# Patient Record
Sex: Male | Born: 1980
Health system: Southern US, Community
[De-identification: ages and names within clinical notes are randomized; demographics above are authoritative.]

## PROBLEM LIST (undated history)

## (undated) DIAGNOSIS — R51 Headache: Secondary | ICD-10-CM

## (undated) DIAGNOSIS — K219 Gastro-esophageal reflux disease without esophagitis: Secondary | ICD-10-CM

## (undated) DIAGNOSIS — D649 Anemia, unspecified: Secondary | ICD-10-CM

## (undated) DIAGNOSIS — N189 Chronic kidney disease, unspecified: Secondary | ICD-10-CM

## (undated) DIAGNOSIS — E1110 Type 2 diabetes mellitus with ketoacidosis without coma: Secondary | ICD-10-CM

## (undated) DIAGNOSIS — L0390 Cellulitis, unspecified: Secondary | ICD-10-CM

## (undated) DIAGNOSIS — E109 Type 1 diabetes mellitus without complications: Secondary | ICD-10-CM

## (undated) DIAGNOSIS — K759 Inflammatory liver disease, unspecified: Secondary | ICD-10-CM

## (undated) DIAGNOSIS — F419 Anxiety disorder, unspecified: Secondary | ICD-10-CM

## (undated) DIAGNOSIS — F1910 Other psychoactive substance abuse, uncomplicated: Secondary | ICD-10-CM

## (undated) DIAGNOSIS — J189 Pneumonia, unspecified organism: Secondary | ICD-10-CM

## (undated) DIAGNOSIS — A4902 Methicillin resistant Staphylococcus aureus infection, unspecified site: Secondary | ICD-10-CM

## (undated) DIAGNOSIS — M726 Necrotizing fasciitis: Secondary | ICD-10-CM

## (undated) DIAGNOSIS — I10 Essential (primary) hypertension: Secondary | ICD-10-CM

## (undated) DIAGNOSIS — E039 Hypothyroidism, unspecified: Secondary | ICD-10-CM

## (undated) DIAGNOSIS — A419 Sepsis, unspecified organism: Secondary | ICD-10-CM

## (undated) DIAGNOSIS — R011 Cardiac murmur, unspecified: Secondary | ICD-10-CM

## (undated) DIAGNOSIS — E119 Type 2 diabetes mellitus without complications: Secondary | ICD-10-CM

## (undated) DIAGNOSIS — R569 Unspecified convulsions: Secondary | ICD-10-CM

## (undated) HISTORY — DX: Other psychoactive substance abuse, uncomplicated: F19.10

## (undated) HISTORY — DX: Anemia, unspecified: D64.9

## (undated) HISTORY — PX: IRRIGATION AND DEBRIDEMENT ABSCESS: SHX5252

---

## 2009-03-19 DIAGNOSIS — R569 Unspecified convulsions: Secondary | ICD-10-CM

## 2009-03-19 HISTORY — DX: Unspecified convulsions: R56.9

## 2011-03-20 DIAGNOSIS — F1910 Other psychoactive substance abuse, uncomplicated: Secondary | ICD-10-CM

## 2011-03-20 HISTORY — DX: Other psychoactive substance abuse, uncomplicated: F19.10

## 2012-01-04 ENCOUNTER — Emergency Department (HOSPITAL_COMMUNITY)
Admission: EM | Admit: 2012-01-04 | Discharge: 2012-01-04 | Disposition: A | Discharge status: Home / Self Care | Payer: BC Managed Care – PPO | Attending: Emergency Medicine | Admitting: Emergency Medicine

## 2012-01-04 ENCOUNTER — Encounter (HOSPITAL_COMMUNITY): Payer: Self-pay

## 2012-01-04 DIAGNOSIS — IMO0002 Reserved for concepts with insufficient information to code with codable children: Secondary | ICD-10-CM | POA: Insufficient documentation

## 2012-01-04 DIAGNOSIS — F1910 Other psychoactive substance abuse, uncomplicated: Secondary | ICD-10-CM | POA: Insufficient documentation

## 2012-01-04 DIAGNOSIS — F17200 Nicotine dependence, unspecified, uncomplicated: Secondary | ICD-10-CM | POA: Insufficient documentation

## 2012-01-04 DIAGNOSIS — Z794 Long term (current) use of insulin: Secondary | ICD-10-CM | POA: Insufficient documentation

## 2012-01-04 DIAGNOSIS — R739 Hyperglycemia, unspecified: Secondary | ICD-10-CM

## 2012-01-04 DIAGNOSIS — E119 Type 2 diabetes mellitus without complications: Secondary | ICD-10-CM | POA: Insufficient documentation

## 2012-01-04 DIAGNOSIS — L02413 Cutaneous abscess of right upper limb: Secondary | ICD-10-CM

## 2012-01-04 HISTORY — DX: Type 2 diabetes mellitus without complications: E11.9

## 2012-01-04 LAB — GLUCOSE, CAPILLARY

## 2012-01-04 MED ORDER — OXYCODONE-ACETAMINOPHEN 5-325 MG PO TABS
1.0000 | ORAL_TABLET | Freq: Once | ORAL | Status: AC
  Administered 2012-01-04: 1 via ORAL
  Filled 2012-01-04: qty 1

## 2012-01-04 MED ORDER — CLINDAMYCIN HCL 150 MG PO CAPS: 300.0000 mg | ORAL_CAPSULE | Freq: Four times a day (QID) | ORAL | Status: DC

## 2012-01-04 MED ORDER — CLINDAMYCIN HCL 150 MG PO CAPS
300.0000 mg | ORAL_CAPSULE | Freq: Once | ORAL | Status: AC
  Administered 2012-01-04: 300 mg via ORAL
  Filled 2012-01-04 (×2): qty 1

## 2012-01-04 NOTE — ED Provider Notes (Addendum)
History  This chart was scribed for Luke Norrie, MD by Lovena Le Day. This patient was seen in room TR09C/TR09C and the patient's care was started at 1207.   CSN: [id number]  Arrival date & time 01/04/12  1207   None     Chief Complaint  Patient presents with  . Abscess   The history is provided by the patient. No language interpreter was used.   Luke [name] is a 31 y.o. male who presents to the Emergency Department complaining of constant gradually worsening abscess to right forearm without any drainage associated with shooting opana into his right AC last weekend ago with swelling which began 2 days after. He denies fever or chills, chest pain, SOB. He has been taking opana for 4-5 months for back problems. PT states he got drunk at his 31 yo GM's birthday and "people were having fun".  States his wife is a Lawyer and his mother is a Marine scientist.   His PCP is Dr. Harrie Foreman at [facility]   Past Medical History  Diagnosis Date  . Diabetes mellitus without complication     History reviewed. No pertinent past surgical history.  History reviewed. No pertinent family history.  History  Substance Use Topics  . Smoking status: Current Every Day Smoker -- 1.0 packs/day  . Smokeless tobacco: Not on file  . Alcohol Use: Yes   On disability   Review of Systems  Constitutional: Negative for fever and chills.  Respiratory: Negative for shortness of breath.   Cardiovascular: Negative for chest pain.  Gastrointestinal: Negative for nausea and vomiting.  Skin:       Abscess right forearm, swelling, redness, no drainage  Neurological: Negative for weakness.  All other systems reviewed and are negative.    Allergies  Review of patient's allergies indicates no known allergies.  Home Medications   Current Outpatient Rx  Name Route Sig Dispense Refill  . ALPRAZOLAM 2 MG PO TABS Oral Take 3 mg by mouth at bedtime.    . IBUPROFEN 200 MG PO TABS Oral Take 600 mg by mouth  every 6 (six) hours as needed. For pain    . INSULIN ASPART 100 UNIT/ML [place] SOLN Subcutaneous Inject 5 Units into the skin 3 (three) times daily as needed. For high cbg    . INSULIN ASPART PROT & ASPART (70-30) 100 UNIT/ML [place] SUSP Subcutaneous Inject 40-50 Units into the skin 2 (two) times daily. 50 units in the morning and 40 units at night    . OXYMORPHONE HCL ER 20 MG PO TB12 Oral Take 20 mg by mouth every 12 (twelve) hours.      Triage vitals: BP 145/89  Pulse 100  Temp 98.9 F (37.2 C) (Oral)  Resp 18  Ht 5\' 9"  (1.753 m)  Wt 190 lb (86.183 kg)  BMI 28.06 kg/m2  SpO2 100%  Vital signs normal    Physical Exam  Nursing note and vitals reviewed. Constitutional: He is oriented to person, place, and time. He appears well-developed and well-nourished.  Non-toxic appearance. He does not appear ill. No distress.  HENT:  Head: Normocephalic and atraumatic.  Right Ear: External ear normal.  Left Ear: External ear normal.  Nose: Nose normal. No mucosal edema or rhinorrhea.  Mouth/Throat: Oropharynx is clear and moist and mucous membranes are normal. No dental abscesses or uvula swelling.  Eyes: Conjunctivae normal and EOM are normal. Pupils are equal, round, and reactive to light.  Neck: Normal range of motion and  full passive range of motion without pain. Neck supple.  Cardiovascular: Normal rate, regular rhythm, normal heart sounds and intact distal pulses.  Exam reveals no gallop and no friction rub.   No murmur heard.      NO MURMERS!  Pulmonary/Chest: Effort normal. No respiratory distress. He has no wheezes. He has no rhonchi. He has no rales. He exhibits no crepitus.  Abdominal: Normal appearance.  Musculoskeletal: Normal range of motion. He exhibits no edema and no tenderness.       Moves all extremities well.   Neurological: He is alert and oriented to person, place, and time. He has normal strength. No cranial nerve deficit.  Skin: Skin is warm, dry and intact. No rash noted.  There is erythema. No pallor.       3x4 cm swollen, red, raised, erythematous area over proximal right forearm volar aspect.  Pt has track marks that appear scarred in the same area as well as fresh needle marks in his antecubital region.    Psychiatric: He has a normal mood and affect. His speech is normal and behavior is normal. His mood appears not anxious.    ED Course  Procedures (including critical care time)  Medications  oxyCODONE-acetaminophen (PERCOCET/ROXICET) 5-325 MG per tablet 1 tablet (not administered)  clindamycin (CLEOCIN) capsule 300 mg (not administered)    DIAGNOSTIC STUDIES: Oxygen Saturation is 100% on room air, normal by my interpretation.    COORDINATION OF CARE: At 51 PM Discussed treatment plan with patient which includes CBG, I&D right forearm. Patient agrees.  Pt swears he isn't going to do this (IV drugs)  anymore.  INCISION AND DRAINAGE Performed by: Rolland Porter L Consent: Verbal consent obtained. Risks and benefits: risks, benefits and alternatives were discussed Type: abscess  Body area: right forearm  Anesthesia: local infiltration  Local anesthetic: lidocaine 2%+  1 epinephrine  Anesthetic total: 3 ml  Complexity: complex Blunt dissection to break up loculations  Drainage: purulent  Drainage amount: moderate  Packing material: 1/4 in iodoform gauze small amount to keep wound open  Patient tolerance: Patient tolerated the procedure well with no immediate complications.    Review of [place] controlled substance site verified patient has been on Opana since May prescribed by Dr. Carolyne Fiscal. He gets 60 tablets monthly of the ER 20 mg tablets   Labs Reviewed  GLUCOSE, CAPILLARY - Abnormal; Notable for the following:    Glucose-Capillary 309 (*)     All other components within normal limits   Laboratory interpretation all normal except for hyperglycemia, patient states he is normally in this range. He states all last year he  was in the 600s, he states his doctor doesn't want him any lower than the 200s.   1. Abscess of forearm, right   2. IV drug abuse   3. Hyperglycemia    New Prescriptions   CLINDAMYCIN (CLEOCIN) 150 MG CAPSULE    Take 2 capsules (300 mg total) by mouth 4 (four) times daily.    Plan discharge  Rolland Porter, MD, FACEP    MDM  I personally performed the services described in this documentation, which was scribed in my presence. The recorded information has been reviewed and considered.  Rolland Porter, MD, FACEP          Luke Norrie, MD 01/04/12 [place] Arif Amendola, MD 01/04/12 [id number]

## 2012-01-04 NOTE — ED Notes (Signed)
Pt presents with 1 week h/o abscess to R forearm.  Pt denies any drainage to area.

## 2012-04-28 ENCOUNTER — Emergency Department (HOSPITAL_COMMUNITY)
Admission: EM | Admit: 2012-04-28 | Discharge: 2012-04-28 | Disposition: A | Discharge status: Home / Self Care | Payer: Self-pay | Source: Home / Self Care | Attending: Family Medicine | Admitting: Family Medicine

## 2012-04-28 ENCOUNTER — Encounter (HOSPITAL_COMMUNITY): Payer: Self-pay

## 2012-04-28 DIAGNOSIS — E109 Type 1 diabetes mellitus without complications: Secondary | ICD-10-CM

## 2012-04-28 DIAGNOSIS — Z720 Tobacco use: Secondary | ICD-10-CM

## 2012-04-28 DIAGNOSIS — E1140 Type 2 diabetes mellitus with diabetic neuropathy, unspecified: Secondary | ICD-10-CM

## 2012-04-28 NOTE — ED Provider Notes (Signed)
History   CSN: [id number]  Arrival date & time 04/28/12  1728   First MD Initiated Contact with Patient 04/28/12 1740     Chief Complaint  Patient presents with  . Diabetes   HPI Pt says that he take 50 units of novolog 70/30 at breakfast and 40 units with supper, and novolog sliding scale reporting that he takes 5 units before meals.  Pt says that he has been going to [facility].  Pt says that he has been getting his pain and anxiety medications from that clinic.   Pt says that he does not have any testing strips for his diabetes medications.  Pt says that he takes his regular doses and estimates how much sliding scale insulin to take based on how he feels.   Pt says that his A1c is 13.4%. Pt says that he is here to get a discount card so that he can have assistance with getting his medications.      Past Medical History  Diagnosis Date  . Diabetes mellitus without complication     History reviewed. No pertinent past surgical history.  Family history Diabetes Hypertension Cancer - lung Maternal Grandparents Heart Disease  Dad died at age 27 of massive Heart Attack  History  Substance Use Topics  . Smoking status: Current Every Day Smoker -- 1.00 packs/day  . Smokeless tobacco: Not on file  . Alcohol Use: Yes    Review of Systems  Genitourinary: Positive for urgency and frequency.       Nocturia   All other systems reviewed and are negative.    Allergies  Review of patient's allergies indicates no known allergies.  Home Medications   Current Outpatient Rx  Name  Route  Sig  Dispense  Refill  . alprazolam (XANAX) 2 MG tablet   Oral   Take 3 mg by mouth at bedtime.         . clindamycin (CLEOCIN) 150 MG capsule   Oral   Take 2 capsules (300 mg total) by mouth 4 (four) times daily.   80 capsule   0   . ibuprofen (ADVIL,MOTRIN) 200 MG tablet   Oral   Take 600 mg by mouth every 6 (six) hours as needed. For pain         . insulin aspart (NOVOLOG) 100  UNIT/ML injection   Subcutaneous   Inject 5 Units into the skin 3 (three) times daily as needed. For high cbg         . insulin aspart protamine-insulin aspart (NOVOLOG 70/30) (70-30) 100 UNIT/ML injection   Subcutaneous   Inject 40-50 Units into the skin 2 (two) times daily. 50 units in the morning and 40 units at night         . oxymorphone (OPANA ER) 20 MG 12 hr tablet   Oral   Take 20 mg by mouth every 12 (twelve) hours.           BP 162/94  Pulse 107  Temp(Src) 97.7 F (36.5 C) (Oral)  Physical Exam  Nursing note and vitals reviewed. Constitutional: He is oriented to person, place, and time. He appears well-developed and well-nourished. No distress.  HENT:  Head: Normocephalic and atraumatic.  Right Ear: External ear normal.  Left Ear: External ear normal.  Nose: Mucosal edema present.  Mouth/Throat: Abnormal dentition. Dental caries present.  Eyes: Conjunctivae and EOM are normal. Pupils are equal, round, and reactive to light.  Neck: Normal range of motion. Neck supple. No JVD  present. No tracheal deviation present. No thyromegaly present.  Cardiovascular: Normal rate, regular rhythm and normal heart sounds.   No murmur heard. Pulmonary/Chest: Effort normal and breath sounds normal.  Abdominal: Soft. Bowel sounds are normal. He exhibits no distension and no mass. There is no tenderness. There is no rebound and no guarding.  Musculoskeletal: Normal range of motion. He exhibits no edema and no tenderness.  Lymphadenopathy:    He has no cervical adenopathy.  Neurological: He is alert and oriented to person, place, and time.  Skin: Skin is warm and dry. No rash noted. No erythema. No pallor.  Psychiatric: He has a normal mood and affect. His behavior is normal. Judgment and thought content normal.    ED Course  Procedures (including critical care time)  Labs Reviewed - No data to display No results found.   No diagnosis found.  MDM   IMPRESSION  Uncontrolled Diabetes Mellitus, type 1, poorly controlled  Chronic Active Nicotine Dependence  Chronic Pain Disorder  Anxiety Disorder  Elevated BP   History of recreational drug use    RECOMMENDATIONS / PLAN Check labs today.  I had a discussion with patient today.  He is at [place] of ACUTE and CHRONIC complications of poorly controlled diabetes mellitus. The patient verbalized understanding.   The patient was counseled on the dangers of tobacco use, and was advised to quit.  Reviewed strategies to maximize success, including removing cigarettes and smoking materials from environment, stress management, substitution of other forms of reinforcement and support of family/friends and written materials given. Strongly encouraged pt to get a discount card so that he can get his insulin and testing strips. Follow up in 2 weeks for BP checks Pt says that he has refills on his Novolog 70/30 insulin and did not want to get his labs done today.  The patient says that he will RTC in 2 weeks to have his labs done.  I encouraged patient to get Relion Meter and Test Strips from [employer] and to start testing his BS 4 times per day.  The patient verbalized understanding.  Obtain Old Records.  Pt says that he would prefer to go back to his previous clinic because they were providing him with his pain medications.     FOLLOW UP 2 weeks for BP check up   The patient was given clear instructions to go to ER or return to medical center if symptoms don't improve, worsen or new problems develop.  The patient verbalized understanding.  The patient was told to call to get lab results if they haven't heard anything in the next week.            Murlean Iba, MD 04/28/12 [phone]

## 2012-04-28 NOTE — ED Notes (Signed)
Patient states is a diabetic and needs insulin

## 2012-10-10 ENCOUNTER — Encounter (HOSPITAL_COMMUNITY): Payer: Self-pay | Admitting: *Deleted

## 2012-10-10 ENCOUNTER — Emergency Department (HOSPITAL_COMMUNITY)
Admission: EM | Admit: 2012-10-10 | Discharge: 2012-10-10 | Discharge status: Against Medical Advice | Payer: Medicaid Other | Attending: Emergency Medicine | Admitting: Emergency Medicine

## 2012-10-10 DIAGNOSIS — M3313 Other dermatomyositis without myopathy: Secondary | ICD-10-CM | POA: Diagnosis present

## 2012-10-10 DIAGNOSIS — R61 Generalized hyperhidrosis: Secondary | ICD-10-CM | POA: Insufficient documentation

## 2012-10-10 DIAGNOSIS — R0789 Other chest pain: Secondary | ICD-10-CM | POA: Insufficient documentation

## 2012-10-10 DIAGNOSIS — R110 Nausea: Secondary | ICD-10-CM | POA: Insufficient documentation

## 2012-10-10 DIAGNOSIS — Z79899 Other long term (current) drug therapy: Secondary | ICD-10-CM | POA: Insufficient documentation

## 2012-10-10 DIAGNOSIS — Z794 Long term (current) use of insulin: Secondary | ICD-10-CM | POA: Insufficient documentation

## 2012-10-10 DIAGNOSIS — F1410 Cocaine abuse, uncomplicated: Secondary | ICD-10-CM | POA: Diagnosis present

## 2012-10-10 DIAGNOSIS — F17200 Nicotine dependence, unspecified, uncomplicated: Secondary | ICD-10-CM | POA: Insufficient documentation

## 2012-10-10 DIAGNOSIS — B9689 Other specified bacterial agents as the cause of diseases classified elsewhere: Secondary | ICD-10-CM | POA: Diagnosis present

## 2012-10-10 DIAGNOSIS — R509 Fever, unspecified: Secondary | ICD-10-CM | POA: Insufficient documentation

## 2012-10-10 DIAGNOSIS — E119 Type 2 diabetes mellitus without complications: Secondary | ICD-10-CM | POA: Insufficient documentation

## 2012-10-10 DIAGNOSIS — IMO0002 Reserved for concepts with insufficient information to code with codable children: Principal | ICD-10-CM | POA: Diagnosis present

## 2012-10-10 DIAGNOSIS — L0291 Cutaneous abscess, unspecified: Secondary | ICD-10-CM

## 2012-10-10 DIAGNOSIS — L0390 Cellulitis, unspecified: Secondary | ICD-10-CM | POA: Diagnosis present

## 2012-10-10 DIAGNOSIS — N179 Acute kidney failure, unspecified: Secondary | ICD-10-CM | POA: Diagnosis present

## 2012-10-10 DIAGNOSIS — E871 Hypo-osmolality and hyponatremia: Secondary | ICD-10-CM | POA: Diagnosis present

## 2012-10-10 DIAGNOSIS — M3390 Dermatopolymyositis, unspecified, organ involvement unspecified: Secondary | ICD-10-CM | POA: Diagnosis present

## 2012-10-10 DIAGNOSIS — M2559 Pain in other specified joint: Secondary | ICD-10-CM | POA: Insufficient documentation

## 2012-10-10 DIAGNOSIS — R21 Rash and other nonspecific skin eruption: Secondary | ICD-10-CM | POA: Insufficient documentation

## 2012-10-10 DIAGNOSIS — I10 Essential (primary) hypertension: Secondary | ICD-10-CM | POA: Diagnosis present

## 2012-10-10 MED ORDER — SODIUM CHLORIDE 0.9 % IV BOLUS (SEPSIS): 1000.0000 mL | Freq: Once | INTRAVENOUS | Status: DC

## 2012-10-10 NOTE — ED Notes (Signed)
Pt reports having abscess to left arm for extended amount of time but it became more severe this week. Has redness and swelling noted to forearm and bend of left arm.

## 2012-10-10 NOTE — ED Provider Notes (Signed)
CSN: [id number]     Arrival date & time 10/10/12  1759 History     First MD Initiated Contact with Patient 10/10/12 2105     Chief Complaint  Patient presents with  . Abscess   (Consider location/radiation/quality/duration/timing/severity/associated sxs/prior Treatment) HPI Luke [name] 32 y.o. who is a known IV drug abuser and a poorly controlled diabetic presents emergency department for left-sided forearm pain and swelling. He describes it as "I have an abscess". It's associated with surrounding redness and warmth. It started approximately 2 weeks ago. Patient reports relapsing with IV drugs around the same time. Pain is been worsening. He denies limited range of motion of his elbow due to pain. He now notes tightness within his forearm as well. This is associated with a fever documented 4 days ago of 104F. Patient is also had chills, and diaphoresis. He reports having abscesses in the past, but denies needing hospitalization for these. He has not sought medical care up to this point and time. He denies chest pain, shortness of breath, headache, syncope.  Past Medical History  Diagnosis Date  . Diabetes mellitus without complication    History reviewed. No pertinent past surgical history. History reviewed. No pertinent family history. History  Substance Use Topics  . Smoking status: Current Every Day Smoker -- 1.00 packs/day  . Smokeless tobacco: Not on file  . Alcohol Use: Yes    Review of Systems  Constitutional: Positive for fever, chills and diaphoresis. Negative for activity change, appetite change and fatigue.  HENT: Negative for congestion, sore throat, rhinorrhea, sneezing and trouble swallowing.   Eyes: Negative for pain and redness.  Respiratory: Positive for chest tightness. Negative for cough, choking, shortness of breath, wheezing and stridor.   Cardiovascular: Positive for chest pain. Negative for leg swelling.  Gastrointestinal: Positive for nausea. Negative for  vomiting, diarrhea, constipation, blood in stool, abdominal distention and anal bleeding.  Musculoskeletal: Positive for arthralgias (Left elbow). Negative for myalgias and back pain.  Skin: Positive for rash (left proximal for).  Neurological: Negative for dizziness, speech difficulty, weakness, light-headedness, numbness and headaches.  Hematological: Negative for adenopathy.  Psychiatric/Behavioral: Negative for confusion.    Allergies  Review of patient's allergies indicates no known allergies.  Home Medications   Current Outpatient Rx  Name  Route  Sig  Dispense  Refill  . alprazolam (XANAX) 2 MG tablet   Oral   Take 3 mg by mouth at bedtime.         Marland Kitchen ibuprofen (ADVIL,MOTRIN) 200 MG tablet   Oral   Take 600 mg by mouth every 6 (six) hours as needed. For pain         . insulin aspart (NOVOLOG) 100 UNIT/ML injection   Subcutaneous   Inject 5 Units into the skin 3 (three) times daily as needed for high blood sugar (pt on a sliding scale).          . insulin aspart protamine-insulin aspart (NOVOLOG 70/30) (70-30) 100 UNIT/ML injection   Subcutaneous   Inject 40-50 Units into the skin 2 (two) times daily. 55 units in the morning and 45 units at night         . lisinopril (PRINIVIL,ZESTRIL) 10 MG tablet   Oral   Take 10 mg by mouth daily.         Marland Kitchen oxymorphone (OPANA ER) 20 MG 12 hr tablet   Oral   Take 20 mg by mouth every 12 (twelve) hours.  BP 141/73  Pulse 120  Temp(Src) 100.1 F (37.8 C) (Oral)  Resp 18  SpO2 98% Physical Exam  Nursing note and vitals reviewed. Constitutional: He is oriented to person, place, and time. He appears well-developed and well-nourished. No distress.  HENT:  Head: Normocephalic and atraumatic.  Eyes: Conjunctivae and EOM are normal. Right eye exhibits no discharge. Left eye exhibits no discharge.  Neck: Normal range of motion. Neck supple. No tracheal deviation present.  Cardiovascular: Regular rhythm and normal  heart sounds.  Tachycardia present.  Exam reveals no friction rub.   No murmur heard. Pulmonary/Chest: Effort normal and breath sounds normal. No stridor. No respiratory distress. He has no wheezes. He has no rales. He exhibits no tenderness.  Abdominal: Soft. He exhibits no distension. There is no tenderness. There is no rebound and no guarding.  Musculoskeletal:       Left elbow: He exhibits decreased range of motion. He exhibits no laceration. No tenderness found. No radial head, no medial epicondyle, no lateral epicondyle and no olecranon process tenderness noted.       Arms: There is a large indurated area that is erythematous on the proximal aspect of the anterior forearm. It is at least 15 cm. It is tender to palpation. There is surrounding erythema extending proximally and distally. There is tenderness palpation of the entire forearm. Can only minimally grip due to pain.  Neurological: He is alert and oriented to person, place, and time.  Skin: Skin is warm. He is diaphoretic. There is erythema (There is a large indurated area that is erythematous on the proximal aspect of the anterior forearm. It is at least 15 cm. It is tender to palpation. There is surrounding erythema extending proximally and distally. forearm compartment is tight. . it is war).  Psychiatric: He has a normal mood and affect.    ED Course   Procedures (including critical care time)  No diagnosis found.  MDM  Luke [name] 32 y.o. presents with appears to be an abscess underlying cellulitis, and also some concern for compartment syndrome of his left forearm. He is a known IV drug abuser and a poorly controlled diabetic. This is also associated with fever, chills, diaphoresis. I'm very concerned for abscess underlying cellulitis, but cannot rule out necrotizing fasciitis versus a septic joint versus sepsis. I told the patient at bedside bed admission would be required for IV antibiotics and patient indicated that he  needed to go home to put some orders and affairs he has 5 children and wife. He specifically said he needs to give the car back to his wife. I explained to the patient in detail the necessity of urgent IV antibiotics in the potential risks of not having IV antibiotics. These risks included worsening infection, infection in the joint leading to permanent destruction of the joint, sepsis, endocarditis, necrotizing fasciitis which could lead to require surgical debridement, permanent disability, or death. The patient understood these risks, and indicated he would return at later time.  I discussed this patient's care with my attending, Dr. Kathrynn Humble.  Kelby Aline, MD 10/11/12 [id number]  Kelby Aline, MD 10/11/12 0028  I performed a history and physical examination of  Luke [name] and discussed his management with Dr. Lucrezia Starch. I agree with the history, physical, assessment, and plan of care, with the following exceptions: None I was present for the following procedures: None  Time Spent in Critical Care of the patient: None  Time spent in discussions with the patient and family:  20 minutes  Naje Rice  Pt with suspected abscess of the forearm. Wants to go home, and return immediately.  Patient wants to leave against medical advice. Patient understands that his/her actions will lead to inadequate medical workup, and that he/she is at risk of complications of missed diagnosis, which includes morbidity and mortality.  Patient is demonstrating good capacity to make decision. Patient understands that he/she needs to return to the ER immediately if his/her symptoms get worse.    Varney Biles, MD 10/13/12 [phone]

## 2012-10-11 ENCOUNTER — Inpatient Hospital Stay (HOSPITAL_COMMUNITY): Payer: Medicaid Other | Admitting: Anesthesiology

## 2012-10-11 ENCOUNTER — Encounter (HOSPITAL_COMMUNITY): Payer: Self-pay | Admitting: Anesthesiology

## 2012-10-11 ENCOUNTER — Encounter (HOSPITAL_COMMUNITY): Payer: Self-pay | Admitting: *Deleted

## 2012-10-11 ENCOUNTER — Encounter (HOSPITAL_COMMUNITY)
Admission: EM | Discharge status: Against Medical Advice | Payer: Self-pay | Source: Home / Self Care | Attending: Internal Medicine

## 2012-10-11 ENCOUNTER — Inpatient Hospital Stay (HOSPITAL_COMMUNITY)
Admission: EM | Admit: 2012-10-11 | Discharge: 2012-10-13 | DRG: 580 | Discharge status: Against Medical Advice | Payer: Medicaid Other | Attending: Internal Medicine | Admitting: Internal Medicine

## 2012-10-11 DIAGNOSIS — L0390 Cellulitis, unspecified: Secondary | ICD-10-CM | POA: Diagnosis present

## 2012-10-11 DIAGNOSIS — M3390 Dermatopolymyositis, unspecified, organ involvement unspecified: Secondary | ICD-10-CM

## 2012-10-11 DIAGNOSIS — N179 Acute kidney failure, unspecified: Secondary | ICD-10-CM

## 2012-10-11 DIAGNOSIS — L02414 Cutaneous abscess of left upper limb: Secondary | ICD-10-CM

## 2012-10-11 DIAGNOSIS — I10 Essential (primary) hypertension: Secondary | ICD-10-CM | POA: Diagnosis present

## 2012-10-11 DIAGNOSIS — Z720 Tobacco use: Secondary | ICD-10-CM | POA: Diagnosis present

## 2012-10-11 DIAGNOSIS — L0291 Cutaneous abscess, unspecified: Secondary | ICD-10-CM | POA: Diagnosis present

## 2012-10-11 DIAGNOSIS — F1910 Other psychoactive substance abuse, uncomplicated: Secondary | ICD-10-CM | POA: Diagnosis present

## 2012-10-11 HISTORY — PX: I & D EXTREMITY: SHX5045

## 2012-10-11 LAB — GLUCOSE, CAPILLARY
Glucose-Capillary: 306 mg/dL — ABNORMAL HIGH (ref 70–99)
Glucose-Capillary: 258 mg/dL — ABNORMAL HIGH (ref 70–99)
Glucose-Capillary: 288 mg/dL — ABNORMAL HIGH (ref 70–99)

## 2012-10-11 LAB — POCT I-STAT, CHEM 8
BUN: 15 mg/dL (ref 6–23)
Chloride: 97 mEq/L (ref 96–112)
Creatinine, Ser: 1.6 mg/dL — ABNORMAL HIGH (ref 0.50–1.35)
Potassium: 3.9 mEq/L (ref 3.5–5.1)
Sodium: 131 mEq/L — ABNORMAL LOW (ref 135–145)

## 2012-10-11 LAB — CBC WITH DIFFERENTIAL/PLATELET
Basophils Relative: 0 % (ref 0–1)
Eosinophils Relative: 0 % (ref 0–5)
Hemoglobin: 13.1 g/dL (ref 13.0–17.0)
Lymphocytes Relative: 10 % — ABNORMAL LOW (ref 12–46)
MCH: 30.2 pg (ref 26.0–34.0)
Monocytes Relative: 7 % (ref 3–12)
Neutrophils Relative %: 83 % — ABNORMAL HIGH (ref 43–77)
RBC: 4.34 MIL/uL (ref 4.22–5.81)

## 2012-10-11 SURGERY — IRRIGATION AND DEBRIDEMENT EXTREMITY
Anesthesia: General | Site: Arm Lower | Laterality: Left | Wound class: Dirty or Infected

## 2012-10-11 MED ORDER — ALPRAZOLAM 1 MG PO TABS
1.0000 mg | ORAL_TABLET | Freq: Two times a day (BID) | ORAL | Status: DC | PRN
  Administered 2012-10-12: 2 mg via ORAL
  Filled 2012-10-11: qty 4

## 2012-10-11 MED ORDER — HYDROMORPHONE HCL PF 2 MG/ML IJ SOLN
2.0000 mg | INTRAMUSCULAR | Status: AC
  Administered 2012-10-11: 2 mg via INTRAVENOUS
  Filled 2012-10-11: qty 1

## 2012-10-11 MED ORDER — PROMETHAZINE HCL 25 MG/ML IJ SOLN
INTRAMUSCULAR | Status: AC
  Filled 2012-10-11: qty 1

## 2012-10-11 MED ORDER — MIDAZOLAM HCL 5 MG/5ML IJ SOLN
INTRAMUSCULAR | Status: DC | PRN
  Administered 2012-10-11: 2 mg via INTRAVENOUS

## 2012-10-11 MED ORDER — VANCOMYCIN HCL IN DEXTROSE 1-5 GM/200ML-% IV SOLN
1000.0000 mg | Freq: Three times a day (TID) | INTRAVENOUS | Status: DC
  Administered 2012-10-11 – 2012-10-13 (×6): 1000 mg via INTRAVENOUS
  Filled 2012-10-11 (×9): qty 200

## 2012-10-11 MED ORDER — PROMETHAZINE HCL 25 MG/ML IJ SOLN
6.2500 mg | INTRAMUSCULAR | Status: DC | PRN
  Administered 2012-10-11: 12.5 mg via INTRAVENOUS

## 2012-10-11 MED ORDER — SODIUM CHLORIDE 0.9 % IR SOLN
Status: DC | PRN
  Administered 2012-10-11: 11:00:00

## 2012-10-11 MED ORDER — ONDANSETRON HCL 4 MG PO TABS: 4.0000 mg | ORAL_TABLET | Freq: Four times a day (QID) | ORAL | Status: DC | PRN

## 2012-10-11 MED ORDER — SUCCINYLCHOLINE CHLORIDE 20 MG/ML IJ SOLN
INTRAMUSCULAR | Status: DC | PRN
  Administered 2012-10-11: 100 mg via INTRAVENOUS

## 2012-10-11 MED ORDER — HYDROMORPHONE HCL PF 1 MG/ML IJ SOLN
INTRAMUSCULAR | Status: AC
  Filled 2012-10-11: qty 2

## 2012-10-11 MED ORDER — ACETAMINOPHEN 500 MG PO TABS
500.0000 mg | ORAL_TABLET | Freq: Once | ORAL | Status: AC
  Administered 2012-10-11: 500 mg via ORAL
  Filled 2012-10-11: qty 1

## 2012-10-11 MED ORDER — SODIUM CHLORIDE 0.9 % IV BOLUS (SEPSIS)
1000.0000 mL | Freq: Once | INTRAVENOUS | Status: AC
  Administered 2012-10-11: 1000 mL via INTRAVENOUS

## 2012-10-11 MED ORDER — FENTANYL CITRATE 0.05 MG/ML IJ SOLN
INTRAMUSCULAR | Status: DC | PRN
  Administered 2012-10-11: 100 ug via INTRAVENOUS
  Administered 2012-10-11 (×3): 50 ug via INTRAVENOUS

## 2012-10-11 MED ORDER — INSULIN ASPART PROT & ASPART (70-30 MIX) 100 UNIT/ML ~~LOC~~ SUSP
55.0000 [IU] | Freq: Every day | SUBCUTANEOUS | Status: DC
  Administered 2012-10-12: 55 [IU] via SUBCUTANEOUS
  Filled 2012-10-11: qty 10

## 2012-10-11 MED ORDER — CHLORHEXIDINE GLUCONATE 4 % EX LIQD: 60.0000 mL | Freq: Once | CUTANEOUS | Status: DC

## 2012-10-11 MED ORDER — INSULIN ASPART PROT & ASPART (70-30 MIX) 100 UNIT/ML ~~LOC~~ SUSP
45.0000 [IU] | Freq: Every day | SUBCUTANEOUS | Status: DC
  Administered 2012-10-11 – 2012-10-12 (×2): 45 [IU] via SUBCUTANEOUS

## 2012-10-11 MED ORDER — INSULIN ASPART PROT & ASPART (70-30 MIX) 100 UNIT/ML ~~LOC~~ SUSP: 45.0000 [IU] | Freq: Two times a day (BID) | SUBCUTANEOUS | Status: DC

## 2012-10-11 MED ORDER — DEXTROSE-NACL 5-0.9 % IV SOLN
INTRAVENOUS | Status: DC
  Administered 2012-10-11: 13:00:00 via INTRAVENOUS

## 2012-10-11 MED ORDER — LACTATED RINGERS IV SOLN
INTRAVENOUS | Status: DC | PRN
  Administered 2012-10-11 (×2): via INTRAVENOUS

## 2012-10-11 MED ORDER — INSULIN ASPART 100 UNIT/ML ~~LOC~~ SOLN: 4.0000 [IU] | Freq: Three times a day (TID) | SUBCUTANEOUS | Status: DC

## 2012-10-11 MED ORDER — INSULIN ASPART 100 UNIT/ML ~~LOC~~ SOLN
0.0000 [IU] | Freq: Three times a day (TID) | SUBCUTANEOUS | Status: DC
  Administered 2012-10-11: 8 [IU] via SUBCUTANEOUS
  Administered 2012-10-12: 11 [IU] via SUBCUTANEOUS
  Administered 2012-10-12: 3 [IU] via SUBCUTANEOUS

## 2012-10-11 MED ORDER — ALPRAZOLAM 0.5 MG PO TABS
3.0000 mg | ORAL_TABLET | Freq: Every day | ORAL | Status: DC
  Administered 2012-10-11 – 2012-10-12 (×2): 3 mg via ORAL
  Filled 2012-10-11 (×2): qty 6

## 2012-10-11 MED ORDER — SODIUM CHLORIDE 0.9 % IR SOLN
Status: DC | PRN
  Administered 2012-10-11: 3000 mL

## 2012-10-11 MED ORDER — ONDANSETRON HCL 4 MG/2ML IJ SOLN: 4.0000 mg | Freq: Four times a day (QID) | INTRAMUSCULAR | Status: DC | PRN

## 2012-10-11 MED ORDER — CIPROFLOXACIN IN D5W 400 MG/200ML IV SOLN
400.0000 mg | Freq: Two times a day (BID) | INTRAVENOUS | Status: DC
  Administered 2012-10-11 – 2012-10-12 (×4): 400 mg via INTRAVENOUS
  Filled 2012-10-11 (×6): qty 200

## 2012-10-11 MED ORDER — HEPARIN SODIUM (PORCINE) 5000 UNIT/ML IJ SOLN
5000.0000 [IU] | Freq: Three times a day (TID) | INTRAMUSCULAR | Status: DC
  Administered 2012-10-11 – 2012-10-13 (×5): 5000 [IU] via SUBCUTANEOUS
  Filled 2012-10-11 (×9): qty 1

## 2012-10-11 MED ORDER — HYDROMORPHONE HCL PF 1 MG/ML IJ SOLN
1.0000 mg | INTRAMUSCULAR | Status: DC | PRN
  Administered 2012-10-11 (×2): 2 mg via INTRAVENOUS
  Administered 2012-10-11: 1 mg via INTRAVENOUS
  Administered 2012-10-11 (×2): 2 mg via INTRAVENOUS
  Administered 2012-10-11 (×2): 1 mg via INTRAVENOUS
  Administered 2012-10-12 – 2012-10-13 (×7): 2 mg via INTRAVENOUS
  Administered 2012-10-13: 1 mg via INTRAVENOUS
  Administered 2012-10-13 (×3): 2 mg via INTRAVENOUS
  Filled 2012-10-11 (×7): qty 2
  Filled 2012-10-11: qty 1
  Filled 2012-10-11 (×2): qty 2
  Filled 2012-10-11: qty 1
  Filled 2012-10-11: qty 2
  Filled 2012-10-11: qty 1
  Filled 2012-10-11: qty 2
  Filled 2012-10-11: qty 1
  Filled 2012-10-11 (×3): qty 2

## 2012-10-11 MED ORDER — STERILE DILUENT FOR HUMULIN INSULINS: 20.0000 [IU] | Freq: Two times a day (BID) | SUBCUTANEOUS | Status: DC

## 2012-10-11 MED ORDER — INSULIN ASPART 100 UNIT/ML ~~LOC~~ SOLN
0.0000 [IU] | SUBCUTANEOUS | Status: DC
Start: 2012-10-11 — End: 2012-10-11
  Administered 2012-10-11: 11 [IU] via SUBCUTANEOUS
  Administered 2012-10-11: 15 [IU] via SUBCUTANEOUS

## 2012-10-11 MED ORDER — INSULIN ASPART 100 UNIT/ML ~~LOC~~ SOLN
SUBCUTANEOUS | Status: AC
  Filled 2012-10-11: qty 1

## 2012-10-11 MED ORDER — KETOROLAC TROMETHAMINE 30 MG/ML IJ SOLN
30.0000 mg | Freq: Four times a day (QID) | INTRAMUSCULAR | Status: DC
  Administered 2012-10-11 – 2012-10-13 (×6): 30 mg via INTRAVENOUS
  Filled 2012-10-11 (×14): qty 1

## 2012-10-11 MED ORDER — PROPOFOL 10 MG/ML IV BOLUS
INTRAVENOUS | Status: DC | PRN
  Administered 2012-10-11: 200 mg via INTRAVENOUS

## 2012-10-11 MED ORDER — INSULIN NPH (HUMAN) (ISOPHANE) 100 UNIT/ML ~~LOC~~ SUSP
20.0000 [IU] | Freq: Two times a day (BID) | SUBCUTANEOUS | Status: DC
  Filled 2012-10-11: qty 10

## 2012-10-11 MED ORDER — LIDOCAINE HCL (CARDIAC) 20 MG/ML IV SOLN
INTRAVENOUS | Status: DC | PRN
  Administered 2012-10-11: 60 mg via INTRAVENOUS

## 2012-10-11 MED ORDER — PANTOPRAZOLE SODIUM 40 MG PO TBEC
40.0000 mg | DELAYED_RELEASE_TABLET | Freq: Every day | ORAL | Status: DC
  Administered 2012-10-11 – 2012-10-13 (×3): 40 mg via ORAL
  Filled 2012-10-11 (×2): qty 1

## 2012-10-11 MED ORDER — SODIUM CHLORIDE 0.9 % IV SOLN
INTRAVENOUS | Status: DC
  Administered 2012-10-11 – 2012-10-12 (×2): via INTRAVENOUS

## 2012-10-11 MED ORDER — IBUPROFEN 200 MG PO TABS
400.0000 mg | ORAL_TABLET | Freq: Once | ORAL | Status: AC
  Administered 2012-10-11: 400 mg via ORAL
  Filled 2012-10-11: qty 2

## 2012-10-11 MED ORDER — VANCOMYCIN HCL IN DEXTROSE 1-5 GM/200ML-% IV SOLN
1000.0000 mg | Freq: Once | INTRAVENOUS | Status: AC
  Administered 2012-10-11: 1000 mg via INTRAVENOUS
  Filled 2012-10-11: qty 200

## 2012-10-11 MED ORDER — HYDROMORPHONE HCL PF 1 MG/ML IJ SOLN
0.2500 mg | INTRAMUSCULAR | Status: DC | PRN
  Administered 2012-10-11 (×4): 0.5 mg via INTRAVENOUS

## 2012-10-11 MED ORDER — LISINOPRIL 10 MG PO TABS
10.0000 mg | ORAL_TABLET | Freq: Every day | ORAL | Status: DC
  Filled 2012-10-11 (×3): qty 1

## 2012-10-11 MED ORDER — OXYCODONE HCL 5 MG PO TABS: 5.0000 mg | ORAL_TABLET | Freq: Once | ORAL | Status: DC | PRN

## 2012-10-11 MED ORDER — LIDOCAINE HCL 4 % MT SOLN
OROMUCOSAL | Status: DC | PRN
  Administered 2012-10-11: 4 mL via TOPICAL

## 2012-10-11 MED ORDER — OXYCODONE HCL 5 MG/5ML PO SOLN: 5.0000 mg | Freq: Once | ORAL | Status: DC | PRN

## 2012-10-11 MED ORDER — DOCUSATE SODIUM 100 MG PO CAPS
100.0000 mg | ORAL_CAPSULE | Freq: Two times a day (BID) | ORAL | Status: DC
  Administered 2012-10-11 – 2012-10-13 (×3): 100 mg via ORAL
  Filled 2012-10-11 (×6): qty 1

## 2012-10-11 SURGICAL SUPPLY — 51 items
BANDAGE CONFORM 2  STR LF (GAUZE/BANDAGES/DRESSINGS) IMPLANT
BANDAGE ELASTIC 3 VELCRO ST LF (GAUZE/BANDAGES/DRESSINGS) ×2 IMPLANT
BANDAGE ELASTIC 4 VELCRO ST LF (GAUZE/BANDAGES/DRESSINGS) ×2 IMPLANT
BANDAGE GAUZE ELAST BULKY 4 IN (GAUZE/BANDAGES/DRESSINGS) ×2 IMPLANT
BNDG COHESIVE 1X5 TAN STRL LF (GAUZE/BANDAGES/DRESSINGS) IMPLANT
BNDG ESMARK 4X9 LF (GAUZE/BANDAGES/DRESSINGS) ×2 IMPLANT
CLOTH BEACON ORANGE TIMEOUT ST (SAFETY) ×2 IMPLANT
CORDS BIPOLAR (ELECTRODE) IMPLANT
COVER SURGICAL LIGHT HANDLE (MISCELLANEOUS) ×2 IMPLANT
CUFF TOURNIQUET SINGLE 18IN (TOURNIQUET CUFF) ×2 IMPLANT
CUFF TOURNIQUET SINGLE 24IN (TOURNIQUET CUFF) IMPLANT
DRAIN PENROSE 1/4X12 LTX STRL (WOUND CARE) IMPLANT
DRAPE SURG 17X23 STRL (DRAPES) ×2 IMPLANT
DRSG ADAPTIC 3X8 NADH LF (GAUZE/BANDAGES/DRESSINGS) ×2 IMPLANT
ELECT REM PT RETURN 9FT ADLT (ELECTROSURGICAL) ×2
ELECTRODE REM PT RTRN 9FT ADLT (ELECTROSURGICAL) ×1 IMPLANT
GAUZE XEROFORM 1X8 LF (GAUZE/BANDAGES/DRESSINGS) ×2 IMPLANT
GAUZE XEROFORM 5X9 LF (GAUZE/BANDAGES/DRESSINGS) IMPLANT
GLOVE BIOGEL PI IND STRL 8.5 (GLOVE) ×1 IMPLANT
GLOVE BIOGEL PI INDICATOR 8.5 (GLOVE) ×1
GLOVE SURG ORTHO 8.0 STRL STRW (GLOVE) ×2 IMPLANT
GOWN PREVENTION PLUS XLARGE (GOWN DISPOSABLE) ×2 IMPLANT
GOWN STRL NON-REIN LRG LVL3 (GOWN DISPOSABLE) ×6 IMPLANT
HANDPIECE INTERPULSE COAX TIP (DISPOSABLE)
KIT BASIN OR (CUSTOM PROCEDURE TRAY) ×2 IMPLANT
KIT ROOM TURNOVER OR (KITS) ×2 IMPLANT
MANIFOLD NEPTUNE II (INSTRUMENTS) ×2 IMPLANT
NEEDLE HYPO 25GX1X1/2 BEV (NEEDLE) IMPLANT
NS IRRIG 1000ML POUR BTL (IV SOLUTION) ×2 IMPLANT
PACK ORTHO EXTREMITY (CUSTOM PROCEDURE TRAY) ×2 IMPLANT
PAD ARMBOARD 7.5X6 YLW CONV (MISCELLANEOUS) ×4 IMPLANT
PAD CAST 4YDX4 CTTN HI CHSV (CAST SUPPLIES) ×1 IMPLANT
PADDING CAST COTTON 4X4 STRL (CAST SUPPLIES) ×1
SET HNDPC FAN SPRY TIP SCT (DISPOSABLE) IMPLANT
SOAP 2 % CHG 4 OZ (WOUND CARE) ×2 IMPLANT
SPONGE GAUZE 4X4 12PLY (GAUZE/BANDAGES/DRESSINGS) ×2 IMPLANT
SPONGE LAP 18X18 X RAY DECT (DISPOSABLE) ×2 IMPLANT
SPONGE LAP 4X18 X RAY DECT (DISPOSABLE) IMPLANT
STAPLER VISISTAT 35W (STAPLE) ×2 IMPLANT
SUCTION FRAZIER TIP 10 FR DISP (SUCTIONS) ×2 IMPLANT
SUT ETHILON 4 0 PS 2 18 (SUTURE) IMPLANT
SUT ETHILON 5 0 P 3 18 (SUTURE)
SUT NYLON ETHILON 5-0 P-3 1X18 (SUTURE) IMPLANT
SYR CONTROL 10ML LL (SYRINGE) IMPLANT
TOWEL OR 17X24 6PK STRL BLUE (TOWEL DISPOSABLE) ×2 IMPLANT
TOWEL OR 17X26 10 PK STRL BLUE (TOWEL DISPOSABLE) ×2 IMPLANT
TUBE ANAEROBIC SPECIMEN COL (MISCELLANEOUS) IMPLANT
TUBE CONNECTING 12X1/4 (SUCTIONS) ×2 IMPLANT
UNDERPAD 30X30 INCONTINENT (UNDERPADS AND DIAPERS) ×2 IMPLANT
WATER STERILE IRR 1000ML POUR (IV SOLUTION) IMPLANT
YANKAUER SUCT BULB TIP NO VENT (SUCTIONS) ×2 IMPLANT

## 2012-10-11 NOTE — ED Notes (Addendum)
Returns to be admitted for L anterior elbow area abscess.was here earlier today. When he was told that he would be admitted, he left to take car home to his and returned. No labs drawn previously.

## 2012-10-11 NOTE — Anesthesia Postprocedure Evaluation (Signed)
  Anesthesia Post-op Note  [name]  Procedure(s) Performed: Procedure(s): IRRIGATION AND DEBRIDEMENT ABSCESS FOREARM (Left)  Patient Location: PACU  Anesthesia Type:General  Level of Consciousness: awake  Airway and Oxygen Therapy: Patient Spontanous Breathing  Post-op Pain: mild  Post-op Assessment: Post-op Vital signs reviewed  Post-op Vital Signs: stable  Complications: No apparent anesthesia complications

## 2012-10-11 NOTE — ED Provider Notes (Addendum)
CSN: [id number]     Arrival date & time 10/10/12  2342 History     First MD Initiated Contact with Patient 10/11/12 0248     Chief Complaint  Patient presents with  . Abscess   (Consider location/radiation/quality/duration/timing/severity/associated sxs/prior Treatment) Patient is a 32 y.o. male presenting with abscess.  Abscess Associated symptoms: fever   Associated symptoms: no headaches     History provided by the patient. Onset of symptoms about 2 weeks ago. Patient admits to injecting cocaine in his extremities. Started as an area proximal forearm pain and swelling progressively has gotten worse. Now is having fevers, severe pain and swelling, some numbness and tingling to his fingertips. No drainage. He was evaluated here earlier and left AMA - returns for further treatment and evaluation. Fever 102 Past Medical History  Diagnosis Date  . Diabetes mellitus without complication    History reviewed. No pertinent past surgical history. No family history on file. History  Substance Use Topics  . Smoking status: Current Every Day Smoker -- 1.00 packs/day  . Smokeless tobacco: Not on file  . Alcohol Use: Yes    Review of Systems  Constitutional: Positive for fever and chills.  HENT: Negative for neck pain and neck stiffness.   Eyes: Negative for visual disturbance.  Respiratory: Negative for shortness of breath.   Cardiovascular: Negative for chest pain.  Gastrointestinal: Negative for abdominal pain.  Genitourinary: Negative for flank pain.  Musculoskeletal: Negative for back pain.  Skin: Positive for rash and wound.  Neurological: Negative for headaches.  All other systems reviewed and are negative.    Allergies  Review of patient's allergies indicates no known allergies.  Home Medications   Current Outpatient Rx  Name  Route  Sig  Dispense  Refill  . alprazolam (XANAX) 2 MG tablet   Oral   Take 3 mg by mouth at bedtime.         Marland Kitchen ibuprofen (ADVIL,MOTRIN)  200 MG tablet   Oral   Take 600 mg by mouth every 6 (six) hours as needed. For pain         . insulin aspart (NOVOLOG) 100 UNIT/ML injection   Subcutaneous   Inject 5 Units into the skin 3 (three) times daily as needed for high blood sugar (pt on a sliding scale).          . insulin aspart protamine-insulin aspart (NOVOLOG 70/30) (70-30) 100 UNIT/ML injection   Subcutaneous   Inject 40-50 Units into the skin 2 (two) times daily. 55 units in the morning and 45 units at night         . lisinopril (PRINIVIL,ZESTRIL) 10 MG tablet   Oral   Take 10 mg by mouth daily.         Marland Kitchen oxymorphone (OPANA ER) 20 MG 12 hr tablet   Oral   Take 20 mg by mouth every 12 (twelve) hours.          BP 111/64  Pulse 163  Temp(Src) 102.6 F (39.2 C) (Oral)  Resp 20  Ht 5\' 9"  (1.753 m)  Wt 190 lb (86.183 kg)  BMI 28.05 kg/m2  SpO2 93% Physical Exam  Constitutional: He is oriented to person, place, and time. He appears well-developed and well-nourished.  HENT:  Head: Normocephalic and atraumatic.  Eyes: EOM are normal. Pupils are equal, round, and reactive to light.  Neck: Neck supple.  Cardiovascular: Regular rhythm and intact distal pulses.   Tachycardic  Pulmonary/Chest: Effort normal and breath sounds normal.  No respiratory distress.  Abdominal: Soft. There is no tenderness.  Musculoskeletal:  Left upper extremity with focal area of fullness to the ulnar aspect of proximal forearm. No pointing or active drainage. There is significant swelling to the entire forearm and hand with mildly tense compartments. Radial pulse intact. Decreased sensorium to light touch the fingertips. Some decreased range of motion to the hand and wrist secondary to swelling and pain. There is increased warmth throughout.  Neurological: He is alert and oriented to person, place, and time.  Skin: Skin is warm and dry.    ED Course   Procedures (including critical care time)  Labs Reviewed  CBC WITH  DIFFERENTIAL - Abnormal; Notable for the following:    WBC 30.7 (*)    HCT 35.9 (*)    MCHC 36.5 (*)    Platelets 469 (*)    Neutrophils Relative % 83 (*)    Lymphocytes Relative 10 (*)    Neutro Abs 25.4 (*)    Monocytes Absolute 2.2 (*)    All other components within normal limits  POCT I-STAT, CHEM 8 - Abnormal; Notable for the following:    Sodium 131 (*)    Creatinine, Ser 1.60 (*)    Glucose, Bld 393 (*)    All other components within normal limits  CULTURE, BLOOD (ROUTINE X 2)  CULTURE, BLOOD (ROUTINE X 2)   IVfs. IV ABx. IV narcotics  3:10 AM d/w Hand, DR Apolonio Schneiders, will evaluate for possible OR this am. Plan MED admit  Dr Orvan Falconer to admit  MDM  Fever and left forearm swelling concern for deep abscess, have significant leukocytosis by review of labs, elevated creatinine, elevated blood sugar.  IV fluids and antibiotics and narcotics provided  Hand consult  Medical admission  Teressa Lower, MD 10/11/12 0344   Date: 10/11/2012  Rate: 136  Rhythm: sinus tachycardia  QRS Axis: normal  Intervals: normal  ST/T Wave abnormalities: nonspecific ST changes  Conduction Disutrbances:none  Narrative Interpretation:   Old EKG Reviewed: none available   Teressa Lower, MD 10/11/12 [phone]

## 2012-10-11 NOTE — Preoperative (Signed)
Beta Blockers   Reason not to administer Beta Blockers:Not Applicable 

## 2012-10-11 NOTE — Progress Notes (Signed)
Patient admitted early this AM by Dr. Marin Comment.  NPO for possible surgery.  Await hand surgeon who was consulted in ER.  IV abx.  Labs in AM  Lucan DO

## 2012-10-11 NOTE — Transfer of Care (Signed)
Immediate Anesthesia Transfer of Care Note  [name]  Procedure(s) Performed: Procedure(s): IRRIGATION AND DEBRIDEMENT ABSCESS FOREARM (Left)  Patient Location: PACU  Anesthesia Type:General  Level of Consciousness: awake, oriented and patient cooperative  Airway & Oxygen Therapy: Patient Spontanous Breathing and Patient connected to face mask oxygen  Post-op Assessment: Report given to PACU RN, Post -op Vital signs reviewed and stable and Patient moving all extremities  Post vital signs: Reviewed and stable  Complications: No apparent anesthesia complications

## 2012-10-11 NOTE — Brief Op Note (Signed)
10/11/2012  9:29 AM  PATIENT:  Luke [name]  32 y.o. male  PRE-OPERATIVE DIAGNOSIS:  left forearm abscess  POST-OPERATIVE DIAGNOSIS:  SAME  PROCEDURE:  Procedure(s): IRRIGATION AND DEBRIDEMENT ABSCESS FOREARM (Left) DECOMPRESSIVE FASCIOTOMY  SURGEON:  Surgeon(s) and Role:    * Linna Hoff, MD - Primary  PHYSICIAN ASSISTANT: NONE  ASSISTANTS: none   ANESTHESIA:   general  EBL:   minimal  BLOOD ADMINISTERED:0000 CC PRBC  DRAINS: none   LOCAL MEDICATIONS USED:  NONE  SPECIMEN:  No Specimen  DISPOSITION OF SPECIMEN:  N/A  COUNTS:  YES  TOURNIQUET:  Less than 15 minutes  DICTATION: [id number]  PLAN OF CARE: Admit to inpatient   PATIENT DISPOSITION:  PACU - hemodynamically stable.   Delay start of Pharmacological VTE agent (>24hrs) due to surgical blood loss or risk of bleeding: not applicable  PT'S FOREARM LOOKED BAD LARGE AMOUNT OF INFECTION PLAN FOR REPEAT I/D IN OR IN THE AM

## 2012-10-11 NOTE — Anesthesia Preprocedure Evaluation (Signed)
Anesthesia Evaluation  Patient identified by MRN, date of birth, ID band Patient awake  General Assessment Comment:IV drug use  History of Anesthesia Complications Negative for: history of anesthetic complications  Airway Mallampati: I      Dental  (+) Teeth Intact   Pulmonary neg pulmonary ROS,  breath sounds clear to auscultation        Cardiovascular hypertension, Rhythm:Regular Rate:Normal     Neuro/Psych  Neuromuscular disease    GI/Hepatic negative GI ROS, Neg liver ROS,   Endo/Other  diabetes  Renal/GU      Musculoskeletal   Abdominal   Peds  Hematology   Anesthesia Other Findings   Reproductive/Obstetrics                           Anesthesia Physical Anesthesia Plan  ASA: II and emergent  Anesthesia Plan: General   Post-op Pain Management:    Induction: Intravenous  Airway Management Planned: Oral ETT  Additional Equipment:   Intra-op Plan:   Post-operative Plan: Extubation in OR  Informed Consent: I have reviewed the patients History and Physical, chart, labs and discussed the procedure including the risks, benefits and alternatives for the proposed anesthesia with the patient or authorized representative who has indicated his/her understanding and acceptance.   Dental advisory given  Plan Discussed with: CRNA and Surgeon  Anesthesia Plan Comments:         Anesthesia Quick Evaluation

## 2012-10-11 NOTE — Anesthesia Procedure Notes (Signed)
Procedure Name: Intubation Date/Time: 10/11/2012 10:04 AM Performed by: Izora Gala Pre-anesthesia Checklist: Patient identified, Emergency Drugs available, Suction available and Patient being monitored Patient Re-evaluated:Patient Re-evaluated prior to inductionOxygen Delivery Method: Circle system utilized Preoxygenation: Pre-oxygenation with 100% oxygen Intubation Type: IV induction Ventilation: Mask ventilation without difficulty Laryngoscope Size: Miller and 3 Grade View: Grade I Tube type: Oral Tube size: 8.0 mm Airway Equipment and Method: Stylet,  Bite block and LTA kit utilized Placement Confirmation: ETT inserted through vocal cords under direct vision,  positive ETCO2 and breath sounds checked- equal and bilateral Secured at: 22 cm Tube secured with: Tape Dental Injury: Teeth and Oropharynx as per pre-operative assessment

## 2012-10-11 NOTE — Consult Note (Signed)
Reason for Consult:Left arm abscess Referring Physician: Dr. Rejeana [name] is an 32 y.o. male.  HPI: right hand dominant male who has history of ivdu, was injecting cocaine into left arm about two weeks ago started noticing pain and swelling in left arm Presented to ed with worsening pain and swelling Pt admitted for infection to left arm No prior surgery to left arm   Past Medical History  Diagnosis Date  . Diabetes mellitus without complication     History reviewed. No pertinent past surgical history.  No family history on file.  Social History:  reports that he has been smoking.  He does not have any smokeless tobacco history on file. He reports that  drinks alcohol. He reports that he does not use illicit drugs.  Allergies: No Known Allergies  Medications: I have reviewed the patient's current medications.  Results for orders placed during the hospital encounter of 10/11/12 (from the past 48 hour(s))  CBC WITH DIFFERENTIAL     Status: Abnormal   Collection Time    10/11/12 12:12 AM      Result Value Range   WBC 30.7 (*) 4.0 - 10.5 K/uL   RBC 4.34  4.22 - 5.81 MIL/uL   Hemoglobin 13.1  13.0 - 17.0 g/dL   HCT 35.9 (*) 39.0 - 52.0 %   MCV 82.7  78.0 - 100.0 fL   MCH 30.2  26.0 - 34.0 pg   MCHC 36.5 (*) 30.0 - 36.0 g/dL   RDW 13.4  11.5 - 15.5 %   Platelets 469 (*) 150 - 400 K/uL   Neutrophils Relative % 83 (*) 43 - 77 %   Lymphocytes Relative 10 (*) 12 - 46 %   Monocytes Relative 7  3 - 12 %   Eosinophils Relative 0  0 - 5 %   Basophils Relative 0  0 - 1 %   Neutro Abs 25.4 (*) 1.7 - 7.7 K/uL   Lymphs Abs 3.1  0.7 - 4.0 K/uL   Monocytes Absolute 2.2 (*) 0.1 - 1.0 K/uL   Eosinophils Absolute 0.0  0.0 - 0.7 K/uL   Basophils Absolute 0.0  0.0 - 0.1 K/uL   WBC Morphology INCREASED BANDS (>20% BANDS)    POCT I-STAT, CHEM 8     Status: Abnormal   Collection Time    10/11/12 12:57 AM      Result Value Range   Sodium 131 (*) 135 - 145 mEq/L   Potassium 3.9  3.5 -  5.1 mEq/L   Chloride 97  96 - 112 mEq/L   BUN 15  6 - 23 mg/dL   Creatinine, Ser 1.60 (*) 0.50 - 1.35 mg/dL   Glucose, Bld 393 (*) 70 - 99 mg/dL   Calcium, Ion 1.16  1.12 - 1.23 mmol/L   TCO2 22  0 - 100 mmol/L   Hemoglobin 13.9  13.0 - 17.0 g/dL   HCT 41.0  39.0 - 52.0 %  GLUCOSE, CAPILLARY     Status: Abnormal   Collection Time    10/11/12  5:11 AM      Result Value Range   Glucose-Capillary 306 (*) 70 - 99 mg/dL  GLUCOSE, CAPILLARY     Status: Abnormal   Collection Time    10/11/12  8:18 AM      Result Value Range   Glucose-Capillary 258 (*) 70 - 99 mg/dL    No results found.  Denies recent illness or hospitalization Blood pressure 125/76, pulse 130, temperature  98.6 F (37 C), temperature source Oral, resp. rate 18, height 5\' 9"  (1.753 m), weight 86.183 kg (190 lb), SpO2 99.00%.  General Appearance:  Alert, cooperative, no distress, appears stated age  Head:  Normocephalic, without obvious abnormality, atraumatic  Eyes:  Pupils equal pinpoint, conjunctiva/corneas clear,         Throat: Lips, mucosa, and tongue normal; teeth and gums normal  Neck: No visible masses     Lungs:   respirations unlabored  Chest Wall:  No tenderness or deformity  Heart:  Regular rate and rhythm,  Abdomen:   Soft, non-tender,         Extremities: LUE: large amount of swelling in left forearm fluctuant area over proximal medial forearm over course of fcu/ulnar nerve Able to wiggle fingers Forearm tense and swollen. Palpable radial pulse  Pulses: 2+ and symmetric  Skin: Skin color, texture, turgor normal, no rashes or lesions     Neurologic: Normal    Assessment/Plan: Left forearm abscess IVDU-cocaine  To or this am for incision and drainage of forearm abscess and debridement as indicated  R/B/A DISCUSSED WITH PT IN THE HOSPITAL.  PT VOICED UNDERSTANDING OF PLAN CONSENT SIGNED DAY OF SURGERY PT SEEN AND EXAMINED PRIOR TO OPERATIVE PROCEDURE/DAY OF SURGERY SITE  MARKED. QUESTIONS ANSWERED WILL REMAIN AN INPATIENT FOLLOWING SURGERY   Luke [name] 10/11/2012, 9:24 AM

## 2012-10-11 NOTE — Progress Notes (Signed)
ANTIBIOTIC CONSULT NOTE - INITIAL  Pharmacy Consult for Vancomycin Indication: abcess  No Known Allergies  Patient Measurements: Height: 5\' 9"  (175.3 cm) Weight: 190 lb (86.183 kg) IBW/kg (Calculated) : 70.7  Vital Signs: Temp: 102.6 F (39.2 C) (07/25 2354) Temp src: Oral (07/25 2354) BP: 99/63 mmHg (07/26 0400) Pulse Rate: 120 (07/26 0400)  Labs:  Recent Labs  10/11/12 0012 10/11/12 0057  WBC 30.7*  --   HGB 13.1 13.9  PLT 469*  --   CREATININE  --  1.60*   Estimated Creatinine Clearance: 72.1 ml/min (by C-G formula based on Cr of 1.6). No results found for this basename: VANCOTROUGH, VANCOPEAK, VANCORANDOM, GENTTROUGH, GENTPEAK, GENTRANDOM, TOBRATROUGH, TOBRAPEAK, TOBRARND, AMIKACINPEAK, AMIKACINTROU, AMIKACIN,  in the last 72 hours   Microbiology: No results found for this or any previous visit (from the past 720 hour(s)).  Medical History: Past Medical History  Diagnosis Date  . Diabetes mellitus without complication     Medications:  Prescriptions prior to admission  Medication Sig Dispense Refill  . alprazolam (XANAX) 2 MG tablet Take 3 mg by mouth at bedtime.      Marland Kitchen ibuprofen (ADVIL,MOTRIN) 200 MG tablet Take 600 mg by mouth every 6 (six) hours as needed. For pain      . insulin aspart (NOVOLOG) 100 UNIT/ML injection Inject 5 Units into the skin 3 (three) times daily as needed for high blood sugar (pt on a sliding scale).       . insulin aspart protamine-insulin aspart (NOVOLOG 70/30) (70-30) 100 UNIT/ML injection Inject 40-50 Units into the skin 2 (two) times daily. 55 units in the morning and 45 units at night      . lisinopril (PRINIVIL,ZESTRIL) 10 MG tablet Take 10 mg by mouth daily.      Marland Kitchen oxymorphone (OPANA ER) 20 MG 12 hr tablet Take 20 mg by mouth every 12 (twelve) hours.       Assessment: 32 yo male with left arm abcess for empiric antibiotics.  Vancomycin 1 g IV given in ED at 0330  Goal of Therapy:  Vancomycin trough level 10-15  mcg/ml  Plan:  Vancomycin 1 g IV q8h  Caryl Pina 10/11/2012,4:37 AM

## 2012-10-11 NOTE — H&P (Signed)
[facility] History and Physical  Luke [name] [id number] DOB: 1980-04-17    PCP:   NONE  Chief Complaint: swelling of left forearm for 5 days.  HPI: Luke [name] is an 32 y.o. male diabetic on Mixtard 70/30 about 50 units BID, HTN on Lisinopril, IVDA with cocaine last negative HIV 2 years ago, presents to the ER with left arm redness, swelling, pain, and losing distal finger sensation progressively for the past 5 days.  He has subjective fever, diaphoresis, but no chills.  Evaluation in the ER included a WBC of 30K Hb of 13 grams per DL, and cr of 1.6, with CBG of 393.  He was given VAN and hospitalitt was asked to admit him for celluliltis with possible abcess.  Rewiew of Systems:  Constitutional:No significant weight loss or weight gain Eyes: Negative for eye pain, redness and discharge, diplopia, visual changes, or flashes of light. ENMT: Negative for ear pain, hoarseness, nasal congestion, sinus pressure and sore throat. No headaches; tinnitus, drooling, or problem swallowing. Cardiovascular: Negative for chest pain, palpitations, diaphoresis, dyspnea and peripheral edema. ; No orthopnea, PND Respiratory: Negative for cough, hemoptysis, wheezing and stridor. No pleuritic chestpain. Gastrointestinal: Negative for nausea, vomiting, diarrhea, constipation, abdominal pain, melena, blood in stool, hematemesis, jaundice and rectal bleeding.    Genitourinary: Negative for frequency, dysuria, incontinence,flank pain and hematuria; Musculoskeletal: Negative for back pain and neck pain. Negative for swelling and trauma.;  Skin: . Negative for pruritus,abrasions, bruising and skin lesion.; ulcerations Neuro: Negative for headache, lightheadedness and neck stiffness. Negative for weakness, altered level of consciousness , altered mental status, extremity weakness, burning feet, involuntary movement, seizure and syncope.  Psych: negative for anxiety, depression, insomnia, tearfulness, panic  attacks, hallucinations, paranoia, suicidal or homicidal ideation    Past Medical History  Diagnosis Date  . Diabetes mellitus without complication     History reviewed. No pertinent past surgical history.  Medications:  HOME MEDS: Prior to Admission medications   Medication Sig Start Date End Date Taking? Authorizing Provider  alprazolam Duanne Moron) 2 MG tablet Take 3 mg by mouth at bedtime.    Historical Provider, MD  ibuprofen (ADVIL,MOTRIN) 200 MG tablet Take 600 mg by mouth every 6 (six) hours as needed. For pain    Historical Provider, MD  insulin aspart (NOVOLOG) 100 UNIT/ML injection Inject 5 Units into the skin 3 (three) times daily as needed for high blood sugar (pt on a sliding scale).     Historical Provider, MD  insulin aspart protamine-insulin aspart (NOVOLOG 70/30) (70-30) 100 UNIT/ML injection Inject 40-50 Units into the skin 2 (two) times daily. 55 units in the morning and 45 units at night    Historical Provider, MD  lisinopril (PRINIVIL,ZESTRIL) 10 MG tablet Take 10 mg by mouth daily.    Historical Provider, MD  oxymorphone (OPANA ER) 20 MG 12 hr tablet Take 20 mg by mouth every 12 (twelve) hours.    Historical Provider, MD     Allergies:  No Known Allergies  Social History:   reports that he has been smoking.  He does not have any smokeless tobacco history on file. He reports that  drinks alcohol. He reports that he does not use illicit drugs.  Family History: No family history on file.   Physical Exam: Filed Vitals:   10/10/12 2354  BP: 111/64  Pulse: 163  Temp: 102.6 F (39.2 C)  TempSrc: Oral  Resp: 20  Height: 5\' 9"  (1.753 m)  Weight: 86.183 kg (190 lb)  SpO2: 93%   Blood pressure 111/64, pulse 163, temperature 102.6 F (39.2 C), temperature source Oral, resp. rate 20, height 5\' 9"  (1.753 m), weight 86.183 kg (190 lb), SpO2 93.00%.  GEN:  Pleasant  patient lying in the stretcher in no acute distress; cooperative with exam. PSYCH:  alert and  oriented x4; does not appear anxious or depressed; affect is appropriate. HEENT: Mucous membranes pink and anicteric; PERRLA; EOM intact; no cervical lymphadenopathy nor thyromegaly or carotid bruit; no JVD; There were no stridor. Neck is very supple. Breasts:: Not examined CHEST WALL: No tenderness CHEST: Normal respiration, clear to auscultation bilaterally.  HEART: Regular rate and rhythm.  There are no murmur, rub, or gallops.   BACK: No kyphosis or scoliosis; no CVA tenderness ABDOMEN: soft and non-tender; no masses, no organomegaly, normal abdominal bowel sounds; no pannus; no intertriginous candida. There is no rebound and no distention. Rectal Exam: Not done EXTREMITIES: No bone or joint deformity; age-appropriate arthropathy of the hands and knees; there is moderate tense swelling of the proximal forearm, with erythema and swelling along with tenderness and decrease ROM at the elbow. Genitalia: not examined PULSES: 2+ and symmetric SKIN: Normal hydration no rash or ulceration CNS: Cranial nerves 2-12 grossly intact no focal lateralizing neurologic deficit.  Speech is fluent; uvula elevated with phonation, facial symmetry and tongue midline. DTR are normal bilaterally, cerebella exam is intact, barbinski is negative and strengths are equaled bilaterally.  No sensory loss.   Labs on Admission:  Basic Metabolic Panel:  Recent Labs Lab 10/11/12 0057  NA 131*  K 3.9  CL 97  GLUCOSE 393*  BUN 15  CREATININE 1.60*   Liver Function Tests: No results found for this basename: AST, ALT, ALKPHOS, BILITOT, PROT, ALBUMIN,  in the last 168 hours No results found for this basename: LIPASE, AMYLASE,  in the last 168 hours No results found for this basename: AMMONIA,  in the last 168 hours CBC:  Recent Labs Lab 10/11/12 0012 10/11/12 0057  WBC 30.7*  --   NEUTROABS 25.4*  --   HGB 13.1 13.9  HCT 35.9* 41.0  MCV 82.7  --   PLT 469*  --    Cardiac Enzymes: No results found for this  basename: CKTOTAL, CKMB, CKMBINDEX, TROPONINI,  in the last 168 hours   Radiological Exams on Admission: No results found.   Assessment/Plan Present on Admission:  . Drug abuse, IV . Tobacco abuse . Cellulitis and abscess . DM (dermatomyositis) . HTN (hypertension)  PLAN:  He clearly has cellulitis with likely abcess of the proximal left forearm.  I am also concerned about compartment syndrome as the swelling is tense and he has decreased distal sensation.  I don't think he has septic thromboplebitis.  Will give VAN and Cipro and NPO as surgery will likely take him to the OR.  For his DM, since he is NPO, will reduce his Mixtard and change to NPH at 20units BID with SSI.  He will continue his lisionpril for his HTN.  Will [facility] to [facility] service.  Thank you for allowing [place] to participate in his care.  Other plans as per orders.  Code Status: FULL Haskel Khan, MD. [facility] Pager [phone] 7pm to 7am.  10/11/2012, 3:52 AM

## 2012-10-12 ENCOUNTER — Encounter (HOSPITAL_COMMUNITY)
Admission: EM | Discharge status: Against Medical Advice | Payer: Self-pay | Source: Home / Self Care | Attending: Internal Medicine

## 2012-10-12 ENCOUNTER — Encounter (HOSPITAL_COMMUNITY): Payer: Self-pay | Admitting: Anesthesiology

## 2012-10-12 ENCOUNTER — Inpatient Hospital Stay (HOSPITAL_COMMUNITY): Payer: Medicaid Other | Admitting: Anesthesiology

## 2012-10-12 DIAGNOSIS — IMO0002 Reserved for concepts with insufficient information to code with codable children: Principal | ICD-10-CM

## 2012-10-12 DIAGNOSIS — F1910 Other psychoactive substance abuse, uncomplicated: Secondary | ICD-10-CM

## 2012-10-12 DIAGNOSIS — M3390 Dermatopolymyositis, unspecified, organ involvement unspecified: Secondary | ICD-10-CM

## 2012-10-12 DIAGNOSIS — I10 Essential (primary) hypertension: Secondary | ICD-10-CM

## 2012-10-12 DIAGNOSIS — B9689 Other specified bacterial agents as the cause of diseases classified elsewhere: Secondary | ICD-10-CM

## 2012-10-12 HISTORY — PX: I & D EXTREMITY: SHX5045

## 2012-10-12 LAB — GLUCOSE, CAPILLARY
Glucose-Capillary: 270 mg/dL — ABNORMAL HIGH (ref 70–99)
Glucose-Capillary: 322 mg/dL — ABNORMAL HIGH (ref 70–99)
Glucose-Capillary: 198 mg/dL — ABNORMAL HIGH (ref 70–99)
Glucose-Capillary: 181 mg/dL — ABNORMAL HIGH (ref 70–99)

## 2012-10-12 LAB — HEMOGLOBIN A1C
Hgb A1c MFr Bld: 10.1 % — ABNORMAL HIGH (ref <5.7)
Mean Plasma Glucose: 243 mg/dL — ABNORMAL HIGH (ref <117)

## 2012-10-12 LAB — BASIC METABOLIC PANEL
CO2: 22 mEq/L (ref 19–32)
GFR calc Af Amer: 59 mL/min — ABNORMAL LOW (ref >90)
Potassium: 3.7 mEq/L (ref 3.5–5.1)
Sodium: 130 mEq/L — ABNORMAL LOW (ref 135–145)

## 2012-10-12 LAB — CBC
MCH: 28.5 pg (ref 26.0–34.0)
Platelets: 338 10*3/uL (ref 150–400)
RBC: 3.55 MIL/uL — ABNORMAL LOW (ref 4.22–5.81)

## 2012-10-12 SURGERY — IRRIGATION AND DEBRIDEMENT EXTREMITY
Anesthesia: General | Site: Arm Lower | Laterality: Left | Wound class: Dirty or Infected

## 2012-10-12 MED ORDER — SODIUM CHLORIDE 0.9 % IR SOLN
Status: DC | PRN
  Administered 2012-10-12: 13:00:00

## 2012-10-12 MED ORDER — HYDROMORPHONE HCL PF 1 MG/ML IJ SOLN
0.2500 mg | INTRAMUSCULAR | Status: DC | PRN
  Administered 2012-10-12 (×6): 0.5 mg via INTRAVENOUS

## 2012-10-12 MED ORDER — ONDANSETRON HCL 4 MG/2ML IJ SOLN: 4.0000 mg | Freq: Once | INTRAMUSCULAR | Status: DC | PRN

## 2012-10-12 MED ORDER — AMPICILLIN-SULBACTAM SODIUM 3 (2-1) G IJ SOLR
3.0000 g | Freq: Four times a day (QID) | INTRAMUSCULAR | Status: DC
  Administered 2012-10-12 – 2012-10-13 (×4): 3 g via INTRAVENOUS
  Filled 2012-10-12 (×6): qty 3

## 2012-10-12 MED ORDER — OXYCODONE HCL 5 MG PO TABS
10.0000 mg | ORAL_TABLET | Freq: Once | ORAL | Status: AC
  Administered 2012-10-12: 10 mg via ORAL
  Filled 2012-10-12: qty 2

## 2012-10-12 MED ORDER — HYDROMORPHONE HCL PF 1 MG/ML IJ SOLN: 0.2500 mg | INTRAMUSCULAR | Status: DC | PRN

## 2012-10-12 MED ORDER — SODIUM CHLORIDE 0.9 % IR SOLN
Status: DC | PRN
  Administered 2012-10-12: 1

## 2012-10-12 MED ORDER — HYDROMORPHONE HCL PF 1 MG/ML IJ SOLN
INTRAMUSCULAR | Status: AC
  Filled 2012-10-12: qty 1

## 2012-10-12 MED ORDER — WHITE PETROLATUM GEL
Status: AC
  Filled 2012-10-12: qty 5

## 2012-10-12 MED ORDER — OXYCODONE HCL 5 MG PO TABS
ORAL_TABLET | ORAL | Status: AC
  Administered 2012-10-12: 10 mg
  Filled 2012-10-12: qty 2

## 2012-10-12 MED ORDER — PROPOFOL 10 MG/ML IV BOLUS
INTRAVENOUS | Status: DC | PRN
  Administered 2012-10-12: 200 mg via INTRAVENOUS

## 2012-10-12 MED ORDER — FENTANYL CITRATE 0.05 MG/ML IJ SOLN
INTRAMUSCULAR | Status: DC | PRN
  Administered 2012-10-12 (×3): 50 ug via INTRAVENOUS
  Administered 2012-10-12: 100 ug via INTRAVENOUS
  Administered 2012-10-12: 50 ug via INTRAVENOUS
  Administered 2012-10-12: 100 ug via INTRAVENOUS
  Administered 2012-10-12 (×2): 50 ug via INTRAVENOUS

## 2012-10-12 MED ORDER — LACTATED RINGERS IV SOLN
INTRAVENOUS | Status: DC | PRN
  Administered 2012-10-12 (×2): via INTRAVENOUS

## 2012-10-12 MED ORDER — LIDOCAINE HCL 1 % IJ SOLN
INTRAMUSCULAR | Status: DC | PRN
  Administered 2012-10-12: 80 mg via INTRADERMAL

## 2012-10-12 MED ORDER — SODIUM CHLORIDE 0.9 % IR SOLN
Status: DC | PRN
  Administered 2012-10-12: 6000 mL

## 2012-10-12 SURGICAL SUPPLY — 56 items
BAG DECANTER FOR FLEXI CONT (MISCELLANEOUS) ×2 IMPLANT
BANDAGE CONFORM 2  STR LF (GAUZE/BANDAGES/DRESSINGS) IMPLANT
BANDAGE ELASTIC 3 VELCRO ST LF (GAUZE/BANDAGES/DRESSINGS) IMPLANT
BANDAGE ELASTIC 4 VELCRO ST LF (GAUZE/BANDAGES/DRESSINGS) ×2 IMPLANT
BANDAGE GAUZE ELAST BULKY 4 IN (GAUZE/BANDAGES/DRESSINGS) ×2 IMPLANT
BNDG COHESIVE 1X5 TAN STRL LF (GAUZE/BANDAGES/DRESSINGS) IMPLANT
BNDG ESMARK 4X9 LF (GAUZE/BANDAGES/DRESSINGS) IMPLANT
CLOTH BEACON ORANGE TIMEOUT ST (SAFETY) ×2 IMPLANT
CORDS BIPOLAR (ELECTRODE) IMPLANT
COVER SURGICAL LIGHT HANDLE (MISCELLANEOUS) ×2 IMPLANT
CUFF TOURNIQUET SINGLE 18IN (TOURNIQUET CUFF) ×2 IMPLANT
CUFF TOURNIQUET SINGLE 24IN (TOURNIQUET CUFF) IMPLANT
DRAIN PENROSE 1/2X12 LTX STRL (WOUND CARE) ×2 IMPLANT
DRAIN PENROSE 1/4X12 LTX STRL (WOUND CARE) ×4 IMPLANT
DRAPE SURG 17X23 STRL (DRAPES) ×2 IMPLANT
DRAPE X-RAY CASS 24X20 (DRAPES) ×4 IMPLANT
DRSG ADAPTIC 3X8 NADH LF (GAUZE/BANDAGES/DRESSINGS) IMPLANT
ELECT REM PT RETURN 9FT ADLT (ELECTROSURGICAL) ×2
ELECTRODE REM PT RTRN 9FT ADLT (ELECTROSURGICAL) ×1 IMPLANT
GAUZE XEROFORM 1X8 LF (GAUZE/BANDAGES/DRESSINGS) IMPLANT
GAUZE XEROFORM 5X9 LF (GAUZE/BANDAGES/DRESSINGS) IMPLANT
GLOVE BIOGEL PI IND STRL 8.5 (GLOVE) ×1 IMPLANT
GLOVE BIOGEL PI INDICATOR 8.5 (GLOVE) ×1
GLOVE SURG ORTHO 8.0 STRL STRW (GLOVE) ×2 IMPLANT
GOWN PREVENTION PLUS XLARGE (GOWN DISPOSABLE) ×2 IMPLANT
GOWN STRL NON-REIN LRG LVL3 (GOWN DISPOSABLE) ×2 IMPLANT
HANDPIECE INTERPULSE COAX TIP (DISPOSABLE) ×1
IV NS IRRIG 3000ML ARTHROMATIC (IV SOLUTION) ×4 IMPLANT
KIT BASIN OR (CUSTOM PROCEDURE TRAY) ×2 IMPLANT
KIT ROOM TURNOVER OR (KITS) ×2 IMPLANT
MANIFOLD NEPTUNE II (INSTRUMENTS) ×2 IMPLANT
NEEDLE HYPO 25GX1X1/2 BEV (NEEDLE) IMPLANT
NS IRRIG 1000ML POUR BTL (IV SOLUTION) ×2 IMPLANT
PACK ORTHO EXTREMITY (CUSTOM PROCEDURE TRAY) ×2 IMPLANT
PAD ARMBOARD 7.5X6 YLW CONV (MISCELLANEOUS) ×4 IMPLANT
PAD CAST 4YDX4 CTTN HI CHSV (CAST SUPPLIES) ×1 IMPLANT
PADDING CAST COTTON 4X4 STRL (CAST SUPPLIES) ×1
SET HNDPC FAN SPRY TIP SCT (DISPOSABLE) ×1 IMPLANT
SOAP 2 % CHG 4 OZ (WOUND CARE) ×2 IMPLANT
SPONGE GAUZE 4X4 12PLY (GAUZE/BANDAGES/DRESSINGS) ×2 IMPLANT
SPONGE LAP 18X18 X RAY DECT (DISPOSABLE) ×6 IMPLANT
SPONGE LAP 4X18 X RAY DECT (DISPOSABLE) IMPLANT
SUCTION FRAZIER TIP 10 FR DISP (SUCTIONS) ×2 IMPLANT
SUT ETHILON 2 0 PSLX (SUTURE) ×6 IMPLANT
SUT ETHILON 4 0 PS 2 18 (SUTURE) IMPLANT
SUT ETHILON 5 0 P 3 18 (SUTURE)
SUT NYLON ETHILON 5-0 P-3 1X18 (SUTURE) IMPLANT
SYR BULB IRRIGATION 50ML (SYRINGE) ×2 IMPLANT
SYR CONTROL 10ML LL (SYRINGE) IMPLANT
TOWEL OR 17X24 6PK STRL BLUE (TOWEL DISPOSABLE) ×2 IMPLANT
TOWEL OR 17X26 10 PK STRL BLUE (TOWEL DISPOSABLE) ×2 IMPLANT
TUBE ANAEROBIC SPECIMEN COL (MISCELLANEOUS) IMPLANT
TUBE CONNECTING 12X1/4 (SUCTIONS) ×2 IMPLANT
UNDERPAD 30X30 INCONTINENT (UNDERPADS AND DIAPERS) ×2 IMPLANT
WATER STERILE IRR 1000ML POUR (IV SOLUTION) IMPLANT
YANKAUER SUCT BULB TIP NO VENT (SUCTIONS) ×2 IMPLANT

## 2012-10-12 NOTE — Anesthesia Preprocedure Evaluation (Addendum)
Anesthesia Evaluation  Patient identified by MRN, date of birth, ID band Patient awake    Reviewed: Allergy & Precautions, H&P , NPO status , Patient's Chart, lab work & pertinent test results  Airway Mallampati: I TM Distance: >3 FB Neck ROM: full    Dental  (+) Teeth Intact and Dental Advisory Given   Pulmonary          Cardiovascular hypertension, Rhythm:regular Rate:Normal     Neuro/Psych  Neuromuscular disease    GI/Hepatic   Endo/Other  diabetes, Type 2, Insulin Dependent  Renal/GU      Musculoskeletal   Abdominal   Peds  Hematology   Anesthesia Other Findings   Reproductive/Obstetrics                          Anesthesia Physical Anesthesia Plan  ASA: III  Anesthesia Plan: General   Post-op Pain Management:    Induction: Intravenous  Airway Management Planned: Oral ETT and LMA  Additional Equipment:   Intra-op Plan:   Post-operative Plan: Extubation in OR  Informed Consent: I have reviewed the patients History and Physical, chart, labs and discussed the procedure including the risks, benefits and alternatives for the proposed anesthesia with the patient or authorized representative who has indicated his/her understanding and acceptance.     Plan Discussed with: Anesthesiologist, CRNA and Surgeon  Anesthesia Plan Comments:         Anesthesia Quick Evaluation

## 2012-10-12 NOTE — Op Note (Signed)
Luke [name], Luke [name] NO.:  [id number]  MEDICAL RECORD NO.:  [id number]  LOCATION:  [id number]                        FACILITY:  [facility]  PHYSICIAN:  Luke Nakayama, MD  DATE OF BIRTH:  May 20, 1980  DATE OF PROCEDURE:  10/11/2012 DATE OF DISCHARGE:                              OPERATIVE REPORT   PREOPERATIVE DIAGNOSES: 1. Left forearm abscess, volar and ulnar. 2. Left forearm abscess, dorsal extensor musculature. 3. IV drug use.  POSTOPERATIVE DIAGNOSES: 1. Left forearm abscess, volar and ulnar. 2. Left forearm abscess, dorsal extensor musculature. 3. IV drug use.  ATTENDING PHYSICIAN:  Linna Hoff IV, MD, who scrubbed and present for the entire procedure.  ASSISTANT SURGEON:  None.  ANESTHESIA:  General via LMA.  SURGICAL PROCEDURE: 1. Incision and drainage, left forearm deep abscess, volar and ulnar. 2. Left forearm incision and drainage, deep abscess, extensor through     a separate incision. 3. Left forearm decompressive fasciotomy and debridement of nonviable     muscle, excisional debridement of flexor and extensor surfaces.  SURGICAL INDICATIONS:  [name] is a 32 year old diabetic, who unfortunately relapsed and was injecting drugs into his left arm.  The patient presented with worsening infection of his left arm.  The patient was seen and evaluated, and based on the severity of his infection, it was recommended that he undergo urgent incision and drainage and debridement.  Risks, benefits, and alternatives were discussed in detail with the patient and a signed informed consent was obtained.  Risks include, but not limited to bleeding, infection, damage to nearby nerves, arteries, or tendons, loss of motion of the wrist and digits, incomplete relief of symptoms, and need for further surgical intervention.  DESCRIPTION OF PROCEDURE:  The patient was properly identified in the preop holding area and marked with a permanent marker made on  left forearm to indicate the correct operative site.  The patient was then brought back to the operating room, placed supine on the anesthesia room table.  General anesthesia was administered.  The patient tolerated this well.  The patient was on preoperative antibiotics by the Medicine Service.  A well-padded tourniquet was then placed on left brachium, sealed with 1000 drape.  Left upper extremity was then prepped and draped in normal sterile fashion.  Time-out was called.  Correct side was identified, and procedure then begun.  Attention was then turned to left upper extremity where a curvilinear incision made directly from the medial epicondyle all the way extending down the mid region of the forearm.  Gross purulence was then encountered in the superficial regions of the forearm and wound culture had been taken, aerobic and anaerobic cultures.  After incision and drainage of the deep area, the forearm fasciotomy was then carried out.  Decompressive fasciotomy was then carried out of the flexor surface and the patient had extensive amount of infection along the interosseous membrane.  The superficial deep compartments of the forearm were then opened up and a large amount of purulence was then removed.  The median nerve was then carefully identified and protected throughout.  __________ through a separate incision, the patient also has the concerning evidence of  abscess over the dorsal radial aspect of the forearm.  Incision was then made. Dissection was then carried down through the skin and subcutaneous tissue where small abscess was then encountered.  The fascia was then opened up, and extensor surface was then opened up.  The superficial radial nerve was then carefully protected.  Thorough irrigation done throughout.  Pulsatile lavage was then used 3 L as well as the antibiotic solution 1 L.  __________ decompression and fasciotomy incision and drainage __________ abscess area, the  wound was then irrigated and loosely closed with skin staples.  Adaptic dressing, sterile compressive bandage was then applied.  The patient tolerated the procedure well, returned to recovery room in good condition.  POSTPROCEDURE PLAN:  The patient will return back to the OR tomorrow for repeat I and D given the extensive infection that he had and continue on the IV antibiotics.  The patient is going to require a long course of IV antibiotics and wound care given the extent of his injury and infection.     Luke Nakayama, MD     FWO/MEDQ  D:  10/11/2012  T:  10/11/2012  Job:  [id number]

## 2012-10-12 NOTE — Transfer of Care (Signed)
Immediate Anesthesia Transfer of Care Note  [name]  Procedure(s) Performed: Procedure(s): IRRIGATION AND DEBRIDEMENT FOREARM (Left)  Patient Location: PACU  Anesthesia Type:General  Level of Consciousness: awake, sedated and patient cooperative  Airway & Oxygen Therapy: Patient Spontanous Breathing  Post-op Assessment: Report given to PACU RN, Post -op Vital signs reviewed and stable and Patient moving all extremities X 4  Post vital signs: stable  Complications: No apparent anesthesia complications

## 2012-10-12 NOTE — Anesthesia Postprocedure Evaluation (Signed)
  Anesthesia Post-op Note  [name]  Procedure(s) Performed: Procedure(s): IRRIGATION AND DEBRIDEMENT FOREARM (Left)  Patient Location: PACU  Anesthesia Type:General  Level of Consciousness: awake, oriented and patient cooperative  Airway and Oxygen Therapy: Patient Spontanous Breathing  Post-op Pain: mild, moderate  Post-op Assessment: Post-op Vital signs reviewed, Patient's Cardiovascular Status Stable, Respiratory Function Stable, Patent Airway, No signs of Nausea or vomiting and Pain level controlled  Post-op Vital Signs: stable  Complications: No apparent anesthesia complications

## 2012-10-12 NOTE — Consult Note (Signed)
[facility] for [facility]    Date of Admission:  10/11/2012           Day 2 vancomycin        Day 2 ciprofloxacin       Reason for Consult: Polymicrobial left forearm abscess   Referring Physician: Dr. Gavin Pound   Principal Problem:   Cellulitis and abscess Active Problems:   Drug abuse, IV   Tobacco abuse   DM (dermatomyositis)   HTN (hypertension)   . alprazolam  3 mg Oral QHS  . ciprofloxacin  400 mg Intravenous BID  . docusate sodium  100 mg Oral BID  . heparin  5,000 Units Subcutaneous Q8H  . HYDROmorphone      . HYDROmorphone      . HYDROmorphone      . insulin aspart  0-15 Units Subcutaneous TID WC  . insulin aspart protamine- aspart  45 Units Subcutaneous Q supper  . insulin aspart protamine- aspart  55 Units Subcutaneous Q breakfast  . ketorolac  30 mg Intravenous Q6H  . lisinopril  10 mg Oral Daily  . oxyCODONE      . pantoprazole  40 mg Oral Daily  . vancomycin  1,000 mg Intravenous Q8H    Recommendations: 1. Continue vancomycin 2. Change ciprofloxacin to IV ampicillin sulbactam pending final culture results   Assessment: Gram stain of his left forearm abscess shows gram-positive cocci and gram-negative rods. That combined liver his bowel smell suggests that he may have a mixed aerobic and anaerobic soft tissue infection. I favor changing ciprofloxacin to ampicillin sulbactam to provide broader anaerobic coverage pending final culture results. I will follow with you.   HPI: Luke [name] is a 32 y.o. male history of injecting drug use who developed painful swelling of his left arm and was admitted 2 days ago. The swelling occurred in an area where he had been injecting drugs over the past few weeks. He is going to surgery x2 for debridement of soft tissue, fascia and muscle. Dr. Apolonio Schneiders noted that the wound and drainage was extremely foul-smelling and asked for my assistance with empiric antibiotic therapy.   Review of  Systems: Review of systems not obtained due to patient factors.  Past Medical History  Diagnosis Date  . Diabetes mellitus without complication     History  Substance Use Topics  . Smoking status: Current Every Day Smoker -- 1.00 packs/day  . Smokeless tobacco: Not on file  . Alcohol Use: Yes    No family history on file. No Known Allergies  OBJECTIVE: Blood pressure 141/76, pulse 130, temperature 98.9 F (37.2 C), temperature source Oral, resp. rate 16, height 5\' 9"  (1.753 m), weight 86.183 kg (190 lb), SpO2 96.00%. General: He is sedated and just out of the OR. He is not able to answer questions Skin: No generalized rash Lungs: Clear Cor: Regular S1 and S2 no murmurs Abdomen: Not obviously tender Left arm dressed  Lab Results  Component Value Date   WBC 22.2* 10/12/2012   HGB 10.1* 10/12/2012   HCT 29.6* 10/12/2012   MCV 83.4 10/12/2012   PLT 338 10/12/2012   BMET    Component Value Date/Time   NA 130* 10/12/2012 0951   K 3.7 10/12/2012 0951   CL 95* 10/12/2012 0951   CO2 22 10/12/2012 0951   GLUCOSE 216* 10/12/2012 1341   BUN 15 10/12/2012 0951   CREATININE 1.71* 10/12/2012 0951   CALCIUM 8.6  10/12/2012 0951   GFRNONAA 51* 10/12/2012 0951   GFRAA 59* 10/12/2012 0951   No results found for this basename: ALT, AST, GGT, ALKPHOS, BILITOT   Microbiology: Recent Results (from the past 240 hour(s))  CULTURE, BLOOD (ROUTINE X 2)     Status: None   Collection Time    10/11/12  3:00 AM      Result Value Range Status   Specimen Description BLOOD RIGHT WRIST   Final   Special Requests BOTTLES DRAWN AEROBIC ONLY 10CC   Final   Culture  Setup Time 10/11/2012 18:12   Final   Culture     Final   Value:        BLOOD CULTURE RECEIVED NO GROWTH TO DATE CULTURE WILL BE HELD FOR 5 DAYS BEFORE ISSUING A FINAL NEGATIVE REPORT   Report Status PENDING   Incomplete  CULTURE, BLOOD (ROUTINE X 2)     Status: None   Collection Time    10/11/12  3:15 AM      Result Value Range Status    Specimen Description BLOOD RIGHT ARM   Final   Special Requests BOTTLES DRAWN AEROBIC ONLY 6CC    Final   Culture  Setup Time 10/11/2012 18:13   Final   Culture     Final   Value:        BLOOD CULTURE RECEIVED NO GROWTH TO DATE CULTURE WILL BE HELD FOR 5 DAYS BEFORE ISSUING A FINAL NEGATIVE REPORT   Report Status PENDING   Incomplete  WOUND CULTURE     Status: None   Collection Time    10/11/12 10:32 AM      Result Value Range Status   Specimen Description WOUND LEFT ARM   Final   Special Requests SUPERFICIAL   Final   Gram Stain     Final   Value: RARE WBC PRESENT,BOTH PMN AND MONONUCLEAR     NO SQUAMOUS EPITHELIAL CELLS SEEN     FEW GRAM POSITIVE COCCI     IN PAIRS IN CLUSTERS   Culture Culture reincubated for better growth   Final   Report Status PENDING   Incomplete  AFB CULTURE WITH SMEAR     Status: None   Collection Time    10/11/12 10:32 AM      Result Value Range Status   Specimen Description WOUND LEFT ARM   Final   Special Requests SUPERFICIAL   Final   ACID FAST SMEAR NO ACID FAST BACILLI SEEN   Final   Culture     Final   Value: CULTURE WILL BE EXAMINED FOR 6 WEEKS BEFORE ISSUING A FINAL REPORT   Report Status PENDING   Incomplete  ANAEROBIC CULTURE     Status: None   Collection Time    10/11/12 10:32 AM      Result Value Range Status   Specimen Description WOUND LEFT ARM   Final   Special Requests SUPERFICIAL   Final   Gram Stain PENDING   Incomplete   Culture     Final   Value: NO ANAEROBES ISOLATED; CULTURE IN PROGRESS FOR 5 DAYS   Report Status PENDING   Incomplete  WOUND CULTURE     Status: None   Collection Time    10/11/12 10:32 AM      Result Value Range Status   Specimen Description WOUND LEFT ARM   Final   Special Requests DEEP   Final   Gram Stain     Final  Value: RARE WBC PRESENT,BOTH PMN AND MONONUCLEAR     RARE SQUAMOUS EPITHELIAL CELLS PRESENT     FEW GRAM POSITIVE COCCI     IN PAIRS IN CLUSTERS FEW GRAM NEGATIVE RODS   Culture Culture  reincubated for better growth   Final   Report Status PENDING   Incomplete  AFB CULTURE WITH SMEAR     Status: None   Collection Time    10/11/12 10:32 AM      Result Value Range Status   Specimen Description WOUND LEFT ARM   Final   Special Requests DEEP   Final   ACID FAST SMEAR NO ACID FAST BACILLI SEEN   Final   Culture     Final   Value: CULTURE WILL BE EXAMINED FOR 6 WEEKS BEFORE ISSUING A FINAL REPORT   Report Status PENDING   Incomplete  ANAEROBIC CULTURE     Status: None   Collection Time    10/11/12 10:32 AM      Result Value Range Status   Specimen Description WOUND LEFT ARM   Final   Special Requests DEEP   Final   Gram Stain PENDING   Incomplete   Culture     Final   Value: NO ANAEROBES ISOLATED; CULTURE IN PROGRESS FOR 5 DAYS   Report Status PENDING   Incomplete    Michel Bickers, MD [facility] [phone] pager   [phone] cell 10/12/2012, 3:56 PM

## 2012-10-12 NOTE — Progress Notes (Signed)
PT SEEN/EXAMINED PLAN FOR REPEAT I/D TODAY GIVEN SEVERITY OF INFECTION AND WILL CLOSE OVER DRAINS R/B/A DISCUSSED WITH PT AT BEDSIDE.  PT VOICED UNDERSTANDING OF PLAN CONSENT SIGNED DAY OF SURGERY PT SEEN AND EXAMINED PRIOR TO OPERATIVE PROCEDURE/DAY OF SURGERY SITE MARKED. QUESTIONS ANSWERED WILL REMAIN AN INPATIENT FOLLOWING SURGERY

## 2012-10-12 NOTE — Brief Op Note (Signed)
10/11/2012 - 10/12/2012  11:46 AM  PATIENT:  Luke [name]  32 y.o. male  PRE-OPERATIVE DIAGNOSIS: Left forearm abscess  POST-OPERATIVE DIAGNOSIS: Left forearm abscess  PROCEDURE:  Procedure(s): IRRIGATION AND DEBRIDEMENT FOREARM (Left)  SURGEON:  Surgeon(s) and Role:    * Linna Hoff, MD - Primary  PHYSICIAN ASSISTANT:   ASSISTANTS: none   ANESTHESIA:   general  EBL:   minimal  BLOOD ADMINISTERED:none  DRAINS: Penrose drain in the left arm   LOCAL MEDICATIONS USED:  NONE  SPECIMEN:  No Specimen  DISPOSITION OF SPECIMEN:  N/A  COUNTS:  YES  TOURNIQUET:    DICTATION: [id number]  PLAN OF CARE: Admit to inpatient   PATIENT DISPOSITION:  PACU - hemodynamically stable.   Delay start of Pharmacological VTE agent (>24hrs) due to surgical blood loss or risk of bleeding: not applicable

## 2012-10-12 NOTE — Anesthesia Procedure Notes (Signed)
Procedure Name: LMA Insertion Date/Time: 10/12/2012 12:13 PM Performed by: Marinda Elk A Pre-anesthesia Checklist: Patient identified, Timeout performed, Emergency Drugs available, Suction available and Patient being monitored Patient Re-evaluated:Patient Re-evaluated prior to inductionOxygen Delivery Method: Circle system utilized Preoxygenation: Pre-oxygenation with 100% oxygen Intubation Type: IV induction Ventilation: Mask ventilation without difficulty LMA: LMA inserted LMA Size: 5.0 Number of attempts: 1 Placement Confirmation: breath sounds checked- equal and bilateral and positive ETCO2 Tube secured with: Tape Dental Injury: Teeth and Oropharynx as per pre-operative assessment

## 2012-10-12 NOTE — Progress Notes (Signed)
[facility] PROGRESS NOTE  Luke [name] [id number] DOB: 09-23-1980 DOA: 10/11/2012 PCP: Default, Provider, MD  Assessment/Plan: 1. Cellulitis: s/p I&d in OR on 7/26 and again on 7/27; cipro/vanc- wound culture pending 2. IV drug abuse: encourage cessation 3. DM: once no longer NPO adjust medications for full coverage 4. HTN: stable 5. Pain: spoke with patient for a long time that he would not be 100% pain free; IV Toradol and dilaudid PRN 6. AKI- await AM labs 7. Leukocytosis: see #1- await AM labs 8. Hyponatremia- labs and monitor  Code Status: full Family Communication: patient Disposition Plan:    Consultants:  ortho  Procedures:  I&D   Antibiotics: vanc/cipro  HPI/Subjective: Still having pain but able to rest  Objective: Filed Vitals:   10/11/12 1226 10/11/12 1418 10/11/12 2055 10/12/12 0625  BP: 130/84 137/74 123/70 120/77  Pulse: 132 131 130 125  Temp: 99.2 F (37.3 C) 100.5 F (38.1 C) 99.4 F (37.4 C) 99 F (37.2 C)  TempSrc: Oral Oral Oral Oral  Resp: 18 18 18 18   Height:      Weight:      SpO2: 97% 99% 98% 98%    Intake/Output Summary (Last 24 hours) at 10/12/12 0943 Last data filed at 10/11/12 1339  Gross per 24 hour  Intake   1000 ml  Output    500 ml  Net    500 ml   Filed Weights   10/10/12 2354  Weight: 86.183 kg (190 lb)    Exam:   General:  sleepy  Cardiovascular: rrr  Respiratory: clear anterior  Abdomen: +BS, soft , NT  Musculoskeletal: moves all 4 ext, left arm swollen and has ace wrap   Data Reviewed: Basic Metabolic Panel:  Recent Labs Lab 10/11/12 0057  NA 131*  K 3.9  CL 97  GLUCOSE 393*  BUN 15  CREATININE 1.60*   Liver Function Tests: No results found for this basename: AST, ALT, ALKPHOS, BILITOT, PROT, ALBUMIN,  in the last 168 hours No results found for this basename: LIPASE, AMYLASE,  in the last 168 hours No results found for this basename: AMMONIA,  in the last 168  hours CBC:  Recent Labs Lab 10/11/12 0012 10/11/12 0057  WBC 30.7*  --   NEUTROABS 25.4*  --   HGB 13.1 13.9  HCT 35.9* 41.0  MCV 82.7  --   PLT 469*  --    Cardiac Enzymes: No results found for this basename: CKTOTAL, CKMB, CKMBINDEX, TROPONINI,  in the last 168 hours BNP (last 3 results) No results found for this basename: PROBNP,  in the last 8760 hours CBG:  Recent Labs Lab 10/11/12 1616 10/11/12 2052 10/12/12 0059 10/12/12 0623 10/12/12 0815  GLUCAP 288* 172* 192* 270* 322*    Recent Results (from the past 240 hour(s))  WOUND CULTURE     Status: None   Collection Time    10/11/12 10:32 AM      Result Value Range Status   Specimen Description WOUND LEFT ARM   Final   Special Requests SUPERFICIAL   Final   Gram Stain PENDING   Incomplete   Culture Culture reincubated for better growth   Final   Report Status PENDING   Incomplete  WOUND CULTURE     Status: None   Collection Time    10/11/12 10:32 AM      Result Value Range Status   Specimen Description WOUND LEFT ARM   Final   Special Requests DEEP  Final   Gram Stain PENDING   Incomplete   Culture Culture reincubated for better growth   Final   Report Status PENDING   Incomplete     Studies: No results found.  Scheduled Meds: . alprazolam  3 mg Oral QHS  . ciprofloxacin  400 mg Intravenous BID  . docusate sodium  100 mg Oral BID  . heparin  5,000 Units Subcutaneous Q8H  . insulin aspart  0-15 Units Subcutaneous TID WC  . insulin aspart protamine- aspart  45 Units Subcutaneous Q supper  . insulin aspart protamine- aspart  55 Units Subcutaneous Q breakfast  . ketorolac  30 mg Intravenous Q6H  . lisinopril  10 mg Oral Daily  . pantoprazole  40 mg Oral Daily  . vancomycin  1,000 mg Intravenous Q8H   Continuous Infusions: . sodium chloride 100 mL/hr at 10/11/12 1627    Principal Problem:   Cellulitis and abscess Active Problems:   Drug abuse, IV   Tobacco abuse   DM (dermatomyositis)   HTN  (hypertension)    Time spent: 35 min    Aggie Douse  [facility] Pager [phone]. If 7PM-7AM, please contact night-coverage at [email or web address], password [facility] 10/12/2012, 9:43 AM  LOS: 1 day

## 2012-10-12 NOTE — Preoperative (Signed)
Beta Blockers   Reason not to administer Beta Blockers:Pt. not on beta blocker

## 2012-10-13 ENCOUNTER — Encounter (HOSPITAL_COMMUNITY): Payer: Self-pay | Admitting: *Deleted

## 2012-10-13 ENCOUNTER — Inpatient Hospital Stay (HOSPITAL_COMMUNITY)
Admission: EM | Admit: 2012-10-13 | Discharge: 2012-10-23 | DRG: 580 | Disposition: A | Discharge status: Home / Self Care | Payer: Medicaid Other | Attending: Internal Medicine | Admitting: Internal Medicine

## 2012-10-13 ENCOUNTER — Inpatient Hospital Stay (HOSPITAL_COMMUNITY): Payer: Medicaid Other

## 2012-10-13 DIAGNOSIS — I10 Essential (primary) hypertension: Secondary | ICD-10-CM

## 2012-10-13 DIAGNOSIS — K219 Gastro-esophageal reflux disease without esophagitis: Secondary | ICD-10-CM | POA: Diagnosis present

## 2012-10-13 DIAGNOSIS — E1165 Type 2 diabetes mellitus with hyperglycemia: Secondary | ICD-10-CM | POA: Diagnosis present

## 2012-10-13 DIAGNOSIS — Z794 Long term (current) use of insulin: Secondary | ICD-10-CM

## 2012-10-13 DIAGNOSIS — F17200 Nicotine dependence, unspecified, uncomplicated: Secondary | ICD-10-CM | POA: Diagnosis present

## 2012-10-13 DIAGNOSIS — F1410 Cocaine abuse, uncomplicated: Secondary | ICD-10-CM | POA: Diagnosis present

## 2012-10-13 DIAGNOSIS — IMO0002 Reserved for concepts with insufficient information to code with codable children: Principal | ICD-10-CM | POA: Diagnosis present

## 2012-10-13 DIAGNOSIS — N184 Chronic kidney disease, stage 4 (severe): Secondary | ICD-10-CM | POA: Diagnosis present

## 2012-10-13 DIAGNOSIS — F1910 Other psychoactive substance abuse, uncomplicated: Secondary | ICD-10-CM | POA: Diagnosis present

## 2012-10-13 DIAGNOSIS — L0291 Cutaneous abscess, unspecified: Secondary | ICD-10-CM

## 2012-10-13 DIAGNOSIS — N179 Acute kidney failure, unspecified: Secondary | ICD-10-CM

## 2012-10-13 DIAGNOSIS — L0390 Cellulitis, unspecified: Secondary | ICD-10-CM

## 2012-10-13 DIAGNOSIS — F411 Generalized anxiety disorder: Secondary | ICD-10-CM | POA: Diagnosis present

## 2012-10-13 DIAGNOSIS — N189 Chronic kidney disease, unspecified: Secondary | ICD-10-CM

## 2012-10-13 DIAGNOSIS — E1129 Type 2 diabetes mellitus with other diabetic kidney complication: Secondary | ICD-10-CM

## 2012-10-13 DIAGNOSIS — Z79899 Other long term (current) drug therapy: Secondary | ICD-10-CM

## 2012-10-13 DIAGNOSIS — D72829 Elevated white blood cell count, unspecified: Secondary | ICD-10-CM | POA: Diagnosis present

## 2012-10-13 DIAGNOSIS — D631 Anemia in chronic kidney disease: Secondary | ICD-10-CM | POA: Diagnosis present

## 2012-10-13 DIAGNOSIS — I129 Hypertensive chronic kidney disease with stage 1 through stage 4 chronic kidney disease, or unspecified chronic kidney disease: Secondary | ICD-10-CM | POA: Diagnosis present

## 2012-10-13 LAB — BASIC METABOLIC PANEL
BUN: 18 mg/dL (ref 6–23)
Chloride: 99 mEq/L (ref 96–112)
Creatinine, Ser: 2.64 mg/dL — ABNORMAL HIGH (ref 0.50–1.35)
GFR calc Af Amer: 35 mL/min — ABNORMAL LOW (ref >90)
GFR calc non Af Amer: 30 mL/min — ABNORMAL LOW (ref >90)
Potassium: 4 mEq/L (ref 3.5–5.1)

## 2012-10-13 LAB — CBC
MCHC: 33.7 g/dL (ref 30.0–36.0)
Platelets: 354 10*3/uL (ref 150–400)
RDW: 13.6 % (ref 11.5–15.5)
WBC: 21.9 10*3/uL — ABNORMAL HIGH (ref 4.0–10.5)

## 2012-10-13 LAB — WOUND CULTURE

## 2012-10-13 LAB — GLUCOSE, RANDOM: Glucose, Bld: 481 mg/dL — ABNORMAL HIGH (ref 70–99)

## 2012-10-13 LAB — GLUCOSE, CAPILLARY: Glucose-Capillary: 415 mg/dL — ABNORMAL HIGH (ref 70–99)

## 2012-10-13 MED ORDER — MORPHINE SULFATE ER 15 MG PO TBCR
60.0000 mg | EXTENDED_RELEASE_TABLET | Freq: Two times a day (BID) | ORAL | Status: DC
  Administered 2012-10-13: 60 mg via ORAL
  Filled 2012-10-13: qty 4

## 2012-10-13 MED ORDER — INSULIN ASPART 100 UNIT/ML ~~LOC~~ SOLN: 0.0000 [IU] | Freq: Every day | SUBCUTANEOUS | Status: DC

## 2012-10-13 MED ORDER — INSULIN ASPART 100 UNIT/ML ~~LOC~~ SOLN
0.0000 [IU] | Freq: Three times a day (TID) | SUBCUTANEOUS | Status: DC
  Administered 2012-10-13: 20 [IU] via SUBCUTANEOUS

## 2012-10-13 MED ORDER — INSULIN ASPART PROT & ASPART (70-30 MIX) 100 UNIT/ML ~~LOC~~ SUSP: 50.0000 [IU] | Freq: Every day | SUBCUTANEOUS | Status: DC

## 2012-10-13 MED ORDER — INSULIN ASPART 100 UNIT/ML ~~LOC~~ SOLN: 4.0000 [IU] | Freq: Three times a day (TID) | SUBCUTANEOUS | Status: DC

## 2012-10-13 MED ORDER — INSULIN ASPART PROT & ASPART (70-30 MIX) 100 UNIT/ML ~~LOC~~ SUSP
65.0000 [IU] | Freq: Every day | SUBCUTANEOUS | Status: DC
  Administered 2012-10-13: 65 [IU] via SUBCUTANEOUS

## 2012-10-13 NOTE — Progress Notes (Signed)
CBG reported to RN during shift change of 415. MD notified and stat glucose lab verification put in. Still awaiting lab to result, will administer SSI after lab verification.

## 2012-10-13 NOTE — Progress Notes (Signed)
MD made aware that patient is wanting to leave AMA and after completion of forms he was discharged home with all his belongings. Patient refused to call spouse and make her aware of his decision, and refused to allow me to help with transportation home.

## 2012-10-13 NOTE — Op Note (Signed)
Luke [name], Luke [name] NO.:  [id number]  MEDICAL RECORD NO.:  [id number]  LOCATION:  [id number]                        FACILITY:  [facility]  PHYSICIAN:  Melrose Nakayama, MD  DATE OF BIRTH:  Dec 17, 1980  DATE OF PROCEDURE:  10/12/2012 DATE OF DISCHARGE:                              OPERATIVE REPORT   PREOPERATIVE DIAGNOSES: 1. Left forearm abscess, volar and ulnar. 2. Left forearm abscess, dorsal extensor musculature. 3. Intravenous drug use.  POSTOPERATIVE DIAGNOSES: 1. Left forearm abscess, volar and ulnar. 2. Left forearm abscess, dorsal extensor musculature. 3. Intravenous drug use.  SURGEON:  Melrose Nakayama, MD, who was scrubbed and present for the entire procedure.  ASSISTANT SURGEON:  None.  SURGICAL PROCEDURE: 1. Incision and drainage of left forearm deep abscess, volar and     ulnar. 2. Left forearm incision and drainage, deep abscess extensor through a     separate incision. 3. Left forearm decompressive fasciotomy and debridement of nonviable     muscle, excisional debridement of the flexor surfaces.  SURGICAL INDICATIONS:  Luke [name] is a 32 year old, diabetic who unfortunately relapsed and was injecting cocaine into his left arm.  The patient presented to the ER with a severe infection in the left upper extremity.  The patient was taken to the operating room yesterday.  He was brought back today for repeat debridement based on the severity of his infection.  Risks, benefits, and alternatives were discussed in detail with the patient, and signed informed consent was obtained.  DESCRIPTION OF PROCEDURE:  The patient was properly identified in the preoperative holding area.  A mark with a permanent marker made on left arm to indicate the correct operative site.  The patient was then brought back to the operating room, placed supine on the anesthesia room table with general anesthesia administered.  The patient was receiving IV vancomycin and  ciprofloxacin prior to anesthesia.  A well-padded tourniquet was then placed on the left brachium, sealed with 1000 drape. Left upper extremity was then prepped and draped in normal sterile fashion.  Time-out was called.  The correct site was identified, and the procedure was then begun.  The incision was then lengthened both proximally and distally because the patient still had a very foul smelling purulent discharge coming from the entire incision.  This dorsal and volar area was then opened up, and the abscess area was then drained both proximally and distally once again.  The patient did have a lot of nonviable muscle in the flexor compartment of the forearm. Excisional debridement was then carried out with knives, rongeurs, and removing the devitalized muscle.  Portions of the FDS to the long finger was gone which had left him with incompetence of the long finger FDS. Very small amount of musculature was left of the FDS.  Excisional debridement was carried out of the FCR, the palmaris longus, and the pronator, all median nerve innervated structures.  The infection had tracked all the way down from near the carpal canal all the way along the course of the median nerve up to the level of the antecubital fossa. Extensive debridement was then carried out.  Pulsatile lavage,  6 L, was then run through the arm.  Copious irrigation done after extensive sharp debridement.  Following this, the wound was then closed over Penrose drains.  Adaptic dressing and sterile compressive bandages were then applied.  The patient tolerated the procedure well and returned to recovery room in good condition.  POSTPROCEDURE PLAN:  The patient has a very severe infection on the upper extremity, one that could even take the loss of his arm if we do not stay on top of it.  The patient will return to the operating room within likely 48 hours for repeat I and D.  The patient may require multiple trips back to the  operating room for the significant infection. We still have a very guarded prognosis in terms of the use and function in the left upper extremity.  We will continue to stay aggressive in terms of IV therapy and returning back to the operating room for debridement.     Melrose Nakayama, MD     FWO/MEDQ  D:  10/12/2012  T:  10/12/2012  Job:  [id number]

## 2012-10-13 NOTE — ED Notes (Signed)
Pt in stating he was admitted to the hospital over the weekend and had a large abscess to his left elbow/forearm, pt went to surgery for I and D twice over the weekend and left AMA this morning, pt still has drains in place, in tonight due to continued pain, pt states he was supposed to have surgery again tomorrow and have continued IV antibiotics

## 2012-10-13 NOTE — Progress Notes (Signed)
Patient in bathroom smoking cigarette when RN and NT came to check on him. Immediately told to put the cigarette out. Became irate and insisting on leaving. MD called and made aware of patient wanting to leave. We will follow protocol for patients wanting to leave AMA and patient has every intention of leaving at this time.

## 2012-10-13 NOTE — Progress Notes (Addendum)
[facility] PROGRESS NOTE  Luke [name] [id number] DOB: 09/14/1980 DOA: 10/11/2012 PCP: Default, Provider, MD  Assessment/Plan: Cellulitis: s/p I&d in OR on 7/26 and again on 7/27; ID following- wound culture pending:gram-positive cocci and gram-negative rods  IV drug abuse: encourage cessation  DM: increasing 70/30; SSI- 10.1  HTN: stable  Pain: spoke with patient for a long time that he would not be 100% pain free; resume home meds and continue PRN IV pain meds  AKI- CR not improving, IVF; renal u/s- hold lisinopril and tordol  Leukocytosis: see #1- await AM labs, improving  Hyponatremia- labs and monitor  Code Status: full Family Communication: patient Disposition Plan:    Consultants:  Ortho  Id   Procedures:  I&D   Antibiotics: vanc/unasyn  HPI/Subjective: Lots of drainage from wound  Objective: Filed Vitals:   10/12/12 1458 10/12/12 1525 10/12/12 2007 10/13/12 0620  BP: 122/76 141/76 128/58   Pulse: 125 130 126 122  Temp:  98.9 F (37.2 C) 100.3 F (37.9 C) 99.2 F (37.3 C)  TempSrc:  Oral Oral Oral  Resp:  16 16 16   Height:      Weight:      SpO2: 97% 96% 95% 92%    Intake/Output Summary (Last 24 hours) at 10/13/12 0949 Last data filed at 10/13/12 0742  Gross per 24 hour  Intake   1020 ml  Output     30 ml  Net    990 ml   Filed Weights   10/10/12 2354  Weight: 86.183 kg (190 lb)    Exam:   General:  sleepy  Cardiovascular: rrr  Respiratory: clear anterior  Abdomen: +BS, soft , NT  Musculoskeletal: moves all 4 ext, left arm swollen and has ace wrap   Data Reviewed: Basic Metabolic Panel:  Recent Labs Lab 10/11/12 0057 10/12/12 0951 10/12/12 1341  NA 131* 130*  --   K 3.9 3.7  --   CL 97 95*  --   CO2  --  22  --   GLUCOSE 393* 309* 216*  BUN 15 15  --   CREATININE 1.60* 1.71*  --   CALCIUM  --  8.6  --    Liver Function Tests: No results found for this basename: AST, ALT, ALKPHOS, BILITOT, PROT,  ALBUMIN,  in the last 168 hours No results found for this basename: LIPASE, AMYLASE,  in the last 168 hours No results found for this basename: AMMONIA,  in the last 168 hours CBC:  Recent Labs Lab 10/11/12 0012 10/11/12 0057 10/12/12 0951 10/13/12 0815  WBC 30.7*  --  22.2* 21.9*  NEUTROABS 25.4*  --   --   --   HGB 13.1 13.9 10.1* 8.2*  HCT 35.9* 41.0 29.6* 24.3*  MCV 82.7  --  83.4 84.1  PLT 469*  --  338 354   Cardiac Enzymes: No results found for this basename: CKTOTAL, CKMB, CKMBINDEX, TROPONINI,  in the last 168 hours BNP (last 3 results) No results found for this basename: PROBNP,  in the last 8760 hours CBG:  Recent Labs Lab 10/12/12 0815 10/12/12 1557 10/12/12 2005 10/13/12 0420 10/13/12 0736  GLUCAP 322* 198* 181* 340* 415*    Recent Results (from the past 240 hour(s))  CULTURE, BLOOD (ROUTINE X 2)     Status: None   Collection Time    10/11/12  3:00 AM      Result Value Range Status   Specimen Description BLOOD RIGHT WRIST   Final  Special Requests BOTTLES DRAWN AEROBIC ONLY 10CC   Final   Culture  Setup Time 10/11/2012 18:12   Final   Culture     Final   Value:        BLOOD CULTURE RECEIVED NO GROWTH TO DATE CULTURE WILL BE HELD FOR 5 DAYS BEFORE ISSUING A FINAL NEGATIVE REPORT   Report Status PENDING   Incomplete  CULTURE, BLOOD (ROUTINE X 2)     Status: None   Collection Time    10/11/12  3:15 AM      Result Value Range Status   Specimen Description BLOOD RIGHT ARM   Final   Special Requests BOTTLES DRAWN AEROBIC ONLY 6CC    Final   Culture  Setup Time 10/11/2012 18:13   Final   Culture     Final   Value:        BLOOD CULTURE RECEIVED NO GROWTH TO DATE CULTURE WILL BE HELD FOR 5 DAYS BEFORE ISSUING A FINAL NEGATIVE REPORT   Report Status PENDING   Incomplete  WOUND CULTURE     Status: None   Collection Time    10/11/12 10:32 AM      Result Value Range Status   Specimen Description WOUND LEFT ARM   Final   Special Requests SUPERFICIAL   Final    Gram Stain     Final   Value: RARE WBC PRESENT,BOTH PMN AND MONONUCLEAR     NO SQUAMOUS EPITHELIAL CELLS SEEN     FEW GRAM POSITIVE COCCI     IN PAIRS IN CLUSTERS   Culture     Final   Value: MULTIPLE ORGANISMS PRESENT, NONE PREDOMINANT     Note: NO STAPHYLOCOCCUS AUREUS ISOLATED NO GROUP A STREP (S.PYOGENES) ISOLATED   Report Status 10/13/2012 FINAL   Final  AFB CULTURE WITH SMEAR     Status: None   Collection Time    10/11/12 10:32 AM      Result Value Range Status   Specimen Description WOUND LEFT ARM   Final   Special Requests SUPERFICIAL   Final   ACID FAST SMEAR NO ACID FAST BACILLI SEEN   Final   Culture     Final   Value: CULTURE WILL BE EXAMINED FOR 6 WEEKS BEFORE ISSUING A FINAL REPORT   Report Status PENDING   Incomplete  ANAEROBIC CULTURE     Status: None   Collection Time    10/11/12 10:32 AM      Result Value Range Status   Specimen Description WOUND LEFT ARM   Final   Special Requests SUPERFICIAL   Final   Gram Stain PENDING   Incomplete   Culture     Final   Value: NO ANAEROBES ISOLATED; CULTURE IN PROGRESS FOR 5 DAYS   Report Status PENDING   Incomplete  WOUND CULTURE     Status: None   Collection Time    10/11/12 10:32 AM      Result Value Range Status   Specimen Description WOUND LEFT ARM   Final   Special Requests DEEP   Final   Gram Stain     Final   Value: RARE WBC PRESENT,BOTH PMN AND MONONUCLEAR     RARE SQUAMOUS EPITHELIAL CELLS PRESENT     FEW GRAM POSITIVE COCCI     IN PAIRS IN CLUSTERS FEW GRAM NEGATIVE RODS   Culture     Final   Value: MULTIPLE ORGANISMS PRESENT, NONE PREDOMINANT     Note: NO STAPHYLOCOCCUS AUREUS  ISOLATED NO GROUP A STREP (S.PYOGENES) ISOLATED   Report Status 10/13/2012 FINAL   Final  AFB CULTURE WITH SMEAR     Status: None   Collection Time    10/11/12 10:32 AM      Result Value Range Status   Specimen Description WOUND LEFT ARM   Final   Special Requests DEEP   Final   ACID FAST SMEAR NO ACID FAST BACILLI SEEN    Final   Culture     Final   Value: CULTURE WILL BE EXAMINED FOR 6 WEEKS BEFORE ISSUING A FINAL REPORT   Report Status PENDING   Incomplete  ANAEROBIC CULTURE     Status: None   Collection Time    10/11/12 10:32 AM      Result Value Range Status   Specimen Description WOUND LEFT ARM   Final   Special Requests DEEP   Final   Gram Stain PENDING   Incomplete   Culture     Final   Value: NO ANAEROBES ISOLATED; CULTURE IN PROGRESS FOR 5 DAYS   Report Status PENDING   Incomplete     Studies: No results found.  Scheduled Meds: . alprazolam  3 mg Oral QHS  . ampicillin-sulbactam (UNASYN) IV  3 g Intravenous Q6H  . docusate sodium  100 mg Oral BID  . heparin  5,000 Units Subcutaneous Q8H  . insulin aspart  0-20 Units Subcutaneous TID WC  . insulin aspart  0-5 Units Subcutaneous QHS  . insulin aspart protamine- aspart  50 Units Subcutaneous Q supper  . insulin aspart protamine- aspart  65 Units Subcutaneous Q breakfast  . morphine  60 mg Oral Q12H  . pantoprazole  40 mg Oral Daily  . vancomycin  1,000 mg Intravenous Q8H   Continuous Infusions: . sodium chloride 100 mL/hr at 10/12/12 1704    Principal Problem:   Cellulitis and abscess Active Problems:   Drug abuse, IV   Tobacco abuse   DM (dermatomyositis)   HTN (hypertension)    Time spent: 35 min    Luke [name]  [facility] Pager [phone]. If 7PM-7AM, please contact night-coverage at [email or web address], password [facility] 10/13/2012, 9:49 AM  LOS: 2 days

## 2012-10-13 NOTE — ED Notes (Signed)
Up to desk, c/o chills. Given light sheet d/t possible fever. VS re-checked.

## 2012-10-13 NOTE — Progress Notes (Signed)
UR COMPLETED  

## 2012-10-14 ENCOUNTER — Inpatient Hospital Stay (HOSPITAL_COMMUNITY): Admission: RE | Admit: 2012-10-14 | Payer: Self-pay | Source: Ambulatory Visit | Admitting: Orthopedic Surgery

## 2012-10-14 ENCOUNTER — Encounter (HOSPITAL_COMMUNITY)
Admission: EM | Disposition: A | Discharge status: Home / Self Care | Payer: Self-pay | Source: Home / Self Care | Attending: Internal Medicine

## 2012-10-14 ENCOUNTER — Inpatient Hospital Stay (HOSPITAL_COMMUNITY): Payer: Medicaid Other | Admitting: *Deleted

## 2012-10-14 ENCOUNTER — Encounter (HOSPITAL_COMMUNITY): Payer: Self-pay | Admitting: *Deleted

## 2012-10-14 ENCOUNTER — Encounter (HOSPITAL_COMMUNITY): Payer: Self-pay | Admitting: Internal Medicine

## 2012-10-14 DIAGNOSIS — N179 Acute kidney failure, unspecified: Secondary | ICD-10-CM

## 2012-10-14 DIAGNOSIS — L0390 Cellulitis, unspecified: Secondary | ICD-10-CM

## 2012-10-14 DIAGNOSIS — D72829 Elevated white blood cell count, unspecified: Secondary | ICD-10-CM | POA: Diagnosis present

## 2012-10-14 DIAGNOSIS — I10 Essential (primary) hypertension: Secondary | ICD-10-CM

## 2012-10-14 DIAGNOSIS — L0291 Cutaneous abscess, unspecified: Secondary | ICD-10-CM | POA: Diagnosis present

## 2012-10-14 HISTORY — DX: Unspecified convulsions: R56.9

## 2012-10-14 HISTORY — DX: Anxiety disorder, unspecified: F41.9

## 2012-10-14 HISTORY — DX: Pneumonia, unspecified organism: J18.9

## 2012-10-14 HISTORY — DX: Gastro-esophageal reflux disease without esophagitis: K21.9

## 2012-10-14 HISTORY — PX: I & D EXTREMITY: SHX5045

## 2012-10-14 HISTORY — DX: Headache: R51

## 2012-10-14 LAB — COMPREHENSIVE METABOLIC PANEL
BUN: 16 mg/dL (ref 6–23)
CO2: 24 mEq/L (ref 19–32)
Chloride: 103 mEq/L (ref 96–112)
Creatinine, Ser: 2.47 mg/dL — ABNORMAL HIGH (ref 0.50–1.35)
GFR calc non Af Amer: 33 mL/min — ABNORMAL LOW (ref >90)
Glucose, Bld: 245 mg/dL — ABNORMAL HIGH (ref 70–99)
Total Bilirubin: 0.2 mg/dL — ABNORMAL LOW (ref 0.3–1.2)

## 2012-10-14 LAB — POCT I-STAT, CHEM 8
Calcium, Ion: 0.97 mmol/L — ABNORMAL LOW (ref 1.12–1.23)
Chloride: 103 mEq/L (ref 96–112)
HCT: 26 % — ABNORMAL LOW (ref 39.0–52.0)
Hemoglobin: 8.8 g/dL — ABNORMAL LOW (ref 13.0–17.0)
Potassium: 3.7 mEq/L (ref 3.5–5.1)

## 2012-10-14 LAB — CBC WITH DIFFERENTIAL/PLATELET
Eosinophils Relative: 1 % (ref 0–5)
HCT: 22.4 % — ABNORMAL LOW (ref 39.0–52.0)
Lymphs Abs: 3.4 10*3/uL (ref 0.7–4.0)
MCV: 82.7 fL (ref 78.0–100.0)
Monocytes Relative: 12 % (ref 3–12)
Neutro Abs: 10.8 10*3/uL — ABNORMAL HIGH (ref 1.7–7.7)
RBC: 2.71 MIL/uL — ABNORMAL LOW (ref 4.22–5.81)
WBC: 16.4 10*3/uL — ABNORMAL HIGH (ref 4.0–10.5)

## 2012-10-14 LAB — GLUCOSE, CAPILLARY
Glucose-Capillary: 290 mg/dL — ABNORMAL HIGH (ref 70–99)
Glucose-Capillary: 219 mg/dL — ABNORMAL HIGH (ref 70–99)
Glucose-Capillary: 191 mg/dL — ABNORMAL HIGH (ref 70–99)

## 2012-10-14 LAB — CBC
HCT: 25.5 % — ABNORMAL LOW (ref 39.0–52.0)
Hemoglobin: 8.6 g/dL — ABNORMAL LOW (ref 13.0–17.0)
MCH: 28.2 pg (ref 26.0–34.0)
MCHC: 33.7 g/dL (ref 30.0–36.0)
RDW: 13.9 % (ref 11.5–15.5)

## 2012-10-14 LAB — PROTIME-INR
INR: 1.07 (ref 0.00–1.49)
Prothrombin Time: 13.7 seconds (ref 11.6–15.2)

## 2012-10-14 SURGERY — IRRIGATION AND DEBRIDEMENT EXTREMITY
Anesthesia: General | Site: Arm Upper | Laterality: Left | Wound class: Dirty or Infected

## 2012-10-14 MED ORDER — HEPARIN SODIUM (PORCINE) 5000 UNIT/ML IJ SOLN
5000.0000 [IU] | Freq: Three times a day (TID) | INTRAMUSCULAR | Status: DC
  Administered 2012-10-14 – 2012-10-23 (×23): 5000 [IU] via SUBCUTANEOUS
  Filled 2012-10-14 (×31): qty 1

## 2012-10-14 MED ORDER — POTASSIUM CHLORIDE IN NACL 20-0.9 MEQ/L-% IV SOLN
INTRAVENOUS | Status: DC
  Administered 2012-10-14 – 2012-10-15 (×2): via INTRAVENOUS
  Filled 2012-10-14 (×6): qty 1000

## 2012-10-14 MED ORDER — PANTOPRAZOLE SODIUM 40 MG PO TBEC
40.0000 mg | DELAYED_RELEASE_TABLET | Freq: Every day | ORAL | Status: DC
  Administered 2012-10-14 – 2012-10-23 (×8): 40 mg via ORAL
  Filled 2012-10-14 (×6): qty 1

## 2012-10-14 MED ORDER — HYDROMORPHONE HCL PF 1 MG/ML IJ SOLN
INTRAMUSCULAR | Status: DC | PRN
Start: 2012-10-14 — End: 2012-10-14
  Administered 2012-10-14 (×2): 0.5 mg via INTRAVENOUS

## 2012-10-14 MED ORDER — CIPROFLOXACIN IN D5W 400 MG/200ML IV SOLN
400.0000 mg | Freq: Once | INTRAVENOUS | Status: AC
  Administered 2012-10-14: 400 mg via INTRAVENOUS
  Filled 2012-10-14: qty 200

## 2012-10-14 MED ORDER — FENTANYL CITRATE 0.05 MG/ML IJ SOLN
INTRAMUSCULAR | Status: DC | PRN
  Administered 2012-10-14: 100 ug via INTRAVENOUS
  Administered 2012-10-14 (×5): 50 ug via INTRAVENOUS
  Administered 2012-10-14: 100 ug via INTRAVENOUS
  Administered 2012-10-14: 50 ug via INTRAVENOUS

## 2012-10-14 MED ORDER — CHLORHEXIDINE GLUCONATE 4 % EX LIQD
60.0000 mL | Freq: Once | CUTANEOUS | Status: AC
  Administered 2012-10-14: 4 via TOPICAL
  Filled 2012-10-14: qty 60

## 2012-10-14 MED ORDER — PROPOFOL 10 MG/ML IV BOLUS
INTRAVENOUS | Status: DC | PRN
  Administered 2012-10-14: 200 mg via INTRAVENOUS

## 2012-10-14 MED ORDER — MIDAZOLAM HCL 5 MG/5ML IJ SOLN
INTRAMUSCULAR | Status: DC | PRN
  Administered 2012-10-14: 2 mg via INTRAVENOUS

## 2012-10-14 MED ORDER — MIDAZOLAM HCL 2 MG/2ML IJ SOLN
2.0000 mg | Freq: Once | INTRAMUSCULAR | Status: AC
  Administered 2012-10-14: 2 mg via INTRAVENOUS

## 2012-10-14 MED ORDER — ONDANSETRON HCL 4 MG/2ML IJ SOLN: 4.0000 mg | Freq: Four times a day (QID) | INTRAMUSCULAR | Status: DC | PRN

## 2012-10-14 MED ORDER — ACETAMINOPHEN 325 MG PO TABS
650.0000 mg | ORAL_TABLET | Freq: Four times a day (QID) | ORAL | Status: DC | PRN
  Administered 2012-10-14 – 2012-10-19 (×7): 650 mg via ORAL
  Filled 2012-10-14 (×7): qty 2

## 2012-10-14 MED ORDER — ALPRAZOLAM 0.5 MG PO TABS
1.0000 mg | ORAL_TABLET | Freq: Two times a day (BID) | ORAL | Status: DC | PRN
  Administered 2012-10-14 – 2012-10-17 (×6): 1 mg via ORAL
  Filled 2012-10-14 (×2): qty 2
  Filled 2012-10-14: qty 1
  Filled 2012-10-14 (×2): qty 2
  Filled 2012-10-14: qty 1
  Filled 2012-10-14: qty 2

## 2012-10-14 MED ORDER — LACTATED RINGERS IV SOLN
INTRAVENOUS | Status: DC | PRN
  Administered 2012-10-14 (×2): via INTRAVENOUS

## 2012-10-14 MED ORDER — DOCUSATE SODIUM 100 MG PO CAPS
100.0000 mg | ORAL_CAPSULE | Freq: Two times a day (BID) | ORAL | Status: DC
  Administered 2012-10-14 – 2012-10-23 (×16): 100 mg via ORAL
  Filled 2012-10-14 (×22): qty 1

## 2012-10-14 MED ORDER — INSULIN ASPART 100 UNIT/ML ~~LOC~~ SOLN: 0.0000 [IU] | Freq: Three times a day (TID) | SUBCUTANEOUS | Status: DC

## 2012-10-14 MED ORDER — SODIUM CHLORIDE 0.9 % IV SOLN
INTRAVENOUS | Status: DC
  Administered 2012-10-14 – 2012-10-15 (×2): via INTRAVENOUS
  Administered 2012-10-16: 20 mL/h via INTRAVENOUS
  Administered 2012-10-19: 20 mL via INTRAVENOUS
  Administered 2012-10-22: 20 mL/h via INTRAVENOUS

## 2012-10-14 MED ORDER — SODIUM CHLORIDE 0.9 % IV SOLN
1.5000 g | Freq: Three times a day (TID) | INTRAVENOUS | Status: DC
  Administered 2012-10-14: 1.5 g via INTRAVENOUS
  Filled 2012-10-14 (×2): qty 1.5

## 2012-10-14 MED ORDER — 0.9 % SODIUM CHLORIDE (POUR BTL) OPTIME
TOPICAL | Status: DC | PRN
  Administered 2012-10-14: 1000 mL

## 2012-10-14 MED ORDER — HYDROMORPHONE HCL PF 1 MG/ML IJ SOLN
0.5000 mg | INTRAMUSCULAR | Status: DC | PRN
  Administered 2012-10-14 (×5): 0.5 mg via INTRAVENOUS
  Filled 2012-10-14 (×4): qty 1

## 2012-10-14 MED ORDER — VANCOMYCIN HCL IN DEXTROSE 1-5 GM/200ML-% IV SOLN
1000.0000 mg | Freq: Once | INTRAVENOUS | Status: AC
  Administered 2012-10-14: 1000 mg via INTRAVENOUS
  Filled 2012-10-14: qty 200

## 2012-10-14 MED ORDER — ONDANSETRON HCL 4 MG PO TABS: 4.0000 mg | ORAL_TABLET | Freq: Four times a day (QID) | ORAL | Status: DC | PRN

## 2012-10-14 MED ORDER — INSULIN ASPART 100 UNIT/ML ~~LOC~~ SOLN
0.0000 [IU] | Freq: Every day | SUBCUTANEOUS | Status: DC
  Administered 2012-10-14: 4 [IU] via SUBCUTANEOUS

## 2012-10-14 MED ORDER — MIDAZOLAM HCL 2 MG/2ML IJ SOLN
INTRAMUSCULAR | Status: AC
  Filled 2012-10-14: qty 2

## 2012-10-14 MED ORDER — DEXMEDETOMIDINE BOLUS VIA INFUSION
1.0000 ug/kg | Freq: Once | INTRAVENOUS | Status: DC
  Filled 2012-10-14 (×2): qty 87

## 2012-10-14 MED ORDER — ACETAMINOPHEN 650 MG RE SUPP: 650.0000 mg | Freq: Four times a day (QID) | RECTAL | Status: DC | PRN

## 2012-10-14 MED ORDER — NICOTINE 14 MG/24HR TD PT24
14.0000 mg | MEDICATED_PATCH | Freq: Every day | TRANSDERMAL | Status: DC
  Administered 2012-10-14 – 2012-10-23 (×9): 14 mg via TRANSDERMAL
  Filled 2012-10-14 (×10): qty 1

## 2012-10-14 MED ORDER — MORPHINE SULFATE ER 15 MG PO TBCR
30.0000 mg | EXTENDED_RELEASE_TABLET | Freq: Two times a day (BID) | ORAL | Status: DC
  Administered 2012-10-14 – 2012-10-16 (×6): 30 mg via ORAL
  Filled 2012-10-14 (×6): qty 2

## 2012-10-14 MED ORDER — HYDROMORPHONE HCL PF 1 MG/ML IJ SOLN
0.2500 mg | INTRAMUSCULAR | Status: DC | PRN
  Administered 2012-10-14 (×3): 0.5 mg via INTRAVENOUS

## 2012-10-14 MED ORDER — MAGNESIUM HYDROXIDE 400 MG/5ML PO SUSP: 30.0000 mL | Freq: Every day | ORAL | Status: DC | PRN

## 2012-10-14 MED ORDER — INSULIN ASPART 100 UNIT/ML ~~LOC~~ SOLN
10.0000 [IU] | Freq: Once | SUBCUTANEOUS | Status: AC
  Administered 2012-10-14: 10 [IU] via SUBCUTANEOUS

## 2012-10-14 MED ORDER — OXYCODONE HCL 5 MG PO TABS
5.0000 mg | ORAL_TABLET | Freq: Once | ORAL | Status: AC | PRN
Start: 2012-10-14 — End: 2012-10-14
  Administered 2012-10-14: 5 mg via ORAL

## 2012-10-14 MED ORDER — INSULIN ASPART PROT & ASPART (70-30 MIX) 100 UNIT/ML ~~LOC~~ SUSP
45.0000 [IU] | Freq: Two times a day (BID) | SUBCUTANEOUS | Status: DC
  Administered 2012-10-15: 45 [IU] via SUBCUTANEOUS
  Filled 2012-10-14: qty 10

## 2012-10-14 MED ORDER — HYDROMORPHONE HCL PF 1 MG/ML IJ SOLN
INTRAMUSCULAR | Status: AC
  Filled 2012-10-14: qty 2

## 2012-10-14 MED ORDER — INSULIN ASPART 100 UNIT/ML ~~LOC~~ SOLN
17.0000 [IU] | Freq: Once | SUBCUTANEOUS | Status: AC
  Administered 2012-10-14: 17 [IU] via SUBCUTANEOUS

## 2012-10-14 MED ORDER — INSULIN ASPART 100 UNIT/ML ~~LOC~~ SOLN
0.0000 [IU] | SUBCUTANEOUS | Status: DC
  Administered 2012-10-14: 8 [IU] via SUBCUTANEOUS
  Administered 2012-10-14: 3 [IU] via SUBCUTANEOUS

## 2012-10-14 MED ORDER — SODIUM CHLORIDE 0.9 % IR SOLN
Status: DC | PRN
  Administered 2012-10-14: 3000 mL

## 2012-10-14 MED ORDER — AMPICILLIN-SULBACTAM SODIUM 3 (2-1) G IJ SOLR
3.0000 g | Freq: Three times a day (TID) | INTRAMUSCULAR | Status: DC
  Administered 2012-10-15 – 2012-10-17 (×8): 3 g via INTRAVENOUS
  Filled 2012-10-14 (×10): qty 3

## 2012-10-14 MED ORDER — OXYCODONE HCL 5 MG/5ML PO SOLN: 5.0000 mg | Freq: Once | ORAL | Status: AC | PRN

## 2012-10-14 MED ORDER — ONDANSETRON HCL 4 MG/2ML IJ SOLN
INTRAMUSCULAR | Status: DC | PRN
  Administered 2012-10-14: 4 mg via INTRAVENOUS

## 2012-10-14 MED ORDER — OXYCODONE HCL 5 MG PO TABS
ORAL_TABLET | ORAL | Status: AC
  Filled 2012-10-14: qty 1

## 2012-10-14 SURGICAL SUPPLY — 51 items
BANDAGE CONFORM 2  STR LF (GAUZE/BANDAGES/DRESSINGS) IMPLANT
BANDAGE ELASTIC 3 VELCRO ST LF (GAUZE/BANDAGES/DRESSINGS) IMPLANT
BANDAGE ELASTIC 4 VELCRO ST LF (GAUZE/BANDAGES/DRESSINGS) IMPLANT
BANDAGE GAUZE ELAST BULKY 4 IN (GAUZE/BANDAGES/DRESSINGS) IMPLANT
BNDG COHESIVE 1X5 TAN STRL LF (GAUZE/BANDAGES/DRESSINGS) IMPLANT
BNDG ESMARK 4X9 LF (GAUZE/BANDAGES/DRESSINGS) IMPLANT
CLOTH BEACON ORANGE TIMEOUT ST (SAFETY) ×2 IMPLANT
CORDS BIPOLAR (ELECTRODE) ×2 IMPLANT
COVER SURGICAL LIGHT HANDLE (MISCELLANEOUS) ×2 IMPLANT
CUFF TOURNIQUET SINGLE 18IN (TOURNIQUET CUFF) ×2 IMPLANT
CUFF TOURNIQUET SINGLE 24IN (TOURNIQUET CUFF) IMPLANT
DRAIN PENROSE 1/4X12 LTX STRL (WOUND CARE) IMPLANT
DRAPE SURG 17X23 STRL (DRAPES) ×2 IMPLANT
DRSG ADAPTIC 3X8 NADH LF (GAUZE/BANDAGES/DRESSINGS) IMPLANT
DRSG PAD ABDOMINAL 8X10 ST (GAUZE/BANDAGES/DRESSINGS) ×2 IMPLANT
ELECT REM PT RETURN 9FT ADLT (ELECTROSURGICAL) ×2
ELECTRODE REM PT RTRN 9FT ADLT (ELECTROSURGICAL) ×1 IMPLANT
GAUZE XEROFORM 1X8 LF (GAUZE/BANDAGES/DRESSINGS) IMPLANT
GAUZE XEROFORM 5X9 LF (GAUZE/BANDAGES/DRESSINGS) IMPLANT
GLOVE BIOGEL PI IND STRL 8.5 (GLOVE) ×1 IMPLANT
GLOVE BIOGEL PI INDICATOR 8.5 (GLOVE) ×1
GLOVE SURG ORTHO 8.0 STRL STRW (GLOVE) ×2 IMPLANT
GOWN PREVENTION PLUS XLARGE (GOWN DISPOSABLE) ×2 IMPLANT
GOWN STRL NON-REIN LRG LVL3 (GOWN DISPOSABLE) ×2 IMPLANT
HANDPIECE INTERPULSE COAX TIP (DISPOSABLE) ×1
KIT BASIN OR (CUSTOM PROCEDURE TRAY) ×2 IMPLANT
KIT ROOM TURNOVER OR (KITS) ×2 IMPLANT
MANIFOLD NEPTUNE II (INSTRUMENTS) ×2 IMPLANT
NEEDLE HYPO 25GX1X1/2 BEV (NEEDLE) IMPLANT
NS IRRIG 1000ML POUR BTL (IV SOLUTION) ×2 IMPLANT
PACK ORTHO EXTREMITY (CUSTOM PROCEDURE TRAY) ×2 IMPLANT
PAD ARMBOARD 7.5X6 YLW CONV (MISCELLANEOUS) ×4 IMPLANT
PAD CAST 4YDX4 CTTN HI CHSV (CAST SUPPLIES) IMPLANT
PADDING CAST COTTON 4X4 STRL (CAST SUPPLIES)
SET HNDPC FAN SPRY TIP SCT (DISPOSABLE) ×1 IMPLANT
SOAP 2 % CHG 4 OZ (WOUND CARE) ×2 IMPLANT
SPONGE GAUZE 4X4 12PLY (GAUZE/BANDAGES/DRESSINGS) IMPLANT
SPONGE LAP 18X18 X RAY DECT (DISPOSABLE) ×2 IMPLANT
SPONGE LAP 4X18 X RAY DECT (DISPOSABLE) ×2 IMPLANT
SUCTION FRAZIER TIP 10 FR DISP (SUCTIONS) ×2 IMPLANT
SUT ETHILON 4 0 PS 2 18 (SUTURE) IMPLANT
SUT ETHILON 5 0 P 3 18 (SUTURE)
SUT NYLON ETHILON 5-0 P-3 1X18 (SUTURE) IMPLANT
SYR CONTROL 10ML LL (SYRINGE) IMPLANT
TOWEL OR 17X24 6PK STRL BLUE (TOWEL DISPOSABLE) ×2 IMPLANT
TOWEL OR 17X26 10 PK STRL BLUE (TOWEL DISPOSABLE) ×2 IMPLANT
TUBE ANAEROBIC SPECIMEN COL (MISCELLANEOUS) IMPLANT
TUBE CONNECTING 12X1/4 (SUCTIONS) ×2 IMPLANT
UNDERPAD 30X30 INCONTINENT (UNDERPADS AND DIAPERS) ×2 IMPLANT
WATER STERILE IRR 1000ML POUR (IV SOLUTION) ×2 IMPLANT
YANKAUER SUCT BULB TIP NO VENT (SUCTIONS) ×2 IMPLANT

## 2012-10-14 NOTE — Transfer of Care (Signed)
Immediate Anesthesia Transfer of Care Note  [name]  Procedure(s) Performed: Procedure(s): incision and drainage left forearm (Left)  Patient Location: PACU  Anesthesia Type:General  Level of Consciousness: awake  Airway & Oxygen Therapy: Patient Spontanous Breathing and Patient connected to nasal cannula oxygen  Post-op Assessment: Report given to PACU RN and Post -op Vital signs reviewed and stable  Post vital signs: Reviewed and stable  Complications: No apparent anesthesia complications

## 2012-10-14 NOTE — Progress Notes (Signed)
CHART REVIEWED EVENTS NOTED FROM YESTERDAY I CALLED AND SPOKE TO PATIENT YESTERDAY ABOUT RETURNING TO HOSPITAL PT WILL NEED REPEAT I/D OF LEFT ARM TODAY PLAN THIS EVENING FOR REPEAT I/D WILL KEEP NPO FOR SURGERY TODAY ORDERS WILL BE WRITTEN

## 2012-10-14 NOTE — Anesthesia Preprocedure Evaluation (Addendum)
Anesthesia Evaluation  Patient identified by MRN, date of birth, ID band Patient awake    Reviewed: Allergy & Precautions, H&P , NPO status , Patient's Chart, lab work & pertinent test results, reviewed documented beta blocker date and time   Airway Mallampati: II TM Distance: >3 FB Neck ROM: Full    Dental no notable dental hx. (+) Teeth Intact and Dental Advisory Given   Pulmonary Current Smoker,  breath sounds clear to auscultation  Pulmonary exam normal       Cardiovascular hypertension, On Medications Rhythm:Regular Rate:Normal     Neuro/Psych  Headaches, Seizures -, Well Controlled,   Neuromuscular disease negative psych ROS   GI/Hepatic Neg liver ROS, GERD-  Medicated and Controlled,  Endo/Other  diabetes, Type 1, Insulin Dependent  Renal/GU ARFRenal disease  negative genitourinary   Musculoskeletal   Abdominal   Peds  Hematology negative hematology ROS (+)   Anesthesia Other Findings   Reproductive/Obstetrics negative OB ROS                          Anesthesia Physical Anesthesia Plan  ASA: III  Anesthesia Plan: General   Post-op Pain Management:    Induction: Intravenous  Airway Management Planned: LMA  Additional Equipment:   Intra-op Plan:   Post-operative Plan: Extubation in OR  Informed Consent: I have reviewed the patients History and Physical, chart, labs and discussed the procedure including the risks, benefits and alternatives for the proposed anesthesia with the patient or authorized representative who has indicated his/her understanding and acceptance.   Dental advisory given  Plan Discussed with: CRNA, Anesthesiologist and Surgeon  Anesthesia Plan Comments:        Anesthesia Quick Evaluation

## 2012-10-14 NOTE — Anesthesia Postprocedure Evaluation (Signed)
  Anesthesia Post-op Note  [name]  Procedure(s) Performed: Procedure(s): incision and drainage left forearm (Left)  Patient Location: PACU  Anesthesia Type:General  Level of Consciousness: awake, alert  and oriented  Airway and Oxygen Therapy: Patient Spontanous Breathing and Patient connected to nasal cannula oxygen  Post-op Pain: mild  Post-op Assessment: Post-op Vital signs reviewed, Patient's Cardiovascular Status Stable, Respiratory Function Stable, Patent Airway, No signs of Nausea or vomiting and Pain level controlled  Post-op Vital Signs: stable  Complications: No apparent anesthesia complications

## 2012-10-14 NOTE — ED Provider Notes (Signed)
CSN: [id number]     Arrival date & time 10/13/12  1911 History     First MD Initiated Contact with Patient 10/13/12 2314     Chief Complaint  Patient presents with  . Post-op Problem   (Consider location/radiation/quality/duration/timing/severity/associated sxs/prior Treatment) HPI History provided by patient. Was admitted 3 days ago for abscess of his left arm that had been present for 2 weeks, presented with fever and significant swelling and leukocytosis. He is admitted to try a hospitalist service in consultation by hand - he had 2 surgeries for the last 2 days. Today at noon he states he got upset and left AMA.  He was called by a surgeon who explained the seriousness of his situation. He returns to the hospital tonight requesting to be admitted for planned OR in the a.m. he continues to have fevers. He has training wounds with persistent severe pain. No vomiting. No syncope.  Past Medical History  Diagnosis Date  . Diabetes mellitus without complication   . Anxiety   . Pneumonia   . GERD (gastroesophageal reflux disease)   . Seizures   . [id number])    Past Surgical History  Procedure Laterality Date  . Irrigation and debridement abscess      Hx: of left arm   Family History  Problem Relation Age of Onset  . Diabetes Father   . Heart disease Father   . Mental illness Sister    History  Substance Use Topics  . Smoking status: Current Every Day Smoker -- 1.00 packs/day    Types: Cigarettes  . Smokeless tobacco: Never Used  . Alcohol Use: Yes     Comment: occasional    Review of Systems  Constitutional: Positive for fever and chills.  HENT: Negative for neck pain and neck stiffness.   Eyes: Negative for visual disturbance.  Respiratory: Negative for shortness of breath.   Cardiovascular: Negative for chest pain.  Gastrointestinal: Negative for abdominal pain.  Genitourinary: Negative for dysuria.  Musculoskeletal: Negative for back pain.  Skin: Positive for  wound.  Neurological: Negative for headaches.  All other systems reviewed and are negative.    Allergies  Review of patient's allergies indicates no known allergies.  Home Medications   Current Outpatient Rx  Name  Route  Sig  Dispense  Refill  . alprazolam (XANAX) 2 MG tablet   Oral   Take 1-2 mg by mouth 2 (two) times daily as needed for sleep or anxiety.         Marland Kitchen esomeprazole (NEXIUM) 40 MG capsule   Oral   Take 40 mg by mouth daily before breakfast.         . ibuprofen (ADVIL,MOTRIN) 200 MG tablet   Oral   Take 800 mg by mouth daily as needed for pain.         Marland Kitchen insulin aspart (NOVOLOG) 100 UNIT/ML injection   Subcutaneous   Inject 4-16 Units into the skin 3 (three) times daily with meals. *per sliding scale         . insulin aspart protamine- aspart (NOVOLOG MIX 70/30) (70-30) 100 UNIT/ML injection   Subcutaneous   Inject 45-55 Units into the skin 2 (two) times daily with a meal. Uses 55 units in the morning and 45 units at night         . lisinopril (PRINIVIL,ZESTRIL) 10 MG tablet   Oral   Take 10 mg by mouth daily.         Marland Kitchen oxymorphone (OPANA ER) 20  MG 12 hr tablet   Oral   Take 20 mg by mouth every 12 (twelve) hours.          BP 111/65  Pulse 98  Temp(Src) 98.5 F (36.9 C) (Oral)  Resp 18  SpO2 98% Physical Exam  Constitutional: He is oriented to person, place, and time. He appears well-developed and well-nourished.  HENT:  Head: Normocephalic and atraumatic.  Dry mucous membranes  Eyes: EOM are normal. Pupils are equal, round, and reactive to light.  Neck: Neck supple.  Cardiovascular: Regular rhythm and intact distal pulses.   Tachycardic  Pulmonary/Chest: Effort normal and breath sounds normal. No respiratory distress.  Abdominal: Soft. There is no tenderness.  Musculoskeletal:  Left upper extremity with bandage draining wounds and significant swelling throughout. Decreased range of motion fingertips with cap refill intact.   Neurological: He is alert and oriented to person, place, and time.  Skin: Skin is warm and dry.    ED Course   Procedures (including critical care time)  Labs Reviewed  CBC   US Renal  10/13/2012   *RADIOLOGY REPORT*  Clinical Data: Acute renal insufficiency.  RENAL/URINARY TRACT ULTRASOUND COMPLETE  Comparison:  No priors.  Findings:  Right Kidney:  No hydronephrosis.  Well-preserved cortex.  Normal size and parenchymal echotexture without focal abnormalities.  10.9 cm in length.  Left Kidney:  No hydronephrosis.  Well-preserved cortex.  Normal size and parenchymal echotexture without focal abnormalities.  11.7 cm in length.  Bladder:  Well distended without focal neural abnormalities.  IMPRESSION: 1.  Normal sonographic appearance of the kidneys and urinary bladder.   Original Report Authenticated By: Vinnie Langton, M.D.   1. Abscess   2. Renal failure, acute   3. Leukocytosis    IV antibiotics. IV fluids Repeat labs 12:30 AM discuss with hospitalist on call, Dr. Posey Pronto - plan admit  MDM  Left upper extremity abscess in diabetic with history of IV drug abuse  Readmitted to medicine after left AMA  Records review as above    Teressa Lower, MD 10/14/12 0031

## 2012-10-14 NOTE — Preoperative (Signed)
Beta Blockers   Reason not to administer Beta Blockers:Not Applicable 

## 2012-10-14 NOTE — Progress Notes (Signed)
Patient admitted after midnight. Chart reviewed. Patient is currently in the OR. Did not examine.

## 2012-10-14 NOTE — Progress Notes (Signed)
ANTIBIOTIC CONSULT NOTE - INITIAL  Pharmacy Consult for Ampicillin/Sulbactam Indication: Fever/Leukocytosis/Left arm abscess  No Known Allergies  Patient Measurements: Height: 5\' 9"  (175.3 cm)  Weight: 190 lb (86.183 kg)  IBW/kg (Calculated) : 70.7  Vital Signs: Temp: 99 F (37.2 C) (07/29 0446) Temp src: Oral (07/28 2117) BP: 105/63 mmHg (07/29 0446) Pulse Rate: 103 (07/29 0446)  Labs:  Recent Labs  10/12/12 0951 10/13/12 0815 10/14/12 0027 10/14/12 0032  WBC 22.2* 21.9* 18.5*  --   HGB 10.1* 8.2* 8.6* 8.8*  PLT 338 354 415*  --   CREATININE 1.71* 2.64*  --  2.60*   Microbiology: Recent Results (from the past 720 hour(s))  CULTURE, BLOOD (ROUTINE X 2)     Status: None   Collection Time    10/11/12  3:00 AM      Result Value Range Status   Specimen Description BLOOD RIGHT WRIST   Final   Special Requests BOTTLES DRAWN AEROBIC ONLY 10CC   Final   Culture  Setup Time 10/11/2012 18:12   Final   Culture     Final   Value:        BLOOD CULTURE RECEIVED NO GROWTH TO DATE CULTURE WILL BE HELD FOR 5 DAYS BEFORE ISSUING A FINAL NEGATIVE REPORT   Report Status PENDING   Incomplete  CULTURE, BLOOD (ROUTINE X 2)     Status: None   Collection Time    10/11/12  3:15 AM      Result Value Range Status   Specimen Description BLOOD RIGHT ARM   Final   Special Requests BOTTLES DRAWN AEROBIC ONLY 6CC    Final   Culture  Setup Time 10/11/2012 18:13   Final   Culture     Final   Value:        BLOOD CULTURE RECEIVED NO GROWTH TO DATE CULTURE WILL BE HELD FOR 5 DAYS BEFORE ISSUING A FINAL NEGATIVE REPORT   Report Status PENDING   Incomplete  WOUND CULTURE     Status: None   Collection Time    10/11/12 10:32 AM      Result Value Range Status   Specimen Description WOUND LEFT ARM   Final   Special Requests SUPERFICIAL   Final   Gram Stain     Final   Value: RARE WBC PRESENT,BOTH PMN AND MONONUCLEAR     NO SQUAMOUS EPITHELIAL CELLS SEEN     FEW GRAM POSITIVE COCCI     IN PAIRS IN  CLUSTERS   Culture     Final   Value: MULTIPLE ORGANISMS PRESENT, NONE PREDOMINANT     Note: NO STAPHYLOCOCCUS AUREUS ISOLATED NO GROUP A STREP (S.PYOGENES) ISOLATED   Report Status PENDING   Incomplete  AFB CULTURE WITH SMEAR     Status: None   Collection Time    10/11/12 10:32 AM      Result Value Range Status   Specimen Description WOUND LEFT ARM   Final   Special Requests SUPERFICIAL   Final   ACID FAST SMEAR NO ACID FAST BACILLI SEEN   Final   Culture     Final   Value: CULTURE WILL BE EXAMINED FOR 6 WEEKS BEFORE ISSUING A FINAL REPORT   Report Status PENDING   Incomplete  ANAEROBIC CULTURE     Status: None   Collection Time    10/11/12 10:32 AM      Result Value Range Status   Specimen Description WOUND LEFT ARM   Final  Special Requests SUPERFICIAL   Final   Gram Stain PENDING   Incomplete   Culture     Final   Value: NO ANAEROBES ISOLATED; CULTURE IN PROGRESS FOR 5 DAYS   Report Status PENDING   Incomplete  WOUND CULTURE     Status: None   Collection Time    10/11/12 10:32 AM      Result Value Range Status   Specimen Description WOUND LEFT ARM   Final   Special Requests DEEP   Final   Gram Stain     Final   Value: RARE WBC PRESENT,BOTH PMN AND MONONUCLEAR     RARE SQUAMOUS EPITHELIAL CELLS PRESENT     FEW GRAM POSITIVE COCCI     IN PAIRS IN CLUSTERS FEW GRAM NEGATIVE RODS   Culture     Final   Value: MULTIPLE ORGANISMS PRESENT, NONE PREDOMINANT     Note: NO STAPHYLOCOCCUS AUREUS ISOLATED NO GROUP A STREP (S.PYOGENES) ISOLATED   Report Status 10/13/2012 FINAL   Final  AFB CULTURE WITH SMEAR     Status: None   Collection Time    10/11/12 10:32 AM      Result Value Range Status   Specimen Description WOUND LEFT ARM   Final   Special Requests DEEP   Final   ACID FAST SMEAR NO ACID FAST BACILLI SEEN   Final   Culture     Final   Value: CULTURE WILL BE EXAMINED FOR 6 WEEKS BEFORE ISSUING A FINAL REPORT   Report Status PENDING   Incomplete  ANAEROBIC CULTURE      Status: None   Collection Time    10/11/12 10:32 AM      Result Value Range Status   Specimen Description WOUND LEFT ARM   Final   Special Requests DEEP   Final   Gram Stain PENDING   Incomplete   Culture     Final   Value: NO ANAEROBES ISOLATED; CULTURE IN PROGRESS FOR 5 DAYS   Report Status PENDING   Incomplete    Medical History: Past Medical History  Diagnosis Date  . Diabetes mellitus without complication   . Anxiety   . Pneumonia   . GERD (gastroesophageal reflux disease)   . Seizures   . Headache(784.0)    Medications:  Anti-infectives   Start     Dose/Rate Route Frequency Ordered Stop   10/14/12 0015  ciprofloxacin (CIPRO) IVPB 400 mg     400 mg 200 mL/hr over 60 Minutes Intravenous  Once 10/14/12 0008 10/14/12 0147   10/14/12 0015  vancomycin (VANCOCIN) IVPB 1000 mg/200 mL premix     1,000 mg 200 mL/hr over 60 Minutes Intravenous  Once 10/14/12 0008 10/14/12 0250     Assessment: 32 yo male with an arm abscess and is s/p I&D as well as leaving AMA.  He is readmitted now for further treatment and for OR today.  We have been asked to assist with his IV antibiotics.  His creatinine is 2.6 with an estimated clearance of ~ 19ml/min.  Goal of Therapy:  Therapeutic response to IV antibiotics  Plan:  1.  Will begin IV Ampicillin/Sulbactam at 1.5 gm IV every 8 hours. 2.  Will monitor creatinine, adjust dose accordingly. 3.  F/u culture data and de-escalate antibiotics as needed.  Rober Minion, PharmD., MS Clinical Pharmacist Pager:  [phone] Thank you for allowing pharmacy to be part of this patients care team. 10/14/2012,6:45 AM

## 2012-10-14 NOTE — H&P (Signed)
[facility] History and Physical  AUGIE MCMANAMA  [id number]  DOB: 06-Oct-1980  DOA: 10/13/2012  Referring physician: Teressa Lower PCP: Default, Provider, MD  Specialists: orthopedic  Chief Complaint: cellulitis  HPI: Luke [name] is a 32 y.o. male with Past medical history of IVDA and DM presented today with c/o fever and worsening of pain and swelling at the infection site on the left arm. He has been recently admitted for same and has been treated with repeated wound debridement and has bene on vancomycin and cipro. He also has been being investigated for AKI. Today morning he left AMA and since his surgeon called him to return he is back for the treatment. He mentions that after leaving hospital other then smoking his cigarettes he has doe nothing else. Pt denies any  headache, nausea, vomiting, abdominal pain, diarrhea, constipation, active bleeding, chest pain, palpitation, dizziness, pedal edema, orthopnea, PND, focal neurological deficit, burning urination, cough, shortness of breath.   Review of Systems: as mentioned in the history of present illness.  A Comprehensive review of the other systems is negative.  Past Medical History  Diagnosis Date  . Diabetes mellitus without complication   . Anxiety   . Pneumonia   . GERD (gastroesophageal reflux disease)   . Seizures   . [id number])    Past Surgical History  Procedure Laterality Date  . Irrigation and debridement abscess      Hx: of left arm   Social History:  reports that he has been smoking Cigarettes.  He has been smoking about 1.00 pack per day. He has never used smokeless tobacco. He reports that  drinks alcohol. He reports that he uses illicit drugs (Cocaine). Patient is coming from home. Patient can participate in ADLs.  No Known Allergies  Family History  Problem Relation Age of Onset  . Diabetes Father   . Heart disease Father   . Mental illness Sister     Prior to Admission medications    Medication Sig Start Date End Date Taking? Authorizing Provider  alprazolam Duanne Moron) 2 MG tablet Take 1-2 mg by mouth 2 (two) times daily as needed for sleep or anxiety.    Historical Provider, MD  esomeprazole (NEXIUM) 40 MG capsule Take 40 mg by mouth daily before breakfast.    Historical Provider, MD  ibuprofen (ADVIL,MOTRIN) 200 MG tablet Take 800 mg by mouth daily as needed for pain.    Historical Provider, MD  insulin aspart (NOVOLOG) 100 UNIT/ML injection Inject 4-16 Units into the skin 3 (three) times daily with meals. *per sliding scale    Historical Provider, MD  insulin aspart protamine- aspart (NOVOLOG MIX 70/30) (70-30) 100 UNIT/ML injection Inject 45-55 Units into the skin 2 (two) times daily with a meal. Uses 55 units in the morning and 45 units at night    Historical Provider, MD  lisinopril (PRINIVIL,ZESTRIL) 10 MG tablet Take 10 mg by mouth daily.    Historical Provider, MD  oxymorphone (OPANA ER) 20 MG 12 hr tablet Take 20 mg by mouth every 12 (twelve) hours.    Historical Provider, MD    Physical Exam: Filed Vitals:   10/13/12 1919 10/13/12 2117 10/14/12 0120  BP: 122/60 111/65 114/61  Pulse: 98  118  Temp: 97.9 F (36.6 C) 98.5 F (36.9 C) 98.8 F (37.1 C)  TempSrc: Oral Oral   Resp: 18 18 18   SpO2: 99% 98% 95%    General: Alert, Awake and Oriented to Time, Place and Person. Appear in  severe distress Eyes: PERRL ENT: Oral Mucosa clear dry. Neck: no JVD, no Carotid Bruits, no Stiffness Cardiovascular: S1 and S2 Present, Murmur no, Peripheral Pulses Present Respiratory: Clear to Auscultation, Bilateral Air entry equal and Decreased Abdomen: Bowel Sound Present, Soft and Non tender,no Organomegaly Skin: wrapped in dressing left upper extr Extremities: Pedal edema no, calf tenderness no. Neurologic: Mental status, Motor strength, Sensation, reflexes, Proprioception Grossly Unremarkable.  Labs on Admission:  Basic Metabolic Panel:  Recent Labs Lab  10/11/12 0057 10/12/12 0951 10/12/12 1341 10/13/12 0815 10/14/12 0032  NA 131* 130*  --  133* 137  K 3.9 3.7  --  4.0 3.7  CL 97 95*  --  99 103  CO2  --  22  --  24  --   GLUCOSE 393* 309* 216* 483*  481* 300*  BUN 15 15  --  18 16  CREATININE 1.60* 1.71*  --  2.64* 2.60*  CALCIUM  --  8.6  --  7.9*  --    Liver Function Tests: No results found for this basename: AST, ALT, ALKPHOS, BILITOT, PROT, ALBUMIN,  in the last 168 hours No results found for this basename: LIPASE, AMYLASE,  in the last 168 hours No results found for this basename: AMMONIA,  in the last 168 hours CBC:  Recent Labs Lab 10/11/12 0012 10/11/12 0057 10/12/12 0951 10/13/12 0815 10/14/12 0027 10/14/12 0032  WBC 30.7*  --  22.2* 21.9* 18.5*  --   NEUTROABS 25.4*  --   --   --   --   --   HGB 13.1 13.9 10.1* 8.2* 8.6* 8.8*  HCT 35.9* 41.0 29.6* 24.3* 25.5* 26.0*  MCV 82.7  --  83.4 84.1 83.6  --   PLT 469*  --  338 354 415*  --    Cardiac Enzymes: No results found for this basename: CKTOTAL, CKMB, CKMBINDEX, TROPONINI,  in the last 168 hours  BNP (last 3 results) No results found for this basename: PROBNP,  in the last 8760 hours CBG:  Recent Labs Lab 10/12/12 0815 10/12/12 1557 10/12/12 2005 10/13/12 0420 10/13/12 0736  GLUCAP 322* 198* 181* 340* 415*    Radiological Exams on Admission: US Renal  10/13/2012   *RADIOLOGY REPORT*  Clinical Data: Acute renal insufficiency.  RENAL/URINARY TRACT ULTRASOUND COMPLETE  Comparison:  No priors.  Findings:  Right Kidney:  No hydronephrosis.  Well-preserved cortex.  Normal size and parenchymal echotexture without focal abnormalities.  10.9 cm in length.  Left Kidney:  No hydronephrosis.  Well-preserved cortex.  Normal size and parenchymal echotexture without focal abnormalities.  11.7 cm in length.  Bladder:  Well distended without focal neural abnormalities.  IMPRESSION: 1.  Normal sonographic appearance of the kidneys and urinary bladder.   Original  Report Authenticated By: Vinnie Langton, M.D.    Assessment/Plan Principal Problem:   Abscess Active Problems:   Drug abuse, IV   DM (dermatomyositis)   HTN (hypertension)   Renal failure, acute   Leukocytosis   1. Cellulitis with abscess Continue ABx as per prior culture per ID recommendation Iv fluids Consulting orthopedics again. May also need ID recommendation Tylenol and dilaudid for pain control   2. AKI Less likely obstruction. Possible ATN due to sepsis and due to vancomycin. Pt already got dose of vancomycin in ER> Further dosing based on renal function and trough values.  3. Smoking. nicotine patch and councell given.  4.DM Sugars uncontrolled. On sliding scale of insulin  DVT Prophylaxis: Heparin Nutrition: npo  for procedure  Code Status: full    Author: Berle Mull, MD [facility] Pager: [phone] 10/14/2012 2:09 AM    If 7PM-7AM, please contact night-coverage [email or web address] Password [email or web address]

## 2012-10-14 NOTE — Brief Op Note (Signed)
10/11/2012 - 10/12/2012  8:09 PM  PATIENT:  Luke [name]  32 y.o. male  PRE-OPERATIVE DIAGNOSIS:   Left forearm abscess  POST-OPERATIVE DIAGNOSIS:  left forearm abscess  PROCEDURE:  Procedure(s): IRRIGATION AND DEBRIDEMENT FOREARM (Left)  SURGEON:  Surgeon(s) and Role:    * Linna Hoff, MD - Primary  PHYSICIAN ASSISTANT:   ASSISTANTS: none   ANESTHESIA:   general  EBL:  Total I/O In: 1020 [P.O.:120; I.V.:900] Out: 30 [Blood:30]  BLOOD ADMINISTERED:none  DRAINS: Penrose x 2    LOCAL MEDICATIONS USED:  none  SPECIMEN:  No Specimen  DISPOSITION OF SPECIMEN:  N/A  COUNTS:  YES  TOURNIQUET:    DICTATION: [id number]  PLAN OF CARE: Admit to inpatient   PATIENT DISPOSITION:  PACU - hemodynamically stable.   Delay start of Pharmacological VTE agent (>24hrs) due to surgical blood loss or risk of bleeding: not applicable

## 2012-10-14 NOTE — Anesthesia Procedure Notes (Signed)
Procedure Name: LMA Insertion Date/Time: 10/14/2012 6:38 PM Performed by: Maude Leriche DOBSON Pre-anesthesia Checklist: Patient identified, Emergency Drugs available, Suction available, Patient being monitored and Timeout performed Patient Re-evaluated:Patient Re-evaluated prior to inductionOxygen Delivery Method: Circle system utilized Preoxygenation: Pre-oxygenation with 100% oxygen Intubation Type: IV induction Ventilation: Mask ventilation without difficulty LMA: LMA inserted LMA Size: 5.0 Number of attempts: 1 Placement Confirmation: positive ETCO2 and breath sounds checked- equal and bilateral Tube secured with: Tape Dental Injury: Teeth and Oropharynx as per pre-operative assessment

## 2012-10-14 NOTE — Consult Note (Signed)
WOC consult Note Reason for Consult: Consult requested for cellulitis prior to ortho team involvement.  Dr Apolonio Schneiders now following for assessment and plan of care.  Progress notes indicate pt will go to the OR today for a repeat I&D of left forearm abscess.  Please refer to the ortho team for any further questions and re-consult if further assistance is needed.  Thank-you,  Julien Girt MSN, RN, CWOCN, CWCN-AP, CNS

## 2012-10-15 LAB — WOUND CULTURE

## 2012-10-15 LAB — BASIC METABOLIC PANEL
BUN: 12 mg/dL (ref 6–23)
Chloride: 98 mEq/L (ref 96–112)
Creatinine, Ser: 2.21 mg/dL — ABNORMAL HIGH (ref 0.50–1.35)
GFR calc non Af Amer: 38 mL/min — ABNORMAL LOW (ref >90)
Glucose, Bld: 454 mg/dL — ABNORMAL HIGH (ref 70–99)
Potassium: 3.8 mEq/L (ref 3.5–5.1)

## 2012-10-15 LAB — GLUCOSE, CAPILLARY
Glucose-Capillary: 330 mg/dL — ABNORMAL HIGH (ref 70–99)
Glucose-Capillary: 510 mg/dL — ABNORMAL HIGH (ref 70–99)
Glucose-Capillary: 76 mg/dL (ref 70–99)

## 2012-10-15 MED ORDER — HYDROMORPHONE HCL PF 1 MG/ML IJ SOLN
1.0000 mg | INTRAMUSCULAR | Status: AC | PRN
  Administered 2012-10-15 (×4): 1 mg via INTRAVENOUS
  Filled 2012-10-15 (×4): qty 1

## 2012-10-15 MED ORDER — OXYCODONE HCL 5 MG PO TABS
10.0000 mg | ORAL_TABLET | Freq: Once | ORAL | Status: AC
Start: 2012-10-15 — End: 2012-10-15
  Administered 2012-10-15: 10 mg via ORAL
  Filled 2012-10-15: qty 2

## 2012-10-15 MED ORDER — MORPHINE SULFATE 2 MG/ML IJ SOLN
2.0000 mg | INTRAMUSCULAR | Status: DC | PRN
  Administered 2012-10-15 – 2012-10-16 (×8): 2 mg via INTRAVENOUS
  Filled 2012-10-15 (×9): qty 1

## 2012-10-15 MED ORDER — INSULIN ASPART 100 UNIT/ML ~~LOC~~ SOLN: 0.0000 [IU] | SUBCUTANEOUS | Status: DC

## 2012-10-15 MED ORDER — INSULIN ASPART 100 UNIT/ML ~~LOC~~ SOLN
0.0000 [IU] | Freq: Three times a day (TID) | SUBCUTANEOUS | Status: DC
  Administered 2012-10-15: 3 [IU] via SUBCUTANEOUS

## 2012-10-15 MED ORDER — INSULIN ASPART 100 UNIT/ML ~~LOC~~ SOLN: 0.0000 [IU] | Freq: Every day | SUBCUTANEOUS | Status: DC

## 2012-10-15 MED ORDER — INSULIN ASPART PROT & ASPART (70-30 MIX) 100 UNIT/ML ~~LOC~~ SUSP
45.0000 [IU] | Freq: Two times a day (BID) | SUBCUTANEOUS | Status: DC
  Administered 2012-10-15 – 2012-10-16 (×2): 45 [IU] via SUBCUTANEOUS

## 2012-10-15 MED ORDER — INSULIN ASPART 100 UNIT/ML ~~LOC~~ SOLN
0.0000 [IU] | SUBCUTANEOUS | Status: DC
  Administered 2012-10-15: 20 [IU] via SUBCUTANEOUS

## 2012-10-15 NOTE — Op Note (Signed)
NAMEMENELIK, KOSIK NO.:  [id number]  MEDICAL RECORD NO.:  [id number]  LOCATION:  PERIO                        FACILITY:  [place]  PHYSICIAN:  Melrose Nakayama, MD  DATE OF BIRTH:  1980-08-18  DATE OF PROCEDURE:  10/14/2012 DATE OF DISCHARGE:                              OPERATIVE REPORT   PREOPERATIVE DIAGNOSES: 1. Left forearm abscess, volar and ulnar. 2. Left forearm abscess, dorsal and central extensor musculature. 3. Intravenous drug use.  POSTOPERATIVE DIAGNOSES: 1. Left forearm abscess, volar and ulnar. 2. Left forearm abscess, dorsal and central extensor musculature. 3. Intravenous drug use.  ATTENDING PHYSICIAN:  Melrose Nakayama, MD who scrubbed and present for the entire procedure.  ASSISTANT SURGEON:  None.  SURGICAL PROCEDURE: 1. Incision and drainage of left forearm deep abscess, volar and     ulnar. 2. Left forearm incision and drainage, deep abscess through a separate     incision. 3. Left forearm decompressive fasciotomy and debridement of nonviable     muscle, excisional debridement of the flexor surface.  SURGICAL INDICATIONS:  Luke [name] is a 32 year old right-hand-dominant gentleman, who unfortunately relapsed injecting cocaine in his left arm. The patient presented to ER with a severe infection in the upper extremity.  The patient taken to the operating room on the day of presentation, the day following that today for repeat irrigation and debridement.  DESCRIPTION OF PROCEDURE:  The patient was properly identified in the preoperative holding area and marked with a permanent marker made on the left arm to indicate correct operative site.  The patient was then brought to the operating room, placed supine on anesthesia room table. General anesthesia was administered.  The patient was receiving IV antibiotics and Unasyn and ciprofloxacin prior to the anesthesia.  A well-padded tourniquet was then placed on the left brachium,  sealed with 1000 drape.  The left upper extremity was then prepped and draped in the normal sterile fashion.  Time-out was called, correct side was identified, and procedure then begun.  An incision was then lengthened, both proximally and distally because the patient still had purulent discharge coming from the entire incision.  It looked better today.  The dorsal and volar area was then opened up.  The abscess area was then drained both proximally and distally.  The patient did have left nonviable muscle in the flexor compartment.  Excisional debridement was then carried out with nice rongeurs and sharp scissors removing the devitalized muscles of the FDS to the ring finger and portion of the flexor carpi radialis and pronator and palmaris longus.  The patient did have an excisional debridement was then carried out of the FCR and median nerve innervated structures.  The median nerve was then carefully protected throughout.  The infection tracked all the way down into the carpal canal.  The carpal canal was then opened up proximally and careful protection of the median nerve was then carried out.  The debridement was carried out of the FDS and the FDP distally.  Pulsatile lavage of 6 L was then run through the arm.  Copious irrigation done throughout after extensor sharp debridement.  Following this, the wound  was then closed over Penrose drains.  Adaptic dressing, sterile compressive bandage was then applied.  The patient tolerated the procedure well, returned to recovery room in good condition.  POSTPROCEDURE PLAN:  The patient has a very severe infection in the upper extremity, again will continue to stay very aggressive in terms taking him back and forth to the operating room, likely repeat I and D in 48 hours.  Examination required multiple operations in the coming week.  Again, I will try to rid him of the terrible infection that he has of his arm.  Continue the IV therapy and  inpatient care.     Melrose Nakayama, MD     FWO/MEDQ  D:  10/14/2012  T:  10/15/2012  Job:  [id number]

## 2012-10-15 NOTE — Progress Notes (Signed)
[facility] PROGRESS NOTE  JAPHET GUIDEN [id number] DOB: 07-31-1980 DOA: 10/13/2012 PCP: Default, Provider, MD  Assessment/Plan: Principal Problem:   Abscess: Status post treatment. Continue IV antibiotics. 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 and every 4 hours aggressive sliding scale   HTN (hypertension): Elevated blood pressures, secondary to pain an& withdrawal  Chronic kidney disease stage IV-looks to be at baseline.    Leukocytosis-improving with debridement. Continue IV antibiotics   Code Status: Full  Family Communication: No family present. We'll leave message.  Disposition Plan: Home eventually    Consultants:  Dr. Carollee Leitz surgery  Procedures:  Status post irrigation and debridement of left forearm on 7/29  Antibiotics:  IV Unasyn day 2  HPI/Subjective:  patient complains of severe left upper extremity pain   Objective: Filed Vitals:   10/14/12 2257 10/15/12 0209 10/15/12 0535 10/15/12 1412  BP: 168/88 152/78 152/76 149/84  Pulse: 115 126 110 111  Temp: 100.1 F (37.8 C) 100.3 F (37.9 C) 99.6 F (37.6 C) 99.3 F (37.4 C)  TempSrc:      Resp: 18 18 18 20   Height:      Weight:      SpO2: 98% 93% 95% 98%    Intake/Output Summary (Last 24 hours) at 10/15/12 1618 Last data filed at 10/15/12 0700  Gross per 24 hour  Intake   3385 ml  Output   1325 ml  Net   2060 ml   Filed Weights   10/14/12 0900  Weight: 86.2 kg (190 lb 0.6 oz)    Exam:   General:  Moderate distress secondary to pain, alert and oriented x3  Cardiovascular: Regular rate and rhythm, S1-S2  Respiratory: Clear to auscultation bilaterally  Abdomen: Soft, NT, ND +BS  Musculoskeletal: Left UE wrapped, tender  Data Reviewed: Basic Metabolic Panel:  Recent Labs Lab 10/11/12 0057 10/12/12 0951  10/12/12 1341 10/13/12 0815 10/14/12 0032 10/14/12 0820 10/15/12 1140  NA 131* 130*  --  133* 137 137 133*  K 3.9 3.7  --  4.0 3.7 3.4* 3.8  CL 97 95*  --  99 103 103 98  CO2  --  22  --  24  --  24 24  GLUCOSE 393* 309* 216* 483*  481* 300* 245* 454*  BUN 15 15  --  18 16 16 12   CREATININE 1.60* 1.71*  --  2.64* 2.60* 2.47* 2.21*  CALCIUM  --  8.6  --  7.9*  --  8.5 8.7   Liver Function Tests:  Recent Labs Lab 10/14/12 0820  AST 12  ALT 8  ALKPHOS 136*  BILITOT 0.2*  PROT 5.4*  ALBUMIN 2.0*   CBC:  Recent Labs Lab 10/11/12 0012  10/12/12 0951 10/13/12 0815 10/14/12 0027 10/14/12 0032 10/14/12 0820  WBC 30.7*  --  22.2* 21.9* 18.5*  --  16.4*  NEUTROABS 25.4*  --   --   --   --   --  10.8*  HGB 13.1  < > 10.1* 8.2* 8.6* 8.8* 8.0*  HCT 35.9*  < > 29.6* 24.3* 25.5* 26.0* 22.4*  MCV 82.7  --  83.4 84.1 83.6  --  82.7  PLT 469*  --  338 354 415*  --  438*  < > = values in this interval not displayed. CBG:  Recent Labs Lab 10/14/12 2006 10/14/12  2218 10/15/12 0704 10/15/12 0706 10/15/12 1053  GLUCAP 211* 330* >600* 588* 510*    Recent Results (from the past 240 hour(s))  CULTURE, BLOOD (ROUTINE X 2)     Status: None   Collection Time    10/11/12  3:00 AM      Result Value Range Status   Specimen Description BLOOD RIGHT WRIST   Final   Special Requests BOTTLES DRAWN AEROBIC ONLY 10CC   Final   Culture  Setup Time 10/11/2012 18:12   Final   Culture     Final   Value:        BLOOD CULTURE RECEIVED NO GROWTH TO DATE CULTURE WILL BE HELD FOR 5 DAYS BEFORE ISSUING A FINAL NEGATIVE REPORT   Report Status PENDING   Incomplete  CULTURE, BLOOD (ROUTINE X 2)     Status: None   Collection Time    10/11/12  3:15 AM      Result Value Range Status   Specimen Description BLOOD RIGHT ARM   Final   Special Requests BOTTLES DRAWN AEROBIC ONLY 6CC    Final   Culture  Setup Time 10/11/2012 18:13   Final   Culture     Final   Value:        BLOOD CULTURE RECEIVED NO  GROWTH TO DATE CULTURE WILL BE HELD FOR 5 DAYS BEFORE ISSUING A FINAL NEGATIVE REPORT   Report Status PENDING   Incomplete  WOUND CULTURE     Status: None   Collection Time    10/11/12 10:32 AM      Result Value Range Status   Specimen Description WOUND LEFT ARM   Final   Special Requests SUPERFICIAL   Final   Gram Stain     Final   Value: RARE WBC PRESENT,BOTH PMN AND MONONUCLEAR     NO SQUAMOUS EPITHELIAL CELLS SEEN     FEW GRAM POSITIVE COCCI     IN PAIRS IN CLUSTERS   Culture     Final   Value: FEW STAPHYLOCOCCUS SPECIES (COAGULASE NEGATIVE)     Note: RIFAMPIN AND GENTAMICIN SHOULD NOT BE USED AS SINGLE DRUGS FOR TREATMENT OF STAPH INFECTIONS.   Report Status 10/15/2012 FINAL   Final   Organism ID, Bacteria STAPHYLOCOCCUS SPECIES (COAGULASE NEGATIVE)   Final  AFB CULTURE WITH SMEAR     Status: None   Collection Time    10/11/12 10:32 AM      Result Value Range Status   Specimen Description WOUND LEFT ARM   Final   Special Requests SUPERFICIAL   Final   ACID FAST SMEAR NO ACID FAST BACILLI SEEN   Final   Culture     Final   Value: CULTURE WILL BE EXAMINED FOR 6 WEEKS BEFORE ISSUING A FINAL REPORT   Report Status PENDING   Incomplete  ANAEROBIC CULTURE     Status: None   Collection Time    10/11/12 10:32 AM      Result Value Range Status   Specimen Description WOUND LEFT ARM   Final   Special Requests SUPERFICIAL   Final   Gram Stain PENDING   Incomplete   Culture     Final   Value: NO ANAEROBES ISOLATED; CULTURE IN PROGRESS FOR 5 DAYS   Report Status PENDING   Incomplete  WOUND CULTURE     Status: None   Collection Time    10/11/12 10:32 AM      Result Value Range Status   Specimen Description  WOUND LEFT ARM   Final   Special Requests DEEP   Final   Gram Stain     Final   Value: RARE WBC PRESENT,BOTH PMN AND MONONUCLEAR     RARE SQUAMOUS EPITHELIAL CELLS PRESENT     FEW GRAM POSITIVE COCCI     IN PAIRS IN CLUSTERS FEW GRAM NEGATIVE RODS   Culture     Final    Value: MULTIPLE ORGANISMS PRESENT, NONE PREDOMINANT     Note: NO STAPHYLOCOCCUS AUREUS ISOLATED NO GROUP A STREP (S.PYOGENES) ISOLATED   Report Status 10/13/2012 FINAL   Final  AFB CULTURE WITH SMEAR     Status: None   Collection Time    10/11/12 10:32 AM      Result Value Range Status   Specimen Description WOUND LEFT ARM   Final   Special Requests DEEP   Final   ACID FAST SMEAR NO ACID FAST BACILLI SEEN   Final   Culture     Final   Value: CULTURE WILL BE EXAMINED FOR 6 WEEKS BEFORE ISSUING A FINAL REPORT   Report Status PENDING   Incomplete  ANAEROBIC CULTURE     Status: None   Collection Time    10/11/12 10:32 AM      Result Value Range Status   Specimen Description WOUND LEFT ARM   Final   Special Requests DEEP   Final   Gram Stain PENDING   Incomplete   Culture     Final   Value: NO ANAEROBES ISOLATED; CULTURE IN PROGRESS FOR 5 DAYS   Report Status PENDING   Incomplete     Studies: No results found.  Scheduled Meds: . ampicillin-sulbactam (UNASYN) IV  3 g Intravenous Q8H  . dexmedetomidine  1 mcg/kg Intravenous Once  . docusate sodium  100 mg Oral BID  . heparin  5,000 Units Subcutaneous Q8H  . insulin aspart  0-20 Units Subcutaneous Q4H  . insulin aspart protamine- aspart  45 Units Subcutaneous BID WC  . morphine  30 mg Oral Q12H  . nicotine  14 mg Transdermal Daily  . pantoprazole  40 mg Oral Daily   Continuous Infusions: . sodium chloride 125 mL/hr at 10/15/12 1411    Principal Problem:   Abscess Active Problems:   Drug abuse, IV   DM (dermatomyositis)   HTN (hypertension)   Renal failure, acute   Leukocytosis    Time spent: 25 min    [facility] Pager [phone] If 7PM-7AM, please contact night-coverage at [email or web address], password [facility] 10/15/2012, 4:18 PM  LOS: 2 days

## 2012-10-15 NOTE — Progress Notes (Addendum)
Inpatient Diabetes Program Recommendations  AACE/ADA: New Consensus Statement on Inpatient Glycemic Control (2013)  Target Ranges:  Prepandial:   less than 140 mg/dL      Peak postprandial:   less than 180 mg/dL (1-2 hours)      Critically ill patients:  140 - 180 mg/dL   Results for TREAVOR, UN (MRN [id number]) as of 10/15/2012 08:15  Ref. Range 10/14/2012 08:57 10/14/2012 11:38 10/14/2012 15:18 10/14/2012 16:16 10/14/2012 20:06 10/14/2012 22:18 10/15/2012 07:04 10/15/2012 07:06  Glucose-Capillary Latest Range: 70-99 mg/dL 212 (H) 290 (H) 219 (H) 191 (H) 211 (H) 330 (H) >600 (HH) 588 (HH)    Inpatient Diabetes Program Recommendations Correction (SSI): Please order Novolog moderate correction AC with meals.    Note: Patient has a history of diabetes and takes 70/30 55 units with breakfast, 70/30 45 units with supper, and Novolog 4-16 units TID at home for diabetes management.  Currently, patient is ordered to receive 70/30 45 units BID and Novolog 0-5 units HS for inpatient glycemic control.  Please note that patient DID NOT receive any basal insulin (70/30) at all on 10/14/2012, which is likely cause of blood glucose of 588 mg/dl this morning.  Noted 70/30 45 units has already been given this morning at 7:47 am.  Please order Novolog moderate correction AC with meals.  May need to increase am dose of 70/30 closer to home dose.  Will continue to follow.  Thanks, Barnie Alderman, RN, MSN, CCRN Diabetes Coordinator Inpatient Diabetes Program [phone]    10/15/12@11 :04 - Noted that 70/30 has been discontinued and Novolog moderate correction Q4H has been ordered by Dr. Maryland Pink.  Patient is now NPO and agree with discontinuing 70/30 but patient will need basal insulin.  Recommend ordering basal insulin (Levemir 40 units QHS).  Will continue to follow and text page Dr. Maryland Pink regarding recommendations.   Thanks, Barnie Alderman, RN, MSN, CCRN Diabetes Coordinator Inpatient Diabetes  Program [phone]

## 2012-10-16 ENCOUNTER — Encounter (HOSPITAL_COMMUNITY): Payer: Self-pay | Admitting: *Deleted

## 2012-10-16 ENCOUNTER — Encounter (HOSPITAL_COMMUNITY)
Admission: EM | Disposition: A | Discharge status: Home / Self Care | Payer: Self-pay | Source: Home / Self Care | Attending: Internal Medicine

## 2012-10-16 ENCOUNTER — Inpatient Hospital Stay (HOSPITAL_COMMUNITY): Payer: Medicaid Other | Admitting: *Deleted

## 2012-10-16 DIAGNOSIS — E1165 Type 2 diabetes mellitus with hyperglycemia: Secondary | ICD-10-CM

## 2012-10-16 DIAGNOSIS — N058 Unspecified nephritic syndrome with other morphologic changes: Secondary | ICD-10-CM

## 2012-10-16 DIAGNOSIS — E1129 Type 2 diabetes mellitus with other diabetic kidney complication: Secondary | ICD-10-CM

## 2012-10-16 DIAGNOSIS — F1910 Other psychoactive substance abuse, uncomplicated: Secondary | ICD-10-CM

## 2012-10-16 DIAGNOSIS — N184 Chronic kidney disease, stage 4 (severe): Secondary | ICD-10-CM

## 2012-10-16 HISTORY — PX: I & D EXTREMITY: SHX5045

## 2012-10-16 LAB — ANAEROBIC CULTURE

## 2012-10-16 LAB — CBC
Platelets: 621 10*3/uL — ABNORMAL HIGH (ref 150–400)
RDW: 13.5 % (ref 11.5–15.5)
WBC: 13.7 10*3/uL — ABNORMAL HIGH (ref 4.0–10.5)

## 2012-10-16 LAB — GLUCOSE, CAPILLARY
Glucose-Capillary: 105 mg/dL — ABNORMAL HIGH (ref 70–99)
Glucose-Capillary: 118 mg/dL — ABNORMAL HIGH (ref 70–99)
Glucose-Capillary: 128 mg/dL — ABNORMAL HIGH (ref 70–99)

## 2012-10-16 SURGERY — IRRIGATION AND DEBRIDEMENT EXTREMITY
Anesthesia: General | Site: Arm Lower | Laterality: Left | Wound class: Dirty or Infected

## 2012-10-16 MED ORDER — HYDROMORPHONE HCL PF 1 MG/ML IJ SOLN
0.2500 mg | INTRAMUSCULAR | Status: AC | PRN
  Administered 2012-10-16 – 2012-10-17 (×5): 0.5 mg via INTRAVENOUS
  Administered 2012-10-17 (×2): 5 mg via INTRAVENOUS
  Administered 2012-10-17: 0.5 mg via INTRAVENOUS

## 2012-10-16 MED ORDER — MORPHINE SULFATE ER 15 MG PO TBCR
30.0000 mg | EXTENDED_RELEASE_TABLET | ORAL | Status: AC
  Administered 2012-10-16: 30 mg via ORAL
  Filled 2012-10-16: qty 2

## 2012-10-16 MED ORDER — ONDANSETRON HCL 4 MG/2ML IJ SOLN: 4.0000 mg | Freq: Once | INTRAMUSCULAR | Status: AC | PRN

## 2012-10-16 MED ORDER — HYDROMORPHONE 0.3 MG/ML IV SOLN
INTRAVENOUS | Status: DC
  Administered 2012-10-16: 2.7 mg via INTRAVENOUS
  Administered 2012-10-16: 2.4 mg via INTRAVENOUS
  Administered 2012-10-17: 7.2 mg via INTRAVENOUS
  Administered 2012-10-17: 2.7 mg via INTRAVENOUS
  Administered 2012-10-17: 7 mg via INTRAVENOUS
  Administered 2012-10-17 – 2012-10-18 (×5): via INTRAVENOUS
  Administered 2012-10-18: 4.8 mg via INTRAVENOUS
  Administered 2012-10-18: 4.5 mg via INTRAVENOUS
  Administered 2012-10-18: 19:00:00 via INTRAVENOUS
  Administered 2012-10-18: 4.8 mg via INTRAVENOUS
  Administered 2012-10-18: 05:00:00 via INTRAVENOUS
  Administered 2012-10-18: 4.8 mg via INTRAVENOUS
  Administered 2012-10-19: 3.9 mg via INTRAVENOUS
  Administered 2012-10-19: 4.8 mg via INTRAVENOUS
  Administered 2012-10-19: 2.1 mg via INTRAVENOUS
  Administered 2012-10-19 (×2): via INTRAVENOUS
  Administered 2012-10-19: 9 mg via INTRAVENOUS
  Administered 2012-10-19: via INTRAVENOUS
  Administered 2012-10-20: 2.1 mg via INTRAVENOUS
  Administered 2012-10-20: 3.48 mg via INTRAVENOUS
  Administered 2012-10-20: 8.4 mg via INTRAVENOUS
  Administered 2012-10-20: 15:00:00 via INTRAVENOUS
  Administered 2012-10-20: 3.24 mg via INTRAVENOUS
  Administered 2012-10-21: 1.5 mg via INTRAVENOUS
  Administered 2012-10-21: 1.8 mg via INTRAVENOUS
  Administered 2012-10-21: 3.3 mg via INTRAVENOUS
  Administered 2012-10-21: 6.3 mg via INTRAVENOUS
  Administered 2012-10-21: 2.7 mg via INTRAVENOUS
  Administered 2012-10-21: 8.7 mg via INTRAVENOUS
  Administered 2012-10-21: 13:00:00 via INTRAVENOUS
  Filled 2012-10-16 (×16): qty 25

## 2012-10-16 MED ORDER — DEXTROSE 50 % IV SOLN
1.0000 | Freq: Once | INTRAVENOUS | Status: AC
  Administered 2012-10-16: 50 mL via INTRAVENOUS

## 2012-10-16 MED ORDER — DIPHENHYDRAMINE HCL 50 MG/ML IJ SOLN
12.5000 mg | Freq: Four times a day (QID) | INTRAMUSCULAR | Status: DC | PRN
  Administered 2012-10-18 – 2012-10-19 (×5): 12.5 mg via INTRAVENOUS
  Filled 2012-10-16 (×5): qty 1

## 2012-10-16 MED ORDER — DEXTROSE 50 % IV SOLN
INTRAVENOUS | Status: AC
  Filled 2012-10-16: qty 50

## 2012-10-16 MED ORDER — OXYCODONE HCL 5 MG/5ML PO SOLN
ORAL | Status: AC
  Filled 2012-10-16: qty 5

## 2012-10-16 MED ORDER — SODIUM CHLORIDE 0.9 % IR SOLN
Status: DC | PRN
  Administered 2012-10-16: 3000 mL

## 2012-10-16 MED ORDER — OXYCODONE HCL 5 MG PO TABS: 5.0000 mg | ORAL_TABLET | Freq: Once | ORAL | Status: AC | PRN

## 2012-10-16 MED ORDER — SODIUM CHLORIDE 0.9 % IJ SOLN: 9.0000 mL | INTRAMUSCULAR | Status: DC | PRN

## 2012-10-16 MED ORDER — INSULIN ASPART 100 UNIT/ML ~~LOC~~ SOLN
0.0000 [IU] | SUBCUTANEOUS | Status: DC
  Administered 2012-10-17: 2 [IU] via SUBCUTANEOUS
  Administered 2012-10-17: 5 [IU] via SUBCUTANEOUS

## 2012-10-16 MED ORDER — LACTATED RINGERS IV SOLN
INTRAVENOUS | Status: DC | PRN
  Administered 2012-10-16 (×2): via INTRAVENOUS

## 2012-10-16 MED ORDER — FENTANYL CITRATE 0.05 MG/ML IJ SOLN
INTRAMUSCULAR | Status: AC
  Filled 2012-10-16: qty 2

## 2012-10-16 MED ORDER — MORPHINE SULFATE ER 15 MG PO TBCR: 60.0000 mg | EXTENDED_RELEASE_TABLET | Freq: Two times a day (BID) | ORAL | Status: DC

## 2012-10-16 MED ORDER — HYDROMORPHONE HCL PF 1 MG/ML IJ SOLN
0.5000 mg | Freq: Once | INTRAMUSCULAR | Status: AC
  Administered 2012-10-16: 0.5 mg via INTRAVENOUS
  Filled 2012-10-16: qty 1

## 2012-10-16 MED ORDER — SODIUM CHLORIDE 0.9 % IR SOLN
Status: DC | PRN
  Administered 2012-10-16: 1000 mL

## 2012-10-16 MED ORDER — HYDROMORPHONE 0.3 MG/ML IV SOLN
INTRAVENOUS | Status: AC
  Filled 2012-10-16: qty 25

## 2012-10-16 MED ORDER — OXYCODONE HCL 5 MG/5ML PO SOLN
5.0000 mg | Freq: Once | ORAL | Status: AC | PRN
  Administered 2012-10-16: 5 mg via ORAL

## 2012-10-16 MED ORDER — NALOXONE HCL 0.4 MG/ML IJ SOLN: 0.4000 mg | INTRAMUSCULAR | Status: DC | PRN

## 2012-10-16 MED ORDER — FENTANYL CITRATE 0.05 MG/ML IJ SOLN
INTRAMUSCULAR | Status: DC | PRN
  Administered 2012-10-16: 50 ug via INTRAVENOUS
  Administered 2012-10-16 (×2): 100 ug via INTRAVENOUS
  Administered 2012-10-16 (×2): 50 ug via INTRAVENOUS
  Administered 2012-10-16: 100 ug via INTRAVENOUS

## 2012-10-16 MED ORDER — ONDANSETRON HCL 4 MG/2ML IJ SOLN: 4.0000 mg | Freq: Four times a day (QID) | INTRAMUSCULAR | Status: DC | PRN

## 2012-10-16 MED ORDER — DIPHENHYDRAMINE HCL 12.5 MG/5ML PO ELIX: 12.5000 mg | ORAL_SOLUTION | Freq: Four times a day (QID) | ORAL | Status: DC | PRN

## 2012-10-16 MED ORDER — MIDAZOLAM HCL 5 MG/5ML IJ SOLN
INTRAMUSCULAR | Status: DC | PRN
  Administered 2012-10-16: 2 mg via INTRAVENOUS

## 2012-10-16 MED ORDER — PROPOFOL 10 MG/ML IV BOLUS
INTRAVENOUS | Status: DC | PRN
  Administered 2012-10-16: 200 mg via INTRAVENOUS

## 2012-10-16 MED ORDER — LIDOCAINE HCL (CARDIAC) 10 MG/ML IV SOLN
INTRAVENOUS | Status: DC | PRN
  Administered 2012-10-16: 80 mg via INTRAVENOUS

## 2012-10-16 MED ORDER — HYDROMORPHONE HCL PF 1 MG/ML IJ SOLN
INTRAMUSCULAR | Status: AC
  Filled 2012-10-16: qty 2

## 2012-10-16 MED ORDER — CHLORHEXIDINE GLUCONATE 4 % EX LIQD
60.0000 mL | Freq: Once | CUTANEOUS | Status: AC
  Administered 2012-10-16: 4 via TOPICAL
  Filled 2012-10-16: qty 60

## 2012-10-16 SURGICAL SUPPLY — 54 items
BANDAGE CONFORM 2  STR LF (GAUZE/BANDAGES/DRESSINGS) IMPLANT
BANDAGE ELASTIC 3 VELCRO ST LF (GAUZE/BANDAGES/DRESSINGS) ×2 IMPLANT
BANDAGE ELASTIC 4 VELCRO ST LF (GAUZE/BANDAGES/DRESSINGS) ×2 IMPLANT
BANDAGE GAUZE ELAST BULKY 4 IN (GAUZE/BANDAGES/DRESSINGS) ×2 IMPLANT
BNDG COHESIVE 1X5 TAN STRL LF (GAUZE/BANDAGES/DRESSINGS) IMPLANT
BNDG ESMARK 4X9 LF (GAUZE/BANDAGES/DRESSINGS) ×2 IMPLANT
CLOTH BEACON ORANGE TIMEOUT ST (SAFETY) ×2 IMPLANT
CORDS BIPOLAR (ELECTRODE) ×2 IMPLANT
COVER SURGICAL LIGHT HANDLE (MISCELLANEOUS) ×2 IMPLANT
CUFF TOURNIQUET SINGLE 18IN (TOURNIQUET CUFF) ×2 IMPLANT
CUFF TOURNIQUET SINGLE 24IN (TOURNIQUET CUFF) IMPLANT
DRAIN PENROSE 1/2X12 LTX STRL (WOUND CARE) ×4 IMPLANT
DRAIN PENROSE 1/4X12 LTX STRL (WOUND CARE) IMPLANT
DRAPE SURG 17X23 STRL (DRAPES) ×4 IMPLANT
DRSG ADAPTIC 3X8 NADH LF (GAUZE/BANDAGES/DRESSINGS) ×2 IMPLANT
DRSG PAD ABDOMINAL 8X10 ST (GAUZE/BANDAGES/DRESSINGS) ×2 IMPLANT
ELECT REM PT RETURN 9FT ADLT (ELECTROSURGICAL) ×2
ELECTRODE REM PT RTRN 9FT ADLT (ELECTROSURGICAL) ×1 IMPLANT
GAUZE XEROFORM 1X8 LF (GAUZE/BANDAGES/DRESSINGS) ×2 IMPLANT
GAUZE XEROFORM 5X9 LF (GAUZE/BANDAGES/DRESSINGS) IMPLANT
GLOVE BIOGEL PI IND STRL 8.5 (GLOVE) ×1 IMPLANT
GLOVE BIOGEL PI INDICATOR 8.5 (GLOVE) ×1
GLOVE SURG ORTHO 8.0 STRL STRW (GLOVE) ×2 IMPLANT
GOWN PREVENTION PLUS XLARGE (GOWN DISPOSABLE) ×2 IMPLANT
GOWN STRL NON-REIN LRG LVL3 (GOWN DISPOSABLE) ×6 IMPLANT
HANDPIECE INTERPULSE COAX TIP (DISPOSABLE)
KIT BASIN OR (CUSTOM PROCEDURE TRAY) ×2 IMPLANT
KIT ROOM TURNOVER OR (KITS) ×2 IMPLANT
MANIFOLD NEPTUNE II (INSTRUMENTS) ×2 IMPLANT
NEEDLE HYPO 25GX1X1/2 BEV (NEEDLE) IMPLANT
NS IRRIG 1000ML POUR BTL (IV SOLUTION) ×2 IMPLANT
PACK ORTHO EXTREMITY (CUSTOM PROCEDURE TRAY) ×2 IMPLANT
PAD ARMBOARD 7.5X6 YLW CONV (MISCELLANEOUS) ×4 IMPLANT
PAD CAST 4YDX4 CTTN HI CHSV (CAST SUPPLIES) ×1 IMPLANT
PADDING CAST COTTON 4X4 STRL (CAST SUPPLIES) ×1
SET HNDPC FAN SPRY TIP SCT (DISPOSABLE) IMPLANT
SOAP 2 % CHG 4 OZ (WOUND CARE) ×2 IMPLANT
SPLINT FIBERGLASS 4X30 (CAST SUPPLIES) ×2 IMPLANT
SPONGE GAUZE 4X4 12PLY (GAUZE/BANDAGES/DRESSINGS) ×2 IMPLANT
SPONGE LAP 18X18 X RAY DECT (DISPOSABLE) ×2 IMPLANT
SPONGE LAP 4X18 X RAY DECT (DISPOSABLE) ×2 IMPLANT
SUCTION FRAZIER TIP 10 FR DISP (SUCTIONS) ×2 IMPLANT
SUT ETHILON 2 0 PSLX (SUTURE) ×10 IMPLANT
SUT ETHILON 4 0 PS 2 18 (SUTURE) IMPLANT
SUT ETHILON 5 0 P 3 18 (SUTURE) ×1
SUT NYLON ETHILON 5-0 P-3 1X18 (SUTURE) ×1 IMPLANT
SYR CONTROL 10ML LL (SYRINGE) IMPLANT
TOWEL OR 17X24 6PK STRL BLUE (TOWEL DISPOSABLE) ×2 IMPLANT
TOWEL OR 17X26 10 PK STRL BLUE (TOWEL DISPOSABLE) ×2 IMPLANT
TUBE ANAEROBIC SPECIMEN COL (MISCELLANEOUS) IMPLANT
TUBE CONNECTING 12X1/4 (SUCTIONS) ×2 IMPLANT
UNDERPAD 30X30 INCONTINENT (UNDERPADS AND DIAPERS) ×2 IMPLANT
WATER STERILE IRR 1000ML POUR (IV SOLUTION) IMPLANT
YANKAUER SUCT BULB TIP NO VENT (SUCTIONS) ×2 IMPLANT

## 2012-10-16 NOTE — Progress Notes (Signed)
[facility] PROGRESS NOTE  Luke [name] [id number] DOB: Jul 19, 1980 DOA: 10/13/2012 PCP: Default, Provider, MD  Assessment/Plan: Principal Problem:   Abscess: Status post treatment. Continue IV antibiotics. Repeat  I & D later today. Patient continuing to have severe pain using when necessary Dilaudid and morphine often. Have increased his MS Contin extended release explanation that continual pain relief while not providing immediate relief, we'll give him better long-term pain control.  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    HTN (hypertension): Elevated blood pressures, secondary to pain an& withdrawal, slowly improving  Chronic kidney disease stage IV-looks to be at baseline.    Leukocytosis-improving with debridement. Continue IV antibiotics, recheck   Code Status: Full  Family Communication: Plan discussed with family at the bedside  Disposition Plan: Home eventually    Consultants:  Dr. Carollee Leitz surgery  Procedures:  Status post irrigation and debridement of left forearm on 7/29  Antibiotics:  IV Unasyn day 3  HPI/Subjective:  patient continues to complain of severe left upper extremity pain   Objective: Filed Vitals:   10/15/12 1412 10/15/12 2110 10/16/12 0233 10/16/12 0517  BP: 149/84 125/61  152/87  Pulse: 111 100  97  Temp: 99.3 F (37.4 C) 100.3 F (37.9 C) 97.9 F (36.6 C) 98.8 F (37.1 C)  TempSrc:  Oral Oral Oral  Resp: 20 18  18   Height:      Weight:      SpO2: 98% 97%  100%    Intake/Output Summary (Last 24 hours) at 10/16/12 1153 Last data filed at 10/16/12 0516  Gross per 24 hour  Intake    820 ml  Output      0 ml  Net    820 ml   Filed Weights   10/14/12 0900  Weight: 86.2 kg (190 lb 0.6 oz)    Exam:   General:  Moderate distress secondary to pain, alert and oriented x3  Cardiovascular: Regular rate and  rhythm, S1-S2  Respiratory: Clear to auscultation bilaterally  Abdomen: Soft, NT, ND +BS  Musculoskeletal: Left UE wrapped, tender  Data Reviewed: Basic Metabolic Panel:  Recent Labs Lab 10/11/12 0057 10/12/12 0951 10/12/12 1341 10/13/12 0815 10/14/12 0032 10/14/12 0820 10/15/12 1140  NA 131* 130*  --  133* 137 137 133*  K 3.9 3.7  --  4.0 3.7 3.4* 3.8  CL 97 95*  --  99 103 103 98  CO2  --  22  --  24  --  24 24  GLUCOSE 393* 309* 216* 483*  481* 300* 245* 454*  BUN 15 15  --  18 16 16 12   CREATININE 1.60* 1.71*  --  2.64* 2.60* 2.47* 2.21*  CALCIUM  --  8.6  --  7.9*  --  8.5 8.7   Liver Function Tests:  Recent Labs Lab 10/14/12 0820  AST 12  ALT 8  ALKPHOS 136*  BILITOT 0.2*  PROT 5.4*  ALBUMIN 2.0*   CBC:  Recent Labs Lab 10/11/12 0012  10/12/12 0951 10/13/12 0815 10/14/12 0027 10/14/12 0032 10/14/12 0820  WBC 30.7*  --  22.2* 21.9* 18.5*  --  16.4*  NEUTROABS 25.4*  --   --   --   --   --  10.8*  HGB 13.1  < > 10.1* 8.2* 8.6* 8.8* 8.0*  HCT 35.9*  < > 29.6* 24.3* 25.5* 26.0* 22.4*  MCV 82.7  --  83.4 84.1 83.6  --  82.7  PLT 469*  --  338 354 415*  --  438*  < > = values in this interval not displayed. CBG:  Recent Labs Lab 10/15/12 0706 10/15/12 1053 10/15/12 1618 10/15/12 2059 10/16/12 0622  GLUCAP 588* 510* 133* 76 92    Recent Results (from the past 240 hour(s))  CULTURE, BLOOD (ROUTINE X 2)     Status: None   Collection Time    10/11/12  3:00 AM      Result Value Range Status   Specimen Description BLOOD RIGHT WRIST   Final   Special Requests BOTTLES DRAWN AEROBIC ONLY 10CC   Final   Culture  Setup Time 10/11/2012 18:12   Final   Culture     Final   Value:        BLOOD CULTURE RECEIVED NO GROWTH TO DATE CULTURE WILL BE HELD FOR 5 DAYS BEFORE ISSUING A FINAL NEGATIVE REPORT   Report Status PENDING   Incomplete  CULTURE, BLOOD (ROUTINE X 2)     Status: None   Collection Time    10/11/12  3:15 AM      Result Value Range  Status   Specimen Description BLOOD RIGHT ARM   Final   Special Requests BOTTLES DRAWN AEROBIC ONLY 6CC    Final   Culture  Setup Time 10/11/2012 18:13   Final   Culture     Final   Value:        BLOOD CULTURE RECEIVED NO GROWTH TO DATE CULTURE WILL BE HELD FOR 5 DAYS BEFORE ISSUING A FINAL NEGATIVE REPORT   Report Status PENDING   Incomplete  WOUND CULTURE     Status: None   Collection Time    10/11/12 10:32 AM      Result Value Range Status   Specimen Description WOUND LEFT ARM   Final   Special Requests SUPERFICIAL   Final   Gram Stain     Final   Value: RARE WBC PRESENT,BOTH PMN AND MONONUCLEAR     NO SQUAMOUS EPITHELIAL CELLS SEEN     FEW GRAM POSITIVE COCCI     IN PAIRS IN CLUSTERS   Culture     Final   Value: FEW STAPHYLOCOCCUS SPECIES (COAGULASE NEGATIVE)     Note: RIFAMPIN AND GENTAMICIN SHOULD NOT BE USED AS SINGLE DRUGS FOR TREATMENT OF STAPH INFECTIONS.   Report Status 10/15/2012 FINAL   Final   Organism ID, Bacteria STAPHYLOCOCCUS SPECIES (COAGULASE NEGATIVE)   Final  AFB CULTURE WITH SMEAR     Status: None   Collection Time    10/11/12 10:32 AM      Result Value Range Status   Specimen Description WOUND LEFT ARM   Final   Special Requests SUPERFICIAL   Final   ACID FAST SMEAR NO ACID FAST BACILLI SEEN   Final   Culture     Final   Value: CULTURE WILL BE EXAMINED FOR 6 WEEKS BEFORE ISSUING A FINAL REPORT   Report Status PENDING   Incomplete  ANAEROBIC CULTURE     Status: None   Collection Time    10/11/12 10:32 AM      Result Value Range Status   Specimen Description WOUND LEFT ARM   Final   Special Requests SUPERFICIAL   Final   Gram Stain PENDING   Incomplete   Culture     Final   Value: NO ANAEROBES ISOLATED; CULTURE IN PROGRESS FOR 5 DAYS  Report Status PENDING   Incomplete  WOUND CULTURE     Status: None   Collection Time    10/11/12 10:32 AM      Result Value Range Status   Specimen Description WOUND LEFT ARM   Final   Special Requests DEEP   Final    Gram Stain     Final   Value: RARE WBC PRESENT,BOTH PMN AND MONONUCLEAR     RARE SQUAMOUS EPITHELIAL CELLS PRESENT     FEW GRAM POSITIVE COCCI     IN PAIRS IN CLUSTERS FEW GRAM NEGATIVE RODS   Culture     Final   Value: MULTIPLE ORGANISMS PRESENT, NONE PREDOMINANT     Note: NO STAPHYLOCOCCUS AUREUS ISOLATED NO GROUP A STREP (S.PYOGENES) ISOLATED   Report Status 10/13/2012 FINAL   Final  AFB CULTURE WITH SMEAR     Status: None   Collection Time    10/11/12 10:32 AM      Result Value Range Status   Specimen Description WOUND LEFT ARM   Final   Special Requests DEEP   Final   ACID FAST SMEAR NO ACID FAST BACILLI SEEN   Final   Culture     Final   Value: CULTURE WILL BE EXAMINED FOR 6 WEEKS BEFORE ISSUING A FINAL REPORT   Report Status PENDING   Incomplete  ANAEROBIC CULTURE     Status: None   Collection Time    10/11/12 10:32 AM      Result Value Range Status   Specimen Description WOUND LEFT ARM   Final   Special Requests DEEP   Final   Gram Stain PENDING   Incomplete   Culture     Final   Value: NO ANAEROBES ISOLATED; CULTURE IN PROGRESS FOR 5 DAYS   Report Status PENDING   Incomplete     Studies: No results found.  Scheduled Meds: . ampicillin-sulbactam (UNASYN) IV  3 g Intravenous Q8H  . dexmedetomidine  1 mcg/kg Intravenous Once  . docusate sodium  100 mg Oral BID  . heparin  5,000 Units Subcutaneous Q8H  . insulin aspart  0-20 Units Subcutaneous TID WC  . insulin aspart  0-5 Units Subcutaneous QHS  . insulin aspart protamine- aspart  45 Units Subcutaneous BID WC  . morphine  30 mg Oral Q12H  . nicotine  14 mg Transdermal Daily  . pantoprazole  40 mg Oral Daily   Continuous Infusions: . sodium chloride 125 mL/hr at 10/15/12 1411    Principal Problem:   Abscess Active Problems:   Drug abuse, IV   DM type 2, uncontrolled, with renal complications   HTN (hypertension)   Chronic kidney disease, stage 4, severely decreased GFR   Leukocytosis    Time spent:  20 min    [facility] Pager [phone] If 7PM-7AM, please contact night-coverage at [email or web address], password [facility] 10/16/2012, 11:53 AM  LOS: 3 days

## 2012-10-16 NOTE — Progress Notes (Signed)
PLAN FOR REPEAT I/D TODAY GIVEN SEVERITY OF INFECTION WILL WRITE FOR PREOP ORDERS WILL SEE PATIENT TODAY. R/B/A DISCUSSED WITH PT IN HOSPITAL.  PT VOICED UNDERSTANDING OF PLAN CONSENT SIGNED DAY OF SURGERY PT SEEN AND EXAMINED PRIOR TO OPERATIVE PROCEDURE/DAY OF SURGERY SITE MARKED. QUESTIONS ANSWERED WILL REMAIN AN INPATIENT FOLLOWING SURGERY

## 2012-10-16 NOTE — Brief Op Note (Signed)
10/13/2012 - 10/16/2012  11:30 PM  PATIENT:  Luke [name]  32 y.o. male  PRE-OPERATIVE DIAGNOSIS:  infected l forearm  POST-OPERATIVE DIAGNOSIS:  Infected left forearm   PROCEDURE:  Procedure(s): IRRIGATION AND DEBRIDEMENT LEFT FOREARM (Left)  SURGEON:  Surgeon(s) and Role:    * Linna Hoff, MD - Primary  PHYSICIAN ASSISTANT: none  ASSISTANTS: none   ANESTHESIA:   general  EBL:  Total I/O In: 1000 [I.V.:1000] Out: -   BLOOD ADMINISTERED:none  DRAINS: Penrose drain in the left forearm   LOCAL MEDICATIONS USED:  NONE  SPECIMEN:  No Specimen  DISPOSITION OF SPECIMEN:  N/A  COUNTS:  YES  TOURNIQUET:  * Missing tourniquet times found for documented tourniquets in log:  [id number] *  DICTATION: .Other Dictation: Dictation Number [id number]  PLAN OF CARE: Admit to inpatient   PATIENT DISPOSITION:  PACU - hemodynamically stable.   Delay start of Pharmacological VTE agent (>24hrs) due to surgical blood loss or risk of bleeding: not applicable

## 2012-10-16 NOTE — Anesthesia Preprocedure Evaluation (Signed)
Anesthesia Evaluation  Patient identified by MRN, date of birth, ID band Patient awake    Reviewed: Allergy & Precautions, H&P , NPO status , Patient's Chart, lab work & pertinent test results  Airway Mallampati: I TM Distance: >3 FB Neck ROM: Full    Dental  (+) Teeth Intact and Dental Advisory Given   Pulmonary  breath sounds clear to auscultation        Cardiovascular hypertension, Pt. on medications Rhythm:Regular Rate:Normal     Neuro/Psych    GI/Hepatic   Endo/Other  diabetes, Well Controlled, Type 2, Insulin Dependent  Renal/GU Renal InsufficiencyRenal disease     Musculoskeletal   Abdominal   Peds  Hematology   Anesthesia Other Findings   Reproductive/Obstetrics                           Anesthesia Physical Anesthesia Plan  ASA: II  Anesthesia Plan: General   Post-op Pain Management:    Induction: Intravenous  Airway Management Planned: LMA  Additional Equipment:   Intra-op Plan:   Post-operative Plan: Extubation in OR  Informed Consent: I have reviewed the patients History and Physical, chart, labs and discussed the procedure including the risks, benefits and alternatives for the proposed anesthesia with the patient or authorized representative who has indicated his/her understanding and acceptance.   Dental advisory given  Plan Discussed with: CRNA, Anesthesiologist and Surgeon  Anesthesia Plan Comments:         Anesthesia Quick Evaluation

## 2012-10-17 ENCOUNTER — Encounter (HOSPITAL_COMMUNITY): Payer: Self-pay | Admitting: Orthopedic Surgery

## 2012-10-17 LAB — CBC
Hemoglobin: 7.2 g/dL — ABNORMAL LOW (ref 13.0–17.0)
MCH: 28.8 pg (ref 26.0–34.0)
MCV: 82 fL (ref 78.0–100.0)
RBC: 2.5 MIL/uL — ABNORMAL LOW (ref 4.22–5.81)

## 2012-10-17 LAB — CULTURE, BLOOD (ROUTINE X 2)

## 2012-10-17 LAB — BASIC METABOLIC PANEL
CO2: 25 mEq/L (ref 19–32)
Chloride: 108 mEq/L (ref 96–112)
Glucose, Bld: 213 mg/dL — ABNORMAL HIGH (ref 70–99)
Potassium: 3.5 mEq/L (ref 3.5–5.1)
Sodium: 144 mEq/L (ref 135–145)

## 2012-10-17 LAB — GLUCOSE, CAPILLARY: Glucose-Capillary: 253 mg/dL — ABNORMAL HIGH (ref 70–99)

## 2012-10-17 MED ORDER — INSULIN ASPART 100 UNIT/ML ~~LOC~~ SOLN
0.0000 [IU] | Freq: Every day | SUBCUTANEOUS | Status: DC
Start: 2012-10-17 — End: 2012-10-17

## 2012-10-17 MED ORDER — HYDROMORPHONE HCL PF 1 MG/ML IJ SOLN
INTRAMUSCULAR | Status: AC
  Filled 2012-10-17: qty 2

## 2012-10-17 MED ORDER — SODIUM CHLORIDE 0.9 % IV SOLN
3.0000 g | Freq: Four times a day (QID) | INTRAVENOUS | Status: DC
  Administered 2012-10-17 – 2012-10-23 (×24): 3 g via INTRAVENOUS
  Filled 2012-10-17 (×28): qty 3

## 2012-10-17 MED ORDER — INSULIN ASPART 100 UNIT/ML ~~LOC~~ SOLN
0.0000 [IU] | Freq: Three times a day (TID) | SUBCUTANEOUS | Status: DC
  Administered 2012-10-18: 3 [IU] via SUBCUTANEOUS
  Administered 2012-10-18: 20 [IU] via SUBCUTANEOUS
  Administered 2012-10-18: 15 [IU] via SUBCUTANEOUS

## 2012-10-17 MED ORDER — LORAZEPAM 2 MG/ML IJ SOLN
0.2500 mg | Freq: Three times a day (TID) | INTRAMUSCULAR | Status: DC
  Administered 2012-10-17 – 2012-10-19 (×7): 0.25 mg via INTRAVENOUS
  Filled 2012-10-17 (×7): qty 1

## 2012-10-17 MED ORDER — INSULIN ASPART 100 UNIT/ML ~~LOC~~ SOLN
0.0000 [IU] | Freq: Three times a day (TID) | SUBCUTANEOUS | Status: DC
  Administered 2012-10-17: 2 [IU] via SUBCUTANEOUS
  Administered 2012-10-17: 7 [IU] via SUBCUTANEOUS

## 2012-10-17 MED ORDER — INSULIN ASPART 100 UNIT/ML ~~LOC~~ SOLN
0.0000 [IU] | Freq: Every day | SUBCUTANEOUS | Status: DC
  Administered 2012-10-17: 5 [IU] via SUBCUTANEOUS

## 2012-10-17 MED ORDER — HYDROMORPHONE HCL 2 MG PO TABS
2.0000 mg | ORAL_TABLET | ORAL | Status: AC | PRN
  Administered 2012-10-17 – 2012-10-18 (×2): 2 mg via ORAL
  Filled 2012-10-17 (×2): qty 1

## 2012-10-17 NOTE — Progress Notes (Signed)
Dr. Al Corpus called pt has had 2mg  of Hydromorphone,orders received to give up to another 4 mg

## 2012-10-17 NOTE — Progress Notes (Signed)
ANTIBIOTIC CONSULT NOTE - Follow-up  Pharmacy Consult for Ampicillin/Sulbactam Indication: Fever/Leukocytosis/Left arm abscess  No Known Allergies  Patient Measurements: Height: 5\' 9"  (175.3 cm)  Weight: 190 lb (86.183 kg)  IBW/kg (Calculated) : 70.7  Vital Signs: Temp: 99.3 F (37.4 C) (08/01 0623) Temp src: Oral (08/01 0400) BP: 151/80 mmHg (08/01 0400) Pulse Rate: 100 (08/01 0400)  Labs:  Recent Labs  10/15/12 1140 10/16/12 1444 10/17/12 0533  WBC  --  13.7* 15.7*  HGB  --  7.9* 7.2*  PLT  --  621* 643*  CREATININE 2.21*  --  1.89*   Microbiology: Recent Results (from the past 720 hour(s))  CULTURE, BLOOD (ROUTINE X 2)     Status: None   Collection Time    10/11/12  3:00 AM      Result Value Range Status   Specimen Description BLOOD RIGHT WRIST   Final   Special Requests BOTTLES DRAWN AEROBIC ONLY 10CC   Final   Culture  Setup Time 10/11/2012 18:12   Final   Culture NO GROWTH 5 DAYS   Final   Report Status 10/17/2012 FINAL   Final  CULTURE, BLOOD (ROUTINE X 2)     Status: None   Collection Time    10/11/12  3:15 AM      Result Value Range Status   Specimen Description BLOOD RIGHT ARM   Final   Special Requests BOTTLES DRAWN AEROBIC ONLY 6CC    Final   Culture  Setup Time 10/11/2012 18:13   Final   Culture NO GROWTH 5 DAYS   Final   Report Status 10/17/2012 FINAL   Final  WOUND CULTURE     Status: None   Collection Time    10/11/12 10:32 AM      Result Value Range Status   Specimen Description WOUND LEFT ARM   Final   Special Requests SUPERFICIAL   Final   Gram Stain     Final   Value: RARE WBC PRESENT,BOTH PMN AND MONONUCLEAR     NO SQUAMOUS EPITHELIAL CELLS SEEN     FEW GRAM POSITIVE COCCI     IN PAIRS IN CLUSTERS   Culture     Final   Value: FEW STAPHYLOCOCCUS SPECIES (COAGULASE NEGATIVE)     Note: RIFAMPIN AND GENTAMICIN SHOULD NOT BE USED AS SINGLE DRUGS FOR TREATMENT OF STAPH INFECTIONS.   Report Status 10/15/2012 FINAL   Final   Organism ID,  Bacteria STAPHYLOCOCCUS SPECIES (COAGULASE NEGATIVE)   Final  AFB CULTURE WITH SMEAR     Status: None   Collection Time    10/11/12 10:32 AM      Result Value Range Status   Specimen Description WOUND LEFT ARM   Final   Special Requests SUPERFICIAL   Final   ACID FAST SMEAR NO ACID FAST BACILLI SEEN   Final   Culture     Final   Value: CULTURE WILL BE EXAMINED FOR 6 WEEKS BEFORE ISSUING A FINAL REPORT   Report Status PENDING   Incomplete  ANAEROBIC CULTURE     Status: None   Collection Time    10/11/12 10:32 AM      Result Value Range Status   Specimen Description WOUND LEFT ARM   Final   Special Requests SUPERFICIAL   Final   Gram Stain     Final   Value: RARE WBC PRESENT,BOTH PMN AND MONONUCLEAR     NO SQUAMOUS EPITHELIAL CELLS SEEN     FEW GRAM POSITIVE  COCCI     IN PAIRS IN CLUSTERS   Culture NO ANAEROBES ISOLATED   Final   Report Status 10/16/2012 FINAL   Final  WOUND CULTURE     Status: None   Collection Time    10/11/12 10:32 AM      Result Value Range Status   Specimen Description WOUND LEFT ARM   Final   Special Requests DEEP   Final   Gram Stain     Final   Value: RARE WBC PRESENT,BOTH PMN AND MONONUCLEAR     RARE SQUAMOUS EPITHELIAL CELLS PRESENT     FEW GRAM POSITIVE COCCI     IN PAIRS IN CLUSTERS FEW GRAM NEGATIVE RODS   Culture     Final   Value: MULTIPLE ORGANISMS PRESENT, NONE PREDOMINANT     Note: NO STAPHYLOCOCCUS AUREUS ISOLATED NO GROUP A STREP (S.PYOGENES) ISOLATED   Report Status 10/13/2012 FINAL   Final  AFB CULTURE WITH SMEAR     Status: None   Collection Time    10/11/12 10:32 AM      Result Value Range Status   Specimen Description WOUND LEFT ARM   Final   Special Requests DEEP   Final   ACID FAST SMEAR NO ACID FAST BACILLI SEEN   Final   Culture     Final   Value: CULTURE WILL BE EXAMINED FOR 6 WEEKS BEFORE ISSUING A FINAL REPORT   Report Status PENDING   Incomplete  ANAEROBIC CULTURE     Status: None   Collection Time    10/11/12 10:32 AM       Result Value Range Status   Specimen Description WOUND LEFT ARM   Final   Special Requests DEEP   Final   Gram Stain     Final   Value: RARE WBC PRESENT,BOTH PMN AND MONONUCLEAR     RARE SQUAMOUS EPITHELIAL CELLS PRESENT     FEW GRAM POSITIVE COCCI     IN PAIRS IN CLUSTERS FEW GRAM NEGATIVE RODS   Culture NO ANAEROBES ISOLATED   Final   Report Status 10/16/2012 FINAL   Final   Medications:  Anti-infectives   Start     Dose/Rate Route Frequency Ordered Stop   10/17/12 1700  Ampicillin-Sulbactam (UNASYN) 3 g in sodium chloride 0.9 % 100 mL IVPB     3 g 100 mL/hr over 60 Minutes Intravenous Every 6 hours 10/17/12 1130     10/14/12 1800  Ampicillin-Sulbactam (UNASYN) 3 g in sodium chloride 0.9 % 100 mL IVPB  Status:  Discontinued     3 g 100 mL/hr over 60 Minutes Intravenous Every 8 hours 10/14/12 1047 10/17/12 1130   10/14/12 0800  ampicillin-sulbactam (UNASYN) 1.5 g in sodium chloride 0.9 % 50 mL IVPB  Status:  Discontinued     1.5 g 100 mL/hr over 30 Minutes Intravenous Every 8 hours 10/14/12 0701 10/14/12 1046   10/14/12 0015  ciprofloxacin (CIPRO) IVPB 400 mg     400 mg 200 mL/hr over 60 Minutes Intravenous  Once 10/14/12 0008 10/14/12 0147   10/14/12 0015  vancomycin (VANCOCIN) IVPB 1000 mg/200 mL premix     1,000 mg 200 mL/hr over 60 Minutes Intravenous  Once 10/14/12 0008 10/14/12 0250     Assessment: 89 yom continues on unasyn for cellulitis/abscess. Today is approximately day #6 of therapy but pt did miss a few doses because he left AMA but then returned. Pt is afebrile and WBC is 15.7. S/p repeat I&D 7/31. Renal  function is improving.   Goal of Therapy:  Therapeutic response to IV antibiotics  Plan:  1.  Increase unasyn to 3gm IV Q6H 2.  F/u renal fxn, C&S, clinical status and planned LOT  Salome Arnt, PharmD, BCPS Pager # [phone] 10/17/2012 11:34 AM

## 2012-10-17 NOTE — Op Note (Signed)
Luke [name], DRENNON NO.:  [id number]  MEDICAL RECORD NO.:  [id number]  LOCATION:  [id number]                        FACILITY:  [facility]  PHYSICIAN:  Melrose Nakayama, MD  DATE OF BIRTH:  28-Nov-1980  DATE OF PROCEDURE:  10/16/2012 DATE OF DISCHARGE:  10/14/2012                              OPERATIVE REPORT   PREOPERATIVE DIAGNOSIS:  Left forearm deep abscess.  POSTOPERATIVE DIAGNOSIS:  Left forearm deep abscess.  PROCEDURE: 1. Incision and drainage of left forearm deep abscess. 2. Forearm decompressive fasciotomy, and debridement of nonviable     muscle excisional debridement of the flexor surfaces.  SURGEON:  Melrose Nakayama, MD, who scrubbed and present for the entire procedure.  ASSISTANT SURGEON:  None.  SURGICAL INDICATIONS:  Mr. 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  The patient presented with a severe infection in the upper extremity.  The patient was taken to the operating room on the day of presentation, underwent 3 prior I and Ds.  He was brought back today for repeat I and D.  DESCRIPTION OF PROCEDURE:  The patient was properly identified in the preop holding area and marked with a permanent marker made on the left forearm to indicate the correct operative site.  The patient was then brought back to the operating room, placed supine on the anesthesia table.  General anesthesia was administered.  The patient was received IV antibiotics per the Anesthesia.  A well-padded tourniquet was then placed in the left forearm and sealed with 1000 drape.  Left upper extremity was then prepped and draped in a normal sterile fashion.  Time- out was called, correct side was identified, and the procedure was then begun.  The incision was then opened up.  The patient's wound looked lot better today.  The patient did have a small amount of purulent fluid, but again looked a lot better.  The patient  did have nonviable muscle in the flexor compartment again in the FDS.  Further excisional debridement was then carried out with a rongeur, sharp scissors, and knife.  The devitalized muscle of the FDS to the index and small.  Further debridement was then carried out along the FCR and palmaris longus.  The median nerve was carefully protected throughout.  The area was then thoroughly irrigated with the pulsatile lavage.  Careful protection of the median nerve and ulnar neurovascular structures were then done.  The wound was then closed over with 2-0 nylon sutures.  Closed over a Penrose drain.  Adaptic dressing, sterile compressive bandage was applied.  The patient tolerated the procedure well and returned to recovery room in good condition.  POSTPROCEDURE PLAN:  The patient again has a severe infection, will likely return back to the operating room, at the first of next week for repeat I and D and potential closure and to continue inpatient course while he is undergoing care.     Melrose Nakayama, MD     FWO/MEDQ  D:  10/16/2012  T:  10/17/2012  Job:  [id number]

## 2012-10-17 NOTE — Transfer of Care (Signed)
Immediate Anesthesia Transfer of Care Note  [name]  Procedure(s) Performed: Procedure(s): IRRIGATION AND DEBRIDEMENT LEFT FOREARM (Left)  Patient Location: PACU  Anesthesia Type:General  Level of Consciousness: awake and sedated  Airway & Oxygen Therapy: Patient Spontanous Breathing and Patient connected to nasal cannula oxygen  Post-op Assessment: Report given to PACU RN, Post -op Vital signs reviewed and stable and Patient moving all extremities X 4  Post vital signs: Reviewed and stable  Complications: No apparent anesthesia complications

## 2012-10-17 NOTE — Anesthesia Postprocedure Evaluation (Signed)
  Anesthesia Post-op Note  [name]  Procedure(s) Performed: Procedure(s): IRRIGATION AND DEBRIDEMENT LEFT FOREARM (Left)  Patient Location: PACU  Anesthesia Type:General  Level of Consciousness: awake, alert  and oriented  Airway and Oxygen Therapy: Patient Spontanous Breathing  Post-op Pain: mild  Post-op Assessment: Post-op Vital signs reviewed  Post-op Vital Signs: Reviewed  Complications: No apparent anesthesia complications

## 2012-10-17 NOTE — Progress Notes (Signed)
Inpatient Diabetes Program Recommendations  AACE/ADA: New Consensus Statement on Inpatient Glycemic Control (2013)  Target Ranges:  Prepandial:   less than 140 mg/dL      Peak postprandial:   less than 180 mg/dL (1-2 hours)      Critically ill patients:  140 - 180 mg/dL   Reason for Visit: Uncontrolled Blood Sugars  Results for Luke [name], Luke [name] (MRN [id number]) as of 10/17/2012 15:20  Ref. Range 10/16/2012 11:55 10/16/2012 16:19 10/16/2012 18:01 10/16/2012 19:39 10/16/2012 23:43 10/17/2012 03:49 10/17/2012 07:55 10/17/2012 11:05  Glucose-Capillary Latest Range: 70-99 mg/dL 118 (H) 62 (L) 88 105 (H) 128 (H) 253 (H) 187 (H) 170 (H)   No basal insulin (70/30) since 7/31 am. Previous order was 70/30 45 units bid. Needs basal insulin.  Recommendations:  Levemir 40 units QHS. When po intake increases,  will need Novolog meal coverage insulin.  Will continue to follow.  Thank you. Lorenda Peck, RD, LDN, CDE Inpatient Diabetes Coordinator [phone]

## 2012-10-17 NOTE — Progress Notes (Signed)
Able to wiggle fingers just a little good capillary refill and feels sensation

## 2012-10-17 NOTE — Progress Notes (Signed)
PT SEEN/EXAMINED WIGGLES FINGERS PLAN: WILL LOOK AT WOUND Sunday AND D/C DRAINS MAY REQUIRE REPEAT I/D ON Sunday  CONTINUE INPATIENT CARE PT VOICED UNDERSTANDING OF PLAN

## 2012-10-17 NOTE — Progress Notes (Signed)
DR. Al Corpus called for a sign out

## 2012-10-17 NOTE — Progress Notes (Signed)
[facility] PROGRESS NOTE  Luke [name] [id number] DOB: 02/28/1981 DOA: 10/13/2012 PCP: Default, Provider, MD  Assessment/Plan: Principal Problem:   Abscess: Status post treatment. Continue IV antibiotics. Repeat  I & D later yesterday.  Having severe pains to discontinue all by mouth medications and put him on Dilaudid PCA which seemed to help somewhat. 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    Leukocytosis-improving with debridement. Continue IV antibiotics, next debridement for Monday.   Code Status: Full  Family Communication: Plan discussed with family at the bedside  Disposition Plan: Home eventually    Consultants:  Dr. Carollee Leitz surgery  Procedures:  Status post irrigation and debridement of left forearm on 7/29  Antibiotics:  IV Unasyn day 4  HPI/Subjective:  patient continues to complain of severe left upper extremity pain, feels very worn out  Objective: Filed Vitals:   10/17/12 0056 10/17/12 0125 10/17/12 0400 10/17/12 0623  BP: 166/89 175/100 151/80   Pulse: 106 111 100   Temp: 98.4 F (36.9 C) 99.4 F (37.4 C) 99.9 F (37.7 C) 99.3 F (37.4 C)  TempSrc:  Oral Oral   Resp: 16 18 18    Height:      Weight:      SpO2: 100% 98% 99%     Intake/Output Summary (Last 24 hours) at 10/17/12 1048 Last data filed at 10/17/12 0810  Gross per 24 hour  Intake   2080 ml  Output    925 ml  Net   1155 ml   Filed Weights   10/14/12 0900  Weight: 86.2 kg (190 lb 0.6 oz)    Exam:   General:  Moderate distress secondary to pain, alert and oriented x3  Cardiovascular: Regular rate and rhythm,  S1-S2  Respiratory: Clear to auscultation bilaterally  Abdomen: Soft, NT, ND +BS  Musculoskeletal: Left UE wrapped, tender  Data Reviewed: Basic Metabolic Panel:  Recent Labs Lab 10/12/12 0951  10/13/12 0815 10/14/12 0032 10/14/12 0820 10/15/12 1140 10/17/12 0533  NA 130*  --  133* 137 137 133* 144  K 3.7  --  4.0 3.7 3.4* 3.8 3.5  CL 95*  --  99 103 103 98 108  CO2 22  --  24  --  24 24 25   GLUCOSE 309*  < > 483*  481* 300* 245* 454* 213*  BUN 15  --  18 16 16 12 7   CREATININE 1.71*  --  2.64* 2.60* 2.47* 2.21* 1.89*  CALCIUM 8.6  --  7.9*  --  8.5 8.7 8.3*  < > = values in this interval not displayed. Liver Function Tests:  Recent Labs Lab 10/14/12 0820  AST 12  ALT 8  ALKPHOS 136*  BILITOT 0.2*  PROT 5.4*  ALBUMIN 2.0*   CBC:  Recent Labs Lab 10/11/12 0012  10/13/12 0815 10/14/12 0027 10/14/12 0032 10/14/12 0820 10/16/12 1444 10/17/12 0533  WBC 30.7*  < > 21.9* 18.5*  --  16.4* 13.7* 15.7*  NEUTROABS 25.4*  --   --   --   --  10.8*  --   --   HGB 13.1  < > 8.2* 8.6* 8.8* 8.0* 7.9* 7.2*  HCT 35.9*  < > 24.3* 25.5* 26.0* 22.4* 22.4* 20.5*  MCV 82.7  < > 84.1 83.6  --  82.7 81.5 82.0  PLT 469*  < > 354 415*  --  438* 621* 643*  < > = values in this interval not displayed. CBG:  Recent Labs Lab 10/16/12 1801 10/16/12 1939 10/16/12 2343 10/17/12 0349 10/17/12 0755  GLUCAP 88 105* 128* 253* 187*    Recent Results (from the past 240 hour(s))  CULTURE, BLOOD (ROUTINE X 2)     Status: None   Collection Time    10/11/12  3:00 AM      Result Value Range Status   Specimen Description BLOOD RIGHT WRIST   Final   Special Requests BOTTLES DRAWN AEROBIC ONLY 10CC   Final   Culture  Setup Time 10/11/2012 18:12   Final   Culture NO GROWTH 5 DAYS   Final   Report Status 10/17/2012 FINAL   Final  CULTURE, BLOOD (ROUTINE X 2)     Status: None   Collection Time    10/11/12  3:15 AM      Result Value Range Status   Specimen Description BLOOD RIGHT ARM    Final   Special Requests BOTTLES DRAWN AEROBIC ONLY 6CC    Final   Culture  Setup Time 10/11/2012 18:13   Final   Culture NO GROWTH 5 DAYS   Final   Report Status 10/17/2012 FINAL   Final  WOUND CULTURE     Status: None   Collection Time    10/11/12 10:32 AM      Result Value Range Status   Specimen Description WOUND LEFT ARM   Final   Special Requests SUPERFICIAL   Final   Gram Stain     Final   Value: RARE WBC PRESENT,BOTH PMN AND MONONUCLEAR     NO SQUAMOUS EPITHELIAL CELLS SEEN     FEW GRAM POSITIVE COCCI     IN PAIRS IN CLUSTERS   Culture     Final   Value: FEW STAPHYLOCOCCUS SPECIES (COAGULASE NEGATIVE)     Note: RIFAMPIN AND GENTAMICIN SHOULD NOT BE USED AS SINGLE DRUGS FOR TREATMENT OF STAPH INFECTIONS.   Report Status 10/15/2012 FINAL   Final   Organism ID, Bacteria STAPHYLOCOCCUS SPECIES (COAGULASE NEGATIVE)   Final  AFB CULTURE WITH SMEAR     Status: None   Collection Time    10/11/12 10:32 AM      Result Value Range Status   Specimen Description WOUND LEFT ARM   Final   Special Requests SUPERFICIAL   Final   ACID FAST SMEAR NO ACID FAST BACILLI SEEN   Final   Culture     Final   Value: CULTURE WILL BE EXAMINED FOR 6 WEEKS BEFORE ISSUING A FINAL REPORT   Report Status PENDING   Incomplete  ANAEROBIC CULTURE     Status: None   Collection Time    10/11/12 10:32 AM      Result Value Range Status   Specimen Description WOUND LEFT ARM   Final   Special Requests SUPERFICIAL   Final   Gram Stain     Final   Value: RARE WBC PRESENT,BOTH PMN AND MONONUCLEAR     NO SQUAMOUS EPITHELIAL CELLS SEEN     FEW GRAM POSITIVE COCCI     IN PAIRS IN CLUSTERS   Culture NO ANAEROBES ISOLATED   Final   Report Status 10/16/2012 FINAL   Final  WOUND CULTURE  Status: None   Collection Time    10/11/12 10:32 AM      Result Value Range Status   Specimen Description WOUND LEFT ARM   Final   Special Requests DEEP   Final   Gram Stain     Final   Value: RARE WBC PRESENT,BOTH PMN AND  MONONUCLEAR     RARE SQUAMOUS EPITHELIAL CELLS PRESENT     FEW GRAM POSITIVE COCCI     IN PAIRS IN CLUSTERS FEW GRAM NEGATIVE RODS   Culture     Final   Value: MULTIPLE ORGANISMS PRESENT, NONE PREDOMINANT     Note: NO STAPHYLOCOCCUS AUREUS ISOLATED NO GROUP A STREP (S.PYOGENES) ISOLATED   Report Status 10/13/2012 FINAL   Final  AFB CULTURE WITH SMEAR     Status: None   Collection Time    10/11/12 10:32 AM      Result Value Range Status   Specimen Description WOUND LEFT ARM   Final   Special Requests DEEP   Final   ACID FAST SMEAR NO ACID FAST BACILLI SEEN   Final   Culture     Final   Value: CULTURE WILL BE EXAMINED FOR 6 WEEKS BEFORE ISSUING A FINAL REPORT   Report Status PENDING   Incomplete  ANAEROBIC CULTURE     Status: None   Collection Time    10/11/12 10:32 AM      Result Value Range Status   Specimen Description WOUND LEFT ARM   Final   Special Requests DEEP   Final   Gram Stain     Final   Value: RARE WBC PRESENT,BOTH PMN AND MONONUCLEAR     RARE SQUAMOUS EPITHELIAL CELLS PRESENT     FEW GRAM POSITIVE COCCI     IN PAIRS IN CLUSTERS FEW GRAM NEGATIVE RODS   Culture NO ANAEROBES ISOLATED   Final   Report Status 10/16/2012 FINAL   Final     Studies: No results found.  Scheduled Meds: . ampicillin-sulbactam (UNASYN) IV  3 g Intravenous Q8H  . docusate sodium  100 mg Oral BID  . heparin  5,000 Units Subcutaneous Q8H  . HYDROmorphone PCA 0.3 mg/mL   Intravenous Q4H  . insulin aspart  0-5 Units Subcutaneous QHS  . insulin aspart  0-9 Units Subcutaneous TID WC  . nicotine  14 mg Transdermal Daily  . pantoprazole  40 mg Oral Daily   Continuous Infusions: . sodium chloride 20 mL/hr (10/16/12 1640)    Principal Problem:   Abscess Active Problems:   Drug abuse, IV   DM type 2, uncontrolled, with renal complications   HTN (hypertension)   Chronic kidney disease, stage 4, severely decreased GFR   Leukocytosis    Time spent: 20 min    [facility] Pager [phone] If 7PM-7AM, please contact night-coverage at [email or web address], password [email or web address] 10/17/2012, 10:48 AM  LOS: 4 days

## 2012-10-18 DIAGNOSIS — N183 Chronic kidney disease, stage 3 unspecified: Secondary | ICD-10-CM

## 2012-10-18 DIAGNOSIS — D631 Anemia in chronic kidney disease: Secondary | ICD-10-CM | POA: Diagnosis present

## 2012-10-18 DIAGNOSIS — N189 Chronic kidney disease, unspecified: Secondary | ICD-10-CM

## 2012-10-18 LAB — BASIC METABOLIC PANEL
BUN: 13 mg/dL (ref 6–23)
Creatinine, Ser: 1.78 mg/dL — ABNORMAL HIGH (ref 0.50–1.35)
GFR calc Af Amer: 57 mL/min — ABNORMAL LOW (ref >90)
GFR calc non Af Amer: 49 mL/min — ABNORMAL LOW (ref >90)
Glucose, Bld: 189 mg/dL — ABNORMAL HIGH (ref 70–99)
Potassium: 3.4 mEq/L — ABNORMAL LOW (ref 3.5–5.1)

## 2012-10-18 LAB — GLUCOSE, CAPILLARY: Glucose-Capillary: 182 mg/dL — ABNORMAL HIGH (ref 70–99)

## 2012-10-18 MED ORDER — LEVOFLOXACIN IN D5W 500 MG/100ML IV SOLN
500.0000 mg | INTRAVENOUS | Status: DC
  Administered 2012-10-18 – 2012-10-23 (×5): 500 mg via INTRAVENOUS
  Filled 2012-10-18 (×6): qty 100

## 2012-10-18 MED ORDER — INSULIN ASPART 100 UNIT/ML ~~LOC~~ SOLN
0.0000 [IU] | SUBCUTANEOUS | Status: DC
  Administered 2012-10-18: 3 [IU] via SUBCUTANEOUS
  Administered 2012-10-19: 5 [IU] via SUBCUTANEOUS
  Administered 2012-10-19: 7 [IU] via SUBCUTANEOUS
  Administered 2012-10-19: 3 [IU] via SUBCUTANEOUS
  Administered 2012-10-20: 11 [IU] via SUBCUTANEOUS
  Administered 2012-10-20 (×2): 20 [IU] via SUBCUTANEOUS
  Administered 2012-10-21: 11 [IU] via SUBCUTANEOUS
  Administered 2012-10-21 (×4): 7 [IU] via SUBCUTANEOUS
  Administered 2012-10-22: 15 [IU] via SUBCUTANEOUS
  Administered 2012-10-22: 7 [IU] via SUBCUTANEOUS
  Administered 2012-10-22: 3 [IU] via SUBCUTANEOUS
  Administered 2012-10-23: 11 [IU] via SUBCUTANEOUS
  Administered 2012-10-23: 4 [IU] via SUBCUTANEOUS

## 2012-10-18 MED ORDER — INSULIN ASPART 100 UNIT/ML ~~LOC~~ SOLN: 0.0000 [IU] | Freq: Three times a day (TID) | SUBCUTANEOUS | Status: DC

## 2012-10-18 MED ORDER — INSULIN ASPART 100 UNIT/ML ~~LOC~~ SOLN: 0.0000 [IU] | Freq: Every day | SUBCUTANEOUS | Status: DC

## 2012-10-18 MED ORDER — INSULIN ASPART 100 UNIT/ML ~~LOC~~ SOLN
20.0000 [IU] | Freq: Once | SUBCUTANEOUS | Status: AC
  Administered 2012-10-18: 20 [IU] via SUBCUTANEOUS

## 2012-10-18 MED ORDER — INSULIN ASPART 100 UNIT/ML ~~LOC~~ SOLN
14.0000 [IU] | Freq: Once | SUBCUTANEOUS | Status: AC
  Administered 2012-10-18: 14 [IU] via SUBCUTANEOUS

## 2012-10-18 MED ORDER — INSULIN DETEMIR 100 UNIT/ML ~~LOC~~ SOLN
40.0000 [IU] | Freq: Every day | SUBCUTANEOUS | Status: DC
  Administered 2012-10-18 – 2012-10-19 (×2): 40 [IU] via SUBCUTANEOUS
  Filled 2012-10-18 (×3): qty 0.4

## 2012-10-18 NOTE — Progress Notes (Addendum)
[facility] PROGRESS NOTE  Luke [name] [id number] DOB: April 24, 1980 DOA: 10/13/2012 PCP: Default, Provider, MD  Assessment/Plan: Principal Problem:   Abscess: Status post treatment. Continue IV antibiotics. Repeat  I & D later yesterday.  Since that overall better control with Dilaudid PCA. next debridement scheduled for tomorrow  Active Problems: Anemia: Continue to monitor. If falls below 7, was transfused. 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 Slight improvement    Leukocytosis-improving with debridement. Continue IV antibiotics, next debridement for tomorrow  IV access: Given renal dysfunction, reluctant to use PICC line. We'll discuss with critical care about central line   Code Status: Full  Family Communication: Plan discussed with family at the bedside  Disposition Plan: Home eventually    Consultants:  Dr. Carollee Leitz surgery  Procedures:  Status post irrigation and debridement of left forearm on 7/29, repeat on 8/1  Antibiotics:  IV Unasyn day 5  HPI/Subjective: Some pain in left upper extremity-  Objective: Filed Vitals:   10/17/12 2120 10/18/12 0201 10/18/12 0402 10/18/12 0624  BP: 153/79 161/86  138/80  Pulse: 98 102  96  Temp: 98.8 F (37.1 C) 98.9 F (37.2 C)  98.8 F (37.1 C)  TempSrc: Oral Oral  Oral  Resp: 16 16 18 18   Height:      Weight:      SpO2: 97% 98% 99% 97%    Intake/Output Summary (Last 24 hours) at 10/18/12 0726 Last data filed at 10/18/12 [id number]  Gross per 24 hour  Intake   1460 ml  Output    400 ml  Net   1060 ml   Filed Weights   10/14/12 0900  Weight: 86.2 kg (190 lb 0.6 oz)    Exam:   General:  Mild distress secondary to  pain, alert and oriented x3  Cardiovascular: Regular rate and rhythm, S1-S2  Respiratory: Clear to auscultation bilaterally  Abdomen: Soft, NT, ND +BS  Musculoskeletal: Left UE wrapped, tender  Data Reviewed: Basic Metabolic Panel:  Recent Labs Lab 10/12/12 0951  10/13/12 0815 10/14/12 0032 10/14/12 0820 10/15/12 1140 10/17/12 0533  NA 130*  --  133* 137 137 133* 144  K 3.7  --  4.0 3.7 3.4* 3.8 3.5  CL 95*  --  99 103 103 98 108  CO2 22  --  24  --  24 24 25   GLUCOSE 309*  < > 483*  481* 300* 245* 454* 213*  BUN 15  --  18 16 16 12 7   CREATININE 1.71*  --  2.64* 2.60* 2.47* 2.21* 1.89*  CALCIUM 8.6  --  7.9*  --  8.5 8.7 8.3*  < > = values in this interval not displayed. Liver Function Tests:  Recent Labs Lab 10/14/12 0820  AST 12  ALT 8  ALKPHOS 136*  BILITOT 0.2*  PROT 5.4*  ALBUMIN 2.0*   CBC:  Recent Labs Lab 10/13/12 0815 10/14/12 0027 10/14/12 0032 10/14/12 0820 10/16/12 1444 10/17/12 0533  WBC 21.9* 18.5*  --  16.4* 13.7* 15.7*  NEUTROABS  --   --   --  10.8*  --   --   HGB 8.2* 8.6* 8.8* 8.0* 7.9* 7.2*  HCT 24.3* 25.5* 26.0* 22.4* 22.4* 20.5*  MCV 84.1 83.6  --  82.7 81.5 82.0  PLT 354 415*  --  438* 621* 643*   CBG:  Recent Labs Lab 10/17/12 1105 10/17/12 1633 10/17/12 2117 10/18/12 0425 10/18/12 0619  GLUCAP 170* 332* 305* 407* 358*    Recent Results (from the past 240 hour(s))  CULTURE, BLOOD (ROUTINE X 2)     Status: None   Collection Time    10/11/12  3:00 AM      Result Value Range Status   Specimen Description BLOOD RIGHT WRIST   Final   Special Requests BOTTLES DRAWN AEROBIC ONLY 10CC   Final   Culture  Setup Time 10/11/2012 18:12   Final   Culture NO GROWTH 5 DAYS   Final   Report Status 10/17/2012 FINAL   Final  CULTURE, BLOOD (ROUTINE X 2)     Status: None   Collection Time    10/11/12  3:15 AM      Result Value Range Status   Specimen Description BLOOD RIGHT ARM   Final   Special Requests BOTTLES DRAWN AEROBIC  ONLY 6CC    Final   Culture  Setup Time 10/11/2012 18:13   Final   Culture NO GROWTH 5 DAYS   Final   Report Status 10/17/2012 FINAL   Final  WOUND CULTURE     Status: None   Collection Time    10/11/12 10:32 AM      Result Value Range Status   Specimen Description WOUND LEFT ARM   Final   Special Requests SUPERFICIAL   Final   Gram Stain     Final   Value: RARE WBC PRESENT,BOTH PMN AND MONONUCLEAR     NO SQUAMOUS EPITHELIAL CELLS SEEN     FEW GRAM POSITIVE COCCI     IN PAIRS IN CLUSTERS   Culture     Final   Value: FEW STAPHYLOCOCCUS SPECIES (COAGULASE NEGATIVE)     Note: RIFAMPIN AND GENTAMICIN SHOULD NOT BE USED AS SINGLE DRUGS FOR TREATMENT OF STAPH INFECTIONS.   Report Status 10/15/2012 FINAL   Final   Organism ID, Bacteria STAPHYLOCOCCUS SPECIES (COAGULASE NEGATIVE)   Final  AFB CULTURE WITH SMEAR     Status: None   Collection Time    10/11/12 10:32 AM      Result Value Range Status   Specimen Description WOUND LEFT ARM   Final   Special Requests SUPERFICIAL   Final   ACID FAST SMEAR NO ACID FAST BACILLI SEEN   Final   Culture     Final   Value: CULTURE WILL BE EXAMINED FOR 6 WEEKS BEFORE ISSUING A FINAL REPORT   Report Status PENDING   Incomplete  ANAEROBIC CULTURE     Status: None   Collection Time    10/11/12 10:32 AM      Result Value Range Status   Specimen Description WOUND LEFT ARM   Final   Special Requests SUPERFICIAL   Final   Gram Stain     Final   Value: RARE WBC PRESENT,BOTH PMN AND MONONUCLEAR     NO SQUAMOUS EPITHELIAL CELLS SEEN     FEW GRAM POSITIVE COCCI     IN PAIRS IN CLUSTERS   Culture NO ANAEROBES ISOLATED   Final   Report Status 10/16/2012 FINAL   Final  WOUND CULTURE     Status: None   Collection Time    10/11/12 10:32 AM      Result Value Range Status   Specimen Description WOUND LEFT ARM   Final  Special Requests DEEP   Final   Gram Stain     Final   Value: RARE WBC PRESENT,BOTH PMN AND MONONUCLEAR     RARE SQUAMOUS EPITHELIAL CELLS  PRESENT     FEW GRAM POSITIVE COCCI     IN PAIRS IN CLUSTERS FEW GRAM NEGATIVE RODS   Culture     Final   Value: MULTIPLE ORGANISMS PRESENT, NONE PREDOMINANT     Note: NO STAPHYLOCOCCUS AUREUS ISOLATED NO GROUP A STREP (S.PYOGENES) ISOLATED   Report Status 10/13/2012 FINAL   Final  AFB CULTURE WITH SMEAR     Status: None   Collection Time    10/11/12 10:32 AM      Result Value Range Status   Specimen Description WOUND LEFT ARM   Final   Special Requests DEEP   Final   ACID FAST SMEAR NO ACID FAST BACILLI SEEN   Final   Culture     Final   Value: CULTURE WILL BE EXAMINED FOR 6 WEEKS BEFORE ISSUING A FINAL REPORT   Report Status PENDING   Incomplete  ANAEROBIC CULTURE     Status: None   Collection Time    10/11/12 10:32 AM      Result Value Range Status   Specimen Description WOUND LEFT ARM   Final   Special Requests DEEP   Final   Gram Stain     Final   Value: RARE WBC PRESENT,BOTH PMN AND MONONUCLEAR     RARE SQUAMOUS EPITHELIAL CELLS PRESENT     FEW GRAM POSITIVE COCCI     IN PAIRS IN CLUSTERS FEW GRAM NEGATIVE RODS   Culture NO ANAEROBES ISOLATED   Final   Report Status 10/16/2012 FINAL   Final     Studies: No results found.  Scheduled Meds: . ampicillin-sulbactam (UNASYN) IV  3 g Intravenous Q6H  . docusate sodium  100 mg Oral BID  . heparin  5,000 Units Subcutaneous Q8H  . HYDROmorphone PCA 0.3 mg/mL   Intravenous Q4H  . insulin aspart  0-15 Units Subcutaneous TID WC  . insulin aspart  0-5 Units Subcutaneous QHS  . insulin detemir  40 Units Subcutaneous Daily  . LORazepam  0.25 mg Intravenous TID  . nicotine  14 mg Transdermal Daily  . pantoprazole  40 mg Oral Daily   Continuous Infusions: . sodium chloride 20 mL/hr (10/16/12 1640)    Principal Problem:   Abscess Active Problems:   Drug abuse, IV   DM type 2, uncontrolled, with renal complications   HTN (hypertension)   Chronic kidney disease, stage 4, severely decreased GFR   Leukocytosis    Time  spent: 20 min    [place] Pager [phone] If 7PM-7AM, please contact night-coverage at [email or web address], password [facility] 10/18/2012, 7:26 AM  LOS: 5 days

## 2012-10-18 NOTE — Progress Notes (Signed)
ANTIBIOTIC CONSULT NOTE - INITIAL  Pharmacy Consult for levaquin Indication: cellulitis/abscess  No Known Allergies  Patient Measurements: Height: 5' 8.9" (175 cm) Weight: 190 lb 0.6 oz (86.2 kg) IBW/kg (Calculated) : 70.47  Vital Signs: Temp: 98.8 F (37.1 C) (08/02 0624) Temp src: Oral (08/02 0624) BP: 138/80 mmHg (08/02 0624) Pulse Rate: 96 (08/02 0624) Intake/Output from previous day: 08/01 0701 - 08/02 0700 In: 1460 [P.O.:960; I.V.:500] Out: 400 [Urine:400] Intake/Output from this shift: Total I/O In: 120 [P.O.:120] Out: -   Labs:  Recent Labs  10/16/12 1444 10/17/12 0533  WBC 13.7* 15.7*  HGB 7.9* 7.2*  PLT 621* 643*  CREATININE  --  1.89*   Estimated Creatinine Clearance: 61 ml/min (by C-G formula based on Cr of 1.89). No results found for this basename: VANCOTROUGH, VANCOPEAK, VANCORANDOM, GENTTROUGH, GENTPEAK, GENTRANDOM, TOBRATROUGH, TOBRAPEAK, TOBRARND, AMIKACINPEAK, AMIKACINTROU, AMIKACIN,  in the last 72 hours   Microbiology: Recent Results (from the past 720 hour(s))  CULTURE, BLOOD (ROUTINE X 2)     Status: None   Collection Time    10/11/12  3:00 AM      Result Value Range Status   Specimen Description BLOOD RIGHT WRIST   Final   Special Requests BOTTLES DRAWN AEROBIC ONLY 10CC   Final   Culture  Setup Time 10/11/2012 18:12   Final   Culture NO GROWTH 5 DAYS   Final   Report Status 10/17/2012 FINAL   Final  CULTURE, BLOOD (ROUTINE X 2)     Status: None   Collection Time    10/11/12  3:15 AM      Result Value Range Status   Specimen Description BLOOD RIGHT ARM   Final   Special Requests BOTTLES DRAWN AEROBIC ONLY 6CC    Final   Culture  Setup Time 10/11/2012 18:13   Final   Culture NO GROWTH 5 DAYS   Final   Report Status 10/17/2012 FINAL   Final  WOUND CULTURE     Status: None   Collection Time    10/11/12 10:32 AM      Result Value Range Status   Specimen Description WOUND LEFT ARM   Final   Special Requests SUPERFICIAL   Final   Gram Stain     Final   Value: RARE WBC PRESENT,BOTH PMN AND MONONUCLEAR     NO SQUAMOUS EPITHELIAL CELLS SEEN     FEW GRAM POSITIVE COCCI     IN PAIRS IN CLUSTERS   Culture     Final   Value: FEW STAPHYLOCOCCUS SPECIES (COAGULASE NEGATIVE)     Note: RIFAMPIN AND GENTAMICIN SHOULD NOT BE USED AS SINGLE DRUGS FOR TREATMENT OF STAPH INFECTIONS.   Report Status 10/15/2012 FINAL   Final   Organism ID, Bacteria STAPHYLOCOCCUS SPECIES (COAGULASE NEGATIVE)   Final  AFB CULTURE WITH SMEAR     Status: None   Collection Time    10/11/12 10:32 AM      Result Value Range Status   Specimen Description WOUND LEFT ARM   Final   Special Requests SUPERFICIAL   Final   ACID FAST SMEAR NO ACID FAST BACILLI SEEN   Final   Culture     Final   Value: CULTURE WILL BE EXAMINED FOR 6 WEEKS BEFORE ISSUING A FINAL REPORT   Report Status PENDING   Incomplete  ANAEROBIC CULTURE     Status: None   Collection Time    10/11/12 10:32 AM      Result Value Range Status  Specimen Description WOUND LEFT ARM   Final   Special Requests SUPERFICIAL   Final   Gram Stain     Final   Value: RARE WBC PRESENT,BOTH PMN AND MONONUCLEAR     NO SQUAMOUS EPITHELIAL CELLS SEEN     FEW GRAM POSITIVE COCCI     IN PAIRS IN CLUSTERS   Culture NO ANAEROBES ISOLATED   Final   Report Status 10/16/2012 FINAL   Final  WOUND CULTURE     Status: None   Collection Time    10/11/12 10:32 AM      Result Value Range Status   Specimen Description WOUND LEFT ARM   Final   Special Requests DEEP   Final   Gram Stain     Final   Value: RARE WBC PRESENT,BOTH PMN AND MONONUCLEAR     RARE SQUAMOUS EPITHELIAL CELLS PRESENT     FEW GRAM POSITIVE COCCI     IN PAIRS IN CLUSTERS FEW GRAM NEGATIVE RODS   Culture     Final   Value: MULTIPLE ORGANISMS PRESENT, NONE PREDOMINANT     Note: NO STAPHYLOCOCCUS AUREUS ISOLATED NO GROUP A STREP (S.PYOGENES) ISOLATED   Report Status 10/13/2012 FINAL   Final  AFB CULTURE WITH SMEAR     Status: None    Collection Time    10/11/12 10:32 AM      Result Value Range Status   Specimen Description WOUND LEFT ARM   Final   Special Requests DEEP   Final   ACID FAST SMEAR NO ACID FAST BACILLI SEEN   Final   Culture     Final   Value: CULTURE WILL BE EXAMINED FOR 6 WEEKS BEFORE ISSUING A FINAL REPORT   Report Status PENDING   Incomplete  ANAEROBIC CULTURE     Status: None   Collection Time    10/11/12 10:32 AM      Result Value Range Status   Specimen Description WOUND LEFT ARM   Final   Special Requests DEEP   Final   Gram Stain     Final   Value: RARE WBC PRESENT,BOTH PMN AND MONONUCLEAR     RARE SQUAMOUS EPITHELIAL CELLS PRESENT     FEW GRAM POSITIVE COCCI     IN PAIRS IN CLUSTERS FEW GRAM NEGATIVE RODS   Culture NO ANAEROBES ISOLATED   Final   Report Status 10/16/2012 FINAL   Final   Medications:  Anti-infectives   Start     Dose/Rate Route Frequency Ordered Stop   10/18/12 1300  levofloxacin (LEVAQUIN) IVPB 500 mg     500 mg 100 mL/hr over 60 Minutes Intravenous Every 24 hours 10/18/12 1144     10/17/12 1700  Ampicillin-Sulbactam (UNASYN) 3 g in sodium chloride 0.9 % 100 mL IVPB     3 g 100 mL/hr over 60 Minutes Intravenous Every 6 hours 10/17/12 1130     10/14/12 1800  Ampicillin-Sulbactam (UNASYN) 3 g in sodium chloride 0.9 % 100 mL IVPB  Status:  Discontinued     3 g 100 mL/hr over 60 Minutes Intravenous Every 8 hours 10/14/12 1047 10/17/12 1130   10/14/12 0800  ampicillin-sulbactam (UNASYN) 1.5 g in sodium chloride 0.9 % 50 mL IVPB  Status:  Discontinued     1.5 g 100 mL/hr over 30 Minutes Intravenous Every 8 hours 10/14/12 0701 10/14/12 1046   10/14/12 0015  ciprofloxacin (CIPRO) IVPB 400 mg     400 mg 200 mL/hr over 60 Minutes Intravenous  Once  10/14/12 0008 10/14/12 0147   10/14/12 0015  vancomycin (VANCOCIN) IVPB 1000 mg/200 mL premix     1,000 mg 200 mL/hr over 60 Minutes Intravenous  Once 10/14/12 0008 10/14/12 0250     Assessment: 30 yom initially started on  vancomycin + cipro for cellulitis/abscess then de-escalated to unasyn. Pt left AMA but shortly returned and unasyn was resumed. Now changing to levaquin monotherapy. Pts Scr is elevated but CrCl is ~37ml/min. Pt is afebrile and WBC is 15.7 as of 8/1.   Unasyn 7/27 (missed a few doses since left AMA but returned) >>8/2 Vanc 7/26>>7/28 Cipro 7/26>>7/27 Levaquin 8/2>>  7/26 Blood - NEG 7/26 Wound - few CNS  Goal of Therapy:  Eradication of infection  Plan:  1. Levaquin 500mg  IV Q24H - change to PO when appropriate 2. F/u renal fxn, C&S, clinical status and planned LOT  Denaisha Swango, Rande Lawman 10/18/2012,11:45 AM

## 2012-10-19 ENCOUNTER — Inpatient Hospital Stay (HOSPITAL_COMMUNITY): Payer: Medicaid Other

## 2012-10-19 LAB — CBC
HCT: 20.1 % — ABNORMAL LOW (ref 39.0–52.0)
MCH: 28.3 pg (ref 26.0–34.0)
MCHC: 34.8 g/dL (ref 30.0–36.0)
MCV: 81.4 fL (ref 78.0–100.0)
Platelets: 751 10*3/uL — ABNORMAL HIGH (ref 150–400)
RDW: 13.7 % (ref 11.5–15.5)

## 2012-10-19 LAB — GLUCOSE, CAPILLARY
Glucose-Capillary: 129 mg/dL — ABNORMAL HIGH (ref 70–99)
Glucose-Capillary: 207 mg/dL — ABNORMAL HIGH (ref 70–99)
Glucose-Capillary: 91 mg/dL (ref 70–99)

## 2012-10-19 LAB — PREPARE RBC (CROSSMATCH)

## 2012-10-19 MED ORDER — CHLORHEXIDINE GLUCONATE 4 % EX LIQD
60.0000 mL | Freq: Once | CUTANEOUS | Status: AC
  Administered 2012-10-20: 4 via TOPICAL
  Filled 2012-10-19 (×2): qty 60

## 2012-10-19 MED ORDER — POTASSIUM CHLORIDE CRYS ER 20 MEQ PO TBCR
40.0000 meq | EXTENDED_RELEASE_TABLET | Freq: Once | ORAL | Status: AC
  Administered 2012-10-19: 40 meq via ORAL
  Filled 2012-10-19: qty 2

## 2012-10-19 MED ORDER — DIPHENHYDRAMINE HCL 12.5 MG/5ML PO ELIX
12.5000 mg | ORAL_SOLUTION | Freq: Four times a day (QID) | ORAL | Status: DC | PRN
  Administered 2012-10-20: 12.5 mg via ORAL
  Filled 2012-10-19: qty 10

## 2012-10-19 MED ORDER — DIPHENHYDRAMINE HCL 50 MG/ML IJ SOLN
6.2500 mg | INTRAMUSCULAR | Status: DC | PRN
  Administered 2012-10-19 – 2012-10-23 (×12): 6.5 mg via INTRAVENOUS
  Filled 2012-10-19 (×11): qty 1

## 2012-10-19 MED ORDER — FUROSEMIDE 10 MG/ML IJ SOLN
20.0000 mg | Freq: Once | INTRAMUSCULAR | Status: AC
  Administered 2012-10-19: 20 mg via INTRAVENOUS
  Filled 2012-10-19: qty 2

## 2012-10-19 MED ORDER — DIPHENHYDRAMINE HCL 50 MG/ML IJ SOLN
INTRAMUSCULAR | Status: AC
  Filled 2012-10-19: qty 1

## 2012-10-19 NOTE — Progress Notes (Signed)
MD notes and patient state that he may have surgery on 10/19/12. Pt agreed to be NPO in case he does have surgery. CBG's continue to be monitored q 4 hours with insulin coverage.

## 2012-10-19 NOTE — Progress Notes (Signed)
PT SEEN/EXAMINED HAND LOOKS BETTER TODAY WIGGLES FINGERS APPEARS MORE COMFORTABLE WILL PLAN FOR DRESSING CHANGE IN OR AND REPEAT I/D IN OR TOMORROW PM. OK TO FEED NOW AT BEDSIDE WITH DR. [place] Pink TO GO OVER PLAN HOPE TO CLOSE WOUNDS TOMORROW IF BETTER.

## 2012-10-19 NOTE — Progress Notes (Signed)
[facility] PROGRESS NOTE  Luke [name] [id number] DOB: 03-Dec-1980 DOA: 10/13/2012 PCP: Default, Provider, MD  Assessment/Plan: Principal Problem:   Abscess: Status post treatment. Continue IV antibiotics. Repeat  I & D later yesterday.  Since that overall better control with Dilaudid PCA. next debridement scheduled for tomorrow  Active Problems: Anemia: Likely chronic disease from renal dysfunction. 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 Slight improvement    Leukocytosis-improving with debridement. Continue IV antibiotics, next debridement for tomorrow  IV access: Given renal dysfunction, reluctant to use PICC line. Critical care for central line placement today   Code Status: Full  Family Communication: Spoke with family at the bedside yesterday  Disposition Plan: Home eventually    Consultants:  Dr. Carollee Leitz surgery  Procedures:  Status post irrigation and debridement of left forearm on 7/29, repeat on 8/1  Antibiotics:  IV Unasyn day 6   HPI/Subjective: Some pain in left upper extremity,tolerable.   Objective: Filed Vitals:   10/18/12 2355 10/19/12 0400 10/19/12 0609 10/19/12 0840  BP:   139/79   Pulse:   78   Temp:   98.2 F (36.8 C)   TempSrc:   Oral   Resp: 16 16 19 18   Height:      Weight:      SpO2: 98% 100% 100% 100%    Intake/Output Summary (Last 24 hours) at 10/19/12 1138 Last data filed at 10/19/12 0835  Gross per 24 hour  Intake   1226 ml  Output    950 ml  Net    276 ml   Filed Weights   10/14/12 0900  Weight: 86.2 kg (190 lb 0.6 oz)    Exam:   General:  Mild distress  secondary to pain, alert and oriented x3, overall feeling better  Cardiovascular: Regular rate and rhythm, S1-S2  Respiratory: Clear to auscultation bilaterally  Abdomen: Soft, NT, ND +BS  Musculoskeletal: Left UE wrapped, tender  Data Reviewed: Basic Metabolic Panel:  Recent Labs Lab 10/13/12 0815 10/14/12 0032 10/14/12 0820 10/15/12 1140 10/17/12 0533 10/18/12 1946  NA 133* 137 137 133* 144 139  K 4.0 3.7 3.4* 3.8 3.5 3.4*  CL 99 103 103 98 108 101  CO2 24  --  24 24 25 26   GLUCOSE 483*  481* 300* 245* 454* 213* 189*  BUN 18 16 16 12 7 13   CREATININE 2.64* 2.60* 2.47* 2.21* 1.89* 1.78*  CALCIUM 7.9*  --  8.5 8.7 8.3* 8.7   Liver Function Tests:  Recent Labs Lab 10/14/12 0820  AST 12  ALT 8  ALKPHOS 136*  BILITOT 0.2*  PROT 5.4*  ALBUMIN 2.0*   CBC:  Recent Labs Lab 10/14/12 0027 10/14/12 0032 10/14/12 0820 10/16/12 1444 10/17/12 0533 10/19/12 0458  WBC 18.5*  --  16.4* 13.7* 15.7* 12.7*  NEUTROABS  --   --  10.8*  --   --   --   HGB 8.6* 8.8* 8.0* 7.9* 7.2* 7.0*  HCT 25.5* 26.0* 22.4* 22.4* 20.5* 20.1*  MCV 83.6  --  82.7 81.5 82.0 81.4  PLT 415*  --  438* 621* 643* 751*   CBG:  Recent Labs Lab  10/18/12 1819 10/18/12 1941 10/19/12 0002 10/19/12 0432 10/19/12 0833  GLUCAP 424* 187* 91 207* 106*    Recent Results (from the past 240 hour(s))  CULTURE, BLOOD (ROUTINE X 2)     Status: None   Collection Time    10/11/12  3:00 AM      Result Value Range Status   Specimen Description BLOOD RIGHT WRIST   Final   Special Requests BOTTLES DRAWN AEROBIC ONLY 10CC   Final   Culture  Setup Time 10/11/2012 18:12   Final   Culture NO GROWTH 5 DAYS   Final   Report Status 10/17/2012 FINAL   Final  CULTURE, BLOOD (ROUTINE X 2)     Status: None   Collection Time    10/11/12  3:15 AM      Result Value Range Status   Specimen Description BLOOD RIGHT ARM   Final   Special Requests BOTTLES DRAWN AEROBIC ONLY 6CC    Final   Culture  Setup Time  10/11/2012 18:13   Final   Culture NO GROWTH 5 DAYS   Final   Report Status 10/17/2012 FINAL   Final  WOUND CULTURE     Status: None   Collection Time    10/11/12 10:32 AM      Result Value Range Status   Specimen Description WOUND LEFT ARM   Final   Special Requests SUPERFICIAL   Final   Gram Stain     Final   Value: RARE WBC PRESENT,BOTH PMN AND MONONUCLEAR     NO SQUAMOUS EPITHELIAL CELLS SEEN     FEW GRAM POSITIVE COCCI     IN PAIRS IN CLUSTERS   Culture     Final   Value: FEW STAPHYLOCOCCUS SPECIES (COAGULASE NEGATIVE)     Note: RIFAMPIN AND GENTAMICIN SHOULD NOT BE USED AS SINGLE DRUGS FOR TREATMENT OF STAPH INFECTIONS.   Report Status 10/15/2012 FINAL   Final   Organism ID, Bacteria STAPHYLOCOCCUS SPECIES (COAGULASE NEGATIVE)   Final  AFB CULTURE WITH SMEAR     Status: None   Collection Time    10/11/12 10:32 AM      Result Value Range Status   Specimen Description WOUND LEFT ARM   Final   Special Requests SUPERFICIAL   Final   ACID FAST SMEAR NO ACID FAST BACILLI SEEN   Final   Culture     Final   Value: CULTURE WILL BE EXAMINED FOR 6 WEEKS BEFORE ISSUING A FINAL REPORT   Report Status PENDING   Incomplete  ANAEROBIC CULTURE     Status: None   Collection Time    10/11/12 10:32 AM      Result Value Range Status   Specimen Description WOUND LEFT ARM   Final   Special Requests SUPERFICIAL   Final   Gram Stain     Final   Value: RARE WBC PRESENT,BOTH PMN AND MONONUCLEAR     NO SQUAMOUS EPITHELIAL CELLS SEEN     FEW GRAM POSITIVE COCCI     IN PAIRS IN CLUSTERS   Culture NO ANAEROBES ISOLATED   Final   Report Status 10/16/2012 FINAL   Final  WOUND CULTURE     Status: None   Collection Time    10/11/12 10:32 AM      Result Value Range Status   Specimen Description WOUND LEFT ARM   Final   Special Requests DEEP   Final   Gram Stain     Final   Value:  RARE WBC PRESENT,BOTH PMN AND MONONUCLEAR     RARE SQUAMOUS EPITHELIAL CELLS PRESENT     FEW GRAM POSITIVE COCCI      IN PAIRS IN CLUSTERS FEW GRAM NEGATIVE RODS   Culture     Final   Value: MULTIPLE ORGANISMS PRESENT, NONE PREDOMINANT     Note: NO STAPHYLOCOCCUS AUREUS ISOLATED NO GROUP A STREP (S.PYOGENES) ISOLATED   Report Status 10/13/2012 FINAL   Final  AFB CULTURE WITH SMEAR     Status: None   Collection Time    10/11/12 10:32 AM      Result Value Range Status   Specimen Description WOUND LEFT ARM   Final   Special Requests DEEP   Final   ACID FAST SMEAR NO ACID FAST BACILLI SEEN   Final   Culture     Final   Value: CULTURE WILL BE EXAMINED FOR 6 WEEKS BEFORE ISSUING A FINAL REPORT   Report Status PENDING   Incomplete  ANAEROBIC CULTURE     Status: None   Collection Time    10/11/12 10:32 AM      Result Value Range Status   Specimen Description WOUND LEFT ARM   Final   Special Requests DEEP   Final   Gram Stain     Final   Value: RARE WBC PRESENT,BOTH PMN AND MONONUCLEAR     RARE SQUAMOUS EPITHELIAL CELLS PRESENT     FEW GRAM POSITIVE COCCI     IN PAIRS IN CLUSTERS FEW GRAM NEGATIVE RODS   Culture NO ANAEROBES ISOLATED   Final   Report Status 10/16/2012 FINAL   Final     Studies: No results found.  Scheduled Meds: . ampicillin-sulbactam (UNASYN) IV  3 g Intravenous Q6H  . docusate sodium  100 mg Oral BID  . furosemide  20 mg Intravenous Once  . furosemide  20 mg Intravenous Once  . heparin  5,000 Units Subcutaneous Q8H  . HYDROmorphone PCA 0.3 mg/mL   Intravenous Q4H  . insulin aspart  0-20 Units Subcutaneous Q4H  . insulin detemir  40 Units Subcutaneous Daily  . levofloxacin (LEVAQUIN) IV  500 mg Intravenous Q24H  . LORazepam  0.25 mg Intravenous TID  . nicotine  14 mg Transdermal Daily  . pantoprazole  40 mg Oral Daily   Continuous Infusions: . sodium chloride 20 mL (10/19/12 0655)    Principal Problem:   Abscess Active Problems:   Drug abuse, IV   DM type 2, uncontrolled, with renal complications   HTN (hypertension)   Chronic kidney disease, stage 3    Leukocytosis   Anemia in chronic kidney disease    Time spent: 20 min    [facility] Pager [phone] If 7PM-7AM, please contact night-coverage at [email or web address], password [facility] 10/19/2012, 11:38 AM  LOS: 6 days

## 2012-10-19 NOTE — Procedures (Signed)
Central Venous Catheter Insertion Procedure Note TAKARI RAVENS [id number] May 10, 1980  Procedure: Insertion of Central Venous Catheter Indications: Drug and/or fluid administration and Frequent blood sampling Real time [place] was used to ID and cannulate the vessel  Procedure Details Consent: Risks of procedure as well as the alternatives and risks of each were explained to the (patient/caregiver).  Consent for procedure obtained. Time Out: Verified patient identification, verified procedure, site/side was marked, verified correct patient position, special equipment/implants available, medications/allergies/relevent history reviewed, required imaging and test results available.  Performed  Maximum sterile technique was used including antiseptics, cap, gloves, gown, hand hygiene, mask and sheet. Skin prep: Chlorhexidine; local anesthetic administered A antimicrobial bonded/coated triple lumen catheter was placed in the right internal jugular vein using the Seldinger technique.  Evaluation Blood flow good Complications: No apparent complications Patient did tolerate procedure well. Chest X-ray ordered to verify placement.  CXR: pending.  BABCOCK,PETE 10/19/2012, 3:40 PM  Chesley Mires, MD [facility] 10/19/2012, 3:48 PM Pager:  [phone] After 3pm call: [phone]

## 2012-10-20 ENCOUNTER — Encounter (HOSPITAL_COMMUNITY): Payer: Self-pay | Admitting: Anesthesiology

## 2012-10-20 ENCOUNTER — Inpatient Hospital Stay (HOSPITAL_COMMUNITY): Payer: Medicaid Other | Admitting: Anesthesiology

## 2012-10-20 ENCOUNTER — Encounter (HOSPITAL_COMMUNITY)
Admission: EM | Disposition: A | Discharge status: Home / Self Care | Payer: Self-pay | Source: Home / Self Care | Attending: Internal Medicine

## 2012-10-20 HISTORY — PX: I & D EXTREMITY: SHX5045

## 2012-10-20 LAB — GLUCOSE, CAPILLARY
Glucose-Capillary: 206 mg/dL — ABNORMAL HIGH (ref 70–99)
Glucose-Capillary: 294 mg/dL — ABNORMAL HIGH (ref 70–99)
Glucose-Capillary: 76 mg/dL (ref 70–99)

## 2012-10-20 LAB — CBC
HCT: 28.5 % — ABNORMAL LOW (ref 39.0–52.0)
MCHC: 35.1 g/dL (ref 30.0–36.0)
Platelets: 755 10*3/uL — ABNORMAL HIGH (ref 150–400)
RDW: 13.6 % (ref 11.5–15.5)
WBC: 13.7 10*3/uL — ABNORMAL HIGH (ref 4.0–10.5)

## 2012-10-20 LAB — BASIC METABOLIC PANEL
BUN: 13 mg/dL (ref 6–23)
Chloride: 99 mEq/L (ref 96–112)
GFR calc Af Amer: 55 mL/min — ABNORMAL LOW (ref >90)
GFR calc non Af Amer: 48 mL/min — ABNORMAL LOW (ref >90)
Potassium: 3.6 mEq/L (ref 3.5–5.1)
Sodium: 140 mEq/L (ref 135–145)

## 2012-10-20 LAB — TYPE AND SCREEN
Antibody Screen: NEGATIVE
Unit division: 0

## 2012-10-20 LAB — SURGICAL PCR SCREEN: Staphylococcus aureus: NEGATIVE

## 2012-10-20 SURGERY — IRRIGATION AND DEBRIDEMENT EXTREMITY
Anesthesia: General | Laterality: Left | Wound class: Dirty or Infected

## 2012-10-20 MED ORDER — LACTATED RINGERS IV SOLN
INTRAVENOUS | Status: DC
  Administered 2012-10-20: 09:00:00 via INTRAVENOUS

## 2012-10-20 MED ORDER — SODIUM CHLORIDE 0.9 % IJ SOLN: 10.0000 mL | INTRAMUSCULAR | Status: DC | PRN

## 2012-10-20 MED ORDER — HYDROMORPHONE HCL PF 1 MG/ML IJ SOLN
INTRAMUSCULAR | Status: AC
  Filled 2012-10-20: qty 1

## 2012-10-20 MED ORDER — HYDROMORPHONE HCL PF 1 MG/ML IJ SOLN
0.5000 mg | INTRAMUSCULAR | Status: DC | PRN
  Administered 2012-10-20 (×2): 0.5 mg via INTRAVENOUS

## 2012-10-20 MED ORDER — ALPRAZOLAM 0.5 MG PO TABS
1.0000 mg | ORAL_TABLET | Freq: Two times a day (BID) | ORAL | Status: DC | PRN
  Administered 2012-10-20 – 2012-10-23 (×6): 1 mg via ORAL
  Filled 2012-10-20 (×6): qty 2

## 2012-10-20 MED ORDER — KETOROLAC TROMETHAMINE 30 MG/ML IJ SOLN
INTRAMUSCULAR | Status: AC
  Filled 2012-10-20: qty 1

## 2012-10-20 MED ORDER — PROPOFOL 10 MG/ML IV BOLUS
INTRAVENOUS | Status: DC | PRN
  Administered 2012-10-20: 200 mg via INTRAVENOUS

## 2012-10-20 MED ORDER — LIDOCAINE HCL (CARDIAC) 20 MG/ML IV SOLN
INTRAVENOUS | Status: DC | PRN
  Administered 2012-10-20: 50 mg via INTRAVENOUS

## 2012-10-20 MED ORDER — HYDROMORPHONE HCL PF 1 MG/ML IJ SOLN
0.2500 mg | INTRAMUSCULAR | Status: DC | PRN
  Administered 2012-10-20 (×4): 0.5 mg via INTRAVENOUS

## 2012-10-20 MED ORDER — 0.9 % SODIUM CHLORIDE (POUR BTL) OPTIME
TOPICAL | Status: DC | PRN
  Administered 2012-10-20: 1000 mL

## 2012-10-20 MED ORDER — FENTANYL CITRATE 0.05 MG/ML IJ SOLN
INTRAMUSCULAR | Status: DC | PRN
  Administered 2012-10-20: 100 ug via INTRAVENOUS
  Administered 2012-10-20 (×8): 50 ug via INTRAVENOUS

## 2012-10-20 MED ORDER — OXYCODONE-ACETAMINOPHEN 5-325 MG PO TABS
ORAL_TABLET | ORAL | Status: AC
  Filled 2012-10-20: qty 1

## 2012-10-20 MED ORDER — SODIUM CHLORIDE 0.9 % IJ SOLN
INTRAMUSCULAR | Status: AC
  Filled 2012-10-20: qty 3

## 2012-10-20 MED ORDER — SODIUM CHLORIDE 0.9 % IR SOLN
Status: DC | PRN
  Administered 2012-10-20: 3000 mL

## 2012-10-20 MED ORDER — ONDANSETRON HCL 4 MG/2ML IJ SOLN
INTRAMUSCULAR | Status: DC | PRN
  Administered 2012-10-20: 4 mg via INTRAVENOUS

## 2012-10-20 MED ORDER — INSULIN DETEMIR 100 UNIT/ML ~~LOC~~ SOLN
45.0000 [IU] | Freq: Every day | SUBCUTANEOUS | Status: DC
  Administered 2012-10-21: 45 [IU] via SUBCUTANEOUS
  Filled 2012-10-20 (×2): qty 0.45

## 2012-10-20 MED ORDER — LACTATED RINGERS IV SOLN
INTRAVENOUS | Status: DC | PRN
  Administered 2012-10-20: 10:00:00 via INTRAVENOUS

## 2012-10-20 MED ORDER — MIDAZOLAM HCL 2 MG/2ML IJ SOLN: 0.5000 mg | Freq: Once | INTRAMUSCULAR | Status: DC | PRN

## 2012-10-20 MED ORDER — KETOROLAC TROMETHAMINE 30 MG/ML IJ SOLN
15.0000 mg | Freq: Once | INTRAMUSCULAR | Status: AC | PRN
  Administered 2012-10-20: 30 mg via INTRAVENOUS

## 2012-10-20 MED ORDER — ONDANSETRON HCL 4 MG/2ML IJ SOLN: 4.0000 mg | Freq: Once | INTRAMUSCULAR | Status: DC | PRN

## 2012-10-20 MED ORDER — OXYCODONE HCL 5 MG PO TABS: 5.0000 mg | ORAL_TABLET | Freq: Once | ORAL | Status: AC | PRN

## 2012-10-20 MED ORDER — HYDROMORPHONE HCL PF 1 MG/ML IJ SOLN
INTRAMUSCULAR | Status: AC
  Filled 2012-10-20: qty 2

## 2012-10-20 MED ORDER — OXYCODONE HCL 5 MG/5ML PO SOLN: 5.0000 mg | Freq: Once | ORAL | Status: AC | PRN

## 2012-10-20 MED ORDER — OXYCODONE HCL 5 MG PO TABS
ORAL_TABLET | ORAL | Status: AC
  Administered 2012-10-20: 5 mg
  Filled 2012-10-20: qty 1

## 2012-10-20 SURGICAL SUPPLY — 55 items
BANDAGE CONFORM 2  STR LF (GAUZE/BANDAGES/DRESSINGS) IMPLANT
BANDAGE ELASTIC 3 VELCRO ST LF (GAUZE/BANDAGES/DRESSINGS) ×2 IMPLANT
BANDAGE ELASTIC 4 VELCRO ST LF (GAUZE/BANDAGES/DRESSINGS) ×4 IMPLANT
BANDAGE GAUZE ELAST BULKY 4 IN (GAUZE/BANDAGES/DRESSINGS) ×4 IMPLANT
BNDG COHESIVE 1X5 TAN STRL LF (GAUZE/BANDAGES/DRESSINGS) IMPLANT
BNDG ESMARK 4X9 LF (GAUZE/BANDAGES/DRESSINGS) IMPLANT
CLOTH BEACON ORANGE TIMEOUT ST (SAFETY) ×2 IMPLANT
CORDS BIPOLAR (ELECTRODE) IMPLANT
COVER SURGICAL LIGHT HANDLE (MISCELLANEOUS) ×2 IMPLANT
CUFF TOURNIQUET SINGLE 18IN (TOURNIQUET CUFF) ×2 IMPLANT
CUFF TOURNIQUET SINGLE 24IN (TOURNIQUET CUFF) IMPLANT
DRAIN PENROSE 1/4X12 LTX STRL (WOUND CARE) IMPLANT
DRAPE SURG 17X23 STRL (DRAPES) ×2 IMPLANT
DRAPE X-RAY CASS 24X20 (DRAPES) ×2 IMPLANT
DRSG ADAPTIC 3X8 NADH LF (GAUZE/BANDAGES/DRESSINGS) ×4 IMPLANT
DRSG PAD ABDOMINAL 8X10 ST (GAUZE/BANDAGES/DRESSINGS) ×4 IMPLANT
ELECT REM PT RETURN 9FT ADLT (ELECTROSURGICAL)
ELECTRODE REM PT RTRN 9FT ADLT (ELECTROSURGICAL) IMPLANT
GAUZE XEROFORM 1X8 LF (GAUZE/BANDAGES/DRESSINGS) IMPLANT
GAUZE XEROFORM 5X9 LF (GAUZE/BANDAGES/DRESSINGS) IMPLANT
GLOVE BIOGEL PI IND STRL 8.5 (GLOVE) ×1 IMPLANT
GLOVE BIOGEL PI INDICATOR 8.5 (GLOVE) ×1
GLOVE SURG ORTHO 8.0 STRL STRW (GLOVE) ×2 IMPLANT
GOWN PREVENTION PLUS XLARGE (GOWN DISPOSABLE) ×2 IMPLANT
GOWN STRL NON-REIN LRG LVL3 (GOWN DISPOSABLE) ×6 IMPLANT
HANDPIECE INTERPULSE COAX TIP (DISPOSABLE) ×1
KIT BASIN OR (CUSTOM PROCEDURE TRAY) ×2 IMPLANT
KIT ROOM TURNOVER OR (KITS) ×2 IMPLANT
MANIFOLD NEPTUNE II (INSTRUMENTS) ×2 IMPLANT
NEEDLE HYPO 25GX1X1/2 BEV (NEEDLE) IMPLANT
NS IRRIG 1000ML POUR BTL (IV SOLUTION) ×2 IMPLANT
PACK ORTHO EXTREMITY (CUSTOM PROCEDURE TRAY) ×2 IMPLANT
PAD ARMBOARD 7.5X6 YLW CONV (MISCELLANEOUS) ×4 IMPLANT
PAD CAST 4YDX4 CTTN HI CHSV (CAST SUPPLIES) ×1 IMPLANT
PADDING CAST COTTON 4X4 STRL (CAST SUPPLIES) ×1
SET HNDPC FAN SPRY TIP SCT (DISPOSABLE) ×1 IMPLANT
SOAP 2 % CHG 4 OZ (WOUND CARE) ×4 IMPLANT
SPLINT FIBERGLASS 4X30 (CAST SUPPLIES) ×2 IMPLANT
SPONGE GAUZE 4X4 12PLY (GAUZE/BANDAGES/DRESSINGS) ×2 IMPLANT
SPONGE LAP 18X18 X RAY DECT (DISPOSABLE) ×4 IMPLANT
SPONGE LAP 4X18 X RAY DECT (DISPOSABLE) IMPLANT
SUCTION FRAZIER TIP 10 FR DISP (SUCTIONS) ×2 IMPLANT
SUT ETHILON 2 0 FS 18 (SUTURE) ×10 IMPLANT
SUT ETHILON 2 0 PSLX (SUTURE) ×8 IMPLANT
SUT ETHILON 4 0 PS 2 18 (SUTURE) IMPLANT
SUT ETHILON 5 0 P 3 18 (SUTURE)
SUT NYLON ETHILON 5-0 P-3 1X18 (SUTURE) IMPLANT
SYR CONTROL 10ML LL (SYRINGE) IMPLANT
TOWEL OR 17X24 6PK STRL BLUE (TOWEL DISPOSABLE) ×2 IMPLANT
TOWEL OR 17X26 10 PK STRL BLUE (TOWEL DISPOSABLE) ×2 IMPLANT
TUBE ANAEROBIC SPECIMEN COL (MISCELLANEOUS) IMPLANT
TUBE CONNECTING 12X1/4 (SUCTIONS) ×2 IMPLANT
UNDERPAD 30X30 INCONTINENT (UNDERPADS AND DIAPERS) ×2 IMPLANT
WATER STERILE IRR 1000ML POUR (IV SOLUTION) IMPLANT
YANKAUER SUCT BULB TIP NO VENT (SUCTIONS) ×2 IMPLANT

## 2012-10-20 NOTE — Brief Op Note (Signed)
10/13/2012 - 10/20/2012  9:48 AM  PATIENT:  Luke [name]  32 y.o. male  PRE-OPERATIVE DIAGNOSIS:  Left forearm abscess  POST-OPERATIVE DIAGNOSIS:  same  PROCEDURE:  Procedure(s): INCISION AND DRAINAGE DEBRIDEMENT FOREARM (Left)  SURGEON:  Surgeon(s) and Role:    * Linna Hoff, MD - Primary  PHYSICIAN ASSISTANT:   ASSISTANTS: none   ANESTHESIA:   local and general  EBL:     BLOOD ADMINISTERED:none  DRAINS: none   LOCAL MEDICATIONS USED:  NONE  SPECIMEN:  No Specimen  DISPOSITION OF SPECIMEN:  N/A  COUNTS:  YES  TOURNIQUET:    DICTATION: .dictated  PLAN OF CARE: Admit to inpatient   PATIENT DISPOSITION:  PACU - hemodynamically stable.   Delay start of Pharmacological VTE agent (>24hrs) due to surgical blood loss or risk of bleeding: not applicable  Plan: continue iv antibiotics for 48 hours Will need to look at wound in 5-7 days transiition to oral abx? Doubt could send home in iv abx with access given history of drug use and him already leaving ama once in hospital stay

## 2012-10-20 NOTE — Anesthesia Preprocedure Evaluation (Signed)
Anesthesia Evaluation  Patient identified by MRN, date of birth, ID band Patient awake    Reviewed: Allergy & Precautions, H&P , NPO status , Patient's Chart, lab work & pertinent test results  Airway Mallampati: I TM Distance: >3 FB Neck ROM: Full    Dental  (+) Teeth Intact and Dental Advisory Given   Pulmonary  breath sounds clear to auscultation        Cardiovascular Rhythm:Regular Rate:Normal     Neuro/Psych    GI/Hepatic   Endo/Other    Renal/GU      Musculoskeletal   Abdominal   Peds  Hematology   Anesthesia Other Findings   Reproductive/Obstetrics                           Anesthesia Physical Anesthesia Plan  ASA: III  Anesthesia Plan: General   Post-op Pain Management:    Induction: Intravenous  Airway Management Planned: LMA  Additional Equipment:   Intra-op Plan:   Post-operative Plan:   Informed Consent: I have reviewed the patients History and Physical, chart, labs and discussed the procedure including the risks, benefits and alternatives for the proposed anesthesia with the patient or authorized representative who has indicated his/her understanding and acceptance.   Dental advisory given  Plan Discussed with: CRNA and Anesthesiologist  Anesthesia Plan Comments: (L. Arm Abscess S/P incision and drainage x2 Type 2 DM glucose glucose 206 H/O IV drug abuse  Plan GA with LMA  Roberts Gaudy, MD)        Anesthesia Quick Evaluation

## 2012-10-20 NOTE — Anesthesia Postprocedure Evaluation (Signed)
Anesthesia Post Note  [name]  Procedure(s) Performed: Procedure(s) (LRB): INCISION AND DRAINAGE AND DEBRIDEMENT LEFT  FOREARM (Left)  Anesthesia type: General  Patient location: PACU  Post pain: Pain level controlled and Adequate analgesia  Post assessment: Post-op Vital signs reviewed, Patient's Cardiovascular Status Stable, Respiratory Function Stable, Patent Airway and Pain level controlled  Last Vitals:  Filed Vitals:   10/20/12 1245  BP: 153/89  Pulse: 90  Temp:   Resp: 11    Post vital signs: Reviewed and stable  Level of consciousness: awake, alert  and oriented  Complications: No apparent anesthesia complications

## 2012-10-20 NOTE — Progress Notes (Addendum)
[facility] PROGRESS NOTE  JEJUAN TOWN [id number] DOB: 21-Aug-1980 DOA: 10/13/2012 PCP: Default, Provider, MD  Interim summary:  Patient is a 32 year old African American male with past medical history of anxiety, diabetes mellitus secondary renal disease and IV drug abuse who had been recently hospitalized for infection and abscess of his left upper extremity leading to wound debridement and on IV antibiotics. He had left AMA but was urged by his surgeon to come back. He was re-admitted on 7/29  with fever and pain/swelling of his left arm.  Since admission, the patient has had recurrent debridements done in the operating room. He has been continued on IV Unasyn with decline overall and his white count, with some persistent elevation. He is required significant pain control using Dilaudid PCA. Patient also came in acute renal failure with a creatinine of 2.6 on readmission versus baseline of 1.6. Following hydration and discontinuation of vancomycin, creatinine has come down and is closer to his baseline now. Patient's course was also complicated by anemia requiring transfusion on 8/3. This was felt to be secondary to chronic renal disease and underlying infection. Plan is to continue IV antibiotics for another 2 days and discharged the patient on Wednesday or Thursday with by mouth antibiotics. In the interim, we'll plan to taper down patient's pain medications.   Assessment/Plan: Principal Problem:   Abscess: Status post treatment.  in review of all wound cultures, only thing that has grown out his coag-negative staph pan sensitive..  Repeat  I & D done  yesterday .  surgery feels no further debridement needed in wounds closed.   Active Problems: Anemia: Likely chronic disease from renal dysfunction. Patient's hemoglobin improved after 2 units packed red blood cells transfused on Sunday. Recheck hemoglobin tomorrow. Noted drop from 10-9 today    Drug abuse, IV:  have counseled patient.  Please note that his family is unaware of the drugs the patient has been abusing. We'll plan to change PCA to reduced strength this evening. On 8/6, recommend discontinuing PCA and putting patient on MS Contin 60 every 12 hours (patient is normally on Opana every 12 hours at home & hospital equivalent is MS Contin 60 mg every 12 hours) + and when necessary IV pain medicine. Is being discharged on 8/7, discharged on Opana 30mg  q 12 + by mouth Oxy IR.   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 CBG still markedly elevated. Have asked diabetes coordinator for assistanceLevemir increased which is shown some improvement in his blood sugars. As infection continues to improve, Levemir may need to be titrated down as outpatient     HTN (hypertension): Elevated blood pressures, secondary to pain an& withdrawal, slowly improving   Chronic kidney disease stage 2-looks to be at baseline. Slight improvement     Leukocytosis-improving with debridement. change patient's antibiotics to see if his white count improves   IV access: Given renal dysfunction, reluctant to use PICC line. Critical care placed a central line on  Sunday. PICC line will need to be discontinued prior to discharge   Code Status: Full  Family Communication: Spoke with patient's mother at the bedsideYesterday   Disposition Plan: Home eventually    Consultants:  Dr. Carollee Leitz surgery  Procedures:  Status post irrigation and debridement of left forearm on 7/29, repeat on 8/1 And again on 8/4    PICC line placed 8/3 Transfusion of 2 units packed red blood cells on 8/3  Antibiotics:  Patient received one dose of IV Cipro and vancomycin in the emergency room   He has been on IV Unasyn and Levaquin since  HPI/Subjective:  patient doing okay. Had been hopefully this is the last surgery. Pain moderately controlled   Objective: Filed  Vitals:   10/20/12 0219 10/20/12 0441 10/20/12 0800 10/20/12 0837  BP: 139/79 156/85  148/88  Pulse: 87 79  66  Temp: 98.3 F (36.8 C) 98.3 F (36.8 C)  98.5 F (36.9 C)  TempSrc:      Resp: 16 16 10 18   Height:      Weight:      SpO2: 96% 98% 100% 100%    Intake/Output Summary (Last 24 hours) at 10/20/12 0914 Last data filed at 10/20/12 0700  Gross per 24 hour  Intake   1610 ml  Output   3550 ml  Net  -1940 ml   Filed Weights   10/14/12 0900  Weight: 86.2 kg (190 lb 0.6 oz)    Exam:   General:  No acute distress, alert and oriented x3, overall feeling better  Cardiovascular: Regular rate and rhythm, S1-S2  Respiratory: Clear to auscultation bilaterally  Abdomen: Soft, NT, ND +BS  Musculoskeletal: Left UE wrapped, tender  Data Reviewed: Basic Metabolic Panel:  Recent Labs Lab 10/14/12 0820 10/15/12 1140 10/17/12 0533 10/18/12 1946 10/20/12 0500  NA 137 133* 144 139 140  K 3.4* 3.8 3.5 3.4* 3.6  CL 103 98 108 101 99  CO2 24 24 25 26 31   GLUCOSE 245* 454* 213* 189* 251*  BUN 16 12 7 13 13   CREATININE 2.47* 2.21* 1.89* 1.78* 1.82*  CALCIUM 8.5 8.7 8.3* 8.7 8.9   Liver Function Tests:  Recent Labs Lab 10/14/12 0820  AST 12  ALT 8  ALKPHOS 136*  BILITOT 0.2*  PROT 5.4*  ALBUMIN 2.0*   CBC:  Recent Labs Lab 10/14/12 0820 10/16/12 1444 10/17/12 0533 10/19/12 0458 10/20/12 0500  WBC 16.4* 13.7* 15.7* 12.7* 13.7*  NEUTROABS 10.8*  --   --   --   --   HGB 8.0* 7.9* 7.2* 7.0* 10.0*  HCT 22.4* 22.4* 20.5* 20.1* 28.5*  MCV 82.7 81.5 82.0 81.4 83.1  PLT 438* 621* 643* 751* 755*   CBG:  Recent Labs Lab 10/19/12 1631 10/19/12 2011 10/20/12 0021 10/20/12 0444 10/20/12 0829  GLUCAP 141* 129* 113* 206* 294*    Recent Results (from the past 240 hour(s))  CULTURE, BLOOD (ROUTINE X 2)     Status: None   Collection Time    10/11/12  3:00 AM      Result Value Range Status   Specimen Description BLOOD RIGHT WRIST   Final   Special  Requests BOTTLES DRAWN AEROBIC ONLY 10CC   Final   Culture  Setup Time 10/11/2012 18:12   Final   Culture NO GROWTH 5 DAYS   Final   Report Status 10/17/2012 FINAL   Final  CULTURE, BLOOD (ROUTINE X 2)     Status: None   Collection Time    10/11/12  3:15 AM      Result Value Range Status   Specimen Description BLOOD RIGHT ARM   Final   Special Requests BOTTLES DRAWN AEROBIC ONLY [facility]    Final   Culture  Setup Time 10/11/2012 18:13   Final   Culture NO GROWTH 5 DAYS   Final   Report Status 10/17/2012 FINAL   Final  WOUND CULTURE     Status: None   Collection  Time    10/11/12 10:32 AM      Result Value Range Status   Specimen Description WOUND LEFT ARM   Final   Special Requests SUPERFICIAL   Final   Gram Stain     Final   Value: RARE WBC PRESENT,BOTH PMN AND MONONUCLEAR     NO SQUAMOUS EPITHELIAL CELLS SEEN     FEW GRAM POSITIVE COCCI     IN PAIRS IN CLUSTERS   Culture     Final   Value: FEW STAPHYLOCOCCUS SPECIES (COAGULASE NEGATIVE)     Note: RIFAMPIN AND GENTAMICIN SHOULD NOT BE USED AS SINGLE DRUGS FOR TREATMENT OF STAPH INFECTIONS.   Report Status 10/15/2012 FINAL   Final   Organism ID, Bacteria STAPHYLOCOCCUS SPECIES (COAGULASE NEGATIVE)   Final  AFB CULTURE WITH SMEAR     Status: None   Collection Time    10/11/12 10:32 AM      Result Value Range Status   Specimen Description WOUND LEFT ARM   Final   Special Requests SUPERFICIAL   Final   ACID FAST SMEAR NO ACID FAST BACILLI SEEN   Final   Culture     Final   Value: CULTURE WILL BE EXAMINED FOR 6 WEEKS BEFORE ISSUING A FINAL REPORT   Report Status PENDING   Incomplete  ANAEROBIC CULTURE     Status: None   Collection Time    10/11/12 10:32 AM      Result Value Range Status   Specimen Description WOUND LEFT ARM   Final   Special Requests SUPERFICIAL   Final   Gram Stain     Final   Value: RARE WBC PRESENT,BOTH PMN AND MONONUCLEAR     NO SQUAMOUS EPITHELIAL CELLS SEEN     FEW GRAM POSITIVE COCCI     IN PAIRS IN  CLUSTERS   Culture NO ANAEROBES ISOLATED   Final   Report Status 10/16/2012 FINAL   Final  WOUND CULTURE     Status: None   Collection Time    10/11/12 10:32 AM      Result Value Range Status   Specimen Description WOUND LEFT ARM   Final   Special Requests DEEP   Final   Gram Stain     Final   Value: RARE WBC PRESENT,BOTH PMN AND MONONUCLEAR     RARE SQUAMOUS EPITHELIAL CELLS PRESENT     FEW GRAM POSITIVE COCCI     IN PAIRS IN CLUSTERS FEW GRAM NEGATIVE RODS   Culture     Final   Value: MULTIPLE ORGANISMS PRESENT, NONE PREDOMINANT     Note: NO STAPHYLOCOCCUS AUREUS ISOLATED NO GROUP A STREP (S.PYOGENES) ISOLATED   Report Status 10/13/2012 FINAL   Final  AFB CULTURE WITH SMEAR     Status: None   Collection Time    10/11/12 10:32 AM      Result Value Range Status   Specimen Description WOUND LEFT ARM   Final   Special Requests DEEP   Final   ACID FAST SMEAR NO ACID FAST BACILLI SEEN   Final   Culture     Final   Value: CULTURE WILL BE EXAMINED FOR 6 WEEKS BEFORE ISSUING A FINAL REPORT   Report Status PENDING   Incomplete  ANAEROBIC CULTURE     Status: None   Collection Time    10/11/12 10:32 AM      Result Value Range Status   Specimen Description WOUND LEFT ARM   Final   Special  Requests DEEP   Final   Gram Stain     Final   Value: RARE WBC PRESENT,BOTH PMN AND MONONUCLEAR     RARE SQUAMOUS EPITHELIAL CELLS PRESENT     FEW GRAM POSITIVE COCCI     IN PAIRS IN CLUSTERS FEW GRAM NEGATIVE RODS   Culture NO ANAEROBES ISOLATED   Final   Report Status 10/16/2012 FINAL   Final  SURGICAL PCR SCREEN     Status: None   Collection Time    10/20/12  2:10 AM      Result Value Range Status   MRSA, PCR NEGATIVE  NEGATIVE Final   Staphylococcus aureus NEGATIVE  NEGATIVE Final   Comment:            The Xpert SA Assay (FDA     approved for NASAL specimens     in patients over 18 years of age),     is one component of     a comprehensive surveillance     program.  Test performance has      been validated by [employer] for patients greater     than or equal to 89 year old.     It is not intended     to diagnose infection nor to     guide or monitor treatment.     Studies: Dg Chest Port 1 View  10/19/2012     IMPRESSION:  1.  Satisfactory positioning of a right IJ line at the level of the cavoatrial junction. 2.  No pneumothorax. 3.  Low lung volumes and mild bibasilar airspace disease, likely reflecting atelectasis.   Original Report Authenticated By: San Morelle, M.D.    Scheduled Meds: . [MAR HOLD] ampicillin-sulbactam (UNASYN) IV  3 g Intravenous Q6H  . [facility] HOLD] docusate sodium  100 mg Oral BID  . [MAR HOLD] heparin  5,000 Units Subcutaneous Q8H  . [MAR HOLD] HYDROmorphone PCA 0.3 mg/mL   Intravenous Q4H  . [MAR HOLD] insulin aspart  0-20 Units Subcutaneous Q4H  . [MAR HOLD] insulin detemir  40 Units Subcutaneous Daily  . [MAR HOLD] levofloxacin (LEVAQUIN) IV  500 mg Intravenous Q24H  . [MAR HOLD] LORazepam  0.25 mg Intravenous TID  . [facility] HOLD] nicotine  14 mg Transdermal Daily  . [facility] HOLD] pantoprazole  40 mg Oral Daily   Continuous Infusions: . sodium chloride 20 mL/hr at 10/20/12 0009    Principal Problem:   Abscess Active Problems:   Drug abuse, IV   DM type 2, uncontrolled, with renal complications   HTN (hypertension)   Chronic kidney disease, stage 3   Leukocytosis   Anemia in chronic kidney disease    Time spent: 30 min    [facility] Pager [phone] If 7PM-7AM, please contact night-coverage at [email or web address], password [facility] 10/20/2012, 9:14 AM  LOS: 7 days

## 2012-10-20 NOTE — Transfer of Care (Signed)
Immediate Anesthesia Transfer of Care Note  [name]  Procedure(s) Performed: Procedure(s): INCISION AND DRAINAGE AND DEBRIDEMENT LEFT  FOREARM (Left)  Patient Location: PACU  Anesthesia Type:General  Level of Consciousness: awake  Airway & Oxygen Therapy: Patient Spontanous Breathing and Patient connected to nasal cannula oxygen  Post-op Assessment: Report given to PACU RN and Post -op Vital signs reviewed and stable  Post vital signs: Reviewed and stable  Complications: No apparent anesthesia complications

## 2012-10-20 NOTE — Progress Notes (Signed)
Pt's blood sugar was 206 at 0400. Pt refused to take novolog insulin because he is NPO and scheduled for surgery this a.m.

## 2012-10-21 ENCOUNTER — Encounter (HOSPITAL_COMMUNITY): Payer: Self-pay | Admitting: Orthopedic Surgery

## 2012-10-21 LAB — BASIC METABOLIC PANEL
Chloride: 98 mEq/L (ref 96–112)
GFR calc non Af Amer: 49 mL/min — ABNORMAL LOW (ref >90)
Glucose, Bld: 249 mg/dL — ABNORMAL HIGH (ref 70–99)
Potassium: 3.8 mEq/L (ref 3.5–5.1)
Sodium: 138 mEq/L (ref 135–145)

## 2012-10-21 LAB — CBC
Hemoglobin: 9 g/dL — ABNORMAL LOW (ref 13.0–17.0)
MCHC: 35 g/dL (ref 30.0–36.0)
RBC: 3.05 MIL/uL — ABNORMAL LOW (ref 4.22–5.81)
WBC: 15.2 10*3/uL — ABNORMAL HIGH (ref 4.0–10.5)

## 2012-10-21 LAB — GLUCOSE, CAPILLARY
Glucose-Capillary: 116 mg/dL — ABNORMAL HIGH (ref 70–99)
Glucose-Capillary: 241 mg/dL — ABNORMAL HIGH (ref 70–99)
Glucose-Capillary: 250 mg/dL — ABNORMAL HIGH (ref 70–99)

## 2012-10-21 MED ORDER — NALOXONE HCL 0.4 MG/ML IJ SOLN: 0.4000 mg | INTRAMUSCULAR | Status: DC | PRN

## 2012-10-21 MED ORDER — HYDROMORPHONE 0.3 MG/ML IV SOLN
INTRAVENOUS | Status: DC
  Administered 2012-10-22 (×2): via INTRAVENOUS
  Administered 2012-10-22: 4.5 mg via INTRAVENOUS
  Administered 2012-10-22: 4.19 mg via INTRAVENOUS
  Administered 2012-10-22: 4.8 mg via INTRAVENOUS
  Administered 2012-10-22: 3.4 mg via INTRAVENOUS
  Administered 2012-10-22 (×2): 3.3 mg via INTRAVENOUS
  Administered 2012-10-23: 04:00:00 via INTRAVENOUS
  Administered 2012-10-23: 4.2 mg via INTRAVENOUS
  Administered 2012-10-23 (×2): 1.2 mg via INTRAVENOUS
  Filled 2012-10-21 (×4): qty 25

## 2012-10-21 MED ORDER — ONDANSETRON HCL 4 MG/2ML IJ SOLN: 4.0000 mg | Freq: Four times a day (QID) | INTRAMUSCULAR | Status: DC | PRN

## 2012-10-21 MED ORDER — SODIUM CHLORIDE 0.9 % IJ SOLN
10.0000 mL | INTRAMUSCULAR | Status: DC | PRN
  Administered 2012-10-22 (×2): 10 mL

## 2012-10-21 MED ORDER — SODIUM CHLORIDE 0.9 % IJ SOLN: 9.0000 mL | INTRAMUSCULAR | Status: DC | PRN

## 2012-10-21 NOTE — Evaluation (Addendum)
Occupational Therapy Evaluation [name] MRN: [id number] DOB: 02-22-1981 Today's Date: 10/21/2012 Time: [id number] OT Time Calculation (min): 28 min  OT Assessment / Plan / Recommendation History of present illness Incision and Drainage and Debridement Left Forearm   Clinical Impression   Pt presents with above. OT educated on edema control techniques and also performed some ADLs. Pt's wife is in school during the day. Will see patient tomorrow to review and perform edema control techniques and digital motion.     OT Assessment  Patient needs continued OT Services    Follow Up Recommendations  No OT follow up    Barriers to Discharge      Equipment Recommendations  None recommended by OT    Recommendations for Other Services    Frequency  Min 2X/week    Precautions / Restrictions Precautions Precautions: Other (comment) (edema control and digital motion) Required Braces or Orthoses: Other Brace/Splint (splint on LUE) Restrictions Weight Bearing Restrictions: Yes LUE Weight Bearing: Non weight bearing   Pertinent Vitals/Pain Pain 9/10. Repositioned. Elevated. Using PCA prn.     ADL  Eating/Feeding: Independent Where Assessed - Eating/Feeding: Edge of bed Lower Body Dressing: Supervision/safety;Set up (socks) Where Assessed - Lower Body Dressing: Unsupported sit to stand Toilet Transfer: Simulated;Supervision/safety;Performed Toilet Transfer Method: Sit to stand;Other (comment) (practiced sit to stand; also urinated standing) Toilet Transfer Equipment: Regular height toilet Toileting - Clothing Manipulation and Hygiene: Supervision/safety Where Assessed - Toileting Clothing Manipulation and Hygiene: Standing;Other (comment) (urinated standing) Tub/Shower Transfer Method: Not assessed Equipment Used: Gait belt;Other (comment) (Splint on LUE) Transfers/Ambulation Related to ADLs: Minguard for ambulation and supervision for transfers. Slight LOB  during ambulation 2-3x. Suspect due to medication. ADL Comments: OT educated on elevating LUE to control edema and also encouraged pt to move digits. Pt able to perform slight AROM of digits. OT provided gentle PROM of fingers. OT taught patient very gentle PROM/AAROM of fingers and encouraged him to try to actively move them. OT also educated and performed retrograde massage to fingers. Pt able to don socks sitting EOB. Pt able to don socks sitting EOB at setup/supervision level and also pulled up pants. Educated to avoid using RUE for grooming tasks and eating to avoid risk for infection. Also, educated to use LUE for light assist only-no force with that UE. Went back to pt's room later  and applied ice to LUE.    OT Diagnosis: Acute pain  OT Problem List: Decreased range of motion;Impaired balance (sitting and/or standing);Decreased knowledge of precautions;Pain;Impaired UE functional use;Impaired sensation;Increased edema OT Treatment Interventions: Therapeutic exercise;Therapeutic activities;Self-care/ADL training;Patient/family education;Balance training;Modalities   OT Goals(Current goals can be found in the care plan section) Acute Rehab OT Goals Patient Stated Goal: to get home OT Goal Formulation: With patient Time For Goal Achievement: 10/28/12 Potential to Achieve Goals: Good ADL Goals Pt Will Transfer to Toilet: with modified independence;ambulating;regular height toilet Pt Will Perform Toileting - Clothing Manipulation and hygiene: with modified independence;sit to/from stand Additional ADL Goal #1: Pt will independently verbalize and demonstrate edema control techniques for LUE.   Visit Information  Last OT Received On: 10/21/12 Assistance Needed: +1 History of Present Illness: Incision and Drainage and Debridement Left Forearm       Prior Functioning     Home Living Family/patient expects to be discharged to:: Private residence Living Arrangements: Spouse/significant  other Available Help at Discharge: Family;Available PRN/intermittently (32 year old there 24/7) Type of Home: House Home Access: Stairs to enter [employer]  of Steps: 1 Entrance Stairs-Rails: None Home Layout: Two level Alternate Level Stairs-Number of Steps: 20 Alternate Level Stairs-Rails: Right Home Equipment: None Prior Function Level of Independence: Independent Communication Communication: No difficulties Dominant Hand: Right         Vision/Perception     Cognition  Cognition Arousal/Alertness: Awake/alert Behavior During Therapy: WFL for tasks assessed/performed Overall Cognitive Status: Within Functional Limits for tasks assessed    Extremity/Trunk Assessment Upper Extremity Assessment Upper Extremity Assessment: LUE deficits/detail LUE Deficits / Details: Incision and Drainage and Debridement Left Forearm. Limited AROM in fingers LUE Sensation: decreased light touch (first three digits)     Mobility Bed Mobility Bed Mobility: Supine to Sit;Sit to Sidelying Right;Right Sidelying to Sit Right Sidelying to Sit: 5: Supervision;HOB flat Supine to Sit: 5: Supervision Sit to Sidelying Right: 5: Supervision;HOB flat Details for Bed Mobility Assistance: cues not to put weight through LUE when adjusting in bed. Transfers Transfers: Sit to Stand;Stand to Sit Sit to Stand: 5: Supervision;From bed;From toilet Stand to Sit: 5: Supervision;To bed;To toilet Details for Transfer Assistance: Supervision for safety.     Exercise     Balance     End of Session OT - End of Session Equipment Utilized During Treatment: Gait belt Activity Tolerance: Patient tolerated treatment well Patient left: in bed;with family/visitor present  GO     Benito Mccreedy OTR/L [id number] 10/21/2012, 3:49 PM

## 2012-10-21 NOTE — Progress Notes (Signed)
Inpatient Diabetes Program Recommendations  AACE/ADA: New Consensus Statement on Inpatient Glycemic Control (2013)  Target Ranges:  Prepandial:   less than 140 mg/dL      Peak postprandial:   less than 180 mg/dL (1-2 hours)      Critically ill patients:  140 - 180 mg/dL     Results for Luke [name], Luke [name] (MRN [id number]) as of 10/21/2012 14:17  Ref. Range 10/20/2012 00:21 10/20/2012 04:44 10/20/2012 08:29 10/20/2012 11:14 10/20/2012 13:58 10/20/2012 16:24 10/20/2012 20:35  Glucose-Capillary Latest Range: 70-99 mg/dL 113 (H) 206 (H) 294 (H) 206 (H) 258 (H) 421 (H) 361 (H)    Results for Luke [name], Luke [name] (MRN [id number]) as of 10/21/2012 14:17  Ref. Range 10/20/2012 23:55 10/21/2012 01:17 10/21/2012 03:54 10/21/2012 07:49 10/21/2012 11:28  Glucose-Capillary Latest Range: 70-99 mg/dL 76 116 (H) 241 (H) 244 (H) 222 (H)    **MD, Please note that patient did not receive Levemir insulin dose yesterday (08/04)!  **Noted patient did not receive Levemir insulin yesterday (08/04).  CBGs became markedly elevated late yesterday afternoon.  Per records, patient is supposedly taking 70/30 insulin at home (55 units in the morning and 45 units in the evening) along with Novolog SSI.  Doubt he is taking insulin as prescribed.    **Noted Levemir insulin increased further to 45 units daily today   **Recommend the following in-hospital insulin adjustments:  1. Titrate Levemir insulin upward as needed.  If fasting sugar still elevated tomorrow morning, Increase Levemir further to 50 units daily 2. Change SSI to Novolog Moderate tid ac + HS (currently ordered as Resistant Q4 hours) 3. Add Novolog meal coverage- Novolog 6 units tid with meals   Will follow. Wyn Quaker RN, MSN, CDE Diabetes Coordinator Inpatient Diabetes Program [phone]

## 2012-10-21 NOTE — Op Note (Signed)
NAMEDEBORAH, Luke [name] NO.:  [id number]  MEDICAL RECORD NO.:  [id number]  LOCATION:  PERIO                        FACILITY:  [name]  PHYSICIAN:  Melrose Nakayama, MD  DATE OF BIRTH:  1980-10-22  DATE OF PROCEDURE:  10/20/2012 DATE OF DISCHARGE:                              OPERATIVE REPORT   PREOPERATIVE DIAGNOSIS:  Left forearm deep abscess.  POSTOPERATIVE DIAGNOSIS:  Left forearm deep abscess.  PROCEDURE: 1. Incision and drainage of left forearm deep abscess. 2. Forearm decompression, fasciotomy, debridement of nonviable muscle,     excisional debridement of the flexor musculature.  ATTENDING PHYSICIAN:  Melrose Nakayama, MD who scrubbed and present for the entire procedure.  ASSISTANT SURGEON:  None.  SURGICAL INDICATION:  Mr. 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  The patient presented with re-infection in the upper extremity.  The patient has been taken back to the operating room 4 previous times for the severity of his infection.  He is brought back today for repeat I and D and possible closure.  DESCRIPTION OF PROCEDURE:  The patient was properly identified in the preop holding area.  Mark with a permanent marker made around the left arm to indicate the correct operative site.  The patient was then brought back to the operating room, placed supine on anesthesia room table.  General anesthesia was administered.  The patient received IV antibiotics prior to induction.  A well-padded tourniquet was then placed on the left brachium and sealed with 1000 drape.  Left upper extremity was then prepped and draped in normal sterile fashion.  Time- out was called, correct side was identified, and the procedure then begun.  The incision was then opened up.  The patient's wound looked a lot better today.  The patient did have very minimal purulent fluid. The patient did have nonviable muscle  in the flexor compartment again in the FDS.  Excisional debridement was then carried out with a rongeur, sharp scissors, and knife of the FDS and FCR.  Further debris was then carried along the flexor tendons of the carpal canal.  The FDS to the index, long, ring, and small which had been excised.  Further debridement was then carried out proximally again along the FCR and palmaris longus with careful protection of the median nerve.  After decompressive fasciotomy and excisional debridement, the wound was then thoroughly irrigated with pulsatile lavage.  Following this, the wound was then closed with 2-0 nylon sutures.  Adaptic dressing and sterile compressive bandage were then applied.  The patient was placed in a long- arm splint, extubated, and taken to the recovery room in good condition.  POSTPROCEDURE PLAN:  The patient looked better and now the fifth time back to the operating room.  We did close the wound today.  We will continue the IV antibiotics.  We will need to look at the wound within the next 5-7 days.  IV antibiotics for the next 48 hours and potential discussion about going home and IV antibiotics whether that is going to be possible given his history of drug use and maintaining a central line  would be very concerning.     Melrose Nakayama, MD     FWO/MEDQ  D:  10/20/2012  T:  10/21/2012  Job:  [id number]

## 2012-10-22 LAB — BASIC METABOLIC PANEL
BUN: 18 mg/dL (ref 6–23)
Calcium: 8.7 mg/dL (ref 8.4–10.5)
GFR calc Af Amer: 59 mL/min — ABNORMAL LOW (ref >90)
GFR calc non Af Amer: 51 mL/min — ABNORMAL LOW (ref >90)
Glucose, Bld: 212 mg/dL — ABNORMAL HIGH (ref 70–99)
Sodium: 138 mEq/L (ref 135–145)

## 2012-10-22 LAB — GLUCOSE, CAPILLARY
Glucose-Capillary: 328 mg/dL — ABNORMAL HIGH (ref 70–99)
Glucose-Capillary: 120 mg/dL — ABNORMAL HIGH (ref 70–99)
Glucose-Capillary: 138 mg/dL — ABNORMAL HIGH (ref 70–99)

## 2012-10-22 LAB — CBC
MCH: 28.9 pg (ref 26.0–34.0)
MCHC: 34 g/dL (ref 30.0–36.0)
Platelets: 665 10*3/uL — ABNORMAL HIGH (ref 150–400)
RBC: 3.01 MIL/uL — ABNORMAL LOW (ref 4.22–5.81)

## 2012-10-22 MED ORDER — INSULIN DETEMIR 100 UNIT/ML ~~LOC~~ SOLN
50.0000 [IU] | Freq: Every day | SUBCUTANEOUS | Status: DC
  Administered 2012-10-22 – 2012-10-23 (×2): 50 [IU] via SUBCUTANEOUS
  Filled 2012-10-22 (×2): qty 0.5

## 2012-10-22 NOTE — Progress Notes (Signed)
Occupational Therapy Treatment [name] MRN: [id number] DOB: February 03, 1981 Today's Date: 10/22/2012 Time: [id number] OT Time Calculation (min): 23 min  OT Assessment / Plan / Recommendation  History of present illness Incision and Drainage and Debridement Left Forearm   OT comments   Session focused on digital motion and edema control. OT performed retrograde massage to fingers, applied ice, and also elevated LUE on pillows. Performed AROM of digits and also AAROM exercises of digits. Stressed importance of maintaining NWB status of LUE and also not pushing, pulling, lifting with LUE. Only very very light use-no force. OT educated again not to use LUE for feeding or grooming to prevent infection. Pt verbalized understanding. Encouraged to move fingers thoughout day and also to elevate LUE.   Follow Up Recommendations  No OT follow up    Barriers to Discharge       Equipment Recommendations  None recommended by OT    Recommendations for Other Services    Frequency Min 2X/week   Progress towards OT Goals Progress towards OT goals: Progressing toward goals  Plan Discharge plan remains appropriate    Precautions / Restrictions Precautions Precautions: Other (comment) (edema control and digital motion) Required Braces or Orthoses: Other Brace/Splint (splint on LUE) Restrictions Weight Bearing Restrictions: Yes LUE Weight Bearing: Non weight bearing   Pertinent Vitals/Pain Pain 9/10. Using pca as needed.     ADL  ADL Comments: Session focused on digital motion and edema control. OT performed retrograde massage to fingers, applied ice, and also elevated on pillows. Performed AROM of digits and also AAROM exercises of digits. Stressed importance of maintaining NWB status of LUE and also not pushing, pulling, lifting with LUE. Only very very light use-no force. Pt verbalized understanding. Encouraged to move fingers thoughout day and also to elevate LUE.    OT  Diagnosis:    OT Problem List:   OT Treatment Interventions:     OT Goals(current goals can now be found in the care plan section) Acute Rehab OT Goals Patient Stated Goal: to get home OT Goal Formulation: With patient Time For Goal Achievement: 10/28/12 Potential to Achieve Goals: Good ADL Goals Pt Will Transfer to Toilet: with modified independence;ambulating;regular height toilet Pt Will Perform Toileting - Clothing Manipulation and hygiene: with modified independence;sit to/from stand Additional ADL Goal #1: Pt will independently verbalize and demonstrate edema control techniques for LUE.   Visit Information  Last OT Received On: 10/22/12 Assistance Needed: +1 History of Present Illness: Incision and Drainage and Debridement Left Forearm    Subjective Data      Prior Functioning       Cognition  Cognition Arousal/Alertness: Lethargic Behavior During Therapy: WFL for tasks assessed/performed Overall Cognitive Status: Within Functional Limits for tasks assessed    Mobility  Bed Mobility Bed Mobility: Not assessed Transfers Transfers: Not assessed    Exercises      Balance     End of Session OT - End of Session Activity Tolerance: Patient tolerated treatment well Patient left: in bed;with call bell/phone within reach;with family/visitor present Nurse Communication: Mobility status  GO     Benito Mccreedy OTR/L [id number] 10/22/2012, 3:45 PM

## 2012-10-22 NOTE — Progress Notes (Signed)
Inpatient Diabetes Program Recommendations  AACE/ADA: New Consensus Statement on Inpatient Glycemic Control (2013)  Target Ranges:  Prepandial:   less than 140 mg/dL      Peak postprandial:   less than 180 mg/dL (1-2 hours)      Critically ill patients:  140 - 180 mg/dL     Results for Luke [name], Luke [name] (MRN [id number]) as of 10/22/2012 10:22  Ref. Range 10/20/2012 23:55 10/21/2012 01:17 10/21/2012 03:54 10/21/2012 07:49 10/21/2012 11:28 10/21/2012 16:23 10/21/2012 19:54  Glucose-Capillary Latest Range: 70-99 mg/dL 76 116 (H) 241 (H) 244 (H) 222 (H) 250 (H) 285 (H)    Results for Luke [name], Luke [name] (MRN [id number]) as of 10/22/2012 10:22  Ref. Range 10/22/2012 00:08 10/22/2012 04:42 10/22/2012 08:43  Glucose-Capillary Latest Range: 70-99 mg/dL 69 (L) 196 (H) 328 (H)    **Noted Levemir increased to 50 units daily today  **Patient had mild hypoglycemia at midnight likely due to the large dose of Novolog he received at 8pm  MD- Please consider the following in-hospital insulin adjustments:  1. Change SSI to Novolog Moderate tid ac + HS (currently ordered as Resistant Q4 hours)  2. Add Novolog meal coverage- Novolog 6 units tid with meals   Will follow. Wyn Quaker RN, MSN, CDE Diabetes Coordinator Inpatient Diabetes Program [phone]

## 2012-10-22 NOTE — Progress Notes (Signed)
[facility] PROGRESS NOTE  Luke [name] [id number] DOB: 10-22-1980 DOA: 10/13/2012 PCP: Default, Provider, MD  Assessment/Plan: Principal Problem:   Abscess Active Problems:   Drug abuse, IV   DM type 2, uncontrolled, with renal complications   HTN (hypertension)   Chronic kidney disease, stage 3   Leukocytosis   Anemia in chronic kidney disease    Interim summary:  Patient is a 32 year old African American male with past medical history of anxiety, diabetes mellitus secondary renal disease and IV drug abuse who had been recently hospitalized for infection and abscess of his left upper extremity leading to wound debridement and on IV antibiotics. He had left AMA but was urged by his surgeon to come back. He was re-admitted on 7/29 with fever and pain/swelling of his left arm.  Since admission, the patient has had 5 debridements done in the operating room. He has been continued on IV Unasyn with decline overall and his white count, with some persistent elevation. He is required significant pain control using Dilaudid PCA. Patient also came in acute renal failure with a creatinine of 2.6 on readmission versus baseline of 1.6. Following hydration and discontinuation of vancomycin, creatinine has come down and is closer to his baseline now. Patient's course was also complicated by anemia requiring transfusion on 8/3. This was felt to be secondary to chronic renal disease and underlying infection. Plan is to continue IV antibiotics for another 2 days and discharged the patient on Wednesday or Thursday with by mouth antibiotics. In the interim, we'll plan to taper down patient's pain medications.   Assessment/Plan:  Principal Problem:  Abscess: Status post treatment. in review of all wound cultures, only thing that has grown out his coag-negative staph pan sensitive.. Repeat I & D done yesterday . surgery feels no further debridement needed in wounds closed.     Anemia: Likely chronic  disease from renal dysfunction. Patient's hemoglobin improved after 2 units packed red blood cells transfused on Sunday. Recheck hemoglobin tomorrow. Noted drop from 10-9 today    Drug abuse, IV: have counseled patient. Please note that his family is unaware of the drugs the patient has been abusing. We'll plan to change PCA to reduced strength this evening. On 8/6, recommend discontinuing PCA and putting patient on MS Contin 60 every 12 hours (patient is normally on Opana every 12 hours at home & hospital equivalent is MS Contin 60 mg every 12 hours) + and when necessary IV pain medicine. Is being discharged on 8/7, discharged on Opana 30mg  q 12 + by mouth Oxy IR.    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 CBG still markedly elevated. Have asked diabetes coordinator for assistanceLevemir increased which is shown some improvement in his blood sugars. As infection continues to improve, Levemir may need to be titrated down as outpatient    HTN (hypertension): Elevated blood pressures, secondary to pain an& withdrawal, slowly improving    Chronic kidney disease stage 2-looks to be at baseline. Slight improvement  Leukocytosis-improving with debridement. change patient's antibiotics to see if his white count improves    IV access: Given renal dysfunction, reluctant to use PICC line. Critical care placed a central line on Sunday. PICC line will need to be discontinued prior to discharge    Code Status: Full  Family Communication: Spoke with patient's mother at the bedsideYesterday  Disposition Plan: Home eventually    Consultants:  Dr. Carollee Leitz surgery Procedures:  Status  post irrigation and debridement of left forearm on 7/29, repeat on 8/1 And again on 8/4  PICC line placed 8/3 Transfusion of 2 units packed red blood cells on 8/3  Antibiotics:  Patient received one dose of IV Cipro and vancomycin  in the emergency room  He has been on IV Unasyn and Levaquin since     HPI/Subjective:  no complaints , still on the PCA   Objective: Filed Vitals:   10/21/12 2222 10/22/12 0000 10/22/12 0400 10/22/12 0440  BP:    150/86  Pulse:    78  Temp:    98 F (36.7 C)  TempSrc:      Resp: 16 16 16 16   Height:      Weight:      SpO2: 100% 100% 100% 99%    Intake/Output Summary (Last 24 hours) at 10/22/12 0804 Last data filed at 10/22/12 0441  Gross per 24 hour  Intake    480 ml  Output      0 ml  Net    480 ml    Exam:  HENT:  Head: Atraumatic.  Nose: Nose normal.  Mouth/Throat: Oropharynx is clear and moist.  Eyes: Conjunctivae are normal. Pupils are equal, round, and reactive to light. No scleral icterus.  Neck: Neck supple. No tracheal deviation present.  Cardiovascular: Normal rate, regular rhythm, normal heart sounds and intact distal pulses.  Pulmonary/Chest: Effort normal and breath sounds normal. No respiratory distress.  Abdominal: Soft. Normal appearance and bowel sounds are normal. She exhibits no distension. There is no tenderness.  Musculoskeletal: She exhibits no edema and no tenderness.  Neurological: She is alert. No cranial nerve deficit.    Data Reviewed: Basic Metabolic Panel:  Recent Labs Lab 10/17/12 0533 10/18/12 1946 10/20/12 0500 10/21/12 0500 10/22/12 0500  NA 144 139 140 138 138  K 3.5 3.4* 3.6 3.8 3.9  CL 108 101 99 98 101  CO2 25 26 31 29 27   GLUCOSE 213* 189* 251* 249* 212*  BUN 7 13 13 18 18   CREATININE 1.89* 1.78* 1.82* 1.79* 1.72*  CALCIUM 8.3* 8.7 8.9 8.6 8.7    Liver Function Tests: No results found for this basename: AST, ALT, ALKPHOS, BILITOT, PROT, ALBUMIN,  in the last 168 hours No results found for this basename: LIPASE, AMYLASE,  in the last 168 hours No results found for this basename: AMMONIA,  in the last 168 hours  CBC:  Recent Labs Lab 10/17/12 0533 10/19/12 0458 10/20/12 0500 10/21/12 0500 10/22/12 0500   WBC 15.7* 12.7* 13.7* 15.2* 14.9*  HGB 7.2* 7.0* 10.0* 9.0* 8.7*  HCT 20.5* 20.1* 28.5* 25.7* 25.6*  MCV 82.0 81.4 83.1 84.3 85.0  PLT 643* 751* 755* 696* 665*    Cardiac Enzymes: No results found for this basename: CKTOTAL, CKMB, CKMBINDEX, TROPONINI,  in the last 168 hours BNP (last 3 results) No results found for this basename: PROBNP,  in the last 8760 hours   CBG:  Recent Labs Lab 10/21/12 1128 10/21/12 1623 10/21/12 1954 10/22/12 0008 10/22/12 0442  GLUCAP 222* 250* 285* 69* 196*    Recent Results (from the past 240 hour(s))  SURGICAL PCR SCREEN     Status: None   Collection Time    10/20/12  2:10 AM      Result Value Range Status   MRSA, PCR NEGATIVE  NEGATIVE Final   Staphylococcus aureus NEGATIVE  NEGATIVE Final   Comment:            The  Xpert SA Assay (FDA     approved for NASAL specimens     in patients over 71 years of age),     is one component of     a comprehensive surveillance     program.  Test performance has     been validated by [employer] for patients greater     than or equal to 33 year old.     It is not intended     to diagnose infection nor to     guide or monitor treatment.     Studies: US Renal  10/13/2012   *RADIOLOGY REPORT*  Clinical Data: Acute renal insufficiency.  RENAL/URINARY TRACT ULTRASOUND COMPLETE  Comparison:  No priors.  Findings:  Right Kidney:  No hydronephrosis.  Well-preserved cortex.  Normal size and parenchymal echotexture without focal abnormalities.  10.9 cm in length.  Left Kidney:  No hydronephrosis.  Well-preserved cortex.  Normal size and parenchymal echotexture without focal abnormalities.  11.7 cm in length.  Bladder:  Well distended without focal neural abnormalities.  IMPRESSION: 1.  Normal sonographic appearance of the kidneys and urinary bladder.   Original Report Authenticated By: Vinnie Langton, M.D.   Dg Chest Port 1 View  10/19/2012   *RADIOLOGY REPORT*  Clinical Data: Right IJ line placement.   PORTABLE CHEST - 1 VIEW  Comparison: None.  Findings: The tip of a right IJ line projects at or above the right cavoatrial junction.  Heart size is normal.  Lung volumes are low with mild bibasilar atelectasis.  There is no pneumothorax.  The visualized soft tissues and bony thorax are unremarkable.  IMPRESSION:  1.  Satisfactory positioning of a right IJ line at the level of the cavoatrial junction. 2.  No pneumothorax. 3.  Low lung volumes and mild bibasilar airspace disease, likely reflecting atelectasis.   Original Report Authenticated By: San Morelle, M.D.    Scheduled Meds: . ampicillin-sulbactam (UNASYN) IV  3 g Intravenous Q6H  . docusate sodium  100 mg Oral BID  . heparin  5,000 Units Subcutaneous Q8H  . HYDROmorphone PCA 0.3 mg/mL   Intravenous Q4H  . insulin aspart  0-20 Units Subcutaneous Q4H  . insulin detemir  45 Units Subcutaneous Daily  . levofloxacin (LEVAQUIN) IV  500 mg Intravenous Q24H  . nicotine  14 mg Transdermal Daily  . pantoprazole  40 mg Oral Daily   Continuous Infusions: . sodium chloride 20 mL/hr at 10/20/12 0009  . lactated ringers 50 mL/hr at 10/20/12 [id number]    Principal Problem:   Abscess Active Problems:   Drug abuse, IV   DM type 2, uncontrolled, with renal complications   HTN (hypertension)   Chronic kidney disease, stage 3   Leukocytosis   Anemia in chronic kidney disease    Time spent: 40 minutes   [facility] Pager [phone]. If 8PM-8AM, please contact night-coverage at [email or web address], password [facility] 10/22/2012, 8:04 AM  LOS: 9 days

## 2012-10-23 LAB — GLUCOSE, CAPILLARY
Glucose-Capillary: 116 mg/dL — ABNORMAL HIGH (ref 70–99)
Glucose-Capillary: 165 mg/dL — ABNORMAL HIGH (ref 70–99)
Glucose-Capillary: 165 mg/dL — ABNORMAL HIGH (ref 70–99)
Glucose-Capillary: 257 mg/dL — ABNORMAL HIGH (ref 70–99)

## 2012-10-23 LAB — BASIC METABOLIC PANEL
BUN: 21 mg/dL (ref 6–23)
GFR calc non Af Amer: 51 mL/min — ABNORMAL LOW (ref >90)
Glucose, Bld: 268 mg/dL — ABNORMAL HIGH (ref 70–99)
Potassium: 4.1 mEq/L (ref 3.5–5.1)

## 2012-10-23 LAB — CBC
HCT: 25.7 % — ABNORMAL LOW (ref 39.0–52.0)
Hemoglobin: 8.9 g/dL — ABNORMAL LOW (ref 13.0–17.0)
MCH: 29.5 pg (ref 26.0–34.0)
MCHC: 34.6 g/dL (ref 30.0–36.0)

## 2012-10-23 MED ORDER — OXYCODONE HCL 10 MG PO TABS: 10.0000 mg | ORAL_TABLET | Freq: Four times a day (QID) | ORAL | Status: DC | PRN

## 2012-10-23 MED ORDER — OXYCODONE HCL 5 MG PO TABS
10.0000 mg | ORAL_TABLET | Freq: Four times a day (QID) | ORAL | Status: DC | PRN
  Administered 2012-10-23 (×2): 10 mg via ORAL
  Filled 2012-10-23 (×2): qty 2

## 2012-10-23 MED ORDER — MORPHINE SULFATE ER 15 MG PO TBCR
60.0000 mg | EXTENDED_RELEASE_TABLET | Freq: Two times a day (BID) | ORAL | Status: DC
  Administered 2012-10-23: 60 mg via ORAL
  Filled 2012-10-23: qty 4

## 2012-10-23 MED ORDER — LEVOFLOXACIN 500 MG PO TABS: 500.0000 mg | ORAL_TABLET | Freq: Every day | ORAL | Status: AC

## 2012-10-23 MED ORDER — OXYMORPHONE HCL ER 20 MG PO TB12: 20.0000 mg | ORAL_TABLET | Freq: Two times a day (BID) | ORAL | Status: DC

## 2012-10-23 MED ORDER — NICOTINE 14 MG/24HR TD PT24: 1.0000 | MEDICATED_PATCH | Freq: Every day | TRANSDERMAL | Status: DC

## 2012-10-23 MED ORDER — INSULIN ASPART 100 UNIT/ML ~~LOC~~ SOLN
6.0000 [IU] | Freq: Three times a day (TID) | SUBCUTANEOUS | Status: DC
  Administered 2012-10-23: 6 [IU] via SUBCUTANEOUS

## 2012-10-23 MED ORDER — DOXYCYCLINE HYCLATE 50 MG PO CAPS: 100.0000 mg | ORAL_CAPSULE | Freq: Two times a day (BID) | ORAL | Status: AC

## 2012-10-23 NOTE — Progress Notes (Signed)
[facility] PROGRESS NOTE  Luke [name] [id number] DOB: September 30, 1980 DOA: 10/13/2012 PCP: Default, Provider, MD  Assessment/Plan: Principal Problem:   Abscess Active Problems:   Drug abuse, IV   DM type 2, uncontrolled, with renal complications   HTN (hypertension)   Chronic kidney disease, stage 3   Leukocytosis   Anemia in chronic kidney disease    Interim summary:  Patient is a 32 year old African American male with past medical history of anxiety, diabetes mellitus secondary renal disease and IV drug abuse who had been recently hospitalized for infection and abscess of his left upper extremity leading to wound debridement and on IV antibiotics. He had left AMA but was urged by his surgeon to come back. He was re-admitted on 7/29 with fever and pain/swelling of his left arm.  Since admission, the patient has had 5 debridements done in the operating room. He has been continued on IV Unasyn with decline overall and his white count, with some persistent elevation. He is required significant pain control using Dilaudid PCA. Patient also came in acute renal failure with a creatinine of 2.6 on readmission versus baseline of 1.6. Following hydration and discontinuation of vancomycin, creatinine has come down and is closer to his baseline now. Patient's course was also complicated by anemia requiring transfusion on 8/3. This was felt to be secondary to chronic renal disease and underlying infection. Plan is to continue IV antibiotics for another 2 days and discharged the patient on Wednesday or Thursday with by mouth antibiotics. In the interim, we'll plan to taper down patient's pain medications.   Assessment/Plan:  Principal Problem:  Abscess: Status post 5 debridements, orthopedics recommends IV antibiotics for another 24 hours, not a good candidate for IV antibiotics at home, in review of all wound cultures, only thing that has grown out his coag-negative staph pan sensitive.. Repeat I  & D done yesterday .  Needs followup in 5-7 days Not a candidate for IV antibiotics for home because of history of drug abuse Eventually DC home on by mouth Levaquin and doxycycline on doxycycline and Augmentin Awaiting culture from repeat debridements  Anemia: Likely chronic disease from renal dysfunction. Patient's hemoglobin improved after 2 units packed red blood cells transfused on Sunday. Recheck hemoglobin 8.9..    Drug abuse, IV: have counseled patient. Please note that his family is unaware of the drugs the patient has been abusing. PCA discontinued, start MS Contin 60 every 12 hours (patient is normally on Opana every 12 hours at home & hospital equivalent is MS Contin 60 mg every 12 hours) + and when necessary IV pain medicine. Is being discharged on 8/8, discharged on Opana 30mg  q 12 + by mouth Oxy IR.    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 Added NovoLog 6 units pre-meal, CBG still markedly elevated. Levemir may need to be titrated down as outpatient  HTN (hypertension): Elevated blood pressures, secondary to pain an& withdrawal, slowly improving    Chronic kidney disease stage 2-looks to be at baseline. Slight improvement   Leukocytosis-improving with debridement. change patient's antibiotics to see if his white count improves   IV access: Given renal dysfunction, reluctant to use PICC line. Critical care placed a central line on Sunday. PICC line will need to be discontinued prior to discharge    Code Status: Full  Family Communication: Spoke with patient's mother at the bedsideYesterday  Disposition Plan: Home eventually  Consultants:  Dr. Carollee Leitz surgery  Procedures:  Status post irrigation and debridement of left forearm on 7/29, repeat on 8/1 And again on 8/4  PICC line placed 8/3 Transfusion of 2 units packed red blood cells on 8/3  Antibiotics:  Patient received one  dose of IV Cipro and vancomycin in the emergency room  He has been on IV Unasyn and Levaquin since   HPI/Subjective: No complaints   Objective: Filed Vitals:   10/22/12 2221 10/23/12 0000 10/23/12 0400 10/23/12 0500  BP: 164/88   134/75  Pulse: 84   72  Temp: 98.4 F (36.9 C)   98.3 F (36.8 C)  TempSrc:      Resp: 18 18 18 18   Height:      Weight:      SpO2: 100% 100% 100% 100%    Intake/Output Summary (Last 24 hours) at 10/23/12 0810 Last data filed at 10/23/12 0500  Gross per 24 hour  Intake   1600 ml  Output    850 ml  Net    750 ml    Exam:  General: No acute distress, alert and oriented x3, overall feeling better Cardiovascular: Regular rate and rhythm, S1-S2 Respiratory: Clear to auscultation bilaterally Abdomen: Soft, NT, ND +BS Musculoskeletal: Left UE wrapped, tender    Data Reviewed: Basic Metabolic Panel:  Recent Labs Lab 10/18/12 1946 10/20/12 0500 10/21/12 0500 10/22/12 0500 10/23/12 0500  NA 139 140 138 138 138  K 3.4* 3.6 3.8 3.9 4.1  CL 101 99 98 101 100  CO2 26 31 29 27 28   GLUCOSE 189* 251* 249* 212* 268*  BUN 13 13 18 18 21   CREATININE 1.78* 1.82* 1.79* 1.72* 1.71*  CALCIUM 8.7 8.9 8.6 8.7 8.9    Liver Function Tests: No results found for this basename: AST, ALT, ALKPHOS, BILITOT, PROT, ALBUMIN,  in the last 168 hours No results found for this basename: LIPASE, AMYLASE,  in the last 168 hours No results found for this basename: AMMONIA,  in the last 168 hours  CBC:  Recent Labs Lab 10/19/12 0458 10/20/12 0500 10/21/12 0500 10/22/12 0500 10/23/12 0500  WBC 12.7* 13.7* 15.2* 14.9* 14.9*  HGB 7.0* 10.0* 9.0* 8.7* 8.9*  HCT 20.1* 28.5* 25.7* 25.6* 25.7*  MCV 81.4 83.1 84.3 85.0 85.1  PLT 751* 755* 696* 665* 713*    Cardiac Enzymes: No results found for this basename: CKTOTAL, CKMB, CKMBINDEX, TROPONINI,  in the last 168 hours BNP (last 3 results) No results found for this basename: PROBNP,  in the last 8760  hours   CBG:  Recent Labs Lab 10/22/12 1655 10/22/12 1958 10/23/12 0019 10/23/12 0459 10/23/12 0736  GLUCAP 229* 138* 165* 257* 116*    Recent Results (from the past 240 hour(s))  SURGICAL PCR SCREEN     Status: None   Collection Time    10/20/12  2:10 AM      Result Value Range Status   MRSA, PCR NEGATIVE  NEGATIVE Final   Staphylococcus aureus NEGATIVE  NEGATIVE Final   Comment:            The Xpert SA Assay (FDA     approved for NASAL specimens     in patients over 81 years of age),     is one component of     a comprehensive surveillance     program.  Test performance has     been validated by [employer] for patients greater     than or equal to 1  year old.     It is not intended     to diagnose infection nor to     guide or monitor treatment.     Studies: US Renal  10/13/2012   *RADIOLOGY REPORT*  Clinical Data: Acute renal insufficiency.  RENAL/URINARY TRACT ULTRASOUND COMPLETE  Comparison:  No priors.  Findings:  Right Kidney:  No hydronephrosis.  Well-preserved cortex.  Normal size and parenchymal echotexture without focal abnormalities.  10.9 cm in length.  Left Kidney:  No hydronephrosis.  Well-preserved cortex.  Normal size and parenchymal echotexture without focal abnormalities.  11.7 cm in length.  Bladder:  Well distended without focal neural abnormalities.  IMPRESSION: 1.  Normal sonographic appearance of the kidneys and urinary bladder.   Original Report Authenticated By: Vinnie Langton, M.D.   Dg Chest Port 1 View  10/19/2012   *RADIOLOGY REPORT*  Clinical Data: Right IJ line placement.  PORTABLE CHEST - 1 VIEW  Comparison: None.  Findings: The tip of a right IJ line projects at or above the right cavoatrial junction.  Heart size is normal.  Lung volumes are low with mild bibasilar atelectasis.  There is no pneumothorax.  The visualized soft tissues and bony thorax are unremarkable.  IMPRESSION:  1.  Satisfactory positioning of a right IJ line at the  level of the cavoatrial junction. 2.  No pneumothorax. 3.  Low lung volumes and mild bibasilar airspace disease, likely reflecting atelectasis.   Original Report Authenticated By: San Morelle, M.D.    Scheduled Meds: . ampicillin-sulbactam (UNASYN) IV  3 g Intravenous Q6H  . docusate sodium  100 mg Oral BID  . heparin  5,000 Units Subcutaneous Q8H  . insulin aspart  0-20 Units Subcutaneous Q4H  . insulin detemir  50 Units Subcutaneous Daily  . levofloxacin (LEVAQUIN) IV  500 mg Intravenous Q24H  . morphine  60 mg Oral Q12H  . nicotine  14 mg Transdermal Daily  . pantoprazole  40 mg Oral Daily   Continuous Infusions: . sodium chloride 20 mL/hr (10/22/12 2123)  . lactated ringers 50 mL/hr at 10/20/12 [id number]    Principal Problem:   Abscess Active Problems:   Drug abuse, IV   DM type 2, uncontrolled, with renal complications   HTN (hypertension)   Chronic kidney disease, stage 3   Leukocytosis   Anemia in chronic kidney disease    Time spent: 40 minutes   [facility] Pager [phone]. If 8PM-8AM, please contact night-coverage at [email or web address], password [facility] 10/23/2012, 8:10 AM  LOS: 10 days

## 2012-10-23 NOTE — Progress Notes (Signed)
Patient discharged to home accompanied by wife. Discharge instructions and rx given and explained and patient stated understanding. Patients central line was removed by IV team prior to discharge. Patient left unit in a stable condition via wheelchair.

## 2012-10-23 NOTE — Progress Notes (Signed)
PATIENT SEEN/EXAMINED LUE: FINGERS WARM WELL PERFUSED LIMITED DIGITAL MOBILITY SUTURES LINE INTACT NO GROSS PURULENCE  PLAN: OK TO GO HOME WILL SEE BACK IN OFFICE ON Tuesday KEEP SPLINT ON AT ALL TIMES KEEP HAND ELEVATED

## 2012-10-23 NOTE — Progress Notes (Signed)
Orthopedic Tech Progress Note Patient Details:  Luke [name] 1981/03/06 [id number]  Ortho Devices Type of Ortho Device: Arm sling Ortho Device/Splint Location: LUE Ortho Device/Splint Interventions: Ordered;Application   Braulio Bosch 10/23/2012, 7:00 PM

## 2012-10-23 NOTE — Discharge Summary (Signed)
Patient left AMA after he was not allowed to smoke int he bathroom.  He was counseled extensively on the risks of leaving AMA. Eulogio Bear DO

## 2012-11-05 NOTE — Discharge Summary (Signed)
Brief  summary:  Patient is a 32 year old African American male with past medical history of anxiety, diabetes mellitus secondary renal disease and IV drug abuse who had been recently hospitalized for infection and abscess of his left upper extremity leading to wound debridement and on IV antibiotics. He had left AMA but was urged by his surgeon to come back. He was re-admitted on 7/29 with fever and pain/swelling of his left arm.  Since admission, the patient has had 5 debridements done in the operating room. He has been continued on IV Unasyn with decline overall and his white count, with some persistent elevation. He is required significant pain control using Dilaudid PCA. Patient also came in acute renal failure with a creatinine of 2.6 on readmission versus baseline of 1.6. Following hydration and discontinuation of vancomycin, creatinine has come down and is closer to his baseline now. Patient's course was also complicated by anemia requiring transfusion on 8/3. This was felt to be secondary to chronic renal disease and underlying infection. Plan is to continue IV antibiotics for another 2 days and discharged the patient on Wednesday or Thursday with by mouth antibiotics.    Assessment/Plan:  Principal Problem:  Abscess: Status post 5 debridements, orthopedics recommends IV antibiotics for another 24 hours, not a good candidate for IV antibiotics at home, in review of all wound cultures, only thing that has grown out his coag-negative staph pan sensitive.. Repeat I & D done yesterday .  Needs followup in 5-7 days  Not a candidate for IV antibiotics for home because of history of drug abuse  Eventually DC home on by mouth Levaquin and doxycycline or  doxycycline and Augmentin  Awaiting culture from repeat debridements . Patient was very anxious to get discharged   Anemia: Likely chronic disease from renal dysfunction. Patient's hemoglobin improved after 2 units packed red blood cells transfused on  Sunday. Recheck hemoglobin 8.9..   Drug abuse, IV: have counseled patient. Please note that his family is unaware of the drugs the patient has been abusing. PCA discontinued, started  MS Contin 60 every 12 hours  Nut patient refused to continue it , prefers Opana and  discharged on Opana 30mg  q 12 + by mouth Oxy IR.    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 Added NovoLog 6 units pre-meal, CBG still markedly elevated. Levemir may need to be titrated down as outpatient   HTN (hypertension): Elevated blood pressures, secondary to pain an& withdrawal, slowly improving   Chronic kidney disease stage 2-looks to be at baseline. Slight improvement   Leukocytosis-improving with debridement. change patient's antibiotics to see if his white count improves   IV access: Given renal dysfunction, reluctant to use PICC line. Critical care placed a central line on Sunday. This was discontinued prior to DC   Code Status: Full  Family Communication: Spoke with patient's mother at the bedsideYesterday  Disposition Plan: patient anxious to go and demanding discharge  Consultants:  Dr. Carollee Leitz surgery Procedures:  Status post irrigation and debridement of left forearm on 7/29, repeat on 8/1 And again on 8/4  PICC line placed 8/3 Transfusion of 2 units packed red blood cells on 8/3  Antibiotics:  Patient received one dose of IV Cipro and vancomycin in the emergency room  He has been on IV Unasyn and Levaquin since

## 2012-11-24 LAB — AFB CULTURE WITH SMEAR (NOT AT ARMC)
Acid Fast Smear: NONE SEEN
Acid Fast Smear: NONE SEEN

## 2013-03-04 ENCOUNTER — Ambulatory Visit: Payer: Medicaid Other | Attending: Family Medicine

## 2013-06-13 ENCOUNTER — Encounter (HOSPITAL_COMMUNITY): Payer: Self-pay | Admitting: Emergency Medicine

## 2013-06-13 ENCOUNTER — Emergency Department (HOSPITAL_COMMUNITY)
Admission: EM | Admit: 2013-06-13 | Discharge: 2013-06-13 | Disposition: A | Discharge status: Home / Self Care | Payer: Medicaid Other | Attending: Emergency Medicine | Admitting: Emergency Medicine

## 2013-06-13 ENCOUNTER — Emergency Department (HOSPITAL_COMMUNITY): Payer: Medicaid Other

## 2013-06-13 DIAGNOSIS — E860 Dehydration: Secondary | ICD-10-CM | POA: Insufficient documentation

## 2013-06-13 DIAGNOSIS — R7989 Other specified abnormal findings of blood chemistry: Secondary | ICD-10-CM | POA: Insufficient documentation

## 2013-06-13 DIAGNOSIS — R945 Abnormal results of liver function studies: Secondary | ICD-10-CM

## 2013-06-13 DIAGNOSIS — Z862 Personal history of diseases of the blood and blood-forming organs and certain disorders involving the immune mechanism: Secondary | ICD-10-CM | POA: Insufficient documentation

## 2013-06-13 DIAGNOSIS — R569 Unspecified convulsions: Secondary | ICD-10-CM | POA: Insufficient documentation

## 2013-06-13 DIAGNOSIS — R000 Tachycardia, unspecified: Secondary | ICD-10-CM | POA: Insufficient documentation

## 2013-06-13 DIAGNOSIS — IMO0002 Reserved for concepts with insufficient information to code with codable children: Secondary | ICD-10-CM

## 2013-06-13 DIAGNOSIS — N189 Chronic kidney disease, unspecified: Secondary | ICD-10-CM | POA: Insufficient documentation

## 2013-06-13 DIAGNOSIS — F411 Generalized anxiety disorder: Secondary | ICD-10-CM | POA: Insufficient documentation

## 2013-06-13 DIAGNOSIS — F1410 Cocaine abuse, uncomplicated: Secondary | ICD-10-CM | POA: Insufficient documentation

## 2013-06-13 DIAGNOSIS — F17200 Nicotine dependence, unspecified, uncomplicated: Secondary | ICD-10-CM | POA: Insufficient documentation

## 2013-06-13 DIAGNOSIS — Z79899 Other long term (current) drug therapy: Secondary | ICD-10-CM | POA: Insufficient documentation

## 2013-06-13 DIAGNOSIS — R55 Syncope and collapse: Secondary | ICD-10-CM | POA: Insufficient documentation

## 2013-06-13 DIAGNOSIS — K219 Gastro-esophageal reflux disease without esophagitis: Secondary | ICD-10-CM | POA: Insufficient documentation

## 2013-06-13 DIAGNOSIS — IMO0001 Reserved for inherently not codable concepts without codable children: Secondary | ICD-10-CM | POA: Insufficient documentation

## 2013-06-13 DIAGNOSIS — Z8701 Personal history of pneumonia (recurrent): Secondary | ICD-10-CM | POA: Insufficient documentation

## 2013-06-13 DIAGNOSIS — Z794 Long term (current) use of insulin: Secondary | ICD-10-CM | POA: Insufficient documentation

## 2013-06-13 DIAGNOSIS — E1165 Type 2 diabetes mellitus with hyperglycemia: Secondary | ICD-10-CM

## 2013-06-13 DIAGNOSIS — R739 Hyperglycemia, unspecified: Secondary | ICD-10-CM

## 2013-06-13 LAB — CBC WITH DIFFERENTIAL/PLATELET
BASOS ABS: 0 10*3/uL (ref 0.0–0.1)
BASOS PCT: 0 % (ref 0–1)
EOS ABS: 0.2 10*3/uL (ref 0.0–0.7)
EOS PCT: 1 % (ref 0–5)
HCT: 36.5 % — ABNORMAL LOW (ref 39.0–52.0)
Hemoglobin: 12.9 g/dL — ABNORMAL LOW (ref 13.0–17.0)
LYMPHS ABS: 2.7 10*3/uL (ref 0.7–4.0)
Lymphocytes Relative: 17 % (ref 12–46)
MCH: 29.5 pg (ref 26.0–34.0)
MCHC: 35.3 g/dL (ref 30.0–36.0)
MCV: 83.3 fL (ref 78.0–100.0)
Monocytes Absolute: 1 10*3/uL (ref 0.1–1.0)
Monocytes Relative: 6 % (ref 3–12)
NEUTROS PCT: 76 % (ref 43–77)
Neutro Abs: 12.7 10*3/uL — ABNORMAL HIGH (ref 1.7–7.7)
PLATELETS: 396 10*3/uL (ref 150–400)
RBC: 4.38 MIL/uL (ref 4.22–5.81)
RDW: 13.2 % (ref 11.5–15.5)
WBC: 16.6 10*3/uL — ABNORMAL HIGH (ref 4.0–10.5)

## 2013-06-13 LAB — CBG MONITORING, ED
GLUCOSE-CAPILLARY: 262 mg/dL — AB (ref 70–99)
Glucose-Capillary: 312 mg/dL — ABNORMAL HIGH (ref 70–99)

## 2013-06-13 LAB — COMPREHENSIVE METABOLIC PANEL
ALBUMIN: 3.9 g/dL (ref 3.5–5.2)
ALK PHOS: 128 U/L — AB (ref 39–117)
ALT: 55 U/L — AB (ref 0–53)
AST: 110 U/L — AB (ref 0–37)
BUN: 20 mg/dL (ref 6–23)
CALCIUM: 9.1 mg/dL (ref 8.4–10.5)
CO2: 22 mEq/L (ref 19–32)
Chloride: 97 mEq/L (ref 96–112)
Creatinine, Ser: 1.37 mg/dL — ABNORMAL HIGH (ref 0.50–1.35)
GFR calc non Af Amer: 67 mL/min — ABNORMAL LOW (ref >90)
GFR, EST AFRICAN AMERICAN: 77 mL/min — AB (ref >90)
Glucose, Bld: 318 mg/dL — ABNORMAL HIGH (ref 70–99)
POTASSIUM: 4.3 meq/L (ref 3.7–5.3)
SODIUM: 136 meq/L — AB (ref 137–147)
TOTAL PROTEIN: 7.1 g/dL (ref 6.0–8.3)
Total Bilirubin: <0.2 mg/dL — ABNORMAL LOW (ref 0.3–1.2)

## 2013-06-13 LAB — RAPID URINE DRUG SCREEN, HOSP PERFORMED
AMPHETAMINES: NOT DETECTED
BARBITURATES: NOT DETECTED
BENZODIAZEPINES: NOT DETECTED
Cocaine: POSITIVE — AB
Opiates: POSITIVE — AB
TETRAHYDROCANNABINOL: NOT DETECTED

## 2013-06-13 LAB — URINALYSIS, ROUTINE W REFLEX MICROSCOPIC
BILIRUBIN URINE: NEGATIVE
Glucose, UA: >1000 mg/dL — AB
KETONES UR: 15 mg/dL — AB
Leukocytes, UA: NEGATIVE
NITRITE: NEGATIVE
PH: 5.5 (ref 5.0–8.0)
PROTEIN: 30 mg/dL — AB
Specific Gravity, Urine: 1.038 — ABNORMAL HIGH (ref 1.005–1.030)
Urobilinogen, UA: 0.2 mg/dL (ref 0.0–1.0)

## 2013-06-13 LAB — URINE MICROSCOPIC-ADD ON

## 2013-06-13 MED ORDER — SODIUM CHLORIDE 0.9 % IV BOLUS (SEPSIS)
1000.0000 mL | Freq: Once | INTRAVENOUS | Status: AC
  Administered 2013-06-13: 1000 mL via INTRAVENOUS

## 2013-06-13 MED ORDER — SODIUM CHLORIDE 0.9 % IV BOLUS (SEPSIS)
500.0000 mL | Freq: Once | INTRAVENOUS | Status: AC
  Administered 2013-06-13: 500 mL via INTRAVENOUS

## 2013-06-13 MED ORDER — LORAZEPAM 2 MG/ML IJ SOLN
1.0000 mg | Freq: Once | INTRAMUSCULAR | Status: AC
  Administered 2013-06-13: 1 mg via INTRAVENOUS
  Filled 2013-06-13: qty 1

## 2013-06-13 NOTE — Discharge Instructions (Signed)
Please avoid drug use as we discussed. Stay hydrated with water. No driving or operating machinery or swimming until cleared by a neurologist, very important! If you were given medicines take as directed.  If you are on coumadin or contraceptives realize their levels and effectiveness is altered by many different medicines.  If you have any reaction (rash, tongues swelling, other) to the medicines stop taking and see a physician.   Please discuss with local physician for improved diabetes management.  Please follow up as directed and return to the ER or see a physician for new or worsening symptoms.  Thank you. Filed Vitals:   06/13/13 0430 06/13/13 0500 06/13/13 0530 06/13/13 0547  BP: 124/65 121/70 115/67   Pulse: 101 100 100   Temp:    97.3 F (36.3 C)  TempSrc:    Oral  Resp:      SpO2: 97% 98% 97%

## 2013-06-13 NOTE — ED Notes (Signed)
Pt. States, "had a seizure; wife said so." hx. Of a seizure x 5 years ago." pt. Was sitting on the toilet to bm, and then became on responsive. No incontinency, no oral injuries, abrasion on rt. Lateral side of face, and bit inside of tongue.  cbg in triage 312.

## 2013-06-13 NOTE — ED Provider Notes (Signed)
CSN: [id number]     Arrival date & time 06/13/13  0100 History   First MD Initiated Contact with Patient 06/13/13 0133     Chief Complaint  Patient presents with  . Loss of Consciousness  . Seizures     (Consider location/radiation/quality/duration/timing/severity/associated sxs/prior Treatment) HPI Comments: 33 year old male with history of uncontrolled diabetes, chronic kidney disease, anemia, drug abuse, smoker, high blood pressure, cocaine use presents after a witnessed generalized seizure prior to arrival. Patient had similar episode roughly 5 years ago, is not on seizure medications. He drinks alcohol occasionally and uses Ativan helps sleep occasionally at night. Patient admits to using cocaine this evening prior to seizure episode. Patient's glucose is normal around the 200s, he takes 70-30 a.m. and p.m. and no recent change in medicines. Patient was on the toilet during his seizure and it was brief lasting a few minutes.  Patient is a 33 y.o. male presenting with syncope and seizures. The history is provided by the patient.  Loss of Consciousness Associated symptoms: seizures   Associated symptoms: no chest pain, no fever, no headaches, no shortness of breath and no vomiting   Seizures   Past Medical History  Diagnosis Date  . Diabetes mellitus without complication   . Anxiety   . Pneumonia   . GERD (gastroesophageal reflux disease)   . Seizures   . [id number])    Past Surgical History  Procedure Laterality Date  . Irrigation and debridement abscess      Hx: of left arm  . I&d extremity Left 10/11/2012    Procedure: IRRIGATION AND DEBRIDEMENT ABSCESS FOREARM;  Surgeon: Linna Hoff, MD;  Location: [place];  Service: Orthopedics;  Laterality: Left;  . I&d extremity Left 10/12/2012    Procedure: IRRIGATION AND DEBRIDEMENT FOREARM;  Surgeon: Linna Hoff, MD;  Location: [place];  Service: Orthopedics;  Laterality: Left;  . I&d extremity Left 10/14/2012    Procedure:  incision and drainage left forearm;  Surgeon: Linna Hoff, MD;  Location: [place];  Service: Orthopedics;  Laterality: Left;  . I&d extremity Left 10/16/2012    Procedure: IRRIGATION AND DEBRIDEMENT LEFT FOREARM;  Surgeon: Linna Hoff, MD;  Location: [place];  Service: Orthopedics;  Laterality: Left;  . I&d extremity Left 10/20/2012    Procedure: INCISION AND DRAINAGE AND DEBRIDEMENT LEFT  FOREARM;  Surgeon: Linna Hoff, MD;  Location: [place];  Service: Orthopedics;  Laterality: Left;   Family History  Problem Relation Age of Onset  . Diabetes Father   . Heart disease Father   . Mental illness Sister    History  Substance Use Topics  . Smoking status: Current Every Day Smoker -- 1.00 packs/day    Types: Cigarettes  . Smokeless tobacco: Never Used  . Alcohol Use: Yes     Comment: occasional    Review of Systems  Constitutional: Negative for fever and chills.  HENT: Negative for congestion.   Eyes: Negative for visual disturbance.  Respiratory: Negative for shortness of breath.   Cardiovascular: Positive for syncope. Negative for chest pain.  Gastrointestinal: Negative for vomiting and abdominal pain.  Genitourinary: Negative for dysuria and flank pain.  Musculoskeletal: Negative for back pain, neck pain and neck stiffness.  Skin: Negative for rash.  Neurological: Positive for seizures. Negative for light-headedness and headaches.      Allergies  Review of patient's allergies indicates no known allergies.  Home Medications   Current Outpatient Rx  Name  Route  Sig  Dispense  Refill  . alprazolam (XANAX) 2 MG tablet   Oral   Take 1-2 mg by mouth 2 (two) times daily as needed for sleep or anxiety.         Marland Kitchen esomeprazole (NEXIUM) 40 MG capsule   Oral   Take 40 mg by mouth daily before breakfast.         . insulin aspart (NOVOLOG) 100 UNIT/ML injection   Subcutaneous   Inject 4-16 Units into the skin 3 (three) times daily with meals. *per sliding scale          . insulin aspart protamine- aspart (NOVOLOG MIX 70/30) (70-30) 100 UNIT/ML injection   Subcutaneous   Inject 45-55 Units into the skin 2 (two) times daily with a meal. Uses 55 units in the morning and 45 units at night         . lisinopril (PRINIVIL,ZESTRIL) 10 MG tablet   Oral   Take 10 mg by mouth daily.         . nicotine (NICODERM CQ - DOSED IN MG/24 HOURS) 14 mg/24hr patch   Transdermal   Place 1 patch onto the skin daily.   28 patch   0   . oxyCODONE 10 MG TABS   Oral   Take 1 tablet (10 mg total) by mouth every 6 (six) hours as needed.   30 tablet   0   . oxymorphone (OPANA ER) 20 MG 12 hr tablet   Oral   Take 1 tablet (20 mg total) by mouth every 12 (twelve) hours.   30 tablet   0    BP 153/89  Pulse 138  Resp 18  SpO2 99% Physical Exam  Nursing note and vitals reviewed. Constitutional: He is oriented to person, place, and time. He appears well-developed and well-nourished.  HENT:  Head: Normocephalic and atraumatic.  Mild dry mm  Eyes: Conjunctivae are normal. Right eye exhibits no discharge. Left eye exhibits no discharge.  Neck: Normal range of motion. Neck supple. No tracheal deviation present.  Cardiovascular: Regular rhythm.  Tachycardia present.   Pulmonary/Chest: Effort normal and breath sounds normal.  Abdominal: Soft. He exhibits no distension. There is no tenderness. There is no guarding.  Musculoskeletal: He exhibits no edema.  Neurological: He is alert and oriented to person, place, and time.  No meningismus, neck supple 5+ strength in UE and LE with f/e at major joints. Sensation to palpation intact in UE and LE. CNs 2-12 grossly intact.  EOMFI.  PERRL.   Finger nose and coordination intact bilateral.   Visual fields intact to finger testing.   Skin: Skin is warm. No rash noted.  Scarring bilateral antecubitals  Psychiatric: He has a normal mood and affect.    ED Course  Procedures (including critical care time) Emergency  Ultrasound Study:   Angiocath insertion Performed by: Mariea Clonts  Consent: Verbal consent obtained. Risks and benefits: risks, benefits and alternatives were discussed Immediately prior to procedure the correct patient, procedure, equipment, support staff and site/side marked as needed.  Indication: difficult IV access Preparation: Patient was prepped and draped in the usual sterile fashion. Vein Location: right ac vein was visualized during assessment for potential access sites and was found to be patent/ easily compressed with linear ultrasound.  The needle was visualized with real-time ultrasound and guided into the vein. Gauge: 18 g  Image saved and stored.  Normal blood return.  Patient tolerance: Patient tolerated the procedure well with no immediate complications.  Labs Review Labs Reviewed  CBC WITH DIFFERENTIAL - Abnormal; Notable for the following:    WBC 16.6 (*)    Hemoglobin 12.9 (*)    HCT 36.5 (*)    Neutro Abs 12.7 (*)    All other components within normal limits  COMPREHENSIVE METABOLIC PANEL - Abnormal; Notable for the following:    Sodium 136 (*)    Glucose, Bld 318 (*)    Creatinine, Ser 1.37 (*)    AST 110 (*)    ALT 55 (*)    Alkaline Phosphatase 128 (*)    Total Bilirubin <0.2 (*)    GFR calc non Af Amer 67 (*)    GFR calc Af Amer 77 (*)    All other components within normal limits  URINE RAPID DRUG SCREEN (HOSP PERFORMED) - Abnormal; Notable for the following:    Opiates POSITIVE (*)    Cocaine POSITIVE (*)    All other components within normal limits  CBG MONITORING, ED - Abnormal; Notable for the following:    Glucose-Capillary 312 (*)    All other components within normal limits  CBG MONITORING, ED - Abnormal; Notable for the following:    Glucose-Capillary 262 (*)    All other components within normal limits  URINALYSIS, ROUTINE W REFLEX MICROSCOPIC   Imaging Review Ct Head Wo Contrast  06/13/2013   CLINICAL DATA:  Loss of  consciousness.  Seizures  EXAM: CT HEAD WITHOUT CONTRAST  TECHNIQUE: Contiguous axial images were obtained from the base of the skull through the vertex without intravenous contrast.  COMPARISON:  None.  FINDINGS: Skull and Sinuses:No significant abnormality.  Orbits: No acute abnormality.  Brain: No evidence of acute abnormality, such as acute infarction, hemorrhage, hydrocephalus, or mass lesion/mass effect. Borderline low cerebral volume.  IMPRESSION: No evidence of acute intracranial disease.   Electronically Signed   By: Jorje Guild M.D.   On: 06/13/2013 03:40     EKG Interpretation   Date/Time:  Saturday June 13 2013 01:05:40 EDT Ventricular Rate:  138 PR Interval:  124 QRS Duration: 74 QT Interval:  300 QTC Calculation: 454 R Axis:   72 Text Interpretation:  Sinus tachycardia Otherwise normal ECG Confirmed by  Bradon Fester  MD, Charlize Hathaway ([id number]) on 06/13/2013 2:04:40 AM      MDM   Final diagnoses:  Seizure-like activity  Hyperglycemia  Diabetes type 2, uncontrolled  LFT elevation  Cocaine abuse Dehydration  Clinically patient had a witnessed generalized seizure, patient is not currently postictal. Seizure likely due to drug abuse. Plan for blood work, fluids, and CT head 2 to recent cocaine use and seizure. Plan for fluid bolus and Ativan with goal to improve heart rate. Patient has a history of and documentation of multiple visits on his cell phone with heart rates ranging between 90 and 130s. He said his heart rate is normally elevated however I do feel this is likely a component of withdrawal from medicine and cocaine abuse.  Patient asked the nurse myself to not discuss his cocaine and drug abuse in front of his significant other.  Patient observed in the ER for greater than 5 hours without seizure activity. Patient improved to baseline including vitals improved from a heart rate of 130s to heart rate of 90. Patient tolerated by mouth fluids and has a ride home. Discussed  importance of followup with neurology outpatient, refraining from drug use and no driving and operating machinery with seizure history.    I had a long discussion with the  patient regarding multiple abnormal findings on his workup to date. Patient is uncontrolled diabetes with a mild gap in the ED. Patient has had DKA before and says this feels nothing like it-- he is not vomiting he feels well currently. His mild gap acidosis could be do to dehydration/lactate secondary to drugs and seizures. I discussed and recommended observation in the hospital to follow his lab work, look for source of wbc elevation, continue fluids and improve his diabetic management. Patient says he will see his physician on Monday and his significant other will be with him to bring him back if he worsens.  He understands that if this is early diabetic ketoacidosis ischemic and very sick and his significant other is in the room and confirmed that she will monitor him closely and bring him back for worsening symptoms. He has elevated LFTs however no abdo  pain and no fever.   He does drink alcohol occasionally but not recently per patient.  Patient was given 1.5 L of IV fluids and oral fluids in the ED he was not able to produce a urine sample. I discussed his elevated white blood cell count which can be from a stress reaction due to his seizure but also could be from an infection. Clinically he does not have a source for infection on exam he did not have meningitis symptoms, do not appreciate any rash or abscess, the lungs are clear and he does not have a fever up.  I have recommended that he give [place] a urine sample or weight plate can give [place] a sample however patient is requesting to be discharged. Patient has the capacity to make decisions and he understands he can return at any time.    Pt was able to produce UA prior to dc, signed out to fup result and complete dispo.  Results and differential diagnosis were discussed with the  patient. Close follow up outpatient was discussed, patient comfortable with the plan.   Filed Vitals:   06/13/13 0430 06/13/13 0500 06/13/13 0530 06/13/13 0547  BP: 124/65 121/70 115/67   Pulse: 101 100 100   Temp:    97.3 F (36.3 C)  TempSrc:    Oral  Resp:      SpO2: 97% 98% 97%              Mariea Clonts, MD 06/13/13 [phone]

## 2013-06-13 NOTE — ED Notes (Signed)
Patient unable to void 

## 2013-06-13 NOTE — ED Notes (Signed)
Patient given urinal and encouraged to void

## 2013-06-13 NOTE — ED Notes (Signed)
Patient transported to CT 

## 2013-11-23 ENCOUNTER — Inpatient Hospital Stay (HOSPITAL_COMMUNITY)
Admission: EM | Admit: 2013-11-23 | Discharge: 2013-11-25 | DRG: 638 | Disposition: A | Discharge status: Home / Self Care | Payer: Medicaid Other | Attending: Pulmonary Disease | Admitting: Pulmonary Disease

## 2013-11-23 ENCOUNTER — Emergency Department (HOSPITAL_COMMUNITY): Payer: Medicaid Other

## 2013-11-23 ENCOUNTER — Encounter (HOSPITAL_COMMUNITY): Payer: Self-pay | Admitting: Emergency Medicine

## 2013-11-23 DIAGNOSIS — E875 Hyperkalemia: Secondary | ICD-10-CM | POA: Diagnosis present

## 2013-11-23 DIAGNOSIS — E1010 Type 1 diabetes mellitus with ketoacidosis without coma: Secondary | ICD-10-CM | POA: Diagnosis present

## 2013-11-23 DIAGNOSIS — F1910 Other psychoactive substance abuse, uncomplicated: Secondary | ICD-10-CM

## 2013-11-23 DIAGNOSIS — F17200 Nicotine dependence, unspecified, uncomplicated: Secondary | ICD-10-CM | POA: Diagnosis present

## 2013-11-23 DIAGNOSIS — E1065 Type 1 diabetes mellitus with hyperglycemia: Secondary | ICD-10-CM | POA: Diagnosis present

## 2013-11-23 DIAGNOSIS — E1110 Type 2 diabetes mellitus with ketoacidosis without coma: Secondary | ICD-10-CM | POA: Diagnosis present

## 2013-11-23 DIAGNOSIS — E1310 Other specified diabetes mellitus with ketoacidosis without coma: Secondary | ICD-10-CM

## 2013-11-23 DIAGNOSIS — L0291 Cutaneous abscess, unspecified: Secondary | ICD-10-CM

## 2013-11-23 DIAGNOSIS — N189 Chronic kidney disease, unspecified: Secondary | ICD-10-CM

## 2013-11-23 DIAGNOSIS — I10 Essential (primary) hypertension: Secondary | ICD-10-CM

## 2013-11-23 DIAGNOSIS — E1029 Type 1 diabetes mellitus with other diabetic kidney complication: Secondary | ICD-10-CM | POA: Diagnosis present

## 2013-11-23 DIAGNOSIS — Z8249 Family history of ischemic heart disease and other diseases of the circulatory system: Secondary | ICD-10-CM | POA: Diagnosis not present

## 2013-11-23 DIAGNOSIS — F1110 Opioid abuse, uncomplicated: Secondary | ICD-10-CM | POA: Diagnosis present

## 2013-11-23 DIAGNOSIS — F411 Generalized anxiety disorder: Secondary | ICD-10-CM | POA: Diagnosis present

## 2013-11-23 DIAGNOSIS — Z91199 Patient's noncompliance with other medical treatment and regimen due to unspecified reason: Secondary | ICD-10-CM | POA: Diagnosis not present

## 2013-11-23 DIAGNOSIS — IMO0002 Reserved for concepts with insufficient information to code with codable children: Secondary | ICD-10-CM | POA: Diagnosis present

## 2013-11-23 DIAGNOSIS — D631 Anemia in chronic kidney disease: Secondary | ICD-10-CM | POA: Diagnosis present

## 2013-11-23 DIAGNOSIS — Z794 Long term (current) use of insulin: Secondary | ICD-10-CM | POA: Diagnosis not present

## 2013-11-23 DIAGNOSIS — N039 Chronic nephritic syndrome with unspecified morphologic changes: Secondary | ICD-10-CM

## 2013-11-23 DIAGNOSIS — Z833 Family history of diabetes mellitus: Secondary | ICD-10-CM | POA: Diagnosis not present

## 2013-11-23 DIAGNOSIS — K219 Gastro-esophageal reflux disease without esophagitis: Secondary | ICD-10-CM | POA: Diagnosis present

## 2013-11-23 DIAGNOSIS — E1011 Type 1 diabetes mellitus with ketoacidosis with coma: Secondary | ICD-10-CM

## 2013-11-23 DIAGNOSIS — N183 Chronic kidney disease, stage 3 unspecified: Secondary | ICD-10-CM | POA: Diagnosis present

## 2013-11-23 DIAGNOSIS — D72829 Elevated white blood cell count, unspecified: Secondary | ICD-10-CM

## 2013-11-23 DIAGNOSIS — Z9119 Patient's noncompliance with other medical treatment and regimen: Secondary | ICD-10-CM

## 2013-11-23 DIAGNOSIS — N179 Acute kidney failure, unspecified: Secondary | ICD-10-CM | POA: Diagnosis present

## 2013-11-23 DIAGNOSIS — F1410 Cocaine abuse, uncomplicated: Secondary | ICD-10-CM | POA: Diagnosis present

## 2013-11-23 DIAGNOSIS — E1129 Type 2 diabetes mellitus with other diabetic kidney complication: Secondary | ICD-10-CM

## 2013-11-23 DIAGNOSIS — E1165 Type 2 diabetes mellitus with hyperglycemia: Secondary | ICD-10-CM

## 2013-11-23 DIAGNOSIS — L0390 Cellulitis, unspecified: Secondary | ICD-10-CM

## 2013-11-23 DIAGNOSIS — E860 Dehydration: Secondary | ICD-10-CM

## 2013-11-23 DIAGNOSIS — R112 Nausea with vomiting, unspecified: Secondary | ICD-10-CM | POA: Diagnosis present

## 2013-11-23 DIAGNOSIS — Z720 Tobacco use: Secondary | ICD-10-CM

## 2013-11-23 LAB — I-STAT CHEM 8, ED
BUN: 47 mg/dL — AB (ref 6–23)
CALCIUM ION: 1.03 mmol/L — AB (ref 1.12–1.23)
CREATININE: 3.5 mg/dL — AB (ref 0.50–1.35)
Chloride: 90 mEq/L — ABNORMAL LOW (ref 96–112)
HCT: 42 % (ref 39.0–52.0)
Hemoglobin: 14.3 g/dL (ref 13.0–17.0)
Potassium: 8.2 mEq/L (ref 3.7–5.3)
Sodium: 119 mEq/L — CL (ref 137–147)
TCO2: 9 mmol/L (ref 0–100)

## 2013-11-23 LAB — URINALYSIS, ROUTINE W REFLEX MICROSCOPIC
BILIRUBIN URINE: NEGATIVE
KETONES UR: 40 mg/dL — AB
Leukocytes, UA: NEGATIVE
Nitrite: NEGATIVE
PROTEIN: NEGATIVE mg/dL
Specific Gravity, Urine: 1.023 (ref 1.005–1.030)
Urobilinogen, UA: 0.2 mg/dL (ref 0.0–1.0)
pH: 5 (ref 5.0–8.0)

## 2013-11-23 LAB — BASIC METABOLIC PANEL WITH GFR
Anion gap: 21 — ABNORMAL HIGH (ref 5–15)
BUN: 40 mg/dL — ABNORMAL HIGH (ref 6–23)
CO2: 20 meq/L (ref 19–32)
Calcium: 9.3 mg/dL (ref 8.4–10.5)
Chloride: 98 meq/L (ref 96–112)
Creatinine, Ser: 2.37 mg/dL — ABNORMAL HIGH (ref 0.50–1.35)
GFR calc Af Amer: 40 mL/min — ABNORMAL LOW
GFR calc non Af Amer: 34 mL/min — ABNORMAL LOW
Glucose, Bld: 377 mg/dL — ABNORMAL HIGH (ref 70–99)
Potassium: 4.4 meq/L (ref 3.7–5.3)
Sodium: 139 meq/L (ref 137–147)

## 2013-11-23 LAB — CBC
HCT: 40.2 % (ref 39.0–52.0)
HCT: 34.9 % — ABNORMAL LOW (ref 39.0–52.0)
HEMOGLOBIN: 11.6 g/dL — AB (ref 13.0–17.0)
Hemoglobin: 11.4 g/dL — ABNORMAL LOW (ref 13.0–17.0)
MCH: 27.9 pg (ref 26.0–34.0)
MCH: 27.9 pg (ref 26.0–34.0)
MCHC: 28.9 g/dL — ABNORMAL LOW (ref 30.0–36.0)
MCHC: 32.7 g/dL (ref 30.0–36.0)
MCV: 96.6 fL (ref 78.0–100.0)
MCV: 85.5 fL (ref 78.0–100.0)
PLATELETS: 437 10*3/uL — AB (ref 150–400)
Platelets: 351 K/uL (ref 150–400)
RBC: 4.16 MIL/uL — AB (ref 4.22–5.81)
RBC: 4.08 MIL/uL — ABNORMAL LOW (ref 4.22–5.81)
RDW: 15.2 % (ref 11.5–15.5)
RDW: 14.6 % (ref 11.5–15.5)
WBC: 27.4 10*3/uL — AB (ref 4.0–10.5)
WBC: 21.3 K/uL — ABNORMAL HIGH (ref 4.0–10.5)

## 2013-11-23 LAB — POCT I-STAT 3, ART BLOOD GAS (G3+)
Acid-base deficit: 8 mmol/L — ABNORMAL HIGH (ref 0.0–2.0)
Bicarbonate: 16.9 meq/L — ABNORMAL LOW (ref 20.0–24.0)
O2 Saturation: 94 %
TCO2: 18 mmol/L (ref 0–100)
pCO2 arterial: 33.9 mmHg — ABNORMAL LOW (ref 35.0–45.0)
pH, Arterial: 7.306 — ABNORMAL LOW (ref 7.350–7.450)
pO2, Arterial: 76 mmHg — ABNORMAL LOW (ref 80.0–100.0)

## 2013-11-23 LAB — COMPREHENSIVE METABOLIC PANEL
ALT: 17 U/L (ref 0–53)
AST: 16 U/L (ref 0–37)
Albumin: 4.2 g/dL (ref 3.5–5.2)
Alkaline Phosphatase: 190 U/L — ABNORMAL HIGH (ref 39–117)
Anion gap: 45 — ABNORMAL HIGH (ref 5–15)
BILIRUBIN TOTAL: 0.3 mg/dL (ref 0.3–1.2)
BUN: 51 mg/dL — ABNORMAL HIGH (ref 6–23)
CHLORIDE: 70 meq/L — AB (ref 96–112)
CO2: 7 meq/L — AB (ref 19–32)
Calcium: 9.2 mg/dL (ref 8.4–10.5)
Creatinine, Ser: 3.15 mg/dL — ABNORMAL HIGH (ref 0.50–1.35)
GFR, EST AFRICAN AMERICAN: 28 mL/min — AB (ref >90)
GFR, EST NON AFRICAN AMERICAN: 24 mL/min — AB (ref >90)
GLUCOSE: 1279 mg/dL — AB (ref 70–99)
Potassium: >7.7 mEq/L (ref 3.7–5.3)
SODIUM: 122 meq/L — AB (ref 137–147)
Total Protein: 7.6 g/dL (ref 6.0–8.3)

## 2013-11-23 LAB — URINE MICROSCOPIC-ADD ON: RBC / HPF: NONE SEEN RBC/hpf (ref <3)

## 2013-11-23 LAB — BASIC METABOLIC PANEL
ANION GAP: 29 — AB (ref 5–15)
Anion gap: 21 — ABNORMAL HIGH (ref 5–15)
BUN: 45 mg/dL — ABNORMAL HIGH (ref 6–23)
BUN: 40 mg/dL — ABNORMAL HIGH (ref 6–23)
CALCIUM: 8.6 mg/dL (ref 8.4–10.5)
CO2: 14 mEq/L — ABNORMAL LOW (ref 19–32)
CO2: 16 meq/L — AB (ref 19–32)
CREATININE: 2.73 mg/dL — AB (ref 0.50–1.35)
Calcium: 9.2 mg/dL (ref 8.4–10.5)
Chloride: 91 mEq/L — ABNORMAL LOW (ref 96–112)
Chloride: 101 mEq/L (ref 96–112)
Creatinine, Ser: 2.1 mg/dL — ABNORMAL HIGH (ref 0.50–1.35)
GFR calc Af Amer: 46 mL/min — ABNORMAL LOW (ref >90)
GFR calc non Af Amer: 40 mL/min — ABNORMAL LOW (ref >90)
GFR, EST AFRICAN AMERICAN: 34 mL/min — AB (ref >90)
GFR, EST NON AFRICAN AMERICAN: 29 mL/min — AB (ref >90)
Glucose, Bld: 792 mg/dL (ref 70–99)
Glucose, Bld: 177 mg/dL — ABNORMAL HIGH (ref 70–99)
Potassium: 5.6 mEq/L — ABNORMAL HIGH (ref 3.7–5.3)
Potassium: UNDETERMINED mEq/L (ref 3.7–5.3)
Sodium: 134 mEq/L — ABNORMAL LOW (ref 137–147)
Sodium: 138 mEq/L (ref 137–147)

## 2013-11-23 LAB — COMPREHENSIVE METABOLIC PANEL WITH GFR
ALT: 17 U/L (ref 0–53)
AST: 18 U/L (ref 0–37)
Albumin: 3.7 g/dL (ref 3.5–5.2)
Alkaline Phosphatase: 182 U/L — ABNORMAL HIGH (ref 39–117)
Anion gap: 26 — ABNORMAL HIGH (ref 5–15)
BUN: 44 mg/dL — ABNORMAL HIGH (ref 6–23)
CO2: 14 meq/L — ABNORMAL LOW (ref 19–32)
Calcium: 9 mg/dL (ref 8.4–10.5)
Chloride: 94 meq/L — ABNORMAL LOW (ref 96–112)
Creatinine, Ser: 2.63 mg/dL — ABNORMAL HIGH (ref 0.50–1.35)
GFR calc Af Amer: 35 mL/min — ABNORMAL LOW
GFR calc non Af Amer: 30 mL/min — ABNORMAL LOW
Glucose, Bld: 669 mg/dL (ref 70–99)
Potassium: 5.2 meq/L (ref 3.7–5.3)
Sodium: 134 meq/L — ABNORMAL LOW (ref 137–147)
Total Bilirubin: 0.3 mg/dL (ref 0.3–1.2)
Total Protein: 7.3 g/dL (ref 6.0–8.3)

## 2013-11-23 LAB — I-STAT VENOUS BLOOD GAS, ED
Acid-base deficit: 19 mmol/L — ABNORMAL HIGH (ref 0.0–2.0)
BICARBONATE: 8.9 meq/L — AB (ref 20.0–24.0)
O2 Saturation: 85 %
PH VEN: 7.101 — AB (ref 7.250–7.300)
PO2 VEN: 66 mmHg — AB (ref 30.0–45.0)
TCO2: 10 mmol/L (ref 0–100)
pCO2, Ven: 28.5 mmHg — ABNORMAL LOW (ref 45.0–50.0)

## 2013-11-23 LAB — RAPID URINE DRUG SCREEN, HOSP PERFORMED
Amphetamines: NOT DETECTED
Barbiturates: NOT DETECTED
Benzodiazepines: NOT DETECTED
COCAINE: POSITIVE — AB
OPIATES: POSITIVE — AB
Tetrahydrocannabinol: NOT DETECTED

## 2013-11-23 LAB — CBG MONITORING, ED
Glucose-Capillary: >600 mg/dL (ref 70–99)
Glucose-Capillary: >600 mg/dL (ref 70–99)

## 2013-11-23 LAB — TROPONIN I: Troponin I: <0.3 ng/mL

## 2013-11-23 LAB — GLUCOSE, CAPILLARY
Glucose-Capillary: >600 mg/dL (ref 70–99)
Glucose-Capillary: >600 mg/dL (ref 70–99)

## 2013-11-23 LAB — MRSA PCR SCREENING: MRSA by PCR: NEGATIVE

## 2013-11-23 LAB — LACTIC ACID, PLASMA: Lactic Acid, Venous: 2 mmol/L (ref 0.5–2.2)

## 2013-11-23 LAB — I-STAT CG4 LACTIC ACID, ED: Lactic Acid, Venous: 9.47 mmol/L — ABNORMAL HIGH (ref 0.5–2.2)

## 2013-11-23 MED ORDER — HEPARIN SODIUM (PORCINE) 5000 UNIT/ML IJ SOLN
5000.0000 [IU] | Freq: Three times a day (TID) | INTRAMUSCULAR | Status: DC
  Administered 2013-11-23 – 2013-11-24 (×3): 5000 [IU] via SUBCUTANEOUS
  Filled 2013-11-23 (×4): qty 1

## 2013-11-23 MED ORDER — SODIUM CHLORIDE 0.9 % IV SOLN: 250.0000 mL | INTRAVENOUS | Status: DC | PRN

## 2013-11-23 MED ORDER — SODIUM CHLORIDE 0.9 % IV SOLN
INTRAVENOUS | Status: DC
  Administered 2013-11-23: 10.8 [IU]/h via INTRAVENOUS
  Administered 2013-11-23: 5.4 [IU]/h via INTRAVENOUS
  Filled 2013-11-23: qty 2.5

## 2013-11-23 MED ORDER — INSULIN ASPART 100 UNIT/ML IV SOLN
10.0000 [IU] | Freq: Once | INTRAVENOUS | Status: AC
  Administered 2013-11-23: 10 [IU] via INTRAVENOUS
  Filled 2013-11-23: qty 0.1

## 2013-11-23 MED ORDER — SODIUM CHLORIDE 0.9 % IV BOLUS (SEPSIS)
1000.0000 mL | Freq: Once | INTRAVENOUS | Status: AC
  Administered 2013-11-23: 1000 mL via INTRAVENOUS

## 2013-11-23 MED ORDER — SODIUM CHLORIDE 0.9 % IV SOLN
INTRAVENOUS | Status: DC
  Administered 2013-11-23: 16:00:00 via INTRAVENOUS

## 2013-11-23 MED ORDER — SODIUM CHLORIDE 0.9 % IV SOLN
INTRAVENOUS | Status: DC
  Filled 2013-11-23: qty 2.5

## 2013-11-23 MED ORDER — DEXTROSE-NACL 5-0.45 % IV SOLN
INTRAVENOUS | Status: DC
  Administered 2013-11-23: 100 mL via INTRAVENOUS

## 2013-11-23 MED ORDER — DEXTROSE 50 % IV SOLN: 25.0000 mL | INTRAVENOUS | Status: DC | PRN

## 2013-11-23 MED ORDER — CALCIUM GLUCONATE 10 % IV SOLN
1.0000 g | Freq: Once | INTRAVENOUS | Status: AC
  Administered 2013-11-23: 1 g via INTRAVENOUS

## 2013-11-23 MED ORDER — ASPIRIN 300 MG RE SUPP: 300.0000 mg | RECTAL | Status: AC

## 2013-11-23 MED ORDER — ASPIRIN 81 MG PO CHEW
324.0000 mg | CHEWABLE_TABLET | ORAL | Status: AC
  Administered 2013-11-23: 324 mg via ORAL
  Filled 2013-11-23: qty 4

## 2013-11-23 MED ORDER — ONDANSETRON HCL 4 MG/2ML IJ SOLN: 4.0000 mg | Freq: Four times a day (QID) | INTRAMUSCULAR | Status: DC | PRN

## 2013-11-23 NOTE — H&P (Signed)
PULMONARY / CRITICAL CARE MEDICINE   Name: Luke [name] MRN: [id number] DOB: [birth year]    ADMISSION DATE:  11/23/2013  REFERRING MD :  EDP   CHIEF COMPLAINT:  DKA  INITIAL PRESENTATION: 33 yo polysubstance abuse (IV drug abuse), Type I DM  presented to ER w/ altered mental status, n/v on 9/7 . Found to have DKA , Acute Renal failure with severe Hyperkalemia .    STUDIES:    SIGNIFICANT EVENTS:    HISTORY OF PRESENT ILLNESS:  33 yo male with known hx of DM -Type 1 with chronic renal disease and IV drug abuse/polysubstance abuse along with recurrent arm abscess requiring debridement in past presented to ER on 9/7 w n/vd and altered mental status found to be in DKA . BS on arrival >1200, very acidotic w/ pH 7.1 and severe hyperkalemia with K+ 7.8 and renal failure w/ scr up to 3.5 (baseline 1.3)  Pt wife is present says he buys over the counter Insulin and does not have a local PCP or endocrinologist. Continues to use drugs. Tox screen pending . Pt started on IVF resusciatation , 2 L of NS given in ER .  BP very elevated on presentation w/ ST /ectopy.  Episode of wide complex QRS /tachycardia brief episode .   PAST MEDICAL HISTORY :  Past Medical History  Diagnosis Date  . Diabetes mellitus without complication   . Anxiety   . Pneumonia   . GERD (gastroesophageal reflux disease)   . Seizures   . [id number])    Past Surgical History  Procedure Laterality Date  . Irrigation and debridement abscess      Hx: of left arm  . I&d extremity Left 10/11/2012    Procedure: IRRIGATION AND DEBRIDEMENT ABSCESS FOREARM;  Surgeon: Linna Hoff, MD;  Location: [place];  Service: Orthopedics;  Laterality: Left;  . I&d extremity Left 10/12/2012    Procedure: IRRIGATION AND DEBRIDEMENT FOREARM;  Surgeon: Linna Hoff, MD;  Location: [place];  Service: Orthopedics;  Laterality: Left;  . I&d extremity Left 10/14/2012    Procedure: incision and drainage left forearm;  Surgeon: Linna Hoff, MD;   Location: [place];  Service: Orthopedics;  Laterality: Left;  . I&d extremity Left 10/16/2012    Procedure: IRRIGATION AND DEBRIDEMENT LEFT FOREARM;  Surgeon: Linna Hoff, MD;  Location: [place];  Service: Orthopedics;  Laterality: Left;  . I&d extremity Left 10/20/2012    Procedure: INCISION AND DRAINAGE AND DEBRIDEMENT LEFT  FOREARM;  Surgeon: Linna Hoff, MD;  Location: [place];  Service: Orthopedics;  Laterality: Left;   Prior to Admission medications   Medication Sig Start Date End Date Taking? Authorizing Provider  esomeprazole (NEXIUM) 40 MG capsule Take 40 mg by mouth daily before breakfast.   Yes Historical Provider, MD  insulin aspart (NOVOLOG) 100 UNIT/ML injection Inject 4-16 Units into the skin 3 (three) times daily with meals. *per sliding scale   Yes Historical Provider, MD  insulin aspart protamine- aspart (NOVOLOG MIX 70/30) (70-30) 100 UNIT/ML injection Inject 45-55 Units into the skin 2 (two) times daily with a meal. Uses 55 units in the morning and 45 units at night   Yes Historical Provider, MD   No Known Allergies  FAMILY HISTORY:  Family History  Problem Relation Age of Onset  . Diabetes Father   . Heart disease Father   . Mental illness Sister    SOCIAL HISTORY:  reports that he has been smoking Cigarettes.  He  has been smoking about 1.00 pack per day. He has never used smokeless tobacco. He reports that he drinks alcohol. He reports that he uses illicit drugs (Cocaine and IV).  REVIEW OF SYSTEMS:  Unable to obtain due to altered mental status  Wife reports very high sugars and n/v  SUBJECTIVE:  DKA admission   VITAL SIGNS: Temp:  [98.8 F (37.1 C)] 98.8 F (37.1 C) (09/07 1131) Pulse Rate:  [119-250] 128 (09/07 1315) Resp:  [14-20] 15 (09/07 1315) BP: (114-137)/(39-59) 130/52 mmHg (09/07 1315) SpO2:  [99 %-100 %] 99 % (09/07 1315) HEMODYNAMICS:    INTAKE / OUTPUT: No intake or output data in the 24 hours ending 11/23/13 1356  PHYSICAL  EXAMINATION: General: mild distress appears uncomfortable  Neuro:  Agitated, follows simple commands.  HEENT:  Dry mucosa  Cardiovascular:  ST  Lungs: Diminshed BS in bases, no wheezing  Abdomen:  Soft, NT , BS +  Musculoskeletal:  Intact  Skin:  Multiple skin excoriations along arms and hands ? IV drug use sites   LABS:  CBC  Recent Labs Lab 11/23/13 1137 11/23/13 1215  WBC 27.4*  --   HGB 11.6* 14.3  HCT 40.2 42.0  PLT 437*  --    Coag's No results found for this basename: APTT, INR,  in the last 168 hours BMET  Recent Labs Lab 11/23/13 1137 11/23/13 1215  NA 122* 119*  K >7.7* 8.2*  CL 70* 90*  CO2 7*  --   BUN 51* 47*  CREATININE 3.15* 3.50*  GLUCOSE 1279* >700*   Electrolytes  Recent Labs Lab 11/23/13 1137  CALCIUM 9.2   Sepsis Markers  Recent Labs Lab 11/23/13 1215  LATICACIDVEN 9.47*   ABG No results found for this basename: PHART, PCO2ART, PO2ART,  in the last 168 hours Liver Enzymes  Recent Labs Lab 11/23/13 1137  AST 16  ALT 17  ALKPHOS 190*  BILITOT 0.3  ALBUMIN 4.2   Cardiac Enzymes No results found for this basename: TROPONINI, PROBNP,  in the last 168 hours Glucose  Recent Labs Lab 11/23/13 1134 11/23/13 1305  GLUCAP >600* >600*    Imaging No results found.   ASSESSMENT / PLAN:  PULMONARY  A:  Concern for airway protection w/ altered mental status  P:   O2 to keep sats >90%   CARDIOVASCULAR  A: Sinus Tachycardia  P:  Trend enzymes  Correct electrolytes imbalances  Repeat EKG in am   RENAL A:  Acute on chronic Renal Failure (baseline 1.3)  Severe Hyperkalemia  Metabolic Acidosis   P:   IVF resusciatation  Tr bmp per protocol , check bmet now DKA protocol   GASTROINTESTINAL A:  N/V  P:   zofran As needed    HEMATOLOGIC A:  Anemia  P:  Monitor   INFECTIOUS A:  Leukocytosis ? DKA related  No apparent infectious source -check UCx  P:    9/7 UC   Abx: , start date, day   ENDOCRINE A:   DKA  P:   DKA protocol    NEUROLOGIC A:  Altered mental status  IV Drug abuse   P:   Monitor for w/d  Avoid oversedation  Drug screen pending   TODAY'S SUMMARY: 33 yo male IV drug abuse /polysubstance use , type 1 DM presented to ER with DKA , Hyperkalemia and acute renal failure .    I have personally obtained a history, examined the patient, evaluated laboratory and imaging results, formulated the assessment and  plan and placed orders. CRITICAL CARE: The patient is critically ill with multiple organ systems failure and requires high complexity decision making for assessment and support, frequent evaluation and titration of therapies, application of advanced monitoring technologies and extensive interpretation of multiple databases. Critical Care Time devoted to patient care services described in this note is  minutes.   PARRETT,TAMMY NP-C  Pulmonary and [place] Pager: [phone]   PCCM ATTENDING: I have interviewed and examined the patient and reviewed the database. I have formulated the assessment and plan as reflected in the note above with amendments made by me.   Pt and wife updated at bedside  Merton Border, MD;  PCCM service; [place] [phone]  11/23/2013, 1:56 PM

## 2013-11-23 NOTE — ED Notes (Signed)
Results of chem 8 and lactic acid given to Dr. Christy Gentles

## 2013-11-23 NOTE — ED Notes (Signed)
Pt reports that he has been vomiting since Saturday and also having chest pain. States that he has been unable to keep anything down. Reports that he took 14 units of regular insulin this morning but his meter read high.

## 2013-11-23 NOTE — ED Notes (Signed)
Informed Dr Christy Gentles of critical labs

## 2013-11-23 NOTE — ED Provider Notes (Signed)
CSN: [id number]     Arrival date & time 11/23/13  1123 History   First MD Initiated Contact with Patient 11/23/13 1143     Chief Complaint  Patient presents with  . Emesis      Patient is a 33 y.o. male presenting with vomiting. The history is provided by the patient. The history is limited by the condition of the patient.  Emesis Severity:  Severe Duration:  2 days Timing:  Intermittent Progression:  Worsening Chronicity:  New Relieved by:  Nothing Worsened by:  Nothing tried Pt has had increasing vomiting for past 2 days He is a diabetic and has been unable to control his glucose He also reports CP with vomiting Pt does not provide any other details at this time  Past Medical History  Diagnosis Date  . Diabetes mellitus without complication   . Anxiety   . Pneumonia   . GERD (gastroesophageal reflux disease)   . Seizures   . [id number])    Past Surgical History  Procedure Laterality Date  . Irrigation and debridement abscess      Hx: of left arm  . I&d extremity Left 10/11/2012    Procedure: IRRIGATION AND DEBRIDEMENT ABSCESS FOREARM;  Surgeon: Luke Hoff, MD;  Location: [facility];  Service: Orthopedics;  Laterality: Left;  . I&d extremity Left 10/12/2012    Procedure: IRRIGATION AND DEBRIDEMENT FOREARM;  Surgeon: Luke Hoff, MD;  Location: [facility];  Service: Orthopedics;  Laterality: Left;  . I&d extremity Left 10/14/2012    Procedure: incision and drainage left forearm;  Surgeon: Luke Hoff, MD;  Location: [facility];  Service: Orthopedics;  Laterality: Left;  . I&d extremity Left 10/16/2012    Procedure: IRRIGATION AND DEBRIDEMENT LEFT FOREARM;  Surgeon: Luke Hoff, MD;  Location: [facility];  Service: Orthopedics;  Laterality: Left;  . I&d extremity Left 10/20/2012    Procedure: INCISION AND DRAINAGE AND DEBRIDEMENT LEFT  FOREARM;  Surgeon: Luke Hoff, MD;  Location: [facility];  Service: Orthopedics;  Laterality: Left;   Family History  Problem Relation Age of Onset   . Diabetes Father   . Heart disease Father   . Mental illness Sister    History  Substance Use Topics  . Smoking status: Current Every Day Smoker -- 1.00 packs/day    Types: Cigarettes  . Smokeless tobacco: Never Used  . Alcohol Use: Yes     Comment: occasional    Review of Systems  Unable to perform ROS: Acuity of condition  Gastrointestinal: Positive for vomiting.      Allergies  Review of patient's allergies indicates no known allergies.  Home Medications   Prior to Admission medications   Medication Sig Start Date End Date Taking? Authorizing Provider  alprazolam Luke [name]) 2 MG tablet Take 1-2 mg by mouth 2 (two) times daily as needed for sleep or anxiety.    Historical Provider, MD  esomeprazole (NEXIUM) 40 MG capsule Take 40 mg by mouth daily before breakfast.    Historical Provider, MD  insulin aspart (NOVOLOG) 100 UNIT/ML injection Inject 4-16 Units into the skin 3 (three) times daily with meals. *per sliding scale    Historical Provider, MD  insulin aspart protamine- aspart (NOVOLOG MIX 70/30) (70-30) 100 UNIT/ML injection Inject 45-55 Units into the skin 2 (two) times daily with a meal. Uses 55 units in the morning and 45 units at night    Historical Provider, MD  lisinopril (PRINIVIL,ZESTRIL) 10 MG tablet Take 10 mg by mouth  daily.    Historical Provider, MD  oxymorphone (OPANA ER) 20 MG 12 hr tablet Take 1 tablet (20 mg total) by mouth every 12 (twelve) hours. 10/23/12   Luke Dumas, MD   BP 114/46  Pulse 250  Temp(Src) 98.8 F (37.1 C) (Oral)  Resp 20  SpO2 99% Physical Exam CONSTITUTIONAL: ill appearing HEAD: Normocephalic/atraumatic EYES: EOMI/PERRL ENMT: Mucous membranes dry NECK: supple no meningeal signs SPINE:entire spine nontender CV: tachycardic LUNGS: Lungs are clear to auscultation bilaterally, no apparent distress ABDOMEN: soft, nontender, no rebound or guarding GU:no cva tenderness NEURO: Pt is somnolent but easily arousable, maex4, answers  questions appropriately EXTREMITIES: pulses normal, full ROM Multiple healed track marks to both arms.  Mild erythema to left forearm but denies tenderness SKIN: warm, color normal PSYCH: no abnormalities of mood noted  ED Course  Procedures  CRITICAL CARE Performed by: Luke [name] Total critical care time: 45 Critical care time was exclusive of separately billable procedures and treating other patients. Critical care was necessary to treat or prevent imminent or life-threatening deterioration. Critical care was time spent personally by me on the following activities: development of treatment plan with patient and/or surrogate as well as nursing, discussions with consultants, evaluation of patient's response to treatment, examination of patient, obtaining history from patient or surrogate, ordering and performing treatments and interventions, ordering and review of laboratory studies, ordering and review of radiographic studies, pulse oximetry and re-evaluation of patient's condition.   Pt seen on arrival to room for hyperglycemia and tachycardia Suspicion that EKG changes due to hyperkalemia due to underlying hyperglycemia IV insulin and calcium have been ordered IV fluids have been ordered  1:26 PM Heart rate has improved  EKG Interpretation  Date/Time:  Monday November 23 2013 12:50:02 EDT Ventricular Rate:  134 PR Interval:  137 QRS Duration: 87 QT Interval:  313 QTC Calculation: 467 R Axis:   57 Text Interpretation:  Sinus tachycardia Probable left atrial enlargement rate improved when compared to prior Confirmed by Luke Gentles  MD, Luke [name] ([id number]) on 11/23/2013 1:25:45 PM      Pt with significant abnormalities for DKA and acute renal failure IV insulin drip has been ordered D/w dr Luke [name] will admit to ICU BP 130/52  Pulse 128  Temp(Src) 98.8 F (37.1 C) (Oral)  Resp 15  SpO2 99%  Labs Review Labs Reviewed  CBC - Abnormal; Notable for the following:    WBC 27.4  (*)    RBC 4.16 (*)    Hemoglobin 11.6 (*)    MCHC 28.9 (*)    Platelets 437 (*)    All other components within normal limits  I-STAT CG4 LACTIC ACID, ED - Abnormal; Notable for the following:    Lactic Acid, Venous 9.47 (*)    All other components within normal limits  CBG MONITORING, ED - Abnormal; Notable for the following:    Glucose-Capillary >600 (*)    All other components within normal limits  I-STAT CHEM 8, ED - Abnormal; Notable for the following:    Sodium 119 (*)    Potassium 8.2 (*)    Chloride 90 (*)    BUN 47 (*)    Creatinine, Ser 3.50 (*)    Glucose, Bld >700 (*)    Calcium, Ion 1.03 (*)    All other components within normal limits  I-STAT VENOUS BLOOD GAS, ED - Abnormal; Notable for the following:    pH, Ven 7.101 (*)    pCO2, Ven 28.5 (*)    pO2,  Ven 66.0 (*)    Bicarbonate 8.9 (*)    Acid-base deficit 19.0 (*)    All other components within normal limits  COMPREHENSIVE METABOLIC PANEL  URINALYSIS, ROUTINE W REFLEX MICROSCOPIC      MDM   Final diagnoses:  Diabetic ketoacidosis without coma associated with other specified diabetes mellitus  AKI (acute kidney injury)  Hyperkalemia  Dehydration    Nursing notes including past medical history and social history reviewed and considered in documentation Labs/vital reviewed and considered     Luke Cable, MD 11/23/13 1328

## 2013-11-23 NOTE — Progress Notes (Signed)
Patient refusing blood draws until 0500 on 11/24/2013. Last BMET collected at 2100 reports anion gap at 21, but sample not sufficient enough for a K reading. Last K reading 4.4, down from initial reading of 8 on arrival to emergency department. All this information reported to [employer] with [facility]. Will draw next BMET and Troponin at 5am. Patient educated as to why we need these blood samples and continues to refuse. Will continue to monitor and assess patient.

## 2013-11-23 NOTE — ED Notes (Signed)
Notified RN of CBG HI

## 2013-11-23 NOTE — ED Notes (Signed)
Upon entering room pt noted to have wide complex tachycardia over 200bpm; Dr Sabra Heck notified until Dr Christy Gentles could enter room

## 2013-11-24 DIAGNOSIS — E875 Hyperkalemia: Secondary | ICD-10-CM

## 2013-11-24 DIAGNOSIS — N179 Acute kidney failure, unspecified: Secondary | ICD-10-CM

## 2013-11-24 LAB — MAGNESIUM: Magnesium: 1.7 mg/dL (ref 1.5–2.5)

## 2013-11-24 LAB — GLUCOSE, CAPILLARY
GLUCOSE-CAPILLARY: 108 mg/dL — AB (ref 70–99)
GLUCOSE-CAPILLARY: 123 mg/dL — AB (ref 70–99)
GLUCOSE-CAPILLARY: 134 mg/dL — AB (ref 70–99)
GLUCOSE-CAPILLARY: 140 mg/dL — AB (ref 70–99)
GLUCOSE-CAPILLARY: 142 mg/dL — AB (ref 70–99)
GLUCOSE-CAPILLARY: 184 mg/dL — AB (ref 70–99)
GLUCOSE-CAPILLARY: 192 mg/dL — AB (ref 70–99)
Glucose-Capillary: 106 mg/dL — ABNORMAL HIGH (ref 70–99)
Glucose-Capillary: 107 mg/dL — ABNORMAL HIGH (ref 70–99)
Glucose-Capillary: 118 mg/dL — ABNORMAL HIGH (ref 70–99)
Glucose-Capillary: 139 mg/dL — ABNORMAL HIGH (ref 70–99)
Glucose-Capillary: 157 mg/dL — ABNORMAL HIGH (ref 70–99)
Glucose-Capillary: 172 mg/dL — ABNORMAL HIGH (ref 70–99)
Glucose-Capillary: 204 mg/dL — ABNORMAL HIGH (ref 70–99)
Glucose-Capillary: 227 mg/dL — ABNORMAL HIGH (ref 70–99)
Glucose-Capillary: 228 mg/dL — ABNORMAL HIGH (ref 70–99)
Glucose-Capillary: 233 mg/dL — ABNORMAL HIGH (ref 70–99)
Glucose-Capillary: 249 mg/dL — ABNORMAL HIGH (ref 70–99)
Glucose-Capillary: 254 mg/dL — ABNORMAL HIGH (ref 70–99)
Glucose-Capillary: 271 mg/dL — ABNORMAL HIGH (ref 70–99)
Glucose-Capillary: 302 mg/dL — ABNORMAL HIGH (ref 70–99)
Glucose-Capillary: 435 mg/dL — ABNORMAL HIGH (ref 70–99)
Glucose-Capillary: 51 mg/dL — ABNORMAL LOW (ref 70–99)
Glucose-Capillary: 73 mg/dL (ref 70–99)
Glucose-Capillary: 78 mg/dL (ref 70–99)
Glucose-Capillary: 97 mg/dL (ref 70–99)

## 2013-11-24 LAB — URINE CULTURE
CULTURE: NO GROWTH
Colony Count: NO GROWTH

## 2013-11-24 LAB — BLOOD GAS, ARTERIAL
ACID-BASE DEFICIT: 1.7 mmol/L (ref 0.0–2.0)
BICARBONATE: 22.3 meq/L (ref 20.0–24.0)
Drawn by: 31276
FIO2: 0.21 %
O2 Saturation: 95.9 %
PATIENT TEMPERATURE: 98.6
PO2 ART: 76.5 mmHg — AB (ref 80.0–100.0)
TCO2: 23.4 mmol/L (ref 0–100)
pCO2 arterial: 36.2 mmHg (ref 35.0–45.0)
pH, Arterial: 7.406 (ref 7.350–7.450)

## 2013-11-24 LAB — BASIC METABOLIC PANEL
Anion gap: 20 — ABNORMAL HIGH (ref 5–15)
Anion gap: 16 — ABNORMAL HIGH (ref 5–15)
BUN: 32 mg/dL — ABNORMAL HIGH (ref 6–23)
BUN: 24 mg/dL — ABNORMAL HIGH (ref 6–23)
CO2: 18 mEq/L — ABNORMAL LOW (ref 19–32)
CO2: 22 meq/L (ref 19–32)
CREATININE: 1.6 mg/dL — AB (ref 0.50–1.35)
Calcium: 8.4 mg/dL (ref 8.4–10.5)
Calcium: 8.9 mg/dL (ref 8.4–10.5)
Chloride: 95 mEq/L — ABNORMAL LOW (ref 96–112)
Chloride: 98 mEq/L (ref 96–112)
Creatinine, Ser: 1.29 mg/dL (ref 0.50–1.35)
GFR calc Af Amer: 64 mL/min — ABNORMAL LOW (ref >90)
GFR calc Af Amer: 83 mL/min — ABNORMAL LOW (ref >90)
GFR calc non Af Amer: 55 mL/min — ABNORMAL LOW (ref >90)
GFR calc non Af Amer: 72 mL/min — ABNORMAL LOW (ref >90)
Glucose, Bld: 259 mg/dL — ABNORMAL HIGH (ref 70–99)
Glucose, Bld: 107 mg/dL — ABNORMAL HIGH (ref 70–99)
Potassium: 4.4 mEq/L (ref 3.7–5.3)
Potassium: 3.9 mEq/L (ref 3.7–5.3)
SODIUM: 136 meq/L — AB (ref 137–147)
Sodium: 133 mEq/L — ABNORMAL LOW (ref 137–147)

## 2013-11-24 LAB — CBC
HCT: 30.1 % — ABNORMAL LOW (ref 39.0–52.0)
Hemoglobin: 10.2 g/dL — ABNORMAL LOW (ref 13.0–17.0)
MCH: 27.7 pg (ref 26.0–34.0)
MCHC: 33.9 g/dL (ref 30.0–36.0)
MCV: 81.8 fL (ref 78.0–100.0)
Platelets: 307 10*3/uL (ref 150–400)
RBC: 3.68 MIL/uL — ABNORMAL LOW (ref 4.22–5.81)
RDW: 14.8 % (ref 11.5–15.5)
WBC: 17.4 10*3/uL — ABNORMAL HIGH (ref 4.0–10.5)

## 2013-11-24 LAB — LACTIC ACID, PLASMA: LACTIC ACID, VENOUS: 0.8 mmol/L (ref 0.5–2.2)

## 2013-11-24 LAB — PHOSPHORUS: PHOSPHORUS: 3 mg/dL (ref 2.3–4.6)

## 2013-11-24 LAB — TROPONIN I

## 2013-11-24 MED ORDER — INSULIN GLARGINE 100 UNIT/ML ~~LOC~~ SOLN
10.0000 [IU] | Freq: Every day | SUBCUTANEOUS | Status: DC
  Administered 2013-11-24: 10 [IU] via SUBCUTANEOUS
  Filled 2013-11-24 (×2): qty 0.1

## 2013-11-24 MED ORDER — INSULIN GLARGINE 100 UNIT/ML ~~LOC~~ SOLN
30.0000 [IU] | Freq: Once | SUBCUTANEOUS | Status: AC
  Administered 2013-11-24: 30 [IU] via SUBCUTANEOUS
  Filled 2013-11-24: qty 0.3

## 2013-11-24 MED ORDER — INSULIN ASPART 100 UNIT/ML ~~LOC~~ SOLN: 0.0000 [IU] | SUBCUTANEOUS | Status: DC

## 2013-11-24 NOTE — Progress Notes (Signed)
eLink Physician-Brief Progress Note Patient Name: ASHDYN HALLUM DOB: July 04, 1980 MRN: [id number]   Date of Service  11/24/2013  HPI/Events of Note   Hypoglycemia while in Insulin gtt Lost IV access   eICU Interventions   D/c Insulin D/c D5 Start carb modified diet Start SSI Give Lantus 10 Will assess for CVL placement       Intervention Category Major Interventions: Hyperglycemia - active titration of insulin therapy  Dyan Labarbera 11/24/2013, 6:40 PM

## 2013-11-24 NOTE — Progress Notes (Addendum)
No new complaints. Lethargic but cognition intact  Filed Vitals:   11/24/13 0812 11/24/13 0900 11/24/13 1000 11/24/13 1233  BP:  132/63 130/69   Pulse:  96 98   Temp: 98.6 F (37 C)   99 F (37.2 C)  TempSrc: Oral   Oral  Resp:  9 20   Height:      Weight:      SpO2:  98% 98%    NAD No JVD Chest clear RRR s M NABS, soft Ext warm without edema  I have reviewed all of today's lab results. Relevant abnormalities are discussed in the A/P section AG remains elevated @ 20  IMPRESSION: DKA (recurrent) AKI, resolving Hyperkalemia, resolved Medical noncompliance (per wife) Documented polysubstance abuse   PLAN: Cont DKA protocol Transfer to SDU until off insulin gtt Lantus X 1 dose given AM 9/08 Possibly ready for discharge home 9/09 so leave on PCCM service   Merton Border, MD ; [facility] service Mobile [phone].  After 5:30 PM or weekends, call [phone]

## 2013-11-24 NOTE — Progress Notes (Signed)
Paged Dr [name] Pila about loss of IV access. Unsuccessful attempts by 2 staff nurses and IV team with use of ultrasound. IV team recommends central line. MD says it is okay for pt to remain off of insulin gtt and IV fluids. Will change pt to q4h cbg and add sliding scale.

## 2013-11-24 NOTE — Progress Notes (Signed)
UR Completed.  Ta Fair Jane [phone]  

## 2013-11-25 LAB — BASIC METABOLIC PANEL
Anion gap: 12 (ref 5–15)
BUN: 16 mg/dL (ref 6–23)
CHLORIDE: 101 meq/L (ref 96–112)
CO2: 23 mEq/L (ref 19–32)
CREATININE: 1.09 mg/dL (ref 0.50–1.35)
Calcium: 8.8 mg/dL (ref 8.4–10.5)
GFR calc non Af Amer: 88 mL/min — ABNORMAL LOW (ref >90)
Glucose, Bld: 154 mg/dL — ABNORMAL HIGH (ref 70–99)
POTASSIUM: 3.7 meq/L (ref 3.7–5.3)
Sodium: 136 mEq/L — ABNORMAL LOW (ref 137–147)

## 2013-11-25 LAB — GLUCOSE, CAPILLARY
GLUCOSE-CAPILLARY: 65 mg/dL — AB (ref 70–99)
Glucose-Capillary: 52 mg/dL — ABNORMAL LOW (ref 70–99)
Glucose-Capillary: 101 mg/dL — ABNORMAL HIGH (ref 70–99)
Glucose-Capillary: 148 mg/dL — ABNORMAL HIGH (ref 70–99)
Glucose-Capillary: 317 mg/dL — ABNORMAL HIGH (ref 70–99)

## 2013-11-25 MED ORDER — INSULIN SYRINGES (DISPOSABLE) U-100 0.5 ML MISC: 1.0000 | Freq: Three times a day (TID) | Status: DC | PRN

## 2013-11-25 MED ORDER — INSULIN ASPART PROT & ASPART (70-30 MIX) 100 UNIT/ML ~~LOC~~ SUSP: 45.0000 [IU] | Freq: Two times a day (BID) | SUBCUTANEOUS | Status: DC

## 2013-11-25 MED ORDER — INSULIN ASPART 100 UNIT/ML ~~LOC~~ SOLN: 4.0000 [IU] | Freq: Three times a day (TID) | SUBCUTANEOUS | Status: DC | PRN

## 2013-11-25 MED ORDER — "PEN NEEDLES 3/16"" 31G X 5 MM MISC": 1.0000 | Freq: Three times a day (TID) | Status: DC | PRN

## 2013-11-25 MED ORDER — BLOOD GLUCOSE METER KIT: PACK | Status: DC

## 2013-11-25 NOTE — Discharge Summary (Signed)
Physician Discharge Summary  Patient ID: Luke [name] MRN: [id number] DOB/AGE: November 19, 1980 33 y.o.  Admit date: 11/23/2013 Discharge date: 11/25/2013     Discharge Diagnoses:  DKA (recurrent)  AKI, resolving  Hyperkalemia, resolved  Medical noncompliance (per wife)  Documented polysubstance abuse                                                                          DISCHARGE PLAN BY DIAGNOSIS     DKA  DM   Discharge Plan: -Follow up with [facility] -Blood glucose meter, needles & syringes given at d/c with #1 refill -Continue Novolog 70/30, 55 units in am & 45 units at night, #1 refill  -TID 4 units novolog for CBG > 200 (simplified regimen in an effort to gain compliance) -stressed importance of follow up, drug cessation and self care -instructed patient to record glucose readings until seen by Clinic and bring with him to the appt  AKI Hyperkalemia  Discharge Plan: -Resolved, no further follow up at present  Medical noncompliance (per wife)  Documented polysubstance abuse  Discharge Plan: -Counseled at time of discharge                  DISCHARGE SUMMARY   Luke [name] is a 33 y.o. 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  Work up found the patient to be in DKA . BS on arrival >1200, he was very acidotic with pH 7.1, severe hyperkalemia with K+ 7.8 and renal failure withscr up to 3.5 (baseline 1.3).  Patients wife endorses that he buys over the counter Insulin and does not have a local PCP or endocrinologist. He continues to use IV drugs. Tox screen was positive for opiates and cocaine. He was treated with IVF resusciatation, 2 L of NS given in ER . His BP was very elevated on presentation with ST /ectopy  & was also noted to have an episode of wide complex QRS / tachycardia.  Patient was admitted to ICU with insulin gtt, aggressively volume resuscitated and monitored for slow correction of glucose.  Patient's blood glucose corrected and anion gap closed.  He was transitioned off insulin gtt and returned to prior home regimen.      SIGNIFICANT DIAGNOSTIC STUDIES 9/07 UDS >> positive for cocaine & opiates  MICRO DATA  9/07 UC >> neg    Discharge Exam: General:  wdwn adult male in NAD Neuro: AAOx4, MAE CV: s1s2 rrr, no m/r/g PULM: resp's even/non-labored, lungs bilaterally clear GI: round, soft, bsx4 active  Extremities: warm/dry, no edema   Filed Vitals:   11/24/13 1948 11/24/13 2245 11/25/13 0425 11/25/13 0700  BP: 140/76 132/75 126/81   Pulse: 112 89 82   Temp: 98.9 F (37.2 C) 99.2 F (37.3 C) 98 F (36.7 C) 99 F (37.2 C)  TempSrc: Oral Oral Oral Oral  Resp: _0 Height:      Weight:      SpO2: 97% 99% 98%  Discharge Labs  BMET  Recent Labs Lab 11/23/13 1840 11/23/13 2058 11/24/13 0530 11/24/13 1450 11/25/13 0655  NA 139 138 133* 136* 136*  K 4.4 QUANTITY NOT SUFFICIENT, UNABLE TO PERFORM TEST 4.4 3.9 3.7  CL 98 101 95* 98 101  CO2 20 16* 18* 22 23  GLUCOSE 377* 177* 259* 107* 154*  BUN 40* 40* 32* 24* 16  CREATININE 2.37* 2.10* 1.60* 1.29 1.09  CALCIUM 9.3 9.2 8.4 8.9 8.8  MG  --   --  1.7  --   --   PHOS  --   --  3.0  --   --     CBC  Recent Labs Lab 11/23/13 1137 11/23/13 1215 11/23/13 1422 11/24/13 0530  HGB 11.6* 14.3 11.4* 10.2*  HCT 40.2 42.0 34.9* 30.1*  WBC 27.4*  --  21.3* 17.4*  PLT 437*  --  351 307        Follow-up Information   Follow up with [facility]     On 12/03/2013. (Appt at 9:00 AM.  VERY IMPORTANT THAT YOU KEEP THIS APPOINTMENT!)    Contact information:   [address] [place] [address] [phone]        Medication List         blood glucose meter kit  and supplies  Dispense based on patient and insurance preference. Use up to four times daily as directed. (FOR ICD-9 250.00, 250.01).     esomeprazole 40 MG capsule  Commonly known as:  NEXIUM  Take 40 mg by mouth daily before breakfast.     insulin aspart 100 UNIT/ML injection  Commonly known as:  novoLOG  Inject 4 Units into the skin 3 (three) times daily with meals as needed for high blood sugar. For blood glucose greater than 200 TID     insulin aspart protamine- aspart (70-30) 100 UNIT/ML injection  Commonly known as:  NOVOLOG MIX 70/30  Inject 0.45-0.55 mLs (45-55 Units total) into the skin 2 (two) times daily with a meal. Uses 55 units in the morning and 45 units at night     Insulin Syringes (Disposable) U-100 0.5 ML Misc  1 each by Does not apply route 3 (three) times daily as needed.     Pen Needles 3/16" 31G X 5 MM Misc  1 each by Does not apply route 3 (three) times daily as needed.          Disposition:  Home.  No new home health needs identified   Discharged Condition: [name] has met maximum benefit of inpatient care and is medically stable and cleared for discharge.  Patient is pending follow up as above.      Time spent on disposition:  Greater than 35 minutes.   Signed: Noe Gens, NP-C [facility] Pgr: [phone] Office: [phone]    Merton Border, MD ; [facility] [phone].  After 5:30 PM or weekends, call [phone]

## 2013-11-25 NOTE — Progress Notes (Signed)
Pt to d/c home today. Reviewed inst, to p/up meds, f/up appt, et all.

## 2013-12-03 ENCOUNTER — Ambulatory Visit: Payer: Medicaid Other | Admitting: Internal Medicine

## 2013-12-10 ENCOUNTER — Ambulatory Visit (HOSPITAL_BASED_OUTPATIENT_CLINIC_OR_DEPARTMENT_OTHER): Payer: Medicaid Other | Admitting: Family Medicine

## 2013-12-10 ENCOUNTER — Encounter: Payer: Self-pay | Admitting: Family Medicine

## 2013-12-10 ENCOUNTER — Other Ambulatory Visit: Payer: Self-pay | Admitting: Family Medicine

## 2013-12-10 ENCOUNTER — Telehealth: Payer: Self-pay | Admitting: Family Medicine

## 2013-12-10 ENCOUNTER — Emergency Department (HOSPITAL_COMMUNITY)
Admission: EM | Admit: 2013-12-10 | Discharge: 2013-12-10 | Disposition: A | Discharge status: Home / Self Care | Payer: Medicaid Other | Attending: Emergency Medicine | Admitting: Emergency Medicine

## 2013-12-10 ENCOUNTER — Encounter (HOSPITAL_COMMUNITY): Payer: Self-pay | Admitting: Emergency Medicine

## 2013-12-10 VITALS — BP 147/88 | HR 105 | Temp 98.6°F | Resp 16 | Ht 69.0 in | Wt 169.0 lb

## 2013-12-10 DIAGNOSIS — N058 Unspecified nephritic syndrome with other morphologic changes: Secondary | ICD-10-CM | POA: Insufficient documentation

## 2013-12-10 DIAGNOSIS — E1165 Type 2 diabetes mellitus with hyperglycemia: Secondary | ICD-10-CM | POA: Insufficient documentation

## 2013-12-10 DIAGNOSIS — Z8701 Personal history of pneumonia (recurrent): Secondary | ICD-10-CM | POA: Insufficient documentation

## 2013-12-10 DIAGNOSIS — F1110 Opioid abuse, uncomplicated: Secondary | ICD-10-CM | POA: Insufficient documentation

## 2013-12-10 DIAGNOSIS — F17200 Nicotine dependence, unspecified, uncomplicated: Secondary | ICD-10-CM | POA: Insufficient documentation

## 2013-12-10 DIAGNOSIS — Z862 Personal history of diseases of the blood and blood-forming organs and certain disorders involving the immune mechanism: Secondary | ICD-10-CM | POA: Insufficient documentation

## 2013-12-10 DIAGNOSIS — Z8669 Personal history of other diseases of the nervous system and sense organs: Secondary | ICD-10-CM | POA: Diagnosis not present

## 2013-12-10 DIAGNOSIS — N179 Acute kidney failure, unspecified: Secondary | ICD-10-CM | POA: Insufficient documentation

## 2013-12-10 DIAGNOSIS — Z8659 Personal history of other mental and behavioral disorders: Secondary | ICD-10-CM | POA: Insufficient documentation

## 2013-12-10 DIAGNOSIS — E119 Type 2 diabetes mellitus without complications: Secondary | ICD-10-CM | POA: Insufficient documentation

## 2013-12-10 DIAGNOSIS — K219 Gastro-esophageal reflux disease without esophagitis: Secondary | ICD-10-CM | POA: Insufficient documentation

## 2013-12-10 DIAGNOSIS — R7309 Other abnormal glucose: Secondary | ICD-10-CM

## 2013-12-10 DIAGNOSIS — F1910 Other psychoactive substance abuse, uncomplicated: Secondary | ICD-10-CM

## 2013-12-10 DIAGNOSIS — F1410 Cocaine abuse, uncomplicated: Secondary | ICD-10-CM

## 2013-12-10 DIAGNOSIS — R000 Tachycardia, unspecified: Secondary | ICD-10-CM | POA: Diagnosis not present

## 2013-12-10 DIAGNOSIS — IMO0002 Reserved for concepts with insufficient information to code with codable children: Secondary | ICD-10-CM

## 2013-12-10 DIAGNOSIS — E1129 Type 2 diabetes mellitus with other diabetic kidney complication: Secondary | ICD-10-CM | POA: Insufficient documentation

## 2013-12-10 DIAGNOSIS — Z79899 Other long term (current) drug therapy: Secondary | ICD-10-CM | POA: Insufficient documentation

## 2013-12-10 DIAGNOSIS — Z794 Long term (current) use of insulin: Secondary | ICD-10-CM

## 2013-12-10 DIAGNOSIS — R739 Hyperglycemia, unspecified: Secondary | ICD-10-CM

## 2013-12-10 DIAGNOSIS — E860 Dehydration: Secondary | ICD-10-CM

## 2013-12-10 LAB — URINALYSIS, ROUTINE W REFLEX MICROSCOPIC
Bilirubin Urine: NEGATIVE
HGB URINE DIPSTICK: NEGATIVE
Ketones, ur: 40 mg/dL — AB
Leukocytes, UA: NEGATIVE
Nitrite: NEGATIVE
PROTEIN: NEGATIVE mg/dL
Specific Gravity, Urine: 1.027 (ref 1.005–1.030)
Urobilinogen, UA: 0.2 mg/dL (ref 0.0–1.0)
pH: 5.5 (ref 5.0–8.0)

## 2013-12-10 LAB — CBC WITH DIFFERENTIAL/PLATELET
Basophils Absolute: 0.1 10*3/uL (ref 0.0–0.1)
Basophils Relative: 1 % (ref 0–1)
Eosinophils Absolute: 0.1 10*3/uL (ref 0.0–0.7)
Eosinophils Relative: 1 % (ref 0–5)
HCT: 36.2 % — ABNORMAL LOW (ref 39.0–52.0)
Hemoglobin: 12 g/dL — ABNORMAL LOW (ref 13.0–17.0)
LYMPHS ABS: 4 10*3/uL (ref 0.7–4.0)
LYMPHS PCT: 31 % (ref 12–46)
MCH: 28 pg (ref 26.0–34.0)
MCHC: 33.1 g/dL (ref 30.0–36.0)
MCV: 84.4 fL (ref 78.0–100.0)
Monocytes Absolute: 0.8 10*3/uL (ref 0.1–1.0)
Monocytes Relative: 6 % (ref 3–12)
NEUTROS ABS: 8 10*3/uL — AB (ref 1.7–7.7)
NEUTROS PCT: 61 % (ref 43–77)
Platelets: 577 10*3/uL — ABNORMAL HIGH (ref 150–400)
RBC: 4.29 MIL/uL (ref 4.22–5.81)
RDW: 15.1 % (ref 11.5–15.5)
WBC: 12.9 10*3/uL — AB (ref 4.0–10.5)

## 2013-12-10 LAB — POCT URINALYSIS DIPSTICK
BILIRUBIN UA: NEGATIVE
Blood, UA: NEGATIVE
Glucose, UA: 500
KETONES UA: 80
Leukocytes, UA: NEGATIVE
Nitrite, UA: NEGATIVE
Protein, UA: NEGATIVE
Spec Grav, UA: 1.01
Urobilinogen, UA: 0.2
pH, UA: 6

## 2013-12-10 LAB — CBG MONITORING, ED
GLUCOSE-CAPILLARY: 479 mg/dL — AB (ref 70–99)
Glucose-Capillary: 398 mg/dL — ABNORMAL HIGH (ref 70–99)
Glucose-Capillary: 268 mg/dL — ABNORMAL HIGH (ref 70–99)

## 2013-12-10 LAB — I-STAT CHEM 8, ED
BUN: 24 mg/dL — AB (ref 6–23)
CALCIUM ION: 1.08 mmol/L — AB (ref 1.12–1.23)
Chloride: 100 mEq/L (ref 96–112)
Creatinine, Ser: 1.5 mg/dL — ABNORMAL HIGH (ref 0.50–1.35)
Glucose, Bld: 486 mg/dL — ABNORMAL HIGH (ref 70–99)
HEMATOCRIT: 40 % (ref 39.0–52.0)
HEMOGLOBIN: 13.6 g/dL (ref 13.0–17.0)
Potassium: 4.1 mEq/L (ref 3.7–5.3)
SODIUM: 125 meq/L — AB (ref 137–147)
TCO2: 22 mmol/L (ref 0–100)

## 2013-12-10 LAB — GLUCOSE, POCT (MANUAL RESULT ENTRY): POC Glucose: HIGH mg/dl (ref 70–99)

## 2013-12-10 LAB — POCT GLYCOSYLATED HEMOGLOBIN (HGB A1C): Hemoglobin A1C: 11.9

## 2013-12-10 LAB — RAPID URINE DRUG SCREEN, HOSP PERFORMED
AMPHETAMINES: NOT DETECTED
Barbiturates: NOT DETECTED
Benzodiazepines: NOT DETECTED
COCAINE: POSITIVE — AB
OPIATES: POSITIVE — AB
TETRAHYDROCANNABINOL: NOT DETECTED

## 2013-12-10 LAB — I-STAT CG4 LACTIC ACID, ED: Lactic Acid, Venous: 3.59 mmol/L — ABNORMAL HIGH (ref 0.5–2.2)

## 2013-12-10 LAB — URINE MICROSCOPIC-ADD ON

## 2013-12-10 MED ORDER — INSULIN PEN NEEDLE 31G X 8 MM MISC: 1.0000 | Freq: Every day | Status: DC

## 2013-12-10 MED ORDER — INSULIN ASPART PROT & ASPART (70-30 MIX) 100 UNIT/ML ~~LOC~~ SUSP: 50.0000 [IU] | Freq: Two times a day (BID) | SUBCUTANEOUS | Status: DC

## 2013-12-10 MED ORDER — SODIUM CHLORIDE 0.9 % IV BOLUS (SEPSIS)
1000.0000 mL | Freq: Once | INTRAVENOUS | Status: AC
  Administered 2013-12-10: 1000 mL via INTRAVENOUS

## 2013-12-10 MED ORDER — INSULIN NPH ISOPHANE & REGULAR (70-30) 100 UNIT/ML ~~LOC~~ SUSP: 50.0000 [IU] | Freq: Two times a day (BID) | SUBCUTANEOUS | Status: DC

## 2013-12-10 MED ORDER — INSULIN ASPART 100 UNIT/ML ~~LOC~~ SOLN
20.0000 [IU] | Freq: Once | SUBCUTANEOUS | Status: AC
  Administered 2013-12-10: 20 [IU] via SUBCUTANEOUS

## 2013-12-10 MED ORDER — GLUCOSE BLOOD VI STRP: 1.0000 | ORAL_STRIP | Freq: Three times a day (TID) | Status: DC

## 2013-12-10 MED ORDER — ACCU-CHEK FASTCLIX LANCETS MISC: 1.0000 | Freq: Three times a day (TID) | Status: DC

## 2013-12-10 MED ORDER — INSULIN ASPART 100 UNIT/ML ~~LOC~~ SOLN
5.0000 [IU] | Freq: Once | SUBCUTANEOUS | Status: AC
  Administered 2013-12-10: 5 [IU] via INTRAVENOUS
  Filled 2013-12-10: qty 1

## 2013-12-10 NOTE — Progress Notes (Signed)
   Subjective:    Patient ID: Luke [name], male    DOB: 08/08/1980, 33 y.o.   MRN: [id number] CC: establish care, IDDM type 2, substance abuse   HPI 33 year old male presents to establish care discussed the following:  #1 insulin-dependent diabetes mellitus type 2: Patient with diabetes for the past 7 years. Patient's insulin-dependent. Patient ran out of his NovoLog 70/30 mix to days ago. Patient last took regular NovoLog and 19 units last night. Blood sugar last night was 519. Patient admits to fatigue. Patient denies nausea, vomiting, chest pain, shortness of breath, fever, dysuria.  #2 substance abuse: Patient last used heroin and cocaine 2 days ago. Patient has history of multiple abscesses requiring I&D on his arms which heroin use. Patient does endorse multiple chronic lesions on his arms right more than left. Patient denies injecting into his neck, legs or feet. Patient denies fever chills.  Soc hx: Current everyday smoker  Review of Systems As per HPI     Objective:   Physical Exam BP 147/88  Pulse 105  Temp(Src) 98.6 F (37 C) (Oral)  Resp 16  Ht 5\' 9"  (1.753 m)  Wt 169 lb (76.658 kg)  BMI 24.95 kg/m2  SpO2 95% General appearance: alert, cooperative and no distress Lungs: clear to auscultation bilaterally Heart: regular rate and rhythm, S1, S2 normal, no murmur, click, rub or gallop Abdomen: soft, non-tender; bowel sounds normal; no masses,  no organomegaly Extremities: multiple shallow ulcers with surrounding erythema and induration on both arms R >L, no fluctuance, no drainage, LN: no axillary adenopathy   Patient blood sugar was checked and found to be unreadable, suggesting sugar > 600. Lab Results  Component Value Date   HGBA1C 11.9 12/10/2013   Patient's urinalysis was obtained and reviewed he had 500 glucose 80 ketones.  Patient was given NovoLog 20 units subcutaneous. Patient was given 20 ounces of water and to cereal bar since he has not eaten  today.  Pressure patient transferred to the ED for further evaluation and treatment of suspected DKA.     Assessment & Plan:

## 2013-12-10 NOTE — ED Notes (Signed)
PA at bedside.

## 2013-12-10 NOTE — ED Provider Notes (Signed)
Patient is a known diabetic. He had medications in the last couple of days. He was sent in from his primary doctor's office because of hyperglycemia. Physical Exam  BP 143/77  Pulse 106  Temp(Src) 98.7 F (37.1 C) (Oral)  Resp 17  SpO2 97%  Physical Exam  Nursing note and vitals reviewed. Constitutional: He appears well-developed and well-nourished. No distress.  HENT:  Head: Normocephalic and atraumatic.  Right Ear: External ear normal.  Left Ear: External ear normal.  Eyes: Conjunctivae are normal. Right eye exhibits no discharge. Left eye exhibits no discharge. No scleral icterus.  Neck: Neck supple. No tracheal deviation present.  Cardiovascular: Normal rate.   Pulmonary/Chest: Effort normal. No stridor. No respiratory distress.  Musculoskeletal: He exhibits no edema.  Neurological: He is alert. Cranial nerve deficit: no gross deficits.  Skin: Skin is warm and dry. No rash noted.  Psychiatric: He has a normal mood and affect.    ED Course  Procedures  MDM Patient does not appear to be in any distress. He has not had any trouble with vomiting or diarrhea.  Will check labs to assess for DKA, administer IV fluids and reasess      Dorie Rank, MD 12/10/13 1304

## 2013-12-10 NOTE — ED Notes (Signed)
NOTIFIED DR. Lenna Sciara. [facility] IN PERSON FOR PATIENTS PANIC LAB RESULTS OF CG4+ LACTIC ACID = 3.59 mmol// ,@13 :00 PM ,12/10/2013.

## 2013-12-10 NOTE — ED Notes (Signed)
Pt here for hyperglycemia sent by PCP for further eval; pt admitted recently for hyperkalemia and DKA; pt sts taking insulin; pt with hx of IV drug use

## 2013-12-10 NOTE — Telephone Encounter (Signed)
Called patient to f/u ED visit. He is feeling better. He is in line at pharmacy on site to pick up novolin 70/30 He need lancets, pen needles and test stips  Refilled supplies.   Patient to schedule f/u appt with me for next week.

## 2013-12-10 NOTE — Assessment & Plan Note (Signed)
A: uncontrolled, concern for DKA  P:  Transfer to ED for further evaluation and treatment. Patient to f/u with me once medically stable, will need to make a f/u appt.

## 2013-12-10 NOTE — ED Notes (Signed)
Pt wheeled back to room. Pt placed into gown and on monitor upon arrival to room. Pt monitored by blood pressure, pulse ox, and 5 lead.

## 2013-12-10 NOTE — Discharge Instructions (Signed)
STAY VERY WELL HYDRATED WITH WATER, DO NOT DRINK COFFEE/TEA OR SUGARY BEVERAGES! Keep drinking lots of water! Call your doctor when you leave here and follow up with her for insulin regimen adjustments/ongoing management, and take your insulin as directed. Avoid carbohydrates, and follow the diet plan below to help with your sugars. You MUST stop using drugs, this is harmful to your body and is causing many issues that could be life threatening. Use the resource guide below to find a substance abuse specialist. You also need to see an ophthalmologist to evaluate your eyes, which is necessary on a yearly basis. Return to the ER for changes or worsening of symptoms.   Hyperglycemia Hyperglycemia occurs when the glucose (sugar) in your blood is too high. Hyperglycemia can happen for many reasons, but it most often happens to people who do not know they have diabetes or are not managing their diabetes properly.  CAUSES  Whether you have diabetes or not, there are other causes of hyperglycemia. Hyperglycemia can occur when you have diabetes, but it can also occur in other situations that you might not be as aware of, such as: Diabetes  If you have diabetes and are having problems controlling your blood glucose, hyperglycemia could occur because of some of the following reasons:  Not following your meal plan.  Not taking your diabetes medications or not taking it properly.  Exercising less or doing less activity than you normally do.  Being sick. Pre-diabetes  This cannot be ignored. Before people develop Type 2 diabetes, they almost always have "pre-diabetes." This is when your blood glucose levels are higher than normal, but not yet high enough to be diagnosed as diabetes. Research has shown that some long-term damage to the body, especially the heart and circulatory system, may already be occurring during pre-diabetes. If you take action to manage your blood glucose when you have pre-diabetes, you  may delay or prevent Type 2 diabetes from developing. Stress  If you have diabetes, you may be "diet" controlled or on oral medications or insulin to control your diabetes. However, you may find that your blood glucose is higher than usual in the hospital whether you have diabetes or not. This is often referred to as "stress hyperglycemia." Stress can elevate your blood glucose. This happens because of hormones put out by the body during times of stress. If stress has been the cause of your high blood glucose, it can be followed regularly by your caregiver. That way he/she can make sure your hyperglycemia does not continue to get worse or progress to diabetes. Steroids  Steroids are medications that act on the infection fighting system (immune system) to block inflammation or infection. One side effect can be a rise in blood glucose. Most people can produce enough extra insulin to allow for this rise, but for those who cannot, steroids make blood glucose levels go even higher. It is not unusual for steroid treatments to "uncover" diabetes that is developing. It is not always possible to determine if the hyperglycemia will go away after the steroids are stopped. A special blood test called an A1c is sometimes done to determine if your blood glucose was elevated before the steroids were started. SYMPTOMS  Thirsty.  Frequent urination.  Dry mouth.  Blurred vision.  Tired or fatigue.  Weakness.  Sleepy.  Tingling in feet or leg. DIAGNOSIS  Diagnosis is made by monitoring blood glucose in one or all of the following ways:  A1c test. This is a chemical  found in your blood.  Fingerstick blood glucose monitoring.  Laboratory results. TREATMENT  First, knowing the cause of the hyperglycemia is important before the hyperglycemia can be treated. Treatment may include, but is not be limited to:  Education.  Change or adjustment in medications.  Change or adjustment in meal  plan.  Treatment for an illness, infection, etc.  More frequent blood glucose monitoring.  Change in exercise plan.  Decreasing or stopping steroids.  Lifestyle changes. HOME CARE INSTRUCTIONS   Test your blood glucose as directed.  Exercise regularly. Your caregiver will give you instructions about exercise. Pre-diabetes or diabetes which comes on with stress is helped by exercising.  Eat wholesome, balanced meals. Eat often and at regular, fixed times. Your caregiver or nutritionist will give you a meal plan to guide your sugar intake.  Being at an ideal weight is important. If needed, losing as little as 10 to 15 pounds may help improve blood glucose levels. SEEK MEDICAL CARE IF:   You have questions about medicine, activity, or diet.  You continue to have symptoms (problems such as increased thirst, urination, or weight gain). SEEK IMMEDIATE MEDICAL CARE IF:   You are vomiting or have diarrhea.  Your breath smells fruity.  You are breathing faster or slower.  You are very sleepy or incoherent.  You have numbness, tingling, or pain in your feet or hands.  You have chest pain.  Your symptoms get worse even though you have been following your caregiver's orders.  If you have any other questions or concerns. Document Released: 08/29/2000 Document Revised: 05/28/2011 Document Reviewed: 07/02/2011 [facility] Patient Information 2015 [place], [place]. This information is not intended to replace advice given to you by your health care provider. Make sure you discuss any questions you have with your health care provider.  How to Avoid Diabetes Problems You can do a lot to prevent or slow down diabetes problems. Following your diabetes plan and taking care of yourself can reduce your risk of serious or life-threatening complications. Below, you will find certain things you can do to prevent diabetes problems. MANAGE YOUR DIABETES Follow your health care provider's, nurse  educator's, and dietitian's instructions for managing your diabetes. They will teach you the basics of diabetes care. They can help answer questions you may have. Learn about diabetes and make healthy choices regarding eating and physical activity. Monitor your blood glucose level regularly. Your health care provider will help you decide how often to check your blood glucose level depending on your treatment goals and how well you are meeting them.  DO NOT USE NICOTINE Nicotine and diabetes are a dangerous combination. Nicotine raises your risk for diabetes problems. If you quit using nicotine, you will lower your risk for heart attack, stroke, nerve disease, and kidney disease. Your cholesterol and your blood pressure levels may improve. Your blood circulation will also improve. Do not use any tobacco products, including cigarettes, chewing tobacco, or electronic cigarettes. If you need help quitting, ask your health care provider. KEEP YOUR BLOOD PRESSURE UNDER CONTROL Keeping your blood pressure under control will help prevent damage to your eyes, kidneys, heart, and blood vessels. Blood pressure consists of two numbers. The top number should be below 120, and the bottom number should be below 80 (120/80). Keep your blood pressure as close to these numbers as you can. If you already have kidney disease, you may want even lower blood pressure to protect your kidneys. Talk to your health care provider to make sure  that your blood pressure goal is right for your needs. Meal planning, medicines, and exercise can help you reach your blood pressure target. Have your blood pressure checked at every visit with your health care provider. KEEP YOUR CHOLESTEROL UNDER CONTROL Normal cholesterol levels will help prevent heart disease and stroke. These are the biggest health problems for people with diabetes. Keeping cholesterol levels under control can also help with blood flow. Have your cholesterol level checked at  least once a year. Your health care provider may prescribe a medicine known as a statin. Statins lower your cholesterol. If you are not taking a statin, ask your health care provider if you should be. Meal planning, exercise, and medicines can help you reach your cholesterol targets.  SCHEDULE AND KEEP YOUR ANNUAL PHYSICAL EXAMS AND EYE EXAMS Your health care provider will tell you how often he or she wants to see you depending on your plan of treatment. It is important that you keep these appointments so that possible problems can be identified early and complications can be avoided or treated.  Every visit with your health care provider should include your weight, blood pressure, and an evaluation of your blood glucose control.  Your hemoglobin A1c should be checked:  At least twice a year if you are at your goal.  Every 3 months if there are changes in treatment.  If you are not meeting your goals.  Your blood lipids should be checked yearly. You should also be checked yearly to see if you have protein in your urine (microalbumin).  Schedule a dilated eye exam within 5 years of your diagnosis if you have type 1 diabetes, and then yearly. Schedule a dilated eye exam at diagnosis if you have type 2 diabetes, and then yearly. All exams thereafter can be extended to every 2 to 3 years if one or more exams have been normal. KEEP YOUR VACCINES CURRENT The flu vaccine is recommended yearly. The formula for the vaccine changes every year and needs to be updated for the best protection against current viruses. It is recommended that people with diabetes who are over 78 years old get the pneumonia vaccine. In some cases, two separate shots may be given. Ask your health care provider if your pneumonia vaccination is up-to-date. However, there are some instances where another vaccine is recommended. Check with your health care provider. TAKE CARE OF YOUR FEET  Diabetes may cause you to have a poor blood  supply (circulation) to your legs and feet. Because of this, the skin may be thinner, break easier, and heal more slowly. You also may have nerve damage in your legs and feet, causing decreased feeling. You may not notice minor injuries to your feet that could lead to serious problems or infections. Taking care of your feet is very important. Visual foot exams are performed at every routine medical visit. The exams check for cuts, injuries, or other problems with the feet. A comprehensive foot exam should be done yearly. This includes visual inspection as well as assessing foot pulses and testing for loss of sensation. You should also do the following:  Inspect your feet daily for cuts, calluses, blisters, ingrown toenails, and signs of infection, such as redness, swelling, or pus.  Wash and dry your feet thoroughly, especially between the toes.  Avoid soaking your feet regularly in hot water baths.  Moisturize dry skin with lotion, avoiding areas between your toes.  Cut toenails straight across and file the edges.  Avoid shoes that  do not fit well or have areas that irritate your skin.  Avoid going barefooted or wearing only socks. Your feet need protection. TAKE CARE OF YOUR TEETH People with poorly controlled diabetes are more likely to have gum (periodontal) disease. These infections make diabetes harder to control. Periodontal diseases, if left untreated, can lead to tooth loss. Brush your teeth twice a day, floss, and see your dentist for checkups and cleaning every 6 months, or 2 times a year. ASK YOUR HEALTH CARE PROVIDER ABOUT TAKING ASPIRIN Taking aspirin daily is recommended to help prevent cardiovascular disease in people with and without diabetes. Ask your health care provider if this would benefit you and what dose he or she would recommend. DRINK RESPONSIBLY Moderate amounts of alcohol (less than 1 drink per day for adult women and less than 2 drinks per day for adult men) have a  minimal effect on blood glucose if ingested with food. It is important to eat food with alcohol to avoid hypoglycemia. People should avoid alcohol if they have a history of alcohol abuse or dependence, if they are pregnant, and if they have liver disease, pancreatitis, advanced neuropathy, or severe hypertriglyceridemia. LESSEN STRESS Living with diabetes can be stressful. When you are under stress, your blood glucose may be affected in two ways:  Stress hormones may cause your blood glucose to rise.  You may be distracted from taking good care of yourself. It is a good idea to be aware of your stress level and make changes that are necessary to help you better manage challenging situations. Support groups, planned relaxation, a hobby you enjoy, meditation, healthy relationships, and exercise all work to lower your stress level. If your efforts do not seem to be helping, get help from your health care provider or a trained mental health professional. Document Released: 11/21/2010 Document Revised: 07/20/2013 Document Reviewed: 04/29/2013 [facility] Patient Information 2015 [place], [place]. This information is not intended to replace advice given to you by your health care provider. Make sure you discuss any questions you have with your health care provider.  Serving Sizes What we call a serving size today is larger than it was in the past. A 1950s fast-food burger contained little more than 1 oz of meat, and a soft drink was 8 oz (1 cup). Today, a "quarter pounder" burger is at least 4 times that amount, and a 32 or 64 oz drink is not uncommon. A possible guide for eating when trying to lose weight is to eat about half as much as you normally do. Some estimates of serving sizes are:  1 Dairy serving:Individual container of yogurt (8 oz) or piece of cheese the size of your thumb (1 oz).  1 Grain serving: 1 slice of bread or  cup pasta.  1 Meat serving: The size of a deck of cards (3 oz).  1 Fruit  serving: cup canned fruit or 1 medium fruit.  1 Vegetable serving:  cup of cooked or canned vegetables.  1 Fat serving:The size of 4 stacked dimes. Experts suggest spending 1 or 2 days measuring food portions you commonly eat. This will give you better practice at estimating serving sizes, and will also show whether you are eating an appropriate amount of food to meet your weight goals. If you find that you are eating more than you thought, try measuring your food for a few days so you can "reprogram" yourself to learn what makes a healthy portion for you. SUGGESTIONS FOR CONTROL  In restaurants,  share entrees, or ask the waiter to put half the entre in a box or bag before you even touch it.  Order lunch-sized portions. Many restaurants serve 4 to 6 oz of meat at lunch, compared with 8 to 10 oz at dinner.  Split dessert or skip it all together. Have a piece of fruit when you get home.  At home, use smaller plates and bowls. It will look as if you are eating more.  Plate your food in the kitchen rather than serving it "family style" at the table.  Wait 20 to 30 minutes before taking seconds. This is how long it takes your brain to recognize that you are full.  Check food labels for serving sizes. Eat 1 serving only.  Use measuring cups and spoons to see proper serving sizes.  Buy smaller packages of candy, popcorn, and snacks.  Avoid eating directly out of the bag or carton.  While eating half as much, exercise twice as much. Park further away from the mall, take the stairs instead of the escalator, and walk around your block. Losing weight is a slow, difficult process. It takes long-lasting lifestyle changes. You can make gradual changes over time so they become habits. Look to friends and family to support the healthy changes you are making. Avoid fad diets since they are often only temporary weight loss solutions. Document Released: 12/02/2002 Document Revised: 05/28/2011 Document  Reviewed: 06/02/2013 [facility] Patient Information 2015 [place], [place]. This information is not intended to replace advice given to you by your health care provider. Make sure you discuss any questions you have with your health care provider.  Diabetes Mellitus and Food It is important for you to manage your blood sugar (glucose) level. Your blood glucose level can be greatly affected by what you eat. Eating healthier foods in the appropriate amounts throughout the day at about the same time each day will help you control your blood glucose level. It can also help slow or prevent worsening of your diabetes mellitus. Healthy eating may even help you improve the level of your blood pressure and reach or maintain a healthy weight.  HOW CAN FOOD AFFECT ME? Carbohydrates Carbohydrates affect your blood glucose level more than any other type of food. Your dietitian will help you determine how many carbohydrates to eat at each meal and teach you how to count carbohydrates. Counting carbohydrates is important to keep your blood glucose at a healthy level, especially if you are using insulin or taking certain medicines for diabetes mellitus. Alcohol Alcohol can cause sudden decreases in blood glucose (hypoglycemia), especially if you use insulin or take certain medicines for diabetes mellitus. Hypoglycemia can be a life-threatening condition. Symptoms of hypoglycemia (sleepiness, dizziness, and disorientation) are similar to symptoms of having too much alcohol.  If your health care provider has given you approval to drink alcohol, do so in moderation and use the following guidelines:  Women should not have more than one drink per day, and men should not have more than two drinks per day. One drink is equal to:  12 oz of beer.  5 oz of wine.  1 oz of hard liquor.  Do not drink on an empty stomach.  Keep yourself hydrated. Have water, diet soda, or unsweetened iced tea.  Regular soda, juice, and other  mixers might contain a lot of carbohydrates and should be counted. WHAT FOODS ARE NOT RECOMMENDED? As you make food choices, it is important to remember that all foods are not the  same. Some foods have fewer nutrients per serving than other foods, even though they might have the same number of calories or carbohydrates. It is difficult to get your body what it needs when you eat foods with fewer nutrients. Examples of foods that you should avoid that are high in calories and carbohydrates but low in nutrients include:  Trans fats (most processed foods list trans fats on the Nutrition Facts label).  Regular soda.  Juice.  Candy.  Sweets, such as cake, pie, doughnuts, and cookies.  Fried foods. WHAT FOODS CAN I EAT? Have nutrient-rich foods, which will nourish your body and keep you healthy. The food you should eat also will depend on several factors, including:  The calories you need.  The medicines you take.  Your weight.  Your blood glucose level.  Your blood pressure level.  Your cholesterol level. You also should eat a variety of foods, including:  Protein, such as meat, poultry, fish, tofu, nuts, and seeds (lean animal proteins are best).  Fruits.  Vegetables.  Dairy products, such as milk, cheese, and yogurt (low fat is best).  Breads, grains, pasta, cereal, rice, and beans.  Fats such as olive oil, trans fat-free margarine, canola oil, avocado, and olives. DOES EVERYONE WITH DIABETES MELLITUS HAVE THE SAME MEAL PLAN? Because every person with diabetes mellitus is different, there is not one meal plan that works for everyone. It is very important that you meet with a dietitian who will help you create a meal plan that is just right for you. Document Released: 11/30/2004 Document Revised: 03/10/2013 Document Reviewed: 01/30/2013 [facility] Patient Information 2015 [place], [place]. This information is not intended to replace advice given to you by your health care  provider. Make sure you discuss any questions you have with your health care provider.  Diabetic Retinopathy Diabetic retinopathy is a disease of the light-sensitive membrane at the back of the eye (retina). It is a complication of diabetes and a common cause of blindness. Early detection of the disease is key to keeping your eyes healthy.  CAUSES  Diabetic retinopathy is caused by blood sugar (glucose) levels that are too high over an extended period of time. High blood sugars cause damage to the small blood vessels of the retina, allowing blood to leak through the vessel walls. This causes visual impairment and eventually blindness. RISK FACTORS  High blood pressure.  Having diabetes for a long time.  Having poorly controlled blood sugars. SIGNS AND SYMPTOMS  In the early stages of diabetic retinopathy, there are often no symptoms. As the condition advances, symptoms may include:  Blurred vision. This is usually caused by a swelling due to abnormal blood glucose levels. The blurriness may go away when blood glucose levels return to normal.  Moving specks or dark spots (floaters) in your vision. These can be caused by a small retinal hemorrhage. A hemorrhage is bleeding from blood vessels.  Missing parts of your field of vision, such as things at the side. This can be caused by larger retinal hemorrhages.  Difficulty reading books or signs.  Double vision.  Pain in one or both eyes.  Feeling pressure in one or both eyes.  Trouble seeing straight lines. Straight lines do not look straight.  Redness of the eyes that does not go away. DIAGNOSIS  Your eye care specialist can detect changes in the blood vessels of your eye by putting drops in your eyes that enlarge (dilate) your pupils. This allows your eye care specialist to get  a good look at your retina to see if there are any changes that have occurred as a result of your diabetes. You should have your eyes examined once a  year. TREATMENT  Your eye care specialist may use a special laser beam to seal the blood vessels of the retina and stop them from leaking. Early detection and treatment are important so that further damage to your eyes can be prevented. In addition, managing your blood sugars and keeping them in the target range can slow the progress of the disease. HOME CARE INSTRUCTIONS   Keep your blood pressure within your target range.  Keep your blood glucose levels within your target range.  Follow your health care provider's instructions regarding diet and other means for controlling your blood glucose levels.  Check your blood levels for glucose as recommended by your health care provider.  Keep regular appointments with your eye specialist. An eye specialist can usually see diabetic retinopathy developing long before it starts causing problems. In many cases, it can be treated to prevent complications from occurring. If you have diabetes, you should have your eyes checked at least every year. Your risk of retinopathy increases the longer you have the disease.  If you smoke, quit. Ask your health care provider for help if needed. Smoking can make retinopathy worse. SEEK MEDICAL CARE IF:   You notice gradual blurring or other changes in your vision over time.  You notice that your glasses or contact lenses do not make things look as sharp as they once did.  You have trouble reading or seeing details at a distance with either eye.  You notice a sudden change in your vision or notice that parts of your field of vision appear missing or hazy.  You suddenly see moving specks or dark spots in the field of vision of either eye.  You have sudden partial loss of vision in either eye. Document Released: 03/02/2000 Document Revised: 12/24/2012 Document Reviewed: 08/25/2012 [facility] Patient Information 2015 [place], [place]. This information is not intended to replace advice given to you by your health care  provider. Make sure you discuss any questions you have with your health care provider.  Diabetes and Standards of Medical Care Diabetes is complicated. You may find that your diabetes team includes a dietitian, nurse, diabetes educator, eye doctor, and more. To help everyone know what is going on and to help you get the care you deserve, the following schedule of care was developed to help keep you on track. Below are the tests, exams, vaccines, medicines, education, and plans you will need. HbA1c test This test shows how well you have controlled your glucose over the past 2-3 months. It is used to see if your diabetes management plan needs to be adjusted.   It is performed at least 2 times a year if you are meeting treatment goals.  It is performed 4 times a year if therapy has changed or if you are not meeting treatment goals. Blood pressure test  This test is performed at every routine medical visit. The goal is less than 140/90 mm Hg for most people, but 130/80 mm Hg in some cases. Ask your health care provider about your goal. Dental exam  Follow up with the dentist regularly. Eye exam  If you are diagnosed with type 1 diabetes as a child, get an exam upon reaching the age of 2 years or older and have had diabetes for 3-5 years. Yearly eye exams are recommended after that  initial eye exam.  If you are diagnosed with type 1 diabetes as an adult, get an exam within 5 years of diagnosis and then yearly.  If you are diagnosed with type 2 diabetes, get an exam as soon as possible after the diagnosis and then yearly. Foot care exam  Visual foot exams are performed at every routine medical visit. The exams check for cuts, injuries, or other problems with the feet.  A comprehensive foot exam should be done yearly. This includes visual inspection as well as assessing foot pulses and testing for loss of sensation.  Check your feet nightly for cuts, injuries, or other problems with your feet.  Tell your health care provider if anything is not healing. Kidney function test (urine microalbumin)  This test is performed once a year.  Type 1 diabetes: The first test is performed 5 years after diagnosis.  Type 2 diabetes: The first test is performed at the time of diagnosis.  A serum creatinine and estimated glomerular filtration rate (eGFR) test is done once a year to assess the level of chronic kidney disease (CKD), if present. Lipid profile (cholesterol, HDL, LDL, triglycerides)  Performed every 5 years for most people.  The goal for LDL is less than 100 mg/dL. If you are at high risk, the goal is less than 70 mg/dL.  The goal for HDL is 40 mg/dL-50 mg/dL for men and 50 mg/dL-60 mg/dL for women. An HDL cholesterol of 60 mg/dL or higher gives some protection against heart disease.  The goal for triglycerides is less than 150 mg/dL. Influenza vaccine, pneumococcal vaccine, and hepatitis B vaccine  The influenza vaccine is recommended yearly.  It is recommended that people with diabetes who are over 53 years old get the pneumonia vaccine. In some cases, two separate shots may be given. Ask your health care provider if your pneumonia vaccination is up to date.  The hepatitis B vaccine is also recommended for adults with diabetes. Diabetes self-management education  Education is recommended at diagnosis and ongoing as needed. Treatment plan  Your treatment plan is reviewed at every medical visit. Document Released: 12/31/2008 Document Revised: 07/20/2013 Document Reviewed: 08/05/2012 [facility] Patient Information 2015 [place], [place]. This information is not intended to replace advice given to you by your health care provider. Make sure you discuss any questions you have with your health care provider.  Dehydration, Adult Dehydration is when you lose more fluids from the body than you take in. Vital organs like the kidneys, brain, and heart cannot function without a proper  amount of fluids and salt. Any loss of fluids from the body can cause dehydration.  CAUSES   Vomiting.  Diarrhea.  Excessive sweating.  Excessive urine output.  Fever. SYMPTOMS  Mild dehydration  Thirst.  Dry lips.  Slightly dry mouth. Moderate dehydration  Very dry mouth.  Sunken eyes.  Skin does not bounce back quickly when lightly pinched and released.  Dark urine and decreased urine production.  Decreased tear production.  Headache. Severe dehydration  Very dry mouth.  Extreme thirst.  Rapid, weak pulse (more than 100 beats per minute at rest).  Cold hands and feet.  Not able to sweat in spite of heat and temperature.  Rapid breathing.  Blue lips.  Confusion and lethargy.  Difficulty being awakened.  Minimal urine production.  No tears. DIAGNOSIS  Your caregiver will diagnose dehydration based on your symptoms and your exam. Blood and urine tests will help confirm the diagnosis. The diagnostic evaluation should also  identify the cause of dehydration. TREATMENT  Treatment of mild or moderate dehydration can often be done at home by increasing the amount of fluids that you drink. It is best to drink small amounts of fluid more often. Drinking too much at one time can make vomiting worse. Refer to the home care instructions below. Severe dehydration needs to be treated at the hospital where you will probably be given intravenous (IV) fluids that contain water and electrolytes. HOME CARE INSTRUCTIONS   Ask your caregiver about specific rehydration instructions.  Drink enough fluids to keep your urine clear or pale yellow.  Drink small amounts frequently if you have nausea and vomiting.  Eat as you normally do.  Avoid:  Foods or drinks high in sugar.  Carbonated drinks.  Juice.  Extremely hot or cold fluids.  Drinks with caffeine.  Fatty, greasy foods.  Alcohol.  Tobacco.  Overeating.  Gelatin desserts.  Wash your hands well  to avoid spreading bacteria and viruses.  Only take over-the-counter or prescription medicines for pain, discomfort, or fever as directed by your caregiver.  Ask your caregiver if you should continue all prescribed and over-the-counter medicines.  Keep all follow-up appointments with your caregiver. SEEK MEDICAL CARE IF:  You have abdominal pain and it increases or stays in one area (localizes).  You have a rash, stiff neck, or severe headache.  You are irritable, sleepy, or difficult to awaken.  You are weak, dizzy, or extremely thirsty. SEEK IMMEDIATE MEDICAL CARE IF:   You are unable to keep fluids down or you get worse despite treatment.  You have frequent episodes of vomiting or diarrhea.  You have blood or green matter (bile) in your vomit.  You have blood in your stool or your stool looks black and tarry.  You have not urinated in 6 to 8 hours, or you have only urinated a small amount of very dark urine.  You have a fever.  You faint. MAKE SURE YOU:   Understand these instructions.  Will watch your condition.  Will get help right away if you are not doing well or get worse. Document Released: 03/05/2005 Document Revised: 05/28/2011 Document Reviewed: 10/23/2010 [facility] Patient Information 2015 [place], [place]. This information is not intended to replace advice given to you by your health care provider. Make sure you discuss any questions you have with your health care provider.  Acute Kidney Injury Acute kidney injury is a disease in which there is sudden (acute) damage to the kidneys. The kidneys are 2 organs that lie on either side of the spine between the middle of the back and the front of the abdomen. The kidneys:  Remove wastes and extra water from the blood.   Produce important hormones. These help keep bones strong, regulate blood pressure, and help create red blood cells.   Balance the fluids and chemicals in the blood and tissues. A small amount of  kidney damage may not cause problems, but a large amount of damage may make it difficult or impossible for the kidneys to work the way they should. Acute kidney injury may develop into long-lasting (chronic) kidney disease. It may also develop into a life-threatening disease called end-stage kidney disease. Acute kidney injury can get worse very quickly, so it should be treated right away. Early treatment may prevent other kidney diseases from developing.  CAUSES   A problem with blood flow to the kidneys. This may be caused by:   Blood loss.   Heart disease.  Severe burns.   Liver disease.  Direct damage to the kidneys. This may be caused by:  Some medicines.   A kidney infection.   Poisoning or consuming toxic substances.   A surgical wound.   A blow to the kidney area.   A problem with urine flow. This may be caused by:   Cancer.   Kidney stones.   An enlarged prostate. SYMPTOMS   Swelling (edema) of the legs, ankles, or feet.   Tiredness (lethargy).   Nausea or vomiting.   Confusion.   Problems with urination, such as:   Painful or burning feeling during urination.   Decreased urine production.   Frequent accidents in children who are potty trained.   Bloody urine.   Muscle twitches and cramps.   Shortness of breath.   Seizures.   Chest pain or pressure. Sometimes, no symptoms are present. DIAGNOSIS Acute kidney injury may be detected and diagnosed by tests, including blood, urine, imaging, or kidney biopsy tests.  TREATMENT Treatment of acute kidney injury varies depending on the cause and severity of the kidney damage. In mild cases, no treatment may be needed. The kidneys may heal on their own. If acute kidney injury is more severe, your caregiver will treat the cause of the kidney damage, help the kidneys heal, and prevent complications from occurring. Severe cases may require a procedure to remove toxic wastes from the  body (dialysis) or surgery to repair kidney damage. Surgery may involve:   Repair of a torn kidney.   Removal of an obstruction. Most of the time, you will need to stay overnight at the hospital.  HOME CARE INSTRUCTIONS:  Follow your prescribed diet.  Only take over-the-counter or prescription medicines as directed by your caregiver.  Do not take any new medicines (prescription, over-the-counter, or nutritional supplements) unless approved by your caregiver. Many medicines can worsen your kidney damage or need to have the dose adjusted.   Keep all follow-up appointments as directed by your caregiver.  Observe your condition to make sure you are healing as expected. SEEK IMMEDIATE MEDICAL CARE IF:  You are feeling ill or have severe pain in the back or side.   Your symptoms return or you have new symptoms.  You have any symptoms of end-stage kidney disease. These include:   Persistent itchiness.   Loss of appetite.   Headaches.   Abnormally dark or light skin.  Numbness in the hands or feet.   Easy bruising.   Frequent hiccups.   Menstruation stops.   You have a fever.  You have increased urine production.  You have pain or bleeding when urinating. MAKE SURE YOU:   Understand these instructions.  Will watch your condition.  Will get help right away if you are not doing well or get worse Document Released: 09/18/2010 Document Revised: 06/30/2012 Document Reviewed: 11/02/2011 [facility] Patient Information 2015 [place], [place]. This information is not intended to replace advice given to you by your health care provider. Make sure you discuss any questions you have with your health care provider.  Blood Glucose Monitoring Monitoring your blood glucose (also know as blood sugar) helps you to manage your diabetes. It also helps you and your health care provider monitor your diabetes and determine how well your treatment plan is working. WHY SHOULD YOU  MONITOR YOUR BLOOD GLUCOSE?  It can help you understand how food, exercise, and medicine affect your blood glucose.  It allows you to know what your blood glucose is at any  given moment. You can quickly tell if you are having low blood glucose (hypoglycemia) or high blood glucose (hyperglycemia).  It can help you and your health care provider know how to adjust your medicines.  It can help you understand how to manage an illness or adjust medicine for exercise. WHEN SHOULD YOU TEST? Your health care provider will help you decide how often you should check your blood glucose. This may depend on the type of diabetes you have, your diabetes control, or the types of medicines you are taking. Be sure to write down all of your blood glucose readings so that this information can be reviewed with your health care provider. See below for examples of testing times that your health care provider may suggest. Type 1 Diabetes  Test 4 times a day if you are in good control, using an insulin pump, or perform multiple daily injections.  If your diabetes is not well controlled or if you are sick, you may need to monitor more often.  It is a good idea to also monitor:  Before and after exercise.  Between meals and 2 hours after a meal.  Occasionally between 2:00 a.m. and 3:00 a.m. Type 2 Diabetes  It can vary with each person, but generally, if you are on insulin, test 4 times a day.  If you take medicines by mouth (orally), test 2 times a day.  If you are on a controlled diet, test once a day.  If your diabetes is not well controlled or if you are sick, you may need to monitor more often. HOW TO MONITOR YOUR BLOOD GLUCOSE Supplies Needed  Blood glucose meter.  Test strips for your meter. Each meter has its own strips. You must use the strips that go with your own meter.  A pricking needle (lancet).  A device that holds the lancet (lancing device).  A journal or log book to write down your  results. Procedure  Wash your hands with soap and water. Alcohol is not preferred.  Prick the side of your finger (not the tip) with the lancet.  Gently milk the finger until a small drop of blood appears.  Follow the instructions that come with your meter for inserting the test strip, applying blood to the strip, and using your blood glucose meter. Other Areas to Get Blood for Testing Some meters allow you to use other areas of your body (other than your finger) to test your blood. These areas are called alternative sites. The most common alternative sites are:  The forearm.  The thigh.  The back area of the lower leg.  The palm of the hand. The blood flow in these areas is slower. Therefore, the blood glucose values you get may be delayed, and the numbers are different from what you would get from your fingers. Do not use alternative sites if you think you are having hypoglycemia. Your reading will not be accurate. Always use a finger if you are having hypoglycemia. Also, if you cannot feel your lows (hypoglycemia unawareness), always use your fingers for your blood glucose checks. ADDITIONAL TIPS FOR GLUCOSE MONITORING  Do not reuse lancets.  Always carry your supplies with you.  All blood glucose meters have a 24-hour "hotline" number to call if you have questions or need help.  Adjust (calibrate) your blood glucose meter with a control solution after finishing a few boxes of strips. BLOOD GLUCOSE RECORD KEEPING It is a good idea to keep a daily record or log of  your blood glucose readings. Most glucose meters, if not all, keep your glucose records stored in the meter. Some meters come with the ability to download your records to your home computer. Keeping a record of your blood glucose readings is especially helpful if you are wanting to look for patterns. Make notes to go along with the blood glucose readings because you might forget what happened at that exact time. Keeping  good records helps you and your health care provider to work together to achieve good diabetes management.  Document Released: 03/08/2003 Document Revised: 07/20/2013 Document Reviewed: 07/28/2012 [facility] Patient Information 2015 [place], [place]. This information is not intended to replace advice given to you by your health care provider. Make sure you discuss any questions you have with your health care provider.  Polysubstance Abuse When people abuse more than one drug or type of drug it is called polysubstance or polydrug abuse. For example, many smokers also drink alcohol. This is one form of polydrug abuse. Polydrug abuse also refers to the use of a drug to counteract an unpleasant effect produced by another drug. It may also be used to help with withdrawal from another drug. People who take stimulants may become agitated. Sometimes this agitation is countered with a tranquilizer. This helps protect against the unpleasant side effects. Polydrug abuse also refers to the use of different drugs at the same time.  Anytime drug use is interfering with normal living activities, it has become abuse. This includes problems with family and friends. Psychological dependence has developed when your mind tells you that the drug is needed. This is usually followed by physical dependence which has developed when continuing increases of drug are required to get the same feeling or "high". This is known as addiction or chemical dependency. A person's risk is much higher if there is a history of chemical dependency in the family. SIGNS OF CHEMICAL DEPENDENCY  You have been told by friends or family that drugs have become a problem.  You fight when using drugs.  You are having blackouts (not remembering what you do while using).  You feel sick from using drugs but continue using.  You lie about use or amounts of drugs (chemicals) used.  You need chemicals to get you going.  You are suffering in work  performance or in school because of drug use.  You get sick from use of drugs but continue to use anyway.  You need drugs to relate to people or feel comfortable in social situations.  You use drugs to forget problems. "Yes" answered to any of the above signs of chemical dependency indicates there are problems. The longer the use of drugs continues, the greater the problems will become. If there is a family history of drug or alcohol use, it is best not to experiment with these drugs. Continual use leads to tolerance. After tolerance develops more of the drug is needed to get the same feeling. This is followed by addiction. With addiction, drugs become the most important part of life. It becomes more important to take drugs than participate in the other usual activities of life. This includes relating to friends and family. Addiction is followed by dependency. Dependency is a condition where drugs are now needed not just to get high, but to feel normal. Addiction cannot be cured but it can be stopped. This often requires outside help and the care of professionals. Treatment centers are listed in the yellow pages under: Cocaine, Narcotics, and Alcoholics Anonymous. Most hospitals  and clinics can refer you to a specialized care center. Talk to your caregiver if you need help. Document Released: 10/25/2004 Document Revised: 05/28/2011 Document Reviewed: 03/05/2005 [facility] Patient Information 2015 [place], [place]. This information is not intended to replace advice given to you by your health care provider. Make sure you discuss any questions you have with your health care provider. Behavioral Health Resources in the [facility] [place]  Intensive Outpatient Programs: [facility]      [place]. [place], [place] Both a day and evening program       [facility] Outpatient     [address]        [place], [place]  [address] [phone]         ADS: Alcohol & Drug Svcs [place]: [phone] or [phone] [address] Chandra Feger [place], [place] [address] [email or web address]  Mobile Crisis Teams:                                        [facility]         Mobile Crisis Care Unit [phone]             [facility] [place] [address] Lady Gary Vanderbilt [address] [place] [phone]  Self-Help/Support Groups: [facility] of [employer] of support groups [phone] (call for more info)  Narcotics Anonymous (NA) [facility] [address] [place] - 2 meetings at this location  Residential Treatment Programs:  [place]       [place] [address] [place], [place]  [address] [place]  [address] [place], [place] [address] [phone] Admissions: 8am-3pm M-F  Incentives Substance [place]     [address] [place], [place] [address]       [phone]         [facility] [address] Jadene Pierini [place], [place]  [facility] [address] [place], [place]  [facility] - Intensive Outpatient      [address]     [place], [place]         [facility] ([place]     [place], [place] or [phone]  Residential Treatment Services (RTS)  [place], [place]  [facility] [address]                                               [place]  [facility]  Resources: [phone]  General Therapy                                                Domenic Schwab, PhD        [address] [place], [place] [address]         [facility]   [address] [place], [place] [address] [phone]  [facility] [address] [place], [place] [address] [phone] Insurance/Medicaid/sponsorship through [facility] and Families                                              [address] [place]                                        [place], [place] [address]    Therapy/tele-psych/case         [place] [address], [place]  [address]  Adolescent/group home/case management [phone]                                           Rosette Reveal PhD       General therapy       Insurance   [phone]         Dr. Adele Schilder Insurance 336- [place] [facility]/Residential Medicaid, sponsorship [id number]   Behavioral Health Resources in the Community: Intensive Outpatient Programs Organization         Address  Phone  Notes  [place]y [place]. [address] [place], [place] [phone]   [facility] Outpatient [address], [place], [place]   ADS: Alcohol & Drug Svcs [address] [place], [place]   [place] [address]  [place], [place] or [phone]   Substance Abuse Resources Organization         Address  Phone  Notes  Alcohol and Drug Services  [phone]   [place]  [phone]   [facility] [place]   Chinita Pester  [phone]   Residential & Outpatient Substance Abuse Program  [phone]   Psychological Services Organization         Address  Phone  Notes  [employer] [place]  [place]  [phone]   [place] [address] [place] [phone] or [phone]    Mobile Crisis Teams Organization         Address  Phone  Notes  [facility], Mobile Crisis Care Unit  [phone]   [facility]  [address] [place], [place]   [facility] [address] [place] [phone]  Self-Help/Support Groups Organization         Address  Phone             Notes  [facility] of [place] - variety of support groups  [place] Call for more information  Narcotics Anonymous (NA), [facility] [address], [employer] [place]  2 meetings at this location   Special educational needs teacher         Address  Phone  Notes  [facility] [place],    [place]  [phone]   [facility]  [address], [place] [address], [place], [place]   [place], [place] [phone] Admissions: 8am-3pm M-F  Incentives Substance [place] [address]    [place], [place] [phone]   [facility] [address] [place], [place], [place]   [facility] [address]  [place], [place]   [facility] - Intensive Outpatient [address], [place], [place]   [facility] ([employer] [place].,  [place], [place] [phone] or [phone]   Residential Treatment Services (RTS) [address] [place], [place] Accepts Medicaid  [facility] [place] [address],  [place] [phone] Substance Abuse/Addiction Treatment   [facility] Organization         Address  Phone  Notes  [facility]  [phone]   Domenic Schwab, PhD [address] Arlis Porta [place], [place]   [phone] or [phone]   [place], [place] [phone]   [facility] [address], [place], [place] [phone] Insurance/Medicaid/sponsorship through [facility] and Families [address]                                    [place], [place] [phone] [place] [address] [place] [address], [place] [phone]    Dr. Adele Schilder  [phone]   [facility] [place] Dept. 1) [address], [place] 2) [place] 3)  [place] [address], [place] [phone]  [phone]   [place] [phone] or [phone] (After Hours)

## 2013-12-10 NOTE — Progress Notes (Signed)
Establish Care Diabetic check Medicine refill

## 2013-12-10 NOTE — Assessment & Plan Note (Signed)
A: ongoing use

## 2013-12-10 NOTE — ED Notes (Signed)
Attempted several of attempts to drawn venous blood gas, unsuccessful. Notified PA.

## 2013-12-10 NOTE — ED Provider Notes (Signed)
CSN: [id number]     Arrival date & time 12/10/13  1047 History   First MD Initiated Contact with Patient 12/10/13 1141     Chief Complaint  Patient presents with  . Hyperglycemia     (Consider location/radiation/quality/duration/timing/severity/associated sxs/prior Treatment) HPI Comments: SHAKIM FAITH is a 33 y.o. male with a PMHx of anxiety, insulin-dependent DM2 with recent admission for DKA, GERD, and polysubstance abuse with multiple abscesses requiring I&D in 2014, who presents to the ED from [facility] clinic for hyperglycemia. Prior to arrival, he was at his PCP appt, and had a CBG >600 with +ketonuria (80), given 20U insulin and sent here. Pt states he was recently discharged from hospital for DKA, and has been using his Novolin 70/30 as directed, although he ran out 2 days prior and went to the pharmacy for a refill and was told he was 8 days early, which indicated he may have been using more than prescribed. States he uses Novolog sliding scale, but isn't using that consistently. Reports his CBGs at home have been in the 150s for the most part, but has had one low at 37, as well as some high readings of >600. Endorses that he has had "a lot going on" and didn't sleep well last night, and that he ran out of his xanax which has exacerbated his sleeping issues. 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 Denies any new draining wounds in arms. Endorses some blurred vision with elevated CBG, which resolves when sugar comes down. Has been drinking [employer] sweet tea today prior to arrival due to increased thirst. Denies fevers, chills, CP, SOB, cough, URI symptoms, abd pain, n/v/d/c, dysuria, hematuria, increased appetite, polyuria, lightheadedness, dizziness, weakness, or syncope. Denies LE paresthesias. Smokes 1ppd.   Patient is a 33 y.o. male presenting with hyperglycemia. The history is provided by the patient. No language interpreter was used.   Hyperglycemia Blood sugar level PTA:  >600 Severity:  Severe Onset quality:  Unable to specify Timing:  Unable to specify Progression:  Unchanged Chronicity:  Recurrent Diabetes status:  Controlled with insulin Current diabetic therapy:  Novolin 70/30 55U qAM and 45U qPM, with Novolog sliding scale 4-16U TID w/meals Time since last antidiabetic medication: 20U PTA at [facility] office. Context: noncompliance   Relieved by:  Nothing Ineffective treatments:  None tried Associated symptoms: blurred vision and increased thirst   Associated symptoms: no abdominal pain, no altered mental status, no chest pain, no confusion, no dehydration, no diaphoresis, no dizziness, no dysuria, no fatigue, no fever, no increased appetite, no malaise, no nausea, no polyuria, no shortness of breath, no syncope, no vomiting and no weakness   Risk factors: hx of DKA     Past Medical History  Diagnosis Date  . Pneumonia   . Headache(784.0)   . Anxiety  Dx 2008  . Diabetes mellitus without complication Dx 1324  . GERD (gastroesophageal reflux disease) Dx 2008  . Anemia   . Seizures 2011     x 2 in lifetime. on Dilantin for a while.   . 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 Irrigation and debridement abscess      Hx: of left arm abscess related to drug use   . I&d extremity Left 10/11/2012    Procedure: IRRIGATION AND DEBRIDEMENT ABSCESS FOREARM;  Surgeon: Linna Hoff, MD;  Location: [place];  Service: Orthopedics;  Laterality: Left;  . I&d extremity Left 10/12/2012    Procedure: IRRIGATION AND DEBRIDEMENT FOREARM;  Surgeon: Linna Hoff, MD;  Location: [place];  Service: Orthopedics;  Laterality: Left;  . I&d extremity Left 10/14/2012    Procedure: incision and drainage left forearm;  Surgeon: Linna Hoff, MD;  Location: [place];  Service: Orthopedics;  Laterality: Left;  . I&d extremity Left 10/16/2012    Procedure: IRRIGATION AND  DEBRIDEMENT LEFT FOREARM;  Surgeon: Linna Hoff, MD;  Location: [place];  Service: Orthopedics;  Laterality: Left;  . I&d extremity Left 10/20/2012    Procedure: INCISION AND DRAINAGE AND DEBRIDEMENT LEFT  FOREARM;  Surgeon: Linna Hoff, MD;  Location: [place];  Service: Orthopedics;  Laterality: Left;   Family History  Problem Relation Age of Onset  . Diabetes Father   . Heart disease Father   . Mental illness Sister   . Cancer Neg Hx    History  Substance Use Topics  . Smoking status: Current Every Day Smoker -- 1.00 packs/day    Types: Cigarettes  . Smokeless tobacco: Never Used  . Alcohol Use: Yes     Comment: occasional    Review of Systems  Constitutional: Negative for fever, chills, diaphoresis and fatigue.  HENT: Negative for congestion, rhinorrhea and sore throat.   Eyes: Positive for blurred vision.  Respiratory: Negative for cough and shortness of breath.   Cardiovascular: Negative for chest pain and syncope.  Gastrointestinal: Negative for nausea, vomiting, abdominal pain, diarrhea and constipation.  Endocrine: Positive for polydipsia. Negative for polyphagia and polyuria.  Genitourinary: Negative for dysuria, urgency, frequency, hematuria and flank pain.  Musculoskeletal: Negative for arthralgias and myalgias.  Skin: Negative for color change.       Denies new skin infections or draining wounds  Neurological: Negative for dizziness, syncope, weakness, light-headedness and headaches.  Psychiatric/Behavioral: Negative for confusion.   10 Systems reviewed and are negative for acute change except as noted in the HPI.    Allergies  Review of patient's allergies indicates no known allergies.  Home Medications   Prior to Admission medications   Medication Sig Start Date End Date Taking? Authorizing Provider  Blood Glucose Monitoring Suppl (BLOOD GLUCOSE METER KIT AND SUPPLIES) Dispense based on patient and insurance preference. Use up to four times daily as  directed. (FOR ICD-9 250.00, 250.01). 11/25/13   Donita Brooks, NP  esomeprazole (NEXIUM) 40 MG capsule Take 40 mg by mouth daily before breakfast.    Historical Provider, MD  insulin aspart (NOVOLOG) 100 UNIT/ML injection Inject 4 Units into the skin 3 (three) times daily with meals as needed for high blood sugar. For blood glucose greater than 200 TID 11/25/13   Donita Brooks, NP  insulin aspart protamine- aspart (NOVOLOG MIX 70/30) (70-30) 100 UNIT/ML injection Inject 0.5 mLs (50 Units total) into the skin 2 (two) times daily with a meal. 12/10/13   Josalyn C Funches, MD  Insulin Pen Needle (PEN NEEDLES 3/16") 31G X 5 MM MISC 1 each by Does not apply route 3 (three) times daily as needed. 11/25/13   Donita Brooks, NP  Insulin Syringes, Disposable, U-100 0.5 ML MISC 1 each by Does not apply route 3 (three) times daily as needed. 11/25/13   Donita Brooks, NP   BP 122/67  Pulse 95  Temp(Src) 98.7 F (37.1 C) (Oral)  Resp 10  SpO2 97% Physical Exam  Nursing note and vitals reviewed. Constitutional: He is  oriented to person, place, and time. He appears well-developed and well-nourished. He is easily aroused.  Non-toxic appearance. No distress.  Intermittently tachycardic to low 100s, but resolves during exam. Afebrile, nontoxic, sleepy but arousable. Drinking [employer] sweet tea.  HENT:  Head: Normocephalic and atraumatic.  Nose: Nose normal.  Mouth/Throat: Oropharynx is clear and moist. Mucous membranes are dry.  Mildly dry mucous membranes  Eyes: Conjunctivae and EOM are normal. Pupils are equal, round, and reactive to light. Right eye exhibits no discharge. Left eye exhibits no discharge.  Fundoscopic exam:      The right eye shows red reflex.       The left eye shows red reflex.  PERRL, EOMI, fundoscopic limited due to nondilated eye exam but +red reflex bilaterally  Neck: Normal range of motion. Neck supple.  Cardiovascular: Regular rhythm, normal heart sounds and intact distal pulses.   Tachycardia present.  Exam reveals no gallop and no friction rub.   No murmur heard. Intermittently tachycardic but resolving during exam, HR 90s-100s  Pulmonary/Chest: Effort normal and breath sounds normal. No respiratory distress. He has no decreased breath sounds. He has no wheezes. He has no rhonchi. He has no rales.  No kussmaul respirations or ketotic breath  Abdominal: Soft. Normal appearance and bowel sounds are normal. He exhibits no distension. There is no tenderness. There is no rigidity, no rebound, no guarding, no CVA tenderness, no tenderness at McBurney's point and negative Murphy's sign.  Soft, NTND, +BS throughout, no r/g/r  Musculoskeletal: Normal range of motion.  MAE x4  Neurological: He is alert, oriented to person, place, and time and easily aroused. He has normal strength. No sensory deficit.  Strength and sensation at baseline, A&O x4 although very somnolent during exam, easily arousable, states he's "tired"  Skin: Skin is warm and dry. Lesion noted. No rash noted. No erythema.  Multiple skin wounds to b/l forearms and antecubital areas, no warmth or draining areas, no erythema, but all areas seem mildly indurated which pt states is "chronic" and without any changes from baseline  Psychiatric: He has a normal mood and affect.    ED Course  Procedures (including critical care time) Labs Review Labs Reviewed  CBC WITH DIFFERENTIAL - Abnormal; Notable for the following:    WBC 12.9 (*)    Hemoglobin 12.0 (*)    HCT 36.2 (*)    Platelets 577 (*)    Neutro Abs 8.0 (*)    All other components within normal limits  URINE RAPID DRUG SCREEN (HOSP PERFORMED) - Abnormal; Notable for the following:    Opiates POSITIVE (*)    Cocaine POSITIVE (*)    All other components within normal limits  URINALYSIS, ROUTINE W REFLEX MICROSCOPIC - Abnormal; Notable for the following:    Glucose, UA >1000 (*)    Ketones, ur 40 (*)    All other components within normal limits  CBG  MONITORING, ED - Abnormal; Notable for the following:    Glucose-Capillary 479 (*)    All other components within normal limits  I-STAT CHEM 8, ED - Abnormal; Notable for the following:    Sodium 125 (*)    BUN 24 (*)    Creatinine, Ser 1.50 (*)    Glucose, Bld 486 (*)    Calcium, Ion 1.08 (*)    All other components within normal limits  I-STAT CG4 LACTIC ACID, ED - Abnormal; Notable for the following:    Lactic Acid, Venous 3.59 (*)    All other  components within normal limits  CBG MONITORING, ED - Abnormal; Notable for the following:    Glucose-Capillary 398 (*)    All other components within normal limits  CBG MONITORING, ED - Abnormal; Notable for the following:    Glucose-Capillary 268 (*)    All other components within normal limits  URINE MICROSCOPIC-ADD ON    Imaging Review No results found.   EKG Interpretation None      MDM   Final diagnoses:  Hyperglycemia due to type 2 diabetes mellitus  AKI (acute kidney injury)  Dehydration  Substance abuse    33y/o diabetic male with hyperglycemia, >600 at [facility], given 20U insulin and here CBG 479. Will check labs for DKA, give fluids and hold insulin for now given that pt received 20U prior to arrival. Will await on labs and reassess CBG after 1L and consider more insulin use vs drip and admission. Doubt arm wounds need I&D at this time given that pt states they're unchanged and chronic, and pt is afebrile at this time. Will reassess shortly.   1:30 PM Chem 8 showing Na 124 (calculated corrected Na 134, free water deficit 2 L), BUN 24, Cr 1.5 suggestive of AKI, bicarb WNL, Gluc 486. Lactic acid 3.59. UDS with +opiates, +cocaine, U/A with >1000 gluc and 40 ketones which is improved from clinic visit values this AM, CBC with diff appears hemoconcentrated. Will give one more bolus now. Will recheck CBG now and give 5U insulin if still high, and then recheck after all fluids completed. Labs do not appear that pt is in DKA/HHS,  will cancel VBG given that pt is a tough stick and at this point it does not appear that he's in DKA. HR improving with fluids. Will recheck pt shortly.  3:00 PM CBG 268 now after 2L and 5U novolog. Discussed proper insulin use, stopping IVDU, as well as diet changes to help prevent spikes in CBG. Pt feeling improved, VS improved from initial presentation, will not recheck chem 8 given that his dehydration was likely the source of AKI and lab changes, and do not feel he needs admission. Pt tolerating PO intake here. Will give resource guide for substance abuse issues. Will have them call [facility] now and schedule appt, and defer to them for insulin regimen/refills. Discussed seeing ophthalmologist for ongoing eye exams on a yearly basis. I explained the diagnosis and have given explicit precautions to return to the ER including for any other new or worsening symptoms. The patient understands and accepts the medical plan as it's been dictated and I have answered their questions. Discharge instructions concerning home care and prescriptions have been given. The patient is STABLE and is discharged to home in good condition.  BP 123/69  Pulse 90  Temp(Src) 98.7 F (37.1 C) (Oral)  Resp 14  SpO2 99%  Medications  sodium chloride 0.9 % bolus 1,000 mL (0 mLs Intravenous Stopped 12/10/13 1347)  sodium chloride 0.9 % bolus 1,000 mL (0 mLs Intravenous Stopped 12/10/13 1433)  insulin aspart (novoLOG) injection 5 Units (5 Units Intravenous Given 12/10/13 1354)     [place] Camprubi-Soms, PA-C 12/10/13 1510

## 2013-12-10 NOTE — Patient Instructions (Signed)
Pt being transferred to [facility] for + Ketones in urine and Glucose meter reading HIGH Pt given 20 units  Pt being transferred via POV Report given to charge nurse [place]

## 2013-12-11 LAB — MICROALBUMIN / CREATININE URINE RATIO
CREATININE, URINE: 31.6 mg/dL
MICROALB UR: 0.5 mg/dL (ref <2.0)
MICROALB/CREAT RATIO: 15.8 mg/g (ref 0.0–30.0)

## 2013-12-28 ENCOUNTER — Telehealth: Payer: Self-pay | Admitting: Family Medicine

## 2013-12-28 NOTE — Telephone Encounter (Signed)
Pt's wife called stating that the pt is going to have a tooth pulled and needs a letter from his PCP stating that his sugar is controled and he is being treated for his diabetes, the pt is having the procedure soon in order to go through with the procedure. Please f/u with the pt at [phone].

## 2014-01-05 NOTE — Telephone Encounter (Signed)
Please call patient.  Letter written and available for pick up.  Sugars are not well controlled based on my last evaluation, so this was not implied in the letter. I did write that patient is under my care.

## 2014-01-06 ENCOUNTER — Telehealth: Payer: Self-pay | Admitting: *Deleted

## 2014-01-06 NOTE — Telephone Encounter (Signed)
Left message to pick letter, if any question return call     Minerva Ends, MD at 01/05/2014  1:06 PM      Status: Signed            Please call patient.   Letter written and available for pick up.   Sugars are not well controlled based on my last evaluation, so this was not implied in the letter. I did write that patient is under my care.

## 2014-04-08 ENCOUNTER — Ambulatory Visit: Payer: Self-pay | Admitting: Surgery

## 2014-04-08 LAB — COMPREHENSIVE METABOLIC PANEL
ALK PHOS: 155 U/L — AB
ALT: 32 U/L
Albumin: 4 g/dL (ref 3.4–5.0)
Anion Gap: 10 (ref 7–16)
BUN: 28 mg/dL — ABNORMAL HIGH (ref 7–18)
Bilirubin,Total: 0.4 mg/dL (ref 0.2–1.0)
CHLORIDE: 107 mmol/L (ref 98–107)
CO2: 21 mmol/L (ref 21–32)
Calcium, Total: 9.1 mg/dL (ref 8.5–10.1)
Creatinine: 1.13 mg/dL (ref 0.60–1.30)
Glucose: 63 mg/dL — ABNORMAL LOW (ref 65–99)
OSMOLALITY: 279 (ref 275–301)
Potassium: 4.7 mmol/L (ref 3.5–5.1)
SGOT(AST): 34 U/L (ref 15–37)
Sodium: 138 mmol/L (ref 136–145)
Total Protein: 7.9 g/dL (ref 6.4–8.2)

## 2014-04-08 LAB — CBC WITH DIFFERENTIAL/PLATELET
BASOS ABS: 0.2 10*3/uL — AB (ref 0.0–0.1)
Basophil %: 1.2 %
EOS ABS: 0.4 10*3/uL (ref 0.0–0.7)
Eosinophil %: 2.8 %
HCT: 44.7 % (ref 40.0–52.0)
HGB: 14.8 g/dL (ref 13.0–18.0)
LYMPHS PCT: 34.9 %
Lymphocyte #: 5.2 10*3/uL — ABNORMAL HIGH (ref 1.0–3.6)
MCH: 28.2 pg (ref 26.0–34.0)
MCHC: 33.2 g/dL (ref 32.0–36.0)
MCV: 85 fL (ref 80–100)
Monocyte #: 1.3 x10 3/mm — ABNORMAL HIGH (ref 0.2–1.0)
Monocyte %: 8.3 %
NEUTROS ABS: 7.9 10*3/uL — AB (ref 1.4–6.5)
NEUTROS PCT: 52.8 %
Platelet: 502 10*3/uL — ABNORMAL HIGH (ref 150–440)
RBC: 5.26 10*6/uL (ref 4.40–5.90)
RDW: 14.9 % — AB (ref 11.5–14.5)
WBC: 15 10*3/uL — ABNORMAL HIGH (ref 3.8–10.6)

## 2014-04-11 ENCOUNTER — Observation Stay: Payer: Self-pay | Admitting: Internal Medicine

## 2014-04-11 LAB — COMPREHENSIVE METABOLIC PANEL
ALT: 79 U/L — AB
AST: 82 U/L — AB (ref 15–37)
Albumin: 3.9 g/dL (ref 3.4–5.0)
Alkaline Phosphatase: 225 U/L — ABNORMAL HIGH
Anion Gap: 8 (ref 7–16)
BILIRUBIN TOTAL: 0.4 mg/dL (ref 0.2–1.0)
BUN: 23 mg/dL — ABNORMAL HIGH (ref 7–18)
CHLORIDE: 94 mmol/L — AB (ref 98–107)
CO2: 26 mmol/L (ref 21–32)
Calcium, Total: 9.3 mg/dL (ref 8.5–10.1)
Creatinine: 1.5 mg/dL — ABNORMAL HIGH (ref 0.60–1.30)
EGFR (African American): >60
EGFR (Non-African Amer.): 57 — ABNORMAL LOW
Glucose: 694 mg/dL (ref 65–99)
Osmolality: 294 (ref 275–301)
Potassium: 4.7 mmol/L (ref 3.5–5.1)
Sodium: 128 mmol/L — ABNORMAL LOW (ref 136–145)
Total Protein: 7.4 g/dL (ref 6.4–8.2)

## 2014-04-11 LAB — URINALYSIS, COMPLETE
BLOOD: NEGATIVE
Bacteria: NONE SEEN
Bilirubin,UR: NEGATIVE
Glucose,UR: >=500 mg/dL (ref 0–75)
KETONE: NEGATIVE
Leukocyte Esterase: NEGATIVE
NITRITE: NEGATIVE
PROTEIN: NEGATIVE
Ph: 7 (ref 4.5–8.0)
RBC,UR: <1 /HPF (ref 0–5)
Specific Gravity: 1.026 (ref 1.003–1.030)
Squamous Epithelial: NONE SEEN
WBC UR: <1 /HPF (ref 0–5)

## 2014-04-11 LAB — LIPASE, BLOOD: LIPASE: 468 U/L — AB (ref 73–393)

## 2014-04-11 LAB — DRUG SCREEN, URINE
Amphetamines, Ur Screen: NEGATIVE (ref ?–1000)
Barbiturates, Ur Screen: NEGATIVE (ref ?–200)
Benzodiazepine, Ur Scrn: NEGATIVE (ref ?–200)
CANNABINOID 50 NG, UR ~~LOC~~: NEGATIVE (ref ?–50)
COCAINE METABOLITE, UR ~~LOC~~: NEGATIVE (ref ?–300)
MDMA (Ecstasy)Ur Screen: NEGATIVE (ref ?–500)
Methadone, Ur Screen: NEGATIVE (ref ?–300)
Opiate, Ur Screen: NEGATIVE (ref ?–300)
Phencyclidine (PCP) Ur S: NEGATIVE (ref ?–25)
TRICYCLIC, UR SCREEN: NEGATIVE (ref ?–1000)

## 2014-04-11 LAB — CBC
HCT: 43.5 % (ref 40.0–52.0)
HGB: 13.9 g/dL (ref 13.0–18.0)
MCH: 28.1 pg (ref 26.0–34.0)
MCHC: 32 g/dL (ref 32.0–36.0)
MCV: 88 fL (ref 80–100)
Platelet: 346 10*3/uL (ref 150–440)
RBC: 4.94 10*6/uL (ref 4.40–5.90)
RDW: 14.5 % (ref 11.5–14.5)
WBC: 13.5 10*3/uL — ABNORMAL HIGH (ref 3.8–10.6)

## 2014-04-11 LAB — LIPID PANEL
Cholesterol: 150 mg/dL (ref 0–200)
HDL: 46 mg/dL (ref 40–60)
Ldl Cholesterol, Calc: 80 mg/dL (ref 0–100)
Triglycerides: 121 mg/dL (ref 0–200)
VLDL Cholesterol, Calc: 24 mg/dL (ref 5–40)

## 2014-04-11 LAB — HEMOGLOBIN A1C: Hemoglobin A1C: 11.3 % — ABNORMAL HIGH (ref 4.2–6.3)

## 2014-04-12 LAB — HEPATIC FUNCTION PANEL A (ARMC)
Albumin: 3.4 g/dL (ref 3.4–5.0)
Alkaline Phosphatase: 138 U/L — ABNORMAL HIGH
BILIRUBIN TOTAL: 0.9 mg/dL (ref 0.2–1.0)
Bilirubin, Direct: <0.1 mg/dL (ref 0.0–0.2)
SGOT(AST): 47 U/L — ABNORMAL HIGH (ref 15–37)
SGPT (ALT): 66 U/L — ABNORMAL HIGH
Total Protein: 6.2 g/dL — ABNORMAL LOW (ref 6.4–8.2)

## 2014-04-12 LAB — RAPID HIV SCREEN (HIV 1/2 AB+AG)

## 2014-04-12 LAB — LIPASE, BLOOD: LIPASE: 247 U/L (ref 73–393)

## 2014-04-19 ENCOUNTER — Inpatient Hospital Stay: Payer: Self-pay | Admitting: Internal Medicine

## 2014-04-19 LAB — CBC
HCT: 37.9 % — AB (ref 40.0–52.0)
HGB: 12 g/dL — ABNORMAL LOW (ref 13.0–18.0)
MCH: 27.8 pg (ref 26.0–34.0)
MCHC: 31.8 g/dL — ABNORMAL LOW (ref 32.0–36.0)
MCV: 87 fL (ref 80–100)
PLATELETS: 383 10*3/uL (ref 150–440)
RBC: 4.33 10*6/uL — AB (ref 4.40–5.90)
RDW: 14.3 % (ref 11.5–14.5)
WBC: 13 10*3/uL — AB (ref 3.8–10.6)

## 2014-04-19 LAB — BASIC METABOLIC PANEL
Anion Gap: 7 (ref 7–16)
BUN: 27 mg/dL — ABNORMAL HIGH (ref 7–18)
CHLORIDE: 103 mmol/L (ref 98–107)
CREATININE: 1.33 mg/dL — AB (ref 0.60–1.30)
Calcium, Total: 8.6 mg/dL (ref 8.5–10.1)
Co2: 25 mmol/L (ref 21–32)
EGFR (African American): >60
EGFR (Non-African Amer.): >60
Glucose: 523 mg/dL (ref 65–99)
Osmolality: 299 (ref 275–301)
Potassium: 4.5 mmol/L (ref 3.5–5.1)
Sodium: 135 mmol/L — ABNORMAL LOW (ref 136–145)

## 2014-04-19 LAB — COMPREHENSIVE METABOLIC PANEL
ALT: 88 U/L — AB (ref 14–63)
ANION GAP: 11 (ref 7–16)
Albumin: 3.6 g/dL (ref 3.4–5.0)
Alkaline Phosphatase: 179 U/L — ABNORMAL HIGH (ref 46–116)
BUN: 27 mg/dL — AB (ref 7–18)
Bilirubin,Total: <0.1 mg/dL — ABNORMAL LOW (ref 0.2–1.0)
CHLORIDE: 95 mmol/L — AB (ref 98–107)
Calcium, Total: 8.8 mg/dL (ref 8.5–10.1)
Co2: 24 mmol/L (ref 21–32)
Creatinine: 1.39 mg/dL — ABNORMAL HIGH (ref 0.60–1.30)
EGFR (African American): >60
EGFR (Non-African Amer.): >60
Glucose: 799 mg/dL (ref 65–99)
Osmolality: 305 (ref 275–301)
Potassium: 5.3 mmol/L — ABNORMAL HIGH (ref 3.5–5.1)
SGOT(AST): 30 U/L (ref 15–37)
Sodium: 130 mmol/L — ABNORMAL LOW (ref 136–145)
TOTAL PROTEIN: 6.7 g/dL (ref 6.4–8.2)

## 2014-04-19 LAB — LIPASE, BLOOD: Lipase: 1500 U/L — ABNORMAL HIGH (ref 73–393)

## 2014-04-20 LAB — BASIC METABOLIC PANEL
Anion Gap: 12 (ref 7–16)
BUN: 18 mg/dL (ref 7–18)
CREATININE: 0.99 mg/dL (ref 0.60–1.30)
Calcium, Total: 9.2 mg/dL (ref 8.5–10.1)
Chloride: 104 mmol/L (ref 98–107)
Co2: 21 mmol/L (ref 21–32)
EGFR (African American): >60
GLUCOSE: 324 mg/dL — AB (ref 65–99)
Osmolality: 288 (ref 275–301)
POTASSIUM: 5.4 mmol/L — AB (ref 3.5–5.1)
Sodium: 137 mmol/L (ref 136–145)

## 2014-04-21 LAB — BASIC METABOLIC PANEL WITH GFR
Anion Gap: 13
BUN: 23 mg/dL — ABNORMAL HIGH
Calcium, Total: 9.2 mg/dL
Chloride: 108 mmol/L — ABNORMAL HIGH
Co2: 17 mmol/L — ABNORMAL LOW
Creatinine: 1.05 mg/dL
EGFR (African American): >60
EGFR (Non-African Amer.): >60
Glucose: 295 mg/dL — ABNORMAL HIGH
Osmolality: 290
Potassium: 6.1 mmol/L — ABNORMAL HIGH
Sodium: 138 mmol/L

## 2014-04-21 LAB — POTASSIUM: Potassium: 4.7 mmol/L

## 2014-07-07 IMAGING — US US RENAL
1 series · 14 of 25 positions shown · non-contrast
Comparison: No priors.

CLINICAL DATA: Acute renal insufficiency.

RENAL/URINARY TRACT ULTRASOUND COMPLETE

[Series 1: us renal · 0.33mm/px · 14 of 31 slices shown]
[im 1/31]
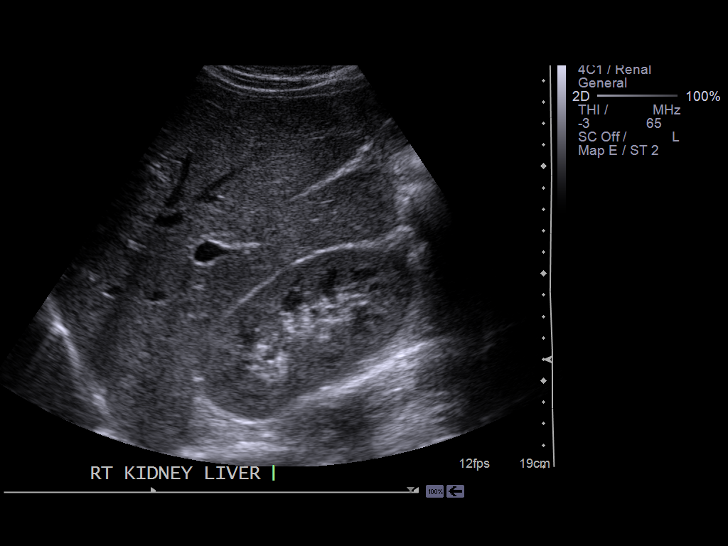
[im 3/31]
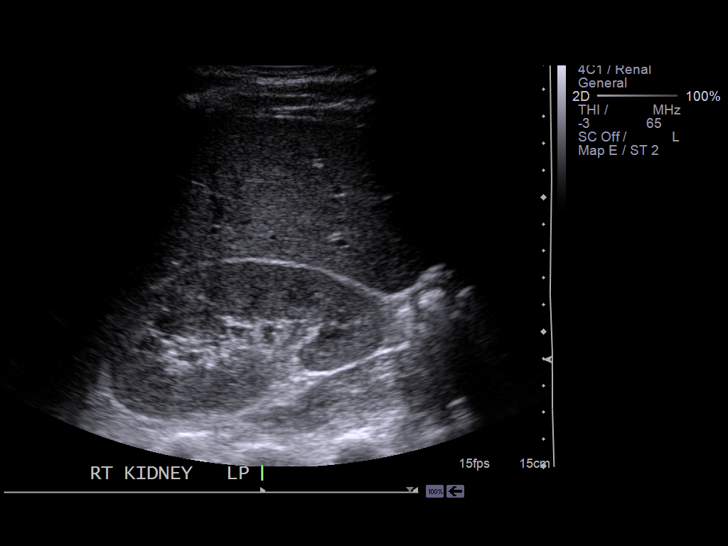
[im 6/31]
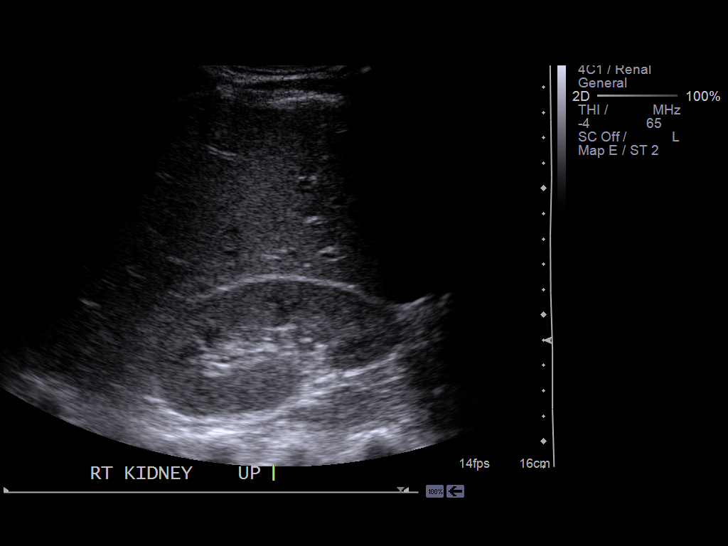
[im 8/31]
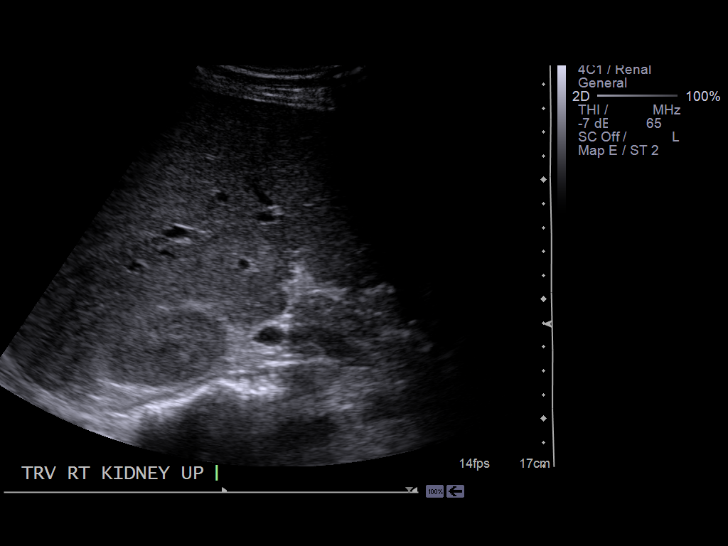
[im 11/31]
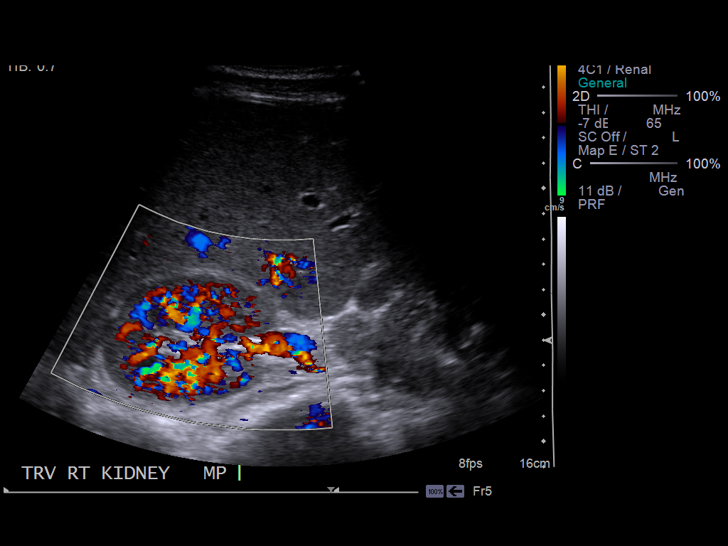
[im 12/31]
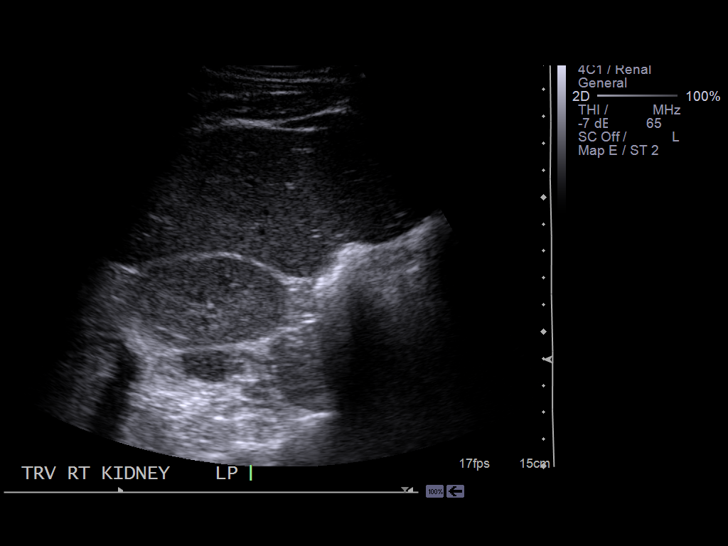
[im 14/31]
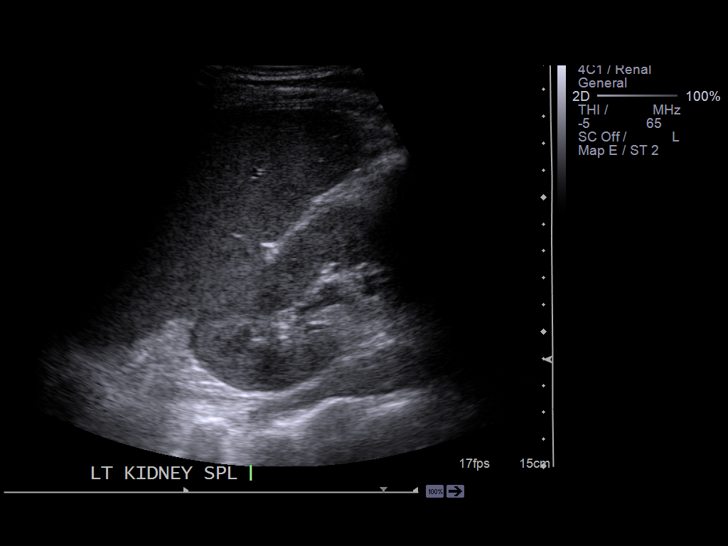
[im 17/31]
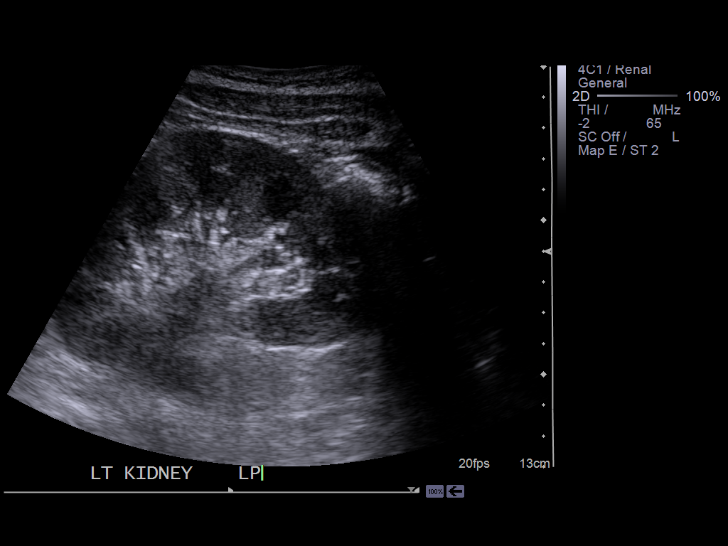
[im 19/31]
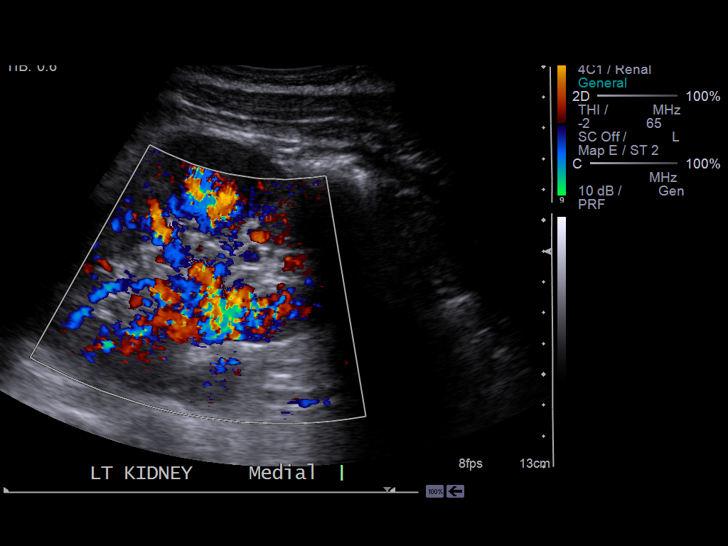
[im 21/31]
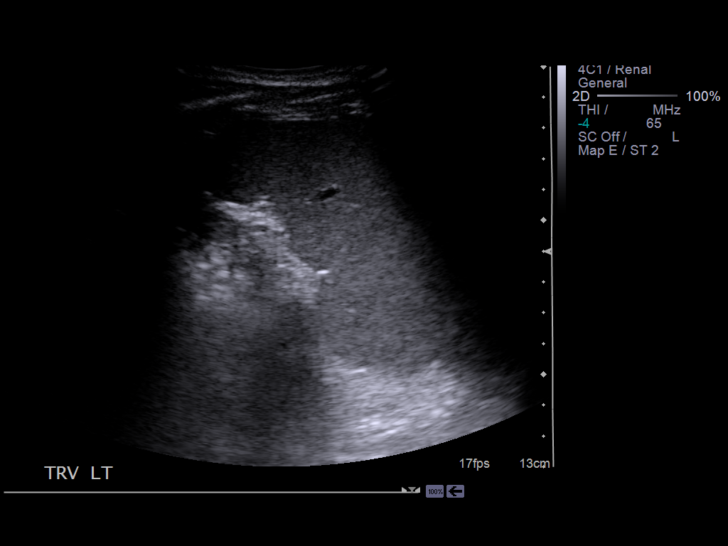
[im 23/31]
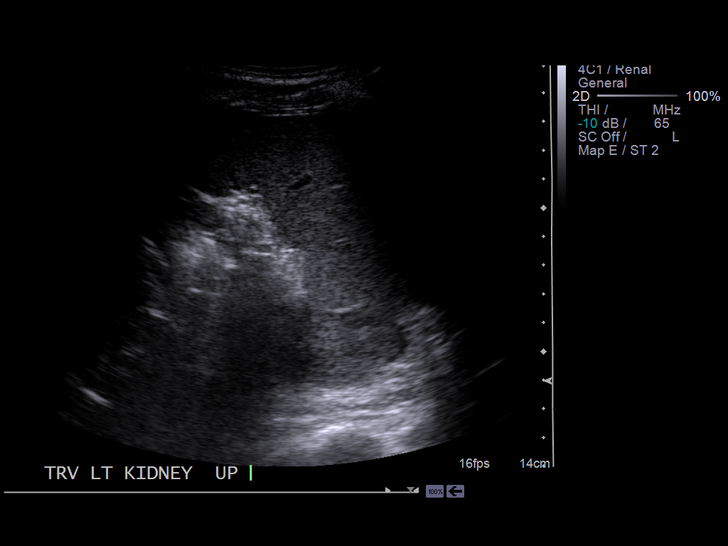
[im 26/31]
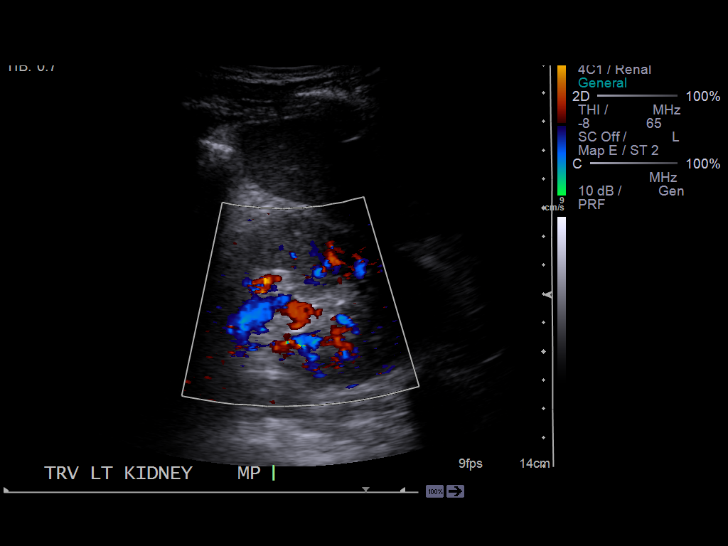
[im 28/31]
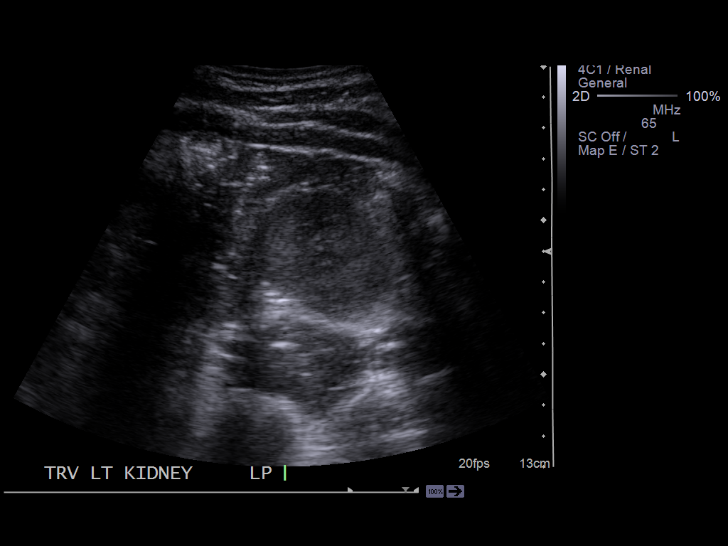
[im 31/31]
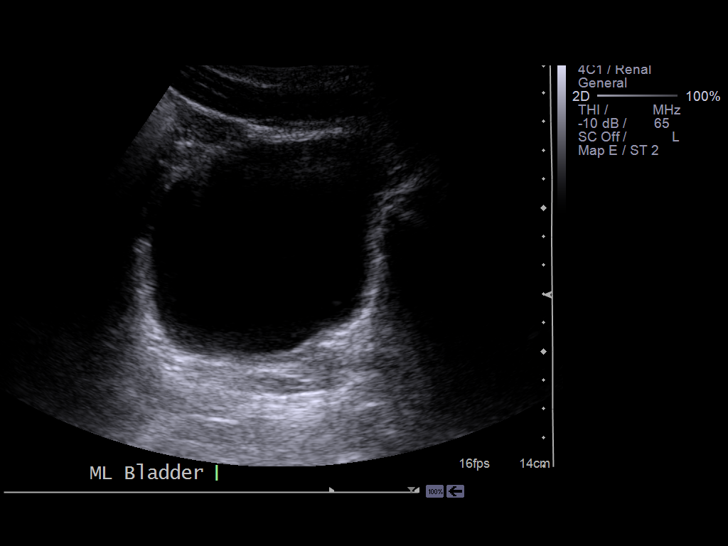

[14 of 25 positions shown; findings below may reference images not displayed]

FINDINGS: Right Kidney:  No hydronephrosis.  Well-preserved cortex.  Normal
size and parenchymal echotexture without focal abnormalities.
cm in length.

Left Kidney:  No hydronephrosis.  Well-preserved cortex.  Normal
size and parenchymal echotexture without focal abnormalities.
cm in length.

Bladder:  Well distended without focal neural abnormalities.
IMPRESSION: 1.  Normal sonographic appearance of the kidneys and urinary
bladder.

## 2014-07-18 NOTE — Consult Note (Signed)
Chief Complaint and History:  Referring Physician Dr. Darvin Neighbours   Chief Complaint Uncontrolled diabetes   Allergies:  No Known Allergies:   Assessment/Plan:  Assessment/Plan 34 yo M with h/o type 1 diabetes and recent bout of acute pancreatitis was admitted yesterday with severe hypoglycemia without acidosis was seen in consultation. History obtained, chart reviewed and patient was interviewed and examined. [facility] prison. MD at prison changed him insulin regimen 2 days prior to admission, stopping his 70/30 Mix insulin and then starting Lantus 25 units each morning and Regular insulin 10 units tid AC. Prior to this change, sugars had been consistetnly "high" according to the BG log provided from the prison.  A/ Severely uncontrolled type 1 diabetes  P/ -Continue Lantus and NovoLog.  -Agree with adjusting Lantus to 46 units qhs.  -Increase NovoLog to 10 units tid AC.  -Continue NovoLog SSI. -No role for metformin as he has type 1 diabetes.   Electronic Signatures: Judi Cong (MD)  (Signed 02-Feb-16 17:06)  Authored: Chief Complaint and History, ALLERGIES, HOME MEDICATIONS, Assessment/Plan   Last Updated: 02-Feb-16 17:06 by Judi Cong (MD)

## 2014-07-18 NOTE — Consult Note (Signed)
PATIENT NAME:  Luke [name], Luke [name] MR#:  [id number] DATE OF BIRTH:  08/21/1980  DATE OF CONSULTATION:  04/20/2014  REFERRING PHYSICIAN:  Srikar R. Sudini, MD  CONSULTING PHYSICIAN:  A. Lavone Orn, MD  CHIEF COMPLAINT: Uncontrolled diabetes.   HISTORY OF PRESENT ILLNESS: This is a 34 year old male seen in consultation for uncontrolled diabetes by request of Dr. Darvin Neighbours. The patient with a 10-year history of type 1 diabetes was admitted yesterday after presenting with increased thirst, polyuria, and hyperglycemia. The patient is incarcerated and was having blood sugars checked regularly at the prison. Over the last several days, he has had repeated recordings of blood sugar above the limit of detection or "high." The patient had been receiving 70/30/mix insulin 30 units twice daily plus a NovoLog insulin sliding scale of 2 units per 50 over a target of 200 up until 2 days prior to admission, when that was stopped and it was replaced with Lantus 25 units each morning, regular insulin 10 units t.i.d. a.c. and the same NovoLog insulin sliding scale. Despite that change, blood sugars remained elevated; and therefore, yesterday, the patient was sent to the ER. Initial blood sugar was 799. He was not acidotic or ketotic. He did have an elevated lipase level of greater than 1500. The patient was started on IV fluids, as well as subcutaneous insulin. Initially, was given Lantus 30 units plus several doses of NovoLog over the course of the day. Blood sugar improved and, in fact, he had a low blood sugar by 12:30 p.m. yesterday to 45. Over the last 24 hours, blood sugars have been in the 199-494 range. Diabetes remains severely uncontrolled. He has no acute complaints at this time. He denies nausea. Tolerating diet. He denies abdominal pain.   The patient reports a 10-year history of diabetes. Over that time, he has had repeated hospitalizations for either uncontrolled diabetes and/or diabetic ketoacidosis. He has no known  complications from diabetes; however, he has not had consistent medical care. He has been incarcerated for the last month and anticipates to be discharged in 3 months' time. He states he has never been seen by endocrinology. Prior to incarceration, he was taking a 70/30 mixed insulin twice daily at a dose of 46 units b.i.d. Hemoglobin A1c on 04/11/2014 was 11.3%. He has no known renal insufficiency. He denies any numbness, tingling, or pain in his feet. Reports blurred vision only when blood sugars are very high.   PAST MEDICAL HISTORY: 1. Type 1 diabetes mellitus.  2. History of acute pancreatitis.  3. Recurrent diabetic ketoacidosis.   ALLERGIES: No known drug allergies.   SOCIAL HISTORY: The patient is incarcerated as per HPI. He is married. He has children. He drinks alcohol and uses both heroin and cocaine. He smokes 1 pack per day of cigarettes. He was using  illicit drugs several times per week prior to incarceration. His wife is a Marine scientist and employed in [place].   FAMILY HISTORY: Father had type 2 diabetes managed with oral medications, as well as coronary disease.   REVIEW OF SYSTEMS:  HEENT: No blurred vision or sore throat.  NECK: No neck pain. No dysphagia.  GENERAL: No fever. No recent weight loss.  CARDIAC: No chest pain or palpitation.  PULMONARY: No cough. No shortness of breath.  ABDOMEN: Denies abdominal pain. Denies nausea.  EXTREMITIES: Denies lower extremity swelling.  SKIN: Denies rash or recent skin changes.  GENITOURINARY: Denies hematuria or dysuria.  HEMATOLOGIC: Denies easy bruisability or recent bleeding.   PHYSICAL  EXAMINATION: VITAL SIGNS: Height 68.9 inches, weight 180 pounds, temperature 98.3, pulse 109, respirations 18, BP 126/83.  GENERAL: Well-developed African American male in no acute distress.  HEENT: EOMI, oropharynx is clear. Mucous membranes moist.  NECK: Supple. No appreciable thyromegaly.  CARDIAC: Regular rate and rhythm. No murmur.   PULMONARY: Clear to auscultation bilaterally. No wheeze.  ABDOMEN: Diffusely soft, nontender, nondistended.  EXTREMITIES: No peripheral edema is present.  SKIN: No rash or dermatopathy present. A bilateral bare foot exam shows no calluses or ulcerations.  PSYCHIATRIC: Calm, cooperative, alert, and oriented.   LABORATORY DATA: Glucose 324, BUN 18, creatinine 0.99, sodium 137, potassium 5.4, chloride 104, bicarbonate 21, calcium 9.2.   ASSESSMENT: 1. Uncontrolled type 1 diabetes.  2. Cocaine and heroin use.   RECOMMENDATIONS: 1. Agree with current dose of Lantus as ordered at 46 units daily. Is best to give at bedtime and titrate dose to fasting blood sugar.  2. Increase NovoLog to 10 units t.i.d. a.c. Give before meals.  3. Continue current NovoLog sliding scale of 2 units per 50 mg/dL over a target of 150.  4. Should the patient, once he returns to the prison, require that he take regular insulin, that would be reasonable; however, it should be dosed about 30 minutes prior to the meal. Additionally, if he is taking regular insulin at meals, he could use a regular insulin sliding scale.  5. No role for metformin, as he has type 1 diabetes, by history.  6. Discussed blood sugar goals. Fasting goal blood sugar is 70-130 and a 2- hour postprandial goal would be 70-180. The patient would benefit from, at minimum, q. a.c. at bedtime blood sugar monitoring.  7. Discussed role of carbohydrate counting and adjusting insulin based on carb intake. The patient states he is aware of this and has been taught to do this in the past, however, has never been as compliant with carbohydrate counting. This should be something that he could institute after he is released from prison. Advised the patient to seek endocrinology consultation at that time. He did assure me he has a family practice clinic that he has been to in the past, and that is  where he gets his insulin. The patient denies any barriers to getting  his medications outside of the prison.   Thank you for the kind request for consultation. I will follow along with you.   ____________________________ A. Lavone Orn, MD ams:mw D: 04/20/2014 17:30:47 ET T: 04/20/2014 19:27:09 ET JOB#: [id number]  cc: A. Lavone Orn, MD, <Dictator> Sherlon Handing MD ELECTRONICALLY SIGNED 04/21/2014 13:12

## 2014-07-18 NOTE — H&P (Signed)
PATIENT NAME:  Luke [name], Luke [name] MR#:  [id number] DATE OF BIRTH:  1980-06-09  DATE OF ADMISSION:  04/11/2014  REFERRING PHYSICIAN: Loney Hering, M.D.   PRIMARY CARE PHYSICIAN: Nonlocal.   ADMISSION DIAGNOSIS: Hyperglycemia and pancreatitis.   HISTORY OF PRESENT ILLNESS: This is a 34 year old, African American male, who presents to the Emergency Department via ambulance due to hyperglycemia. The patient has been incarcerated in jail for the last 2 weeks and has been receiving his insulin from the infirmary. The patient states that today he missed his first dose of insulin, and later in the day, it was not taken, so he received his second insulin shot due to some misunderstanding by the nursing personnel. He later checked his sugar, which he found to be too high for the meter to measure, which prompted the jail staff to bring him to the hospital for evaluation.   In the Emergency Department, the patient was found to have a blood sugar more than 600, which prompted the Emergency Department to start hydrating him with intravenous fluid. The patient also underwent a CT scan once his lipase was found to be fairly elevated, which showed acute pancreatitis. These findings prompted the Emergency Department to call for admission.   REVIEW OF SYSTEMS:   CONSTITUTIONAL: The patient denies fever or weakness.  EYES: Denies inflammation or blurred vision.  ENT: Denies tinnitus or sore throat.  RESPIRATORY: Denies shortness of breath or cough.  CARDIOVASCULAR: Denies chest pain or palpitations, orthopnea, or paroxysmal nocturnal dyspnea.  GASTROINTESTINAL: Denies nausea, vomiting, diarrhea. The patient does admit to some abdominal pain.  GENITOURINARY: Denies dysuria, increased frequency, or hesitancy of urination.  ENDOCRINE: Admits to polydipsia, but denies polyuria. HEMATOLOGIC AND LYMPHATIC: Denies easy bruising or bleeding.  INTEGUMENTARY: Admits to rashes and lesions.  MUSCULOSKELETAL: Denies  arthralgias or myalgias.  NEUROLOGIC: Denies numbness in his extremities or dysarthria.  PSYCHIATRIC: Denies depression or suicidal ideation.   PAST MEDICAL HISTORY: Diabetes type 2, and heroin and cocaine abuse.   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   SOCIAL HISTORY: The patient is a heroin and cocaine abuser. He has smoked 1 pack a day for the last 20 years, and he is an occasional drinker. He is married.   FAMILY HISTORY: The patient's father had diabetes and coronary artery disease, which contributed to his death.   MEDICATIONS: None.   ALLERGIES: No known allergies.   PERTINENT LABORATORY RESULTS AND RADIOGRAPHIC FINDINGS: Serum glucose from basic metabolic panel is 63, BUN is 28, creatinine 1.13, serum sodium 138, potassium 4.7, chloride is 107, bicarbonate 21, calcium 9.1, serum albumin is 4, alkaline phosphatase 155, AST 34, ALT is 32. White blood cell count is 15, hemoglobin 14.8, hematocrit is 44.7, platelet count is 502,000. MCV is 85.   CT scan of the abdomen and pelvis without contrast shows subtly hazy inflammation and stranding about the pancreas suggesting acute pancreatitis. There is no definite peripancreatic fluid. There are no other acute intraabdominal or pelvic processes. There is a large amount of retained stool within the colon suggesting some constipation. There are punctate calcific densities within the right kidney suggestive of small possible nonobstructive calculi.   PHYSICAL EXAMINATION:  VITAL SIGNS: Temperature is 98, pulse 85, respirations 18, blood pressure 138/90, pulse oximetry is 99 on room air.  GENERAL: The patient is alert and oriented x 3, in no apparent distress.  HEENT: Normocephalic, atraumatic. Pupils equal, round,  and reactive to light and accommodation. Extraocular movements are intact. Mucous membranes are dry.  NECK: Trachea is midline. No adenopathy.  Thyroid is nonpalpable, nontender.  CHEST: Symmetric and atraumatic.  CARDIOVASCULAR: Regular rate and rhythm. Normal S1, S2. No rubs, clicks, or murmurs appreciated.  LUNGS: Clear to auscultation bilaterally. Normal effort and excursion.  ABDOMEN: Positive bowel sounds. Soft and nondistended, but it is tender with some voluntary guarding, but no rebound tenderness. There is no hepatosplenomegaly. The abdomen is tender more in the midepigastric region radiating to the right upper quadrant.  GENITOURINARY: Deferred.  MUSCULOSKELETAL: The patient moves all 4 extremities equally. There is 5/5 strength in upper and lower extremities bilaterally.  SKIN: Warm and dry. There are no rashes, but the patient has multiple lesions consistent with skin popping or vein extravasation of intravenous drugs.  NEUROLOGIC: Cranial nerves II through XII are grossly intact.  PSYCHIATRIC: Mood is normal. Affect is congruent. The patient has excellent insight and judgment into his medical conditions.   ASSESSMENT AND PLAN: This is a 34 year old male admitted for hyperglycemia and pancreatitis.   1. Hyperglycemia. The patient's blood sugar is back to normal, at this time. I have ordered sliding-scale insulin and we will add basal coverage after 24 hours once the patient has started eating again. 2. Pancreatitis is possibly secondary to hyperglycemia. I will also check a lipid panel. Our goal is pain management. The patient is n.p.o. and I started him on aggressive IV hydration.  3. Drug abuse. The patient has not used any illicit substances in 2 weeks due to incarceration. He denies any signs or symptoms of withdrawal, at this time. We will continue to monitor for any signs or symptoms of drug withdrawal.  4. Deep vein thrombosis prophylaxis with heparin.  5. Gastrointestinal prophylaxis, H2-blocker as needed.   CODE STATUS: The patient is a FULL CODE.   TIME SPENT ON ADMISSION ORDERS AND PATIENT CARE: Approximately 35  minutes.    ____________________________ Norva Riffle. Marcille Blanco, MD msd:JT D: 04/11/2014 07:22:04 ET T: 04/11/2014 08:45:20 ET JOB#: [id number]  cc: Norva Riffle. Marcille Blanco, MD, <Dictator> Norva Riffle Lexiana Spindel MD ELECTRONICALLY SIGNED 04/13/2014 2:58

## 2014-07-18 NOTE — Discharge Summary (Signed)
PATIENT NAME:  Luke, [name] MR#:  [id number] DATE OF BIRTH:  [birth year]  DATE OF ADMISSION:  04/11/2014 DATE OF DISCHARGE:  04/12/2014  PRIMARY CARE PHYSICIAN: The physician over at the jail.  FINAL DIAGNOSES:  1. Acute pancreatitis, likely drug-related.  2. Uncontrolled diabetes, type 2.  3. Drug abuse, cocaine and heroin injection.  4. Hyponatremia.  5. Renal insufficiency.  6. Elevated liver function tests.   MEDICATIONS ON DISCHARGE: Include insulin 70/30 at 30 units subcutaneous injection twice a day, sliding scale insulin as needed.   DIET: Carbohydrate-controlled, low-fat diet, regular consistency.   ACTIVITY: As tolerated.   FOLLOW-UP:  In 1 to 2 days with the doctor at the jail.   HOSPITAL COURSE: The patient was admitted, 04/11/2014, discharged, 04/12/2014. Came in with hyperglycemia and pancreatitis, had some abdominal pain. Sugars were up in the 600s. The patient was initially kept n.p.o., started on sliding scale insulin, given vigorous IV fluid hydration.   LABORATORY AND RADIOLOGICAL DATA DURING THE HOSPITAL COURSE: Included a glucose greater than 600, white blood cell count 13.5, hemoglobin and hematocrit 13.9 and 43.5, platelet count 346,000. Hemoglobin A1c 11.3. LDL 80, HDL 46, triglycerides 121. Glucose on chemistries 694, BUN 23, creatinine 1.50, sodium 128, potassium 4.7, chloride 94, CO2 of 26, calcium 9.3.   Liver function tests: Alkaline phosphatase 225, ALT 79, AST 82, total protein 7.4.   Urinalysis greater than 500 mg/dL of glucose.   CT scan of the abdomen and pelvis without contrast showed subtle hazy inflammatory stranding about the pancreas, a large amount of retained stool within the colon suggestive of constipation.   Urine toxicology was negative.   HIV test nonreactive, negative.   Repeat lipase came down to 247. Liver function tests trended better. Alkaline phosphatase 138, ALT 66, AST 47.   Ultrasound of the abdomen: No gallstones or wall  thickening visualized. No Murphy sign.   The patient felt better and tolerated a diet, and was discharged back to jail.   1. For his acute pancreatitis, likely drug-related using cocaine and heroin as outpatient, a couple of weeks back. The patient states that he does not use alcohol. Does not look like he is on any other medications that would cause it. His triglycerides are normal and gallbladder imaging was normal. The patient thinks that it is secondary to his uncontrolled diabetes. Either way, the patient's pain resolved, tolerated a diet, and was discharged back to jail.  2. Uncontrolled type 2 diabetes. I spoke with the patient about switching to either Levemir or Lantus insulin. The patient refused. Wants to go back to his 70/30 insulin. States that he also has low sugars, so I did put him back on his usual 70/30 insulin and sliding scale.  3. Drug abuse with cocaine and heroin. Advised against this. I sent off hepatitis profiles, which are still pending at the time of discharge. HIV test was negative. The patient does have track marks with various stages of healing up and down his arms. I advised against IV drug use.  4. Hyponatremia secondary to uncontrolled diabetes.  5. Renal insufficiency. The patient was given IV fluid hydration.  6. Elevated liver function tests did improve with IV fluid hydration.   TIME SPENT ON DISCHARGE: 35 minutes.    ____________________________ Tana Conch. Leslye Peer, MD rjw:JT D: 04/12/2014 15:44:38 ET T: 04/12/2014 16:15:06 ET JOB#: [id number]  cc: Tana Conch. Leslye Peer, MD, <Dictator> Marisue Brooklyn MD ELECTRONICALLY SIGNED 04/16/2014 14:42

## 2014-07-18 NOTE — Discharge Summary (Signed)
PATIENT NAME:  Luke [name], Luke [name] MR#:  [id number] DATE OF BIRTH:  1980/05/28  DATE OF ADMISSION:  04/19/2014 DATE OF DISCHARGE:  04/21/2014  ADMITTING PHYSICIAN: Juluis Mire, MD   DISCHARGING PHYSICIAN: Gladstone Lighter, MD  PRIMARY CARE PHYSICIAN: None.   CONSULTATIONS IN THE HOSPITAL: Endocrinology consultation by Dr. Gabriel Carina.  DISCHARGE DIAGNOSES:   1.  Uncontrolled type 1 diabetes mellitus.  2.  Acute renal failure.  3.  Hyperlipidemia.  4.  Metabolic acidosis.   DISCHARGE HOME MEDICATIONS:  1.  Lantus 46 units subcutaneous daily.  2.  Aspart insulin 10 units 3 times a day with meals.  3.  NovoLog sliding scale insulin.   DISCHARGE DIET: Carbohydrate controlled diet.   DISCHARGE ACTIVITY: As tolerated.    FOLLOWUP INSTRUCTIONS:  Advised to check fingersticks 2-3 times per day at least and get medical attention if sugar is greater than 400 or less than 60. PCP followup in 1-2 days at the prison needed. Repeat potassium check also recommend.  LABORATORIES AND IMAGING STUDIES PRIOR TO DISCHARGE: Sodium 138, repeat potassium was 4.7, chloride 108, bicarbonate 17, BUN 23, creatinine 1.05, glucose 295, and calcium of 9.2. WBC is 13.0, hemoglobin 12.0, hematocrit 37.9, platelet count 383,000. Sugar on admission was 799. The patient did have elevated lipase of 1500 of unknown source. Unfortunately HbA1c was not done.   BRIEF HOSPITAL COURSE: Mr. Angevine is a 34 year old African American male with history of type 1 diabetes mellitus on insulin, history of IV drug abuse, CKD, and pancreatitis in the past presents to the hospital secondary to elevated blood sugars from prison.  1.  Uncontrolled type 1 diabetes mellitus. Sugars in the 799 range on admission, was seen by endocrinologist, Dr. Gabriel Carina, and his insulin was being adjusted. He is currently being discharged on 46 units of Lantus once a day premeal insulin 3 times a day and also sliding scale insulin. He will need more close followup  blood sugars checks at the prison and need adjustment to his insulin regimen.  2.  Acute renal failure and hyperkalemia some mild metabolic acidosis. Known history of mild renal insufficiency, likely from his underlying diabetes mellitus. With fluids renal function has improved and 1 dose of Kayexalate has brought his potassium down.  3.  Elevated lipase with no symptoms of acute pancreatitis at this time. Previous history of pancreatitis, not sure if reactive or not; however, it was not repeated. The patient was not symptomatic and is being discharged back to prison at this time.   His course has been otherwise uneventful in the hospital.   DISCHARGE CONDITION: Stable.   DISCHARGE DISPOSITION: Back to prison.   TIME SPENT ON DISCHARGE: 40 minutes.    ____________________________ Gladstone Lighter, MD rk:bm D: 04/23/2014 16:26:04 ET T: 04/24/2014 01:35:38 ET JOB#: [id number]  cc: Gladstone Lighter, MD, <Dictator> Gladstone Lighter MD ELECTRONICALLY SIGNED 04/26/2014 17:00

## 2014-07-18 NOTE — H&P (Signed)
PATIENT NAME:  Luke [name], MALLEK MR#:  [id number] DATE OF BIRTH:  1981/02/13  DATE OF ADMISSION:  04/19/2014  REFERRING PHYSICIAN: Charlesetta Ivory, MD  PRIMARY CARE PHYSICIAN: Nonlocal, prison doctor.   ADMITTING PHYSICIAN: Azucena Freed, MD  CHIEF COMPLAINT:  1.  Elevated blood sugars.  2.  Increased thirst with increased urination.   HISTORY OF PRESENT ILLNESS: A 34 year old African American male with a past medical history of diabetes mellitus, on insulin, and IV drug usage, renal insufficiency and pancreatitis was brought with complaints of elevated blood sugars noted at the prison. He was also having some increased thirst and increased urination for the past few days. The patient was recently admitted at [facility] on 04/11/2014 and discharged on 01/25 for the same complaints of hyperglycemia and pancreatitis. The patient was evaluated by the ED physician and was found to have elevated blood sugars of 799 with no ketosis and was also noted to have elevated lipase consistent with pancreatitis. As mentioned earlier, the patient was recently admitted on April 11, 2014 at [facility] with the same complaints and was treated with IV insulin and was treated with n.p.o. and IV fluids and sent back to the prison with recovery. No history of any fever. No nausea. No vomiting. He does complain of some upper abdominal pain. No chest pain. No shortness of breath. No cough. No urinary symptoms, other than increased urination. The patient received IV fluids and also received 10 units of insulin IV and on his Accu-Cheks blood sugar was improving, but still remains significantly elevated in the range of around more than 600.   PAST MEDICAL HISTORY: 1.  Diabetes mellitus, type 2, on insulin.  2.  Pancreatitis.  3.  Renal insufficiency.  4.  IV drug usage.   PAST SURGICAL HISTORY: Left arm surgery for some ortho problem.   ALLERGIES: No known drug allergies.    SOCIAL HISTORY: He is married. He is incarcerated for the past 3 weeks. He smokes 1 pack per day for the past many years. He is an occasional drinker, but not drinking since he was incarcerated, and he a heroin and cocaine abuser. The last time he used was  3 weeks ago, before he was incarcerated.  FAMILY HISTORY: Father with diabetes and coronary artery disease.   ALLERGIES: No known drug allergies.   HOME MEDICATIONS: Lantus 10 units q. a.m. and NovoLog sliding scale.   REVIEW OF SYSTEMS: CONSTITUTIONAL: Negative for fever, chills. No fatigue. No generalized weakness.  EYES: Negative for blurred vision, double vision. No pain. No redness. No discharge.  EARS, NOSE, AND THROAT: Negative for tinnitus, ear pain, hearing loss, epistaxis, nasal discharge, difficulty swallowing.  RESPIRATORY: Negative for cough, wheezing, dyspnea, hemoptysis, or painful respiration.  CARDIOVASCULAR: Negative for chest pain, palpitations, dizziness, syncopal episodes, orthopnea, dyspnea on exertion, pedal edema.  GASTROINTESTINAL: Positive for upper abdominal pain, as noted in the history of present illness. Negative for nausea, vomiting, diarrhea, constipation, melena, and rectal bleeding.  GENITOURINARY: Negative for dysuria. Frequency of urination present.  ENDOCRINE: Polyuria and polydipsia present.  HEMATOLOGIC AND LYMPHATIC: Negative for anemia, easy bruising or bleeding.  INTEGUMENTARY: Negative for acne, skin rash, or lesions.  MUSCULOSKELETAL: Negative for neck or back pain. No arthritis. No gout.  NEUROLOGICAL: Negative for focal weakness or numbness. No history of CVA, TIA.  PSYCHIATRIC: Negative for anxiety, insomnia, depression.   PHYSICAL EXAMINATION: VITAL SIGNS: Temperature 98.3 degrees Fahrenheit, pulse rate 87 per minute, respirations 114 per minute,  blood pressure 150/94, O2 saturation 98% on room air.  GENERAL: Well-developed, well-nourished, appearing well, alert and oriented, in no acute  distress, comfortably resting in the bed.  HEAD: Atraumatic, normocephalic.  EYES: Pupils are equal and react to light and accommodation. No conjunctival pallor. No icterus. Extraocular movements intact.  NOSE: No drainage. No lesions.  EARS: No drainage. No external lesions.  ORAL CAVITY: No mucosal lesions. No exudates.  NECK: Supple. No JVD. No thyromegaly. No carotid bruit. Range of motion of neck within normal limits.  RESPIRATORY: Good respiratory effort. Not using accessory muscles of respiration. Bilateral vesicular sounds present. No rales or rhonchi.  CARDIOVASCULAR: S1, S2 regular. No murmurs, gallops, or clicks. Pulses equal at carotid, femoral, and pedal pulses. No peripheral edema.  GASTROINTESTINAL: Abdomen is soft. Mild tenderness in upper abdomen. No guarding. No rigidity. Bowel sounds present and equal in all 4 quadrants. No hepatosplenomegaly.  GENITOURINARY: Deferred.  MUSCULOSKELETAL: No joint tenderness or effusion. Range of motion is adequate. Strength and tone equal bilaterally.  SKIN: Inspection within normal limits. No obvious wounds. LYMPHATIC: No cervical lymphadenopathy.  VASCULAR: Good dorsalis pedis and posterior tibial pulses.  NEUROLOGICAL: Alert, awake, and oriented x3. Cranial nerves II through XII grossly intact. No sensory deficit. Motor strength 5/5 in both upper and lower extremities. DTRs 2+ bilateral and symmetrical.  PSYCHIATRIC: Alert, awake, and oriented x3. Judgment and insight adequate. Memory and mood within normal limits.   DIAGNOSTIC DATA: Labs: Serum glucose 199, BUN 27, creatinine 1.39, sodium 130, potassium 5.3, chloride 95, bicarb 24, total calcium 8.8. Lipase 1500. Total protein 6.7, albumin 3.6, total bili less than 0.1, alk phos 179, AST 30, ALT 88. WBC 13, hemoglobin 12, hematocrit 37.9, platelet count 383.   CT of the abdomen and the pelvis done on the 26th of January consistent with stranding in the pancreatic region consistent with acute  pancreatitis.   Ultrasound done on the 25th of January: No gallstones.   ASSESSMENT AND PLAN: He is a 34 year old African American male,  prison inmate,  with the past medical history of diabetes mellitus, on insulin, renal insufficiency, intravenous drug abuse, and pancreatitis, presents with high blood sugars, increased thirst and polyuria and upper abdominal pain. Work-up in the Emergency Room revealed uncontrolled diabetes mellitus and elevated blood sugars with elevated lipase consistent with recurrent pancreatitis. No ketoacidosis.  1.  Hyperglycemia. Plan: Admit to critical care unit stepdown unit. Vigorous IV hydration keeping n.p.o. Pain control measures. Subcutaneous insulin. Monitor blood sugars every 2 hours and monitor BMP every 4 hours.  2.  Recurrent pancreatitis likely related to uncontrolled hyperglycemia. Plan: Keep n.p.o., IV fluids, pain control measures and followup lipase.  3.  Uncontrolled diabetes mellitus. Plan: As mentioned in problem #1. 4.  Renal insufficiency, mild. Plan: Intravenous hydration. Follow up BMP.  5.  History of intravenous drug abuse. Not used for the past 3 weeks since incarceration in prison. 6.  Deep vein thrombosis prophylaxis. Subcutaneous Lovenox.  7.  Gastrointestinal prophylaxis. Ranitidine.   CODE STATUS: FULL code.   TIME SPENT: 45 minutes. ____________________________ Juluis Mire, MD enr:sb D: 04/19/2014 07:13:53 ET T: 04/19/2014 07:42:55 ET JOB#: [id number]  cc: Juluis Mire, MD, <Dictator> [place] Doctor Juluis Mire MD ELECTRONICALLY SIGNED 04/19/2014 17:00

## 2014-07-23 ENCOUNTER — Ambulatory Visit: Payer: Medicaid Other | Attending: Family Medicine | Admitting: Family Medicine

## 2014-07-23 ENCOUNTER — Encounter: Payer: Self-pay | Admitting: Family Medicine

## 2014-07-23 VITALS — BP 143/83 | HR 118 | Temp 98.8°F | Resp 16 | Wt 202.0 lb

## 2014-07-23 DIAGNOSIS — IMO0002 Reserved for concepts with insufficient information to code with codable children: Secondary | ICD-10-CM

## 2014-07-23 DIAGNOSIS — E1165 Type 2 diabetes mellitus with hyperglycemia: Secondary | ICD-10-CM

## 2014-07-23 DIAGNOSIS — Z720 Tobacco use: Secondary | ICD-10-CM

## 2014-07-23 DIAGNOSIS — L905 Scar conditions and fibrosis of skin: Secondary | ICD-10-CM

## 2014-07-23 DIAGNOSIS — R748 Abnormal levels of other serum enzymes: Secondary | ICD-10-CM

## 2014-07-23 DIAGNOSIS — D72829 Elevated white blood cell count, unspecified: Secondary | ICD-10-CM

## 2014-07-23 DIAGNOSIS — B353 Tinea pedis: Secondary | ICD-10-CM

## 2014-07-23 DIAGNOSIS — Z87898 Personal history of other specified conditions: Secondary | ICD-10-CM

## 2014-07-23 DIAGNOSIS — F1911 Other psychoactive substance abuse, in remission: Secondary | ICD-10-CM

## 2014-07-23 DIAGNOSIS — Z114 Encounter for screening for human immunodeficiency virus [HIV]: Secondary | ICD-10-CM

## 2014-07-23 DIAGNOSIS — E1129 Type 2 diabetes mellitus with other diabetic kidney complication: Secondary | ICD-10-CM

## 2014-07-23 DIAGNOSIS — I10 Essential (primary) hypertension: Secondary | ICD-10-CM

## 2014-07-23 DIAGNOSIS — K029 Dental caries, unspecified: Secondary | ICD-10-CM | POA: Insufficient documentation

## 2014-07-23 LAB — CBC
HCT: 45.1 % (ref 39.0–52.0)
HEMOGLOBIN: 15.1 g/dL (ref 13.0–17.0)
MCH: 28.7 pg (ref 26.0–34.0)
MCHC: 33.5 g/dL (ref 30.0–36.0)
MCV: 85.6 fL (ref 78.0–100.0)
MPV: 10.9 fL (ref 8.6–12.4)
Platelets: 337 10*3/uL (ref 150–400)
RBC: 5.27 MIL/uL (ref 4.22–5.81)
RDW: 14.2 % (ref 11.5–15.5)
WBC: 10 10*3/uL (ref 4.0–10.5)

## 2014-07-23 LAB — GLUCOSE, POCT (MANUAL RESULT ENTRY): POC Glucose: 301 mg/dl — AB (ref 70–99)

## 2014-07-23 LAB — POCT GLYCOSYLATED HEMOGLOBIN (HGB A1C): Hemoglobin A1C: 12

## 2014-07-23 LAB — COMPLETE METABOLIC PANEL WITH GFR
ALT: 59 U/L — ABNORMAL HIGH (ref 0–53)
AST: 48 U/L — ABNORMAL HIGH (ref 0–37)
Albumin: 4.3 g/dL (ref 3.5–5.2)
Alkaline Phosphatase: 138 U/L — ABNORMAL HIGH (ref 39–117)
BILIRUBIN TOTAL: 0.6 mg/dL (ref 0.2–1.2)
BUN: 21 mg/dL (ref 6–23)
CO2: 21 meq/L (ref 19–32)
CREATININE: 1.31 mg/dL (ref 0.50–1.35)
Calcium: 9.5 mg/dL (ref 8.4–10.5)
Chloride: 103 mEq/L (ref 96–112)
GFR, EST NON AFRICAN AMERICAN: 71 mL/min
GFR, Est African American: 82 mL/min
GLUCOSE: 215 mg/dL — AB (ref 70–99)
Potassium: 4.4 mEq/L (ref 3.5–5.3)
Sodium: 138 mEq/L (ref 135–145)
Total Protein: 7.2 g/dL (ref 6.0–8.3)

## 2014-07-23 LAB — POCT URINALYSIS DIPSTICK
Bilirubin, UA: NEGATIVE
Blood, UA: NEGATIVE
Glucose, UA: 500
Ketones, UA: NEGATIVE
LEUKOCYTES UA: NEGATIVE
Nitrite, UA: NEGATIVE
PH UA: 6
Spec Grav, UA: 1.02
UROBILINOGEN UA: 0.2

## 2014-07-23 MED ORDER — TRIAMCINOLONE ACETONIDE 0.1 % EX CREA: 1.0000 "application " | TOPICAL_CREAM | Freq: Two times a day (BID) | CUTANEOUS | Status: DC

## 2014-07-23 MED ORDER — INSULIN GLARGINE 300 UNIT/ML ~~LOC~~ SOPN: 10.0000 [IU] | PEN_INJECTOR | Freq: Every evening | SUBCUTANEOUS | Status: DC | PRN

## 2014-07-23 MED ORDER — INSULIN ASPART 100 UNIT/ML ~~LOC~~ SOLN: 5.0000 [IU] | Freq: Three times a day (TID) | SUBCUTANEOUS | Status: DC

## 2014-07-23 MED ORDER — NICOTINE 7 MG/24HR TD PT24: 7.0000 mg | MEDICATED_PATCH | Freq: Every day | TRANSDERMAL | Status: DC

## 2014-07-23 MED ORDER — NICOTINE 21 MG/24HR TD PT24
21.0000 mg | MEDICATED_PATCH | Freq: Every day | TRANSDERMAL | Status: DC
Start: 2014-07-23 — End: 2014-12-02

## 2014-07-23 MED ORDER — "PEN NEEDLES 3/16"" 31G X 5 MM MISC": 1.0000 | Freq: Three times a day (TID) | Status: DC

## 2014-07-23 MED ORDER — TERBINAFINE HCL 1 % EX CREA: 1.0000 "application " | TOPICAL_CREAM | Freq: Two times a day (BID) | CUTANEOUS | Status: DC

## 2014-07-23 MED ORDER — ACCU-CHEK SOFTCLIX LANCETS MISC: 1.0000 | Freq: Three times a day (TID) | Status: DC

## 2014-07-23 MED ORDER — GLUCOSE BLOOD VI STRP: 1.0000 | ORAL_STRIP | Freq: Three times a day (TID) | Status: DC

## 2014-07-23 MED ORDER — LISINOPRIL 10 MG PO TABS: 10.0000 mg | ORAL_TABLET | Freq: Every day | ORAL | Status: DC

## 2014-07-23 MED ORDER — INSULIN GLARGINE 100 UNIT/ML SOLOSTAR PEN: 10.0000 [IU] | PEN_INJECTOR | Freq: Every day | SUBCUTANEOUS | Status: DC

## 2014-07-23 MED ORDER — "PEN NEEDLES 3/16"" 31G X 5 MM MISC": 1.0000 | Freq: Once | Status: DC

## 2014-07-23 MED ORDER — ACCU-CHEK SOFTCLIX LANCET DEV MISC: 1.0000 | Freq: Three times a day (TID) | Status: DC

## 2014-07-23 MED ORDER — INSULIN SYRINGE-NEEDLE U-100 31G X 5/16" 0.5 ML MISC
1.0000 | Freq: Three times a day (TID) | Status: DC
Start: 2014-07-23 — End: 2015-05-16

## 2014-07-23 MED ORDER — ACCU-CHEK AVIVA PLUS W/DEVICE KIT: 1.0000 | PACK | Freq: Three times a day (TID) | Status: DC

## 2014-07-23 MED ORDER — NICOTINE 14 MG/24HR TD PT24: 14.0000 mg | MEDICATED_PATCH | Freq: Every day | TRANSDERMAL | Status: DC

## 2014-07-23 NOTE — Patient Instructions (Addendum)
Mr. Maybank,  Thank you for coming in today. It was a pleasure meeting you. I look forward to being your primary doctor.  1. Diabetes Goal A1c < 7 Diabetes blood sugar goals  Fasting (in AM before breakfast, 8 hrs of no eating or drinking (except water or unsweetened coffee or tea): 90-110 2 hrs after meals: < 160,   No low sugars: nothing < 70    2. HTN Goal BP < 140/90  Start lisinopril 10 mg daily   3. Referrals to ophthalmology and dental  4. Foot rash: lamisil cream   F/u in 4 weeks for diabetes and HTN  F/u with me in 3 months   Dr. Adrian Blackwater

## 2014-07-23 NOTE — Assessment & Plan Note (Signed)
A: smoker, 1/2 PPD, desires to quit P: Patch 21 mg x 6 weeks 14 mg x 2 weeks 7 mg x 2 weeks

## 2014-07-23 NOTE — Assessment & Plan Note (Addendum)
Screening HIV ordered  Screening HIV negative  

## 2014-07-23 NOTE — Assessment & Plan Note (Signed)
A: scars on arm from IVDU P:  kenalog cream time

## 2014-07-23 NOTE — Assessment & Plan Note (Signed)
Dental referral placed.

## 2014-07-23 NOTE — Addendum Note (Signed)
Addended by: Boykin Nearing on: 07/23/2014 02:12 PM   Modules accepted: Orders, Medications

## 2014-07-23 NOTE — Assessment & Plan Note (Addendum)
Diabetes: uncontrolled P: Goal A1c < 7 Diabetes blood sugar goals  Fasting (in AM before breakfast, 8 hrs of no eating or drinking (except water or unsweetened coffee or tea): 90-110 2 hrs after meals: < 160,   No low sugars: nothing < 70   lantus 10 U nightly novolog 5 U TID

## 2014-07-23 NOTE — Assessment & Plan Note (Signed)
A; tinea pedis on L foot P: lamisil cream

## 2014-07-23 NOTE — Progress Notes (Signed)
   Subjective:    Patient ID: Luke [name], male    DOB: 08-05-1980, 34 y.o.   MRN: [id number] CC: f/u DM and HTN  HPI In prison for last 4 months   1. CHRONIC DIABETES  Disease Monitoring  Blood Sugar Ranges: not checking   Polyuria: no   Visual problems: yes, distant vision is poor    Medication Compliance: no, only takin 70/30 40 U BID, no novolog   Medication Side Effects  Hypoglycemia: no   Preventitive Health Care  Eye Exam: due   Foot Exam: done today  Diet pattern: 3 meals  Exercise: minimal, has gained weight   2. CHRONIC HYPERTENSION  Disease Monitoring  Blood pressure range: not checking   Chest pain: no   Dyspnea: no   Claudication: no   Medication compliance: not taking any   Medication Side Effects  Lightheadedness: no   Urinary frequency: no   Edema: no   Impotence: no   Soc Hx: smoker 1/2 PPD, desires to quit  Review of Systems  Constitutional: Negative for fever and chills.  Endocrine: Negative.        Objective:   Physical Exam BP 143/83 mmHg  Pulse 118  Temp(Src) 98.8 F (37.1 C) (Oral)  Resp 16  Wt 202 lb (91.627 kg)  SpO2 98% General appearance: alert, cooperative and no distress Lungs: clear to auscultation bilaterally  Eyes: EOMI, PERRLA  Ears: normal Throat: poor dentition Heart: regular rate and rhythm, S1, S2 normal, no murmur, click, rub or gallop Extremities: extremities normal, atraumatic, no cyanosis or edema  Skin: maceration between 3rd and 4th toe on L foot, scaly rash on L medial foot, hyperpigmented papules on skin from scarring from IVDU history   Lab Results  Component Value Date   HGBA1C 12 07/23/2014   CBG 301      Assessment & Plan:

## 2014-07-23 NOTE — Progress Notes (Signed)
F/U DM  Stated has Breakfast 11:00 Insulin 40 units of 70/30 at 8:30 Hx Tobacco - 1pper 2 day

## 2014-07-24 LAB — HIV ANTIBODY (ROUTINE TESTING W REFLEX): HIV 1&2 Ab, 4th Generation: NONREACTIVE

## 2014-07-26 DIAGNOSIS — R748 Abnormal levels of other serum enzymes: Secondary | ICD-10-CM | POA: Insufficient documentation

## 2014-07-26 NOTE — Assessment & Plan Note (Signed)
Slightly elevated liver enzymes and blood sugar on CMP, will screen for viral hepatitis at next OV given hx of IVDU.

## 2014-07-26 NOTE — Addendum Note (Signed)
Addended by: Boykin Nearing on: 07/26/2014 09:17 AM   Modules accepted: Orders

## 2014-07-26 NOTE — Assessment & Plan Note (Signed)
Resolved

## 2014-07-29 ENCOUNTER — Other Ambulatory Visit: Payer: Self-pay | Admitting: Family Medicine

## 2014-07-29 MED ORDER — GLUCOSE BLOOD VI STRP: 1.0000 | ORAL_STRIP | Freq: Three times a day (TID) | Status: DC

## 2014-08-10 ENCOUNTER — Telehealth: Payer: Self-pay | Admitting: Family Medicine

## 2014-08-10 NOTE — Telephone Encounter (Signed)
Pt was taking 70/30 before and was changed to [place] recently and his sugar is still really high. Please follow up with pt for advise and possible medication or dosage change.

## 2014-11-12 ENCOUNTER — Other Ambulatory Visit: Payer: Self-pay | Admitting: Family Medicine

## 2014-11-16 ENCOUNTER — Telehealth: Payer: Self-pay | Admitting: Family Medicine

## 2014-11-16 NOTE — Telephone Encounter (Signed)
Patient called requesting medication refill for insulin aspart (NOVOLOG) 100 UNIT/ML injection, please f/u with patient

## 2014-11-16 NOTE — Telephone Encounter (Signed)
Unable to contact Pt,  not accepting call at this time.   Pt has Rx at [facility]  need to call pharmacy for refills

## 2014-11-18 ENCOUNTER — Other Ambulatory Visit: Payer: Self-pay | Admitting: Family Medicine

## 2014-11-18 DIAGNOSIS — E1165 Type 2 diabetes mellitus with hyperglycemia: Principal | ICD-10-CM

## 2014-11-18 DIAGNOSIS — E1129 Type 2 diabetes mellitus with other diabetic kidney complication: Secondary | ICD-10-CM

## 2014-11-18 DIAGNOSIS — IMO0002 Reserved for concepts with insufficient information to code with codable children: Secondary | ICD-10-CM

## 2014-11-18 MED ORDER — INSULIN GLARGINE 100 UNIT/ML SOLOSTAR PEN: 10.0000 [IU] | PEN_INJECTOR | Freq: Every day | SUBCUTANEOUS | Status: DC

## 2014-11-18 MED ORDER — INSULIN ASPART 100 UNIT/ML ~~LOC~~ SOLN: 5.0000 [IU] | Freq: Three times a day (TID) | SUBCUTANEOUS | Status: DC

## 2014-11-18 MED ORDER — INSULIN NPH ISOPHANE & REGULAR (70-30) 100 UNIT/ML ~~LOC~~ SUSP: 50.0000 [IU] | Freq: Two times a day (BID) | SUBCUTANEOUS | Status: DC

## 2014-12-02 ENCOUNTER — Encounter (HOSPITAL_COMMUNITY): Payer: Self-pay | Admitting: Emergency Medicine

## 2014-12-02 ENCOUNTER — Inpatient Hospital Stay (HOSPITAL_COMMUNITY)
Admission: EM | Admit: 2014-12-02 | Discharge: 2014-12-05 | DRG: 603 | Disposition: A | Discharge status: Home / Self Care | Payer: Medicaid Other | Attending: Family Medicine | Admitting: Family Medicine

## 2014-12-02 ENCOUNTER — Emergency Department (HOSPITAL_COMMUNITY): Payer: Medicaid Other

## 2014-12-02 DIAGNOSIS — R609 Edema, unspecified: Secondary | ICD-10-CM

## 2014-12-02 DIAGNOSIS — F419 Anxiety disorder, unspecified: Secondary | ICD-10-CM | POA: Diagnosis present

## 2014-12-02 DIAGNOSIS — I10 Essential (primary) hypertension: Secondary | ICD-10-CM | POA: Diagnosis present

## 2014-12-02 DIAGNOSIS — I129 Hypertensive chronic kidney disease with stage 1 through stage 4 chronic kidney disease, or unspecified chronic kidney disease: Secondary | ICD-10-CM | POA: Diagnosis present

## 2014-12-02 DIAGNOSIS — F17210 Nicotine dependence, cigarettes, uncomplicated: Secondary | ICD-10-CM | POA: Diagnosis present

## 2014-12-02 DIAGNOSIS — E1165 Type 2 diabetes mellitus with hyperglycemia: Secondary | ICD-10-CM | POA: Diagnosis present

## 2014-12-02 DIAGNOSIS — Z79899 Other long term (current) drug therapy: Secondary | ICD-10-CM

## 2014-12-02 DIAGNOSIS — R739 Hyperglycemia, unspecified: Secondary | ICD-10-CM

## 2014-12-02 DIAGNOSIS — E871 Hypo-osmolality and hyponatremia: Secondary | ICD-10-CM | POA: Diagnosis present

## 2014-12-02 DIAGNOSIS — F1190 Opioid use, unspecified, uncomplicated: Secondary | ICD-10-CM | POA: Diagnosis present

## 2014-12-02 DIAGNOSIS — F1910 Other psychoactive substance abuse, uncomplicated: Secondary | ICD-10-CM | POA: Diagnosis present

## 2014-12-02 DIAGNOSIS — L03115 Cellulitis of right lower limb: Secondary | ICD-10-CM | POA: Insufficient documentation

## 2014-12-02 DIAGNOSIS — F1911 Other psychoactive substance abuse, in remission: Secondary | ICD-10-CM

## 2014-12-02 DIAGNOSIS — L0390 Cellulitis, unspecified: Secondary | ICD-10-CM | POA: Diagnosis present

## 2014-12-02 DIAGNOSIS — Z720 Tobacco use: Secondary | ICD-10-CM | POA: Diagnosis present

## 2014-12-02 DIAGNOSIS — D649 Anemia, unspecified: Secondary | ICD-10-CM | POA: Diagnosis present

## 2014-12-02 DIAGNOSIS — E1122 Type 2 diabetes mellitus with diabetic chronic kidney disease: Secondary | ICD-10-CM | POA: Diagnosis present

## 2014-12-02 DIAGNOSIS — N189 Chronic kidney disease, unspecified: Secondary | ICD-10-CM | POA: Diagnosis present

## 2014-12-02 DIAGNOSIS — F1410 Cocaine abuse, uncomplicated: Secondary | ICD-10-CM | POA: Diagnosis present

## 2014-12-02 DIAGNOSIS — Z794 Long term (current) use of insulin: Secondary | ICD-10-CM

## 2014-12-02 DIAGNOSIS — K219 Gastro-esophageal reflux disease without esophagitis: Secondary | ICD-10-CM | POA: Diagnosis present

## 2014-12-02 DIAGNOSIS — N179 Acute kidney failure, unspecified: Secondary | ICD-10-CM | POA: Diagnosis present

## 2014-12-02 DIAGNOSIS — M79605 Pain in left leg: Secondary | ICD-10-CM | POA: Diagnosis present

## 2014-12-02 LAB — CBC WITH DIFFERENTIAL/PLATELET
BASOS PCT: 0 %
Basophils Absolute: 0 10*3/uL (ref 0.0–0.1)
EOS ABS: 0.3 10*3/uL (ref 0.0–0.7)
EOS PCT: 2 %
HEMATOCRIT: 39.2 % (ref 39.0–52.0)
Hemoglobin: 13 g/dL (ref 13.0–17.0)
LYMPHS ABS: 3.7 10*3/uL (ref 0.7–4.0)
Lymphocytes Relative: 25 %
MCH: 28.4 pg (ref 26.0–34.0)
MCHC: 33.2 g/dL (ref 30.0–36.0)
MCV: 85.6 fL (ref 78.0–100.0)
MONO ABS: 1.5 10*3/uL — AB (ref 0.1–1.0)
Monocytes Relative: 10 %
Neutro Abs: 9.4 10*3/uL — ABNORMAL HIGH (ref 1.7–7.7)
Neutrophils Relative %: 63 %
Platelets: 509 10*3/uL — ABNORMAL HIGH (ref 150–400)
RBC: 4.58 MIL/uL (ref 4.22–5.81)
RDW: 13.5 % (ref 11.5–15.5)
WBC: 14.9 10*3/uL — AB (ref 4.0–10.5)

## 2014-12-02 LAB — CBG MONITORING, ED: GLUCOSE-CAPILLARY: 456 mg/dL — AB (ref 65–99)

## 2014-12-02 LAB — COMPREHENSIVE METABOLIC PANEL
ALK PHOS: 133 U/L — AB (ref 38–126)
ALT: 17 U/L (ref 17–63)
ANION GAP: 13 (ref 5–15)
AST: 26 U/L (ref 15–41)
Albumin: 3.2 g/dL — ABNORMAL LOW (ref 3.5–5.0)
BILIRUBIN TOTAL: 0.2 mg/dL — AB (ref 0.3–1.2)
BUN: 12 mg/dL (ref 6–20)
CALCIUM: 9.2 mg/dL (ref 8.9–10.3)
CO2: 25 mmol/L (ref 22–32)
Chloride: 91 mmol/L — ABNORMAL LOW (ref 101–111)
Creatinine, Ser: 1.67 mg/dL — ABNORMAL HIGH (ref 0.61–1.24)
GFR calc non Af Amer: 52 mL/min — ABNORMAL LOW (ref >60)
Glucose, Bld: 800 mg/dL (ref 65–99)
Potassium: 4.4 mmol/L (ref 3.5–5.1)
SODIUM: 129 mmol/L — AB (ref 135–145)
TOTAL PROTEIN: 7.3 g/dL (ref 6.5–8.1)

## 2014-12-02 LAB — RAPID URINE DRUG SCREEN, HOSP PERFORMED
Amphetamines: NOT DETECTED
BENZODIAZEPINES: POSITIVE — AB
Barbiturates: NOT DETECTED
COCAINE: POSITIVE — AB
OPIATES: POSITIVE — AB
Tetrahydrocannabinol: NOT DETECTED

## 2014-12-02 MED ORDER — VANCOMYCIN HCL IN DEXTROSE 1-5 GM/200ML-% IV SOLN
1000.0000 mg | Freq: Once | INTRAVENOUS | Status: AC
  Administered 2014-12-03: 1000 mg via INTRAVENOUS
  Filled 2014-12-02: qty 200

## 2014-12-02 MED ORDER — ONDANSETRON HCL 4 MG/2ML IJ SOLN
4.0000 mg | Freq: Once | INTRAMUSCULAR | Status: AC
  Administered 2014-12-02: 4 mg via INTRAVENOUS
  Filled 2014-12-02: qty 2

## 2014-12-02 MED ORDER — MORPHINE SULFATE (PF) 4 MG/ML IV SOLN
4.0000 mg | Freq: Once | INTRAVENOUS | Status: AC
  Administered 2014-12-02: 4 mg via INTRAVENOUS
  Filled 2014-12-02: qty 1

## 2014-12-02 MED ORDER — INSULIN ASPART PROT & ASPART (70-30 MIX) 100 UNIT/ML ~~LOC~~ SUSP
50.0000 [IU] | Freq: Once | SUBCUTANEOUS | Status: AC
  Administered 2014-12-03: 50 [IU] via SUBCUTANEOUS
  Filled 2014-12-02: qty 10

## 2014-12-02 MED ORDER — SODIUM CHLORIDE 0.9 % IV BOLUS (SEPSIS)
1000.0000 mL | Freq: Once | INTRAVENOUS | Status: AC
  Administered 2014-12-03: 1000 mL via INTRAVENOUS

## 2014-12-02 MED ORDER — SODIUM CHLORIDE 0.9 % IV BOLUS (SEPSIS)
1000.0000 mL | Freq: Once | INTRAVENOUS | Status: AC
  Administered 2014-12-02: 1000 mL via INTRAVENOUS

## 2014-12-02 NOTE — ED Provider Notes (Signed)
CSN: [id number]     Arrival date & time 12/02/14  1807 History   First MD Initiated Contact with Patient 12/02/14 2219     Chief Complaint  Patient presents with  . Leg Pain     (Consider location/radiation/quality/duration/timing/severity/associated sxs/prior Treatment) HPI Comments: Patient is a 34 year old male with a history of IDDM, GERD, and IV polysubstance abuse who presents to the emergency department for pain in his right lower extremity. Patient states the pain began 4 days ago with associated redness and swelling. Patient denies injecting drugs into his right lower extremity. He states that he only shoots up in his arms. His last use of IV drugs was approximately a week ago. He reports using cocaine. Patient reports a constant and worsening pain in his right lower extremity. Pain is worse with weightbearing and he has been unable to weight-bear recently due to the pain. Patient has sores to the dorsal and medial aspects of his right foot/ankle. He states that these are chronic and have been present for approximately one month. Patient states that he has been compliant with his insulin regimen, but has not taken his nightly dose today. Patient is [facility] for [facility] care. He denies fever, vomiting, extremity numbness, drainage, trauma/injury.  Patient is a 34 y.o. male presenting with leg pain. The history is provided by the patient. No language interpreter was used.  Leg Pain Associated symptoms: no fever     Past Medical History  Diagnosis Date  . Pneumonia   . Headache(784.0)   . Anxiety  Dx 2008  . Diabetes mellitus without complication Dx 1572  . GERD (gastroesophageal reflux disease) Dx 2008  . Anemia   . Seizures 2011     x 2 in lifetime. on Dilantin for a while.   . 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 Irrigation and debridement abscess      Hx: of left arm  abscess related to drug use   . I&d extremity Left 10/11/2012    Procedure: IRRIGATION AND DEBRIDEMENT ABSCESS FOREARM;  Surgeon: Linna Hoff, MD;  Location: [place];  Service: Orthopedics;  Laterality: Left;  . I&d extremity Left 10/12/2012    Procedure: IRRIGATION AND DEBRIDEMENT FOREARM;  Surgeon: Linna Hoff, MD;  Location: [place];  Service: Orthopedics;  Laterality: Left;  . I&d extremity Left 10/14/2012    Procedure: incision and drainage left forearm;  Surgeon: Linna Hoff, MD;  Location: [place];  Service: Orthopedics;  Laterality: Left;  . I&d extremity Left 10/16/2012    Procedure: IRRIGATION AND DEBRIDEMENT LEFT FOREARM;  Surgeon: Linna Hoff, MD;  Location: [place];  Service: Orthopedics;  Laterality: Left;  . I&d extremity Left 10/20/2012    Procedure: INCISION AND DRAINAGE AND DEBRIDEMENT LEFT  FOREARM;  Surgeon: Linna Hoff, MD;  Location: [place];  Service: Orthopedics;  Laterality: Left;   Family History  Problem Relation Age of Onset  . Diabetes Father   . Heart disease Father   . Mental illness Sister   . Cancer Neg Hx    Social History  Substance Use Topics  . Smoking status: Current Every Day Smoker -- 1.00 packs/day    Types: Cigarettes  . Smokeless tobacco: Never Used  . Alcohol Use: Yes     Comment: occasional    Review of Systems  Constitutional: Positive for chills. Negative for fever.  Respiratory: Negative for shortness of breath.   Cardiovascular: Negative for chest pain.  Gastrointestinal: Negative for nausea and vomiting.  Musculoskeletal: Positive for myalgias and joint swelling.  Skin: Positive for color change and wound.  All other systems reviewed and are negative.   Allergies  Review of patient's allergies indicates no known allergies.  Home Medications   Prior to Admission medications   Medication Sig Start Date End Date Taking? Authorizing Provider  ACCU-CHEK SOFTCLIX LANCETS lancets 1 each by Other route 3 (three) times daily. 07/23/14   Yes Boykin Nearing, MD  Blood Glucose Monitoring Suppl (BLOOD GLUCOSE METER KIT AND SUPPLIES) Dispense based on patient and insurance preference. Use up to four times daily as directed. (FOR ICD-9 250.00, 250.01). 11/25/13  Yes Donita Brooks, NP  esomeprazole (NEXIUM) 40 MG capsule Take 40 mg by mouth daily before breakfast.   Yes Historical Provider, MD  glucose blood (ACCU-CHEK SMARTVIEW) test strip 1 each by Other route 3 (three) times daily. Use as instructed 07/29/14  Yes Josalyn Funches, MD  insulin aspart (NOVOLOG) 100 UNIT/ML injection Inject 4-16 Units into the skin 3 (three) times daily with meals. 5 units with each meal 250-300 = 4 units 301-350 = 8 units 351-400 = 12 units >450=16 units   Yes Historical Provider, MD  insulin NPH-regular Human (NOVOLIN 70/30) (70-30) 100 UNIT/ML injection Inject 50 Units into the skin 2 (two) times daily with a meal. 11/18/14  Yes Josalyn Funches, MD  Insulin Pen Needle (PEN NEEDLES 3/16") 31G X 5 MM MISC 1 each by Does not apply route once. 07/23/14  Yes Josalyn Funches, MD  Insulin Syringe-Needle U-100 (B-D INS SYRINGE 0.5CC/31GX5/16) 31G X 5/16" 0.5 ML MISC 1 each by Does not apply route 3 (three) times daily. 07/23/14  Yes Josalyn Funches, MD  Insulin Syringes, Disposable, U-100 0.5 ML MISC 1 each by Does not apply route 3 (three) times daily as needed. 11/25/13  Yes Donita Brooks, NP  Lancet Devices [facility]) lancets 1 each by Other route 3 (three) times daily. 07/23/14  Yes Josalyn Funches, MD  lisinopril (PRINIVIL,ZESTRIL) 10 MG tablet Take 1 tablet (10 mg total) by mouth daily. 07/23/14  Yes Josalyn Funches, MD   BP 153/88 mmHg  Pulse 91  Temp(Src) 98.8 F (37.1 C) (Oral)  Resp 14  Ht $R'5\' 9"'YV$  (1.753 m)  Wt 185 lb (83.915 kg)  BMI 27.31 kg/m2  SpO2 100%   Physical Exam  Constitutional: He is oriented to person, place, and time. He appears well-developed and well-nourished. No distress.  Patient appears mildly diaphoretic and fatigued    HENT:  Head: Normocephalic and atraumatic.  Eyes: Conjunctivae and EOM are normal. No scleral icterus.  Neck: Normal range of motion.  Cardiovascular: Regular rhythm and intact distal pulses.   DP pulses 2+ in the right lower extremity. PT pulses 1+ in the right lower extremity. Rate is tachycardic.  Pulmonary/Chest: Effort normal. No respiratory distress. He has no wheezes.  Respirations even and unlabored  Musculoskeletal: Normal range of motion.       Right ankle: He exhibits swelling. He exhibits normal range of motion. Tenderness.       Right lower leg: He exhibits tenderness and swelling.       Legs:      Right foot: There is tenderness and swelling. There is normal range of motion, no crepitus and no deformity.  Patient with numerous ulcerative sores to b/l hands from hx of IVDU. There is erythema and edema  as well as heat to touch in the RLE, from below the knee distally. Patient is exquisitely tender to palpation distal to his R knee.  Neurological: He is alert and oriented to person, place, and time. He exhibits normal muscle tone. Coordination normal.  Sensation to light touch intact in the RLE. Patient able to wiggle all toes.  Skin: Skin is warm and dry. No rash noted. There is erythema. No pallor.  Crusted ulceration noted to the dorsal R foot and the medial R ankle; neither appear acute. No drainage or bleeding.  Psychiatric: He has a normal mood and affect. His behavior is normal.  Nursing note and vitals reviewed.   ED Course  Procedures (including critical care time) Labs Review Labs Reviewed  CBC WITH DIFFERENTIAL/PLATELET - Abnormal; Notable for the following:    WBC 14.9 (*)    Platelets 509 (*)    Neutro Abs 9.4 (*)    Monocytes Absolute 1.5 (*)    All other components within normal limits  COMPREHENSIVE METABOLIC PANEL - Abnormal; Notable for the following:    Sodium 129 (*)    Chloride 91 (*)    Glucose, Bld 800 (*)    Creatinine, Ser 1.67 (*)    Albumin  3.2 (*)    Alkaline Phosphatase 133 (*)    Total Bilirubin 0.2 (*)    GFR calc non Af Amer 52 (*)    All other components within normal limits  URINE RAPID DRUG SCREEN, HOSP PERFORMED - Abnormal; Notable for the following:    Opiates POSITIVE (*)    Cocaine POSITIVE (*)    Benzodiazepines POSITIVE (*)    All other components within normal limits  CBG MONITORING, ED - Abnormal; Notable for the following:    Glucose-Capillary 456 (*)    All other components within normal limits  CBG MONITORING, ED - Abnormal; Notable for the following:    Glucose-Capillary >600 (*)    All other components within normal limits  CULTURE, BLOOD (ROUTINE X 2)  CULTURE, BLOOD (ROUTINE X 2)  BASIC METABOLIC PANEL  C-REACTIVE PROTEIN  CK  CBG MONITORING, ED  CBG MONITORING, ED    Imaging Review Dg Tibia/fibula Right  12/03/2014   CLINICAL DATA:  Redness, swelling and pain right lower leg. Right foot wound.  EXAM: RIGHT TIBIA AND FIBULA - 2 VIEW  COMPARISON:  None.  FINDINGS: There is no evidence of fracture or other focal bone lesions. Soft tissues are unremarkable.  IMPRESSION: Negative.   Electronically Signed   By: Marin Olp M.D.   On: 12/03/2014 00:19   Dg Ankle Complete Right  12/03/2014   CLINICAL DATA:  Cellulitis, type 1 diabetic.  EXAM: RIGHT ANKLE - COMPLETE 3+ VIEW; RIGHT FOOT COMPLETE - 3+ VIEW  COMPARISON:  None.  FINDINGS: No fracture deformity nor dislocation. Old lateral malleolus avulsion injury. The ankle mortise appears congruent and the tibiofibular syndesmosis intact. No destructive bony lesions. Medial hindfoot soft tissue swelling without subcutaneous gas or radiopaque foreign bodies. Small probable ulceration medial ankle soft tissues.  IMPRESSION: Soft tissue swelling compatible with cellulitis with small probable ulcer, medial ankle.  No acute osseous process.   Electronically Signed   By: Elon Alas M.D.   On: 12/03/2014 00:22   Dg Foot Complete Right  12/03/2014    CLINICAL DATA:  Cellulitis, type 1 diabetic.  EXAM: RIGHT ANKLE - COMPLETE 3+ VIEW; RIGHT FOOT COMPLETE - 3+ VIEW  COMPARISON:  None.  FINDINGS: No fracture deformity nor dislocation.  Old lateral malleolus avulsion injury. The ankle mortise appears congruent and the tibiofibular syndesmosis intact. No destructive bony lesions. Medial hindfoot soft tissue swelling without subcutaneous gas or radiopaque foreign bodies. Small probable ulceration medial ankle soft tissues.  IMPRESSION: Soft tissue swelling compatible with cellulitis with small probable ulcer, medial ankle.  No acute osseous process.   Electronically Signed   By: Elon Alas M.D.   On: 12/03/2014 00:22     I have personally reviewed and evaluated these images and lab results as part of my medical decision-making.   EKG Interpretation None       Medications  sodium chloride 0.9 % bolus 1,000 mL (not administered)  vancomycin (VANCOCIN) IVPB 1000 mg/200 mL premix (1,000 mg Intravenous New Bag/Given 12/03/14 0010)  sodium chloride 0.9 % bolus 1,000 mL (1,000 mLs Intravenous New Bag/Given 12/02/14 2252)  morphine 4 MG/ML injection 4 mg (4 mg Intravenous Given 12/02/14 2252)  ondansetron (ZOFRAN) injection 4 mg (4 mg Intravenous Given 12/02/14 2252)  insulin aspart protamine- aspart (NOVOLOG MIX 70/30) injection 50 Units (50 Units Subcutaneous Given 12/03/14 0003)    MDM   Final diagnoses:  Cellulitis of right lower extremity  Hyperglycemia    34 year old male with a history of IV drug abuse presents to the emergency department for evaluation of right lower extremity swelling and pain. Physical exam findings are consistent with cellulitis of the right lower extremity. No evidence of subcutaneous gas on x-ray. IV vancomycin initiated. Patient is neurovascularly intact. He is afebrile. Vitals stable over ED course.  Patient also hyperglycemic without evidence of DKA. Patient ordered to be given 2 L of IV fluid as well as his  nightly dose of NovoLog. Will recheck sugar prior to initiating insulin drip.  Patient to be admitted for further treatment with IV antibiotics. Blood culture sent given history of IV drug abuse. Case discussed with Dr. Posey Pronto of [facility] who will admit. Dr. Posey Pronto to enter holding orders.   Filed Vitals:   12/02/14 2047 12/02/14 2300 12/02/14 2357 12/03/14 0000  BP: 128/80  145/87 153/88  Pulse: 112 98 91 91  Temp:      TempSrc:      Resp: 14     Height:      Weight:      SpO2: 100% 100% 99% 100%     Antonietta Breach, PA-C 12/03/14 0130  Davonna Belling, MD 12/06/14 [phone]

## 2014-12-02 NOTE — ED Notes (Signed)
Critical glucose of 800 received from lab. EDP Humes aware.

## 2014-12-02 NOTE — ED Notes (Addendum)
Pt c/o pain in right lower leg, onset 3-4 days ago.  St's unable to put any weight on it. Pt is diabetic and st's has a sore on his foot.  Lower leg red and swollen

## 2014-12-02 NOTE — ED Notes (Signed)
Unable to obtain blood. Attempt x1. Patient stated hx of same stated US IV and Central lines used previously for access

## 2014-12-03 ENCOUNTER — Inpatient Hospital Stay (HOSPITAL_COMMUNITY): Payer: Medicaid Other

## 2014-12-03 DIAGNOSIS — E1122 Type 2 diabetes mellitus with diabetic chronic kidney disease: Secondary | ICD-10-CM | POA: Diagnosis present

## 2014-12-03 DIAGNOSIS — M79605 Pain in left leg: Secondary | ICD-10-CM | POA: Diagnosis present

## 2014-12-03 DIAGNOSIS — F419 Anxiety disorder, unspecified: Secondary | ICD-10-CM | POA: Diagnosis present

## 2014-12-03 DIAGNOSIS — I10 Essential (primary) hypertension: Secondary | ICD-10-CM | POA: Diagnosis not present

## 2014-12-03 DIAGNOSIS — L03115 Cellulitis of right lower limb: Secondary | ICD-10-CM | POA: Diagnosis not present

## 2014-12-03 DIAGNOSIS — N179 Acute kidney failure, unspecified: Secondary | ICD-10-CM | POA: Diagnosis present

## 2014-12-03 DIAGNOSIS — E1129 Type 2 diabetes mellitus with other diabetic kidney complication: Secondary | ICD-10-CM | POA: Diagnosis not present

## 2014-12-03 DIAGNOSIS — I129 Hypertensive chronic kidney disease with stage 1 through stage 4 chronic kidney disease, or unspecified chronic kidney disease: Secondary | ICD-10-CM | POA: Diagnosis present

## 2014-12-03 DIAGNOSIS — E871 Hypo-osmolality and hyponatremia: Secondary | ICD-10-CM | POA: Diagnosis present

## 2014-12-03 DIAGNOSIS — Z87898 Personal history of other specified conditions: Secondary | ICD-10-CM | POA: Diagnosis not present

## 2014-12-03 DIAGNOSIS — R739 Hyperglycemia, unspecified: Secondary | ICD-10-CM | POA: Diagnosis present

## 2014-12-03 DIAGNOSIS — N189 Chronic kidney disease, unspecified: Secondary | ICD-10-CM | POA: Diagnosis present

## 2014-12-03 DIAGNOSIS — E1165 Type 2 diabetes mellitus with hyperglycemia: Secondary | ICD-10-CM | POA: Diagnosis present

## 2014-12-03 DIAGNOSIS — R609 Edema, unspecified: Secondary | ICD-10-CM

## 2014-12-03 DIAGNOSIS — F1410 Cocaine abuse, uncomplicated: Secondary | ICD-10-CM | POA: Diagnosis present

## 2014-12-03 DIAGNOSIS — F17210 Nicotine dependence, cigarettes, uncomplicated: Secondary | ICD-10-CM | POA: Diagnosis present

## 2014-12-03 DIAGNOSIS — K219 Gastro-esophageal reflux disease without esophagitis: Secondary | ICD-10-CM | POA: Diagnosis present

## 2014-12-03 DIAGNOSIS — F1190 Opioid use, unspecified, uncomplicated: Secondary | ICD-10-CM | POA: Diagnosis present

## 2014-12-03 DIAGNOSIS — D649 Anemia, unspecified: Secondary | ICD-10-CM | POA: Diagnosis present

## 2014-12-03 DIAGNOSIS — L0390 Cellulitis, unspecified: Secondary | ICD-10-CM | POA: Diagnosis present

## 2014-12-03 DIAGNOSIS — Z794 Long term (current) use of insulin: Secondary | ICD-10-CM | POA: Diagnosis not present

## 2014-12-03 DIAGNOSIS — F1910 Other psychoactive substance abuse, uncomplicated: Secondary | ICD-10-CM | POA: Diagnosis present

## 2014-12-03 DIAGNOSIS — Z79899 Other long term (current) drug therapy: Secondary | ICD-10-CM | POA: Diagnosis not present

## 2014-12-03 LAB — BASIC METABOLIC PANEL
Anion gap: 12 (ref 5–15)
BUN: 12 mg/dL (ref 6–20)
CHLORIDE: 94 mmol/L — AB (ref 101–111)
CO2: 22 mmol/L (ref 22–32)
CREATININE: 1.49 mg/dL — AB (ref 0.61–1.24)
Calcium: 8.6 mg/dL — ABNORMAL LOW (ref 8.9–10.3)
GFR calc Af Amer: >60 mL/min (ref >60)
GFR calc non Af Amer: 60 mL/min — ABNORMAL LOW (ref >60)
Glucose, Bld: 533 mg/dL — ABNORMAL HIGH (ref 65–99)
Potassium: 3.9 mmol/L (ref 3.5–5.1)
SODIUM: 128 mmol/L — AB (ref 135–145)

## 2014-12-03 LAB — C-REACTIVE PROTEIN: CRP: 8.7 mg/dL — AB (ref <1.0)

## 2014-12-03 LAB — CBC WITH DIFFERENTIAL/PLATELET
BASOS PCT: 1 %
Basophils Absolute: 0.1 10*3/uL (ref 0.0–0.1)
EOS PCT: 2 %
Eosinophils Absolute: 0.3 10*3/uL (ref 0.0–0.7)
HEMATOCRIT: 33.3 % — AB (ref 39.0–52.0)
HEMOGLOBIN: 11.2 g/dL — AB (ref 13.0–17.0)
LYMPHS PCT: 30 %
Lymphs Abs: 4 10*3/uL (ref 0.7–4.0)
MCH: 28.5 pg (ref 26.0–34.0)
MCHC: 33.6 g/dL (ref 30.0–36.0)
MCV: 84.7 fL (ref 78.0–100.0)
MONOS PCT: 12 %
Monocytes Absolute: 1.6 10*3/uL — ABNORMAL HIGH (ref 0.1–1.0)
NEUTROS ABS: 7.2 10*3/uL (ref 1.7–7.7)
Neutrophils Relative %: 55 %
Platelets: 452 10*3/uL — ABNORMAL HIGH (ref 150–400)
RBC: 3.93 MIL/uL — ABNORMAL LOW (ref 4.22–5.81)
RDW: 13.5 % (ref 11.5–15.5)
WBC: 13.2 10*3/uL — ABNORMAL HIGH (ref 4.0–10.5)

## 2014-12-03 LAB — COMPREHENSIVE METABOLIC PANEL
ALK PHOS: 106 U/L (ref 38–126)
ALT: 13 U/L — AB (ref 17–63)
ANION GAP: 8 (ref 5–15)
AST: 15 U/L (ref 15–41)
Albumin: 2.4 g/dL — ABNORMAL LOW (ref 3.5–5.0)
BILIRUBIN TOTAL: 0.2 mg/dL — AB (ref 0.3–1.2)
BUN: 9 mg/dL (ref 6–20)
CALCIUM: 8.4 mg/dL — AB (ref 8.9–10.3)
CO2: 25 mmol/L (ref 22–32)
CREATININE: 1.36 mg/dL — AB (ref 0.61–1.24)
Chloride: 104 mmol/L (ref 101–111)
GFR calc non Af Amer: >60 mL/min (ref >60)
GLUCOSE: 258 mg/dL — AB (ref 65–99)
Potassium: 3.6 mmol/L (ref 3.5–5.1)
Sodium: 137 mmol/L (ref 135–145)
TOTAL PROTEIN: 5.4 g/dL — AB (ref 6.5–8.1)

## 2014-12-03 LAB — GLUCOSE, CAPILLARY
GLUCOSE-CAPILLARY: 247 mg/dL — AB (ref 65–99)
GLUCOSE-CAPILLARY: 343 mg/dL — AB (ref 65–99)
GLUCOSE-CAPILLARY: 374 mg/dL — AB (ref 65–99)
Glucose-Capillary: 225 mg/dL — ABNORMAL HIGH (ref 65–99)

## 2014-12-03 LAB — CBG MONITORING, ED: Glucose-Capillary: 196 mg/dL — ABNORMAL HIGH (ref 65–99)

## 2014-12-03 LAB — SEDIMENTATION RATE: Sed Rate: 40 mm/hr — ABNORMAL HIGH (ref 0–16)

## 2014-12-03 LAB — CK
Total CK: 69 U/L (ref 49–397)
Total CK: 57 U/L (ref 49–397)

## 2014-12-03 LAB — MRSA PCR SCREENING: MRSA BY PCR: NEGATIVE

## 2014-12-03 MED ORDER — LORAZEPAM 2 MG/ML IJ SOLN
0.5000 mg | INTRAMUSCULAR | Status: DC | PRN
  Administered 2014-12-04 – 2014-12-05 (×6): 0.5 mg via INTRAVENOUS
  Filled 2014-12-03 (×6): qty 1

## 2014-12-03 MED ORDER — ACETAMINOPHEN 325 MG PO TABS
650.0000 mg | ORAL_TABLET | Freq: Four times a day (QID) | ORAL | Status: DC | PRN
  Administered 2014-12-04: 650 mg via ORAL
  Filled 2014-12-03: qty 2

## 2014-12-03 MED ORDER — SODIUM CHLORIDE 0.9 % IJ SOLN
3.0000 mL | Freq: Two times a day (BID) | INTRAMUSCULAR | Status: DC
  Administered 2014-12-04: 3 mL via INTRAVENOUS

## 2014-12-03 MED ORDER — INSULIN ASPART 100 UNIT/ML ~~LOC~~ SOLN: 0.0000 [IU] | Freq: Three times a day (TID) | SUBCUTANEOUS | Status: DC

## 2014-12-03 MED ORDER — PIPERACILLIN-TAZOBACTAM 3.375 G IVPB
3.3750 g | Freq: Three times a day (TID) | INTRAVENOUS | Status: DC
  Administered 2014-12-03 – 2014-12-05 (×7): 3.375 g via INTRAVENOUS
  Filled 2014-12-03 (×10): qty 50

## 2014-12-03 MED ORDER — HEPARIN SODIUM (PORCINE) 5000 UNIT/ML IJ SOLN
5000.0000 [IU] | Freq: Three times a day (TID) | INTRAMUSCULAR | Status: DC
  Filled 2014-12-03 (×5): qty 1

## 2014-12-03 MED ORDER — LISINOPRIL 10 MG PO TABS
10.0000 mg | ORAL_TABLET | Freq: Every day | ORAL | Status: DC
  Administered 2014-12-03 – 2014-12-05 (×3): 10 mg via ORAL
  Filled 2014-12-03 (×3): qty 1

## 2014-12-03 MED ORDER — ONDANSETRON HCL 4 MG PO TABS: 4.0000 mg | ORAL_TABLET | Freq: Four times a day (QID) | ORAL | Status: DC | PRN

## 2014-12-03 MED ORDER — CYCLOBENZAPRINE HCL 10 MG PO TABS
10.0000 mg | ORAL_TABLET | Freq: Three times a day (TID) | ORAL | Status: DC | PRN
  Administered 2014-12-04: 10 mg via ORAL
  Filled 2014-12-03: qty 1

## 2014-12-03 MED ORDER — INSULIN ASPART 100 UNIT/ML ~~LOC~~ SOLN
0.0000 [IU] | Freq: Three times a day (TID) | SUBCUTANEOUS | Status: DC
  Administered 2014-12-03 – 2014-12-04 (×2): 15 [IU] via SUBCUTANEOUS
  Administered 2014-12-04: 11 [IU] via SUBCUTANEOUS
  Administered 2014-12-04 – 2014-12-05 (×2): 15 [IU] via SUBCUTANEOUS
  Administered 2014-12-05: 11 [IU] via SUBCUTANEOUS

## 2014-12-03 MED ORDER — MORPHINE SULFATE (PF) 2 MG/ML IV SOLN
2.0000 mg | INTRAVENOUS | Status: DC | PRN
Start: 2014-12-03 — End: 2014-12-05
  Administered 2014-12-03 – 2014-12-05 (×15): 2 mg via INTRAVENOUS
  Filled 2014-12-03 (×15): qty 1

## 2014-12-03 MED ORDER — ONDANSETRON HCL 4 MG/2ML IJ SOLN
4.0000 mg | Freq: Four times a day (QID) | INTRAMUSCULAR | Status: DC | PRN
  Administered 2014-12-04: 4 mg via INTRAVENOUS
  Filled 2014-12-03: qty 2

## 2014-12-03 MED ORDER — INSULIN ASPART 100 UNIT/ML ~~LOC~~ SOLN: 0.0000 [IU] | SUBCUTANEOUS | Status: DC

## 2014-12-03 MED ORDER — ACETAMINOPHEN 650 MG RE SUPP: 650.0000 mg | Freq: Four times a day (QID) | RECTAL | Status: DC | PRN

## 2014-12-03 MED ORDER — SODIUM CHLORIDE 0.9 % IV SOLN
INTRAVENOUS | Status: DC
  Administered 2014-12-03: 03:00:00 via INTRAVENOUS

## 2014-12-03 MED ORDER — INSULIN ASPART 100 UNIT/ML ~~LOC~~ SOLN
0.0000 [IU] | SUBCUTANEOUS | Status: DC
  Administered 2014-12-03: 11 [IU] via SUBCUTANEOUS
  Administered 2014-12-03: 3 [IU] via SUBCUTANEOUS
  Administered 2014-12-03: 5 [IU] via SUBCUTANEOUS
  Filled 2014-12-03: qty 1

## 2014-12-03 MED ORDER — VANCOMYCIN HCL IN DEXTROSE 1-5 GM/200ML-% IV SOLN
1000.0000 mg | Freq: Three times a day (TID) | INTRAVENOUS | Status: DC
  Administered 2014-12-03 – 2014-12-05 (×6): 1000 mg via INTRAVENOUS
  Filled 2014-12-03 (×10): qty 200

## 2014-12-03 NOTE — Progress Notes (Signed)
Utilization review completed. Bertha Stanfill, RN, BSN. 

## 2014-12-03 NOTE — Progress Notes (Signed)
Patient seen and evaluated earlier the same by my associate. Our [facility]AD venous duplex performed and preliminary reports is negative for deep vein thrombosis.  Please refer to [facility] H[facility]P for details regarding assessment and plan.  Patient evaluated and in no acute distress, alert and awake, no increased work of breathing, no cyanosis, regular rate and rhythm, right lower extremity evaluated with cellulitis at dorsum of the foot. Compartments soft with positive dorsal pedis pulses no cyanosis and good capillary refills. He denies any change in sensation. As such will not consult orthopedic surgery at this juncture.  Will reassess next am. For now continue antibiotic  Luke [name], [place]

## 2014-12-03 NOTE — ED Notes (Signed)
Dr. Patel in to assess pt.

## 2014-12-03 NOTE — H&P (Signed)
[facility] History and Physical  [name]  MRN: [id number]  DOB: [birth year]  DOS: the patient was seen and examined on 12/03/2014 PCP: Minerva Ends, MD  Referring physician: Dr. Alvino Chapel Chief Complaint: Right leg pain and swelling  HPI: Luke [name] is a 34 y.o. male with Past medical history of anxiety, diabetes mellitus uncontrolled, GERD, substance abuse, chronic anemia. The patient is presenting with complaints of right leg pain. He mentions this has been ongoing since last one month and has been progressively worsening. Patient tried some home over the counter medication despite that her ears pain was not improving. And he started having some fever with chills and was feeling generalized fatigue. He was not able to bear any weight for last few days on the right foot and therefore decided to come to the hospital. His pain is primarily located below the knee and it is 7 out of 10 in intensity. 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 Last use was 3 days ago. Patient also has smoking history. Patient has history of diabetes and has ran out of his long-acting insulin a few days ago and recently started again.  The patient is coming from home.  At his baseline ambulates without any support And is independent for most of his ADL; manages his medication on his own.  Review of Systems: as mentioned in the history of present illness.  A comprehensive review of the other systems is negative.  Past Medical History  Diagnosis Date  . Pneumonia   . Headache(784.0)   . Anxiety  Dx 2008  . Diabetes mellitus without complication Dx 8032  . GERD (gastroesophageal reflux disease) Dx 2008  . Anemia   . Seizures 2011     x 2 in lifetime. on Dilantin for a while.   . 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 Irrigation and debridement abscess      Hx: of left  arm abscess related to drug use   . I&d extremity Left 10/11/2012    Procedure: IRRIGATION AND DEBRIDEMENT ABSCESS FOREARM;  Surgeon: Linna Hoff, MD;  Location: [place];  Service: Orthopedics;  Laterality: Left;  . I&d extremity Left 10/12/2012    Procedure: IRRIGATION AND DEBRIDEMENT FOREARM;  Surgeon: Linna Hoff, MD;  Location: [place];  Service: Orthopedics;  Laterality: Left;  . I&d extremity Left 10/14/2012    Procedure: incision and drainage left forearm;  Surgeon: Linna Hoff, MD;  Location: [place];  Service: Orthopedics;  Laterality: Left;  . I&d extremity Left 10/16/2012    Procedure: IRRIGATION AND DEBRIDEMENT LEFT FOREARM;  Surgeon: Linna Hoff, MD;  Location: [place];  Service: Orthopedics;  Laterality: Left;  . I&d extremity Left 10/20/2012    Procedure: INCISION AND DRAINAGE AND DEBRIDEMENT LEFT  FOREARM;  Surgeon: Linna Hoff, MD;  Location: [place];  Service: Orthopedics;  Laterality: Left;   Social History:  reports that he has been smoking Cigarettes.  He has been smoking about 1.00 pack per day. He has never used smokeless tobacco. He reports that he drinks alcohol. He reports that he uses illicit drugs (Cocaine, IV, and Heroin).  No Known Allergies  Family History  Problem Relation Age of Onset  . Diabetes Father   . Heart disease Father   . Mental illness Sister   . Cancer Neg Hx  Prior to Admission medications   Medication Sig Start Date End Date Taking? Authorizing Provider  ACCU-CHEK SOFTCLIX LANCETS lancets 1 each by Other route 3 (three) times daily. 07/23/14  Yes Boykin Nearing, MD  Blood Glucose Monitoring Suppl (BLOOD GLUCOSE METER KIT AND SUPPLIES) Dispense based on patient and insurance preference. Use up to four times daily as directed. (FOR ICD-9 250.00, 250.01). 11/25/13  Yes Donita Brooks, NP  esomeprazole (NEXIUM) 40 MG capsule Take 40 mg by mouth daily before breakfast.   Yes Historical Provider, MD  glucose blood (ACCU-CHEK SMARTVIEW) test strip 1  each by Other route 3 (three) times daily. Use as instructed 07/29/14  Yes Josalyn Funches, MD  insulin aspart (NOVOLOG) 100 UNIT/ML injection Inject 4-16 Units into the skin 3 (three) times daily with meals. 5 units with each meal 250-300 = 4 units 301-350 = 8 units 351-400 = 12 units >450=16 units   Yes Historical Provider, MD  insulin NPH-regular Human (NOVOLIN 70/30) (70-30) 100 UNIT/ML injection Inject 50 Units into the skin 2 (two) times daily with a meal. 11/18/14  Yes Josalyn Funches, MD  Insulin Pen Needle (PEN NEEDLES 3/16") 31G X 5 MM MISC 1 each by Does not apply route once. 07/23/14  Yes Josalyn Funches, MD  Insulin Syringe-Needle U-100 (B-D INS SYRINGE 0.5CC/31GX5/16) 31G X 5/16" 0.5 ML MISC 1 each by Does not apply route 3 (three) times daily. 07/23/14  Yes Josalyn Funches, MD  Insulin Syringes, Disposable, U-100 0.5 ML MISC 1 each by Does not apply route 3 (three) times daily as needed. 11/25/13  Yes Donita Brooks, NP  Lancet Devices [facility]) lancets 1 each by Other route 3 (three) times daily. 07/23/14  Yes Josalyn Funches, MD  lisinopril (PRINIVIL,ZESTRIL) 10 MG tablet Take 1 tablet (10 mg total) by mouth daily. 07/23/14  Yes Boykin Nearing, MD    Physical Exam: Filed Vitals:   12/03/14 0421 12/03/14 0445 12/03/14 0515 12/03/14 0530  BP: 117/70 126/60 116/54 119/54  Pulse: 102 99 99 101  Temp:      TempSrc:      Resp: _0 Height:      Weight:      SpO2: 100% 100% 98% 99%    General: Alert, Awake and Oriented to Time, Place and Person. Appear in mild distress Eyes: PERRL ENT: Oral Mucosa clear moist. Neck: no JVD Cardiovascular: S1 and S2 Present, no Murmur, Peripheral Pulses Present Respiratory: Bilateral Air entry equal and Decreased,  Clear to Auscultation, no Crackles, no wheezes Abdomen: Bowel Sound present, Soft and no tenderness Skin: no Rash Extremities: Bilateral right more than left Pedal edema, right calf tenderness Neurologic: Grossly no  focal neuro deficit.  Labs on Admission:  CBC:  Recent Labs Lab 12/02/14 2200  WBC 14.9*  NEUTROABS 9.4*  HGB 13.0  HCT 39.2  MCV 85.6  PLT 509*    CMP     Component Value Date/Time   NA 128* 12/03/2014 0150   NA 138 04/21/2014 0745   K 3.9 12/03/2014 0150   K 4.7 04/21/2014 1453   CL 94* 12/03/2014 0150   CL 108* 04/21/2014 0745   CO2 22 12/03/2014 0150   CO2 17* 04/21/2014 0745   GLUCOSE 533* 12/03/2014 0150   GLUCOSE 295* 04/21/2014 0745   BUN 12 12/03/2014 0150   BUN 23* 04/21/2014 0745   CREATININE 1.49* 12/03/2014 0150   CREATININE 1.31 07/23/2014 1225   CREATININE 1.05 04/21/2014 0745   CALCIUM 8.6* 12/03/2014  0150   CALCIUM 9.2 04/21/2014 0745   PROT 7.3 12/02/2014 2200   PROT 6.7 04/19/2014 0203   ALBUMIN 3.2* 12/02/2014 2200   ALBUMIN 3.6 04/19/2014 0203   AST 26 12/02/2014 2200   AST 30 04/19/2014 0203   ALT 17 12/02/2014 2200   ALT 88* 04/19/2014 0203   ALKPHOS 133* 12/02/2014 2200   ALKPHOS 179* 04/19/2014 0203   BILITOT 0.2* 12/02/2014 2200   BILITOT < 0.1* 04/19/2014 0203   GFRNONAA 60* 12/03/2014 0150   GFRNONAA 71 07/23/2014 1225   GFRNONAA >60 04/21/2014 0745   GFRAA >60 12/03/2014 0150   GFRAA 82 07/23/2014 1225   GFRAA >60 04/21/2014 0745     Recent Labs Lab 12/03/14 0150  CKTOTAL 69   BNP (last 3 results) No results for input(s): BNP in the last 8760 hours.  ProBNP (last 3 results) No results for input(s): PROBNP in the last 8760 hours.   Radiological Exams on Admission: Dg Tibia/fibula Right  12/03/2014   CLINICAL DATA:  Redness, swelling and pain right lower leg. Right foot wound.  EXAM: RIGHT TIBIA AND FIBULA - 2 VIEW  COMPARISON:  None.  FINDINGS: There is no evidence of fracture or other focal bone lesions. Soft tissues are unremarkable.  IMPRESSION: Negative.   Electronically Signed   By: Marin Olp M.D.   On: 12/03/2014 00:19   Dg Ankle Complete Right  12/03/2014   CLINICAL DATA:  Cellulitis, type 1 diabetic.   EXAM: RIGHT ANKLE - COMPLETE 3+ VIEW; RIGHT FOOT COMPLETE - 3+ VIEW  COMPARISON:  None.  FINDINGS: No fracture deformity nor dislocation. Old lateral malleolus avulsion injury. The ankle mortise appears congruent and the tibiofibular syndesmosis intact. No destructive bony lesions. Medial hindfoot soft tissue swelling without subcutaneous gas or radiopaque foreign bodies. Small probable ulceration medial ankle soft tissues.  IMPRESSION: Soft tissue swelling compatible with cellulitis with small probable ulcer, medial ankle.  No acute osseous process.   Electronically Signed   By: Elon Alas M.D.   On: 12/03/2014 00:22   Dg Foot Complete Right  12/03/2014   CLINICAL DATA:  Cellulitis, type 1 diabetic.  EXAM: RIGHT ANKLE - COMPLETE 3+ VIEW; RIGHT FOOT COMPLETE - 3+ VIEW  COMPARISON:  None.  FINDINGS: No fracture deformity nor dislocation. Old lateral malleolus avulsion injury. The ankle mortise appears congruent and the tibiofibular syndesmosis intact. No destructive bony lesions. Medial hindfoot soft tissue swelling without subcutaneous gas or radiopaque foreign bodies. Small probable ulceration medial ankle soft tissues.  IMPRESSION: Soft tissue swelling compatible with cellulitis with small probable ulcer, medial ankle.  No acute osseous process.   Electronically Signed   By: Elon Alas M.D.   On: 12/03/2014 00:22    Assessment/Plan 1. Cellulitis The patient is presenting with complaints of right leg swelling as well as pain. He is found to have cellulitis of the right leg. His right leg is swollen and the patient has significant pain with movement of the leg although compartments are soft. CK is not elevated. X-ray does not show any gas. Pulses are palpable. With this the patient will be closely monitored in the stepdown unit. I would recheck CK in the morning. Treat him with IV vancomycin and Zosyn. Check pulse every shift. Lower extremity Doppler in the morning. The patient's  pain does not improve with routine treatment recommended orthopedic consultation. Keeping the patient nothing by mouth except ice chips ounces of water.  2. Drug abuse, IV When necessary benzodiazepine for anxiety as  well as agitation. Avoid beta blockers in the setting of cocaine use.  3. DM type 2, uncontrolled, with renal complications With hyperglycemia. The patient is significantly hyperglycemic on arrival. Next and the patient has received 15 years of subcutaneous insulin. At present I would continue every 4 hours insulin. Monitor BMP every 4 hours as well. 2 BMP does not show patient is in DKA. We will continue to closely monitor.  4.HTN (hypertension) Holding lisinopril and the setting of acute on chronic kidney disease.  5. Hyponatremia Most likely pseudohyponatremia in the setting of hyperglycemia. We will continue to closely monitor.  6. Acute on chronic kidney disease. Most likely in the setting of ongoing infection. Monitor BMP. Aggressive IV hydration.  Nutrition: Nothing by mouth except medications DVT Prophylaxis: subcutaneous Heparin Advance goals of care discussion: Full code   Disposition: Admitted as inpatient, step-down unit.  Author: Berle Mull, MD [facility] Pager: [phone] 12/03/2014  If 7PM-7AM, please contact night-coverage [email or web address] Password [email or web address]

## 2014-12-03 NOTE — Progress Notes (Signed)
ANTIBIOTIC CONSULT NOTE - INITIAL  Pharmacy Consult for Vancomycin and Zosyn  Indication: cellulitis  No Known Allergies  Patient Measurements: Height: 5\' 9"  (175.3 cm) Weight: 179 lb 10.8 oz (81.5 kg) IBW/kg (Calculated) : 70.7  Vital Signs: Temp: 98.5 F (36.9 C) (09/16 0622) Temp Source: Oral (09/16 0622) BP: 139/69 mmHg (09/16 0622) Pulse Rate: 95 (09/16 0622) Intake/Output from previous day: 09/15 0701 - 09/16 0700 In: 1200 [I.V.:1200] Out: 900 [Urine:900] Intake/Output from this shift: Total I/O In: 1200 [I.V.:1200] Out: 900 [Urine:900]  Labs:  Recent Labs  12/02/14 2200 12/03/14 0150  WBC 14.9*  --   HGB 13.0  --   PLT 509*  --   CREATININE 1.67* 1.49*   Estimated Creatinine Clearance: 69.9 mL/min (by C-G formula based on Cr of 1.49). No results for input(s): VANCOTROUGH, VANCOPEAK, VANCORANDOM, GENTTROUGH, GENTPEAK, GENTRANDOM, TOBRATROUGH, TOBRAPEAK, TOBRARND, AMIKACINPEAK, AMIKACINTROU, AMIKACIN in the last 72 hours.   Microbiology: No results found for this or any previous visit (from the past 720 hour(s)).  Medical History: Past Medical History  Diagnosis Date  . Pneumonia   . Headache(784.0)   . Anxiety  Dx 2008  . Diabetes mellitus without complication Dx 3300  . GERD (gastroesophageal reflux disease) Dx 2008  . Anemia   . Seizures 2011     x 2 in lifetime. on Dilantin for a while.   . 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 ACCU-CHEK SOFTCLIX LANCETS lancets 1 each by Other route 3 (three) times daily. 100 each 12   . Blood Glucose Monitoring Suppl (BLOOD GLUCOSE METER KIT AND SUPPLIES) Dispense based on patient and insurance preference. Use up to four times daily as directed. (FOR ICD-9 250.00, 250.01). 1 each 1 Taking  . esomeprazole (NEXIUM) 40 MG capsule Take 40 mg by mouth daily before breakfast.   Past Month at Unknown time   . glucose blood (ACCU-CHEK SMARTVIEW) test strip 1 each by Other route 3 (three) times daily. Use as instructed 100 each 12   . insulin aspart (NOVOLOG) 100 UNIT/ML injection Inject 4-16 Units into the skin 3 (three) times daily with meals. 5 units with each meal 250-300 = 4 units 301-350 = 8 units 351-400 = 12 units >450=16 units   12/02/2014 at Unknown time  . insulin NPH-regular Human (NOVOLIN 70/30) (70-30) 100 UNIT/ML injection Inject 50 Units into the skin 2 (two) times daily with a meal. 30 mL 5 12/02/2014 at Unknown time  . Insulin Pen Needle (PEN NEEDLES 3/16") 31G X 5 MM MISC 1 each by Does not apply route once. 30 each 11   . Insulin Syringe-Needle U-100 (B-D INS SYRINGE 0.5CC/31GX5/16) 31G X 5/16" 0.5 ML MISC 1 each by Does not apply route 3 (three) times daily. 90 each 3   . Insulin Syringes, Disposable, U-100 0.5 ML MISC 1 each by Does not apply route 3 (three) times daily as needed. 180 each 1 Taking  . Lancet Devices (ACCU-CHEK SOFTCLIX) lancets 1 each by Other route 3 (three) times daily. 1 each 0   . lisinopril (PRINIVIL,ZESTRIL) 10 MG tablet Take 1 tablet (10 mg total) by mouth daily. 30 tablet 5 12/02/2014 at Unknown time   Assessment: 34 y.o. male with RLE swelling/cellulitis for empiric antibiotics.  Vancomycin 1 g IV given in ED at  0600    Goal of Therapy:  Vancomycin trough level 10-15  mcg/ml  Plan:  .Vancomycin 1 g IV q8h Zosyn 3.375 g IV q8h   Caryl Pina 12/03/2014,6:31 AM

## 2014-12-03 NOTE — Progress Notes (Signed)
*  Preliminary Results* Right lower extremity venous duplex completed. Right lower extremity is negative for deep vein thrombosis. There is no evidence of right Baker's cyst.  Incidental finding: There are multiple heterogenous areas of the right groin, suggestive of possible enlarged inguinal lymph nodes.  Bilateral lower extremity venous duplex was ordered, however patient refused examination of the left lower extremity, stated his symptoms were only in the right.  12/03/2014 10:28 AM  Maudry Mayhew, RVT, RDCS, RDMS

## 2014-12-03 NOTE — Progress Notes (Addendum)
Inpatient Diabetes Program Recommendations  AACE/ADA: New Consensus Statement on Inpatient Glycemic Control (2015)  Target Ranges:  Prepandial:   less than 140 mg/dL      Peak postprandial:   less than 180 mg/dL (1-2 hours)      Critically ill patients:  140 - 180 mg/dL    Review of Glycemic Control  Diabetes history: DM 1 per Dr. Gabriel Carina (Endocrinologist at [facility]) Outpatient Diabetes medications: 70/30 50 units BID, Novolog 4-6 units TID with meals Current orders for Inpatient glycemic control: Novolog moderate TID  Inpatient Diabetes Program Recommendations:  Insulin - Basal: Patient takes 70/30 50 units BID at home. Patient received 50 units 1 time dose last pm. Glucose this am 247 mg/dl. Patient will require some type of basal insulin. Please consider reordering and slightly increasing 70/30 to 55 units BID.  Thanks,  Tama Headings RN, MSN, [facility] Inpatient Diabetes Coordinator Team Pager [phone] (8a-5p)

## 2014-12-03 NOTE — ED Notes (Signed)
Attempt to call report to floor x1. 

## 2014-12-04 DIAGNOSIS — R739 Hyperglycemia, unspecified: Secondary | ICD-10-CM

## 2014-12-04 DIAGNOSIS — F1910 Other psychoactive substance abuse, uncomplicated: Secondary | ICD-10-CM

## 2014-12-04 DIAGNOSIS — E871 Hypo-osmolality and hyponatremia: Secondary | ICD-10-CM

## 2014-12-04 DIAGNOSIS — I10 Essential (primary) hypertension: Secondary | ICD-10-CM

## 2014-12-04 DIAGNOSIS — N179 Acute kidney failure, unspecified: Secondary | ICD-10-CM

## 2014-12-04 LAB — GLUCOSE, CAPILLARY
GLUCOSE-CAPILLARY: 509 mg/dL — AB (ref 65–99)
GLUCOSE-CAPILLARY: 564 mg/dL — AB (ref 65–99)
Glucose-Capillary: 538 mg/dL — ABNORMAL HIGH (ref 65–99)
Glucose-Capillary: 503 mg/dL — ABNORMAL HIGH (ref 65–99)
Glucose-Capillary: 322 mg/dL — ABNORMAL HIGH (ref 65–99)
Glucose-Capillary: 195 mg/dL — ABNORMAL HIGH (ref 65–99)

## 2014-12-04 LAB — HEMOGLOBIN A1C
Hgb A1c MFr Bld: 10.6 % — ABNORMAL HIGH (ref 4.8–5.6)
MEAN PLASMA GLUCOSE: 258 mg/dL

## 2014-12-04 MED ORDER — INSULIN ASPART PROT & ASPART (70-30 MIX) 100 UNIT/ML ~~LOC~~ SUSP
55.0000 [IU] | Freq: Two times a day (BID) | SUBCUTANEOUS | Status: DC
  Administered 2014-12-04 – 2014-12-05 (×3): 55 [IU] via SUBCUTANEOUS
  Filled 2014-12-04: qty 10

## 2014-12-04 NOTE — Progress Notes (Signed)
Recheck of CBG is  509, 15 units Novolog given.  Pt states he takes 50 units am and pm of 70/30 insulin at home.

## 2014-12-04 NOTE — Progress Notes (Signed)
[facility] PROGRESS NOTE  Luke [name] [id number] DOB: June 22, 1980 DOA: 12/02/2014 PCP: Minerva Ends, MD  Assessment/Plan: Principal Problem:   Cellulitis - continue current antibiotic regimen listed below. - doppler of lower extremity pending.  Active Problems:   Drug abuse, IV - counseled on quiting. He wishes to know what his options are - consult social worker    Tobacco abuse - recommend cessation    DM type 2, uncontrolled, with renal complications and hyperglycemia - Place patient on his home novolog regimen - Pt is on SSI    HTN (hypertension) - pt is currently on lisinopril - stable    H/O: substance abuse - consulted social worker    Hyponatremia - resolved with IVF's - Most likely secondary to poor oral solute intake    AKI (acute kidney injury) - Serum creatinine currently trending down. I suspect patient may have had low intravascular volume which has resolved with IV fluid rehydration   Code Status: Full Family Communication: Discussed directly with patient Disposition Plan: DC home with the next one or 2 days with continued improvement   Consultants:  None  Procedures:  None  Antibiotics:  Piperacillin tazobactam   Vancomycin  HPI/Subjective: The patient states that he feels better. Still has some discomfort at right lower extremity but much improved from initial presentation  Objective: Filed Vitals:   12/04/14 1354  BP: 144/69  Pulse: 98  Temp: 99.6 F (37.6 C)  Resp: 18    Intake/Output Summary (Last 24 hours) at 12/04/14 1735 Last data filed at 12/04/14 1710  Gross per 24 hour  Intake   1720 ml  Output   3150 ml  Net  -1430 ml   Filed Weights   12/02/14 1848 12/03/14 0622 12/04/14 0500  Weight: 83.915 kg (185 lb) 81.5 kg (179 lb 10.8 oz) 81.2 kg (179 lb 0.2 oz)    Exam:   General:  Pt in nad, alert and awake  Cardiovascular: rrr, no mrg  Respiratory: cta bl, no wheezes  Abdomen: soft, NT,  ND  Musculoskeletal: no cyanosis or clubbing. Pain with palpation over right foot. No purulent discharge  Skin: cellulitis over dorsum of right foot, +Calor when compared to left, no cyanosis, good capillary refill    Data Reviewed: Basic Metabolic Panel:  Recent Labs Lab 12/02/14 2200 12/03/14 0150 12/03/14 0750  NA 129* 128* 137  K 4.4 3.9 3.6  CL 91* 94* 104  CO2 25 22 25   GLUCOSE 800* 533* 258*  BUN 12 12 9   CREATININE 1.67* 1.49* 1.36*  CALCIUM 9.2 8.6* 8.4*   Liver Function Tests:  Recent Labs Lab 12/02/14 2200 12/03/14 0750  AST 26 15  ALT 17 13*  ALKPHOS 133* 106  BILITOT 0.2* 0.2*  PROT 7.3 5.4*  ALBUMIN 3.2* 2.4*   No results for input(s): LIPASE, AMYLASE in the last 168 hours. No results for input(s): AMMONIA in the last 168 hours. CBC:  Recent Labs Lab 12/02/14 2200 12/03/14 0750  WBC 14.9* 13.2*  NEUTROABS 9.4* 7.2  HGB 13.0 11.2*  HCT 39.2 33.3*  MCV 85.6 84.7  PLT 509* 452*   Cardiac Enzymes:  Recent Labs Lab 12/03/14 0150 12/03/14 0750  CKTOTAL 69 57   BNP (last 3 results) No results for input(s): BNP in the last 8760 hours.  ProBNP (last 3 results) No results for input(s): PROBNP in the last 8760 hours.  CBG:  Recent Labs Lab 12/04/14 [id number] 12/04/14 0814 12/04/14 1020 12/04/14 1227 12/04/14 1657  [facility]*    Recent Results (from the past 240 hour(s))  Culture, blood (routine x 2)     Status: None (Preliminary result)   Collection Time: 12/02/14 10:50 PM  Result Value Ref Range Status   Specimen Description BLOOD RIGHT FOREARM  Final   Special Requests BOTTLES DRAWN AEROBIC AND ANAEROBIC 5CC  Final   Culture NO GROWTH 2 DAYS  Final   Report Status PENDING  Incomplete  Culture, blood (routine x 2)     Status: None (Preliminary result)   Collection Time: 12/02/14 10:54 PM  Result Value Ref Range Status   Specimen Description BLOOD LEFT FINGER  Final   Special Requests BOTTLES DRAWN AEROBIC AND  ANAEROBIC 3CC  Final   Culture NO GROWTH 2 DAYS  Final   Report Status PENDING  Incomplete  MRSA PCR Screening     Status: None   Collection Time: 12/03/14  6:15 AM  Result Value Ref Range Status   MRSA by PCR NEGATIVE NEGATIVE Final    Comment:        The GeneXpert MRSA Assay (FDA approved for NASAL specimens only), is one component of a comprehensive MRSA colonization surveillance program. It is not intended to diagnose MRSA infection nor to guide or monitor treatment for MRSA infections.      Studies: Dg Tibia/fibula Right  12/03/2014   CLINICAL DATA:  Redness, swelling and pain right lower leg. Right foot wound.  EXAM: RIGHT TIBIA AND FIBULA - 2 VIEW  COMPARISON:  None.  FINDINGS: There is no evidence of fracture or other focal bone lesions. Soft tissues are unremarkable.  IMPRESSION: Negative.   Electronically Signed   By: Marin Olp M.D.   On: 12/03/2014 00:19   Dg Ankle Complete Right  12/03/2014   CLINICAL DATA:  Cellulitis, type 1 diabetic.  EXAM: RIGHT ANKLE - COMPLETE 3+ VIEW; RIGHT FOOT COMPLETE - 3+ VIEW  COMPARISON:  None.  FINDINGS: No fracture deformity nor dislocation. Old lateral malleolus avulsion injury. The ankle mortise appears congruent and the tibiofibular syndesmosis intact. No destructive bony lesions. Medial hindfoot soft tissue swelling without subcutaneous gas or radiopaque foreign bodies. Small probable ulceration medial ankle soft tissues.  IMPRESSION: Soft tissue swelling compatible with cellulitis with small probable ulcer, medial ankle.  No acute osseous process.   Electronically Signed   By: Elon Alas M.D.   On: 12/03/2014 00:22   Dg Foot Complete Right  12/03/2014   CLINICAL DATA:  Cellulitis, type 1 diabetic.  EXAM: RIGHT ANKLE - COMPLETE 3+ VIEW; RIGHT FOOT COMPLETE - 3+ VIEW  COMPARISON:  None.  FINDINGS: No fracture deformity nor dislocation. Old lateral malleolus avulsion injury. The ankle mortise appears congruent and the tibiofibular  syndesmosis intact. No destructive bony lesions. Medial hindfoot soft tissue swelling without subcutaneous gas or radiopaque foreign bodies. Small probable ulceration medial ankle soft tissues.  IMPRESSION: Soft tissue swelling compatible with cellulitis with small probable ulcer, medial ankle.  No acute osseous process.   Electronically Signed   By: Elon Alas M.D.   On: 12/03/2014 00:22    Scheduled Meds: . heparin  5,000 Units Subcutaneous 3 times per day  . insulin aspart  0-15 Units Subcutaneous TID WC  . insulin aspart protamine- aspart  55 Units Subcutaneous BID WC  . lisinopril  10 mg Oral Daily  . piperacillin-tazobactam (ZOSYN)  IV  3.375 g Intravenous 3 times per day  . sodium chloride  3 mL Intravenous Q12H  .  vancomycin  1,000 mg Intravenous 3 times per day   Continuous Infusions:    Time spent: > 20 minutes   Velvet Bathe  [facility] Pager [phone]. If 7PM-7AM, please contact night-coverage at [email or web address], password [facility] 12/04/2014, 5:35 PM  LOS: 1 day

## 2014-12-04 NOTE — Progress Notes (Signed)
Called and s/w Pharmacist regarding Vancomycin since lab was unable to draw Vanc trough.  She stated to go ahead and give the Vanc.

## 2014-12-04 NOTE — Progress Notes (Signed)
Informed Dr. Wendee Beavers of pt's CBG this morning.  Ordered to recheck it manually now and if greater than 400, give the 15 units as ordered on the sliding scale and recheck in 1.5 hours.

## 2014-12-04 NOTE — Progress Notes (Signed)
CBG this am is 564.  Dr. Wendee Beavers texted.

## 2014-12-05 DIAGNOSIS — Z87898 Personal history of other specified conditions: Secondary | ICD-10-CM

## 2014-12-05 LAB — GLUCOSE, CAPILLARY
GLUCOSE-CAPILLARY: 393 mg/dL — AB (ref 65–99)
GLUCOSE-CAPILLARY: 324 mg/dL — AB (ref 65–99)

## 2014-12-05 MED ORDER — AMOXICILLIN-POT CLAVULANATE 875-125 MG PO TABS: 1.0000 | ORAL_TABLET | Freq: Two times a day (BID) | ORAL | Status: DC

## 2014-12-05 MED ORDER — ACETAMINOPHEN 325 MG PO TABS: 650.0000 mg | ORAL_TABLET | Freq: Four times a day (QID) | ORAL | Status: DC | PRN

## 2014-12-05 MED ORDER — DOXYCYCLINE HYCLATE 100 MG PO TABS: 100.0000 mg | ORAL_TABLET | Freq: Two times a day (BID) | ORAL | Status: DC

## 2014-12-05 NOTE — Progress Notes (Signed)
Pt ready for DC, RWalker brought in for pt use at home.

## 2014-12-05 NOTE — Discharge Summary (Signed)
Physician Discharge Summary  Luke [name] RAX:[id number] DOB: 1980-06-03 DOA: 12/02/2014  PCP: Minerva Ends, MD  Admit date: 12/02/2014 Discharge date: 12/05/2014  Time spent: > 35 minutes  Recommendations for Outpatient Follow-up:  1. Please be sure to reassess serum creatinine levels  Discharge Diagnoses:  Principal Problem:   Cellulitis Active Problems:   Drug abuse, IV   Tobacco abuse   DM type 2, uncontrolled, with renal complications   HTN (hypertension)   H/O: substance abuse   Hyponatremia   AKI (acute kidney injury)   Hyperglycemia   Left leg pain   Cellulitis of right lower extremity   Discharge Condition: stable  Diet recommendation: diabetic diet  Filed Weights   12/03/14 0622 12/04/14 0500 12/05/14 0610  Weight: 81.5 kg (179 lb 10.8 oz) 81.2 kg (179 lb 0.2 oz) 81.9 kg (180 lb 8.9 oz)    History of present illness:  34 year old male with past medical history of anxiety, history of IV drug abuse and uses cocaine IV, diabetes mellitus uncontrolled, GERD, substance abuse who presented with right lower extremity discomfort and diagnosed with cellulitis.  Hospital Course:   Cellulitis - Improved on vancomycin and Zosyn. Will discharge on doxycycline and Augmentin. -Patient can take acetaminophen for discomfort. Given history of IV drug use we'll not discharge on opiates  Active Problems:  Drug abuse, IV - counseled on quiting. Patient reports that he is going to quit.   Tobacco abuse - recommended cessation   DM type 2, uncontrolled, with renal complications and hyperglycemia - We'll continue patient's home medication regimen and increase his NovoLog on discharge to 55 units subcutaneous twice a day   HTN (hypertension) - pt is currently on lisinopril - stable   H/O: substance abuse - consulted social worker   Hyponatremia - resolved with IVF's - Most likely secondary to poor oral solute intake   AKI (acute kidney injury) - improved  and near baseline. I suspect patient may have had low intravascular volume which has resolved with IV fluid rehydration  Procedures:  None  Consultations:  none  Discharge Exam: Filed Vitals:   12/04/14 2115  BP: 115/52  Pulse: 84  Temp: 98.7 F (37.1 C)  Resp: 18    General: Pt in nad, alert and awake Cardiovascular: rrr, no mrg Respiratory: cta bl, no wheezes Skin: RLE cellulitis  Discharge Instructions   Discharge Instructions    Call MD for:  difficulty breathing, headache or visual disturbances    Complete by:  As directed      Call MD for:  severe uncontrolled pain    Complete by:  As directed      Call MD for:  temperature >100.4    Complete by:  As directed      Diet - low sodium heart healthy    Complete by:  As directed      Discharge instructions    Complete by:  As directed   Please be sure to follow up with your primary care physician for further evaluation and recommendations.     Increase activity slowly    Complete by:  As directed           Current Discharge Medication List    START taking these medications   Details  acetaminophen (TYLENOL) 325 MG tablet Take 2 tablets (650 mg total) by mouth every 6 (six) hours as needed for mild pain (or Fever >/= 101).    amoxicillin-clavulanate (AUGMENTIN) 875-125 MG per tablet Take 1 tablet by  mouth every 12 (twelve) hours. Qty: 14 tablet, Refills: 0    doxycycline (VIBRA-TABS) 100 MG tablet Take 1 tablet (100 mg total) by mouth 2 (two) times daily. Qty: 14 tablet, Refills: 0      CONTINUE these medications which have NOT CHANGED   Details  ACCU-CHEK SOFTCLIX LANCETS lancets 1 each by Other route 3 (three) times daily. Qty: 100 each, Refills: 12   Associated Diagnoses: DM type 2, uncontrolled, with renal complications    Blood Glucose Monitoring Suppl (BLOOD GLUCOSE METER KIT AND SUPPLIES) Dispense based on patient and insurance preference. Use up to four times daily as directed. (FOR ICD-9  250.00, 250.01). Qty: 1 each, Refills: 1    esomeprazole (NEXIUM) 40 MG capsule Take 40 mg by mouth daily before breakfast.    glucose blood (ACCU-CHEK SMARTVIEW) test strip 1 each by Other route 3 (three) times daily. Use as instructed Qty: 100 each, Refills: 12    insulin aspart (NOVOLOG) 100 UNIT/ML injection Inject 4-16 Units into the skin 3 (three) times daily with meals. 5 units with each meal 250-300 = 4 units 301-350 = 8 units 351-400 = 12 units >450=16 units    insulin NPH-regular Human (NOVOLIN 70/30) (70-30) 100 UNIT/ML injection Inject 50 Units into the skin 2 (two) times daily with a meal. Qty: 30 mL, Refills: 5   Associated Diagnoses: DM type 2, uncontrolled, with renal complications    Insulin Pen Needle (PEN NEEDLES 3/16") 31G X 5 MM MISC 1 each by Does not apply route once. Qty: 30 each, Refills: 11   Associated Diagnoses: DM type 2, uncontrolled, with renal complications    Insulin Syringe-Needle U-100 (B-D INS SYRINGE 0.5CC/31GX5/16) 31G X 5/16" 0.5 ML MISC 1 each by Does not apply route 3 (three) times daily. Qty: 90 each, Refills: 3   Associated Diagnoses: DM type 2, uncontrolled, with renal complications    Insulin Syringes, Disposable, U-100 0.5 ML MISC 1 each by Does not apply route 3 (three) times daily as needed. Qty: 180 each, Refills: 1    Lancet Devices (ACCU-CHEK SOFTCLIX) lancets 1 each by Other route 3 (three) times daily. Qty: 1 each, Refills: 0   Associated Diagnoses: DM type 2, uncontrolled, with renal complications    lisinopril (PRINIVIL,ZESTRIL) 10 MG tablet Take 1 tablet (10 mg total) by mouth daily. Qty: 30 tablet, Refills: 5   Associated Diagnoses: Essential hypertension       No Known Allergies    The results of significant diagnostics from this hospitalization (including imaging, microbiology, ancillary and laboratory) are listed below for reference.    Significant Diagnostic Studies: Dg Tibia/fibula Right  12/03/2014    CLINICAL DATA:  Redness, swelling and pain right lower leg. Right foot wound.  EXAM: RIGHT TIBIA AND FIBULA - 2 VIEW  COMPARISON:  None.  FINDINGS: There is no evidence of fracture or other focal bone lesions. Soft tissues are unremarkable.  IMPRESSION: Negative.   Electronically Signed   By: Marin Olp M.D.   On: 12/03/2014 00:19   Dg Ankle Complete Right  12/03/2014   CLINICAL DATA:  Cellulitis, type 1 diabetic.  EXAM: RIGHT ANKLE - COMPLETE 3+ VIEW; RIGHT FOOT COMPLETE - 3+ VIEW  COMPARISON:  None.  FINDINGS: No fracture deformity nor dislocation. Old lateral malleolus avulsion injury. The ankle mortise appears congruent and the tibiofibular syndesmosis intact. No destructive bony lesions. Medial hindfoot soft tissue swelling without subcutaneous gas or radiopaque foreign bodies. Small probable ulceration medial ankle soft tissues.  IMPRESSION:  Soft tissue swelling compatible with cellulitis with small probable ulcer, medial ankle.  No acute osseous process.   Electronically Signed   By: Elon Alas M.D.   On: 12/03/2014 00:22   Dg Foot Complete Right  12/03/2014   CLINICAL DATA:  Cellulitis, type 1 diabetic.  EXAM: RIGHT ANKLE - COMPLETE 3+ VIEW; RIGHT FOOT COMPLETE - 3+ VIEW  COMPARISON:  None.  FINDINGS: No fracture deformity nor dislocation. Old lateral malleolus avulsion injury. The ankle mortise appears congruent and the tibiofibular syndesmosis intact. No destructive bony lesions. Medial hindfoot soft tissue swelling without subcutaneous gas or radiopaque foreign bodies. Small probable ulceration medial ankle soft tissues.  IMPRESSION: Soft tissue swelling compatible with cellulitis with small probable ulcer, medial ankle.  No acute osseous process.   Electronically Signed   By: Elon Alas M.D.   On: 12/03/2014 00:22    Microbiology: Recent Results (from the past 240 hour(s))  Culture, blood (routine x 2)     Status: None (Preliminary result)   Collection Time: 12/02/14 10:50 PM   Result Value Ref Range Status   Specimen Description BLOOD RIGHT FOREARM  Final   Special Requests BOTTLES DRAWN AEROBIC AND ANAEROBIC 5CC  Final   Culture NO GROWTH 3 DAYS  Final   Report Status PENDING  Incomplete  Culture, blood (routine x 2)     Status: None (Preliminary result)   Collection Time: 12/02/14 10:54 PM  Result Value Ref Range Status   Specimen Description BLOOD LEFT FINGER  Final   Special Requests BOTTLES DRAWN AEROBIC AND ANAEROBIC 3CC  Final   Culture NO GROWTH 3 DAYS  Final   Report Status PENDING  Incomplete  MRSA PCR Screening     Status: None   Collection Time: 12/03/14  6:15 AM  Result Value Ref Range Status   MRSA by PCR NEGATIVE NEGATIVE Final    Comment:        The GeneXpert MRSA Assay (FDA approved for NASAL specimens only), is one component of a comprehensive MRSA colonization surveillance program. It is not intended to diagnose MRSA infection nor to guide or monitor treatment for MRSA infections.      Labs: Basic Metabolic Panel:  Recent Labs Lab 12/02/14 2200 12/03/14 0150 12/03/14 0750  NA 129* 128* 137  K 4.4 3.9 3.6  CL 91* 94* 104  CO2 $Re'25 22 25  'Jmf$ GLUCOSE 800* 533* 258*  BUN $Re'12 12 9  'bTq$ CREATININE 1.67* 1.49* 1.36*  CALCIUM 9.2 8.6* 8.4*   Liver Function Tests:  Recent Labs Lab 12/02/14 2200 12/03/14 0750  AST 26 15  ALT 17 13*  ALKPHOS 133* 106  BILITOT 0.2* 0.2*  PROT 7.3 5.4*  ALBUMIN 3.2* 2.4*   No results for input(s): LIPASE, AMYLASE in the last 168 hours. No results for input(s): AMMONIA in the last 168 hours. CBC:  Recent Labs Lab 12/02/14 2200 12/03/14 0750  WBC 14.9* 13.2*  NEUTROABS 9.4* 7.2  HGB 13.0 11.2*  HCT 39.2 33.3*  MCV 85.6 84.7  PLT 509* 452*   Cardiac Enzymes:  Recent Labs Lab 12/03/14 0150 12/03/14 0750  CKTOTAL 69 57   BNP: BNP (last 3 results) No results for input(s): BNP in the last 8760 hours.  ProBNP (last 3 results) No results for input(s): PROBNP in the last 8760  hours.  CBG:  Recent Labs Lab 12/04/14 1227 12/04/14 1657 12/04/14 2145 12/05/14 0752 12/05/14 1154  GLUCAP 503* 322* 195* 393* 324*    Signed:  Velvet Bathe  [facility] 12/05/2014, 2:58 PM

## 2014-12-05 NOTE — Progress Notes (Signed)
Reviewed DC instructions and pt given copy.  Reviewed with wife and pt.  Pt has Rolling walker for home use.  Pt requested wheelchair for discharge.  Went to go get one and pt and wife left on their own.

## 2014-12-07 LAB — CULTURE, BLOOD (ROUTINE X 2)
Culture: NO GROWTH
Culture: NO GROWTH

## 2015-04-15 MED FILL — NOVOLIN 70/30 100 UNITS/ML: (70-30) 100 | 30 days supply | Qty: 30 | Fill #4

## 2015-04-18 ENCOUNTER — Other Ambulatory Visit: Payer: Self-pay | Admitting: *Deleted

## 2015-04-18 MED ORDER — INSULIN NPH ISOPHANE & REGULAR (70-30) 100 UNIT/ML ~~LOC~~ SUSP: 50.0000 [IU] | Freq: Two times a day (BID) | SUBCUTANEOUS | Status: DC

## 2015-04-22 MED FILL — !NOVOLOG 100UNITS/ML VIAL: 100/ML | 30 days supply | Qty: 10 | Fill #1

## 2015-04-22 MED FILL — ?LISINOPRIL 10 MG TABLET: 10 | 30 days supply | Qty: 30 | Fill #3

## 2015-05-04 ENCOUNTER — Other Ambulatory Visit: Payer: Self-pay | Admitting: *Deleted

## 2015-05-16 ENCOUNTER — Other Ambulatory Visit: Payer: Self-pay | Admitting: Family Medicine

## 2015-05-16 MED FILL — $novoLIN 70/30 100 UNITS/ML: (70-30) 100 | 30 days supply | Qty: 30 | Fill #5

## 2015-05-20 ENCOUNTER — Emergency Department (HOSPITAL_COMMUNITY)
Admission: EM | Admit: 2015-05-20 | Discharge: 2015-05-20 | Disposition: A | Discharge status: Home / Self Care | Payer: Medicaid Other | Attending: Emergency Medicine | Admitting: Emergency Medicine

## 2015-05-20 ENCOUNTER — Encounter (HOSPITAL_COMMUNITY): Payer: Self-pay

## 2015-05-20 DIAGNOSIS — Z79899 Other long term (current) drug therapy: Secondary | ICD-10-CM | POA: Insufficient documentation

## 2015-05-20 DIAGNOSIS — K219 Gastro-esophageal reflux disease without esophagitis: Secondary | ICD-10-CM | POA: Diagnosis not present

## 2015-05-20 DIAGNOSIS — E11649 Type 2 diabetes mellitus with hypoglycemia without coma: Secondary | ICD-10-CM | POA: Diagnosis present

## 2015-05-20 DIAGNOSIS — R51 Headache: Secondary | ICD-10-CM | POA: Diagnosis not present

## 2015-05-20 DIAGNOSIS — Z862 Personal history of diseases of the blood and blood-forming organs and certain disorders involving the immune mechanism: Secondary | ICD-10-CM | POA: Diagnosis not present

## 2015-05-20 DIAGNOSIS — Z792 Long term (current) use of antibiotics: Secondary | ICD-10-CM | POA: Insufficient documentation

## 2015-05-20 DIAGNOSIS — Z8701 Personal history of pneumonia (recurrent): Secondary | ICD-10-CM | POA: Insufficient documentation

## 2015-05-20 DIAGNOSIS — F17210 Nicotine dependence, cigarettes, uncomplicated: Secondary | ICD-10-CM | POA: Insufficient documentation

## 2015-05-20 DIAGNOSIS — Z8659 Personal history of other mental and behavioral disorders: Secondary | ICD-10-CM | POA: Diagnosis not present

## 2015-05-20 DIAGNOSIS — E162 Hypoglycemia, unspecified: Secondary | ICD-10-CM

## 2015-05-20 DIAGNOSIS — Z794 Long term (current) use of insulin: Secondary | ICD-10-CM | POA: Insufficient documentation

## 2015-05-20 LAB — CBG MONITORING, ED: Glucose-Capillary: 121 mg/dL — ABNORMAL HIGH (ref 65–99)

## 2015-05-20 NOTE — ED Notes (Signed)
Pt comes from home via [facility], pt was unresponsive from hypoglycemia, last took insulin around 1am. CBG was 27 with EMS, PTA received 1mg  glucagon IM, CGB came up to 67, now AxO X 4

## 2015-05-20 NOTE — ED Provider Notes (Signed)
CSN: [id number]     Arrival date & time 05/20/15  5203 History   By signing my name below, I, [name], attest that this documentation has been prepared under the direction and in the presence of Zadie Rhine, MD.   Electronically Signed: [name], ED Scribe. 05/20/2015. 3:22 AM    Chief Complaint  Patient presents with  . Hypoglycemia    Patient is a 35 y.o. male presenting with hypoglycemia. The history is provided by the patient. No language interpreter was used.  Hypoglycemia Initial blood sugar:  27 Blood sugar after intervention:  121 Severity:  Moderate Onset quality:  Sudden Timing:  Constant Progression:  Improving Chronicity:  New Diabetic status:  Controlled with insulin Time since last antidiabetic medication:  2 hours Associated symptoms: no vomiting    HPI Comments: Luke [name] is a 35 y.o. male with PMHx of DM and substance abuse of cocaine and heroin who presents to the Emergency Department via EMS complaining of hypoglycemia, onset about an hour ago. Pt's wife reports that he woke up shaking. After several minutes he became non-responsive but remained conscious, prompting them to call EMS. Pt states that he took 40 units of insulin after dinner. He complains of associated headache.  Pt denies vomiting, diarrhea, abdominal pain, chest pain, or any other pertinent symptoms.  Pt takes 50 units in the morning and 40 units at night. Pt's wife states that he has had several hypoglycemic episodes lately but none as bad as tonight. Pt states that he feels fine now and wants to leave the ED without receiving blood work or any other treatment.  Pt given glucagon by EMS Past Medical History  Diagnosis Date  . Pneumonia   . Headache(784.0)   . Anxiety  Dx 2008  . Diabetes mellitus without complication Dx 2008  . GERD (gastroesophageal reflux disease) Dx 2008  . Anemia   . Seizures 2011     x 2 in lifetime. on Dilantin for a while.   . Substance abuse 2013      heroin use, multiple relapses   Past Surgical History  Procedure Laterality Date  . Irrigation and debridement abscess      Hx: of left arm abscess related to drug use   . I&d extremity Left 10/11/2012    Procedure: IRRIGATION AND DEBRIDEMENT ABSCESS FOREARM;  Surgeon: Sharma Covert, MD;  Location: [facility] OR;  Service: Orthopedics;  Laterality: Left;  . I&d extremity Left 10/12/2012    Procedure: IRRIGATION AND DEBRIDEMENT FOREARM;  Surgeon: Sharma Covert, MD;  Location: [facility] OR;  Service: Orthopedics;  Laterality: Left;  . I&d extremity Left 10/14/2012    Procedure: incision and drainage left forearm;  Surgeon: Sharma Covert, MD;  Location: [facility] OR;  Service: Orthopedics;  Laterality: Left;  . I&d extremity Left 10/16/2012    Procedure: IRRIGATION AND DEBRIDEMENT LEFT FOREARM;  Surgeon: Sharma Covert, MD;  Location: [facility] OR;  Service: Orthopedics;  Laterality: Left;  . I&d extremity Left 10/20/2012    Procedure: INCISION AND DRAINAGE AND DEBRIDEMENT LEFT  FOREARM;  Surgeon: Sharma Covert, MD;  Location: [facility] OR;  Service: Orthopedics;  Laterality: Left;   Family History  Problem Relation Age of Onset  . Diabetes Father   . Heart disease Father   . Mental illness Sister   . Cancer Neg Hx    Social History  Substance Use Topics  . Smoking status: Current Every Day Smoker -- 1.00 packs/day    Types:  Cigarettes  . Smokeless tobacco: Never Used  . Alcohol Use: Yes     Comment: occasional    Review of Systems  Cardiovascular: Negative for chest pain.  Gastrointestinal: Negative for vomiting, abdominal pain and diarrhea.  Neurological: Positive for headaches.  All other systems reviewed and are negative.   Allergies  Review of patient's allergies indicates no known allergies.  Home Medications   Prior to Admission medications   Medication Sig Start Date End Date Taking? Authorizing Provider  ACCU-CHEK SOFTCLIX LANCETS lancets 1 each by Other route 3 (three) times daily. 07/23/14    Boykin Nearing, MD  acetaminophen (TYLENOL) 325 MG tablet Take 2 tablets (650 mg total) by mouth every 6 (six) hours as needed for mild pain (or Fever >/= 101). 12/05/14   Velvet Bathe, MD  amoxicillin-clavulanate (AUGMENTIN) 875-125 MG per tablet Take 1 tablet by mouth every 12 (twelve) hours. 12/05/14   Velvet Bathe, MD  Blood Glucose Monitoring Suppl (BLOOD GLUCOSE METER KIT AND SUPPLIES) Dispense based on patient and insurance preference. Use up to four times daily as directed. (FOR ICD-9 250.00, 250.01). 11/25/13   Donita Brooks, NP  doxycycline (VIBRA-TABS) 100 MG tablet Take 1 tablet (100 mg total) by mouth 2 (two) times daily. 12/05/14   Velvet Bathe, MD  esomeprazole (NEXIUM) 40 MG capsule Take 40 mg by mouth daily before breakfast.    Historical Provider, MD  glucose blood (ACCU-CHEK SMARTVIEW) test strip 1 each by Other route 3 (three) times daily. Use as instructed 07/29/14   Boykin Nearing, MD  insulin aspart (NOVOLOG) 100 UNIT/ML injection Inject 4-16 Units into the skin 3 (three) times daily with meals. 5 units with each meal 250-300 = 4 units 301-350 = 8 units 351-400 = 12 units >450=16 units    Historical Provider, MD  insulin NPH-regular Human (NOVOLIN 70/30) (70-30) 100 UNIT/ML injection Inject 50 Units into the skin 2 (two) times daily with a meal. 04/18/15   Josalyn Funches, MD  Insulin Pen Needle (PEN NEEDLES 3/16") 31G X 5 MM MISC 1 each by Does not apply route once. 07/23/14   Josalyn Funches, MD  Insulin Syringes, Disposable, U-100 0.5 ML MISC 1 each by Does not apply route 3 (three) times daily as needed. 11/25/13   Donita Brooks, NP  Lancet Devices [facility]) lancets 1 each by Other route 3 (three) times daily. 07/23/14   Josalyn Funches, MD  lisinopril (PRINIVIL,ZESTRIL) 10 MG tablet Take 1 tablet (10 mg total) by mouth daily. 07/23/14   Josalyn Funches, MD  NOVOLIN 70/30 (70-30) 100 UNIT/ML injection INJECT 50 UNITS INTO THE SKIN TWICE DAILY WITH A MEAL 02/22/15   Boykin Nearing, MD  TRUEPLUS INSULIN SYRINGE 30G X 5/16" 0.5 ML MISC USE AS DIRECTED BY PHYSICIAN 05/16/15   Olugbemiga E Jegede, MD   BP 140/92 mmHg  Pulse 105  Temp(Src) 98.6 F (37 C) (Oral)  Resp 18  Ht '5\' 9"'$  (1.753 m)  Wt 200 lb (90.719 kg)  BMI 29.52 kg/m2  SpO2 100% Physical Exam CONSTITUTIONAL: Well developed/well nourished, eating a sandwich on my arrival to room HEAD: Normocephalic/atraumatic EYES: EOMI ENMT: Mucous membranes moist NECK: supple no meningeal signs SPINE/BACK:entire spine nontender CV: S1/S2 noted, no murmurs/rubs/gallops noted LUNGS: Lungs are clear to auscultation bilaterally, no apparent distress ABDOMEN: soft, nontender, no rebound or guarding, bowel sounds noted throughout abdomen NEURO: Pt is awake/alert/appropriate, moves all extremitiesx4.  No facial droop.   EXTREMITIES: pulses normal/equal, full ROM SKIN: warm, color normal. Multiple  healing track marks to both forearms and hands. No evidence of cellulitis.  PSYCH: no abnormalities of mood noted, alert and oriented to situation  ED Course  Procedures  DIAGNOSTIC STUDIES: Oxygen Saturation is 100% on RA, normal by my interpretation.    COORDINATION OF CARE: 3:16 AM-Discussed treatment plan which includes CBG monitoring and follow up with PCP with pt at bedside and pt agreed to plan.   Pt insists on leaving He is awake/alert, not intoxicated, and able to make his own decisions His glucose has improved He is in no distress He is taking PO He was advised to cut back his insulin dosing by half, and to check CBG frequently He needs to have PCP next week   Labs Review Labs Reviewed  CBG MONITORING, ED - Abnormal; Notable for the following:    Glucose-Capillary 121 (*)    All other components within normal limits      MDM   Final diagnoses:  Hypoglycemia   Nursing notes including past medical history and social history reviewed and considered in documentation Labs/vital reviewed myself and  considered during evaluation   I personally performed the services described in this documentation, which was scribed in my presence. The recorded information has been reviewed and is accurate.       Ripley Fraise, MD 05/20/15 [phone]

## 2015-05-20 NOTE — Discharge Instructions (Signed)
As we discussed, please cut in half all of your insulin dosing until you see your doctor Please check your blood sugar frequently  Hypoglycemia Hypoglycemia occurs when the glucose in your blood is too low. Glucose is a type of sugar that is your body's main energy source. Hormones, such as insulin and glucagon, control the level of glucose in the blood. Insulin lowers blood glucose and glucagon increases blood glucose. Having too much insulin in your blood stream, or not eating enough food containing sugar, can result in hypoglycemia. Hypoglycemia can happen to people with or without diabetes. It can develop quickly and can be a medical emergency.  CAUSES   Missing or delaying meals.  Not eating enough carbohydrates at meals.  Taking too much diabetes medicine.  Not timing your oral diabetes medicine or insulin doses with meals, snacks, and exercise.  Nausea and vomiting.  Certain medicines.  Severe illnesses, such as hepatitis, kidney disorders, and certain eating disorders.  Increased activity or exercise without eating something extra or adjusting medicines.  Drinking too much alcohol.  A nerve disorder that affects body functions like your heart rate, blood pressure, and digestion (autonomic neuropathy).  A condition where the stomach muscles do not function properly (gastroparesis). Therefore, medicines and food may not absorb properly.  Rarely, a tumor of the pancreas can produce too much insulin. SYMPTOMS   Hunger.  Sweating (diaphoresis).  Change in body temperature.  Shakiness.  Headache.  Anxiety.  Lightheadedness.  Irritability.  Difficulty concentrating.  Dry mouth.  Tingling or numbness in the hands or feet.  Restless sleep or sleep disturbances.  Altered speech and coordination.  Change in mental status.  Seizures or prolonged convulsions.  Combativeness.  Drowsiness (lethargic).  Weakness.  Increased heart rate or  palpitations.  Confusion.  Pale, gray skin color.  Blurred or double vision.  Fainting. DIAGNOSIS  A physical exam and medical history will be performed. Your caregiver may make a diagnosis based on your symptoms. Blood tests and other lab tests may be performed to confirm a diagnosis. Once the diagnosis is made, your caregiver will see if your signs and symptoms go away once your blood glucose is raised.  TREATMENT  Usually, you can easily treat your hypoglycemia when you notice symptoms.  Check your blood glucose. If it is less than 70 mg/dl, take one of the following:   3-4 glucose tablets.    cup juice.    cup regular soda.   1 cup skim milk.   -1 tube of glucose gel.   5-6 hard candies.   Avoid high-fat drinks or food that may delay a rise in blood glucose levels.  Do not take more than the recommended amount of sugary foods, drinks, gel, or tablets. Doing so will cause your blood glucose to go too high.   Wait 10-15 minutes and recheck your blood glucose. If it is still less than 70 mg/dl or below your target range, repeat treatment.   Eat a snack if it is more than 1 hour until your next meal.  There may be a time when your blood glucose may go so low that you are unable to treat yourself at home when you start to notice symptoms. You may need someone to help you. You may even faint or be unable to swallow. If you cannot treat yourself, someone will need to bring you to the hospital.  [place]  If you have diabetes, follow your diabetes management plan by:  Taking your  medicines as directed.  Following your exercise plan.  Following your meal plan. Do not skip meals. Eat on time.  Testing your blood glucose regularly. Check your blood glucose before and after exercise. If you exercise longer or different than usual, be sure to check blood glucose more frequently.  Wearing your medical alert jewelry that says you have  diabetes.  Identify the cause of your hypoglycemia. Then, develop ways to prevent the recurrence of hypoglycemia.  Do not take a hot bath or shower right after an insulin shot.  Always carry treatment with you. Glucose tablets are the easiest to carry.  If you are going to drink alcohol, drink it only with meals.  Tell friends or family members ways to keep you safe during a seizure. This may include removing hard or sharp objects from the area or turning you on your side.  Maintain a healthy weight. SEEK MEDICAL CARE IF:   You are having problems keeping your blood glucose in your target range.  You are having frequent episodes of hypoglycemia.  You feel you might be having side effects from your medicines.  You are not sure why your blood glucose is dropping so low.  You notice a change in vision or a new problem with your vision. SEEK IMMEDIATE MEDICAL CARE IF:   Confusion develops.  A change in mental status occurs.  The inability to swallow develops.  Fainting occurs.   This information is not intended to replace advice given to you by your health care provider. Make sure you discuss any questions you have with your health care provider.   Document Released: 03/05/2005 Document Revised: 03/10/2013 Document Reviewed: 11/09/2014 Elsevier Interactive Patient Education [employer].

## 2015-05-20 NOTE — ED Notes (Signed)
CBG 121. RN notified.

## 2015-05-20 NOTE — ED Notes (Signed)
Pt refuses IV and refuses blood work to be drawn, MD notified. Pt agrees to eat to keep CBG up.

## 2015-06-07 ENCOUNTER — Other Ambulatory Visit: Payer: Self-pay | Admitting: Pharmacist

## 2015-06-07 MED ORDER — GLUCOSE BLOOD VI STRP: ORAL_STRIP | Status: DC

## 2015-06-07 MED ORDER — TRUEPLUS LANCETS 28G MISC: Status: DC

## 2015-06-07 MED ORDER — TRUE METRIX METER DEVI: 1.0000 | Status: DC

## 2015-06-07 MED FILL — ACCU-CHEK SMARTVIEW STRIP: 15 days supply | Qty: 50 | Fill #2

## 2015-06-07 MED FILL — $novoLIN 70/30 100 UNITS/ML: (70-30) 100 | 30 days supply | Qty: 10 | Fill #0

## 2015-06-17 MED FILL — TRUEPLUS SYR 0.5ML 30GX5/16: 30G X 5/16" | 25 days supply | Qty: 100 | Fill #0

## 2015-06-17 MED FILL — ?LISINOPRIL 10 MG TABLET: 10 | 30 days supply | Qty: 30 | Fill #4

## 2015-06-30 MED FILL — $novoLIN 70/30 100 UNITS/ML: (70-30) 100 | 20 days supply | Qty: 20 | Fill #1

## 2015-07-22 ENCOUNTER — Other Ambulatory Visit: Payer: Self-pay | Admitting: Family Medicine

## 2015-07-22 DIAGNOSIS — IMO0002 Reserved for concepts with insufficient information to code with codable children: Secondary | ICD-10-CM

## 2015-07-22 DIAGNOSIS — E1165 Type 2 diabetes mellitus with hyperglycemia: Principal | ICD-10-CM

## 2015-07-22 DIAGNOSIS — Z794 Long term (current) use of insulin: Principal | ICD-10-CM

## 2015-07-22 DIAGNOSIS — E1129 Type 2 diabetes mellitus with other diabetic kidney complication: Secondary | ICD-10-CM

## 2015-07-22 MED FILL — $novoLIN 70/30 100 UNITS/ML: (70-30) 100 | 10 days supply | Qty: 10 | Fill #0

## 2015-07-22 NOTE — Telephone Encounter (Signed)
NOVOLIN 70/30.Marland KitchenMarland KitchenMarland KitchenMarland Kitchenplease refill patient is completely out of his insulin

## 2015-08-10 ENCOUNTER — Other Ambulatory Visit: Payer: Self-pay | Admitting: Family Medicine

## 2015-08-10 MED FILL — NOVOLIN 70/30 100 UNITS/ML: (70-30) 100 | 10 days supply | Qty: 10 | Fill #0

## 2015-08-16 ENCOUNTER — Ambulatory Visit: Payer: Self-pay | Attending: Family Medicine | Admitting: Family Medicine

## 2015-08-16 ENCOUNTER — Encounter: Payer: Self-pay | Admitting: Family Medicine

## 2015-08-16 VITALS — BP 121/76 | HR 102 | Temp 98.8°F | Resp 16 | Ht 69.0 in | Wt 188.0 lb

## 2015-08-16 DIAGNOSIS — F1910 Other psychoactive substance abuse, uncomplicated: Secondary | ICD-10-CM

## 2015-08-16 DIAGNOSIS — E1165 Type 2 diabetes mellitus with hyperglycemia: Secondary | ICD-10-CM | POA: Insufficient documentation

## 2015-08-16 DIAGNOSIS — E1140 Type 2 diabetes mellitus with diabetic neuropathy, unspecified: Secondary | ICD-10-CM

## 2015-08-16 DIAGNOSIS — F17210 Nicotine dependence, cigarettes, uncomplicated: Secondary | ICD-10-CM | POA: Insufficient documentation

## 2015-08-16 DIAGNOSIS — IMO0002 Reserved for concepts with insufficient information to code with codable children: Secondary | ICD-10-CM

## 2015-08-16 DIAGNOSIS — E109 Type 1 diabetes mellitus without complications: Secondary | ICD-10-CM | POA: Insufficient documentation

## 2015-08-16 DIAGNOSIS — Z79899 Other long term (current) drug therapy: Secondary | ICD-10-CM | POA: Insufficient documentation

## 2015-08-16 DIAGNOSIS — L03114 Cellulitis of left upper limb: Secondary | ICD-10-CM | POA: Insufficient documentation

## 2015-08-16 DIAGNOSIS — Z794 Long term (current) use of insulin: Secondary | ICD-10-CM | POA: Insufficient documentation

## 2015-08-16 DIAGNOSIS — E1129 Type 2 diabetes mellitus with other diabetic kidney complication: Secondary | ICD-10-CM

## 2015-08-16 DIAGNOSIS — F1410 Cocaine abuse, uncomplicated: Secondary | ICD-10-CM | POA: Insufficient documentation

## 2015-08-16 DIAGNOSIS — E1065 Type 1 diabetes mellitus with hyperglycemia: Secondary | ICD-10-CM | POA: Insufficient documentation

## 2015-08-16 LAB — POCT GLYCOSYLATED HEMOGLOBIN (HGB A1C): HEMOGLOBIN A1C: 10.7

## 2015-08-16 LAB — GLUCOSE, POCT (MANUAL RESULT ENTRY)
POC GLUCOSE: 70 mg/dL (ref 70–99)
POC Glucose: 45 mg/dl — AB (ref 70–99)

## 2015-08-16 MED ORDER — "PEN NEEDLES 3/16"" 31G X 5 MM MISC": 1.0000 | Freq: Four times a day (QID) | Status: DC

## 2015-08-16 MED ORDER — INSULIN GLARGINE 100 UNIT/ML SOLOSTAR PEN: 10.0000 [IU] | PEN_INJECTOR | Freq: Every day | SUBCUTANEOUS | Status: DC

## 2015-08-16 MED ORDER — INSULIN ASPART 100 UNIT/ML ~~LOC~~ SOLN: 10.0000 [IU] | Freq: Three times a day (TID) | SUBCUTANEOUS | Status: DC

## 2015-08-16 MED ORDER — SULFAMETHOXAZOLE-TRIMETHOPRIM 800-160 MG PO TABS: 1.0000 | ORAL_TABLET | Freq: Two times a day (BID) | ORAL | Status: DC

## 2015-08-16 MED ORDER — GABAPENTIN 100 MG PO CAPS
100.0000 mg | ORAL_CAPSULE | Freq: Three times a day (TID) | ORAL | Status: DC
Start: 2015-08-16 — End: 2016-04-23

## 2015-08-16 MED ORDER — INSULIN ASPART 100 UNIT/ML FLEXPEN: 10.0000 [IU] | PEN_INJECTOR | Freq: Three times a day (TID) | SUBCUTANEOUS | Status: DC

## 2015-08-16 NOTE — Progress Notes (Signed)
F/U DM  Glucose been running around 200  Low glucose today, OJ given to pt. Taking medication as prescribed  Pain scale # 6 HA  Tobacco user 1ppday  No suicidal thoughts in the past two weeks

## 2015-08-16 NOTE — Progress Notes (Signed)
Subjective:  Patient ID: Luke [name], male    DOB: 1980/11/07  Age: 35 y.o. MRN: [id number]  CC: Diabetes   HPI Luke [name] presents for   1. CHRONIC DIABETES  Disease Monitoring  Blood Sugar Ranges: "around 200" no meter   Polyuria: no   Visual problems: no   Medication Compliance: yes, 50 U of 70/30 BID   Medication Side Effects  Hypoglycemia: yes, today but not usually. He took the 70/30 and did not eat    [place] Exam: due   Foot Exam: done today    2. Substance abuse: has completely quit heroine. Still injecting cocaine. Injects into arms. In treatment at the [facility]. Last use was about 5 days ago. Has non-healing wounds in L antecubital foss.   Social History  Substance Use Topics  . Smoking status: Current Every Day Smoker -- 1.00 packs/day    Types: Cigarettes  . Smokeless tobacco: Never Used  . Alcohol Use: No     Comment: occasional    Outpatient Prescriptions Prior to Visit  Medication Sig Dispense Refill  . Blood Glucose Monitoring Suppl (TRUE METRIX METER) DEVI 1 kit by Does not apply route as directed. Use as directed 1 Device 0  . esomeprazole (NEXIUM) 40 MG capsule Take 40 mg by mouth daily before breakfast.    . insulin aspart (NOVOLOG) 100 UNIT/ML injection Inject 4-16 Units into the skin 3 (three) times daily with meals. 5 units with each meal 250-300 = 4 units 301-350 = 8 units 351-400 = 12 units >450=16 units    . Insulin Pen Needle (PEN NEEDLES 3/16") 31G X 5 MM MISC 1 each by Does not apply route once. 30 each 11  . Insulin Syringes, Disposable, U-100 0.5 ML MISC 1 each by Does not apply route 3 (three) times daily as needed. 180 each 1  . Lancet Devices (ACCU-CHEK SOFTCLIX) lancets 1 each by Other route 3 (three) times daily. 1 each 0  . lisinopril (PRINIVIL,ZESTRIL) 10 MG tablet Take 1 tablet (10 mg total) by mouth daily. 30 tablet 5  . NOVOLIN 70/30 (70-30) 100 UNIT/ML injection INJECT 50 UNITS INTO THE SKIN 2  TIMES DAILY WITH A MEAL. MUST HAVE OFFICE VISIT FOR REFILLS 10 mL 0  . TRUEPLUS INSULIN SYRINGE 30G X 5/16" 0.5 ML MISC USE AS DIRECTED BY PHYSICIAN 100 each 12  . TRUEPLUS LANCETS 28G MISC Use as directed 100 each 12  . glucose blood (TRUE METRIX BLOOD GLUCOSE TEST) test strip Use as instructed 100 each 12   No facility-administered medications prior to visit.    ROS Review of Systems  Constitutional: Negative for fever, chills, fatigue and unexpected weight change.  HENT: Positive for dental problem.   Eyes: Negative for visual disturbance.  Respiratory: Negative for cough and shortness of breath.   Cardiovascular: Negative for chest pain, palpitations and leg swelling.  Gastrointestinal: Negative for nausea, vomiting, abdominal pain, diarrhea, constipation and blood in stool.  Endocrine: Negative for polydipsia, polyphagia and polyuria.  Musculoskeletal: Positive for myalgias (pain and numbness in feet at night ). Negative for back pain, arthralgias, gait problem and neck pain.  Skin: Positive for wound. Negative for rash.  Allergic/Immunologic: Negative for immunocompromised state.  Neurological: Positive for numbness.  Hematological: Negative for adenopathy. Does not bruise/bleed easily.  Psychiatric/Behavioral: Negative for suicidal ideas, sleep disturbance and dysphoric mood. The patient is not nervous/anxious.     Objective:  BP 121/76 mmHg  Pulse 102  Temp(Src) 98.8 F (37.1 C) (Oral)  Resp 16  Ht _0  (1.753 m)  Wt 188 lb (85.276 kg)  BMI 27.75 kg/m2  SpO2 100%  BP/Weight 08/16/2015 05/20/2015 9/73/5329  Systolic BP 924 268 -  Diastolic BP 76 63 -  Wt. (Lbs) 188 200 180.56  BMI 27.75 29.52 26.65   Physical Exam  Constitutional: He appears well-developed and well-nourished. No distress.  HENT:  Head: Normocephalic and atraumatic.  Neck: Normal range of motion. Neck supple.  Cardiovascular: Normal rate, regular rhythm, normal heart sounds and intact distal pulses.    Pulmonary/Chest: Effort normal and breath sounds normal.  Musculoskeletal: He exhibits no edema.  Neurological: He is alert.  Skin: Skin is warm and dry. No rash noted. No erythema.     Psychiatric: He has a normal mood and affect.   Lab Results  Component Value Date   HGBA1C 10.6* 12/03/2014   CBG 45  Treated with soda and crackers  Repeat CBG 70  Assessment & Plan:   [name] was seen today for diabetes.  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 -     Discontinue: insulin aspart (NOVOLOG) 100 UNIT/ML injection; Inject 10 Units into the skin 3 (three) times daily before meals. -     insulin aspart (NOVOLOG) 100 UNIT/ML FlexPen; Inject 10 Units into the skin 3 (three) times daily with meals. -     Insulin Pen Needle (PEN NEEDLES 3/16") 31G X 5 MM MISC; 1 each by Does not apply route 4 (four) times daily. -     gabapentin (NEURONTIN) 100 MG capsule; Take 1 capsule (100 mg total) by mouth 3 (three) times daily.  Cellulitis of left upper extremity -     sulfamethoxazole-trimethoprim (BACTRIM DS,SEPTRA DS) 800-160 MG tablet; Take 1 tablet by mouth 2 (two) times daily.  Drug abuse, IV  Cocaine abuse   No orders of the defined types were placed in this encounter.    Follow-up: No Follow-up on file.   Boykin Nearing MD

## 2015-08-16 NOTE — Assessment & Plan Note (Signed)
Uncontrolled diabetes in setting of IV drug use and poor dentition  Changed form 70/30 insulin to lantus 10 U with plan to increase to 20 U next week novolog 10 U TID Gabapentin for neuropathic pains

## 2015-08-16 NOTE — Patient Instructions (Addendum)
Luke [name] was seen today for diabetes.  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 -     Discontinue: insulin aspart (NOVOLOG) 100 UNIT/ML injection; Inject 10 Units into the skin 3 (three) times daily before meals. -     insulin aspart (NOVOLOG) 100 UNIT/ML FlexPen; Inject 10 Units into the skin 3 (three) times daily with meals. -     Insulin Pen Needle (PEN NEEDLES 3/16") 31G X 5 MM MISC; 1 each by Does not apply route 4 (four) times daily. -     gabapentin (NEURONTIN) 100 MG capsule; Take 1 capsule (100 mg total) by mouth 3 (three) times daily.  Cellulitis of left upper extremity -     sulfamethoxazole-trimethoprim (BACTRIM DS,SEPTRA DS) 800-160 MG tablet; Take 1 tablet by mouth 2 (two) times daily.  Drug abuse, IV  Cocaine abuse   Diabetes blood sugar goals  Fasting (in AM before breakfast, 8 hrs of no eating or drinking (except water or unsweetened coffee or tea): 90-110 2 hrs after meals: < 160,   No low sugars: nothing < 70   Please apply for [facility] discount and orange card, you can also inquire if any of your medications are on the PASS (medications assistance) list.   Call for dental appointment, ask about nitrous oxide for sedation. This is the gas option   Start with 10 U of lantus, increase to 20 U in one week.  Keep novolog at 10 U three times daily with food   F/u in 2 weeks with Erline Levine for diabetes, lantus adjustment   F/u in 6 weeks with me for diabetes   Dr. Adrian Blackwater

## 2015-08-16 NOTE — Assessment & Plan Note (Signed)
Still abusing cocaine, injecting Has cellulitis on arms Treat with Bactrim Encouraged continued treatment at [facility]

## 2015-08-22 MED FILL — LANTUS SOLOSTAR 100 UNITS/M: 100 | 30 days supply | Qty: 3 | Fill #0

## 2015-08-22 MED FILL — SULFAMETHOXAZOLE/TMP DS TAB: 800-160 | 10 days supply | Qty: 20 | Fill #0

## 2015-08-26 ENCOUNTER — Telehealth: Payer: Self-pay | Admitting: Family Medicine

## 2015-08-26 MED ORDER — INSULIN NPH ISOPHANE & REGULAR (70-30) 100 UNIT/ML ~~LOC~~ SUSP: SUBCUTANEOUS | Status: DC

## 2015-08-26 MED FILL — !NOVOLIN 70/30 100 UNITS/ML: (70-30) 100 | 11 days supply | Qty: 10 | Fill #0

## 2015-08-26 MED FILL — GABAPENTIN 100 MG CAPSULE: 100 | 30 days supply | Qty: 90 | Fill #0

## 2015-08-26 NOTE — Telephone Encounter (Signed)
Called patient and he reports that he has been taking Lantus 30 units daily and his blood glucose meter continues to read as "high." No reports of hypoglycemia - instructed patien to increase Lantus to 40 units daily.   He would like the Novolin just in case he cannot get it down - I told him that I would order it until he could see me next week and we could further adjust the Lantus. Instructed patient to stop the Lantus and Novolog if he uses 70/30 - they cannot be used together.  Patient to follow up with me 08/31/15.

## 2015-08-26 NOTE — Telephone Encounter (Signed)
Patient called stating that he was switched to lantus. Since taking lantus, patient stated that her has had high sugar when he checks. Patient would like to be switched back to other DM insulin, novlin

## 2015-08-31 ENCOUNTER — Ambulatory Visit: Payer: Medicaid Other | Admitting: Pharmacist

## 2015-09-08 ENCOUNTER — Other Ambulatory Visit: Payer: Self-pay | Admitting: Family Medicine

## 2015-09-16 MED FILL — !NOVOLIN 70/30 100 UNITS/ML: (70-30) 100 | 10 days supply | Qty: 10 | Fill #0

## 2015-09-16 MED FILL — TRUEPLUS SYR 0.5ML 30GX5/16: 30G X 5/16" | 25 days supply | Qty: 100 | Fill #1

## 2015-10-03 ENCOUNTER — Telehealth: Payer: Self-pay | Admitting: Family Medicine

## 2015-10-03 ENCOUNTER — Other Ambulatory Visit: Payer: Self-pay | Admitting: Family Medicine

## 2015-10-03 DIAGNOSIS — E1165 Type 2 diabetes mellitus with hyperglycemia: Principal | ICD-10-CM

## 2015-10-03 DIAGNOSIS — IMO0002 Reserved for concepts with insufficient information to code with codable children: Secondary | ICD-10-CM

## 2015-10-03 DIAGNOSIS — E1129 Type 2 diabetes mellitus with other diabetic kidney complication: Secondary | ICD-10-CM

## 2015-10-03 DIAGNOSIS — Z794 Long term (current) use of insulin: Principal | ICD-10-CM

## 2015-10-03 MED FILL — !NOVOLIN 70/30 100 UNITS/ML: (70-30) 100 | 30 days supply | Qty: 10 | Fill #0

## 2015-10-03 NOTE — Telephone Encounter (Signed)
Patient called and is needing Novolin 70/30. Please follow up.

## 2015-10-03 NOTE — Telephone Encounter (Signed)
I routed the refill request to Dr. Adrian Blackwater - I have asked the patient to come in for follow up as he was supposed to be switched from 70/30 to basal and bolus insulin. He will not come in for a visit so I need Dr. Adrian Blackwater' input on how to proceed.

## 2015-10-03 NOTE — Telephone Encounter (Signed)
Refilled 70/30 Discontinued lantus and novolog   Patient will need to f/u for diabetes as soon as possible.

## 2015-10-14 ENCOUNTER — Telehealth: Payer: Self-pay | Admitting: Family Medicine

## 2015-10-14 DIAGNOSIS — Z794 Long term (current) use of insulin: Principal | ICD-10-CM

## 2015-10-14 DIAGNOSIS — IMO0002 Reserved for concepts with insufficient information to code with codable children: Secondary | ICD-10-CM

## 2015-10-14 DIAGNOSIS — E1165 Type 2 diabetes mellitus with hyperglycemia: Principal | ICD-10-CM

## 2015-10-14 DIAGNOSIS — E1129 Type 2 diabetes mellitus with other diabetic kidney complication: Secondary | ICD-10-CM

## 2015-10-14 MED ORDER — INSULIN NPH ISOPHANE & REGULAR (70-30) 100 UNIT/ML ~~LOC~~ SUSP: SUBCUTANEOUS | 0 refills | Status: DC

## 2015-10-14 MED FILL — NOVOLIN 70/30 100 UNITS/ML: (70-30) 100 | 10 days supply | Qty: 10 | Fill #0

## 2015-10-14 NOTE — Telephone Encounter (Signed)
Refilled x 1 vial - patient MUST have office visit to follow up with Dr. Adrian Blackwater ASAP.

## 2015-10-14 NOTE — Telephone Encounter (Signed)
Medication Refill: insulin NPH-regular Human (NOVOLIN 70/30) (70-30) 100 UNIT/ML injection

## 2015-10-26 ENCOUNTER — Other Ambulatory Visit: Payer: Self-pay | Admitting: Family Medicine

## 2015-10-26 DIAGNOSIS — IMO0002 Reserved for concepts with insufficient information to code with codable children: Secondary | ICD-10-CM

## 2015-10-26 DIAGNOSIS — Z794 Long term (current) use of insulin: Principal | ICD-10-CM

## 2015-10-26 DIAGNOSIS — E1165 Type 2 diabetes mellitus with hyperglycemia: Principal | ICD-10-CM

## 2015-10-26 DIAGNOSIS — E1129 Type 2 diabetes mellitus with other diabetic kidney complication: Secondary | ICD-10-CM

## 2015-10-26 NOTE — Telephone Encounter (Signed)
Pt. Called stating that he needs a refill on insulin NPH-regular Human (NOVOLIN 70/30) (70-30) 100 UNIT/ML injection. Pt states that his OV is not until 10/31/15 and needs his insulin. Please f/u

## 2015-10-31 ENCOUNTER — Ambulatory Visit: Payer: Self-pay | Attending: Family Medicine | Admitting: Family Medicine

## 2015-10-31 ENCOUNTER — Encounter: Payer: Self-pay | Admitting: Family Medicine

## 2015-10-31 VITALS — BP 127/86 | HR 107 | Temp 99.2°F | Ht 69.0 in | Wt 181.8 lb

## 2015-10-31 DIAGNOSIS — IMO0002 Reserved for concepts with insufficient information to code with codable children: Secondary | ICD-10-CM

## 2015-10-31 DIAGNOSIS — E1165 Type 2 diabetes mellitus with hyperglycemia: Secondary | ICD-10-CM | POA: Insufficient documentation

## 2015-10-31 DIAGNOSIS — F17210 Nicotine dependence, cigarettes, uncomplicated: Secondary | ICD-10-CM | POA: Insufficient documentation

## 2015-10-31 DIAGNOSIS — E1129 Type 2 diabetes mellitus with other diabetic kidney complication: Secondary | ICD-10-CM

## 2015-10-31 DIAGNOSIS — F1490 Cocaine use, unspecified, uncomplicated: Secondary | ICD-10-CM | POA: Insufficient documentation

## 2015-10-31 DIAGNOSIS — Z794 Long term (current) use of insulin: Secondary | ICD-10-CM | POA: Insufficient documentation

## 2015-10-31 DIAGNOSIS — R739 Hyperglycemia, unspecified: Secondary | ICD-10-CM

## 2015-10-31 DIAGNOSIS — L03114 Cellulitis of left upper limb: Secondary | ICD-10-CM

## 2015-10-31 LAB — POCT URINALYSIS DIPSTICK
BILIRUBIN UA: NEGATIVE
GLUCOSE UA: 500
Ketones, UA: NEGATIVE
LEUKOCYTES UA: NEGATIVE
NITRITE UA: NEGATIVE
Protein, UA: NEGATIVE
Spec Grav, UA: <=1.005
Urobilinogen, UA: 0.2
pH, UA: 6

## 2015-10-31 LAB — GLUCOSE, POCT (MANUAL RESULT ENTRY)
POC GLUCOSE: 514 mg/dL — AB (ref 70–99)
POC Glucose: 542 mg/dl — AB (ref 70–99)

## 2015-10-31 LAB — POCT GLYCOSYLATED HEMOGLOBIN (HGB A1C): Hemoglobin A1C: 11

## 2015-10-31 MED ORDER — INSULIN ASPART 100 UNIT/ML ~~LOC~~ SOLN
20.0000 [IU] | Freq: Once | SUBCUTANEOUS | Status: AC
  Administered 2015-10-31: 20 [IU] via SUBCUTANEOUS

## 2015-10-31 MED ORDER — INSULIN NPH ISOPHANE & REGULAR (70-30) 100 UNIT/ML ~~LOC~~ SUSP: SUBCUTANEOUS | 11 refills | Status: DC

## 2015-10-31 MED ORDER — INSULIN SYRINGES (DISPOSABLE) U-100 0.5 ML MISC: 1.0000 | Freq: Two times a day (BID) | 3 refills | Status: DC

## 2015-10-31 MED ORDER — SODIUM CHLORIDE 0.9 % IV BOLUS (SEPSIS): 1000.0000 mL | Freq: Once | INTRAVENOUS | Status: DC

## 2015-10-31 MED ORDER — "INSULIN SYRINGE-NEEDLE U-100 30G X 5/16"" 0.5 ML MISC": 1.0000 | Freq: Two times a day (BID) | 3 refills | Status: DC

## 2015-10-31 MED ORDER — SULFAMETHOXAZOLE-TRIMETHOPRIM 800-160 MG PO TABS: 1.0000 | ORAL_TABLET | Freq: Two times a day (BID) | ORAL | 0 refills | Status: DC

## 2015-10-31 MED FILL — SULFAMETHOXAZOLE-TMP DS TAB: 800-160 | 10 days supply | Qty: 20 | Fill #0

## 2015-10-31 MED FILL — $novoLIN 70/30 100 UNITS/ML: (70-30) 100 | 33 days supply | Qty: 30 | Fill #0

## 2015-10-31 MED FILL — TRUEPLUS SYR 0.5ML 31GX5/16: 31G X 5/16" | 50 days supply | Qty: 100 | Fill #0

## 2015-10-31 NOTE — Assessment & Plan Note (Signed)
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

## 2015-10-31 NOTE — Progress Notes (Signed)
CBG 514 after 20 units of insulin.

## 2015-10-31 NOTE — Assessment & Plan Note (Signed)
Uncontrolled diabetes due to med non compliance x one week, no DKA  unable to treat with IV fluids due to IV drug use hx Advised ED tranfers for IV fluids, patient  Refused  Treated with 20 U of novolog refilled insulin Reviewed s/s to prompt ED visit

## 2015-10-31 NOTE — Patient Instructions (Addendum)
Luke [name] was seen today for diabetes.  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 -     insulin aspart (novoLOG) injection 20 Units; Inject 0.2 mLs (20 Units total) into the skin once. -     insulin NPH-regular Human (NOVOLIN 70/30) (70-30) 100 UNIT/ML injection; INJECT 50 UNITS IN THE MORNING AND 40 UNITS IN THE EVENING.DO NOT TAKE WITH LANTUS OR NOVOLOG -     Insulin Syringe-Needle U-100 (TRUEPLUS INSULIN SYRINGE) 30G X 5/16" 0.5 ML MISC; 1 each by Other route 2 (two) times daily. -     Insulin Syringes, Disposable, U-100 0.5 ML MISC; 1 each by Does not apply route 2 (two) times daily.  Hyperglycemia -     Discontinue: sodium chloride 0.9 % bolus 1,000 mL; Inject 1,000 mLs into the vein once. -     insulin aspart (novoLOG) injection 20 Units; Inject 0.2 mLs (20 Units total) into the skin once. -     POCT urinalysis dipstick  Cellulitis of left upper extremity -     sulfamethoxazole-trimethoprim (BACTRIM DS,SEPTRA DS) 800-160 MG tablet; Take 1 tablet by mouth 2 (two) times daily.   I recommend that you go to ED for IV fluids since your sugar is high and it is difficult for you to have IV placed, you refused this today.  We treated with sugars with 20 U of novolog and there are no ketones in your urine.   Go to ED for IV fluids if you develop nausea, vomiting, abdominal pain or confusion Otherwise drink plenty of water  F/u in 4 weeks for diabetes and flu shot   Dr. Adrian Blackwater

## 2015-10-31 NOTE — Progress Notes (Signed)
Subjective:  Patient ID: Luke [name], male    DOB: June 21, 1980  Age: 35 y.o. MRN: [id number]  CC: Diabetes   HPI Luke [name] has hx of IV drug use, cocaine use, diabetes he  presents for   1. Diabetes: ran out of 70/30 one week ago. No CP, SOB, emesis, nausea, fever or chills.   2. IV cocaine use: still using. No longer in hands. Arms only. Last use about 1 week ago. Going to [facility] and [facility].   Social History  Substance Use Topics  . Smoking status: Current Every Day Smoker    Packs/day: 1.00    Types: Cigarettes  . Smokeless tobacco: Never Used  . Alcohol use No     Comment: occasional    Outpatient Medications Prior to Visit  Medication Sig Dispense Refill  . Blood Glucose Monitoring Suppl (TRUE METRIX METER) DEVI 1 kit by Does not apply route as directed. Use as directed 1 Device 0  . esomeprazole (NEXIUM) 40 MG capsule Take 40 mg by mouth daily before breakfast.    . gabapentin (NEURONTIN) 100 MG capsule Take 1 capsule (100 mg total) by mouth 3 (three) times daily. 90 capsule 3  . glucose blood (TRUE METRIX BLOOD GLUCOSE TEST) test strip Use as instructed 100 each 12  . Insulin Pen Needle (PEN NEEDLES 3/16") 31G X 5 MM MISC 1 each by Does not apply route 4 (four) times daily. 120 each 11  . Insulin Syringes, Disposable, U-100 0.5 ML MISC 1 each by Does not apply route 3 (three) times daily as needed. 180 each 1  . Lancet Devices (ACCU-CHEK SOFTCLIX) lancets 1 each by Other route 3 (three) times daily. 1 each 0  . lisinopril (PRINIVIL,ZESTRIL) 10 MG tablet Take 1 tablet (10 mg total) by mouth daily. 30 tablet 5  . NOVOLIN 70/30 (70-30) 100 UNIT/ML injection INJECT 50 UNITS IN THE MORNING AND 40 UNITS IN THE EVENING.DO NOT TAKE WITH LANTUS OR NOVOLOG **NEED OFFICE VISIT FOR ADDITIONAL REFILLS** 10 mL 0  . sulfamethoxazole-trimethoprim (BACTRIM DS,SEPTRA DS) 800-160 MG tablet Take 1 tablet by mouth 2 (two) times daily. 20 tablet 0  . TRUEPLUS INSULIN SYRINGE 30G X 5/16"  0.5 ML MISC USE AS DIRECTED BY PHYSICIAN 100 each 12  . TRUEPLUS LANCETS 28G MISC Use as directed 100 each 12   No facility-administered medications prior to visit.     ROS Review of Systems  Constitutional: Negative for chills, fatigue, fever and unexpected weight change.  HENT: Positive for dental problem.   Eyes: Negative for visual disturbance.  Respiratory: Negative for cough and shortness of breath.   Cardiovascular: Negative for chest pain, palpitations and leg swelling.  Gastrointestinal: Negative for abdominal pain, blood in stool, constipation, diarrhea, nausea and vomiting.  Endocrine: Negative for polydipsia, polyphagia and polyuria.  Musculoskeletal: Positive for myalgias (pain and numbness in feet at night ). Negative for arthralgias, back pain, gait problem and neck pain.  Skin: Positive for wound. Negative for rash.  Allergic/Immunologic: Negative for immunocompromised state.  Neurological: Positive for numbness.  Hematological: Negative for adenopathy. Does not bruise/bleed easily.  Psychiatric/Behavioral: Negative for dysphoric mood, sleep disturbance and suicidal ideas. The patient is not nervous/anxious.     Objective:  BP 127/86 (BP Location: Left Arm, Patient Position: Sitting, Cuff Size: Large)   Pulse (!) 107   Temp 99.2 F (37.3 C) (Oral)   Ht '5\' 9"'$  (1.753 m)   Wt 181 lb 12.8 oz (82.5 kg)  SpO2 97%   BMI 26.85 kg/m   BP/Weight 10/31/2015 08/16/2015 05/20/2015  Systolic BP 127 121 119  Diastolic BP 86 76 63  Wt. (Lbs) 181.8 188 200  BMI 26.85 27.75 29.52    Physical Exam  Constitutional: He appears well-developed and well-nourished. No distress.  HENT:  Head: Normocephalic and atraumatic.  Neck: Normal range of motion. Neck supple.  Cardiovascular: Normal rate, regular rhythm, normal heart sounds and intact distal pulses.   Pulmonary/Chest: Effort normal and breath sounds normal.  Musculoskeletal: He exhibits no edema.  Neurological: He is alert.    Skin: Skin is warm and dry. No rash noted. No erythema.     Psychiatric: He has a normal mood and affect.    Lab Results  Component Value Date   HGBA1C 11.0 10/31/2015   CBG 542 UA negative ketones  Treated with 20 U of novolog Unable to place PIV due to UE IV drug use with multiple abscesses  Repeat CBG 514  Assessment & Plan:   Luke [name] was seen today for diabetes.  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 -     insulin aspart (novoLOG) injection 20 Units; Inject 0.2 mLs (20 Units total) into the skin once. -     insulin NPH-regular Human (NOVOLIN 70/30) (70-30) 100 UNIT/ML injection; INJECT 50 UNITS IN THE MORNING AND 40 UNITS IN THE EVENING.DO NOT TAKE WITH LANTUS OR NOVOLOG -     Insulin Syringe-Needle U-100 (TRUEPLUS INSULIN SYRINGE) 30G X 5/16" 0.5 ML MISC; 1 each by Other route 2 (two) times daily. -     Insulin Syringes, Disposable, U-100 0.5 ML MISC; 1 each by Does not apply route 2 (two) times daily. -     Glucose (CBG)  Hyperglycemia -     Discontinue: sodium chloride 0.9 % bolus 1,000 mL; Inject 1,000 mLs into the vein once. -     insulin aspart (novoLOG) injection 20 Units; Inject 0.2 mLs (20 Units total) into the skin once. -     POCT urinalysis dipstick  Cellulitis of left upper extremity -     sulfamethoxazole-trimethoprim (BACTRIM DS,SEPTRA DS) 800-160 MG tablet; Take 1 tablet by mouth 2 (two) times daily.  patient refused to go to ED for IV placement to treat hyperglycemia    Meds ordered this encounter  Medications  . DISCONTD: sodium chloride 0.9 % bolus 1,000 mL  . insulin aspart (novoLOG) injection 20 Units  . sulfamethoxazole-trimethoprim (BACTRIM DS,SEPTRA DS) 800-160  MG tablet    Sig: Take 1 tablet by mouth 2 (two) times daily.    Dispense:  20 tablet    Refill:  0  . insulin NPH-regular Human (NOVOLIN 70/30) (70-30) 100 UNIT/ML injection    Sig: INJECT 50 UNITS IN THE MORNING AND 40 UNITS IN THE EVENING.DO NOT TAKE WITH LANTUS OR NOVOLOG    Dispense:  10 mL    Refill:  11  . Insulin Syringe-Needle U-100 (TRUEPLUS INSULIN SYRINGE) 30G X 5/16" 0.5 ML MISC    Sig: 1 each by Other route 2 (two) times daily.    Dispense:  180 each    Refill:  3  . Insulin Syringes, Disposable, U-100 0.5  ML MISC    Sig: 1 each by Does not apply route 2 (two) times daily.    Dispense:  180 each    Refill:  3    Follow-up: Return in about 4 weeks (around 11/28/2015) for diabetes and flu shot .   Boykin Nearing MD

## 2015-12-06 ENCOUNTER — Other Ambulatory Visit: Payer: Self-pay | Admitting: Family Medicine

## 2015-12-06 DIAGNOSIS — I10 Essential (primary) hypertension: Secondary | ICD-10-CM

## 2015-12-06 MED FILL — $novoLIN 70/30 100 UNITS/ML: (70-30) 100 | 10 days supply | Qty: 10 | Fill #0

## 2015-12-06 MED FILL — TRUE METRIX TEST STRIP: 20 days supply | Qty: 50 | Fill #0

## 2015-12-06 MED FILL — TRUEPLUS SYR 0.5ML 30GX5/16: 30G X 5/16" | 50 days supply | Qty: 100 | Fill #0

## 2015-12-06 MED FILL — !TRUE METRIX BLOOD GLUCOSE: 1 days supply | Qty: 1 | Fill #0

## 2015-12-06 MED FILL — GABAPENTIN 100 MG CAPSULE: 100 | 30 days supply | Qty: 90 | Fill #1

## 2015-12-07 MED FILL — LISINOPRIL 10 MG TABLET: 10 | 30 days supply | Qty: 30 | Fill #0

## 2015-12-19 MED FILL — $novoLIN 70/30 100 UNITS/ML: (70-30) 100 | 33 days supply | Qty: 30 | Fill #1

## 2016-01-13 MED FILL — GABAPENTIN 100 MG CAPSULE: 100 | 30 days supply | Qty: 90 | Fill #2

## 2016-01-13 MED FILL — $novoLIN 70/30 100 UNITS/ML: (70-30) 100 | 33 days supply | Qty: 30 | Fill #2

## 2016-01-16 ENCOUNTER — Other Ambulatory Visit: Payer: Self-pay | Admitting: Family Medicine

## 2016-01-16 DIAGNOSIS — I10 Essential (primary) hypertension: Secondary | ICD-10-CM

## 2016-01-16 MED FILL — TRUEPLUS SYR 0.5ML 30GX5/16: 30G X 5/16" | 50 days supply | Qty: 100 | Fill #1

## 2016-01-17 MED FILL — ?LISINOPRIL 10 MG TABLET: 10 | 30 days supply | Qty: 30 | Fill #0

## 2016-03-21 MED FILL — $novoLIN 70/30 100 UNITS/ML: (70-30) 100 | 33 days supply | Qty: 30 | Fill #3

## 2016-03-23 ENCOUNTER — Ambulatory Visit: Payer: Medicaid Other | Attending: Family Medicine

## 2016-03-23 ENCOUNTER — Other Ambulatory Visit: Payer: Self-pay | Admitting: Family Medicine

## 2016-03-23 DIAGNOSIS — I10 Essential (primary) hypertension: Secondary | ICD-10-CM

## 2016-03-23 MED FILL — GABAPENTIN 100 MG CAPSULE: 100 | 30 days supply | Qty: 90 | Fill #3

## 2016-03-23 MED FILL — LISINOPRIL 10 MG TABLET: 10 | 30 days supply | Qty: 30 | Fill #0

## 2016-03-27 MED FILL — TRUEPLUS SYR 0.5ML 31GX5/16: 31G X 5/16" | 50 days supply | Qty: 100 | Fill #0

## 2016-04-09 ENCOUNTER — Other Ambulatory Visit: Payer: Self-pay

## 2016-04-09 DIAGNOSIS — E1165 Type 2 diabetes mellitus with hyperglycemia: Principal | ICD-10-CM

## 2016-04-09 DIAGNOSIS — Z794 Long term (current) use of insulin: Principal | ICD-10-CM

## 2016-04-09 DIAGNOSIS — IMO0002 Reserved for concepts with insufficient information to code with codable children: Secondary | ICD-10-CM

## 2016-04-09 DIAGNOSIS — E1129 Type 2 diabetes mellitus with other diabetic kidney complication: Secondary | ICD-10-CM

## 2016-04-09 MED ORDER — INSULIN NPH ISOPHANE & REGULAR (70-30) 100 UNIT/ML ~~LOC~~ SUSP: SUBCUTANEOUS | 3 refills | Status: DC

## 2016-04-19 ENCOUNTER — Other Ambulatory Visit: Payer: Self-pay

## 2016-04-19 ENCOUNTER — Other Ambulatory Visit: Payer: Self-pay | Admitting: Family Medicine

## 2016-04-19 DIAGNOSIS — IMO0002 Reserved for concepts with insufficient information to code with codable children: Secondary | ICD-10-CM

## 2016-04-19 DIAGNOSIS — Z794 Long term (current) use of insulin: Principal | ICD-10-CM

## 2016-04-19 DIAGNOSIS — E1165 Type 2 diabetes mellitus with hyperglycemia: Principal | ICD-10-CM

## 2016-04-19 DIAGNOSIS — E1129 Type 2 diabetes mellitus with other diabetic kidney complication: Secondary | ICD-10-CM

## 2016-04-19 MED ORDER — INSULIN NPH (HUMAN) (ISOPHANE) 100 UNIT/ML ~~LOC~~ SUSP: SUBCUTANEOUS | 3 refills | Status: DC

## 2016-04-19 MED ORDER — INSULIN NPH ISOPHANE & REGULAR (70-30) 100 UNIT/ML ~~LOC~~ SUSP: SUBCUTANEOUS | 3 refills | Status: DC

## 2016-04-19 MED FILL — !NOVOLIN 70/30 100 UNITS/ML: (70-30) 100 | 22 days supply | Qty: 20 | Fill #0

## 2016-04-23 ENCOUNTER — Other Ambulatory Visit: Payer: Self-pay | Admitting: Family Medicine

## 2016-04-23 DIAGNOSIS — IMO0002 Reserved for concepts with insufficient information to code with codable children: Secondary | ICD-10-CM

## 2016-04-23 DIAGNOSIS — E1129 Type 2 diabetes mellitus with other diabetic kidney complication: Secondary | ICD-10-CM

## 2016-04-23 DIAGNOSIS — E1140 Type 2 diabetes mellitus with diabetic neuropathy, unspecified: Secondary | ICD-10-CM

## 2016-04-23 DIAGNOSIS — E1165 Type 2 diabetes mellitus with hyperglycemia: Principal | ICD-10-CM

## 2016-04-23 DIAGNOSIS — Z794 Long term (current) use of insulin: Principal | ICD-10-CM

## 2016-04-23 MED FILL — TRUEPLUS SYR 0.5ML 31GX5/16: 31G X 5/16" | 50 days supply | Qty: 100 | Fill #1

## 2016-04-23 MED FILL — ?GABAPENTIN 100 MG CAP: 30 days supply | Qty: 90 | Fill #0

## 2016-05-22 MED FILL — !NOVOLIN 70/30 100 UNITS/ML: (70-30) 100 | 22 days supply | Qty: 20 | Fill #1

## 2016-05-23 ENCOUNTER — Other Ambulatory Visit: Payer: Self-pay

## 2016-06-12 ENCOUNTER — Other Ambulatory Visit: Payer: Self-pay | Admitting: Family Medicine

## 2016-06-12 DIAGNOSIS — Z794 Long term (current) use of insulin: Principal | ICD-10-CM

## 2016-06-12 DIAGNOSIS — E1140 Type 2 diabetes mellitus with diabetic neuropathy, unspecified: Secondary | ICD-10-CM

## 2016-06-12 DIAGNOSIS — IMO0002 Reserved for concepts with insufficient information to code with codable children: Secondary | ICD-10-CM

## 2016-06-12 DIAGNOSIS — E1165 Type 2 diabetes mellitus with hyperglycemia: Principal | ICD-10-CM

## 2016-06-12 DIAGNOSIS — E1129 Type 2 diabetes mellitus with other diabetic kidney complication: Secondary | ICD-10-CM

## 2016-06-12 MED FILL — GABAPENTIN 100 MG CAPSULE: 100 | 30 days supply | Qty: 90 | Fill #0

## 2016-06-12 MED FILL — TRUEPLUS SYR 0.5ML 31GX5/16: 31G X 5/16" | 50 days supply | Qty: 100 | Fill #2

## 2016-06-12 MED FILL — NOVOLIN 70/30 100 UNITS/ML: (70-30) 100 | 22 days supply | Qty: 20 | Fill #2

## 2016-06-12 MED FILL — LISINOPRIL 10 MG TABLET: 10 | 30 days supply | Qty: 30 | Fill #1

## 2016-07-02 MED FILL — TRUEPLUS SYR 0.5ML 31GX5/16: 31G X 5/16" | 50 days supply | Qty: 100 | Fill #3

## 2016-07-02 MED FILL — $Humulin 70/30 vial: (70-30) 100 | 90 days supply | Qty: 90 | Fill #0

## 2016-07-30 ENCOUNTER — Other Ambulatory Visit: Payer: Self-pay | Admitting: Family Medicine

## 2016-07-30 DIAGNOSIS — E1129 Type 2 diabetes mellitus with other diabetic kidney complication: Secondary | ICD-10-CM

## 2016-07-30 DIAGNOSIS — E1140 Type 2 diabetes mellitus with diabetic neuropathy, unspecified: Secondary | ICD-10-CM

## 2016-07-30 DIAGNOSIS — E1165 Type 2 diabetes mellitus with hyperglycemia: Principal | ICD-10-CM

## 2016-07-30 DIAGNOSIS — Z794 Long term (current) use of insulin: Principal | ICD-10-CM

## 2016-07-30 DIAGNOSIS — IMO0002 Reserved for concepts with insufficient information to code with codable children: Secondary | ICD-10-CM

## 2016-07-31 MED FILL — GABAPENTIN 100 MG CAPSULE: 100 | 30 days supply | Qty: 90 | Fill #0

## 2016-08-01 ENCOUNTER — Encounter: Payer: Self-pay | Admitting: Family Medicine

## 2016-08-20 ENCOUNTER — Other Ambulatory Visit: Payer: Self-pay | Admitting: Family Medicine

## 2016-08-20 MED FILL — TRUE METRIX TEST STRIP: 25 days supply | Qty: 100 | Fill #0

## 2016-08-20 MED FILL — TRUEPLUS SYR 0.5ML 31GX5/16: 31G X 5/16" | 50 days supply | Qty: 100 | Fill #1

## 2016-08-21 ENCOUNTER — Other Ambulatory Visit: Payer: Self-pay | Admitting: Family Medicine

## 2016-08-21 DIAGNOSIS — E1129 Type 2 diabetes mellitus with other diabetic kidney complication: Secondary | ICD-10-CM

## 2016-08-21 DIAGNOSIS — IMO0002 Reserved for concepts with insufficient information to code with codable children: Secondary | ICD-10-CM

## 2016-08-21 DIAGNOSIS — E1165 Type 2 diabetes mellitus with hyperglycemia: Principal | ICD-10-CM

## 2016-10-11 ENCOUNTER — Other Ambulatory Visit: Payer: Self-pay | Admitting: Family Medicine

## 2016-10-11 DIAGNOSIS — E1165 Type 2 diabetes mellitus with hyperglycemia: Principal | ICD-10-CM

## 2016-10-11 DIAGNOSIS — E1129 Type 2 diabetes mellitus with other diabetic kidney complication: Secondary | ICD-10-CM

## 2016-10-11 DIAGNOSIS — E1140 Type 2 diabetes mellitus with diabetic neuropathy, unspecified: Secondary | ICD-10-CM

## 2016-10-11 DIAGNOSIS — Z794 Long term (current) use of insulin: Principal | ICD-10-CM

## 2016-10-11 DIAGNOSIS — IMO0002 Reserved for concepts with insufficient information to code with codable children: Secondary | ICD-10-CM

## 2016-10-11 MED FILL — $Humulin 70/30 vial: (70-30) 100 | 30 days supply | Qty: 30 | Fill #1

## 2016-10-11 MED FILL — TRUEPLUS SYR 0.5ML 31GX5/16: 31G X 5/16" | 50 days supply | Qty: 100 | Fill #2

## 2016-10-15 MED FILL — !NOVOLOG 100UNITS/ML VIAL: 100/ML | 28 days supply | Qty: 10 | Fill #0

## 2016-11-26 ENCOUNTER — Other Ambulatory Visit: Payer: Self-pay | Admitting: Family Medicine

## 2016-11-26 DIAGNOSIS — IMO0002 Reserved for concepts with insufficient information to code with codable children: Secondary | ICD-10-CM

## 2016-11-26 DIAGNOSIS — E1129 Type 2 diabetes mellitus with other diabetic kidney complication: Secondary | ICD-10-CM

## 2016-11-26 DIAGNOSIS — Z794 Long term (current) use of insulin: Principal | ICD-10-CM

## 2016-11-26 DIAGNOSIS — E1140 Type 2 diabetes mellitus with diabetic neuropathy, unspecified: Secondary | ICD-10-CM

## 2016-11-26 DIAGNOSIS — E1165 Type 2 diabetes mellitus with hyperglycemia: Principal | ICD-10-CM

## 2016-11-26 MED FILL — TRUEPLUS SYR 0.5ML 31GX5/16: 31G X 5/16" | 50 days supply | Qty: 100 | Fill #0

## 2016-11-26 MED FILL — $Humulin 70/30 vial: (70-30) 100 | 30 days supply | Qty: 30 | Fill #2

## 2016-12-16 ENCOUNTER — Encounter (HOSPITAL_COMMUNITY): Payer: Self-pay

## 2016-12-16 ENCOUNTER — Inpatient Hospital Stay (HOSPITAL_COMMUNITY)
Admission: EM | Admit: 2016-12-16 | Discharge: 2016-12-19 | DRG: 872 | Disposition: A | Discharge status: Home / Self Care | Payer: Self-pay | Attending: Internal Medicine | Admitting: Internal Medicine

## 2016-12-16 ENCOUNTER — Emergency Department (HOSPITAL_COMMUNITY): Payer: Self-pay

## 2016-12-16 DIAGNOSIS — E1142 Type 2 diabetes mellitus with diabetic polyneuropathy: Secondary | ICD-10-CM | POA: Diagnosis present

## 2016-12-16 DIAGNOSIS — N319 Neuromuscular dysfunction of bladder, unspecified: Secondary | ICD-10-CM

## 2016-12-16 DIAGNOSIS — E871 Hypo-osmolality and hyponatremia: Secondary | ICD-10-CM | POA: Diagnosis present

## 2016-12-16 DIAGNOSIS — A419 Sepsis, unspecified organism: Principal | ICD-10-CM | POA: Diagnosis present

## 2016-12-16 DIAGNOSIS — E1165 Type 2 diabetes mellitus with hyperglycemia: Secondary | ICD-10-CM | POA: Diagnosis present

## 2016-12-16 DIAGNOSIS — B9562 Methicillin resistant Staphylococcus aureus infection as the cause of diseases classified elsewhere: Secondary | ICD-10-CM | POA: Diagnosis present

## 2016-12-16 DIAGNOSIS — F17210 Nicotine dependence, cigarettes, uncomplicated: Secondary | ICD-10-CM | POA: Diagnosis present

## 2016-12-16 DIAGNOSIS — Z8249 Family history of ischemic heart disease and other diseases of the circulatory system: Secondary | ICD-10-CM

## 2016-12-16 DIAGNOSIS — R339 Retention of urine, unspecified: Secondary | ICD-10-CM | POA: Diagnosis present

## 2016-12-16 DIAGNOSIS — D649 Anemia, unspecified: Secondary | ICD-10-CM

## 2016-12-16 DIAGNOSIS — K219 Gastro-esophageal reflux disease without esophagitis: Secondary | ICD-10-CM | POA: Diagnosis present

## 2016-12-16 DIAGNOSIS — E1129 Type 2 diabetes mellitus with other diabetic kidney complication: Secondary | ICD-10-CM | POA: Diagnosis present

## 2016-12-16 DIAGNOSIS — E1042 Type 1 diabetes mellitus with diabetic polyneuropathy: Secondary | ICD-10-CM

## 2016-12-16 DIAGNOSIS — R739 Hyperglycemia, unspecified: Secondary | ICD-10-CM | POA: Diagnosis present

## 2016-12-16 DIAGNOSIS — F1490 Cocaine use, unspecified, uncomplicated: Secondary | ICD-10-CM | POA: Diagnosis present

## 2016-12-16 DIAGNOSIS — F1910 Other psychoactive substance abuse, uncomplicated: Secondary | ICD-10-CM | POA: Diagnosis present

## 2016-12-16 DIAGNOSIS — A4902 Methicillin resistant Staphylococcus aureus infection, unspecified site: Secondary | ICD-10-CM

## 2016-12-16 DIAGNOSIS — R509 Fever, unspecified: Secondary | ICD-10-CM | POA: Insufficient documentation

## 2016-12-16 DIAGNOSIS — G629 Polyneuropathy, unspecified: Secondary | ICD-10-CM

## 2016-12-16 DIAGNOSIS — Z9889 Other specified postprocedural states: Secondary | ICD-10-CM

## 2016-12-16 DIAGNOSIS — N412 Abscess of prostate: Secondary | ICD-10-CM | POA: Diagnosis present

## 2016-12-16 DIAGNOSIS — I10 Essential (primary) hypertension: Secondary | ICD-10-CM | POA: Diagnosis present

## 2016-12-16 DIAGNOSIS — Z79899 Other long term (current) drug therapy: Secondary | ICD-10-CM

## 2016-12-16 DIAGNOSIS — Z833 Family history of diabetes mellitus: Secondary | ICD-10-CM

## 2016-12-16 DIAGNOSIS — R0600 Dyspnea, unspecified: Secondary | ICD-10-CM

## 2016-12-16 DIAGNOSIS — Z818 Family history of other mental and behavioral disorders: Secondary | ICD-10-CM

## 2016-12-16 DIAGNOSIS — E1140 Type 2 diabetes mellitus with diabetic neuropathy, unspecified: Secondary | ICD-10-CM

## 2016-12-16 DIAGNOSIS — N179 Acute kidney failure, unspecified: Secondary | ICD-10-CM | POA: Diagnosis present

## 2016-12-16 DIAGNOSIS — Z794 Long term (current) use of insulin: Secondary | ICD-10-CM

## 2016-12-16 DIAGNOSIS — F1110 Opioid abuse, uncomplicated: Secondary | ICD-10-CM | POA: Diagnosis present

## 2016-12-16 DIAGNOSIS — R000 Tachycardia, unspecified: Secondary | ICD-10-CM | POA: Diagnosis present

## 2016-12-16 DIAGNOSIS — N410 Acute prostatitis: Secondary | ICD-10-CM

## 2016-12-16 LAB — I-STAT VENOUS BLOOD GAS, ED
ACID-BASE EXCESS: 3 mmol/L — AB (ref 0.0–2.0)
Bicarbonate: 27.3 mmol/L (ref 20.0–28.0)
O2 SAT: 83 %
PH VEN: 7.443 — AB (ref 7.250–7.430)
PO2 VEN: 46 mmHg — AB (ref 32.0–45.0)
TCO2: 29 mmol/L (ref 22–32)
pCO2, Ven: 40 mmHg — ABNORMAL LOW (ref 44.0–60.0)

## 2016-12-16 LAB — CBC WITH DIFFERENTIAL/PLATELET
BASOS ABS: 0 10*3/uL (ref 0.0–0.1)
BASOS PCT: 0 %
EOS ABS: 0.3 10*3/uL (ref 0.0–0.7)
Eosinophils Relative: 1 %
HCT: 34.4 % — ABNORMAL LOW (ref 39.0–52.0)
Hemoglobin: 11.6 g/dL — ABNORMAL LOW (ref 13.0–17.0)
LYMPHS ABS: 3.9 10*3/uL (ref 0.7–4.0)
LYMPHS PCT: 15 %
MCH: 27.9 pg (ref 26.0–34.0)
MCHC: 33.7 g/dL (ref 30.0–36.0)
MCV: 82.7 fL (ref 78.0–100.0)
MONO ABS: 2.3 10*3/uL — AB (ref 0.1–1.0)
Monocytes Relative: 9 %
NEUTROS ABS: 19.5 10*3/uL — AB (ref 1.7–7.7)
Neutrophils Relative %: 75 %
PLATELETS: 372 10*3/uL (ref 150–400)
RBC: 4.16 MIL/uL — ABNORMAL LOW (ref 4.22–5.81)
RDW: 13.4 % (ref 11.5–15.5)
Smear Review: ADEQUATE
WBC: 26 10*3/uL — ABNORMAL HIGH (ref 4.0–10.5)

## 2016-12-16 LAB — URINALYSIS, ROUTINE W REFLEX MICROSCOPIC
BILIRUBIN URINE: NEGATIVE
GLUCOSE, UA: NEGATIVE mg/dL
KETONES UR: 5 mg/dL — AB
Nitrite: NEGATIVE
PROTEIN: 100 mg/dL — AB
SQUAMOUS EPITHELIAL / LPF: NONE SEEN
Specific Gravity, Urine: 1.025 (ref 1.005–1.030)
pH: 5 (ref 5.0–8.0)

## 2016-12-16 LAB — CBG MONITORING, ED
GLUCOSE-CAPILLARY: 221 mg/dL — AB (ref 65–99)
GLUCOSE-CAPILLARY: 554 mg/dL — AB (ref 65–99)

## 2016-12-16 LAB — BASIC METABOLIC PANEL
Anion gap: 11 (ref 5–15)
BUN: 16 mg/dL (ref 6–20)
CALCIUM: 8.6 mg/dL — AB (ref 8.9–10.3)
CHLORIDE: 95 mmol/L — AB (ref 101–111)
CO2: 22 mmol/L (ref 22–32)
CREATININE: 1.51 mg/dL — AB (ref 0.61–1.24)
GFR calc non Af Amer: 58 mL/min — ABNORMAL LOW (ref >60)
Glucose, Bld: 534 mg/dL (ref 65–99)
Potassium: 5.1 mmol/L (ref 3.5–5.1)
SODIUM: 128 mmol/L — AB (ref 135–145)

## 2016-12-16 LAB — I-STAT CG4 LACTIC ACID, ED: LACTIC ACID, VENOUS: 1.71 mmol/L (ref 0.5–1.9)

## 2016-12-16 MED ORDER — INSULIN ASPART 100 UNIT/ML IV SOLN: 10.0000 [IU] | Freq: Once | INTRAVENOUS | Status: DC

## 2016-12-16 MED ORDER — ACETAMINOPHEN 500 MG PO TABS
1000.0000 mg | ORAL_TABLET | Freq: Once | ORAL | Status: AC
  Administered 2016-12-16: 1000 mg via ORAL
  Filled 2016-12-16: qty 2

## 2016-12-16 MED ORDER — INSULIN REGULAR HUMAN 100 UNIT/ML IJ SOLN
INTRAMUSCULAR | Status: DC
  Administered 2016-12-16: 5.4 [IU]/h via INTRAVENOUS
  Filled 2016-12-16: qty 1

## 2016-12-16 MED ORDER — DEXTROSE 5 % IV SOLN: 1.0000 g | Freq: Once | INTRAVENOUS | Status: DC

## 2016-12-16 MED ORDER — SODIUM CHLORIDE 0.9 % IV SOLN
INTRAVENOUS | Status: DC
  Administered 2016-12-16 – 2016-12-18 (×4): via INTRAVENOUS

## 2016-12-16 MED ORDER — SODIUM CHLORIDE 0.9 % IV BOLUS (SEPSIS)
1000.0000 mL | Freq: Once | INTRAVENOUS | Status: AC
  Administered 2016-12-16: 1000 mL via INTRAVENOUS

## 2016-12-16 MED ORDER — SODIUM CHLORIDE 0.9 % IV SOLN
1250.0000 mg | Freq: Two times a day (BID) | INTRAVENOUS | Status: DC
  Filled 2016-12-16 (×2): qty 1250

## 2016-12-16 MED ORDER — IOPAMIDOL (ISOVUE-300) INJECTION 61%
100.0000 mL | Freq: Once | INTRAVENOUS | Status: AC | PRN
  Administered 2016-12-16: 100 mL via INTRAVENOUS

## 2016-12-16 MED ORDER — VANCOMYCIN HCL IN DEXTROSE 1-5 GM/200ML-% IV SOLN
1000.0000 mg | Freq: Two times a day (BID) | INTRAVENOUS | Status: DC
  Administered 2016-12-17 – 2016-12-18 (×5): 1000 mg via INTRAVENOUS
  Filled 2016-12-16 (×7): qty 200

## 2016-12-16 MED ORDER — PIPERACILLIN-TAZOBACTAM 3.375 G IVPB 30 MIN
3.3750 g | Freq: Once | INTRAVENOUS | Status: AC
  Administered 2016-12-17: 3.375 g via INTRAVENOUS
  Filled 2016-12-16: qty 50

## 2016-12-16 MED ORDER — PIPERACILLIN-TAZOBACTAM 3.375 G IVPB
3.3750 g | Freq: Three times a day (TID) | INTRAVENOUS | Status: DC
  Administered 2016-12-17 – 2016-12-19 (×7): 3.375 g via INTRAVENOUS
  Filled 2016-12-16 (×10): qty 50

## 2016-12-16 MED ORDER — CIPROFLOXACIN HCL 500 MG PO TABS
500.0000 mg | ORAL_TABLET | Freq: Once | ORAL | Status: AC
  Administered 2016-12-16: 500 mg via ORAL
  Filled 2016-12-16: qty 1

## 2016-12-16 NOTE — ED Notes (Signed)
Nurse currently drawing labs 

## 2016-12-16 NOTE — ED Provider Notes (Signed)
[facility] DEPT Provider Note   CSN: [id number] Arrival date & time: 12/16/16  1533     History   Chief Complaint Chief Complaint  Patient presents with  . Urinary Retention    HPI Luke [name] is a 36 y.o. male.  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 Patient is had elevated glucose of past few weeks. Patient denies history of similar presentation. Patient denies changes in stools. Patient denies new medication, focal weakness or numbness in his legs. No discharge in the penis.      Past Medical History:  Diagnosis Date  . Anemia   . Anxiety  Dx 2008  . Diabetes mellitus without complication ([place]) Dx 1941  . GERD (gastroesophageal reflux disease) Dx 2008  . Headache(784.0)   . Pneumonia   . Seizures ([place]) 2011    x 2 in lifetime. on Dilantin for a while.   . 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 Hyperglycemia 12/16/2016  . Sepsis ([place]) 12/16/2016  . Cocaine abuse 08/16/2015  . Diabetic neuropathy, type II diabetes mellitus ([place]) 08/16/2015  . Cellulitis 12/03/2014  . Hyponatremia 12/03/2014  . AKI (acute kidney injury) ([place]) 12/03/2014  . Left leg pain 12/03/2014  . Elevated liver enzymes 07/26/2014  . Dental caries 07/23/2014  . Tinea pedis 07/23/2014  . Scars 07/23/2014  . Drug abuse, IV 10/11/2012  . Tobacco abuse 10/11/2012  . DM type 2, uncontrolled, with renal complications ([place]) 74/10/1446  . HTN (hypertension) 10/11/2012    Past Surgical History:  Procedure Laterality Date  . I&D EXTREMITY Left 10/11/2012   Procedure: IRRIGATION AND DEBRIDEMENT ABSCESS FOREARM;  Surgeon: Linna Hoff, MD;  Location: [facility];  Service: Orthopedics;  Laterality: Left;  . I&D EXTREMITY Left 10/12/2012   Procedure: IRRIGATION AND DEBRIDEMENT FOREARM;  Surgeon: Linna Hoff, MD;  Location:  [facility];  Service: Orthopedics;  Laterality: Left;  . I&D EXTREMITY Left 10/14/2012   Procedure: incision and drainage left forearm;  Surgeon: Linna Hoff, MD;  Location: [facility];  Service: Orthopedics;  Laterality: Left;  . I&D EXTREMITY Left 10/16/2012   Procedure: IRRIGATION AND DEBRIDEMENT LEFT FOREARM;  Surgeon: Linna Hoff, MD;  Location: [facility];  Service: Orthopedics;  Laterality: Left;  . I&D EXTREMITY Left 10/20/2012   Procedure: INCISION AND DRAINAGE AND DEBRIDEMENT LEFT  FOREARM;  Surgeon: Linna Hoff, MD;  Location: [facility];  Service: Orthopedics;  Laterality: Left;  . IRRIGATION AND DEBRIDEMENT ABSCESS     Hx: of left arm abscess related to drug use        Home Medications    Prior to Admission medications   Medication Sig Start Date End Date Taking? Authorizing Provider  insulin aspart (NOVOLOG) 100 UNIT/ML injection INJECT 5 UNITS INTO THE SKIN 3 TIMES DAILY WITH MEALS. PER SLIDING SCALE 10/12/16  Yes Funches, Josalyn, MD  insulin NPH-regular Human (HUMULIN 70/30) (70-30) 100 UNIT/ML injection Inject subcutaneously 50 U in the morning and 40 Units in the evening 04/19/16  Yes Funches, Josalyn, MD  lisinopril (PRINIVIL,ZESTRIL) 10 MG tablet TAKE 1 TABLET BY MOUTH DAILY. **MUST HAVE OFFICE VISIT FOR REFILLS** 03/23/16  Yes Funches, Josalyn, MD  ranitidine (ZANTAC) 150 MG tablet Take 150 mg by mouth 2 (two) times daily.   Yes [provider]  SUBOXONE 8-2 MG FILM  Inject 1 Film into the lesion 2 (two) times daily. 12/12/16  Yes [provider]  Blood Glucose Monitoring Suppl (TRUE METRIX METER) DEVI 1 kit by Does not apply route as directed. Use as directed 06/07/15   Boykin Nearing, MD  gabapentin (NEURONTIN) 100 MG capsule Take 1 capsule (100 mg total) by mouth 3 (three) times daily. Need office visit for additional refills Patient not taking: Reported on 12/16/2016 07/31/16   Boykin Nearing, MD  glucose blood test strip Use as instructed 08/20/16   Funches, Adriana Mccallum,  MD  insulin NPH Human (HUMULIN N) 100 UNIT/ML injection Inject 50 units in the A.M and 40 units in the P.M. Patient not taking: Reported on 12/16/2016 04/19/16   Boykin Nearing, MD  Insulin Pen Needle (PEN NEEDLES 3/16") 31G X 5 MM MISC 1 each by Does not apply route 4 (four) times daily. 08/16/15   Funches, Adriana Mccallum, MD  Insulin Syringe-Needle U-100 (TRUEPLUS INSULIN SYRINGE) 30G X 5/16" 0.5 ML MISC 1 each by Other route 2 (two) times daily. 10/31/15   Funches, Adriana Mccallum, MD  Insulin Syringes, Disposable, U-100 0.5 ML MISC 1 each by Does not apply route 2 (two) times daily. 10/31/15   Boykin Nearing, MD  Lancet Devices [facility]) lancets 1 each by Other route 3 (three) times daily. 07/23/14   Boykin Nearing, MD  TRUEPLUS INSULIN SYRINGE 31G X 5/16" 0.5 ML MISC USE AS DIRECTED TWICE DAILY 08/20/16   Boykin Nearing, MD  TRUEPLUS LANCETS 28G MISC Use as directed 06/07/15   Boykin Nearing, MD    Family History Family History  Problem Relation Age of Onset  . Diabetes Father   . Heart disease Father   . Mental illness Sister   . Cancer Neg Hx     Social History Social History  Substance Use Topics  . Smoking status: Current Every Day Smoker    Packs/day: 1.00    Types: Cigarettes  . Smokeless tobacco: Never Used  . Alcohol use No     Comment: occasional     Allergies   Patient has no known allergies.   Review of Systems Review of Systems  Constitutional: Negative for chills and fever.  HENT: Negative for congestion.   Eyes: Negative for visual disturbance.  Respiratory: Negative for shortness of breath.   Cardiovascular: Negative for chest pain.  Gastrointestinal: Negative for abdominal pain and vomiting.  Genitourinary: Positive for difficulty urinating and dysuria. Negative for flank pain.  Musculoskeletal: Positive for back pain. Negative for neck pain and neck stiffness.  Skin: Positive for rash.  Neurological: Negative for weakness, light-headedness, numbness and  headaches.     Physical Exam Updated Vital Signs BP 127/82   Pulse 100   Temp 100.3 F (37.9 C) (Oral)   Resp 16   Ht _0  (1.753 m)   Wt 86.2 kg (190 lb)   SpO2 95%   BMI 28.06 kg/m   Physical Exam  Constitutional: He appears well-developed and well-nourished.  HENT:  Head: Normocephalic and atraumatic.  Eyes: Conjunctivae are normal.  Neck: Neck supple.  Cardiovascular: Normal rate.   Pulmonary/Chest: Effort normal.  Abdominal: Soft. There is no tenderness.  Genitourinary:  Genitourinary Comments: Normal rectal tone with tender prostate palpation.  Musculoskeletal: He exhibits no edema.  Neurological: He is alert.  Skin: Skin is warm and dry. Rash noted.  Patient has multiple regions and wounds from history of IV drug abuse. Multiple scars without active areas of infection on both arms.  Psychiatric: He has  a normal mood and affect.  Nursing note and vitals reviewed.    ED Treatments / Results  Labs (all labs ordered are listed, but only abnormal results are displayed) Labs Reviewed  URINALYSIS, ROUTINE W REFLEX MICROSCOPIC - Abnormal; Notable for the following:       Result Value   Color, Urine AMBER (*)    APPearance CLOUDY (*)    Hgb urine dipstick SMALL (*)    Ketones, ur 5 (*)    Protein, ur 100 (*)    Leukocytes, UA LARGE (*)    Bacteria, UA RARE (*)    All other components within normal limits  CBC WITH DIFFERENTIAL/PLATELET - Abnormal; Notable for the following:    WBC 26.0 (*)    RBC 4.16 (*)    Hemoglobin 11.6 (*)    HCT 34.4 (*)    Neutro Abs 19.5 (*)    Monocytes Absolute 2.3 (*)    All other components within normal limits  BASIC METABOLIC PANEL - Abnormal; Notable for the following:    Sodium 128 (*)    Chloride 95 (*)    Glucose, Bld 534 (*)    Creatinine, Ser 1.51 (*)    Calcium 8.6 (*)    GFR calc non Af Amer 58 (*)    All other components within normal limits  CBG MONITORING, ED - Abnormal; Notable for the following:     Glucose-Capillary 221 (*)    All other components within normal limits  I-STAT VENOUS BLOOD GAS, ED - Abnormal; Notable for the following:    pH, Ven 7.443 (*)    pCO2, Ven 40.0 (*)    pO2, Ven 46.0 (*)    Acid-Base Excess 3.0 (*)    All other components within normal limits  CBG MONITORING, ED - Abnormal; Notable for the following:    Glucose-Capillary >600 (*)    All other components within normal limits  URINE CULTURE  CULTURE, BLOOD (ROUTINE X 2)  CULTURE, BLOOD (ROUTINE X 2)  BLOOD GAS, VENOUS  I-STAT CG4 LACTIC ACID, ED    EKG  EKG Interpretation None       Radiology Ct Abdomen Pelvis W Contrast  Result Date: 12/16/2016 CLINICAL DATA:  Urinary retention and rectal pain with lower back pain for 1 week. Fever. Suspect abscess. History of gastroesophageal reflux disease, IV drug abuse, and diabetes. EXAM: CT ABDOMEN AND PELVIS WITH CONTRAST TECHNIQUE: Multidetector CT imaging of the abdomen and pelvis was performed using the standard protocol following bolus administration of intravenous contrast. CONTRAST:  194m ISOVUE-300 IOPAMIDOL (ISOVUE-300) INJECTION 61% COMPARISON:  04/11/2014 FINDINGS: Lower chest: Mild dependent changes in the lung bases. Hepatobiliary: No focal liver abnormality is seen. No gallstones, gallbladder wall thickening, or biliary dilatation. Pancreas: Unremarkable. No pancreatic ductal dilatation or surrounding inflammatory changes. Spleen: Normal in size without focal abnormality. Adrenals/Urinary Tract: No adrenal gland nodules. Punctate size calcifications in the right kidney. No hydronephrosis or hydroureter. No ureteral stones identified. Bladder wall is not thickened and no bladder stones are identified. Stomach/Bowel: Stomach and small bowel are decompressed. Stool throughout the colon. No colonic wall thickening or distention. The appendix is normal. There is a fluid collection demonstrated anterior and to the left of the anus, extending from about the 12  o'clock to the 2 o'clock position. The fluid collection measures about 2.2 cm diameter. This is consistent with an abscess. The abscess appears to be within the prostate gland but could be in the perianal tissues. Vascular/Lymphatic: No significant vascular findings  are present. No enlarged abdominal or pelvic lymph nodes. Reproductive: Prostate gland is not enlarged. Possible fluid collection within the left prostate. See above. Other: No free air or free fluid in the abdomen. Abdominal wall musculature appears intact. Musculoskeletal: Normal alignment of the lumbar spine. Old endplate compression deformities of T10 and T11. IMPRESSION: 1. 2.2 cm circumscribed fluid collection consistent with an abscess. This may be prostatic or anterior perianal. 2. Nonobstructing intrarenal stones in the right kidney. Electronically Signed   By: Lucienne Capers M.D.   On: 12/16/2016 22:40    Procedures .Critical Care Performed by: Elnora Morrison Authorized by: Elnora Morrison   Critical care provider statement:    Critical care time (minutes):  40   Critical care start time:  12/16/2016 10:22 PM   Critical care end time:  12/16/2016 11:02 PM   Critical care was necessary to treat or prevent imminent or life-threatening deterioration of the following conditions:  Endocrine crisis and metabolic crisis   Critical care was time spent personally by me on the following activities:  Ordering and review of laboratory studies, ordering and review of radiographic studies, discussions with consultants and examination of patient   I assumed direction of critical care for this patient from another provider in my specialty: no     (including critical care time) EMERGENCY DEPARTMENT ULTRASOUND  Study: Limited Ultrasound of Bladder  INDICATIONS: to assess for urinary retention and/or bladder volume prior to urinary catheter Multiple views of the bladder were obtained in real-time in the transverse and longitudinal planes with  a multi-frequency probe.  PERFORMED BY: Myself IMAGES ARCHIVED?: Yes LIMITATIONS:  none INTERPRETATION: Moderate Volume   Emergency Ultrasound Study:   Angiocath insertion Performed by: Mariea Clonts  Consent: Verbal consent obtained. Risks and benefits: risks, benefits and alternatives were discussed Immediately prior to procedure the correct patient, procedure, equipment, support staff and site/side marked as needed.  Indication: difficult IV access Preparation: Patient was prepped and draped in the usual sterile fashion. Vein Location: right basilic vein was visualized during assessment for potential access sites and was found to be patent/ easily compressed with linear ultrasound.  The needle was visualized with real-time ultrasound and guided into the vein. Gauge: 18 g  Image saved and stored.  Normal blood return.  Patient tolerance: Patient tolerated the procedure well with no immediate complications.    Medications Ordered in ED Medications  0.9 %  sodium chloride infusion ( Intravenous New Bag/Given 12/16/16 2256)  insulin regular (NOVOLIN R,HUMULIN R) 100 Units in sodium chloride 0.9 % 100 mL (1 Units/mL) infusion (5.4 Units/hr Intravenous New Bag/Given 12/16/16 2258)  piperacillin-tazobactam (ZOSYN) IVPB 3.375 g (not administered)  ciprofloxacin (CIPRO) tablet 500 mg (500 mg Oral Given 12/16/16 2005)  sodium chloride 0.9 % bolus 1,000 mL (1,000 mLs Intravenous New Bag/Given 12/16/16 2005)  acetaminophen (TYLENOL) tablet 1,000 mg (1,000 mg Oral Given 12/16/16 2005)  iopamidol (ISOVUE-300) 61 % injection 100 mL (100 mLs Intravenous Contrast Given 12/16/16 2148)     Initial Impression / Assessment and Plan / ED Course  I have reviewed the triage vital signs and the nursing notes.  Pertinent labs & imaging results that were available during my care of the patient were reviewed by me and considered in my medical decision making (see chart for details).    Patient  presents with urinary retention/dysuria and clinical concern for prostatitis. Discussed importance of antibiotics, sugar control and close outpatient follow-up with his IV drug use history and uncontrolled diabetes.  Plan for IV fluids, pain meds, antibiotics, blood work.  Patient's blood work showed significant uncontrolled diabetes with sugars current 600. IV fluid bolus ordered. Lactic acid ordered. Cultures and CT scan for further delineation with white count 26,000 and persistent pain near the prostate.  CT scan revealed 2 cm abscess. Discussed with urology on call Dr. Vikki Ports who will see the patient to moral and agrees with IV antibiotics and admission to the hospitalist. Discussed with hospitalist for addition stepdown with patient's Koepke did diabetes, abscess and IV drug use history.  The patients results and plan were reviewed and discussed.   Any x-rays performed were independently reviewed by myself.   Differential diagnosis were considered with the presenting HPI.  Medications  0.9 %  sodium chloride infusion ( Intravenous New Bag/Given 12/16/16 2256)  insulin regular (NOVOLIN R,HUMULIN R) 100 Units in sodium chloride 0.9 % 100 mL (1 Units/mL) infusion (5.4 Units/hr Intravenous New Bag/Given 12/16/16 2258)  piperacillin-tazobactam (ZOSYN) IVPB 3.375 g (not administered)  ciprofloxacin (CIPRO) tablet 500 mg (500 mg Oral Given 12/16/16 2005)  sodium chloride 0.9 % bolus 1,000 mL (1,000 mLs Intravenous New Bag/Given 12/16/16 2005)  acetaminophen (TYLENOL) tablet 1,000 mg (1,000 mg Oral Given 12/16/16 2005)  iopamidol (ISOVUE-300) 61 % injection 100 mL (100 mLs Intravenous Contrast Given 12/16/16 2148)    Vitals:   12/16/16 2000 12/16/16 2114 12/16/16 2115 12/16/16 2123  BP: (!) 152/84  127/82   Pulse: (!) 114  100   Resp: _0 Temp:    100.3 F (37.9 C)  TempSrc:    Oral  SpO2: 98%  95%   Weight:      Height:        Final diagnoses:  Type 2 diabetes mellitus with  hyperglycemia, with long-term current use of insulin ([name])  Prostatitis, acute  Urinary retention  Abscess of prostate    Admission/ observation were discussed with the admitting physician, patient and/or family and they are comfortable with the plan.      Final Clinical Impressions(s) / ED Diagnoses   Final diagnoses:  Type 2 diabetes mellitus with hyperglycemia, with long-term current use of insulin ([place])  Prostatitis, acute  Urinary retention  Abscess of prostate    New Prescriptions New Prescriptions   No medications on file     Elnora Morrison, MD 12/16/16 2323

## 2016-12-16 NOTE — Progress Notes (Signed)
Pharmacy Antibiotic Note  Luke [name] is a 36 y.o. male admitted on 12/16/2016 with prostatic vs perianal abscess.  Pharmacy has been consulted for Vancomycin/Zosyn dosing. Hx of diabetes/IVDA. WBC elevated. Noted renal dysfunction.   Plan: Vancomycin 1000 mg IV q12h Zosyn 3.375G IV q8h to be infused over 4 hours Trend WBC, temp, renal function  F/U infectious work-up Drug levels as indicated   Height: 5\' 9"  (175.3 cm) Weight: 190 lb (86.2 kg) IBW/kg (Calculated) : 70.7  Temp (24hrs), Avg:99.7 F (37.6 C), Min:99.2 F (37.3 C), Max:100.3 F (37.9 C)   Recent Labs Lab 12/16/16 1947 12/16/16 2009  WBC 26.0*  --   CREATININE 1.51*  --   LATICACIDVEN  --  1.71    Estimated Creatinine Clearance: 73.6 mL/min (A) (by C-G formula based on SCr of 1.51 mg/dL (H)).    No Known Allergies  Narda Bonds 12/16/2016 11:46 PM

## 2016-12-16 NOTE — H&P (Addendum)
TRH H&P   Patient Demographics:    Luke [name], is a 36 y.o. male  MRN: [id number]   DOB - Sep 21, 1980  Admit Date - 12/16/2016  Outpatient Primary MD for the patient is Boykin Nearing, MD  Referring MD/NP/PA: Elnora Morrison  Outpatient Specialists:   Patient coming from: home  Chief Complaint  Patient presents with  . Urinary Retention      HPI:    Luke [name]  is a 36 y.o. male, w heroin abuse, seizures, dm2 who presents with c/o urinary retension starting this am.  Pt wasn't able to urinate and presented to ED for evaluation.  Denies dysuria, hematuria.   In Ed, T 100.3,  P 133, Bp 124/56.  Wbc 26.0, Hgb 11.6, Plt 372,  Na 128, K 5.1,  Bun/Creat 16/1.51  Glucose 534.  Pt will be admitted for hyperglycemia, ARF, urinary retention and fever.     Review of systems:    In addition to the HPI above,   No Headache, No changes with Vision or hearing, No problems swallowing food or Liquids, No Chest pain, Cough or Shortness of Breath, No Abdominal pain, No Nausea or Vommitting, Bowel movements are regular, No Blood in stool or Urine, No dysuria, No new skin rashes or bruises, No new joints pains-aches,  No new weakness, tingling, numbness in any extremity, No recent weight gain or loss, No polyuria, polydypsia or polyphagia, No significant Mental Stressors.  A full 10 point Review of Systems was done, except as stated above, all other Review of Systems were negative.   With Past History of the following :    Past Medical History:  Diagnosis Date  . Anemia   . Anxiety  Dx 2008  . Diabetes mellitus without complication ([place]) Dx 4403  . GERD (gastroesophageal reflux disease) Dx 2008  . Headache(784.0)   . Pneumonia   . Seizures ([place]) 2011    x 2 in lifetime. on Dilantin for a while.   . Substance abuse 2013    heroin use, multiple relapses      Past Surgical  History:  Procedure Laterality Date  . I&D EXTREMITY Left 10/11/2012   Procedure: IRRIGATION AND DEBRIDEMENT ABSCESS FOREARM;  Surgeon: Linna Hoff, MD;  Location: [place];  Service: Orthopedics;  Laterality: Left;  . I&D EXTREMITY Left 10/12/2012   Procedure: IRRIGATION AND DEBRIDEMENT FOREARM;  Surgeon: Linna Hoff, MD;  Location: [place];  Service: Orthopedics;  Laterality: Left;  . I&D EXTREMITY Left 10/14/2012   Procedure: incision and drainage left forearm;  Surgeon: Linna Hoff, MD;  Location: [place];  Service: Orthopedics;  Laterality: Left;  . I&D EXTREMITY Left 10/16/2012   Procedure: IRRIGATION AND DEBRIDEMENT LEFT FOREARM;  Surgeon: Linna Hoff, MD;  Location: [place];  Service: Orthopedics;  Laterality: Left;  . I&D EXTREMITY Left 10/20/2012   Procedure: INCISION AND DRAINAGE AND  DEBRIDEMENT LEFT  FOREARM;  Surgeon: Linna Hoff, MD;  Location: [place];  Service: Orthopedics;  Laterality: Left;  . IRRIGATION AND DEBRIDEMENT ABSCESS     Hx: of left arm abscess related to drug use       Social History:     Social History  Substance Use Topics  . Smoking status: Current Every Day Smoker    Packs/day: 1.00    Types: Cigarettes  . Smokeless tobacco: Never Used  . Alcohol use No     Comment: occasional     Lives -  At home Mobility -  Walks by self  Family History :     Family History  Problem Relation Age of Onset  . Diabetes Father   . Heart disease Father   . Mental illness Sister   . Cancer Neg Hx      Home Medications:   Prior to Admission medications   Medication Sig Start Date End Date Taking? Authorizing Provider  insulin aspart (NOVOLOG) 100 UNIT/ML injection INJECT 5 UNITS INTO THE SKIN 3 TIMES DAILY WITH MEALS. PER SLIDING SCALE 10/12/16  Yes Funches, Josalyn, MD  insulin NPH-regular Human (HUMULIN 70/30) (70-30) 100 UNIT/ML injection Inject subcutaneously 50 U in the morning and 40 Units in the evening 04/19/16  Yes Funches, Josalyn, MD  lisinopril  (PRINIVIL,ZESTRIL) 10 MG tablet TAKE 1 TABLET BY MOUTH DAILY. **MUST HAVE OFFICE VISIT FOR REFILLS** 03/23/16  Yes Funches, Josalyn, MD  ranitidine (ZANTAC) 150 MG tablet Take 150 mg by mouth 2 (two) times daily.   Yes [provider]  SUBOXONE 8-2 MG FILM Inject 1 Film into the lesion 2 (two) times daily. 12/12/16  Yes [provider]  Blood Glucose Monitoring Suppl (TRUE METRIX METER) DEVI 1 kit by Does not apply route as directed. Use as directed 06/07/15   Boykin Nearing, MD  gabapentin (NEURONTIN) 100 MG capsule Take 1 capsule (100 mg total) by mouth 3 (three) times daily. Need office visit for additional refills Patient not taking: Reported on 12/16/2016 07/31/16   Boykin Nearing, MD  glucose blood test strip Use as instructed 08/20/16   Funches, Adriana Mccallum, MD  insulin NPH Human (HUMULIN N) 100 UNIT/ML injection Inject 50 units in the A.M and 40 units in the P.M. Patient not taking: Reported on 12/16/2016 04/19/16   Boykin Nearing, MD  Insulin Pen Needle (PEN NEEDLES 3/16") 31G X 5 MM MISC 1 each by Does not apply route 4 (four) times daily. 08/16/15   Funches, Adriana Mccallum, MD  Insulin Syringe-Needle U-100 (TRUEPLUS INSULIN SYRINGE) 30G X 5/16" 0.5 ML MISC 1 each by Other route 2 (two) times daily. 10/31/15   Funches, Adriana Mccallum, MD  Insulin Syringes, Disposable, U-100 0.5 ML MISC 1 each by Does not apply route 2 (two) times daily. 10/31/15   Boykin Nearing, MD  Lancet Devices [facility]) lancets 1 each by Other route 3 (three) times daily. 07/23/14   Boykin Nearing, MD  TRUEPLUS INSULIN SYRINGE 31G X 5/16" 0.5 ML MISC USE AS DIRECTED TWICE DAILY 08/20/16   Boykin Nearing, MD  TRUEPLUS LANCETS 28G MISC Use as directed 06/07/15   Boykin Nearing, MD     Allergies:    No Known Allergies   Physical Exam:   Vitals  Blood pressure 139/81, pulse 100, temperature 99.6 F (37.6 C), temperature source Oral, resp. rate 16, height '5\' 9"'$  (1.753 m), weight 86.2 kg (190 lb), SpO2 95  %.   1. General  lying in bed in NAD,  2. Normal affect and insight, Not Suicidal or Homicidal, Awake Alert, Oriented X 3.  3. No F.N deficits, ALL C.Nerves Intact, Strength 5/5 all 4 extremities, Sensation intact all 4 extremities, Plantars down going.  4. Ears and Eyes appear Normal, Conjunctivae clear, PERRLA. Moist Oral Mucosa.  5. Supple Neck, No JVD, No cervical lymphadenopathy appriciated, No Carotid Bruits.  6. Symmetrical Chest wall movement, Good air movement bilaterally, CTAB.  7. RRR, No Gallops, Rubs or Murmurs, No Parasternal Heave.  8. Positive Bowel Sounds, Abdomen Soft, No tenderness, No organomegaly appriciated,No rebound -guarding or rigidity.  9.  No Cyanosis, Normal Skin Turgor, No Skin Rash or Bruise.  10. Good muscle tone,  joints appear normal , no effusions, Normal ROM.  11. No Palpable Lymph Nodes in Neck or Axillae  No splinter, no janeway, no osler.    Data Review:    CBC  Recent Labs Lab 12/16/16 1947  WBC 26.0*  HGB 11.6*  HCT 34.4*  PLT 372  MCV 82.7  MCH 27.9  MCHC 33.7  RDW 13.4  LYMPHSABS 3.9  MONOABS 2.3*  EOSABS 0.3  BASOSABS 0.0   ------------------------------------------------------------------------------------------------------------------  Chemistries   Recent Labs Lab 12/16/16 1947  NA 128*  K 5.1  CL 95*  CO2 22  GLUCOSE 534*  BUN 16  CREATININE 1.51*  CALCIUM 8.6*   ------------------------------------------------------------------------------------------------------------------ estimated creatinine clearance is 73.6 mL/min (A) (by C-G formula based on SCr of 1.51 mg/dL (H)). ------------------------------------------------------------------------------------------------------------------ No results for input(s): TSH, T4TOTAL, T3FREE, THYROIDAB in the last 72 hours.  Invalid input(s): FREET3  Coagulation profile No results for input(s): INR, PROTIME in the last 168  hours. ------------------------------------------------------------------------------------------------------------------- No results for input(s): DDIMER in the last 72 hours. -------------------------------------------------------------------------------------------------------------------  Cardiac Enzymes No results for input(s): CKMB, TROPONINI, MYOGLOBIN in the last 168 hours.  Invalid input(s): CK ------------------------------------------------------------------------------------------------------------------ No results found for: BNP   ---------------------------------------------------------------------------------------------------------------  Urinalysis    Component Value Date/Time   COLORURINE AMBER (A) 12/16/2016 1556   APPEARANCEUR CLOUDY (A) 12/16/2016 1556   APPEARANCEUR Clear 04/11/2014 0138   LABSPEC 1.025 12/16/2016 1556   LABSPEC 1.026 04/11/2014 0138   PHURINE 5.0 12/16/2016 1556   GLUCOSEU NEGATIVE 12/16/2016 1556   GLUCOSEU >=500 04/11/2014 0138   HGBUR SMALL (A) 12/16/2016 1556   BILIRUBINUR NEGATIVE 12/16/2016 1556   BILIRUBINUR negative 10/31/2015 1236   BILIRUBINUR Negative 04/11/2014 0138   KETONESUR 5 (A) 12/16/2016 1556   PROTEINUR 100 (A) 12/16/2016 1556   UROBILINOGEN 0.2 10/31/2015 1236   UROBILINOGEN 0.2 12/10/2013 1221   NITRITE NEGATIVE 12/16/2016 1556   LEUKOCYTESUR LARGE (A) 12/16/2016 1556   LEUKOCYTESUR Negative 04/11/2014 0138    ----------------------------------------------------------------------------------------------------------------   Imaging Results:    Ct Abdomen Pelvis W Contrast  Result Date: 12/16/2016 CLINICAL DATA:  Urinary retention and rectal pain with lower back pain for 1 week. Fever. Suspect abscess. History of gastroesophageal reflux disease, IV drug abuse, and diabetes. EXAM: CT ABDOMEN AND PELVIS WITH CONTRAST TECHNIQUE: Multidetector CT imaging of the abdomen and pelvis was performed using the standard  protocol following bolus administration of intravenous contrast. CONTRAST:  185m ISOVUE-300 IOPAMIDOL (ISOVUE-300) INJECTION 61% COMPARISON:  04/11/2014 FINDINGS: Lower chest: Mild dependent changes in the lung bases. Hepatobiliary: No focal liver abnormality is seen. No gallstones, gallbladder wall thickening, or biliary dilatation. Pancreas: Unremarkable. No pancreatic ductal dilatation or surrounding inflammatory changes. Spleen: Normal in size without focal abnormality. Adrenals/Urinary Tract: No adrenal gland nodules. Punctate size calcifications in the right kidney. No hydronephrosis or hydroureter. No ureteral  stones identified. Bladder wall is not thickened and no bladder stones are identified. Stomach/Bowel: Stomach and small bowel are decompressed. Stool throughout the colon. No colonic wall thickening or distention. The appendix is normal. There is a fluid collection demonstrated anterior and to the left of the anus, extending from about the 12 o'clock to the 2 o'clock position. The fluid collection measures about 2.2 cm diameter. This is consistent with an abscess. The abscess appears to be within the prostate gland but could be in the perianal tissues. Vascular/Lymphatic: No significant vascular findings are present. No enlarged abdominal or pelvic lymph nodes. Reproductive: Prostate gland is not enlarged. Possible fluid collection within the left prostate. See above. Other: No free air or free fluid in the abdomen. Abdominal wall musculature appears intact. Musculoskeletal: Normal alignment of the lumbar spine. Old endplate compression deformities of T10 and T11. IMPRESSION: 1. 2.2 cm circumscribed fluid collection consistent with an abscess. This may be prostatic or anterior perianal. 2. Nonobstructing intrarenal stones in the right kidney. Electronically Signed   By: Lucienne Capers M.D.   On: 12/16/2016 22:40     Assessment & Plan:    Active Problems:   Hyperglycemia   Sepsis ([place])    Tachycardia   Fever   ARF (acute renal failure) ([name])   Anemia    Fever ? Prostate abscess, early sepsis Blood culture x2 ESR Cardiac echo vanco iv, zosyn iv pharmacy to dose  Tachycardia secondary to ? Fever Check trop iq6h x3 Check tsh Check cardiac echo  ARF Hydrate with ns iv Check cmp in am  Leukocytosis  Check cbc in am  Anemia Repeat cbc in am  Hyponatremia Hydrate with ns iv Check cmp in am  Dm2 fsbs q4h, ISS      DVT Prophylaxis Lovenox - SCDs  AM Labs Ordered, also please review Full Orders  Family Communication: Admission, patients condition and plan of care including tests being ordered have been discussed with the patient who indicate understanding and agree with the plan and Code Status.  Code Status FULL CODE  Likely DC to  home  Condition GUARDED    Consults called: none  Admission status: inpatient  Time spent in minutes : 45 minutes   Jani Gravel M.D on 12/16/2016 at 11:53 PM  Between 7am to 7pm - Pager - [phone]. After 7pm go to [email or web address] - password [facility]  [facility] - Office  [phone]

## 2016-12-16 NOTE — ED Notes (Signed)
Don't need 2nd istat lactic acid on pt..per nurse.

## 2016-12-16 NOTE — ED Triage Notes (Addendum)
Pt presents for evaluation of urinary rentention with lower back pain x 1 week. Pt had DM1, reports brittle diabetic with elevated sugars over past few weeks as well. Pt reports pain to rectum. Hx of IV drug use with multiple poorly healing areas to arms, reports in drug treatment, last used "weeks ago" per patient.

## 2016-12-16 NOTE — ED Notes (Signed)
Nurse currently starting IV and will get one set of blood cultures.

## 2016-12-17 ENCOUNTER — Inpatient Hospital Stay (HOSPITAL_COMMUNITY): Payer: Self-pay

## 2016-12-17 DIAGNOSIS — I361 Nonrheumatic tricuspid (valve) insufficiency: Secondary | ICD-10-CM

## 2016-12-17 DIAGNOSIS — I351 Nonrheumatic aortic (valve) insufficiency: Secondary | ICD-10-CM

## 2016-12-17 DIAGNOSIS — R0600 Dyspnea, unspecified: Secondary | ICD-10-CM

## 2016-12-17 DIAGNOSIS — R509 Fever, unspecified: Secondary | ICD-10-CM

## 2016-12-17 DIAGNOSIS — R079 Chest pain, unspecified: Secondary | ICD-10-CM

## 2016-12-17 DIAGNOSIS — N412 Abscess of prostate: Secondary | ICD-10-CM | POA: Diagnosis present

## 2016-12-17 DIAGNOSIS — D649 Anemia, unspecified: Secondary | ICD-10-CM

## 2016-12-17 DIAGNOSIS — R000 Tachycardia, unspecified: Secondary | ICD-10-CM

## 2016-12-17 DIAGNOSIS — F1910 Other psychoactive substance abuse, uncomplicated: Secondary | ICD-10-CM

## 2016-12-17 DIAGNOSIS — A419 Sepsis, unspecified organism: Principal | ICD-10-CM

## 2016-12-17 LAB — GLUCOSE, CAPILLARY
GLUCOSE-CAPILLARY: 405 mg/dL — AB (ref 65–99)
GLUCOSE-CAPILLARY: 214 mg/dL — AB (ref 65–99)
GLUCOSE-CAPILLARY: 199 mg/dL — AB (ref 65–99)
Glucose-Capillary: 428 mg/dL — ABNORMAL HIGH (ref 65–99)
Glucose-Capillary: 165 mg/dL — ABNORMAL HIGH (ref 65–99)
Glucose-Capillary: 182 mg/dL — ABNORMAL HIGH (ref 65–99)

## 2016-12-17 LAB — COMPREHENSIVE METABOLIC PANEL
ALT: 15 U/L — AB (ref 17–63)
AST: 16 U/L (ref 15–41)
Albumin: 2.8 g/dL — ABNORMAL LOW (ref 3.5–5.0)
Alkaline Phosphatase: 130 U/L — ABNORMAL HIGH (ref 38–126)
Anion gap: 10 (ref 5–15)
BUN: 15 mg/dL (ref 6–20)
CHLORIDE: 101 mmol/L (ref 101–111)
CO2: 20 mmol/L — ABNORMAL LOW (ref 22–32)
CREATININE: 1.46 mg/dL — AB (ref 0.61–1.24)
Calcium: 8.5 mg/dL — ABNORMAL LOW (ref 8.9–10.3)
GFR calc Af Amer: >60 mL/min (ref >60)
GFR calc non Af Amer: >60 mL/min (ref >60)
GLUCOSE: 403 mg/dL — AB (ref 65–99)
POTASSIUM: 4.3 mmol/L (ref 3.5–5.1)
Sodium: 131 mmol/L — ABNORMAL LOW (ref 135–145)
Total Bilirubin: 1.1 mg/dL (ref 0.3–1.2)
Total Protein: 5.8 g/dL — ABNORMAL LOW (ref 6.5–8.1)

## 2016-12-17 LAB — CBC
HEMATOCRIT: 31.3 % — AB (ref 39.0–52.0)
Hemoglobin: 10.2 g/dL — ABNORMAL LOW (ref 13.0–17.0)
MCH: 26.8 pg (ref 26.0–34.0)
MCHC: 32.6 g/dL (ref 30.0–36.0)
MCV: 82.2 fL (ref 78.0–100.0)
Platelets: 380 10*3/uL (ref 150–400)
RBC: 3.81 MIL/uL — ABNORMAL LOW (ref 4.22–5.81)
RDW: 13.4 % (ref 11.5–15.5)
WBC: 22.4 10*3/uL — ABNORMAL HIGH (ref 4.0–10.5)

## 2016-12-17 LAB — TROPONIN I
Troponin I: <0.03 ng/mL (ref <0.03)
Troponin I: <0.03 ng/mL (ref <0.03)

## 2016-12-17 LAB — MRSA PCR SCREENING: MRSA by PCR: POSITIVE — AB

## 2016-12-17 LAB — ECHOCARDIOGRAM COMPLETE
Height: 69 in
Weight: 2843.05 oz

## 2016-12-17 LAB — CK TOTAL AND CKMB (NOT AT ARMC)
CK TOTAL: 202 U/L (ref 49–397)
CK, MB: 3.6 ng/mL (ref 0.5–5.0)
RELATIVE INDEX: 1.8 (ref 0.0–2.5)

## 2016-12-17 LAB — D-DIMER, QUANTITATIVE (NOT AT ARMC): D DIMER QUANT: 0.57 ug{FEU}/mL — AB (ref 0.00–0.50)

## 2016-12-17 LAB — CBG MONITORING, ED: GLUCOSE-CAPILLARY: 423 mg/dL — AB (ref 65–99)

## 2016-12-17 LAB — HIV ANTIBODY (ROUTINE TESTING W REFLEX): HIV SCREEN 4TH GENERATION: NONREACTIVE

## 2016-12-17 LAB — HEMOGLOBIN A1C
Hgb A1c MFr Bld: 10.6 % — ABNORMAL HIGH (ref 4.8–5.6)
MEAN PLASMA GLUCOSE: 257.52 mg/dL

## 2016-12-17 MED ORDER — LIDOCAINE HCL 2 % EX GEL
1.0000 "application " | Freq: Once | CUTANEOUS | Status: AC
  Administered 2016-12-17: 1 via TOPICAL
  Filled 2016-12-17: qty 20

## 2016-12-17 MED ORDER — INSULIN GLARGINE 100 UNIT/ML ~~LOC~~ SOLN
30.0000 [IU] | Freq: Every day | SUBCUTANEOUS | Status: DC
  Administered 2016-12-17: 30 [IU] via SUBCUTANEOUS
  Filled 2016-12-17: qty 0.3

## 2016-12-17 MED ORDER — LISINOPRIL 10 MG PO TABS
10.0000 mg | ORAL_TABLET | Freq: Every day | ORAL | Status: DC
  Administered 2016-12-17: 10 mg via ORAL
  Filled 2016-12-17: qty 1

## 2016-12-17 MED ORDER — FAMOTIDINE 20 MG PO TABS
10.0000 mg | ORAL_TABLET | Freq: Two times a day (BID) | ORAL | Status: DC
  Administered 2016-12-17 – 2016-12-19 (×6): 10 mg via ORAL
  Filled 2016-12-17 (×6): qty 1

## 2016-12-17 MED ORDER — ENOXAPARIN SODIUM 30 MG/0.3ML ~~LOC~~ SOLN: 30.0000 mg | SUBCUTANEOUS | Status: DC

## 2016-12-17 MED ORDER — BUPRENORPHINE HCL 8 MG SL SUBL: 8.0000 mg | SUBLINGUAL_TABLET | Freq: Two times a day (BID) | SUBLINGUAL | Status: DC

## 2016-12-17 MED ORDER — ATORVASTATIN CALCIUM 80 MG PO TABS
80.0000 mg | ORAL_TABLET | Freq: Every day | ORAL | Status: DC
  Administered 2016-12-17 – 2016-12-18 (×2): 80 mg via ORAL
  Filled 2016-12-17 (×2): qty 1

## 2016-12-17 MED ORDER — ZOLPIDEM TARTRATE 5 MG PO TABS
10.0000 mg | ORAL_TABLET | Freq: Every evening | ORAL | Status: DC | PRN
  Administered 2016-12-17 – 2016-12-18 (×3): 10 mg via ORAL
  Filled 2016-12-17 (×3): qty 2

## 2016-12-17 MED ORDER — INSULIN ASPART 100 UNIT/ML ~~LOC~~ SOLN
10.0000 [IU] | Freq: Once | SUBCUTANEOUS | Status: AC
  Administered 2016-12-17: 10 [IU] via INTRAVENOUS

## 2016-12-17 MED ORDER — INSULIN ASPART 100 UNIT/ML ~~LOC~~ SOLN
0.0000 [IU] | SUBCUTANEOUS | Status: DC
  Administered 2016-12-17 (×2): 3 [IU] via SUBCUTANEOUS
  Administered 2016-12-17 (×2): 9 [IU] via SUBCUTANEOUS
  Administered 2016-12-17: 2 [IU] via SUBCUTANEOUS
  Administered 2016-12-18: 1 [IU] via SUBCUTANEOUS
  Administered 2016-12-18: 2 [IU] via SUBCUTANEOUS

## 2016-12-17 MED ORDER — TECHNETIUM TC 99M DIETHYLENETRIAME-PENTAACETIC ACID
32.0000 | Freq: Once | INTRAVENOUS | Status: AC
  Administered 2016-12-17: 32 via RESPIRATORY_TRACT

## 2016-12-17 MED ORDER — HYDROMORPHONE HCL 1 MG/ML IJ SOLN
1.0000 mg | Freq: Four times a day (QID) | INTRAMUSCULAR | Status: DC | PRN
  Administered 2016-12-17 – 2016-12-19 (×8): 1 mg via INTRAVENOUS
  Filled 2016-12-17 (×8): qty 1

## 2016-12-17 MED ORDER — ACETAMINOPHEN 325 MG PO TABS
650.0000 mg | ORAL_TABLET | Freq: Four times a day (QID) | ORAL | Status: DC | PRN
  Administered 2016-12-18: 650 mg via ORAL
  Filled 2016-12-17: qty 2

## 2016-12-17 MED ORDER — ACETAMINOPHEN 650 MG RE SUPP: 650.0000 mg | Freq: Four times a day (QID) | RECTAL | Status: DC | PRN

## 2016-12-17 MED ORDER — ENOXAPARIN SODIUM 100 MG/ML ~~LOC~~ SOLN
1.0000 mg/kg | Freq: Two times a day (BID) | SUBCUTANEOUS | Status: DC
  Administered 2016-12-17: 85 mg via SUBCUTANEOUS
  Filled 2016-12-17 (×4): qty 1

## 2016-12-17 MED ORDER — INSULIN ASPART PROT & ASPART (70-30 MIX) 100 UNIT/ML ~~LOC~~ SUSP
25.0000 [IU] | Freq: Two times a day (BID) | SUBCUTANEOUS | Status: DC
  Administered 2016-12-17: 25 [IU] via SUBCUTANEOUS
  Filled 2016-12-17 (×2): qty 10

## 2016-12-17 MED ORDER — TECHNETIUM TO 99M ALBUMIN AGGREGATED
4.2000 | Freq: Once | INTRAVENOUS | Status: AC
  Administered 2016-12-17: 4.2 via INTRAVENOUS

## 2016-12-17 MED ORDER — COLLAGENASE 250 UNIT/GM EX OINT
TOPICAL_OINTMENT | Freq: Every day | CUTANEOUS | Status: DC
  Administered 2016-12-17 – 2016-12-19 (×3): via TOPICAL
  Filled 2016-12-17 (×2): qty 30

## 2016-12-17 MED ORDER — INSULIN ASPART 100 UNIT/ML ~~LOC~~ SOLN: 0.0000 [IU] | Freq: Three times a day (TID) | SUBCUTANEOUS | Status: DC

## 2016-12-17 MED ORDER — BUPRENORPHINE HCL-NALOXONE HCL 8-2 MG SL SUBL
1.0000 | SUBLINGUAL_TABLET | Freq: Two times a day (BID) | SUBLINGUAL | Status: DC
  Administered 2016-12-17 – 2016-12-18 (×2): 1 via SUBLINGUAL
  Filled 2016-12-17 (×4): qty 1

## 2016-12-17 MED ORDER — SODIUM CHLORIDE 0.9 % IV SOLN
INTRAVENOUS | Status: DC
  Administered 2016-12-17: 09:00:00 via INTRAVENOUS

## 2016-12-17 NOTE — ED Notes (Signed)
Per nurse don't collect lactic acid plasma...last one was negative.

## 2016-12-17 NOTE — Progress Notes (Signed)
Patient ID: Luke [name], male   DOB: 07-Jan-1981, 36 y.o.   MRN: [id number]   Per RN trop elevated at 0.26 Pt denies coccaine use in the past 24-48 hours Has slight sharp left sided chest pain, lasting for a few sec, at a time. Intermittent.   Ekg nsr at 100, nl axis, j point st elevation in v1,2  A/P Troponin elevation Tele D dimer pending If positive then VQ scan Cont cycle trop Lovenox 1mg /kg [place] bid lipitor 80mg  po qhs D/w cardiology (Dr. Radford Pax) to review EKG,  ST elevation old.  Will consult by email for am consult

## 2016-12-17 NOTE — Progress Notes (Signed)
Verified with on-call MD to get VQ Scan in the AM and orders for sliding scale insulin with blood sugar checks every 4 hours.

## 2016-12-17 NOTE — Consult Note (Signed)
[place] Nurse wound consult note Reason for Consult: wounds from injections Patient with multiple sites, evidence of healed ulcerations over both arms Wound type: patient history of IVD use, with multiple full thickness wounds over the bilateral upper extremities The worst area is in the left antecubital space, which tunnels.  Open wounds over the inner upper arms, most with yellow slough Pressure Injury POA: No Measurement: Left inner antecubital space, area aproximately 6cm x 3cm with several small openings that track. 100% yellow slough base Right forarm: 1.5cm x 0.7cm x 0.2cm 100% yellow Right upper arm: several small areas each yellow base Wound bed: see above Drainage (amount, consistency, odor) minimal, no odor Periwound: both arms are edematous, and hardened.  Patient reports this as normal.  May want to consider imagining of the left arm, high risk for osteomyelitis and/or abscess  Dressing procedure/placement/frequency: Enzymatic debridement ointment to the wounds with necrotic bases.  Apply daily with saline damp 2x2 gauze. Top with kerlix, change daily.   Discussed POC with patient and bedside nurse.  Re consult if needed, will not follow at this time. Thanks  Justyn Langham [employer], RN,CWOCN, CNS, [place] [phone])

## 2016-12-17 NOTE — Consult Note (Signed)
Cardiology Consultation:   Patient ID: Luke [name]; [id number]; December 17, 1980   Admit date: 12/16/2016 Date of Consult: 12/17/2016  Primary Care Provider: Boykin Nearing, MD Primary Cardiologist: New/ Nishan Primary Electrophysiologist:  None   Patient Profile:   Luke [name] is a 36 y.o. male with a hx of IDDM, IV durg use  who is being seen today for the evaluation of chest pain  at the request of Rai.  History of Present Illness:   Mr. Besecker 36 y.o. admitted with prostatitis and likely abscess with sepsis. IV drug use with multiple full thickness wounds on arms. Denies chest pain to me. ER note indicated fleeting sharp left sided pains lasting seconds not related to exertion. WBC elevated 22 with CT showing fluid collection anterior to prostate. ECG is normal with J point elevation Troponin is negative x 3  Bedside echo being done personally reviewed Mild AR and normal LV size and function EF 65% no RWMA;s   Past Medical History:  Diagnosis Date  . Anemia   . Anxiety  Dx 2008  . Diabetes mellitus without complication ([place]) Dx 5053  . GERD (gastroesophageal reflux disease) Dx 2008  . Headache(784.0)   . Pneumonia   . Seizures ([place]) 2011    x 2 in lifetime. on Dilantin for a while.   . 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 I&D EXTREMITY Left 10/11/2012   Procedure: IRRIGATION AND DEBRIDEMENT ABSCESS FOREARM;  Surgeon: Linna Hoff, MD;  Location: [place];  Service: Orthopedics;  Laterality: Left;  . I&D EXTREMITY Left 10/12/2012   Procedure: IRRIGATION AND DEBRIDEMENT FOREARM;  Surgeon: Linna Hoff, MD;  Location: [place];  Service: Orthopedics;  Laterality: Left;  . I&D EXTREMITY Left 10/14/2012   Procedure: incision and drainage left forearm;  Surgeon: Linna Hoff, MD;  Location: [facility] [place];  Service: Orthopedics;  Laterality: Left;  . I&D EXTREMITY Left 10/16/2012   Procedure: IRRIGATION AND  DEBRIDEMENT LEFT FOREARM;  Surgeon: Linna Hoff, MD;  Location: [place];  Service: Orthopedics;  Laterality: Left;  . I&D EXTREMITY Left 10/20/2012   Procedure: INCISION AND DRAINAGE AND DEBRIDEMENT LEFT  FOREARM;  Surgeon: Linna Hoff, MD;  Location: [place];  Service: Orthopedics;  Laterality: Left;  . IRRIGATION AND DEBRIDEMENT ABSCESS     Hx: of left arm abscess related to drug use      Home Medications:  Prior to Admission medications   Medication Sig Start Date End Date Taking? Authorizing Provider  insulin aspart (NOVOLOG) 100 UNIT/ML injection INJECT 5 UNITS INTO THE SKIN 3 TIMES DAILY WITH MEALS. PER SLIDING SCALE 10/12/16  Yes Funches, Josalyn, MD  insulin NPH-regular Human (HUMULIN 70/30) (70-30) 100 UNIT/ML injection Inject subcutaneously 50 U in the morning and 40 Units in the evening 04/19/16  Yes Funches, Josalyn, MD  lisinopril (PRINIVIL,ZESTRIL) 10 MG tablet TAKE 1 TABLET BY MOUTH DAILY. **MUST HAVE OFFICE VISIT FOR REFILLS** 03/23/16  Yes Funches, Josalyn, MD  ranitidine (ZANTAC) 150 MG tablet Take 150 mg by mouth 2 (two) times daily.   Yes [provider]  SUBOXONE 8-2 MG FILM Inject 1 Film into the lesion 2 (two) times daily. 12/12/16  Yes [provider]  Blood Glucose Monitoring Suppl (TRUE METRIX METER) DEVI 1 kit by Does not apply route as directed. Use as directed 06/07/15   Boykin Nearing, MD  gabapentin (NEURONTIN) 100 MG capsule Take  1 capsule (100 mg total) by mouth 3 (three) times daily. Need office visit for additional refills Patient not taking: Reported on 12/16/2016 07/31/16   Boykin Nearing, MD  glucose blood test strip Use as instructed 08/20/16   Funches, Adriana Mccallum, MD  insulin NPH Human (HUMULIN N) 100 UNIT/ML injection Inject 50 units in the A.M and 40 units in the P.M. Patient not taking: Reported on 12/16/2016 04/19/16   Boykin Nearing, MD  Insulin Pen Needle (PEN NEEDLES 3/16") 31G X 5 MM MISC 1 each by Does not apply route 4 (four) times daily.  08/16/15   Funches, Adriana Mccallum, MD  Insulin Syringe-Needle U-100 (TRUEPLUS INSULIN SYRINGE) 30G X 5/16" 0.5 ML MISC 1 each by Other route 2 (two) times daily. 10/31/15   Funches, Adriana Mccallum, MD  Insulin Syringes, Disposable, U-100 0.5 ML MISC 1 each by Does not apply route 2 (two) times daily. 10/31/15   Boykin Nearing, MD  Lancet Devices [facility]) lancets 1 each by Other route 3 (three) times daily. 07/23/14   Boykin Nearing, MD  TRUEPLUS INSULIN SYRINGE 31G X 5/16" 0.5 ML MISC USE AS DIRECTED TWICE DAILY 08/20/16   Boykin Nearing, MD  TRUEPLUS LANCETS 28G MISC Use as directed 06/07/15   Boykin Nearing, MD    Inpatient Medications: Scheduled Meds: . atorvastatin  80 mg Oral q1800  . buprenorphine-naloxone  1 tablet Sublingual BID  . collagenase   Topical Daily  . enoxaparin (LOVENOX) injection  1 mg/kg Subcutaneous BID  . famotidine  10 mg Oral BID  . insulin aspart  0-9 Units Subcutaneous Q4H  . insulin glargine  30 Units Subcutaneous QHS  . lisinopril  10 mg Oral Daily   Continuous Infusions: . sodium chloride Stopped (12/17/16 0145)  . sodium chloride 125 mL/hr at 12/17/16 0924  . piperacillin-tazobactam (ZOSYN)  IV Stopped (12/17/16 0932)  . vancomycin Stopped (12/17/16 0105)   PRN Meds: acetaminophen **OR** acetaminophen, HYDROmorphone (DILAUDID) injection, zolpidem  Allergies:   No Known Allergies  Social History:   Social History   Social History  . Marital status: Married    Spouse name: N/A  . Number of children: 5  . Years of education: some colle   Occupational History  . Event set up [employer]   . [employer] for cars     Social History Main Topics  . Smoking status: Current Every Day Smoker    Packs/day: 1.00    Types: Cigarettes  . Smokeless tobacco: Never Used  . Alcohol use No     Comment: occasional  . Drug use: Yes    Types: Cocaine, IV, Heroin     Comment: last use Cocaine used 08/14/2015. IV last used Feb 2017  . Sexual  activity: Yes    Birth control/ protection: None   Other Topics Concern  . Not on file   Social History Narrative   Lives with wife and two children age 52 and 104.           Family History:    Family History  Problem Relation Age of Onset  . Diabetes Father   . Heart disease Father   . Mental illness Sister   . Cancer Neg Hx      ROS:  Please see the history of present illness.  ROS  All other ROS reviewed and negative.     Physical Exam/Data:   Vitals:   12/17/16 0600 12/17/16 0700 12/17/16 0748 12/17/16 0928  BP: (!) 150/79 (!) 168/90 Marland Kitchen)  170/90 135/78  Pulse: (!) 109  99   Resp: (!) 21 (!) 21 17   Temp:   98.6 F (37 C)   TempSrc:   Oral   SpO2: 97%  96%   Weight:      Height:        Intake/Output Summary (Last 24 hours) at 12/17/16 1044 Last data filed at 12/17/16 0433  Gross per 24 hour  Intake          1957.21 ml  Output             2200 ml  Net          -242.79 ml   Filed Weights   12/16/16 1549 12/17/16 0422 12/17/16 0426  Weight: 190 lb (86.2 kg) 176 lb 12.9 oz (80.2 kg) 177 lb 11.1 oz (80.6 kg)   Body mass index is 26.24 kg/m.  Affect appropriate Chronically ill black male  HEENT: normal Neck supple with no adenopathy JVP normal no bruits no thyromegaly Lungs clear with no wheezing and good diaphragmatic motion Heart:  S1/S2 no murmur, no rub, gallop or click PMI normal Abdomen: benighn, BS positve, no tenderness, no AAA no bruit.  No HSM or HJR Distal pulses intact with no bruits No edema Neuro non-focal Multiple full thickness wounds in both Upper Extremities  No muscular weakness   EKG:  The EKG was personally reviewed and demonstrates:  NSR LVH j point elevation no acute chagnes  Telemetry:  Telemetry was personally reviewed and demonstrates:  NSR   Relevant CV Studies: Echo Normal EF 60-65% no RWMA;s mild AR  Laboratory Data:  Chemistry Recent Labs Lab 12/16/16 1947 12/17/16 0248  NA 128* 131*  K 5.1 4.3  CL 95* 101    CO2 22 20*  GLUCOSE 534* 403*  BUN 16 15  CREATININE 1.51* 1.46*  CALCIUM 8.6* 8.5*  GFRNONAA 58* >60  GFRAA >60 >60  ANIONGAP 11 10     Recent Labs Lab 12/17/16 0248  PROT 5.8*  ALBUMIN 2.8*  AST 16  ALT 15*  ALKPHOS 130*  BILITOT 1.1   Hematology Recent Labs Lab 12/16/16 1947 12/17/16 0248  WBC 26.0* 22.4*  RBC 4.16* 3.81*  HGB 11.6* 10.2*  HCT 34.4* 31.3*  MCV 82.7 82.2  MCH 27.9 26.8  MCHC 33.7 32.6  RDW 13.4 13.4  PLT 372 380   Cardiac Enzymes Recent Labs Lab 12/17/16 0122 12/17/16 0723  TROPONINI <0.03 <0.03   No results for input(s): TROPIPOC in the last 168 hours.  BNPNo results for input(s): BNP, PROBNP in the last 168 hours.  DDimer  Recent Labs Lab 12/17/16 0248  DDIMER 0.57*    Radiology/Studies:  Ct Abdomen Pelvis W Contrast  Result Date: 12/16/2016 CLINICAL DATA:  Urinary retention and rectal pain with lower back pain for 1 week. Fever. Suspect abscess. History of gastroesophageal reflux disease, IV drug abuse, and diabetes. EXAM: CT ABDOMEN AND PELVIS WITH CONTRAST TECHNIQUE: Multidetector CT imaging of the abdomen and pelvis was performed using the standard protocol following bolus administration of intravenous contrast. CONTRAST:  172m ISOVUE-300 IOPAMIDOL (ISOVUE-300) INJECTION 61% COMPARISON:  04/11/2014 FINDINGS: Lower chest: Mild dependent changes in the lung bases. Hepatobiliary: No focal liver abnormality is seen. No gallstones, gallbladder wall thickening, or biliary dilatation. Pancreas: Unremarkable. No pancreatic ductal dilatation or surrounding inflammatory changes. Spleen: Normal in size without focal abnormality. Adrenals/Urinary Tract: No adrenal gland nodules. Punctate size calcifications in the right kidney. No hydronephrosis or hydroureter. No ureteral stones identified.  Bladder wall is not thickened and no bladder stones are identified. Stomach/Bowel: Stomach and small bowel are decompressed. Stool throughout the colon. No  colonic wall thickening or distention. The appendix is normal. There is a fluid collection demonstrated anterior and to the left of the anus, extending from about the 12 o'clock to the 2 o'clock position. The fluid collection measures about 2.2 cm diameter. This is consistent with an abscess. The abscess appears to be within the prostate gland but could be in the perianal tissues. Vascular/Lymphatic: No significant vascular findings are present. No enlarged abdominal or pelvic lymph nodes. Reproductive: Prostate gland is not enlarged. Possible fluid collection within the left prostate. See above. Other: No free air or free fluid in the abdomen. Abdominal wall musculature appears intact. Musculoskeletal: Normal alignment of the lumbar spine. Old endplate compression deformities of T10 and T11. IMPRESSION: 1. 2.2 cm circumscribed fluid collection consistent with an abscess. This may be prostatic or anterior perianal. 2. Nonobstructing intrarenal stones in the right kidney. Electronically Signed   By: Lucienne Capers M.D.   On: 12/16/2016 22:40    Assessment and Plan:   1. Chest Pain fleeting atypical normal echo and troponin negative ECG with chronic J point elevation no rub on exam. No need for further w/u 2. D dimer elevated V/Q ordered by primary service 3. Sepsis:  Being covered for prostatitis may need xray's of arms as they are edematous with multiple full thickness wounds from iv drug use  No need for further cardiac w/u will sign off    For questions or updates, please contact [place] Please consult [email or web address] for contact info under Cardiology/STEMI.   Signed, Jenkins Rouge, MD  12/17/2016 10:44 AM

## 2016-12-17 NOTE — Progress Notes (Signed)
  Echocardiogram 2D Echocardiogram has been performed.  Luke [name] 12/17/2016, 10:37 AM

## 2016-12-17 NOTE — Consult Note (Signed)
Urology Consult  Referring physician: Tyler Pita Reason for referral: Prostatitis  Chief Complaint: Prostatitis  History of Present Illness: young male with medical issues including insulin DM, iv drug use; retention symptoms; low grade fever; admitted with prostatitis and likely abscess for sepsis protocol; chronic reasonable flow and nocturia x 3; no GU surgery or UTI; no stones; nocturia recently worse and likely flow worse to go to ER  WBC today down to 22.4; Cr down to 1.46  CT scan: normal bladder and kidneys; 2.2 cm fluid collection anterior and to the left of anus likely in prostate or perianal;   Modifying factors: There are no other modifying factors  Associated signs and symptoms: There are no other associated signs and symptoms Aggravating and relieving factors: There are no other aggravating or relieving factors Severity: Moderate Duration: Persistent   Past Medical History:  Diagnosis Date  . Anemia   . Anxiety  Dx 2008  . Diabetes mellitus without complication ([place]) Dx 6270  . GERD (gastroesophageal reflux disease) Dx 2008  . Headache(784.0)   . Pneumonia   . Seizures ([place]) 2011    x 2 in lifetime. on Dilantin for a while.   . 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 I&D EXTREMITY Left 10/11/2012   Procedure: IRRIGATION AND DEBRIDEMENT ABSCESS FOREARM;  Surgeon: Linna Hoff, MD;  Location: [place];  Service: Orthopedics;  Laterality: Left;  . I&D EXTREMITY Left 10/12/2012   Procedure: IRRIGATION AND DEBRIDEMENT FOREARM;  Surgeon: Linna Hoff, MD;  Location: [place];  Service: Orthopedics;  Laterality: Left;  . I&D EXTREMITY Left 10/14/2012   Procedure: incision and drainage left forearm;  Surgeon: Linna Hoff, MD;  Location: [place];  Service: Orthopedics;  Laterality: Left;  . I&D EXTREMITY Left 10/16/2012   Procedure: IRRIGATION AND DEBRIDEMENT LEFT FOREARM;  Surgeon: Linna Hoff, MD;  Location:  [place];  Service: Orthopedics;  Laterality: Left;  . I&D EXTREMITY Left 10/20/2012   Procedure: INCISION AND DRAINAGE AND DEBRIDEMENT LEFT  FOREARM;  Surgeon: Linna Hoff, MD;  Location: [place];  Service: Orthopedics;  Laterality: Left;  . IRRIGATION AND DEBRIDEMENT ABSCESS     Hx: of left arm abscess related to drug use     Medications: I have reviewed the patient's current medications. Allergies: No Known Allergies  Family History  Problem Relation Age of Onset  . Diabetes Father   . Heart disease Father   . Mental illness Sister   . Cancer Neg Hx    Social History:  reports that he has been smoking Cigarettes.  He has been smoking about 1.00 pack per day. He has never used smokeless tobacco. He reports that he uses drugs, including Cocaine, IV, and Heroin. He reports that he does not drink alcohol.  ROS: All systems are reviewed and negative except as noted. Rest negative  Physical Exam:  Vital signs in last 24 hours: Temp:  [98.7 F (37.1 C)-100.3 F (37.9 C)] 98.7 F (37.1 C) (10/01 0422) Pulse Rate:  [93-133] 109 (10/01 0600) Resp:  [12-24] 21 (10/01 0700) BP: (124-183)/(56-102) 168/90 (10/01 0700) SpO2:  [94 %-100 %] 97 % (10/01 0600) Weight:  [80.2 kg (176 lb 12.9 oz)-86.2 kg (190 lb)] 80.6 kg (177 lb 11.1 oz) (10/01 0426)  Cardiovascular: Skin warm; not flushed Respiratory: Breaths quiet; no shortness of breath Abdomen: No masses Neurological: Normal sensation to touch Musculoskeletal: Normal motor function  arms and legs Lymphatics: No inguinal adenopathy Skin: No rashes Genitourinary:normal genitalia and non-toxic  Laboratory Data:  Results for orders placed or performed during the hospital encounter of 12/16/16 (from the past 72 hour(s))  Urinalysis, Routine w reflex microscopic- may I&O cath if menses     Status: Abnormal   Collection Time: 12/16/16  3:56 PM  Result Value Ref Range   Color, Urine AMBER (A) YELLOW    Comment: BIOCHEMICALS MAY BE AFFECTED BY  COLOR   APPearance CLOUDY (A) CLEAR   Specific Gravity, Urine 1.025 1.005 - 1.030   pH 5.0 5.0 - 8.0   Glucose, UA NEGATIVE NEGATIVE mg/dL   Hgb urine dipstick SMALL (A) NEGATIVE   Bilirubin Urine NEGATIVE NEGATIVE   Ketones, ur 5 (A) NEGATIVE mg/dL   Protein, ur 100 (A) NEGATIVE mg/dL   Nitrite NEGATIVE NEGATIVE   Leukocytes, UA LARGE (A) NEGATIVE   RBC / HPF 6-30 0 - 5 RBC/hpf   WBC, UA TOO NUMEROUS TO COUNT 0 - 5 WBC/hpf   Bacteria, UA RARE (A) NONE SEEN   Squamous Epithelial / LPF NONE SEEN NONE SEEN   Mucus PRESENT    Hyaline Casts, UA PRESENT    Granular Casts, UA PRESENT   CBG monitoring, ED     Status: Abnormal   Collection Time: 12/16/16  3:59 PM  Result Value Ref Range   Glucose-Capillary 221 (H) 65 - 99 mg/dL  CBC with Differential     Status: Abnormal   Collection Time: 12/16/16  7:47 PM  Result Value Ref Range   WBC 26.0 (H) 4.0 - 10.5 K/uL   RBC 4.16 (L) 4.22 - 5.81 MIL/uL   Hemoglobin 11.6 (L) 13.0 - 17.0 g/dL   HCT 34.4 (L) 39.0 - 52.0 %   MCV 82.7 78.0 - 100.0 fL   MCH 27.9 26.0 - 34.0 pg   MCHC 33.7 30.0 - 36.0 g/dL   RDW 13.4 11.5 - 15.5 %   Platelets 372 150 - 400 K/uL   Neutrophils Relative % 75 %   Lymphocytes Relative 15 %   Monocytes Relative 9 %   Eosinophils Relative 1 %   Basophils Relative 0 %   Neutro Abs 19.5 (H) 1.7 - 7.7 K/uL   Lymphs Abs 3.9 0.7 - 4.0 K/uL   Monocytes Absolute 2.3 (H) 0.1 - 1.0 K/uL   Eosinophils Absolute 0.3 0.0 - 0.7 K/uL   Basophils Absolute 0.0 0.0 - 0.1 K/uL   Smear Review PLATELETS APPEAR ADEQUATE   Basic metabolic panel     Status: Abnormal   Collection Time: 12/16/16  7:47 PM  Result Value Ref Range   Sodium 128 (L) 135 - 145 mmol/L   Potassium 5.1 3.5 - 5.1 mmol/L   Chloride 95 (L) 101 - 111 mmol/L   CO2 22 22 - 32 mmol/L   Glucose, Bld 534 (HH) 65 - 99 mg/dL    Comment: CRITICAL RESULT CALLED TO, READ BACK BY AND VERIFIED WITH: CARLA PRICE RN AT 2031 12/16/16 BY WOOLLENK    BUN 16 6 - 20 mg/dL    Creatinine, Ser 1.51 (H) 0.61 - 1.24 mg/dL   Calcium 8.6 (L) 8.9 - 10.3 mg/dL   GFR calc non Af Amer 58 (L) >60 mL/min   GFR calc Af Amer >60 >60 mL/min    Comment: (NOTE) The eGFR has been calculated using the CKD EPI equation. This calculation has not been validated in all clinical situations. eGFR's persistently <60 mL/min signify possible Chronic  Kidney Disease.    Anion gap 11 5 - 15  I-Stat venous blood gas, ED     Status: Abnormal   Collection Time: 12/16/16  8:08 PM  Result Value Ref Range   pH, Ven 7.443 (H) 7.250 - 7.430   pCO2, Ven 40.0 (L) 44.0 - 60.0 mmHg   pO2, Ven 46.0 (H) 32.0 - 45.0 mmHg   Bicarbonate 27.3 20.0 - 28.0 mmol/L   TCO2 29 22 - 32 mmol/L   O2 Saturation 83.0 %   Acid-Base Excess 3.0 (H) 0.0 - 2.0 mmol/L   Patient temperature HIDE    Collection site BRACHIAL ARTERY    Sample type VENOUS   I-Stat CG4 Lactic Acid, ED     Status: None   Collection Time: 12/16/16  8:09 PM  Result Value Ref Range   Lactic Acid, Venous 1.71 0.5 - 1.9 mmol/L  CBG monitoring, ED     Status: Abnormal   Collection Time: 12/16/16 10:45 PM  Result Value Ref Range   Glucose-Capillary >600 (HH) 65 - 99 mg/dL  POC CBG, ED     Status: Abnormal   Collection Time: 12/16/16 11:51 PM  Result Value Ref Range   Glucose-Capillary 554 (HH) 65 - 99 mg/dL  Hemoglobin A1c     Status: Abnormal   Collection Time: 12/17/16  1:22 AM  Result Value Ref Range   Hgb A1c MFr Bld 10.6 (H) 4.8 - 5.6 %    Comment: (NOTE) Pre diabetes:          5.7%-6.4% Diabetes:              >6.4% Glycemic control for   <7.0% adults with diabetes    Mean Plasma Glucose 257.52 mg/dL  Troponin I     Status: Abnormal   Collection Time: 12/17/16  1:22 AM  Result Value Ref Range   Troponin I 0.26 (HH) <0.03 ng/mL    Comment: CRITICAL RESULT CALLED TO, READ BACK BY AND VERIFIED WITH: B.OLDLAND,RN 4742 12/17/16 [id number]   Comprehensive metabolic panel     Status: Abnormal   Collection Time: 12/17/16  2:48 AM   Result Value Ref Range   Sodium 131 (L) 135 - 145 mmol/L   Potassium 4.3 3.5 - 5.1 mmol/L   Chloride 101 101 - 111 mmol/L   CO2 20 (L) 22 - 32 mmol/L   Glucose, Bld 403 (H) 65 - 99 mg/dL   BUN 15 6 - 20 mg/dL   Creatinine, Ser 1.46 (H) 0.61 - 1.24 mg/dL   Calcium 8.5 (L) 8.9 - 10.3 mg/dL   Total Protein 5.8 (L) 6.5 - 8.1 g/dL   Albumin 2.8 (L) 3.5 - 5.0 g/dL   AST 16 15 - 41 U/L   ALT 15 (L) 17 - 63 U/L   Alkaline Phosphatase 130 (H) 38 - 126 U/L   Total Bilirubin 1.1 0.3 - 1.2 mg/dL   GFR calc non Af Amer >60 >60 mL/min   GFR calc Af Amer >60 >60 mL/min    Comment: (NOTE) The eGFR has been calculated using the CKD EPI equation. This calculation has not been validated in all clinical situations. eGFR's persistently <60 mL/min signify possible Chronic Kidney Disease.    Anion gap 10 5 - 15  CBC     Status: Abnormal   Collection Time: 12/17/16  2:48 AM  Result Value Ref Range   WBC 22.4 (H) 4.0 - 10.5 K/uL   RBC 3.81 (L) 4.22 - 5.81 MIL/uL   Hemoglobin 10.2 (  L) 13.0 - 17.0 g/dL   HCT 31.3 (L) 39.0 - 52.0 %   MCV 82.2 78.0 - 100.0 fL   MCH 26.8 26.0 - 34.0 pg   MCHC 32.6 30.0 - 36.0 g/dL   RDW 13.4 11.5 - 15.5 %   Platelets 380 150 - 400 K/uL  D-dimer, quantitative (not at [facility])     Status: Abnormal   Collection Time: 12/17/16  2:48 AM  Result Value Ref Range   D-Dimer, Quant 0.57 (H) 0.00 - 0.50 ug/mL-FEU    Comment: (NOTE) At the manufacturer cut-off of 0.50 ug/mL FEU, this assay has been documented to exclude PE with a sensitivity and negative predictive value of 97 to 99%.  At this time, this assay has not been approved by the FDA to exclude DVT/VTE. Results should be correlated with clinical presentation.   CK total and CKMB (cardiac)not at [facility]     Status: None   Collection Time: 12/17/16  2:48 AM  Result Value Ref Range   Total CK 202 49 - 397 U/L   CK, MB 3.6 0.5 - 5.0 ng/mL   Relative Index 1.8 0.0 - 2.5  CBG monitoring, ED     Status: Abnormal    Collection Time: 12/17/16  3:28 AM  Result Value Ref Range   Glucose-Capillary 423 (H) 65 - 99 mg/dL  Glucose, capillary     Status: Abnormal   Collection Time: 12/17/16  4:31 AM  Result Value Ref Range   Glucose-Capillary 428 (H) 65 - 99 mg/dL   No results found for this or any previous visit (from the past 240 hour(s)). Creatinine:  Recent Labs  12/16/16 1947 12/17/16 0248  CREATININE 1.51* 1.46*    Xrays: See report/chart As above  Impression/Assessment:  Prostatitis and abscess; continue sepsis protocol; CONSIDER REFERRAL TO ID FOR LENGTH/TYPE OF ANTIBIOTICS AS AN OUTPATIENT  Plan:  These usually settle with antibiotcs; will need outpt scan to make sure abscess normalized; repeat CT scan if clinical course worsens and await cultures; will follow  Micheil Klaus A 12/17/2016, 7:38 AM

## 2016-12-17 NOTE — Progress Notes (Addendum)
Inpatient Diabetes Program Recommendations  AACE/ADA: New Consensus Statement on Inpatient Glycemic Control (2015)  Target Ranges:  Prepandial:   less than 140 mg/dL      Peak postprandial:   less than 180 mg/dL (1-2 hours)      Critically ill patients:  140 - 180 mg/dL   Results for Luke, [name] (MRN [id number]) as of 12/17/2016 08:28  Ref. Range 12/16/2016 15:59 12/16/2016 22:45 12/16/2016 23:51 12/17/2016 03:28 12/17/2016 04:31  Glucose-Capillary Latest Ref Range: 65 - 99 mg/dL 221 (H) >600 (HH) 554 (HH) 423 (H) 428 (H)  Results for Luke, [name] (MRN [id number]) as of 12/17/2016 08:28  Ref. Range 12/17/2016 01:22  Hemoglobin A1C Latest Ref Range: 4.8 - 5.6 % 10.6 (H)   Review of Glycemic Control  Diabetes history: DM1 Outpatient Diabetes medications: 70/30 50 units QAM, 70/30 40 units QPM, Novolog 5 units TID with meals Current orders for Inpatient glycemic control: 70/30 25 units BID, Novolog 0-9 units Q4H  Inpatient Diabetes Program Recommendations: Insulin - Basal: If patient will remain NPO, please consider discontinuing 70/30 insulin and ordering Lantus 32 units Q24H (based on 80 kg x 0.4 units).  Addendum 12/17/16@12 :15-Spoke with patient and his wife about diabetes and home regimen for diabetes control. Patient reports that his foley catheter is causing pain and is grimacing and guarding peri area. Patient called desk to ask about taking catheter out. When questions asked regarding DM control and regimen, patient stated his wife could answer questions because he was in too much pain to talk right now.  Patient reports that he has Type 1 diabetes and that he is followed by [facility]) for diabetes management and currently he takes 70/30 50 units QAM, 70/30 40 units QPM, Novolog sliding scale when needed as an outpatient for diabetes control. Patient's wife reports that patient is taking morning 70/30 with breakfast and evening 70/30 at bedtime. Patient's wife  reports that his glucose fluctuates and commonly drops during the night.  Discussed 70/30 insulin and explained that it needs to be given with food (breakfast and supper).  Inquired about prior A1C and patient reports that he does not recall his last A1C value. Discussed A1C results (10.6% on 12/17/16) and explained that his current A1C indicates an average glucose of 258 mg/dl over the past 2-3 months. Discussed glucose and A1C goals. Discussed importance of checking CBGs and maintaining good CBG control to prevent long-term and short-term complications. Explained how hyperglycemia leads to damage within blood vessels which lead to the common complications seen with uncontrolled diabetes. Stressed to the patient the importance of improving glycemic control to prevent further complications from uncontrolled diabetes especially to promote wound healing. Encouraged patient to take insulin as prescribed and to keep a log of glucose readings so doctor at the clinic can help to improve DM control. Patient has not been to the [facility] in "quite a long time". Encouraged patient to make follow up appointment at the [facility].  Patient verbalized understanding of information discussed and he states that he has no further questions at this time related to diabetes.  Thanks, Barnie Alderman, RN, MSN, CDE Diabetes Coordinator Inpatient Diabetes Program [phone] (Team Pager from 8am to 5pm)

## 2016-12-17 NOTE — Progress Notes (Signed)
[facility]                                                                              Patient Demographics  Luke [name], is a 36 y.o. male, DOB - 18-Dec-1980, JJO:[id number]  Admit date - 12/16/2016   Admitting Physician Jani Gravel, MD  Outpatient Primary MD for the patient is Boykin Nearing, MD  Outpatient specialists:   LOS - 1  days   Medical records reviewed and are as summarized below:    Chief Complaint  Patient presents with  . Urinary Retention       Brief summary   Patient is a 36 year old male with IV heroin abuse, seizures, diabetes type 2, presented with urinary retention that shot on the morning of admission, denied any dysuria or hematuria. In the ED febrile with temperature of 100.3, tachycardiac 133, WBC is 26.0, creatinine 1.5, glucose 534. CT abdomen showed 2.2 cm abscess prostatic or anterior perianal, nonobstructing intrarenal stones in the right kidney   Assessment & Plan    Principal Problem:   Sepsis ([place]) secondary to abscess, prostatic presented with acute urinary retention, fevers - patient met sepsis criteria at the time of admission with fever, tachycardia, leukocytosis, CT abdomen with prostatic abscess - Placed on IV fluid hydration, IV vancomycin and Zosyn -  urology consulted, awaiting further recommendations if patient needs surgery for abscess drainage - Follow blood cultureser urine culture  Active Problems:   Drug abuse, IV ([name])polysubstance abuse, cocaine use - patient reports heroin use every day 1 g, last use on 9/29 - Currently comfortable, no withdrawals. If patient goes into acute withdrawals, will need to place him on scheduled morphine and hold Suboxone - obtain UDS   Tachycardia, mild chest pain  - likely due to sepsis, elevated d-dimer, VQ scan pending - Follow 2-D echo, on therapeutic Lovenox - UDS - troponins 2 negative, cardiology was consulted     DM type 2, uncontrolled, with renal  complications ([place]) - uncontrolled, hemoglobin A1c 10.6, presented with CBG of 534 - currently NPO awaiting urology evaluation, per diabetic coordinator recommendations, placed on Lantus 30 units at bedtime    AKI (acute kidney injury) ([place]) - Likely due to sepsis, prostatic abscess, lisinopril - continue IV fluid hydration, hold lisinopril    Anemia  - likely hemodilution, patient on IV fluid hydration, monitor closely - obtain anemia panel   Code Status: full  DVT Prophylaxis:  Lovenox  Family Communication: Discussed in detail with the patient, all imaging results, lab results explained to the patient   Disposition Plan:   Time Spent in minutes  25 minutes  Procedures:    Consultants:   Urology Cardiology  Antimicrobials:   iV vancomycin 9/30  IV Zosyn 9/30   Medications  Scheduled Meds: . atorvastatin  80 mg Oral q1800  . buprenorphine-naloxone  1 tablet Sublingual BID  . collagenase   Topical Daily  . enoxaparin (LOVENOX) injection  1 mg/kg Subcutaneous BID  . famotidine  10 mg Oral BID  . insulin aspart  0-9 Units Subcutaneous Q4H  . insulin aspart protamine- aspart  25 Units Subcutaneous BID WC  .  lisinopril  10 mg Oral Daily   Continuous Infusions: . sodium chloride Stopped (12/17/16 0145)  . sodium chloride 125 mL/hr at 12/17/16 0924  . piperacillin-tazobactam (ZOSYN)  IV Stopped (12/17/16 0932)  . vancomycin Stopped (12/17/16 0105)   PRN Meds:.acetaminophen **OR** acetaminophen, HYDROmorphone (DILAUDID) injection, zolpidem   Antibiotics   Anti-infectives    Start     Dose/Rate Route Frequency Ordered Stop   12/17/16 0600  piperacillin-tazobactam (ZOSYN) IVPB 3.375 g     3.375 g 12.5 mL/hr over 240 Minutes Intravenous Every 8 hours 12/16/16 2345     12/17/16 0000  vancomycin (VANCOCIN) 1,250 mg in sodium chloride 0.9 % 250 mL IVPB  Status:  Discontinued     1,250 mg 166.7 mL/hr over 90 Minutes Intravenous Every 12 hours 12/16/16 2345 12/16/16  2348   12/17/16 0000  vancomycin (VANCOCIN) IVPB 1000 mg/200 mL premix     1,000 mg 200 mL/hr over 60 Minutes Intravenous Every 12 hours 12/16/16 2348     12/16/16 2315  piperacillin-tazobactam (ZOSYN) IVPB 3.375 g     3.375 g 100 mL/hr over 30 Minutes Intravenous  Once 12/16/16 2310 12/17/16 0035   12/16/16 1900  cefTRIAXone (ROCEPHIN) 1 g in dextrose 5 % 50 mL IVPB  Status:  Discontinued     1 g 100 mL/hr over 30 Minutes Intravenous  Once 12/16/16 1854 12/16/16 1855   12/16/16 1900  ciprofloxacin (CIPRO) tablet 500 mg     500 mg Oral  Once 12/16/16 1856 12/16/16 2005        Subjective:   Luke [name] was seen and examined today. Patient denies dizziness, shortness of breath, abdominal pain, N/V/D/C, new weakness, numbess, tingling. No acute events overnight.  Currently no chest pain or fevers  Objective:   Vitals:   12/17/16 0600 12/17/16 0700 12/17/16 0748 12/17/16 0928  BP: (!) 150/79 (!) 168/90 (!) 170/90 135/78  Pulse: (!) 109  99   Resp: (!) 21 (!) 21 17   Temp:   98.6 F (37 C)   TempSrc:   Oral   SpO2: 97%  96%   Weight:      Height:        Intake/Output Summary (Last 24 hours) at 12/17/16 1011 Last data filed at 12/17/16 0433  Gross per 24 hour  Intake          1957.21 ml  Output             2200 ml  Net          -242.79 ml     Wt Readings from Last 3 Encounters:  12/17/16 80.6 kg (177 lb 11.1 oz)  10/31/15 82.5 kg (181 lb 12.8 oz)  08/16/15 85.3 kg (188 lb)     Exam  General: Alert and oriented x 3, NAD  Eyes:   HEENT:    Cardiovascular: S1 S2 auscultated, no rubs, murmurs or gallops. Regular rate and rhythm.  Respiratory: Clear to auscultation bilaterally, no wheezing, rales or rhonchi  Gastrointestinal: Soft, nontender, nondistended, + bowel sounds  Ext: no pedal edema bilaterally  Neuro: AAOx3, Cr N's II- XII. Strength 5/5 upper and lower extremities bilaterally, speech clear  Musculoskeletal: No digital cyanosis, clubbing  Skin: No  rashes  Psych: Normal affect and demeanor, alert and oriented x3    Data Reviewed:  I have personally reviewed following labs and imaging studies  Micro Results Recent Results (from the past 240 hour(s))  MRSA PCR Screening     Status: Abnormal  Collection Time: 12/17/16  4:16 AM  Result Value Ref Range Status   MRSA by PCR POSITIVE (A) NEGATIVE Final    Comment:        The GeneXpert MRSA Assay (FDA approved for NASAL specimens only), is one component of a comprehensive MRSA colonization surveillance program. It is not intended to diagnose MRSA infection nor to guide or monitor treatment for MRSA infections. RESULT CALLED TO, READ BACK BY AND VERIFIED WITH: K PACE,RN AT 7416 12/17/16 BY L BENFIELD     Radiology Reports Ct Abdomen Pelvis W Contrast  Result Date: 12/16/2016 CLINICAL DATA:  Urinary retention and rectal pain with lower back pain for 1 week. Fever. Suspect abscess. History of gastroesophageal reflux disease, IV drug abuse, and diabetes. EXAM: CT ABDOMEN AND PELVIS WITH CONTRAST TECHNIQUE: Multidetector CT imaging of the abdomen and pelvis was performed using the standard protocol following bolus administration of intravenous contrast. CONTRAST:  178m ISOVUE-300 IOPAMIDOL (ISOVUE-300) INJECTION 61% COMPARISON:  04/11/2014 FINDINGS: Lower chest: Mild dependent changes in the lung bases. Hepatobiliary: No focal liver abnormality is seen. No gallstones, gallbladder wall thickening, or biliary dilatation. Pancreas: Unremarkable. No pancreatic ductal dilatation or surrounding inflammatory changes. Spleen: Normal in size without focal abnormality. Adrenals/Urinary Tract: No adrenal gland nodules. Punctate size calcifications in the right kidney. No hydronephrosis or hydroureter. No ureteral stones identified. Bladder wall is not thickened and no bladder stones are identified. Stomach/Bowel: Stomach and small bowel are decompressed. Stool throughout the colon. No colonic wall  thickening or distention. The appendix is normal. There is a fluid collection demonstrated anterior and to the left of the anus, extending from about the 12 o'clock to the 2 o'clock position. The fluid collection measures about 2.2 cm diameter. This is consistent with an abscess. The abscess appears to be within the prostate gland but could be in the perianal tissues. Vascular/Lymphatic: No significant vascular findings are present. No enlarged abdominal or pelvic lymph nodes. Reproductive: Prostate gland is not enlarged. Possible fluid collection within the left prostate. See above. Other: No free air or free fluid in the abdomen. Abdominal wall musculature appears intact. Musculoskeletal: Normal alignment of the lumbar spine. Old endplate compression deformities of T10 and T11. IMPRESSION: 1. 2.2 cm circumscribed fluid collection consistent with an abscess. This may be prostatic or anterior perianal. 2. Nonobstructing intrarenal stones in the right kidney. Electronically Signed   By: WLucienne CapersM.D.   On: 12/16/2016 22:40    Lab Data:  CBC:  Recent Labs Lab 12/16/16 1947 12/17/16 0248  WBC 26.0* 22.4*  NEUTROABS 19.5*  --   HGB 11.6* 10.2*  HCT 34.4* 31.3*  MCV 82.7 82.2  PLT 372 3384  Basic Metabolic Panel:  Recent Labs Lab 12/16/16 1947 12/17/16 0248  NA 128* 131*  K 5.1 4.3  CL 95* 101  CO2 22 20*  GLUCOSE 534* 403*  BUN 16 15  CREATININE 1.51* 1.46*  CALCIUM 8.6* 8.5*   GFR: Estimated Creatinine Clearance: 69.9 mL/min (A) (by C-G formula based on SCr of 1.46 mg/dL (H)). Liver Function Tests:  Recent Labs Lab 12/17/16 0248  AST 16  ALT 15*  ALKPHOS 130*  BILITOT 1.1  PROT 5.8*  ALBUMIN 2.8*   No results for input(s): LIPASE, AMYLASE in the last 168 hours. No results for input(s): AMMONIA in the last 168 hours. Coagulation Profile: No results for input(s): INR, PROTIME in the last 168 hours. Cardiac Enzymes:  Recent Labs Lab 12/17/16 0122  12/17/16 0248 12/17/16 05364  CKTOTAL  --  202  --   CKMB  --  3.6  --   TROPONINI <0.03  --  <0.03   BNP (last 3 results) No results for input(s): PROBNP in the last 8760 hours. HbA1C:  Recent Labs  12/17/16 0122  HGBA1C 10.6*   CBG:  Recent Labs Lab 12/16/16 2245 12/16/16 2351 12/17/16 0328 12/17/16 0431 12/17/16 0745  GLUCAP >600* 554* 423* 428* 405*   Lipid Profile: No results for input(s): CHOL, HDL, LDLCALC, TRIG, CHOLHDL, LDLDIRECT in the last 72 hours. Thyroid Function Tests: No results for input(s): TSH, T4TOTAL, FREET4, T3FREE, THYROIDAB in the last 72 hours. Anemia Panel: No results for input(s): VITAMINB12, FOLATE, FERRITIN, TIBC, IRON, RETICCTPCT in the last 72 hours. Urine analysis:    Component Value Date/Time   COLORURINE AMBER (A) 12/16/2016 1556   APPEARANCEUR CLOUDY (A) 12/16/2016 1556   APPEARANCEUR Clear 04/11/2014 0138   LABSPEC 1.025 12/16/2016 1556   LABSPEC 1.026 04/11/2014 0138   PHURINE 5.0 12/16/2016 1556   GLUCOSEU NEGATIVE 12/16/2016 1556   GLUCOSEU >=500 04/11/2014 0138   HGBUR SMALL (A) 12/16/2016 1556   BILIRUBINUR NEGATIVE 12/16/2016 1556   BILIRUBINUR negative 10/31/2015 1236   BILIRUBINUR Negative 04/11/2014 0138   KETONESUR 5 (A) 12/16/2016 1556   PROTEINUR 100 (A) 12/16/2016 1556   UROBILINOGEN 0.2 10/31/2015 1236   UROBILINOGEN 0.2 12/10/2013 1221   NITRITE NEGATIVE 12/16/2016 1556   LEUKOCYTESUR LARGE (A) 12/16/2016 1556   LEUKOCYTESUR Negative 04/11/2014 0138     Rori Goar M.D. [facility] 12/17/2016, 10:11 AM  Pager: [phone] Between 7am to 7pm - call Pager - [phone]  After 7pm go to [email or web address] - password [email or web address]  Call night coverage person covering after 7pm

## 2016-12-17 NOTE — Plan of Care (Signed)
Problem: Education: Goal: Knowledge of [facility] Education information/materials will improve Outcome: Progressing Discussed plan of care for the rest of shift with patient, pain management and his high glucose levels with some teach back displayed.  Patient not understanding why he couldn't have water and procedures of the department will have to be reenforced.

## 2016-12-18 ENCOUNTER — Inpatient Hospital Stay (HOSPITAL_COMMUNITY): Payer: Self-pay

## 2016-12-18 LAB — C DIFFICILE QUICK SCREEN W PCR REFLEX
C DIFFICILE (CDIFF) INTERP: NOT DETECTED
C DIFFICILE (CDIFF) TOXIN: NEGATIVE
C Diff antigen: NEGATIVE

## 2016-12-18 LAB — GLUCOSE, CAPILLARY
GLUCOSE-CAPILLARY: 180 mg/dL — AB (ref 65–99)
GLUCOSE-CAPILLARY: 350 mg/dL — AB (ref 65–99)
GLUCOSE-CAPILLARY: 304 mg/dL — AB (ref 65–99)
Glucose-Capillary: 192 mg/dL — ABNORMAL HIGH (ref 65–99)

## 2016-12-18 LAB — RAPID URINE DRUG SCREEN, HOSP PERFORMED
Amphetamines: NOT DETECTED
BARBITURATES: NOT DETECTED
Benzodiazepines: NOT DETECTED
Cocaine: POSITIVE — AB
Opiates: POSITIVE — AB
Tetrahydrocannabinol: NOT DETECTED

## 2016-12-18 MED ORDER — HEPARIN SODIUM (PORCINE) 5000 UNIT/ML IJ SOLN
5000.0000 [IU] | Freq: Three times a day (TID) | INTRAMUSCULAR | Status: DC
  Filled 2016-12-18: qty 1

## 2016-12-18 MED ORDER — DIPHENOXYLATE-ATROPINE 2.5-0.025 MG PO TABS
2.0000 | ORAL_TABLET | Freq: Once | ORAL | Status: AC
  Administered 2016-12-18: 2 via ORAL
  Filled 2016-12-18: qty 2

## 2016-12-18 MED ORDER — MUPIROCIN 2 % EX OINT
1.0000 "application " | TOPICAL_OINTMENT | Freq: Two times a day (BID) | CUTANEOUS | Status: DC
  Administered 2016-12-18: 1 via NASAL
  Filled 2016-12-18: qty 22

## 2016-12-18 MED ORDER — NICOTINE 21 MG/24HR TD PT24
21.0000 mg | MEDICATED_PATCH | Freq: Every day | TRANSDERMAL | Status: DC
  Administered 2016-12-18 – 2016-12-19 (×2): 21 mg via TRANSDERMAL
  Filled 2016-12-18 (×2): qty 1

## 2016-12-18 MED ORDER — INSULIN ASPART 100 UNIT/ML ~~LOC~~ SOLN: 3.0000 [IU] | Freq: Three times a day (TID) | SUBCUTANEOUS | Status: DC

## 2016-12-18 MED ORDER — INSULIN ASPART 100 UNIT/ML ~~LOC~~ SOLN
0.0000 [IU] | Freq: Three times a day (TID) | SUBCUTANEOUS | Status: DC
  Administered 2016-12-18: 7 [IU] via SUBCUTANEOUS
  Administered 2016-12-18: 2 [IU] via SUBCUTANEOUS
  Administered 2016-12-19: 3 [IU] via SUBCUTANEOUS

## 2016-12-18 MED ORDER — INSULIN ASPART 100 UNIT/ML ~~LOC~~ SOLN
0.0000 [IU] | Freq: Every day | SUBCUTANEOUS | Status: DC
  Administered 2016-12-18: 4 [IU] via SUBCUTANEOUS

## 2016-12-18 MED ORDER — CHLORHEXIDINE GLUCONATE CLOTH 2 % EX PADS
6.0000 | MEDICATED_PAD | Freq: Every day | CUTANEOUS | Status: DC
  Administered 2016-12-19: 6 via TOPICAL

## 2016-12-18 MED ORDER — INSULIN GLARGINE 100 UNIT/ML ~~LOC~~ SOLN
33.0000 [IU] | Freq: Every day | SUBCUTANEOUS | Status: DC
  Administered 2016-12-18: 33 [IU] via SUBCUTANEOUS
  Filled 2016-12-18: qty 0.33

## 2016-12-18 MED ORDER — INSULIN ASPART 100 UNIT/ML ~~LOC~~ SOLN
5.0000 [IU] | Freq: Three times a day (TID) | SUBCUTANEOUS | Status: DC
  Administered 2016-12-18 – 2016-12-19 (×2): 5 [IU] via SUBCUTANEOUS

## 2016-12-18 NOTE — Progress Notes (Signed)
Venous Duplex Upper extremities not completed. Patient refused test. Oda Cogan, BS, RDMS, RVT

## 2016-12-18 NOTE — Progress Notes (Signed)
Stool sample collected and sent to lab.

## 2016-12-18 NOTE — Progress Notes (Signed)
CDIF negative. Pt requests anti-diarrheal. On call notified.

## 2016-12-18 NOTE — Progress Notes (Signed)
Patient transferring to 2W. Report called to receiving nurse Christian. All questions answered. Patient notified family of transfer. Personal belongings transferred with patient.

## 2016-12-18 NOTE — Progress Notes (Signed)
[facility]                                                                              Patient Demographics  Luke [name], is a 36 y.o. male, DOB - [birth year], YWV:[id number]  Admit date - 12/16/2016   Admitting Physician Jani Gravel, MD  Outpatient Primary MD for the patient is Boykin Nearing, MD  Outpatient specialists:   LOS - 2  days   Medical records reviewed and are as summarized below:    Chief Complaint  Patient presents with  . Urinary Retention       Brief summary   Patient is a 36 year old male with IV heroin abuse, seizures, diabetes type 2, presented with urinary retention that shot on the morning of admission, denied any dysuria or hematuria. In the ED febrile with temperature of 100.3, tachycardiac 133, WBC is 26.0, creatinine 1.5, glucose 534. CT abdomen showed 2.2 cm abscess prostatic or anterior perianal, nonobstructing intrarenal stones in the right kidney   Assessment & Plan    Principal Problem:   Sepsis ([place]) secondary to abscess, prostatic presented with acute urinary retention, fevers - patient met sepsis criteria at the time of admission with fever, tachycardia, leukocytosis, CT abdomen with prostatic abscess - Improving, continue IV vancomycin and Zosyn empirically  -  Urine culture showing more than 100,000 colonies of staph aureus, sensitivities pending, blood cultures negative so far   Active Problems:   Drug abuse, IV ([name])polysubstance abuse, cocaine use - patient reports heroin use every day 1 g, last use on 9/29 - Currently comfortable, no withdrawals. If patient goes into acute withdrawals, will need to place him on scheduled morphine and hold Suboxone - UDS pending   Nicotine abuse - Placed on nicotine patch   Tachycardia, mild chest pain  - likely due to sepsis, elevated d-dimer, VQ scan normal -2-D echo showed EF of 60-65%, no regional wall motion abnormalities -UDS pending. Troponins negative, cardiology was  consulted, recommended no further cardiac workup.     DM type 2, uncontrolled, with renal complications ([name]) - uncontrolled, hemoglobin A1c 10.6, presented with CBG of 534 - Started on card modified diet, increase Lantus to 33 units at bedtime, increase meal coverage NovoLog to 5 units TID AC, sliding scale insulin    AKI (acute kidney injury) ([place]) - Likely due to sepsis, prostatic abscess, lisinopril - Creatinine improving, 1.4 today, continue to hold lisinopril - Continue gentle hydration    Anemia  - likely hemodilution, patient on IV fluid hydration, monitor closely - Anemia panel, H&H pending   Code Status: full  DVT Prophylaxis:  DC therapeutic Lovenox, placed on heparin subcutaneous Family Communication: Discussed in detail with the patient, all imaging results, lab results explained to the patient   Disposition Plan: Pending urine culture and sensitivities, likely DC home in a.m., transfer to regular floor  Time Spent in minutes  25 minutes  Procedures:    Consultants:   Urology Cardiology  Antimicrobials:   iV vancomycin 9/30  IV Zosyn 9/30   Medications  Scheduled Meds: . atorvastatin  80 mg Oral q1800  . buprenorphine-naloxone  1 tablet Sublingual BID  .  collagenase   Topical Daily  . enoxaparin (LOVENOX) injection  1 mg/kg Subcutaneous BID  . famotidine  10 mg Oral BID  . insulin aspart  0-5 Units Subcutaneous QHS  . insulin aspart  0-9 Units Subcutaneous TID WC  . insulin aspart  3 Units Subcutaneous TID WC  . insulin glargine  33 Units Subcutaneous QHS  . nicotine  21 mg Transdermal Daily   Continuous Infusions: . sodium chloride 125 mL/hr at 12/17/16 2105  . piperacillin-tazobactam (ZOSYN)  IV 3.375 g (12/18/16 0513)  . vancomycin Stopped (12/18/16 0116)   PRN Meds:.acetaminophen **OR** acetaminophen, HYDROmorphone (DILAUDID) injection, zolpidem   Antibiotics   Anti-infectives    Start     Dose/Rate Route Frequency Ordered Stop    12/17/16 0600  piperacillin-tazobactam (ZOSYN) IVPB 3.375 g     3.375 g 12.5 mL/hr over 240 Minutes Intravenous Every 8 hours 12/16/16 2345     12/17/16 0000  vancomycin (VANCOCIN) 1,250 mg in sodium chloride 0.9 % 250 mL IVPB  Status:  Discontinued     1,250 mg 166.7 mL/hr over 90 Minutes Intravenous Every 12 hours 12/16/16 2345 12/16/16 2348   12/17/16 0000  vancomycin (VANCOCIN) IVPB 1000 mg/200 mL premix     1,000 mg 200 mL/hr over 60 Minutes Intravenous Every 12 hours 12/16/16 2348     12/16/16 2315  piperacillin-tazobactam (ZOSYN) IVPB 3.375 g     3.375 g 100 mL/hr over 30 Minutes Intravenous  Once 12/16/16 2310 12/17/16 0035   12/16/16 1900  cefTRIAXone (ROCEPHIN) 1 g in dextrose 5 % 50 mL IVPB  Status:  Discontinued     1 g 100 mL/hr over 30 Minutes Intravenous  Once 12/16/16 1854 12/16/16 1855   12/16/16 1900  ciprofloxacin (CIPRO) tablet 500 mg     500 mg Oral  Once 12/16/16 1856 12/16/16 2005        Subjective:   Luke [name] was seen and examined today. Wants to smoke, no fevers and chills. No chest pain.  Patient denies dizziness, shortness of breath, abdominal pain, N/V/D/C, new weakness, numbess, tingling. No acute issues overnight.  Objective:   Vitals:   12/18/16 0400 12/18/16 0450 12/18/16 0735 12/18/16 0741  BP: 126/65   139/72  Pulse: 85   85  Resp: 18   16  Temp:    98.7 F (37.1 C)  TempSrc:   Oral Oral  SpO2: 95%   97%  Weight:  82.5 kg (181 lb 14.1 oz)    Height:        Intake/Output Summary (Last 24 hours) at 12/18/16 0917 Last data filed at 12/18/16 0807  Gross per 24 hour  Intake          1808.75 ml  Output              925 ml  Net           883.75 ml     Wt Readings from Last 3 Encounters:  12/18/16 82.5 kg (181 lb 14.1 oz)  10/31/15 82.5 kg (181 lb 12.8 oz)  08/16/15 85.3 kg (188 lb)     Exam General: Alert and oriented x 3, NAD Eyes:  HEENT:   Cardiovascular: S1 S2 auscultated, RRR. No pedal edema b/l Respiratory: Clear to  auscultation bilaterally, no wheezing, rales or rhonchi Gastrointestinal: Soft, nontender, nondistended, + bowel sounds Ext: no pedal edema bilaterally Neuro: no new deficits Musculoskeletal: No digital cyanosis, clubbing Skin: No rashes Psych: Normal affect and demeanor, alert and oriented x3  Data Reviewed:  I have personally reviewed following labs and imaging studies  Micro Results Recent Results (from the past 240 hour(s))  Urine culture     Status: Abnormal (Preliminary result)   Collection Time: 12/16/16  7:23 PM  Result Value Ref Range Status   Specimen Description URINE, RANDOM  Final   Special Requests NONE  Final   Culture (A)  Final    >=100,000 COLONIES/mL STAPHYLOCOCCUS AUREUS SUSCEPTIBILITIES TO FOLLOW    Report Status PENDING  Incomplete  MRSA PCR Screening     Status: Abnormal   Collection Time: 12/17/16  4:16 AM  Result Value Ref Range Status   MRSA by PCR POSITIVE (A) NEGATIVE Final    Comment:        The GeneXpert MRSA Assay (FDA approved for NASAL specimens only), is one component of a comprehensive MRSA colonization surveillance program. It is not intended to diagnose MRSA infection nor to guide or monitor treatment for MRSA infections. RESULT CALLED TO, READ BACK BY AND VERIFIED WITH: K PACE,RN AT 1610 12/17/16 BY L BENFIELD     Radiology Reports Dg Chest 2 View  Result Date: 12/17/2016 CLINICAL DATA:  Dyspnea. History of diabetes, sepsis. Current smoker. EXAM: CHEST  2 VIEW COMPARISON:  Portable chest x-ray of November 23, 2013 FINDINGS: The lungs are well-expanded and clear. The heart and pulmonary vascularity are normal. There is no pleural effusion. There is mild multilevel degenerative disc disease of the thoracic spine. There is mild anterior wedge compression of T10 unchanged since an abdominal and pelvic CT scan of April 11, 2014. IMPRESSION: There is no active cardiopulmonary disease. Electronically Signed   By: David  Martinique M.D.    On: 12/17/2016 15:01   Ct Abdomen Pelvis W Contrast  Result Date: 12/16/2016 CLINICAL DATA:  Urinary retention and rectal pain with lower back pain for 1 week. Fever. Suspect abscess. History of gastroesophageal reflux disease, IV drug abuse, and diabetes. EXAM: CT ABDOMEN AND PELVIS WITH CONTRAST TECHNIQUE: Multidetector CT imaging of the abdomen and pelvis was performed using the standard protocol following bolus administration of intravenous contrast. CONTRAST:  148m ISOVUE-300 IOPAMIDOL (ISOVUE-300) INJECTION 61% COMPARISON:  04/11/2014 FINDINGS: Lower chest: Mild dependent changes in the lung bases. Hepatobiliary: No focal liver abnormality is seen. No gallstones, gallbladder wall thickening, or biliary dilatation. Pancreas: Unremarkable. No pancreatic ductal dilatation or surrounding inflammatory changes. Spleen: Normal in size without focal abnormality. Adrenals/Urinary Tract: No adrenal gland nodules. Punctate size calcifications in the right kidney. No hydronephrosis or hydroureter. No ureteral stones identified. Bladder wall is not thickened and no bladder stones are identified. Stomach/Bowel: Stomach and small bowel are decompressed. Stool throughout the colon. No colonic wall thickening or distention. The appendix is normal. There is a fluid collection demonstrated anterior and to the left of the anus, extending from about the 12 o'clock to the 2 o'clock position. The fluid collection measures about 2.2 cm diameter. This is consistent with an abscess. The abscess appears to be within the prostate gland but could be in the perianal tissues. Vascular/Lymphatic: No significant vascular findings are present. No enlarged abdominal or pelvic lymph nodes. Reproductive: Prostate gland is not enlarged. Possible fluid collection within the left prostate. See above. Other: No free air or free fluid in the abdomen. Abdominal wall musculature appears intact. Musculoskeletal: Normal alignment of the lumbar spine.  Old endplate compression deformities of T10 and T11. IMPRESSION: 1. 2.2 cm circumscribed fluid collection consistent with an abscess. This may be prostatic or anterior perianal.  2. Nonobstructing intrarenal stones in the right kidney. Electronically Signed   By: Lucienne Capers M.D.   On: 12/16/2016 22:40   Nm Pulmonary Vent And Perf (v/q Scan)  Result Date: 12/17/2016 CLINICAL DATA:  Positive D-dimer.  Chest pain. EXAM: NUCLEAR MEDICINE VENTILATION - PERFUSION LUNG SCAN TECHNIQUE: Ventilation images were obtained in multiple projections using inhaled aerosol Tc-12mDTPA. Perfusion images were obtained in multiple projections after intravenous injection of Tc-918mAA. RADIOPHARMACEUTICALS:  32 mCi Technetium-9974mPA aerosol inhalation and 4.2 mCi Technetium-33m33m IV COMPARISON:  Chest x-ray dated 12/17/2016 FINDINGS: Ventilation: No focal ventilation defect. Perfusion: No wedge shaped peripheral perfusion defects to suggest acute pulmonary embolism. IMPRESSION: Normal ventilation-perfusion lung scan. Electronically Signed   By: JameLorriane Shire.   On: 12/17/2016 14:58    Lab Data:  CBC:  Recent Labs Lab 12/16/16 1947 12/17/16 0248  WBC 26.0* 22.4*  NEUTROABS 19.5*  --   HGB 11.6* 10.2*  HCT 34.4* 31.3*  MCV 82.7 82.2  PLT 372 380 865asic Metabolic Panel:  Recent Labs Lab 12/16/16 1947 12/17/16 0248  NA 128* 131*  K 5.1 4.3  CL 95* 101  CO2 22 20*  GLUCOSE 534* 403*  BUN 16 15  CREATININE 1.51* 1.46*  CALCIUM 8.6* 8.5*   GFR: Estimated Creatinine Clearance: 69.9 mL/min (A) (by C-G formula based on SCr of 1.46 mg/dL (H)). Liver Function Tests:  Recent Labs Lab 12/17/16 0248  AST 16  ALT 15*  ALKPHOS 130*  BILITOT 1.1  PROT 5.8*  ALBUMIN 2.8*   No results for input(s): LIPASE, AMYLASE in the last 168 hours. No results for input(s): AMMONIA in the last 168 hours. Coagulation Profile: No results for input(s): INR, PROTIME in the last 168 hours. Cardiac  Enzymes:  Recent Labs Lab 12/17/16 0122 12/17/16 0248 12/17/16 0723  CKTOTAL  --  202  --   CKMB  --  3.6  --   TROPONINI <0.03  --  <0.03   BNP (last 3 results) No results for input(s): PROBNP in the last 8760 hours. HbA1C:  Recent Labs  12/17/16 0122  HGBA1C 10.6*   CBG:  Recent Labs Lab 12/17/16 1621 12/17/16 1927 12/17/16 2303 12/18/16 0345 12/18/16 0739  GLUCAP 165* 199* 182* 180* 192*   Lipid Profile: No results for input(s): CHOL, HDL, LDLCALC, TRIG, CHOLHDL, LDLDIRECT in the last 72 hours. Thyroid Function Tests: No results for input(s): TSH, T4TOTAL, FREET4, T3FREE, THYROIDAB in the last 72 hours. Anemia Panel: No results for input(s): VITAMINB12, FOLATE, FERRITIN, TIBC, IRON, RETICCTPCT in the last 72 hours. Urine analysis:    Component Value Date/Time   COLORURINE AMBER (A) 12/16/2016 1556   APPEARANCEUR CLOUDY (A) 12/16/2016 1556   APPEARANCEUR Clear 04/11/2014 0138   LABSPEC 1.025 12/16/2016 1556   LABSPEC 1.026 04/11/2014 0138   PHURINE 5.0 12/16/2016 1556   GLUCOSEU NEGATIVE 12/16/2016 1556   GLUCOSEU >=500 04/11/2014 0138   HGBUR SMALL (A) 12/16/2016 1556   BILIRUBINUR NEGATIVE 12/16/2016 1556   BILIRUBINUR negative 10/31/2015 1236   BILIRUBINUR Negative 04/11/2014 0138   KETONESUR 5 (A) 12/16/2016 1556   PROTEINUR 100 (A) 12/16/2016 1556   UROBILINOGEN 0.2 10/31/2015 1236   UROBILINOGEN 0.2 12/10/2013 1221   NITRITE NEGATIVE 12/16/2016 1556   LEUKOCYTESUR LARGE (A) 12/16/2016 1556   LEUKOCYTESUR Negative 04/11/2014 0138     Jackilyn Umphlett M.D. [facility] 12/18/2016, 9:17 AM  Pager: [phone] 7am to 7pm - call Pager - [phone]  After 7pm go to  [email or web address] - password [email or web address]  Call night coverage person covering after 7pm

## 2016-12-18 NOTE — Progress Notes (Signed)
Called 2W at 1050 to speak with the Nurse for room 15 (assigned) to receive report for transfer of Vasili, Fok was the Nurse assigned, answered and asked if she could call back in three minutes. I called back at 1115 and she was unavailable, Ulice Dash answered and then asked if she could call back in three minutes.

## 2016-12-18 NOTE — Progress Notes (Signed)
Staph in urine Continue to follow

## 2016-12-18 NOTE — Progress Notes (Signed)
Inpatient Diabetes Program Recommendations  AACE/ADA: New Consensus Statement on Inpatient Glycemic Control (2015)  Target Ranges:  Prepandial:   less than 140 mg/dL      Peak postprandial:   less than 180 mg/dL (1-2 hours)      Critically ill patients:  140 - 180 mg/dL   Results for BRAYANT, DORR (MRN [id number]) as of 12/18/2016 08:23  Ref. Range 12/17/2016 04:31 12/17/2016 07:45 12/17/2016 12:47 12/17/2016 16:21 12/17/2016 19:27 12/17/2016 23:03 12/18/2016 03:45 12/18/2016 07:39  Glucose-Capillary Latest Ref Range: 65 - 99 mg/dL 428 (H) 405 (H) 214 (H) 165 (H) 199 (H) 182 (H) 180 (H) 192 (H)   Review of Glycemic Control  Diabetes history: DM1 Outpatient Diabetes medications:  70/30 50 units QAM, 70/30 40 units QPM, Novolog 5 units TID with meals Current orders for Inpatient glycemic control: Lantus 30 units QHS, Novolog 0-9 units TID with meals, Novolog 0-5 units QHS, Novolog 3 units TID with meals for meal coverage  Inpatient Diabetes Program Recommendations: Insulin - Basal: Please consider increasing Lantus to 33 units QHS.  Thanks, Barnie Alderman, RN, MSN, CDE Diabetes Coordinator Inpatient Diabetes Program [phone] (Team Pager from 8am to 5pm)

## 2016-12-18 NOTE — Progress Notes (Signed)
Dr. Tana Coast notified pt continues to have loose stool (3-4 in the past 2 hours). Verbal order received to obtain stool sample to test for c-diff. Enteric precautions initiated. Pt and family updated & aware of need for sample. Will continue to monitor.

## 2016-12-18 NOTE — Progress Notes (Addendum)
Dr. Tana Coast notified pt reports diarrhea & inquired about anti-diarrheal. Per MD, if continues to have loose stools, will order c-diff screen. MD does not wish to order anti-diarrheal at this time. Pt updated. Will continue to monitor.

## 2016-12-18 NOTE — Care Management Note (Signed)
Case Management Note  Patient Details  Name: Luke [name] MRN: [id number] Date of Birth: 01-Nov-1980  Subjective/Objective:    From home with wife, pta indep, he is established with [facility] , has follow up apt scheduled for 11/12 at 9:30 , he would like to speak with CSW for resources.  Presents with Sepsis, Drug IV abuse, nicotine abuse, tachycardia, chest pain, DM2, AKI, Anemia.                  Action/Plan: NCM will follow for dc needs.   Expected Discharge Date:  12/20/16               Expected Discharge Plan:  Home/Self Care  In-House Referral:  Clinical Social Work  Discharge planning Services  CM Consult, [facility], Medication Assistance, Follow-up appt scheduled  Post Acute Care Choice:    Choice offered to:     DME Arranged:    DME Agency:     HH Arranged:    [place] Agency:     Status of Service:  Completed, signed off  If discussed at [employer], dates discussed:    Additional Comments:  Zenon Mayo, RN 12/18/2016, 11:58 AM

## 2016-12-18 NOTE — Progress Notes (Signed)
Progress Note  Patient Name: NYREE YONKER Date of Encounter: 12/18/2016  Primary Cardiologist: New  Subjective   No chest pain  Inpatient Medications    Scheduled Meds: . atorvastatin  80 mg Oral q1800  . buprenorphine-naloxone  1 tablet Sublingual BID  . collagenase   Topical Daily  . enoxaparin (LOVENOX) injection  1 mg/kg Subcutaneous BID  . famotidine  10 mg Oral BID  . insulin aspart  0-5 Units Subcutaneous QHS  . insulin aspart  0-9 Units Subcutaneous TID WC  . insulin aspart  3 Units Subcutaneous TID WC  . insulin glargine  30 Units Subcutaneous QHS   Continuous Infusions: . sodium chloride 125 mL/hr at 12/17/16 2105  . piperacillin-tazobactam (ZOSYN)  IV 3.375 g (12/18/16 0513)  . vancomycin Stopped (12/18/16 0116)   PRN Meds: acetaminophen **OR** acetaminophen, HYDROmorphone (DILAUDID) injection, zolpidem   Vital Signs    Vitals:   12/18/16 0400 12/18/16 0450 12/18/16 0735 12/18/16 0741  BP: 126/65   139/72  Pulse: 85   85  Resp: 18   16  Temp:    98.7 F (37.1 C)  TempSrc:   Oral Oral  SpO2: 95%   97%  Weight:  181 lb 14.1 oz (82.5 kg)    Height:        Intake/Output Summary (Last 24 hours) at 12/18/16 0818 Last data filed at 12/18/16 5361  Gross per 24 hour  Intake          1808.75 ml  Output              925 ml  Net           883.75 ml   Filed Weights   12/17/16 0422 12/17/16 0426 12/18/16 0450  Weight: 176 lb 12.9 oz (80.2 kg) 177 lb 11.1 oz (80.6 kg) 181 lb 14.1 oz (82.5 kg)    Telemetry    NSR 12/18/2016  - Personally Reviewed  ECG    SR LVH chronic J point elevation  - Personally Reviewed  Physical Exam  Chronically ill young male  GEN: No acute distress.   Neck: No JVD Cardiac: RRR, no murmurs, rubs, or gallops.  Respiratory: Clear to auscultation bilaterally. GI: Soft, nontender, non-distended  MS: No edema; No deformity. Neuro:  Nonfocal  Psych: Normal affect  Edema with multiple full thickness wounds in both  [place] Lab 12/16/16 1947 12/17/16 0248  NA 128* 131*  K 5.1 4.3  CL 95* 101  CO2 22 20*  GLUCOSE 534* 403*  BUN 16 15  CREATININE 1.51* 1.46*  CALCIUM 8.6* 8.5*  PROT  --  5.8*  ALBUMIN  --  2.8*  AST  --  16  ALT  --  15*  ALKPHOS  --  130*  BILITOT  --  1.1  GFRNONAA 58* >60  GFRAA >60 >60  ANIONGAP 11 10     Hematology Recent Labs Lab 12/16/16 1947 12/17/16 0248  WBC 26.0* 22.4*  RBC 4.16* 3.81*  HGB 11.6* 10.2*  HCT 34.4* 31.3*  MCV 82.7 82.2  MCH 27.9 26.8  MCHC 33.7 32.6  RDW 13.4 13.4  PLT 372 380    Cardiac Enzymes Recent Labs Lab 12/17/16 0122 12/17/16 0723  TROPONINI <0.03 <0.03   No results for input(s): TROPIPOC in the last 168 hours.   BNPNo results for input(s): BNP, PROBNP in the last 168 hours.   DDimer  Recent Labs Lab 12/17/16 [phone]  DDIMER 0.57*     Radiology    Dg Chest 2 View  Result Date: 12/17/2016 CLINICAL DATA:  Dyspnea. History of diabetes, sepsis. Current smoker. EXAM: CHEST  2 VIEW COMPARISON:  Portable chest x-ray of November 23, 2013 FINDINGS: The lungs are well-expanded and clear. The heart and pulmonary vascularity are normal. There is no pleural effusion. There is mild multilevel degenerative disc disease of the thoracic spine. There is mild anterior wedge compression of T10 unchanged since an abdominal and pelvic CT scan of April 11, 2014. IMPRESSION: There is no active cardiopulmonary disease. Electronically Signed   By: David  Martinique M.D.   On: 12/17/2016 15:01   Ct Abdomen Pelvis W Contrast  Result Date: 12/16/2016 CLINICAL DATA:  Urinary retention and rectal pain with lower back pain for 1 week. Fever. Suspect abscess. History of gastroesophageal reflux disease, IV drug abuse, and diabetes. EXAM: CT ABDOMEN AND PELVIS WITH CONTRAST TECHNIQUE: Multidetector CT imaging of the abdomen and pelvis was performed using the standard protocol following bolus administration of intravenous  contrast. CONTRAST:  110mL ISOVUE-300 IOPAMIDOL (ISOVUE-300) INJECTION 61% COMPARISON:  04/11/2014 FINDINGS: Lower chest: Mild dependent changes in the lung bases. Hepatobiliary: No focal liver abnormality is seen. No gallstones, gallbladder wall thickening, or biliary dilatation. Pancreas: Unremarkable. No pancreatic ductal dilatation or surrounding inflammatory changes. Spleen: Normal in size without focal abnormality. Adrenals/Urinary Tract: No adrenal gland nodules. Punctate size calcifications in the right kidney. No hydronephrosis or hydroureter. No ureteral stones identified. Bladder wall is not thickened and no bladder stones are identified. Stomach/Bowel: Stomach and small bowel are decompressed. Stool throughout the colon. No colonic wall thickening or distention. The appendix is normal. There is a fluid collection demonstrated anterior and to the left of the anus, extending from about the 12 o'clock to the 2 o'clock position. The fluid collection measures about 2.2 cm diameter. This is consistent with an abscess. The abscess appears to be within the prostate gland but could be in the perianal tissues. Vascular/Lymphatic: No significant vascular findings are present. No enlarged abdominal or pelvic lymph nodes. Reproductive: Prostate gland is not enlarged. Possible fluid collection within the left prostate. See above. Other: No free air or free fluid in the abdomen. Abdominal wall musculature appears intact. Musculoskeletal: Normal alignment of the lumbar spine. Old endplate compression deformities of T10 and T11. IMPRESSION: 1. 2.2 cm circumscribed fluid collection consistent with an abscess. This may be prostatic or anterior perianal. 2. Nonobstructing intrarenal stones in the right kidney. Electronically Signed   By: Lucienne Capers M.D.   On: 12/16/2016 22:40   Nm Pulmonary Vent And Perf (v/q Scan)  Result Date: 12/17/2016 CLINICAL DATA:  Positive D-dimer.  Chest pain. EXAM: NUCLEAR MEDICINE  VENTILATION - PERFUSION LUNG SCAN TECHNIQUE: Ventilation images were obtained in multiple projections using inhaled aerosol Tc-46m DTPA. Perfusion images were obtained in multiple projections after intravenous injection of Tc-68m MAA. RADIOPHARMACEUTICALS:  32 mCi Technetium-6m DTPA aerosol inhalation and 4.2 mCi Technetium-21m MAA IV COMPARISON:  Chest x-ray dated 12/17/2016 FINDINGS: Ventilation: No focal ventilation defect. Perfusion: No wedge shaped peripheral perfusion defects to suggest acute pulmonary embolism. IMPRESSION: Normal ventilation-perfusion lung scan. Electronically Signed   By: Lorriane Shire M.D.   On: 12/17/2016 14:58    Cardiac Studies   Echo: EF 60-65% no SBE / vegetations  Patient Profile     36 y.o. male admitted with sepsis and iv drug use. SSCP per primary service interview R/O chronic LVH on ECG Echo with no RWMA;s and  troponin negative   Assessment & Plan    1) Chest Pain: non cardiac no need for further w/u 2) Sepsis: on Zosyn plan per primary service concern for upper arm wounds as well  3) D dimer: elevated but V/Q negative respiratory status fine no cor pulmonale on echo   Will sign off    For questions or updates, please contact [facility] Please consult [email or web address] for contact info under Cardiology/STEMI.      Signed, Jenkins Rouge, MD  12/18/2016, 8:18 AM

## 2016-12-19 DIAGNOSIS — F17210 Nicotine dependence, cigarettes, uncomplicated: Secondary | ICD-10-CM

## 2016-12-19 DIAGNOSIS — E1140 Type 2 diabetes mellitus with diabetic neuropathy, unspecified: Secondary | ICD-10-CM

## 2016-12-19 DIAGNOSIS — A4902 Methicillin resistant Staphylococcus aureus infection, unspecified site: Secondary | ICD-10-CM

## 2016-12-19 DIAGNOSIS — R339 Retention of urine, unspecified: Secondary | ICD-10-CM

## 2016-12-19 DIAGNOSIS — N179 Acute kidney failure, unspecified: Secondary | ICD-10-CM

## 2016-12-19 DIAGNOSIS — Z8249 Family history of ischemic heart disease and other diseases of the circulatory system: Secondary | ICD-10-CM

## 2016-12-19 DIAGNOSIS — E1142 Type 2 diabetes mellitus with diabetic polyneuropathy: Secondary | ICD-10-CM

## 2016-12-19 DIAGNOSIS — G629 Polyneuropathy, unspecified: Secondary | ICD-10-CM

## 2016-12-19 DIAGNOSIS — A409 Streptococcal sepsis, unspecified: Secondary | ICD-10-CM

## 2016-12-19 DIAGNOSIS — Z794 Long term (current) use of insulin: Secondary | ICD-10-CM

## 2016-12-19 DIAGNOSIS — N412 Abscess of prostate: Secondary | ICD-10-CM

## 2016-12-19 DIAGNOSIS — N410 Acute prostatitis: Secondary | ICD-10-CM

## 2016-12-19 DIAGNOSIS — B9562 Methicillin resistant Staphylococcus aureus infection as the cause of diseases classified elsewhere: Secondary | ICD-10-CM

## 2016-12-19 DIAGNOSIS — E1042 Type 1 diabetes mellitus with diabetic polyneuropathy: Secondary | ICD-10-CM

## 2016-12-19 DIAGNOSIS — N319 Neuromuscular dysfunction of bladder, unspecified: Secondary | ICD-10-CM

## 2016-12-19 DIAGNOSIS — E1129 Type 2 diabetes mellitus with other diabetic kidney complication: Secondary | ICD-10-CM

## 2016-12-19 DIAGNOSIS — Z833 Family history of diabetes mellitus: Secondary | ICD-10-CM

## 2016-12-19 DIAGNOSIS — R739 Hyperglycemia, unspecified: Secondary | ICD-10-CM

## 2016-12-19 DIAGNOSIS — Z818 Family history of other mental and behavioral disorders: Secondary | ICD-10-CM

## 2016-12-19 DIAGNOSIS — A4102 Sepsis due to Methicillin resistant Staphylococcus aureus: Secondary | ICD-10-CM

## 2016-12-19 DIAGNOSIS — E1165 Type 2 diabetes mellitus with hyperglycemia: Secondary | ICD-10-CM

## 2016-12-19 LAB — URINE CULTURE: Culture: >=100000 — AB

## 2016-12-19 LAB — GLUCOSE, CAPILLARY: GLUCOSE-CAPILLARY: 231 mg/dL — AB (ref 65–99)

## 2016-12-19 MED ORDER — GABAPENTIN 100 MG PO CAPS: 100.0000 mg | ORAL_CAPSULE | Freq: Three times a day (TID) | ORAL | 0 refills | Status: DC

## 2016-12-19 MED ORDER — SULFAMETHOXAZOLE-TRIMETHOPRIM 800-160 MG PO TABS: 1.0000 | ORAL_TABLET | Freq: Two times a day (BID) | ORAL | 0 refills | Status: DC

## 2016-12-19 MED ORDER — COLLAGENASE 250 UNIT/GM EX OINT: TOPICAL_OINTMENT | Freq: Every day | CUTANEOUS | 0 refills | Status: DC

## 2016-12-19 MED ORDER — INSULIN GLARGINE 100 UNIT/ML ~~LOC~~ SOLN: 33.0000 [IU] | Freq: Every day | SUBCUTANEOUS | 11 refills | Status: DC

## 2016-12-19 MED ORDER — ACETAMINOPHEN 325 MG PO TABS: 650.0000 mg | ORAL_TABLET | Freq: Four times a day (QID) | ORAL | Status: DC | PRN

## 2016-12-19 MED ORDER — SULFAMETHOXAZOLE-TRIMETHOPRIM 800-160 MG PO TABS
1.0000 | ORAL_TABLET | Freq: Two times a day (BID) | ORAL | Status: DC
  Administered 2016-12-19: 1 via ORAL
  Filled 2016-12-19: qty 1

## 2016-12-19 MED FILL — SULFAMETHOXAZOLE-TMP DS TAB: 800-160 | 28 days supply | Qty: 56 | Fill #0

## 2016-12-19 MED FILL — $LANTUS 100 UNITS/ML VIAL: 100 | 28 days supply | Qty: 10 | Fill #0

## 2016-12-19 MED FILL — GABAPENTIN 100 MG CAP: 100 | 30 days supply | Qty: 90 | Fill #0

## 2016-12-19 MED FILL — SANTYL OINTMENT: 250 | 30 days supply | Qty: 30 | Fill #0

## 2016-12-19 NOTE — Consult Note (Signed)
Date of Admission:  12/16/2016     Reason for Consult: MRSA prostatitis  with abscess  Referring Provider: Dr, Wynelle Cleveland    Assessment: 1. MRSA prostatitis with abscess 2. IVDU active 3. Poorly controlled DM 4. Hx of extensive surgeries to bilateral UE  Plan: 1. 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 male IVDU with history of extensive surgeries on her arms prior an active IV drug use with multiple lesions on his arms admitted with urinary retention found to have evidence of a prostate abscess via CT scan. He has been seen by urology felt that he could be managed with antibiotics alone. He was treated with broad-spectrum and a box the form of vancomycin and Zosyn. Cultures from the urine have yielded methicillin-resistant Staphylococcus aureus. Blood cultures have been negative to date. Patient had initial worsening renal function which is now improved during the course of his hospitalization. We are consulted to help assist with workup and management of his MRSA prostatitis with an abscess. He did admit to active IV drug use. He denied being socially active with men and specific and having receptive anal intercourse. He has not had prostatitis before.the CT scan was not clear that this was in the prostate versus the perianal area but it seems most likely that it is due to a prostate infection given the nature of his symptoms and his cultures from his urine.  He was quite anxious to leave and be discharged to Hospital asking me "are you here to let me go?"   Review of Systems: Review  of Systems  Constitutional: Positive for chills, diaphoresis, fever and malaise/fatigue.  HENT: Negative for ear discharge, hearing loss, nosebleeds and tinnitus.   Eyes: Negative for blurred vision, double vision, photophobia and discharge.  Respiratory: Negative for cough, hemoptysis, sputum production and wheezing.   Cardiovascular: Negative for chest pain, palpitations, orthopnea, claudication, leg swelling and PND.  Gastrointestinal: Positive for abdominal pain and nausea. Negative for constipation, diarrhea, heartburn and vomiting.  Genitourinary: Positive for dysuria, frequency and hematuria. Negative for urgency.  Musculoskeletal: Positive for myalgias. Negative for back pain, falls, joint pain and neck pain.  Skin: Positive for itching and rash.  Neurological: Positive for weakness and headaches. Negative for tingling, tremors, sensory change, speech change, focal weakness, seizures and loss of consciousness.  Psychiatric/Behavioral: Positive for substance abuse. Negative for hallucinations. The patient is nervous/anxious.     Past Medical History:  Diagnosis Date  . Anemia   . Anxiety  Dx 2008  . Diabetes mellitus without complication ([place]) Dx 1610  . GERD (gastroesophageal reflux disease) Dx 2008  . Headache(784.0)   . Pneumonia   . Seizures ([place]) 2011    x 2 in lifetime. on Dilantin for a while.   . 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 Smoking status: Current Every Day Smoker    Packs/day: 1.00    Types: Cigarettes  . Smokeless  tobacco: Never Used  . Alcohol use No     Comment: occasional    Family History  Problem Relation Age of Onset  . Diabetes Father   . Heart disease Father   . Mental illness Sister   . Cancer Neg Hx    No Known Allergies  OBJECTIVE: Blood pressure (!) 154/81, pulse 70, temperature 98.2 F (36.8 C), temperature source Oral, resp. rate 18, height 5\' 9"  (1.753 m), weight 182  lb 1.6 oz (82.6 kg), SpO2 100 %.  Physical Exam  Constitutional: He is well-developed, well-nourished, and in no distress. No distress.  HENT:  Head: Normocephalic.  Nose: Nose normal.  Mouth/Throat: Oropharynx is clear and moist. No oropharyngeal exudate.  Eyes: Pupils are equal, round, and reactive to light. EOM are normal. Right eye exhibits no discharge. Left eye exhibits no discharge. No scleral icterus.  Neck: Normal range of motion. Neck supple. No JVD present. No tracheal deviation present.  Cardiovascular: Normal rate, regular rhythm and normal heart sounds.  Exam reveals no gallop and no friction rub.   No murmur heard. Pulmonary/Chest: Effort normal and breath sounds normal. No respiratory distress. He has no wheezes. He has no rales. He exhibits no tenderness.  Abdominal: Soft. Bowel sounds are normal. He exhibits no distension. There is no tenderness. There is no guarding.  Musculoskeletal: Normal range of motion.  Neurological: He is alert.  Skin: He is not diaphoretic.  Psychiatric: His mood appears anxious. His affect is blunt. He is agitated.    Lab Results Lab Results  Component Value Date   WBC 22.4 (H) 12/17/2016   HGB 10.2 (L) 12/17/2016   HCT 31.3 (L) 12/17/2016   MCV 82.2 12/17/2016   PLT 380 12/17/2016    Lab Results  Component Value Date   CREATININE 1.46 (H) 12/17/2016   BUN 15 12/17/2016   NA 131 (L) 12/17/2016   K 4.3 12/17/2016   CL 101 12/17/2016   CO2 20 (L) 12/17/2016    Lab Results  Component Value Date   ALT 15 (L) 12/17/2016   AST 16 12/17/2016   ALKPHOS 130 (H) 12/17/2016   BILITOT 1.1 12/17/2016     Microbiology: Recent Results (from the past 240 hour(s))  Urine culture     Status: Abnormal   Collection Time: 12/16/16  7:23 PM  Result Value Ref Range Status   Specimen Description URINE, RANDOM  Final   Special Requests NONE  Final   Culture (A)  Final    >=100,000 COLONIES/mL METHICILLIN RESISTANT STAPHYLOCOCCUS AUREUS    Report Status 12/19/2016 FINAL  Final   Organism ID, Bacteria METHICILLIN RESISTANT STAPHYLOCOCCUS AUREUS (A)  Final      Susceptibility   Methicillin resistant staphylococcus aureus - MIC*    CIPROFLOXACIN <=0.5 SENSITIVE Sensitive     GENTAMICIN <=0.5 SENSITIVE Sensitive     NITROFURANTOIN <=16 SENSITIVE Sensitive     OXACILLIN >=4 RESISTANT Resistant     TETRACYCLINE <=1 SENSITIVE Sensitive     VANCOMYCIN <=0.5 SENSITIVE Sensitive     TRIMETH/SULFA <=10 SENSITIVE Sensitive     CLINDAMYCIN <=0.25 SENSITIVE Sensitive     RIFAMPIN <=0.5 SENSITIVE Sensitive     Inducible Clindamycin NEGATIVE Sensitive     * >=100,000 COLONIES/mL METHICILLIN RESISTANT STAPHYLOCOCCUS AUREUS  Blood culture (routine x 2)     Status: None (Preliminary result)   Collection Time: 12/16/16 11:55 PM  Result Value Ref Range Status   Specimen Description BLOOD LEFT HAND  Final   Special Requests  IN PEDIATRIC BOTTLE Blood Culture adequate volume  Final   Culture NO GROWTH 2 DAYS  Final   Report Status PENDING  Incomplete  Blood culture (routine x 2)     Status: None (Preliminary result)   Collection Time: 12/17/16 12:01 AM  Result Value Ref Range Status   Specimen Description BLOOD RIGHT HAND  Final   Special Requests IN PEDIATRIC BOTTLE Blood Culture adequate volume  Final   Culture NO GROWTH 2 DAYS  Final   Report Status PENDING  Incomplete  MRSA PCR Screening     Status: Abnormal   Collection Time: 12/17/16  4:16 AM  Result Value Ref Range Status   MRSA by PCR POSITIVE (A) NEGATIVE Final    Comment:        The GeneXpert MRSA Assay (FDA approved for NASAL specimens only), is one component of a comprehensive MRSA colonization surveillance program. It is not intended to diagnose MRSA infection nor to guide or monitor treatment for MRSA infections. RESULT CALLED TO, READ BACK BY AND VERIFIED WITH: K PACE,RN AT [id number] 12/17/16 BY L BENFIELD   C difficile quick scan w PCR reflex     Status: None    Collection Time: 12/18/16  5:25 PM  Result Value Ref Range Status   C Diff antigen NEGATIVE NEGATIVE Final   C Diff toxin NEGATIVE NEGATIVE Final   C Diff interpretation No C. difficile detected.  Final    Alcide Evener, [facility] [phone] pager    12/19/2016, 7:11 PM

## 2016-12-19 NOTE — Progress Notes (Signed)
All discharge instructions reviewed in detail with understanding verbalized. Pt discharged to home in stable condition.

## 2016-12-19 NOTE — Discharge Summary (Signed)
Physician Discharge Summary  Luke [name] ZOX:[id number] DOB: 1980-10-18 DOA: 12/16/2016  PCP: Boykin Nearing, MD  Admit date: 12/16/2016 Discharge date: 12/19/2016  Admitted From: home Disposition:  home   Recommendations for Outpatient Follow-up:  1. 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 No other symptoms today. Pushing to go home to pick up a tux for a wedding and was going to leave AMA.   Brief Summary: Patient is a 36 year old male with IV heroin abuse, seizures, diabetes type 2, presented with urinary retention that shot on the morning of admission, denied any dysuria or hematuria. In the ED febrile with temperature of 100.3, tachycardiac 133, WBC is 26.0, creatinine 1.5, glucose 534. CT abdomen showed 2.2 cm abscess prostatic or anterior perianal, nonobstructing intrarenal stones in the right kidney.  Hospital Course:  Principal Problem:   Sepsis ([place]) secondary to abscess, prostatic presented with acute urinary retention, fevers - sepsis >  with fever, tachycardia, leukocytosis, CT abdomen shows prostatic abscess -  Has been on IV vancomycin and Zosyn empirically  -  Urine culture showing more than 100,000 colonies of MRSA- have discussed with ID. Dr Drucilla Schmidt had recommended 4 wks of Bactrim- patient states he can afford this - f/u with [facility]  - urology following and did not recommend draining the abscess- recommending a repeat ultrasound to ensure abscess has resolved- should  do this towards the end of his antibiotic treatment course   Active Problems:   Drug abuse, IV ([name])polysubstance abuse, cocaine use - patient reports heroin use every day 1 g, last use on 9/29 but also states he is receiving Suboxone - UDS + for cocaine, opiates - HIV non-reactive - advised to quit which he states he is trying  Nicotine abuse - Placed on nicotine patch in hospital- he does not want prescriptions   Mild chest pain & tachycardia  - likely due to sepsis, elevated d-dimer, VQ scan normal -2-D echo showed EF of 60-65%, no regional wall motion abnormalities -UDS pending. Troponins negative, cardiology was consulted, recommended no further cardiac workup.     DM type 2, uncontrolled, with renal complications and peripheral neuropatthy - hemoglobin A1c 10.6, presented with CBG of 534 - discussed poor glucose control and relationship with infections and further discussed long term complications - takes Humulin 70/30 along with meal coverage but states he cannot eat lunch or check his sugars due to his type of work and sometimes has low sugars - has Lantus pens at home in the fridge - has been given Lantus in the hospital and therefore I recommend he continue Lantus at home (which will prevent frequent hypoglycemia) as long as it is not expired - does not check his sugars AC meals- has been recommended to check his sugars at lease BID AC and at bedtime and show log to PCP to help further improve glucose control - cont meal coverage NovoLog to 5 units TID AC     AKI (acute kidney injury) -  -  Likely due to sepsis, prostatic abscess, lisinopril - Creatinine improved to baseline     HTN - cont Lisinopril  Discharge Instructions  Discharge Instructions    Diet - low sodium heart healthy    Complete by:  As directed    Diet Carb Modified    Complete by:  As directed    Increase activity slowly    Complete by:  As directed      Allergies as of 12/19/2016   No Known  Allergies     Medication List    STOP taking these medications   insulin NPH Human 100 UNIT/ML injection Commonly known as:  HUMULIN N   insulin NPH-regular Human (70-30) 100 UNIT/ML injection Commonly known as:  HUMULIN 70/30     TAKE these medications   accu-chek softclix lancets 1 each by Other route 3 (three) times daily.   acetaminophen 325 MG tablet Commonly known as:  TYLENOL Take 2 tablets (650 mg total) by mouth every 6 (six) hours as needed for mild pain (or Fever >/= 101).   collagenase ointment Commonly known as:  SANTYL Apply topically daily.   gabapentin 100 MG capsule Commonly known as:  NEURONTIN Take 1 capsule (100 mg total) by mouth 3 (three) times daily. Need office visit for additional refills   glucose blood test strip Use as instructed   insulin aspart 100 UNIT/ML injection Commonly known as:  novoLOG INJECT 5 UNITS INTO THE SKIN 3 TIMES DAILY WITH MEALS. PER SLIDING SCALE   insulin glargine 100 UNIT/ML injection Commonly known as:  LANTUS Inject 0.33 mLs (33 Units total) into the skin at bedtime.   Insulin Syringe-Needle U-100 30G X 5/16" 0.5 ML Misc Commonly known as:  TRUEPLUS INSULIN SYRINGE 1 each by Other route 2 (two) times daily.   TRUEPLUS INSULIN SYRINGE 31G X 5/16" 0.5 ML Misc Generic drug:  Insulin Syringe-Needle U-100 USE AS DIRECTED TWICE DAILY   Insulin Syringes (Disposable) U-100 0.5 ML Misc 1 each by Does not apply route 2 (two) times daily.   lisinopril 10 MG tablet Commonly known as:  PRINIVIL,ZESTRIL TAKE 1 TABLET BY MOUTH DAILY. **MUST HAVE OFFICE VISIT FOR REFILLS**   Pen Needles 3/16" 31G X 5 MM Misc 1 each by Does not apply route 4 (four) times daily.   ranitidine 150 MG tablet Commonly known as:  ZANTAC Take 150 mg by mouth 2 (two) times daily.   SUBOXONE 8-2 MG Film Generic drug:  Buprenorphine HCl-Naloxone HCl Inject 1 Film into the lesion 2 (two) times daily.   sulfamethoxazole-trimethoprim 800-160 MG  tablet Commonly known as:  BACTRIM DS,SEPTRA DS Take 1 tablet by mouth every 12 (twelve) hours.   TRUE METRIX METER Devi 1 kit by Does not apply route as directed. Use as directed   TRUEPLUS LANCETS 28G Misc Use as directed      Follow-up [place] Follow up on 01/28/2017.   Why:  9:30 for hospital follow up Contact information: [place] [id number] [phone]       Bjorn Loser, MD Follow up.   Specialty:  Urology Why:  follow up in 4-5 wks Contact information: [place] [address] [phone]          No Known Allergies   Procedures/Studies:    Dg Chest 2 View  Result Date: 12/17/2016 CLINICAL DATA:  Dyspnea. History of diabetes, sepsis. Current smoker. EXAM: CHEST  2 VIEW COMPARISON:  Portable chest x-ray of November 23, 2013 FINDINGS: The lungs are well-expanded and clear. The heart and pulmonary vascularity are normal. There is no pleural effusion. There is mild multilevel degenerative disc disease of the thoracic spine. There is mild anterior wedge compression of T10 unchanged since an abdominal and pelvic CT scan of April 11, 2014. IMPRESSION: There is no active cardiopulmonary disease. Electronically Signed   By: David  Martinique M.D.   On: 12/17/2016 15:01   Ct Abdomen Pelvis W Contrast  Result Date: 12/16/2016 CLINICAL DATA:  Urinary retention and rectal pain with lower back pain for 1 week. Fever. Suspect abscess. History of gastroesophageal reflux disease, IV drug abuse, and diabetes. EXAM: CT ABDOMEN AND PELVIS WITH CONTRAST TECHNIQUE: Multidetector CT imaging of the abdomen and pelvis was performed using the standard protocol following bolus administration of intravenous contrast. CONTRAST:  163m ISOVUE-300 IOPAMIDOL (ISOVUE-300) INJECTION 61% COMPARISON:  04/11/2014 FINDINGS: Lower chest: Mild dependent changes in the lung bases. Hepatobiliary: No focal  liver abnormality is seen. No gallstones, gallbladder wall thickening, or biliary dilatation. Pancreas: Unremarkable. No pancreatic ductal dilatation or surrounding inflammatory changes. Spleen: Normal in size without focal abnormality. Adrenals/Urinary Tract: No adrenal gland nodules. Punctate size calcifications in the right kidney. No hydronephrosis or hydroureter. No ureteral stones identified. Bladder wall is not thickened and no bladder stones are identified. Stomach/Bowel: Stomach and small bowel are decompressed. Stool throughout the colon. No colonic wall thickening or distention. The appendix is normal. There is a fluid collection demonstrated anterior and to the left of the anus, extending from about the 12 o'clock to the 2 o'clock position. The fluid collection measures about 2.2 cm diameter. This is consistent with an abscess. The abscess appears to be within the prostate gland but could be in the perianal tissues. Vascular/Lymphatic: No significant vascular findings are present. No enlarged abdominal or pelvic lymph nodes. Reproductive: Prostate gland is not enlarged. Possible fluid collection within the left prostate. See above. Other: No free air or free fluid in the abdomen. Abdominal wall musculature appears intact. Musculoskeletal: Normal alignment of the lumbar spine. Old endplate compression deformities of T10 and T11. IMPRESSION: 1. 2.2 cm circumscribed fluid collection consistent with an abscess. This may be prostatic or anterior perianal. 2. Nonobstructing intrarenal stones in the right kidney. Electronically Signed   By: WLucienne CapersM.D.   On: 12/16/2016 22:40   Nm Pulmonary Vent And Perf (v/q Scan)  Result Date: 12/17/2016 CLINICAL DATA:  Positive D-dimer.  Chest pain. EXAM: NUCLEAR MEDICINE VENTILATION - PERFUSION LUNG SCAN TECHNIQUE: Ventilation images were obtained in multiple projections using inhaled aerosol Tc-968mTPA. Perfusion images were obtained in multiple projections  after intravenous injection of Tc-9948mA. RADIOPHARMACEUTICALS:  32 mCi Technetium-39m97mA aerosol inhalation and 4.2 mCi Technetium-39m 19mIV COMPARISON:  Chest x-ray dated 12/17/2016 FINDINGS: Ventilation: No focal ventilation defect. Perfusion: No wedge shaped peripheral perfusion defects to suggest acute pulmonary embolism. IMPRESSION: Normal ventilation-perfusion lung scan. Electronically Signed   By: JamesLorriane Shire   On: 12/17/2016 14:58        Discharge Exam: Vitals:   12/19/16 0447 12/19/16 1000  BP: (!) 143/71 (!) 154/81  Pulse: 67 70  Resp: 16 18  Temp: 98.6 F (37 C) 98.2 F (36.8 C)  SpO2: 99% 100%   Vitals:   12/18/16 2216 12/18/16 2333 12/19/16 0447 12/19/16 1000  BP:  135/74 (!) 143/71 (!) 154/81  Pulse: 64 66 67 70  Resp:   16 18  Temp:  98.6 F (37 C) 98.2 F (36.8 C)  TempSrc:   Oral Oral  SpO2:   99% 100%  Weight:      Height:        General: Pt is alert, awake, not in acute distress Cardiovascular: RRR, S1/S2 +, no rubs, no gallops Respiratory: CTA bilaterally, no wheezing, no rhonchi Abdominal: Soft, NT, ND, bowel sounds + Extremities: no edema, no cyanosis    The results of significant diagnostics from this hospitalization (including imaging, microbiology, ancillary and laboratory) are listed below for reference.     Microbiology: Recent Results (from the past 240 hour(s))  Urine culture     Status: Abnormal   Collection Time: 12/16/16  7:23 PM  Result Value Ref Range Status   Specimen Description URINE, RANDOM  Final   Special Requests NONE  Final   Culture (A)  Final    >=100,000 COLONIES/mL METHICILLIN RESISTANT STAPHYLOCOCCUS AUREUS   Report Status 12/19/2016 FINAL  Final   Organism ID, Bacteria METHICILLIN RESISTANT STAPHYLOCOCCUS AUREUS (A)  Final      Susceptibility   Methicillin resistant staphylococcus aureus - MIC*    CIPROFLOXACIN <=0.5 SENSITIVE Sensitive     GENTAMICIN <=0.5 SENSITIVE Sensitive     NITROFURANTOIN  <=16 SENSITIVE Sensitive     OXACILLIN >=4 RESISTANT Resistant     TETRACYCLINE <=1 SENSITIVE Sensitive     VANCOMYCIN <=0.5 SENSITIVE Sensitive     TRIMETH/SULFA <=10 SENSITIVE Sensitive     CLINDAMYCIN <=0.25 SENSITIVE Sensitive     RIFAMPIN <=0.5 SENSITIVE Sensitive     Inducible Clindamycin NEGATIVE Sensitive     * >=100,000 COLONIES/mL METHICILLIN RESISTANT STAPHYLOCOCCUS AUREUS  Blood culture (routine x 2)     Status: None (Preliminary result)   Collection Time: 12/16/16 11:55 PM  Result Value Ref Range Status   Specimen Description BLOOD LEFT HAND  Final   Special Requests IN PEDIATRIC BOTTLE Blood Culture adequate volume  Final   Culture NO GROWTH 1 DAY  Final   Report Status PENDING  Incomplete  Blood culture (routine x 2)     Status: None (Preliminary result)   Collection Time: 12/17/16 12:01 AM  Result Value Ref Range Status   Specimen Description BLOOD RIGHT HAND  Final   Special Requests IN PEDIATRIC BOTTLE Blood Culture adequate volume  Final   Culture NO GROWTH 1 DAY  Final   Report Status PENDING  Incomplete  MRSA PCR Screening     Status: Abnormal   Collection Time: 12/17/16  4:16 AM  Result Value Ref Range Status   MRSA by PCR POSITIVE (A) NEGATIVE Final    Comment:        The GeneXpert MRSA Assay (FDA approved for NASAL specimens only), is one component of a comprehensive MRSA colonization surveillance program. It is not intended to diagnose MRSA infection nor to guide or monitor treatment for MRSA infections. RESULT CALLED TO, READ BACK BY AND VERIFIED WITH: K PACE,RN AT 2286 12/17/16 BY L BENFIELD   C difficile quick scan w PCR reflex     Status: None   Collection Time: 12/18/16  5:25 PM  Result Value Ref Range Status   C Diff antigen NEGATIVE NEGATIVE Final   C Diff toxin NEGATIVE NEGATIVE Final   C Diff interpretation No C. difficile detected.  Final     Labs: BNP (last 3 results) No results for input(s): BNP in the last 8760 hours. Basic  Metabolic Panel:  Recent Labs Lab 12/16/16 1947 12/17/16 0248  NA  128* 131*  K 5.1 4.3  CL 95* 101  CO2 22 20*  GLUCOSE 534* 403*  BUN 16 15  CREATININE 1.51* 1.46*  CALCIUM 8.6* 8.5*   Liver Function Tests:  Recent Labs Lab 12/17/16 0248  AST 16  ALT 15*  ALKPHOS 130*  BILITOT 1.1  PROT 5.8*  ALBUMIN 2.8*   No results for input(s): LIPASE, AMYLASE in the last 168 hours. No results for input(s): AMMONIA in the last 168 hours. CBC:  Recent Labs Lab 12/16/16 1947 12/17/16 0248  WBC 26.0* 22.4*  NEUTROABS 19.5*  --   HGB 11.6* 10.2*  HCT 34.4* 31.3*  MCV 82.7 82.2  PLT 372 380   Cardiac Enzymes:  Recent Labs Lab 12/17/16 0122 12/17/16 0248 12/17/16 0723  CKTOTAL  --  202  --   CKMB  --  3.6  --   TROPONINI <0.03  --  <0.03   BNP: Invalid input(s): POCBNP CBG:  Recent Labs Lab 12/18/16 0345 12/18/16 0739 12/18/16 1716 12/18/16 2113 12/19/16 0757  GLUCAP 180* 192* 350* 304* 231*   D-Dimer  Recent Labs  12/17/16 0248  DDIMER 0.57*   Hgb A1c  Recent Labs  12/17/16 0122  HGBA1C 10.6*   Lipid Profile No results for input(s): CHOL, HDL, LDLCALC, TRIG, CHOLHDL, LDLDIRECT in the last 72 hours. Thyroid function studies No results for input(s): TSH, T4TOTAL, T3FREE, THYROIDAB in the last 72 hours.  Invalid input(s): FREET3 Anemia work up No results for input(s): VITAMINB12, FOLATE, FERRITIN, TIBC, IRON, RETICCTPCT in the last 72 hours. Urinalysis    Component Value Date/Time   COLORURINE AMBER (A) 12/16/2016 1556   APPEARANCEUR CLOUDY (A) 12/16/2016 1556   APPEARANCEUR Clear 04/11/2014 0138   LABSPEC 1.025 12/16/2016 1556   LABSPEC 1.026 04/11/2014 0138   PHURINE 5.0 12/16/2016 1556   GLUCOSEU NEGATIVE 12/16/2016 1556   GLUCOSEU >=500 04/11/2014 0138   HGBUR SMALL (A) 12/16/2016 1556   BILIRUBINUR NEGATIVE 12/16/2016 1556   BILIRUBINUR negative 10/31/2015 1236   BILIRUBINUR Negative 04/11/2014 0138   KETONESUR 5 (A) 12/16/2016  1556   PROTEINUR 100 (A) 12/16/2016 1556   UROBILINOGEN 0.2 10/31/2015 1236   UROBILINOGEN 0.2 12/10/2013 1221   NITRITE NEGATIVE 12/16/2016 1556   LEUKOCYTESUR LARGE (A) 12/16/2016 1556   LEUKOCYTESUR Negative 04/11/2014 0138   Sepsis Labs Invalid input(s): PROCALCITONIN,  WBC,  LACTICIDVEN Microbiology Recent Results (from the past 240 hour(s))  Urine culture     Status: Abnormal   Collection Time: 12/16/16  7:23 PM  Result Value Ref Range Status   Specimen Description URINE, RANDOM  Final   Special Requests NONE  Final   Culture (A)  Final    >=100,000 COLONIES/mL METHICILLIN RESISTANT STAPHYLOCOCCUS AUREUS   Report Status 12/19/2016 FINAL  Final   Organism ID, Bacteria METHICILLIN RESISTANT STAPHYLOCOCCUS AUREUS (A)  Final      Susceptibility   Methicillin resistant staphylococcus aureus - MIC*    CIPROFLOXACIN <=0.5 SENSITIVE Sensitive     GENTAMICIN <=0.5 SENSITIVE Sensitive     NITROFURANTOIN <=16 SENSITIVE Sensitive     OXACILLIN >=4 RESISTANT Resistant     TETRACYCLINE <=1 SENSITIVE Sensitive     VANCOMYCIN <=0.5 SENSITIVE Sensitive     TRIMETH/SULFA <=10 SENSITIVE Sensitive     CLINDAMYCIN <=0.25 SENSITIVE Sensitive     RIFAMPIN <=0.5 SENSITIVE Sensitive     Inducible Clindamycin NEGATIVE Sensitive     * >=100,000 COLONIES/mL METHICILLIN RESISTANT STAPHYLOCOCCUS AUREUS  Blood culture (routine x 2)  Status: None (Preliminary result)   Collection Time: 12/16/16 11:55 PM  Result Value Ref Range Status   Specimen Description BLOOD LEFT HAND  Final   Special Requests IN PEDIATRIC BOTTLE Blood Culture adequate volume  Final   Culture NO GROWTH 1 DAY  Final   Report Status PENDING  Incomplete  Blood culture (routine x 2)     Status: None (Preliminary result)   Collection Time: 12/17/16 12:01 AM  Result Value Ref Range Status   Specimen Description BLOOD RIGHT HAND  Final   Special Requests IN PEDIATRIC BOTTLE Blood Culture adequate volume  Final   Culture NO GROWTH  1 DAY  Final   Report Status PENDING  Incomplete  MRSA PCR Screening     Status: Abnormal   Collection Time: 12/17/16  4:16 AM  Result Value Ref Range Status   MRSA by PCR POSITIVE (A) NEGATIVE Final    Comment:        The GeneXpert MRSA Assay (FDA approved for NASAL specimens only), is one component of a comprehensive MRSA colonization surveillance program. It is not intended to diagnose MRSA infection nor to guide or monitor treatment for MRSA infections. RESULT CALLED TO, READ BACK BY AND VERIFIED WITH: K PACE,RN AT [phone] 12/17/16 BY L BENFIELD   C difficile quick scan w PCR reflex     Status: None   Collection Time: 12/18/16  5:25 PM  Result Value Ref Range Status   C Diff antigen NEGATIVE NEGATIVE Final   C Diff toxin NEGATIVE NEGATIVE Final   C Diff interpretation No C. difficile detected.  Final     Time coordinating discharge: Over 30 minutes  SIGNED:   Debbe Odea, MD  [facility] 12/19/2016, 10:57 AM Pager   If 7PM-7AM, please contact night-coverage [email or web address] Password [email or web address]

## 2016-12-22 LAB — CULTURE, BLOOD (ROUTINE X 2)
Culture: NO GROWTH
Culture: NO GROWTH
Special Requests: ADEQUATE
Special Requests: ADEQUATE

## 2016-12-25 ENCOUNTER — Ambulatory Visit: Payer: Self-pay | Attending: Family Medicine

## 2017-01-01 MED FILL — TRUEPLUS SYR 0.5ML 31GX5/16: 31G X 5/16" | 50 days supply | Qty: 100 | Fill #1

## 2017-01-01 MED FILL — TRUE METRIX TEST STRIP: 25 days supply | Qty: 100 | Fill #1

## 2017-01-08 ENCOUNTER — Other Ambulatory Visit: Payer: Self-pay | Admitting: *Deleted

## 2017-01-08 MED ORDER — INSULIN GLARGINE 100 UNIT/ML ~~LOC~~ SOLN: 33.0000 [IU] | Freq: Every day | SUBCUTANEOUS | 3 refills | Status: DC

## 2017-01-08 NOTE — Telephone Encounter (Signed)
PRINTED FOR PASS PROGRAM 

## 2017-01-28 ENCOUNTER — Ambulatory Visit: Payer: Self-pay | Attending: Family Medicine | Admitting: Family Medicine

## 2017-01-28 ENCOUNTER — Encounter: Payer: Self-pay | Admitting: Family Medicine

## 2017-01-28 VITALS — BP 126/81 | HR 99 | Temp 98.7°F | Resp 18 | Ht 69.0 in | Wt 196.2 lb

## 2017-01-28 DIAGNOSIS — E1165 Type 2 diabetes mellitus with hyperglycemia: Secondary | ICD-10-CM

## 2017-01-28 DIAGNOSIS — E1129 Type 2 diabetes mellitus with other diabetic kidney complication: Secondary | ICD-10-CM

## 2017-01-28 DIAGNOSIS — H547 Unspecified visual loss: Secondary | ICD-10-CM

## 2017-01-28 DIAGNOSIS — F1990 Other psychoactive substance use, unspecified, uncomplicated: Secondary | ICD-10-CM

## 2017-01-28 DIAGNOSIS — I10 Essential (primary) hypertension: Secondary | ICD-10-CM

## 2017-01-28 DIAGNOSIS — Z794 Long term (current) use of insulin: Secondary | ICD-10-CM | POA: Insufficient documentation

## 2017-01-28 DIAGNOSIS — IMO0002 Reserved for concepts with insufficient information to code with codable children: Secondary | ICD-10-CM

## 2017-01-28 DIAGNOSIS — Z09 Encounter for follow-up examination after completed treatment for conditions other than malignant neoplasm: Secondary | ICD-10-CM

## 2017-01-28 DIAGNOSIS — E1142 Type 2 diabetes mellitus with diabetic polyneuropathy: Secondary | ICD-10-CM

## 2017-01-28 DIAGNOSIS — Z79899 Other long term (current) drug therapy: Secondary | ICD-10-CM | POA: Insufficient documentation

## 2017-01-28 DIAGNOSIS — S41109D Unspecified open wound of unspecified upper arm, subsequent encounter: Secondary | ICD-10-CM

## 2017-01-28 DIAGNOSIS — E1140 Type 2 diabetes mellitus with diabetic neuropathy, unspecified: Secondary | ICD-10-CM

## 2017-01-28 LAB — GLUCOSE, POCT (MANUAL RESULT ENTRY): POC Glucose: 187 mg/dl — AB (ref 70–99)

## 2017-01-28 MED ORDER — GABAPENTIN 100 MG PO CAPS: 100.0000 mg | ORAL_CAPSULE | Freq: Three times a day (TID) | ORAL | 5 refills | Status: DC

## 2017-01-28 MED ORDER — COLLAGENASE 250 UNIT/GM EX OINT: TOPICAL_OINTMENT | Freq: Every day | CUTANEOUS | 0 refills | Status: DC

## 2017-01-28 MED ORDER — INSULIN GLARGINE 100 UNIT/ML ~~LOC~~ SOLN: 40.0000 [IU] | Freq: Every day | SUBCUTANEOUS | 5 refills | Status: DC

## 2017-01-28 MED ORDER — SULFAMETHOXAZOLE-TRIMETHOPRIM 800-160 MG PO TABS: 1.0000 | ORAL_TABLET | Freq: Two times a day (BID) | ORAL | 0 refills | Status: DC

## 2017-01-28 MED ORDER — TRUEPLUS LANCETS 28G MISC: 12 refills | Status: DC

## 2017-01-28 MED ORDER — LISINOPRIL 10 MG PO TABS: ORAL_TABLET | ORAL | 6 refills | Status: DC

## 2017-01-28 MED ORDER — INSULIN ASPART 100 UNIT/ML ~~LOC~~ SOLN: SUBCUTANEOUS | 5 refills | Status: DC

## 2017-01-28 MED ORDER — "INSULIN SYRINGE-NEEDLE U-100 31G X 5/16"" 0.5 ML MISC": 6 refills | Status: DC

## 2017-01-28 MED FILL — SULFAMETHOXAZOLE-TMP DS TAB: 800-160 | 21 days supply | Qty: 42 | Fill #0

## 2017-01-28 MED FILL — TRUEplus LANCETS 28G MISC: 25 days supply | Qty: 100 | Fill #0

## 2017-01-28 MED FILL — LISINOPRIL 10 MG TABS: 10 | 30 days supply | Qty: 30 | Fill #0

## 2017-01-28 MED FILL — TRUEPLUS SYR 0.5ML 31GX5/16: 31G X 5/16" | 25 days supply | Qty: 100 | Fill #0

## 2017-01-28 MED FILL — GABAPENTIN 100 MG CAPSULE: 100 | 30 days supply | Qty: 90 | Fill #0

## 2017-01-28 NOTE — Patient Instructions (Signed)
Apply for orange card to complete referral process. Apply for cone discount

## 2017-01-28 NOTE — Progress Notes (Signed)
Subjective:  Patient ID: Luke [name], male    DOB: 1980/04/19  Age: 36 y.o. MRN: [id number]  CC: Hospitalization Follow-up   HPI Luke [name] presents to reestablish care.  Past medical history of diabetes type 2, hypertension, IV drug abuse, and polysubstance abuse.  Recent history of hospitalization September 2017 for symptoms of urinary retention and mild chest pain with tachycardia.  Workup done including labs, blood cultures, CT abdomen, and cardiac echo.  EF 60-65%.  Abdominal CT showed 2.2 prostatic or anterior perianal process and nonobstructing intrarenal stones in the right kidney.  Urology was consulted.  Patient was placed on 4-week course of Bactrim antibiotic therapy it was recommended repeat abdominal CT be performed towards the end of his antibiotic course.  IV drug abuse: Heroin use.  UDS screen positive for cocaine and opiates.  He reports bilateral wounds to arm and request additional antibiotic course to treat.  He reports improvement of his symptoms with antibiotic and Santyl use.  He denies any fever or drainage. DM: Symptoms: visual disturbances- blurred vision. Onset greater than 3 months. Patient denies foot ulcerations, hyperglycemia, nausea, paresthesia of the feet, vomitting and weight loss.  Evaluation to date has been included: fasting blood sugar and hemoglobin A1C.  Home sugars: patient does not check sugars. Treatment to date: He reports taking Lantus 25 units BID. Hypertension: Cardiac symptoms none. Patient denies chest pain, dyspnea, lower extremity edema, near-syncope, palpitations and syncope.  Cardiovascular risk factors: diabetes mellitus, hypertension, male gender, obesity (BMI >= 30 kg/m2) and drug use. Use of agents associated with hypertension: amphetamines. History of target organ damage: none.    Outpatient Medications Prior to Visit  Medication Sig Dispense Refill  . acetaminophen (TYLENOL) 325 MG tablet Take 2 tablets (650 mg total) by mouth every 6  (six) hours as needed for mild pain (or Fever >/= 101).    . Blood Glucose Monitoring Suppl (TRUE METRIX METER) DEVI 1 kit by Does not apply route as directed. Use as directed 1 Device 0  . glucose blood test strip Use as instructed 100 each 12  . ranitidine (ZANTAC) 150 MG tablet Take 150 mg by mouth 2 (two) times daily.    . SUBOXONE 8-2 MG FILM Inject 1 Film into the lesion 2 (two) times daily.  0  . collagenase (SANTYL) ointment Apply topically daily. 30 g 0  . gabapentin (NEURONTIN) 100 MG capsule Take 1 capsule (100 mg total) by mouth 3 (three) times daily. Need office visit for additional refills 90 capsule 0  . insulin aspart (NOVOLOG) 100 UNIT/ML injection INJECT 5 UNITS INTO THE SKIN 3 TIMES DAILY WITH MEALS. PER SLIDING SCALE 10 mL 5  . insulin glargine (LANTUS) 100 UNIT/ML injection Inject 0.33 mLs (33 Units total) into the skin at bedtime. 30 mL 3  . Insulin Pen Needle (PEN NEEDLES 3/16") 31G X 5 MM MISC 1 each by Does not apply route 4 (four) times daily. 120 each 11  . Insulin Syringe-Needle U-100 (TRUEPLUS INSULIN SYRINGE) 30G X 5/16" 0.5 ML MISC 1 each by Other route 2 (two) times daily. 180 each 3  . Insulin Syringes, Disposable, U-100 0.5 ML MISC 1 each by Does not apply route 2 (two) times daily. 180 each 3  . Lancet Devices (ACCU-CHEK SOFTCLIX) lancets 1 each by Other route 3 (three) times daily. 1 each 0  . lisinopril (PRINIVIL,ZESTRIL) 10 MG tablet TAKE 1 TABLET BY MOUTH DAILY. **MUST HAVE OFFICE VISIT FOR REFILLS** 30 tablet 0  .  sulfamethoxazole-trimethoprim (BACTRIM DS,SEPTRA DS) 800-160 MG tablet Take 1 tablet by mouth every 12 (twelve) hours. 56 tablet 0  . TRUEPLUS INSULIN SYRINGE 31G X 5/16" 0.5 ML MISC USE AS DIRECTED TWICE DAILY 100 each 3  . TRUEPLUS LANCETS 28G MISC Use as directed 100 each 12   No facility-administered medications prior to visit.     ROS Review of Systems  Constitutional: Negative.   Eyes: Positive for visual disturbance.  Respiratory:  Negative.   Cardiovascular: Negative.   Gastrointestinal: Negative.   Genitourinary: Negative for difficulty urinating, dysuria and hematuria.  Skin: Positive for wound.  Psychiatric/Behavioral: Positive for behavioral problems (Polysubstance abuse). Negative for suicidal ideas.   Objective:  BP 126/81 (BP Location: Left Arm, Patient Position: Sitting, Cuff Size: Normal)   Pulse 99   Temp 98.7 F (37.1 C) (Oral)   Resp 18   Ht '5\' 9"'$  (1.753 m)   Wt 196 lb 3.2 oz (89 kg)   SpO2 97%   BMI 28.97 kg/m   BP/Weight 01/28/2017 12/19/2016 76/03/9507  Systolic BP 326 712 -  Diastolic BP 81 81 -  Wt. (Lbs) 196.2 - 182.1  BMI 28.97 - 26.89     Physical Exam  Constitutional: He appears well-developed and well-nourished.  Eyes: Conjunctivae and EOM are normal. Pupils are equal, round, and reactive to light.  Neck: No JVD present.  Cardiovascular: Normal rate, regular rhythm, normal heart sounds and intact distal pulses.  Pulmonary/Chest: Effort normal and breath sounds normal.  Abdominal: Soft. Bowel sounds are normal. There is no tenderness.  Musculoskeletal: Normal range of motion.  Skin: Skin is warm and dry. Lesion (bilateral antecubitals: multiple areas of area of ulcerations , no drainage , with some areas of slough present.) noted.  Psychiatric: His mood appears anxious. He expresses no homicidal and no suicidal ideation. He expresses no suicidal plans and no homicidal plans.  Nursing note and vitals reviewed.  Diabetic Foot Exam - Simple   Simple Foot Form Diabetic Foot exam was performed with the following findings:  Yes 01/28/2017 10:08 AM  Visual Inspection No deformities, no ulcerations, no other skin breakdown bilaterally:  Yes Sensation Testing Intact to touch and monofilament testing bilaterally:  Yes Pulse Check Posterior Tibialis and Dorsalis pulse intact bilaterally:  Yes Comments      Assessment & Plan:   1. Open wound of multiple sites of one upper extremity  without complication, unspecified laterality, subsequent encounter Discussed the potential risk and complications of his continued IV drug use. Will order x-ray to rule out osteomyelitis. Apply for orange card in discount program. - DG Humerus Left; Future - DG Humerus Right; Future - sulfamethoxazole-trimethoprim (BACTRIM DS,SEPTRA DS) 800-160 MG tablet; Take 1 tablet every 12 (twelve) hours by mouth.  Dispense: 42 tablet; Refill: 0 - AMB referral to wound care center - collagenase (SANTYL) ointment; Apply daily topically.  Dispense: 30 g; Refill: 0  2. IV drug user Declines to speak with the LCSW or referral at this time. - DG Humerus Left; Future - DG Humerus Right; Future  3. Uncontrolled type 2 diabetes mellitus with hyperglycemia ([place]) Start checking blood sugars before meals and at bedtime. Bring glucometer and/or blood glucose log to next office visit. - Ambulatory referral to Ophthalmology - insulin glargine (LANTUS) 100 UNIT/ML injection; Inject 0.4 mLs (40 Units total) at bedtime into the skin.  Dispense: 30 mL; Refill: 5 - insulin aspart (NOVOLOG) 100 UNIT/ML injection; CBG Less than 70,  Do not give insulin. CBG 70-120 =  0 units CBG 121-140 = 2 units CBG 141-180 =  4 units CBG 181-220 =  6 units CBG  221-260 = 8 units CBG 261-300 = 10 units CBG 301-350 = 12 units CBG 351-400 = 14 units Greater than 400 = 16 units. Recheck CBG in 30 minutes. Contact office.  Dispense: 10 mL; Refill: 5 - Insulin Syringe-Needle U-100 (TRUEPLUS INSULIN SYRINGE) 31G X 5/16" 0.5 ML MISC; USE AS DIRECTED.  Dispense: 100 each; Refill: 6 - TRUEPLUS LANCETS 28G MISC; Use as directed  Dispense: 100 each; Refill: 12  4. Diabetic polyneuropathy associated with type 2 diabetes mellitus ([name])  - Glucose (CBG) - gabapentin (NEURONTIN) 100 MG capsule; Take 1 capsule (100 mg total) 3 (three) times daily by mouth. Need office visit for additional refills  Dispense: 90 capsule; Refill: 5  5. Follow  up Recommended CT of the abdomen outpatient hospital follow-up. - CT Abdomen Pelvis W Contrast; Future  6. DM type 2, uncontrolled, with renal complications ([name])  - insulin glargine (LANTUS) 100 UNIT/ML injection; Inject 0.4 mLs (40 Units total) at bedtime into the skin.  Dispense: 30 mL; Refill: 5 - insulin aspart (NOVOLOG) 100 UNIT/ML injection; CBG Less than 70,  Do not give insulin. CBG 70-120 =   0 units CBG 121-140 = 2 units CBG 141-180 =  4 units CBG 181-220 =  6 units CBG  221-260 = 8 units CBG 261-300 = 10 units CBG 301-350 = 12 units CBG 351-400 = 14 units Greater than 400 = 16 units. Recheck CBG in 30 minutes. Contact office.  Dispense: 10 mL; Refill: 5  7. Type 2 diabetes mellitus with diabetic neuropathy, with long-term current use of insulin ([name])  - gabapentin (NEURONTIN) 100 MG capsule; Take 1 capsule (100 mg total) 3 (three) times daily by mouth. Need office visit for additional refills  Dispense: 90 capsule; Refill: 5  8. Essential hypertension  - lisinopril (PRINIVIL,ZESTRIL) 10 MG tablet; TAKE 1 TABLET BY MOUTH DAILY.  Dispense: 30 tablet; Refill: 6  9. Decreased visual acuity Vision acuity screen performed - Ambulatory referral to Ophthalmology     Follow-up: Return in about 3 weeks (around 02/18/2017) for Follow Up.   Alfonse Spruce FNP

## 2017-01-31 ENCOUNTER — Ambulatory Visit (HOSPITAL_COMMUNITY): Payer: Self-pay | Attending: Family Medicine

## 2017-02-05 MED FILL — !NOVOLOG 100UNITS/ML VIAL: 100/ML | 28 days supply | Qty: 10 | Fill #1

## 2017-02-18 ENCOUNTER — Encounter: Payer: Self-pay | Admitting: Family Medicine

## 2017-02-18 ENCOUNTER — Ambulatory Visit: Payer: Self-pay | Attending: Family Medicine | Admitting: Family Medicine

## 2017-02-18 VITALS — BP 117/79 | HR 114 | Temp 98.4°F | Resp 18 | Ht 69.0 in | Wt 193.8 lb

## 2017-02-18 DIAGNOSIS — E669 Obesity, unspecified: Secondary | ICD-10-CM | POA: Insufficient documentation

## 2017-02-18 DIAGNOSIS — M869 Osteomyelitis, unspecified: Secondary | ICD-10-CM | POA: Insufficient documentation

## 2017-02-18 DIAGNOSIS — I10 Essential (primary) hypertension: Secondary | ICD-10-CM | POA: Insufficient documentation

## 2017-02-18 DIAGNOSIS — H538 Other visual disturbances: Secondary | ICD-10-CM | POA: Insufficient documentation

## 2017-02-18 DIAGNOSIS — Z9189 Other specified personal risk factors, not elsewhere classified: Secondary | ICD-10-CM

## 2017-02-18 DIAGNOSIS — E1165 Type 2 diabetes mellitus with hyperglycemia: Secondary | ICD-10-CM | POA: Insufficient documentation

## 2017-02-18 DIAGNOSIS — T07XXXA Unspecified multiple injuries, initial encounter: Secondary | ICD-10-CM

## 2017-02-18 DIAGNOSIS — Z87438 Personal history of other diseases of male genital organs: Secondary | ICD-10-CM

## 2017-02-18 DIAGNOSIS — Z794 Long term (current) use of insulin: Secondary | ICD-10-CM | POA: Insufficient documentation

## 2017-02-18 DIAGNOSIS — Z79899 Other long term (current) drug therapy: Secondary | ICD-10-CM | POA: Insufficient documentation

## 2017-02-18 DIAGNOSIS — F1990 Other psychoactive substance use, unspecified, uncomplicated: Secondary | ICD-10-CM

## 2017-02-18 DIAGNOSIS — E1142 Type 2 diabetes mellitus with diabetic polyneuropathy: Secondary | ICD-10-CM

## 2017-02-18 LAB — POCT URINALYSIS DIPSTICK
BILIRUBIN UA: NEGATIVE
Blood, UA: NEGATIVE
GLUCOSE UA: 500
Ketones, UA: NEGATIVE
LEUKOCYTES UA: NEGATIVE
NITRITE UA: NEGATIVE
Protein, UA: 30
Spec Grav, UA: 1.015 (ref 1.010–1.025)
Urobilinogen, UA: 0.2 E.U./dL
pH, UA: 6 (ref 5.0–8.0)

## 2017-02-18 LAB — GLUCOSE, POCT (MANUAL RESULT ENTRY)
POC GLUCOSE: 359 mg/dL — AB (ref 70–99)
POC Glucose: 435 mg/dl — AB (ref 70–99)
POC Glucose: 434 mg/dl — AB (ref 70–99)

## 2017-02-18 MED ORDER — INSULIN ASPART 100 UNIT/ML ~~LOC~~ SOLN
20.0000 [IU] | Freq: Once | SUBCUTANEOUS | Status: AC
  Administered 2017-02-18: 20 [IU] via SUBCUTANEOUS

## 2017-02-18 MED ORDER — INSULIN ASPART 100 UNIT/ML ~~LOC~~ SOLN: SUBCUTANEOUS | 5 refills | Status: DC

## 2017-02-18 MED ORDER — INSULIN ASPART 100 UNIT/ML ~~LOC~~ SOLN
5.0000 [IU] | Freq: Once | SUBCUTANEOUS | Status: AC
  Administered 2017-02-18: 5 [IU] via SUBCUTANEOUS

## 2017-02-18 MED ORDER — INSULIN NPH ISOPHANE & REGULAR (70-30) 100 UNIT/ML ~~LOC~~ SUSP: SUBCUTANEOUS | 5 refills | Status: DC

## 2017-02-18 NOTE — Progress Notes (Signed)
Subjective:  Patient ID: Luke [name], male    DOB: 1981-01-31  Age: 36 y.o. MRN: [id number]  CC: Follow-up   HPI EASON HOUSMAN presents to reestablish care.  Past medical history of diabetes type 2, hypertension, IV drug abuse, and polysubstance abuse.  Recent history of hospitalization September 2017 for symptoms of urinary retention and mild chest pain with tachycardia.  Workup done including labs, blood cultures, CT abdomen, and cardiac echo.  EF 60-65%.  Abdominal CT showed 2.2 prostatic or anterior perianal process and nonobstructing intrarenal stones in the right kidney.  Urology was consulted.  Patient was placed on 4-week course of Bactrim antibiotic therapy it was recommended repeat abdominal CT be performed towards the end of his antibiotic course.  CT ordered. Patient did not follow up.  IV drug abuse: Heroin use.  UDS screen positive for cocaine and opiates. History of  bilateral wounds to arm additional course of antibiotics were given and xray imaging ordered due to risk of osteomyelitis. He reports not following up with xray imaging. He reports improvement of his symptoms with antibiotic and Santyl use. He denies any fever or drainage. DM: Symptoms: visual disturbances- blurred vision.  Onset greater than 3 months.Referral to ophthalmology. He has not applied for orange card . Patient denies foot ulcerations, hyperglycemia, nausea, paresthesia of the feet, vomitting and weight loss.  Evaluation to date has been included: fasting blood sugar and hemoglobin A1C.  Home sugars: he reporst reading in the 300's. He does not bring glucometer or log to office visit. Reports readings of 180's to 200's when he was taking novolin R 50 units in am and 40 in pm.  Hypertension: Cardiac symptoms none. Patient denies chest pain, dyspnea, lower extremity edema, near-syncope, palpitations and syncope.  Cardiovascular risk factors: diabetes mellitus, hypertension, male gender, obesity (BMI >= 30 kg/m2) and drug  use. Use of agents associated with hypertension: amphetamines. History of target organ damage: none.  Outpatient Medications Prior to Visit  Medication Sig Dispense Refill  . acetaminophen (TYLENOL) 325 MG tablet Take 2 tablets (650 mg total) by mouth every 6 (six) hours as needed for mild pain (or Fever >/= 101).    . Blood Glucose Monitoring Suppl (TRUE METRIX METER) DEVI 1 kit by Does not apply route as directed. Use as directed 1 Device 0  . collagenase (SANTYL) ointment Apply daily topically. 30 g 0  . gabapentin (NEURONTIN) 100 MG capsule Take 1 capsule (100 mg total) 3 (three) times daily by mouth. Need office visit for additional refills 90 capsule 5  . glucose blood test strip Use as instructed 100 each 12  . Insulin Syringe-Needle U-100 (TRUEPLUS INSULIN SYRINGE) 31G X 5/16" 0.5 ML MISC USE AS DIRECTED. 100 each 6  . lisinopril (PRINIVIL,ZESTRIL) 10 MG tablet TAKE 1 TABLET BY MOUTH DAILY. 30 tablet 6  . ranitidine (ZANTAC) 150 MG tablet Take 150 mg by mouth 2 (two) times daily.    . SUBOXONE 8-2 MG FILM Inject 1 Film into the lesion 2 (two) times daily.  0  . sulfamethoxazole-trimethoprim (BACTRIM DS,SEPTRA DS) 800-160 MG tablet Take 1 tablet every 12 (twelve) hours by mouth. 42 tablet 0  . TRUEPLUS LANCETS 28G MISC Use as directed 100 each 12  . insulin aspart (NOVOLOG) 100 UNIT/ML injection CBG Less than 70,  Do not give insulin. CBG 70-120 =   0 units CBG 121-140 = 2 units CBG 141-180 =  4 units CBG 181-220 =  6 units CBG  221-260 =  8 units CBG 261-300 = 10 units CBG 301-350 = 12 units CBG 351-400 = 14 units Greater than 400 = 16 units. Recheck CBG in 30 minutes. Contact office. 10 mL 5  . insulin glargine (LANTUS) 100 UNIT/ML injection Inject 0.4 mLs (40 Units total) at bedtime into the skin. 30 mL 5   No facility-administered medications prior to visit.     ROS Review of Systems  Constitutional: Negative.   Eyes: Positive for visual disturbance.  Respiratory:  Negative.   Cardiovascular: Negative.   Gastrointestinal: Negative.   Genitourinary: Negative for difficulty urinating, dysuria and hematuria.  Skin: Positive for wound.  Psychiatric/Behavioral: Positive for behavioral problems (Polysubstance abuse). Negative for suicidal ideas.   Objective:  BP 117/79 (BP Location: Left Arm, Patient Position: Sitting, Cuff Size: Normal)   Pulse (!) 114   Temp 98.4 F (36.9 C) (Oral)   Resp 18   Ht '5\' 9"'$  (1.753 m)   Wt 193 lb 12.8 oz (87.9 kg)   HC 18" (45.7 cm)   SpO2 97%   BMI 28.62 kg/m   BP/Weight 02/18/2017 01/28/2017 59/07/6385  Systolic BP 564 332 951  Diastolic BP 79 81 81  Wt. (Lbs) 193.8 196.2 -  BMI 28.62 28.97 -     Physical Exam  Constitutional: He appears well-developed and well-nourished.  Eyes: Conjunctivae and EOM are normal. Pupils are equal, round, and reactive to light.  Neck: No JVD present.  Cardiovascular: Normal rate, regular rhythm, normal heart sounds and intact distal pulses.  Pulmonary/Chest: Effort normal and breath sounds normal.  Abdominal: Soft. Bowel sounds are normal. There is no tenderness.  Musculoskeletal: Normal range of motion.  Skin: Skin is warm and dry. Lesion (bilateral antecubitals: multiple areas of area of dry ulcerations , no drainage; eschar also located to [facility].) noted.  Psychiatric: His mood appears anxious. He expresses no homicidal and no suicidal ideation. He expresses no suicidal plans and no homicidal plans.  Nursing note and vitals reviewed.   Assessment & Plan:   1. Uncontrolled type 2 diabetes mellitus with hyperglycemia ([name]) D/c lantus place back on novolin 70/30 am and pm.  Meal coverage added. Start checking blood sugars before meals and at bedtime. Bring glucometer and/or blood glucose log to next office visit. - Glucose (CBG) - Urinalysis Dipstick - insulin aspart (novoLOG) injection 20 Units - insulin NPH-regular Human (NOVOLIN 70/30) (70-30) 100 UNIT/ML injection; Inject  50 units sbq daily with breakfast. Inject 40 units sbq daily with supper.  Dispense: 30 mL; Refill: 5 - insulin aspart (NOVOLOG) 100 UNIT/ML injection; Administer insulin sbq injection with meals. CBG Less than 70,  Do not give insulin. CBG 70-140 =   0 units CBG  200 or greater = Give 5 units with meals.  Dispense: 10 mL; Refill: 5 - AMB referral to wound care center - Ambulatory referral to Ophthalmology - Glucose (CBG) - insulin aspart (novoLOG) injection 5 Units - Glucose (CBG)  2. At risk for osteomyelitis Discussed the potential risk and complications of his continued IV drug use. Will order x-ray to rule out osteomyelitis. Apply for orange card in discount program. Declines to speak with the LCSW or referral at this time - DG Humerus Left; Future - DG Humerus Right; Future - AMB referral to wound care center  3. Multiple wounds Will order x-ray to rule out osteomyelitis. Apply for orange card in discount program. - DG Humerus Left; Future - DG Humerus Right; Future - AMB referral to wound care center  4.  IV drug user Discussed the potential risk and complications of his continued IV drug use. Declines to speak with the LCSW or referral at this time - DG Humerus Left; Future - DG Humerus Right; Future  5. Essential hypertension BP controlled on current dosage of lisinopril  6. History of prostatitis Recommended CT of the abdomen outpatient hospital follow-up. - CT Abdomen Pelvis W Contrast; Future    Follow-up: Return in about 2 weeks (around 03/04/2017) for DM with Stacy.   Alfonse Spruce FNP

## 2017-02-18 NOTE — Patient Instructions (Addendum)
Apply for orange card and discount program.  Hyperglycemia Hyperglycemia is when the sugar (glucose) level in your blood is too high. It may not cause symptoms. If you do have symptoms, they may include warning signs, such as:  Feeling more thirsty than normal.  Hunger.  Feeling tired.  Needing to pee (urinate) more than normal.  Blurry eyesight (vision).  You may get other symptoms as it gets worse, such as:  Dry mouth.  Not being hungry (loss of appetite).  Fruity-smelling breath.  Weakness.  Weight gain or loss that is not planned. Weight loss may be fast.  A tingling or numb feeling in your hands or feet.  Headache.  Skin that does not bounce back quickly when it is lightly pinched and released (poor skin turgor).  Pain in your belly (abdomen).  Cuts or bruises that heal slowly.  High blood sugar can happen to people who do or do not have diabetes. High blood sugar can happen slowly or quickly, and it can be an emergency. Follow these instructions at home: General instructions  Take over-the-counter and prescription medicines only as told by your doctor.  Do not use products that contain nicotine or tobacco, such as cigarettes and e-cigarettes. If you need help quitting, ask your doctor.  Limit alcohol intake to no more than 1 drink per day for nonpregnant women and 2 drinks per day for men. One drink equals 12 oz of beer, 5 oz of wine, or 1 oz of hard liquor.  Manage stress. If you need help with this, ask your doctor.  Keep all follow-up visits as told by your doctor. This is important. Eating and drinking  Stay at a healthy weight.  Exercise regularly, as told by your doctor.  Drink enough fluid, especially when you: ? Exercise. ? Get sick. ? Are in hot temperatures.  Eat healthy foods, such as: ? Low-fat (lean) proteins. ? Complex carbs (complex carbohydrates), such as whole wheat bread or brown rice. ? Fresh fruits and vegetables. ? Low-fat  dairy products. ? Healthy fats.  Drink enough fluid to keep your pee (urine) clear or pale yellow. If you have diabetes:  Make sure you know the symptoms of hyperglycemia.  Follow your diabetes management plan, as told by your doctor. Make sure you: ? Take insulin and medicines as told. ? Follow your exercise plan. ? Follow your meal plan. Eat on time. Do not skip meals. ? Check your blood sugar as often as told. Make sure to check before and after exercise. If you exercise longer or in a different way than you normally do, check your blood sugar more often. ? Follow your sick day plan whenever you cannot eat or drink normally. Make this plan ahead of time with your doctor.  Share your diabetes management plan with people in your workplace, school, and household.  Check your urine for ketones when you are ill and as told by your doctor.  Carry a card or wear jewelry that says that you have diabetes. Contact a doctor if:  Your blood sugar level is higher than 240 mg/dL (13.3 mmol/L) for 2 days in a row.  You have problems keeping your blood sugar in your target range.  High blood sugar happens often for you. Get help right away if:  You have trouble breathing.  You have a change in how you think, feel, or act (mental status).  You feel sick to your stomach (nauseous), and that feeling does not go away.  You  cannot stop throwing up (vomiting). These symptoms may be an emergency. Do not wait to see if the symptoms will go away. Get medical help right away. Call your local emergency services (911 in the [place] Do not drive yourself to the hospital. Summary  Hyperglycemia is when the sugar (glucose) level in your blood is too high.  High blood sugar can happen to people who do or do not have diabetes.  Make sure you drink enough fluids, eat healthy foods, and exercise regularly.  Contact your doctor if you have problems keeping your blood sugar in your target range. This  information is not intended to replace advice given to you by your health care provider. Make sure you discuss any questions you have with your health care provider. Document Released: 12/31/2008 Document Revised: 11/21/2015 Document Reviewed: 11/21/2015 Elsevier Interactive Patient Education  2017 [employer].

## 2017-02-19 MED FILL — !NOVOLOG 100UNITS/ML VIAL: 100/ML | 28 days supply | Qty: 10 | Fill #0

## 2017-02-19 MED FILL — ?HUMULIN 70/30 VIAL: (70-30) 100 | 33 days supply | Qty: 30 | Fill #0

## 2017-02-25 ENCOUNTER — Ambulatory Visit: Payer: Self-pay

## 2017-02-28 MED FILL — GABAPENTIN 100 MG CAPSULE: 100 | 30 days supply | Qty: 90 | Fill #1

## 2017-03-05 ENCOUNTER — Ambulatory Visit: Payer: Self-pay | Admitting: Pharmacist

## 2017-03-27 MED FILL — ?HUMULIN 70/30 VIAL: (70-30) 100 | 33 days supply | Qty: 30 | Fill #1

## 2017-04-02 MED FILL — GABAPENTIN 100 MG CAPSULE: 100 | 30 days supply | Qty: 90 | Fill #2

## 2017-04-26 MED FILL — ?HUMULIN 70/30 VIAL: (70-30) 100 | 33 days supply | Qty: 30 | Fill #2

## 2017-05-01 MED FILL — GABAPENTIN 100 MG CAPSULE: 100 | 30 days supply | Qty: 90 | Fill #3

## 2017-05-01 MED FILL — TRUEPLUS SYR 0.5ML 31GX5/16: 31G X 5/16" | 25 days supply | Qty: 100 | Fill #1

## 2017-05-15 ENCOUNTER — Ambulatory Visit: Payer: Self-pay

## 2017-05-15 ENCOUNTER — Ambulatory Visit: Payer: Self-pay | Attending: Internal Medicine | Admitting: Physician Assistant

## 2017-05-15 VITALS — BP 138/85 | HR 102 | Temp 98.8°F | Resp 16 | Ht 69.0 in | Wt 200.8 lb

## 2017-05-15 DIAGNOSIS — K219 Gastro-esophageal reflux disease without esophagitis: Secondary | ICD-10-CM | POA: Insufficient documentation

## 2017-05-15 DIAGNOSIS — L02612 Cutaneous abscess of left foot: Secondary | ICD-10-CM | POA: Insufficient documentation

## 2017-05-15 DIAGNOSIS — M7989 Other specified soft tissue disorders: Secondary | ICD-10-CM | POA: Insufficient documentation

## 2017-05-15 DIAGNOSIS — F419 Anxiety disorder, unspecified: Secondary | ICD-10-CM | POA: Insufficient documentation

## 2017-05-15 DIAGNOSIS — Z79899 Other long term (current) drug therapy: Secondary | ICD-10-CM | POA: Insufficient documentation

## 2017-05-15 DIAGNOSIS — F1110 Opioid abuse, uncomplicated: Secondary | ICD-10-CM | POA: Insufficient documentation

## 2017-05-15 DIAGNOSIS — M79672 Pain in left foot: Secondary | ICD-10-CM | POA: Insufficient documentation

## 2017-05-15 DIAGNOSIS — L03119 Cellulitis of unspecified part of limb: Secondary | ICD-10-CM

## 2017-05-15 DIAGNOSIS — L02619 Cutaneous abscess of unspecified foot: Secondary | ICD-10-CM

## 2017-05-15 DIAGNOSIS — Z794 Long term (current) use of insulin: Secondary | ICD-10-CM | POA: Insufficient documentation

## 2017-05-15 DIAGNOSIS — M109 Gout, unspecified: Secondary | ICD-10-CM | POA: Insufficient documentation

## 2017-05-15 DIAGNOSIS — L03116 Cellulitis of left lower limb: Secondary | ICD-10-CM | POA: Insufficient documentation

## 2017-05-15 DIAGNOSIS — E1165 Type 2 diabetes mellitus with hyperglycemia: Secondary | ICD-10-CM | POA: Insufficient documentation

## 2017-05-15 LAB — GLUCOSE, POCT (MANUAL RESULT ENTRY): POC GLUCOSE: 193 mg/dL — AB (ref 70–99)

## 2017-05-15 LAB — POCT GLYCOSYLATED HEMOGLOBIN (HGB A1C): HEMOGLOBIN A1C: 13.1

## 2017-05-15 MED ORDER — DOXYCYCLINE HYCLATE 100 MG PO TABS: 100.0000 mg | ORAL_TABLET | Freq: Two times a day (BID) | ORAL | 0 refills | Status: DC

## 2017-05-15 MED FILL — ?DOXYCYCLINE 100MG TABLET: 100 | 10 days supply | Qty: 20 | Fill #0

## 2017-05-15 NOTE — Progress Notes (Signed)
Patient ID: Luke [name], male   DOB: Feb 12, 1981, 37 y.o.   MRN: [id number]     Luke [name], is a 37 y.o. male  YQM:[id number]  [id number]  DOB - 12-10-1980  Subjective:  Chief Complaint and HPI: Luke [name] is a 37 y.o. male here today Pain in L foot X 1 week.  + redness and swelling.  No f/c.  He has a h/o gout but it is usu in his toes.  NKI.  He says it doesn't feel like gout.    Blood sugar is not controlled.  He doesn't like the way he feels when his blood sugar is <200.  Says blood sugars are running around 200(but A1C would say they are running higher than that).  Not taking meds as prescribed-he is taking a lower dose-says he is takin Novolin about 40 units with breakfast and 30 units with dinner.    ROS:   Constitutional:  No f/c, No night sweats, No unexplained weight loss. EENT:  No vision changes, No blurry vision, No hearing changes. No mouth, throat, or ear problems.  Respiratory: No cough, No SOB Cardiac: No CP, no palpitations GI:  No abd pain, No N/V/D. GU: No Urinary s/sx Musculoskeletal: + L foot pain Neuro: No headache, no dizziness, no motor weakness.  Skin: No rash Endocrine:  + polydipsia. No polyuria.  Psych: Denies SI/HI  No problems updated.  ALLERGIES: No Known Allergies  PAST MEDICAL HISTORY: Past Medical History:  Diagnosis Date  . Anemia   . Anxiety  Dx 2008  . Diabetes mellitus without complication ([place]) Dx 2725  . GERD (gastroesophageal reflux disease) Dx 2008  . Headache(784.0)   . Pneumonia   . Seizures ([place]) 2011    x 2 in lifetime. on Dilantin for a while.   . 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 Authorizing Provider  acetaminophen (TYLENOL) 325 MG tablet Take 2 tablets (650 mg total) by mouth every 6 (six) hours as needed for mild pain (or Fever >/= 101). 12/19/16  Yes Debbe Odea, MD  gabapentin  (NEURONTIN) 100 MG capsule Take 1 capsule (100 mg total) 3 (three) times daily by mouth. Need office visit for additional refills 01/28/17  Yes Hairston, Mandesia R, FNP  insulin aspart (NOVOLOG) 100 UNIT/ML injection Administer insulin sbq injection with meals. CBG Less than 70,  Do not give insulin. CBG 70-140 =   0 units CBG  200 or greater = Give 5 units with meals. 02/18/17  Yes Hairston, Maylon Peppers, FNP  insulin NPH-regular Human (NOVOLIN 70/30) (70-30) 100 UNIT/ML injection Inject 50 units sbq daily with breakfast. Inject 40 units sbq daily with supper. 02/18/17  Yes Hairston, Mandesia R, FNP  lisinopril (PRINIVIL,ZESTRIL) 10 MG tablet TAKE 1 TABLET BY MOUTH DAILY. 01/28/17  Yes Hairston, Maylon Peppers, FNP  Blood Glucose Monitoring Suppl (TRUE METRIX METER) DEVI 1 kit by Does not apply route as directed. Use as directed 06/07/15   Boykin Nearing, MD  collagenase (SANTYL) ointment Apply daily topically. Patient not taking: Reported on 05/15/2017 01/28/17   Alfonse Spruce, FNP  doxycycline (VIBRA-TABS) 100 MG tablet Take 1 tablet (100 mg total) by mouth 2 (two) times daily. 05/15/17   Argentina Donovan, PA-C  glucose blood test strip Use as instructed 08/20/16   Boykin Nearing, MD  Insulin Syringe-Needle U-100 (TRUEPLUS INSULIN SYRINGE) 31G X 5/16"  0.5 ML MISC USE AS DIRECTED. 01/28/17   Alfonse Spruce, FNP  ranitidine (ZANTAC) 150 MG tablet Take 150 mg by mouth 2 (two) times daily.    [provider]  SUBOXONE 8-2 MG FILM Inject 1 Film into the lesion 2 (two) times daily. 12/12/16   [provider]  TRUEPLUS LANCETS 28G MISC Use as directed 01/28/17   Alfonse Spruce, FNP     Objective:  EXAM:   Vitals:   05/15/17 1406  BP: 138/85  Pulse: (!) 102  Resp: 16  Temp: 98.8 F (37.1 C)  TempSrc: Oral  SpO2: 99%  Weight: 200 lb 12.8 oz (91.1 kg)  Height: '5\' 9"'$  (1.753 m)    General appearance : A&OX3. NAD. Non-toxic-appearing HEENT: Atraumatic and  Normocephalic.  PERRLA. EOM intact.   Neck: supple, no JVD. No cervical lymphadenopathy. No thyromegaly Chest/Lungs:  Breathing-non-labored, Good air entry bilaterally, breath sounds normal without rales, rhonchi, or wheezing  CVS: S1 S2 regular, no murmurs, gallops, rubs  Extremities: Bilateral Lower Ext shows no edema, both legs are warm to touch with = pulse throughout.  L foot with erythema over the lateral midfoot ~3X4cm.  No fluctuance or induration.  DP pulses =B.   Neurology:  CN II-XII grossly intact, Non focal.   Psych:  TP linear. J/I WNL. Normal speech. Appropriate eye contact and affect.  Skin:  No Rash  Data Review Lab Results  Component Value Date   HGBA1C 13.1 05/15/2017   HGBA1C 10.6 (H) 12/17/2016   HGBA1C 11.0 10/31/2015     Assessment & Plan   1. Cellulitis and abscess of foot - doxycycline (VIBRA-TABS) 100 MG tablet; Take 1 tablet (100 mg total) by mouth 2 (two) times daily.  Dispense: 20 tablet; Refill: 0  2. Uncontrolled type 2 diabetes mellitus with hyperglycemia ([name]) Uncontrolled.  Advised him to take meds as prescribed(he hasn't been),  increase water intake, decrease sugar intake.  Counseled him at length on poor blood sugar control, importance of compliance with diet and exercise.  Check blood sugar fasting, at lunch, and bedtime and record and see Theda Sers in 3 weeks for DM management.   - Glucose (CBG) - HgB A1c  Patient have been counseled extensively about nutrition and exercise  Return in about 3 weeks (around 06/05/2017) for appt with Theda Sers for DM then assign PCP in 2 months.  The patient was given clear instructions to go to ER or return to medical center if symptoms don't improve, worsen or new problems develop. The patient verbalized understanding. The patient was told to call to get lab results if they haven't heard anything in the next week.     Freeman Caldron, PA-C [facility],  [place]   05/15/2017, 2:38 PM

## 2017-05-15 NOTE — Patient Instructions (Addendum)
Check blood sugars fasting, at lunch, and at betime and record and bring to next visit.     Diabetes and Foot Care Diabetes may cause you to have problems because of poor blood supply (circulation) to your feet and legs. This may cause the skin on your feet to become thinner, break easier, and heal more slowly. Your skin may become dry, and the skin may peel and crack. You may also have nerve damage in your legs and feet causing decreased feeling in them. You may not notice minor injuries to your feet that could lead to infections or more serious problems. Taking care of your feet is one of the most important things you can do for yourself. Follow these instructions at home:  Wear shoes at all times, even in the house. Do not go barefoot. Bare feet are easily injured.  Check your feet daily for blisters, cuts, and redness. If you cannot see the bottom of your feet, use a mirror or ask someone for help.  Wash your feet with warm water (do not use hot water) and mild soap. Then pat your feet and the areas between your toes until they are completely dry. Do not soak your feet as this can dry your skin.  Apply a moisturizing lotion or petroleum jelly (that does not contain alcohol and is unscented) to the skin on your feet and to dry, brittle toenails. Do not apply lotion between your toes.  Trim your toenails straight across. Do not dig under them or around the cuticle. File the edges of your nails with an emery board or nail file.  Do not cut corns or calluses or try to remove them with medicine.  Wear clean socks or stockings every day. Make sure they are not too tight. Do not wear knee-high stockings since they may decrease blood flow to your legs.  Wear shoes that fit properly and have enough cushioning. To break in new shoes, wear them for just a few hours a day. This prevents you from injuring your feet. Always look in your shoes before you put them on to be sure there are no objects  inside.  Do not cross your legs. This may decrease the blood flow to your feet.  If you find a minor scrape, cut, or break in the skin on your feet, keep it and the skin around it clean and dry. These areas may be cleansed with mild soap and water. Do not cleanse the area with peroxide, alcohol, or iodine.  When you remove an adhesive bandage, be sure not to damage the skin around it.  If you have a wound, look at it several times a day to make sure it is healing.  Do not use heating pads or hot water bottles. They may burn your skin. If you have lost feeling in your feet or legs, you may not know it is happening until it is too late.  Make sure your health care provider performs a complete foot exam at least annually or more often if you have foot problems. Report any cuts, sores, or bruises to your health care provider immediately. Contact a health care provider if:  You have an injury that is not healing.  You have cuts or breaks in the skin.  You have an ingrown nail.  You notice redness on your legs or feet.  You feel burning or tingling in your legs or feet.  You have pain or cramps in your legs and feet.  Your  legs or feet are numb.  Your feet always feel cold. Get help right away if:  There is increasing redness, swelling, or pain in or around a wound.  There is a red line that goes up your leg.  Pus is coming from a wound.  You develop a fever or as directed by your health care provider.  You notice a bad smell coming from an ulcer or wound. This information is not intended to replace advice given to you by your health care provider. Make sure you discuss any questions you have with your health care provider. Document Released: 03/02/2000 Document Revised: 08/11/2015 Document Reviewed: 08/12/2012 Elsevier Interactive Patient Education  2017 [employer]  Cellulitis, Adult Cellulitis is a skin infection. The infected area is usually red and sore. This condition  occurs most often in the arms and lower legs. It is very important to get treated for this condition. Follow these instructions at home:  Take over-the-counter and prescription medicines only as told by your doctor.  If you were prescribed an antibiotic medicine, take it as told by your doctor. Do not stop taking the antibiotic even if you start to feel better.  Drink enough fluid to keep your pee (urine) clear or pale yellow.  Do not touch or rub the infected area.  Raise (elevate) the infected area above the level of your heart while you are sitting or lying down.  Place warm or cold wet cloths (warm or cold compresses) on the infected area. Do this as told by your doctor.  Keep all follow-up visits as told by your doctor. This is important. These visits let your doctor make sure your infection is not getting worse. Contact a doctor if:  You have a fever.  Your symptoms do not get better after 1-2 days of treatment.  Your bone or joint under the infected area starts to hurt after the skin has healed.  Your infection comes back. This can happen in the same area or another area.  You have a swollen bump in the infected area.  You have new symptoms.  You feel ill and also have muscle aches and pains. Get help right away if:  Your symptoms get worse.  You feel very sleepy.  You throw up (vomit) or have watery poop (diarrhea) for a long time.  There are red streaks coming from the infected area.  Your red area gets larger.  Your red area turns darker. This information is not intended to replace advice given to you by your health care provider. Make sure you discuss any questions you have with your health care provider. Document Released: 08/22/2007 Document Revised: 08/11/2015 Document Reviewed: 01/12/2015 Elsevier Interactive Patient Education  2018 [employer].

## 2017-05-15 NOTE — Progress Notes (Signed)
13Left foot pain x 1 week

## 2017-05-30 MED FILL — GABAPENTIN 100 MG CAPSULE: 100 | 30 days supply | Qty: 90 | Fill #4

## 2017-06-05 ENCOUNTER — Ambulatory Visit: Payer: Self-pay | Admitting: Pharmacist

## 2017-06-19 MED FILL — $Humulin 70/30 vial: (70-30) 100 | 33 days supply | Qty: 30 | Fill #3

## 2017-06-19 MED FILL — SANTYL OINTMENT: 250 | 15 days supply | Qty: 30 | Fill #0

## 2017-07-02 MED FILL — GABAPENTIN 100 MG CAPSULE: 100 | 30 days supply | Qty: 90 | Fill #5

## 2017-07-16 ENCOUNTER — Ambulatory Visit: Payer: Self-pay | Admitting: Nurse Practitioner

## 2017-07-21 ENCOUNTER — Encounter (HOSPITAL_COMMUNITY): Payer: Self-pay | Admitting: Emergency Medicine

## 2017-07-21 ENCOUNTER — Other Ambulatory Visit: Payer: Self-pay

## 2017-07-21 ENCOUNTER — Inpatient Hospital Stay (HOSPITAL_COMMUNITY)
Admission: EM | Admit: 2017-07-21 | Discharge: 2017-07-25 | DRG: 871 | Attending: Internal Medicine | Admitting: Internal Medicine

## 2017-07-21 DIAGNOSIS — L089 Local infection of the skin and subcutaneous tissue, unspecified: Secondary | ICD-10-CM

## 2017-07-21 DIAGNOSIS — D509 Iron deficiency anemia, unspecified: Secondary | ICD-10-CM | POA: Diagnosis present

## 2017-07-21 DIAGNOSIS — Z9114 Patient's other noncompliance with medication regimen: Secondary | ICD-10-CM

## 2017-07-21 DIAGNOSIS — F1410 Cocaine abuse, uncomplicated: Secondary | ICD-10-CM | POA: Diagnosis present

## 2017-07-21 DIAGNOSIS — Z794 Long term (current) use of insulin: Secondary | ICD-10-CM | POA: Diagnosis not present

## 2017-07-21 DIAGNOSIS — L0291 Cutaneous abscess, unspecified: Secondary | ICD-10-CM | POA: Diagnosis present

## 2017-07-21 DIAGNOSIS — L02415 Cutaneous abscess of right lower limb: Secondary | ICD-10-CM | POA: Diagnosis present

## 2017-07-21 DIAGNOSIS — D631 Anemia in chronic kidney disease: Secondary | ICD-10-CM | POA: Diagnosis present

## 2017-07-21 DIAGNOSIS — F1910 Other psychoactive substance abuse, uncomplicated: Secondary | ICD-10-CM | POA: Diagnosis present

## 2017-07-21 DIAGNOSIS — E876 Hypokalemia: Secondary | ICD-10-CM | POA: Diagnosis not present

## 2017-07-21 DIAGNOSIS — E860 Dehydration: Secondary | ICD-10-CM | POA: Diagnosis not present

## 2017-07-21 DIAGNOSIS — L02414 Cutaneous abscess of left upper limb: Secondary | ICD-10-CM | POA: Diagnosis present

## 2017-07-21 DIAGNOSIS — N179 Acute kidney failure, unspecified: Secondary | ICD-10-CM | POA: Diagnosis present

## 2017-07-21 DIAGNOSIS — I129 Hypertensive chronic kidney disease with stage 1 through stage 4 chronic kidney disease, or unspecified chronic kidney disease: Secondary | ICD-10-CM | POA: Diagnosis present

## 2017-07-21 DIAGNOSIS — E1110 Type 2 diabetes mellitus with ketoacidosis without coma: Secondary | ICD-10-CM | POA: Diagnosis present

## 2017-07-21 DIAGNOSIS — Z833 Family history of diabetes mellitus: Secondary | ICD-10-CM

## 2017-07-21 DIAGNOSIS — R1013 Epigastric pain: Secondary | ICD-10-CM

## 2017-07-21 DIAGNOSIS — Z8249 Family history of ischemic heart disease and other diseases of the circulatory system: Secondary | ICD-10-CM | POA: Diagnosis not present

## 2017-07-21 DIAGNOSIS — E1122 Type 2 diabetes mellitus with diabetic chronic kidney disease: Secondary | ICD-10-CM | POA: Diagnosis present

## 2017-07-21 DIAGNOSIS — N182 Chronic kidney disease, stage 2 (mild): Secondary | ICD-10-CM | POA: Diagnosis present

## 2017-07-21 DIAGNOSIS — L02413 Cutaneous abscess of right upper limb: Secondary | ICD-10-CM | POA: Diagnosis present

## 2017-07-21 DIAGNOSIS — L02416 Cutaneous abscess of left lower limb: Secondary | ICD-10-CM | POA: Diagnosis present

## 2017-07-21 DIAGNOSIS — F17210 Nicotine dependence, cigarettes, uncomplicated: Secondary | ICD-10-CM | POA: Diagnosis present

## 2017-07-21 DIAGNOSIS — K219 Gastro-esophageal reflux disease without esophagitis: Secondary | ICD-10-CM | POA: Diagnosis present

## 2017-07-21 DIAGNOSIS — E1010 Type 1 diabetes mellitus with ketoacidosis without coma: Secondary | ICD-10-CM

## 2017-07-21 DIAGNOSIS — R739 Hyperglycemia, unspecified: Secondary | ICD-10-CM

## 2017-07-21 DIAGNOSIS — A419 Sepsis, unspecified organism: Secondary | ICD-10-CM | POA: Diagnosis not present

## 2017-07-21 DIAGNOSIS — Z720 Tobacco use: Secondary | ICD-10-CM | POA: Diagnosis present

## 2017-07-21 DIAGNOSIS — L02419 Cutaneous abscess of limb, unspecified: Secondary | ICD-10-CM

## 2017-07-21 DIAGNOSIS — R109 Unspecified abdominal pain: Secondary | ICD-10-CM | POA: Diagnosis present

## 2017-07-21 DIAGNOSIS — I10 Essential (primary) hypertension: Secondary | ICD-10-CM

## 2017-07-21 DIAGNOSIS — E1011 Type 1 diabetes mellitus with ketoacidosis with coma: Secondary | ICD-10-CM

## 2017-07-21 HISTORY — DX: Type 2 diabetes mellitus with ketoacidosis without coma: E11.10

## 2017-07-21 LAB — CBC
HEMATOCRIT: 35.7 % — AB (ref 39.0–52.0)
HEMOGLOBIN: 11.9 g/dL — AB (ref 13.0–17.0)
MCH: 25.9 pg — AB (ref 26.0–34.0)
MCHC: 33.3 g/dL (ref 30.0–36.0)
MCV: 77.8 fL — AB (ref 78.0–100.0)
Platelets: 588 10*3/uL — ABNORMAL HIGH (ref 150–400)
RBC: 4.59 MIL/uL (ref 4.22–5.81)
RDW: 14.5 % (ref 11.5–15.5)
WBC: 23.2 10*3/uL — ABNORMAL HIGH (ref 4.0–10.5)

## 2017-07-21 LAB — I-STAT VENOUS BLOOD GAS, ED
ACID-BASE DEFICIT: 5 mmol/L — AB (ref 0.0–2.0)
Bicarbonate: 17.7 mmol/L — ABNORMAL LOW (ref 20.0–28.0)
O2 Saturation: 82 %
PH VEN: 7.429 (ref 7.250–7.430)
TCO2: 19 mmol/L — ABNORMAL LOW (ref 22–32)
pCO2, Ven: 26.8 mmHg — ABNORMAL LOW (ref 44.0–60.0)
pO2, Ven: 44 mmHg (ref 32.0–45.0)

## 2017-07-21 LAB — CBG MONITORING, ED
GLUCOSE-CAPILLARY: 570 mg/dL — AB (ref 65–99)
GLUCOSE-CAPILLARY: 544 mg/dL — AB (ref 65–99)
GLUCOSE-CAPILLARY: 440 mg/dL — AB (ref 65–99)
Glucose-Capillary: 327 mg/dL — ABNORMAL HIGH (ref 65–99)
Glucose-Capillary: 355 mg/dL — ABNORMAL HIGH (ref 65–99)
Glucose-Capillary: 536 mg/dL (ref 65–99)

## 2017-07-21 LAB — HEPATIC FUNCTION PANEL
ALT: 18 U/L (ref 17–63)
AST: 18 U/L (ref 15–41)
Albumin: 3 g/dL — ABNORMAL LOW (ref 3.5–5.0)
Alkaline Phosphatase: 145 U/L — ABNORMAL HIGH (ref 38–126)
BILIRUBIN INDIRECT: 1.5 mg/dL — AB (ref 0.3–0.9)
Bilirubin, Direct: 0.2 mg/dL (ref 0.1–0.5)
Total Bilirubin: 1.7 mg/dL — ABNORMAL HIGH (ref 0.3–1.2)
Total Protein: 7.1 g/dL (ref 6.5–8.1)

## 2017-07-21 LAB — URINALYSIS, ROUTINE W REFLEX MICROSCOPIC
BACTERIA UA: NONE SEEN
BILIRUBIN URINE: NEGATIVE
Glucose, UA: >=500 mg/dL — AB
Ketones, ur: 80 mg/dL — AB
Leukocytes, UA: NEGATIVE
NITRITE: NEGATIVE
Protein, ur: >=300 mg/dL — AB
Specific Gravity, Urine: 1.028 (ref 1.005–1.030)
pH: 5 (ref 5.0–8.0)

## 2017-07-21 LAB — PROCALCITONIN: Procalcitonin: 0.31 ng/mL

## 2017-07-21 LAB — BASIC METABOLIC PANEL
ANION GAP: 26 — AB (ref 5–15)
Anion gap: 27 — ABNORMAL HIGH (ref 5–15)
BUN: 28 mg/dL — AB (ref 6–20)
BUN: 31 mg/dL — AB (ref 6–20)
CALCIUM: 8.8 mg/dL — AB (ref 8.9–10.3)
CHLORIDE: 87 mmol/L — AB (ref 101–111)
CO2: 17 mmol/L — AB (ref 22–32)
CO2: 15 mmol/L — AB (ref 22–32)
CREATININE: 2.08 mg/dL — AB (ref 0.61–1.24)
Calcium: 9.2 mg/dL (ref 8.9–10.3)
Chloride: 93 mmol/L — ABNORMAL LOW (ref 101–111)
Creatinine, Ser: 1.87 mg/dL — ABNORMAL HIGH (ref 0.61–1.24)
GFR calc Af Amer: 51 mL/min — ABNORMAL LOW (ref >60)
GFR calc Af Amer: 45 mL/min — ABNORMAL LOW (ref >60)
GFR calc non Af Amer: 44 mL/min — ABNORMAL LOW (ref >60)
GFR calc non Af Amer: 39 mL/min — ABNORMAL LOW (ref >60)
GLUCOSE: 590 mg/dL — AB (ref 65–99)
GLUCOSE: 504 mg/dL — AB (ref 65–99)
POTASSIUM: 4.5 mmol/L (ref 3.5–5.1)
Potassium: 3.8 mmol/L (ref 3.5–5.1)
SODIUM: 131 mmol/L — AB (ref 135–145)
SODIUM: 134 mmol/L — AB (ref 135–145)

## 2017-07-21 LAB — RAPID URINE DRUG SCREEN, HOSP PERFORMED
AMPHETAMINES: NOT DETECTED
BENZODIAZEPINES: NOT DETECTED
Barbiturates: NOT DETECTED
Cocaine: NOT DETECTED
Opiates: NOT DETECTED
Tetrahydrocannabinol: NOT DETECTED

## 2017-07-21 LAB — LIPASE, BLOOD: Lipase: 34 U/L (ref 11–51)

## 2017-07-21 LAB — SODIUM, URINE, RANDOM: SODIUM UR: 72 mmol/L

## 2017-07-21 LAB — CREATININE, URINE, RANDOM: Creatinine, Urine: 33.43 mg/dL

## 2017-07-21 LAB — LACTIC ACID, PLASMA: LACTIC ACID, VENOUS: 2.3 mmol/L — AB (ref 0.5–1.9)

## 2017-07-21 MED ORDER — OXYCODONE HCL 5 MG PO TABS
10.0000 mg | ORAL_TABLET | Freq: Four times a day (QID) | ORAL | Status: DC | PRN
  Administered 2017-07-22 (×2): 10 mg via ORAL
  Filled 2017-07-21 (×2): qty 2

## 2017-07-21 MED ORDER — SODIUM CHLORIDE 0.9 % IV BOLUS
1000.0000 mL | Freq: Once | INTRAVENOUS | Status: AC
  Administered 2017-07-21: 1000 mL via INTRAVENOUS

## 2017-07-21 MED ORDER — ZOLPIDEM TARTRATE 5 MG PO TABS: 5.0000 mg | ORAL_TABLET | Freq: Every evening | ORAL | Status: DC | PRN

## 2017-07-21 MED ORDER — ACETAMINOPHEN 325 MG PO TABS
650.0000 mg | ORAL_TABLET | Freq: Four times a day (QID) | ORAL | Status: DC | PRN
  Administered 2017-07-22: 650 mg via ORAL
  Filled 2017-07-21: qty 2

## 2017-07-21 MED ORDER — SODIUM CHLORIDE 0.9 % IV SOLN
1750.0000 mg | Freq: Once | INTRAVENOUS | Status: AC
  Administered 2017-07-21: 1750 mg via INTRAVENOUS
  Filled 2017-07-21: qty 1750

## 2017-07-21 MED ORDER — FAMOTIDINE 20 MG PO TABS
20.0000 mg | ORAL_TABLET | Freq: Every day | ORAL | Status: DC
  Administered 2017-07-21 – 2017-07-22 (×2): 20 mg via ORAL
  Filled 2017-07-21 (×2): qty 1

## 2017-07-21 MED ORDER — DEXTROSE-NACL 5-0.45 % IV SOLN
INTRAVENOUS | Status: DC
  Administered 2017-07-22 (×2): via INTRAVENOUS

## 2017-07-21 MED ORDER — GABAPENTIN 100 MG PO CAPS
100.0000 mg | ORAL_CAPSULE | Freq: Three times a day (TID) | ORAL | Status: DC
  Administered 2017-07-21 – 2017-07-25 (×11): 100 mg via ORAL
  Filled 2017-07-21 (×11): qty 1

## 2017-07-21 MED ORDER — IOPAMIDOL (ISOVUE-300) INJECTION 61%
INTRAVENOUS | Status: AC
  Filled 2017-07-21: qty 30

## 2017-07-21 MED ORDER — AMOXICILLIN-POT CLAVULANATE 875-125 MG PO TABS
1.0000 | ORAL_TABLET | Freq: Once | ORAL | Status: AC
  Administered 2017-07-21: 1 via ORAL
  Filled 2017-07-21: qty 1

## 2017-07-21 MED ORDER — NICOTINE 21 MG/24HR TD PT24
21.0000 mg | MEDICATED_PATCH | Freq: Every day | TRANSDERMAL | Status: DC
  Administered 2017-07-21 – 2017-07-25 (×5): 21 mg via TRANSDERMAL
  Filled 2017-07-21 (×5): qty 1

## 2017-07-21 MED ORDER — SODIUM CHLORIDE 0.9 % IV SOLN
INTRAVENOUS | Status: DC
  Administered 2017-07-21: via INTRAVENOUS

## 2017-07-21 MED ORDER — SODIUM CHLORIDE 0.9 % IV SOLN
INTRAVENOUS | Status: AC
  Administered 2017-07-21: 4.8 [IU]/h via INTRAVENOUS
  Administered 2017-07-22: 6.8 [IU]/h via INTRAVENOUS
  Filled 2017-07-21 (×3): qty 1

## 2017-07-21 MED ORDER — POTASSIUM CHLORIDE 10 MEQ/100ML IV SOLN
10.0000 meq | INTRAVENOUS | Status: AC
  Administered 2017-07-21 (×2): 10 meq via INTRAVENOUS
  Filled 2017-07-21 (×2): qty 100

## 2017-07-21 MED ORDER — ONDANSETRON 4 MG PO TBDP
8.0000 mg | ORAL_TABLET | Freq: Once | ORAL | Status: AC
  Administered 2017-07-21: 8 mg via ORAL
  Filled 2017-07-21: qty 2

## 2017-07-21 MED ORDER — SODIUM CHLORIDE 0.9 % IV SOLN: INTRAVENOUS | Status: DC

## 2017-07-21 MED ORDER — ONDANSETRON HCL 4 MG/2ML IJ SOLN
4.0000 mg | Freq: Once | INTRAMUSCULAR | Status: AC
  Administered 2017-07-21: 4 mg via INTRAVENOUS
  Filled 2017-07-21: qty 2

## 2017-07-21 MED ORDER — SODIUM CHLORIDE 0.9 % IV BOLUS
2000.0000 mL | Freq: Once | INTRAVENOUS | Status: AC
  Administered 2017-07-21: 2000 mL via INTRAVENOUS

## 2017-07-21 MED ORDER — AMLODIPINE BESYLATE 5 MG PO TABS
5.0000 mg | ORAL_TABLET | Freq: Every day | ORAL | Status: DC
Start: 2017-07-21 — End: 2017-07-23
  Administered 2017-07-21 – 2017-07-22 (×2): 5 mg via ORAL
  Filled 2017-07-21 (×2): qty 1

## 2017-07-21 MED ORDER — HYDRALAZINE HCL 20 MG/ML IJ SOLN: 5.0000 mg | INTRAMUSCULAR | Status: DC | PRN

## 2017-07-21 MED ORDER — ENOXAPARIN SODIUM 40 MG/0.4ML ~~LOC~~ SOLN
40.0000 mg | SUBCUTANEOUS | Status: DC
  Administered 2017-07-21: 40 mg via SUBCUTANEOUS
  Filled 2017-07-21 (×4): qty 0.4

## 2017-07-21 MED ORDER — VANCOMYCIN HCL IN DEXTROSE 750-5 MG/150ML-% IV SOLN
750.0000 mg | Freq: Two times a day (BID) | INTRAVENOUS | Status: DC
  Filled 2017-07-21 (×2): qty 150

## 2017-07-21 MED ORDER — ONDANSETRON HCL 4 MG/2ML IJ SOLN
4.0000 mg | Freq: Three times a day (TID) | INTRAMUSCULAR | Status: DC | PRN
  Administered 2017-07-21: 4 mg via INTRAVENOUS
  Filled 2017-07-21: qty 2

## 2017-07-21 MED ORDER — DEXTROSE-NACL 5-0.45 % IV SOLN
INTRAVENOUS | Status: AC
Start: 2017-07-21 — End: 2017-07-22

## 2017-07-21 NOTE — ED Notes (Signed)
IV team at bedside attempting for second PIV.

## 2017-07-21 NOTE — ED Triage Notes (Signed)
Last 26 U Regular Insulin 900

## 2017-07-21 NOTE — ED Notes (Signed)
IV team at bedside 

## 2017-07-21 NOTE — ED Triage Notes (Signed)
Pt. Stated, It started going up yesterday. Today N/V. Pt in the custody [facility] sheriff's dept.

## 2017-07-21 NOTE — ED Provider Notes (Signed)
[facility] Provider Note   CSN: [id number] Arrival date & time: 07/21/17  1013     History   Chief Complaint Chief Complaint  Patient presents with  . Hyperglycemia  . Emesis  . Back Pain    HPI Luke [name] is a 37 y.o. male.  He presents for evaluation of nausea, vomiting, hyperglycemia, elevated ketones and blood in his urine.  He has been in jail for 3 days.  Prior to being incarcerated he was taking 70/30 insulin twice a day and regular insulin sliding scale.  For the last 3 days, in jail he has been treated only with regular insulin.  Patient has a history of heroin abuse.  He has a history of medication noncompliance.  He complains of abdominal pain with multiple episodes of vomiting today.  He denies fever or chills.  There are no other known modifying factors.  HPI  Past Medical History:  Diagnosis Date  . Anemia   . Anxiety  Dx 2008  . Diabetes mellitus without complication ([place]) Dx 2706  . GERD (gastroesophageal reflux disease) Dx 2008  . Headache(784.0)   . Pneumonia   . Seizures ([name]) 2011    x 2 in lifetime. on Dilantin for a while.   . Substance abuse ([place]) 2013    heroin use, multiple relapses    Patient Active Problem List   Diagnosis Date Noted  . Type II diabetes mellitus with renal manifestations ([place]) 07/21/2017  . Acute renal failure superimposed on stage 2 chronic kidney disease ([place]) 07/21/2017  . GERD (gastroesophageal reflux disease) 07/21/2017  . Abdominal pain 07/21/2017  . Diabetic polyneuropathy associated with type 2 diabetes mellitus ([place])   . Prostatitis, acute   . MRSA infection   . Noncompliant bladder   . Urinary retention   . Prostate abscess   . Hyperglycemia 12/16/2016  . Sepsis ([place]) 12/16/2016  . Tachycardia 12/16/2016  . Fever 12/16/2016  . ARF (acute renal failure) ([place]) 12/16/2016  . Anemia 12/16/2016  . Cocaine abuse ([place]) 08/16/2015  . Type 2 diabetes mellitus with  hyperglycemia, with long-term current use of insulin ([place]) 08/16/2015  . Cellulitis 12/03/2014  . Hyponatremia 12/03/2014  . AKI (acute kidney injury) ([place]) 12/03/2014  . Left leg pain 12/03/2014  . Elevated liver enzymes 07/26/2014  . Dental caries 07/23/2014  . Tinea pedis 07/23/2014  . Scars 07/23/2014  . DKA (diabetic ketoacidoses) ([place]) 11/23/2013  . Skin abscess 10/14/2012  . Leukocytosis 10/14/2012  . 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 Tobacco abuse 10/11/2012  . DM type 2, uncontrolled, with renal complications ([place]) 23/76/2831  . HTN (hypertension) 10/11/2012    Past Surgical History:  Procedure Laterality Date  . I&D EXTREMITY Left 10/11/2012   Procedure: IRRIGATION AND DEBRIDEMENT ABSCESS FOREARM;  Surgeon: Linna Hoff, MD;  Location: [facility];  Service: Orthopedics;  Laterality: Left;  . I&D EXTREMITY Left 10/12/2012   Procedure: IRRIGATION AND DEBRIDEMENT FOREARM;  Surgeon: Linna Hoff, MD;  Location: [facility];  Service: Orthopedics;  Laterality: Left;  . I&D EXTREMITY Left 10/14/2012   Procedure: incision and drainage left forearm;  Surgeon: Linna Hoff, MD;  Location: [facility];  Service: Orthopedics;  Laterality: Left;  . I&D EXTREMITY Left 10/16/2012   Procedure: IRRIGATION AND DEBRIDEMENT LEFT FOREARM;  Surgeon: Linna Hoff, MD;  Location: [facility];  Service: Orthopedics;  Laterality: Left;  . I&D EXTREMITY Left 10/20/2012   Procedure: INCISION AND DRAINAGE AND DEBRIDEMENT LEFT  FOREARM;  Surgeon: Linna Hoff, MD;  Location: [facility];  Service: Orthopedics;  Laterality: Left;  . IRRIGATION AND DEBRIDEMENT ABSCESS     Hx: of left arm abscess related to drug use         Home Medications    Prior to Admission medications   Medication Sig Start Date End Date Taking? Authorizing Provider  acetaminophen (TYLENOL) 325 MG tablet Take 2 tablets (650 mg total) by mouth every 6 (six) hours as needed for mild pain (or Fever >/= 101). 12/19/16   Debbe Odea, MD  Blood Glucose  Monitoring Suppl (TRUE METRIX METER) DEVI 1 kit by Does not apply route as directed. Use as directed 06/07/15   Boykin Nearing, MD  collagenase (SANTYL) ointment Apply daily topically. Patient not taking: Reported on 05/15/2017 01/28/17   Alfonse Spruce, FNP  doxycycline (VIBRA-TABS) 100 MG tablet Take 1 tablet (100 mg total) by mouth 2 (two) times daily. 05/15/17   Argentina Donovan, PA-C  gabapentin (NEURONTIN) 100 MG capsule Take 1 capsule (100 mg total) 3 (three) times daily by mouth. Need office visit for additional refills 01/28/17   Fredia Beets R, FNP  glucose blood test strip Use as instructed 08/20/16   Boykin Nearing, MD  insulin aspart (NOVOLOG) 100 UNIT/ML injection Administer insulin sbq injection with meals. CBG Less than 70,  Do not give insulin. CBG 70-140 =   0 units CBG  200 or greater = Give 5 units with meals. 02/18/17   Alfonse Spruce, FNP  insulin NPH-regular Human (NOVOLIN 70/30) (70-30) 100 UNIT/ML injection Inject 50 units sbq daily with breakfast. Inject 40 units sbq daily with supper. 02/18/17   Alfonse Spruce, FNP  Insulin Syringe-Needle U-100 (TRUEPLUS INSULIN SYRINGE) 31G X 5/16" 0.5 ML MISC USE AS DIRECTED. 01/28/17   Hairston, Toy Baker R, FNP  lisinopril (PRINIVIL,ZESTRIL) 10 MG tablet TAKE 1 TABLET BY MOUTH DAILY. 01/28/17   Alfonse Spruce, FNP  ranitidine (ZANTAC) 150 MG tablet Take 150 mg by mouth 2 (two) times daily.    [provider]  SUBOXONE 8-2 MG FILM Inject 1 Film into the lesion 2 (two) times daily. 12/12/16   [provider]  TRUEPLUS LANCETS 28G MISC Use as directed 01/28/17   Alfonse Spruce, FNP    Family History Family History  Problem Relation Age of Onset  . Diabetes Father   . Heart disease Father   . Mental illness Sister   . Cancer Neg Hx     Social History Social History   Tobacco Use  . Smoking status: Current Every Day Smoker    Packs/day: 1.00    Types: Cigarettes  . Smokeless  tobacco: Never Used  Substance Use Topics  . Alcohol use: No    Alcohol/week: 0.0 oz    Comment: occasional  . Drug use: Yes    Types: Cocaine, IV, Heroin    Comment: last use Cocaine used 08/14/2015. IV last used Feb 2017     Allergies   Patient has no known allergies.   Review of Systems Review of Systems  All other systems reviewed and are negative.    Physical Exam Updated Vital Signs BP (!) 142/67   Pulse (!) 102   Temp 98.7 F (37.1 C) (Oral)   Resp (!) 25   Ht '5\' 9"'$  (1.753 m)   Wt 86.2 kg (190 lb)   SpO2 100%   BMI 28.06 kg/m   Physical Exam  Constitutional: He is oriented to  person, place, and time. He appears well-developed and well-nourished. He appears distressed (He is uncomfortable).  HENT:  Head: Normocephalic and atraumatic.  Right Ear: External ear normal.  Left Ear: External ear normal.  Mouth/Throat: Oropharynx is clear and moist.  Eyes: Pupils are equal, round, and reactive to light. Conjunctivae and EOM are normal.  Neck: Normal range of motion and phonation normal. Neck supple.  Cardiovascular: Normal rate, regular rhythm and normal heart sounds.  Pulmonary/Chest: Effort normal and breath sounds normal. No respiratory distress. He exhibits no bony tenderness.  Abdominal: Soft. There is tenderness (Epigastric, mild).  Musculoskeletal: Normal range of motion. He exhibits no deformity.  Neurological: He is alert and oriented to person, place, and time. No cranial nerve deficit or sensory deficit. He exhibits normal muscle tone. Coordination normal.  Skin: Skin is warm, dry and intact.  Skin of both arms with chronic changes over accessible vein areas consistent with prior heroin injection sites.  Scattered pustules both arms upper and lower, with concentration at the right proximal forearm region.  No areas of frank fluctuance or cellulitis.  Psychiatric: He has a normal mood and affect. His behavior is normal. Judgment and thought content normal.    Nursing note and vitals reviewed.    ED Treatments / Results  Labs (all labs ordered are listed, but only abnormal results are displayed) Labs Reviewed  BASIC METABOLIC PANEL - Abnormal; Notable for the following components:      Result Value   Sodium 131 (*)    Chloride 87 (*)    CO2 17 (*)    Glucose, Bld 590 (*)    BUN 28 (*)    Creatinine, Ser 1.87 (*)    GFR calc non Af Amer 44 (*)    GFR calc Af Amer 51 (*)    Anion gap 27 (*)    All other components within normal limits  CBC - Abnormal; Notable for the following components:   WBC 23.2 (*)    Hemoglobin 11.9 (*)    HCT 35.7 (*)    MCV 77.8 (*)    MCH 25.9 (*)    Platelets 588 (*)    All other components within normal limits  URINALYSIS, ROUTINE W REFLEX MICROSCOPIC - Abnormal; Notable for the following components:   Glucose, UA >=500 (*)    Hgb urine dipstick MODERATE (*)    Ketones, ur 80 (*)    Protein, ur >=300 (*)    All other components within normal limits  CBG MONITORING, ED - Abnormal; Notable for the following components:   Glucose-Capillary 327 (*)    All other components within normal limits  CBG MONITORING, ED - Abnormal; Notable for the following components:   Glucose-Capillary 570 (*)    All other components within normal limits  I-STAT VENOUS BLOOD GAS, ED - Abnormal; Notable for the following components:   pCO2, Ven 26.8 (*)    Bicarbonate 17.7 (*)    TCO2 19 (*)    Acid-base deficit 5.0 (*)    All other components within normal limits  CBG MONITORING, ED - Abnormal; Notable for the following components:   Glucose-Capillary 544 (*)    All other components within normal limits  CBG MONITORING, ED - Abnormal; Notable for the following components:   Glucose-Capillary 536 (*)    All other components within normal limits  CULTURE, BLOOD (ROUTINE X 2)  CULTURE, BLOOD (ROUTINE X 2)  LACTIC ACID, PLASMA  LACTIC ACID, PLASMA  PROCALCITONIN  LIPASE, BLOOD  CREATININE, URINE, RANDOM  SODIUM,  URINE, RANDOM  HEPATIC FUNCTION PANEL  RAPID URINE DRUG SCREEN, HOSP PERFORMED  HIV ANTIBODY (ROUTINE TESTING)  BASIC METABOLIC PANEL  BASIC METABOLIC PANEL  BASIC METABOLIC PANEL  BASIC METABOLIC PANEL    EKG None  Radiology No results found.  Procedures .Central Line Date/Time: 07/21/2017 4:44 PM Performed by: Daleen Bo, MD Authorized by: Daleen Bo, MD   Consent:    Consent obtained:  Verbal   Consent given by:  Patient   Risks discussed:  Incorrect placement, bleeding and infection   Alternatives discussed:  No treatment Pre-procedure details:    Hand hygiene: Hand hygiene performed prior to insertion     Sterile barrier technique: All elements of maximal sterile technique followed     Skin preparation:  2% chlorhexidine   Skin preparation agent: Skin preparation agent completely dried prior to procedure   Anesthesia (see MAR for exact dosages):    Anesthesia method:  Local infiltration   Local anesthetic:  Lidocaine 1% w/o epi Procedure details:    Location:  R femoral   Patient position:  Flat   Landmarks identified: yes     Ultrasound guidance: yes     Number of attempts:  1   Successful placement: no   Post-procedure details:    Post-procedure:  Dressing applied .Critical Care Performed by: Daleen Bo, MD Authorized by: Daleen Bo, MD   Critical care provider statement:    Critical care time (minutes):  65   Critical care start time:  07/21/2017 12:15 PM   Critical care end time:  07/21/2017 8:04 PM   Critical care time was exclusive of:  Separately billable procedures and treating other patients   Critical care was necessary to treat or prevent imminent or life-threatening deterioration of the following conditions:  Metabolic crisis   Critical care was time spent personally by me on the following activities:  Blood draw for specimens, development of treatment plan with patient or surrogate, discussions with consultants, evaluation of patient's  response to treatment, examination of patient, obtaining history from patient or surrogate, ordering and performing treatments and interventions, ordering and review of laboratory studies, pulse oximetry, re-evaluation of patient's condition, review of old charts and ordering and review of radiographic studies   (including critical care time)  Medications Ordered in ED Medications  dextrose 5 %-0.45 % sodium chloride infusion ( Intravenous Not Given 07/21/17 2139)  insulin regular (NOVOLIN R,HUMULIN R) 100 Units in sodium chloride 0.9 % 100 mL (1 Units/mL) infusion (9.5 Units/hr Intravenous Rate/Dose Change 07/21/17 2111)  ondansetron (ZOFRAN) injection 4 mg (4 mg Intravenous Given 07/21/17 2200)  sodium chloride 0.9 % bolus 2,000 mL (2,000 mLs Intravenous New Bag/Given 07/21/17 2136)  acetaminophen (TYLENOL) tablet 650 mg (has no administration in time range)  gabapentin (NEURONTIN) capsule 100 mg (has no administration in time range)  famotidine (PEPCID) tablet 20 mg (has no administration in time range)  oxyCODONE (Oxy IR/ROXICODONE) immediate release tablet 10 mg (has no administration in time range)  nicotine (NICODERM CQ - dosed in mg/24 hours) patch 21 mg (has no administration in time range)  hydrALAZINE (APRESOLINE) injection 5 mg (has no administration in time range)  zolpidem (AMBIEN) tablet 5 mg (has no administration in time range)  0.9 %  sodium chloride infusion (has no administration in time range)  dextrose 5 %-0.45 % sodium chloride infusion (has no administration in time range)  enoxaparin (LOVENOX) injection 40 mg (has no administration in time range)  potassium chloride  10 mEq in 100 mL IVPB (has no administration in time range)  amLODipine (NORVASC) tablet 5 mg (has no administration in time range)  iopamidol (ISOVUE-300) 61 % injection (has no administration in time range)  vancomycin (VANCOCIN) 1,750 mg in sodium chloride 0.9 % 500 mL IVPB (has no administration in time range)    vancomycin (VANCOCIN) IVPB 750 mg/150 ml premix (has no administration in time range)  sodium chloride 0.9 % bolus 1,000 mL (0 mLs Intravenous Stopped 07/21/17 1906)  ondansetron (ZOFRAN-ODT) disintegrating tablet 8 mg (8 mg Oral Given 07/21/17 1659)  sodium chloride 0.9 % bolus 1,000 mL (0 mLs Intravenous Stopped 07/21/17 2005)  amoxicillin-clavulanate (AUGMENTIN) 875-125 MG per tablet 1 tablet (1 tablet Oral Given 07/21/17 1903)  ondansetron (ZOFRAN) injection 4 mg (4 mg Intravenous Given 07/21/17 1904)     Initial Impression / Assessment and Plan / ED Course  I have reviewed the triage vital signs and the nursing notes.  Pertinent labs & imaging results that were available during my care of the patient were reviewed by me and considered in my medical decision making (see chart for details).  Clinical Course as of Jul 22 2202  Nancy Fetter Jul 21, 2017  1525 RN team unable to obtain IV access.  I attempted left EJ access with an 18-gauge long needle, unable to obtain access to the left external jugular vein.  Efforts discontinued will attempt central line.   [EW]  1610 Patient had severe pain, and resisted efforts, with attempt to access right femoral vein, therefore efforts were discontinued.   [EW]  [id number] Patient does not appear toxic at this time.  He is alert and cooperative.  He has vomited a couple of times since he has been here.   [EW]  1958 Somewhat improved after initial IV fluids  POC CBG, ED(!!) [EW]  1959 Abnormal, elevated glucose, hemoglobin, ketones, protein, RBCs and WBCs  Urinalysis, Routine w reflex microscopic(!) [EW]  1959 Venous gas: Normal pH, low PCO2, low bicarb  I-Stat venous blood gas, ED(!) [EW]  1959 Normal except low sodium, low chloride, low CO2, elevated glucose, elevated BUN, elevated creatinine  Basic metabolic panel(!!) [EW]  9604 Elevated WBC, and low hemoglobin and low MCV  CBC(!) [EW]    Clinical Course User Index [EW] Daleen Bo, MD     Patient Vitals  for the past 24 hrs:  BP Temp Temp src Pulse Resp SpO2 Height Weight  07/21/17 2030 (!) 142/67 - - (!) 102 (!) 25 100 % - -  07/21/17 2015 140/75 - - 99 (!) 23 100 % - -  07/21/17 1900 (!) 138/56 - - 95 18 100 % - -  07/21/17 1815 (!) 154/79 - - 99 18 100 % - -  07/21/17 1715 (!) 144/85 - - 99 - 100 % - -  07/21/17 1702 119/73 98.7 F (37.1 C) Oral 94 16 100 % - -  07/21/17 1021 139/85 99.1 F (37.3 C) Oral 98 17 - '5\' 9"'$  (1.753 m) 86.2 kg (190 lb)    8:07 PM Reevaluation with update and discussion. After initial assessment and treatment, an updated evaluation reveals he continues to have intermittent vomiting.  Patient updated on findings and plan.  Patient with law enforcement who will stay with him in the hospital.. [place] decision making-hyperglycemia, despite treatment with regular insulin, possibly complicated by skin infection.  Venous pH normal, anion gap moderately elevated.  Patient is hemodynamically stable.  Doubt severe sepsis likely dehydration  with elevated creatinine and BUN.  Nausea and vomiting with renal insufficiency, may be primary or secondary.  Patient will require admission for monitoring, further treatment and stabilization.   Nursing Notes Reviewed/ Care Coordinated Applicable Imaging Reviewed Interpretation of Laboratory Data incorporated into ED treatment   8:07 PM-Consult complete with hospitalist. Patient case explained and discussed.  He agrees to admit patient for further evaluation and treatment. Call ended at 8:25 PM  Plan: Admit     Final Clinical Impressions(s) / ED Diagnoses   Final diagnoses:  Hyperglycemia  Skin infection  Dehydration  Diabetic ketoacidosis without coma associated with type 1 diabetes mellitus [facility])    ED Discharge Orders    None       Daleen Bo, MD 07/21/17 2206

## 2017-07-21 NOTE — ED Notes (Signed)
Dr. Eulis Foster attempted for EJ without success

## 2017-07-21 NOTE — ED Notes (Signed)
EDP Luke [name] attempted to place central line; pt in to much discomfort to proceed, placement terminated at this time.

## 2017-07-21 NOTE — ED Notes (Signed)
Darrel, EMT/tech attempting to draw labs at this time.

## 2017-07-21 NOTE — H&P (Signed)
History and Physical    Luke [name] GGE:[id number] DOB: 15-Nov-1980 DOA: 07/21/2017  Referring MD/NP/PA:   PCP: Patient, No Pcp Per   Patient coming from:  The patient is coming from [place].  At baseline, pt is independent for most of ADL.   Chief Complaint: Nausea, vomiting, abdominal pain, skin infection  HPI: Luke [name] is a 37 y.o. male with medical history significant of hypertension, diabetes mellitus, GERD, anemia, polysubstance abuse (tobacco, cocaine, IV drug user), who presents with nausea, vomiting, abdominal pain, skin infection.  Pt is in the custody [facility] sheriff's dept. Patient states that he has been having nausea vomiting, abdominal pain today.  He vomited more than 10 times, without blood in the vomitus.  He denies diarrhea.  No fever or chills.  His abdominal pain is located in the upper abdomen, constant, 8 out of 10 severity, radiating to the middle back.  Patient denies chest pain, shortness breath, cough.  No symptoms of UTI.  No urinary incontinence.  No leg numbness or weakness. Of note, prior to being incarcerated he was taking 70/30 insulin twice a day and regular insulin sliding scale.  For the last 3 days, in jail he has been treated only with regular insulin. Pt as multiple skin infection with skin boils.   ED Course: pt was found to have DKA with blood sugar 590, bicarbonate 17 and anion gap 27, WBC 23.2, negative urinalysis, worsening renal function, temperature normal, heart rate in 90s, no tachypnea, oxygen saturation 100% on room air.  Patient is admitted to stepdown as inpatient.  Review of Systems:   General: no fevers, chills, no body weight gain, has fatigue HEENT: no blurry vision, hearing changes or sore throat Respiratory: no dyspnea, coughing, wheezing CV: no chest pain, no palpitations GI: has nausea, vomiting, abdominal pain, no diarrhea, constipation GU: no dysuria, burning on urination, increased urinary frequency, hematuria    Ext: no leg edema Neuro: no unilateral weakness, numbness, or tingling, no vision change or hearing loss Skin: has multiple skin boils and scars MSK: No muscle spasm, no deformity, no limitation of range of movement in spin. Has middle back pain. Heme: No easy bruising.  Travel history: No recent long distant travel.  Allergy: No Known Allergies  Past Medical History:  Diagnosis Date  . Anemia   . Anxiety  Dx 2008  . Diabetes mellitus without complication ([place]) Dx 4650  . GERD (gastroesophageal reflux disease) Dx 2008  . Headache(784.0)   . Pneumonia   . Seizures ([place]) 2011    x 2 in lifetime. on Dilantin for a while.   . Substance abuse ([place]) 2013    heroin use, multiple relapses    Past Surgical History:  Procedure Laterality Date  . I&D EXTREMITY Left 10/11/2012   Procedure: IRRIGATION AND DEBRIDEMENT ABSCESS FOREARM;  Surgeon: Linna Hoff, MD;  Location: [place];  Service: Orthopedics;  Laterality: Left;  . I&D EXTREMITY Left 10/12/2012   Procedure: IRRIGATION AND DEBRIDEMENT FOREARM;  Surgeon: Linna Hoff, MD;  Location: [place];  Service: Orthopedics;  Laterality: Left;  . I&D EXTREMITY Left 10/14/2012   Procedure: incision and drainage left forearm;  Surgeon: Linna Hoff, MD;  Location: [place];  Service: Orthopedics;  Laterality: Left;  . I&D EXTREMITY Left 10/16/2012   Procedure: IRRIGATION AND DEBRIDEMENT LEFT FOREARM;  Surgeon: Linna Hoff, MD;  Location: [place];  Service: Orthopedics;  Laterality: Left;  . I&D EXTREMITY Left 10/20/2012   Procedure: INCISION  AND DRAINAGE AND DEBRIDEMENT LEFT  FOREARM;  Surgeon: Linna Hoff, MD;  Location: [place];  Service: Orthopedics;  Laterality: Left;  . IRRIGATION AND DEBRIDEMENT ABSCESS     Hx: of left arm abscess related to drug use     Social History:  reports that he has been smoking cigarettes.  He has been smoking about 1.00 pack per day. He has never used smokeless tobacco. He reports that he has current or past drug  history. Drugs: Cocaine, IV, and Heroin. He reports that he does not drink alcohol.  Family History:  Family History  Problem Relation Age of Onset  . Diabetes Father   . Heart disease Father   . Mental illness Sister   . Cancer Neg Hx      Prior to Admission medications   Medication Sig Start Date End Date Taking? Authorizing Provider  acetaminophen (TYLENOL) 325 MG tablet Take 2 tablets (650 mg total) by mouth every 6 (six) hours as needed for mild pain (or Fever >/= 101). 12/19/16   Debbe Odea, MD  Blood Glucose Monitoring Suppl (TRUE METRIX METER) DEVI 1 kit by Does not apply route as directed. Use as directed 06/07/15   Boykin Nearing, MD  collagenase (SANTYL) ointment Apply daily topically. Patient not taking: Reported on 05/15/2017 01/28/17   Alfonse Spruce, FNP  doxycycline (VIBRA-TABS) 100 MG tablet Take 1 tablet (100 mg total) by mouth 2 (two) times daily. 05/15/17   Argentina Donovan, PA-C  gabapentin (NEURONTIN) 100 MG capsule Take 1 capsule (100 mg total) 3 (three) times daily by mouth. Need office visit for additional refills 01/28/17   Fredia Beets R, FNP  glucose blood test strip Use as instructed 08/20/16   Boykin Nearing, MD  insulin aspart (NOVOLOG) 100 UNIT/ML injection Administer insulin sbq injection with meals. CBG Less than 70,  Do not give insulin. CBG 70-140 =   0 units CBG  200 or greater = Give 5 units with meals. 02/18/17   Alfonse Spruce, FNP  insulin NPH-regular Human (NOVOLIN 70/30) (70-30) 100 UNIT/ML injection Inject 50 units sbq daily with breakfast. Inject 40 units sbq daily with supper. 02/18/17   Alfonse Spruce, FNP  Insulin Syringe-Needle U-100 (TRUEPLUS INSULIN SYRINGE) 31G X 5/16" 0.5 ML MISC USE AS DIRECTED. 01/28/17   Hairston, Toy Baker R, FNP  lisinopril (PRINIVIL,ZESTRIL) 10 MG tablet TAKE 1 TABLET BY MOUTH DAILY. 01/28/17   Alfonse Spruce, FNP  ranitidine (ZANTAC) 150 MG tablet Take 150 mg by mouth 2 (two) times  daily.    [provider]  SUBOXONE 8-2 MG FILM Inject 1 Film into the lesion 2 (two) times daily. 12/12/16   [provider]  TRUEPLUS LANCETS 28G MISC Use as directed 01/28/17   Alfonse Spruce, FNP    Physical Exam: Vitals:   07/21/17 1815 07/21/17 1900 07/21/17 2015 07/21/17 2030  BP: (!) 154/79 (!) 138/56 140/75 (!) 142/67  Pulse: 99 95 99 (!) 102  Resp: 18 18 (!) 23 (!) 25  Temp:      TempSrc:      SpO2: 100% 100% 100% 100%  Weight:      Height:       General: Not in acute distress HEENT:       Eyes: PERRL, EOMI, no scleral icterus.       ENT: No discharge from the ears and nose, no pharynx injection, no tonsillar enlargement.        Neck: No JVD, no bruit,  no mass felt. Heme: No neck lymph node enlargement. Cardiac: S1/S2, RRR, No murmurs, No gallops or rubs. Respiratory: No rales, wheezing, rhonchi or rubs. GI: Soft, nondistended, has tenderness in upper abdomen. No rebound pain, no organomegaly, BS present. GU: No hematuria Ext: No pitting leg edema bilaterally. 2+DP/PT pulse bilaterally. Musculoskeletal: No joint deformities, No joint redness or warmth, no limitation of ROM in spin. Skin:  has multiple skin boils and scars. A samll abscess in right posterior forearm right ankle ares has pus draining. Neuro: Alert, oriented X3, cranial nerves II-XII grossly intact, moves all extremities normally. Psych: Patient is not psychotic, no suicidal or hemocidal ideation.  Labs on Admission: I have personally reviewed following labs and imaging studies  CBC: Recent Labs  Lab 07/21/17 1740  WBC 23.2*  HGB 11.9*  HCT 35.7*  MCV 77.8*  PLT 973*   Basic Metabolic Panel: Recent Labs  Lab 07/21/17 1740  NA 131*  K 4.5  CL 87*  CO2 17*  GLUCOSE 590*  BUN 28*  CREATININE 1.87*  CALCIUM 9.2   GFR: Estimated Creatinine Clearance: 58.8 mL/min (A) (by C-G formula based on SCr of 1.87 mg/dL (H)). Liver Function Tests: No results for input(s): AST,  ALT, ALKPHOS, BILITOT, PROT, ALBUMIN in the last 168 hours. No results for input(s): LIPASE, AMYLASE in the last 168 hours. No results for input(s): AMMONIA in the last 168 hours. Coagulation Profile: No results for input(s): INR, PROTIME in the last 168 hours. Cardiac Enzymes: No results for input(s): CKTOTAL, CKMB, CKMBINDEX, TROPONINI in the last 168 hours. BNP (last 3 results) No results for input(s): PROBNP in the last 8760 hours. HbA1C: No results for input(s): HGBA1C in the last 72 hours. CBG: Recent Labs  Lab 07/21/17 1019 07/21/17 1705 07/21/17 1954 07/21/17 2052  GLUCAP 327* 570* 544* 536*   Lipid Profile: No results for input(s): CHOL, HDL, LDLCALC, TRIG, CHOLHDL, LDLDIRECT in the last 72 hours. Thyroid Function Tests: No results for input(s): TSH, T4TOTAL, FREET4, T3FREE, THYROIDAB in the last 72 hours. Anemia Panel: No results for input(s): VITAMINB12, FOLATE, FERRITIN, TIBC, IRON, RETICCTPCT in the last 72 hours. Urine analysis:    Component Value Date/Time   COLORURINE YELLOW 07/21/2017 [place] 07/21/2017 1707   APPEARANCEUR Clear 04/11/2014 0138   LABSPEC 1.028 07/21/2017 1707   LABSPEC 1.026 04/11/2014 0138   PHURINE 5.0 07/21/2017 1707   GLUCOSEU >=500 (A) 07/21/2017 1707   GLUCOSEU >=500 04/11/2014 0138   HGBUR MODERATE (A) 07/21/2017 1707   BILIRUBINUR NEGATIVE 07/21/2017 1707   BILIRUBINUR neg 02/18/2017 0946   BILIRUBINUR Negative 04/11/2014 0138   KETONESUR 80 (A) 07/21/2017 1707   PROTEINUR >=300 (A) 07/21/2017 1707   UROBILINOGEN 0.2 02/18/2017 0946   UROBILINOGEN 0.2 12/10/2013 1221   NITRITE NEGATIVE 07/21/2017 1707   LEUKOCYTESUR NEGATIVE 07/21/2017 1707   LEUKOCYTESUR Negative 04/11/2014 0138   Sepsis Labs: _0 (procalcitonin:4,lacticidven:4) )No results found for this or any previous visit (from the past 240 hour(s)).   Radiological Exams on Admission: No results found.   EKG: Not done in ED, will get one.     Assessment/Plan Principal Problem:   DKA (diabetic ketoacidoses) ([name]) Active Problems:   Drug abuse, IV ([name])   Tobacco abuse   HTN (hypertension)   Skin abscess   Cocaine abuse ([name])   Sepsis ([place])   Type II diabetes mellitus with renal manifestations ([place])   Acute renal failure superimposed on stage 2 chronic kidney disease ([name])   GERD (gastroesophageal reflux  disease)   Abdominal pain   DKA (diabetic ketoacidoses) [facility]): patient has DKA with AG of 27 and bicarbonate of 17, ketone positivity in urine. Mental status is normal. Likely triggered by skin infection and sepsis.  - Admit to stepdown as inpt - 4L of NS bolus - start DKA protocol with BMP q4h - IVF: NS 125 cc/h; will switch to D5-1/2NS when CBG<250 - replete K as needed - Zofran prn nausea  - NPO   Type II diabetes mellitus with renal manifestations: Last A1c 13.1 on 05/08/17, poorly controled. Now has DKA -on DKA protocol  Sepsis due to Skin abscess: Patient meets criteria for sepsis with leukocytosis and tachycardia.  Lactic acid is pending.  Currently hemodynamically stable. -will get Procalcitonin and trend lactic acid levels per sepsis protocol. -IVF: 4L of NS bolus in ED, followed by 125 cc/h  - blood culture x 2 -IV vancomycin -wound care consult  Polysubstance abuse: Including IV Drug abuse, cocaine, tobacco -Did counseling about importance of quitting substance use -Nicotine patch -UDS  HTN: Blood pressure 138/56 -hol lisinopril due to worsening renal functiond  -Start amlodipine 5 mg daily -IV hydralazine prn  GERD: -Pepcid  AoCKD-II: Baseline Cre is 1.3-1.5, pt's Cre is 1.87 and BUN 28 on admission. Likely due to prerenal secondary to dehydration and continuation of ACEI - IVF as above - Follow up renal function by BMP - Check FeNa  - Hold lisinopril  Abdominal pain and nausea, vomiting: Etiology is not clear may be due to DKA, we also need to rule out possibilities, such as  pancreatitis, acute abdomen -CT abdomen/pelvis -Check lipase - Pain control: Patient has history of IV drug abuse, cocaine. Ideally should avoid narcotics, but patient has worsening renal function, cannot use NSAIDs. Cannot give IV ketorolac -will give prn oxycodon and tylenol -check LFT  Back pain: pt states that he has abdominal pain, which is radiating to the mid back.  Patient is also at risk of developing discitis. No alarming symptoms, such as a urinary incontinence, leg weakness or numbness. -Pain control as above -If patient has persistent back pain, may consider MRI of T- and L-spin to r/o discitis.   DVT ppx: SQ Lovenox Code Status: Full code Family Communication: police officers at bedside Disposition Plan:  Anticipate discharge back to previous jail Consults called:  none Admission status: SDU/inpation       Date of Service 07/21/2017    Ivor Costa [facility] Pager [phone]  If 7PM-7AM, please contact night-coverage [email or web address] Password [email or web address] 07/21/2017, 9:25 PM

## 2017-07-21 NOTE — ED Notes (Signed)
Patient arrives to room with sherrif's deputies in foot and wrist shackles, pulses intact, sensation intact past restraints

## 2017-07-21 NOTE — Progress Notes (Signed)
Pharmacy Antibiotic Note  Luke [name] is a 37 y.o. male admitted on 07/21/2017 with skin abscess and possible sepsis.  Pharmacy has been consulted for vancomycin dosing. -WBC= 23.2, afeb, SCr= 1.87 (baseline appears to be 1.4-1.5) and CrCl ~ 60  Plan: -Vancomycin 1750mg  IV x1 followed by 750mg  IV q12h -Will follow renal function, cultures and clinical progress   Height: 5\' 9"  (175.3 cm) Weight: 190 lb (86.2 kg) IBW/kg (Calculated) : 70.7  Temp (24hrs), Avg:98.9 F (37.2 C), Min:98.7 F (37.1 C), Max:99.1 F (37.3 C)  Recent Labs  Lab 07/21/17 1740  WBC 23.2*  CREATININE 1.87*    Estimated Creatinine Clearance: 58.8 mL/min (A) (by C-G formula based on SCr of 1.87 mg/dL (H)).    No Known Allergies  Antimicrobials this admission: 5/5 vancomycin  Dose adjustments this admission:   Microbiology results:  Thank you for allowing pharmacy to be a part of this patient's care.  Hildred Laser, PharmD Clinical Pharmacist 07/21/2017 8:57 PM

## 2017-07-22 ENCOUNTER — Inpatient Hospital Stay (HOSPITAL_COMMUNITY)

## 2017-07-22 DIAGNOSIS — Z794 Long term (current) use of insulin: Secondary | ICD-10-CM

## 2017-07-22 LAB — BASIC METABOLIC PANEL
ANION GAP: 12 (ref 5–15)
ANION GAP: 13 (ref 5–15)
ANION GAP: 12 (ref 5–15)
Anion gap: 14 (ref 5–15)
BUN: 26 mg/dL — AB (ref 6–20)
BUN: 26 mg/dL — AB (ref 6–20)
BUN: 25 mg/dL — AB (ref 6–20)
BUN: 23 mg/dL — ABNORMAL HIGH (ref 6–20)
CHLORIDE: 103 mmol/L (ref 101–111)
CHLORIDE: 105 mmol/L (ref 101–111)
CO2: 16 mmol/L — ABNORMAL LOW (ref 22–32)
CO2: 23 mmol/L (ref 22–32)
CO2: 19 mmol/L — AB (ref 22–32)
CO2: 22 mmol/L (ref 22–32)
CREATININE: 1.31 mg/dL — AB (ref 0.61–1.24)
Calcium: 7.4 mg/dL — ABNORMAL LOW (ref 8.9–10.3)
Calcium: 8.1 mg/dL — ABNORMAL LOW (ref 8.9–10.3)
Calcium: 8 mg/dL — ABNORMAL LOW (ref 8.9–10.3)
Calcium: 8.3 mg/dL — ABNORMAL LOW (ref 8.9–10.3)
Chloride: 105 mmol/L (ref 101–111)
Chloride: 106 mmol/L (ref 101–111)
Creatinine, Ser: 1.6 mg/dL — ABNORMAL HIGH (ref 0.61–1.24)
Creatinine, Ser: 1.44 mg/dL — ABNORMAL HIGH (ref 0.61–1.24)
Creatinine, Ser: 1.41 mg/dL — ABNORMAL HIGH (ref 0.61–1.24)
GFR calc Af Amer: >60 mL/min (ref >60)
GFR calc Af Amer: >60 mL/min (ref >60)
GFR calc Af Amer: >60 mL/min (ref >60)
GFR calc non Af Amer: 54 mL/min — ABNORMAL LOW (ref >60)
GFR calc non Af Amer: >60 mL/min (ref >60)
GFR calc non Af Amer: >60 mL/min (ref >60)
GLUCOSE: 278 mg/dL — AB (ref 65–99)
GLUCOSE: 149 mg/dL — AB (ref 65–99)
GLUCOSE: 115 mg/dL — AB (ref 65–99)
Glucose, Bld: 168 mg/dL — ABNORMAL HIGH (ref 65–99)
POTASSIUM: 6 mmol/L — AB (ref 3.5–5.1)
POTASSIUM: 3.2 mmol/L — AB (ref 3.5–5.1)
POTASSIUM: 3.5 mmol/L (ref 3.5–5.1)
POTASSIUM: 3.6 mmol/L (ref 3.5–5.1)
SODIUM: 135 mmol/L (ref 135–145)
SODIUM: 139 mmol/L (ref 135–145)
Sodium: 138 mmol/L (ref 135–145)
Sodium: 138 mmol/L (ref 135–145)

## 2017-07-22 LAB — GLUCOSE, CAPILLARY
GLUCOSE-CAPILLARY: 171 mg/dL — AB (ref 65–99)
GLUCOSE-CAPILLARY: 237 mg/dL — AB (ref 65–99)
GLUCOSE-CAPILLARY: 201 mg/dL — AB (ref 65–99)
GLUCOSE-CAPILLARY: 158 mg/dL — AB (ref 65–99)
GLUCOSE-CAPILLARY: 136 mg/dL — AB (ref 65–99)
GLUCOSE-CAPILLARY: 107 mg/dL — AB (ref 65–99)
Glucose-Capillary: 134 mg/dL — ABNORMAL HIGH (ref 65–99)
Glucose-Capillary: 127 mg/dL — ABNORMAL HIGH (ref 65–99)
Glucose-Capillary: 150 mg/dL — ABNORMAL HIGH (ref 65–99)
Glucose-Capillary: 179 mg/dL — ABNORMAL HIGH (ref 65–99)
Glucose-Capillary: 189 mg/dL — ABNORMAL HIGH (ref 65–99)
Glucose-Capillary: 150 mg/dL — ABNORMAL HIGH (ref 65–99)

## 2017-07-22 LAB — MRSA PCR SCREENING: MRSA BY PCR: NEGATIVE

## 2017-07-22 LAB — CBG MONITORING, ED
GLUCOSE-CAPILLARY: 138 mg/dL — AB (ref 65–99)
GLUCOSE-CAPILLARY: 231 mg/dL — AB (ref 65–99)
GLUCOSE-CAPILLARY: 300 mg/dL — AB (ref 65–99)
Glucose-Capillary: 231 mg/dL — ABNORMAL HIGH (ref 65–99)
Glucose-Capillary: 202 mg/dL — ABNORMAL HIGH (ref 65–99)
Glucose-Capillary: 124 mg/dL — ABNORMAL HIGH (ref 65–99)

## 2017-07-22 LAB — LACTIC ACID, PLASMA: LACTIC ACID, VENOUS: 1.6 mmol/L (ref 0.5–1.9)

## 2017-07-22 MED ORDER — CLINDAMYCIN HCL 300 MG PO CAPS
300.0000 mg | ORAL_CAPSULE | Freq: Three times a day (TID) | ORAL | Status: DC
  Administered 2017-07-22 – 2017-07-25 (×9): 300 mg via ORAL
  Filled 2017-07-22 (×11): qty 1

## 2017-07-22 MED ORDER — INSULIN ASPART PROT & ASPART (70-30 MIX) 100 UNIT/ML ~~LOC~~ SUSP
20.0000 [IU] | Freq: Two times a day (BID) | SUBCUTANEOUS | Status: DC
  Filled 2017-07-22: qty 10

## 2017-07-22 MED ORDER — INSULIN ASPART 100 UNIT/ML ~~LOC~~ SOLN
0.0000 [IU] | Freq: Three times a day (TID) | SUBCUTANEOUS | Status: DC
Start: 2017-07-22 — End: 2017-07-25
  Administered 2017-07-22: 2 [IU] via SUBCUTANEOUS
  Administered 2017-07-23: 15 [IU] via SUBCUTANEOUS
  Administered 2017-07-23: 11 [IU] via SUBCUTANEOUS
  Administered 2017-07-24: 5 [IU] via SUBCUTANEOUS
  Administered 2017-07-24: 3 [IU] via SUBCUTANEOUS
  Administered 2017-07-25: 5 [IU] via SUBCUTANEOUS

## 2017-07-22 MED ORDER — INSULIN GLARGINE 100 UNIT/ML ~~LOC~~ SOLN
22.0000 [IU] | Freq: Once | SUBCUTANEOUS | Status: AC
  Administered 2017-07-22: 22 [IU] via SUBCUTANEOUS
  Filled 2017-07-22: qty 0.22

## 2017-07-22 MED ORDER — INSULIN ASPART 100 UNIT/ML ~~LOC~~ SOLN: 0.0000 [IU] | Freq: Every day | SUBCUTANEOUS | Status: DC

## 2017-07-22 MED ORDER — BACITRACIN ZINC 500 UNIT/GM EX OINT
TOPICAL_OINTMENT | Freq: Two times a day (BID) | CUTANEOUS | Status: DC
  Administered 2017-07-22 – 2017-07-25 (×7): via TOPICAL
  Filled 2017-07-22 (×2): qty 28.35

## 2017-07-22 MED ORDER — OXYCODONE HCL 5 MG PO TABS
5.0000 mg | ORAL_TABLET | Freq: Four times a day (QID) | ORAL | Status: DC | PRN
  Administered 2017-07-22 – 2017-07-23 (×3): 10 mg via ORAL
  Filled 2017-07-22 (×3): qty 2

## 2017-07-22 MED ORDER — TRAMADOL HCL 50 MG PO TABS
50.0000 mg | ORAL_TABLET | Freq: Four times a day (QID) | ORAL | Status: DC | PRN
  Administered 2017-07-23 – 2017-07-25 (×5): 50 mg via ORAL
  Filled 2017-07-22 (×5): qty 1

## 2017-07-22 MED ORDER — VANCOMYCIN HCL IN DEXTROSE 750-5 MG/150ML-% IV SOLN
750.0000 mg | Freq: Two times a day (BID) | INTRAVENOUS | Status: DC
  Administered 2017-07-22: 750 mg via INTRAVENOUS
  Filled 2017-07-22: qty 150

## 2017-07-22 NOTE — ED Notes (Signed)
Patient transported to CT 

## 2017-07-22 NOTE — ED Notes (Signed)
Attempted to call report

## 2017-07-22 NOTE — Progress Notes (Signed)
[place] TEAM 1 - Stepdown/ICU TEAM  Luke [name]  WSF:[id number] DOB: [birth year] DOA: 07/21/2017 PCP: Patient, No Pcp Per    Brief Narrative:  37yo male with a history of hypertension, diabetes mellitus, GERD, anemia, and polysubstance abuse (tobacco, cocaine, IV drug user) who presented with nausea, vomiting, abdominal pain, and multiple boils of the legs and arms.  Pt is in the custody of the [facility] Dept. He stated that prior to beingincarcerated he was taking 70/30 insulin twice a day and regular insulin sliding scale. For the 3 days preceding this admit while in jail he reported he was being treated only with regular insulin.  In the ED he was found to be in DKA.  Significant Events: 5/5 admit in DKA   Subjective: The patient appears to be resting comfortably in bed but reports that he is suffering with intractable abdominal pain.  He has not vomited today but does report some ongoing nausea.  He denies shortness of breath but states his chest is sore from his multiple episodes of vomiting yesterday.  He states he is hungry.  Assessment & Plan:  Uncontrolled DM2 - DKA Gap now closed / bicarb normalized - transition off insulin gtt - follow CBGs closely and monitor for return to DKA   Polysubstance abuse Avoid use of narcotics   Sepsis due to Multiple cutaneous boils ?due to substance abuse - cont oral abx   HTN BP well controlled   AKI on CKD  Baseline crt 1.3-1.5 - crt has returned to baseline w/ volume expansion   Abdom/Back pain  Likely due to DKA itself - no acute findings on CTabdom  DVT prophylaxis: lovenox Code Status: FULL CODE Family Communication: no family present at time of exam  Disposition Plan: tele bed - follow CBGs - follow ability to eat - possible d/c 24-48hrs   Consultants:  none  Antimicrobials:  Vancomycin 5/5 > 5/6 Augmentin 5/5 >   Objective: Blood pressure 138/72, pulse 79, temperature 98.7 F (37.1 C),  temperature source Oral, resp. rate (!) 28, height 5\' 9"  (1.753 m), weight 84.3 kg (185 lb 13.6 oz), SpO2 97 %.  Intake/Output Summary (Last 24 hours) at 07/22/2017 1539 Last data filed at 07/22/2017 1500 Gross per 24 hour  Intake 5260.96 ml  Output 1400 ml  Net 3860.96 ml   Filed Weights   07/21/17 1021 07/22/17 0715  Weight: 86.2 kg (190 lb) 84.3 kg (185 lb 13.6 oz)    Examination: General: No acute respiratory distress Lungs: Clear to auscultation bilaterally without wheezes or crackles Cardiovascular: Regular rate and rhythm without murmur gallop or rub normal S1 and S2 Abdomen: Nontender, nondistended, soft, bowel sounds positive, no rebound, no ascites, no appreciable mass Extremities: No significant cyanosis, clubbing, or edema bilateral lower extremities  CBC: Recent Labs  Lab 07/21/17 1740  WBC 23.2*  HGB 11.9*  HCT 35.7*  MCV 77.8*  PLT 017*   Basic Metabolic Panel: Recent Labs  Lab 07/22/17 0515 07/22/17 0720 07/22/17 1144  NA 138 138 139  K 3.2* 3.5 3.6  CL 103 106 105  CO2 23 19* 22  GLUCOSE 149* 115* 168*  BUN 26* 25* 23*  CREATININE 1.44* 1.41* 1.31*  CALCIUM 8.1* 8.0* 8.3*   GFR: Estimated Creatinine Clearance: 77.2 mL/min (A) (by C-G formula based on SCr of 1.31 mg/dL (H)).  Liver Function Tests: Recent Labs  Lab 07/21/17 2151  AST 18  ALT 18  ALKPHOS 145*  BILITOT 1.7*  PROT 7.1  ALBUMIN 3.0*   Recent Labs  Lab 07/21/17 2151  LIPASE 34    HbA1C: Hemoglobin A1C  Date/Time Value Ref Range Status  05/15/2017 02:34 PM 13.1  Final  10/31/2015 11:57 AM 11.0  Final  04/11/2014 12:54 AM 11.3 (H) 4.2 - 6.3 % Final    Comment:    The American Diabetes Association recommends that a primary goal of therapy should be <7% and that physicians should reevaluate the treatment regimen in patients with HbA1c values consistently >8%.    Hgb A1c MFr Bld  Date/Time Value Ref Range Status  12/17/2016 01:22 AM 10.6 (H) 4.8 - 5.6 % Final     Comment:    (NOTE) Pre diabetes:          5.7%-6.4% Diabetes:              >6.4% Glycemic control for   <7.0% adults with diabetes   12/03/2014 09:20 AM 10.6 (H) 4.8 - 5.6 % Final    Comment:    (NOTE)         Pre-diabetes: 5.7 - 6.4         Diabetes: >6.4         Glycemic control for adults with diabetes: <7.0     CBG: Recent Labs  Lab 07/22/17 1046 07/22/17 1147 07/22/17 1254 07/22/17 1359 07/22/17 1501  GLUCAP 171* 179* 189* 237* 201*    Recent Results (from the past 240 hour(s))  Culture, blood (x 2)     Status: None (Preliminary result)   Collection Time: 07/21/17  9:25 PM  Result Value Ref Range Status   Specimen Description BLOOD RIGHT HAND  Final   Special Requests   Final    BOTTLES DRAWN AEROBIC ONLY Blood Culture results may not be optimal due to an inadequate volume of blood received in culture bottles   Culture   Final    NO GROWTH < 12 HOURS Performed at [facility], [place] [address], [place], [place] [address]    Report Status PENDING  Incomplete  Culture, blood (x 2)     Status: None (Preliminary result)   Collection Time: 07/21/17  9:51 PM  Result Value Ref Range Status   Specimen Description BLOOD LEFT FOOT  Final   Special Requests   Final    BOTTLES DRAWN AEROBIC ONLY Blood Culture results may not be optimal due to an inadequate volume of blood received in culture bottles   Culture   Final    NO GROWTH < 12 HOURS Performed at [facility], [place] [address] [place], [place] [address]    Report Status PENDING  Incomplete  MRSA PCR Screening     Status: None   Collection Time: 07/22/17  7:33 AM  Result Value Ref Range Status   MRSA by PCR NEGATIVE NEGATIVE Final    Comment:        The GeneXpert MRSA Assay (FDA approved for NASAL specimens only), is one component of a comprehensive MRSA colonization surveillance program. It is not intended to diagnose MRSA infection nor to guide or monitor treatment for MRSA  infections. Performed at [facility], [place] [address] [place],  [address]      Scheduled Meds: . amLODipine  5 mg Oral Daily  . bacitracin   Topical BID  . enoxaparin (LOVENOX) injection  40 mg Subcutaneous Q24H  . famotidine  20 mg Oral Daily  . gabapentin  100 mg Oral TID  . nicotine  21  mg Transdermal Daily     LOS: 1 day   Cherene Altes, MD [facility] Office  [phone] Pager - Text Page per Amion as per below:  On-Call/Text Page:      [email or web address]      password [email or web address]  If 7PM-7AM, please contact night-coverage [email or web address] Password [facility] 07/22/2017, 3:39 PM

## 2017-07-22 NOTE — Consult Note (Signed)
[place] Nurse wound consult note Reason for Consult: Numerous full thickness wounds secondary to abscess formation. Wound are located at the bilateral medial malleoli and there are many areas on the bilateral upper extremities.  Wound type:Infectious Pressure Injury POA: NA Measurement: The largest open area is on the right upper extremity and measures 2cm x 0.6cm x 0.4cm with a pink, moist wound bed and scant serous drainage.  There are linear presentations of more shallow wounds on the right UR, both medially and laterally and on the left LE laterally and medially.  Wounds are similar in appearance, somewhat oval, slightly greater in length than width and full thickness.  They are pink or covered with dried serum (scabs), and dry. Wound bed: As described above Drainage (amount, consistency, odor) As described above Periwound: With evidence of previous wound healing, i.e., scars. No erythema, induration or warmth Dressing procedure/placement/frequency: I will implement a conservative POC using a thin layer of bacitracin ointment following soap and water cleanse and pat dry. The ointment is to be covered with dry gauze dressings and secure with Kerlix roll gauze/papaer tape.  [place] nursing team will not follow, but will remain available to this patient, the nursing and medical teams.  Please re-consult if needed. Thanks, Maudie Flakes, MSN, RN, [place], [facility]  Pager# [phone]

## 2017-07-22 NOTE — ED Notes (Signed)
Verbal order given by EDP Dr. Eulis Foster to start PIV in foot if able. This RN to attempt at this time.

## 2017-07-23 ENCOUNTER — Encounter (HOSPITAL_COMMUNITY): Payer: Self-pay | Admitting: General Practice

## 2017-07-23 ENCOUNTER — Other Ambulatory Visit: Payer: Self-pay

## 2017-07-23 DIAGNOSIS — L089 Local infection of the skin and subcutaneous tissue, unspecified: Secondary | ICD-10-CM

## 2017-07-23 LAB — COMPREHENSIVE METABOLIC PANEL
ALT: 12 U/L — AB (ref 17–63)
AST: 18 U/L (ref 15–41)
Albumin: 2.4 g/dL — ABNORMAL LOW (ref 3.5–5.0)
Alkaline Phosphatase: 109 U/L (ref 38–126)
Anion gap: 12 (ref 5–15)
BUN: 17 mg/dL (ref 6–20)
CHLORIDE: 105 mmol/L (ref 101–111)
CO2: 20 mmol/L — AB (ref 22–32)
CREATININE: 1.15 mg/dL (ref 0.61–1.24)
Calcium: 8.2 mg/dL — ABNORMAL LOW (ref 8.9–10.3)
GFR calc Af Amer: >60 mL/min (ref >60)
GFR calc non Af Amer: >60 mL/min (ref >60)
Glucose, Bld: 221 mg/dL — ABNORMAL HIGH (ref 65–99)
POTASSIUM: 4 mmol/L (ref 3.5–5.1)
SODIUM: 137 mmol/L (ref 135–145)
TOTAL PROTEIN: 5.7 g/dL — AB (ref 6.5–8.1)
Total Bilirubin: 0.6 mg/dL (ref 0.3–1.2)

## 2017-07-23 LAB — GLUCOSE, CAPILLARY
GLUCOSE-CAPILLARY: 302 mg/dL — AB (ref 65–99)
GLUCOSE-CAPILLARY: 369 mg/dL — AB (ref 65–99)
Glucose-Capillary: 302 mg/dL — ABNORMAL HIGH (ref 65–99)
Glucose-Capillary: 329 mg/dL — ABNORMAL HIGH (ref 65–99)
Glucose-Capillary: 96 mg/dL (ref 65–99)

## 2017-07-23 LAB — CBC
HCT: 29.2 % — ABNORMAL LOW (ref 39.0–52.0)
Hemoglobin: 9.5 g/dL — ABNORMAL LOW (ref 13.0–17.0)
MCH: 25.5 pg — ABNORMAL LOW (ref 26.0–34.0)
MCHC: 32.5 g/dL (ref 30.0–36.0)
MCV: 78.3 fL (ref 78.0–100.0)
PLATELETS: 484 10*3/uL — AB (ref 150–400)
RBC: 3.73 MIL/uL — ABNORMAL LOW (ref 4.22–5.81)
RDW: 15 % (ref 11.5–15.5)
WBC: 18.5 10*3/uL — AB (ref 4.0–10.5)

## 2017-07-23 MED ORDER — INSULIN ASPART PROT & ASPART (70-30 MIX) 100 UNIT/ML ~~LOC~~ SUSP
30.0000 [IU] | Freq: Two times a day (BID) | SUBCUTANEOUS | Status: DC
  Administered 2017-07-23 (×2): 30 [IU] via SUBCUTANEOUS

## 2017-07-23 MED ORDER — INSULIN ASPART PROT & ASPART (70-30 MIX) 100 UNIT/ML ~~LOC~~ SUSP: 30.0000 [IU] | Freq: Two times a day (BID) | SUBCUTANEOUS | Status: DC

## 2017-07-23 MED ORDER — SUCRALFATE 1 GM/10ML PO SUSP
1.0000 g | Freq: Three times a day (TID) | ORAL | Status: DC
  Administered 2017-07-23 – 2017-07-25 (×9): 1 g via ORAL
  Filled 2017-07-23 (×9): qty 10

## 2017-07-23 MED ORDER — OXYCODONE HCL 5 MG PO TABS
5.0000 mg | ORAL_TABLET | Freq: Four times a day (QID) | ORAL | Status: DC | PRN
Start: 2017-07-23 — End: 2017-07-25
  Administered 2017-07-23 – 2017-07-25 (×8): 5 mg via ORAL
  Filled 2017-07-23 (×8): qty 1

## 2017-07-23 MED ORDER — INSULIN ASPART PROT & ASPART (70-30 MIX) 100 UNIT/ML ~~LOC~~ SUSP
40.0000 [IU] | Freq: Two times a day (BID) | SUBCUTANEOUS | Status: DC
  Administered 2017-07-24 – 2017-07-25 (×2): 40 [IU] via SUBCUTANEOUS
  Filled 2017-07-23: qty 10

## 2017-07-23 MED ORDER — METOCLOPRAMIDE HCL 5 MG PO TABS
5.0000 mg | ORAL_TABLET | Freq: Three times a day (TID) | ORAL | Status: DC
  Administered 2017-07-23 – 2017-07-25 (×9): 5 mg via ORAL
  Filled 2017-07-23 (×9): qty 1

## 2017-07-23 MED ORDER — PANTOPRAZOLE SODIUM 40 MG PO TBEC
40.0000 mg | DELAYED_RELEASE_TABLET | Freq: Every day | ORAL | Status: DC
  Administered 2017-07-23 – 2017-07-25 (×3): 40 mg via ORAL
  Filled 2017-07-23 (×3): qty 1

## 2017-07-23 MED ORDER — ISOSORB DINITRATE-HYDRALAZINE 20-37.5 MG PO TABS
1.0000 | ORAL_TABLET | Freq: Three times a day (TID) | ORAL | Status: DC
  Administered 2017-07-23 – 2017-07-25 (×7): 1 via ORAL
  Filled 2017-07-23 (×7): qty 1

## 2017-07-23 NOTE — Progress Notes (Signed)
Inpatient Diabetes Program Recommendations  AACE/ADA: New Consensus Statement on Inpatient Glycemic Control (2015)  Target Ranges:  Prepandial:   less than 140 mg/dL      Peak postprandial:   less than 180 mg/dL (1-2 hours)      Critically ill patients:  140 - 180 mg/dL   Results for Luke, [name] (MRN [id number]) as of 07/23/2017 10:33  Ref. Range 07/22/2017 16:00 07/22/2017 16:58 07/22/2017 17:59 07/22/2017 20:51 07/23/2017 06:01 07/23/2017 08:08  Glucose-Capillary Latest Ref Range: 65 - 99 mg/dL 158 (H) 136 (H) 107 (H) 150 (H) 302 (H) 302 (H)  Results for Luke, [name] (MRN [id number]) as of 07/23/2017 10:33  Ref. Range 05/15/2017 14:34 07/21/2017 17:40 07/21/2017 21:51  Glucose Latest Ref Range: 65 - 99 mg/dL  590 (HH) 504 (HH)  Hemoglobin A1C Unknown 13.1     Review of Glycemic Control  Diabetes history: DM1 (makes no insulin; requires basal, correction, and meal coverage insulin) Outpatient Diabetes medications: 70/30 30 units daily (in past notes pt was taking 70/30 50 units QAM and 30 units QPM and Dr. Thereasa Solo notes today patient reported taking 70/30 BID along with Regular per sliding scale) Current orders for Inpatient glycemic control: 70/30 30 units BID, Novolog 0-15 units TID with meals, NOvolog 0-5 units QHS  Inpatient Diabetes Program Recommendations: Insulin-Basal: Patient received Lantus 22 units at 17:13 on 07/22/17 at time of transition off IV insulin drip and fasting glucose 302 mg/dl this morning. Patient has already been given 70/30 30 units at 8:30 am today.   Insulin-Correction: Please consider decreasing Novolog to sensitive scale (0-9 units). Outpatient DM medication regimen: Patient has Type 1 DM history and will require insulin for basal, correction, and meal coverage insulin. At time of discharge, please prescribe 70/30 BID insulin regimen and patient will need to receive while incarcerated to prevent DKA.  NOTE: Patient has had several admission in the past few years for DKA.  Noted in progress note today by Dr. Thereasa Solo, patient usually takes "70/30 BID and Regular insulin sliding scale as an outpatient. For the 3 days preceding this admit while in jail he reported he was being treated only with regular insulin." Patient was last seen at [facility] on 05/15/17 and A1C was noted to be 13.1% at that time and patient reported he was taking 70/30 40 units with breakfast and 30 units with supper. Patient will need to be given 70/30 insulin BID while incarcerated to prevent DKA.  Thanks, Barnie Alderman, RN, MSN, CDE Diabetes Coordinator Inpatient Diabetes Program [phone] (Team Pager from 8am to 5pm)

## 2017-07-23 NOTE — Plan of Care (Signed)
  Problem: Education: Goal: Ability to describe self-care measures that may prevent or decrease complications (Diabetes Survival Skills Education) will improve Outcome: Progressing   Problem: Health Behavior/Discharge Planning: Goal: Ability to identify and utilize available resources and services will improve Outcome: Progressing Goal: Ability to manage health-related needs will improve Outcome: Progressing   Problem: Fluid Volume: Goal: Ability to achieve a balanced intake and output will improve Outcome: Progressing   Problem: Metabolic: Goal: Ability to maintain appropriate glucose levels will improve Outcome: Progressing   Problem: Nutritional: Goal: Maintenance of adequate nutrition will improve Outcome: Progressing Goal: Maintenance of adequate weight for body size and type will improve Outcome: Progressing

## 2017-07-23 NOTE — Progress Notes (Addendum)
[facility] TEAM 1 - Stepdown/ICU TEAM  DAIWIK BUFFALO  JQZ:[id number] DOB: Dec 30, 1980 DOA: 07/21/2017 PCP: Patient, No Pcp Per    Brief Narrative:  37yo male with a history of hypertension, diabetes mellitus, GERD, anemia, and polysubstance abuse (tobacco, cocaine, IV drug user) who presented with nausea, vomiting, abdominal pain, and multiple boils of the legs and arms.  Pt is in the custody of the [facility] Dept. He stated that prior to beingincarcerated he was taking 70/30 insulin twice a day and regular insulin sliding scale. For the 3 days preceding this admit while in jail he reported he was being treated only with regular insulin.  In the ED he was found to be in DKA.  Significant Events: 5/5 admit in DKA  5/6 insulin gtt stopped   Subjective: The patient complains of unrelenting abdominal and epigastric pain.  He denies current nausea or vomiting but is afraid to eat at a fear that it will bring on the symptoms.  He reports chest soreness from his multiple episodes of vomiting prior to his presentation.  He denies shortness of breath or headache or diarrhea.    Assessment & Plan:  Uncontrolled DM2 - DKA Now off insulin gtt, but CBG remains very poorly controlled - transitioned to 70/30 regimen (used at home) today - adjust dosing and follow - no evidence of return to DKA thus far  Abdom/Back pain  Initially felt to be due to DKA itself - no acute findings on CTabdom - trial of reglan and carafate - strive for CBG control - lipase is normal   Normo/Microcytic Anemia Quiet unusual in a 37yo male - ?due to chronic cutaneous infections - check anemia panel and quiac stools - if pt is Fe deficient or guiac + he will need EGD to evaluate his sx of GI pain +/- colo  Polysubstance abuse Avoid use of narcotics as able - UDS negative at admit   Sepsis due to Multiple cutaneous boils ?due to substance abuse - cont oral abx - WBC trending down - blood cx negative    HTN BP trending upward - initiate BiDil and follow   AKI on CKD  Baseline crt 1.3-1.5 - crt has returned to baseline w/ volume expansion  Recent Labs  Lab 07/22/17 0102 07/22/17 0515 07/22/17 0720 07/22/17 1144 07/23/17 0328  CREATININE 1.60* 1.44* 1.41* 1.31* 1.15     DVT prophylaxis: lovenox Code Status: FULL CODE Family Communication: no family present at time of exam  Disposition Plan: tele bed - follow CBGs - follow ability to eat / abdom pain  Consultants:  none  Antimicrobials:  Vancomycin 5/5 > 5/6 Augmentin 5/5 Clindamycin 5/6 >  Objective: Blood pressure (!) 151/80, pulse 61, temperature 98.1 F (36.7 C), temperature source Oral, resp. rate 16, height 5\' 9"  (1.753 m), weight 84.3 kg (185 lb 13.6 oz), SpO2 95 %.  Intake/Output Summary (Last 24 hours) at 07/23/2017 0855 Last data filed at 07/23/2017 0602 Gross per 24 hour  Intake 306 ml  Output 950 ml  Net -644 ml   Filed Weights   07/21/17 1021 07/22/17 0715  Weight: 86.2 kg (190 lb) 84.3 kg (185 lb 13.6 oz)    Examination: General: No acute respiratory distress - alert  Lungs: CT B - no wheezing  Cardiovascular: RRR - no M or rub  Abdomen: Nontender, nondistended, soft, bowel sounds positive, no rebound, no ascites Extremities: No significant edema B LE   CBC: Recent Labs  Lab 07/21/17 1740 07/23/17  0328  WBC 23.2* 18.5*  HGB 11.9* 9.5*  HCT 35.7* 29.2*  MCV 77.8* 78.3  PLT 588* 175*   Basic Metabolic Panel: Recent Labs  Lab 07/22/17 0720 07/22/17 1144 07/23/17 0328  NA 138 139 137  K 3.5 3.6 4.0  CL 106 105 105  CO2 19* 22 20*  GLUCOSE 115* 168* 221*  BUN 25* 23* 17  CREATININE 1.41* 1.31* 1.15  CALCIUM 8.0* 8.3* 8.2*   GFR: Estimated Creatinine Clearance: 87.9 mL/min (by C-G formula based on SCr of 1.15 mg/dL).  Liver Function Tests: Recent Labs  Lab 07/21/17 2151 07/23/17 0328  AST 18 18  ALT 18 12*  ALKPHOS 145* 109  BILITOT 1.7* 0.6  PROT 7.1 5.7*  ALBUMIN 3.0*  2.4*   Recent Labs  Lab 07/21/17 2151  LIPASE 34    HbA1C: Hemoglobin A1C  Date/Time Value Ref Range Status  05/15/2017 02:34 PM 13.1  Final  10/31/2015 11:57 AM 11.0  Final  04/11/2014 12:54 AM 11.3 (H) 4.2 - 6.3 % Final    Comment:    The American Diabetes Association recommends that a primary goal of therapy should be <7% and that physicians should reevaluate the treatment regimen in patients with HbA1c values consistently >8%.    Hgb A1c MFr Bld  Date/Time Value Ref Range Status  12/17/2016 01:22 AM 10.6 (H) 4.8 - 5.6 % Final    Comment:    (NOTE) Pre diabetes:          5.7%-6.4% Diabetes:              >6.4% Glycemic control for   <7.0% adults with diabetes   12/03/2014 09:20 AM 10.6 (H) 4.8 - 5.6 % Final    Comment:    (NOTE)         Pre-diabetes: 5.7 - 6.4         Diabetes: >6.4         Glycemic control for adults with diabetes: <7.0     CBG: Recent Labs  Lab 07/22/17 1658 07/22/17 1759 07/22/17 2051 07/23/17 0601 07/23/17 0808  GLUCAP 136* 107* 150* 302* 302*    Recent Results (from the past 240 hour(s))  Culture, blood (x 2)     Status: None (Preliminary result)   Collection Time: 07/21/17  9:25 PM  Result Value Ref Range Status   Specimen Description BLOOD RIGHT HAND  Final   Special Requests   Final    BOTTLES DRAWN AEROBIC ONLY Blood Culture results may not be optimal due to an inadequate volume of blood received in culture bottles   Culture   Final    NO GROWTH < 12 HOURS Performed at [facility], [place] [address] [place], [place] [address]    Report Status PENDING  Incomplete  Culture, blood (x 2)     Status: None (Preliminary result)   Collection Time: 07/21/17  9:51 PM  Result Value Ref Range Status   Specimen Description BLOOD LEFT FOOT  Final   Special Requests   Final    BOTTLES DRAWN AEROBIC ONLY Blood Culture results may not be optimal due to an inadequate volume of blood received in culture bottles   Culture   Final    NO  GROWTH < 12 HOURS Performed at [facility], [place] [address], [place], [place] [address]    Report Status PENDING  Incomplete  MRSA PCR Screening     Status: None   Collection Time: 07/22/17  7:33 AM  Result Value Ref Range Status   MRSA by PCR NEGATIVE NEGATIVE Final    Comment:        The GeneXpert MRSA Assay (FDA approved for NASAL specimens only), is one component of a comprehensive MRSA colonization surveillance program. It is not intended to diagnose MRSA infection nor to guide or monitor treatment for MRSA infections. Performed at [facility], [place] [address], [place], [place] [address]      Scheduled Meds: . amLODipine  5 mg Oral Daily  . bacitracin   Topical BID  . clindamycin  300 mg Oral Q8H  . enoxaparin (LOVENOX) injection  40 mg Subcutaneous Q24H  . famotidine  20 mg Oral Daily  . gabapentin  100 mg Oral TID  . insulin aspart  0-15 Units Subcutaneous TID WC  . insulin aspart  0-5 Units Subcutaneous QHS  . insulin aspart protamine- aspart  30 Units Subcutaneous BID WC  . nicotine  21 mg Transdermal Daily     LOS: 2 days   Cherene Altes, MD [facility] Office  [phone] Pager - Text Page per Shea Evans as per below:  On-Call/Text Page:      [email or web address]      password [email or web address]  If 7PM-7AM, please contact night-coverage [email or web address] Password [email or web address] 07/23/2017, 8:55 AM

## 2017-07-24 LAB — BASIC METABOLIC PANEL
Anion gap: 12 (ref 5–15)
BUN: 12 mg/dL (ref 6–20)
CALCIUM: 8.2 mg/dL — AB (ref 8.9–10.3)
CO2: 19 mmol/L — AB (ref 22–32)
CREATININE: 1.12 mg/dL (ref 0.61–1.24)
Chloride: 104 mmol/L (ref 101–111)
Glucose, Bld: 114 mg/dL — ABNORMAL HIGH (ref 65–99)
Potassium: 3.3 mmol/L — ABNORMAL LOW (ref 3.5–5.1)
Sodium: 135 mmol/L (ref 135–145)

## 2017-07-24 LAB — OCCULT BLOOD X 1 CARD TO LAB, STOOL: FECAL OCCULT BLD: POSITIVE — AB

## 2017-07-24 LAB — CBC
HEMATOCRIT: 31.3 % — AB (ref 39.0–52.0)
HEMOGLOBIN: 10.2 g/dL — AB (ref 13.0–17.0)
MCH: 25.2 pg — ABNORMAL LOW (ref 26.0–34.0)
MCHC: 32.6 g/dL (ref 30.0–36.0)
MCV: 77.5 fL — ABNORMAL LOW (ref 78.0–100.0)
Platelets: 516 10*3/uL — ABNORMAL HIGH (ref 150–400)
RBC: 4.04 MIL/uL — ABNORMAL LOW (ref 4.22–5.81)
RDW: 14.2 % (ref 11.5–15.5)
WBC: 16.5 10*3/uL — ABNORMAL HIGH (ref 4.0–10.5)

## 2017-07-24 LAB — RETICULOCYTES
RBC.: 4.04 MIL/uL — AB (ref 4.22–5.81)
Retic Count, Absolute: 32.3 10*3/uL (ref 19.0–186.0)
Retic Ct Pct: 0.8 % (ref 0.4–3.1)

## 2017-07-24 LAB — IRON AND TIBC
IRON: 42 ug/dL — AB (ref 45–182)
Saturation Ratios: 20 % (ref 17.9–39.5)
TIBC: 214 ug/dL — ABNORMAL LOW (ref 250–450)
UIBC: 172 ug/dL

## 2017-07-24 LAB — GLUCOSE, CAPILLARY
GLUCOSE-CAPILLARY: 243 mg/dL — AB (ref 65–99)
GLUCOSE-CAPILLARY: 66 mg/dL (ref 65–99)
Glucose-Capillary: 82 mg/dL (ref 65–99)
Glucose-Capillary: 186 mg/dL — ABNORMAL HIGH (ref 65–99)

## 2017-07-24 LAB — FERRITIN: Ferritin: 222 ng/mL (ref 24–336)

## 2017-07-24 LAB — VITAMIN B12: Vitamin B-12: 513 pg/mL (ref 180–914)

## 2017-07-24 LAB — FOLATE: FOLATE: 10.2 ng/mL (ref >5.9)

## 2017-07-24 LAB — HIV ANTIBODY (ROUTINE TESTING W REFLEX): HIV SCREEN 4TH GENERATION: NONREACTIVE

## 2017-07-24 MED ORDER — INSULIN ASPART PROT & ASPART (70-30 MIX) 100 UNIT/ML ~~LOC~~ SUSP
15.0000 [IU] | Freq: Once | SUBCUTANEOUS | Status: AC
  Administered 2017-07-24: 15 [IU] via SUBCUTANEOUS
  Filled 2017-07-24: qty 10

## 2017-07-24 MED ORDER — ZOLPIDEM TARTRATE 5 MG PO TABS
5.0000 mg | ORAL_TABLET | Freq: Every evening | ORAL | Status: DC | PRN
  Administered 2017-07-25: 5 mg via ORAL
  Filled 2017-07-24: qty 1

## 2017-07-24 MED ORDER — POTASSIUM CHLORIDE CRYS ER 20 MEQ PO TBCR
40.0000 meq | EXTENDED_RELEASE_TABLET | Freq: Once | ORAL | Status: AC
  Administered 2017-07-24: 40 meq via ORAL
  Filled 2017-07-24: qty 2

## 2017-07-24 NOTE — Progress Notes (Addendum)
PROGRESS NOTE    Luke [name]  ZOX:[id number] DOB: [birth year] DOA: 07/21/2017 PCP: Patient, No Pcp Per   Brief Narrative:37yo malewith a history ofhypertension, diabetes mellitus, GERD, anemia, and polysubstance abuse (tobacco, cocaine, IV drug user) who presented with nausea, vomiting, abdominal pain, and multiple boils of the legs and arms.  Pt isin the custody of the [facility].He stated that prior to beingincarcerated he was taking 70/30 insulin twice a day and regular insulin sliding scale. For the 3 days preceding this admit while in jail he reported he was being treated only with regular insulin.  In the ED he was found to be in DKA.    Assessment & Plan:   Principal Problem:   DKA (diabetic ketoacidoses) ([place]) Active Problems:   Drug abuse, IV ([place])   Tobacco abuse   HTN (hypertension)   Skin abscess   Cocaine abuse ([place])   Sepsis ([place])   Type 2 diabetes mellitus with ketoacidosis without coma, with long-term current use of insulin ([name])   Acute renal failure superimposed on stage 2 chronic kidney disease ([name])   GERD (gastroesophageal reflux disease)   Abdominal pain  1]s/p dka on 70/30 insulin.blood sugars better.appetite not great so eating less.abdominal pain better.  2]htn stable  3]polysubstance abuse  4]acute on ckd improved with IV hydration.  5] multiple cutaneous boils on clindamycin  6]anemia -check fobt.  Lab was unable to draw anemia panel this morning after multiple sticks from different people.  Patient has no good veins.  7] hypokalemia replete and recheck DVT prophylaxis: lovenox Code Status:full Family Communication:none Disposition Plan: tbd  Consultants: none   Procedures: none Antimicrobials: none Subjective:no new complaints  Objective: Vitals:   07/23/17 1115 07/23/17 1607 07/23/17 2011 07/24/17 0621  BP: 121/66 (!) 144/80 125/73 (!) 141/72  Pulse: 86 65 81 68  Resp: 20 20 19  (!) 26  Temp: 98.8  F (37.1 C) 99.1 F (37.3 C) 98.9 F (37.2 C) 98.3 F (36.8 C)  TempSrc: Oral Oral Oral Oral  SpO2: 97% 97% 95% 99%  Weight:      Height:        Intake/Output Summary (Last 24 hours) at 07/24/2017 0905 Last data filed at 07/24/2017 0100 Gross per 24 hour  Intake 684 ml  Output 1150 ml  Net -466 ml   Filed Weights   07/21/17 1021 07/22/17 0715  Weight: 86.2 kg (190 lb) 84.3 kg (185 lb 13.6 oz)    Examination:  General exam: Appears calm and comfortable  Respiratory system: Clear to auscultation. Respiratory effort normal. Cardiovascular system: S1 & S2 heard, RRR. No JVD, murmurs, rubs, gallops or clicks. No pedal edema. Gastrointestinal system: Abdomen is nondistended, soft and nontender. No organomegaly or masses felt. Normal bowel sounds heard. Central nervous system: Alert and oriented. No focal neurological deficits. Extremities: Symmetric 5 x 5 power. Skin: No rashes, lesions or ulcers Psychiatry: Judgement and insight appear normal. Mood & affect appropriate.     Data Reviewed: I have personally reviewed following labs and imaging studies  CBC: Recent Labs  Lab 07/21/17 1740 07/23/17 0328 07/24/17 0731  WBC 23.2* 18.5* 16.5*  HGB 11.9* 9.5* 10.2*  HCT 35.7* 29.2* 31.3*  MCV 77.8* 78.3 77.5*  PLT 588* 484* 811*   Basic Metabolic Panel: Recent Labs  Lab 07/22/17 0515 07/22/17 0720 07/22/17 1144 07/23/17 0328 07/24/17 0731  NA 138 138 139 137 135  K 3.2* 3.5 3.6 4.0 3.3*  CL 103 106 105 105 104  CO2 23 19* 22 20* 19*  GLUCOSE 149* 115* 168* 221* 114*  BUN 26* 25* 23* 17 12  CREATININE 1.44* 1.41* 1.31* 1.15 1.12  CALCIUM 8.1* 8.0* 8.3* 8.2* 8.2*   GFR: Estimated Creatinine Clearance: 90.3 mL/min (by C-G formula based on SCr of 1.12 mg/dL). Liver Function Tests: Recent Labs  Lab 07/21/17 2151 07/23/17 0328  AST 18 18  ALT 18 12*  ALKPHOS 145* 109  BILITOT 1.7* 0.6  PROT 7.1 5.7*  ALBUMIN 3.0* 2.4*   Recent Labs  Lab 07/21/17 2151    LIPASE 34   No results for input(s): AMMONIA in the last 168 hours. Coagulation Profile: No results for input(s): INR, PROTIME in the last 168 hours. Cardiac Enzymes: No results for input(s): CKTOTAL, CKMB, CKMBINDEX, TROPONINI in the last 168 hours. BNP (last 3 results) No results for input(s): PROBNP in the last 8760 hours. HbA1C: No results for input(s): HGBA1C in the last 72 hours. CBG: Recent Labs  Lab 07/23/17 0808 07/23/17 1113 07/23/17 1610 07/23/17 2139 07/24/17 0622  GLUCAP 302* 369* 329* 96 82   Lipid Profile: No results for input(s): CHOL, HDL, LDLCALC, TRIG, CHOLHDL, LDLDIRECT in the last 72 hours. Thyroid Function Tests: No results for input(s): TSH, T4TOTAL, FREET4, T3FREE, THYROIDAB in the last 72 hours. Anemia Panel: Recent Labs    07/24/17 0731  RETICCTPCT 0.8   Sepsis Labs: Recent Labs  Lab 07/21/17 2151 07/22/17 0102  PROCALCITON 0.31  --   LATICACIDVEN 2.3* 1.6    Recent Results (from the past 240 hour(s))  Culture, blood (x 2)     Status: None (Preliminary result)   Collection Time: 07/21/17  9:25 PM  Result Value Ref Range Status   Specimen Description BLOOD RIGHT HAND  Final   Special Requests   Final    BOTTLES DRAWN AEROBIC ONLY Blood Culture results may not be optimal due to an inadequate volume of blood received in culture bottles   Culture   Final    NO GROWTH 2 DAYS Performed at [place] [address] [place], [place] [address]    Report Status PENDING  Incomplete  Culture, blood (x 2)     Status: None (Preliminary result)   Collection Time: 07/21/17  9:51 PM  Result Value Ref Range Status   Specimen Description BLOOD LEFT FOOT  Final   Special Requests   Final    BOTTLES DRAWN AEROBIC ONLY Blood Culture results may not be optimal due to an inadequate volume of blood received in culture bottles   Culture   Final    NO GROWTH 2 DAYS Performed at [facility], [place] [address] [place], [place] [address]     Report Status PENDING  Incomplete  MRSA PCR Screening     Status: None   Collection Time: 07/22/17  7:33 AM  Result Value Ref Range Status   MRSA by PCR NEGATIVE NEGATIVE Final    Comment:        The GeneXpert MRSA Assay (FDA approved for NASAL specimens only), is one component of a comprehensive MRSA colonization surveillance program. It is not intended to diagnose MRSA infection nor to guide or monitor treatment for MRSA infections. Performed at [facility], [place] [address], [place], [place] [address]          Radiology Studies: No results found.      Scheduled Meds: . bacitracin   Topical BID  . clindamycin  300 mg Oral Q8H  .  enoxaparin (LOVENOX) injection  40 mg Subcutaneous Q24H  . gabapentin  100 mg Oral TID  . insulin aspart  0-15 Units Subcutaneous TID WC  . insulin aspart  0-5 Units Subcutaneous QHS  . insulin aspart protamine- aspart  40 Units Subcutaneous BID WC  . isosorbide-hydrALAZINE  1 tablet Oral TID  . metoCLOPramide  5 mg Oral TID AC & HS  . nicotine  21 mg Transdermal Daily  . pantoprazole  40 mg Oral Q1200  . sucralfate  1 g Oral TID WC & HS   Continuous Infusions:   LOS: 3 days      Georgette Shell, MD [facility]  If 7PM-7AM, please contact night-coverage [email or web address] Password [email or web address] 07/24/2017, 9:05 AM

## 2017-07-25 DIAGNOSIS — E1110 Type 2 diabetes mellitus with ketoacidosis without coma: Secondary | ICD-10-CM

## 2017-07-25 LAB — GLUCOSE, CAPILLARY
GLUCOSE-CAPILLARY: 173 mg/dL — AB (ref 65–99)
GLUCOSE-CAPILLARY: 234 mg/dL — AB (ref 65–99)
Glucose-Capillary: 56 mg/dL — ABNORMAL LOW (ref 65–99)

## 2017-07-25 MED ORDER — GABAPENTIN 100 MG PO CAPS: 100.0000 mg | ORAL_CAPSULE | Freq: Three times a day (TID) | ORAL | 0 refills | Status: DC

## 2017-07-25 MED ORDER — BACITRACIN ZINC 500 UNIT/GM EX OINT: TOPICAL_OINTMENT | Freq: Two times a day (BID) | CUTANEOUS | 0 refills | Status: DC

## 2017-07-25 MED ORDER — ISOSORB DINITRATE-HYDRALAZINE 20-37.5 MG PO TABS: 1.0000 | ORAL_TABLET | Freq: Three times a day (TID) | ORAL | 0 refills | Status: DC

## 2017-07-25 MED ORDER — INSULIN ASPART 100 UNIT/ML ~~LOC~~ SOLN: 0.0000 [IU] | Freq: Three times a day (TID) | SUBCUTANEOUS | 11 refills | Status: DC

## 2017-07-25 MED ORDER — SUCRALFATE 1 GM/10ML PO SUSP: 1.0000 g | Freq: Three times a day (TID) | ORAL | 0 refills | Status: DC

## 2017-07-25 MED ORDER — NICOTINE 21 MG/24HR TD PT24: 21.0000 mg | MEDICATED_PATCH | Freq: Every day | TRANSDERMAL | 0 refills | Status: DC

## 2017-07-25 MED ORDER — INSULIN ASPART PROT & ASPART (70-30 MIX) 100 UNIT/ML ~~LOC~~ SUSP: 30.0000 [IU] | Freq: Two times a day (BID) | SUBCUTANEOUS | 11 refills | Status: DC

## 2017-07-25 NOTE — Progress Notes (Signed)
Results for RAMERE, DOWNS (MRN [id number]) as of 07/25/2017 13:53  Ref. Range 07/24/2017 17:10 07/24/2017 21:43 07/25/2017 06:18 07/25/2017 08:03 07/25/2017 11:29  Glucose-Capillary Latest Ref Range: 65 - 99 mg/dL 243 (H) 66 56 (L) 173 (H) 234 (H)  Noted that blood sugars have been less than 70 mg/dl last night.  Recommend decreasing 70/30 insulin dosage to 35 units BID. Will continue to monitor blood sugars while in the hospital.  Harvel Ricks RN BSN CDE Diabetes Coordinator Pager: [phone]  8am-5pm

## 2017-07-25 NOTE — Progress Notes (Signed)
IV in both right and left foot discontinued. Telemetry dc'd. CCMD notified. Pt tolerated well. Security guard at bedside

## 2017-07-25 NOTE — Progress Notes (Signed)
Patient off the monitor and in the bathroom at this time

## 2017-07-25 NOTE — Discharge Summary (Signed)
Physician Discharge Summary  Luke [name] IWP:[id number] DOB: April 04, 1980 DOA: 07/21/2017  PCP: Patient, No Pcp Per  Admit date: 07/21/2017 Discharge date: 07/25/2017  Admitted From: jail Disposition:jail Recommendations for Outpatient Follow-up:  1. Follow up with PCP in 1-2 weeks 2. Please obtain BMP/CBC in one week  Home Health none Equipment/Devices none Discharge Condition stable CODE STATUS full Diet recommendation carb modified Brief/Interim Summary:37yo malewith a history ofhypertension, diabetes mellitus, GERD, anemia, and polysubstance abuse (tobacco, cocaine, IV drug user) who presented with nausea, vomiting, abdominal pain, and multiple boils of the legs and arms.  Pt isin the custody of the [facility] Dept.He stated that prior to beingincarcerated he was taking 70/30 insulin twice a day and regular insulin sliding scale. For the 3 days preceding this admit while in jail he reported he was being treated only with regular insulin.  In the ED he was found to be in DKA.   Discharge Diagnoses:  Principal Problem:   DKA (diabetic ketoacidoses) ([place]) Active Problems:   Drug abuse, IV ([place])   Tobacco abuse   HTN (hypertension)   Skin abscess   Cocaine abuse ([place])   Sepsis ([place])   Type 2 diabetes mellitus with ketoacidosis without coma, with long-term current use of insulin ([name])   Acute renal failure superimposed on stage 2 chronic kidney disease ([name])   GERD (gastroesophageal reflux disease)   Abdominal pain  Uncontrolled type 2 dm with dka- resolved.  Patient initially treated with insulin drip and then transition to 70/30 insulin.  Patient will be discharged today on 70/30 insulin 30 units twice a day adjust the dose accordingly.  Did discuss with the Dr. at the jail who sees the patient.  Multiple cutaneous boils on clindamycin which was started on 07/19/2017 finish the course of 10 days.  Hypertension patient has been started on BiDil which will be  continued at this dose as needed.  Acute on chronic kidney disease has been resolved this was secondary to severe dehydration and DKA.  FOBT positive with anemia-hemoglobin stable will need work-up as an outpatient. Discharge Instructions  Discharge Instructions    Call MD for:  difficulty breathing, headache or visual disturbances   Complete by:  As directed    Call MD for:  persistant dizziness or light-headedness   Complete by:  As directed    Call MD for:  persistant nausea and vomiting   Complete by:  As directed    Call MD for:  severe uncontrolled pain   Complete by:  As directed    Diet - low sodium heart healthy   Complete by:  As directed    Increase activity slowly   Complete by:  As directed      Allergies as of 07/25/2017   No Known Allergies     Medication List    STOP taking these medications   insulin NPH-regular Human (70-30) 100 UNIT/ML injection Commonly known as:  NOVOLIN 70/30     TAKE these medications   acetaminophen 325 MG tablet Commonly known as:  TYLENOL Take 2 tablets (650 mg total) by mouth every 6 (six) hours as needed for mild pain (or Fever >/= 101).   bacitracin ointment Apply topically 2 (two) times daily.   clindamycin 150 MG capsule Commonly known as:  CLEOCIN Take 150 mg by mouth 3 (three) times daily.   gabapentin 100 MG capsule Commonly known as:  NEURONTIN Take 1 capsule (100 mg total) by mouth 3 (three) times daily. What changed:  additional instructions   glucose blood test strip Use as instructed   insulin aspart 100 UNIT/ML injection Commonly known as:  novoLOG Inject 0-15 Units into the skin 3 (three) times daily with meals.   insulin aspart protamine- aspart (70-30) 100 UNIT/ML injection Commonly known as:  NOVOLOG MIX 70/30 Inject 0.3 mLs (30 Units total) into the skin 2 (two) times daily with a meal.   Insulin Syringe-Needle U-100 31G X 5/16" 0.5 ML Misc Commonly known as:  TRUEPLUS INSULIN SYRINGE USE AS  DIRECTED.   isosorbide-hydrALAZINE 20-37.5 MG tablet Commonly known as:  BIDIL Take 1 tablet by mouth 3 (three) times daily.   loperamide 2 MG capsule Commonly known as:  IMODIUM Take 2 mg by mouth as needed for diarrhea or loose stools.   meclizine 25 MG tablet Commonly known as:  ANTIVERT Take 25 mg by mouth 3 (three) times daily as needed for dizziness or nausea.   nicotine 21 mg/24hr patch Commonly known as:  NICODERM CQ - dosed in mg/24 hours Place 1 patch (21 mg total) onto the skin daily. Start taking on:  07/26/2017   ranitidine 150 MG tablet Commonly known as:  ZANTAC Take 150 mg by mouth 2 (two) times daily.   sucralfate 1 GM/10ML suspension Commonly known as:  CARAFATE Take 10 mLs (1 g total) by mouth 4 (four) times daily -  with meals and at bedtime.   TRUE METRIX METER Devi 1 kit by Does not apply route as directed. Use as directed   TRUEPLUS LANCETS 28G Misc Use as directed       No Known Allergies  Consultations:  none   Procedures/Studies: Ct Abdomen Pelvis Wo Contrast  Result Date: 07/22/2017 CLINICAL DATA:  37 year old male with abdominal pain. Gastroenteritis versus colitis suspected. EXAM: CT ABDOMEN AND PELVIS WITHOUT CONTRAST TECHNIQUE: Multidetector CT imaging of the abdomen and pelvis was performed following the standard protocol without IV contrast. COMPARISON:  Abdominal CT dated 12/16/2016 FINDINGS: Evaluation of this exam is limited in the absence of intravenous contrast. Lower chest: Minimal bibasilar dependent atelectatic changes. The visualized lung bases are otherwise clear. No intra-abdominal free air or free fluid. Hepatobiliary: No focal liver abnormality is seen. No gallstones, gallbladder wall thickening, or biliary dilatation. Pancreas: Unremarkable. No pancreatic ductal dilatation or surrounding inflammatory changes. Spleen: Normal in size without focal abnormality. Adrenals/Urinary Tract: The adrenal glands are unremarkable.  Nonobstructing right renal calculi measure up to 5 mm in the upper pole of the right kidney. No hydronephrosis. There is faint high attenuation of the renal medulla bilaterally which may represent small stones. There is no hydronephrosis or obstructing stone on the left. The visualized ureters and urinary bladder appear unremarkable. Stomach/Bowel: There is loose stool within the colon. There is no bowel obstruction or active inflammation. The appendix is normal. Vascular/Lymphatic: The abdominal aorta and IVC appear unremarkable. No portal venous gas. There is no adenopathy. Reproductive: The prostate and seminal vesicles are grossly unremarkable. No pelvic mass. Other: None Musculoskeletal: Mild chronic lower thoracic compression deformities. No acute osseous pathology. IMPRESSION: 1. Diarrheal state. Correlation with clinical exam and stool cultures recommended. No bowel obstruction or active inflammation. Normal appendix. 2. Small nonobstructing renal calculi.  No hydronephrosis. Electronically Signed   By: Anner Crete M.D.   On: 07/22/2017 01:10    (Echo, Carotid, EGD, Colonoscopy, ERCP)    Subjective:   Discharge Exam: Vitals:   07/24/17 2031 07/25/17 0519  BP:    Pulse:  78  Resp:  Temp: 98.9 F (37.2 C) 98.5 F (36.9 C)  SpO2:  98%   Vitals:   07/24/17 0621 07/24/17 1254 07/24/17 2031 07/25/17 0519  BP: (!) 141/72 120/66    Pulse: 68 89  78  Resp: (!) 26 19    Temp: 98.3 F (36.8 C) 98.9 F (37.2 C) 98.9 F (37.2 C) 98.5 F (36.9 C)  TempSrc: Oral Oral Oral Oral  SpO2: 99% 98%  98%  Weight:      Height:        General: Pt is alert, awake, not in acute distress Cardiovascular: RRR, S1/S2 +, no rubs, no gallops Respiratory: CTA bilaterally, no wheezing, no rhonchi Abdominal: Soft, NT, ND, bowel sounds + Extremities: no edema, no cyanosis    The results of significant diagnostics from this hospitalization (including imaging, microbiology, ancillary and  laboratory) are listed below for reference.     Microbiology: Recent Results (from the past 240 hour(s))  Culture, blood (x 2)     Status: None (Preliminary result)   Collection Time: 07/21/17  9:25 PM  Result Value Ref Range Status   Specimen Description BLOOD RIGHT HAND  Final   Special Requests   Final    BOTTLES DRAWN AEROBIC ONLY Blood Culture results may not be optimal due to an inadequate volume of blood received in culture bottles   Culture   Final    NO GROWTH 3 DAYS Performed at [facility], [place] [address] [place], [place] [address]    Report Status PENDING  Incomplete  Culture, blood (x 2)     Status: None (Preliminary result)   Collection Time: 07/21/17  9:51 PM  Result Value Ref Range Status   Specimen Description BLOOD LEFT FOOT  Final   Special Requests   Final    BOTTLES DRAWN AEROBIC ONLY Blood Culture results may not be optimal due to an inadequate volume of blood received in culture bottles   Culture   Final    NO GROWTH 3 DAYS Performed at [facility], [place] [address] [place], [place] [address]    Report Status PENDING  Incomplete  MRSA PCR Screening     Status: None   Collection Time: 07/22/17  7:33 AM  Result Value Ref Range Status   MRSA by PCR NEGATIVE NEGATIVE Final    Comment:        The GeneXpert MRSA Assay (FDA approved for NASAL specimens only), is one component of a comprehensive MRSA colonization surveillance program. It is not intended to diagnose MRSA infection nor to guide or monitor treatment for MRSA infections. Performed at [facility], [place] [address] [place], [place] [address]      Labs: BNP (last 3 results) No results for input(s): BNP in the last 8760 hours. Basic Metabolic Panel: Recent Labs  Lab 07/22/17 0515 07/22/17 0720 07/22/17 1144 07/23/17 0328 07/24/17 0731  NA 138 138 139 137 135  K 3.2* 3.5 3.6 4.0 3.3*  CL 103 106 105 105 104  CO2 23 19* 22 20* 19*  GLUCOSE 149* 115* 168* 221* 114*   BUN 26* 25* 23* 17 12  CREATININE 1.44* 1.41* 1.31* 1.15 1.12  CALCIUM 8.1* 8.0* 8.3* 8.2* 8.2*   Liver Function Tests: Recent Labs  Lab 07/21/17 2151 07/23/17 0328  AST 18 18  ALT 18 12*  ALKPHOS 145* 109  BILITOT 1.7* 0.6  PROT 7.1 5.7*  ALBUMIN 3.0* 2.4*   Recent Labs  Lab 07/21/17 2151  LIPASE  34   No results for input(s): AMMONIA in the last 168 hours. CBC: Recent Labs  Lab 07/21/17 1740 07/23/17 0328 07/24/17 0731  WBC 23.2* 18.5* 16.5*  HGB 11.9* 9.5* 10.2*  HCT 35.7* 29.2* 31.3*  MCV 77.8* 78.3 77.5*  PLT 588* 484* 516*   Cardiac Enzymes: No results for input(s): CKTOTAL, CKMB, CKMBINDEX, TROPONINI in the last 168 hours. BNP: Invalid input(s): POCBNP CBG: Recent Labs  Lab 07/24/17 1104 07/24/17 1710 07/24/17 2143 07/25/17 0618 07/25/17 0803  GLUCAP 186* 243* 66 56* 173*   D-Dimer No results for input(s): DDIMER in the last 72 hours. Hgb A1c No results for input(s): HGBA1C in the last 72 hours. Lipid Profile No results for input(s): CHOL, HDL, LDLCALC, TRIG, CHOLHDL, LDLDIRECT in the last 72 hours. Thyroid function studies No results for input(s): TSH, T4TOTAL, T3FREE, THYROIDAB in the last 72 hours.  Invalid input(s): FREET3 Anemia work up Recent Labs    07/24/17 0731  VITAMINB12 513  FOLATE 10.2  FERRITIN 222  TIBC 214*  IRON 42*  RETICCTPCT 0.8   Urinalysis    Component Value Date/Time   COLORURINE YELLOW 07/21/2017 [place] 07/21/2017 1707   APPEARANCEUR Clear 04/11/2014 0138   LABSPEC 1.028 07/21/2017 1707   LABSPEC 1.026 04/11/2014 0138   PHURINE 5.0 07/21/2017 1707   GLUCOSEU >=500 (A) 07/21/2017 1707   GLUCOSEU >=500 04/11/2014 0138   HGBUR MODERATE (A) 07/21/2017 1707   BILIRUBINUR NEGATIVE 07/21/2017 1707   BILIRUBINUR neg 02/18/2017 0946   BILIRUBINUR Negative 04/11/2014 0138   KETONESUR 80 (A) 07/21/2017 1707   PROTEINUR >=300 (A) 07/21/2017 1707   UROBILINOGEN 0.2 02/18/2017 0946    UROBILINOGEN 0.2 12/10/2013 1221   NITRITE NEGATIVE 07/21/2017 1707   LEUKOCYTESUR NEGATIVE 07/21/2017 1707   LEUKOCYTESUR Negative 04/11/2014 0138   Sepsis Labs Invalid input(s): PROCALCITONIN,  WBC,  LACTICIDVEN Microbiology Recent Results (from the past 240 hour(s))  Culture, blood (x 2)     Status: None (Preliminary result)   Collection Time: 07/21/17  9:25 PM  Result Value Ref Range Status   Specimen Description BLOOD RIGHT HAND  Final   Special Requests   Final    BOTTLES DRAWN AEROBIC ONLY Blood Culture results may not be optimal due to an inadequate volume of blood received in culture bottles   Culture   Final    NO GROWTH 3 DAYS Performed at [facility], [place] [address] [place], [place] [address]    Report Status PENDING  Incomplete  Culture, blood (x 2)     Status: None (Preliminary result)   Collection Time: 07/21/17  9:51 PM  Result Value Ref Range Status   Specimen Description BLOOD LEFT FOOT  Final   Special Requests   Final    BOTTLES DRAWN AEROBIC ONLY Blood Culture results may not be optimal due to an inadequate volume of blood received in culture bottles   Culture   Final    NO GROWTH 3 DAYS Performed at [facility], [place] [address], [place], [place] [address]    Report Status PENDING  Incomplete  MRSA PCR Screening     Status: None   Collection Time: 07/22/17  7:33 AM  Result Value Ref Range Status   MRSA by PCR NEGATIVE NEGATIVE Final    Comment:        The GeneXpert MRSA Assay (FDA approved for NASAL specimens only), is one component of a comprehensive MRSA colonization surveillance program. It is not intended  to diagnose MRSA infection nor to guide or monitor treatment for MRSA infections. Performed at [facility], [place] [address], [place], [place] [address]      Time coordinating discharge: 27mnutes  SIGNED:   EGeorgette Shell MD  [facility] 07/25/2017, 9:55 AM Pager   If 7PM-7AM, please contact  night-coverage [email or web address] Password [email or web address]

## 2017-07-25 NOTE — Care Management Note (Signed)
Case Management Note Marvetta Gibbons RN, BSN Unit 4E-Case Manager [phone]  Patient Details  Name: Luke [name] MRN: [id number] Date of Birth: 12/01/1980  Subjective/Objective:   Pt admitted with DKA                 Action/Plan: PTA pt was at the [facility]- here in the custody of [facility] sheriff's dept. - received call from [place] at the [facility] jail- regarding pt's care- contact # is [phone]- pt will return to jail when medically stable.   Expected Discharge Date:  07/25/17               Expected Discharge Plan:  [place] Referral:  NA  Discharge planning Services  CM Consult  Post Acute Care Choice:  NA Choice offered to:  NA  DME Arranged:    DME Agency:     HH Arranged:    HH Agency:     Status of Service:  Completed, signed off  If discussed at [employer] of Stay Meetings, dates discussed:    Discharge Disposition: corrections facility   Additional Comments:  07/25/17- 1000- Marvetta Gibbons RN, CM- pt for d/c today back to corrections facility with [facility]- per Nira Conn pt will be able to get on going wound care at the jail and care for his DM.   Dawayne Patricia, RN 07/25/2017, 10:13 AM

## 2017-07-26 LAB — CULTURE, BLOOD (ROUTINE X 2)
CULTURE: NO GROWTH
Culture: NO GROWTH

## 2017-09-02 MED FILL — TRUEPLUS SYR 0.5ML 31GX5/16: 31G X 5/16" | 25 days supply | Qty: 100 | Fill #2

## 2017-09-02 MED FILL — $Humulin 70/30 vial: (70-30) 100 | 33 days supply | Qty: 30 | Fill #4

## 2017-09-30 ENCOUNTER — Ambulatory Visit: Payer: Self-pay | Attending: Nurse Practitioner | Admitting: Nurse Practitioner

## 2017-09-30 ENCOUNTER — Encounter: Payer: Self-pay | Admitting: Nurse Practitioner

## 2017-09-30 VITALS — BP 134/87 | HR 113 | Temp 98.8°F | Ht 69.0 in | Wt 193.0 lb

## 2017-09-30 DIAGNOSIS — E1165 Type 2 diabetes mellitus with hyperglycemia: Secondary | ICD-10-CM | POA: Insufficient documentation

## 2017-09-30 DIAGNOSIS — R000 Tachycardia, unspecified: Secondary | ICD-10-CM | POA: Insufficient documentation

## 2017-09-30 DIAGNOSIS — K219 Gastro-esophageal reflux disease without esophagitis: Secondary | ICD-10-CM | POA: Insufficient documentation

## 2017-09-30 DIAGNOSIS — Z794 Long term (current) use of insulin: Secondary | ICD-10-CM | POA: Insufficient documentation

## 2017-09-30 DIAGNOSIS — I10 Essential (primary) hypertension: Secondary | ICD-10-CM | POA: Insufficient documentation

## 2017-09-30 LAB — GLUCOSE, POCT (MANUAL RESULT ENTRY): POC Glucose: 93 mg/dl (ref 70–99)

## 2017-09-30 MED ORDER — GLUCOSE BLOOD VI STRP: ORAL_STRIP | 12 refills | Status: DC

## 2017-09-30 MED ORDER — GABAPENTIN 400 MG PO CAPS: 400.0000 mg | ORAL_CAPSULE | Freq: Three times a day (TID) | ORAL | 3 refills | Status: DC

## 2017-09-30 MED ORDER — LISINOPRIL 10 MG PO TABS: 10.0000 mg | ORAL_TABLET | Freq: Every day | ORAL | 3 refills | Status: DC

## 2017-09-30 MED FILL — GABAPENTIN 400 MG CAPSULE: 400 | 30 days supply | Qty: 90 | Fill #0

## 2017-09-30 MED FILL — TRUE METRIX TEST STRIP: 25 days supply | Qty: 100 | Fill #0

## 2017-09-30 NOTE — Progress Notes (Signed)
Assessment & Plan:  Luke [name] was seen today for establish care.  Diagnoses and all orders for this visit:  Uncontrolled type 2 diabetes mellitus with hyperglycemia ([name]) -     Glucose (CBG) -     gabapentin (NEURONTIN) 400 MG capsule; Take 1 capsule (400 mg total) by mouth 3 (three) times daily. -     glucose blood test strip; Use as instructed -     CBC -     Lipid panel -     Hemoglobin A1c Continue blood sugar control as discussed in office today, low carbohydrate diet, and regular physical exercise as tolerated, 150 minutes per week (30 min each day, 5 days per week, or 50 min 3 days per week). Keep blood sugar logs with fasting goal of 80-130 mg/dl, post prandial less than 180.  For Hypoglycemia: BS <60 and Hyperglycemia BS >400; contact the clinic ASAP. Annual eye exams and foot exams are recommended.   Tachycardia -     TSH  Essential hypertension -     CMP14+EGFR Lisinopril '10mg'$  daily Continue all antihypertensives as prescribed.  Remember to bring in your blood pressure log with you for your follow up appointment.  DASH/Mediterranean Diets are healthier choices for HTN.      Patient has been counseled on age-appropriate routine health concerns for screening and prevention. These are reviewed and up-to-date. Referrals have been placed accordingly. Immunizations are up-to-date or declined.    Subjective:   Chief Complaint  Patient presents with  . Establish Care    Pt. is here to establish care for diabetes.    HPI Luke [name] 37 y.o. male presents to office today to establish care. He has a history of poorly controlled DM Type 2 with lowest A1c around 10 since diagnosis. He does have a history of substance use disorder, GERD and HTN.  Diabetes Mellitus Type 2 Disease course has been worsening. There are no hypoglycemic symptoms. There are no hypoglycemic complications. Symptoms are stable. There are diabetic complications. Risk factors for coronary artery disease  include family history, dyslipidemia, diabetes mellitus, hypertension. Current diabetic treatment includes Novolog 70/30; 40 units during the day and 30 units at night. Novolog SSI 0-15 units TID. He reports sporadic use of Novolog.States he did not tolerate basal insulin well in the past.  Patient is compliant with treatment all of the time and monitors blood glucose at home 1-2 times per day.   Home blood glucose trend : (FBS mid 200s mg/dl) Post Prandial 140s  Weight is  stable. Patient does not follow a generally healthy diet.  Patient is not compliant with exercise.   An ACE inhibitor/angiotensin II receptor blocker is being taken. Patient does not see a podiatrist. Eye exam is not current.  Lab Results  Component Value Date   HGBA1C 13.1 05/15/2017    CHRONIC HYPERTENSION Disease Monitoring  Blood pressure range: Not at goal for DM BP Readings from Last 3 Encounters:  09/30/17 134/87  05/15/17 138/85  02/18/17 117/79  Chest pain: no   Dyspnea: no   Claudication: no  Medication compliance: yes  Medication Side Effects  Lightheadedness: no   Urinary frequency: no   Edema: no   Impotence: no  Preventitive Healthcare:  Exercise: no   Diet Pattern: salt not added to cooking  Salt Restriction:  no   Review of Systems  Constitutional: Negative for fever, malaise/fatigue and weight loss.  HENT: Negative.  Negative for nosebleeds.   Eyes: Negative.  Negative for  blurred vision, double vision and photophobia.  Respiratory: Negative.  Negative for cough and shortness of breath.   Cardiovascular: Negative.  Negative for chest pain, palpitations and leg swelling.  Gastrointestinal: Positive for heartburn. Negative for nausea and vomiting.  Musculoskeletal: Negative.  Negative for myalgias.  Neurological: Negative.  Negative for dizziness, focal weakness, seizures and headaches.  Psychiatric/Behavioral: Negative.  Negative for suicidal ideas.    Past Medical History:  Diagnosis Date    . Anemia   . Anxiety  Dx 2008  . Diabetes mellitus without complication ([place]) Dx 1610  . DKA (diabetic ketoacidoses) ([place]) 07/21/2017  . GERD (gastroesophageal reflux disease) Dx 2008  . Headache(784.0)   . Pneumonia   . Seizures ([place]) 2011    x 2 in lifetime. on Dilantin for a while.   . Substance abuse ([place]) 2013    heroin use, multiple relapses    Past Surgical History:  Procedure Laterality Date  . I&D EXTREMITY Left 10/11/2012   Procedure: IRRIGATION AND DEBRIDEMENT ABSCESS FOREARM;  Surgeon: Linna Hoff, MD;  Location: [place];  Service: Orthopedics;  Laterality: Left;  . I&D EXTREMITY Left 10/12/2012   Procedure: IRRIGATION AND DEBRIDEMENT FOREARM;  Surgeon: Linna Hoff, MD;  Location: [place];  Service: Orthopedics;  Laterality: Left;  . I&D EXTREMITY Left 10/14/2012   Procedure: incision and drainage left forearm;  Surgeon: Linna Hoff, MD;  Location: [place];  Service: Orthopedics;  Laterality: Left;  . I&D EXTREMITY Left 10/16/2012   Procedure: IRRIGATION AND DEBRIDEMENT LEFT FOREARM;  Surgeon: Linna Hoff, MD;  Location: [place];  Service: Orthopedics;  Laterality: Left;  . I&D EXTREMITY Left 10/20/2012   Procedure: INCISION AND DRAINAGE AND DEBRIDEMENT LEFT  FOREARM;  Surgeon: Linna Hoff, MD;  Location: [place];  Service: Orthopedics;  Laterality: Left;  . IRRIGATION AND DEBRIDEMENT ABSCESS     Hx: of left arm abscess related to drug use     Family History  Problem Relation Age of Onset  . Diabetes Father   . Heart disease Father   . Mental illness Sister   . Cancer Neg Hx     Social History Reviewed with no changes to be made today.   Outpatient Medications Prior to Visit  Medication Sig Dispense Refill  . acetaminophen (TYLENOL) 325 MG tablet Take 2 tablets (650 mg total) by mouth every 6 (six) hours as needed for mild pain (or Fever >/= 101).    . bacitracin ointment Apply topically 2 (two) times daily. 120 g 0  . Blood Glucose Monitoring Suppl (TRUE  METRIX METER) DEVI 1 kit by Does not apply route as directed. Use as directed 1 Device 0  . insulin aspart (NOVOLOG) 100 UNIT/ML injection Inject 0-15 Units into the skin 3 (three) times daily with meals. 10 mL 11  . insulin aspart protamine- aspart (NOVOLOG MIX 70/30) (70-30) 100 UNIT/ML injection Inject 0.3 mLs (30 Units total) into the skin 2 (two) times daily with a meal. 10 mL 11  . Insulin Syringe-Needle U-100 (TRUEPLUS INSULIN SYRINGE) 31G X 5/16" 0.5 ML MISC USE AS DIRECTED. 100 each 6  . loperamide (IMODIUM) 2 MG capsule Take 2 mg by mouth as needed for diarrhea or loose stools.    . ranitidine (ZANTAC) 150 MG tablet Take 150 mg by mouth 2 (two) times daily.    . TRUEPLUS LANCETS 28G MISC Use as directed 100 each 12  . gabapentin (NEURONTIN) 100 MG capsule Take 1 capsule (100 mg total)  by mouth 3 (three) times daily. 60 capsule 0  . glucose blood test strip Use as instructed 100 each 12  . meclizine (ANTIVERT) 25 MG tablet Take 25 mg by mouth 3 (three) times daily as needed for dizziness or nausea.    . isosorbide-hydrALAZINE (BIDIL) 20-37.5 MG tablet Take 1 tablet by mouth 3 (three) times daily. (Patient not taking: Reported on 09/30/2017) 60 tablet 0  . nicotine (NICODERM CQ - DOSED IN MG/24 HOURS) 21 mg/24hr patch Place 1 patch (21 mg total) onto the skin daily. (Patient not taking: Reported on 09/30/2017) 28 patch 0  . sucralfate (CARAFATE) 1 GM/10ML suspension Take 10 mLs (1 g total) by mouth 4 (four) times daily -  with meals and at bedtime. (Patient not taking: Reported on 09/30/2017) 420 mL 0   No facility-administered medications prior to visit.     No Known Allergies     Objective:    BP 134/87 (BP Location: Left Arm, Patient Position: Sitting, Cuff Size: Normal)   Pulse (!) 113   Temp 98.8 F (37.1 C) (Oral)   Ht '5\' 9"'$  (1.753 m)   Wt 193 lb (87.5 kg)   SpO2 93%   BMI 28.50 kg/m  Wt Readings from Last 3 Encounters:  09/30/17 193 lb (87.5 kg)  05/15/17 200 lb 12.8 oz  (91.1 kg)  02/18/17 193 lb 12.8 oz (87.9 kg)    Physical Exam  Constitutional: He is oriented to person, place, and time. He appears well-developed and well-nourished. He is cooperative.  HENT:  Head: Normocephalic and atraumatic.  Eyes: EOM are normal.  Neck: Normal range of motion.  Cardiovascular: Regular rhythm, normal heart sounds and intact distal pulses. Tachycardia present. Exam reveals no gallop and no friction rub.  No murmur heard. Pulmonary/Chest: Effort normal and breath sounds normal. No tachypnea. No respiratory distress. He has no decreased breath sounds. He has no wheezes. He has no rhonchi. He has no rales. He exhibits no tenderness.  Abdominal: Bowel sounds are normal.  Musculoskeletal: Normal range of motion. He exhibits no edema.  Neurological: He is alert and oriented to person, place, and time. Coordination normal.  Skin: Skin is warm and dry.  Psychiatric: He has a normal mood and affect. His behavior is normal. Judgment and thought content normal.  Nursing note and vitals reviewed.       Patient has been counseled extensively about nutrition and exercise as well as the importance of adherence with medications and regular follow-up. The patient was given clear instructions to go to ER or return to medical center if symptoms don't improve, worsen or new problems develop. The patient verbalized understanding.   Follow-up: Return in about 3 months (around 12/31/2017) for HTN/DM.   Gildardo Pounds, FNP-BC [facility] and [place], [place]   09/30/2017, 6:29 PM

## 2017-09-30 NOTE — Patient Instructions (Signed)
Diabetes blood sugar goals  Fasting in AM before breakfast which means at least 8 hrs of no eating or drinking) except water or unsweetened coffee or tea): 90-130 2 hrs after meals: < 180,   Hypoglycemia or low blood sugar: < 70 (You should not have hypoglycemia.)  Aim for 30 minutes of exercise most days. Rethink what you drink. Water is great! Aim for 2-3 Carb Choices per meal (30-45 grams) +/- 1 either way  Aim for 0-15 Carbs per snack if hungry  Include protein in moderation with your meals and snacks  Consider reading food labels for Total Carbohydrate and Fat Grams of foods  Consider checking BG at alternate times per day  Continue taking medication as directed Be mindful about how much sugar you are adding to beverages and other foods. Fruit Punch - find one with no sugar  Measure and decrease portions of carbohydrate foods  Make your plate and don't go back for seconds

## 2017-10-01 MED FILL — LISINOPRIL 10 MG TABS: 10 | 30 days supply | Qty: 30 | Fill #1

## 2017-10-02 ENCOUNTER — Telehealth: Payer: Self-pay | Admitting: Nurse Practitioner

## 2017-10-02 NOTE — Telephone Encounter (Signed)
Pt called to request any medication or treatment sent to  [facility] to treat his cellulitis, he is aware he did not speak about this with his new PCP but wants to know if she is willing to prescribe him something based on his visit 05/15/2017.  please follow up

## 2017-10-02 NOTE — Telephone Encounter (Signed)
Will route to PCP 

## 2017-10-06 NOTE — Telephone Encounter (Signed)
Needs office visit.

## 2017-10-07 NOTE — Telephone Encounter (Signed)
CMA attempt to call patient to inform he need to make an Office Visit. No answer and left VM for patient to call back.

## 2017-10-21 ENCOUNTER — Ambulatory Visit: Payer: Self-pay | Attending: Nurse Practitioner | Admitting: Nurse Practitioner

## 2017-10-21 ENCOUNTER — Ambulatory Visit (HOSPITAL_COMMUNITY)
Admission: RE | Admit: 2017-10-21 | Discharge: 2017-10-21 | Disposition: A | Discharge status: Home / Self Care | Payer: Self-pay | Source: Ambulatory Visit | Attending: Nurse Practitioner | Admitting: Nurse Practitioner

## 2017-10-21 ENCOUNTER — Encounter: Payer: Self-pay | Admitting: Nurse Practitioner

## 2017-10-21 VITALS — BP 140/89 | HR 116 | Temp 98.9°F | Ht 69.0 in | Wt 188.4 lb

## 2017-10-21 DIAGNOSIS — B9689 Other specified bacterial agents as the cause of diseases classified elsewhere: Secondary | ICD-10-CM | POA: Insufficient documentation

## 2017-10-21 DIAGNOSIS — F419 Anxiety disorder, unspecified: Secondary | ICD-10-CM | POA: Insufficient documentation

## 2017-10-21 DIAGNOSIS — M7989 Other specified soft tissue disorders: Secondary | ICD-10-CM | POA: Insufficient documentation

## 2017-10-21 DIAGNOSIS — L03116 Cellulitis of left lower limb: Secondary | ICD-10-CM | POA: Insufficient documentation

## 2017-10-21 DIAGNOSIS — Z794 Long term (current) use of insulin: Secondary | ICD-10-CM | POA: Insufficient documentation

## 2017-10-21 DIAGNOSIS — K219 Gastro-esophageal reflux disease without esophagitis: Secondary | ICD-10-CM | POA: Insufficient documentation

## 2017-10-21 DIAGNOSIS — E1165 Type 2 diabetes mellitus with hyperglycemia: Secondary | ICD-10-CM | POA: Insufficient documentation

## 2017-10-21 DIAGNOSIS — Z79899 Other long term (current) drug therapy: Secondary | ICD-10-CM | POA: Insufficient documentation

## 2017-10-21 DIAGNOSIS — L02416 Cutaneous abscess of left lower limb: Secondary | ICD-10-CM | POA: Insufficient documentation

## 2017-10-21 DIAGNOSIS — Z833 Family history of diabetes mellitus: Secondary | ICD-10-CM | POA: Insufficient documentation

## 2017-10-21 LAB — COMPREHENSIVE METABOLIC PANEL
ALBUMIN: 3.8 g/dL (ref 3.5–5.0)
ALT: 21 U/L (ref 0–44)
ANION GAP: 13 (ref 5–15)
AST: 23 U/L (ref 15–41)
Alkaline Phosphatase: 161 U/L — ABNORMAL HIGH (ref 38–126)
BILIRUBIN TOTAL: 0.8 mg/dL (ref 0.3–1.2)
BUN: 22 mg/dL — AB (ref 6–20)
CHLORIDE: 100 mmol/L (ref 98–111)
CO2: 24 mmol/L (ref 22–32)
Calcium: 9.5 mg/dL (ref 8.9–10.3)
Creatinine, Ser: 1.86 mg/dL — ABNORMAL HIGH (ref 0.61–1.24)
GFR calc Af Amer: 52 mL/min — ABNORMAL LOW (ref >60)
GFR calc non Af Amer: 45 mL/min — ABNORMAL LOW (ref >60)
GLUCOSE: 253 mg/dL — AB (ref 70–99)
POTASSIUM: 4.1 mmol/L (ref 3.5–5.1)
Sodium: 137 mmol/L (ref 135–145)
TOTAL PROTEIN: 7.6 g/dL (ref 6.5–8.1)

## 2017-10-21 LAB — POCT GLYCOSYLATED HEMOGLOBIN (HGB A1C): HEMOGLOBIN A1C: 10.4 % — AB (ref 4.0–5.6)

## 2017-10-21 LAB — CBC
HCT: 44 % (ref 39.0–52.0)
Hemoglobin: 14.2 g/dL (ref 13.0–17.0)
MCH: 27.1 pg (ref 26.0–34.0)
MCHC: 32.3 g/dL (ref 30.0–36.0)
MCV: 84 fL (ref 78.0–100.0)
PLATELETS: 489 10*3/uL — AB (ref 150–400)
RBC: 5.24 MIL/uL (ref 4.22–5.81)
RDW: 14.5 % (ref 11.5–15.5)
WBC: 18.5 10*3/uL — AB (ref 4.0–10.5)

## 2017-10-21 LAB — LIPID PANEL
CHOL/HDL RATIO: 6.6 ratio
CHOLESTEROL: 237 mg/dL — AB (ref 0–200)
HDL: 36 mg/dL — AB (ref >40)
LDL Cholesterol: 156 mg/dL — ABNORMAL HIGH (ref 0–99)
Triglycerides: 224 mg/dL — ABNORMAL HIGH (ref <150)
VLDL: 45 mg/dL — AB (ref 0–40)

## 2017-10-21 LAB — TSH: TSH: 6.831 u[IU]/mL — AB (ref 0.350–4.500)

## 2017-10-21 LAB — GLUCOSE, POCT (MANUAL RESULT ENTRY): POC GLUCOSE: 233 mg/dL — AB (ref 70–99)

## 2017-10-21 MED ORDER — TRAMADOL HCL 50 MG PO TABS: 50.0000 mg | ORAL_TABLET | Freq: Three times a day (TID) | ORAL | 0 refills | Status: AC | PRN

## 2017-10-21 MED ORDER — SULFAMETHOXAZOLE-TRIMETHOPRIM 800-160 MG PO TABS: 1.0000 | ORAL_TABLET | Freq: Two times a day (BID) | ORAL | 0 refills | Status: AC

## 2017-10-21 MED FILL — SULFAMETHOXAZOLE-TMP DS TAB: 800-160 | 10 days supply | Qty: 20 | Fill #0

## 2017-10-21 MED FILL — traMADol HCL 50 MG TABS: 50 | 3 days supply | Qty: 7 | Fill #0

## 2017-10-21 NOTE — Progress Notes (Signed)
Assessment & Plan:  Luke [name] was seen today for cellulitis.  Diagnoses and all orders for this visit:  Cellulitis and abscess of left leg -     DG Ankle Complete Left; Future -     sulfamethoxazole-trimethoprim (BACTRIM DS,SEPTRA DS) 800-160 MG tablet; Take 1 tablet by mouth 2 (two) times daily for 10 days. -     traMADol (ULTRAM) 50 MG tablet; Take 1 tablet (50 mg total) by mouth every 8 (eight) hours as needed for up to 3 days. -     DG Tibia/Fibula Left; Future  Uncontrolled type 2 diabetes mellitus with hyperglycemia ([name]) -     Glucose (CBG) -     HgB A1c -     CBC -     CMP14+EGFR -     Lipid panel -     TSH    Patient has been counseled on age-appropriate routine health concerns for screening and prevention. These are reviewed and up-to-date. Referrals have been placed accordingly. Immunizations are up-to-date or declined.    Subjective:   Chief Complaint  Patient presents with  . Cellulitis    Pt. is here for his left leg cellulitis. Pt. stated it is giving him pain.   ' Luke [name] 37 y.o. male presents to office today with complaints of left leg pain and swelling. He denies any injury or trauma. He states over the past few weeks he noticed a discolored area on the medial side of his left lower leg. The area progressively enlarged and he began to experience severe pain and swelling around the discoloration.  He has a history of poorly controlled Type 2 DM.He has his meter with him today. Readings are as follows:  7 day average 440 14 day average 439  30 day average 363 He endorses increased blood glucose readings since he began experiencing pain in his leg. He denies any purulent drainage from the area or any insect bites.  Lab Results  Component Value Date   HGBA1C 10.4 (A) 10/21/2017   Lab Results  Component Value Date   HGBA1C 13.1 05/15/2017      HPI   Review of Systems  Constitutional: Negative for fever, malaise/fatigue and weight loss.  HENT:  Negative.  Negative for nosebleeds.   Eyes: Negative.  Negative for blurred vision, double vision and photophobia.  Respiratory: Negative.  Negative for cough and shortness of breath.   Cardiovascular: Positive for leg swelling. Negative for chest pain and palpitations.  Gastrointestinal: Negative.  Negative for heartburn, nausea and vomiting.  Musculoskeletal: Negative.  Negative for myalgias.  Skin: Positive for rash.  Neurological: Negative.  Negative for dizziness, focal weakness, seizures and headaches.  Psychiatric/Behavioral: Positive for substance abuse. Negative for suicidal ideas.    Past Medical History:  Diagnosis Date  . Anemia   . Anxiety  Dx 2008  . Diabetes mellitus without complication ([place]) Dx 1594  . DKA (diabetic ketoacidoses) ([place]) 07/21/2017  . GERD (gastroesophageal reflux disease) Dx 2008  . Headache(784.0)   . Pneumonia   . Seizures ([place]) 2011    x 2 in lifetime. on Dilantin for a while.   . 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 I&D EXTREMITY Left 10/11/2012   Procedure: IRRIGATION AND DEBRIDEMENT ABSCESS FOREARM;  Surgeon: Linna Hoff, MD;  Location: [facility];  Service: Orthopedics;  Laterality: Left;  . I&D EXTREMITY Left 10/12/2012  Procedure: IRRIGATION AND DEBRIDEMENT FOREARM;  Surgeon: Linna Hoff, MD;  Location: [facility];  Service: Orthopedics;  Laterality: Left;  . I&D EXTREMITY Left 10/14/2012   Procedure: incision and drainage left forearm;  Surgeon: Linna Hoff, MD;  Location: [facility];  Service: Orthopedics;  Laterality: Left;  . I&D EXTREMITY Left 10/16/2012   Procedure: IRRIGATION AND DEBRIDEMENT LEFT FOREARM;  Surgeon: Linna Hoff, MD;  Location: [facility];  Service: Orthopedics;  Laterality: Left;  . I&D EXTREMITY Left 10/20/2012   Procedure: INCISION AND DRAINAGE AND DEBRIDEMENT LEFT  FOREARM;  Surgeon: Linna Hoff, MD;  Location: [facility];  Service: Orthopedics;   Laterality: Left;  . IRRIGATION AND DEBRIDEMENT ABSCESS     Hx: of left arm abscess related to drug use     Family History  Problem Relation Age of Onset  . Diabetes Father   . Heart disease Father   . Mental illness Sister   . Cancer Neg Hx     Social History Reviewed with no changes to be made today.   Outpatient Medications Prior to Visit  Medication Sig Dispense Refill  . acetaminophen (TYLENOL) 325 MG tablet Take 2 tablets (650 mg total) by mouth every 6 (six) hours as needed for mild pain (or Fever >/= 101).    . Blood Glucose Monitoring Suppl (TRUE METRIX METER) DEVI 1 kit by Does not apply route as directed. Use as directed 1 Device 0  . gabapentin (NEURONTIN) 400 MG capsule Take 1 capsule (400 mg total) by mouth 3 (three) times daily. 90 capsule 3  . glucose blood test strip Use as instructed 100 each 12  . insulin aspart (NOVOLOG) 100 UNIT/ML injection Inject 0-15 Units into the skin 3 (three) times daily with meals. 10 mL 11  . insulin aspart protamine- aspart (NOVOLOG MIX 70/30) (70-30) 100 UNIT/ML injection Inject 0.3 mLs (30 Units total) into the skin 2 (two) times daily with a meal. (Patient taking differently: Inject into the skin 2 (two) times daily with a meal. ) 10 mL 11  . Insulin Syringe-Needle U-100 (TRUEPLUS INSULIN SYRINGE) 31G X 5/16" 0.5 ML MISC USE AS DIRECTED. 100 each 6  . lisinopril (PRINIVIL,ZESTRIL) 10 MG tablet Take 1 tablet (10 mg total) by mouth daily. 90 tablet 3  . TRUEPLUS LANCETS 28G MISC Use as directed 100 each 12  . bacitracin ointment Apply topically 2 (two) times daily. (Patient not taking: Reported on 10/21/2017) 120 g 0  . loperamide (IMODIUM) 2 MG capsule Take 2 mg by mouth as needed for diarrhea or loose stools.    . meclizine (ANTIVERT) 25 MG tablet Take 25 mg by mouth 3 (three) times daily as needed for dizziness or nausea.    . ranitidine (ZANTAC) 150 MG tablet Take 150 mg by mouth 2 (two) times daily.     No facility-administered  medications prior to visit.     No Known Allergies     Objective:    BP 140/89 (BP Location: Left Arm, Patient Position: Sitting, Cuff Size: Normal)   Pulse (!) 116   Temp 98.9 F (37.2 C) (Oral)   Ht _0  (1.753 m)   Wt 188 lb 6.4 oz (85.5 kg)   SpO2 98%   BMI 27.82 kg/m  Wt Readings from Last 3 Encounters:  10/21/17 188 lb 6.4 oz (85.5 kg)  09/30/17 193 lb (87.5 kg)  07/22/17 185 lb 13.6 oz (84.3 kg)    Physical Exam  Constitutional: He is oriented to  person, place, and time. He appears well-developed and well-nourished. He is cooperative.  HENT:  Head: Normocephalic and atraumatic.  Eyes: EOM are normal.  Neck: Normal range of motion.  Cardiovascular: Regular rhythm, normal heart sounds and intact distal pulses. Tachycardia present. Exam reveals no gallop and no friction rub.  No murmur heard. Pulmonary/Chest: Effort normal and breath sounds normal. No tachypnea. No respiratory distress. He has no decreased breath sounds. He has no wheezes. He has no rhonchi. He has no rales. He exhibits no tenderness.  Abdominal: Soft. Bowel sounds are normal.  Musculoskeletal: He exhibits no edema.       Left ankle: He exhibits decreased range of motion, swelling and deformity. Tenderness. Medial malleolus tenderness found.       Left lower leg: He exhibits tenderness and swelling.       Feet:  Neurological: He is alert and oriented to person, place, and time. Coordination normal.  Skin: Skin is warm and dry.  Psychiatric: He has a normal mood and affect. His behavior is normal. Judgment and thought content normal.  Nursing note and vitals reviewed.      Patient has been counseled extensively about nutrition and exercise as well as the importance of adherence with medications and regular follow-up. The patient was given clear instructions to go to ER or return to medical center if symptoms don't improve, worsen or new problems develop. The patient verbalized understanding.    Follow-up: Return in about 3 months (around 01/21/2018), or if symptoms worsen or fail to improve, for DM.   Gildardo Pounds, FNP-BC [facility] and [facility], [place]   10/21/2017, 12:50 PM

## 2017-10-22 NOTE — Telephone Encounter (Signed)
CMA spoke to patient to inform on Xray results and PCP advising.  Pt. Understood.

## 2017-10-22 NOTE — Telephone Encounter (Signed)
-----   Message from Gildardo Pounds, NP sent at 10/21/2017  5:34 PM EDT ----- Xray negative for bone infection. Continue current antibiotics until complete

## 2017-10-30 ENCOUNTER — Other Ambulatory Visit: Payer: Self-pay | Admitting: Nurse Practitioner

## 2017-10-30 DIAGNOSIS — L03116 Cellulitis of left lower limb: Principal | ICD-10-CM

## 2017-10-30 DIAGNOSIS — L02416 Cutaneous abscess of left lower limb: Secondary | ICD-10-CM

## 2017-10-30 MED FILL — LISINOPRIL 10 MG TABS: 10 | 30 days supply | Qty: 30 | Fill #2

## 2017-10-30 MED FILL — GABAPENTIN 400 MG CAPSULE: 400 | 30 days supply | Qty: 90 | Fill #1

## 2017-10-30 NOTE — Telephone Encounter (Signed)
Called the patient and informed him I also offered a appointment for tomorrow and he didn't want it.

## 2017-10-31 ENCOUNTER — Other Ambulatory Visit: Payer: Self-pay | Admitting: Nurse Practitioner

## 2017-10-31 ENCOUNTER — Telehealth: Payer: Self-pay

## 2017-10-31 ENCOUNTER — Telehealth: Payer: Self-pay | Admitting: Nurse Practitioner

## 2017-10-31 MED ORDER — LEVOTHYROXINE SODIUM 50 MCG PO TABS: 50.0000 ug | ORAL_TABLET | Freq: Every day | ORAL | 3 refills | Status: DC

## 2017-10-31 MED FILL — LEVOTHYROXINE 50 MCG TABLET: 50 | 30 days supply | Qty: 30 | Fill #0

## 2017-10-31 NOTE — Telephone Encounter (Signed)
Yesterday pt was told his refill on the antibiotic was denied, if his condition changed or wass worse he was advised to make an appointment or be seen @ urgent care/ER if necessary. [name] spoke w/ patient and offered an appointment for today and they pt declined. We will not refill antibiotics unless he is seen again and his condition is re-evaluated.

## 2017-10-31 NOTE — Telephone Encounter (Deleted)
-----   Message from Gildardo Pounds, NP sent at 10/31/2017  2:46 AM EDT ----- Please disregard note regarding antibiotics. Patient is currently taking antibiotics. However thyroid medication has been sent.

## 2017-10-31 NOTE — Telephone Encounter (Signed)
-----   Message from Gildardo Pounds, NP sent at 10/31/2017  2:39 AM EDT ----- Thyroid levels are elevated as well as cholesterol levels and WBC. Will need to start you on a medication for your thyroid and also send in an antibiotic for your ankle. Please follow up for repeat labwork in 4 weeks.

## 2017-10-31 NOTE — Telephone Encounter (Signed)
CMA spoke to patient to inform on lab results.  Patient scheduled a repeat blood work on 11/28/2017.  Patient stated he finished his Antibiotic that was given for his ankle and would like to know if he need more.

## 2017-10-31 NOTE — Telephone Encounter (Signed)
Pt called to request a medication refill on -sulfamethoxazole-trimethoprim (BACTRIM DS,SEPTRA DS) 800-160 MG tablet  To  -[facility] please follow up

## 2017-10-31 NOTE — Telephone Encounter (Signed)
-----   Message from Gildardo Pounds, NP sent at 10/31/2017  2:46 AM EDT ----- Please disregard note regarding antibiotics. Patient is currently taking antibiotics. However thyroid medication has been sent.

## 2017-11-07 MED FILL — $Humulin 70/30 vial: (70-30) 100 | 33 days supply | Qty: 30 | Fill #5

## 2017-11-08 ENCOUNTER — Other Ambulatory Visit: Payer: Self-pay | Admitting: Nurse Practitioner

## 2017-11-08 DIAGNOSIS — R7989 Other specified abnormal findings of blood chemistry: Secondary | ICD-10-CM

## 2017-11-20 ENCOUNTER — Ambulatory Visit: Payer: Self-pay | Attending: Nurse Practitioner | Admitting: Nurse Practitioner

## 2017-11-20 ENCOUNTER — Encounter: Payer: Self-pay | Admitting: Nurse Practitioner

## 2017-11-20 VITALS — BP 136/85 | HR 110 | Temp 100.5°F | Ht 69.0 in | Wt 191.4 lb

## 2017-11-20 DIAGNOSIS — G4709 Other insomnia: Secondary | ICD-10-CM

## 2017-11-20 DIAGNOSIS — Z794 Long term (current) use of insulin: Secondary | ICD-10-CM | POA: Insufficient documentation

## 2017-11-20 DIAGNOSIS — K219 Gastro-esophageal reflux disease without esophagitis: Secondary | ICD-10-CM | POA: Insufficient documentation

## 2017-11-20 DIAGNOSIS — L089 Local infection of the skin and subcutaneous tissue, unspecified: Secondary | ICD-10-CM | POA: Insufficient documentation

## 2017-11-20 DIAGNOSIS — F419 Anxiety disorder, unspecified: Secondary | ICD-10-CM | POA: Insufficient documentation

## 2017-11-20 DIAGNOSIS — Z79899 Other long term (current) drug therapy: Secondary | ICD-10-CM | POA: Insufficient documentation

## 2017-11-20 DIAGNOSIS — E1165 Type 2 diabetes mellitus with hyperglycemia: Secondary | ICD-10-CM | POA: Insufficient documentation

## 2017-11-20 DIAGNOSIS — L02619 Cutaneous abscess of unspecified foot: Secondary | ICD-10-CM | POA: Insufficient documentation

## 2017-11-20 LAB — GLUCOSE, POCT (MANUAL RESULT ENTRY): POC GLUCOSE: 181 mg/dL — AB (ref 70–99)

## 2017-11-20 MED ORDER — TRAZODONE HCL 50 MG PO TABS: 50.0000 mg | ORAL_TABLET | Freq: Every day | ORAL | 3 refills | Status: DC

## 2017-11-20 NOTE — Progress Notes (Addendum)
Assessment & Plan:  Banks was seen today for wound check.  Diagnoses and all orders for this visit:  Skin infection Instructed to go to the Emergency room for further evaluation and treatment including possible IV abx.   Type 2 diabetes mellitus with hyperglycemia, with long-term current use of insulin ([name]) -     Glucose (CBG) Poorly controlled. Continue blood sugar control as discussed in office today, low carbohydrate diet, and regular physical exercise as tolerated, 150 minutes per week (30 min each day, 5 days per week, or 50 min 3 days per week). Keep blood sugar logs with fasting goal of 90-130 mg/dl, post prandial (after you eat) less than 180.  For Hypoglycemia: BS <60 and Hyperglycemia BS >400; contact the clinic ASAP. Annual eye exams and foot exams are recommended.    Patient has been counseled on age-appropriate routine health concerns for screening and prevention. These are reviewed and up-to-date. Referrals have been placed accordingly. Immunizations are up-to-date or declined.    Subjective:   Chief Complaint  Patient presents with  . Wound Check    Pt. stated he had a relapse and he think he have a infection on his arms, legs, and feet.    HPI KAMERYN TISDEL 37 y.o. male presents to office today for evaluation of skin infection. 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 He endorses injections heroin into various areas of his body including bilateral arms and his left foot. The areas that he has been injecting are also areas where previous infected skin lesions had healed. The most serious affected area is the left foot. It is erythematous, swollen, painful to touch and has 3 abscessed areas on the dorsum of the foot. He is tachycardic and has a fever today. Due to possibility of sepsis or osteomyelitis I have instructed him that I can not honer his request of PO antibiotics and he needs to go to the hospital. He states "I can't go. Can't you just  give me something to take?". I discussed his condition with another provider here in the office and after a second examination it was determined that he will not be able to be treated here in the office and needs to be seen urgently in the ED. This was discussed with him several times prior to him leaving the office.   Review of Systems  Constitutional: Positive for fever. Negative for malaise/fatigue and weight loss.  HENT: Negative.  Negative for nosebleeds.   Eyes: Negative.  Negative for blurred vision, double vision and photophobia.  Respiratory: Negative.  Negative for cough and shortness of breath.   Cardiovascular: Positive for leg swelling. Negative for chest pain and palpitations.  Gastrointestinal: Negative.  Negative for heartburn, nausea and vomiting.  Musculoskeletal: Negative.  Negative for myalgias.  Skin: Positive for rash.  Neurological: Negative.  Negative for dizziness, focal weakness, seizures and headaches.  Psychiatric/Behavioral: Positive for substance abuse. Negative for suicidal ideas. The patient is nervous/anxious.     Past Medical History:  Diagnosis Date  . Anemia   . Anxiety  Dx 2008  . Diabetes mellitus without complication ([place]) Dx 6834  . DKA (diabetic ketoacidoses) ([place]) 07/21/2017  . GERD (gastroesophageal reflux disease) Dx 2008  . Headache(784.0)   . Pneumonia   . Seizures ([place]) 2011    x 2 in lifetime. on Dilantin for a while.   . 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 I&D EXTREMITY Left 10/11/2012   Procedure: IRRIGATION AND DEBRIDEMENT ABSCESS FOREARM;  Surgeon: Linna Hoff, MD;  Location: [facility];  Service: Orthopedics;  Laterality: Left;  . I&D EXTREMITY Left 10/12/2012   Procedure: IRRIGATION AND DEBRIDEMENT FOREARM;  Surgeon: Linna Hoff, MD;  Location: [facility];  Service: Orthopedics;  Laterality: Left;  . I&D EXTREMITY Left 10/14/2012   Procedure: incision and  drainage left forearm;  Surgeon: Linna Hoff, MD;  Location: [facility];  Service: Orthopedics;  Laterality: Left;  . I&D EXTREMITY Left 10/16/2012   Procedure: IRRIGATION AND DEBRIDEMENT LEFT FOREARM;  Surgeon: Linna Hoff, MD;  Location: [facility];  Service: Orthopedics;  Laterality: Left;  . I&D EXTREMITY Left 10/20/2012   Procedure: INCISION AND DRAINAGE AND DEBRIDEMENT LEFT  FOREARM;  Surgeon: Linna Hoff, MD;  Location: [facility];  Service: Orthopedics;  Laterality: Left;  . IRRIGATION AND DEBRIDEMENT ABSCESS     Hx: of left arm abscess related to drug use     Family History  Problem Relation Age of Onset  . Diabetes Father   . Heart disease Father   . Mental illness Sister   . Cancer Neg Hx     Social History Reviewed with no changes to be made today.   Outpatient Medications Prior to Visit  Medication Sig Dispense Refill  . acetaminophen (TYLENOL) 325 MG tablet Take 2 tablets (650 mg total) by mouth every 6 (six) hours as needed for mild pain (or Fever >/= 101).    . Blood Glucose Monitoring Suppl (TRUE METRIX METER) DEVI 1 kit by Does not apply route as directed. Use as directed 1 Device 0  . gabapentin (NEURONTIN) 400 MG capsule Take 1 capsule (400 mg total) by mouth 3 (three) times daily. 90 capsule 3  . glucose blood test strip Use as instructed 100 each 12  . insulin aspart (NOVOLOG) 100 UNIT/ML injection Inject 0-15 Units into the skin 3 (three) times daily with meals. 10 mL 11  . insulin aspart protamine- aspart (NOVOLOG MIX 70/30) (70-30) 100 UNIT/ML injection Inject 0.3 mLs (30 Units total) into the skin 2 (two) times daily with a meal. (Patient taking differently: Inject into the skin 2 (two) times daily with a meal. ) 10 mL 11  . Insulin Syringe-Needle U-100 (TRUEPLUS INSULIN SYRINGE) 31G X 5/16" 0.5 ML MISC USE AS DIRECTED. 100 each 6  . levothyroxine (SYNTHROID, LEVOTHROID) 50 MCG tablet Take 1 tablet (50 mcg total) by mouth daily. 90 tablet 3  . lisinopril  (PRINIVIL,ZESTRIL) 10 MG tablet Take 1 tablet (10 mg total) by mouth daily. 90 tablet 3  . ranitidine (ZANTAC) 150 MG tablet Take 150 mg by mouth 2 (two) times daily.    . TRUEPLUS LANCETS 28G MISC Use as directed 100 each 12  . bacitracin ointment Apply topically 2 (two) times daily. (Patient not taking: Reported on 10/21/2017) 120 g 0  . loperamide (IMODIUM) 2 MG capsule Take 2 mg by mouth as needed for diarrhea or loose stools.    . meclizine (ANTIVERT) 25 MG tablet Take 25 mg by mouth 3 (three) times daily as needed for dizziness or nausea.     No facility-administered medications prior to visit.     No Known Allergies     Objective:    BP 136/85 (BP Location: Left Arm, Patient Position: Sitting, Cuff Size: Normal)   Pulse (!) 110   Temp (!) 100.5 F (38.1 C) (Oral)  Ht _0  (1.753 m)   Wt 191 lb 6.4 oz (86.8 kg)   SpO2 97%   BMI 28.26 kg/m  Wt Readings from Last 3 Encounters:  11/20/17 191 lb 6.4 oz (86.8 kg)  10/21/17 188 lb 6.4 oz (85.5 kg)  09/30/17 193 lb (87.5 kg)    Physical Exam  Constitutional: He is oriented to person, place, and time. He appears well-developed and well-nourished. He is cooperative.  HENT:  Head: Normocephalic and atraumatic.  Eyes: EOM are normal.  Neck: Normal range of motion.  Cardiovascular: Regular rhythm and normal heart sounds. Tachycardia present. Exam reveals no gallop and no friction rub.  No murmur heard. Pulmonary/Chest: Effort normal and breath sounds normal. No tachypnea. No respiratory distress. He has no decreased breath sounds. He has no wheezes. He has no rhonchi. He has no rales. He exhibits no tenderness.  Abdominal: Soft. Bowel sounds are normal.  Musculoskeletal: He exhibits edema and tenderness.       Left foot: There is decreased range of motion, tenderness and swelling.  I did not examine the right foot. The patient only complained of left foot infection.   Feet:  Left Foot:  Skin Integrity: Positive for erythema and  warmth.  Neurological: He is alert and oriented to person, place, and time. Coordination normal.  Skin: Skin is warm and dry. There is erythema.     Multiple scar on bilateral arms. A few of the scars on both arms appear affected due to recent injection of heroin into these areas  Psychiatric: He has a normal mood and affect. His behavior is normal. Judgment and thought content normal.  Nursing note and vitals reviewed.     Patient has been counseled extensively about nutrition and exercise as well as the importance of adherence with medications and regular follow-up. The patient was given clear instructions to go to ER or return to medical center if symptoms don't improve, worsen or new problems develop. The patient verbalized understanding.   Follow-up: No follow-ups on file.   Gildardo Pounds, FNP-BC [facility] and [place], [place]   11/20/2017, 3:51 PM

## 2017-11-21 ENCOUNTER — Inpatient Hospital Stay (HOSPITAL_COMMUNITY)
Admission: EM | Admit: 2017-11-21 | Discharge: 2017-12-12 | DRG: 853 | Disposition: A | Discharge status: Home / Self Care | Payer: Medicaid Other | Attending: Internal Medicine | Admitting: Internal Medicine

## 2017-11-21 ENCOUNTER — Other Ambulatory Visit: Payer: Self-pay

## 2017-11-21 ENCOUNTER — Encounter (HOSPITAL_COMMUNITY): Payer: Self-pay | Admitting: Family Medicine

## 2017-11-21 ENCOUNTER — Emergency Department (HOSPITAL_COMMUNITY): Payer: Medicaid Other

## 2017-11-21 ENCOUNTER — Encounter: Payer: Self-pay | Admitting: Nurse Practitioner

## 2017-11-21 DIAGNOSIS — E1022 Type 1 diabetes mellitus with diabetic chronic kidney disease: Secondary | ICD-10-CM | POA: Diagnosis present

## 2017-11-21 DIAGNOSIS — L0390 Cellulitis, unspecified: Secondary | ICD-10-CM

## 2017-11-21 DIAGNOSIS — I129 Hypertensive chronic kidney disease with stage 1 through stage 4 chronic kidney disease, or unspecified chronic kidney disease: Secondary | ICD-10-CM | POA: Diagnosis present

## 2017-11-21 DIAGNOSIS — B182 Chronic viral hepatitis C: Secondary | ICD-10-CM | POA: Diagnosis present

## 2017-11-21 DIAGNOSIS — D62 Acute posthemorrhagic anemia: Secondary | ICD-10-CM | POA: Diagnosis not present

## 2017-11-21 DIAGNOSIS — F1410 Cocaine abuse, uncomplicated: Secondary | ICD-10-CM | POA: Diagnosis present

## 2017-11-21 DIAGNOSIS — E1069 Type 1 diabetes mellitus with other specified complication: Secondary | ICD-10-CM | POA: Diagnosis present

## 2017-11-21 DIAGNOSIS — L02611 Cutaneous abscess of right foot: Secondary | ICD-10-CM | POA: Diagnosis present

## 2017-11-21 DIAGNOSIS — Z765 Malingerer [conscious simulation]: Secondary | ICD-10-CM | POA: Diagnosis not present

## 2017-11-21 DIAGNOSIS — E1165 Type 2 diabetes mellitus with hyperglycemia: Secondary | ICD-10-CM

## 2017-11-21 DIAGNOSIS — Z794 Long term (current) use of insulin: Secondary | ICD-10-CM

## 2017-11-21 DIAGNOSIS — F1110 Opioid abuse, uncomplicated: Secondary | ICD-10-CM | POA: Diagnosis present

## 2017-11-21 DIAGNOSIS — L905 Scar conditions and fibrosis of skin: Secondary | ICD-10-CM | POA: Diagnosis present

## 2017-11-21 DIAGNOSIS — Z720 Tobacco use: Secondary | ICD-10-CM | POA: Diagnosis present

## 2017-11-21 DIAGNOSIS — I10 Essential (primary) hypertension: Secondary | ICD-10-CM

## 2017-11-21 DIAGNOSIS — A427 Actinomycotic sepsis: Secondary | ICD-10-CM | POA: Diagnosis present

## 2017-11-21 DIAGNOSIS — E43 Unspecified severe protein-calorie malnutrition: Secondary | ICD-10-CM | POA: Diagnosis present

## 2017-11-21 DIAGNOSIS — A409 Streptococcal sepsis, unspecified: Secondary | ICD-10-CM | POA: Diagnosis present

## 2017-11-21 DIAGNOSIS — L0291 Cutaneous abscess, unspecified: Secondary | ICD-10-CM

## 2017-11-21 DIAGNOSIS — E039 Hypothyroidism, unspecified: Secondary | ICD-10-CM | POA: Diagnosis present

## 2017-11-21 DIAGNOSIS — N182 Chronic kidney disease, stage 2 (mild): Secondary | ICD-10-CM

## 2017-11-21 DIAGNOSIS — N183 Chronic kidney disease, stage 3 (moderate): Secondary | ICD-10-CM | POA: Diagnosis present

## 2017-11-21 DIAGNOSIS — E1010 Type 1 diabetes mellitus with ketoacidosis without coma: Secondary | ICD-10-CM | POA: Diagnosis present

## 2017-11-21 DIAGNOSIS — G4700 Insomnia, unspecified: Secondary | ICD-10-CM | POA: Diagnosis present

## 2017-11-21 DIAGNOSIS — K219 Gastro-esophageal reflux disease without esophagitis: Secondary | ICD-10-CM

## 2017-11-21 DIAGNOSIS — M86072 Acute hematogenous osteomyelitis, left ankle and foot: Secondary | ICD-10-CM | POA: Diagnosis present

## 2017-11-21 DIAGNOSIS — F17210 Nicotine dependence, cigarettes, uncomplicated: Secondary | ICD-10-CM | POA: Diagnosis present

## 2017-11-21 DIAGNOSIS — E1042 Type 1 diabetes mellitus with diabetic polyneuropathy: Secondary | ICD-10-CM | POA: Diagnosis present

## 2017-11-21 DIAGNOSIS — I351 Nonrheumatic aortic (valve) insufficiency: Secondary | ICD-10-CM | POA: Diagnosis present

## 2017-11-21 DIAGNOSIS — N179 Acute kidney failure, unspecified: Secondary | ICD-10-CM | POA: Diagnosis present

## 2017-11-21 DIAGNOSIS — R7881 Bacteremia: Secondary | ICD-10-CM

## 2017-11-21 DIAGNOSIS — Z8249 Family history of ischemic heart disease and other diseases of the circulatory system: Secondary | ICD-10-CM

## 2017-11-21 DIAGNOSIS — B9689 Other specified bacterial agents as the cause of diseases classified elsewhere: Secondary | ICD-10-CM

## 2017-11-21 DIAGNOSIS — Z8701 Personal history of pneumonia (recurrent): Secondary | ICD-10-CM

## 2017-11-21 DIAGNOSIS — L02612 Cutaneous abscess of left foot: Secondary | ICD-10-CM | POA: Diagnosis present

## 2017-11-21 DIAGNOSIS — Z79899 Other long term (current) drug therapy: Secondary | ICD-10-CM

## 2017-11-21 DIAGNOSIS — B955 Unspecified streptococcus as the cause of diseases classified elsewhere: Secondary | ICD-10-CM

## 2017-11-21 DIAGNOSIS — B1920 Unspecified viral hepatitis C without hepatic coma: Secondary | ICD-10-CM

## 2017-11-21 DIAGNOSIS — L03116 Cellulitis of left lower limb: Secondary | ICD-10-CM | POA: Diagnosis present

## 2017-11-21 DIAGNOSIS — A419 Sepsis, unspecified organism: Secondary | ICD-10-CM

## 2017-11-21 DIAGNOSIS — F1910 Other psychoactive substance abuse, uncomplicated: Secondary | ICD-10-CM | POA: Diagnosis present

## 2017-11-21 DIAGNOSIS — L03115 Cellulitis of right lower limb: Secondary | ICD-10-CM

## 2017-11-21 DIAGNOSIS — Z6828 Body mass index (BMI) 28.0-28.9, adult: Secondary | ICD-10-CM

## 2017-11-21 DIAGNOSIS — A429 Actinomycosis, unspecified: Secondary | ICD-10-CM

## 2017-11-21 DIAGNOSIS — E1110 Type 2 diabetes mellitus with ketoacidosis without coma: Secondary | ICD-10-CM | POA: Diagnosis present

## 2017-11-21 DIAGNOSIS — L02419 Cutaneous abscess of limb, unspecified: Secondary | ICD-10-CM

## 2017-11-21 DIAGNOSIS — Z833 Family history of diabetes mellitus: Secondary | ICD-10-CM

## 2017-11-21 DIAGNOSIS — Z79890 Hormone replacement therapy (postmenopausal): Secondary | ICD-10-CM

## 2017-11-21 LAB — URINALYSIS, ROUTINE W REFLEX MICROSCOPIC
BILIRUBIN URINE: NEGATIVE
Glucose, UA: >=500 mg/dL — AB
Ketones, ur: 20 mg/dL — AB
LEUKOCYTES UA: NEGATIVE
Nitrite: NEGATIVE
PROTEIN: 30 mg/dL — AB
Specific Gravity, Urine: 1.027 (ref 1.005–1.030)
pH: 6 (ref 5.0–8.0)

## 2017-11-21 LAB — RAPID URINE DRUG SCREEN, HOSP PERFORMED
AMPHETAMINES: NOT DETECTED
Barbiturates: NOT DETECTED
Benzodiazepines: NOT DETECTED
Cocaine: POSITIVE — AB
Opiates: POSITIVE — AB
Tetrahydrocannabinol: NOT DETECTED

## 2017-11-21 LAB — COMPREHENSIVE METABOLIC PANEL
ALBUMIN: 2.9 g/dL — AB (ref 3.5–5.0)
ALT: 14 U/L (ref 0–44)
AST: 12 U/L — AB (ref 15–41)
Alkaline Phosphatase: 128 U/L — ABNORMAL HIGH (ref 38–126)
Anion gap: 17 — ABNORMAL HIGH (ref 5–15)
BUN: 19 mg/dL (ref 6–20)
CALCIUM: 8.8 mg/dL — AB (ref 8.9–10.3)
CHLORIDE: 95 mmol/L — AB (ref 98–111)
CO2: 19 mmol/L — AB (ref 22–32)
Creatinine, Ser: 1.95 mg/dL — ABNORMAL HIGH (ref 0.61–1.24)
GFR calc Af Amer: 49 mL/min — ABNORMAL LOW (ref >60)
GFR calc non Af Amer: 42 mL/min — ABNORMAL LOW (ref >60)
GLUCOSE: 682 mg/dL — AB (ref 70–99)
Potassium: 4.5 mmol/L (ref 3.5–5.1)
SODIUM: 131 mmol/L — AB (ref 135–145)
Total Bilirubin: 1.2 mg/dL (ref 0.3–1.2)
Total Protein: 6.9 g/dL (ref 6.5–8.1)

## 2017-11-21 LAB — I-STAT CG4 LACTIC ACID, ED: Lactic Acid, Venous: 1.22 mmol/L (ref 0.5–1.9)

## 2017-11-21 LAB — CBC WITH DIFFERENTIAL/PLATELET
Abs Immature Granulocytes: 0.2 10*3/uL — ABNORMAL HIGH (ref 0.0–0.1)
Basophils Absolute: 0.1 10*3/uL (ref 0.0–0.1)
Basophils Relative: 1 %
EOS ABS: 0.3 10*3/uL (ref 0.0–0.7)
Eosinophils Relative: 1 %
HCT: 36.1 % — ABNORMAL LOW (ref 39.0–52.0)
Hemoglobin: 11.9 g/dL — ABNORMAL LOW (ref 13.0–17.0)
IMMATURE GRANULOCYTES: 1 %
LYMPHS ABS: 3.2 10*3/uL (ref 0.7–4.0)
Lymphocytes Relative: 13 %
MCH: 27.6 pg (ref 26.0–34.0)
MCHC: 33 g/dL (ref 30.0–36.0)
MCV: 83.8 fL (ref 78.0–100.0)
MONO ABS: 2.3 10*3/uL — AB (ref 0.1–1.0)
MONOS PCT: 9 %
Neutro Abs: 18 10*3/uL — ABNORMAL HIGH (ref 1.7–7.7)
Neutrophils Relative %: 75 %
PLATELETS: 560 10*3/uL — AB (ref 150–400)
RBC: 4.31 MIL/uL (ref 4.22–5.81)
RDW: 13.2 % (ref 11.5–15.5)
WBC: 24.1 10*3/uL — ABNORMAL HIGH (ref 4.0–10.5)

## 2017-11-21 LAB — GLUCOSE, CAPILLARY
Glucose-Capillary: 519 mg/dL (ref 70–99)
Glucose-Capillary: >600 mg/dL (ref 70–99)
Glucose-Capillary: 487 mg/dL — ABNORMAL HIGH (ref 70–99)

## 2017-11-21 LAB — CG4 I-STAT (LACTIC ACID): LACTIC ACID, VENOUS: 1.77 mmol/L (ref 0.5–1.9)

## 2017-11-21 MED ORDER — LEVOTHYROXINE SODIUM 50 MCG PO TABS
50.0000 ug | ORAL_TABLET | Freq: Every day | ORAL | Status: DC
  Administered 2017-11-22 – 2017-12-12 (×21): 50 ug via ORAL
  Filled 2017-11-21 (×24): qty 1

## 2017-11-21 MED ORDER — PIPERACILLIN-TAZOBACTAM 3.375 G IVPB
3.3750 g | Freq: Once | INTRAVENOUS | Status: AC
  Administered 2017-11-21: 3.375 g via INTRAVENOUS
  Filled 2017-11-21: qty 50

## 2017-11-21 MED ORDER — TRAMADOL HCL 50 MG PO TABS
50.0000 mg | ORAL_TABLET | Freq: Four times a day (QID) | ORAL | Status: DC | PRN
  Administered 2017-11-21 – 2017-12-10 (×13): 50 mg via ORAL
  Filled 2017-11-21 (×13): qty 1

## 2017-11-21 MED ORDER — FAMOTIDINE 20 MG PO TABS
20.0000 mg | ORAL_TABLET | Freq: Two times a day (BID) | ORAL | Status: DC
  Administered 2017-11-21 – 2017-12-12 (×42): 20 mg via ORAL
  Filled 2017-11-21 (×42): qty 1

## 2017-11-21 MED ORDER — ACETAMINOPHEN 325 MG PO TABS
650.0000 mg | ORAL_TABLET | Freq: Four times a day (QID) | ORAL | Status: DC | PRN
  Administered 2017-11-24 – 2017-11-27 (×3): 650 mg via ORAL
  Filled 2017-11-21 (×3): qty 2

## 2017-11-21 MED ORDER — ENOXAPARIN SODIUM 40 MG/0.4ML ~~LOC~~ SOLN: 40.0000 mg | SUBCUTANEOUS | Status: DC

## 2017-11-21 MED ORDER — SODIUM CHLORIDE 0.9 % IV SOLN
INTRAVENOUS | Status: DC
  Administered 2017-11-21: 5.4 [IU]/h via INTRAVENOUS
  Filled 2017-11-21 (×2): qty 1

## 2017-11-21 MED ORDER — VANCOMYCIN HCL IN DEXTROSE 750-5 MG/150ML-% IV SOLN
750.0000 mg | Freq: Once | INTRAVENOUS | Status: AC
  Administered 2017-11-21: 750 mg via INTRAVENOUS
  Filled 2017-11-21: qty 150

## 2017-11-21 MED ORDER — DEXTROSE-NACL 5-0.45 % IV SOLN
INTRAVENOUS | Status: DC
  Administered 2017-11-22 (×2): via INTRAVENOUS

## 2017-11-21 MED ORDER — INSULIN REGULAR BOLUS VIA INFUSION
0.0000 [IU] | Freq: Three times a day (TID) | INTRAVENOUS | Status: DC
  Administered 2017-11-22: 6.5 [IU] via INTRAVENOUS
  Administered 2017-11-22: 4.5 [IU] via INTRAVENOUS
  Filled 2017-11-21: qty 10

## 2017-11-21 MED ORDER — GABAPENTIN 400 MG PO CAPS
400.0000 mg | ORAL_CAPSULE | Freq: Three times a day (TID) | ORAL | Status: DC
  Administered 2017-11-21 – 2017-12-12 (×62): 400 mg via ORAL
  Filled 2017-11-21 (×62): qty 1

## 2017-11-21 MED ORDER — HYDRALAZINE HCL 20 MG/ML IJ SOLN
10.0000 mg | INTRAMUSCULAR | Status: DC | PRN
  Filled 2017-11-21: qty 1

## 2017-11-21 MED ORDER — SODIUM CHLORIDE 0.9 % IV BOLUS
1000.0000 mL | Freq: Once | INTRAVENOUS | Status: AC
  Administered 2017-11-21: 1000 mL via INTRAVENOUS

## 2017-11-21 MED ORDER — SODIUM CHLORIDE 0.9% FLUSH
3.0000 mL | Freq: Two times a day (BID) | INTRAVENOUS | Status: DC
  Administered 2017-11-23 – 2017-11-30 (×7): 3 mL via INTRAVENOUS

## 2017-11-21 MED ORDER — ONDANSETRON HCL 4 MG/2ML IJ SOLN: 4.0000 mg | Freq: Four times a day (QID) | INTRAMUSCULAR | Status: DC | PRN

## 2017-11-21 MED ORDER — ONDANSETRON HCL 4 MG PO TABS: 4.0000 mg | ORAL_TABLET | Freq: Four times a day (QID) | ORAL | Status: DC | PRN

## 2017-11-21 MED ORDER — DEXTROSE 50 % IV SOLN: 25.0000 mL | INTRAVENOUS | Status: DC | PRN

## 2017-11-21 MED ORDER — VANCOMYCIN HCL IN DEXTROSE 1-5 GM/200ML-% IV SOLN
1000.0000 mg | Freq: Once | INTRAVENOUS | Status: AC
  Administered 2017-11-21: 1000 mg via INTRAVENOUS
  Filled 2017-11-21: qty 200

## 2017-11-21 MED ORDER — ACETAMINOPHEN 650 MG RE SUPP: 650.0000 mg | Freq: Four times a day (QID) | RECTAL | Status: DC | PRN

## 2017-11-21 MED ORDER — TRAZODONE HCL 50 MG PO TABS
50.0000 mg | ORAL_TABLET | Freq: Every day | ORAL | Status: DC
  Administered 2017-11-21 – 2017-12-11 (×21): 50 mg via ORAL
  Filled 2017-11-21 (×21): qty 1

## 2017-11-21 MED ORDER — SODIUM CHLORIDE 0.9 % IV SOLN
INTRAVENOUS | Status: DC
  Administered 2017-11-21 (×2): via INTRAVENOUS

## 2017-11-21 NOTE — H&P (Signed)
History and Physical   Luke [name] JSE:[id number] DOB: 06-Feb-1981 DOA: 11/21/2017  Referring MD/NP/PA: Dr. Cathi Roan PCP: Gildardo Pounds, NP  Patient coming from: Home  Chief Complaint: Leg wounds  HPI: Luke [name] is a 37 y.o. male with a history of IDDM, HTN, polysubstance IV drug use with recent relapse who presented to the ED with bilateral leg wounds worsening over the previous 1 week worst on the left than the right becoming more red and painful, limiting ability to bear weight on left foot associated with chills and malaise. These areas (2-3 on right foot/ankle and 5-6 on left foot and ankle, the worst of which is on left foot) are all at sites of IV drug injections. He has also noticed increasing glucose values despite continuing his usual insulin, up to 15 units TIDWC and eating less than usual. He states he didn't have these wounds more than a week ago but was prescribed bactrim and tramadol by his PCP for wounds on Aug 5. He also saw her yesterday where she urged him to go to the ED but he went home and came in today instead.  ED Course: On initial evaluation, temp 39F, sinus tachycardia at 144bpm, BP 146/84, no respiratory distress. Had very apparent wounds on feet and ankles L > R with abscess and cellulitis. Labs demonstrating WBC 24.1, platelets 560. In mild DKA with acidosis (bicarb 19), anion gap of 17, ketonuria. Glucose 682, Na 131, K 4.5. Creatinine slightly above baseline at 1.95. XR of the feet showed no bony abnormalities. Hospitalists called for admission. MRI of the left foot, blood cultures, and IV antibiotics requested.  Review of Systems: +headache, blurry vision lately, no chest pain, dyspnea, abd pain, N/V/D, urinary changes, constipation, and per HPI. All others reviewed and are negative.   Past Medical History:  Diagnosis Date  . Anemia   . Anxiety  Dx 2008  . Diabetes mellitus without complication ([place]) Dx 0737  . DKA (diabetic ketoacidoses) ([place]) 07/21/2017  .  GERD (gastroesophageal reflux disease) Dx 2008  . Headache(784.0)   . Pneumonia   . Seizures ([place]) 2011    x 2 in lifetime. on Dilantin for a while.   . Substance abuse ([place]) 2013    heroin use, multiple relapses   PSH: Had I&D of similar wounds in 2014 by Dr. Caralyn Guile on the left arm.   FH: Dad with DM at early age, both parents with heart disease. No thyroid issues.   SH: Lives with wife, uses cocaine most recently, but also heroin intermittently for the past 3 weeks after 15 months of sobriety. Wife has zero tolerance for this. Lives with 2 younger children but he has other children who live outside the house (69 years old). Just got back from a vacation to the beach. Completely denies EtOH or marijuana. Smokes tobacco.   Meds:  Prior to Admission medications   Medication Sig Start Date End Date Taking? Authorizing Provider  acetaminophen (TYLENOL) 500 MG tablet Take 2,000 mg by mouth every 6 (six) hours as needed for mild pain.   Yes [provider]  gabapentin (NEURONTIN) 400 MG capsule Take 1 capsule (400 mg total) by mouth 3 (three) times daily. 09/30/17 12/29/17 Yes Gildardo Pounds, NP  insulin aspart (NOVOLOG) 100 UNIT/ML injection Inject 0-15 Units into the skin 3 (three) times daily with meals. 07/25/17  Yes Georgette Shell, MD  insulin aspart protamine- aspart (NOVOLOG MIX 70/30) (70-30) 100 UNIT/ML injection Inject 0.3 mLs (30  Units total) into the skin 2 (two) times daily with a meal. Patient taking differently: Inject 30-40 Units into the skin See admin instructions.  07/25/17  Yes Georgette Shell, MD  levothyroxine (SYNTHROID, LEVOTHROID) 50 MCG tablet Take 1 tablet (50 mcg total) by mouth daily. 10/31/17  Yes Gildardo Pounds, NP  lisinopril (PRINIVIL,ZESTRIL) 10 MG tablet Take 1 tablet (10 mg total) by mouth daily. 09/30/17  Yes Gildardo Pounds, NP  ranitidine (ZANTAC) 150 MG tablet Take 150 mg by mouth 2 (two) times daily.   Yes [provider]    traZODone (DESYREL) 50 MG tablet Take 1 tablet (50 mg total) by mouth at bedtime. 11/20/17  Yes Gildardo Pounds, NP  acetaminophen (TYLENOL) 325 MG tablet Take 2 tablets (650 mg total) by mouth every 6 (six) hours as needed for mild pain (or Fever >/= 101). Patient not taking: Reported on 11/21/2017 12/19/16   Debbe Odea, MD  Blood Glucose Monitoring Suppl (TRUE METRIX METER) DEVI 1 kit by Does not apply route as directed. Use as directed 06/07/15   Boykin Nearing, MD  glucose blood test strip Use as instructed 09/30/17   Gildardo Pounds, NP  Insulin Syringe-Needle U-100 (TRUEPLUS INSULIN SYRINGE) 31G X 5/16" 0.5 ML MISC USE AS DIRECTED. 01/28/17   Alfonse Spruce, FNP  TRUEPLUS LANCETS 28G MISC Use as directed 01/28/17   Alfonse Spruce, FNP    Physical Exam: Vitals:   11/21/17 1645 11/21/17 1700 11/21/17 1715 11/21/17 1800  BP: (!) 141/84 137/87 140/81 121/84  Pulse: (!) 109 (!) 110 (!) 112 (!) 115  Resp: '14 18 14 17  '$ Temp:      TempSrc:      SpO2: 97% 95% 95% 97%   Constitutional: 37 y.o. male in no distress, calm demeanor Eyes: Lids and conjunctivae normal, PERRL ENMT: Mucous membranes are dry. Posterior pharynx clear of any exudate or lesions. Poor dentition.  Neck: Normal, supple, no masses, no thyromegaly or thyroid nodules Respiratory: Non-labored breathing room air without accessory muscle use. Clear breath sounds to auscultation bilaterally Cardiovascular: Regular tachycardia, no murmurs, rubs, or gallops. No carotid bruits. No JVD. 2+ pedal pulses. Abdomen: Normoactive bowel sounds. No tenderness, non-distended, and no masses palpated. No hepatosplenomegaly. GU: No indwelling catheter Musculoskeletal: No clubbing / cyanosis. No joint deformity upper and lower extremities. Good ROM, no contractures. Normal muscle tone.  Skin: Large superficial fluctuant abscess on left foot dorsum with smaller healing wounds on left medial ankle above malleolus and 2 on proximal  foot. No ulcers on sole of feet. Right foot with similar distribution with abscess deeper on dorsum with overlying erythema. Diffuse edema at lower legs. Also has extensive scarring to left forearm and right forearm. Sensation intact at toes with intact ROM and brisk cap refill. Neurologic: CN II-XII grossly intact. Gait not assessed. Speech normal. No focal deficits in motor strength or sensation in all extremities.  Psychiatric: Alert and oriented x3. Poor judgment and  fair insight. Mood euthymic with congruent, pleasant affect.   Labs on Admission: I have personally reviewed following labs and imaging studies  CBC: Recent Labs  Lab 11/21/17 1332  WBC 24.1*  NEUTROABS 18.0*  HGB 11.9*  HCT 36.1*  MCV 83.8  PLT 854*   Basic Metabolic Panel: Recent Labs  Lab 11/21/17 1332  NA 131*  K 4.5  CL 95*  CO2 19*  GLUCOSE 682*  BUN 19  CREATININE 1.95*  CALCIUM 8.8*   GFR: Estimated Creatinine Clearance:  56.6 mL/min (A) (by C-G formula based on SCr of 1.95 mg/dL (H)).   Liver Function Tests: Recent Labs  Lab 11/21/17 1332  AST 12*  ALT 14  ALKPHOS 128*  BILITOT 1.2  PROT 6.9  ALBUMIN 2.9*   Urine analysis:    Component Value Date/Time   COLORURINE STRAW (A) 11/21/2017 1320   APPEARANCEUR CLEAR 11/21/2017 1320   APPEARANCEUR Clear 04/11/2014 0138   LABSPEC 1.027 11/21/2017 1320   LABSPEC 1.026 04/11/2014 0138   PHURINE 6.0 11/21/2017 1320   GLUCOSEU >=500 (A) 11/21/2017 1320   GLUCOSEU >=500 04/11/2014 0138   HGBUR MODERATE (A) 11/21/2017 1320   BILIRUBINUR NEGATIVE 11/21/2017 1320   BILIRUBINUR neg 02/18/2017 0946   BILIRUBINUR Negative 04/11/2014 0138   KETONESUR 20 (A) 11/21/2017 1320   PROTEINUR 30 (A) 11/21/2017 1320   UROBILINOGEN 0.2 02/18/2017 0946   UROBILINOGEN 0.2 12/10/2013 1221   NITRITE NEGATIVE 11/21/2017 1320   LEUKOCYTESUR NEGATIVE 11/21/2017 1320   LEUKOCYTESUR Negative 04/11/2014 0138   Radiological Exams on Admission: Dg Foot Complete  Left 11/21/2017 Soft tissue swelling over the dorsum of the foot. No underlying bony abnormality.   Dg Foot Complete Right 11/21/2017 Soft tissue swelling over the dorsum. No evidence of active osteomyelitis.   Assessment/Plan Principal Problem:   Sepsis due to cellulitis [facility]) Active Problems:   Drug abuse, IV ([place])   Tobacco abuse   DM type 2, uncontrolled, with renal complications ([name])   Essential hypertension   Skin abscess   DKA (diabetic ketoacidoses) ([name])   Diabetic polyneuropathy associated with type 2 diabetes mellitus ([place])   Acute renal failure superimposed on stage 2 chronic kidney disease ([name])   GERD (gastroesophageal reflux disease)   Sepsis due to cellulitis and abscess of both feet: At sites of injection. Has h/o debridement on left arm for similar presentation  in 2014. - IVF's - Blood cultures - Vancomycin, zosyn for now.  - Check CRP, MRI left foot for r/o osteomyelitis. Will need surgical consult (ortho if osteomyelitis, general surgery if no osteo).   Poorly controlled IDDM with DKA: Worsened by sepsis. - DKA protocol - Isotonic saline until euvolemic - Insulin gtt per protocol,  transition to sq when anion gap is normal, bicarb is improved - Transition to D51/2 NS after blood glucose is <250 - Close observation of K, replace as indicated.  - DM coordinator consult.  Polysubstance IV drug use: Relapsed from 15 months sobriety about 3 weeks ago.   - Cessation counseling performed extensively at admission. Pt ready to quit. CSW consulted. Plan is to move away from [place]. - Check UDS  AKI on stage II CKD: Due to longstanding and poorly controlled DM worsened now from prerenal injury.  - Give NS bolus now, continue isotonic fluids afterward - Avoid nephrotoxins. CrCl ~42m/min, so ok w/gadolinium contrast.   HTN:  - Hold lisinopril.  - Prn hydralazine   Elevated TSH: 6.831, marginal elevation, but started on synthroid by PCP. With suspicion for more of  a type 1 DM (Dx in early 20's, insulin dependent, in recurrent DKA), there's definitely a possibility of autoimmune thyroid disease.  - Continue synthroid 587m.  GERD:  - Acid suppression  Diabetic polyneuropathy:  - Continue gabapentin  Insomnia:  - Continue trazodone  DVT prophylaxis: Lovenox  Code Status: Full  Family Communication: None at bedside Disposition Plan: Admit to inpatient Consults called: None  Admission status: Inpatient    RyPatrecia PourMD [facility] [email or web address] Password [email or web address]/07/2017, 6:24  PM  

## 2017-11-21 NOTE — ED Provider Notes (Signed)
[facility] EMERGENCY DEPARTMENT Provider Note   CSN: [id number] Arrival date & time: 11/21/17  1255     History   Chief Complaint Chief Complaint  Patient presents with  . Wound Check    HPI Luke [name] is a 37 y.o. male.  HPI Patient presents to the emergency department with infection to the left and right foot.  Patient states he injected heroin into both feet and noticed these infections developing over the last week.  The patient states his blood sugars have been running high.  Patient states that he has been feeling chilled but no definite fevers.  He states the left foot is worse than the right.  The patient denies chest pain, shortness of breath, headache,blurred vision, neck pain, fever, cough, weakness, numbness, dizziness, anorexia, edema, abdominal pain, nausea, vomiting, diarrhea, rash, back pain, dysuria, hematemesis, bloody stool, near syncope, or syncope. Past Medical History:  Diagnosis Date  . Anemia   . Anxiety  Dx 2008  . Diabetes mellitus without complication ([place]) Dx 8527  . DKA (diabetic ketoacidoses) ([place]) 07/21/2017  . GERD (gastroesophageal reflux disease) Dx 2008  . Headache(784.0)   . Pneumonia   . Seizures ([place]) 2011    x 2 in lifetime. on Dilantin for a while.   . Substance abuse ([place]) 2013    heroin use, multiple relapses    Patient Active Problem List   Diagnosis Date Noted  . Type 2 diabetes mellitus with ketoacidosis without coma, with long-term current use of insulin ([place]) 07/21/2017  . Acute renal failure superimposed on stage 2 chronic kidney disease ([place]) 07/21/2017  . GERD (gastroesophageal reflux disease) 07/21/2017  . Abdominal pain 07/21/2017  . Diabetic polyneuropathy associated with type 2 diabetes mellitus ([place])   . Prostatitis, acute   . MRSA infection   . Noncompliant bladder   . Urinary retention   . Prostate abscess   . Hyperglycemia 12/16/2016  . Tachycardia 12/16/2016  . Fever 12/16/2016  . ARF  (acute renal failure) ([place]) 12/16/2016  . Anemia 12/16/2016  . Cocaine abuse ([place]) 08/16/2015  . Type 2 diabetes mellitus with hyperglycemia, with long-term current use of insulin ([place]) 08/16/2015  . Cellulitis 12/03/2014  . Hyponatremia 12/03/2014  . AKI (acute kidney injury) ([place]) 12/03/2014  . Left leg pain 12/03/2014  . Elevated liver enzymes 07/26/2014  . Dental caries 07/23/2014  . Tinea pedis 07/23/2014  . Scars 07/23/2014  . DKA (diabetic ketoacidoses) ([place]) 11/23/2013  . Skin abscess 10/14/2012  . Leukocytosis 10/14/2012  . Drug abuse, IV ([place]) 10/11/2012  . Tobacco abuse 10/11/2012  . DM type 2, uncontrolled, with renal complications ([place]) 78/24/2353  . Essential hypertension 10/11/2012    Past Surgical History:  Procedure Laterality Date  . I&D EXTREMITY Left 10/11/2012   Procedure: IRRIGATION AND DEBRIDEMENT ABSCESS FOREARM;  Surgeon: Linna Hoff, MD;  Location: [facility];  Service: Orthopedics;  Laterality: Left;  . I&D EXTREMITY Left 10/12/2012   Procedure: IRRIGATION AND DEBRIDEMENT FOREARM;  Surgeon: Linna Hoff, MD;  Location: [facility];  Service: Orthopedics;  Laterality: Left;  . I&D EXTREMITY Left 10/14/2012   Procedure: incision and drainage left forearm;  Surgeon: Linna Hoff, MD;  Location: [facility];  Service: Orthopedics;  Laterality: Left;  . I&D EXTREMITY Left 10/16/2012   Procedure: IRRIGATION AND DEBRIDEMENT LEFT FOREARM;  Surgeon: Linna Hoff, MD;  Location: [facility];  Service: Orthopedics;  Laterality: Left;  . I&D EXTREMITY Left 10/20/2012   Procedure: INCISION AND  DRAINAGE AND DEBRIDEMENT LEFT  FOREARM;  Surgeon: Linna Hoff, MD;  Location: [facility];  Service: Orthopedics;  Laterality: Left;  . IRRIGATION AND DEBRIDEMENT ABSCESS     Hx: of left arm abscess related to drug use         Home Medications    Prior to Admission medications   Medication Sig Start Date End Date Taking? Authorizing Provider  acetaminophen (TYLENOL) 500 MG tablet Take 2,000  mg by mouth every 6 (six) hours as needed for mild pain.   Yes [provider]  gabapentin (NEURONTIN) 400 MG capsule Take 1 capsule (400 mg total) by mouth 3 (three) times daily. 09/30/17 12/29/17 Yes Gildardo Pounds, NP  insulin aspart (NOVOLOG) 100 UNIT/ML injection Inject 0-15 Units into the skin 3 (three) times daily with meals. 07/25/17  Yes Georgette Shell, MD  insulin aspart protamine- aspart (NOVOLOG MIX 70/30) (70-30) 100 UNIT/ML injection Inject 0.3 mLs (30 Units total) into the skin 2 (two) times daily with a meal. Patient taking differently: Inject 30-40 Units into the skin See admin instructions.  07/25/17  Yes Georgette Shell, MD  levothyroxine (SYNTHROID, LEVOTHROID) 50 MCG tablet Take 1 tablet (50 mcg total) by mouth daily. 10/31/17  Yes Gildardo Pounds, NP  lisinopril (PRINIVIL,ZESTRIL) 10 MG tablet Take 1 tablet (10 mg total) by mouth daily. 09/30/17  Yes Gildardo Pounds, NP  ranitidine (ZANTAC) 150 MG tablet Take 150 mg by mouth 2 (two) times daily.   Yes [provider]  traZODone (DESYREL) 50 MG tablet Take 1 tablet (50 mg total) by mouth at bedtime. 11/20/17  Yes Gildardo Pounds, NP  acetaminophen (TYLENOL) 325 MG tablet Take 2 tablets (650 mg total) by mouth every 6 (six) hours as needed for mild pain (or Fever >/= 101). Patient not taking: Reported on 11/21/2017 12/19/16   Debbe Odea, MD  Blood Glucose Monitoring Suppl (TRUE METRIX METER) DEVI 1 kit by Does not apply route as directed. Use as directed 06/07/15   Boykin Nearing, MD  glucose blood test strip Use as instructed 09/30/17   Gildardo Pounds, NP  Insulin Syringe-Needle U-100 (TRUEPLUS INSULIN SYRINGE) 31G X 5/16" 0.5 ML MISC USE AS DIRECTED. 01/28/17   Alfonse Spruce, FNP  TRUEPLUS LANCETS 28G MISC Use as directed 01/28/17   Alfonse Spruce, FNP    Family History Family History  Problem Relation Age of Onset  . Diabetes Father   . Heart disease Father   . Mental illness Sister    . Cancer Neg Hx     Social History Social History   Tobacco Use  . Smoking status: Current Every Day Smoker    Packs/day: 1.00    Types: Cigarettes  . Smokeless tobacco: Never Used  Substance Use Topics  . Alcohol use: No    Alcohol/week: 0.0 standard drinks    Comment: occasional  . Drug use: Not Currently    Types: Cocaine, IV, Heroin    Comment: last use Cocaine used 08/14/2015. IV last used Feb 2017     Allergies   Patient has no known allergies.   Review of Systems Review of Systems All other systems negative except as documented in the HPI. All pertinent positives and negatives as reviewed in the HPI.  Physical Exam Updated Vital Signs BP 140/81   Pulse (!) 112   Temp 98.7 F (37.1 C) (Oral)   Resp 14   SpO2 95%   Physical Exam  Constitutional: He is  oriented to person, place, and time. He appears well-developed and well-nourished. No distress.  HENT:  Head: Normocephalic and atraumatic.  Mouth/Throat: Oropharynx is clear and moist.  Eyes: Pupils are equal, round, and reactive to light.  Neck: Normal range of motion. Neck supple.  Cardiovascular: Normal rate, regular rhythm and normal heart sounds. Exam reveals no gallop and no friction rub.  No murmur heard. Pulmonary/Chest: Effort normal and breath sounds normal. No respiratory distress. He has no wheezes.  Musculoskeletal:       Feet:  Neurological: He is alert and oriented to person, place, and time. He exhibits normal muscle tone. Coordination normal.  Skin: Skin is warm and dry. Capillary refill takes less than 2 seconds. No rash noted. No erythema.  Psychiatric: He has a normal mood and affect. His behavior is normal.  Nursing note and vitals reviewed.    ED Treatments / Results  Labs (all labs ordered are listed, but only abnormal results are displayed) Labs Reviewed  COMPREHENSIVE METABOLIC PANEL - Abnormal; Notable for the following components:      Result Value   Sodium 131 (*)     Chloride 95 (*)    CO2 19 (*)    Glucose, Bld 682 (*)    Creatinine, Ser 1.95 (*)    Calcium 8.8 (*)    Albumin 2.9 (*)    AST 12 (*)    Alkaline Phosphatase 128 (*)    GFR calc non Af Amer 42 (*)    GFR calc Af Amer 49 (*)    Anion gap 17 (*)    All other components within normal limits  CBC WITH DIFFERENTIAL/PLATELET - Abnormal; Notable for the following components:   WBC 24.1 (*)    Hemoglobin 11.9 (*)    HCT 36.1 (*)    Platelets 560 (*)    Neutro Abs 18.0 (*)    Monocytes Absolute 2.3 (*)    Abs Immature Granulocytes 0.2 (*)    All other components within normal limits  URINALYSIS, ROUTINE W REFLEX MICROSCOPIC - Abnormal; Notable for the following components:   Color, Urine STRAW (*)    Glucose, UA >=500 (*)    Hgb urine dipstick MODERATE (*)    Ketones, ur 20 (*)    Protein, ur 30 (*)    Bacteria, UA RARE (*)    All other components within normal limits  CULTURE, BLOOD (ROUTINE X 2)  CULTURE, BLOOD (ROUTINE X 2)  RAPID URINE DRUG SCREEN, HOSP PERFORMED  I-STAT CG4 LACTIC ACID, ED  I-STAT CG4 LACTIC ACID, ED    EKG None  Radiology Dg Foot Complete Left  Result Date: 11/21/2017 CLINICAL DATA:  Bilateral foot pain for 4-5 days, left-greater-than-right, swelling and open sores, the patient is diabetic, and has a smoking history EXAM: LEFT FOOT - COMPLETE 3+ VIEW COMPARISON:  None. FINDINGS: There is soft tissue swelling over the dorsum of the left foot. However no underlying focal demineralization or erosion is seen to indicate osteomyelitis. No acute abnormality is noted. Joint spaces appear normal. No erosion is seen. IMPRESSION: Soft tissue swelling over the dorsum of the foot. No underlying bony abnormality. Electronically Signed   By: Ivar Drape M.D.   On: 11/21/2017 15:30   Dg Foot Complete Right  Result Date: 11/21/2017 CLINICAL DATA:  Bilateral foot pain for 4-5 days, left-greater-than-right with swelling and redness, the patient is diabetic and does have a  smoking history EXAM: RIGHT FOOT COMPLETE - 3+ VIEW COMPARISON:  None FINDINGS: There  is mild soft tissue swelling over the dorsum of the right foot. No underlying bony demineralization or erosion is seen to indicate active osteomyelitis. Alignment is normal and joint spaces appear normal. IMPRESSION: Soft tissue swelling over the dorsum. No evidence of active osteomyelitis. Electronically Signed   By: Ivar Drape M.D.   On: 11/21/2017 15:32    Procedures Procedures (including critical care time)  Medications Ordered in ED Medications  vancomycin (VANCOCIN) IVPB 1000 mg/200 mL premix (has no administration in time range)  piperacillin-tazobactam (ZOSYN) IVPB 3.375 g (has no administration in time range)  sodium chloride 0.9 % bolus 1,000 mL (has no administration in time range)  dextrose 5 %-0.45 % sodium chloride infusion (has no administration in time range)  insulin regular bolus via infusion 0-10 Units (has no administration in time range)  insulin regular (NOVOLIN R,HUMULIN R) 100 Units in sodium chloride 0.9 % 100 mL (1 Units/mL) infusion (has no administration in time range)  dextrose 50 % solution 25 mL (has no administration in time range)  0.9 %  sodium chloride infusion (has no administration in time range)     Initial Impression / Assessment and Plan / ED Course  I have reviewed the triage vital signs and the nursing notes.  Pertinent labs & imaging results that were available during my care of the patient were reviewed by me and considered in my medical decision making (see chart for details).     There is concerned that the patient has osteomyelitis in the left foot I spoke with the [facility] who will admit the patient.  He agreed that IV antibiotics along with MRI would be the next best steps.  These were ordered and patient will be admitted for further evaluation and care.  Final Clinical Impressions(s) / ED Diagnoses   Final diagnoses:  None    ED Discharge  Orders    None       Dalia Heading, PA-C 11/22/17 0037    Malvin Johns, MD 11/30/17 [phone]

## 2017-11-21 NOTE — ED Notes (Signed)
Patient given by water by PA student

## 2017-11-21 NOTE — ED Notes (Signed)
Admitting MD at bedside.

## 2017-11-21 NOTE — ED Notes (Signed)
Pt informed he needs to provide a UA. Pt verbalized understanding / given cup, but does not have to go at this time.

## 2017-11-21 NOTE — ED Provider Notes (Signed)
Patient placed in Quick Look pathway, seen and evaluated   Chief Complaint: wound  HPI:   1 week of swelling, redness, warmth, pain and drainage from wound on top of left foot. Also has swelling, redness to top of right foot.  Admits to recent relapse and use of IVDU (cocaine) 3 weeks ago. Was at PCP yesterday who told him to come to ER but he refused. H/o T1DM, h/o DKA.   ROS: no fevers.   Physical Exam:   Gen: No distress  Neuro: Awake and Alert  Skin: Warm    Focused Exam: Tachycardic. Non toxic. Very fluctuant, tender wound on top left foot. Multiple other scabbed wounds to left foot and medial ankle.  Erythema, edema, tenderness, fluctuance over top right foot. Toes and pink with good cap refill. See pictures.          Initiation of care has begun. The patient has been counseled on the process, plan, and necessity for staying for the completion/evaluation, and the remainder of the medical screening examination    Kinnie Feil, PA-C 11/21/17 1337    Malvin Johns, MD 11/30/17 [phone]

## 2017-11-21 NOTE — ED Triage Notes (Signed)
Pt has large amount of redness and swelling to foot. Pt states this has been going an ongoing issue.

## 2017-11-21 NOTE — Progress Notes (Signed)
Pharmacy Antibiotic Note  Luke [name] is a 37 y.o. male with a hx of IVDU admitted on 11/21/2017 with concern for osteomyelitis.  Pharmacy has been consulted for vancomycin and zosyn dosing. WBCs 24.1, sCr 1.95 afebrile,   Plan: Vancomycin 1750mg  IV x1 then 750mg  Q12H Zosyn 3.35mg  IV Q8H F/u LOT/de-escalation, cultures, renal function, troughs prn     Temp (24hrs), Avg:98.9 F (37.2 C), Min:98.7 F (37.1 C), Max:99 F (37.2 C)  Recent Labs  Lab 11/21/17 1332 11/21/17 1349  WBC 24.1*  --   CREATININE 1.95*  --   LATICACIDVEN  --  1.22    Estimated Creatinine Clearance: 56.6 mL/min (A) (by C-G formula based on SCr of 1.95 mg/dL (H)).    No Known Allergies  Antimicrobials this admission:   Dose adjustments this admission:   Microbiology results:  Thank you for allowing pharmacy to be a part of this patient's care.  Rush Barer, PharmD 11/21/2017 6:12 PM

## 2017-11-22 ENCOUNTER — Inpatient Hospital Stay: Payer: Self-pay

## 2017-11-22 ENCOUNTER — Inpatient Hospital Stay (HOSPITAL_COMMUNITY): Payer: Medicaid Other

## 2017-11-22 DIAGNOSIS — N170 Acute kidney failure with tubular necrosis: Secondary | ICD-10-CM

## 2017-11-22 LAB — GLUCOSE, CAPILLARY
GLUCOSE-CAPILLARY: 131 mg/dL — AB (ref 70–99)
GLUCOSE-CAPILLARY: 171 mg/dL — AB (ref 70–99)
GLUCOSE-CAPILLARY: 219 mg/dL — AB (ref 70–99)
GLUCOSE-CAPILLARY: 221 mg/dL — AB (ref 70–99)
GLUCOSE-CAPILLARY: 268 mg/dL — AB (ref 70–99)
GLUCOSE-CAPILLARY: 341 mg/dL — AB (ref 70–99)
Glucose-Capillary: 378 mg/dL — ABNORMAL HIGH (ref 70–99)
Glucose-Capillary: 251 mg/dL — ABNORMAL HIGH (ref 70–99)
Glucose-Capillary: 213 mg/dL — ABNORMAL HIGH (ref 70–99)
Glucose-Capillary: 170 mg/dL — ABNORMAL HIGH (ref 70–99)
Glucose-Capillary: 142 mg/dL — ABNORMAL HIGH (ref 70–99)
Glucose-Capillary: 137 mg/dL — ABNORMAL HIGH (ref 70–99)
Glucose-Capillary: 261 mg/dL — ABNORMAL HIGH (ref 70–99)
Glucose-Capillary: 139 mg/dL — ABNORMAL HIGH (ref 70–99)
Glucose-Capillary: 160 mg/dL — ABNORMAL HIGH (ref 70–99)
Glucose-Capillary: 173 mg/dL — ABNORMAL HIGH (ref 70–99)

## 2017-11-22 LAB — COMPREHENSIVE METABOLIC PANEL
ALK PHOS: 94 U/L (ref 38–126)
ALT: 11 U/L (ref 0–44)
AST: 15 U/L (ref 15–41)
Albumin: 2.2 g/dL — ABNORMAL LOW (ref 3.5–5.0)
Anion gap: 8 (ref 5–15)
BUN: 11 mg/dL (ref 6–20)
CALCIUM: 8.2 mg/dL — AB (ref 8.9–10.3)
CHLORIDE: 107 mmol/L (ref 98–111)
CO2: 23 mmol/L (ref 22–32)
CREATININE: 1.16 mg/dL (ref 0.61–1.24)
GFR calc Af Amer: >60 mL/min (ref >60)
Glucose, Bld: 169 mg/dL — ABNORMAL HIGH (ref 70–99)
Potassium: 3.6 mmol/L (ref 3.5–5.1)
Sodium: 138 mmol/L (ref 135–145)
Total Bilirubin: 0.7 mg/dL (ref 0.3–1.2)
Total Protein: 5.5 g/dL — ABNORMAL LOW (ref 6.5–8.1)

## 2017-11-22 LAB — C-REACTIVE PROTEIN: CRP: 12.2 mg/dL — AB (ref <1.0)

## 2017-11-22 LAB — BLOOD CULTURE ID PANEL (REFLEXED)
Acinetobacter baumannii: NOT DETECTED
CANDIDA GLABRATA: NOT DETECTED
CANDIDA KRUSEI: NOT DETECTED
CANDIDA TROPICALIS: NOT DETECTED
Candida albicans: NOT DETECTED
Candida parapsilosis: NOT DETECTED
ENTEROBACTER CLOACAE COMPLEX: NOT DETECTED
ESCHERICHIA COLI: NOT DETECTED
Enterobacteriaceae species: NOT DETECTED
Enterococcus species: NOT DETECTED
Haemophilus influenzae: NOT DETECTED
KLEBSIELLA PNEUMONIAE: NOT DETECTED
Klebsiella oxytoca: NOT DETECTED
Listeria monocytogenes: NOT DETECTED
Neisseria meningitidis: NOT DETECTED
PROTEUS SPECIES: NOT DETECTED
Pseudomonas aeruginosa: NOT DETECTED
STAPHYLOCOCCUS SPECIES: NOT DETECTED
Serratia marcescens: NOT DETECTED
Staphylococcus aureus (BCID): NOT DETECTED
Streptococcus agalactiae: NOT DETECTED
Streptococcus pneumoniae: NOT DETECTED
Streptococcus pyogenes: NOT DETECTED
Streptococcus species: DETECTED — AB

## 2017-11-22 LAB — CBC
HEMATOCRIT: 28.9 % — AB (ref 39.0–52.0)
Hemoglobin: 9.3 g/dL — ABNORMAL LOW (ref 13.0–17.0)
MCH: 26.9 pg (ref 26.0–34.0)
MCHC: 32.2 g/dL (ref 30.0–36.0)
MCV: 83.5 fL (ref 78.0–100.0)
Platelets: 453 10*3/uL — ABNORMAL HIGH (ref 150–400)
RBC: 3.46 MIL/uL — ABNORMAL LOW (ref 4.22–5.81)
RDW: 13.1 % (ref 11.5–15.5)
WBC: 19.5 10*3/uL — ABNORMAL HIGH (ref 4.0–10.5)

## 2017-11-22 LAB — HEMOGLOBIN A1C
Hgb A1c MFr Bld: 12.1 % — ABNORMAL HIGH (ref 4.8–5.6)
MEAN PLASMA GLUCOSE: 300.57 mg/dL

## 2017-11-22 LAB — MRSA PCR SCREENING: MRSA by PCR: NEGATIVE

## 2017-11-22 MED ORDER — SODIUM CHLORIDE 0.9 % IV SOLN
2.0000 g | INTRAVENOUS | Status: DC
  Administered 2017-11-23: 2 g via INTRAVENOUS
  Filled 2017-11-22: qty 20

## 2017-11-22 MED ORDER — PIPERACILLIN-TAZOBACTAM 3.375 G IVPB
3.3750 g | Freq: Three times a day (TID) | INTRAVENOUS | Status: DC
  Administered 2017-11-22: 3.375 g via INTRAVENOUS
  Filled 2017-11-22: qty 50

## 2017-11-22 MED ORDER — SODIUM CHLORIDE 0.9% FLUSH
10.0000 mL | INTRAVENOUS | Status: DC | PRN
  Administered 2017-11-25 – 2017-11-26 (×2): 10 mL
  Administered 2017-11-27: 20 mL
  Administered 2017-11-28 – 2017-11-29 (×2): 10 mL
  Filled 2017-11-22 (×5): qty 40

## 2017-11-22 MED ORDER — POTASSIUM CHLORIDE IN NACL 40-0.9 MEQ/L-% IV SOLN
INTRAVENOUS | Status: DC
  Administered 2017-11-22 – 2017-11-25 (×7): 125 mL/h via INTRAVENOUS
  Filled 2017-11-22 (×12): qty 1000

## 2017-11-22 MED ORDER — INSULIN ASPART 100 UNIT/ML ~~LOC~~ SOLN
0.0000 [IU] | Freq: Three times a day (TID) | SUBCUTANEOUS | Status: DC
  Administered 2017-11-22: 2 [IU] via SUBCUTANEOUS

## 2017-11-22 MED ORDER — METRONIDAZOLE IN NACL 5-0.79 MG/ML-% IV SOLN
500.0000 mg | Freq: Three times a day (TID) | INTRAVENOUS | Status: DC
  Administered 2017-11-23 (×2): 500 mg via INTRAVENOUS
  Filled 2017-11-22 (×2): qty 100

## 2017-11-22 MED ORDER — VANCOMYCIN HCL IN DEXTROSE 750-5 MG/150ML-% IV SOLN
750.0000 mg | Freq: Two times a day (BID) | INTRAVENOUS | Status: DC
  Administered 2017-11-22 – 2017-11-25 (×6): 750 mg via INTRAVENOUS
  Filled 2017-11-22 (×6): qty 150

## 2017-11-22 MED ORDER — SODIUM CHLORIDE 0.9% FLUSH
10.0000 mL | INTRAVENOUS | Status: DC | PRN
  Administered 2017-12-02 – 2017-12-11 (×4): 10 mL
  Filled 2017-11-22 (×4): qty 40

## 2017-11-22 MED ORDER — SODIUM CHLORIDE 0.9 % IV SOLN
INTRAVENOUS | Status: AC
  Administered 2017-11-22 (×2): 1.6 [IU]/h via INTRAVENOUS
  Filled 2017-11-22: qty 1

## 2017-11-22 MED ORDER — GADOBUTROL 1 MMOL/ML IV SOLN
8.5000 mL | Freq: Once | INTRAVENOUS | Status: AC | PRN
  Administered 2017-11-22: 8.5 mL via INTRAVENOUS

## 2017-11-22 MED ORDER — INSULIN ASPART PROT & ASPART (70-30 MIX) 100 UNIT/ML ~~LOC~~ SUSP
30.0000 [IU] | Freq: Two times a day (BID) | SUBCUTANEOUS | Status: DC
  Administered 2017-11-22 – 2017-11-23 (×2): 30 [IU] via SUBCUTANEOUS
  Filled 2017-11-22: qty 10

## 2017-11-22 NOTE — Progress Notes (Signed)
Patient refused ABG.  RN present at bedside

## 2017-11-22 NOTE — Progress Notes (Signed)
Patient arrived to the unit via bed from the emergency department.  Patient is alert and oriented. Skin assessment complete.  Patient has several wounds to the left and right foot.  Unable to measure due to patient wounds because patient refused nurse to touch due to the pain.  Educated the patient on how to reach the staff on the unit.  Placed the call light within reach and lowered the bed.  Will continue to monitor the patient and notify as needed

## 2017-11-22 NOTE — Progress Notes (Signed)
Peripherally Inserted Central Catheter/Midline Placement  The IV Nurse has discussed with the patient and/or persons authorized to consent for the patient, the purpose of this procedure and the potential benefits and risks involved with this procedure.  The benefits include less needle sticks, lab draws from the catheter, and the patient may be discharged home with the catheter. Risks include, but not limited to, infection, bleeding, blood clot (thrombus formation), and puncture of an artery; nerve damage and irregular heartbeat and possibility to perform a PICC exchange if needed/ordered by physician.  Alternatives to this procedure were also discussed.  Bard Power PICC patient education guide, fact sheet on infection prevention and patient information card has been provided to patient /or left at bedside.    PICC/Midline Placement Documentation        Luke [name] 11/22/2017, 5:17 PM

## 2017-11-22 NOTE — Progress Notes (Signed)
Spoke with patient about his diabetes. States that he had been taking his insulin. Takes 70/30 insulin 40 units every am, 30 units every HS, and takes Regular insulin sliding scale with meals at home. States that his high blood sugars on admission were from the infection in his feet. Noted that patient had a bottle of regular Coke and [employer] bag of food on the bedside table when I was talking with him. This patient was last seen by our team on his last admission. Will continue to monitor blood sugars while in the hospital.  Harvel Ricks RN BSN CDE Diabetes Coordinator Pager: [phone]  8am-5pm

## 2017-11-22 NOTE — Progress Notes (Addendum)
PHARMACY - PHYSICIAN COMMUNICATION CRITICAL VALUE ALERT - BLOOD CULTURE IDENTIFICATION (BCID)  Luke [name] is an 37 y.o. male who presented to [facility] on 11/21/2017 with a chief complaint of sepsis  Assessment:  37 yo man with h/o IDDM, polysubstance abuse, osteomyelitis of left foot.  1/3 BC positive for streptococcus sepcies.    Name of physician (or Provider) Contacted: [name] text paged  Current antibiotics: vanc and zosyn  Changes to prescribed antibiotics recommended:  Will change zosyn to rocephin plus flagyl.  This will be less renal toxic, provide better Strep coverage and continued anaerobic coverage.  Pseudomonas coverage likely not needed. Also strep could be contaminant with 1/3 cultures positive. Will f/u with ID in am.  Results for orders placed or performed during the hospital encounter of 11/21/17  Blood Culture ID Panel (Reflexed) (Collected: 11/21/2017  1:30 PM)  Result Value Ref Range   Enterococcus species NOT DETECTED NOT DETECTED   Listeria monocytogenes NOT DETECTED NOT DETECTED   Staphylococcus species NOT DETECTED NOT DETECTED   Staphylococcus aureus NOT DETECTED NOT DETECTED   Streptococcus species DETECTED (A) NOT DETECTED   Streptococcus agalactiae NOT DETECTED NOT DETECTED   Streptococcus pneumoniae NOT DETECTED NOT DETECTED   Streptococcus pyogenes NOT DETECTED NOT DETECTED   Acinetobacter baumannii NOT DETECTED NOT DETECTED   Enterobacteriaceae species NOT DETECTED NOT DETECTED   Enterobacter cloacae complex NOT DETECTED NOT DETECTED   Escherichia coli NOT DETECTED NOT DETECTED   Klebsiella oxytoca NOT DETECTED NOT DETECTED   Klebsiella pneumoniae NOT DETECTED NOT DETECTED   Proteus species NOT DETECTED NOT DETECTED   Serratia marcescens NOT DETECTED NOT DETECTED   Haemophilus influenzae NOT DETECTED NOT DETECTED   Neisseria meningitidis NOT DETECTED NOT DETECTED   Pseudomonas aeruginosa NOT DETECTED NOT DETECTED   Candida albicans NOT DETECTED  NOT DETECTED   Candida glabrata NOT DETECTED NOT DETECTED   Candida krusei NOT DETECTED NOT DETECTED   Candida parapsilosis NOT DETECTED NOT DETECTED   Candida tropicalis NOT DETECTED NOT DETECTED    [name] 11/22/2017  11:06 PM

## 2017-11-22 NOTE — Anesthesia Preprocedure Evaluation (Addendum)
Anesthesia Evaluation    Reviewed: Allergy & Precautions, Patient's Chart, lab work & pertinent test results  History of Anesthesia Complications Negative for: history of anesthetic complications  Airway Mallampati: II  TM Distance: >3 FB Neck ROM: Full    Dental no notable dental hx.    Pulmonary Current Smoker,    Pulmonary exam normal breath sounds clear to auscultation       Cardiovascular hypertension, Pt. on medications Normal cardiovascular exam Rhythm:Regular Rate:Normal     Neuro/Psych  Headaches, Seizures -,  Anxiety    GI/Hepatic GERD  Medicated,(+)     substance abuse (heroin)  cocaine use and IV drug use,   Endo/Other  diabetes, Insulin Dependent Hyponatremia   Renal/GU Renal disease  negative genitourinary   Musculoskeletal negative musculoskeletal ROS (+)   Abdominal   Peds  Hematology  (+) anemia ,  Leukocytosis Thrombocytosis    Anesthesia Other Findings   Reproductive/Obstetrics                                                             Anesthesia Evaluation  Patient identified by MRN, date of birth, ID band Patient awake    Reviewed: Allergy & Precautions, NPO status , Patient's Chart, lab work & pertinent test results  History of Anesthesia Complications Negative for: history of anesthetic complications  Airway Mallampati: II  TM Distance: >3 FB Neck ROM: Full    Dental  (+) Dental Advisory Given, Teeth Intact   Pulmonary Current Smoker,    breath sounds clear to auscultation       Cardiovascular hypertension, Pt. on medications + Valvular Problems/Murmurs  Rhythm:Regular Rate:Normal   '19 TTE - Mild concentric LVH. EF 60% to 65%. Mild AI. LA was mildly dilated. Mild TR, moderate PR. PASP: 30 mm Hg. A trivial pericardial effusion was identified.    Neuro/Psych  Headaches, Seizures -, Well Controlled,  Anxiety    GI/Hepatic GERD   Controlled and Medicated,(+)     substance abuse  cocaine use and IV drug use, Hepatitis -, C  Endo/Other  diabetes, Poorly Controlled, Insulin DependentHypothyroidism   Renal/GU Renal InsufficiencyRenal disease  negative genitourinary   Musculoskeletal negative musculoskeletal ROS (+)   Abdominal   Peds  Hematology  (+) anemia ,   Anesthesia Other Findings Bacteremia  Reproductive/Obstetrics                           Anesthesia Physical Anesthesia Plan  ASA: III  Anesthesia Plan: MAC   Post-op Pain Management:    Induction: Intravenous  PONV Risk Score and Plan: 1 and Propofol infusion and Treatment may vary due to age or medical condition  Airway Management Planned: Nasal Cannula and Natural Airway  Additional Equipment: None  Intra-op Plan:   Post-operative Plan:   Informed Consent: I have reviewed the patients History and Physical, chart, labs and discussed the procedure including the risks, benefits and alternatives for the proposed anesthesia with the patient or authorized representative who has indicated his/her understanding and acceptance.     Plan Discussed with: CRNA and Anesthesiologist  Anesthesia Plan Comments:        Anesthesia Quick Evaluation  Anesthesia Physical Anesthesia Plan  ASA: III  Anesthesia Plan: General   Post-op Pain Management:  Induction: Intravenous  PONV Risk Score and Plan: 2 and Ondansetron and Midazolam  Airway Management Planned: LMA  Additional Equipment: None  Intra-op Plan:   Post-operative Plan: Extubation in OR  Informed Consent:   Plan Discussed with: CRNA and Anesthesiologist  Anesthesia Plan Comments:       Anesthesia Quick Evaluation

## 2017-11-22 NOTE — Progress Notes (Addendum)
[facility] PROGRESS NOTE  Luke [name] PQZ:[id number] DOB: [birth year] DOA: 11/21/2017   PCP: Gildardo Pounds, NP     Assessment/Plan: Principal Problem:   Sepsis due to cellulitis [facility]) Active Problems:   Drug abuse, IV ([place])   Tobacco abuse   DM type 2, uncontrolled, with renal complications ([place])   Essential hypertension   Skin abscess   DKA (diabetic ketoacidoses) ([name])   Diabetic polyneuropathy associated with type 2 diabetes mellitus ([place])   Acute renal failure superimposed on stage 2 chronic kidney disease ([place])   GERD (gastroesophageal reflux disease)    37 y.o. male with a history of IDDM, HTN, polysubstance IV drug use with recent relapse who presented to the ED with bilateral leg wounds worsening over the previous 1 week worst on the left than the right becoming more red and painful, limiting ability to bear weight on left foot associated with chills and malaise. Please also from IV drug injections. Patient is now admitted for possible osteomyelitis of the left foot,, sepsis, DKA  Assessment and plan Sepsis due to cellulitis and abscess of both feet: At sites of injection. Has h/o debridement on left arm for similar presentation  in 2014. - IVF's - Blood cultures - Vancomycin, zosyn for now.    MRI left foot for r/o osteomyelitis. Will need surgical consult (ortho if osteomyelitis, general surgery if no osteo).    DISCUSSED WITH DR [name] , on-call for orthopedics, they will evaluate patient for surgery  If needed ID consult after cultures have resulted   Poorly controlled IDDM with DKA: Worsened by sepsis. - DKA protocol - Isotonic saline until euvolemic - Insulin gtt per protocol,  transition to sq when anion gap is normal, bicarb is improved - Transition to D51/2 NS after blood glucose is <250 -  Unfortunately have not been able to obtain labs, PICC line placed on Lasix and IV medications    Check hemoglobin A1c  -  Due to inability to obtain labs  will obtain ABG  to establish acid-base status pending labs   Polysubstance IV drug use: Relapsed from 15 months sobriety about 3 weeks ago.   - Cessation counseling performed extensively at admission. Pt ready to quit. CSW consulted. Plan is to move away from [place]. -  UDS positive for cocaine and marijuana   AKI on stage II FHL:[id number] 1.95    Due to longstanding and poorly controlled DM worsened now from prerenal injury.  - Give NS bolus now, continue isotonic fluids afterward - Avoid nephrotoxins. CrCl ~58ml/min, so ok w/gadolinium contrast.     HTN:  - Hold lisinopril.  - Prn hydralazine   Elevated TSH: 6.831, marginal elevation, but started on synthroid by PCP. With suspicion for more of a type 1 DM (Dx in early 20's, insulin dependent, in recurrent DKA), there's definitely a possibility of autoimmune thyroid disease.  - Continue synthroid 67mcg.  GERD:  Acid suppression  Diabetic polyneuropathy:  Continue gabapentin  Insomnia:  Continue trazodone    DVT prophylaxsis Lovenox  Code Status:  Full code   Family Communication: Discussed in detail with the patient, all imaging results, lab results explained to the patient   Disposition Plan:   MRI brain /ortho consult pending     Consultants:  Orthopedics      Procedures:     Antibiotics: Anti-infectives (From admission, onward)   Start     Dose/Rate Route Frequency Ordered Stop   11/22/17 1400  piperacillin-tazobactam (  ZOSYN) IVPB 3.375 g     3.375 g 12.5 mL/hr over 240 Minutes Intravenous Every 8 hours 11/22/17 1043     11/22/17 1045  vancomycin (VANCOCIN) IVPB 750 mg/150 ml premix     750 mg 150 mL/hr over 60 Minutes Intravenous Every 12 hours 11/22/17 1043     11/21/17 1830  vancomycin (VANCOCIN) IVPB 750 mg/150 ml premix     750 mg 150 mL/hr over 60 Minutes Intravenous  Once 11/21/17 1816 11/21/17 2030   11/21/17 1715  vancomycin (VANCOCIN) IVPB 1000 mg/200 mL premix     1,000 mg 200  mL/hr over 60 Minutes Intravenous  Once 11/21/17 1710 11/21/17 1914   11/21/17 1715  piperacillin-tazobactam (ZOSYN) IVPB 3.375 g     3.375 g 12.5 mL/hr over 240 Minutes Intravenous  Once 11/21/17 1710 11/21/17 2030         HPI/Subjective: Patient admits to injecting himself in both feet because he could not find a place in advance to inject himself  Objective: Vitals:   11/21/17 2018 11/21/17 2019 11/22/17 0018 11/22/17 0418  BP: 139/77  (!) 114/58 122/61  Pulse: (!) 116  95 91  Resp: 17  19 15   Temp:  99.8 F (37.7 C)    TempSrc:      SpO2: 96%  95% 96%    Intake/Output Summary (Last 24 hours) at 11/22/2017 1213 Last data filed at 11/22/2017 9892 Gross per 24 hour  Intake 2325.27 ml  Output 700 ml  Net 1625.27 ml    Exam:  Examination:  General exam: Appears calm and comfortable  Respiratory system: Clear to auscultation. Respiratory effort normal. Cardiovascular system: S1 & S2 heard, RRR. No JVD, murmurs, rubs, gallops or clicks. No pedal edema. Gastrointestinal system: Abdomen is nondistended, soft and nontender. No organomegaly or masses felt. Normal bowel sounds heard. Central nervous system: Alert and oriented. No focal neurological deficits. Extremities:  Large fluctuant abscesses on the dorsal aspect of the left foot Skin: No rashes, lesions or ulcers Psychiatry: Judgement and insight appear normal. Mood & affect appropriate.     Data Reviewed: I have personally reviewed following labs and imaging studies  Micro Results Recent Results (from the past 240 hour(s))  MRSA PCR Screening     Status: None   Collection Time: 11/21/17  8:19 PM  Result Value Ref Range Status   MRSA by PCR NEGATIVE NEGATIVE Final    Comment:        The GeneXpert MRSA Assay (FDA approved for NASAL specimens only), is one component of a comprehensive MRSA colonization surveillance program. It is not intended to diagnose MRSA infection nor to guide or monitor treatment  for MRSA infections. Performed at [facility], [place] [address] [place], [place] [address]     Radiology Reports Dg Foot Complete Left  Result Date: 11/21/2017 CLINICAL DATA:  Bilateral foot pain for 4-5 days, left-greater-than-right, swelling and open sores, the patient is diabetic, and has a smoking history EXAM: LEFT FOOT - COMPLETE 3+ VIEW COMPARISON:  None. FINDINGS: There is soft tissue swelling over the dorsum of the left foot. However no underlying focal demineralization or erosion is seen to indicate osteomyelitis. No acute abnormality is noted. Joint spaces appear normal. No erosion is seen. IMPRESSION: Soft tissue swelling over the dorsum of the foot. No underlying bony abnormality. Electronically Signed   By: Ivar Drape M.D.   On: 11/21/2017 15:30   Dg Foot Complete Right  Result Date: 11/21/2017 CLINICAL DATA:  Bilateral  foot pain for 4-5 days, left-greater-than-right with swelling and redness, the patient is diabetic and does have a smoking history EXAM: RIGHT FOOT COMPLETE - 3+ VIEW COMPARISON:  None FINDINGS: There is mild soft tissue swelling over the dorsum of the right foot. No underlying bony demineralization or erosion is seen to indicate active osteomyelitis. Alignment is normal and joint spaces appear normal. IMPRESSION: Soft tissue swelling over the dorsum. No evidence of active osteomyelitis. Electronically Signed   By: Ivar Drape M.D.   On: 11/21/2017 15:32   [place] Ekg Site Rite  Result Date: 11/22/2017 If Site Rite image not attached, placement could not be confirmed due to current cardiac rhythm.    CBC Recent Labs  Lab 11/21/17 1332  WBC 24.1*  HGB 11.9*  HCT 36.1*  PLT 560*  MCV 83.8  MCH 27.6  MCHC 33.0  RDW 13.2  LYMPHSABS 3.2  MONOABS 2.3*  EOSABS 0.3  BASOSABS 0.1    Chemistries  Recent Labs  Lab 11/21/17 1332  NA 131*  K 4.5  CL 95*  CO2 19*  GLUCOSE 682*  BUN 19  CREATININE 1.95*  CALCIUM 8.8*  AST 12*  ALT 14  ALKPHOS 128*   BILITOT 1.2   ------------------------------------------------------------------------------------------------------------------ estimated creatinine clearance is 56.6 mL/min (A) (by C-G formula based on SCr of 1.95 mg/dL (H)). ------------------------------------------------------------------------------------------------------------------ No results for input(s): HGBA1C in the last 72 hours. ------------------------------------------------------------------------------------------------------------------ No results for input(s): CHOL, HDL, LDLCALC, TRIG, CHOLHDL, LDLDIRECT in the last 72 hours. ------------------------------------------------------------------------------------------------------------------ No results for input(s): TSH, T4TOTAL, T3FREE, THYROIDAB in the last 72 hours.  Invalid input(s): FREET3 ------------------------------------------------------------------------------------------------------------------ No results for input(s): VITAMINB12, FOLATE, FERRITIN, TIBC, IRON, RETICCTPCT in the last 72 hours.  Coagulation profile No results for input(s): INR, PROTIME in the last 168 hours.  No results for input(s): DDIMER in the last 72 hours.  Cardiac Enzymes No results for input(s): CKMB, TROPONINI, MYOGLOBIN in the last 168 hours.  Invalid input(s): CK ------------------------------------------------------------------------------------------------------------------ Invalid input(s): POCBNP   CBG: Recent Labs  Lab 11/22/17 0616 11/22/17 0719 11/22/17 0824 11/22/17 0922 11/22/17 1039  GLUCAP 131* 171* 219* 221* 261*       Studies: Dg Foot Complete Left  Result Date: 11/21/2017 CLINICAL DATA:  Bilateral foot pain for 4-5 days, left-greater-than-right, swelling and open sores, the patient is diabetic, and has a smoking history EXAM: LEFT FOOT - COMPLETE 3+ VIEW COMPARISON:  None. FINDINGS: There is soft tissue swelling over the dorsum of the left foot.  However no underlying focal demineralization or erosion is seen to indicate osteomyelitis. No acute abnormality is noted. Joint spaces appear normal. No erosion is seen. IMPRESSION: Soft tissue swelling over the dorsum of the foot. No underlying bony abnormality. Electronically Signed   By: Ivar Drape M.D.   On: 11/21/2017 15:30   Dg Foot Complete Right  Result Date: 11/21/2017 CLINICAL DATA:  Bilateral foot pain for 4-5 days, left-greater-than-right with swelling and redness, the patient is diabetic and does have a smoking history EXAM: RIGHT FOOT COMPLETE - 3+ VIEW COMPARISON:  None FINDINGS: There is mild soft tissue swelling over the dorsum of the right foot. No underlying bony demineralization or erosion is seen to indicate active osteomyelitis. Alignment is normal and joint spaces appear normal. IMPRESSION: Soft tissue swelling over the dorsum. No evidence of active osteomyelitis. Electronically Signed   By: Ivar Drape M.D.   On: 11/21/2017 15:32   [place] Ekg Site Rite  Result Date: 11/22/2017 If Site Rite image not attached, placement could  not be confirmed due to current cardiac rhythm.     Lab Results  Component Value Date   HGBA1C 10.4 (A) 10/21/2017   HGBA1C 13.1 05/15/2017   HGBA1C 10.6 (H) 12/17/2016   Lab Results  Component Value Date   MICROALBUR 0.5 12/10/2013   LDLCALC 156 (H) 10/21/2017   CREATININE 1.95 (H) 11/21/2017       Scheduled Meds: . enoxaparin (LOVENOX) injection  40 mg Subcutaneous Q24H  . famotidine  20 mg Oral BID  . gabapentin  400 mg Oral TID  . insulin regular  0-10 Units Intravenous TID WC  . levothyroxine  50 mcg Oral QAC breakfast  . sodium chloride flush  3 mL Intravenous Q12H  . traZODone  50 mg Oral QHS   Continuous Infusions: . 0.9 % NaCl with KCl 40 mEq / L 125 mL/hr (11/22/17 1030)  . dextrose 5 % and 0.45% NaCl Stopped (11/22/17 1030)  . insulin (NOVOLIN-R) infusion 4.8 Units/hr (11/22/17 1041)  . piperacillin-tazobactam (ZOSYN)  IV     . vancomycin       LOS: 1 day    Time spent: >30 MINS    Reyne Dumas  [facility] Pager [phone]. If 7PM-7AM, please contact night-coverage at [email or web address], password [email or web address] 11/22/2017, 12:13 PM  LOS: 1 day

## 2017-11-22 NOTE — Consult Note (Signed)
Reason for Consult:Left foot infection Referring Physician: Ivan Anchors is an 37 y.o. male.  HPI: Luke [name] presented to the hospital yesterday with a 4+ day hx/o left foot swelling, redness, and pain. He believes it got infected when he relapsed and injected himself with cocaine and heroin. He denies fevers but has had chills and fatigue. No previous foot infections but did have an extensive LUE infection, also from IVDU.  Past Medical History:  Diagnosis Date  . Anemia   . Anxiety  Dx 2008  . Diabetes mellitus without complication ([place]) Dx 0630  . DKA (diabetic ketoacidoses) ([place]) 07/21/2017  . GERD (gastroesophageal reflux disease) Dx 2008  . Headache(784.0)   . Pneumonia   . Seizures ([place]) 2011    x 2 in lifetime. on Dilantin for a while.   . 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 I&D EXTREMITY Left 10/11/2012   Procedure: IRRIGATION AND DEBRIDEMENT ABSCESS FOREARM;  Surgeon: Linna Hoff, MD;  Location: [facility];  Service: Orthopedics;  Laterality: Left;  . I&D EXTREMITY Left 10/12/2012   Procedure: IRRIGATION AND DEBRIDEMENT FOREARM;  Surgeon: Linna Hoff, MD;  Location: [facility];  Service: Orthopedics;  Laterality: Left;  . I&D EXTREMITY Left 10/14/2012   Procedure: incision and drainage left forearm;  Surgeon: Linna Hoff, MD;  Location: [facility];  Service: Orthopedics;  Laterality: Left;  . I&D EXTREMITY Left 10/16/2012   Procedure: IRRIGATION AND DEBRIDEMENT LEFT FOREARM;  Surgeon: Linna Hoff, MD;  Location: [facility];  Service: Orthopedics;  Laterality: Left;  . I&D EXTREMITY Left 10/20/2012   Procedure: INCISION AND DRAINAGE AND DEBRIDEMENT LEFT  FOREARM;  Surgeon: Linna Hoff, MD;  Location: [facility];  Service: Orthopedics;  Laterality: Left;  . IRRIGATION AND DEBRIDEMENT ABSCESS     Hx: of left arm abscess related to drug use     Family History  Problem Relation Age of Onset  . Diabetes  Father   . Heart disease Father   . Mental illness Sister   . Cancer Neg Hx     Social History:  reports that he has been smoking cigarettes. He has been smoking about 1.00 pack per day. He has never used smokeless tobacco. He reports that he has current or past drug history. Drugs: Cocaine, IV, and Heroin. He reports that he does not drink alcohol.  Allergies: No Known Allergies  Medications: I have reviewed the patient's current medications.  Results for orders placed or performed during the hospital encounter of 11/21/17 (from the past 48 hour(s))  Urinalysis, Routine w reflex microscopic     Status: Abnormal   Collection Time: 11/21/17  1:20 PM  Result Value Ref Range   Color, Urine STRAW (A) YELLOW   APPearance CLEAR CLEAR   Specific Gravity, Urine 1.027 1.005 - 1.030   pH 6.0 5.0 - 8.0   Glucose, UA >=500 (A) NEGATIVE mg/dL   Hgb urine dipstick MODERATE (A) NEGATIVE   Bilirubin Urine NEGATIVE NEGATIVE   Ketones, ur 20 (A) NEGATIVE mg/dL   Protein, ur 30 (A) NEGATIVE mg/dL   Nitrite NEGATIVE NEGATIVE   Leukocytes, UA NEGATIVE NEGATIVE   RBC / HPF 0-5 0 - 5 RBC/hpf   WBC, UA 0-5 0 - 5 WBC/hpf   Bacteria, UA RARE (A) NONE SEEN    Comment: Performed at [facility], [address] [place], [place]  [address]  Urine rapid drug screen (hosp performed)     Status: Abnormal   Collection Time: 11/21/17  1:20 PM  Result Value Ref Range   Opiates POSITIVE (A) NONE DETECTED   Cocaine POSITIVE (A) NONE DETECTED   Benzodiazepines NONE DETECTED NONE DETECTED   Amphetamines NONE DETECTED NONE DETECTED   Tetrahydrocannabinol NONE DETECTED NONE DETECTED   Barbiturates NONE DETECTED NONE DETECTED    Comment: (NOTE) DRUG SCREEN FOR MEDICAL PURPOSES ONLY.  IF CONFIRMATION IS NEEDED FOR ANY PURPOSE, NOTIFY LAB WITHIN 5 DAYS. LOWEST DETECTABLE LIMITS FOR URINE DRUG SCREEN Drug Class                     Cutoff (ng/mL) Amphetamine and metabolites    1000 Barbiturate and  metabolites    200 Benzodiazepine                 761 Tricyclics and metabolites     300 Opiates and metabolites        300 Cocaine and metabolites        300 THC                            50 Performed at [facility], [place] [address], [place], [place] [address]   Comprehensive metabolic panel     Status: Abnormal   Collection Time: 11/21/17  1:32 PM  Result Value Ref Range   Sodium 131 (L) 135 - 145 mmol/L   Potassium 4.5 3.5 - 5.1 mmol/L   Chloride 95 (L) 98 - 111 mmol/L   CO2 19 (L) 22 - 32 mmol/L   Glucose, Bld 682 (HH) 70 - 99 mg/dL    Comment: CRITICAL RESULT CALLED TO, READ BACK BY AND VERIFIED WITH: FUNK,B RN @ 1505 11/21/17 LEONARD,A    BUN 19 6 - 20 mg/dL   Creatinine, Ser 1.95 (H) 0.61 - 1.24 mg/dL   Calcium 8.8 (L) 8.9 - 10.3 mg/dL   Total Protein 6.9 6.5 - 8.1 g/dL   Albumin 2.9 (L) 3.5 - 5.0 g/dL   AST 12 (L) 15 - 41 U/L   ALT 14 0 - 44 U/L   Alkaline Phosphatase 128 (H) 38 - 126 U/L   Total Bilirubin 1.2 0.3 - 1.2 mg/dL   GFR calc non Af Amer 42 (L) >60 mL/min   GFR calc Af Amer 49 (L) >60 mL/min    Comment: (NOTE) The eGFR has been calculated using the CKD EPI equation. This calculation has not been validated in all clinical situations. eGFR's persistently <60 mL/min signify possible Chronic Kidney Disease.    Anion gap 17 (H) 5 - 15    Comment: Performed at [facility], [place] [address] [place], [place] [address]  CBC WITH DIFFERENTIAL     Status: Abnormal   Collection Time: 11/21/17  1:32 PM  Result Value Ref Range   WBC 24.1 (H) 4.0 - 10.5 K/uL   RBC 4.31 4.22 - 5.81 MIL/uL   Hemoglobin 11.9 (L) 13.0 - 17.0 g/dL   HCT 36.1 (L) 39.0 - 52.0 %   MCV 83.8 78.0 - 100.0 fL   MCH 27.6 26.0 - 34.0 pg   MCHC 33.0 30.0 - 36.0 g/dL   RDW 13.2 11.5 - 15.5 %   Platelets 560 (H) 150 - 400 K/uL   Neutrophils Relative % 75 %   Neutro Abs 18.0 (H) 1.7 - 7.7 K/uL  Lymphocytes Relative 13 %   Lymphs Abs 3.2 0.7 - 4.0 K/uL   Monocytes Relative 9 %    Monocytes Absolute 2.3 (H) 0.1 - 1.0 K/uL   Eosinophils Relative 1 %   Eosinophils Absolute 0.3 0.0 - 0.7 K/uL   Basophils Relative 1 %   Basophils Absolute 0.1 0.0 - 0.1 K/uL   Immature Granulocytes 1 %   Abs Immature Granulocytes 0.2 (H) 0.0 - 0.1 K/uL    Comment: Performed at [facility] [address], [place],  [address]  I-Stat CG4 Lactic Acid, ED  (not at  [facility])     Status: None   Collection Time: 11/21/17  1:49 PM  Result Value Ref Range   Lactic Acid, Venous 1.22 0.5 - 1.9 mmol/L  CG4 I-STAT (Lactic acid)     Status: None   Collection Time: 11/21/17  7:57 PM  Result Value Ref Range   Lactic Acid, Venous 1.77 0.5 - 1.9 mmol/L  Glucose, capillary     Status: Abnormal   Collection Time: 11/21/17  8:07 PM  Result Value Ref Range   Glucose-Capillary 519 (HH) 70 - 99 mg/dL  MRSA PCR Screening     Status: None   Collection Time: 11/21/17  8:19 PM  Result Value Ref Range   MRSA by PCR NEGATIVE NEGATIVE    Comment:        The GeneXpert MRSA Assay (FDA approved for NASAL specimens only), is one component of a comprehensive MRSA colonization surveillance program. It is not intended to diagnose MRSA infection nor to guide or monitor treatment for MRSA infections. Performed at [facility], [place] [address] [place], [place] [address]   Glucose, capillary     Status: Abnormal   Collection Time: 11/21/17  8:56 PM  Result Value Ref Range   Glucose-Capillary >600 (HH) 70 - 99 mg/dL  Glucose, capillary     Status: Abnormal   Collection Time: 11/21/17 10:07 PM  Result Value Ref Range   Glucose-Capillary 487 (H) 70 - 99 mg/dL  Glucose, capillary     Status: Abnormal   Collection Time: 11/21/17 11:04 PM  Result Value Ref Range   Glucose-Capillary 378 (H) 70 - 99 mg/dL  Glucose, capillary     Status: Abnormal   Collection Time: 11/22/17 12:12 AM  Result Value Ref Range   Glucose-Capillary 341 (H) 70 - 99 mg/dL  Glucose, capillary     Status: Abnormal    Collection Time: 11/22/17  1:16 AM  Result Value Ref Range   Glucose-Capillary 251 (H) 70 - 99 mg/dL  Glucose, capillary     Status: Abnormal   Collection Time: 11/22/17  2:14 AM  Result Value Ref Range   Glucose-Capillary 213 (H) 70 - 99 mg/dL  Glucose, capillary     Status: Abnormal   Collection Time: 11/22/17  3:16 AM  Result Value Ref Range   Glucose-Capillary 170 (H) 70 - 99 mg/dL  Glucose, capillary     Status: Abnormal   Collection Time: 11/22/17  4:15 AM  Result Value Ref Range   Glucose-Capillary 142 (H) 70 - 99 mg/dL  Glucose, capillary     Status: Abnormal   Collection Time: 11/22/17  5:13 AM  Result Value Ref Range   Glucose-Capillary 137 (H) 70 - 99 mg/dL  Glucose, capillary     Status: Abnormal   Collection Time: 11/22/17  6:16 AM  Result Value Ref Range   Glucose-Capillary 131 (H) 70 - 99 mg/dL  Glucose, capillary     Status: Abnormal   Collection Time: 11/22/17  7:19 AM  Result Value Ref Range   Glucose-Capillary 171 (H) 70 - 99 mg/dL  Glucose, capillary     Status: Abnormal   Collection Time: 11/22/17  8:24 AM  Result Value Ref Range   Glucose-Capillary 219 (H) 70 - 99 mg/dL  Glucose, capillary     Status: Abnormal   Collection Time: 11/22/17  9:22 AM  Result Value Ref Range   Glucose-Capillary 221 (H) 70 - 99 mg/dL  Glucose, capillary     Status: Abnormal   Collection Time: 11/22/17 10:39 AM  Result Value Ref Range   Glucose-Capillary 261 (H) 70 - 99 mg/dL  Glucose, capillary     Status: Abnormal   Collection Time: 11/22/17  1:28 PM  Result Value Ref Range   Glucose-Capillary 139 (H) 70 - 99 mg/dL    Mr Foot Left W Wo Contrast  Result Date: 11/22/2017 CLINICAL DATA:  IV drug abuser who injects in his feet. Bilateral foot wounds for 1 week. Chills and malaise. EXAM: MRI OF THE LEFT FOREFOOT WITHOUT AND WITH CONTRAST TECHNIQUE: Multiplanar, multisequence MR imaging of the left forefoot was performed both before and after administration of intravenous  contrast. CONTRAST:  8.5 cc Gadavist IV COMPARISON:  Plain films left foot 11/21/2017. FINDINGS: Bones/Joint/Cartilage There is partial visualization of marrow edema in the medial malleolus with postcontrast enhancement. Bone marrow signal is otherwise normal. Ligaments Intact Muscles and Tendons No intramuscular fluid collection is identified. Soft tissues Diffuse subcutaneous edema enhancement are seen about the foot. There is an abscess in the dorsal subcutaneous tissues centered over the bases of the first through third metatarsals. The abscess measures 1.8 cm craniocaudal by 4.6 cm transverse by 4.4 cm long. A second abscess in the subcutaneous tissues measuring 2.5 cm long by 2.3 cm craniocaudal by 0.9 cm transverse is identified along the proximal first metatarsal. This abscess is centered 1.5 cm distal to the first tarsometatarsal joint. A third abscess which is incompletely visualized is seen in subcutaneous tissues 3.2 cm superior to the tip of the medial malleolus. This collection measures approximately 1.5 cm craniocaudal by 0.2 cm transverse. IMPRESSION: Three subcutaneous abscesses are identified as described above. The largest collection is in the dorsal soft tissues at the level of the proximal first and third metatarsals. Edema and enhancement the medial malleolus consistent with osteomyelitis. Electronically Signed   By: Inge Rise M.D.   On: 11/22/2017 13:01   Dg Foot Complete Left  Result Date: 11/21/2017 CLINICAL DATA:  Bilateral foot pain for 4-5 days, left-greater-than-right, swelling and open sores, the patient is diabetic, and has a smoking history EXAM: LEFT FOOT - COMPLETE 3+ VIEW COMPARISON:  None. FINDINGS: There is soft tissue swelling over the dorsum of the left foot. However no underlying focal demineralization or erosion is seen to indicate osteomyelitis. No acute abnormality is noted. Joint spaces appear normal. No erosion is seen. IMPRESSION: Soft tissue swelling over the  dorsum of the foot. No underlying bony abnormality. Electronically Signed   By: Ivar Drape M.D.   On: 11/21/2017 15:30   Dg Foot Complete Right  Result Date: 11/21/2017 CLINICAL DATA:  Bilateral foot pain for 4-5 days, left-greater-than-right with swelling and redness, the patient is diabetic and does have a smoking history EXAM: RIGHT FOOT COMPLETE - 3+ VIEW COMPARISON:  None FINDINGS: There is mild soft tissue swelling over the dorsum of the right foot. No underlying bony demineralization  or erosion is seen to indicate active osteomyelitis. Alignment is normal and joint spaces appear normal. IMPRESSION: Soft tissue swelling over the dorsum. No evidence of active osteomyelitis. Electronically Signed   By: Ivar Drape M.D.   On: 11/21/2017 15:32   [place] Ekg Site Rite  Result Date: 11/22/2017 If Site Rite image not attached, placement could not be confirmed due to current cardiac rhythm.   Review of Systems  Constitutional: Positive for chills and malaise/fatigue. Negative for fever and weight loss.  HENT: Negative for ear discharge, ear pain, hearing loss and tinnitus.   Eyes: Negative for blurred vision, double vision, photophobia and pain.  Respiratory: Negative for cough, sputum production and shortness of breath.   Cardiovascular: Negative for chest pain.  Gastrointestinal: Negative for abdominal pain, nausea and vomiting.  Genitourinary: Negative for dysuria, flank pain, frequency and urgency.  Musculoskeletal: Positive for joint pain (Left foot). Negative for back pain, falls, myalgias and neck pain.  Neurological: Negative for dizziness, tingling, sensory change, focal weakness, loss of consciousness and headaches.  Endo/Heme/Allergies: Does not bruise/bleed easily.  Psychiatric/Behavioral: Negative for depression, memory loss and substance abuse. The patient is not nervous/anxious.    Blood pressure 122/61, pulse 91, temperature 99.8 F (37.7 C), resp. rate 15, SpO2 96 %. Physical Exam   Constitutional: He appears well-developed and well-nourished. No distress.  HENT:  Head: Normocephalic and atraumatic.  Eyes: Conjunctivae are normal. Right eye exhibits no discharge. Left eye exhibits no discharge. No scleral icterus.  Neck: Normal range of motion.  Cardiovascular: Normal rate and regular rhythm.  Respiratory: Effort normal. No respiratory distress.  Musculoskeletal:  LLE No traumatic wounds, ecchymosis, or rash  Edema, ulcerations, purulent discharge foot/medial ankle  No knee or ankle effusion  Knee stable to varus/ valgus and anterior/posterior stress  Sens DPN, SPN, TN intact  Motor EHL, ext, flex, evers 5/5  DP 0, PT 0 (suspect edema precludes palpation as contralateral pulses are normal), No significant edema  Neurological: He is alert.  Skin: Skin is warm and dry. He is not diaphoretic.  Psychiatric: He has a normal mood and affect. His behavior is normal.    Assessment/Plan: Left foot abscesses/medial malleolus osteomyelitis -- Pt categorically against BKA at this point. Given likely proximate cause of infection (IVDU) I think it makes sense to give him a try to see if he can clear this. Would suggest ID consult as he will need long term abx treatment. I, of course, encouraged him to cease all injectable drug use as well as nicotine use. He will need good DM control. His abscesses are too diverse to drain at bedside, Dr. [name] will take pt to OR in AM for I&D. NPO after MN.    Lisette Abu, PA-C Orthopedic Surgery [phone] 11/22/2017, 1:34 PM

## 2017-11-22 NOTE — Progress Notes (Signed)
Results for Luke [name], Luke [name] (MRN [id number]) as of 11/22/2017 11:40  Ref. Range 11/22/2017 06:16 11/22/2017 07:19 11/22/2017 08:24 11/22/2017 09:22 11/22/2017 10:39  Glucose-Capillary Latest Ref Range: 70 - 99 mg/dL 131 (H) 171 (H) 219 (H) 221 (H) 261 (H)   Inpatient Diabetes Program Recommendations  AACE/ADA: New Consensus Statement on Inpatient Glycemic Control (2015)  Target Ranges:  Prepandial:   less than 140 mg/dL      Peak postprandial:   less than 180 mg/dL (1-2 hours)      Critically ill patients:  140 - 180 mg/dL   Lab Results  Component Value Date   GLUCAP 261 (H) 11/22/2017   HGBA1C 10.4 (A) 10/21/2017    Review of Glycemic Control  Diabetes history: Type 1 Outpatient Diabetes medications: 70/30 30 units BID Current orders for Inpatient glycemic control: IV insulin  Inpatient Diabetes Program Recommendations:   Recommend starting home dose of 70/30 insulin 30 units BID when transitioning off IV insulin drip and eating.  Add Novolog SENSITIVE 0-9 units every 4 hours while NPO and then TID & HS when eating. Will continue to monitor blood sugars while in the hospital.  Harvel Ricks RN BSN CDE Diabetes Coordinator Pager: [phone]  8am-5pm

## 2017-11-22 NOTE — Plan of Care (Signed)

## 2017-11-23 ENCOUNTER — Encounter (HOSPITAL_COMMUNITY)
Admission: EM | Disposition: A | Discharge status: Home / Self Care | Payer: Self-pay | Source: Home / Self Care | Attending: Internal Medicine

## 2017-11-23 ENCOUNTER — Other Ambulatory Visit: Payer: Self-pay

## 2017-11-23 ENCOUNTER — Encounter (HOSPITAL_COMMUNITY): Payer: Self-pay | Admitting: Certified Registered"

## 2017-11-23 DIAGNOSIS — L02612 Cutaneous abscess of left foot: Secondary | ICD-10-CM

## 2017-11-23 DIAGNOSIS — Z794 Long term (current) use of insulin: Secondary | ICD-10-CM

## 2017-11-23 DIAGNOSIS — F1410 Cocaine abuse, uncomplicated: Secondary | ICD-10-CM

## 2017-11-23 DIAGNOSIS — F1110 Opioid abuse, uncomplicated: Secondary | ICD-10-CM

## 2017-11-23 DIAGNOSIS — E1169 Type 2 diabetes mellitus with other specified complication: Secondary | ICD-10-CM

## 2017-11-23 DIAGNOSIS — F17210 Nicotine dependence, cigarettes, uncomplicated: Secondary | ICD-10-CM

## 2017-11-23 DIAGNOSIS — M869 Osteomyelitis, unspecified: Secondary | ICD-10-CM

## 2017-11-23 LAB — COMPREHENSIVE METABOLIC PANEL
ALBUMIN: 2.1 g/dL — AB (ref 3.5–5.0)
ALT: 11 U/L (ref 0–44)
ANION GAP: 8 (ref 5–15)
AST: 16 U/L (ref 15–41)
Alkaline Phosphatase: 88 U/L (ref 38–126)
BILIRUBIN TOTAL: 0.4 mg/dL (ref 0.3–1.2)
BUN: 10 mg/dL (ref 6–20)
CHLORIDE: 107 mmol/L (ref 98–111)
CO2: 20 mmol/L — ABNORMAL LOW (ref 22–32)
Calcium: 8.2 mg/dL — ABNORMAL LOW (ref 8.9–10.3)
Creatinine, Ser: 1.31 mg/dL — ABNORMAL HIGH (ref 0.61–1.24)
GFR calc Af Amer: >60 mL/min (ref >60)
GFR calc non Af Amer: >60 mL/min (ref >60)
GLUCOSE: 400 mg/dL — AB (ref 70–99)
POTASSIUM: 4.5 mmol/L (ref 3.5–5.1)
SODIUM: 135 mmol/L (ref 135–145)
TOTAL PROTEIN: 5.5 g/dL — AB (ref 6.5–8.1)

## 2017-11-23 LAB — CBC
HEMATOCRIT: 28.4 % — AB (ref 39.0–52.0)
HEMOGLOBIN: 9.4 g/dL — AB (ref 13.0–17.0)
MCH: 27.2 pg (ref 26.0–34.0)
MCHC: 33.1 g/dL (ref 30.0–36.0)
MCV: 82.1 fL (ref 78.0–100.0)
Platelets: 426 10*3/uL — ABNORMAL HIGH (ref 150–400)
RBC: 3.46 MIL/uL — AB (ref 4.22–5.81)
RDW: 12.9 % (ref 11.5–15.5)
WBC: 16.9 10*3/uL — ABNORMAL HIGH (ref 4.0–10.5)

## 2017-11-23 LAB — GLUCOSE, CAPILLARY
GLUCOSE-CAPILLARY: 434 mg/dL — AB (ref 70–99)
GLUCOSE-CAPILLARY: 340 mg/dL — AB (ref 70–99)
GLUCOSE-CAPILLARY: 148 mg/dL — AB (ref 70–99)
Glucose-Capillary: 409 mg/dL — ABNORMAL HIGH (ref 70–99)
Glucose-Capillary: 399 mg/dL — ABNORMAL HIGH (ref 70–99)

## 2017-11-23 LAB — C-REACTIVE PROTEIN: CRP: 9 mg/dL — ABNORMAL HIGH (ref <1.0)

## 2017-11-23 SURGERY — IRRIGATION AND DEBRIDEMENT EXTREMITY
Anesthesia: Choice | Laterality: Left

## 2017-11-23 MED ORDER — INSULIN ASPART PROT & ASPART (70-30 MIX) 100 UNIT/ML ~~LOC~~ SUSP
45.0000 [IU] | Freq: Two times a day (BID) | SUBCUTANEOUS | Status: DC
  Administered 2017-11-23 – 2017-11-24 (×2): 45 [IU] via SUBCUTANEOUS

## 2017-11-23 MED ORDER — INSULIN ASPART 100 UNIT/ML ~~LOC~~ SOLN
0.0000 [IU] | Freq: Three times a day (TID) | SUBCUTANEOUS | Status: DC
  Administered 2017-11-23: 15 [IU] via SUBCUTANEOUS
  Administered 2017-11-23 – 2017-11-24 (×2): 11 [IU] via SUBCUTANEOUS

## 2017-11-23 MED ORDER — INSULIN ASPART 100 UNIT/ML ~~LOC~~ SOLN
10.0000 [IU] | Freq: Once | SUBCUTANEOUS | Status: AC
  Administered 2017-11-23: 10 [IU] via INTRAVENOUS

## 2017-11-23 MED ORDER — MIDAZOLAM HCL 2 MG/2ML IJ SOLN
INTRAMUSCULAR | Status: AC
  Filled 2017-11-23: qty 2

## 2017-11-23 MED ORDER — POVIDONE-IODINE 10 % EX SWAB: 2.0000 "application " | Freq: Once | CUTANEOUS | Status: DC

## 2017-11-23 MED ORDER — FENTANYL CITRATE (PF) 250 MCG/5ML IJ SOLN
INTRAMUSCULAR | Status: AC
  Filled 2017-11-23: qty 5

## 2017-11-23 MED ORDER — PROPOFOL 10 MG/ML IV BOLUS
INTRAVENOUS | Status: AC
  Filled 2017-11-23: qty 20

## 2017-11-23 MED ORDER — ONDANSETRON HCL 4 MG/2ML IJ SOLN
INTRAMUSCULAR | Status: AC
  Filled 2017-11-23: qty 2

## 2017-11-23 MED ORDER — PIPERACILLIN-TAZOBACTAM 3.375 G IVPB
3.3750 g | Freq: Three times a day (TID) | INTRAVENOUS | Status: DC
  Administered 2017-11-23 – 2017-11-25 (×6): 3.375 g via INTRAVENOUS
  Filled 2017-11-23 (×6): qty 50

## 2017-11-23 MED ORDER — CEFAZOLIN SODIUM-DEXTROSE 2-4 GM/100ML-% IV SOLN: 2.0000 g | INTRAVENOUS | Status: AC

## 2017-11-23 MED ORDER — DIPHENHYDRAMINE HCL 50 MG/ML IJ SOLN
INTRAMUSCULAR | Status: AC
  Filled 2017-11-23: qty 1

## 2017-11-23 MED ORDER — INSULIN ASPART 100 UNIT/ML ~~LOC~~ SOLN: 0.0000 [IU] | Freq: Every day | SUBCUTANEOUS | Status: DC

## 2017-11-23 MED ORDER — CHLORHEXIDINE GLUCONATE 4 % EX LIQD
60.0000 mL | Freq: Once | CUTANEOUS | Status: AC
  Administered 2017-11-24: 4 via TOPICAL
  Filled 2017-11-23: qty 60

## 2017-11-23 NOTE — Progress Notes (Signed)
Due to patient's blood sugar anesthesia has recommended delaying the case till tomorrow.  We will rely on medicine for tight glycemic control so that we will be ready for tomorrow.

## 2017-11-23 NOTE — Progress Notes (Deleted)
Offered a CHG bath prior to surgery but pt refused. Pt reported to this nurse that he wanted to have some sleep

## 2017-11-23 NOTE — Progress Notes (Signed)
Show:Clear all [x] Manual[] Template[] Copied  Added by: [] Aylssa Herrig, Adrienne Mocha, RN  [] Hover for details Offered a CHG bath prior to surgery but pt refused. Pt reported to this nurse that he wanted to have some sleep

## 2017-11-23 NOTE — Progress Notes (Signed)
ANTIBIOTIC CONSULT NOTE - INITIAL  Pharmacy Consult for Zosyn Indication: Wound infection  No Known Allergies  Patient Measurements: Height: 5\' 9"  (175.3 cm) Weight: 191 lb 6.4 oz (86.8 kg) IBW/kg (Calculated) : 70.7   Vital Signs: Temp: 98.5 F (36.9 C) (09/07 0819) Temp Source: Oral (09/07 0819) BP: 150/78 (09/07 0820) Pulse Rate: 89 (09/07 0820) Intake/Output from previous day: 09/06 0701 - 09/07 0700 In: -  Out: 2000 [Urine:2000] Intake/Output from this shift: Total I/O In: -  Out: 300 [Urine:300]  Labs: Recent Labs    11/21/17 1332 11/22/17 1750 11/23/17 0410  WBC 24.1* 19.5* 16.9*  HGB 11.9* 9.3* 9.4*  PLT 560* 453* 426*  CREATININE 1.95* 1.16 1.31*   Estimated Creatinine Clearance: 84.2 mL/min (A) (by C-G formula based on SCr of 1.31 mg/dL (H)). No results for input(s): VANCOTROUGH, VANCOPEAK, VANCORANDOM, GENTTROUGH, GENTPEAK, GENTRANDOM, TOBRATROUGH, TOBRAPEAK, TOBRARND, AMIKACINPEAK, AMIKACINTROU, AMIKACIN in the last 72 hours.   Microbiology: Recent Results (from the past 720 hour(s))  Blood Culture (routine x 2)     Status: None (Preliminary result)   Collection Time: 11/21/17  1:30 PM  Result Value Ref Range Status   Specimen Description BLOOD RIGHT HAND  Final   Special Requests   Final    BOTTLES DRAWN AEROBIC AND ANAEROBIC Blood Culture results may not be optimal due to an excessive volume of blood received in culture bottles   Culture  Setup Time   Final    GRAM POSITIVE COCCI AEROBIC BOTTLE ONLY CRITICAL RESULT CALLED TO, READ BACK BY AND VERIFIED WITH: PHARMD L SEAY 11/22/17 AT 2226 BY CM Performed at [facility], [place] [address] [place], [place] [address]    Culture GRAM POSITIVE COCCI  Final   Report Status PENDING  Incomplete  Blood Culture ID Panel (Reflexed)     Status: Abnormal   Collection Time: 11/21/17  1:30 PM  Result Value Ref Range Status   Enterococcus species NOT DETECTED NOT DETECTED Final   Listeria monocytogenes  NOT DETECTED NOT DETECTED Final   Staphylococcus species NOT DETECTED NOT DETECTED Final   Staphylococcus aureus NOT DETECTED NOT DETECTED Final   Streptococcus species DETECTED (A) NOT DETECTED Final    Comment: Not Enterococcus species, Streptococcus agalactiae, Streptococcus pyogenes, or Streptococcus pneumoniae. CRITICAL RESULT CALLED TO, READ BACK BY AND VERIFIED WITH: PHARMD L SEAY 11/22/17 AT 2227 BY CM    Streptococcus agalactiae NOT DETECTED NOT DETECTED Final   Streptococcus pneumoniae NOT DETECTED NOT DETECTED Final   Streptococcus pyogenes NOT DETECTED NOT DETECTED Final   Acinetobacter baumannii NOT DETECTED NOT DETECTED Final   Enterobacteriaceae species NOT DETECTED NOT DETECTED Final   Enterobacter cloacae complex NOT DETECTED NOT DETECTED Final   Escherichia coli NOT DETECTED NOT DETECTED Final   Klebsiella oxytoca NOT DETECTED NOT DETECTED Final   Klebsiella pneumoniae NOT DETECTED NOT DETECTED Final   Proteus species NOT DETECTED NOT DETECTED Final   Serratia marcescens NOT DETECTED NOT DETECTED Final   Haemophilus influenzae NOT DETECTED NOT DETECTED Final   Neisseria meningitidis NOT DETECTED NOT DETECTED Final   Pseudomonas aeruginosa NOT DETECTED NOT DETECTED Final   Candida albicans NOT DETECTED NOT DETECTED Final   Candida glabrata NOT DETECTED NOT DETECTED Final   Candida krusei NOT DETECTED NOT DETECTED Final   Candida parapsilosis NOT DETECTED NOT DETECTED Final   Candida tropicalis NOT DETECTED NOT DETECTED Final    Comment: Performed at [facility], [place] [address] [place], [place] [address]  Blood Culture (routine x 2)     Status: None (Preliminary result)   Collection Time: 11/21/17  8:10 PM  Result Value Ref Range Status   Specimen Description BLOOD SITE NOT SPECIFIED  Final   Special Requests   Final    BOTTLES DRAWN AEROBIC ONLY Blood Culture results may not be optimal due to an inadequate volume of blood received in culture bottles   Culture    Final    NO GROWTH < 24 HOURS Performed at [facility], [address] [place], [place] [address]    Report Status PENDING  Incomplete  MRSA PCR Screening     Status: None   Collection Time: 11/21/17  8:19 PM  Result Value Ref Range Status   MRSA by PCR NEGATIVE NEGATIVE Final    Comment:        The GeneXpert MRSA Assay (FDA approved for NASAL specimens only), is one component of a comprehensive MRSA colonization surveillance program. It is not intended to diagnose MRSA infection nor to guide or monitor treatment for MRSA infections. Performed at [facility], [place] [address], [place], [place] [address]     Medical History: Past Medical History:  Diagnosis Date  . Anemia   . Anxiety  Dx 2008  . Diabetes mellitus without complication ([place]) Dx 7867  . DKA (diabetic ketoacidoses) ([place]) 07/21/2017  . GERD (gastroesophageal reflux disease) Dx 2008  . Headache(784.0)   . Pneumonia   . Seizures ([place]) 2011    x 2 in lifetime. on Dilantin for a while.   . Substance abuse ([place]) 2013    heroin use, multiple relapses   Assessment: 37 y.o. male who presented to [facility] on 11/21/2017 with a chief complaint of sepsis from LE wounds. Patient growing strep in 1 of 3 blood cultures. Ceftriaxone and metronidazole changed to Zosyn per MD. Patient also on Vancomycin.   Plan:  Zosyn 3.375GM IV q8h (4 hour infusions)  Dorthy Magnussen A. Levada Dy, PharmD, [place] Pager: [phone] Please utilize Amion for appropriate phone number to reach the unit pharmacist ([place])   11/23/2017,10:32 AM

## 2017-11-23 NOTE — Progress Notes (Addendum)
Dr. [name] notified of CBG and that patient is Type 1 DM. Will notify oncoming anesthesiologist of CBG.

## 2017-11-23 NOTE — Progress Notes (Signed)
PROGRESS NOTE    Luke [name]  WUJ:[id number] DOB: 02/13/1981 DOA: 11/21/2017 PCP: Gildardo Pounds, NP   Brief Narrative: Patient is a 37 year old male with past medical history of IV drug abuse with recent relapse, insulin-dependent diabetes, hypertension who presented to the emergency department with complaints of worsening wound on the left foot.  Patient was injecting cocaine/heroine on the left foot.  Currently being managed for osteomyelitis of the left foot, sepsis,DKA.  Orthopedics following for I&D plan of the left foot.  ID will be consulted for antibiotics management.  Assessment & Plan:   Principal Problem:   Sepsis due to cellulitis [facility]) Active Problems:   Drug abuse, IV ([place])   Tobacco abuse   DM type 2, uncontrolled, with renal complications ([place])   Essential hypertension   Skin abscess   DKA (diabetic ketoacidoses) ([name])   Diabetic polyneuropathy associated with type 2 diabetes mellitus ([place])   Acute renal failure superimposed on stage 2 chronic kidney disease ([name])   GERD (gastroesophageal reflux disease)  Sepsis/osteomyelitis of the left foot :Sepsis due to cellulitis and abscess of both feet: At sites of injection of IV drugs.  h/o debridement on left arm for similar presentation in 2014.  MRI left foot suggestive of osteomyelitis.  Currently on vancomycin and Zosyn. Ortho planning for I&D tomorrow.  ID consulted One of the BCID showing Streptococcus species  Poorly controlled IDDM with NFA:OZHYQMV on insulin drip which has been stopped.  Currently on insulin 70/30.  Dose increased to 45 units twice daily.  Continue sliding scale insulin.   Hemoglobin A1c of 12.1.  Continue to monitor blood sugars.  Polysubstance IV drug use: Relapsed from 15 months sobriety about 3 weeks ago. Injects cocaine/heroin Cessation counseling performed extensively at admission. Pt ready to quit. CSW consulted. Plan is to move away from [place]. UDS positive for cocaine and  marijuana   AKI on stage II CKD:Cr 1.31 CKD due to longstanding and poorly controlled DM .Avoid nephrotoxins.    HTN:  Hold lisinopril.  Prn hydralazine  Elevated TSH: 6.831, marginal elevation, but started on synthroid by PCP. -Continue synthroid 4mcg.  GERD:  Continue PPI  Diabetic polyneuropathy:  Continue gabapentin  Insomnia:  Continue trazodone    DVT prophylaxis: Lovenox Code Status: Full Family Communication: Wife present at the bedside  disposition Plan: Ready for discharge   Consultants: Orthopedics, ID  Procedures: None  Antimicrobials: Vancomycin, Zosyn  Subjective: Patient seen and examined the bedside this morning.  Currently afebrile.  Hemodynamically stable.  Swelling on the left foot brusted when he went to the bathroom.  Denies any new complaints  Objective: Vitals:   11/23/17 0409 11/23/17 0431 11/23/17 0819 11/23/17 0820  BP:  124/87  (!) 150/78  Pulse:  87  89  Resp:  16  16  Temp: 98.7 F (37.1 C) 98.7 F (37.1 C) 98.5 F (36.9 C)   TempSrc: Oral Oral Oral   SpO2:  98%  98%  Weight:      Height:        Intake/Output Summary (Last 24 hours) at 11/23/2017 1105 Last data filed at 11/23/2017 7846 Gross per 24 hour  Intake -  Output 1600 ml  Net -1600 ml   Filed Weights   11/23/17 0227  Weight: 86.8 kg    Examination:  General exam: Appears calm and comfortable ,Not in distress,average built HEENT:PERRL,Oral mucosa moist, Ear/Nose normal on gross exam Respiratory system: Bilateral equal air entry, normal vesicular breath sounds, no wheezes or  crackles  Cardiovascular system: S1 & S2 heard, RRR. No JVD, murmurs, rubs, gallops or clicks. No pedal edema. Gastrointestinal system: Abdomen is nondistended, soft and nontender. No organomegaly or masses felt. Normal bowel sounds heard. Central nervous system: Alert and oriented. No focal neurological deficits. Extremities: Large fluctuant abscess on the dorsal aspect of the left  foot.PICC Line on the right arm skin:  Old surgical scars on bilateral extremities  MSK: Normal muscle bulk,tone ,power Psychiatry: Judgement and insight appear normal. Mood & affect appropriate.     Data Reviewed: I have personally reviewed following labs and imaging studies  CBC: Recent Labs  Lab 11/21/17 1332 11/22/17 1750 11/23/17 0410  WBC 24.1* 19.5* 16.9*  NEUTROABS 18.0*  --   --   HGB 11.9* 9.3* 9.4*  HCT 36.1* 28.9* 28.4*  MCV 83.8 83.5 82.1  PLT 560* 453* 213*   Basic Metabolic Panel: Recent Labs  Lab 11/21/17 1332 11/22/17 1750 11/23/17 0410  NA 131* 138 135  K 4.5 3.6 4.5  CL 95* 107 107  CO2 19* 23 20*  GLUCOSE 682* 169* 400*  BUN 19 11 10   CREATININE 1.95* 1.16 1.31*  CALCIUM 8.8* 8.2* 8.2*   GFR: Estimated Creatinine Clearance: 84.2 mL/min (A) (by C-G formula based on SCr of 1.31 mg/dL (H)). Liver Function Tests: Recent Labs  Lab 11/21/17 1332 11/22/17 1750 11/23/17 0410  AST 12* 15 16  ALT 14 11 11   ALKPHOS 128* 94 88  BILITOT 1.2 0.7 0.4  PROT 6.9 5.5* 5.5*  ALBUMIN 2.9* 2.2* 2.1*   No results for input(s): LIPASE, AMYLASE in the last 168 hours. No results for input(s): AMMONIA in the last 168 hours. Coagulation Profile: No results for input(s): INR, PROTIME in the last 168 hours. Cardiac Enzymes: No results for input(s): CKTOTAL, CKMB, CKMBINDEX, TROPONINI in the last 168 hours. BNP (last 3 results) No results for input(s): PROBNP in the last 8760 hours. HbA1C: Recent Labs    11/22/17 1750  HGBA1C 12.1*   CBG: Recent Labs  Lab 11/22/17 1454 11/22/17 1756 11/22/17 2200 11/23/17 0610 11/23/17 0816  GLUCAP 160* 173* 268* 409* 434*   Lipid Profile: No results for input(s): CHOL, HDL, LDLCALC, TRIG, CHOLHDL, LDLDIRECT in the last 72 hours. Thyroid Function Tests: No results for input(s): TSH, T4TOTAL, FREET4, T3FREE, THYROIDAB in the last 72 hours. Anemia Panel: No results for input(s): VITAMINB12, FOLATE, FERRITIN, TIBC,  IRON, RETICCTPCT in the last 72 hours. Sepsis Labs: Recent Labs  Lab 11/21/17 1349 11/21/17 1957  LATICACIDVEN 1.22 1.77    Recent Results (from the past 240 hour(s))  Blood Culture (routine x 2)     Status: None (Preliminary result)   Collection Time: 11/21/17  1:30 PM  Result Value Ref Range Status   Specimen Description BLOOD RIGHT HAND  Final   Special Requests   Final    BOTTLES DRAWN AEROBIC AND ANAEROBIC Blood Culture results may not be optimal due to an excessive volume of blood received in culture bottles   Culture  Setup Time   Final    GRAM POSITIVE COCCI AEROBIC BOTTLE ONLY CRITICAL RESULT CALLED TO, READ BACK BY AND VERIFIED WITH: PHARMD L SEAY 11/22/17 AT 2226 BY CM Performed at [facility], [place] [address] [place], [place] [address]    Culture [facility] POSITIVE COCCI  Final   Report Status PENDING  Incomplete  Blood Culture ID Panel (Reflexed)     Status: Abnormal   Collection Time: 11/21/17  1:30 PM  Result Value Ref Range Status   Enterococcus species NOT DETECTED NOT DETECTED Final   Listeria monocytogenes NOT DETECTED NOT DETECTED Final   Staphylococcus species NOT DETECTED NOT DETECTED Final   Staphylococcus aureus NOT DETECTED NOT DETECTED Final   Streptococcus species DETECTED (A) NOT DETECTED Final    Comment: Not Enterococcus species, Streptococcus agalactiae, Streptococcus pyogenes, or Streptococcus pneumoniae. CRITICAL RESULT CALLED TO, READ BACK BY AND VERIFIED WITH: PHARMD L SEAY 11/22/17 AT 2227 BY CM    Streptococcus agalactiae NOT DETECTED NOT DETECTED Final   Streptococcus pneumoniae NOT DETECTED NOT DETECTED Final   Streptococcus pyogenes NOT DETECTED NOT DETECTED Final   Acinetobacter baumannii NOT DETECTED NOT DETECTED Final   Enterobacteriaceae species NOT DETECTED NOT DETECTED Final   Enterobacter cloacae complex NOT DETECTED NOT DETECTED Final   Escherichia coli NOT DETECTED NOT DETECTED Final   Klebsiella oxytoca NOT DETECTED NOT  DETECTED Final   Klebsiella pneumoniae NOT DETECTED NOT DETECTED Final   Proteus species NOT DETECTED NOT DETECTED Final   Serratia marcescens NOT DETECTED NOT DETECTED Final   Haemophilus influenzae NOT DETECTED NOT DETECTED Final   Neisseria meningitidis NOT DETECTED NOT DETECTED Final   Pseudomonas aeruginosa NOT DETECTED NOT DETECTED Final   Candida albicans NOT DETECTED NOT DETECTED Final   Candida glabrata NOT DETECTED NOT DETECTED Final   Candida krusei NOT DETECTED NOT DETECTED Final   Candida parapsilosis NOT DETECTED NOT DETECTED Final   Candida tropicalis NOT DETECTED NOT DETECTED Final    Comment: Performed at [facility], [place] [address], [place], [place] [address]  Blood Culture (routine x 2)     Status: None (Preliminary result)   Collection Time: 11/21/17  8:10 PM  Result Value Ref Range Status   Specimen Description BLOOD SITE NOT SPECIFIED  Final   Special Requests   Final    BOTTLES DRAWN AEROBIC ONLY Blood Culture results may not be optimal due to an inadequate volume of blood received in culture bottles   Culture   Final    NO GROWTH 2 DAYS Performed at [facility], [place] [address] [place], [place] [address]    Report Status PENDING  Incomplete  MRSA PCR Screening     Status: None   Collection Time: 11/21/17  8:19 PM  Result Value Ref Range Status   MRSA by PCR NEGATIVE NEGATIVE Final    Comment:        The GeneXpert MRSA Assay (FDA approved for NASAL specimens only), is one component of a comprehensive MRSA colonization surveillance program. It is not intended to diagnose MRSA infection nor to guide or monitor treatment for MRSA infections. Performed at [facility], [place] [address], [place], [place] [address]          Radiology Studies: Mr Foot Left W Wo Contrast  Result Date: 11/22/2017 CLINICAL DATA:  IV drug abuser who injects in his feet. Bilateral foot wounds for 1 week. Chills and malaise. EXAM: MRI OF THE LEFT FOREFOOT  WITHOUT AND WITH CONTRAST TECHNIQUE: Multiplanar, multisequence MR imaging of the left forefoot was performed both before and after administration of intravenous contrast. CONTRAST:  8.5 cc Gadavist IV COMPARISON:  Plain films left foot 11/21/2017. FINDINGS: Bones/Joint/Cartilage There is partial visualization of marrow edema in the medial malleolus with postcontrast enhancement. Bone marrow signal is otherwise normal. Ligaments Intact Muscles and Tendons No intramuscular fluid collection is identified. Soft tissues Diffuse subcutaneous edema enhancement are seen about the foot. There is  an abscess in the dorsal subcutaneous tissues centered over the bases of the first through third metatarsals. The abscess measures 1.8 cm craniocaudal by 4.6 cm transverse by 4.4 cm long. A second abscess in the subcutaneous tissues measuring 2.5 cm long by 2.3 cm craniocaudal by 0.9 cm transverse is identified along the proximal first metatarsal. This abscess is centered 1.5 cm distal to the first tarsometatarsal joint. A third abscess which is incompletely visualized is seen in subcutaneous tissues 3.2 cm superior to the tip of the medial malleolus. This collection measures approximately 1.5 cm craniocaudal by 0.2 cm transverse. IMPRESSION: Three subcutaneous abscesses are identified as described above. The largest collection is in the dorsal soft tissues at the level of the proximal first and third metatarsals. Edema and enhancement the medial malleolus consistent with osteomyelitis. Electronically Signed   By: Inge Rise M.D.   On: 11/22/2017 13:01   Dg Foot Complete Left  Result Date: 11/21/2017 CLINICAL DATA:  Bilateral foot pain for 4-5 days, left-greater-than-right, swelling and open sores, the patient is diabetic, and has a smoking history EXAM: LEFT FOOT - COMPLETE 3+ VIEW COMPARISON:  None. FINDINGS: There is soft tissue swelling over the dorsum of the left foot. However no underlying focal demineralization or  erosion is seen to indicate osteomyelitis. No acute abnormality is noted. Joint spaces appear normal. No erosion is seen. IMPRESSION: Soft tissue swelling over the dorsum of the foot. No underlying bony abnormality. Electronically Signed   By: Ivar Drape M.D.   On: 11/21/2017 15:30   Dg Foot Complete Right  Result Date: 11/21/2017 CLINICAL DATA:  Bilateral foot pain for 4-5 days, left-greater-than-right with swelling and redness, the patient is diabetic and does have a smoking history EXAM: RIGHT FOOT COMPLETE - 3+ VIEW COMPARISON:  None FINDINGS: There is mild soft tissue swelling over the dorsum of the right foot. No underlying bony demineralization or erosion is seen to indicate active osteomyelitis. Alignment is normal and joint spaces appear normal. IMPRESSION: Soft tissue swelling over the dorsum. No evidence of active osteomyelitis. Electronically Signed   By: Ivar Drape M.D.   On: 11/21/2017 15:32   [place] Ekg Site Rite  Result Date: 11/22/2017 If Site Rite image not attached, placement could not be confirmed due to current cardiac rhythm.       Scheduled Meds: . famotidine  20 mg Oral BID  . gabapentin  400 mg Oral TID  . insulin aspart  0-15 Units Subcutaneous TID WC  . insulin aspart  0-5 Units Subcutaneous QHS  . insulin aspart protamine- aspart  45 Units Subcutaneous BID WC  . levothyroxine  50 mcg Oral QAC breakfast  . sodium chloride flush  3 mL Intravenous Q12H  . traZODone  50 mg Oral QHS   Continuous Infusions: . 0.9 % NaCl with KCl 40 mEq / L 125 mL/hr (11/23/17 0018)  . piperacillin-tazobactam (ZOSYN)  IV    . vancomycin 750 mg (11/23/17 0024)     LOS: 2 days    Time spent: 35 mins.More than 50% of that time was spent in counseling and/or coordination of care.      Shelly Coss, MD [facility] Pager [phone]  If 7PM-7AM, please contact night-coverage [email or web address] Password [email or web address] 11/23/2017, 11:05 AM

## 2017-11-23 NOTE — Progress Notes (Signed)
Inpatient Diabetes Program Recommendations  AACE/ADA: New Consensus Statement on Inpatient Glycemic Control (2015)  Target Ranges:  Prepandial:   less than 140 mg/dL      Peak postprandial:   less than 180 mg/dL (1-2 hours)      Critically ill patients:  140 - 180 mg/dL   Review of Glycemic Control  Diabetes history: DM 2 Outpatient Diabetes medications: 70/30 40 units qam, 30 units qpm, Regular insulin SSI tid Current orders for Inpatient glycemic control: 70/30 45 units BID, Novolog 0-15 units tid, Novolog 0-5 units qhs  Inpatient Diabetes Program Recommendations:    Noted patient transitioned from IV insulin to 70/30 30 units BID, close to home dose. Glucose 400's this am, 70/30 insulin increased.   Noted patient received 70/30 30 units this am with no additional units given with dose increase. 70/30 45 units to be given tonight. Will watch trends. Expect patient to have elevated glucose until 70/30 dose this evening.  Based on glucose trends on close to home dose of 70/30, I can see where the patient's A1c is elevated.  Agree with increase in insulin. Will watch trends.  Thanks,  Tama Headings RN, MSN, BC-ADM Inpatient Diabetes Coordinator Team Pager [phone] (8a-5p)

## 2017-11-23 NOTE — Progress Notes (Signed)
PHARMACY - PHYSICIAN COMMUNICATION CRITICAL VALUE ALERT - BLOOD CULTURE IDENTIFICATION (BCID)  Luke [name] is an 37 y.o. male who presented to [place] on 11/21/2017 with a chief complaint of Osteomyelitis  Assessment:  37 yo male with  chief complaint of sepsis from LE wounds. Patient growing viridans strep in 1 of 3 blood cultures. Called by [facility] with positive Gram Positive Rods in the anaerobic bottle of the same set that is growing strep. Most likely a contaminant, therefore lab will not being performing BCID.    Name of physician (or Provider) Contacted: Adhikari  Current antibiotics: Vancomycin and Zosyn  Changes to prescribed antibiotics recommended:  No change recommended  Results for orders placed or performed during the hospital encounter of 11/21/17  Blood Culture ID Panel (Reflexed) (Collected: 11/21/2017  1:30 PM)  Result Value Ref Range   Enterococcus species NOT DETECTED NOT DETECTED   Listeria monocytogenes NOT DETECTED NOT DETECTED   Staphylococcus species NOT DETECTED NOT DETECTED   Staphylococcus aureus NOT DETECTED NOT DETECTED   Streptococcus species DETECTED (A) NOT DETECTED   Streptococcus agalactiae NOT DETECTED NOT DETECTED   Streptococcus pneumoniae NOT DETECTED NOT DETECTED   Streptococcus pyogenes NOT DETECTED NOT DETECTED   Acinetobacter baumannii NOT DETECTED NOT DETECTED   Enterobacteriaceae species NOT DETECTED NOT DETECTED   Enterobacter cloacae complex NOT DETECTED NOT DETECTED   Escherichia coli NOT DETECTED NOT DETECTED   Klebsiella oxytoca NOT DETECTED NOT DETECTED   Klebsiella pneumoniae NOT DETECTED NOT DETECTED   Proteus species NOT DETECTED NOT DETECTED   Serratia marcescens NOT DETECTED NOT DETECTED   Haemophilus influenzae NOT DETECTED NOT DETECTED   Neisseria meningitidis NOT DETECTED NOT DETECTED   Pseudomonas aeruginosa NOT DETECTED NOT DETECTED   Candida albicans NOT DETECTED NOT DETECTED   Candida glabrata NOT DETECTED NOT  DETECTED   Candida krusei NOT DETECTED NOT DETECTED   Candida parapsilosis NOT DETECTED NOT DETECTED   Candida tropicalis NOT DETECTED NOT DETECTED  Rulon Abdalla A. Levada Dy, PharmD, [place] Pager: [phone] Please utilize Amion for appropriate phone number to reach the unit pharmacist ([place])    [name] 11/23/2017  5:11 PM

## 2017-11-23 NOTE — Progress Notes (Signed)
Patient being delayed until tomorrow. Patient updated on plan of care. Luke Jew, RN notified that patient would be coming back to [facility]. Patient given ice chips with permission of Dr. [name].

## 2017-11-23 NOTE — Consult Note (Addendum)
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  HIV (-) Diabetic control Query pt if he is interested in suboxone.  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 famotidine  20 mg Oral BID  . gabapentin  400 mg Oral TID  . insulin aspart  0-15 Units Subcutaneous TID WC  . insulin aspart  0-5 Units Subcutaneous QHS  . insulin aspart protamine- aspart  45 Units Subcutaneous BID WC  . levothyroxine  50 mcg Oral QAC breakfast  . sodium chloride flush  3 mL Intravenous Q12H  . traZODone  50 mg Oral QHS    HPI: Luke [name] is a 37 y.o. male with hx of DM2, on insulin, prev IVDA and LUE abscess (2014), who relapsed 3 weeks ago.  He has been using his feet as injection sites and developed worsening wounds over 1 week pta.  He was adm on 9-5 and was started on vanco/zosyn. He was also placed on DKA protocol for Glc 682 and AG of 17.  He was also noted to have Cr 1.95, which was increased from his baseline.  He was seen by ortho who rec debridement. He had MRI on 9-6 that showed 3 abscess in his L foot and osteo of his medial malleolus.  Suegery was delayed til 9-8 due  to continued elevations in Glc.  His left foot wound burst open this AM while going to bathroom.   Review of Systems: Review of Systems  Constitutional: Positive for fever.  Eyes: Negative for blurred vision.  Respiratory: Negative for cough and shortness of breath.   Gastrointestinal: Negative for abdominal pain and diarrhea.  Genitourinary: Negative for dysuria.  Neurological: Negative for tingling.  Please see HPI. All other systems reviewed and negative.   Past Medical History:  Diagnosis Date  . Anemia   . Anxiety  Dx 2008  . Diabetes mellitus without complication ([place]) Dx 7035  . DKA (diabetic ketoacidoses) ([place]) 07/21/2017  . GERD (gastroesophageal reflux disease) Dx 2008  . Headache(784.0)   . Pneumonia   . Seizures ([place]) 2011    x 2 in lifetime. on Dilantin for a while.   . Substance abuse ([place]) 2013    heroin use, multiple relapses    Social History   Tobacco Use  . Smoking status: Current Every Day Smoker    Packs/day: 1.00    Types: Cigarettes  . Smokeless tobacco: Never Used  Substance Use Topics  . Alcohol use: No    Alcohol/week: 0.0 standard drinks    Comment: occasional  .  Drug use: Not Currently    Types: Cocaine, IV, Heroin    Comment: IVDU since Dad died in 06/23/06 with 15 month period of sobriety 2018-2019, relapse Aug 2019.    Family History  Problem Relation Age of Onset  . Diabetes Father   . Heart disease Father   . Mental illness Sister   . Cancer Neg Hx      Medications:  Scheduled: . famotidine  20 mg Oral BID  . gabapentin  400 mg Oral TID  . insulin aspart  0-15 Units Subcutaneous TID WC  . insulin aspart  0-5 Units Subcutaneous QHS  . insulin aspart protamine- aspart  45 Units Subcutaneous BID WC  . levothyroxine  50 mcg Oral QAC breakfast  . sodium chloride flush  3 mL Intravenous Q12H  . traZODone  50 mg Oral QHS    Abtx:  Anti-infectives (From admission, onward)   Start     Dose/Rate Route Frequency Ordered Stop    11/23/17 1400  piperacillin-tazobactam (ZOSYN) IVPB 3.375 g     3.375 g 12.5 mL/hr over 240 Minutes Intravenous Every 8 hours 11/23/17 1031     11/23/17 0000  metroNIDAZOLE (FLAGYL) IVPB 500 mg  Status:  Discontinued     500 mg 100 mL/hr over 60 Minutes Intravenous Every 8 hours 11/22/17 2312 11/23/17 1014   11/22/17 2315  cefTRIAXone (ROCEPHIN) 2 g in sodium chloride 0.9 % 100 mL IVPB  Status:  Discontinued     2 g 200 mL/hr over 30 Minutes Intravenous Every 24 hours 11/22/17 2312 11/23/17 1014   11/22/17 1400  piperacillin-tazobactam (ZOSYN) IVPB 3.375 g  Status:  Discontinued     3.375 g 12.5 mL/hr over 240 Minutes Intravenous Every 8 hours 11/22/17 1043 11/22/17 2311   11/22/17 1045  vancomycin (VANCOCIN) IVPB 750 mg/150 ml premix     750 mg 150 mL/hr over 60 Minutes Intravenous Every 12 hours 11/22/17 1043     11/21/17 1830  vancomycin (VANCOCIN) IVPB 750 mg/150 ml premix     750 mg 150 mL/hr over 60 Minutes Intravenous  Once 11/21/17 1816 11/21/17 2030   11/21/17 1715  vancomycin (VANCOCIN) IVPB 1000 mg/200 mL premix     1,000 mg 200 mL/hr over 60 Minutes Intravenous  Once 11/21/17 1710 11/21/17 1914   11/21/17 1715  piperacillin-tazobactam (ZOSYN) IVPB 3.375 g     3.375 g 12.5 mL/hr over 240 Minutes Intravenous  Once 11/21/17 1710 11/21/17 2030        OBJECTIVE: Blood pressure (!) 150/78, pulse 89, temperature 98.5 F (36.9 C), temperature source Oral, resp. rate 16, height '5\' 9"'$  (1.753 m), weight 86.8 kg, SpO2 98 %.  Physical Exam  Constitutional: He is oriented to person, place, and time. He appears well-developed and well-nourished.  HENT:  Head: Normocephalic and atraumatic.  Mouth/Throat: No oropharyngeal exudate.  Eyes: Pupils are equal, round, and reactive to light. EOM are normal.  Neck: Normal range of motion. Neck supple.  Cardiovascular: Normal rate, regular rhythm and normal heart sounds.  Pulmonary/Chest: Effort normal and breath sounds normal.    Abdominal: Bowel sounds are normal. He exhibits no distension. There is no tenderness.  Lymphadenopathy:    He has no cervical adenopathy.  Neurological: He is alert and oriented to person, place, and time. No sensory deficit.  Psychiatric: He has a normal mood and affect.  Media Information   Document Information   Photos    11/23/2017 13:31  Attached To:  Hospital Encounter on 11/21/17  Source Information   Campbell Riches, MD  [id number]     Lab Results Results for orders placed or performed during the hospital encounter of 11/21/17 (from the past 48 hour(s))  Urinalysis, Routine w reflex microscopic     Status: Abnormal   Collection Time: 11/21/17  1:20 PM  Result Value Ref Range   Color, Urine STRAW (A) YELLOW   APPearance CLEAR CLEAR   Specific Gravity, Urine 1.027 1.005 - 1.030   pH 6.0 5.0 - 8.0   Glucose, UA >=500 (A) NEGATIVE mg/dL   Hgb urine dipstick MODERATE (A) NEGATIVE   Bilirubin Urine NEGATIVE NEGATIVE   Ketones, ur 20 (A) NEGATIVE mg/dL   Protein, ur 30 (A) NEGATIVE mg/dL   Nitrite NEGATIVE NEGATIVE   Leukocytes, UA NEGATIVE NEGATIVE   RBC / HPF 0-5 0 - 5 RBC/hpf   WBC, UA 0-5 0 - 5 WBC/hpf   Bacteria, UA RARE (A) NONE SEEN    Comment: Performed at [facility], [address] [place], [place] [address]  Urine rapid drug screen (hosp performed)     Status: Abnormal   Collection Time: 11/21/17  1:20 PM  Result Value Ref Range   Opiates POSITIVE (A) NONE DETECTED   Cocaine POSITIVE (A) NONE DETECTED   Benzodiazepines NONE DETECTED NONE DETECTED   Amphetamines NONE DETECTED NONE DETECTED   Tetrahydrocannabinol NONE DETECTED NONE DETECTED   Barbiturates NONE DETECTED NONE DETECTED    Comment: (NOTE) DRUG SCREEN FOR MEDICAL PURPOSES ONLY.  IF CONFIRMATION IS NEEDED FOR ANY PURPOSE, NOTIFY LAB WITHIN 5 DAYS. LOWEST DETECTABLE LIMITS FOR URINE DRUG SCREEN Drug Class                     Cutoff (ng/mL) Amphetamine and  metabolites    1000 Barbiturate and metabolites    200 Benzodiazepine                 193 Tricyclics and metabolites     300 Opiates and metabolites        300 Cocaine and metabolites        300 THC                            50 Performed at [facility], [place] [address] [place],  [address]   Blood Culture (routine x 2)     Status: None (Preliminary result)   Collection Time: 11/21/17  1:30 PM  Result Value Ref Range   Specimen Description BLOOD RIGHT HAND    Special Requests      BOTTLES DRAWN AEROBIC AND ANAEROBIC Blood Culture results may not be optimal due to an excessive volume of blood received in culture bottles   Culture  Setup Time      GRAM POSITIVE COCCI AEROBIC BOTTLE ONLY CRITICAL RESULT CALLED TO, READ BACK BY AND VERIFIED WITH: PHARMD L SEAY 11/22/17 AT 2226 BY CM Performed at [facility], [place] [address] [place],  [address]    Culture GRAM POSITIVE COCCI    Report Status PENDING   Blood Culture ID Panel (Reflexed)     Status: Abnormal   Collection Time: 11/21/17  1:30 PM  Result Value Ref Range   Enterococcus species NOT DETECTED NOT DETECTED   Listeria monocytogenes NOT DETECTED NOT DETECTED   Staphylococcus species NOT DETECTED NOT DETECTED   Staphylococcus aureus NOT DETECTED NOT DETECTED   Streptococcus species  DETECTED (A) NOT DETECTED    Comment: Not Enterococcus species, Streptococcus agalactiae, Streptococcus pyogenes, or Streptococcus pneumoniae. CRITICAL RESULT CALLED TO, READ BACK BY AND VERIFIED WITH: PHARMD L SEAY 11/22/17 AT 2227 BY CM    Streptococcus agalactiae NOT DETECTED NOT DETECTED   Streptococcus pneumoniae NOT DETECTED NOT DETECTED   Streptococcus pyogenes NOT DETECTED NOT DETECTED   Acinetobacter baumannii NOT DETECTED NOT DETECTED   Enterobacteriaceae species NOT DETECTED NOT DETECTED   Enterobacter cloacae complex NOT DETECTED NOT DETECTED   Escherichia coli NOT DETECTED NOT DETECTED   Klebsiella oxytoca NOT  DETECTED NOT DETECTED   Klebsiella pneumoniae NOT DETECTED NOT DETECTED   Proteus species NOT DETECTED NOT DETECTED   Serratia marcescens NOT DETECTED NOT DETECTED   Haemophilus influenzae NOT DETECTED NOT DETECTED   Neisseria meningitidis NOT DETECTED NOT DETECTED   Pseudomonas aeruginosa NOT DETECTED NOT DETECTED   Candida albicans NOT DETECTED NOT DETECTED   Candida glabrata NOT DETECTED NOT DETECTED   Candida krusei NOT DETECTED NOT DETECTED   Candida parapsilosis NOT DETECTED NOT DETECTED   Candida tropicalis NOT DETECTED NOT DETECTED    Comment: Performed at [facility], [place] [address] [place], [place] [address]  Comprehensive metabolic panel     Status: Abnormal   Collection Time: 11/21/17  1:32 PM  Result Value Ref Range   Sodium 131 (L) 135 - 145 mmol/L   Potassium 4.5 3.5 - 5.1 mmol/L   Chloride 95 (L) 98 - 111 mmol/L   CO2 19 (L) 22 - 32 mmol/L   Glucose, Bld 682 (HH) 70 - 99 mg/dL    Comment: CRITICAL RESULT CALLED TO, READ BACK BY AND VERIFIED WITH: FUNK,B RN @ 1505 11/21/17 LEONARD,A    BUN 19 6 - 20 mg/dL   Creatinine, Ser 1.95 (H) 0.61 - 1.24 mg/dL   Calcium 8.8 (L) 8.9 - 10.3 mg/dL   Total Protein 6.9 6.5 - 8.1 g/dL   Albumin 2.9 (L) 3.5 - 5.0 g/dL   AST 12 (L) 15 - 41 U/L   ALT 14 0 - 44 U/L   Alkaline Phosphatase 128 (H) 38 - 126 U/L   Total Bilirubin 1.2 0.3 - 1.2 mg/dL   GFR calc non Af Amer 42 (L) >60 mL/min   GFR calc Af Amer 49 (L) >60 mL/min    Comment: (NOTE) The eGFR has been calculated using the CKD EPI equation. This calculation has not been validated in all clinical situations. eGFR's persistently <60 mL/min signify possible Chronic Kidney Disease.    Anion gap 17 (H) 5 - 15    Comment: Performed at [facility], [place] [address] [place], [place] [address]  CBC WITH DIFFERENTIAL     Status: Abnormal   Collection Time: 11/21/17  1:32 PM  Result Value Ref Range   WBC 24.1 (H) 4.0 - 10.5 K/uL   RBC 4.31 4.22 - 5.81 MIL/uL    Hemoglobin 11.9 (L) 13.0 - 17.0 g/dL   HCT 36.1 (L) 39.0 - 52.0 %   MCV 83.8 78.0 - 100.0 fL   MCH 27.6 26.0 - 34.0 pg   MCHC 33.0 30.0 - 36.0 g/dL   RDW 13.2 11.5 - 15.5 %   Platelets 560 (H) 150 - 400 K/uL   Neutrophils Relative % 75 %   Neutro Abs 18.0 (H) 1.7 - 7.7 K/uL   Lymphocytes Relative 13 %   Lymphs Abs 3.2 0.7 - 4.0 K/uL   Monocytes Relative 9 %  Monocytes Absolute 2.3 (H) 0.1 - 1.0 K/uL   Eosinophils Relative 1 %   Eosinophils Absolute 0.3 0.0 - 0.7 K/uL   Basophils Relative 1 %   Basophils Absolute 0.1 0.0 - 0.1 K/uL   Immature Granulocytes 1 %   Abs Immature Granulocytes 0.2 (H) 0.0 - 0.1 K/uL    Comment: Performed at [facility] [address], [place], [place] [address]  I-Stat CG4 Lactic Acid, ED  (not at  [facility])     Status: None   Collection Time: 11/21/17  1:49 PM  Result Value Ref Range   Lactic Acid, Venous 1.22 0.5 - 1.9 mmol/L  CG4 I-STAT (Lactic acid)     Status: None   Collection Time: 11/21/17  7:57 PM  Result Value Ref Range   Lactic Acid, Venous 1.77 0.5 - 1.9 mmol/L  Glucose, capillary     Status: Abnormal   Collection Time: 11/21/17  8:07 PM  Result Value Ref Range   Glucose-Capillary 519 (HH) 70 - 99 mg/dL  Blood Culture (routine x 2)     Status: None (Preliminary result)   Collection Time: 11/21/17  8:10 PM  Result Value Ref Range   Specimen Description BLOOD SITE NOT SPECIFIED    Special Requests      BOTTLES DRAWN AEROBIC ONLY Blood Culture results may not be optimal due to an inadequate volume of blood received in culture bottles   Culture      NO GROWTH 2 DAYS Performed at [facility], [place] [address], [place],  [address]    Report Status PENDING   MRSA PCR Screening     Status: None   Collection Time: 11/21/17  8:19 PM  Result Value Ref Range   MRSA by PCR NEGATIVE NEGATIVE    Comment:        The GeneXpert MRSA Assay (FDA approved for NASAL specimens only), is one component of a comprehensive MRSA  colonization surveillance program. It is not intended to diagnose MRSA infection nor to guide or monitor treatment for MRSA infections. Performed at [facility], [place] [address], [place], [place] [address]   Glucose, capillary     Status: Abnormal   Collection Time: 11/21/17  8:56 PM  Result Value Ref Range   Glucose-Capillary >600 (HH) 70 - 99 mg/dL  Glucose, capillary     Status: Abnormal   Collection Time: 11/21/17 10:07 PM  Result Value Ref Range   Glucose-Capillary 487 (H) 70 - 99 mg/dL  Glucose, capillary     Status: Abnormal   Collection Time: 11/21/17 11:04 PM  Result Value Ref Range   Glucose-Capillary 378 (H) 70 - 99 mg/dL  Glucose, capillary     Status: Abnormal   Collection Time: 11/22/17 12:12 AM  Result Value Ref Range   Glucose-Capillary 341 (H) 70 - 99 mg/dL  Glucose, capillary     Status: Abnormal   Collection Time: 11/22/17  1:16 AM  Result Value Ref Range   Glucose-Capillary 251 (H) 70 - 99 mg/dL  Glucose, capillary     Status: Abnormal   Collection Time: 11/22/17  2:14 AM  Result Value Ref Range   Glucose-Capillary 213 (H) 70 - 99 mg/dL  Glucose, capillary     Status: Abnormal   Collection Time: 11/22/17  3:16 AM  Result Value Ref Range   Glucose-Capillary 170 (H) 70 - 99 mg/dL  Glucose, capillary     Status: Abnormal   Collection Time: 11/22/17  4:15 AM  Result Value Ref Range   Glucose-Capillary 142 (H) 70 - 99 mg/dL  Glucose, capillary     Status: Abnormal   Collection Time: 11/22/17  5:13 AM  Result Value Ref Range   Glucose-Capillary 137 (H) 70 - 99 mg/dL  Glucose, capillary     Status: Abnormal   Collection Time: 11/22/17  6:16 AM  Result Value Ref Range   Glucose-Capillary 131 (H) 70 - 99 mg/dL  Glucose, capillary     Status: Abnormal   Collection Time: 11/22/17  7:19 AM  Result Value Ref Range   Glucose-Capillary 171 (H) 70 - 99 mg/dL  Glucose, capillary     Status: Abnormal   Collection Time: 11/22/17  8:24 AM  Result Value Ref  Range   Glucose-Capillary 219 (H) 70 - 99 mg/dL  Glucose, capillary     Status: Abnormal   Collection Time: 11/22/17  9:22 AM  Result Value Ref Range   Glucose-Capillary 221 (H) 70 - 99 mg/dL  Glucose, capillary     Status: Abnormal   Collection Time: 11/22/17 10:39 AM  Result Value Ref Range   Glucose-Capillary 261 (H) 70 - 99 mg/dL  Glucose, capillary     Status: Abnormal   Collection Time: 11/22/17  1:28 PM  Result Value Ref Range   Glucose-Capillary 139 (H) 70 - 99 mg/dL  Glucose, capillary     Status: Abnormal   Collection Time: 11/22/17  2:54 PM  Result Value Ref Range   Glucose-Capillary 160 (H) 70 - 99 mg/dL  Comprehensive metabolic panel     Status: Abnormal   Collection Time: 11/22/17  5:50 PM  Result Value Ref Range   Sodium 138 135 - 145 mmol/L   Potassium 3.6 3.5 - 5.1 mmol/L   Chloride 107 98 - 111 mmol/L   CO2 23 22 - 32 mmol/L   Glucose, Bld 169 (H) 70 - 99 mg/dL   BUN 11 6 - 20 mg/dL   Creatinine, Ser 1.16 0.61 - 1.24 mg/dL   Calcium 8.2 (L) 8.9 - 10.3 mg/dL   Total Protein 5.5 (L) 6.5 - 8.1 g/dL   Albumin 2.2 (L) 3.5 - 5.0 g/dL   AST 15 15 - 41 U/L   ALT 11 0 - 44 U/L   Alkaline Phosphatase 94 38 - 126 U/L   Total Bilirubin 0.7 0.3 - 1.2 mg/dL   GFR calc non Af Amer >60 >60 mL/min   GFR calc Af Amer >60 >60 mL/min    Comment: (NOTE) The eGFR has been calculated using the CKD EPI equation. This calculation has not been validated in all clinical situations. eGFR's persistently <60 mL/min signify possible Chronic Kidney Disease.    Anion gap 8 5 - 15    Comment: Performed at [facility] [address], [place], [place] [address]  C-reactive protein     Status: Abnormal   Collection Time: 11/22/17  5:50 PM  Result Value Ref Range   CRP 12.2 (H) <1.0 mg/dL    Comment: Performed at [facility] [address] [place], [place] [address]  CBC     Status: Abnormal   Collection Time: 11/22/17  5:50 PM  Result Value Ref Range   WBC 19.5 (H)  4.0 - 10.5 K/uL   RBC 3.46 (L) 4.22 - 5.81 MIL/uL   Hemoglobin 9.3 (L) 13.0 - 17.0 g/dL    Comment: REPEATED TO VERIFY SPECIMEN CHECKED FOR CLOTS DELTA CHECK NOTED    HCT 28.9 (L) 39.0 -  52.0 %   MCV 83.5 78.0 - 100.0 fL   MCH 26.9 26.0 - 34.0 pg   MCHC 32.2 30.0 - 36.0 g/dL   RDW 13.1 11.5 - 15.5 %   Platelets 453 (H) 150 - 400 K/uL    Comment: Performed at [facility] [address], [place], [place] [address]  Hemoglobin A1c     Status: Abnormal   Collection Time: 11/22/17  5:50 PM  Result Value Ref Range   Hgb A1c MFr Bld 12.1 (H) 4.8 - 5.6 %    Comment: (NOTE) Pre diabetes:          5.7%-6.4% Diabetes:              >6.4% Glycemic control for   <7.0% adults with diabetes    Mean Plasma Glucose 300.57 mg/dL    Comment: Performed at [facility] [address] [place], [place] [address]  Glucose, capillary     Status: Abnormal   Collection Time: 11/22/17  5:56 PM  Result Value Ref Range   Glucose-Capillary 173 (H) 70 - 99 mg/dL  Glucose, capillary     Status: Abnormal   Collection Time: 11/22/17 10:00 PM  Result Value Ref Range   Glucose-Capillary 268 (H) 70 - 99 mg/dL  Comprehensive metabolic panel     Status: Abnormal   Collection Time: 11/23/17  4:10 AM  Result Value Ref Range   Sodium 135 135 - 145 mmol/L   Potassium 4.5 3.5 - 5.1 mmol/L    Comment: DELTA CHECK NOTED   Chloride 107 98 - 111 mmol/L   CO2 20 (L) 22 - 32 mmol/L   Glucose, Bld 400 (H) 70 - 99 mg/dL   BUN 10 6 - 20 mg/dL   Creatinine, Ser 1.31 (H) 0.61 - 1.24 mg/dL   Calcium 8.2 (L) 8.9 - 10.3 mg/dL   Total Protein 5.5 (L) 6.5 - 8.1 g/dL   Albumin 2.1 (L) 3.5 - 5.0 g/dL   AST 16 15 - 41 U/L   ALT 11 0 - 44 U/L   Alkaline Phosphatase 88 38 - 126 U/L   Total Bilirubin 0.4 0.3 - 1.2 mg/dL   GFR calc non Af Amer >60 >60 mL/min   GFR calc Af Amer >60 >60 mL/min    Comment: (NOTE) The eGFR has been calculated using the CKD EPI equation. This calculation has not been validated in all  clinical situations. eGFR's persistently <60 mL/min signify possible Chronic Kidney Disease.    Anion gap 8 5 - 15    Comment: Performed at [facility] [address] [place], [place] [address]  C-reactive protein     Status: Abnormal   Collection Time: 11/23/17  4:10 AM  Result Value Ref Range   CRP 9.0 (H) <1.0 mg/dL    Comment: Performed at [facility] [address] [place], [place] [address]  CBC     Status: Abnormal   Collection Time: 11/23/17  4:10 AM  Result Value Ref Range   WBC 16.9 (H) 4.0 - 10.5 K/uL   RBC 3.46 (L) 4.22 - 5.81 MIL/uL   Hemoglobin 9.4 (L) 13.0 - 17.0 g/dL   HCT 28.4 (L) 39.0 - 52.0 %   MCV 82.1 78.0 - 100.0 fL   MCH 27.2 26.0 - 34.0 pg   MCHC 33.1 30.0 - 36.0 g/dL   RDW 12.9 11.5 - 15.5 %   Platelets 426 (H) 150 - 400 K/uL  Comment: Performed at [facility], [place] [address], [place], [place] [address]  Glucose, capillary     Status: Abnormal   Collection Time: 11/23/17  6:10 AM  Result Value Ref Range   Glucose-Capillary 409 (H) 70 - 99 mg/dL  Glucose, capillary     Status: Abnormal   Collection Time: 11/23/17  8:16 AM  Result Value Ref Range   Glucose-Capillary 434 (H) 70 - 99 mg/dL  Glucose, capillary     Status: Abnormal   Collection Time: 11/23/17 12:38 PM  Result Value Ref Range   Glucose-Capillary 399 (H) 70 - 99 mg/dL      Component Value Date/Time   SDES BLOOD SITE NOT SPECIFIED 11/21/2017 2010   SPECREQUEST  11/21/2017 2010    BOTTLES DRAWN AEROBIC ONLY Blood Culture results may not be optimal due to an inadequate volume of blood received in culture bottles   CULT  11/21/2017 2010    NO GROWTH 2 DAYS Performed at [facility], [place] [address], [place], [place] [address]    REPTSTATUS PENDING 11/21/2017 2010   Mr Foot Left W Wo Contrast  Result Date: 11/22/2017 CLINICAL DATA:  IV drug abuser who injects in his feet. Bilateral foot wounds for 1 week. Chills and malaise. EXAM: MRI OF THE LEFT FOREFOOT  WITHOUT AND WITH CONTRAST TECHNIQUE: Multiplanar, multisequence MR imaging of the left forefoot was performed both before and after administration of intravenous contrast. CONTRAST:  8.5 cc Gadavist IV COMPARISON:  Plain films left foot 11/21/2017. FINDINGS: Bones/Joint/Cartilage There is partial visualization of marrow edema in the medial malleolus with postcontrast enhancement. Bone marrow signal is otherwise normal. Ligaments Intact Muscles and Tendons No intramuscular fluid collection is identified. Soft tissues Diffuse subcutaneous edema enhancement are seen about the foot. There is an abscess in the dorsal subcutaneous tissues centered over the bases of the first through third metatarsals. The abscess measures 1.8 cm craniocaudal by 4.6 cm transverse by 4.4 cm long. A second abscess in the subcutaneous tissues measuring 2.5 cm long by 2.3 cm craniocaudal by 0.9 cm transverse is identified along the proximal first metatarsal. This abscess is centered 1.5 cm distal to the first tarsometatarsal joint. A third abscess which is incompletely visualized is seen in subcutaneous tissues 3.2 cm superior to the tip of the medial malleolus. This collection measures approximately 1.5 cm craniocaudal by 0.2 cm transverse. IMPRESSION: Three subcutaneous abscesses are identified as described above. The largest collection is in the dorsal soft tissues at the level of the proximal first and third metatarsals. Edema and enhancement the medial malleolus consistent with osteomyelitis. Electronically Signed   By: Inge Rise M.D.   On: 11/22/2017 13:01   Dg Foot Complete Left  Result Date: 11/21/2017 CLINICAL DATA:  Bilateral foot pain for 4-5 days, left-greater-than-right, swelling and open sores, the patient is diabetic, and has a smoking history EXAM: LEFT FOOT - COMPLETE 3+ VIEW COMPARISON:  None. FINDINGS: There is soft tissue swelling over the dorsum of the left foot. However no underlying focal demineralization or  erosion is seen to indicate osteomyelitis. No acute abnormality is noted. Joint spaces appear normal. No erosion is seen. IMPRESSION: Soft tissue swelling over the dorsum of the foot. No underlying bony abnormality. Electronically Signed   By: Ivar Drape M.D.   On: 11/21/2017 15:30   Dg Foot Complete Right  Result Date: 11/21/2017 CLINICAL DATA:  Bilateral foot pain for 4-5 days, left-greater-than-right with swelling and redness, the patient is diabetic and  does have a smoking history EXAM: RIGHT FOOT COMPLETE - 3+ VIEW COMPARISON:  None FINDINGS: There is mild soft tissue swelling over the dorsum of the right foot. No underlying bony demineralization or erosion is seen to indicate active osteomyelitis. Alignment is normal and joint spaces appear normal. IMPRESSION: Soft tissue swelling over the dorsum. No evidence of active osteomyelitis. Electronically Signed   By: Ivar Drape M.D.   On: 11/21/2017 15:32   [place] Ekg Site Rite  Result Date: 11/22/2017 If [facility] image not attached, placement could not be confirmed due to current cardiac rhythm.  Recent Results (from the past 240 hour(s))  Blood Culture (routine x 2)     Status: None (Preliminary result)   Collection Time: 11/21/17  1:30 PM  Result Value Ref Range Status   Specimen Description BLOOD RIGHT HAND  Final   Special Requests   Final    BOTTLES DRAWN AEROBIC AND ANAEROBIC Blood Culture results may not be optimal due to an excessive volume of blood received in culture bottles   Culture  Setup Time   Final    GRAM POSITIVE COCCI AEROBIC BOTTLE ONLY CRITICAL RESULT CALLED TO, READ BACK BY AND VERIFIED WITH: PHARMD L SEAY 11/22/17 AT 2226 BY CM Performed at [facility], [place] [address] [place], [place] [address]    Culture GRAM POSITIVE COCCI  Final   Report Status PENDING  Incomplete  Blood Culture ID Panel (Reflexed)     Status: Abnormal   Collection Time: 11/21/17  1:30 PM  Result Value Ref Range Status   Enterococcus species  NOT DETECTED NOT DETECTED Final   Listeria monocytogenes NOT DETECTED NOT DETECTED Final   Staphylococcus species NOT DETECTED NOT DETECTED Final   Staphylococcus aureus NOT DETECTED NOT DETECTED Final   Streptococcus species DETECTED (A) NOT DETECTED Final    Comment: Not Enterococcus species, Streptococcus agalactiae, Streptococcus pyogenes, or Streptococcus pneumoniae. CRITICAL RESULT CALLED TO, READ BACK BY AND VERIFIED WITH: PHARMD L SEAY 11/22/17 AT 2227 BY CM    Streptococcus agalactiae NOT DETECTED NOT DETECTED Final   Streptococcus pneumoniae NOT DETECTED NOT DETECTED Final   Streptococcus pyogenes NOT DETECTED NOT DETECTED Final   Acinetobacter baumannii NOT DETECTED NOT DETECTED Final   Enterobacteriaceae species NOT DETECTED NOT DETECTED Final   Enterobacter cloacae complex NOT DETECTED NOT DETECTED Final   Escherichia coli NOT DETECTED NOT DETECTED Final   Klebsiella oxytoca NOT DETECTED NOT DETECTED Final   Klebsiella pneumoniae NOT DETECTED NOT DETECTED Final   Proteus species NOT DETECTED NOT DETECTED Final   Serratia marcescens NOT DETECTED NOT DETECTED Final   Haemophilus influenzae NOT DETECTED NOT DETECTED Final   Neisseria meningitidis NOT DETECTED NOT DETECTED Final   Pseudomonas aeruginosa NOT DETECTED NOT DETECTED Final   Candida albicans NOT DETECTED NOT DETECTED Final   Candida glabrata NOT DETECTED NOT DETECTED Final   Candida krusei NOT DETECTED NOT DETECTED Final   Candida parapsilosis NOT DETECTED NOT DETECTED Final   Candida tropicalis NOT DETECTED NOT DETECTED Final    Comment: Performed at [facility], [place] [address], [place], [place] [address]  Blood Culture (routine x 2)     Status: None (Preliminary result)   Collection Time: 11/21/17  8:10 PM  Result Value Ref Range Status   Specimen Description BLOOD SITE NOT SPECIFIED  Final   Special Requests   Final    BOTTLES DRAWN AEROBIC ONLY Blood Culture results may not be optimal due to an  inadequate volume of blood received in culture bottles   Culture   Final    NO GROWTH 2 DAYS Performed at [facility], [place] [address] [place], [place] [address]    Report Status PENDING  Incomplete  MRSA PCR Screening     Status: None   Collection Time: 11/21/17  8:19 PM  Result Value Ref Range Status   MRSA by PCR NEGATIVE NEGATIVE Final    Comment:        The GeneXpert MRSA Assay (FDA approved for NASAL specimens only), is one component of a comprehensive MRSA colonization surveillance program. It is not intended to diagnose MRSA infection nor to guide or monitor treatment for MRSA infections. Performed at [facility], [place] [address] [place], [place] [address]     Microbiology: Recent Results (from the past 240 hour(s))  Blood Culture (routine x 2)     Status: None (Preliminary result)   Collection Time: 11/21/17  1:30 PM  Result Value Ref Range Status   Specimen Description BLOOD RIGHT HAND  Final   Special Requests   Final    BOTTLES DRAWN AEROBIC AND ANAEROBIC Blood Culture results may not be optimal due to an excessive volume of blood received in culture bottles   Culture  Setup Time   Final    GRAM POSITIVE COCCI AEROBIC BOTTLE ONLY CRITICAL RESULT CALLED TO, READ BACK BY AND VERIFIED WITH: PHARMD L SEAY 11/22/17 AT 2226 BY CM Performed at [facility], [place] [address], [place], [place] [address]    Culture GRAM POSITIVE COCCI  Final   Report Status PENDING  Incomplete  Blood Culture ID Panel (Reflexed)     Status: Abnormal   Collection Time: 11/21/17  1:30 PM  Result Value Ref Range Status   Enterococcus species NOT DETECTED NOT DETECTED Final   Listeria monocytogenes NOT DETECTED NOT DETECTED Final   Staphylococcus species NOT DETECTED NOT DETECTED Final   Staphylococcus aureus NOT DETECTED NOT DETECTED Final   Streptococcus species DETECTED (A) NOT DETECTED Final    Comment: Not Enterococcus species, Streptococcus agalactiae, Streptococcus  pyogenes, or Streptococcus pneumoniae. CRITICAL RESULT CALLED TO, READ BACK BY AND VERIFIED WITH: PHARMD L SEAY 11/22/17 AT 2227 BY CM    Streptococcus agalactiae NOT DETECTED NOT DETECTED Final   Streptococcus pneumoniae NOT DETECTED NOT DETECTED Final   Streptococcus pyogenes NOT DETECTED NOT DETECTED Final   Acinetobacter baumannii NOT DETECTED NOT DETECTED Final   Enterobacteriaceae species NOT DETECTED NOT DETECTED Final   Enterobacter cloacae complex NOT DETECTED NOT DETECTED Final   Escherichia coli NOT DETECTED NOT DETECTED Final   Klebsiella oxytoca NOT DETECTED NOT DETECTED Final   Klebsiella pneumoniae NOT DETECTED NOT DETECTED Final   Proteus species NOT DETECTED NOT DETECTED Final   Serratia marcescens NOT DETECTED NOT DETECTED Final   Haemophilus influenzae NOT DETECTED NOT DETECTED Final   Neisseria meningitidis NOT DETECTED NOT DETECTED Final   Pseudomonas aeruginosa NOT DETECTED NOT DETECTED Final   Candida albicans NOT DETECTED NOT DETECTED Final   Candida glabrata NOT DETECTED NOT DETECTED Final   Candida krusei NOT DETECTED NOT DETECTED Final   Candida parapsilosis NOT DETECTED NOT DETECTED Final   Candida tropicalis NOT DETECTED NOT DETECTED Final    Comment: Performed at [facility], [place] [address], [place], [place] [address]  Blood Culture (routine x 2)     Status: None (Preliminary result)   Collection Time: 11/21/17  8:10 PM  Result  Value Ref Range Status   Specimen Description BLOOD SITE NOT SPECIFIED  Final   Special Requests   Final    BOTTLES DRAWN AEROBIC ONLY Blood Culture results may not be optimal due to an inadequate volume of blood received in culture bottles   Culture   Final    NO GROWTH 2 DAYS Performed at [facility], [place] [address], [place], [place] [address]    Report Status PENDING  Incomplete  MRSA PCR Screening     Status: None   Collection Time: 11/21/17  8:19 PM  Result Value Ref Range Status   MRSA by PCR NEGATIVE  NEGATIVE Final    Comment:        The GeneXpert MRSA Assay (FDA approved for NASAL specimens only), is one component of a comprehensive MRSA colonization surveillance program. It is not intended to diagnose MRSA infection nor to guide or monitor treatment for MRSA infections. Performed at [facility], [place] [address], [place], [place] [address]     Radiographs and labs were personally reviewed by me.   Bobby Rumpf, MD [facility] [phone] 11/23/2017, 1:17 PM

## 2017-11-24 ENCOUNTER — Encounter (HOSPITAL_COMMUNITY)
Admission: EM | Disposition: A | Discharge status: Home / Self Care | Payer: Self-pay | Source: Home / Self Care | Attending: Internal Medicine

## 2017-11-24 LAB — BASIC METABOLIC PANEL
Anion gap: 7 (ref 5–15)
BUN: 11 mg/dL (ref 6–20)
CHLORIDE: 107 mmol/L (ref 98–111)
CO2: 23 mmol/L (ref 22–32)
Calcium: 8.2 mg/dL — ABNORMAL LOW (ref 8.9–10.3)
Creatinine, Ser: 1.16 mg/dL (ref 0.61–1.24)
GFR calc Af Amer: >60 mL/min (ref >60)
GFR calc non Af Amer: >60 mL/min (ref >60)
GLUCOSE: 246 mg/dL — AB (ref 70–99)
Potassium: 4 mmol/L (ref 3.5–5.1)
Sodium: 137 mmol/L (ref 135–145)

## 2017-11-24 LAB — CBC
HCT: 29.2 % — ABNORMAL LOW (ref 39.0–52.0)
HEMOGLOBIN: 9.4 g/dL — AB (ref 13.0–17.0)
MCH: 26.7 pg (ref 26.0–34.0)
MCHC: 32.2 g/dL (ref 30.0–36.0)
MCV: 83 fL (ref 78.0–100.0)
Platelets: 474 10*3/uL — ABNORMAL HIGH (ref 150–400)
RBC: 3.52 MIL/uL — AB (ref 4.22–5.81)
RDW: 12.9 % (ref 11.5–15.5)
WBC: 16.8 10*3/uL — ABNORMAL HIGH (ref 4.0–10.5)

## 2017-11-24 LAB — GLUCOSE, CAPILLARY
GLUCOSE-CAPILLARY: 335 mg/dL — AB (ref 70–99)
GLUCOSE-CAPILLARY: 280 mg/dL — AB (ref 70–99)
GLUCOSE-CAPILLARY: 59 mg/dL — AB (ref 70–99)
Glucose-Capillary: 130 mg/dL — ABNORMAL HIGH (ref 70–99)
Glucose-Capillary: 80 mg/dL (ref 70–99)

## 2017-11-24 LAB — SURGICAL PCR SCREEN
MRSA, PCR: NEGATIVE
Staphylococcus aureus: NEGATIVE

## 2017-11-24 SURGERY — IRRIGATION AND DEBRIDEMENT EXTREMITY
Anesthesia: General | Laterality: Left

## 2017-11-24 MED ORDER — INSULIN ASPART 100 UNIT/ML ~~LOC~~ SOLN
5.0000 [IU] | Freq: Three times a day (TID) | SUBCUTANEOUS | Status: DC
  Administered 2017-11-24 (×2): 5 [IU] via SUBCUTANEOUS

## 2017-11-24 MED ORDER — INSULIN ASPART 100 UNIT/ML ~~LOC~~ SOLN: 0.0000 [IU] | Freq: Every day | SUBCUTANEOUS | Status: DC

## 2017-11-24 MED ORDER — INSULIN ASPART 100 UNIT/ML ~~LOC~~ SOLN: 0.0000 [IU] | Freq: Three times a day (TID) | SUBCUTANEOUS | Status: DC

## 2017-11-24 MED ORDER — INSULIN ASPART 100 UNIT/ML ~~LOC~~ SOLN
0.0000 [IU] | Freq: Three times a day (TID) | SUBCUTANEOUS | Status: DC
  Administered 2017-11-24: 8 [IU] via SUBCUTANEOUS

## 2017-11-24 MED ORDER — INSULIN ASPART PROT & ASPART (70-30 MIX) 100 UNIT/ML ~~LOC~~ SUSP
55.0000 [IU] | Freq: Two times a day (BID) | SUBCUTANEOUS | Status: DC
  Administered 2017-11-24 – 2017-11-28 (×8): 55 [IU] via SUBCUTANEOUS

## 2017-11-24 NOTE — Progress Notes (Signed)
Patient was examined this morning.  His abscesses has seemingly already decompressed and as that was the purpose of the surgery we feel that surgery at this point is not indicated.  We advised local wound care with physical therapy performing hydrotherapy to the foot to encourage continued drainage.  Surgery at this point may make the patient's wound healing more complicated in setting of his poorly controlled diabetes.  He has a change in condition we may reassess this.  Patient is happy with this plan of care.  Will likely have Dr. Sharol Given to evaluate the patient and get his opinion either Tuesday when he returns for an outpatient setting.

## 2017-11-24 NOTE — Progress Notes (Signed)
PROGRESS NOTE    NUR KRASINSKI  HUD:[id number] DOB: Apr 17, 1980 DOA: 11/21/2017 PCP: Gildardo Pounds, NP   Brief Narrative: Patient is a 37 year old male with past medical history of IV drug abuse with recent relapse, insulin-dependent diabetes, hypertension who presented to the emergency department with complaints of worsening wound on the left foot.  Patient was injecting cocaine/heroine on the left foot.  Currently was admitted  for osteomyelitis of the left foot, sepsis,DKA.  Orthopedics following and planning for I&D plan of the left foot.  ID consulted for antibiotics management.  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  H/O debridement on left arm for similar presentation in 2014. MRI left foot suggestive of osteomyelitis.  Currently on vancomycin and Zosyn. Ortho was planning for I&D but since the abscess on the left foot spontaneously ruptured, I&D held for now.  Orthopedics reconsidering I&D after discussion with Dr. Sharol Given.  Patient also has tender erythematous nodule with possible underlying abscess on the  Dorsum of right foot. ID following One of the blood cultures showed Streptococcus viridans.  Poorly controlled IDDM with CHY:IFOYDXA on insulin drip which has been stopped.  Currently on insulin 70/30.  Dose increased to 55 units twice daily.  Diabetic coordinator following.   Hemoglobin A1c of 12.1.  Continue to monitor blood  sugars.  Polysubstance IV drug use: Relapsed from 15 months sobriety about 3 weeks ago. Injects cocaine/heroin Cessation counseling performed extensively at admission. Pt ready to quit. CSW consulted. Plan is to move away from [place]. UDS positive for cocaine and marijuana HIV negative.  Acute hepatitis panel pending.  AKI on stage II CKD:Cr 1.31 CKD due to longstanding and poorly controlled DM .Avoid nephrotoxins.    HTN:  Hold lisinopril.  Prn hydralazine  Elevated TSH: 6.831, marginal elevation, but started on synthroid by PCP. Continue synthroid 2mcg.  GERD:  Continue PPI  Diabetic polyneuropathy:  Continue gabapentin  Insomnia:  Continue trazodone    DVT prophylaxis: Lovenox Code Status: Full Family Communication: Wife present at the bedside  disposition Plan: Pending workup from orthopedics, determination of duration of antibiotics per ID   Consultants: Orthopedics, ID  Procedures: None  Antimicrobials: Vancomycin, Zosyn  Subjective: Patient seen and examined the bedside this morning.  Currently afebrile.  Hemodynamically stable.  Swelling on the left foot spontaneously  brusted when he went to the bathroom.  Denies any new complaints.  Objective: Vitals:   11/23/17 1438 11/23/17 2120 11/24/17 0606 11/24/17 1328  BP: 136/69 125/86 (!) 148/86 140/68  Pulse: (!) 102 84 65 70  Resp: 14 18 18 18   Temp: 98.5 F (36.9 C) 98.4 F (36.9 C) 98.4 F (36.9 C) 99 F (37.2 C)  TempSrc: Oral Oral Oral Oral  SpO2: 97% 99% 98% 98%  Weight:      Height:        Intake/Output Summary (Last 24 hours) at 11/24/2017 1332 Last data filed at 11/24/2017 1200 Gross per 24  hour  Intake 4031.78 ml  Output 1800 ml  Net 2231.78 ml   Filed Weights   11/23/17 0227  Weight: 86.8 kg    Examination:  General exam: Appears calm and comfortable ,Not in distress,average built HEENT:PERRL,Oral mucosa moist, Ear/Nose normal on gross exam Respiratory system:  Bilateral equal air entry, normal vesicular breath sounds, no wheezes or crackles  Cardiovascular system: S1 & S2 heard, RRR. No JVD, murmurs, rubs, gallops or clicks. No pedal edema. Gastrointestinal system: Abdomen is nondistended, soft and nontender. No organomegaly or masses felt. Normal bowel sounds heard. Central nervous system: Alert and oriented. No focal neurological deficits. Extremities: Large fluctuant abscess which spontaneously brusted on the dorsal aspect of the left foot.erythematous, tender nodule with possible underlying abscess on the right foot dorsum . PICC line on the right arm skin:  Old surgical scars on bilateral extremities  MSK: Normal muscle bulk,tone ,power Psychiatry: Judgement and insight appear normal. Mood & affect appropriate.     Data Reviewed: I have personally reviewed following labs and imaging studies  CBC: Recent Labs  Lab 11/21/17 1332 11/22/17 1750 11/23/17 0410 11/24/17 0347  WBC 24.1* 19.5* 16.9* 16.8*  NEUTROABS 18.0*  --   --   --   HGB 11.9* 9.3* 9.4* 9.4*  HCT 36.1* 28.9* 28.4* 29.2*  MCV 83.8 83.5 82.1 83.0  PLT 560* 453* 426* 706*   Basic Metabolic Panel: Recent Labs  Lab 11/21/17 1332 11/22/17 1750 11/23/17 0410 11/24/17 0347  NA 131* 138 135 137  K 4.5 3.6 4.5 4.0  CL 95* 107 107 107  CO2 19* 23 20* 23  GLUCOSE 682* 169* 400* 246*  BUN 19 11 10 11   CREATININE 1.95* 1.16 1.31* 1.16  CALCIUM 8.8* 8.2* 8.2* 8.2*   GFR: Estimated Creatinine Clearance: 95.1 mL/min (by C-G formula based on SCr of 1.16 mg/dL). Liver Function Tests: Recent Labs  Lab 11/21/17 1332 11/22/17 1750 11/23/17 0410  AST 12* 15 16  ALT 14 11 11   ALKPHOS 128* 94 88  BILITOT 1.2 0.7 0.4  PROT 6.9 5.5* 5.5*  ALBUMIN 2.9* 2.2* 2.1*   No results for input(s): LIPASE, AMYLASE in the last 168 hours. No results for input(s): AMMONIA in the last 168 hours. Coagulation Profile: No results for input(s): INR, PROTIME in the last 168 hours. Cardiac  Enzymes: No results for input(s): CKTOTAL, CKMB, CKMBINDEX, TROPONINI in the last 168 hours. BNP (last 3 results) No results for input(s): PROBNP in the last 8760 hours. HbA1C: Recent Labs    11/22/17 1750  HGBA1C 12.1*   CBG: Recent Labs  Lab 11/23/17 1238 11/23/17 1644 11/23/17 2216 11/24/17 0746 11/24/17 1148  GLUCAP 399* 340* 148* 335* 280*   Lipid Profile: No results for input(s): CHOL, HDL, LDLCALC, TRIG, CHOLHDL, LDLDIRECT in the last 72 hours. Thyroid Function Tests: No results for input(s): TSH, T4TOTAL, FREET4, T3FREE, THYROIDAB in the last 72 hours. Anemia Panel: No results for input(s): VITAMINB12, FOLATE, FERRITIN, TIBC, IRON, RETICCTPCT in the last 72 hours. Sepsis Labs: Recent Labs  Lab 11/21/17 1349 11/21/17 1957  LATICACIDVEN 1.22 1.77    Recent Results (from the past 240 hour(s))  Blood Culture (routine x 2)     Status: Abnormal (Preliminary result)   Collection Time: 11/21/17  1:30 PM  Result Value Ref Range Status   Specimen Description BLOOD RIGHT HAND  Final   Special Requests   Final    BOTTLES DRAWN AEROBIC AND ANAEROBIC Blood Culture results may not be optimal due to  an excessive volume of blood received in culture bottles   Culture  Setup Time   Final    GRAM POSITIVE COCCI AEROBIC BOTTLE ONLY CRITICAL RESULT CALLED TO, READ BACK BY AND VERIFIED WITH: PHARMD L SEAY 11/22/17 AT 2226 BY CM GRAM POSITIVE RODS ANAEROBIC BOTTLE ONLY CRITICAL RESULT CALLED TO, READ BACK BY AND VERIFIED WITH: D PEARCE,PHARMD AT 1710 11/23/17 BY L BENFIELD    Culture (A)  Final    VIRIDANS STREPTOCOCCUS THE SIGNIFICANCE OF ISOLATING THIS ORGANISM FROM A SINGLE SET OF BLOOD CULTURES WHEN MULTIPLE SETS ARE DRAWN IS UNCERTAIN. PLEASE NOTIFY THE MICROBIOLOGY DEPARTMENT WITHIN ONE WEEK IF SPECIATION AND SENSITIVITIES ARE REQUIRED. GRAM POSITIVE RODS CULTURE REINCUBATED FOR BETTER GROWTH Performed at [facility], [place] [address], [place], [place] [address]     Report Status PENDING  Incomplete  Blood Culture ID Panel (Reflexed)     Status: Abnormal   Collection Time: 11/21/17  1:30 PM  Result Value Ref Range Status   Enterococcus species NOT DETECTED NOT DETECTED Final   Listeria monocytogenes NOT DETECTED NOT DETECTED Final   Staphylococcus species NOT DETECTED NOT DETECTED Final   Staphylococcus aureus NOT DETECTED NOT DETECTED Final   Streptococcus species DETECTED (A) NOT DETECTED Final    Comment: Not Enterococcus species, Streptococcus agalactiae, Streptococcus pyogenes, or Streptococcus pneumoniae. CRITICAL RESULT CALLED TO, READ BACK BY AND VERIFIED WITH: PHARMD L SEAY 11/22/17 AT 2227 BY CM    Streptococcus agalactiae NOT DETECTED NOT DETECTED Final   Streptococcus pneumoniae NOT DETECTED NOT DETECTED Final   Streptococcus pyogenes NOT DETECTED NOT DETECTED Final   Acinetobacter baumannii NOT DETECTED NOT DETECTED Final   Enterobacteriaceae species NOT DETECTED NOT DETECTED Final   Enterobacter cloacae complex NOT DETECTED NOT DETECTED Final   Escherichia coli NOT DETECTED NOT DETECTED Final   Klebsiella oxytoca NOT DETECTED NOT DETECTED Final   Klebsiella pneumoniae NOT DETECTED NOT DETECTED Final   Proteus species NOT DETECTED NOT DETECTED Final   Serratia marcescens NOT DETECTED NOT DETECTED Final   Haemophilus influenzae NOT DETECTED NOT DETECTED Final   Neisseria meningitidis NOT DETECTED NOT DETECTED Final   Pseudomonas aeruginosa NOT DETECTED NOT DETECTED Final   Candida albicans NOT DETECTED NOT DETECTED Final   Candida glabrata NOT DETECTED NOT DETECTED Final   Candida krusei NOT DETECTED NOT DETECTED Final   Candida parapsilosis NOT DETECTED NOT DETECTED Final   Candida tropicalis NOT DETECTED NOT DETECTED Final    Comment: Performed at [facility], [place] [address] [place], [place] [address]  Blood Culture (routine x 2)     Status: None (Preliminary result)   Collection Time: 11/21/17  8:10 PM  Result Value Ref  Range Status   Specimen Description BLOOD SITE NOT SPECIFIED  Final   Special Requests   Final    BOTTLES DRAWN AEROBIC ONLY Blood Culture results may not be optimal due to an inadequate volume of blood received in culture bottles   Culture   Final    NO GROWTH 3 DAYS Performed at [facility], [place] [address], [place], [place] [address]    Report Status PENDING  Incomplete  MRSA PCR Screening     Status: None   Collection Time: 11/21/17  8:19 PM  Result Value Ref Range Status   MRSA by PCR NEGATIVE NEGATIVE Final    Comment:        The GeneXpert MRSA Assay (FDA approved for NASAL specimens only), is one component of  a comprehensive MRSA colonization surveillance program. It is not intended to diagnose MRSA infection nor to guide or monitor treatment for MRSA infections. Performed at [facility], [place] [address], [place], [place] [address]   Aerobic Culture (superficial specimen)     Status: None (Preliminary result)   Collection Time: 11/23/17  1:35 PM  Result Value Ref Range Status   Specimen Description WOUND FOOT LEFT  Final   Special Requests Normal  Final   Gram Stain   Final    MODERATE WBC PRESENT, PREDOMINANTLY PMN MODERATE GRAM POSITIVE RODS FEW GRAM NEGATIVE RODS RARE GRAM POSITIVE COCCI IN PAIRS    Culture   Final    CULTURE REINCUBATED FOR BETTER GROWTH Performed at [facility], [place] [address], [place], [place] [address]    Report Status PENDING  Incomplete  Surgical pcr screen     Status: None   Collection Time: 11/24/17 12:03 AM  Result Value Ref Range Status   MRSA, PCR NEGATIVE NEGATIVE Final   Staphylococcus aureus NEGATIVE NEGATIVE Final    Comment: (NOTE) The Xpert SA Assay (FDA approved for NASAL specimens in patients 83 years of age and older), is one component of a comprehensive surveillance program. It is not intended to diagnose infection nor to guide or monitor treatment. Performed at [facility], [place] [address], [place], [place] [address]          Radiology Studies: No results found.      Scheduled Meds: . famotidine  20 mg Oral BID  . gabapentin  400 mg Oral TID  . insulin aspart protamine- aspart  55 Units Subcutaneous BID WC  . levothyroxine  50 mcg Oral QAC breakfast  . povidone-iodine  2 application Topical Once  . sodium chloride flush  3 mL Intravenous Q12H  . traZODone  50 mg Oral QHS   Continuous Infusions: . 0.9 % NaCl with KCl 40 mEq / L 125 mL/hr (11/24/17 0840)  .  ceFAZolin (ANCEF) IV    . piperacillin-tazobactam (ZOSYN)  IV 3.375 g (11/24/17 0547)  . vancomycin 750 mg (11/24/17 1047)     LOS: 3 days    Time spent: 25 mins.More than 50% of that time was spent in counseling and/or coordination of care.      Shelly Coss, MD [facility] Pager [phone]  If 7PM-7AM, please contact night-coverage [email or web address] Password [email or web address] 11/24/2017, 1:32 PM

## 2017-11-24 NOTE — Progress Notes (Signed)
Inpatient Diabetes Program Recommendations  AACE/ADA: New Consensus Statement on Inpatient Glycemic Control (2015)  Target Ranges:  Prepandial:   less than 140 mg/dL      Peak postprandial:   less than 180 mg/dL (1-2 hours)      Critically ill patients:  140 - 180 mg/dL   Results for Luke [name], Luke [name] (MRN [id number]) as of 11/24/2017 08:25  Ref. Range 11/23/2017 08:16 11/23/2017 12:38 11/23/2017 16:44 11/23/2017 22:16 11/24/2017 07:46  Glucose-Capillary Latest Ref Range: 70 - 99 mg/dL 434 (H) 399 (H) 340 (H) 148 (H) 335 (H)   Review of Glycemic Control  Diabetes history: DM 2 Outpatient Diabetes medications: 70/30 40 units qam, 30 units qpm, Regular insulin SSI tid Current orders for Inpatient glycemic control: 70/30 45 units BID, Novolog 0-15 units tid, Novolog 0-5 units qhs  Inpatient Diabetes Program Recommendations:    Consider increasing 70/30 to 55 units BID. Glucose trends decreased to 148 after 70/30 45 units given, however I think the addition of Novolog correction scale of 11 units that were given helped in the decrease. Fasting glucose still elevated in the 300's this am indicating more of the basal insulin portion of the 70/30.  70/30 is not indicated with meal coverage insulin as the 30 portion of 70/30 is operating as meal coverage. Please d/c meal coverage and overall increase 70/30.  Thanks,  Tama Headings RN, MSN, BC-ADM Inpatient Diabetes Coordinator Team Pager [phone] (8a-5p)

## 2017-11-25 ENCOUNTER — Inpatient Hospital Stay (HOSPITAL_COMMUNITY): Payer: Medicaid Other

## 2017-11-25 DIAGNOSIS — F1990 Other psychoactive substance use, unspecified, uncomplicated: Secondary | ICD-10-CM

## 2017-11-25 DIAGNOSIS — B955 Unspecified streptococcus as the cause of diseases classified elsewhere: Secondary | ICD-10-CM

## 2017-11-25 DIAGNOSIS — A429 Actinomycosis, unspecified: Secondary | ICD-10-CM

## 2017-11-25 DIAGNOSIS — I361 Nonrheumatic tricuspid (valve) insufficiency: Secondary | ICD-10-CM

## 2017-11-25 DIAGNOSIS — R7881 Bacteremia: Secondary | ICD-10-CM

## 2017-11-25 DIAGNOSIS — B9689 Other specified bacterial agents as the cause of diseases classified elsewhere: Secondary | ICD-10-CM

## 2017-11-25 DIAGNOSIS — L03116 Cellulitis of left lower limb: Secondary | ICD-10-CM

## 2017-11-25 DIAGNOSIS — B1920 Unspecified viral hepatitis C without hepatic coma: Secondary | ICD-10-CM

## 2017-11-25 LAB — CBC
HEMATOCRIT: 29.4 % — AB (ref 39.0–52.0)
HEMOGLOBIN: 9.6 g/dL — AB (ref 13.0–17.0)
MCH: 27.1 pg (ref 26.0–34.0)
MCHC: 32.7 g/dL (ref 30.0–36.0)
MCV: 83.1 fL (ref 78.0–100.0)
Platelets: 504 10*3/uL — ABNORMAL HIGH (ref 150–400)
RBC: 3.54 MIL/uL — AB (ref 4.22–5.81)
RDW: 13 % (ref 11.5–15.5)
WBC: 15.8 10*3/uL — AB (ref 4.0–10.5)

## 2017-11-25 LAB — CULTURE, BLOOD (ROUTINE X 2)

## 2017-11-25 LAB — CREATININE, SERUM
Creatinine, Ser: 1.15 mg/dL (ref 0.61–1.24)
GFR calc Af Amer: >60 mL/min (ref >60)

## 2017-11-25 LAB — BASIC METABOLIC PANEL
Anion gap: 9 (ref 5–15)
BUN: 8 mg/dL (ref 6–20)
CHLORIDE: 106 mmol/L (ref 98–111)
CO2: 23 mmol/L (ref 22–32)
CREATININE: 1.19 mg/dL (ref 0.61–1.24)
Calcium: 8.4 mg/dL — ABNORMAL LOW (ref 8.9–10.3)
GFR calc non Af Amer: >60 mL/min (ref >60)
GLUCOSE: 241 mg/dL — AB (ref 70–99)
Potassium: 4 mmol/L (ref 3.5–5.1)
Sodium: 138 mmol/L (ref 135–145)

## 2017-11-25 LAB — VANCOMYCIN, TROUGH: VANCOMYCIN TR: 7 ug/mL — AB (ref 15–20)

## 2017-11-25 LAB — ECHOCARDIOGRAM COMPLETE
HEIGHTINCHES: 69 in
Weight: 3062.3834 oz

## 2017-11-25 LAB — HEPATITIS PANEL, ACUTE
Hep A IgM: NEGATIVE
Hep B C IgM: NEGATIVE
Hepatitis B Surface Ag: NEGATIVE

## 2017-11-25 LAB — GLUCOSE, CAPILLARY
GLUCOSE-CAPILLARY: 246 mg/dL — AB (ref 70–99)
GLUCOSE-CAPILLARY: 127 mg/dL — AB (ref 70–99)
Glucose-Capillary: 192 mg/dL — ABNORMAL HIGH (ref 70–99)

## 2017-11-25 MED ORDER — VANCOMYCIN HCL IN DEXTROSE 1-5 GM/200ML-% IV SOLN
1000.0000 mg | Freq: Three times a day (TID) | INTRAVENOUS | Status: DC
  Administered 2017-11-25: 1000 mg via INTRAVENOUS
  Filled 2017-11-25 (×2): qty 200

## 2017-11-25 MED ORDER — PENICILLIN G POTASSIUM 5000000 UNITS IJ SOLR
4.0000 10*6.[IU] | INTRAVENOUS | Status: DC
  Administered 2017-11-25 – 2017-12-06 (×66): 4 10*6.[IU] via INTRAVENOUS
  Filled 2017-11-25 (×73): qty 4

## 2017-11-25 MED ORDER — LISINOPRIL 10 MG PO TABS
10.0000 mg | ORAL_TABLET | Freq: Every day | ORAL | Status: DC
  Administered 2017-11-25 – 2017-12-01 (×6): 10 mg via ORAL
  Filled 2017-11-25 (×7): qty 1

## 2017-11-25 NOTE — Progress Notes (Signed)
Hypoglycemic Event  CBG: 59  Treatment: 15 GM carbohydrate snack  Symptoms: None  Follow-up CBG: Time:2204 CBG Result:80  Possible Reasons for Event: Other: Insulin dosages changed on 9/8 by MD  Comments/MD notified:[place],NP notified. No new orders placed. Will continue to monitor and treat by MD orders.    [place]

## 2017-11-25 NOTE — Progress Notes (Signed)
  Echocardiogram 2D Echocardiogram has been performed.  Luke [name] 11/25/2017, 11:45 AM

## 2017-11-25 NOTE — Care Management Note (Signed)
Case Management Note  Patient Details  Name: Luke [name] MRN: [id number] Date of Birth: 03-May-1980  Subjective/Objective:        Sepsis/ cellulitis. Hx of IV drug abuse with recent relapse, insulin-dependent diabetes, hypertension. Resides with nurse wife, Elmo Putt.  Garnet Overfield (Spouse)      [phone]      PCP: Geryl Rankins  Action/Plan: Transition to home when medically stable. Pt without insurance, follows @ [facility] for primary care. Declined charity care for home health services. States wife who is a Marine scientist can care for him @ d/c. Post hospital follow up scheduled for 12/05/2017 @3 :30 pm, [place].  Expected Discharge Date:                  Expected Discharge Plan:  Home/Self Care  In-House Referral:     Discharge planning Services  CM Consult, [facility], Follow-up appt scheduled  Post Acute Care Choice:    Choice offered to:  Patient(Pt declined charity for home health services. States wife Investment banker, corporate) will care for me.)  DME Arranged:    DME Agency:     HH Arranged:    [place] Agency:     Status of Service:  In process, will continue to follow  If discussed at Long Length of Stay Meetings, dates discussed:    Additional Comments:  Sharin Mons, RN 11/25/2017, 12:37 PM

## 2017-11-25 NOTE — Progress Notes (Signed)
Patient reports much improved clinical exam regards to both feet.  He reports he is walking without pain.  White count is improving.  We will continue using antibiotics for treatment currently.  If he is still in house Dr. Sharol Given to agreed to consult tomorrow.  Otherwise he can follow-up with Dr. Sharol Given to in the outpatient setting.

## 2017-11-25 NOTE — Progress Notes (Signed)
Subjective: No new complaints, wants to go home  Antibiotics:  Anti-infectives (From admission, onward)   Start     Dose/Rate Route Frequency Ordered Stop   11/25/17 1400  penicillin G potassium 4 Million Units in dextrose 5 % 250 mL IVPB     4 Million Units 250 mL/hr over 60 Minutes Intravenous Every 4 hours 11/25/17 1254     11/25/17 0800  vancomycin (VANCOCIN) IVPB 1000 mg/200 mL premix  Status:  Discontinued     1,000 mg 200 mL/hr over 60 Minutes Intravenous Every 8 hours 11/25/17 0129 11/25/17 1413   11/24/17 0600  ceFAZolin (ANCEF) IVPB 2g/100 mL premix     2 g 200 mL/hr over 30 Minutes Intravenous On call to O.R. 11/23/17 2306 11/25/17 0559   11/23/17 1400  piperacillin-tazobactam (ZOSYN) IVPB 3.375 g  Status:  Discontinued     3.375 g 12.5 mL/hr over 240 Minutes Intravenous Every 8 hours 11/23/17 1031 11/25/17 1254   11/23/17 0000  metroNIDAZOLE (FLAGYL) IVPB 500 mg  Status:  Discontinued     500 mg 100 mL/hr over 60 Minutes Intravenous Every 8 hours 11/22/17 2312 11/23/17 1014   11/22/17 2315  cefTRIAXone (ROCEPHIN) 2 g in sodium chloride 0.9 % 100 mL IVPB  Status:  Discontinued     2 g 200 mL/hr over 30 Minutes Intravenous Every 24 hours 11/22/17 2312 11/23/17 1014   11/22/17 1400  piperacillin-tazobactam (ZOSYN) IVPB 3.375 g  Status:  Discontinued     3.375 g 12.5 mL/hr over 240 Minutes Intravenous Every 8 hours 11/22/17 1043 11/22/17 2311   11/22/17 1045  vancomycin (VANCOCIN) IVPB 750 mg/150 ml premix  Status:  Discontinued     750 mg 150 mL/hr over 60 Minutes Intravenous Every 12 hours 11/22/17 1043 11/25/17 0129   11/21/17 1830  vancomycin (VANCOCIN) IVPB 750 mg/150 ml premix     750 mg 150 mL/hr over 60 Minutes Intravenous  Once 11/21/17 1816 11/21/17 2030   11/21/17 1715  vancomycin (VANCOCIN) IVPB 1000 mg/200 mL premix     1,000 mg 200 mL/hr over 60 Minutes Intravenous  Once 11/21/17 1710 11/21/17 1914   11/21/17 1715  piperacillin-tazobactam  (ZOSYN) IVPB 3.375 g     3.375 g 12.5 mL/hr over 240 Minutes Intravenous  Once 11/21/17 1710 11/21/17 2030      Medications: Scheduled Meds: . famotidine  20 mg Oral BID  . gabapentin  400 mg Oral TID  . insulin aspart protamine- aspart  55 Units Subcutaneous BID WC  . levothyroxine  50 mcg Oral QAC breakfast  . lisinopril  10 mg Oral Daily  . povidone-iodine  2 application Topical Once  . sodium chloride flush  3 mL Intravenous Q12H  . traZODone  50 mg Oral QHS   Continuous Infusions: . pencillin G potassium IV 4 Million Units (11/25/17 1439)   PRN Meds:.acetaminophen **OR** acetaminophen, dextrose, hydrALAZINE, ondansetron **OR** ondansetron (ZOFRAN) IV, sodium chloride flush, sodium chloride flush, traMADol    Objective: Weight change:   Intake/Output Summary (Last 24 hours) at 11/25/2017 1606 Last data filed at 11/25/2017 1600 Gross per 24 hour  Intake 3625.1 ml  Output 2250 ml  Net 1375.1 ml   Blood pressure (!) 142/77, pulse 73, temperature 98.5 F (36.9 C), temperature source Oral, resp. rate 13, height 5\' 9"  (1.753 m), weight 86.8 kg, SpO2 100 %. Temp:  [98.1 F (36.7 C)-98.5 F (36.9 C)] 98.5 F (36.9 C) (09/09 1438) Pulse Rate:  [63-73] 73 (  09/09 1438) Resp:  [13-20] 13 (09/09 1438) BP: (137-157)/(76-86) 142/77 (09/09 1438) SpO2:  [100 %] 100 % (09/09 1438)  Physical Exam: General: Alert and awake, oriented x3, not in any acute distress. HEENT: anicteric sclera, EOMI CVS regular rate, normal  Chest: , no wheezing, no respiratory distress Abdomen: soft non-distended,  Extremities: Skin: mx track marks  Left foot 11/25/17:          Neuro: nonfocal  CBC:    BMET Recent Labs    11/23/17 0410 11/24/17 0347 11/25/17 0319  NA 135 137  --   K 4.5 4.0  --   CL 107 107  --   CO2 20* 23  --   GLUCOSE 400* 246*  --   BUN 10 11  --   CREATININE 1.31* 1.16 1.15  CALCIUM 8.2* 8.2*  --      Liver Panel  Recent Labs    11/22/17 1750  11/23/17 0410  PROT 5.5* 5.5*  ALBUMIN 2.2* 2.1*  AST 15 16  ALT 11 11  ALKPHOS 94 88  BILITOT 0.7 0.4       Sedimentation Rate No results for input(s): ESRSEDRATE in the last 72 hours. C-Reactive Protein Recent Labs    11/22/17 1750 11/23/17 0410  CRP 12.2* 9.0*    Micro Results: Recent Results (from the past 720 hour(s))  Blood Culture (routine x 2)     Status: Abnormal   Collection Time: 11/21/17  1:30 PM  Result Value Ref Range Status   Specimen Description BLOOD RIGHT HAND  Final   Special Requests   Final    BOTTLES DRAWN AEROBIC AND ANAEROBIC Blood Culture results may not be optimal due to an excessive volume of blood received in culture bottles   Culture  Setup Time   Final    GRAM POSITIVE COCCI AEROBIC BOTTLE ONLY CRITICAL RESULT CALLED TO, READ BACK BY AND VERIFIED WITH: PHARMD L SEAY 11/22/17 AT 2226 BY CM GRAM POSITIVE RODS ANAEROBIC BOTTLE ONLY CRITICAL RESULT CALLED TO, READ BACK BY AND VERIFIED WITH: D PEARCE,PHARMD AT 1710 11/23/17 BY L BENFIELD    Culture (A)  Final    VIRIDANS STREPTOCOCCUS THE SIGNIFICANCE OF ISOLATING THIS ORGANISM FROM A SINGLE SET OF BLOOD CULTURES WHEN MULTIPLE SETS ARE DRAWN IS UNCERTAIN. PLEASE NOTIFY THE MICROBIOLOGY DEPARTMENT WITHIN ONE WEEK IF SPECIATION AND SENSITIVITIES ARE REQUIRED. ACTINOMYCES NAESLUNDII Standardized susceptibility testing for this organism is not available. Performed at [facility], [place] [address] [place], [place] [address]    Report Status 11/25/2017 FINAL  Final  Blood Culture ID Panel (Reflexed)     Status: Abnormal   Collection Time: 11/21/17  1:30 PM  Result Value Ref Range Status   Enterococcus species NOT DETECTED NOT DETECTED Final   Listeria monocytogenes NOT DETECTED NOT DETECTED Final   Staphylococcus species NOT DETECTED NOT DETECTED Final   Staphylococcus aureus NOT DETECTED NOT DETECTED Final   Streptococcus species DETECTED (A) NOT DETECTED Final    Comment: Not Enterococcus  species, Streptococcus agalactiae, Streptococcus pyogenes, or Streptococcus pneumoniae. CRITICAL RESULT CALLED TO, READ BACK BY AND VERIFIED WITH: PHARMD L SEAY 11/22/17 AT 2227 BY CM    Streptococcus agalactiae NOT DETECTED NOT DETECTED Final   Streptococcus pneumoniae NOT DETECTED NOT DETECTED Final   Streptococcus pyogenes NOT DETECTED NOT DETECTED Final   Acinetobacter baumannii NOT DETECTED NOT DETECTED Final   Enterobacteriaceae species NOT DETECTED NOT DETECTED Final   Enterobacter cloacae complex NOT DETECTED NOT DETECTED Final  Escherichia coli NOT DETECTED NOT DETECTED Final   Klebsiella oxytoca NOT DETECTED NOT DETECTED Final   Klebsiella pneumoniae NOT DETECTED NOT DETECTED Final   Proteus species NOT DETECTED NOT DETECTED Final   Serratia marcescens NOT DETECTED NOT DETECTED Final   Haemophilus influenzae NOT DETECTED NOT DETECTED Final   Neisseria meningitidis NOT DETECTED NOT DETECTED Final   Pseudomonas aeruginosa NOT DETECTED NOT DETECTED Final   Candida albicans NOT DETECTED NOT DETECTED Final   Candida glabrata NOT DETECTED NOT DETECTED Final   Candida krusei NOT DETECTED NOT DETECTED Final   Candida parapsilosis NOT DETECTED NOT DETECTED Final   Candida tropicalis NOT DETECTED NOT DETECTED Final    Comment: Performed at [facility], [place] [address] [place], [place] [address]  Blood Culture (routine x 2)     Status: None (Preliminary result)   Collection Time: 11/21/17  8:10 PM  Result Value Ref Range Status   Specimen Description BLOOD SITE NOT SPECIFIED  Final   Special Requests   Final    BOTTLES DRAWN AEROBIC ONLY Blood Culture results may not be optimal due to an inadequate volume of blood received in culture bottles   Culture   Final    NO GROWTH 4 DAYS Performed at [facility], [place] [address], [place], [place] [address]    Report Status PENDING  Incomplete  MRSA PCR Screening     Status: None   Collection Time: 11/21/17  8:19 PM  Result  Value Ref Range Status   MRSA by PCR NEGATIVE NEGATIVE Final    Comment:        The GeneXpert MRSA Assay (FDA approved for NASAL specimens only), is one component of a comprehensive MRSA colonization surveillance program. It is not intended to diagnose MRSA infection nor to guide or monitor treatment for MRSA infections. Performed at [facility], [place] [address], [place], [place] [address]   Aerobic Culture (superficial specimen)     Status: None (Preliminary result)   Collection Time: 11/23/17  1:35 PM  Result Value Ref Range Status   Specimen Description WOUND FOOT LEFT  Final   Special Requests Normal  Final   Gram Stain   Final    MODERATE WBC PRESENT, PREDOMINANTLY PMN MODERATE GRAM POSITIVE RODS FEW GRAM NEGATIVE RODS RARE GRAM POSITIVE COCCI IN PAIRS    Culture   Final    CULTURE REINCUBATED FOR BETTER GROWTH Performed at [facility], [place] [address], [place], [place] [address]    Report Status PENDING  Incomplete  Surgical pcr screen     Status: None   Collection Time: 11/24/17 12:03 AM  Result Value Ref Range Status   MRSA, PCR NEGATIVE NEGATIVE Final   Staphylococcus aureus NEGATIVE NEGATIVE Final    Comment: (NOTE) The Xpert SA Assay (FDA approved for NASAL specimens in patients 50 years of age and older), is one component of a comprehensive surveillance program. It is not intended to diagnose infection nor to guide or monitor treatment. Performed at [facility], [place] [address], [place],  [address]     Studies/Results: No results found.    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 male with IV drug use and foot infection also with bacteremia with actinomyces species.  Note actinomyces species are a HACEK organism that causes endocarditis and this organisms is typically treated even OUTSIDE setting of endocarditis with 6 weeks of full penicillin followed by 6 months to 11 once with a penicillin.  Despite the patient denying licking the needles that he probably is so doing f he has actinomyces in his foot  We are obtaining a transthoracic echocardiogram if this is negative the patient is willing to undergo a transesophageal echocardiogram.  He sounds only willing to be treated with IV antibiotics in the hospital if he has a heart valve infection otherwise he is very insistent on going back home.  He is adamant that he had a minor slip up after being clean for multiple years.  He states that he became sober while he was in jail and had a slip up just recently.  Hepatitis C seropositivity we will check a hepatitis C RNA.  This can be addressed as an outpatient. (he also denies ever sharing them)  I spent greater than 35  minutes with the patient including greater than 50% of time in face to face counsel of the patient re sure this type of infection as well as management of it as well as need to treat his hepatitis C potentially and for him to may remain sober from intravenous drug use.  And in coordination of their care.    LOS: 4 days   Alcide Evener 11/25/2017, 4:06 PM

## 2017-11-25 NOTE — Progress Notes (Addendum)
PROGRESS NOTE    Luke [name]  WRU:[id number] DOB: 04/22/1980 DOA: 11/21/2017 PCP: Gildardo Pounds, NP   Brief Narrative: Patient is a 37 year old male with past medical history of IV drug abuse with recent relapse, insulin-dependent diabetes, hypertension who presented to the emergency department with complaints of worsening wound on the left foot.  Patient was injecting cocaine/heroine on the left foot.  He was admitted  For possible  osteomyelitis of the left foot, sepsis,DKA.  Orthopedics following.  ID consulted for antibiotics management.Blood culture now showing actinomyces.  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  H/O debridement on left arm for similar presentation in 2014. MRI left foot showed edema and enhancement the medial malleolus consistent with osteomyelitis.But orthopedics are not that convinced with this finding.  Ortho was planning for I&D but since the abscess on the left foot spontaneously ruptured, I&D held for now.  Orthopedics reconsidering I&D after discussion with Dr. Sharol Given tomorrow.  Patient also has tender erythematous nodule with possible underlying abscess on the  Dorsum of right foot.  Bacteremia: Blood culture was showing Streptococcus viridans which was thought to be contaminant but one of the cultures  has also shown ACTINOMYCES  NAESLUNDII . ID following Antibiotics changed to penicillin G.  TTE did not show endocarditis,I have requested cardiology for  TEE.  Poorly controlled IDDM with  BJY:NWGNFAO on insulin drip which has been stopped.  Currently on insulin 70/30.  Dose increased to 55 units twice daily.  Diabetic coordinator following.   Hemoglobin A1c of 12.1.  Continue to monitor blood sugars.  Polysubstance IV drug use: Relapsed from 15 months sobriety about 3 weeks ago. Injects cocaine/heroin Cessation counseling performed extensively at admission. Pt ready to quit. CSW consulted. Plan is to move away from [place]. UDS positive for cocaine and marijuana HIV negative.  Hepatitis panel showed HCV.  Will check viral load PCR with genetics  HTN:  On lisinopril.  Prn hydralazine  Elevated TSH: 6.831, marginal elevation, but started on synthroid by PCP. Continue synthroid 93mcg.  GERD:  Continue PPI  Diabetic polyneuropathy:  Continue gabapentin  Insomnia:  Continue trazodone    DVT prophylaxis: Lovenox Code Status: Full Family Communication: None present at the bedside  disposition Plan: Pending workup .Final recommendation from orthopedics, determination of duration of antibiotics per ID.  Patient might need prolonged hospital stay due to IV drug abuse.  He cannot be discharged on PICC line.  Most likely patient will sign AMA in coming days   Consultants: Orthopedics, ID  Procedures: None  Antimicrobials: Penicillin G day 1  Subjective: Patient seen and examined the bedside this morning.  Desperately wants to go home.  Very frustrated after being told that he needs to stay in the hospital for more work-up.  Wounds on his bilateral lower extremities have been improving.  Objective: Vitals:   11/24/17 1328 11/24/17 2148 11/25/17 0450 11/25/17 1438  BP: 140/68 137/76 (!) 157/86 (!) 142/77  Pulse: 70 63 71 73  Resp: 18 18 20  13  Temp: 99 F (37.2 C) 98.4 F (36.9 C) 98.1 F (36.7 C) 98.5 F (36.9 C)  TempSrc: Oral Oral Oral Oral  SpO2: 98%  100% 100%  Weight:      Height:        Intake/Output Summary (Last 24 hours) at 11/25/2017  1452 Last data filed at 11/25/2017 1441 Gross per 24 hour  Intake 2643.1 ml  Output 2250 ml  Net 393.1 ml   Filed Weights   11/23/17 0227  Weight: 86.8 kg    Examination:  General exam: Appears calm and comfortable ,Not in distress,average built HEENT:PERRL,Oral mucosa moist, Ear/Nose normal on gross exam Respiratory system: Bilateral equal air entry, normal vesicular breath sounds, no wheezes or crackles  Cardiovascular system: S1 & S2 heard, RRR. No JVD, murmurs, rubs, gallops or clicks. Gastrointestinal system: Abdomen is nondistended, soft and nontender. No organomegaly or masses felt. Normal bowel sounds heard. Central nervous system: Alert and oriented. No focal neurological deficits. Extremities:Large fluctuant abscess which spontaneously brusted on the dorsal aspect of the left foot.Erythematous, tender nodule with possible underlying abscess on the right foot dorsum . PICC line on the right arm skin:  Old surgical scars on bilateral extremities MSK: Normal muscle bulk,tone ,power Psychiatry: Judgement and insight appear normal. Mood & affect appropriate.     Data Reviewed: I have personally reviewed following labs and imaging studies  CBC: Recent Labs  Lab 11/21/17 1332 11/22/17 1750 11/23/17 0410 11/24/17 0347 11/25/17 0319  WBC 24.1* 19.5* 16.9* 16.8* 15.8*  NEUTROABS 18.0*  --   --   --   --   HGB 11.9* 9.3* 9.4* 9.4* 9.6*  HCT 36.1* 28.9* 28.4* 29.2* 29.4*  MCV 83.8 83.5 82.1 83.0 83.1  PLT 560* 453* 426* 474* 793*   Basic Metabolic Panel: Recent Labs  Lab 11/21/17 1332 11/22/17 1750 11/23/17 0410 11/24/17 0347 11/25/17 0319  NA 131* 138 135 137  --   K 4.5 3.6 4.5 4.0  --   CL 95* 107 107 107  --   CO2 19* 23 20* 23  --   GLUCOSE 682* 169* 400* 246*  --   BUN 19 11 10 11   --   CREATININE 1.95* 1.16 1.31* 1.16 1.15  CALCIUM 8.8* 8.2* 8.2* 8.2*  --    GFR: Estimated Creatinine Clearance: 95.9 mL/min (by C-G formula based on SCr of 1.15  mg/dL). Liver Function Tests: Recent Labs  Lab 11/21/17 1332 11/22/17 1750 11/23/17 0410  AST 12* 15 16  ALT 14 11 11   ALKPHOS 128* 94 88  BILITOT 1.2 0.7 0.4  PROT 6.9 5.5* 5.5*  ALBUMIN 2.9* 2.2* 2.1*   No results for input(s): LIPASE, AMYLASE in the last 168 hours. No results for input(s): AMMONIA in the last 168 hours. Coagulation Profile: No results for input(s): INR, PROTIME in the last 168 hours. Cardiac Enzymes: No results for input(s): CKTOTAL, CKMB, CKMBINDEX, TROPONINI in the last 168 hours. BNP (last 3 results) No results for input(s): PROBNP in the last 8760 hours. HbA1C: Recent Labs    11/22/17 1750  HGBA1C 12.1*   CBG: Recent Labs  Lab 11/24/17 1558 11/24/17 2149 11/24/17 2204 11/25/17 0806 11/25/17 1220  GLUCAP 130* 59* 80 246* 192*   Lipid Profile: No results for input(s): CHOL, HDL, LDLCALC, TRIG, CHOLHDL, LDLDIRECT in the last 72 hours. Thyroid Function Tests: No results for input(s): TSH, T4TOTAL, FREET4, T3FREE, THYROIDAB in the last 72 hours. Anemia Panel: No results for input(s): VITAMINB12, FOLATE, FERRITIN, TIBC, IRON, RETICCTPCT  in the last 72 hours. Sepsis Labs: Recent Labs  Lab 11/21/17 1349 11/21/17 1957  LATICACIDVEN 1.22 1.77    Recent Results (from the past 240 hour(s))  Blood Culture (routine x 2)     Status: Abnormal   Collection Time: 11/21/17  1:30 PM  Result Value Ref Range Status   Specimen Description BLOOD RIGHT HAND  Final   Special Requests   Final    BOTTLES DRAWN AEROBIC AND ANAEROBIC Blood Culture results may not be optimal due to an excessive volume of blood received in culture bottles   Culture  Setup Time   Final    GRAM POSITIVE COCCI AEROBIC BOTTLE ONLY CRITICAL RESULT CALLED TO, READ BACK BY AND VERIFIED WITH: PHARMD L SEAY 11/22/17 AT 2226 BY CM GRAM POSITIVE RODS ANAEROBIC BOTTLE ONLY CRITICAL RESULT CALLED TO, READ BACK BY AND VERIFIED WITH: D PEARCE,PHARMD AT 1710 11/23/17 BY L BENFIELD    Culture  (A)  Final    VIRIDANS STREPTOCOCCUS THE SIGNIFICANCE OF ISOLATING THIS ORGANISM FROM A SINGLE SET OF BLOOD CULTURES WHEN MULTIPLE SETS ARE DRAWN IS UNCERTAIN. PLEASE NOTIFY THE MICROBIOLOGY DEPARTMENT WITHIN ONE WEEK IF SPECIATION AND SENSITIVITIES ARE REQUIRED. ACTINOMYCES NAESLUNDII Standardized susceptibility testing for this organism is not available. Performed at [facility], [place] [address] [place], [place] [address]    Report Status 11/25/2017 FINAL  Final  Blood Culture ID Panel (Reflexed)     Status: Abnormal   Collection Time: 11/21/17  1:30 PM  Result Value Ref Range Status   Enterococcus species NOT DETECTED NOT DETECTED Final   Listeria monocytogenes NOT DETECTED NOT DETECTED Final   Staphylococcus species NOT DETECTED NOT DETECTED Final   Staphylococcus aureus NOT DETECTED NOT DETECTED Final   Streptococcus species DETECTED (A) NOT DETECTED Final    Comment: Not Enterococcus species, Streptococcus agalactiae, Streptococcus pyogenes, or Streptococcus pneumoniae. CRITICAL RESULT CALLED TO, READ BACK BY AND VERIFIED WITH: PHARMD L SEAY 11/22/17 AT 2227 BY CM    Streptococcus agalactiae NOT DETECTED NOT DETECTED Final   Streptococcus pneumoniae NOT DETECTED NOT DETECTED Final   Streptococcus pyogenes NOT DETECTED NOT DETECTED Final   Acinetobacter baumannii NOT DETECTED NOT DETECTED Final   Enterobacteriaceae species NOT DETECTED NOT DETECTED Final   Enterobacter cloacae complex NOT DETECTED NOT DETECTED Final   Escherichia coli NOT DETECTED NOT DETECTED Final   Klebsiella oxytoca NOT DETECTED NOT DETECTED Final   Klebsiella pneumoniae NOT DETECTED NOT DETECTED Final   Proteus species NOT DETECTED NOT DETECTED Final   Serratia marcescens NOT DETECTED NOT DETECTED Final   Haemophilus influenzae NOT DETECTED NOT DETECTED Final   Neisseria meningitidis NOT DETECTED NOT DETECTED Final   Pseudomonas aeruginosa NOT DETECTED NOT DETECTED Final   Candida albicans NOT  DETECTED NOT DETECTED Final   Candida glabrata NOT DETECTED NOT DETECTED Final   Candida krusei NOT DETECTED NOT DETECTED Final   Candida parapsilosis NOT DETECTED NOT DETECTED Final   Candida tropicalis NOT DETECTED NOT DETECTED Final    Comment: Performed at [facility], [place] [address] [place], [place] [address]  Blood Culture (routine x 2)     Status: None (Preliminary result)   Collection Time: 11/21/17  8:10 PM  Result Value Ref Range Status   Specimen Description BLOOD SITE NOT SPECIFIED  Final   Special Requests   Final    BOTTLES DRAWN AEROBIC ONLY Blood Culture results may not be optimal due to an inadequate volume of blood received in culture  bottles   Culture   Final    NO GROWTH 4 DAYS Performed at [facility], [place] [address], [place], [place] [address]    Report Status PENDING  Incomplete  MRSA PCR Screening     Status: None   Collection Time: 11/21/17  8:19 PM  Result Value Ref Range Status   MRSA by PCR NEGATIVE NEGATIVE Final    Comment:        The GeneXpert MRSA Assay (FDA approved for NASAL specimens only), is one component of a comprehensive MRSA colonization surveillance program. It is not intended to diagnose MRSA infection nor to guide or monitor treatment for MRSA infections. Performed at [facility], [place] [address] [place] [address]   Aerobic Culture (superficial specimen)     Status: None (Preliminary result)   Collection Time: 11/23/17  1:35 PM  Result Value Ref Range Status   Specimen Description WOUND FOOT LEFT  Final   Special Requests Normal  Final   Gram Stain   Final    MODERATE WBC PRESENT, PREDOMINANTLY PMN MODERATE GRAM POSITIVE RODS FEW GRAM NEGATIVE RODS RARE GRAM POSITIVE COCCI IN PAIRS    Culture   Final    CULTURE REINCUBATED FOR BETTER GROWTH Performed at [facility], [place] [address] [place], [place] [address]    Report Status PENDING  Incomplete  Surgical pcr screen     Status: None    Collection Time: 11/24/17 12:03 AM  Result Value Ref Range Status   MRSA, PCR NEGATIVE NEGATIVE Final   Staphylococcus aureus NEGATIVE NEGATIVE Final    Comment: (NOTE) The Xpert SA Assay (FDA approved for NASAL specimens in patients 69 years of age and older), is one component of a comprehensive surveillance program. It is not intended to diagnose infection nor to guide or monitor treatment. Performed at [facility], [place] [address], [place], [place] [address]          Radiology Studies: No results found.      Scheduled Meds: . famotidine  20 mg Oral BID  . gabapentin  400 mg Oral TID  . insulin aspart protamine- aspart  55 Units Subcutaneous BID WC  . levothyroxine  50 mcg Oral QAC breakfast  . povidone-iodine  2 application Topical Once  . sodium chloride flush  3 mL Intravenous Q12H  . traZODone  50 mg Oral QHS   Continuous Infusions: . pencillin G potassium IV 4 Million Units (11/25/17 1439)     LOS: 4 days    Time spent: 25 mins.More than 50% of that time was spent in counseling and/or coordination of care.      Shelly Coss, MD [facility] Pager [phone]  If 7PM-7AM, please contact night-coverage [email or web address] Password [email or web address] 11/25/2017, 2:52 PM

## 2017-11-25 NOTE — Progress Notes (Signed)
Patient walked 575 feet in the hallway with staff. Tolerated well.

## 2017-11-25 NOTE — Progress Notes (Signed)
ANTIBIOTIC CONSULT NOTE   Pharmacy Consult for vancomycin Indication: Wound infection  No Known Allergies  Patient Measurements: Height: 5\' 9"  (175.3 cm) Weight: 191 lb 6.4 oz (86.8 kg) IBW/kg (Calculated) : 70.7   Vital Signs: Temp: 98.4 F (36.9 C) (09/08 2148) Temp Source: Oral (09/08 2148) BP: 137/76 (09/08 2148) Pulse Rate: 63 (09/08 2148) Intake/Output from previous day: 09/08 0701 - 09/09 0700 In: 120 [P.O.:120] Out: 800 [Urine:800] Intake/Output from this shift: Total I/O In: -  Out: 100 [Urine:100]  Labs: Recent Labs    11/22/17 1750 11/23/17 0410 11/24/17 0347  WBC 19.5* 16.9* 16.8*  HGB 9.3* 9.4* 9.4*  PLT 453* 426* 474*  CREATININE 1.16 1.31* 1.16   Estimated Creatinine Clearance: 95.1 mL/min (by C-G formula based on SCr of 1.16 mg/dL). Recent Labs    11/24/17 2230  [place] 7*       Medical History: Past Medical History:  Diagnosis Date  . Anemia   . Anxiety  Dx 2008  . Diabetes mellitus without complication ([place]) Dx 7681  . DKA (diabetic ketoacidoses) ([place]) 07/21/2017  . GERD (gastroesophageal reflux disease) Dx 2008  . Headache(784.0)   . Pneumonia   . Seizures ([place]) 2011    x 2 in lifetime. on Dilantin for a while.   . Substance abuse ([place]) 2013    heroin use, multiple relapses   Assessment: 37 y.o. male who [facility] to [facility] on [facility]2017 with a chief complaint of sepsis from LE wounds. Vanc trough 7 mg/L  Plan:  Change vancomycin to 1gm IV q8 hours F/u renal function, cultures and clinical course  Thanks for allowing pharmacy to be a part of this patient's care.  Excell Seltzer, PharmD Clinical Pharmacist 11/25/2017,1:29 AM

## 2017-11-25 NOTE — Progress Notes (Addendum)
Inpatient Diabetes Program Recommendations  AACE/ADA: New Consensus Statement on Inpatient Glycemic Control (2015)  Target Ranges:  Prepandial:   less than 140 mg/dL      Peak postprandial:   less than 180 mg/dL (1-2 hours)      Critically ill patients:  140 - 180 mg/dL   Lab Results  Component Value Date   GLUCAP 246 (H) 11/25/2017   HGBA1C 12.1 (H) 11/22/2017    Review of Glycemic Control Results for WYAT, INFINGER (MRN [id number]) as of 11/25/2017 10:31  Ref. Range 11/24/2017 15:58 11/24/2017 21:49 11/24/2017 22:04 11/25/2017 08:06  Glucose-Capillary Latest Ref Range: 70 - 99 mg/dL 130 (H) 59 (L) 80 246 (H)   Diabetes history: DM 2 Outpatient Diabetes medications: 70/30 40 units qam, 30 units qpm, Regular insulin SSI tid Current orders for Inpatient glycemic control: 70/30 55 units BID  Inpatient Diabetes Program Recommendations:     Noted patient experienced hypoglycemia of 59 mg/dL, following increase to 70/30 to 55 units BID. This was also following additional meal coverage that was administered at lunch. Will continue to watch trends. No additional recs at this time.   Will plan to see patient today.   Thanks, Bronson Curb, MSN, RNC-OB Diabetes Coordinator [phone] (8a-5p)  Addendum @ 1300: Spoke with patient regarding outpatient regimen. Patient confirms that he was taking 70/30 and regular per SSI TID. Admits to occasionally missing doses, however, states that his last A1C was 10%. Feels this may be related to his infection.  Reviewed patient's current A1c of 12.1%. Explained what a A1c is and what it measures. Also reviewed goal A1c with patient, importance of good glucose control @ home, and blood sugar goals. Reviewed of patho of DM, need for insulin, importance of checking BS, role of pancreas, differences between short acting vs intermediate acting, the impact of infection to blood sugars, and comorbidites.   Patient has a meter and needed supplies, however was not  checking blood sugars. Encouraged to begin checking 2-3 times per day. Patient has follow up scheduled with PCP and plans to return with BS log. Stressed the need for improved control. Has no further needs related to DM at this time.

## 2017-11-26 ENCOUNTER — Ambulatory Visit (INDEPENDENT_AMBULATORY_CARE_PROVIDER_SITE_OTHER): Payer: Self-pay | Admitting: Physician Assistant

## 2017-11-26 DIAGNOSIS — E1165 Type 2 diabetes mellitus with hyperglycemia: Secondary | ICD-10-CM

## 2017-11-26 DIAGNOSIS — E1142 Type 2 diabetes mellitus with diabetic polyneuropathy: Secondary | ICD-10-CM

## 2017-11-26 DIAGNOSIS — L0390 Cellulitis, unspecified: Secondary | ICD-10-CM

## 2017-11-26 DIAGNOSIS — B182 Chronic viral hepatitis C: Secondary | ICD-10-CM

## 2017-11-26 DIAGNOSIS — L03115 Cellulitis of right lower limb: Secondary | ICD-10-CM

## 2017-11-26 DIAGNOSIS — L03116 Cellulitis of left lower limb: Secondary | ICD-10-CM

## 2017-11-26 DIAGNOSIS — A429 Actinomycosis, unspecified: Secondary | ICD-10-CM

## 2017-11-26 DIAGNOSIS — Z720 Tobacco use: Secondary | ICD-10-CM

## 2017-11-26 DIAGNOSIS — L0291 Cutaneous abscess, unspecified: Secondary | ICD-10-CM

## 2017-11-26 DIAGNOSIS — M86072 Acute hematogenous osteomyelitis, left ankle and foot: Secondary | ICD-10-CM

## 2017-11-26 DIAGNOSIS — A419 Sepsis, unspecified organism: Secondary | ICD-10-CM

## 2017-11-26 DIAGNOSIS — E1129 Type 2 diabetes mellitus with other diabetic kidney complication: Secondary | ICD-10-CM

## 2017-11-26 LAB — CULTURE, BLOOD (ROUTINE X 2): CULTURE: NO GROWTH

## 2017-11-26 LAB — HCV RNA QUANT
HCV QUANT: 4410000 [IU]/mL (ref >50)
HCV Quantitative Log: 6.644 log10 IU/mL (ref >1.70)

## 2017-11-26 LAB — CBC WITH DIFFERENTIAL/PLATELET
BASOS PCT: 1 %
Basophils Absolute: 0.2 10*3/uL — ABNORMAL HIGH (ref 0.0–0.1)
EOS PCT: 2 %
Eosinophils Absolute: 0.3 10*3/uL (ref 0.0–0.7)
HCT: 32.4 % — ABNORMAL LOW (ref 39.0–52.0)
Hemoglobin: 10.4 g/dL — ABNORMAL LOW (ref 13.0–17.0)
LYMPHS ABS: 4.6 10*3/uL — AB (ref 0.7–4.0)
Lymphocytes Relative: 30 %
MCH: 26.6 pg (ref 26.0–34.0)
MCHC: 32.1 g/dL (ref 30.0–36.0)
MCV: 82.9 fL (ref 78.0–100.0)
MONO ABS: 1.4 10*3/uL — AB (ref 0.1–1.0)
Monocytes Relative: 9 %
Neutro Abs: 8.9 10*3/uL — ABNORMAL HIGH (ref 1.7–7.7)
Neutrophils Relative %: 58 %
PLATELETS: 576 10*3/uL — AB (ref 150–400)
RBC: 3.91 MIL/uL — ABNORMAL LOW (ref 4.22–5.81)
RDW: 13.1 % (ref 11.5–15.5)
WBC: 15.4 10*3/uL — ABNORMAL HIGH (ref 4.0–10.5)

## 2017-11-26 LAB — GLUCOSE, CAPILLARY
Glucose-Capillary: 154 mg/dL — ABNORMAL HIGH (ref 70–99)
Glucose-Capillary: 198 mg/dL — ABNORMAL HIGH (ref 70–99)
Glucose-Capillary: 180 mg/dL — ABNORMAL HIGH (ref 70–99)
Glucose-Capillary: 96 mg/dL (ref 70–99)

## 2017-11-26 LAB — CREATININE, SERUM
Creatinine, Ser: 1.14 mg/dL (ref 0.61–1.24)
GFR calc Af Amer: >60 mL/min (ref >60)
GFR calc non Af Amer: >60 mL/min (ref >60)

## 2017-11-26 MED ORDER — CHLORHEXIDINE GLUCONATE 4 % EX LIQD
60.0000 mL | Freq: Once | CUTANEOUS | Status: AC
  Administered 2017-11-26: 4 via TOPICAL
  Filled 2017-11-26: qty 60

## 2017-11-26 NOTE — Progress Notes (Signed)
Subjective: No new complaints  Antibiotics:  Anti-infectives (From admission, onward)   Start     Dose/Rate Route Frequency Ordered Stop   11/25/17 1400  penicillin G potassium 4 Million Units in dextrose 5 % 250 mL IVPB     4 Million Units 250 mL/hr over 60 Minutes Intravenous Every 4 hours 11/25/17 1254     11/25/17 0800  vancomycin (VANCOCIN) IVPB 1000 mg/200 mL premix  Status:  Discontinued     1,000 mg 200 mL/hr over 60 Minutes Intravenous Every 8 hours 11/25/17 0129 11/25/17 1413   11/24/17 0600  ceFAZolin (ANCEF) IVPB 2g/100 mL premix     2 g 200 mL/hr over 30 Minutes Intravenous On call to O.R. 11/23/17 2306 11/25/17 0559   11/23/17 1400  piperacillin-tazobactam (ZOSYN) IVPB 3.375 g  Status:  Discontinued     3.375 g 12.5 mL/hr over 240 Minutes Intravenous Every 8 hours 11/23/17 1031 11/25/17 1254   11/23/17 0000  metroNIDAZOLE (FLAGYL) IVPB 500 mg  Status:  Discontinued     500 mg 100 mL/hr over 60 Minutes Intravenous Every 8 hours 11/22/17 2312 11/23/17 1014   11/22/17 2315  cefTRIAXone (ROCEPHIN) 2 g in sodium chloride 0.9 % 100 mL IVPB  Status:  Discontinued     2 g 200 mL/hr over 30 Minutes Intravenous Every 24 hours 11/22/17 2312 11/23/17 1014   11/22/17 1400  piperacillin-tazobactam (ZOSYN) IVPB 3.375 g  Status:  Discontinued     3.375 g 12.5 mL/hr over 240 Minutes Intravenous Every 8 hours 11/22/17 1043 11/22/17 2311   11/22/17 1045  vancomycin (VANCOCIN) IVPB 750 mg/150 ml premix  Status:  Discontinued     750 mg 150 mL/hr over 60 Minutes Intravenous Every 12 hours 11/22/17 1043 11/25/17 0129   11/21/17 1830  vancomycin (VANCOCIN) IVPB 750 mg/150 ml premix     750 mg 150 mL/hr over 60 Minutes Intravenous  Once 11/21/17 1816 11/21/17 2030   11/21/17 1715  vancomycin (VANCOCIN) IVPB 1000 mg/200 mL premix     1,000 mg 200 mL/hr over 60 Minutes Intravenous  Once 11/21/17 1710 11/21/17 1914   11/21/17 1715  piperacillin-tazobactam (ZOSYN) IVPB 3.375 g       3.375 g 12.5 mL/hr over 240 Minutes Intravenous  Once 11/21/17 1710 11/21/17 2030      Medications: Scheduled Meds: . famotidine  20 mg Oral BID  . gabapentin  400 mg Oral TID  . insulin aspart protamine- aspart  55 Units Subcutaneous BID WC  . levothyroxine  50 mcg Oral QAC breakfast  . lisinopril  10 mg Oral Daily  . povidone-iodine  2 application Topical Once  . sodium chloride flush  3 mL Intravenous Q12H  . traZODone  50 mg Oral QHS   Continuous Infusions: . pencillin G potassium IV 4 Million Units (11/26/17 0841)   PRN Meds:.acetaminophen **OR** acetaminophen, dextrose, hydrALAZINE, ondansetron **OR** ondansetron (ZOFRAN) IV, sodium chloride flush, sodium chloride flush, traMADol    Objective: Weight change:   Intake/Output Summary (Last 24 hours) at 11/26/2017 1413 Last data filed at 11/26/2017 0700 Gross per 24 hour  Intake 1882 ml  Output 2400 ml  Net -518 ml   Blood pressure (!) 146/83, pulse 72, temperature 97.7 F (36.5 C), resp. rate 17, height 5\' 9"  (1.753 m), weight 86.8 kg, SpO2 98 %. Temp:  [97.7 F (36.5 C)-98.7 F (37.1 C)] 97.7 F (36.5 C) (09/10 0459) Pulse Rate:  [72-74] 72 (09/10 0459) Resp:  [13-17] 17 (  09/10 0459) BP: (142-147)/(71-83) 146/83 (09/10 0459) SpO2:  [98 %-100 %] 98 % (09/10 0459)  Physical Exam: General: Alert and awake, oriented x3, not in any acute distress. HEENT: anicteric sclera, EOMI CVS regular rate, normal  Chest: , no wheezing, no respiratory distress Abdomen: soft non-distended,  Extremities: Skin: mx track marks  Left foot 11/25/17:        Right foot 11/26/17:      Neuro: nonfocal  CBC:    BMET Recent Labs    11/24/17 0347  11/25/17 1534 11/26/17 0445  NA 137  --  138  --   K 4.0  --  4.0  --   CL 107  --  106  --   CO2 23  --  23  --   GLUCOSE 246*  --  241*  --   BUN 11  --  8  --   CREATININE 1.16   < > 1.19 1.14  CALCIUM 8.2*  --  8.4*  --    < > = values in this interval not  displayed.     Liver Panel  No results for input(s): PROT, ALBUMIN, AST, ALT, ALKPHOS, BILITOT, BILIDIR, IBILI in the last 72 hours.     Sedimentation Rate No results for input(s): ESRSEDRATE in the last 72 hours. C-Reactive Protein No results for input(s): CRP in the last 72 hours.  Micro Results: Recent Results (from the past 720 hour(s))  Blood Culture (routine x 2)     Status: Abnormal   Collection Time: 11/21/17  1:30 PM  Result Value Ref Range Status   Specimen Description BLOOD RIGHT HAND  Final   Special Requests   Final    BOTTLES DRAWN AEROBIC AND ANAEROBIC Blood Culture results may not be optimal due to an excessive volume of blood received in culture bottles   Culture  Setup Time   Final    GRAM POSITIVE COCCI AEROBIC BOTTLE ONLY CRITICAL RESULT CALLED TO, READ BACK BY AND VERIFIED WITH: PHARMD L SEAY 11/22/17 AT 2226 BY CM GRAM POSITIVE RODS ANAEROBIC BOTTLE ONLY CRITICAL RESULT CALLED TO, READ BACK BY AND VERIFIED WITH: D PEARCE,PHARMD AT 1710 11/23/17 BY L BENFIELD    Culture (A)  Final    VIRIDANS STREPTOCOCCUS THE SIGNIFICANCE OF ISOLATING THIS ORGANISM FROM A SINGLE SET OF BLOOD CULTURES WHEN MULTIPLE SETS ARE DRAWN IS UNCERTAIN. PLEASE NOTIFY THE MICROBIOLOGY DEPARTMENT WITHIN ONE WEEK IF SPECIATION AND SENSITIVITIES ARE REQUIRED. ACTINOMYCES NAESLUNDII Standardized susceptibility testing for this organism is not available. Performed at [facility], [place] [address], [place], [place] [address]    Report Status 11/25/2017 FINAL  Final  Blood Culture ID Panel (Reflexed)     Status: Abnormal   Collection Time: 11/21/17  1:30 PM  Result Value Ref Range Status   Enterococcus species NOT DETECTED NOT DETECTED Final   Listeria monocytogenes NOT DETECTED NOT DETECTED Final   Staphylococcus species NOT DETECTED NOT DETECTED Final   Staphylococcus aureus NOT DETECTED NOT DETECTED Final   Streptococcus species DETECTED (A) NOT DETECTED Final    Comment: Not  Enterococcus species, Streptococcus agalactiae, Streptococcus pyogenes, or Streptococcus pneumoniae. CRITICAL RESULT CALLED TO, READ BACK BY AND VERIFIED WITH: PHARMD L SEAY 11/22/17 AT 2227 BY CM    Streptococcus agalactiae NOT DETECTED NOT DETECTED Final   Streptococcus pneumoniae NOT DETECTED NOT DETECTED Final   Streptococcus pyogenes NOT DETECTED NOT DETECTED Final   Acinetobacter baumannii NOT DETECTED NOT DETECTED Final   Enterobacteriaceae species NOT  DETECTED NOT DETECTED Final   Enterobacter cloacae complex NOT DETECTED NOT DETECTED Final   Escherichia coli NOT DETECTED NOT DETECTED Final   Klebsiella oxytoca NOT DETECTED NOT DETECTED Final   Klebsiella pneumoniae NOT DETECTED NOT DETECTED Final   Proteus species NOT DETECTED NOT DETECTED Final   Serratia marcescens NOT DETECTED NOT DETECTED Final   Haemophilus influenzae NOT DETECTED NOT DETECTED Final   Neisseria meningitidis NOT DETECTED NOT DETECTED Final   Pseudomonas aeruginosa NOT DETECTED NOT DETECTED Final   Candida albicans NOT DETECTED NOT DETECTED Final   Candida glabrata NOT DETECTED NOT DETECTED Final   Candida krusei NOT DETECTED NOT DETECTED Final   Candida parapsilosis NOT DETECTED NOT DETECTED Final   Candida tropicalis NOT DETECTED NOT DETECTED Final    Comment: Performed at [facility], [place] [address], [place], [place] [address]  Blood Culture (routine x 2)     Status: None   Collection Time: 11/21/17  8:10 PM  Result Value Ref Range Status   Specimen Description BLOOD SITE NOT SPECIFIED  Final   Special Requests   Final    BOTTLES DRAWN AEROBIC ONLY Blood Culture results may not be optimal due to an inadequate volume of blood received in culture bottles   Culture   Final    NO GROWTH 5 DAYS Performed at [facility], [place] [address] [place], [place] [address]    Report Status 11/26/2017 FINAL  Final  MRSA PCR Screening     Status: None   Collection Time: 11/21/17  8:19 PM  Result Value  Ref Range Status   MRSA by PCR NEGATIVE NEGATIVE Final    Comment:        The GeneXpert MRSA Assay (FDA approved for NASAL specimens only), is one component of a comprehensive MRSA colonization surveillance program. It is not intended to diagnose MRSA infection nor to guide or monitor treatment for MRSA infections. Performed at [facility], [place] [address], [place], [place] [address]   Aerobic Culture (superficial specimen)     Status: None (Preliminary result)   Collection Time: 11/23/17  1:35 PM  Result Value Ref Range Status   Specimen Description WOUND FOOT LEFT  Final   Special Requests Normal  Final   Gram Stain   Final    MODERATE WBC PRESENT, PREDOMINANTLY PMN MODERATE GRAM POSITIVE RODS FEW GRAM NEGATIVE RODS RARE GRAM POSITIVE COCCI IN PAIRS    Culture   Final    CULTURE REINCUBATED FOR BETTER GROWTH Performed at [facility], [place] [address] [place], [place] [address]    Report Status PENDING  Incomplete  Surgical pcr screen     Status: None   Collection Time: 11/24/17 12:03 AM  Result Value Ref Range Status   MRSA, PCR NEGATIVE NEGATIVE Final   Staphylococcus aureus NEGATIVE NEGATIVE Final    Comment: (NOTE) The Xpert SA Assay (FDA approved for NASAL specimens in patients 48 years of age and older), is one component of a comprehensive surveillance program. It is not intended to diagnose infection nor to guide or monitor treatment. Performed at [facility], [place] [address] [place], [place] [address]     Studies/Results: No results found.    Assessment/Plan:  INTERVAL HISTORY: actinomyces is growing from blood culture and possibly from foot culture he has been seen by Dr. 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 male with IV drug use and foot infection also with bacteremia with actinomyces species, tissue infections and OSTEOMYELITIS OF LEFT MEDIAL MALLEOLUS  Osteomyelitis in the medial malleolus is an absolute disaster.  I am grateful to Dr. Sharol Given is going to taken to the operating room to debride this area.  I have emphasized to the patient that he is at very high risk of losing this limb without proper surgical and medical management.  Also have debridement of his left foot wounds and his right foot infection which is still not drained spontaneously.  I suspect this is all due to actinomyces infection  As mentioned yesterday proper therapy for this would be 6 weeks of IV penicillin followed by protracted oral antibiotics likely with Augmentin for potentially a year.  Patient is still very much not very enthusiastic about staying in the hospital and so I suspect he will still want to go home though I have emphasized to him that the risk of him losing the limb will be may be higher with him not receiving aggressive parenteral antibiotics.  We are also trying to obtain TEE (posisbly while he is in OR)   He tells me he is willing to stop smoking as well and his very emphatic that he will never use IV drugs again.  IVs mellitus is also not well managed 12 and he acquired it in his early 73s and it is IDDM  Hepatitis C seropositivity we will check a hepatitis C RNA.  This can be addressed as an outpatient. (he also denies ever sharing them)     LOS: 5 days   Alcide Evener 11/26/2017, 2:13 PM

## 2017-11-26 NOTE — H&P (View-Only) (Signed)
ORTHOPAEDIC CONSULTATION  REQUESTING PHYSICIAN: Kinnie Feil, MD  Chief Complaint: Abscess in both feet.  HPI: Luke [name] is a 37 y.o. male who presents with 3 separate abscesses left foot and now developed an abscess on the right foot despite IV antibiotics the dorsal aspect on the left foot has decompressed itself.  Patient has uncontrolled type 2 diabetes with hemoglobin A1c greater than 12 and history of tobacco abuse as well as severe protein caloric malnutrition with a albumin of 2.1.  Of note patient had a left upper extremity infection that was treated with multiple surgical debridements as well about 4 to 5 years ago.  Past Medical History:  Diagnosis Date  . Anemia   . Anxiety  Dx May 06, 2006  . Diabetes mellitus without complication ([place]) Dx 2863  . DKA (diabetic ketoacidoses) ([place]) 07/21/2017  . GERD (gastroesophageal reflux disease) Dx 06-May-2006  . Headache(784.0)   . Pneumonia   . Seizures ([place]) 2009/05/06    x 2 in lifetime. on Dilantin for a while.   . Substance abuse ([place]) 07-May-2011    heroin use, multiple relapses   Past Surgical History:  Procedure Laterality Date  . I&D EXTREMITY Left 10/11/2012   Procedure: IRRIGATION AND DEBRIDEMENT ABSCESS FOREARM;  Surgeon: Linna Hoff, MD;  Location: [facility];  Service: Orthopedics;  Laterality: Left;  . I&D EXTREMITY Left 10/12/2012   Procedure: IRRIGATION AND DEBRIDEMENT FOREARM;  Surgeon: Linna Hoff, MD;  Location: [facility];  Service: Orthopedics;  Laterality: Left;  . I&D EXTREMITY Left 10/14/2012   Procedure: incision and drainage left forearm;  Surgeon: Linna Hoff, MD;  Location: [facility];  Service: Orthopedics;  Laterality: Left;  . I&D EXTREMITY Left 10/16/2012   Procedure: IRRIGATION AND DEBRIDEMENT LEFT FOREARM;  Surgeon: Linna Hoff, MD;  Location: [facility];  Service: Orthopedics;  Laterality: Left;  . I&D EXTREMITY Left 10/20/2012   Procedure: INCISION AND DRAINAGE AND DEBRIDEMENT LEFT  FOREARM;  Surgeon: Linna Hoff,  MD;  Location: [name];  Service: Orthopedics;  Laterality: Left;  . IRRIGATION AND DEBRIDEMENT ABSCESS     Hx: of left arm abscess related to drug use    Social History   Socioeconomic History  . Marital status: Married    Spouse name: Not on file  . Number of children: 5  . Years of education: some colle  . Highest education level: Not on file  Occupational History  . Occupation: Event set up    Employer: [employer]   . Occupation: Stencil for cars   Social Needs  . Financial resource strain: Not on file  . Food insecurity:    Worry: Not on file    Inability: Not on file  . Transportation needs:    Medical: Not on file    Non-medical: Not on file  Tobacco Use  . Smoking status: Current Every Day Smoker    Packs/day: 1.00    Types: Cigarettes  . Smokeless tobacco: Never Used  Substance and Sexual Activity  . Alcohol use: No    Alcohol/week: 0.0 standard drinks    Comment: occasional  . Drug use: Not Currently    Types: Cocaine, IV, Heroin    Comment: IVDU since Dad died in May 06, 2006 with 15 month period of sobriety 2018-2019, relapse Aug 2019.  Marland Kitchen Sexual activity: Yes    Partners: Female    Birth control/protection: None  Lifestyle  . Physical activity:    Days  per week: Not on file    Minutes per session: Not on file  . Stress: Not on file  Relationships  . Social connections:    Talks on phone: Not on file    Gets together: Not on file    Attends religious service: Not on file    Active member of club or organization: Not on file    Attends meetings of clubs or organizations: Not on file    Relationship status: Not on file  Other Topics Concern  . Not on file  Social History Narrative   Lives with wife and two children age 59 and 41.          Family History  Problem Relation Age of Onset  . Diabetes Father   . Heart disease Father   . Mental illness Sister   . Cancer Neg Hx    - negative except otherwise stated in the family  history section No Known Allergies Prior to Admission medications   Medication Sig Start Date End Date Taking? Authorizing Provider  acetaminophen (TYLENOL) 500 MG tablet Take 2,000 mg by mouth every 6 (six) hours as needed for mild pain.   Yes [provider]  gabapentin (NEURONTIN) 400 MG capsule Take 1 capsule (400 mg total) by mouth 3 (three) times daily. 09/30/17 12/29/17 Yes Gildardo Pounds, NP  insulin aspart (NOVOLOG) 100 UNIT/ML injection Inject 0-15 Units into the skin 3 (three) times daily with meals. 07/25/17  Yes Georgette Shell, MD  insulin aspart protamine- aspart (NOVOLOG MIX 70/30) (70-30) 100 UNIT/ML injection Inject 0.3 mLs (30 Units total) into the skin 2 (two) times daily with a meal. Patient taking differently: Inject 30-40 Units into the skin See admin instructions.  07/25/17  Yes Georgette Shell, MD  levothyroxine (SYNTHROID, LEVOTHROID) 50 MCG tablet Take 1 tablet (50 mcg total) by mouth daily. 10/31/17  Yes Gildardo Pounds, NP  lisinopril (PRINIVIL,ZESTRIL) 10 MG tablet Take 1 tablet (10 mg total) by mouth daily. 09/30/17  Yes Gildardo Pounds, NP  ranitidine (ZANTAC) 150 MG tablet Take 150 mg by mouth 2 (two) times daily.   Yes [provider]  traZODone (DESYREL) 50 MG tablet Take 1 tablet (50 mg total) by mouth at bedtime. 11/20/17  Yes Gildardo Pounds, NP  acetaminophen (TYLENOL) 325 MG tablet Take 2 tablets (650 mg total) by mouth every 6 (six) hours as needed for mild pain (or Fever >/= 101). Patient not taking: Reported on 11/21/2017 12/19/16   Debbe Odea, MD  Blood Glucose Monitoring Suppl (TRUE METRIX METER) DEVI 1 kit by Does not apply route as directed. Use as directed 06/07/15   Boykin Nearing, MD  glucose blood test strip Use as instructed 09/30/17   Gildardo Pounds, NP  Insulin Syringe-Needle U-100 (TRUEPLUS INSULIN SYRINGE) 31G X 5/16" 0.5 ML MISC USE AS DIRECTED. 01/28/17   Alfonse Spruce, FNP  TRUEPLUS LANCETS 28G MISC Use as  directed 01/28/17   Alfonse Spruce, FNP   No results found. - pertinent xrays, CT, MRI studies were reviewed and independently interpreted  Positive ROS: All other systems have been reviewed and were otherwise negative with the exception of those mentioned in the HPI and as above.  Physical Exam: General: Alert, no acute distress Psychiatric: Patient is competent for consent with normal mood and affect Lymphatic: No axillary or cervical lymphadenopathy Cardiovascular: No pedal edema Respiratory: No cyanosis, no use of accessory musculature GI: No organomegaly, abdomen is soft and non-tender  Images:  _0 @  Labs:  Lab Results  Component Value Date   HGBA1C 12.1 (H) 11/22/2017   HGBA1C 10.4 (A) 10/21/2017   HGBA1C 13.1 05/15/2017   ESRSEDRATE 40 (H) 12/03/2014   CRP 9.0 (H) 11/23/2017   CRP 12.2 (H) 11/22/2017   CRP 8.7 (H) 12/03/2014   REPTSTATUS PENDING 11/23/2017   GRAMSTAIN  11/23/2017    MODERATE WBC PRESENT, PREDOMINANTLY PMN MODERATE GRAM POSITIVE RODS FEW GRAM NEGATIVE RODS RARE GRAM POSITIVE COCCI IN PAIRS    CULT  11/23/2017    CULTURE REINCUBATED FOR BETTER GROWTH Performed at [facility], [place] [address], [place], [place] [address]    LABORGA METHICILLIN RESISTANT STAPHYLOCOCCUS AUREUS (A) 12/16/2016    Lab Results  Component Value Date   ALBUMIN 2.1 (L) 11/23/2017   ALBUMIN 2.2 (L) 11/22/2017   ALBUMIN 2.9 (L) 11/21/2017    Neurologic: Patient does not have protective sensation bilateral lower extremities.   MUSCULOSKELETAL:   Skin: Examination patient has a abscess over the dorsum of the right foot which is not open.  Patient has a good dorsalis pedis pulse bilaterally.  Patient has a necrotic eschar over the dorsum of the left foot approximately 2 cm in diameter there is a black eschar over the medial malleolus as well as purulent drainage from the dorsal and medial aspect of the first metatarsal left foot.  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  Hemoglobin A1c 12.1, albumin 2.1, cultures previously positive for MRSA in 2018.  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  Will plan for surgery tomorrow Wednesday for debridement of the abscesses on both feet as well as debridement of the medial malleolus of the left ankle.  With patient's MRI findings consistent with osteomyelitis of the medial malleolus patient most likely will need 6 weeks of IV antibiotics.  With patient's past medical history, discharge to home with a PICC line may not be ideal.  Patient may require discharge to skilled nursing.  Thank you for the consult and the opportunity to see Luke [name], [place] [phone] 6:58 AM

## 2017-11-26 NOTE — Consult Note (Signed)
  ORTHOPAEDIC CONSULTATION  REQUESTING PHYSICIAN: Buriev, Ulugbek N, MD  Chief Complaint: Abscess in both feet.  HPI: Luke [name] is a 37 y.o. male who presents with 3 separate abscesses left foot and now developed an abscess on the right foot despite IV antibiotics the dorsal aspect on the left foot has decompressed itself.  Patient has uncontrolled type 2 diabetes with hemoglobin A1c greater than 12 and history of tobacco abuse as well as severe protein caloric malnutrition with a albumin of 2.1.  Of note patient had a left upper extremity infection that was treated with multiple surgical debridements as well about 4 to 5 years ago.  Past Medical History:  Diagnosis Date  . Anemia   . Anxiety  Dx 2008  . Diabetes mellitus without complication ([name]) Dx 2008  . DKA (diabetic ketoacidoses) ([name]) 07/21/2017  . GERD (gastroesophageal reflux disease) Dx 2008  . Headache(784.0)   . Pneumonia   . Seizures ([name]) 2011    x 2 in lifetime. on Dilantin for a while.   . 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 I&D EXTREMITY Left 10/11/2012   Procedure: IRRIGATION AND DEBRIDEMENT ABSCESS FOREARM;  Surgeon: Fred W Ortmann, MD;  Location: [facility];  Service: Orthopedics;  Laterality: Left;  . I&D EXTREMITY Left 10/12/2012   Procedure: IRRIGATION AND DEBRIDEMENT FOREARM;  Surgeon: Fred W Ortmann, MD;  Location: [facility] OR;  Service: Orthopedics;  Laterality: Left;  . I&D EXTREMITY Left 10/14/2012   Procedure: incision and drainage left forearm;  Surgeon: Fred W Ortmann, MD;  Location: [facility];  Service: Orthopedics;  Laterality: Left;  . I&D EXTREMITY Left 10/16/2012   Procedure: IRRIGATION AND DEBRIDEMENT LEFT FOREARM;  Surgeon: Fred W Ortmann, MD;  Location: [facility];  Service: Orthopedics;  Laterality: Left;  . I&D EXTREMITY Left 10/20/2012   Procedure: INCISION AND DRAINAGE AND DEBRIDEMENT LEFT  FOREARM;  Surgeon: Fred W Ortmann,  MD;  Location: [facility];  Service: Orthopedics;  Laterality: Left;  . IRRIGATION AND DEBRIDEMENT ABSCESS     Hx: of left arm abscess related to drug use    Social History   Socioeconomic History  . Marital status: Married    Spouse name: Not on file  . Number of children: 5  . Years of education: some colle  . Highest education level: Not on file  Occupational History  . Occupation: Event set up    Employer: [employer]    Comment: [employer]   . Occupation: Stencil for cars   Social Needs  . Financial resource strain: Not on file  . Food insecurity:    Worry: Not on file    Inability: Not on file  . Transportation needs:    Medical: Not on file    Non-medical: Not on file  Tobacco Use  . Smoking status: Current Every Day Smoker    Packs/day: 1.00    Types: Cigarettes  . Smokeless tobacco: Never Used  Substance and Sexual Activity  . Alcohol use: No    Alcohol/week: 0.0 standard drinks    Comment: occasional  . Drug use: Not Currently    Types: Cocaine, IV, Heroin    Comment: IVDU since Dad died in 2008 with 15 month period of sobriety 2018-2019, relapse Aug 2019.  . Sexual activity: Yes    Partners: Female    Birth control/protection: None  Lifestyle  . Physical activity:    Days   per week: Not on file    Minutes per session: Not on file  . Stress: Not on file  Relationships  . Social connections:    Talks on phone: Not on file    Gets together: Not on file    Attends religious service: Not on file    Active member of club or organization: Not on file    Attends meetings of clubs or organizations: Not on file    Relationship status: Not on file  Other Topics Concern  . Not on file  Social History Narrative   Lives with wife and two children age 59 and 41.          Family History  Problem Relation Age of Onset  . Diabetes Father   . Heart disease Father   . Mental illness Sister   . Cancer Neg Hx    - negative except otherwise stated in the family  history section No Known Allergies Prior to Admission medications   Medication Sig Start Date End Date Taking? Authorizing Provider  acetaminophen (TYLENOL) 500 MG tablet Take 2,000 mg by mouth every 6 (six) hours as needed for mild pain.   Yes [provider]  gabapentin (NEURONTIN) 400 MG capsule Take 1 capsule (400 mg total) by mouth 3 (three) times daily. 09/30/17 12/29/17 Yes Gildardo Pounds, NP  insulin aspart (NOVOLOG) 100 UNIT/ML injection Inject 0-15 Units into the skin 3 (three) times daily with meals. 07/25/17  Yes Georgette Shell, MD  insulin aspart protamine- aspart (NOVOLOG MIX 70/30) (70-30) 100 UNIT/ML injection Inject 0.3 mLs (30 Units total) into the skin 2 (two) times daily with a meal. Patient taking differently: Inject 30-40 Units into the skin See admin instructions.  07/25/17  Yes Georgette Shell, MD  levothyroxine (SYNTHROID, LEVOTHROID) 50 MCG tablet Take 1 tablet (50 mcg total) by mouth daily. 10/31/17  Yes Gildardo Pounds, NP  lisinopril (PRINIVIL,ZESTRIL) 10 MG tablet Take 1 tablet (10 mg total) by mouth daily. 09/30/17  Yes Gildardo Pounds, NP  ranitidine (ZANTAC) 150 MG tablet Take 150 mg by mouth 2 (two) times daily.   Yes [provider]  traZODone (DESYREL) 50 MG tablet Take 1 tablet (50 mg total) by mouth at bedtime. 11/20/17  Yes Gildardo Pounds, NP  acetaminophen (TYLENOL) 325 MG tablet Take 2 tablets (650 mg total) by mouth every 6 (six) hours as needed for mild pain (or Fever >/= 101). Patient not taking: Reported on 11/21/2017 12/19/16   Debbe Odea, MD  Blood Glucose Monitoring Suppl (TRUE METRIX METER) DEVI 1 kit by Does not apply route as directed. Use as directed 06/07/15   Boykin Nearing, MD  glucose blood test strip Use as instructed 09/30/17   Gildardo Pounds, NP  Insulin Syringe-Needle U-100 (TRUEPLUS INSULIN SYRINGE) 31G X 5/16" 0.5 ML MISC USE AS DIRECTED. 01/28/17   Alfonse Spruce, FNP  TRUEPLUS LANCETS 28G MISC Use as  directed 01/28/17   Alfonse Spruce, FNP   No results found. - pertinent xrays, CT, MRI studies were reviewed and independently interpreted  Positive ROS: All other systems have been reviewed and were otherwise negative with the exception of those mentioned in the HPI and as above.  Physical Exam: General: Alert, no acute distress Psychiatric: Patient is competent for consent with normal mood and affect Lymphatic: No axillary or cervical lymphadenopathy Cardiovascular: No pedal edema Respiratory: No cyanosis, no use of accessory musculature GI: No organomegaly, abdomen is soft and non-tender  Images:  _0 @  Labs:  Lab Results  Component Value Date   HGBA1C 12.1 (H) 11/22/2017   HGBA1C 10.4 (A) 10/21/2017   HGBA1C 13.1 05/15/2017   ESRSEDRATE 40 (H) 12/03/2014   CRP 9.0 (H) 11/23/2017   CRP 12.2 (H) 11/22/2017   CRP 8.7 (H) 12/03/2014   REPTSTATUS PENDING 11/23/2017   GRAMSTAIN  11/23/2017    MODERATE WBC PRESENT, PREDOMINANTLY PMN MODERATE GRAM POSITIVE RODS FEW GRAM NEGATIVE RODS RARE GRAM POSITIVE COCCI IN PAIRS    CULT  11/23/2017    CULTURE REINCUBATED FOR BETTER GROWTH Performed at [facility], [place] [address], [place], [place] [address]    LABORGA METHICILLIN RESISTANT STAPHYLOCOCCUS AUREUS (A) 12/16/2016    Lab Results  Component Value Date   ALBUMIN 2.1 (L) 11/23/2017   ALBUMIN 2.2 (L) 11/22/2017   ALBUMIN 2.9 (L) 11/21/2017    Neurologic: Patient does not have protective sensation bilateral lower extremities.   MUSCULOSKELETAL:   Skin: Examination patient has a abscess over the dorsum of the right foot which is not open.  Patient has a good dorsalis pedis pulse bilaterally.  Patient has a necrotic eschar over the dorsum of the left foot approximately 2 cm in diameter there is a black eschar over the medial malleolus as well as purulent drainage from the dorsal and medial aspect of the first metatarsal left foot.  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  Hemoglobin A1c 12.1, albumin 2.1, cultures previously positive for MRSA in 2018.  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  Will plan for surgery tomorrow Wednesday for debridement of the abscesses on both feet as well as debridement of the medial malleolus of the left ankle.  With patient's MRI findings consistent with osteomyelitis of the medial malleolus patient most likely will need 6 weeks of IV antibiotics.  With patient's past medical history, discharge to home with a PICC line may not be ideal.  Patient may require discharge to skilled nursing.  Thank you for the consult and the opportunity to see Mr. Briggs Edelen, [place] [phone] 6:58 AM

## 2017-11-26 NOTE — Progress Notes (Signed)
[facility] PROGRESS NOTE  Luke [name] ERD:[id number] DOB: 1980-07-29 DOA: 11/21/2017 PCP: Gildardo Pounds, NP  Brief summary   37 year old male with past medical history of IV drug abuse with recent relapse, insulin-dependent diabetes, hypertension who presented to the emergency department with complaints of worsening wound on the left foot.  Patient was injecting cocaine/heroine on the left foot.  He was admitted  For possible  osteomyelitis of the left foot, sepsis,DKA.  Orthopedics following.  ID consulted for antibiotics management.Blood culture now showing actinomyces.   Assessment/Plan:  Sepsis/possible osteomyelitis of the left foot:Cellulitis and abscess of both feet: At sites of injection of IV drugs.  H/O debridement on left arm for similar presentation in 2014. MRI left foot showed edema and enhancement the medial malleolus consistent with osteomyelitis.  -Ortho is planning ID tomorrow.  cont iv antibiotics   Bacteremia: Blood culture was showing Streptococcus viridans which was thought to be contaminant but one of the cultures  has also shown ACTINOMYCES  NAESLUNDII . - ID following. Antibiotics changed to penicillin G.  TTE did not show endocarditis, d/w cards: being scheduled for TEE.   Poorly controlled IDDM with DKA: required  iv insulin drip which has been stopped.  Currently on insulin 70/30.  Dose increased to 55 units twice daily.  Diabetic coordinator following.Hemoglobin A1c of 12.1.  Continue to monitor blood sugars.  Polysubstance IV drug use: Relapsed from 15 months sobriety about 3 weeks ago. Injects cocaine/heroin. Cessation counseling performed extensively at admission. Pt ready to quit. CSW consulted. Plan is to move away from [place]. UDS positive for cocaine and marijuana HIV negative.  Hepatitis panel showed HCV.  check viral load PCR with genetics  HTN: monitor on lisinopril. Prn hydralazine  Elevated TSH: 6.831, marginal elevation, but  started on synthroid by PCP.Continue synthroid 30mcg.  GERD: Continue PPI  Diabetic polyneuropathy: Continue gabapentin  Insomnia: Continue trazodone   Code Status: full Family Communication: d/w patient, RN (indicate person spoken with, relationship, and if by phone, the number) Disposition Plan: needs iv antibiotics for 6 weeks    Consultants:  ID  Ortho  Procedures:  TTE  Antibiotics: Anti-infectives (From admission, onward)   Start     Dose/Rate Route Frequency Ordered Stop   11/25/17 1400  penicillin G potassium 4 Million Units in dextrose 5 % 250 mL IVPB     4 Million Units 250 mL/hr over 60 Minutes Intravenous Every 4 hours 11/25/17 1254     11/25/17 0800  vancomycin (VANCOCIN) IVPB 1000 mg/200 mL premix  Status:  Discontinued     1,000 mg 200 mL/hr over 60 Minutes Intravenous Every 8 hours 11/25/17 0129 11/25/17 1413   11/24/17 0600  ceFAZolin (ANCEF) IVPB 2g/100 mL premix     2 g 200 mL/hr over 30 Minutes Intravenous On call to O.R. 11/23/17 2306 11/25/17 0559   11/23/17 1400  piperacillin-tazobactam (ZOSYN) IVPB 3.375 g  Status:  Discontinued     3.375 g 12.5 mL/hr over 240 Minutes Intravenous Every 8 hours 11/23/17 1031 11/25/17 1254   11/23/17 0000  metroNIDAZOLE (FLAGYL) IVPB 500 mg  Status:  Discontinued     500 mg 100 mL/hr over 60 Minutes Intravenous Every 8 hours 11/22/17 2312 11/23/17 1014   11/22/17 2315  cefTRIAXone (ROCEPHIN) 2 g in sodium chloride 0.9 % 100 mL IVPB  Status:  Discontinued     2 g 200 mL/hr over 30 Minutes Intravenous Every 24 hours 11/22/17 2312 11/23/17 1014   11/22/17 1400  piperacillin-tazobactam (ZOSYN) IVPB 3.375 g  Status:  Discontinued     3.375 g 12.5 mL/hr over 240 Minutes Intravenous Every 8 hours 11/22/17 1043 11/22/17 2311   11/22/17 1045  vancomycin (VANCOCIN) IVPB 750 mg/150 ml premix  Status:  Discontinued     750 mg 150 mL/hr over 60 Minutes Intravenous Every 12 hours 11/22/17 1043 11/25/17 0129   11/21/17  1830  vancomycin (VANCOCIN) IVPB 750 mg/150 ml premix     750 mg 150 mL/hr over 60 Minutes Intravenous  Once 11/21/17 1816 11/21/17 2030   11/21/17 1715  vancomycin (VANCOCIN) IVPB 1000 mg/200 mL premix     1,000 mg 200 mL/hr over 60 Minutes Intravenous  Once 11/21/17 1710 11/21/17 1914   11/21/17 1715  piperacillin-tazobactam (ZOSYN) IVPB 3.375 g     3.375 g 12.5 mL/hr over 240 Minutes Intravenous  Once 11/21/17 1710 11/21/17 2030        (indicate start date, and stop date if known)  HPI/Subjective: Reports feeling well. No acute distress. Being scheduled for ID tomorrow.   Objective: Vitals:   11/25/17 2202 11/26/17 0459  BP: (!) 147/71 (!) 146/83  Pulse: 74 72  Resp: 17 17  Temp: 98.7 F (37.1 C) 97.7 F (36.5 C)  SpO2: 100% 98%    Intake/Output Summary (Last 24 hours) at 11/26/2017 2202 Last data filed at 11/26/2017 0700 Gross per 24 hour  Intake 2825.1 ml  Output 3100 ml  Net -274.9 ml   Filed Weights   11/23/17 0227  Weight: 86.8 kg    Exam:   General:  No distress   Cardiovascular: s1,s2 rrr  Respiratory: CTA BL  Abdomen: soft, nt   Musculoskeletal: no pedal edema. Leg ulcers   Data Reviewed: Basic Metabolic Panel: Recent Labs  Lab 11/21/17 1332 11/22/17 1750 11/23/17 0410 11/24/17 0347 11/25/17 0319 11/25/17 1534 11/26/17 0445  NA 131* 138 135 137  --  138  --   K 4.5 3.6 4.5 4.0  --  4.0  --   CL 95* 107 107 107  --  106  --   CO2 19* 23 20* 23  --  23  --   GLUCOSE 682* 169* 400* 246*  --  241*  --   BUN 19 11 10 11   --  8  --   CREATININE 1.95* 1.16 1.31* 1.16 1.15 1.19 1.14  CALCIUM 8.8* 8.2* 8.2* 8.2*  --  8.4*  --    Liver Function Tests: Recent Labs  Lab 11/21/17 1332 11/22/17 1750 11/23/17 0410  AST 12* 15 16  ALT 14 11 11   ALKPHOS 128* 94 88  BILITOT 1.2 0.7 0.4  PROT 6.9 5.5* 5.5*  ALBUMIN 2.9* 2.2* 2.1*   No results for input(s): LIPASE, AMYLASE in the last 168 hours. No results for input(s): AMMONIA in the last  168 hours. CBC: Recent Labs  Lab 11/21/17 1332 11/22/17 1750 11/23/17 0410 11/24/17 0347 11/25/17 0319 11/26/17 0445  WBC 24.1* 19.5* 16.9* 16.8* 15.8* 15.4*  NEUTROABS 18.0*  --   --   --   --  8.9*  HGB 11.9* 9.3* 9.4* 9.4* 9.6* 10.4*  HCT 36.1* 28.9* 28.4* 29.2* 29.4* 32.4*  MCV 83.8 83.5 82.1 83.0 83.1 82.9  PLT 560* 453* 426* 474* 504* 576*   Cardiac Enzymes: No results for input(s): CKTOTAL, CKMB, CKMBINDEX, TROPONINI in the last 168 hours. BNP (last 3 results) No results for input(s): BNP in the last 8760 hours.  ProBNP (last 3 results) No results for  input(s): PROBNP in the last 8760 hours.  CBG: Recent Labs  Lab 11/24/17 2204 11/25/17 0806 11/25/17 1220 11/25/17 2201 11/26/17 0814  GLUCAP 80 246* 192* 127* 154*    Recent Results (from the past 240 hour(s))  Blood Culture (routine x 2)     Status: Abnormal   Collection Time: 11/21/17  1:30 PM  Result Value Ref Range Status   Specimen Description BLOOD RIGHT HAND  Final   Special Requests   Final    BOTTLES DRAWN AEROBIC AND ANAEROBIC Blood Culture results may not be optimal due to an excessive volume of blood received in culture bottles   Culture  Setup Time   Final    GRAM POSITIVE COCCI AEROBIC BOTTLE ONLY CRITICAL RESULT CALLED TO, READ BACK BY AND VERIFIED WITH: PHARMD L SEAY 11/22/17 AT 2226 BY CM GRAM POSITIVE RODS ANAEROBIC BOTTLE ONLY CRITICAL RESULT CALLED TO, READ BACK BY AND VERIFIED WITH: D PEARCE,PHARMD AT 1710 11/23/17 BY L BENFIELD    Culture (A)  Final    VIRIDANS STREPTOCOCCUS THE SIGNIFICANCE OF ISOLATING THIS ORGANISM FROM A SINGLE SET OF BLOOD CULTURES WHEN MULTIPLE SETS ARE DRAWN IS UNCERTAIN. PLEASE NOTIFY THE MICROBIOLOGY DEPARTMENT WITHIN ONE WEEK IF SPECIATION AND SENSITIVITIES ARE REQUIRED. ACTINOMYCES NAESLUNDII Standardized susceptibility testing for this organism is not available. Performed at [facility], [place] [address], [place], [place] [address]    Report Status  11/25/2017 FINAL  Final  Blood Culture ID Panel (Reflexed)     Status: Abnormal   Collection Time: 11/21/17  1:30 PM  Result Value Ref Range Status   Enterococcus species NOT DETECTED NOT DETECTED Final   Listeria monocytogenes NOT DETECTED NOT DETECTED Final   Staphylococcus species NOT DETECTED NOT DETECTED Final   Staphylococcus aureus NOT DETECTED NOT DETECTED Final   Streptococcus species DETECTED (A) NOT DETECTED Final    Comment: Not Enterococcus species, Streptococcus agalactiae, Streptococcus pyogenes, or Streptococcus pneumoniae. CRITICAL RESULT CALLED TO, READ BACK BY AND VERIFIED WITH: PHARMD L SEAY 11/22/17 AT 2227 BY CM    Streptococcus agalactiae NOT DETECTED NOT DETECTED Final   Streptococcus pneumoniae NOT DETECTED NOT DETECTED Final   Streptococcus pyogenes NOT DETECTED NOT DETECTED Final   Acinetobacter baumannii NOT DETECTED NOT DETECTED Final   Enterobacteriaceae species NOT DETECTED NOT DETECTED Final   Enterobacter cloacae complex NOT DETECTED NOT DETECTED Final   Escherichia coli NOT DETECTED NOT DETECTED Final   Klebsiella oxytoca NOT DETECTED NOT DETECTED Final   Klebsiella pneumoniae NOT DETECTED NOT DETECTED Final   Proteus species NOT DETECTED NOT DETECTED Final   Serratia marcescens NOT DETECTED NOT DETECTED Final   Haemophilus influenzae NOT DETECTED NOT DETECTED Final   Neisseria meningitidis NOT DETECTED NOT DETECTED Final   Pseudomonas aeruginosa NOT DETECTED NOT DETECTED Final   Candida albicans NOT DETECTED NOT DETECTED Final   Candida glabrata NOT DETECTED NOT DETECTED Final   Candida krusei NOT DETECTED NOT DETECTED Final   Candida parapsilosis NOT DETECTED NOT DETECTED Final   Candida tropicalis NOT DETECTED NOT DETECTED Final    Comment: Performed at [facility], [place] [address] [place], [place] [address]  Blood Culture (routine x 2)     Status: None   Collection Time: 11/21/17  8:10 PM  Result Value Ref Range Status   Specimen  Description BLOOD SITE NOT SPECIFIED  Final   Special Requests   Final    BOTTLES DRAWN AEROBIC ONLY Blood Culture results may not be optimal due  to an inadequate volume of blood received in culture bottles   Culture   Final    NO GROWTH 5 DAYS Performed at [facility], [place] [address] [place], [place] [address]    Report Status 11/26/2017 FINAL  Final  MRSA PCR Screening     Status: None   Collection Time: 11/21/17  8:19 PM  Result Value Ref Range Status   MRSA by PCR NEGATIVE NEGATIVE Final    Comment:        The GeneXpert MRSA Assay (FDA approved for NASAL specimens only), is one component of a comprehensive MRSA colonization surveillance program. It is not intended to diagnose MRSA infection nor to guide or monitor treatment for MRSA infections. Performed at [facility], [place] [address] [place], [place] [address]   Aerobic Culture (superficial specimen)     Status: None (Preliminary result)   Collection Time: 11/23/17  1:35 PM  Result Value Ref Range Status   Specimen Description WOUND FOOT LEFT  Final   Special Requests Normal  Final   Gram Stain   Final    MODERATE WBC PRESENT, PREDOMINANTLY PMN MODERATE GRAM POSITIVE RODS FEW GRAM NEGATIVE RODS RARE GRAM POSITIVE COCCI IN PAIRS    Culture   Final    CULTURE REINCUBATED FOR BETTER GROWTH Performed at [facility], [place] [address], [place], [place] [address]    Report Status PENDING  Incomplete  Surgical pcr screen     Status: None   Collection Time: 11/24/17 12:03 AM  Result Value Ref Range Status   MRSA, PCR NEGATIVE NEGATIVE Final   Staphylococcus aureus NEGATIVE NEGATIVE Final    Comment: (NOTE) The Xpert SA Assay (FDA approved for NASAL specimens in patients 68 years of age and older), is one component of a comprehensive surveillance program. It is not intended to diagnose infection nor to guide or monitor treatment. Performed at [facility], [place] [address], [place],  [place] [address]      Studies: No results found.  Scheduled Meds: . famotidine  20 mg Oral BID  . gabapentin  400 mg Oral TID  . insulin aspart protamine- aspart  55 Units Subcutaneous BID WC  . levothyroxine  50 mcg Oral QAC breakfast  . lisinopril  10 mg Oral Daily  . povidone-iodine  2 application Topical Once  . sodium chloride flush  3 mL Intravenous Q12H  . traZODone  50 mg Oral QHS   Continuous Infusions: . pencillin G potassium IV 4 Million Units (11/26/17 1694)    Principal Problem:   Sepsis due to cellulitis [facility]) Active Problems:   Drug abuse, IV ([place])   Tobacco abuse   DM type 2, uncontrolled, with renal complications ([name])   Essential hypertension   Abscess   DKA (diabetic ketoacidoses) ([name])   Diabetic polyneuropathy associated with type 2 diabetes mellitus ([place])   GERD (gastroesophageal reflux disease)   Bacteremia   Hepatitis C   Actinomyces infection   Cellulitis of left foot    Time spent: >35 minutes     Kinnie Feil  [facility] Pager [phone]. If 7PM-7AM, please contact night-coverage at [email or web address], password [facility] 11/26/2017, 9:22 AM  LOS: 5 days

## 2017-11-27 ENCOUNTER — Encounter (HOSPITAL_COMMUNITY): Payer: Self-pay | Admitting: Certified Registered"

## 2017-11-27 ENCOUNTER — Encounter (HOSPITAL_COMMUNITY)
Admission: EM | Disposition: A | Discharge status: Home / Self Care | Payer: Self-pay | Source: Home / Self Care | Attending: Internal Medicine

## 2017-11-27 DIAGNOSIS — B954 Other streptococcus as the cause of diseases classified elsewhere: Secondary | ICD-10-CM

## 2017-11-27 DIAGNOSIS — F1910 Other psychoactive substance abuse, uncomplicated: Secondary | ICD-10-CM

## 2017-11-27 DIAGNOSIS — E118 Type 2 diabetes mellitus with unspecified complications: Secondary | ICD-10-CM

## 2017-11-27 LAB — GLUCOSE, CAPILLARY
GLUCOSE-CAPILLARY: 368 mg/dL — AB (ref 70–99)
GLUCOSE-CAPILLARY: 389 mg/dL — AB (ref 70–99)
GLUCOSE-CAPILLARY: 333 mg/dL — AB (ref 70–99)
Glucose-Capillary: 289 mg/dL — ABNORMAL HIGH (ref 70–99)
Glucose-Capillary: 371 mg/dL — ABNORMAL HIGH (ref 70–99)
Glucose-Capillary: 373 mg/dL — ABNORMAL HIGH (ref 70–99)

## 2017-11-27 LAB — AEROBIC CULTURE W GRAM STAIN (SUPERFICIAL SPECIMEN): Special Requests: NORMAL

## 2017-11-27 LAB — CREATININE, SERUM
CREATININE: 1.24 mg/dL (ref 0.61–1.24)
GFR calc Af Amer: >60 mL/min (ref >60)

## 2017-11-27 LAB — AEROBIC CULTURE  (SUPERFICIAL SPECIMEN)

## 2017-11-27 LAB — HEPATITIS B SURFACE ANTIBODY, QUANTITATIVE

## 2017-11-27 SURGERY — IRRIGATION AND DEBRIDEMENT EXTREMITY
Anesthesia: General | Laterality: Bilateral

## 2017-11-27 MED ORDER — INSULIN ASPART 100 UNIT/ML ~~LOC~~ SOLN
15.0000 [IU] | Freq: Once | SUBCUTANEOUS | Status: AC
  Administered 2017-11-27: 15 [IU] via SUBCUTANEOUS

## 2017-11-27 MED ORDER — PROPOFOL 10 MG/ML IV BOLUS
INTRAVENOUS | Status: AC
  Filled 2017-11-27: qty 20

## 2017-11-27 MED ORDER — CEFAZOLIN SODIUM-DEXTROSE 2-4 GM/100ML-% IV SOLN
2.0000 g | INTRAVENOUS | Status: DC
  Filled 2017-11-27 (×2): qty 100

## 2017-11-27 MED ORDER — FENTANYL CITRATE (PF) 250 MCG/5ML IJ SOLN
INTRAMUSCULAR | Status: AC
  Filled 2017-11-27: qty 5

## 2017-11-27 MED ORDER — MIDAZOLAM HCL 2 MG/2ML IJ SOLN
INTRAMUSCULAR | Status: AC
  Filled 2017-11-27: qty 2

## 2017-11-27 MED ORDER — HEPATITIS B VAC RECOMBINANT 10 MCG/ML IJ SUSP
1.0000 mL | Freq: Once | INTRAMUSCULAR | Status: DC
  Filled 2017-11-27: qty 1

## 2017-11-27 MED ORDER — LACTATED RINGERS IV SOLN: INTRAVENOUS | Status: DC

## 2017-11-27 MED ORDER — INSULIN ASPART 100 UNIT/ML ~~LOC~~ SOLN
5.0000 [IU] | Freq: Once | SUBCUTANEOUS | Status: AC
  Administered 2017-11-27: 5 [IU] via SUBCUTANEOUS

## 2017-11-27 NOTE — Progress Notes (Signed)
    [facility] has been requested to perform a transesophageal echocardiogram on Mr. Schank for bacteremia with actinomyces species.  After careful review of history and examination, the risks and benefits of transesophageal echocardiogram have been explained including risks of esophageal damage, perforation (1:10,000 risk), bleeding, pharyngeal hematoma as well as other potential complications associated with conscious sedation including aspiration, arrhythmia, respiratory failure and death. Alternatives to treatment were discussed, questions were answered. Patient is willing to proceed.  TEE - Dr. Sallyanne Kuster @ 815am . NPO after midnight. Meds with sips.   Leanor Kail, PA-C 11/27/2017 6:57 PM

## 2017-11-27 NOTE — Progress Notes (Signed)
Subjective: No new complaints  Antibiotics:  Anti-infectives (From admission, onward)   Start     Dose/Rate Route Frequency Ordered Stop   11/27/17 0730  ceFAZolin (ANCEF) IVPB 2g/100 mL premix  Status:  Discontinued     2 g 200 mL/hr over 30 Minutes Intravenous To Short Stay 11/27/17 0716 11/27/17 1317   11/25/17 1400  penicillin G potassium 4 Million Units in dextrose 5 % 250 mL IVPB     4 Million Units 250 mL/hr over 60 Minutes Intravenous Every 4 hours 11/25/17 1254     11/25/17 0800  vancomycin (VANCOCIN) IVPB 1000 mg/200 mL premix  Status:  Discontinued     1,000 mg 200 mL/hr over 60 Minutes Intravenous Every 8 hours 11/25/17 0129 11/25/17 1413   11/24/17 0600  ceFAZolin (ANCEF) IVPB 2g/100 mL premix     2 g 200 mL/hr over 30 Minutes Intravenous On call to O.R. 11/23/17 2306 11/25/17 0559   11/23/17 1400  piperacillin-tazobactam (ZOSYN) IVPB 3.375 g  Status:  Discontinued     3.375 g 12.5 mL/hr over 240 Minutes Intravenous Every 8 hours 11/23/17 1031 11/25/17 1254   11/23/17 0000  metroNIDAZOLE (FLAGYL) IVPB 500 mg  Status:  Discontinued     500 mg 100 mL/hr over 60 Minutes Intravenous Every 8 hours 11/22/17 2312 11/23/17 1014   11/22/17 2315  cefTRIAXone (ROCEPHIN) 2 g in sodium chloride 0.9 % 100 mL IVPB  Status:  Discontinued     2 g 200 mL/hr over 30 Minutes Intravenous Every 24 hours 11/22/17 2312 11/23/17 1014   11/22/17 1400  piperacillin-tazobactam (ZOSYN) IVPB 3.375 g  Status:  Discontinued     3.375 g 12.5 mL/hr over 240 Minutes Intravenous Every 8 hours 11/22/17 1043 11/22/17 2311   11/22/17 1045  vancomycin (VANCOCIN) IVPB 750 mg/150 ml premix  Status:  Discontinued     750 mg 150 mL/hr over 60 Minutes Intravenous Every 12 hours 11/22/17 1043 11/25/17 0129   11/21/17 1830  vancomycin (VANCOCIN) IVPB 750 mg/150 ml premix     750 mg 150 mL/hr over 60 Minutes Intravenous  Once 11/21/17 1816 11/21/17 2030   11/21/17 1715  vancomycin (VANCOCIN) IVPB 1000  mg/200 mL premix     1,000 mg 200 mL/hr over 60 Minutes Intravenous  Once 11/21/17 1710 11/21/17 1914   11/21/17 1715  piperacillin-tazobactam (ZOSYN) IVPB 3.375 g     3.375 g 12.5 mL/hr over 240 Minutes Intravenous  Once 11/21/17 1710 11/21/17 2030      Medications: Scheduled Meds: . famotidine  20 mg Oral BID  . gabapentin  400 mg Oral TID  . [START ON 11/28/2017] hepatitis b vaccine for adults  1 mL Intramuscular Once  . insulin aspart protamine- aspart  55 Units Subcutaneous BID WC  . levothyroxine  50 mcg Oral QAC breakfast  . lisinopril  10 mg Oral Daily  . sodium chloride flush  3 mL Intravenous Q12H  . traZODone  50 mg Oral QHS   Continuous Infusions: . lactated ringers    . pencillin G potassium IV 4 Million Units (11/27/17 1338)   PRN Meds:.acetaminophen **OR** acetaminophen, dextrose, hydrALAZINE, ondansetron **OR** ondansetron (ZOFRAN) IV, sodium chloride flush, sodium chloride flush, traMADol    Objective: Weight change:   Intake/Output Summary (Last 24 hours) at 11/27/2017 1630 Last data filed at 11/27/2017 0617 Gross per 24 hour  Intake 750 ml  Output 1525 ml  Net -775 ml   Blood pressure (!) 141/89, pulse  71, temperature 98.4 F (36.9 C), temperature source Oral, resp. rate 17, height 5\' 9"  (1.753 m), weight 86.8 kg, SpO2 98 %. Temp:  [98.4 F (36.9 C)-98.9 F (37.2 C)] 98.4 F (36.9 C) (09/11 0617) Pulse Rate:  [71-80] 71 (09/11 0617) Resp:  [17-20] 17 (09/11 0617) BP: (129-141)/(79-89) 141/89 (09/11 0617) SpO2:  [98 %-100 %] 98 % (09/11 0617)  Physical Exam: General: Alert and awake, oriented x3, not in any acute distress. HEENT: anicteric sclera, EOMI CVS regular rate, normal  Chest: , no wheezing, no respiratory distress Abdomen: soft non-distended,  Extremities: Skin: mx track marks  Left foot 11/25/17:        Right foot 11/26/17:      Neuro: nonfocal  CBC:    BMET Recent Labs    11/25/17 1534 11/26/17 0445 11/27/17 0312   NA 138  --   --   K 4.0  --   --   CL 106  --   --   CO2 23  --   --   GLUCOSE 241*  --   --   BUN 8  --   --   CREATININE 1.19 1.14 1.24  CALCIUM 8.4*  --   --      Liver Panel  No results for input(s): PROT, ALBUMIN, AST, ALT, ALKPHOS, BILITOT, BILIDIR, IBILI in the last 72 hours.     Sedimentation Rate No results for input(s): ESRSEDRATE in the last 72 hours. C-Reactive Protein No results for input(s): CRP in the last 72 hours.  Micro Results: Recent Results (from the past 720 hour(s))  Blood Culture (routine x 2)     Status: Abnormal   Collection Time: 11/21/17  1:30 PM  Result Value Ref Range Status   Specimen Description BLOOD RIGHT HAND  Final   Special Requests   Final    BOTTLES DRAWN AEROBIC AND ANAEROBIC Blood Culture results may not be optimal due to an excessive volume of blood received in culture bottles   Culture  Setup Time   Final    GRAM POSITIVE COCCI AEROBIC BOTTLE ONLY CRITICAL RESULT CALLED TO, READ BACK BY AND VERIFIED WITH: PHARMD L SEAY 11/22/17 AT 2226 BY CM GRAM POSITIVE RODS ANAEROBIC BOTTLE ONLY CRITICAL RESULT CALLED TO, READ BACK BY AND VERIFIED WITH: D PEARCE,PHARMD AT 1710 11/23/17 BY L BENFIELD    Culture (A)  Final    VIRIDANS STREPTOCOCCUS THE SIGNIFICANCE OF ISOLATING THIS ORGANISM FROM A SINGLE SET OF BLOOD CULTURES WHEN MULTIPLE SETS ARE DRAWN IS UNCERTAIN. PLEASE NOTIFY THE MICROBIOLOGY DEPARTMENT WITHIN ONE WEEK IF SPECIATION AND SENSITIVITIES ARE REQUIRED. ACTINOMYCES NAESLUNDII Standardized susceptibility testing for this organism is not available. Performed at [facility], [place] [address], [place], [place] [address]    Report Status 11/25/2017 FINAL  Final  Blood Culture ID Panel (Reflexed)     Status: Abnormal   Collection Time: 11/21/17  1:30 PM  Result Value Ref Range Status   Enterococcus species NOT DETECTED NOT DETECTED Final   Listeria monocytogenes NOT DETECTED NOT DETECTED Final   Staphylococcus species NOT  DETECTED NOT DETECTED Final   Staphylococcus aureus NOT DETECTED NOT DETECTED Final   Streptococcus species DETECTED (A) NOT DETECTED Final    Comment: Not Enterococcus species, Streptococcus agalactiae, Streptococcus pyogenes, or Streptococcus pneumoniae. CRITICAL RESULT CALLED TO, READ BACK BY AND VERIFIED WITH: PHARMD L SEAY 11/22/17 AT 2227 BY CM    Streptococcus agalactiae NOT DETECTED NOT DETECTED Final   Streptococcus pneumoniae NOT DETECTED  NOT DETECTED Final   Streptococcus pyogenes NOT DETECTED NOT DETECTED Final   Acinetobacter baumannii NOT DETECTED NOT DETECTED Final   Enterobacteriaceae species NOT DETECTED NOT DETECTED Final   Enterobacter cloacae complex NOT DETECTED NOT DETECTED Final   Escherichia coli NOT DETECTED NOT DETECTED Final   Klebsiella oxytoca NOT DETECTED NOT DETECTED Final   Klebsiella pneumoniae NOT DETECTED NOT DETECTED Final   Proteus species NOT DETECTED NOT DETECTED Final   Serratia marcescens NOT DETECTED NOT DETECTED Final   Haemophilus influenzae NOT DETECTED NOT DETECTED Final   Neisseria meningitidis NOT DETECTED NOT DETECTED Final   Pseudomonas aeruginosa NOT DETECTED NOT DETECTED Final   Candida albicans NOT DETECTED NOT DETECTED Final   Candida glabrata NOT DETECTED NOT DETECTED Final   Candida krusei NOT DETECTED NOT DETECTED Final   Candida parapsilosis NOT DETECTED NOT DETECTED Final   Candida tropicalis NOT DETECTED NOT DETECTED Final    Comment: Performed at [facility], [place] [address] [place], [place] [address]  Blood Culture (routine x 2)     Status: None   Collection Time: 11/21/17  8:10 PM  Result Value Ref Range Status   Specimen Description BLOOD SITE NOT SPECIFIED  Final   Special Requests   Final    BOTTLES DRAWN AEROBIC ONLY Blood Culture results may not be optimal due to an inadequate volume of blood received in culture bottles   Culture   Final    NO GROWTH 5 DAYS Performed at [facility], [place] [address], [place], [place] [address]    Report Status 11/26/2017 FINAL  Final  MRSA PCR Screening     Status: None   Collection Time: 11/21/17  8:19 PM  Result Value Ref Range Status   MRSA by PCR NEGATIVE NEGATIVE Final    Comment:        The GeneXpert MRSA Assay (FDA approved for NASAL specimens only), is one component of a comprehensive MRSA colonization surveillance program. It is not intended to diagnose MRSA infection nor to guide or monitor treatment for MRSA infections. Performed at [facility], [place] [address], [place], [place] [address]   Aerobic Culture (superficial specimen)     Status: None   Collection Time: 11/23/17  1:35 PM  Result Value Ref Range Status   Specimen Description WOUND FOOT LEFT  Final   Special Requests Normal  Final   Gram Stain   Final    MODERATE WBC PRESENT, PREDOMINANTLY PMN MODERATE GRAM POSITIVE RODS FEW GRAM NEGATIVE RODS RARE GRAM POSITIVE COCCI IN PAIRS    Culture   Final    FEW STREPTOCOCCUS GROUP F MODERATE LACTOBACILLUS SPECIES Standardized susceptibility testing for this organism is not available. Performed at [facility], [place] [address] [place], [place] [address]    Report Status 11/27/2017 FINAL  Final  Surgical pcr screen     Status: None   Collection Time: 11/24/17 12:03 AM  Result Value Ref Range Status   MRSA, PCR NEGATIVE NEGATIVE Final   Staphylococcus aureus NEGATIVE NEGATIVE Final    Comment: (NOTE) The Xpert SA Assay (FDA approved for NASAL specimens in patients 53 years of age and older), is one component of a comprehensive surveillance program. It is not intended to diagnose infection nor to guide or monitor treatment. Performed at [facility], [place] [address], [place], [place] [address]     Studies/Results: No results found.    Assessment/Plan:  INTERVAL HISTORY: actinomyces is  growing from blood culture  A Streptococcus group F species and lactobacillus species are growing from his wound  on the left side  Principal Problem:   Sepsis due to cellulitis [facility]) Active Problems:   Drug abuse, IV ([place])   Tobacco abuse   DM type 2, uncontrolled, with renal complications ([place])   Essential hypertension   Abscess   DKA (diabetic ketoacidoses) ([name])   Diabetic polyneuropathy associated with type 2 diabetes mellitus ([name])   GERD (gastroesophageal reflux disease)   Bacteremia   Hepatitis C   Actinomyces infection   Cellulitis of left foot   Acute hematogenous osteomyelitis, left ankle and foot ([place])    Luke [name] is a 37 y.o. male with IV drug use and foot infection also with bacteremia with actinomyces species, tissue infections and OSTEOMYELITIS OF LEFT MEDIAL MALLEOLUS  Cultures of foot have isolated lactobacillus in a group F streptococcal species.  Osteomyelitis in the medial malleolus is an absolute disaster.  I am grateful to Dr. Sharol Given is going to taken to the operating room to debride this area.  I have emphasized to the patient that he is at very high risk of losing this limb without proper surgical and medical management.  Also have debridement of his left foot wounds and his right foot infection which is still not drained spontaneously.  This may be a mixed infection but with actinomyces present this means that we really need protracted antibiotics  As mentioned yesterday proper therapy for this would be 6 weeks of IV penicillin followed by protracted oral antibiotics likely with Augmentin for potentially a year.  Patient is still very much not very enthusiastic about staying in the hospital and so I suspect he will still want to go home though I have emphasized to him that the risk of him losing the limb will be may be higher with him not receiving aggressive parenteral antibiotics.  We are also trying to obtain TEE (posisbly while he is in OR)   He tells me he is willing to stop smoking as well and his very emphatic that he will never use IV drugs again.  IVs  mellitus is also not well managed 12 and he acquired it in his early 78s and it is IDDM  Hepatitis C seropositivity he has hepatitis C RNA present.  I suspect he has chronic hepatitis C he is not immune to hep B and I will give him a vaccine tomorrow morning for hepatitis B also check a total hepatitis A antibody see if he needs vaccination for this virus.  If he needs treatment for chronic hepatitis C would be happy to do this as an outpatient.  Dr. Baxter Flattery will be checking on the patient tomorrow also covering on Friday     LOS: 6 days   Alcide Evener 11/27/2017, 4:30 PM

## 2017-11-27 NOTE — Progress Notes (Signed)
CSW received consult for SNF placement for long term IV abx. There are no SNF offers, so patient will have to remain in the hospital to complete the treatment.   Luke Locus Jaccob Czaplicki LCSW [phone]

## 2017-11-27 NOTE — Progress Notes (Signed)
PROGRESS NOTE        [name] Age: 37 y.o. Sex: male Date of Birth: Jun 13, 1980 Admit Date: 11/21/2017 Admitting Physician Patrecia Pour, MD GXQ:JJHERDE, [name], NP  Brief Narrative: Patient is a 37 y.o. male with history of IVDA (relapse recently) insulin-dependent diabetes, hypertension who presented with sepsis secondary to soft tissue infection of bilateral feet, and osteomyelitis of the medial malleolus.  See below for further details.  Subjective: No chest pain or shortness of breath.  Lying comfortably in bed.  Assessment/Plan: Sepsis secondary to bilateral lower extremity soft tissue infection with abscesses and osteomyelitis of the left medial malleolus along with actinomyces bacteremia: Sepsis pathophysiology has resolved.  Continue IV penicillin G, orthopedic following with plans to or debridement later today.  TEE possibly tomorrow-TTE did not show any obvious vegetations.  Hopefully with these measures-we can salvage his limb-but he is aware that he is at risk of limb loss at this point.  DKA: Resolved with insulin infusion.  Insulin-dependent diabetes: Overall poorly controlled.  A1c 12.1.  CBGs relatively stable-some fluctuation of sugars due to n.p.o. status.  Continue insulin 70/30 55 units twice daily.  We will follow and optimize accordingly  Hypertension: Controlled, continue lisinopril  Hypothyroidism: Continue levothyroxine  Peripheral neuropathy: Suspect related to diabetes-continue Neurontin  Chronic hepatitis C: We will need outpatient follow-up at the ID clinic.  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  Claims he was clean for 2 years.  He has a history of numerous soft tissue infections in his bilateral upper extremity-and apparently has a history of fasciotomy in his left upper extremity.  He has been counseled extensively  this morning.  DVT Prophylaxis: SCD's-Start pharmacological prophylaxis once all or procedures are complete.  Code Status: Full code   Family Communication: None at bedside  Disposition Plan: Remain inpatient-will require several days of hospitalization.  Antimicrobial agents: Anti-infectives (From admission, onward)   Start     Dose/Rate Route Frequency Ordered Stop   11/27/17 0730  ceFAZolin (ANCEF) IVPB 2g/100 mL premix     2 g 200 mL/hr over 30 Minutes Intravenous To Short Stay 11/27/17 0716 11/28/17 0730   11/25/17 1400  penicillin G potassium 4 Million Units in dextrose 5 % 250 mL IVPB     4 Million Units 250 mL/hr over 60 Minutes Intravenous Every 4 hours 11/25/17 1254     11/25/17 0800  vancomycin (VANCOCIN) IVPB 1000 mg/200 mL premix  Status:  Discontinued     1,000 mg 200 mL/hr over 60 Minutes Intravenous Every 8 hours 11/25/17 0129 11/25/17 1413   11/24/17 0600  ceFAZolin (ANCEF) IVPB 2g/100 mL premix     2 g 200 mL/hr over 30 Minutes Intravenous On call to O.R. 11/23/17 2306 11/25/17 0559   11/23/17 1400  piperacillin-tazobactam (ZOSYN) IVPB 3.375 g  Status:  Discontinued     3.375 g 12.5 mL/hr over 240 Minutes Intravenous Every 8 hours 11/23/17 1031 11/25/17 1254   11/23/17 0000  metroNIDAZOLE (FLAGYL) IVPB 500 mg  Status:  Discontinued     500 mg 100 mL/hr over 60 Minutes Intravenous Every 8 hours 11/22/17 2312 11/23/17 1014   11/22/17 2315  cefTRIAXone (ROCEPHIN) 2 g in sodium chloride 0.9 % 100 mL IVPB  Status:  Discontinued     2  g 200 mL/hr over 30 Minutes Intravenous Every 24 hours 11/22/17 2312 11/23/17 1014   11/22/17 1400  piperacillin-tazobactam (ZOSYN) IVPB 3.375 g  Status:  Discontinued     3.375 g 12.5 mL/hr over 240 Minutes Intravenous Every 8 hours 11/22/17 1043 11/22/17 2311   11/22/17 1045  vancomycin (VANCOCIN) IVPB 750 mg/150 ml premix  Status:  Discontinued     750 mg 150 mL/hr over 60 Minutes Intravenous Every 12 hours 11/22/17 1043  11/25/17 0129   11/21/17 1830  vancomycin (VANCOCIN) IVPB 750 mg/150 ml premix     750 mg 150 mL/hr over 60 Minutes Intravenous  Once 11/21/17 1816 11/21/17 2030   11/21/17 1715  vancomycin (VANCOCIN) IVPB 1000 mg/200 mL premix     1,000 mg 200 mL/hr over 60 Minutes Intravenous  Once 11/21/17 1710 11/21/17 1914   11/21/17 1715  piperacillin-tazobactam (ZOSYN) IVPB 3.375 g     3.375 g 12.5 mL/hr over 240 Minutes Intravenous  Once 11/21/17 1710 11/21/17 2030      Procedures: 9/9>>TTE: - Left ventricle: There was mild concentric hypertrophy. Systolic   function was normal. The estimated ejection fraction was in the   range of 60% to 65%. Left ventricular diastolic function   parameters were normal. - Aortic valve: Transvalvular velocity was within the normal range.   There was no stenosis. There was mild regurgitation. Valve area   (VTI): 2.83 cm^2. Valve area (Vmax): 2.61 cm^2. Valve area   (Vmean): 2.72 cm^2. Regurgitation pressure half-time: 944 ms. - Left atrium: The atrium was mildly dilated. - Atrial septum: No defect or patent foramen ovale was identified   by color flow Doppler. - Tricuspid valve: There was mild regurgitation. - Pulmonic valve: There was moderate regurgitation. - Pulmonary arteries: Systolic pressure was within the normal   range. PA peak pressure: 30 mm Hg (S). - Pericardium, extracardiac: A trivial pericardial effusion was   identified.  CONSULTS:  ID and orthopedic surgery  Time spent: 25- minutes-Greater than 50% of this time was spent in counseling, explanation of diagnosis, planning of further management, and coordination of care.  MEDICATIONS: Scheduled Meds: . famotidine  20 mg Oral BID  . gabapentin  400 mg Oral TID  . insulin aspart protamine- aspart  55 Units Subcutaneous BID WC  . levothyroxine  50 mcg Oral QAC breakfast  . lisinopril  10 mg Oral Daily  . povidone-iodine  2 application Topical Once  . sodium chloride flush  3 mL  Intravenous Q12H  . traZODone  50 mg Oral QHS   Continuous Infusions: .  ceFAZolin (ANCEF) IV    . pencillin G potassium IV 4 Million Units (11/27/17 0651)   PRN Meds:.acetaminophen **OR** acetaminophen, dextrose, hydrALAZINE, ondansetron **OR** ondansetron (ZOFRAN) IV, sodium chloride flush, sodium chloride flush, traMADol   PHYSICAL EXAM: Vital signs: Vitals:   11/26/17 0459 11/26/17 1649 11/26/17 2157 11/27/17 0617  BP: (!) 146/83 136/85 129/79 (!) 141/89  Pulse: 72 76 80 71  Resp: 17 20 17 17   Temp: 97.7 F (36.5 C) 98.9 F (37.2 C) 98.8 F (37.1 C) 98.4 F (36.9 C)  TempSrc:   Oral Oral  SpO2: 98% 100% 100% 98%  Weight:      Height:       Filed Weights   11/23/17 0227  Weight: 86.8 kg   Body mass index is 28.26 kg/m.   General appearance :Awake, alert, not in any distress. Speech Clear. Eyes:Pink conjunctiva HEENT: Atraumatic and Normocephalic Neck: supple Resp:Good air entry bilaterally,  no added sounds  CVS: S1 S2 regular, no murmurs.  GI: Bowel sounds present, Non tender and not distended with no gaurding, rigidity or rebound.No organomegaly Neurology:  speech clear,Non focal, sensation is grossly intact. Psychiatric: Normal judgment and insight. Alert and oriented x 3. Normal mood. Musculoskeletal:No digital cyanosis Skin:No Rash, warm and dry  I have personally reviewed following labs and imaging studies  LABORATORY DATA: CBC: Recent Labs  Lab 11/21/17 1332 11/22/17 1750 11/23/17 0410 11/24/17 0347 11/25/17 0319 11/26/17 0445  WBC 24.1* 19.5* 16.9* 16.8* 15.8* 15.4*  NEUTROABS 18.0*  --   --   --   --  8.9*  HGB 11.9* 9.3* 9.4* 9.4* 9.6* 10.4*  HCT 36.1* 28.9* 28.4* 29.2* 29.4* 32.4*  MCV 83.8 83.5 82.1 83.0 83.1 82.9  PLT 560* 453* 426* 474* 504* 576*    Basic Metabolic Panel: Recent Labs  Lab 11/21/17 1332 11/22/17 1750 11/23/17 0410 11/24/17 0347 11/25/17 0319 11/25/17 1534 11/26/17 0445 11/27/17 0312  NA 131* 138 135 137  --   138  --   --   K 4.5 3.6 4.5 4.0  --  4.0  --   --   CL 95* 107 107 107  --  106  --   --   CO2 19* 23 20* 23  --  23  --   --   GLUCOSE 682* 169* 400* 246*  --  241*  --   --   BUN 19 11 10 11   --  8  --   --   CREATININE 1.95* 1.16 1.31* 1.16 1.15 1.19 1.14 1.24  CALCIUM 8.8* 8.2* 8.2* 8.2*  --  8.4*  --   --     GFR: Estimated Creatinine Clearance: 88.9 mL/min (by C-G formula based on SCr of 1.24 mg/dL).  Liver Function Tests: Recent Labs  Lab 11/21/17 1332 11/22/17 1750 11/23/17 0410  AST 12* 15 16  ALT 14 11 11   ALKPHOS 128* 94 88  BILITOT 1.2 0.7 0.4  PROT 6.9 5.5* 5.5*  ALBUMIN 2.9* 2.2* 2.1*   No results for input(s): LIPASE, AMYLASE in the last 168 hours. No results for input(s): AMMONIA in the last 168 hours.  Coagulation Profile: No results for input(s): INR, PROTIME in the last 168 hours.  Cardiac Enzymes: No results for input(s): CKTOTAL, CKMB, CKMBINDEX, TROPONINI in the last 168 hours.  BNP (last 3 results) No results for input(s): PROBNP in the last 8760 hours.  HbA1C: No results for input(s): HGBA1C in the last 72 hours.  CBG: Recent Labs  Lab 11/26/17 0814 11/26/17 1157 11/26/17 1550 11/26/17 2156 11/27/17 0844  GLUCAP 154* 198* 180* 96 289*    Lipid Profile: No results for input(s): CHOL, HDL, LDLCALC, TRIG, CHOLHDL, LDLDIRECT in the last 72 hours.  Thyroid Function Tests: No results for input(s): TSH, T4TOTAL, FREET4, T3FREE, THYROIDAB in the last 72 hours.  Anemia Panel: No results for input(s): VITAMINB12, FOLATE, FERRITIN, TIBC, IRON, RETICCTPCT in the last 72 hours.  Urine analysis:    Component Value Date/Time   COLORURINE STRAW (A) 11/21/2017 1320   APPEARANCEUR CLEAR 11/21/2017 1320   APPEARANCEUR Clear 04/11/2014 0138   LABSPEC 1.027 11/21/2017 1320   LABSPEC 1.026 04/11/2014 0138   PHURINE 6.0 11/21/2017 1320   GLUCOSEU >=500 (A) 11/21/2017 1320   GLUCOSEU >=500 04/11/2014 0138   HGBUR MODERATE (A) 11/21/2017 1320    BILIRUBINUR NEGATIVE 11/21/2017 1320   BILIRUBINUR neg 02/18/2017 0946   BILIRUBINUR Negative 04/11/2014 0138   KETONESUR  20 (A) 11/21/2017 1320   PROTEINUR 30 (A) 11/21/2017 1320   UROBILINOGEN 0.2 02/18/2017 0946   UROBILINOGEN 0.2 12/10/2013 1221   NITRITE NEGATIVE 11/21/2017 1320   LEUKOCYTESUR NEGATIVE 11/21/2017 1320   LEUKOCYTESUR Negative 04/11/2014 0138    Sepsis Labs: Lactic Acid, Venous    Component Value Date/Time   LATICACIDVEN 1.77 11/21/2017 1957    MICROBIOLOGY: Recent Results (from the past 240 hour(s))  Blood Culture (routine x 2)     Status: Abnormal   Collection Time: 11/21/17  1:30 PM  Result Value Ref Range Status   Specimen Description BLOOD RIGHT HAND  Final   Special Requests   Final    BOTTLES DRAWN AEROBIC AND ANAEROBIC Blood Culture results may not be optimal due to an excessive volume of blood received in culture bottles   Culture  Setup Time   Final    GRAM POSITIVE COCCI AEROBIC BOTTLE ONLY CRITICAL RESULT CALLED TO, READ BACK BY AND VERIFIED WITH: PHARMD L SEAY 11/22/17 AT 2226 BY CM GRAM POSITIVE RODS ANAEROBIC BOTTLE ONLY CRITICAL RESULT CALLED TO, READ BACK BY AND VERIFIED WITH: D PEARCE,PHARMD AT 1710 11/23/17 BY L BENFIELD    Culture (A)  Final    VIRIDANS STREPTOCOCCUS THE SIGNIFICANCE OF ISOLATING THIS ORGANISM FROM A SINGLE SET OF BLOOD CULTURES WHEN MULTIPLE SETS ARE DRAWN IS UNCERTAIN. PLEASE NOTIFY THE MICROBIOLOGY DEPARTMENT WITHIN ONE WEEK IF SPECIATION AND SENSITIVITIES ARE REQUIRED. ACTINOMYCES NAESLUNDII Standardized susceptibility testing for this organism is not available. Performed at [facility], [place] [address], [place], [place] [address]    Report Status 11/25/2017 FINAL  Final  Blood Culture ID Panel (Reflexed)     Status: Abnormal   Collection Time: 11/21/17  1:30 PM  Result Value Ref Range Status   Enterococcus species NOT DETECTED NOT DETECTED Final   Listeria monocytogenes NOT DETECTED NOT DETECTED Final    Staphylococcus species NOT DETECTED NOT DETECTED Final   Staphylococcus aureus NOT DETECTED NOT DETECTED Final   Streptococcus species DETECTED (A) NOT DETECTED Final    Comment: Not Enterococcus species, Streptococcus agalactiae, Streptococcus pyogenes, or Streptococcus pneumoniae. CRITICAL RESULT CALLED TO, READ BACK BY AND VERIFIED WITH: PHARMD L SEAY 11/22/17 AT 2227 BY CM    Streptococcus agalactiae NOT DETECTED NOT DETECTED Final   Streptococcus pneumoniae NOT DETECTED NOT DETECTED Final   Streptococcus pyogenes NOT DETECTED NOT DETECTED Final   Acinetobacter baumannii NOT DETECTED NOT DETECTED Final   Enterobacteriaceae species NOT DETECTED NOT DETECTED Final   Enterobacter cloacae complex NOT DETECTED NOT DETECTED Final   Escherichia coli NOT DETECTED NOT DETECTED Final   Klebsiella oxytoca NOT DETECTED NOT DETECTED Final   Klebsiella pneumoniae NOT DETECTED NOT DETECTED Final   Proteus species NOT DETECTED NOT DETECTED Final   Serratia marcescens NOT DETECTED NOT DETECTED Final   Haemophilus influenzae NOT DETECTED NOT DETECTED Final   Neisseria meningitidis NOT DETECTED NOT DETECTED Final   Pseudomonas aeruginosa NOT DETECTED NOT DETECTED Final   Candida albicans NOT DETECTED NOT DETECTED Final   Candida glabrata NOT DETECTED NOT DETECTED Final   Candida krusei NOT DETECTED NOT DETECTED Final   Candida parapsilosis NOT DETECTED NOT DETECTED Final   Candida tropicalis NOT DETECTED NOT DETECTED Final    Comment: Performed at [facility], [place] [address] [place], [place] [address]  Blood Culture (routine x 2)     Status: None   Collection Time: 11/21/17  8:10 PM  Result Value Ref Range Status  Specimen Description BLOOD SITE NOT SPECIFIED  Final   Special Requests   Final    BOTTLES DRAWN AEROBIC ONLY Blood Culture results may not be optimal due to an inadequate volume of blood received in culture bottles   Culture   Final    NO GROWTH 5 DAYS Performed at [facility], [place] [address], [place], [place] [address]    Report Status 11/26/2017 FINAL  Final  MRSA PCR Screening     Status: None   Collection Time: 11/21/17  8:19 PM  Result Value Ref Range Status   MRSA by PCR NEGATIVE NEGATIVE Final    Comment:        The GeneXpert MRSA Assay (FDA approved for NASAL specimens only), is one component of a comprehensive MRSA colonization surveillance program. It is not intended to diagnose MRSA infection nor to guide or monitor treatment for MRSA infections. Performed at [facility], [place] [address], [place], [place] [address]   Aerobic Culture (superficial specimen)     Status: None (Preliminary result)   Collection Time: 11/23/17  1:35 PM  Result Value Ref Range Status   Specimen Description WOUND FOOT LEFT  Final   Special Requests Normal  Final   Gram Stain   Final    MODERATE WBC PRESENT, PREDOMINANTLY PMN MODERATE GRAM POSITIVE RODS FEW GRAM NEGATIVE RODS RARE GRAM POSITIVE COCCI IN PAIRS    Culture   Final    FEW STREPTOCOCCUS GROUP F CULTURE REINCUBATED FOR BETTER GROWTH Performed at [facility], [place] [address] [place], [place] [address]    Report Status PENDING  Incomplete  Surgical pcr screen     Status: None   Collection Time: 11/24/17 12:03 AM  Result Value Ref Range Status   MRSA, PCR NEGATIVE NEGATIVE Final   Staphylococcus aureus NEGATIVE NEGATIVE Final    Comment: (NOTE) The Xpert SA Assay (FDA approved for NASAL specimens in patients 51 years of age and older), is one component of a comprehensive surveillance program. It is not intended to diagnose infection nor to guide or monitor treatment. Performed at [facility], [place] [address], [place], [place] [address]     RADIOLOGY STUDIES/RESULTS: Mr Foot Left W Wo Contrast  Result Date: 11/22/2017 CLINICAL DATA:  IV drug abuser who injects in his feet. Bilateral foot wounds for 1 week. Chills and malaise. EXAM: MRI OF THE LEFT FOREFOOT WITHOUT AND  WITH CONTRAST TECHNIQUE: Multiplanar, multisequence MR imaging of the left forefoot was performed both before and after administration of intravenous contrast. CONTRAST:  8.5 cc Gadavist IV COMPARISON:  Plain films left foot 11/21/2017. FINDINGS: Bones/Joint/Cartilage There is partial visualization of marrow edema in the medial malleolus with postcontrast enhancement. Bone marrow signal is otherwise normal. Ligaments Intact Muscles and Tendons No intramuscular fluid collection is identified. Soft tissues Diffuse subcutaneous edema enhancement are seen about the foot. There is an abscess in the dorsal subcutaneous tissues centered over the bases of the first through third metatarsals. The abscess measures 1.8 cm craniocaudal by 4.6 cm transverse by 4.4 cm long. A second abscess in the subcutaneous tissues measuring 2.5 cm long by 2.3 cm craniocaudal by 0.9 cm transverse is identified along the proximal first metatarsal. This abscess is centered 1.5 cm distal to the first tarsometatarsal joint. A third abscess which is incompletely visualized is seen in subcutaneous tissues 3.2 cm superior to the tip of the medial malleolus. This collection measures approximately 1.5 cm craniocaudal by 0.2  cm transverse. IMPRESSION: Three subcutaneous abscesses are identified as described above. The largest collection is in the dorsal soft tissues at the level of the proximal first and third metatarsals. Edema and enhancement the medial malleolus consistent with osteomyelitis. Electronically Signed   By: Inge Rise M.D.   On: 11/22/2017 13:01   Dg Foot Complete Left  Result Date: 11/21/2017 CLINICAL DATA:  Bilateral foot pain for 4-5 days, left-greater-than-right, swelling and open sores, the patient is diabetic, and has a smoking history EXAM: LEFT FOOT - COMPLETE 3+ VIEW COMPARISON:  None. FINDINGS: There is soft tissue swelling over the dorsum of the left foot. However no underlying focal demineralization or erosion is  seen to indicate osteomyelitis. No acute abnormality is noted. Joint spaces appear normal. No erosion is seen. IMPRESSION: Soft tissue swelling over the dorsum of the foot. No underlying bony abnormality. Electronically Signed   By: Ivar Drape M.D.   On: 11/21/2017 15:30   Dg Foot Complete Right  Result Date: 11/21/2017 CLINICAL DATA:  Bilateral foot pain for 4-5 days, left-greater-than-right with swelling and redness, the patient is diabetic and does have a smoking history EXAM: RIGHT FOOT COMPLETE - 3+ VIEW COMPARISON:  None FINDINGS: There is mild soft tissue swelling over the dorsum of the right foot. No underlying bony demineralization or erosion is seen to indicate active osteomyelitis. Alignment is normal and joint spaces appear normal. IMPRESSION: Soft tissue swelling over the dorsum. No evidence of active osteomyelitis. Electronically Signed   By: Ivar Drape M.D.   On: 11/21/2017 15:32   [place] Ekg Site Rite  Result Date: 11/22/2017 If Site Rite image not attached, placement could not be confirmed due to current cardiac rhythm.    LOS: 6 days   Oren Binet, MD  [facility]  If 7PM-7AM, please contact night-coverage  Please page via [email or web address]-click on MD name and type text message  11/27/2017, 10:11 AM

## 2017-11-27 NOTE — Interval H&P Note (Signed)
History and Physical Interval Note:  11/27/2017 6:44 AM  Luke [name]  has presented today for surgery, with the diagnosis of Abscess Bilateral Feet  The various methods of treatment have been discussed with the patient and family. After consideration of risks, benefits and other options for treatment, the patient has consented to  Procedure(s): DEBRIDEMENT BILATERAL FEET, DEBRIDEMENT LEFT ANKLE, AND APPLY WOUND VAC (Bilateral) as a surgical intervention .  The patient's history has been reviewed, patient examined, no change in status, stable for surgery.  I have reviewed the patient's chart and labs.  Questions were answered to the patient's satisfaction.     [name]

## 2017-11-28 ENCOUNTER — Inpatient Hospital Stay (HOSPITAL_COMMUNITY): Payer: Medicaid Other | Admitting: Certified Registered Nurse Anesthetist

## 2017-11-28 ENCOUNTER — Ambulatory Visit (INDEPENDENT_AMBULATORY_CARE_PROVIDER_SITE_OTHER): Payer: Self-pay | Admitting: Physician Assistant

## 2017-11-28 ENCOUNTER — Encounter (HOSPITAL_COMMUNITY)
Admission: EM | Disposition: A | Discharge status: Home / Self Care | Payer: Self-pay | Source: Home / Self Care | Attending: Internal Medicine

## 2017-11-28 ENCOUNTER — Encounter (HOSPITAL_COMMUNITY): Payer: Self-pay | Admitting: *Deleted

## 2017-11-28 ENCOUNTER — Other Ambulatory Visit: Payer: Self-pay

## 2017-11-28 ENCOUNTER — Inpatient Hospital Stay (HOSPITAL_COMMUNITY): Payer: Medicaid Other

## 2017-11-28 DIAGNOSIS — I351 Nonrheumatic aortic (valve) insufficiency: Secondary | ICD-10-CM

## 2017-11-28 DIAGNOSIS — Z95828 Presence of other vascular implants and grafts: Secondary | ICD-10-CM

## 2017-11-28 HISTORY — PX: TEE WITHOUT CARDIOVERSION: SHX5443

## 2017-11-28 LAB — HCV RNA QUANT RFLX ULTRA OR GENOTYP
HCV RNA QNT(LOG COPY/ML): 6.734 {Log_IU}/mL
HEPATITIS C QUANTITATION: 5420000 [IU]/mL

## 2017-11-28 LAB — HEPATITIS C GENOTYPE

## 2017-11-28 LAB — GLUCOSE, CAPILLARY
GLUCOSE-CAPILLARY: 331 mg/dL — AB (ref 70–99)
Glucose-Capillary: 80 mg/dL (ref 70–99)
Glucose-Capillary: 130 mg/dL — ABNORMAL HIGH (ref 70–99)
Glucose-Capillary: 175 mg/dL — ABNORMAL HIGH (ref 70–99)
Glucose-Capillary: 343 mg/dL — ABNORMAL HIGH (ref 70–99)
Glucose-Capillary: 195 mg/dL — ABNORMAL HIGH (ref 70–99)

## 2017-11-28 LAB — CREATININE, SERUM: CREATININE: 1.26 mg/dL — AB (ref 0.61–1.24)

## 2017-11-28 SURGERY — ECHOCARDIOGRAM, TRANSESOPHAGEAL
Anesthesia: Monitor Anesthesia Care

## 2017-11-28 MED ORDER — HEPATITIS B VAC RECOMBINANT 10 MCG/0.5ML IJ SUSP
1.0000 mL | Freq: Once | INTRAMUSCULAR | Status: DC
  Filled 2017-11-28: qty 1

## 2017-11-28 MED ORDER — SODIUM CHLORIDE 0.9 % IV SOLN
INTRAVENOUS | Status: DC
  Administered 2017-11-28: 08:00:00 via INTRAVENOUS

## 2017-11-28 MED ORDER — PROPOFOL 10 MG/ML IV BOLUS
INTRAVENOUS | Status: DC | PRN
  Administered 2017-11-28 (×2): 30 mg via INTRAVENOUS

## 2017-11-28 MED ORDER — PROPOFOL 500 MG/50ML IV EMUL
INTRAVENOUS | Status: DC | PRN
  Administered 2017-11-28: 150 ug/kg/min via INTRAVENOUS

## 2017-11-28 MED ORDER — HEPARIN SOD (PORK) LOCK FLUSH 100 UNIT/ML IV SOLN
250.0000 [IU] | INTRAVENOUS | Status: AC | PRN
  Administered 2017-11-28: 250 [IU]

## 2017-11-28 NOTE — Anesthesia Postprocedure Evaluation (Signed)
Anesthesia Post Note  Patient: Cyprian D Prehn  Procedure(s) Performed: TRANSESOPHAGEAL ECHOCARDIOGRAM (TEE) (N/A )     Patient location during evaluation: PACU Anesthesia Type: MAC Level of consciousness: awake and alert Pain management: pain level controlled Vital Signs Assessment: post-procedure vital signs reviewed and stable Respiratory status: spontaneous breathing, nonlabored ventilation and respiratory function stable Cardiovascular status: stable and blood pressure returned to baseline Anesthetic complications: no    Last Vitals:  Vitals:   11/28/17 0854 11/28/17 0929  BP: 109/65 126/85  Pulse: 84 80  Resp: 17 18  Temp:  36.7 C  SpO2: 99% 99%    Last Pain:  Vitals:   11/28/17 0929  TempSrc: Oral  PainSc: 0-No pain                 Audry Pili

## 2017-11-28 NOTE — Anesthesia Preprocedure Evaluation (Addendum)
Anesthesia Evaluation  Patient identified by MRN, date of birth, ID band Patient awake    Reviewed: Allergy & Precautions, NPO status , Patient's Chart, lab work & pertinent test results  History of Anesthesia Complications Negative for: history of anesthetic complications  Airway Mallampati: II  TM Distance: >3 FB Neck ROM: Full    Dental  (+) Dental Advisory Given, Teeth Intact   Pulmonary Current Smoker,    breath sounds clear to auscultation       Cardiovascular hypertension, Pt. on medications + Valvular Problems/Murmurs  Rhythm:Regular Rate:Normal   '19 TTE - Mild concentric LVH. EF 60% to 65%. Mild AI. LA was mildly dilated. Mild TR, moderate PR. PASP: 30 mm Hg. A trivial pericardial effusion was identified.    Neuro/Psych  Headaches, Seizures -, Well Controlled,  Anxiety    GI/Hepatic GERD  Controlled and Medicated,(+)     substance abuse  cocaine use and IV drug use, Hepatitis -, C  Endo/Other  diabetes, Poorly Controlled, Insulin DependentHypothyroidism   Renal/GU Renal InsufficiencyRenal disease  negative genitourinary   Musculoskeletal negative musculoskeletal ROS (+)   Abdominal   Peds  Hematology  (+) anemia ,   Anesthesia Other Findings Bacteremia  Reproductive/Obstetrics                           Anesthesia Physical Anesthesia Plan  ASA: III  Anesthesia Plan: MAC   Post-op Pain Management:    Induction: Intravenous  PONV Risk Score and Plan: 1 and Propofol infusion and Treatment may vary due to age or medical condition  Airway Management Planned: Nasal Cannula and Natural Airway  Additional Equipment: None  Intra-op Plan:   Post-operative Plan:   Informed Consent: I have reviewed the patients History and Physical, chart, labs and discussed the procedure including the risks, benefits and alternatives for the proposed anesthesia with the patient or authorized  representative who has indicated his/her understanding and acceptance.     Plan Discussed with: CRNA and Anesthesiologist  Anesthesia Plan Comments:        Anesthesia Quick Evaluation

## 2017-11-28 NOTE — Progress Notes (Signed)
  Echocardiogram Echocardiogram Transesophageal has been performed.  Jennette Dubin 11/28/2017, 8:58 AM

## 2017-11-28 NOTE — Op Note (Signed)
INDICATIONS: bacteremia  PROCEDURE:   Informed consent was obtained prior to the procedure. The risks, benefits and alternatives for the procedure were discussed and the patient comprehended these risks.  Risks include, but are not limited to, cough, sore throat, vomiting, nausea, somnolence, esophageal and stomach trauma or perforation, bleeding, low blood pressure, aspiration, pneumonia, infection, trauma to the teeth and death.    After a procedural time-out, the oropharynx was anesthetized with 20% benzocaine spray.   During this procedure the patient was administered IV propofol by Anesthesiology (Dr. Fransisco Beau).  The transesophageal probe was inserted in the esophagus and stomach without difficulty and multiple views were obtained.  The patient was kept under observation until the patient left the procedure room.  The patient left the procedure room in stable condition.   Agitated microbubble saline contrast was not administered.  COMPLICATIONS:    There were no immediate complications.  FINDINGS:  No vegetations are seen. There is 1-2+ central aortic insufficiency.  Mild LVH. Chiari network (normal variant) is present.  RECOMMENDATIONS:    Evaluate for other sources of bacteremia.  Time Spent Directly with the Patient:  45 minutes   Luke [name] 11/28/2017, 8:40 AM

## 2017-11-28 NOTE — Progress Notes (Signed)
[facility]  Date of Admission:  11/21/2017   Total days of antibiotics 8        Day 4 penicillin 96m units Q24h          Patient ID: Luke [name] is a 37 y.o. 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 famotidine  20 mg Oral BID  . gabapentin  400 mg Oral TID  . hepatitis b vaccine  1 mL Intramuscular Once  . insulin aspart protamine- aspart  55 Units Subcutaneous BID WC  . levothyroxine  50 mcg Oral QAC breakfast  . lisinopril  10 mg Oral Daily  . sodium chloride flush  3 mL Intravenous Q12H  . traZODone  50 mg Oral QHS    SUBJECTIVE: "feel the best he has felt in years". Scheduled to have surgery for feet tomorrow. Very happy his heart test was negative. He is not having any symptoms concerning for withdrawal.    No Known Allergies  OBJECTIVE: Vitals:   11/28/17 0844 11/28/17 0854 11/28/17 0929 11/28/17 1400  BP: 104/64 109/65 126/85 (!) 136/95  Pulse: 93 84 80 88  Resp: 14 17 18 18   Temp: 98.6 F (37 C)  98.1 F (36.7 C) 98.1 F (36.7 C)  TempSrc: Oral  Oral Oral  SpO2: 98% 99% 99% 100%  Weight:      Height:       Body mass index is 28.26 kg/m.  Physical Exam  Constitutional: He is oriented to person, place, and time. He appears well-developed and well-nourished.  Resting comfortably in bed during visit.   HENT:  Mouth/Throat: Oropharynx is clear and moist and mucous membranes are normal. Normal dentition. No  dental abscesses.  Cardiovascular: Normal rate, regular rhythm and normal heart sounds.  Pulmonary/Chest: Effort normal and breath sounds normal.  Abdominal: Soft. He exhibits no distension. There is no tenderness.  Musculoskeletal:  Feet wrapped and not assessed today.   Lymphadenopathy:    He has no cervical adenopathy.  Neurological: He is alert and oriented to person, place, and time.  Skin: Skin is warm and dry. No rash noted.  Heavily scared and contracted skin on bilateral antecubital fossae.   Psychiatric: He has a normal mood and affect. Judgment normal.  Nursing note and vitals reviewed.   Lab Results Lab Results  Component Value Date   WBC 15.4 (H) 11/26/2017   HGB 10.4 (L) 11/26/2017   HCT 32.4 (L) 11/26/2017   MCV 82.9 11/26/2017   PLT 576 (H) 11/26/2017    Lab Results  Component Value Date   CREATININE 1.26 (H) 11/28/2017   BUN 8 11/25/2017   NA 138 11/25/2017   K 4.0 11/25/2017   CL 106 11/25/2017   CO2 23 11/25/2017    Lab Results  Component Value Date   ALT 11 11/23/2017   AST 16 11/23/2017   ALKPHOS 88 11/23/2017   BILITOT 0.4 11/23/2017  Microbiology: Recent Results (from the past 240 hour(s))  Blood Culture (routine x 2)     Status: Abnormal   Collection Time: 11/21/17  1:30 PM  Result Value Ref Range Status   Specimen Description BLOOD RIGHT HAND  Final   Special Requests   Final    BOTTLES DRAWN AEROBIC AND ANAEROBIC Blood Culture results may not be optimal due to an excessive volume of blood received in culture bottles   Culture  Setup Time   Final    GRAM POSITIVE COCCI AEROBIC BOTTLE ONLY CRITICAL RESULT CALLED TO, READ BACK BY AND VERIFIED WITH: PHARMD L SEAY 11/22/17 AT 2226 BY CM GRAM POSITIVE RODS ANAEROBIC BOTTLE ONLY CRITICAL RESULT CALLED TO, READ BACK BY AND VERIFIED WITH: D PEARCE,PHARMD AT 1710 11/23/17 BY L BENFIELD    Culture (A)  Final    VIRIDANS STREPTOCOCCUS THE SIGNIFICANCE OF ISOLATING THIS ORGANISM FROM A SINGLE SET  OF BLOOD CULTURES WHEN MULTIPLE SETS ARE DRAWN IS UNCERTAIN. PLEASE NOTIFY THE MICROBIOLOGY DEPARTMENT WITHIN ONE WEEK IF SPECIATION AND SENSITIVITIES ARE REQUIRED. ACTINOMYCES NAESLUNDII Standardized susceptibility testing for this organism is not available. Performed at [facility], [place] [address] [place], [place] [address]    Report Status 11/25/2017 FINAL  Final  Blood Culture ID Panel (Reflexed)     Status: Abnormal   Collection Time: 11/21/17  1:30 PM  Result Value Ref Range Status   Enterococcus species NOT DETECTED NOT DETECTED Final   Listeria monocytogenes NOT DETECTED NOT DETECTED Final   Staphylococcus species NOT DETECTED NOT DETECTED Final   Staphylococcus aureus NOT DETECTED NOT DETECTED Final   Streptococcus species DETECTED (A) NOT DETECTED Final    Comment: Not Enterococcus species, Streptococcus agalactiae, Streptococcus pyogenes, or Streptococcus pneumoniae. CRITICAL RESULT CALLED TO, READ BACK BY AND VERIFIED WITH: PHARMD L SEAY 11/22/17 AT 2227 BY CM    Streptococcus agalactiae NOT DETECTED NOT DETECTED Final   Streptococcus pneumoniae NOT DETECTED NOT DETECTED Final   Streptococcus pyogenes NOT DETECTED NOT DETECTED Final   Acinetobacter baumannii NOT DETECTED NOT DETECTED Final   Enterobacteriaceae species NOT DETECTED NOT DETECTED Final   Enterobacter cloacae complex NOT DETECTED NOT DETECTED Final   Escherichia coli NOT DETECTED NOT DETECTED Final   Klebsiella oxytoca NOT DETECTED NOT DETECTED Final   Klebsiella pneumoniae NOT DETECTED NOT DETECTED Final   Proteus species NOT DETECTED NOT DETECTED Final   Serratia marcescens NOT DETECTED NOT DETECTED Final   Haemophilus influenzae NOT DETECTED NOT DETECTED Final   Neisseria meningitidis NOT DETECTED NOT DETECTED Final   Pseudomonas aeruginosa NOT DETECTED NOT DETECTED Final   Candida albicans NOT DETECTED NOT DETECTED Final   Candida glabrata NOT DETECTED NOT DETECTED Final   Candida krusei NOT  DETECTED NOT DETECTED Final   Candida parapsilosis NOT DETECTED NOT DETECTED Final   Candida tropicalis NOT DETECTED NOT DETECTED Final    Comment: Performed at [facility], [place] [address] [place], [place] [address]  Blood Culture (routine x 2)     Status: None   Collection Time: 11/21/17  8:10 PM  Result Value Ref Range Status   Specimen Description BLOOD SITE NOT SPECIFIED  Final   Special Requests   Final    BOTTLES DRAWN AEROBIC ONLY Blood Culture results may not be optimal due to an inadequate volume of blood received in culture bottles   Culture   Final    NO GROWTH 5 DAYS Performed at [facility], [place]  [address] [place], [place] [address]    Report Status 11/26/2017 FINAL  Final  MRSA PCR Screening     Status: None   Collection Time: 11/21/17  8:19 PM  Result Value Ref Range Status   MRSA by PCR NEGATIVE NEGATIVE Final    Comment:        The GeneXpert MRSA Assay (FDA approved for NASAL specimens only), is one component of a comprehensive MRSA colonization surveillance program. It is not intended to diagnose MRSA infection nor to guide or monitor treatment for MRSA infections. Performed at [facility], [place] [address], [place], [place] [address]   Aerobic Culture (superficial specimen)     Status: None   Collection Time: 11/23/17  1:35 PM  Result Value Ref Range Status   Specimen Description WOUND FOOT LEFT  Final   Special Requests Normal  Final   Gram Stain   Final    MODERATE WBC PRESENT, PREDOMINANTLY PMN MODERATE GRAM POSITIVE RODS FEW GRAM NEGATIVE RODS RARE GRAM POSITIVE COCCI IN PAIRS    Culture   Final    FEW STREPTOCOCCUS GROUP F MODERATE LACTOBACILLUS SPECIES Standardized susceptibility testing for this organism is not available. Performed at [facility], [place] [address], [place], [place] [address]    Report Status 11/27/2017 FINAL  Final  Surgical pcr screen     Status: None   Collection Time: 11/24/17 12:03 AM  Result  Value Ref Range Status   MRSA, PCR NEGATIVE NEGATIVE Final   Staphylococcus aureus NEGATIVE NEGATIVE Final    Comment: (NOTE) The Xpert SA Assay (FDA approved for NASAL specimens in patients 74 years of age and older), is one component of a comprehensive surveillance program. It is not intended to diagnose infection nor to guide or monitor treatment. Performed at [facility], [place] [address], [place], [place] [address]    Assessment: EARVIN BLAZIER is a 37 y.o. man PWID (long standing) that is here with infection involving the bone and soft tissue of feet with osteomyelitis of the left medial malleolus complicated by bacteremia with actinomyces and viridans strep. He is scheduled for debridement tomorrow with Dr. Sharol Given comprising medial malleolar osteo and soft tissue wounds to both feet. With his TEE being negative likely we can reduce IV penicillin duration to 2 weeks with long standing oral regimen (amox-clav vs amoxicillin alone vs doxycycline) for at minimum 6 months (often requires 12 months).   Hep C RNA quant 5,450,000 copies, genotype 1a, treatment naive. No seroimmunity to Hep B --> would recommend providing first Hep B vaccine prior to discharge; we can give 2nd outpatient in clinic. Will check Hep A total Ab to see if he needs this vaccine as well. Would recommend Prevnar with his diabetes history followed by pneumovax 8 weeks later.   Plan:  1. Continue IV penicillin 2. Hep A Ab, Total tomorrow AM 3. Please give Prevnar vaccine prior to discharge.  4. Hep C treatment outpatient  5. Will follow after surgery tomorrow  [name], MSN, NP-C [facility] [place] Cell: [phone] Pager: [phone]  11/28/2017  3:04 PM

## 2017-11-28 NOTE — Anesthesia Procedure Notes (Signed)
Procedure Name: MAC Date/Time: 11/28/2017 8:24 AM Performed by: Colin Benton, CRNA Pre-anesthesia Checklist: Patient identified, Emergency Drugs available, Suction available and Patient being monitored Patient Re-evaluated:Patient Re-evaluated prior to induction Oxygen Delivery Method: Nasal cannula Preoxygenation: Pre-oxygenation with 100% oxygen Induction Type: IV induction Placement Confirmation: positive ETCO2 Dental Injury: Teeth and Oropharynx as per pre-operative assessment

## 2017-11-28 NOTE — Progress Notes (Signed)
PROGRESS NOTE        [name] Age: 37 y.o. Sex: male Date of Birth: Dec 28, 1980 Admit Date: 11/21/2017 Admitting Physician Luke Pour, MD NOM:VEHMCNO, Luke Buff, NP  Brief Narrative: Patient is a 37 y.o. male with history of IVDA (relapse recently) insulin-dependent diabetes, hypertension who presented with sepsis secondary to soft tissue infection of bilateral feet, and osteomyelitis of the medial malleolus.  See below for further details.  Subjective: No chest pain or shortness of breath-lying comfortably in bed.  Assessment/Plan: Sepsis secondary to bilateral lower extremity soft tissue infection with abscesses and osteomyelitis of the left medial malleolus along with actinomyces bacteremia: Sepsis pathophysiology has resolved, continue IV penicillin G.  Orthopedics following-with plans to do diet in the operating room on 9/13, operative debridement on 9/11 was canceled due to hyperglycemia.  TEE on 9/12 did not show any vegetation, there was no vegetation on TTE as well.  Infectious disease following.  Patient aware that he is at risk of limb loss, and that he needs to remain hospitalized-and continue with current care, otherwise he is at significant risk of limb loss.  Infectious disease following and directing antimicrobial therapy.  Given his issues with IV drug use-I do not think he is a good candidate for outpatient IV antimicrobial therapy.  DKA: Resolved with insulin infusion.  Insulin-dependent diabetes: Overall poorly controlled, A1c 12.1.  He refused his insulin yesterday morning causing sugars to be elevated and hence plan debridement was canceled.  CBGs are currently stable with insulin 70/30 55 units twice daily.  He will be n.p.o. post midnight for planned debridement tomorrow-we will plan on giving him half of his morning dose of insulin.  Will follow and optimize accordingly.   Hypertension: BP controlled, continue  lisinopril.  Hypothyroidism: Continue levothyroxine  Peripheral neuropathy: Likely related to diabetes, continue Neurontin   Chronic hepatitis C: We will need outpatient follow-up at the ID clinic.  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  Claims he was clean for 2 years before his most recent relapse.  He has a history of numerous soft tissue infections in his bilateral upper extremity-and apparently has a history of fasciotomy in his left upper extremity.  Counseled again extensively this morning.  DVT Prophylaxis: SCD's-Start pharmacological prophylaxis once all or procedures are complete.  Code Status: Full code   Family Communication: None at bedside  Disposition Plan: Remain inpatient-will require several days of hospitalization.  Antimicrobial agents: Anti-infectives (From admission, onward)   Start     Dose/Rate Route Frequency Ordered Stop   11/27/17 0730  ceFAZolin (ANCEF) IVPB 2g/100 mL premix  Status:  Discontinued     2 g 200 mL/hr over 30 Minutes Intravenous To Short Stay 11/27/17 0716 11/27/17 1317   11/25/17 1400  penicillin G potassium 4 Million Units in dextrose 5 % 250 mL IVPB     4 Million Units 250 mL/hr over 60 Minutes Intravenous Every 4 hours 11/25/17 1254     11/25/17 0800  vancomycin (VANCOCIN) IVPB 1000 mg/200 mL premix  Status:  Discontinued     1,000 mg 200 mL/hr over 60 Minutes Intravenous Every 8 hours 11/25/17 0129 11/25/17 1413   11/24/17 0600  ceFAZolin (ANCEF) IVPB 2g/100 mL premix     2 g 200 mL/hr over 30 Minutes  Intravenous On call to O.R. 11/23/17 2306 11/25/17 0559   11/23/17 1400  piperacillin-tazobactam (ZOSYN) IVPB 3.375 g  Status:  Discontinued     3.375 g 12.5 mL/hr over 240 Minutes Intravenous Every 8 hours 11/23/17 1031 11/25/17 1254   11/23/17 0000  metroNIDAZOLE (FLAGYL) IVPB 500 mg  Status:  Discontinued     500 mg 100 mL/hr over 60 Minutes  Intravenous Every 8 hours 11/22/17 2312 11/23/17 1014   11/22/17 2315  cefTRIAXone (ROCEPHIN) 2 g in sodium chloride 0.9 % 100 mL IVPB  Status:  Discontinued     2 g 200 mL/hr over 30 Minutes Intravenous Every 24 hours 11/22/17 2312 11/23/17 1014   11/22/17 1400  piperacillin-tazobactam (ZOSYN) IVPB 3.375 g  Status:  Discontinued     3.375 g 12.5 mL/hr over 240 Minutes Intravenous Every 8 hours 11/22/17 1043 11/22/17 2311   11/22/17 1045  vancomycin (VANCOCIN) IVPB 750 mg/150 ml premix  Status:  Discontinued     750 mg 150 mL/hr over 60 Minutes Intravenous Every 12 hours 11/22/17 1043 11/25/17 0129   11/21/17 1830  vancomycin (VANCOCIN) IVPB 750 mg/150 ml premix     750 mg 150 mL/hr over 60 Minutes Intravenous  Once 11/21/17 1816 11/21/17 2030   11/21/17 1715  vancomycin (VANCOCIN) IVPB 1000 mg/200 mL premix     1,000 mg 200 mL/hr over 60 Minutes Intravenous  Once 11/21/17 1710 11/21/17 1914   11/21/17 1715  piperacillin-tazobactam (ZOSYN) IVPB 3.375 g     3.375 g 12.5 mL/hr over 240 Minutes Intravenous  Once 11/21/17 1710 11/21/17 2030      Procedures: 9/12>> TEE No vegetations are seen. There is 1-2+ central aortic insufficiency.  Mild LVH. Chiari network (normal variant) is present.  9/9>>TTE: - Left ventricle: There was mild concentric hypertrophy. Systolic   function was normal. The estimated ejection fraction was in the   range of 60% to 65%. Left ventricular diastolic function   parameters were normal. - Aortic valve: Transvalvular velocity was within the normal range.   There was no stenosis. There was mild regurgitation. Valve area   (VTI): 2.83 cm^2. Valve area (Vmax): 2.61 cm^2. Valve area   (Vmean): 2.72 cm^2. Regurgitation pressure half-time: 944 ms. - Left atrium: The atrium was mildly dilated. - Atrial septum: No defect or patent foramen ovale was identified   by color flow Doppler. - Tricuspid valve: There was mild regurgitation. - Pulmonic valve: There was  moderate regurgitation. - Pulmonary arteries: Systolic pressure was within the normal   range. PA peak pressure: 30 mm Hg (S). - Pericardium, extracardiac: A trivial pericardial effusion was   identified.  CONSULTS:  ID and orthopedic surgery  Time spent: 25- minutes-Greater than 50% of this time was spent in counseling, explanation of diagnosis, planning of further management, and coordination of care.  MEDICATIONS: Scheduled Meds: . famotidine  20 mg Oral BID  . gabapentin  400 mg Oral TID  . hepatitis b vaccine for adults  1 mL Intramuscular Once  . insulin aspart protamine- aspart  55 Units Subcutaneous BID WC  . levothyroxine  50 mcg Oral QAC breakfast  . lisinopril  10 mg Oral Daily  . sodium chloride flush  3 mL Intravenous Q12H  . traZODone  50 mg Oral QHS   Continuous Infusions: . lactated ringers    . pencillin G potassium IV 4 Million Units (11/28/17 1026)   PRN Meds:.acetaminophen **OR** acetaminophen, dextrose, heparin lock flush, hydrALAZINE, ondansetron **OR** ondansetron (ZOFRAN) IV, sodium chloride  flush, sodium chloride flush, traMADol   PHYSICAL EXAM: Vital signs: Vitals:   11/28/17 0729 11/28/17 0844 11/28/17 0854 11/28/17 0929  BP: (!) 146/95 104/64 109/65 126/85  Pulse: 89 93 84 80  Resp: 12 14 17 18   Temp: 98.7 F (37.1 C) 98.6 F (37 C)  98.1 F (36.7 C)  TempSrc: Oral Oral  Oral  SpO2: 97% 98% 99% 99%  Weight: 86.8 kg     Height: 5\' 9"  (1.753 m)      Filed Weights   11/23/17 0227 11/28/17 0729  Weight: 86.8 kg 86.8 kg   Body mass index is 28.26 kg/m.   General appearance:Awake, alert, not in any distress.  Eyes:no scleral icterus. HEENT: Atraumatic and Normocephalic Neck: supple, no JVD. Resp:Good air entry bilaterally,no rales or rhonchi CVS: S1 S2 regular, no murmurs.  GI: Bowel sounds present, Non tender and not distended with no gaurding, rigidity or rebound. Extremities: B/L Lower Ext shows no edema Neurology:  Non  focal Psychiatric: Normal judgment and insight. Normal mood. Musculoskeletal:No digital cyanosis Skin:No Rash, warm and dry Wounds:N/A  I have personally reviewed following labs and imaging studies  LABORATORY DATA: CBC: Recent Labs  Lab 11/21/17 1332 11/22/17 1750 11/23/17 0410 11/24/17 0347 11/25/17 0319 11/26/17 0445  WBC 24.1* 19.5* 16.9* 16.8* 15.8* 15.4*  NEUTROABS 18.0*  --   --   --   --  8.9*  HGB 11.9* 9.3* 9.4* 9.4* 9.6* 10.4*  HCT 36.1* 28.9* 28.4* 29.2* 29.4* 32.4*  MCV 83.8 83.5 82.1 83.0 83.1 82.9  PLT 560* 453* 426* 474* 504* 576*    Basic Metabolic Panel: Recent Labs  Lab 11/21/17 1332 11/22/17 1750 11/23/17 0410 11/24/17 0347 11/25/17 0319 11/25/17 1534 11/26/17 0445 11/27/17 0312 11/28/17 0500  NA 131* 138 135 137  --  138  --   --   --   K 4.5 3.6 4.5 4.0  --  4.0  --   --   --   CL 95* 107 107 107  --  106  --   --   --   CO2 19* 23 20* 23  --  23  --   --   --   GLUCOSE 682* 169* 400* 246*  --  241*  --   --   --   BUN 19 11 10 11   --  8  --   --   --   CREATININE 1.95* 1.16 1.31* 1.16 1.15 1.19 1.14 1.24 1.26*  CALCIUM 8.8* 8.2* 8.2* 8.2*  --  8.4*  --   --   --     GFR: Estimated Creatinine Clearance: 87.5 mL/min (A) (by C-G formula based on SCr of 1.26 mg/dL (H)).  Liver Function Tests: Recent Labs  Lab 11/21/17 1332 11/22/17 1750 11/23/17 0410  AST 12* 15 16  ALT 14 11 11   ALKPHOS 128* 94 88  BILITOT 1.2 0.7 0.4  PROT 6.9 5.5* 5.5*  ALBUMIN 2.9* 2.2* 2.1*   No results for input(s): LIPASE, AMYLASE in the last 168 hours. No results for input(s): AMMONIA in the last 168 hours.  Coagulation Profile: No results for input(s): INR, PROTIME in the last 168 hours.  Cardiac Enzymes: No results for input(s): CKTOTAL, CKMB, CKMBINDEX, TROPONINI in the last 168 hours.  BNP (last 3 results) No results for input(s): PROBNP in the last 8760 hours.  HbA1C: No results for input(s): HGBA1C in the last 72 hours.  CBG: Recent Labs   Lab 11/27/17 1636 11/27/17 2203 11/28/17  0534 11/28/17 0733 11/28/17 0929  GLUCAP 373* 333* 80 130* 175*    Lipid Profile: No results for input(s): CHOL, HDL, LDLCALC, TRIG, CHOLHDL, LDLDIRECT in the last 72 hours.  Thyroid Function Tests: No results for input(s): TSH, T4TOTAL, FREET4, T3FREE, THYROIDAB in the last 72 hours.  Anemia Panel: No results for input(s): VITAMINB12, FOLATE, FERRITIN, TIBC, IRON, RETICCTPCT in the last 72 hours.  Urine analysis:    Component Value Date/Time   COLORURINE STRAW (A) 11/21/2017 1320   APPEARANCEUR CLEAR 11/21/2017 1320   APPEARANCEUR Clear 04/11/2014 0138   LABSPEC 1.027 11/21/2017 1320   LABSPEC 1.026 04/11/2014 0138   PHURINE 6.0 11/21/2017 1320   GLUCOSEU >=500 (A) 11/21/2017 1320   GLUCOSEU >=500 04/11/2014 0138   HGBUR MODERATE (A) 11/21/2017 1320   BILIRUBINUR NEGATIVE 11/21/2017 1320   BILIRUBINUR neg 02/18/2017 0946   BILIRUBINUR Negative 04/11/2014 0138   KETONESUR 20 (A) 11/21/2017 1320   PROTEINUR 30 (A) 11/21/2017 1320   UROBILINOGEN 0.2 02/18/2017 0946   UROBILINOGEN 0.2 12/10/2013 1221   NITRITE NEGATIVE 11/21/2017 1320   LEUKOCYTESUR NEGATIVE 11/21/2017 1320   LEUKOCYTESUR Negative 04/11/2014 0138    Sepsis Labs: Lactic Acid, Venous    Component Value Date/Time   LATICACIDVEN 1.77 11/21/2017 1957    MICROBIOLOGY: Recent Results (from the past 240 hour(s))  Blood Culture (routine x 2)     Status: Abnormal   Collection Time: 11/21/17  1:30 PM  Result Value Ref Range Status   Specimen Description BLOOD RIGHT HAND  Final   Special Requests   Final    BOTTLES DRAWN AEROBIC AND ANAEROBIC Blood Culture results may not be optimal due to an excessive volume of blood received in culture bottles   Culture  Setup Time   Final    GRAM POSITIVE COCCI AEROBIC BOTTLE ONLY CRITICAL RESULT CALLED TO, READ BACK BY AND VERIFIED WITH: PHARMD L SEAY 11/22/17 AT 2226 BY CM GRAM POSITIVE RODS ANAEROBIC BOTTLE ONLY CRITICAL  RESULT CALLED TO, READ BACK BY AND VERIFIED WITH: D PEARCE,PHARMD AT 1710 11/23/17 BY L BENFIELD    Culture (A)  Final    VIRIDANS STREPTOCOCCUS THE SIGNIFICANCE OF ISOLATING THIS ORGANISM FROM A SINGLE SET OF BLOOD CULTURES WHEN MULTIPLE SETS ARE DRAWN IS UNCERTAIN. PLEASE NOTIFY THE MICROBIOLOGY DEPARTMENT WITHIN ONE WEEK IF SPECIATION AND SENSITIVITIES ARE REQUIRED. ACTINOMYCES NAESLUNDII Standardized susceptibility testing for this organism is not available. Performed at [facility], [place] [address] [place],  [address]    Report Status 11/25/2017 FINAL  Final  Blood Culture ID Panel (Reflexed)     Status: Abnormal   Collection Time: 11/21/17  1:30 PM  Result Value Ref Range Status   Enterococcus species NOT DETECTED NOT DETECTED Final   Listeria monocytogenes NOT DETECTED NOT DETECTED Final   Staphylococcus species NOT DETECTED NOT DETECTED Final   Staphylococcus aureus NOT DETECTED NOT DETECTED Final   Streptococcus species DETECTED (A) NOT DETECTED Final    Comment: Not Enterococcus species, Streptococcus agalactiae, Streptococcus pyogenes, or Streptococcus pneumoniae. CRITICAL RESULT CALLED TO, READ BACK BY AND VERIFIED WITH: PHARMD L SEAY 11/22/17 AT 2227 BY CM    Streptococcus agalactiae NOT DETECTED NOT DETECTED Final   Streptococcus pneumoniae NOT DETECTED NOT DETECTED Final   Streptococcus pyogenes NOT DETECTED NOT DETECTED Final   Acinetobacter baumannii NOT DETECTED NOT DETECTED Final   Enterobacteriaceae species NOT DETECTED NOT DETECTED Final   Enterobacter cloacae complex NOT DETECTED NOT DETECTED Final   Escherichia coli NOT DETECTED  NOT DETECTED Final   Klebsiella oxytoca NOT DETECTED NOT DETECTED Final   Klebsiella pneumoniae NOT DETECTED NOT DETECTED Final   Proteus species NOT DETECTED NOT DETECTED Final   Serratia marcescens NOT DETECTED NOT DETECTED Final   Haemophilus influenzae NOT DETECTED NOT DETECTED Final   Neisseria meningitidis NOT DETECTED  NOT DETECTED Final   Pseudomonas aeruginosa NOT DETECTED NOT DETECTED Final   Candida albicans NOT DETECTED NOT DETECTED Final   Candida glabrata NOT DETECTED NOT DETECTED Final   Candida krusei NOT DETECTED NOT DETECTED Final   Candida parapsilosis NOT DETECTED NOT DETECTED Final   Candida tropicalis NOT DETECTED NOT DETECTED Final    Comment: Performed at [facility], [place] [address] [place], [place] [address]  Blood Culture (routine x 2)     Status: None   Collection Time: 11/21/17  8:10 PM  Result Value Ref Range Status   Specimen Description BLOOD SITE NOT SPECIFIED  Final   Special Requests   Final    BOTTLES DRAWN AEROBIC ONLY Blood Culture results may not be optimal due to an inadequate volume of blood received in culture bottles   Culture   Final    NO GROWTH 5 DAYS Performed at [facility], [place] [address], [place], [place] [address]    Report Status 11/26/2017 FINAL  Final  MRSA PCR Screening     Status: None   Collection Time: 11/21/17  8:19 PM  Result Value Ref Range Status   MRSA by PCR NEGATIVE NEGATIVE Final    Comment:        The GeneXpert MRSA Assay (FDA approved for NASAL specimens only), is one component of a comprehensive MRSA colonization surveillance program. It is not intended to diagnose MRSA infection nor to guide or monitor treatment for MRSA infections. Performed at [facility], [place] [address] [place], [place] [address]   Aerobic Culture (superficial specimen)     Status: None   Collection Time: 11/23/17  1:35 PM  Result Value Ref Range Status   Specimen Description WOUND FOOT LEFT  Final   Special Requests Normal  Final   Gram Stain   Final    MODERATE WBC PRESENT, PREDOMINANTLY PMN MODERATE GRAM POSITIVE RODS FEW GRAM NEGATIVE RODS RARE GRAM POSITIVE COCCI IN PAIRS    Culture   Final    FEW STREPTOCOCCUS GROUP F MODERATE LACTOBACILLUS SPECIES Standardized susceptibility testing for this organism is not  available. Performed at [facility], [place] [address], [place], [place] [address]    Report Status 11/27/2017 FINAL  Final  Surgical pcr screen     Status: None   Collection Time: 11/24/17 12:03 AM  Result Value Ref Range Status   MRSA, PCR NEGATIVE NEGATIVE Final   Staphylococcus aureus NEGATIVE NEGATIVE Final    Comment: (NOTE) The Xpert SA Assay (FDA approved for NASAL specimens in patients 27 years of age and older), is one component of a comprehensive surveillance program. It is not intended to diagnose infection nor to guide or monitor treatment. Performed at [facility], [place] [address], [place],  [address]     RADIOLOGY STUDIES/RESULTS: Mr Foot Left W Wo Contrast  Result Date: 11/22/2017 CLINICAL DATA:  IV drug abuser who injects in his feet. Bilateral foot wounds for 1 week. Chills and malaise. EXAM: MRI OF THE LEFT FOREFOOT WITHOUT AND WITH CONTRAST TECHNIQUE: Multiplanar, multisequence MR imaging of the left forefoot was performed both before and after administration  of intravenous contrast. CONTRAST:  8.5 cc Gadavist IV COMPARISON:  Plain films left foot 11/21/2017. FINDINGS: Bones/Joint/Cartilage There is partial visualization of marrow edema in the medial malleolus with postcontrast enhancement. Bone marrow signal is otherwise normal. Ligaments Intact Muscles and Tendons No intramuscular fluid collection is identified. Soft tissues Diffuse subcutaneous edema enhancement are seen about the foot. There is an abscess in the dorsal subcutaneous tissues centered over the bases of the first through third metatarsals. The abscess measures 1.8 cm craniocaudal by 4.6 cm transverse by 4.4 cm long. A second abscess in the subcutaneous tissues measuring 2.5 cm long by 2.3 cm craniocaudal by 0.9 cm transverse is identified along the proximal first metatarsal. This abscess is centered 1.5 cm distal to the first tarsometatarsal joint. A third abscess which is incompletely  visualized is seen in subcutaneous tissues 3.2 cm superior to the tip of the medial malleolus. This collection measures approximately 1.5 cm craniocaudal by 0.2 cm transverse. IMPRESSION: Three subcutaneous abscesses are identified as described above. The largest collection is in the dorsal soft tissues at the level of the proximal first and third metatarsals. Edema and enhancement the medial malleolus consistent with osteomyelitis. Electronically Signed   By: Inge Rise M.D.   On: 11/22/2017 13:01   Dg Foot Complete Left  Result Date: 11/21/2017 CLINICAL DATA:  Bilateral foot pain for 4-5 days, left-greater-than-right, swelling and open sores, the patient is diabetic, and has a smoking history EXAM: LEFT FOOT - COMPLETE 3+ VIEW COMPARISON:  None. FINDINGS: There is soft tissue swelling over the dorsum of the left foot. However no underlying focal demineralization or erosion is seen to indicate osteomyelitis. No acute abnormality is noted. Joint spaces appear normal. No erosion is seen. IMPRESSION: Soft tissue swelling over the dorsum of the foot. No underlying bony abnormality. Electronically Signed   By: Ivar Drape M.D.   On: 11/21/2017 15:30   Dg Foot Complete Right  Result Date: 11/21/2017 CLINICAL DATA:  Bilateral foot pain for 4-5 days, left-greater-than-right with swelling and redness, the patient is diabetic and does have a smoking history EXAM: RIGHT FOOT COMPLETE - 3+ VIEW COMPARISON:  None FINDINGS: There is mild soft tissue swelling over the dorsum of the right foot. No underlying bony demineralization or erosion is seen to indicate active osteomyelitis. Alignment is normal and joint spaces appear normal. IMPRESSION: Soft tissue swelling over the dorsum. No evidence of active osteomyelitis. Electronically Signed   By: Ivar Drape M.D.   On: 11/21/2017 15:32   [place] Ekg Site Rite  Result Date: 11/22/2017 If Site Rite image not attached, placement could not be confirmed due to current cardiac  rhythm.    LOS: 7 days   Oren Binet, MD  [facility]  If 7PM-7AM, please contact night-coverage  Please page via [email or web address] [email or web address]-click on MD name and type text message  11/28/2017, 11:32 AM

## 2017-11-28 NOTE — Progress Notes (Signed)
Patient ID: Luke [name], male   DOB: Dec 23, 1980, 37 y.o.   MRN: [id number] Patient surgery was canceled yesterday by anesthesia due to high blood sugars.  Patient is scheduled for first case tomorrow morning at 730.  Patient states that he has a TEE today.

## 2017-11-28 NOTE — Transfer of Care (Signed)
Immediate Anesthesia Transfer of Care Note  [name]  Procedure(s) Performed: TRANSESOPHAGEAL ECHOCARDIOGRAM (TEE) (N/A )  Patient Location: Endoscopy Unit  Anesthesia Type:MAC  Level of Consciousness: drowsy  Airway & Oxygen Therapy: Patient Spontanous Breathing and Patient connected to nasal cannula oxygen  Post-op Assessment: Report given to RN and Post -op Vital signs reviewed and stable  Post vital signs: Reviewed and stable  Last Vitals:  Vitals Value Taken Time  BP 104/64 11/28/2017  8:44 AM  Temp 37 C 11/28/2017  8:44 AM  Pulse 83 11/28/2017  8:45 AM  Resp 21 11/28/2017  8:45 AM  SpO2 98 % 11/28/2017  8:45 AM  Vitals shown include unvalidated device data.  Last Pain:  Vitals:   11/28/17 0844  TempSrc: Oral  PainSc:       Patients Stated Pain Goal: 2 (64/33/29 5188)  Complications: No apparent anesthesia complications

## 2017-11-29 ENCOUNTER — Encounter (HOSPITAL_COMMUNITY)
Admission: EM | Disposition: A | Discharge status: Home / Self Care | Payer: Self-pay | Source: Home / Self Care | Attending: Internal Medicine

## 2017-11-29 ENCOUNTER — Encounter (HOSPITAL_COMMUNITY): Payer: Self-pay | Admitting: Certified Registered"

## 2017-11-29 ENCOUNTER — Inpatient Hospital Stay (HOSPITAL_COMMUNITY): Payer: Medicaid Other | Admitting: Anesthesiology

## 2017-11-29 DIAGNOSIS — Z978 Presence of other specified devices: Secondary | ICD-10-CM

## 2017-11-29 DIAGNOSIS — M86072 Acute hematogenous osteomyelitis, left ankle and foot: Secondary | ICD-10-CM

## 2017-11-29 DIAGNOSIS — M86071 Acute hematogenous osteomyelitis, right ankle and foot: Secondary | ICD-10-CM

## 2017-11-29 HISTORY — PX: INCISION AND DRAINAGE OF WOUND: SHX1803

## 2017-11-29 LAB — HEPATITIS A ANTIBODY, TOTAL: HEP A TOTAL AB: NEGATIVE

## 2017-11-29 LAB — CREATININE, SERUM
CREATININE: 1.31 mg/dL — AB (ref 0.61–1.24)
GFR calc non Af Amer: >60 mL/min (ref >60)

## 2017-11-29 LAB — GLUCOSE, CAPILLARY
GLUCOSE-CAPILLARY: 167 mg/dL — AB (ref 70–99)
GLUCOSE-CAPILLARY: 298 mg/dL — AB (ref 70–99)
GLUCOSE-CAPILLARY: 135 mg/dL — AB (ref 70–99)

## 2017-11-29 SURGERY — IRRIGATION AND DEBRIDEMENT WOUND
Anesthesia: General | Site: Foot | Laterality: Bilateral

## 2017-11-29 MED ORDER — OXYCODONE HCL 5 MG PO TABS
10.0000 mg | ORAL_TABLET | ORAL | Status: DC | PRN
  Administered 2017-11-29: 10 mg via ORAL
  Administered 2017-11-29 – 2017-11-30 (×4): 15 mg via ORAL
  Administered 2017-12-01: 10 mg via ORAL
  Filled 2017-11-29: qty 3
  Filled 2017-11-29 (×2): qty 2
  Filled 2017-11-29 (×3): qty 3

## 2017-11-29 MED ORDER — ACETAMINOPHEN 325 MG PO TABS
325.0000 mg | ORAL_TABLET | Freq: Four times a day (QID) | ORAL | Status: DC | PRN
  Administered 2017-12-06: 650 mg via ORAL
  Filled 2017-11-29 (×2): qty 2

## 2017-11-29 MED ORDER — ONDANSETRON HCL 4 MG/2ML IJ SOLN
INTRAMUSCULAR | Status: DC | PRN
  Administered 2017-11-29: 4 mg via INTRAVENOUS

## 2017-11-29 MED ORDER — LACTATED RINGERS IV SOLN
INTRAVENOUS | Status: DC | PRN
  Administered 2017-11-29: 07:00:00 via INTRAVENOUS

## 2017-11-29 MED ORDER — HYDROMORPHONE HCL 1 MG/ML IJ SOLN
0.5000 mg | INTRAMUSCULAR | Status: DC | PRN
  Administered 2017-11-29 – 2017-11-30 (×6): 1 mg via INTRAVENOUS
  Administered 2017-12-01: 0.5 mg via INTRAVENOUS
  Administered 2017-12-01 (×2): 1 mg via INTRAVENOUS
  Administered 2017-12-01: 0.5 mg via INTRAVENOUS
  Administered 2017-12-02 – 2017-12-03 (×8): 1 mg via INTRAVENOUS
  Administered 2017-12-03: 0.5 mg via INTRAVENOUS
  Administered 2017-12-03 – 2017-12-06 (×15): 1 mg via INTRAVENOUS
  Administered 2017-12-06: 0.5 mg via INTRAVENOUS
  Administered 2017-12-06 – 2017-12-08 (×10): 1 mg via INTRAVENOUS
  Filled 2017-11-29 (×21): qty 1
  Filled 2017-11-29: qty 0.5
  Filled 2017-11-29: qty 1
  Filled 2017-11-29: qty 0.5
  Filled 2017-11-29 (×22): qty 1

## 2017-11-29 MED ORDER — 0.9 % SODIUM CHLORIDE (POUR BTL) OPTIME
TOPICAL | Status: DC | PRN
  Administered 2017-11-29: 1000 mL

## 2017-11-29 MED ORDER — PROPOFOL 10 MG/ML IV BOLUS
INTRAVENOUS | Status: DC | PRN
  Administered 2017-11-29: 200 mg via INTRAVENOUS

## 2017-11-29 MED ORDER — MEPERIDINE HCL 50 MG/ML IJ SOLN: 6.2500 mg | INTRAMUSCULAR | Status: DC | PRN

## 2017-11-29 MED ORDER — LIDOCAINE 2% (20 MG/ML) 5 ML SYRINGE
INTRAMUSCULAR | Status: AC
  Filled 2017-11-29: qty 5

## 2017-11-29 MED ORDER — METOCLOPRAMIDE HCL 5 MG/ML IJ SOLN: 5.0000 mg | Freq: Three times a day (TID) | INTRAMUSCULAR | Status: DC | PRN

## 2017-11-29 MED ORDER — ONDANSETRON HCL 4 MG/2ML IJ SOLN
INTRAMUSCULAR | Status: AC
  Filled 2017-11-29: qty 2

## 2017-11-29 MED ORDER — OXYCODONE HCL 5 MG PO TABS: 5.0000 mg | ORAL_TABLET | Freq: Once | ORAL | Status: DC | PRN

## 2017-11-29 MED ORDER — METOCLOPRAMIDE HCL 10 MG PO TABS: 5.0000 mg | ORAL_TABLET | Freq: Three times a day (TID) | ORAL | Status: DC | PRN

## 2017-11-29 MED ORDER — OXYCODONE HCL 5 MG/5ML PO SOLN: 5.0000 mg | Freq: Once | ORAL | Status: DC | PRN

## 2017-11-29 MED ORDER — PROMETHAZINE HCL 25 MG/ML IJ SOLN: 6.2500 mg | INTRAMUSCULAR | Status: DC | PRN

## 2017-11-29 MED ORDER — HYDROMORPHONE HCL 1 MG/ML IJ SOLN
INTRAMUSCULAR | Status: AC
  Administered 2017-11-29: 09:00:00
  Filled 2017-11-29: qty 1

## 2017-11-29 MED ORDER — PROPOFOL 10 MG/ML IV BOLUS
INTRAVENOUS | Status: AC
  Filled 2017-11-29: qty 20

## 2017-11-29 MED ORDER — MAGNESIUM CITRATE PO SOLN: 1.0000 | Freq: Once | ORAL | Status: DC | PRN

## 2017-11-29 MED ORDER — KETAMINE HCL 50 MG/5ML IJ SOSY
PREFILLED_SYRINGE | INTRAMUSCULAR | Status: AC
  Filled 2017-11-29: qty 5

## 2017-11-29 MED ORDER — MIDAZOLAM HCL 2 MG/2ML IJ SOLN
INTRAMUSCULAR | Status: AC
  Filled 2017-11-29: qty 2

## 2017-11-29 MED ORDER — METHOCARBAMOL 1000 MG/10ML IJ SOLN
500.0000 mg | Freq: Four times a day (QID) | INTRAVENOUS | Status: DC | PRN
  Filled 2017-11-29: qty 5

## 2017-11-29 MED ORDER — POLYETHYLENE GLYCOL 3350 17 G PO PACK: 17.0000 g | PACK | Freq: Every day | ORAL | Status: DC | PRN

## 2017-11-29 MED ORDER — DEXMEDETOMIDINE HCL 200 MCG/2ML IV SOLN
INTRAVENOUS | Status: DC | PRN
  Administered 2017-11-29 (×5): 4 ug via INTRAVENOUS

## 2017-11-29 MED ORDER — INSULIN ASPART PROT & ASPART (70-30 MIX) 100 UNIT/ML ~~LOC~~ SUSP
62.0000 [IU] | Freq: Every day | SUBCUTANEOUS | Status: DC
  Administered 2017-11-29 – 2017-12-01 (×3): 62 [IU] via SUBCUTANEOUS

## 2017-11-29 MED ORDER — HYDROMORPHONE HCL 1 MG/ML IJ SOLN
0.2500 mg | INTRAMUSCULAR | Status: DC | PRN
  Administered 2017-11-29: 1 mg via INTRAVENOUS

## 2017-11-29 MED ORDER — METHOCARBAMOL 500 MG PO TABS: 500.0000 mg | ORAL_TABLET | Freq: Four times a day (QID) | ORAL | Status: DC | PRN

## 2017-11-29 MED ORDER — OXYCODONE HCL 5 MG PO TABS
5.0000 mg | ORAL_TABLET | ORAL | Status: DC | PRN
  Administered 2017-12-01 – 2017-12-03 (×5): 10 mg via ORAL
  Administered 2017-12-05: 5 mg via ORAL
  Administered 2017-12-06 – 2017-12-08 (×7): 10 mg via ORAL
  Filled 2017-11-29: qty 1
  Filled 2017-11-29 (×3): qty 2
  Filled 2017-11-29: qty 1
  Filled 2017-11-29 (×9): qty 2
  Filled 2017-11-29: qty 1
  Filled 2017-11-29: qty 2

## 2017-11-29 MED ORDER — SODIUM CHLORIDE 0.9 % IV SOLN
INTRAVENOUS | Status: AC
  Administered 2017-11-29: 10:00:00 via INTRAVENOUS

## 2017-11-29 MED ORDER — DOCUSATE SODIUM 100 MG PO CAPS
100.0000 mg | ORAL_CAPSULE | Freq: Two times a day (BID) | ORAL | Status: DC
  Administered 2017-11-30 – 2017-12-11 (×3): 100 mg via ORAL
  Filled 2017-11-29 (×18): qty 1

## 2017-11-29 MED ORDER — FENTANYL CITRATE (PF) 250 MCG/5ML IJ SOLN
INTRAMUSCULAR | Status: AC
  Filled 2017-11-29: qty 5

## 2017-11-29 MED ORDER — INSULIN NPH (HUMAN) (ISOPHANE) 100 UNIT/ML ~~LOC~~ SUSP
18.0000 [IU] | SUBCUTANEOUS | Status: DC
  Filled 2017-11-29: qty 10

## 2017-11-29 MED ORDER — FENTANYL CITRATE (PF) 100 MCG/2ML IJ SOLN
INTRAMUSCULAR | Status: DC | PRN
  Administered 2017-11-29 (×2): 50 ug via INTRAVENOUS
  Administered 2017-11-29: 100 ug via INTRAVENOUS
  Administered 2017-11-29: 50 ug via INTRAVENOUS

## 2017-11-29 MED ORDER — BISACODYL 10 MG RE SUPP: 10.0000 mg | Freq: Every day | RECTAL | Status: DC | PRN

## 2017-11-29 MED ORDER — INSULIN ASPART PROT & ASPART (70-30 MIX) 100 UNIT/ML ~~LOC~~ SUSP
55.0000 [IU] | Freq: Every day | SUBCUTANEOUS | Status: DC
  Administered 2017-11-29: 55 [IU] via SUBCUTANEOUS

## 2017-11-29 MED ORDER — MIDAZOLAM HCL 5 MG/5ML IJ SOLN
INTRAMUSCULAR | Status: DC | PRN
  Administered 2017-11-29: 2 mg via INTRAVENOUS

## 2017-11-29 MED ORDER — ONDANSETRON HCL 4 MG PO TABS: 4.0000 mg | ORAL_TABLET | Freq: Four times a day (QID) | ORAL | Status: DC | PRN

## 2017-11-29 MED ORDER — INSULIN ASPART PROT & ASPART (70-30 MIX) 100 UNIT/ML ~~LOC~~ SUSP: 55.0000 [IU] | Freq: Two times a day (BID) | SUBCUTANEOUS | Status: DC

## 2017-11-29 MED ORDER — ONDANSETRON HCL 4 MG/2ML IJ SOLN: 4.0000 mg | Freq: Four times a day (QID) | INTRAMUSCULAR | Status: DC | PRN

## 2017-11-29 SURGICAL SUPPLY — 31 items
BLADE SURG 21 STRL SS (BLADE) ×4 IMPLANT
CANISTER WOUND CARE 500ML ATS (WOUND CARE) ×4 IMPLANT
COVER SURGICAL LIGHT HANDLE (MISCELLANEOUS) ×4 IMPLANT
DRAPE U-SHAPE 47X51 STRL (DRAPES) ×2 IMPLANT
DRESSING PREVENA PLUS CUSTOM (GAUZE/BANDAGES/DRESSINGS) ×1 IMPLANT
DRSG PREVENA PLUS CUSTOM (GAUZE/BANDAGES/DRESSINGS) ×2
DURAPREP 26ML APPLICATOR (WOUND CARE) ×2 IMPLANT
ELECT REM PT RETURN 9FT ADLT (ELECTROSURGICAL)
ELECTRODE REM PT RTRN 9FT ADLT (ELECTROSURGICAL) IMPLANT
GLOVE BIOGEL PI IND STRL 9 (GLOVE) ×1 IMPLANT
GLOVE BIOGEL PI INDICATOR 9 (GLOVE) ×1
GLOVE INDICATOR 8.5 STRL (GLOVE) ×2 IMPLANT
GLOVE SURG ORTHO 9.0 STRL STRW (GLOVE) ×2 IMPLANT
GOWN STRL REUS W/ TWL XL LVL3 (GOWN DISPOSABLE) ×2 IMPLANT
GOWN STRL REUS W/TWL XL LVL3 (GOWN DISPOSABLE) ×2
HANDPIECE INTERPULSE COAX TIP (DISPOSABLE)
KIT BASIN OR (CUSTOM PROCEDURE TRAY) ×2 IMPLANT
KIT TURNOVER KIT B (KITS) ×2 IMPLANT
MANIFOLD NEPTUNE II (INSTRUMENTS) ×2 IMPLANT
NS IRRIG 1000ML POUR BTL (IV SOLUTION) ×2 IMPLANT
PACK ORTHO EXTREMITY (CUSTOM PROCEDURE TRAY) ×2 IMPLANT
PAD ARMBOARD 7.5X6 YLW CONV (MISCELLANEOUS) ×4 IMPLANT
PAD NEG PRESSURE SENSATRAC (MISCELLANEOUS) ×4 IMPLANT
SET HNDPC FAN SPRY TIP SCT (DISPOSABLE) IMPLANT
STOCKINETTE IMPERVIOUS 9X36 MD (GAUZE/BANDAGES/DRESSINGS) ×4 IMPLANT
SUT ETHILON 2 0 PSLX (SUTURE) ×8 IMPLANT
SWAB COLLECTION DEVICE MRSA (MISCELLANEOUS) ×2 IMPLANT
SWAB CULTURE ESWAB REG 1ML (MISCELLANEOUS) IMPLANT
TOWEL OR 17X26 10 PK STRL BLUE (TOWEL DISPOSABLE) ×2 IMPLANT
TUBE CONNECTING 12X1/4 (SUCTIONS) ×2 IMPLANT
YANKAUER SUCT BULB TIP NO VENT (SUCTIONS) ×2 IMPLANT

## 2017-11-29 NOTE — Evaluation (Signed)
Physical Therapy Evaluation [name] MRN: [id number] DOB: 1981/02/22 Today's Date: 11/29/2017   History of Present Illness  Pt is a 37 y/o male admitted secondary to sepsis and abscesses on BLE. 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 Pt is s/p bilateral foot debridement with wound vac placement. Pt is also s/p TEE on 9/12 which did not show any vegetation. PMH includes IV drug abuse, DM, HTN, and seizures.   Clinical Impression  Pt admitted secondary to problem above with deficits below. Pt requiring supervision to stand, however, pt with increased dizziness in standing. Had pt returned to sitting, and dizziness did not improve, therefore had pt returned to supine. Checked BP in supine and pt's BP at 109/64 mmHg. Will continue to follow acutely to maximize functional mobility independence and safety.     Follow Up Recommendations Supervision for mobility/OOB;Other (comment)(TBD pending progress )    Equipment Recommendations  None recommended by PT    Recommendations for Other Services       Precautions / Restrictions Precautions Precautions: Fall Required Braces or Orthoses: Other Brace/Splint Other Brace/Splint: per nursing orders post op shoes, however, not mentioned in note.  Restrictions Weight Bearing Restrictions: Yes RLE Weight Bearing: Weight bearing as tolerated LLE Weight Bearing: Weight bearing as tolerated      Mobility  Bed Mobility Overal bed mobility: Needs Assistance Bed Mobility: Supine to Sit;Sit to Supine     Supine to sit: Supervision Sit to supine: Supervision   General bed mobility comments: Supervision for line management.   Transfers Overall transfer level: Needs assistance Equipment used: Rolling walker (2 wheeled) Transfers: Sit to/from Stand Sit to Stand: Supervision         General transfer comment: Supervision for safety. Pt with increased dizziness in standing, so had pt return to sitting. Dizziness  did not improve with seated rest, so returned to supine. Checked BP in supine and pt's BP at 109/64 mmHg.   Ambulation/Gait             General Gait Details: Unable secondary to dizziness.   Stairs            Wheelchair Mobility    Modified Rankin (Stroke Patients Only)       Balance Overall balance assessment: Needs assistance Sitting-balance support: No upper extremity supported;Feet supported Sitting balance-Leahy Scale: Good     Standing balance support: Bilateral upper extremity supported;During functional activity Standing balance-Leahy Scale: Poor Standing balance comment: Reliant on BUE support                              Pertinent Vitals/Pain Pain Assessment: 0-10 Pain Score: 10-Worst pain ever Pain Location: bilat feet (L>R) Pain Descriptors / Indicators: Aching Pain Intervention(s): Limited activity within patient's tolerance;Monitored during session;Repositioned    Home Living Family/patient expects to be discharged to:: Private residence Living Arrangements: Spouse/significant other;Children Available Help at Discharge: Family;Available 24 hours/day Type of Home: House Home Access: Level entry     Home Layout: Two level;Able to live on main level with bedroom/bathroom Home Equipment: Gilford Rile - 2 wheels      Prior Function Level of Independence: Independent               Hand Dominance   Dominant Hand: Right    Extremity/Trunk Assessment   Upper Extremity Assessment Upper Extremity Assessment: Overall WFL for tasks assessed    Lower Extremity Assessment Lower Extremity Assessment: RLE deficits/detail;LLE  deficits/detail RLE Deficits / Details: s/p debridment of foot abscess LLE Deficits / Details: s/p debridment of foot abscess    Cervical / Trunk Assessment Cervical / Trunk Assessment: Normal  Communication   Communication: No difficulties  Cognition Arousal/Alertness: Awake/alert Behavior During Therapy:  WFL for tasks assessed/performed Overall Cognitive Status: Within Functional Limits for tasks assessed                                        General Comments General comments (skin integrity, edema, etc.): Pt's wife present during session.     Exercises     Assessment/Plan    PT Assessment Patient needs continued PT services  PT Problem List Decreased strength;Decreased balance;Decreased activity tolerance;Decreased mobility;Decreased knowledge of use of DME;Decreased knowledge of precautions;Pain       PT Treatment Interventions DME instruction;Gait training;Functional mobility training;Therapeutic activities;Stair training;Therapeutic exercise;Balance training;Patient/family education    PT Goals (Current goals can be found in the Care Plan section)  Acute Rehab PT Goals Patient Stated Goal: to go home before 8 weeks is up  PT Goal Formulation: With patient Time For Goal Achievement: 12/13/17 Potential to Achieve Goals: Good    Frequency Min 3X/week   Barriers to discharge        Co-evaluation               AM-PAC PT "6 Clicks" Daily Activity  Outcome Measure Difficulty turning over in bed (including adjusting bedclothes, sheets and blankets)?: None Difficulty moving from lying on back to sitting on the side of the bed? : A Little Difficulty sitting down on and standing up from a chair with arms (e.g., wheelchair, bedside commode, etc,.)?: A Little Help needed moving to and from a bed to chair (including a wheelchair)?: A Lot Help needed walking in hospital room?: A Lot Help needed climbing 3-5 steps with a railing? : Total 6 Click Score: 15    End of Session Equipment Utilized During Treatment: Gait belt Activity Tolerance: Treatment limited secondary to medical complications (Comment)(dizziness ) Patient left: in bed;with call bell/phone within reach;with family/visitor present Nurse Communication: Mobility status;Other (comment)(dizziness  ) PT Visit Diagnosis: Other abnormalities of gait and mobility (R26.89);Difficulty in walking, not elsewhere classified (R26.2);Pain Pain - Right/Left: (bilateral ) Pain - part of body: Ankle and joints of foot    Time: 7096-2836 PT Time Calculation (min) (ACUTE ONLY): 27 min   Charges:   PT Evaluation $PT Eval Moderate Complexity: 1 Mod PT Treatments $Therapeutic Activity: 8-22 mins        Leighton Ruff, PT, DPT  Acute Rehabilitation Services  Pager: [phone] Office: [phone]   Luke [name] 11/29/2017, 2:19 PM

## 2017-11-29 NOTE — Anesthesia Procedure Notes (Signed)
Procedure Name: LMA Insertion Date/Time: 11/29/2017 7:34 AM Performed by: Gwyndolyn Saxon, CRNA Pre-anesthesia Checklist: Patient identified, Emergency Drugs available, Suction available and Patient being monitored Patient Re-evaluated:Patient Re-evaluated prior to induction Oxygen Delivery Method: Circle System Utilized Preoxygenation: Pre-oxygenation with 100% oxygen Induction Type: IV induction Ventilation: Mask ventilation without difficulty LMA: LMA inserted LMA Size: 4.0 Number of attempts: 1 Airway Equipment and Method: Bite block Placement Confirmation: positive ETCO2 Tube secured with: Tape Dental Injury: Teeth and Oropharynx as per pre-operative assessment  Comments: LMA applied by [facility]

## 2017-11-29 NOTE — Anesthesia Postprocedure Evaluation (Signed)
Anesthesia Post Note  [name]  Procedure(s) Performed: DEBRIDEMENT BILATERAL FEET, DEBRIDEMENT LEFT ANKLE, AND APPLY WOUND VAC (Bilateral Foot)     Patient location during evaluation: PACU Anesthesia Type: General Level of consciousness: awake and alert Pain management: pain level controlled Vital Signs Assessment: post-procedure vital signs reviewed and stable Respiratory status: spontaneous breathing, nonlabored ventilation and respiratory function stable Cardiovascular status: blood pressure returned to baseline and stable Postop Assessment: no apparent nausea or vomiting Anesthetic complications: no    Last Vitals:  Vitals:   11/29/17 0824 11/29/17 0859  BP: 111/62 127/78  Pulse: (!) 101 94  Resp: 15 18  Temp:  36.8 C  SpO2: 97% 100%    Last Pain:  Vitals:   11/29/17 0859  TempSrc: Oral  PainSc:                  Lynda Rainwater

## 2017-11-29 NOTE — Progress Notes (Signed)
PROGRESS NOTE  Luke [name] CBU:[id number] DOB: 04-26-1980 DOA: 11/21/2017 PCP: Gildardo Pounds, NP   LOS: 8 days   Brief Narrative / Interim history: [name] is a 37 y/o male with medical history of IVDA with recent relapse, insulin-dependent DM, and HTN who presents with sepsis secondary to soft tissue infection of bilateral feet and osteomyelitis of medial malleolus. Recently he relapsed and injected IV heroin in bilateral legs. He developed worsening wounds on bilateral feet and PCP urged to go to the ED. Also of note, his CBGs have recently been uncontrolled despite his usual insulin dosing and eating less. He arrived to the ED 9/5 febrile, tachycardic and in mild DKA with acidosis and ketonuria, labs revealed elevated WBC. XR of feet showed no bony abnormalities. He was admitted for sepsis due to cellulitis and started on IVF, insulin drip, empiric antibiotics and blood cultures taken.  Subjective: Patient said he is in some pain after having the debridement this morning, but no other complaints. Says his sugars have been better today. No chest pain, SOB, abdominal pain.   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 Active Problems:   Drug abuse, IV ([name])   Tobacco abuse   DM type 2, uncontrolled, with renal complications ([name])   Essential hypertension   Abscess   DKA (diabetic ketoacidoses) ([name])   Diabetic polyneuropathy associated with type 2 diabetes mellitus ([name])   GERD (gastroesophageal reflux disease)   Sepsis due to cellulitis ([name])   Hepatitis C   Actinomyces infection   Cellulitis of left foot   Acute hematogenous osteomyelitis, left ankle and foot ([name])  Sepsis secondary to bilateral lower extremity soft tissue infection with abscesses and osteomyelitis of the left medial malleolus along with actinomyces bacteremia - Sepsis pathology resolved, ID following and directing antimicrobial therapy- continue IV Pen G for  strep and actinomyces bacteremia - TEE and TTE showed no vegetation - Had surgical debridement of bilateral feet, left ankle, and wound vacs placed this AM with Ortho; pathology from debridement showed no organisms from left foot, and right foot few gram pos cocci, rare gram pos and gram neg rods - Patient aware that he is at risk of limb loss and he needs to remain in the hospital for continued treatment, emphasized this again today - Has history of IV drug use, therefore not a good candidate for outpatient IV antimicrobial therapy  DKA- resolved with IVF and insulin infusion  Insulin-dependent diabetes - Poorly controlled, Hgb A1c 12.1  - CBGs were high- increased insulin from 55 units to 62 units  Hypertension- BP stable, continue lisinopril  Hypothyrpidism- continue levothyroxine  Peripheral neuropathy- likely in the setting of poorly controlled DM, continue Neurontin  Chronic hepatitis C- will require outpatient follow-up from ID   Polysubstance abuse - Recently relapsed after reportedly being clean for 2 years and started using IV heroin-injected in his bilateral lower extremities - Has a history of numerous soft tissue infections in bilateral upper extremity and fasciotomy in left upper extremity - Continued counseling and reinforced importance of remaining inpatient for abx therapy   Scheduled Meds: . docusate sodium  100 mg Oral BID  . famotidine  20 mg Oral BID  . gabapentin  400 mg Oral TID  . hepatitis b vaccine  1 mL Intramuscular Once  . insulin aspart protamine- aspart  55 Units Subcutaneous Q supper  . insulin aspart protamine- aspart  62 Units Subcutaneous Q breakfast  . levothyroxine  50 mcg Oral QAC breakfast  . lisinopril  10 mg Oral Daily  . sodium chloride flush  3 mL Intravenous Q12H  . traZODone  50 mg Oral QHS   Continuous Infusions: . sodium chloride 10 mL/hr at 11/29/17 0950  . lactated ringers    . methocarbamol (ROBAXIN) IV    . pencillin G  potassium IV 4 Million Units (11/29/17 0957)   PRN Meds:.[START ON 11/30/2017] acetaminophen, bisacodyl, dextrose, hydrALAZINE, HYDROmorphone (DILAUDID) injection, magnesium citrate, methocarbamol **OR** methocarbamol (ROBAXIN) IV, metoCLOPramide **OR** metoCLOPramide (REGLAN) injection, ondansetron **OR** ondansetron (ZOFRAN) IV, oxyCODONE, oxyCODONE, polyethylene glycol, sodium chloride flush, sodium chloride flush, traMADol  DVT prophylaxis: SCDs, will start pharmalogical prophylaxis after all procedures have been completed  Code Status: full code Family Communication: none at bedside Disposition Plan: Remain inpatient  Consultants:   Infectious disease  Ortho  Procedures:   2D echo: 9/12  Antimicrobials:  Since admission- Vacomycin/Zosyn, Ancef, Flagyl, Rocephin  Current- Penicillin G  Objective: Vitals:   11/29/17 0524 11/29/17 0817 11/29/17 0824 11/29/17 0859  BP: (!) 141/88 104/61 111/62 127/78  Pulse: 85 95 (!) 101 94  Resp: 16  15 18   Temp: 98.1 F (36.7 C) (!) 97.5 F (36.4 C)  98.3 F (36.8 C)  TempSrc: Oral   Oral  SpO2: 100%  97% 100%  Weight:      Height:        Intake/Output Summary (Last 24 hours) at 11/29/2017 1108 Last data filed at 11/29/2017 0802 Gross per 24 hour  Intake 1287 ml  Output 1235 ml  Net 52 ml   Filed Weights   11/23/17 0227 11/28/17 0729  Weight: 86.8 kg 86.8 kg    Examination:  Constitutional: Lying comfortably in bed, NAD Respiratory: bilaterally clear to auscultation Cardiovascular: Regular rate and rhythm, no murmurs Abdomen: soft, non tender to palpation Musculoskeletal: bilateral wound vacs on feet Skin: no rashes Psychiatric: Alert and oriented x3. Normal mood and affect.    Data Reviewed: I have independently reviewed following labs and imaging studies  CBC: Recent Labs  Lab 11/22/17 1750 11/23/17 0410 11/24/17 0347 11/25/17 0319 11/26/17 0445  WBC 19.5* 16.9* 16.8* 15.8* 15.4*  NEUTROABS  --   --   --    --  8.9*  HGB 9.3* 9.4* 9.4* 9.6* 10.4*  HCT 28.9* 28.4* 29.2* 29.4* 32.4*  MCV 83.5 82.1 83.0 83.1 82.9  PLT 453* 426* 474* 504* 917*   Basic Metabolic Panel: Recent Labs  Lab 11/22/17 1750 11/23/17 0410 11/24/17 0347  11/25/17 1534 11/26/17 0445 11/27/17 0312 11/28/17 0500 11/29/17 0438  NA 138 135 137  --  138  --   --   --   --   K 3.6 4.5 4.0  --  4.0  --   --   --   --   CL 107 107 107  --  106  --   --   --   --   CO2 23 20* 23  --  23  --   --   --   --   GLUCOSE 169* 400* 246*  --  241*  --   --   --   --   BUN 11 10 11   --  8  --   --   --   --   CREATININE 1.16 1.31* 1.16   < > 1.19 1.14 1.24 1.26* 1.31*  CALCIUM 8.2* 8.2* 8.2*  --  8.4*  --   --   --   --    < > =  values in this interval not displayed.   GFR: Estimated Creatinine Clearance: 84.2 mL/min (A) (by C-G formula based on SCr of 1.31 mg/dL (H)). Liver Function Tests: Recent Labs  Lab 11/22/17 1750 11/23/17 0410  AST 15 16  ALT 11 11  ALKPHOS 94 88  BILITOT 0.7 0.4  PROT 5.5* 5.5*  ALBUMIN 2.2* 2.1*   No results for input(s): LIPASE, AMYLASE in the last 168 hours. No results for input(s): AMMONIA in the last 168 hours. Coagulation Profile: No results for input(s): INR, PROTIME in the last 168 hours. Cardiac Enzymes: No results for input(s): CKTOTAL, CKMB, CKMBINDEX, TROPONINI in the last 168 hours. BNP (last 3 results) No results for input(s): PROBNP in the last 8760 hours. HbA1C: No results for input(s): HGBA1C in the last 72 hours. CBG: Recent Labs  Lab 11/28/17 0929 11/28/17 1145 11/28/17 1630 11/28/17 2107 11/29/17 0714  GLUCAP 175* 343* 331* 195* 167*   Lipid Profile: No results for input(s): CHOL, HDL, LDLCALC, TRIG, CHOLHDL, LDLDIRECT in the last 72 hours. Thyroid Function Tests: No results for input(s): TSH, T4TOTAL, FREET4, T3FREE, THYROIDAB in the last 72 hours. Anemia Panel: No results for input(s): VITAMINB12, FOLATE, FERRITIN, TIBC, IRON, RETICCTPCT in the last 72  hours. Urine analysis:    Component Value Date/Time   COLORURINE STRAW (A) 11/21/2017 1320   APPEARANCEUR CLEAR 11/21/2017 1320   APPEARANCEUR Clear 04/11/2014 0138   LABSPEC 1.027 11/21/2017 1320   LABSPEC 1.026 04/11/2014 0138   PHURINE 6.0 11/21/2017 1320   GLUCOSEU >=500 (A) 11/21/2017 1320   GLUCOSEU >=500 04/11/2014 0138   HGBUR MODERATE (A) 11/21/2017 1320   BILIRUBINUR NEGATIVE 11/21/2017 1320   BILIRUBINUR neg 02/18/2017 0946   BILIRUBINUR Negative 04/11/2014 0138   KETONESUR 20 (A) 11/21/2017 1320   PROTEINUR 30 (A) 11/21/2017 1320   UROBILINOGEN 0.2 02/18/2017 0946   UROBILINOGEN 0.2 12/10/2013 1221   NITRITE NEGATIVE 11/21/2017 1320   LEUKOCYTESUR NEGATIVE 11/21/2017 1320   LEUKOCYTESUR Negative 04/11/2014 0138   Sepsis Labs: Invalid input(s): PROCALCITONIN, LACTICIDVEN  Recent Results (from the past 240 hour(s))  Blood Culture (routine x 2)     Status: Abnormal   Collection Time: 11/21/17  1:30 PM  Result Value Ref Range Status   Specimen Description BLOOD RIGHT HAND  Final   Special Requests   Final    BOTTLES DRAWN AEROBIC AND ANAEROBIC Blood Culture results may not be optimal due to an excessive volume of blood received in culture bottles   Culture  Setup Time   Final    GRAM POSITIVE COCCI AEROBIC BOTTLE ONLY CRITICAL RESULT CALLED TO, READ BACK BY AND VERIFIED WITH: PHARMD L SEAY 11/22/17 AT 2226 BY CM GRAM POSITIVE RODS ANAEROBIC BOTTLE ONLY CRITICAL RESULT CALLED TO, READ BACK BY AND VERIFIED WITH: D PEARCE,PHARMD AT 1710 11/23/17 BY L BENFIELD    Culture (A)  Final    VIRIDANS STREPTOCOCCUS THE SIGNIFICANCE OF ISOLATING THIS ORGANISM FROM A SINGLE SET OF BLOOD CULTURES WHEN MULTIPLE SETS ARE DRAWN IS UNCERTAIN. PLEASE NOTIFY THE MICROBIOLOGY DEPARTMENT WITHIN ONE WEEK IF SPECIATION AND SENSITIVITIES ARE REQUIRED. ACTINOMYCES NAESLUNDII Standardized susceptibility testing for this organism is not available. Performed at [facility], [place]  [address] [place], [place] [address]    Report Status 11/25/2017 FINAL  Final  Blood Culture ID Panel (Reflexed)     Status: Abnormal   Collection Time: 11/21/17  1:30 PM  Result Value Ref Range Status   Enterococcus species NOT DETECTED NOT DETECTED Final  Listeria monocytogenes NOT DETECTED NOT DETECTED Final   Staphylococcus species NOT DETECTED NOT DETECTED Final   Staphylococcus aureus NOT DETECTED NOT DETECTED Final   Streptococcus species DETECTED (A) NOT DETECTED Final    Comment: Not Enterococcus species, Streptococcus agalactiae, Streptococcus pyogenes, or Streptococcus pneumoniae. CRITICAL RESULT CALLED TO, READ BACK BY AND VERIFIED WITH: PHARMD L SEAY 11/22/17 AT 2227 BY CM    Streptococcus agalactiae NOT DETECTED NOT DETECTED Final   Streptococcus pneumoniae NOT DETECTED NOT DETECTED Final   Streptococcus pyogenes NOT DETECTED NOT DETECTED Final   Acinetobacter baumannii NOT DETECTED NOT DETECTED Final   Enterobacteriaceae species NOT DETECTED NOT DETECTED Final   Enterobacter cloacae complex NOT DETECTED NOT DETECTED Final   Escherichia coli NOT DETECTED NOT DETECTED Final   Klebsiella oxytoca NOT DETECTED NOT DETECTED Final   Klebsiella pneumoniae NOT DETECTED NOT DETECTED Final   Proteus species NOT DETECTED NOT DETECTED Final   Serratia marcescens NOT DETECTED NOT DETECTED Final   Haemophilus influenzae NOT DETECTED NOT DETECTED Final   Neisseria meningitidis NOT DETECTED NOT DETECTED Final   Pseudomonas aeruginosa NOT DETECTED NOT DETECTED Final   Candida albicans NOT DETECTED NOT DETECTED Final   Candida glabrata NOT DETECTED NOT DETECTED Final   Candida krusei NOT DETECTED NOT DETECTED Final   Candida parapsilosis NOT DETECTED NOT DETECTED Final   Candida tropicalis NOT DETECTED NOT DETECTED Final    Comment: Performed at [facility], [place] [address], [place], [place] [address]  Blood Culture (routine x 2)     Status: None   Collection Time: 11/21/17  8:10 PM    Result Value Ref Range Status   Specimen Description BLOOD SITE NOT SPECIFIED  Final   Special Requests   Final    BOTTLES DRAWN AEROBIC ONLY Blood Culture results may not be optimal due to an inadequate volume of blood received in culture bottles   Culture   Final    NO GROWTH 5 DAYS Performed at [facility], [place] [address] [place], [place] [address]    Report Status 11/26/2017 FINAL  Final  MRSA PCR Screening     Status: None   Collection Time: 11/21/17  8:19 PM  Result Value Ref Range Status   MRSA by PCR NEGATIVE NEGATIVE Final    Comment:        The GeneXpert MRSA Assay (FDA approved for NASAL specimens only), is one component of a comprehensive MRSA colonization surveillance program. It is not intended to diagnose MRSA infection nor to guide or monitor treatment for MRSA infections. Performed at [facility], [place] [address], [place], [place] [address]   Aerobic Culture (superficial specimen)     Status: None   Collection Time: 11/23/17  1:35 PM  Result Value Ref Range Status   Specimen Description WOUND FOOT LEFT  Final   Special Requests Normal  Final   Gram Stain   Final    MODERATE WBC PRESENT, PREDOMINANTLY PMN MODERATE GRAM POSITIVE RODS FEW GRAM NEGATIVE RODS RARE GRAM POSITIVE COCCI IN PAIRS    Culture   Final    FEW STREPTOCOCCUS GROUP F MODERATE LACTOBACILLUS SPECIES Standardized susceptibility testing for this organism is not available. Performed at [facility], [place] [address] [place], [place] [address]    Report Status 11/27/2017 FINAL  Final  Surgical pcr screen     Status: None   Collection Time: 11/24/17 12:03 AM  Result Value Ref Range Status   MRSA, PCR  NEGATIVE NEGATIVE Final   Staphylococcus aureus NEGATIVE NEGATIVE Final    Comment: (NOTE) The Xpert SA Assay (FDA approved for NASAL specimens in patients 56 years of age and older), is one component of a comprehensive surveillance program. It is not intended to  diagnose infection nor to guide or monitor treatment. Performed at [name], [place] [address], [place], [place] [address]   Aerobic/Anaerobic Culture (surgical/deep wound)     Status: None (Preliminary result)   Collection Time: 11/29/17  7:41 AM  Result Value Ref Range Status   Specimen Description ABSCESS RIGHT FOOT  Final   Special Requests NONE  Final   Gram Stain   Final    MODERATE WBC PRESENT, PREDOMINANTLY PMN FEW GRAM POSITIVE COCCI RARE GRAM NEGATIVE RODS RARE GRAM POSITIVE RODS Performed at [facility], [address], [place], [place] [address]    Culture PENDING  Incomplete   Report Status PENDING  Incomplete  Aerobic Culture (superficial specimen)     Status: None (Preliminary result)   Collection Time: 11/29/17  7:46 AM  Result Value Ref Range Status   Specimen Description ABSCESS LEFT FOOT  Final   Special Requests NONE  Final   Gram Stain   Final    FEW WBC PRESENT, PREDOMINANTLY PMN NO ORGANISMS SEEN Performed at [facility], [place] [address] [place], [place] [address]    Culture PENDING  Incomplete   Report Status PENDING  Incomplete      Radiology Studies: No results found.     Time spent: Grainger, MD, PhD [facility] Pager [phone]  If 7PM-7AM, please contact night-coverage [email or web address] Password [email or web address] 11/29/2017, 11:08 AM   [email or web address] @

## 2017-11-29 NOTE — Interval H&P Note (Signed)
History and Physical Interval Note:  11/29/2017 6:37 AM  Luke [name]  has presented today for surgery, with the diagnosis of Abscess bilateral feet  The various methods of treatment have been discussed with the patient and family. After consideration of risks, benefits and other options for treatment, the patient has consented to  Procedure(s): DEBRIDEMENT BILATERAL FEET, DEBRIDEMENT LEFT ANKLE, AND APPLY WOUND VAC (Bilateral) as a surgical intervention .  The patient's history has been reviewed, patient examined, no change in status, stable for surgery.  I have reviewed the patient's chart and labs.  Questions were answered to the patient's satisfaction.     [name]

## 2017-11-29 NOTE — Progress Notes (Signed)
Orthopedic Tech Progress Note Patient Details:  Luke [name] 1980-07-01 [id number]  Ortho Devices Ortho Device/Splint Location: bilateral post op shoes Ortho Device/Splint Interventions: Application   Post Interventions Patient Tolerated: Well Instructions Provided: Care of device   [place] Pink 11/29/2017, 7:13 PM

## 2017-11-29 NOTE — Transfer of Care (Signed)
Immediate Anesthesia Transfer of Care Note  [name]  Procedure(s) Performed: DEBRIDEMENT BILATERAL FEET, DEBRIDEMENT LEFT ANKLE, AND APPLY WOUND VAC (Bilateral Foot)  Patient Location: PACU  Anesthesia Type:General  Level of Consciousness: drowsy, patient cooperative and responds to stimulation  Airway & Oxygen Therapy: Patient Spontanous Breathing  Post-op Assessment: Report given to RN and Post -op Vital signs reviewed and stable  Post vital signs: Reviewed and stable  Last Vitals:  Vitals Value Taken Time  BP 104/61 11/29/2017  8:18 AM  Temp    Pulse 94 11/29/2017  8:19 AM  Resp 19 11/29/2017  8:19 AM  SpO2 96 % 11/29/2017  8:19 AM  Vitals shown include unvalidated device data.  Last Pain:  Vitals:   11/29/17 0524  TempSrc: Oral  PainSc:       Patients Stated Pain Goal: 2 (19/47/12 5271)  Complications: No apparent anesthesia complications

## 2017-11-29 NOTE — Progress Notes (Signed)
    [facility]    Date of Admission:  11/21/2017   Total days of antibiotics 9        Day 5 penicillin           ID: Luke [name] is a 37 y.o. 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 Has wound vac in place.  Medications:  . docusate sodium  100 mg Oral BID  . famotidine  20 mg Oral BID  . gabapentin  400 mg Oral TID  . hepatitis b vaccine  1 mL Intramuscular Once  . insulin aspart protamine- aspart  55 Units Subcutaneous Q supper  . insulin aspart protamine- aspart  62 Units Subcutaneous Q breakfast  . levothyroxine  50 mcg Oral QAC breakfast  . lisinopril  10 mg Oral Daily  . sodium chloride flush  3 mL Intravenous Q12H  . traZODone  50 mg Oral QHS    Objective: Vital signs in last 24 hours: Temp:  [97.5 F (36.4 C)-98.9 F (37.2 C)] 98.3 F (36.8 C) (09/13 1546) Pulse Rate:  [85-101] 95 (09/13 1546) Resp:  [15-18] 18 (09/13 1546) BP: (104-141)/(61-92) 130/87 (09/13 1546) SpO2:  [97 %-100 %] 100 % (09/13 1546) FiO2 (%):  [0 %] 0 % (09/13 0916) Physical Exam  Constitutional: He is oriented to person, place, and time. He appears well-developed and well-nourished. No distress.  HENT:  Mouth/Throat:  Oropharynx is clear and moist. No oropharyngeal exudate.  Cardiovascular: Normal rate, regular rhythm and normal heart sounds. Exam reveals no gallop and no friction rub.  No murmur heard.  Pulmonary/Chest: Effort normal and breath sounds normal. No respiratory distress. He has no wheezes.  Abdominal: Soft. Bowel sounds are normal. He exhibits no distension. There is no tenderness.  Ext: bilateral wound vac in place. 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 No rash noted. No erythema.  Psychiatric: He has a normal mood and affect. His behavior is normal.     Lab Results Recent Labs    11/28/17 0500 11/29/17 0438  CREATININE 1.26* 1.31*    Microbiology: reviewed Studies/Results: No results found.   Assessment/Plan: Strep/actinomyces osteo and bacteremia =  Continue with penicillin Recommend to treat for minimum of 4 wk, will need months of amoxicillin thereafter Will check sed rate and crp tomorrow  Chronic hep c without hepatic coma = will address as outpatient  Will see back on [facility] Cell: [phone] Pager: [phone]  11/29/2017, 4:48 PM

## 2017-11-29 NOTE — Progress Notes (Signed)
Inpatient Diabetes Program Recommendations  AACE/ADA: New Consensus Statement on Inpatient Glycemic Control (2015)  Target Ranges:  Prepandial:   less than 140 mg/dL      Peak postprandial:   less than 180 mg/dL (1-2 hours)      Critically ill patients:  140 - 180 mg/dL   Lab Results  Component Value Date   GLUCAP 167 (H) 11/29/2017   HGBA1C 12.1 (H) 11/22/2017    Review of Glycemic Control Results for Luke [name], Luke [name] (MRN [id number]) as of 11/29/2017 14:59  Ref. Range 11/28/2017 11:45 11/28/2017 16:30 11/28/2017 21:07 11/29/2017 07:14  Glucose-Capillary Latest Ref Range: 70 - 99 mg/dL 343 (H) 331 (H) 195 (H) 167 (H)   Diabetes history:DM 2 Outpatient Diabetes medications: 70/30 40 units qam, 30 units qpm, Regular insulin SSI tid Current orders for Inpatient glycemic control: 70/30 55 units QPM, 70/30 62 units QAM  Inpatient Diabetes Program Recommendations:    Noted adjustments made to 70/30 today. Have been waiting to see the impact of that increase.  At 1400: Double checked on obtaining blood sugars post procedure. Discussed with RN attempting to make recommendations.   At this time, it may be worth establishing better control by changing the regimen.   Given lack of BS control, plan for remaining inpatient for anbx and in the setting of infection consider:  -Levemir 35 units BID -Novolog 8 units TID -Novolog 0-9 units TID -Novolog 0-5 units QHS  Thanks, Bronson Curb, MSN, RNC-OB Diabetes Coordinator [phone] (8a-5p)

## 2017-11-29 NOTE — Op Note (Signed)
11/29/2017  8:18 AM  PATIENT:  Luke [name]    PRE-OPERATIVE DIAGNOSIS:  Abscess bilateral feet, osteomyelitis left medial malleolous  POST-OPERATIVE DIAGNOSIS:  Same  PROCEDURE:  DEBRIDEMENT BILATERAL FEET, DEBRIDEMENT LEFT ANKLE, AND APPLY WOUND VAC  SURGEON:  [name], MD  PHYSICIAN ASSISTANT:None ANESTHESIA:   General  PREOPERATIVE INDICATIONS:  Luke [name] is a  37 y.o. male with a diagnosis of Abscess bilateral feet who failed conservative measures and elected for surgical management.    The risks benefits and alternatives were discussed with the patient preoperatively including but not limited to the risks of infection, bleeding, nerve injury, cardiopulmonary complications, the need for revision surgery, among others, and the patient was willing to proceed.  OPERATIVE IMPLANTS: Wound vacs for both lower extremities.  Cultures obtained x3 with bone cultures as well of the medial malleolus  @ENCIMAGES @  OPERATIVE FINDINGS: Abscess dorsum of left foot circumferential first metatarsal left foot and abscess and osteomyelitis medial malleolus left foot with large abscess dorsum of the right foot.  No swelling  OPERATIVE PROCEDURE: Patient was brought to the operating room and underwent a general anesthetic.  After adequate levels of anesthesia were obtained patient's both lower extremities were prepped using DuraPrep draped into a sterile field a timeout was called.  Attention was first focused on the dorsum of the right foot.  A dorsal incision was made over the abscess there is a large purulent abscess cultures were obtained.  Soft tissue skin fascia and muscle was sharply excised with a knife and a rondure.  The wound was irrigated with normal saline all remaining tissue was healthy and viable the incision was closed loosely with 2-0 nylon attention was then focused on the left lower extremity.  A large extensile incision was made over the medial malleolus.  Skin soft tissue and  fascia were excised with a 21 blade knife and a rondure.  Osteotome was used to remove the superficial aspect of the medial malleolus the abscess extended down to bone of the MRI scan did show involvement of the medial malleolus.  This was excised back to healthy viable bone.  The bone was excised with the rondure.  Osteotome was used for further excision of the medial malleolus.  The wound was irrigated with normal saline incision was closed using 2-0 nylon.  Incision was then made over the first ray skin and soft tissue muscle and fascia were excised cultures were obtained excision sharply with a 21 blade knife and a rondure.  This was debrided back to healthy viable tissue the wound was irrigated with normal saline and the incision was loosely closed with 2-0 nylon.  Attention was then focused on the dorsum of the left foot.  A 21 blade knife was used to excise skin and soft tissue and fascia.  There is no purulent abscess at this location but nonviable tissue.  This was all excised irrigated normal saline the dorsal wound was approximately 4 cm in diameter.  Wound vacs were applied to all wounds and incisions connected to to wound VAC pumps.  Patient was extubated taken the PACU in stable condition.   DISCHARGE PLANNING:  Antibiotic duration: Continue IV antibiotics for at least 4 weeks these were significant infections including osteomyelitis of the medial malleolus.  Weightbearing: Patient may be weightbearing as tolerated  Pain medication: Opioid pathway ordered.  Dressing care/ Wound VAC: Continue wound vacs for 1 week with the Praveena plus wound VAC at discharge  Ambulatory devices: Gilford Rile  Discharge to: Anticipate discharge to home.  Follow-up: In the office 1 week post operative.

## 2017-11-30 ENCOUNTER — Encounter (HOSPITAL_COMMUNITY): Payer: Self-pay | Admitting: Orthopedic Surgery

## 2017-11-30 LAB — GLUCOSE, CAPILLARY
GLUCOSE-CAPILLARY: 185 mg/dL — AB (ref 70–99)
GLUCOSE-CAPILLARY: 222 mg/dL — AB (ref 70–99)
GLUCOSE-CAPILLARY: 51 mg/dL — AB (ref 70–99)
GLUCOSE-CAPILLARY: 82 mg/dL (ref 70–99)
Glucose-Capillary: 478 mg/dL — ABNORMAL HIGH (ref 70–99)
Glucose-Capillary: 483 mg/dL — ABNORMAL HIGH (ref 70–99)

## 2017-11-30 LAB — BASIC METABOLIC PANEL
ANION GAP: 7 (ref 5–15)
BUN: 20 mg/dL (ref 6–20)
CALCIUM: 8.3 mg/dL — AB (ref 8.9–10.3)
CHLORIDE: 103 mmol/L (ref 98–111)
CO2: 23 mmol/L (ref 22–32)
Creatinine, Ser: 1.43 mg/dL — ABNORMAL HIGH (ref 0.61–1.24)
GFR calc Af Amer: >60 mL/min (ref >60)
GFR calc non Af Amer: >60 mL/min (ref >60)
GLUCOSE: 484 mg/dL — AB (ref 70–99)
Potassium: 4.8 mmol/L (ref 3.5–5.1)
Sodium: 133 mmol/L — ABNORMAL LOW (ref 135–145)

## 2017-11-30 LAB — CBC
HEMATOCRIT: 30.6 % — AB (ref 39.0–52.0)
HEMOGLOBIN: 9.4 g/dL — AB (ref 13.0–17.0)
MCH: 26.3 pg (ref 26.0–34.0)
MCHC: 30.7 g/dL (ref 30.0–36.0)
MCV: 85.5 fL (ref 78.0–100.0)
Platelets: 562 10*3/uL — ABNORMAL HIGH (ref 150–400)
RBC: 3.58 MIL/uL — ABNORMAL LOW (ref 4.22–5.81)
RDW: 14.1 % (ref 11.5–15.5)
WBC: 15.2 10*3/uL — AB (ref 4.0–10.5)

## 2017-11-30 LAB — CREATININE, SERUM
Creatinine, Ser: 1.45 mg/dL — ABNORMAL HIGH (ref 0.61–1.24)
GFR calc Af Amer: >60 mL/min (ref >60)

## 2017-11-30 LAB — HEPATITIS A ANTIBODY, TOTAL: HEP A TOTAL AB: NEGATIVE

## 2017-11-30 MED ORDER — ENOXAPARIN SODIUM 40 MG/0.4ML ~~LOC~~ SOLN
40.0000 mg | Freq: Every day | SUBCUTANEOUS | Status: DC
  Administered 2017-12-06: 40 mg via SUBCUTANEOUS
  Filled 2017-11-30 (×11): qty 0.4

## 2017-11-30 MED ORDER — INSULIN ASPART PROT & ASPART (70-30 MIX) 100 UNIT/ML ~~LOC~~ SUSP
65.0000 [IU] | Freq: Every day | SUBCUTANEOUS | Status: DC
  Administered 2017-11-30: 65 [IU] via SUBCUTANEOUS

## 2017-11-30 NOTE — Progress Notes (Signed)
Inpatient Diabetes Program Recommendations  AACE/ADA: New Consensus Statement on Inpatient Glycemic Control (2015)  Target Ranges:  Prepandial:   less than 140 mg/dL      Peak postprandial:   less than 180 mg/dL (1-2 hours)      Critically ill patients:  140 - 180 mg/dL   Lab Results  Component Value Date   GLUCAP 483 (H) 11/30/2017   HGBA1C 12.1 (H) 11/22/2017    Diabetes history:DM 2 Outpatient Diabetes medications: 70/30 40 units qam, 30 units qpm, Regular insulin SSI tid Current orders for Inpatient glycemic control: 70/3055 units QPM, 70/30 62 units QAM  Inpatient Diabetes Program Recommendations:    FSBS this AM was 478 mg/dL. CMP completed and not indicative of DKA. Patient did received PM dose of 70/30, however assuming that NPH is not lasting long enough for patient's needs, may want to transition to alternative regimen with more flexibility for BS control.  Given such fluctuations of BS, plan for remaining inpatient for anbx and in the setting of infection consider:  -Levemir 35 units BID -Novolog 8 units TID -Novolog 0-9 units TID -Novolog 0-5 units QHS Text page sent.   Thanks, Bronson Curb, MSN, RNC-OB Diabetes Coordinator [phone] (8a-5p)

## 2017-11-30 NOTE — Progress Notes (Signed)
   Subjective:  Patient reports pain as mild.  No events.  Objective:   VITALS:   Vitals:   11/29/17 0916 11/29/17 1546 11/29/17 2211 11/30/17 0552  BP:  130/87 138/80 (!) 143/83  Pulse:  95 (!) 103 89  Resp:  18 20 17   Temp:  98.3 F (36.8 C) 99 F (37.2 C) 98.4 F (36.9 C)  TempSrc:  Oral Oral Oral  SpO2: 100% 100% 98% 98%  Weight:      Height:        Neurologically intact Neurovascular intact Sensation intact distally Intact pulses distally Dorsiflexion/Plantar flexion intact Incision: dressing C/D/I and no drainage No cellulitis present Compartment soft   Lab Results  Component Value Date   WBC 15.4 (H) 11/26/2017   HGB 10.4 (L) 11/26/2017   HCT 32.4 (L) 11/26/2017   MCV 82.9 11/26/2017   PLT 576 (H) 11/26/2017     Assessment/Plan:  1 Day Post-Op   - Expected postop acute blood loss anemia - will monitor for symptoms - Up with PT/OT - WBAT BLE only when needed, otherwise keep elevated - wound vac to stay on until follow up appt with Dr. Warren Danes 11/30/2017, 8:22 AM [phone]

## 2017-11-30 NOTE — Progress Notes (Signed)
PROGRESS NOTE        [name] Age: 37 y.o. Sex: male Date of Birth: [birth year] Admit Date: 11/21/2017 Admitting Physician Patrecia Pour, MD [id number], [name], NP  Brief Narrative: Patient is a 37 y.o. male with history of IVDA (relapse recently) insulin-dependent diabetes, hypertension who presented with sepsis secondary to soft tissue infection with abscesses of bilateral feet, osteomyelitis of the medial malleolus and actinomyces bacteremia.  Patient underwent debridement of bilateral feet, debridement of the left ankle and application of wound VAC on 9/13.  See below for further details.  Subjective: Lying comfortably in bed-some pain at the operative site and bilateral feet.  Assessment/Plan: Sepsis secondary to bilateral lower extremity soft tissue infection with abscesses and osteomyelitis of the left medial malleolus along with actinomyces bacteremia: Sepsis pathophysiology has resolved, continue IV penicillin G.  Underwent debridement by orthopedics on 9/13.  TEE on 9/12 did not show any vegetation.  Ortho and ID continue to follow.  Recommendations are to IV antibiotics for at least 4 weeks, followed by prolonged course of oral amoxicillin for several months thereafter.  Patient was encouraged to remain hospitalized-he is aware of limb loss, sepsis, and even death.  Unfortunately with his history of IV drug use, he is not a candidate for outpatient IV antimicrobial therapy.  DKA: Resolved with insulin infusion.  Mild AKI: Likely hemodynamically mediated-could be from hypoglycemia-we will gently hydrate for a few hours and recheck electrolytes tomorrow.  Insulin-dependent diabetes: Overall poorly controlled, A1c 12.1.  CBGs continue to fluctuate, blood sugars in the 400s this morning-we will increase p.m. dosing of insulin 70/30 from 55 units to 65 units, continue with 62 units of 70/30 insulin in the morning.  Follow and optimize  accordingly.   Hypertension: BP controlled-continue lisinopril.  Hypothyroidism: Continue levothyroxine  Peripheral neuropathy: Likely related to diabetes, continue Neurontin  Chronic hepatitis C: We will need outpatient follow-up at the ID clinic.  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  Claims he was clean for 2 years before his most recent relapse.  He has a history of numerous soft tissue infections in his bilateral upper extremity-and apparently has a history of fasciotomy in his left upper extremity.  Has been counseled extensively.  DVT Prophylaxis: Prophylactic Lovenox  Code Status: Full code   Family Communication: None at bedside  Disposition Plan: Remain inpatient-given history of IV drug use-not a candidate for outpatient IV antimicrobial therapy, if he cannot be placed into SNF-will require several weeks of inpatient hospitalization.  Antimicrobial agents: Anti-infectives (From admission, onward)   Start     Dose/Rate Route Frequency Ordered Stop   11/27/17 0730  ceFAZolin (ANCEF) IVPB 2g/100 mL premix  Status:  Discontinued     2 g 200 mL/hr over 30 Minutes Intravenous To Short Stay 11/27/17 0716 11/27/17 1317   11/25/17 1400  penicillin G potassium 4 Million Units in dextrose 5 % 250 mL IVPB     4 Million Units 250 mL/hr over 60 Minutes Intravenous Every 4 hours 11/25/17 1254     11/25/17 0800  vancomycin (VANCOCIN) IVPB 1000 mg/200 mL premix  Status:  Discontinued     1,000 mg 200 mL/hr over 60 Minutes Intravenous Every 8 hours 11/25/17 0129 11/25/17 1413   11/24/17 0600  ceFAZolin (ANCEF) IVPB 2g/100  mL premix     2 g 200 mL/hr over 30 Minutes Intravenous On call to O.R. 11/23/17 2306 11/25/17 0559   11/23/17 1400  piperacillin-tazobactam (ZOSYN) IVPB 3.375 g  Status:  Discontinued     3.375 g 12.5 mL/hr over 240 Minutes Intravenous Every 8 hours 11/23/17 1031 11/25/17 1254    11/23/17 0000  metroNIDAZOLE (FLAGYL) IVPB 500 mg  Status:  Discontinued     500 mg 100 mL/hr over 60 Minutes Intravenous Every 8 hours 11/22/17 2312 11/23/17 1014   11/22/17 2315  cefTRIAXone (ROCEPHIN) 2 g in sodium chloride 0.9 % 100 mL IVPB  Status:  Discontinued     2 g 200 mL/hr over 30 Minutes Intravenous Every 24 hours 11/22/17 2312 11/23/17 1014   11/22/17 1400  piperacillin-tazobactam (ZOSYN) IVPB 3.375 g  Status:  Discontinued     3.375 g 12.5 mL/hr over 240 Minutes Intravenous Every 8 hours 11/22/17 1043 11/22/17 2311   11/22/17 1045  vancomycin (VANCOCIN) IVPB 750 mg/150 ml premix  Status:  Discontinued     750 mg 150 mL/hr over 60 Minutes Intravenous Every 12 hours 11/22/17 1043 11/25/17 0129   11/21/17 1830  vancomycin (VANCOCIN) IVPB 750 mg/150 ml premix     750 mg 150 mL/hr over 60 Minutes Intravenous  Once 11/21/17 1816 11/21/17 2030   11/21/17 1715  vancomycin (VANCOCIN) IVPB 1000 mg/200 mL premix     1,000 mg 200 mL/hr over 60 Minutes Intravenous  Once 11/21/17 1710 11/21/17 1914   11/21/17 1715  piperacillin-tazobactam (ZOSYN) IVPB 3.375 g     3.375 g 12.5 mL/hr over 240 Minutes Intravenous  Once 11/21/17 1710 11/21/17 2030      Procedures: 9/13>> debridement bilateral feet, debridement left ankle and application of wound VAC (Dr. Sharol Given)  9/12>> TEE No vegetations are seen. There is 1-2+ central aortic insufficiency.  Mild LVH. Chiari network (normal variant) is present.  9/9>>TTE: - Left ventricle: There was mild concentric hypertrophy. Systolic   function was normal. The estimated ejection fraction was in the   range of 60% to 65%. Left ventricular diastolic function   parameters were normal. - Aortic valve: Transvalvular velocity was within the normal range.   There was no stenosis. There was mild regurgitation. Valve area   (VTI): 2.83 cm^2. Valve area (Vmax): 2.61 cm^2. Valve area   (Vmean): 2.72 cm^2. Regurgitation pressure half-time: 944 ms. -  Left atrium: The atrium was mildly dilated. - Atrial septum: No defect or patent foramen ovale was identified   by color flow Doppler. - Tricuspid valve: There was mild regurgitation. - Pulmonic valve: There was moderate regurgitation. - Pulmonary arteries: Systolic pressure was within the normal   range. PA peak pressure: 30 mm Hg (S). - Pericardium, extracardiac: A trivial pericardial effusion was   identified.  CONSULTS:  ID and orthopedic surgery  Time spent: 25- minutes-Greater than 50% of this time was spent in counseling, explanation of diagnosis, planning of further management, and coordination of care.  MEDICATIONS: Scheduled Meds: . docusate sodium  100 mg Oral BID  . famotidine  20 mg Oral BID  . gabapentin  400 mg Oral TID  . hepatitis b vaccine  1 mL Intramuscular Once  . insulin aspart protamine- aspart  55 Units Subcutaneous Q supper  . insulin aspart protamine- aspart  62 Units Subcutaneous Q breakfast  . levothyroxine  50 mcg Oral QAC breakfast  . lisinopril  10 mg Oral Daily  . sodium chloride flush  3 mL Intravenous  Q12H  . traZODone  50 mg Oral QHS   Continuous Infusions: . sodium chloride 10 mL/hr at 11/29/17 0950  . lactated ringers    . methocarbamol (ROBAXIN) IV    . pencillin G potassium IV 4 Million Units (11/30/17 0928)   PRN Meds:.acetaminophen, bisacodyl, dextrose, hydrALAZINE, HYDROmorphone (DILAUDID) injection, magnesium citrate, methocarbamol **OR** methocarbamol (ROBAXIN) IV, metoCLOPramide **OR** metoCLOPramide (REGLAN) injection, ondansetron **OR** ondansetron (ZOFRAN) IV, oxyCODONE, oxyCODONE, polyethylene glycol, sodium chloride flush, sodium chloride flush, traMADol   PHYSICAL EXAM: Vital signs: Vitals:   11/29/17 1546 11/29/17 2211 11/30/17 0552 11/30/17 0927  BP: 130/87 138/80 (!) 143/83 (!) 141/86  Pulse: 95 (!) 103 89   Resp: 18 20 17    Temp: 98.3 F (36.8 C) 99 F (37.2 C) 98.4 F (36.9 C)   TempSrc: Oral Oral Oral   SpO2:  100% 98% 98%   Weight:      Height:       Filed Weights   11/23/17 0227 11/28/17 0729  Weight: 86.8 kg 86.8 kg   Body mass index is 28.26 kg/m.   General appearance:Awake, alert, not in any distress.  Eyes:no scleral icterus. HEENT: Atraumatic and Normocephalic Neck: supple, no JVD. Resp:Good air entry bilaterally,no rales or rhonchi CVS: S1 S2 regular, no murmurs.  GI: Bowel sounds present, Non tender and not distended with no gaurding, rigidity or rebound. Extremities: B/L Lower Ext shows no edema, both legs are warm to touch Neurology:  Non focal Psychiatric: Normal judgment and insight. Normal mood. Musculoskeletal:No digital cyanosis Skin:No Rash, warm and dry Wounds:N/A  I have personally reviewed following labs and imaging studies  LABORATORY DATA: CBC: Recent Labs  Lab 11/24/17 0347 11/25/17 0319 11/26/17 0445 11/30/17 0808  WBC 16.8* 15.8* 15.4* 15.2*  NEUTROABS  --   --  8.9*  --   HGB 9.4* 9.6* 10.4* 9.4*  HCT 29.2* 29.4* 32.4* 30.6*  MCV 83.0 83.1 82.9 85.5  PLT 474* 504* 576* 562*    Basic Metabolic Panel: Recent Labs  Lab 11/24/17 0347  11/25/17 1534  11/27/17 0312 11/28/17 0500 11/29/17 0438 11/30/17 0410 11/30/17 0808  NA 137  --  138  --   --   --   --   --  133*  K 4.0  --  4.0  --   --   --   --   --  4.8  CL 107  --  106  --   --   --   --   --  103  CO2 23  --  23  --   --   --   --   --  23  GLUCOSE 246*  --  241*  --   --   --   --   --  484*  BUN 11  --  8  --   --   --   --   --  20  CREATININE 1.16   < > 1.19   < > 1.24 1.26* 1.31* 1.45* 1.43*  CALCIUM 8.2*  --  8.4*  --   --   --   --   --  8.3*   < > = values in this interval not displayed.    GFR: Estimated Creatinine Clearance: 77.1 mL/min (A) (by C-G formula based on SCr of 1.43 mg/dL (H)).  Liver Function Tests: No results for input(s): AST, ALT, ALKPHOS, BILITOT, PROT, ALBUMIN in the last 168 hours. No results for input(s): LIPASE, AMYLASE in the last 168  hours. No  results for input(s): AMMONIA in the last 168 hours.  Coagulation Profile: No results for input(s): INR, PROTIME in the last 168 hours.  Cardiac Enzymes: No results for input(s): CKTOTAL, CKMB, CKMBINDEX, TROPONINI in the last 168 hours.  BNP (last 3 results) No results for input(s): PROBNP in the last 8760 hours.  HbA1C: No results for input(s): HGBA1C in the last 72 hours.  CBG: Recent Labs  Lab 11/29/17 0714 11/29/17 1712 11/29/17 2214 11/30/17 0814 11/30/17 0908  GLUCAP 167* 298* 135* 478* 483*    Lipid Profile: No results for input(s): CHOL, HDL, LDLCALC, TRIG, CHOLHDL, LDLDIRECT in the last 72 hours.  Thyroid Function Tests: No results for input(s): TSH, T4TOTAL, FREET4, T3FREE, THYROIDAB in the last 72 hours.  Anemia Panel: No results for input(s): VITAMINB12, FOLATE, FERRITIN, TIBC, IRON, RETICCTPCT in the last 72 hours.  Urine analysis:    Component Value Date/Time   COLORURINE STRAW (A) 11/21/2017 1320   APPEARANCEUR CLEAR 11/21/2017 1320   APPEARANCEUR Clear 04/11/2014 0138   LABSPEC 1.027 11/21/2017 1320   LABSPEC 1.026 04/11/2014 0138   PHURINE 6.0 11/21/2017 1320   GLUCOSEU >=500 (A) 11/21/2017 1320   GLUCOSEU >=500 04/11/2014 0138   HGBUR MODERATE (A) 11/21/2017 1320   BILIRUBINUR NEGATIVE 11/21/2017 1320   BILIRUBINUR neg 02/18/2017 0946   BILIRUBINUR Negative 04/11/2014 0138   KETONESUR 20 (A) 11/21/2017 1320   PROTEINUR 30 (A) 11/21/2017 1320   UROBILINOGEN 0.2 02/18/2017 0946   UROBILINOGEN 0.2 12/10/2013 1221   NITRITE NEGATIVE 11/21/2017 1320   LEUKOCYTESUR NEGATIVE 11/21/2017 1320   LEUKOCYTESUR Negative 04/11/2014 0138    Sepsis Labs: Lactic Acid, Venous    Component Value Date/Time   LATICACIDVEN 1.77 11/21/2017 1957    MICROBIOLOGY: Recent Results (from the past 240 hour(s))  Blood Culture (routine x 2)     Status: Abnormal   Collection Time: 11/21/17  1:30 PM  Result Value Ref Range Status   Specimen Description BLOOD  RIGHT HAND  Final   Special Requests   Final    BOTTLES DRAWN AEROBIC AND ANAEROBIC Blood Culture results may not be optimal due to an excessive volume of blood received in culture bottles   Culture  Setup Time   Final    GRAM POSITIVE COCCI AEROBIC BOTTLE ONLY CRITICAL RESULT CALLED TO, READ BACK BY AND VERIFIED WITH: PHARMD L SEAY 11/22/17 AT 2226 BY CM GRAM POSITIVE RODS ANAEROBIC BOTTLE ONLY CRITICAL RESULT CALLED TO, READ BACK BY AND VERIFIED WITH: D PEARCE,PHARMD AT 1710 11/23/17 BY L BENFIELD    Culture (A)  Final    VIRIDANS STREPTOCOCCUS THE SIGNIFICANCE OF ISOLATING THIS ORGANISM FROM A SINGLE SET OF BLOOD CULTURES WHEN MULTIPLE SETS ARE DRAWN IS UNCERTAIN. PLEASE NOTIFY THE MICROBIOLOGY DEPARTMENT WITHIN ONE WEEK IF SPECIATION AND SENSITIVITIES ARE REQUIRED. ACTINOMYCES NAESLUNDII Standardized susceptibility testing for this organism is not available. Performed at [facility], [place] [address] [place], [place] [address]    Report Status 11/25/2017 FINAL  Final  Blood Culture ID Panel (Reflexed)     Status: Abnormal   Collection Time: 11/21/17  1:30 PM  Result Value Ref Range Status   Enterococcus species NOT DETECTED NOT DETECTED Final   Listeria monocytogenes NOT DETECTED NOT DETECTED Final   Staphylococcus species NOT DETECTED NOT DETECTED Final   Staphylococcus aureus NOT DETECTED NOT DETECTED Final   Streptococcus species DETECTED (A) NOT DETECTED Final    Comment: Not Enterococcus species, Streptococcus agalactiae, Streptococcus pyogenes, or Streptococcus pneumoniae.  CRITICAL RESULT CALLED TO, READ BACK BY AND VERIFIED WITH: PHARMD L SEAY 11/22/17 AT 2227 BY CM    Streptococcus agalactiae NOT DETECTED NOT DETECTED Final   Streptococcus pneumoniae NOT DETECTED NOT DETECTED Final   Streptococcus pyogenes NOT DETECTED NOT DETECTED Final   Acinetobacter baumannii NOT DETECTED NOT DETECTED Final   Enterobacteriaceae species NOT DETECTED NOT DETECTED Final   Enterobacter  cloacae complex NOT DETECTED NOT DETECTED Final   Escherichia coli NOT DETECTED NOT DETECTED Final   Klebsiella oxytoca NOT DETECTED NOT DETECTED Final   Klebsiella pneumoniae NOT DETECTED NOT DETECTED Final   Proteus species NOT DETECTED NOT DETECTED Final   Serratia marcescens NOT DETECTED NOT DETECTED Final   Haemophilus influenzae NOT DETECTED NOT DETECTED Final   Neisseria meningitidis NOT DETECTED NOT DETECTED Final   Pseudomonas aeruginosa NOT DETECTED NOT DETECTED Final   Candida albicans NOT DETECTED NOT DETECTED Final   Candida glabrata NOT DETECTED NOT DETECTED Final   Candida krusei NOT DETECTED NOT DETECTED Final   Candida parapsilosis NOT DETECTED NOT DETECTED Final   Candida tropicalis NOT DETECTED NOT DETECTED Final    Comment: Performed at [facility], [place] [address], [place], [place] [address]  Blood Culture (routine x 2)     Status: None   Collection Time: 11/21/17  8:10 PM  Result Value Ref Range Status   Specimen Description BLOOD SITE NOT SPECIFIED  Final   Special Requests   Final    BOTTLES DRAWN AEROBIC ONLY Blood Culture results may not be optimal due to an inadequate volume of blood received in culture bottles   Culture   Final    NO GROWTH 5 DAYS Performed at [facility], [place] [address], [place], [place] [address]    Report Status 11/26/2017 FINAL  Final  MRSA PCR Screening     Status: None   Collection Time: 11/21/17  8:19 PM  Result Value Ref Range Status   MRSA by PCR NEGATIVE NEGATIVE Final    Comment:        The GeneXpert MRSA Assay (FDA approved for NASAL specimens only), is one component of a comprehensive MRSA colonization surveillance program. It is not intended to diagnose MRSA infection nor to guide or monitor treatment for MRSA infections. Performed at [facility], [place] [address], [place], [place] [address]   Aerobic Culture (superficial specimen)     Status: None   Collection Time: 11/23/17  1:35 PM  Result  Value Ref Range Status   Specimen Description WOUND FOOT LEFT  Final   Special Requests Normal  Final   Gram Stain   Final    MODERATE WBC PRESENT, PREDOMINANTLY PMN MODERATE GRAM POSITIVE RODS FEW GRAM NEGATIVE RODS RARE GRAM POSITIVE COCCI IN PAIRS    Culture   Final    FEW STREPTOCOCCUS GROUP F MODERATE LACTOBACILLUS SPECIES Standardized susceptibility testing for this organism is not available. Performed at [facility], [place] [address], [place], [place] [address]    Report Status 11/27/2017 FINAL  Final  Surgical pcr screen     Status: None   Collection Time: 11/24/17 12:03 AM  Result Value Ref Range Status   MRSA, PCR NEGATIVE NEGATIVE Final   Staphylococcus aureus NEGATIVE NEGATIVE Final    Comment: (NOTE) The Xpert SA Assay (FDA approved for NASAL specimens in patients 57 years of age and older), is one component of a comprehensive surveillance program. It is not intended to diagnose infection nor  to guide or monitor treatment. Performed at [facility], [place] [address], [place], [place] [address]   Aerobic/Anaerobic Culture (surgical/deep wound)     Status: None (Preliminary result)   Collection Time: 11/29/17  7:41 AM  Result Value Ref Range Status   Specimen Description ABSCESS RIGHT FOOT  Final   Special Requests NONE  Final   Gram Stain   Final    MODERATE WBC PRESENT, PREDOMINANTLY PMN FEW GRAM POSITIVE COCCI RARE GRAM NEGATIVE RODS RARE GRAM POSITIVE RODS Performed at [facility], [address] [place], [place] [address]    Culture PENDING  Incomplete   Report Status PENDING  Incomplete  Aerobic Culture (superficial specimen)     Status: None (Preliminary result)   Collection Time: 11/29/17  7:46 AM  Result Value Ref Range Status   Specimen Description ABSCESS LEFT FOOT  Final   Special Requests NONE  Final   Gram Stain   Final    FEW WBC PRESENT, PREDOMINANTLY PMN NO ORGANISMS SEEN Performed at [facility], [place] [address] [place], [place] [address]    Culture PENDING  Incomplete   Report Status PENDING  Incomplete  Anaerobic culture     Status: None (Preliminary result)   Collection Time: 11/29/17  7:47 AM  Result Value Ref Range Status   Specimen Description ABSCESS LEFT ANKLE  Final   Special Requests NONE  Final   Gram Stain   Final    FEW WBC PRESENT, PREDOMINANTLY PMN NO ORGANISMS SEEN Performed at [facility], [address] [place], [place] [address]    Culture PENDING  Incomplete   Report Status PENDING  Incomplete    RADIOLOGY STUDIES/RESULTS: Mr Foot Left W Wo Contrast  Result Date: 11/22/2017 CLINICAL DATA:  IV drug abuser who injects in his feet. Bilateral foot wounds for 1 week. Chills and malaise. EXAM: MRI OF THE LEFT FOREFOOT WITHOUT AND WITH CONTRAST TECHNIQUE: Multiplanar, multisequence MR imaging of the left forefoot was performed both before and after administration of intravenous contrast. CONTRAST:  8.5 cc Gadavist IV COMPARISON:  Plain films left foot 11/21/2017. FINDINGS: Bones/Joint/Cartilage There is partial visualization of marrow edema in the medial malleolus with postcontrast enhancement. Bone marrow signal is otherwise normal. Ligaments Intact Muscles and Tendons No intramuscular fluid collection is identified. Soft tissues Diffuse subcutaneous edema enhancement are seen about the foot. There is an abscess in the dorsal subcutaneous tissues centered over the bases of the first through third metatarsals. The abscess measures 1.8 cm craniocaudal by 4.6 cm transverse by 4.4 cm long. A second abscess in the subcutaneous tissues measuring 2.5 cm long by 2.3 cm craniocaudal by 0.9 cm transverse is identified along the proximal first metatarsal. This abscess is centered 1.5 cm distal to the first tarsometatarsal joint. A third abscess which is incompletely visualized is seen in subcutaneous tissues 3.2 cm superior to the tip of the medial malleolus. This collection measures  approximately 1.5 cm craniocaudal by 0.2 cm transverse. IMPRESSION: Three subcutaneous abscesses are identified as described above. The largest collection is in the dorsal soft tissues at the level of the proximal first and third metatarsals. Edema and enhancement the medial malleolus consistent with osteomyelitis. Electronically Signed   By: Inge Rise M.D.   On: 11/22/2017 13:01   Dg Foot Complete Left  Result Date: 11/21/2017 CLINICAL DATA:  Bilateral foot pain for 4-5 days, left-greater-than-right, swelling and open sores, the patient is diabetic, and has a smoking  history EXAM: LEFT FOOT - COMPLETE 3+ VIEW COMPARISON:  None. FINDINGS: There is soft tissue swelling over the dorsum of the left foot. However no underlying focal demineralization or erosion is seen to indicate osteomyelitis. No acute abnormality is noted. Joint spaces appear normal. No erosion is seen. IMPRESSION: Soft tissue swelling over the dorsum of the foot. No underlying bony abnormality. Electronically Signed   By: Ivar Drape M.D.   On: 11/21/2017 15:30   Dg Foot Complete Right  Result Date: 11/21/2017 CLINICAL DATA:  Bilateral foot pain for 4-5 days, left-greater-than-right with swelling and redness, the patient is diabetic and does have a smoking history EXAM: RIGHT FOOT COMPLETE - 3+ VIEW COMPARISON:  None FINDINGS: There is mild soft tissue swelling over the dorsum of the right foot. No underlying bony demineralization or erosion is seen to indicate active osteomyelitis. Alignment is normal and joint spaces appear normal. IMPRESSION: Soft tissue swelling over the dorsum. No evidence of active osteomyelitis. Electronically Signed   By: Ivar Drape M.D.   On: 11/21/2017 15:32   [place] Ekg Site Rite  Result Date: 11/22/2017 If Site Rite image not attached, placement could not be confirmed due to current cardiac rhythm.    LOS: 9 days   Oren Binet, MD  [facility]  If 7PM-7AM, please contact  night-coverage  Please page via [email or web address] [email or web address]-click on MD name and type text message  11/30/2017, 9:51 AM

## 2017-12-01 DIAGNOSIS — B955 Unspecified streptococcus as the cause of diseases classified elsewhere: Secondary | ICD-10-CM

## 2017-12-01 LAB — BASIC METABOLIC PANEL
ANION GAP: 7 (ref 5–15)
BUN: 14 mg/dL (ref 6–20)
CO2: 23 mmol/L (ref 22–32)
Calcium: 8.5 mg/dL — ABNORMAL LOW (ref 8.9–10.3)
Chloride: 109 mmol/L (ref 98–111)
Creatinine, Ser: 1.38 mg/dL — ABNORMAL HIGH (ref 0.61–1.24)
GFR calc Af Amer: >60 mL/min (ref >60)
GLUCOSE: 105 mg/dL — AB (ref 70–99)
POTASSIUM: 4 mmol/L (ref 3.5–5.1)
Sodium: 139 mmol/L (ref 135–145)

## 2017-12-01 LAB — CBC
HCT: 29 % — ABNORMAL LOW (ref 39.0–52.0)
HEMOGLOBIN: 9.2 g/dL — AB (ref 13.0–17.0)
MCH: 27.1 pg (ref 26.0–34.0)
MCHC: 31.7 g/dL (ref 30.0–36.0)
MCV: 85.3 fL (ref 78.0–100.0)
Platelets: 555 10*3/uL — ABNORMAL HIGH (ref 150–400)
RBC: 3.4 MIL/uL — AB (ref 4.22–5.81)
RDW: 14.4 % (ref 11.5–15.5)
WBC: 15.8 10*3/uL — AB (ref 4.0–10.5)

## 2017-12-01 LAB — AEROBIC CULTURE  (SUPERFICIAL SPECIMEN)

## 2017-12-01 LAB — AEROBIC CULTURE W GRAM STAIN (SUPERFICIAL SPECIMEN)

## 2017-12-01 LAB — GLUCOSE, CAPILLARY
GLUCOSE-CAPILLARY: 78 mg/dL (ref 70–99)
Glucose-Capillary: 113 mg/dL — ABNORMAL HIGH (ref 70–99)
Glucose-Capillary: 313 mg/dL — ABNORMAL HIGH (ref 70–99)
Glucose-Capillary: 161 mg/dL — ABNORMAL HIGH (ref 70–99)

## 2017-12-01 MED ORDER — INSULIN ASPART PROT & ASPART (70-30 MIX) 100 UNIT/ML ~~LOC~~ SUSP
50.0000 [IU] | Freq: Two times a day (BID) | SUBCUTANEOUS | Status: DC
  Administered 2017-12-02 – 2017-12-07 (×12): 50 [IU] via SUBCUTANEOUS
  Administered 2017-12-08: 40 [IU] via SUBCUTANEOUS
  Filled 2017-12-01 (×2): qty 10

## 2017-12-01 MED ORDER — HYDRALAZINE HCL 20 MG/ML IJ SOLN: 10.0000 mg | Freq: Four times a day (QID) | INTRAMUSCULAR | Status: DC | PRN

## 2017-12-01 MED ORDER — INSULIN ASPART PROT & ASPART (70-30 MIX) 100 UNIT/ML ~~LOC~~ SUSP
40.0000 [IU] | Freq: Once | SUBCUTANEOUS | Status: AC
  Administered 2017-12-01: 40 [IU] via SUBCUTANEOUS
  Filled 2017-12-01: qty 10

## 2017-12-01 MED ORDER — INSULIN ASPART 100 UNIT/ML ~~LOC~~ SOLN
0.0000 [IU] | Freq: Three times a day (TID) | SUBCUTANEOUS | Status: DC
  Administered 2017-12-01: 7 [IU] via SUBCUTANEOUS
  Administered 2017-12-02 (×2): 2 [IU] via SUBCUTANEOUS
  Administered 2017-12-03 – 2017-12-04 (×2): 7 [IU] via SUBCUTANEOUS
  Administered 2017-12-04: 3 [IU] via SUBCUTANEOUS
  Administered 2017-12-05: 2 [IU] via SUBCUTANEOUS
  Administered 2017-12-05: 9 [IU] via SUBCUTANEOUS
  Administered 2017-12-05 – 2017-12-06 (×3): 2 [IU] via SUBCUTANEOUS
  Administered 2017-12-06 – 2017-12-07 (×2): 9 [IU] via SUBCUTANEOUS

## 2017-12-01 MED ORDER — AMLODIPINE BESYLATE 10 MG PO TABS
10.0000 mg | ORAL_TABLET | Freq: Every day | ORAL | Status: DC
  Administered 2017-12-01 – 2017-12-12 (×12): 10 mg via ORAL
  Filled 2017-12-01 (×12): qty 1

## 2017-12-01 MED ORDER — LACTATED RINGERS IV SOLN
INTRAVENOUS | Status: DC
  Administered 2017-12-01: 14:00:00 via INTRAVENOUS

## 2017-12-01 MED ORDER — HYDRALAZINE HCL 25 MG PO TABS
25.0000 mg | ORAL_TABLET | Freq: Three times a day (TID) | ORAL | Status: DC
  Administered 2017-12-01 – 2017-12-04 (×9): 25 mg via ORAL
  Filled 2017-12-01 (×9): qty 1

## 2017-12-01 MED ORDER — INSULIN ASPART 100 UNIT/ML ~~LOC~~ SOLN
0.0000 [IU] | Freq: Every day | SUBCUTANEOUS | Status: DC
  Administered 2017-12-02: 2 [IU] via SUBCUTANEOUS
  Administered 2017-12-03 – 2017-12-05 (×2): 4 [IU] via SUBCUTANEOUS

## 2017-12-01 NOTE — Progress Notes (Signed)
PROGRESS NOTE        [name] Age: 37 y.o. Sex: male Date of Birth: 23-Oct-1980 Admit Date: 11/21/2017 Admitting Physician Patrecia Pour, MD [id number], [name], NP  Brief Narrative: Patient is a 37 y.o. male with history of IVDA (relapse recently) insulin-dependent diabetes, hypertension who presented with sepsis secondary to soft tissue infection with abscesses of bilateral feet, osteomyelitis of the medial malleolus and actinomyces bacteremia.  Patient underwent debridement of bilateral feet, debridement of the left ankle and application of wound VAC on 9/13.  See below for further details.  Subjective:   Patient in bed, appears comfortable, denies any headache, no fever, no chest pain or pressure, no shortness of breath , no abdominal pain. No focal weakness.   Assessment/Plan:  Sepsis secondary to bilateral lower extremity soft tissue infection with abscesses and osteomyelitis of the left medial malleolus along with actinomyces bacteremia: Orthopedics and ID following, currently on IV penicillin G, sepsis physiology has resolved, he is status post debridement by orthopedics on 11/29/2017, continue wound VAC and wound care.  TTE and TEE done this admission are negative for any endocarditis.  He is supposed to get 4 weeks of IV antibiotics, has a PICC line in place.  Due to his history of IV drug use he will be kept in the hospital for monitoring as no safe placement can be arranged.  DKA in a patient with DM type II: Poor control outpatient with A1c of 12.1, DKA has resolved, he is currently on 70/30 insulin along with sliding scale.  Will drop 70/30 dose on 12/01/2017 and monitor closely.  CBG (last 3)  Recent Labs    11/30/17 2146 11/30/17 2217 12/01/17 0801  GLUCAP 51* 82 113*     Mild AKI: Improving with IV fluids continue to hydrate gently, improving.  Discontinue ACE inhibitor for now.  Hypertension: BP controlled-hold ACE  inhibitor due to AKI and monitor.  As needed hydralazine.  Hypothyroidism: Continue levothyroxine  Peripheral neuropathy: Likely related to diabetes, continue Neurontin  Chronic hepatitis C: We will need outpatient follow-up at the ID clinic.  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  Claims he was clean for 2 years before his most recent relapse.  He has a history of numerous soft tissue infections in his bilateral upper extremity-and apparently has a history of fasciotomy in his left upper extremity.  Has been counseled extensively.    DVT Prophylaxis: Prophylactic Lovenox  Code Status: Full code   Family Communication: None at bedside  Disposition Plan: Remain inpatient-given history of IV drug use-not a candidate for outpatient IV antimicrobial therapy, if he cannot be placed into SNF-will require several weeks of inpatient hospitalization.  Antimicrobial agents: Anti-infectives (From admission, onward)   Start     Dose/Rate Route Frequency Ordered Stop   11/27/17 0730  ceFAZolin (ANCEF) IVPB 2g/100 mL premix  Status:  Discontinued     2 g 200 mL/hr over 30 Minutes Intravenous To Short Stay 11/27/17 0716 11/27/17 1317   11/25/17 1400  penicillin G potassium 4 Million Units in dextrose 5 % 250 mL IVPB     4 Million Units 250 mL/hr over 60 Minutes Intravenous Every 4 hours 11/25/17 1254     11/25/17 0800  vancomycin (VANCOCIN) IVPB 1000 mg/200 mL premix  Status:  Discontinued     1,000 mg 200 mL/hr over 60 Minutes Intravenous Every 8 hours 11/25/17 0129 11/25/17 1413   11/24/17 0600  ceFAZolin (ANCEF) IVPB 2g/100 mL premix     2 g 200 mL/hr over 30 Minutes Intravenous On call to O.R. 11/23/17 2306 11/25/17 0559   11/23/17 1400  piperacillin-tazobactam (ZOSYN) IVPB 3.375 g  Status:  Discontinued     3.375 g 12.5 mL/hr over 240 Minutes Intravenous Every 8 hours 11/23/17 1031 11/25/17 1254    11/23/17 0000  metroNIDAZOLE (FLAGYL) IVPB 500 mg  Status:  Discontinued     500 mg 100 mL/hr over 60 Minutes Intravenous Every 8 hours 11/22/17 2312 11/23/17 1014   11/22/17 2315  cefTRIAXone (ROCEPHIN) 2 g in sodium chloride 0.9 % 100 mL IVPB  Status:  Discontinued     2 g 200 mL/hr over 30 Minutes Intravenous Every 24 hours 11/22/17 2312 11/23/17 1014   11/22/17 1400  piperacillin-tazobactam (ZOSYN) IVPB 3.375 g  Status:  Discontinued     3.375 g 12.5 mL/hr over 240 Minutes Intravenous Every 8 hours 11/22/17 1043 11/22/17 2311   11/22/17 1045  vancomycin (VANCOCIN) IVPB 750 mg/150 ml premix  Status:  Discontinued     750 mg 150 mL/hr over 60 Minutes Intravenous Every 12 hours 11/22/17 1043 11/25/17 0129   11/21/17 1830  vancomycin (VANCOCIN) IVPB 750 mg/150 ml premix     750 mg 150 mL/hr over 60 Minutes Intravenous  Once 11/21/17 1816 11/21/17 2030   11/21/17 1715  vancomycin (VANCOCIN) IVPB 1000 mg/200 mL premix     1,000 mg 200 mL/hr over 60 Minutes Intravenous  Once 11/21/17 1710 11/21/17 1914   11/21/17 1715  piperacillin-tazobactam (ZOSYN) IVPB 3.375 g     3.375 g 12.5 mL/hr over 240 Minutes Intravenous  Once 11/21/17 1710 11/21/17 2030      Procedures: 9/13>> debridement bilateral feet, debridement left ankle and application of wound VAC (Dr. Sharol Given)  9/12>> TEE No vegetations are seen. There is 1-2+ central aortic insufficiency.  Mild LVH. Chiari network (normal variant) is present.  9/9>>TTE: - Left ventricle: There was mild concentric hypertrophy. Systolic   function was normal. The estimated ejection fraction was in the   range of 60% to 65%. Left ventricular diastolic function   parameters were normal. - Aortic valve: Transvalvular velocity was within the normal range.   There was no stenosis. There was mild regurgitation. Valve area   (VTI): 2.83 cm^2. Valve area (Vmax): 2.61 cm^2. Valve area   (Vmean): 2.72 cm^2. Regurgitation pressure half-time: 944 ms. - Left  atrium: The atrium was mildly dilated. - Atrial septum: No defect or patent foramen ovale was identified   by color flow Doppler. - Tricuspid valve: There was mild regurgitation. - Pulmonic valve: There was moderate regurgitation. - Pulmonary arteries: Systolic pressure was within the normal   range. PA peak pressure: 30 mm Hg (S). - Pericardium, extracardiac: A trivial pericardial effusion was   identified.  CONSULTS:  ID and orthopedic surgery  Time spent: 25- minutes-Greater than 50% of this time was spent in counseling, explanation of diagnosis, planning of further management, and coordination of care.  MEDICATIONS: Scheduled Meds: . docusate sodium  100 mg Oral BID  . enoxaparin (LOVENOX) injection  40 mg Subcutaneous Daily  . famotidine  20 mg Oral BID  . gabapentin  400 mg Oral TID  . hepatitis b vaccine  1 mL Intramuscular Once  . insulin aspart protamine- aspart  62 Units Subcutaneous Q  breakfast  . insulin aspart protamine- aspart  65 Units Subcutaneous Q supper  . levothyroxine  50 mcg Oral QAC breakfast  . lisinopril  10 mg Oral Daily  . sodium chloride flush  3 mL Intravenous Q12H  . traZODone  50 mg Oral QHS   Continuous Infusions: . lactated ringers    . methocarbamol (ROBAXIN) IV    . pencillin G potassium IV 4 Million Units (12/01/17 1006)   PRN Meds:.acetaminophen, bisacodyl, dextrose, hydrALAZINE, HYDROmorphone (DILAUDID) injection, magnesium citrate, methocarbamol **OR** methocarbamol (ROBAXIN) IV, metoCLOPramide **OR** metoCLOPramide (REGLAN) injection, ondansetron **OR** ondansetron (ZOFRAN) IV, oxyCODONE, oxyCODONE, polyethylene glycol, sodium chloride flush, sodium chloride flush, traMADol   PHYSICAL EXAM: Vital signs: Vitals:   11/30/17 1500 11/30/17 2148 12/01/17 0625 12/01/17 0950  BP: (!) 148/82 139/87 (!) 145/82 (!) 153/90  Pulse: 91 (!) 106 91   Resp: 18 17 17    Temp: 98.4 F (36.9 C) 99 F (37.2 C) 98.3 F (36.8 C)   TempSrc: Oral Oral  Oral   SpO2: 100% 99% 99%   Weight:      Height:       Filed Weights   11/23/17 0227 11/28/17 0729  Weight: 86.8 kg 86.8 kg   Body mass index is 28.26 kg/m.   Exam  Awake Alert, Oriented X 3, No new F.N deficits, Normal affect [place].AT,PERRAL Supple Neck,No JVD, No cervical lymphadenopathy appriciated.  Symmetrical Chest wall movement, Good air movement bilaterally, CTAB RRR,No Gallops, Rubs or new Murmurs, No Parasternal Heave +ve B.Sounds, Abd Soft, No tenderness, No organomegaly appriciated, No rebound - guarding or rigidity. No Cyanosis, Clubbing or edema, Wound Vac to both feet   I have personally reviewed following labs and imaging studies  LABORATORY DATA: CBC: Recent Labs  Lab 11/25/17 0319 11/26/17 0445 11/30/17 0808 12/01/17 0322  WBC 15.8* 15.4* 15.2* 15.8*  NEUTROABS  --  8.9*  --   --   HGB 9.6* 10.4* 9.4* 9.2*  HCT 29.4* 32.4* 30.6* 29.0*  MCV 83.1 82.9 85.5 85.3  PLT 504* 576* 562* 555*    Basic Metabolic Panel: Recent Labs  Lab 11/25/17 1534  11/28/17 0500 11/29/17 0438 11/30/17 0410 11/30/17 0808 12/01/17 0322  NA 138  --   --   --   --  133* 139  K 4.0  --   --   --   --  4.8 4.0  CL 106  --   --   --   --  103 109  CO2 23  --   --   --   --  23 23  GLUCOSE 241*  --   --   --   --  484* 105*  BUN 8  --   --   --   --  20 14  CREATININE 1.19   < > 1.26* 1.31* 1.45* 1.43* 1.38*  CALCIUM 8.4*  --   --   --   --  8.3* 8.5*   < > = values in this interval not displayed.    GFR: Estimated Creatinine Clearance: 79.9 mL/min (A) (by C-G formula based on SCr of 1.38 mg/dL (H)).  Liver Function Tests: No results for input(s): AST, ALT, ALKPHOS, BILITOT, PROT, ALBUMIN in the last 168 hours. No results for input(s): LIPASE, AMYLASE in the last 168 hours. No results for input(s): AMMONIA in the last 168 hours.  Coagulation Profile: No results for input(s): INR, PROTIME in the last 168 hours.  Cardiac Enzymes: No results for input(s): CKTOTAL,  CKMB, CKMBINDEX, TROPONINI in the last 168 hours.  BNP (last 3 results) No results for input(s): PROBNP in the last 8760 hours.  HbA1C: No results for input(s): HGBA1C in the last 72 hours.  CBG: Recent Labs  Lab 11/30/17 1208 11/30/17 1705 11/30/17 2146 11/30/17 2217 12/01/17 0801  GLUCAP 185* 222* 51* 82 113*    Lipid Profile: No results for input(s): CHOL, HDL, LDLCALC, TRIG, CHOLHDL, LDLDIRECT in the last 72 hours.  Thyroid Function Tests: No results for input(s): TSH, T4TOTAL, FREET4, T3FREE, THYROIDAB in the last 72 hours.  Anemia Panel: No results for input(s): VITAMINB12, FOLATE, FERRITIN, TIBC, IRON, RETICCTPCT in the last 72 hours.  Urine analysis:    Component Value Date/Time   COLORURINE STRAW (A) 11/21/2017 1320   APPEARANCEUR CLEAR 11/21/2017 1320   APPEARANCEUR Clear 04/11/2014 0138   LABSPEC 1.027 11/21/2017 1320   LABSPEC 1.026 04/11/2014 0138   PHURINE 6.0 11/21/2017 1320   GLUCOSEU >=500 (A) 11/21/2017 1320   GLUCOSEU >=500 04/11/2014 0138   HGBUR MODERATE (A) 11/21/2017 1320   BILIRUBINUR NEGATIVE 11/21/2017 1320   BILIRUBINUR neg 02/18/2017 0946   BILIRUBINUR Negative 04/11/2014 0138   KETONESUR 20 (A) 11/21/2017 1320   PROTEINUR 30 (A) 11/21/2017 1320   UROBILINOGEN 0.2 02/18/2017 0946   UROBILINOGEN 0.2 12/10/2013 1221   NITRITE NEGATIVE 11/21/2017 1320   LEUKOCYTESUR NEGATIVE 11/21/2017 1320   LEUKOCYTESUR Negative 04/11/2014 0138    Sepsis Labs: Lactic Acid, Venous    Component Value Date/Time   LATICACIDVEN 1.77 11/21/2017 1957    MICROBIOLOGY: Recent Results (from the past 240 hour(s))  Blood Culture (routine x 2)     Status: Abnormal   Collection Time: 11/21/17  1:30 PM  Result Value Ref Range Status   Specimen Description BLOOD RIGHT HAND  Final   Special Requests   Final    BOTTLES DRAWN AEROBIC AND ANAEROBIC Blood Culture results may not be optimal due to an excessive volume of blood received in culture bottles    Culture  Setup Time   Final    GRAM POSITIVE COCCI AEROBIC BOTTLE ONLY CRITICAL RESULT CALLED TO, READ BACK BY AND VERIFIED WITH: PHARMD L SEAY 11/22/17 AT 2226 BY CM GRAM POSITIVE RODS ANAEROBIC BOTTLE ONLY CRITICAL RESULT CALLED TO, READ BACK BY AND VERIFIED WITH: D PEARCE,PHARMD AT 1710 11/23/17 BY L BENFIELD    Culture (A)  Final    VIRIDANS STREPTOCOCCUS THE SIGNIFICANCE OF ISOLATING THIS ORGANISM FROM A SINGLE SET OF BLOOD CULTURES WHEN MULTIPLE SETS ARE DRAWN IS UNCERTAIN. PLEASE NOTIFY THE MICROBIOLOGY DEPARTMENT WITHIN ONE WEEK IF SPECIATION AND SENSITIVITIES ARE REQUIRED. ACTINOMYCES NAESLUNDII Standardized susceptibility testing for this organism is not available. Performed at [facility], [place] [address] [place], [place] [address]    Report Status 11/25/2017 FINAL  Final  Blood Culture ID Panel (Reflexed)     Status: Abnormal   Collection Time: 11/21/17  1:30 PM  Result Value Ref Range Status   Enterococcus species NOT DETECTED NOT DETECTED Final   Listeria monocytogenes NOT DETECTED NOT DETECTED Final   Staphylococcus species NOT DETECTED NOT DETECTED Final   Staphylococcus aureus NOT DETECTED NOT DETECTED Final   Streptococcus species DETECTED (A) NOT DETECTED Final    Comment: Not Enterococcus species, Streptococcus agalactiae, Streptococcus pyogenes, or Streptococcus pneumoniae. CRITICAL RESULT CALLED TO, READ BACK BY AND VERIFIED WITH: PHARMD L SEAY 11/22/17 AT 2227 BY CM    Streptococcus agalactiae NOT DETECTED NOT DETECTED Final   Streptococcus pneumoniae NOT DETECTED  NOT DETECTED Final   Streptococcus pyogenes NOT DETECTED NOT DETECTED Final   Acinetobacter baumannii NOT DETECTED NOT DETECTED Final   Enterobacteriaceae species NOT DETECTED NOT DETECTED Final   Enterobacter cloacae complex NOT DETECTED NOT DETECTED Final   Escherichia coli NOT DETECTED NOT DETECTED Final   Klebsiella oxytoca NOT DETECTED NOT DETECTED Final   Klebsiella pneumoniae NOT DETECTED  NOT DETECTED Final   Proteus species NOT DETECTED NOT DETECTED Final   Serratia marcescens NOT DETECTED NOT DETECTED Final   Haemophilus influenzae NOT DETECTED NOT DETECTED Final   Neisseria meningitidis NOT DETECTED NOT DETECTED Final   Pseudomonas aeruginosa NOT DETECTED NOT DETECTED Final   Candida albicans NOT DETECTED NOT DETECTED Final   Candida glabrata NOT DETECTED NOT DETECTED Final   Candida krusei NOT DETECTED NOT DETECTED Final   Candida parapsilosis NOT DETECTED NOT DETECTED Final   Candida tropicalis NOT DETECTED NOT DETECTED Final    Comment: Performed at [facility], [place] [address], [place], [place] [address]  Blood Culture (routine x 2)     Status: None   Collection Time: 11/21/17  8:10 PM  Result Value Ref Range Status   Specimen Description BLOOD SITE NOT SPECIFIED  Final   Special Requests   Final    BOTTLES DRAWN AEROBIC ONLY Blood Culture results may not be optimal due to an inadequate volume of blood received in culture bottles   Culture   Final    NO GROWTH 5 DAYS Performed at [facility], [place] [address], [place], [place] [address]    Report Status 11/26/2017 FINAL  Final  MRSA PCR Screening     Status: None   Collection Time: 11/21/17  8:19 PM  Result Value Ref Range Status   MRSA by PCR NEGATIVE NEGATIVE Final    Comment:        The GeneXpert MRSA Assay (FDA approved for NASAL specimens only), is one component of a comprehensive MRSA colonization surveillance program. It is not intended to diagnose MRSA infection nor to guide or monitor treatment for MRSA infections. Performed at [facility], [place] [address], [place], [place] [address]   Aerobic Culture (superficial specimen)     Status: None   Collection Time: 11/23/17  1:35 PM  Result Value Ref Range Status   Specimen Description WOUND FOOT LEFT  Final   Special Requests Normal  Final   Gram Stain   Final    MODERATE WBC PRESENT, PREDOMINANTLY PMN MODERATE GRAM POSITIVE  RODS FEW GRAM NEGATIVE RODS RARE GRAM POSITIVE COCCI IN PAIRS    Culture   Final    FEW STREPTOCOCCUS GROUP F MODERATE LACTOBACILLUS SPECIES Standardized susceptibility testing for this organism is not available. Performed at [facility], [place] [address] [place], [place] [address]    Report Status 11/27/2017 FINAL  Final  Surgical pcr screen     Status: None   Collection Time: 11/24/17 12:03 AM  Result Value Ref Range Status   MRSA, PCR NEGATIVE NEGATIVE Final   Staphylococcus aureus NEGATIVE NEGATIVE Final    Comment: (NOTE) The Xpert SA Assay (FDA approved for NASAL specimens in patients 78 years of age and older), is one component of a comprehensive surveillance program. It is not intended to diagnose infection nor to guide or monitor treatment. Performed at [facility], [place] [address], [place], [place] [address]   Aerobic/Anaerobic Culture (surgical/deep wound)     Status: None (Preliminary result)  Collection Time: 11/29/17  7:41 AM  Result Value Ref Range Status   Specimen Description ABSCESS RIGHT FOOT  Final   Special Requests NONE  Final   Gram Stain   Final    MODERATE WBC PRESENT, PREDOMINANTLY PMN FEW GRAM POSITIVE COCCI RARE GRAM NEGATIVE RODS RARE GRAM POSITIVE RODS    Culture   Final    MODERATE LACTOBACILLUS SPECIES CULTURE REINCUBATED FOR BETTER GROWTH Performed at [facility], [place] [address] [place], [place] [address]    Report Status PENDING  Incomplete  Aerobic Culture (superficial specimen)     Status: None (Preliminary result)   Collection Time: 11/29/17  7:46 AM  Result Value Ref Range Status   Specimen Description ABSCESS LEFT FOOT  Final   Special Requests NONE  Final   Gram Stain   Final    FEW WBC PRESENT, PREDOMINANTLY PMN NO ORGANISMS SEEN    Culture   Final    NO GROWTH 1 DAY Performed at [facility], [place] [address] [place], [place] [address]    Report Status PENDING  Incomplete  Anaerobic culture      Status: None (Preliminary result)   Collection Time: 11/29/17  7:47 AM  Result Value Ref Range Status   Specimen Description ABSCESS LEFT ANKLE  Final   Special Requests NONE  Final   Gram Stain   Final    FEW WBC PRESENT, PREDOMINANTLY PMN NO ORGANISMS SEEN Performed at [facility], [place] [address], [place], [place] [address]    Culture PENDING  Incomplete   Report Status PENDING  Incomplete    RADIOLOGY STUDIES/RESULTS: Mr Foot Left W Wo Contrast  Result Date: 11/22/2017 CLINICAL DATA:  IV drug abuser who injects in his feet. Bilateral foot wounds for 1 week. Chills and malaise. EXAM: MRI OF THE LEFT FOREFOOT WITHOUT AND WITH CONTRAST TECHNIQUE: Multiplanar, multisequence MR imaging of the left forefoot was performed both before and after administration of intravenous contrast. CONTRAST:  8.5 cc Gadavist IV COMPARISON:  Plain films left foot 11/21/2017. FINDINGS: Bones/Joint/Cartilage There is partial visualization of marrow edema in the medial malleolus with postcontrast enhancement. Bone marrow signal is otherwise normal. Ligaments Intact Muscles and Tendons No intramuscular fluid collection is identified. Soft tissues Diffuse subcutaneous edema enhancement are seen about the foot. There is an abscess in the dorsal subcutaneous tissues centered over the bases of the first through third metatarsals. The abscess measures 1.8 cm craniocaudal by 4.6 cm transverse by 4.4 cm long. A second abscess in the subcutaneous tissues measuring 2.5 cm long by 2.3 cm craniocaudal by 0.9 cm transverse is identified along the proximal first metatarsal. This abscess is centered 1.5 cm distal to the first tarsometatarsal joint. A third abscess which is incompletely visualized is seen in subcutaneous tissues 3.2 cm superior to the tip of the medial malleolus. This collection measures approximately 1.5 cm craniocaudal by 0.2 cm transverse. IMPRESSION: Three subcutaneous abscesses are identified as described  above. The largest collection is in the dorsal soft tissues at the level of the proximal first and third metatarsals. Edema and enhancement the medial malleolus consistent with osteomyelitis. Electronically Signed   By: Inge Rise M.D.   On: 11/22/2017 13:01   Dg Foot Complete Left  Result Date: 11/21/2017 CLINICAL DATA:  Bilateral foot pain for 4-5 days, left-greater-than-right, swelling and open sores, the patient is diabetic, and has a smoking history EXAM: LEFT FOOT - COMPLETE 3+ VIEW COMPARISON:  None. FINDINGS: There is  soft tissue swelling over the dorsum of the left foot. However no underlying focal demineralization or erosion is seen to indicate osteomyelitis. No acute abnormality is noted. Joint spaces appear normal. No erosion is seen. IMPRESSION: Soft tissue swelling over the dorsum of the foot. No underlying bony abnormality. Electronically Signed   By: Ivar Drape M.D.   On: 11/21/2017 15:30   Dg Foot Complete Right  Result Date: 11/21/2017 CLINICAL DATA:  Bilateral foot pain for 4-5 days, left-greater-than-right with swelling and redness, the patient is diabetic and does have a smoking history EXAM: RIGHT FOOT COMPLETE - 3+ VIEW COMPARISON:  None FINDINGS: There is mild soft tissue swelling over the dorsum of the right foot. No underlying bony demineralization or erosion is seen to indicate active osteomyelitis. Alignment is normal and joint spaces appear normal. IMPRESSION: Soft tissue swelling over the dorsum. No evidence of active osteomyelitis. Electronically Signed   By: Ivar Drape M.D.   On: 11/21/2017 15:32   [place] Ekg Site Rite  Result Date: 11/22/2017 If Site Rite image not attached, placement could not be confirmed due to current cardiac rhythm.    LOS: 10 days   Lala Lund, MD  [facility]  If 7PM-7AM, please contact night-coverage  Please page via [email or web address]-click on MD name and type text message  12/01/2017, 12:07 PM

## 2017-12-01 NOTE — Progress Notes (Signed)
CBG: 313 at 1638  Informed Dr. Candiss Norse of pts request to decrease insulin 70/30 mix from 50 units to 40 units due to patient experiencing hypoglycemic symptoms at 1336 when CBG: 78. Per MD order hold 50 units of 70/30 and try 40 units once.

## 2017-12-01 NOTE — Progress Notes (Signed)
Hypoglycemic Event  CBG: 51  Treatment: 15 GM carbohydrate snack  Symptoms: Shaky and headache  Follow-up CBG: Time:2217 CBG Result:82  Possible Reasons for Event: Inadequate meal intake--did not eat a lot of his dinner per patient   Comments/MD notified:[place],NP notified. No new orders. Will continue to monitor and treat per MD orders.    [place]

## 2017-12-02 LAB — GLUCOSE, CAPILLARY
GLUCOSE-CAPILLARY: 124 mg/dL — AB (ref 70–99)
GLUCOSE-CAPILLARY: 169 mg/dL — AB (ref 70–99)
Glucose-Capillary: 195 mg/dL — ABNORMAL HIGH (ref 70–99)
Glucose-Capillary: 207 mg/dL — ABNORMAL HIGH (ref 70–99)

## 2017-12-02 LAB — HCV RNA (INTERNATIONAL UNITS)
HCV log10: 7.021 log10 IU/mL
Hcv Rna (International Units): 10500000 IU/mL

## 2017-12-02 LAB — BASIC METABOLIC PANEL
ANION GAP: 8 (ref 5–15)
BUN: 14 mg/dL (ref 6–20)
CO2: 25 mmol/L (ref 22–32)
Calcium: 8.5 mg/dL — ABNORMAL LOW (ref 8.9–10.3)
Chloride: 107 mmol/L (ref 98–111)
Creatinine, Ser: 1.28 mg/dL — ABNORMAL HIGH (ref 0.61–1.24)
GFR calc Af Amer: >60 mL/min (ref >60)
GLUCOSE: 114 mg/dL — AB (ref 70–99)
POTASSIUM: 3.7 mmol/L (ref 3.5–5.1)
Sodium: 140 mmol/L (ref 135–145)

## 2017-12-02 LAB — HEPATITIS C GENOTYPE

## 2017-12-02 LAB — HCV RNA QUANT RFLX ULTRA OR GENOTYP

## 2017-12-02 NOTE — Progress Notes (Signed)
Patient ID: Luke [name], male   DOB: 1980/04/25, 37 y.o.   MRN: [id number] Patient is status post debridement of abscesses and osteomyelitis bilateral lower extremities.  Cultures are growing multiple organisms.  Dissipate 4 weeks of IV antibiotics.  Continue wound VAC for 1 week.  Weightbearing as tolerated.

## 2017-12-02 NOTE — Progress Notes (Signed)
PROGRESS NOTE        [name] Age: 37 y.o. Sex: male Date of Birth: 06/05/1980 Admit Date: 11/21/2017 Admitting Physician Patrecia Pour, MD QQI:[id number], [name], NP  Brief Narrative: Patient is a 37 y.o. male with history of IVDA (relapse recently) insulin-dependent diabetes, hypertension who presented with sepsis secondary to soft tissue infection with abscesses of bilateral feet, osteomyelitis of the medial malleolus and actinomyces bacteremia.  Patient underwent debridement of bilateral feet, debridement of the left ankle and application of wound VAC on 9/13.  See below for further details.  Subjective:   Patient in bed, appears comfortable, denies any headache, no fever, no chest pain or pressure, no shortness of breath , no abdominal pain. No focal weakness.  Wants permission to go out of the hospital and smoke.  Politely declined.    Assessment/Plan:  Sepsis secondary to bilateral lower extremity soft tissue infection with abscesses and osteomyelitis of the left medial malleolus along with actinomyces bacteremia: Orthopedics and ID following, currently on IV penicillin G, sepsis physiology has resolved, he is status post debridement by orthopedics on 11/29/2017, continue wound VAC and wound care.  TTE and TEE done this admission are negative for any endocarditis.  He is supposed to get 4 weeks of IV antibiotics, has a PICC line in place.  Due to his history of IV drug use he will be kept in the hospital for monitoring as no safe placement can be arranged.  DKA in a patient with DM type II: Poor control outpatient with A1c of 12.1, DKA has resolved, he is currently on 70/30 insulin along with sliding scale.  Will drop 70/30 dose on 12/01/2017 and monitor closely.  CBG (last 3)  Recent Labs    12/01/17 2241 12/02/17 0814 12/02/17 1205  GLUCAP 161* 124* 169*     Mild AKI: Improving with IV fluids continue to hydrate gently,  improving.  Discontinue ACE inhibitor for now.  Hypertension: BP controlled-hold ACE inhibitor due to AKI and monitor.  As needed hydralazine.  Hypothyroidism: Continue levothyroxine  Peripheral neuropathy: Likely related to diabetes, continue Neurontin  Chronic hepatitis C: We will need outpatient follow-up at the ID clinic.  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  Claims he was clean for 2 years before his most recent relapse.  He has a history of numerous soft tissue infections in his bilateral upper extremity-and apparently has a history of fasciotomy in his left upper extremity.  Has been counseled extensively.    DVT Prophylaxis: Prophylactic Lovenox  Code Status: Full code   Family Communication: None at bedside  Disposition Plan: Remain inpatient-given history of IV drug use-not a candidate for outpatient IV antimicrobial therapy, if he cannot be placed into SNF-will require several weeks of inpatient hospitalization.  Antimicrobial agents: Anti-infectives (From admission, onward)   Start     Dose/Rate Route Frequency Ordered Stop   11/27/17 0730  ceFAZolin (ANCEF) IVPB 2g/100 mL premix  Status:  Discontinued     2 g 200 mL/hr over 30 Minutes Intravenous To Short Stay 11/27/17 0716 11/27/17 1317   11/25/17 1400  penicillin G potassium 4 Million Units in dextrose 5 % 250 mL IVPB     4 Million Units 250 mL/hr over 60 Minutes Intravenous Every 4 hours 11/25/17 1254  11/25/17 0800  vancomycin (VANCOCIN) IVPB 1000 mg/200 mL premix  Status:  Discontinued     1,000 mg 200 mL/hr over 60 Minutes Intravenous Every 8 hours 11/25/17 0129 11/25/17 1413   11/24/17 0600  ceFAZolin (ANCEF) IVPB 2g/100 mL premix     2 g 200 mL/hr over 30 Minutes Intravenous On call to O.R. 11/23/17 2306 11/25/17 0559   11/23/17 1400  piperacillin-tazobactam (ZOSYN) IVPB 3.375 g  Status:  Discontinued     3.375  g 12.5 mL/hr over 240 Minutes Intravenous Every 8 hours 11/23/17 1031 11/25/17 1254   11/23/17 0000  metroNIDAZOLE (FLAGYL) IVPB 500 mg  Status:  Discontinued     500 mg 100 mL/hr over 60 Minutes Intravenous Every 8 hours 11/22/17 2312 11/23/17 1014   11/22/17 2315  cefTRIAXone (ROCEPHIN) 2 g in sodium chloride 0.9 % 100 mL IVPB  Status:  Discontinued     2 g 200 mL/hr over 30 Minutes Intravenous Every 24 hours 11/22/17 2312 11/23/17 1014   11/22/17 1400  piperacillin-tazobactam (ZOSYN) IVPB 3.375 g  Status:  Discontinued     3.375 g 12.5 mL/hr over 240 Minutes Intravenous Every 8 hours 11/22/17 1043 11/22/17 2311   11/22/17 1045  vancomycin (VANCOCIN) IVPB 750 mg/150 ml premix  Status:  Discontinued     750 mg 150 mL/hr over 60 Minutes Intravenous Every 12 hours 11/22/17 1043 11/25/17 0129   11/21/17 1830  vancomycin (VANCOCIN) IVPB 750 mg/150 ml premix     750 mg 150 mL/hr over 60 Minutes Intravenous  Once 11/21/17 1816 11/21/17 2030   11/21/17 1715  vancomycin (VANCOCIN) IVPB 1000 mg/200 mL premix     1,000 mg 200 mL/hr over 60 Minutes Intravenous  Once 11/21/17 1710 11/21/17 1914   11/21/17 1715  piperacillin-tazobactam (ZOSYN) IVPB 3.375 g     3.375 g 12.5 mL/hr over 240 Minutes Intravenous  Once 11/21/17 1710 11/21/17 2030      Procedures: 9/13>> debridement bilateral feet, debridement left ankle and application of wound VAC (Dr. Sharol Given)  9/12>> TEE No vegetations are seen. There is 1-2+ central aortic insufficiency.  Mild LVH. Chiari network (normal variant) is present.  9/9>>TTE: - Left ventricle: There was mild concentric hypertrophy. Systolic   function was normal. The estimated ejection fraction was in the   range of 60% to 65%. Left ventricular diastolic function   parameters were normal. - Aortic valve: Transvalvular velocity was within the normal range.   There was no stenosis. There was mild regurgitation. Valve area   (VTI): 2.83 cm^2. Valve area (Vmax): 2.61  cm^2. Valve area   (Vmean): 2.72 cm^2. Regurgitation pressure half-time: 944 ms. - Left atrium: The atrium was mildly dilated. - Atrial septum: No defect or patent foramen ovale was identified   by color flow Doppler. - Tricuspid valve: There was mild regurgitation. - Pulmonic valve: There was moderate regurgitation. - Pulmonary arteries: Systolic pressure was within the normal   range. PA peak pressure: 30 mm Hg (S). - Pericardium, extracardiac: A trivial pericardial effusion was   identified.  CONSULTS:  ID and orthopedic surgery  Time spent: 25- minutes-Greater than 50% of this time was spent in counseling, explanation of diagnosis, planning of further management, and coordination of care.  MEDICATIONS: Scheduled Meds: . amLODipine  10 mg Oral Daily  . docusate sodium  100 mg Oral BID  . enoxaparin (LOVENOX) injection  40 mg Subcutaneous Daily  . famotidine  20 mg Oral BID  . gabapentin  400 mg Oral TID  .  hepatitis b vaccine  1 mL Intramuscular Once  . hydrALAZINE  25 mg Oral Q8H  . insulin aspart  0-5 Units Subcutaneous QHS  . insulin aspart  0-9 Units Subcutaneous TID WC  . insulin aspart protamine- aspart  50 Units Subcutaneous BID WC  . levothyroxine  50 mcg Oral QAC breakfast  . traZODone  50 mg Oral QHS   Continuous Infusions: . pencillin G potassium IV 4 Million Units (12/02/17 0930)   PRN Meds:.acetaminophen, bisacodyl, dextrose, hydrALAZINE, HYDROmorphone (DILAUDID) injection, magnesium citrate, [DISCONTINUED] ondansetron **OR** ondansetron (ZOFRAN) IV, oxyCODONE, polyethylene glycol, sodium chloride flush, traMADol   PHYSICAL EXAM: Vital signs: Vitals:   12/01/17 1410 12/01/17 1448 12/01/17 2240 12/02/17 0510  BP: (!) 149/87 132/80 131/82 138/88  Pulse: 100 96 95 82  Resp: 18 20 18 16   Temp:  98.8 F (37.1 C) 98.1 F (36.7 C)   TempSrc:  Oral Oral   SpO2: 100% 98% 99% 100%  Weight:      Height:       Filed Weights   11/23/17 0227 11/28/17 0729    Weight: 86.8 kg 86.8 kg   Body mass index is 28.26 kg/m.   Exam  Awake Alert, Oriented X 3, No new F.N deficits, Normal affect [place].AT,PERRAL Supple Neck,No JVD, No cervical lymphadenopathy appriciated.  Symmetrical Chest wall movement, Good air movement bilaterally, CTAB RRR,No Gallops, Rubs or new Murmurs, No Parasternal Heave +ve B.Sounds, Abd Soft, No tenderness, No organomegaly appriciated, No rebound - guarding or rigidity. No Cyanosis, Clubbing or edema, Wound Vac to both feet   I have personally reviewed following labs and imaging studies  LABORATORY DATA: CBC: Recent Labs  Lab 11/26/17 0445 11/30/17 0808 12/01/17 0322  WBC 15.4* 15.2* 15.8*  NEUTROABS 8.9*  --   --   HGB 10.4* 9.4* 9.2*  HCT 32.4* 30.6* 29.0*  MCV 82.9 85.5 85.3  PLT 576* 562* 555*    Basic Metabolic Panel: Recent Labs  Lab 11/25/17 1534  11/29/17 0438 11/30/17 0410 11/30/17 0808 12/01/17 0322 12/02/17 0312  NA 138  --   --   --  133* 139 140  K 4.0  --   --   --  4.8 4.0 3.7  CL 106  --   --   --  103 109 107  CO2 23  --   --   --  23 23 25   GLUCOSE 241*  --   --   --  484* 105* 114*  BUN 8  --   --   --  20 14 14   CREATININE 1.19   < > 1.31* 1.45* 1.43* 1.38* 1.28*  CALCIUM 8.4*  --   --   --  8.3* 8.5* 8.5*   < > = values in this interval not displayed.    GFR: Estimated Creatinine Clearance: 86.2 mL/min (A) (by C-G formula based on SCr of 1.28 mg/dL (H)).  Liver Function Tests: No results for input(s): AST, ALT, ALKPHOS, BILITOT, PROT, ALBUMIN in the last 168 hours. No results for input(s): LIPASE, AMYLASE in the last 168 hours. No results for input(s): AMMONIA in the last 168 hours.  Coagulation Profile: No results for input(s): INR, PROTIME in the last 168 hours.  Cardiac Enzymes: No results for input(s): CKTOTAL, CKMB, CKMBINDEX, TROPONINI in the last 168 hours.  BNP (last 3 results) No results for input(s): PROBNP in the last 8760 hours.  HbA1C: No results for  input(s): HGBA1C in the last 72 hours.  CBG: Recent Labs  Lab 12/01/17 1336 12/01/17 1638 12/01/17 2241 12/02/17 0814 12/02/17 1205  GLUCAP 78 313* 161* 124* 169*    Lipid Profile: No results for input(s): CHOL, HDL, LDLCALC, TRIG, CHOLHDL, LDLDIRECT in the last 72 hours.  Thyroid Function Tests: No results for input(s): TSH, T4TOTAL, FREET4, T3FREE, THYROIDAB in the last 72 hours.  Anemia Panel: No results for input(s): VITAMINB12, FOLATE, FERRITIN, TIBC, IRON, RETICCTPCT in the last 72 hours.  Urine analysis:    Component Value Date/Time   COLORURINE STRAW (A) 11/21/2017 1320   APPEARANCEUR CLEAR 11/21/2017 1320   APPEARANCEUR Clear 04/11/2014 0138   LABSPEC 1.027 11/21/2017 1320   LABSPEC 1.026 04/11/2014 0138   PHURINE 6.0 11/21/2017 1320   GLUCOSEU >=500 (A) 11/21/2017 1320   GLUCOSEU >=500 04/11/2014 0138   HGBUR MODERATE (A) 11/21/2017 1320   BILIRUBINUR NEGATIVE 11/21/2017 1320   BILIRUBINUR neg 02/18/2017 0946   BILIRUBINUR Negative 04/11/2014 0138   KETONESUR 20 (A) 11/21/2017 1320   PROTEINUR 30 (A) 11/21/2017 1320   UROBILINOGEN 0.2 02/18/2017 0946   UROBILINOGEN 0.2 12/10/2013 1221   NITRITE NEGATIVE 11/21/2017 1320   LEUKOCYTESUR NEGATIVE 11/21/2017 1320   LEUKOCYTESUR Negative 04/11/2014 0138    Sepsis Labs: Lactic Acid, Venous    Component Value Date/Time   LATICACIDVEN 1.77 11/21/2017 1957    MICROBIOLOGY: Recent Results (from the past 240 hour(s))  Aerobic Culture (superficial specimen)     Status: None   Collection Time: 11/23/17  1:35 PM  Result Value Ref Range Status   Specimen Description WOUND FOOT LEFT  Final   Special Requests Normal  Final   Gram Stain   Final    MODERATE WBC PRESENT, PREDOMINANTLY PMN MODERATE GRAM POSITIVE RODS FEW GRAM NEGATIVE RODS RARE GRAM POSITIVE COCCI IN PAIRS    Culture   Final    FEW STREPTOCOCCUS GROUP F MODERATE LACTOBACILLUS SPECIES Standardized susceptibility testing for this organism is  not available. Performed at [facility], [place] [address], [place], [place] [address]    Report Status 11/27/2017 FINAL  Final  Surgical pcr screen     Status: None   Collection Time: 11/24/17 12:03 AM  Result Value Ref Range Status   MRSA, PCR NEGATIVE NEGATIVE Final   Staphylococcus aureus NEGATIVE NEGATIVE Final    Comment: (NOTE) The Xpert SA Assay (FDA approved for NASAL specimens in patients 70 years of age and older), is one component of a comprehensive surveillance program. It is not intended to diagnose infection nor to guide or monitor treatment. Performed at [facility], [place] [address], [place], [place] [address]   Aerobic/Anaerobic Culture (surgical/deep wound)     Status: None (Preliminary result)   Collection Time: 11/29/17  7:41 AM  Result Value Ref Range Status   Specimen Description ABSCESS RIGHT FOOT  Final   Special Requests NONE  Final   Gram Stain   Final    MODERATE WBC PRESENT, PREDOMINANTLY PMN FEW GRAM POSITIVE COCCI RARE GRAM NEGATIVE RODS RARE GRAM POSITIVE RODS Performed at [facility], [address] [place], [place] [address]    Culture   Final    MODERATE LACTOBACILLUS SPECIES FEW CANDIDA ALBICANS NO ANAEROBES ISOLATED; CULTURE IN PROGRESS FOR 5 DAYS    Report Status PENDING  Incomplete  Aerobic Culture (superficial specimen)     Status: None   Collection Time: 11/29/17  7:46 AM  Result Value Ref Range Status   Specimen Description ABSCESS LEFT FOOT  Final   Special Requests  NONE  Final   Gram Stain   Final    FEW WBC PRESENT, PREDOMINANTLY PMN NO ORGANISMS SEEN Performed at [facility], [place] [address], [place], [place] [address]    Culture RARE LACTOBACILLUS SPECIES  Final   Report Status 12/01/2017 FINAL  Final  Anaerobic culture     Status: None (Preliminary result)   Collection Time: 11/29/17  7:47 AM  Result Value Ref Range Status   Specimen Description ABSCESS LEFT ANKLE  Final   Special Requests NONE   Final   Gram Stain   Final    FEW WBC PRESENT, PREDOMINANTLY PMN NO ORGANISMS SEEN Performed at [facility], [place] [address] [place], [place] [address]    Culture   Final    NO ANAEROBES ISOLATED; CULTURE IN PROGRESS FOR 5 DAYS   Report Status PENDING  Incomplete    RADIOLOGY STUDIES/RESULTS: Mr Foot Left W Wo Contrast  Result Date: 11/22/2017 CLINICAL DATA:  IV drug abuser who injects in his feet. Bilateral foot wounds for 1 week. Chills and malaise. EXAM: MRI OF THE LEFT FOREFOOT WITHOUT AND WITH CONTRAST TECHNIQUE: Multiplanar, multisequence MR imaging of the left forefoot was performed both before and after administration of intravenous contrast. CONTRAST:  8.5 cc Gadavist IV COMPARISON:  Plain films left foot 11/21/2017. FINDINGS: Bones/Joint/Cartilage There is partial visualization of marrow edema in the medial malleolus with postcontrast enhancement. Bone marrow signal is otherwise normal. Ligaments Intact Muscles and Tendons No intramuscular fluid collection is identified. Soft tissues Diffuse subcutaneous edema enhancement are seen about the foot. There is an abscess in the dorsal subcutaneous tissues centered over the bases of the first through third metatarsals. The abscess measures 1.8 cm craniocaudal by 4.6 cm transverse by 4.4 cm long. A second abscess in the subcutaneous tissues measuring 2.5 cm long by 2.3 cm craniocaudal by 0.9 cm transverse is identified along the proximal first metatarsal. This abscess is centered 1.5 cm distal to the first tarsometatarsal joint. A third abscess which is incompletely visualized is seen in subcutaneous tissues 3.2 cm superior to the tip of the medial malleolus. This collection measures approximately 1.5 cm craniocaudal by 0.2 cm transverse. IMPRESSION: Three subcutaneous abscesses are identified as described above. The largest collection is in the dorsal soft tissues at the level of the proximal first and third metatarsals. Edema and enhancement  the medial malleolus consistent with osteomyelitis. Electronically Signed   By: Inge Rise M.D.   On: 11/22/2017 13:01   Dg Foot Complete Left  Result Date: 11/21/2017 CLINICAL DATA:  Bilateral foot pain for 4-5 days, left-greater-than-right, swelling and open sores, the patient is diabetic, and has a smoking history EXAM: LEFT FOOT - COMPLETE 3+ VIEW COMPARISON:  None. FINDINGS: There is soft tissue swelling over the dorsum of the left foot. However no underlying focal demineralization or erosion is seen to indicate osteomyelitis. No acute abnormality is noted. Joint spaces appear normal. No erosion is seen. IMPRESSION: Soft tissue swelling over the dorsum of the foot. No underlying bony abnormality. Electronically Signed   By: Ivar Drape M.D.   On: 11/21/2017 15:30   Dg Foot Complete Right  Result Date: 11/21/2017 CLINICAL DATA:  Bilateral foot pain for 4-5 days, left-greater-than-right with swelling and redness, the patient is diabetic and does have a smoking history EXAM: RIGHT FOOT COMPLETE - 3+ VIEW COMPARISON:  None FINDINGS: There is mild soft tissue swelling over the dorsum of the right foot. No underlying bony demineralization or erosion  is seen to indicate active osteomyelitis. Alignment is normal and joint spaces appear normal. IMPRESSION: Soft tissue swelling over the dorsum. No evidence of active osteomyelitis. Electronically Signed   By: Ivar Drape M.D.   On: 11/21/2017 15:32   [place] Ekg Site Rite  Result Date: 11/22/2017 If Site Rite image not attached, placement could not be confirmed due to current cardiac rhythm.    LOS: 11 days   Lala Lund, MD  [facility]  If 7PM-7AM, please contact night-coverage  Please page via [email or web address] [email or web address]-click on MD name and type text message  12/02/2017, 12:11 PM

## 2017-12-02 NOTE — Progress Notes (Signed)
Physical Therapy Treatment [name] MRN: [id number] DOB: Dec 12, 1980 Today's Date: 12/02/2017    History of Present Illness Pt is a 37 y/o male admitted secondary to sepsis and abscesses on BLE. 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 Pt is s/p bilateral foot debridement with wound vac placement. Pt is also s/p TEE on 9/12 which did not show any vegetation. PMH includes IV drug abuse, DM, HTN, and seizures.     PT Comments    Patient progressing very well with mobility. Ambulating in halls with bilateral wound vacs. No physical assist required today other than line management for VACs. Provided patient with bilateral leg straps to assist with line management. Current POC remains appropriate.   Follow Up Recommendations  Supervision for mobility/OOB;Other (comment)(TBD pending progress )     Equipment Recommendations  None recommended by PT    Recommendations for Other Services       Precautions / Restrictions Precautions Precautions: Fall Required Braces or Orthoses: Other Brace/Splint Other Brace/Splint: per nursing orders post op shoes, however, not mentioned in note.  Restrictions Weight Bearing Restrictions: Yes RLE Weight Bearing: Weight bearing as tolerated LLE Weight Bearing: Weight bearing as tolerated    Mobility  Bed Mobility Overal bed mobility: Needs Assistance Bed Mobility: Supine to Sit;Sit to Supine     Supine to sit: Supervision Sit to supine: Supervision   General bed mobility comments: Supervision for line management.   Transfers Overall transfer level: Needs assistance Equipment used: None Transfers: Sit to/from Stand Sit to Stand: Supervision         General transfer comment: superivsion for safety and line management, no physical assist required  Ambulation/Gait Ambulation/Gait assistance: Supervision Gait Distance (Feet): 210 Feet Assistive device: None Gait Pattern/deviations: Antalgic Gait velocity:  decreased   General Gait Details: supervision for safety and vac management, no physical assist required, steady with ambulation   Stairs             Wheelchair Mobility    Modified Rankin (Stroke Patients Only)       Balance Overall balance assessment: Needs assistance Sitting-balance support: No upper extremity supported;Feet supported Sitting balance-Leahy Scale: Good     Standing balance support: No upper extremity supported Standing balance-Leahy Scale: Good                              Cognition Arousal/Alertness: Awake/alert Behavior During Therapy: WFL for tasks assessed/performed Overall Cognitive Status: Within Functional Limits for tasks assessed                                        Exercises      General Comments        Pertinent Vitals/Pain Pain Assessment: 0-10 Pain Score: 4  Pain Location: bilat feet (L>R) Pain Descriptors / Indicators: Aching Pain Intervention(s): Monitored during session    Home Living                      Prior Function            PT Goals (current goals can now be found in the care plan section) Acute Rehab PT Goals Patient Stated Goal: to go home before 8 weeks is up  PT Goal Formulation: With patient Time For Goal Achievement: 12/13/17 Potential to Achieve Goals: Good Progress towards PT  goals: Progressing toward goals    Frequency    Min 3X/week      PT Plan Current plan remains appropriate    Co-evaluation              AM-PAC PT "6 Clicks" Daily Activity  Outcome Measure  Difficulty turning over in bed (including adjusting bedclothes, sheets and blankets)?: None Difficulty moving from lying on back to sitting on the side of the bed? : A Little Difficulty sitting down on and standing up from a chair with arms (e.g., wheelchair, bedside commode, etc,.)?: A Little Help needed moving to and from a bed to chair (including a wheelchair)?: A Little Help  needed walking in hospital room?: A Little Help needed climbing 3-5 steps with a railing? : A Lot 6 Click Score: 18    End of Session   Activity Tolerance: Patient tolerated treatment well Patient left: in chair;with call bell/phone within reach Nurse Communication: Mobility status;Other (comment) PT Visit Diagnosis: Other abnormalities of gait and mobility (R26.89);Difficulty in walking, not elsewhere classified (R26.2);Pain Pain - part of body: Ankle and joints of foot     Time: 6433-2951 PT Time Calculation (min) (ACUTE ONLY): 18 min  Charges:  $Gait Training: 8-22 mins                     Alben Deeds, PT DPT  Board Certified Neurologic Specialist [place] Pager [phone] Office [place] 12/02/2017, 1:08 PM

## 2017-12-03 LAB — GLUCOSE, CAPILLARY
GLUCOSE-CAPILLARY: 316 mg/dL — AB (ref 70–99)
GLUCOSE-CAPILLARY: 348 mg/dL — AB (ref 70–99)
Glucose-Capillary: 75 mg/dL (ref 70–99)
Glucose-Capillary: 125 mg/dL — ABNORMAL HIGH (ref 70–99)

## 2017-12-03 LAB — CREATININE, SERUM
Creatinine, Ser: 1.24 mg/dL (ref 0.61–1.24)
GFR calc Af Amer: >60 mL/min (ref >60)
GFR calc non Af Amer: >60 mL/min (ref >60)

## 2017-12-03 NOTE — Progress Notes (Signed)
PROGRESS NOTE        [name] Age: 37 y.o. Sex: male Date of Birth: 06/21/1980 Admit Date: 11/21/2017 Admitting Physician Patrecia Pour, MD ZOX:[id number], [name], NP  Brief Narrative: Patient is a 37 y.o. male with history of IVDA (relapse recently) insulin-dependent diabetes, hypertension who presented with sepsis secondary to soft tissue infection with abscesses of bilateral feet, osteomyelitis of the medial malleolus and actinomyces bacteremia.  Patient underwent debridement of bilateral feet, debridement of the left ankle and application of wound VAC on 9/13.  See below for further details.  Subjective:   Patient in bed, appears comfortable, denies any headache, no fever, no chest pain or pressure, no shortness of breath , no abdominal pain. No focal weakness.  Assessment/Plan:  Sepsis secondary to bilateral lower extremity soft tissue infection with abscesses and osteomyelitis of the left medial malleolus along with actinomyces bacteremia: Orthopedics and ID following, currently on IV penicillin G, sepsis physiology has resolved, he is status post debridement by orthopedics on 11/29/2017, continue wound VAC and wound care.  TTE and TEE done this admission are negative for any endocarditis.  He is supposed to get 4 weeks of IV antibiotics, has a PICC line in place.  Due to his history of IV drug use he will be kept in the hospital for monitoring as no safe placement can be arranged.  DKA in a patient with DM type II: Poor control outpatient with A1c of 12.1, DKA has resolved, he is currently on 70/30 insulin along with sliding scale.  Will drop 70/30 dose on 12/01/2017 and monitor closely.  CBG (last 3)  Recent Labs    12/02/17 1659 12/02/17 2127 12/03/17 0828  GLUCAP 195* 207* 75     Mild AKI: Improving with IV fluids continue to hydrate gently, improving.  Discontinue ACE inhibitor for now.  Hypertension: BP controlled-hold ACE  inhibitor due to AKI and monitor.  As needed hydralazine.  Hypothyroidism: Continue levothyroxine  Peripheral neuropathy: Likely related to diabetes, continue Neurontin  Chronic hepatitis C: We will need outpatient follow-up at the ID clinic.  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  Claims he was clean for 2 years before his most recent relapse.  He has a history of numerous soft tissue infections in his bilateral upper extremity-and apparently has a history of fasciotomy in his left upper extremity.  Has been counseled extensively.    DVT Prophylaxis: Prophylactic Lovenox  Code Status: Full code   Family Communication: None at bedside  Disposition Plan: Remain inpatient-given history of IV drug use-not a candidate for outpatient IV antimicrobial therapy, if he cannot be placed into SNF-will require several weeks of inpatient hospitalization.  Antimicrobial agents: Anti-infectives (From admission, onward)   Start     Dose/Rate Route Frequency Ordered Stop   11/27/17 0730  ceFAZolin (ANCEF) IVPB 2g/100 mL premix  Status:  Discontinued     2 g 200 mL/hr over 30 Minutes Intravenous To Short Stay 11/27/17 0716 11/27/17 1317   11/25/17 1400  penicillin G potassium 4 Million Units in dextrose 5 % 250 mL IVPB     4 Million Units 250 mL/hr over 60 Minutes Intravenous Every 4 hours 11/25/17 1254     11/25/17 0800  vancomycin (VANCOCIN) IVPB 1000 mg/200 mL premix  Status:  Discontinued     1,000 mg 200 mL/hr over 60 Minutes Intravenous Every 8 hours 11/25/17 0129 11/25/17 1413   11/24/17 0600  ceFAZolin (ANCEF) IVPB 2g/100 mL premix     2 g 200 mL/hr over 30 Minutes Intravenous On call to O.R. 11/23/17 2306 11/25/17 0559   11/23/17 1400  piperacillin-tazobactam (ZOSYN) IVPB 3.375 g  Status:  Discontinued     3.375 g 12.5 mL/hr over 240 Minutes Intravenous Every 8 hours 11/23/17 1031 11/25/17 1254    11/23/17 0000  metroNIDAZOLE (FLAGYL) IVPB 500 mg  Status:  Discontinued     500 mg 100 mL/hr over 60 Minutes Intravenous Every 8 hours 11/22/17 2312 11/23/17 1014   11/22/17 2315  cefTRIAXone (ROCEPHIN) 2 g in sodium chloride 0.9 % 100 mL IVPB  Status:  Discontinued     2 g 200 mL/hr over 30 Minutes Intravenous Every 24 hours 11/22/17 2312 11/23/17 1014   11/22/17 1400  piperacillin-tazobactam (ZOSYN) IVPB 3.375 g  Status:  Discontinued     3.375 g 12.5 mL/hr over 240 Minutes Intravenous Every 8 hours 11/22/17 1043 11/22/17 2311   11/22/17 1045  vancomycin (VANCOCIN) IVPB 750 mg/150 ml premix  Status:  Discontinued     750 mg 150 mL/hr over 60 Minutes Intravenous Every 12 hours 11/22/17 1043 11/25/17 0129   11/21/17 1830  vancomycin (VANCOCIN) IVPB 750 mg/150 ml premix     750 mg 150 mL/hr over 60 Minutes Intravenous  Once 11/21/17 1816 11/21/17 2030   11/21/17 1715  vancomycin (VANCOCIN) IVPB 1000 mg/200 mL premix     1,000 mg 200 mL/hr over 60 Minutes Intravenous  Once 11/21/17 1710 11/21/17 1914   11/21/17 1715  piperacillin-tazobactam (ZOSYN) IVPB 3.375 g     3.375 g 12.5 mL/hr over 240 Minutes Intravenous  Once 11/21/17 1710 11/21/17 2030      Procedures: 9/13>> debridement bilateral feet, debridement left ankle and application of wound VAC (Dr. Sharol Given)  9/12>> TEE No vegetations are seen. There is 1-2+ central aortic insufficiency.  Mild LVH. Chiari network (normal variant) is present.  9/9>>TTE: - Left ventricle: There was mild concentric hypertrophy. Systolic   function was normal. The estimated ejection fraction was in the   range of 60% to 65%. Left ventricular diastolic function   parameters were normal. - Aortic valve: Transvalvular velocity was within the normal range.   There was no stenosis. There was mild regurgitation. Valve area   (VTI): 2.83 cm^2. Valve area (Vmax): 2.61 cm^2. Valve area   (Vmean): 2.72 cm^2. Regurgitation pressure half-time: 944 ms. - Left  atrium: The atrium was mildly dilated. - Atrial septum: No defect or patent foramen ovale was identified   by color flow Doppler. - Tricuspid valve: There was mild regurgitation. - Pulmonic valve: There was moderate regurgitation. - Pulmonary arteries: Systolic pressure was within the normal   range. PA peak pressure: 30 mm Hg (S). - Pericardium, extracardiac: A trivial pericardial effusion was   identified.  CONSULTS:  ID and orthopedic surgery  Time spent: 25- minutes-Greater than 50% of this time was spent in counseling, explanation of diagnosis, planning of further management, and coordination of care.  MEDICATIONS: Scheduled Meds: . amLODipine  10 mg Oral Daily  . docusate sodium  100 mg Oral BID  . enoxaparin (LOVENOX) injection  40 mg Subcutaneous Daily  . famotidine  20 mg Oral BID  . gabapentin  400 mg Oral TID  . hepatitis b vaccine  1 mL Intramuscular Once  . hydrALAZINE  25 mg Oral Q8H  . insulin aspart  0-5 Units Subcutaneous QHS  . insulin aspart  0-9 Units Subcutaneous TID WC  . insulin aspart protamine- aspart  50 Units Subcutaneous BID WC  . levothyroxine  50 mcg Oral QAC breakfast  . traZODone  50 mg Oral QHS   Continuous Infusions: . pencillin G potassium IV 4 Million Units (12/03/17 1035)   PRN Meds:.acetaminophen, bisacodyl, dextrose, hydrALAZINE, HYDROmorphone (DILAUDID) injection, magnesium citrate, [DISCONTINUED] ondansetron **OR** ondansetron (ZOFRAN) IV, oxyCODONE, polyethylene glycol, sodium chloride flush, traMADol   PHYSICAL EXAM: Vital signs: Vitals:   12/02/17 0510 12/02/17 1528 12/02/17 2129 12/03/17 0535  BP: 138/88 135/80 138/86 133/84  Pulse: 82 (!) 103 (!) 105 87  Resp: 16 16 17 17   Temp:  98.7 F (37.1 C) 98.6 F (37 C) 98.1 F (36.7 C)  TempSrc:  Oral Oral Oral  SpO2: 100% 100% 98% 99%  Weight:      Height:       Filed Weights   11/23/17 0227 11/28/17 0729  Weight: 86.8 kg 86.8 kg   Body mass index is 28.26 kg/m.    Exam  Awake Alert, Oriented X 3, No new F.N deficits, Normal affect [place].AT,PERRAL Supple Neck,No JVD, No cervical lymphadenopathy appriciated.  Symmetrical Chest wall movement, Good air movement bilaterally, CTAB RRR,No Gallops, Rubs or new Murmurs, No Parasternal Heave +ve B.Sounds, Abd Soft, No tenderness, No organomegaly appriciated, No rebound - guarding or rigidity. No Cyanosis, Clubbing or edema, Wound Vac to both feet   I have personally reviewed following labs and imaging studies  LABORATORY DATA: CBC: Recent Labs  Lab 11/30/17 0808 12/01/17 0322  WBC 15.2* 15.8*  HGB 9.4* 9.2*  HCT 30.6* 29.0*  MCV 85.5 85.3  PLT 562* 555*    Basic Metabolic Panel: Recent Labs  Lab 11/29/17 0438 11/30/17 0410 11/30/17 0808 12/01/17 0322 12/02/17 0312  NA  --   --  133* 139 140  K  --   --  4.8 4.0 3.7  CL  --   --  103 109 107  CO2  --   --  23 23 25   GLUCOSE  --   --  484* 105* 114*  BUN  --   --  20 14 14   CREATININE 1.31* 1.45* 1.43* 1.38* 1.28*  CALCIUM  --   --  8.3* 8.5* 8.5*    GFR: Estimated Creatinine Clearance: 86.2 mL/min (A) (by C-G formula based on SCr of 1.28 mg/dL (H)).  Liver Function Tests: No results for input(s): AST, ALT, ALKPHOS, BILITOT, PROT, ALBUMIN in the last 168 hours. No results for input(s): LIPASE, AMYLASE in the last 168 hours. No results for input(s): AMMONIA in the last 168 hours.  Coagulation Profile: No results for input(s): INR, PROTIME in the last 168 hours.  Cardiac Enzymes: No results for input(s): CKTOTAL, CKMB, CKMBINDEX, TROPONINI in the last 168 hours.  BNP (last 3 results) No results for input(s): PROBNP in the last 8760 hours.  HbA1C: No results for input(s): HGBA1C in the last 72 hours.  CBG: Recent Labs  Lab 12/02/17 0814 12/02/17 1205 12/02/17 1659 12/02/17 2127 12/03/17 0828  GLUCAP 124* 169* 195* 207* 75    Lipid Profile: No results for input(s): CHOL, HDL, LDLCALC, TRIG, CHOLHDL, LDLDIRECT in the  last 72 hours.  Thyroid Function Tests: No results for input(s): TSH, T4TOTAL, FREET4, T3FREE, THYROIDAB in the last 72 hours.  Anemia Panel: No results for input(s): VITAMINB12, FOLATE, FERRITIN, TIBC, IRON, RETICCTPCT in the  last 72 hours.  Urine analysis:    Component Value Date/Time   COLORURINE STRAW (A) 11/21/2017 1320   APPEARANCEUR CLEAR 11/21/2017 1320   APPEARANCEUR Clear 04/11/2014 0138   LABSPEC 1.027 11/21/2017 1320   LABSPEC 1.026 04/11/2014 0138   PHURINE 6.0 11/21/2017 1320   GLUCOSEU >=500 (A) 11/21/2017 1320   GLUCOSEU >=500 04/11/2014 0138   HGBUR MODERATE (A) 11/21/2017 1320   BILIRUBINUR NEGATIVE 11/21/2017 1320   BILIRUBINUR neg 02/18/2017 0946   BILIRUBINUR Negative 04/11/2014 0138   KETONESUR 20 (A) 11/21/2017 1320   PROTEINUR 30 (A) 11/21/2017 1320   UROBILINOGEN 0.2 02/18/2017 0946   UROBILINOGEN 0.2 12/10/2013 1221   NITRITE NEGATIVE 11/21/2017 1320   LEUKOCYTESUR NEGATIVE 11/21/2017 1320   LEUKOCYTESUR Negative 04/11/2014 0138    Sepsis Labs: Lactic Acid, Venous    Component Value Date/Time   LATICACIDVEN 1.77 11/21/2017 1957    MICROBIOLOGY: Recent Results (from the past 240 hour(s))  Aerobic Culture (superficial specimen)     Status: None   Collection Time: 11/23/17  1:35 PM  Result Value Ref Range Status   Specimen Description WOUND FOOT LEFT  Final   Special Requests Normal  Final   Gram Stain   Final    MODERATE WBC PRESENT, PREDOMINANTLY PMN MODERATE GRAM POSITIVE RODS FEW GRAM NEGATIVE RODS RARE GRAM POSITIVE COCCI IN PAIRS    Culture   Final    FEW STREPTOCOCCUS GROUP F MODERATE LACTOBACILLUS SPECIES Standardized susceptibility testing for this organism is not available. Performed at [facility], [place] [address] [place], [place] [address]    Report Status 11/27/2017 FINAL  Final  Surgical pcr screen     Status: None   Collection Time: 11/24/17 12:03 AM  Result Value Ref Range Status   MRSA, PCR NEGATIVE NEGATIVE  Final   Staphylococcus aureus NEGATIVE NEGATIVE Final    Comment: (NOTE) The Xpert SA Assay (FDA approved for NASAL specimens in patients 33 years of age and older), is one component of a comprehensive surveillance program. It is not intended to diagnose infection nor to guide or monitor treatment. Performed at [facility], [place] [address], [place], [place] [address]   Aerobic/Anaerobic Culture (surgical/deep wound)     Status: None (Preliminary result)   Collection Time: 11/29/17  7:41 AM  Result Value Ref Range Status   Specimen Description ABSCESS RIGHT FOOT  Final   Special Requests NONE  Final   Gram Stain   Final    MODERATE WBC PRESENT, PREDOMINANTLY PMN FEW GRAM POSITIVE COCCI RARE GRAM NEGATIVE RODS RARE GRAM POSITIVE RODS Performed at [facility], [address], [place], [place] [address]    Culture   Final    MODERATE LACTOBACILLUS SPECIES FEW CANDIDA ALBICANS NO ANAEROBES ISOLATED; CULTURE IN PROGRESS FOR 5 DAYS    Report Status PENDING  Incomplete  Aerobic Culture (superficial specimen)     Status: None   Collection Time: 11/29/17  7:46 AM  Result Value Ref Range Status   Specimen Description ABSCESS LEFT FOOT  Final   Special Requests NONE  Final   Gram Stain   Final    FEW WBC PRESENT, PREDOMINANTLY PMN NO ORGANISMS SEEN Performed at [facility], [place] [address], [place],  [address]    Culture RARE LACTOBACILLUS SPECIES  Final   Report Status 12/01/2017 FINAL  Final  Anaerobic culture     Status: None (Preliminary result)   Collection Time: 11/29/17  7:47 AM  Result Value Ref Range Status   Specimen Description ABSCESS LEFT ANKLE  Final   Special Requests NONE  Final   Gram Stain   Final    FEW WBC PRESENT, PREDOMINANTLY PMN NO ORGANISMS SEEN Performed at [facility], [address] [place], [place] [address]    Culture   Final    NO ANAEROBES ISOLATED; CULTURE IN PROGRESS FOR 5 DAYS   Report Status PENDING   Incomplete    RADIOLOGY STUDIES/RESULTS: Mr Foot Left W Wo Contrast  Result Date: 11/22/2017 CLINICAL DATA:  IV drug abuser who injects in his feet. Bilateral foot wounds for 1 week. Chills and malaise. EXAM: MRI OF THE LEFT FOREFOOT WITHOUT AND WITH CONTRAST TECHNIQUE: Multiplanar, multisequence MR imaging of the left forefoot was performed both before and after administration of intravenous contrast. CONTRAST:  8.5 cc Gadavist IV COMPARISON:  Plain films left foot 11/21/2017. FINDINGS: Bones/Joint/Cartilage There is partial visualization of marrow edema in the medial malleolus with postcontrast enhancement. Bone marrow signal is otherwise normal. Ligaments Intact Muscles and Tendons No intramuscular fluid collection is identified. Soft tissues Diffuse subcutaneous edema enhancement are seen about the foot. There is an abscess in the dorsal subcutaneous tissues centered over the bases of the first through third metatarsals. The abscess measures 1.8 cm craniocaudal by 4.6 cm transverse by 4.4 cm long. A second abscess in the subcutaneous tissues measuring 2.5 cm long by 2.3 cm craniocaudal by 0.9 cm transverse is identified along the proximal first metatarsal. This abscess is centered 1.5 cm distal to the first tarsometatarsal joint. A third abscess which is incompletely visualized is seen in subcutaneous tissues 3.2 cm superior to the tip of the medial malleolus. This collection measures approximately 1.5 cm craniocaudal by 0.2 cm transverse. IMPRESSION: Three subcutaneous abscesses are identified as described above. The largest collection is in the dorsal soft tissues at the level of the proximal first and third metatarsals. Edema and enhancement the medial malleolus consistent with osteomyelitis. Electronically Signed   By: Inge Rise M.D.   On: 11/22/2017 13:01   Dg Foot Complete Left  Result Date: 11/21/2017 CLINICAL DATA:  Bilateral foot pain for 4-5 days, left-greater-than-right, swelling and  open sores, the patient is diabetic, and has a smoking history EXAM: LEFT FOOT - COMPLETE 3+ VIEW COMPARISON:  None. FINDINGS: There is soft tissue swelling over the dorsum of the left foot. However no underlying focal demineralization or erosion is seen to indicate osteomyelitis. No acute abnormality is noted. Joint spaces appear normal. No erosion is seen. IMPRESSION: Soft tissue swelling over the dorsum of the foot. No underlying bony abnormality. Electronically Signed   By: Ivar Drape M.D.   On: 11/21/2017 15:30   Dg Foot Complete Right  Result Date: 11/21/2017 CLINICAL DATA:  Bilateral foot pain for 4-5 days, left-greater-than-right with swelling and redness, the patient is diabetic and does have a smoking history EXAM: RIGHT FOOT COMPLETE - 3+ VIEW COMPARISON:  None FINDINGS: There is mild soft tissue swelling over the dorsum of the right foot. No underlying bony demineralization or erosion is seen to indicate active osteomyelitis. Alignment is normal and joint spaces appear normal. IMPRESSION: Soft tissue swelling over the dorsum. No evidence of active osteomyelitis. Electronically Signed   By: Ivar Drape M.D.   On: 11/21/2017 15:32   [place] Ekg Site Rite  Result Date: 11/22/2017 If Site Rite image not attached, placement could not be confirmed due to current cardiac rhythm.    LOS: 12  days   Lala Lund, MD  [facility]  If 7PM-7AM, please contact night-coverage  Please page via [email or web address]-click on MD name and type text message  12/03/2017, 10:56 AM

## 2017-12-04 LAB — COMPREHENSIVE METABOLIC PANEL
ALK PHOS: 90 U/L (ref 38–126)
ALT: 23 U/L (ref 0–44)
AST: 22 U/L (ref 15–41)
Albumin: 2.6 g/dL — ABNORMAL LOW (ref 3.5–5.0)
Anion gap: 12 (ref 5–15)
BUN: 15 mg/dL (ref 6–20)
CALCIUM: 9.2 mg/dL (ref 8.9–10.3)
CO2: 24 mmol/L (ref 22–32)
CREATININE: 1.45 mg/dL — AB (ref 0.61–1.24)
Chloride: 105 mmol/L (ref 98–111)
Glucose, Bld: 112 mg/dL — ABNORMAL HIGH (ref 70–99)
Potassium: 4 mmol/L (ref 3.5–5.1)
Sodium: 141 mmol/L (ref 135–145)
TOTAL PROTEIN: 6.1 g/dL — AB (ref 6.5–8.1)
Total Bilirubin: 0.5 mg/dL (ref 0.3–1.2)

## 2017-12-04 LAB — CBC
HCT: 31.5 % — ABNORMAL LOW (ref 39.0–52.0)
HEMOGLOBIN: 9.9 g/dL — AB (ref 13.0–17.0)
MCH: 27 pg (ref 26.0–34.0)
MCHC: 31.4 g/dL (ref 30.0–36.0)
MCV: 86.1 fL (ref 78.0–100.0)
PLATELETS: 573 10*3/uL — AB (ref 150–400)
RBC: 3.66 MIL/uL — ABNORMAL LOW (ref 4.22–5.81)
RDW: 14.4 % (ref 11.5–15.5)
WBC: 13.6 10*3/uL — AB (ref 4.0–10.5)

## 2017-12-04 LAB — GLUCOSE, CAPILLARY
GLUCOSE-CAPILLARY: 84 mg/dL (ref 70–99)
GLUCOSE-CAPILLARY: 245 mg/dL — AB (ref 70–99)
Glucose-Capillary: 186 mg/dL — ABNORMAL HIGH (ref 70–99)
Glucose-Capillary: 307 mg/dL — ABNORMAL HIGH (ref 70–99)
Glucose-Capillary: 139 mg/dL — ABNORMAL HIGH (ref 70–99)

## 2017-12-04 LAB — OSMOLALITY, URINE: OSMOLALITY UR: 205 mosm/kg — AB (ref 300–900)

## 2017-12-04 LAB — ANAEROBIC CULTURE

## 2017-12-04 LAB — AEROBIC/ANAEROBIC CULTURE (SURGICAL/DEEP WOUND)

## 2017-12-04 LAB — AEROBIC/ANAEROBIC CULTURE W GRAM STAIN (SURGICAL/DEEP WOUND)

## 2017-12-04 LAB — CREATININE, URINE, RANDOM: CREATININE, URINE: 30.61 mg/dL

## 2017-12-04 LAB — SODIUM, URINE, RANDOM: Sodium, Ur: 58 mmol/L

## 2017-12-04 MED ORDER — FUROSEMIDE 10 MG/ML IJ SOLN
40.0000 mg | Freq: Once | INTRAMUSCULAR | Status: AC
  Administered 2017-12-04: 40 mg via INTRAVENOUS
  Filled 2017-12-04: qty 4

## 2017-12-04 MED ORDER — SODIUM CHLORIDE 0.9 % IV SOLN: INTRAVENOUS | Status: DC

## 2017-12-04 NOTE — Progress Notes (Addendum)
Inpatient Diabetes Program Recommendations  AACE/ADA: New Consensus Statement on Inpatient Glycemic Control (2015)  Target Ranges:  Prepandial:   less than 140 mg/dL      Peak postprandial:   less than 180 mg/dL (1-2 hours)      Critically ill patients:  140 - 180 mg/dL   Results for NICCOLO, BURGGRAF (MRN [id number]) as of 12/04/2017 10:08  Ref. Range 12/03/2017 08:28 12/03/2017 11:09 12/03/2017 16:43 12/03/2017 20:55 12/04/2017 07:47 12/04/2017 09:26  Glucose-Capillary Latest Ref Range: 70 - 99 mg/dL 75 125 (H) 316 (H) 348 (H) 84 186 (H)   Review of Glycemic Control  Diabetes history: DM 2 /Outpatient Diabetes medications: 40 units qam, 30 units qpm, Regular insulin SSI tid Current orders for Inpatient glycemic control: 70/30 50 units BID, Novolog 0-9 units tid, Novolog 0-5 units qhs  Inpatient Diabetes Program Recommendations:    Fasting glucose trending almost on the low side. However glucose trends increase significant;y throughout the day in the 300 range.  Consider increasing 70/30 am dose  Consider decreasing 70/30 pm dose slightly.  Thanks,  Tama Headings RN, MSN, BC-ADM Inpatient Diabetes Coordinator Team Pager [phone] (8a-5p)

## 2017-12-04 NOTE — Progress Notes (Signed)
PROGRESS NOTE        [name] Age: 37 y.o. Sex: male Date of Birth: [birth year] Admit Date: 11/21/2017 Admitting Physician Patrecia Pour, MD [id number], [name], NP  Brief Narrative:   Patient is a 37 y.o. male with history of IVDA (relapse recently) insulin-dependent diabetes, hypertension who presented with sepsis secondary to soft tissue infection with abscesses of bilateral feet, osteomyelitis of the medial malleolus and actinomyces bacteremia.  Patient underwent debridement of bilateral feet, debridement of the left ankle and application of wound VAC on 9/13.  See below for further details.  Subjective:   Patient in bed, appears comfortable, denies any headache, no fever, no chest pain or pressure, no shortness of breath , no abdominal pain. No focal weakness.   Assessment/Plan:  Sepsis secondary to bilateral lower extremity soft tissue infection with abscesses and osteomyelitis of the left medial malleolus along with actinomyces bacteremia: Orthopedics and ID following, currently on IV penicillin G, sepsis physiology has resolved, he is status post debridement by orthopedics on 11/29/2017, continue wound VAC and wound care.  TTE and TEE done this admission are negative for any endocarditis.  He is supposed to get 4 weeks of IV antibiotics, has a PICC line in place.  Due to his history of IV drug use he will be kept in the hospital for monitoring as no safe placement can be arranged.  DKA in a patient with DM type II: Poor control outpatient with A1c of 12.1, DKA has resolved, he is currently on 70/30 insulin along with sliding scale.  Will drop 70/30 dose on 12/01/2017 and monitor closely.  CBG (last 3)  Recent Labs    12/03/17 2055 12/04/17 0747 12/04/17 0926  GLUCAP 348* 84 186*     CKD 3: Appears his baseline creatinine is anywhere between 1.3-1.4, monitor closely.  For now holding ACE inhibitor.  Hypertension: BP  controlled-hold ACE inhibitor due to AKI and monitor.  As needed hydralazine.  Hypothyroidism: Continue levothyroxine  Peripheral neuropathy: Likely related to diabetes, continue Neurontin  Chronic hepatitis C: We will need outpatient follow-up at the ID clinic.  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  Claims he was clean for 2 years before his most recent relapse.  He has a history of numerous soft tissue infections in his bilateral upper extremity-and apparently has a history of fasciotomy in his left upper extremity.  Has been counseled extensively.    DVT Prophylaxis: Prophylactic Lovenox  Code Status: Full code   Family Communication: None at bedside  Disposition Plan: Remain inpatient-given history of IV drug use-not a candidate for outpatient IV antimicrobial therapy, if he cannot be placed into SNF-will require several weeks of inpatient hospitalization.  Antimicrobial agents: Anti-infectives (From admission, onward)   Start     Dose/Rate Route Frequency Ordered Stop   11/27/17 0730  ceFAZolin (ANCEF) IVPB 2g/100 mL premix  Status:  Discontinued     2 g 200 mL/hr over 30 Minutes Intravenous To Short Stay 11/27/17 0716 11/27/17 1317   11/25/17 1400  penicillin G potassium 4 Million Units in dextrose 5 % 250 mL IVPB     4 Million Units 250 mL/hr over 60 Minutes Intravenous Every 4 hours 11/25/17 1254     11/25/17 0800  vancomycin (VANCOCIN) IVPB 1000 mg/200 mL  premix  Status:  Discontinued     1,000 mg 200 mL/hr over 60 Minutes Intravenous Every 8 hours 11/25/17 0129 11/25/17 1413   11/24/17 0600  ceFAZolin (ANCEF) IVPB 2g/100 mL premix     2 g 200 mL/hr over 30 Minutes Intravenous On call to O.R. 11/23/17 2306 11/25/17 0559   11/23/17 1400  piperacillin-tazobactam (ZOSYN) IVPB 3.375 g  Status:  Discontinued     3.375 g 12.5 mL/hr over 240 Minutes Intravenous Every 8 hours 11/23/17  1031 11/25/17 1254   11/23/17 0000  metroNIDAZOLE (FLAGYL) IVPB 500 mg  Status:  Discontinued     500 mg 100 mL/hr over 60 Minutes Intravenous Every 8 hours 11/22/17 2312 11/23/17 1014   11/22/17 2315  cefTRIAXone (ROCEPHIN) 2 g in sodium chloride 0.9 % 100 mL IVPB  Status:  Discontinued     2 g 200 mL/hr over 30 Minutes Intravenous Every 24 hours 11/22/17 2312 11/23/17 1014   11/22/17 1400  piperacillin-tazobactam (ZOSYN) IVPB 3.375 g  Status:  Discontinued     3.375 g 12.5 mL/hr over 240 Minutes Intravenous Every 8 hours 11/22/17 1043 11/22/17 2311   11/22/17 1045  vancomycin (VANCOCIN) IVPB 750 mg/150 ml premix  Status:  Discontinued     750 mg 150 mL/hr over 60 Minutes Intravenous Every 12 hours 11/22/17 1043 11/25/17 0129   11/21/17 1830  vancomycin (VANCOCIN) IVPB 750 mg/150 ml premix     750 mg 150 mL/hr over 60 Minutes Intravenous  Once 11/21/17 1816 11/21/17 2030   11/21/17 1715  vancomycin (VANCOCIN) IVPB 1000 mg/200 mL premix     1,000 mg 200 mL/hr over 60 Minutes Intravenous  Once 11/21/17 1710 11/21/17 1914   11/21/17 1715  piperacillin-tazobactam (ZOSYN) IVPB 3.375 g     3.375 g 12.5 mL/hr over 240 Minutes Intravenous  Once 11/21/17 1710 11/21/17 2030      Procedures:  9/13>> debridement bilateral feet, debridement left ankle and application of wound VAC (Dr. Sharol Given)  9/12>> TEE  - No vegetations are seen. There is 1-2+ central aortic insufficiency. Mild LVH. Chiari network (normal variant) is present.  9/9>>TTE:  - Left ventricle: There was mild concentric hypertrophy. Systolic function was normal. The estimated ejection fraction was in the range of 60% to 65%. Left ventricular diastolic function parameters were normal. - Aortic valve: Transvalvular velocity was within the normal range. There was no stenosis. There was mild regurgitation. Valve area (VTI): 2.83 cm^2. Valve area (Vmax): 2.61 cm^2. Valve area (Vmean): 2.72 cm^2. Regurgitation pressure half-time: 944 ms. -  Left atrium: The atrium was mildly dilated. - Atrial septum: No defect or patent foramen ovale was identified  by color flow Doppler. - Tricuspid valve: There was mild regurgitation. - Pulmonic valve: There was moderate regurgitation. - Pulmonary arteries: Systolic pressure was within the normal  range. PA peak pressure: 30 mm Hg (S). - Pericardium, extracardiac: A trivial pericardial effusion was  identified.  CONSULTS:  ID and orthopedic surgery  Time spent: 25- minutes-Greater than 50% of this time was spent in counseling, explanation of diagnosis, planning of further management, and coordination of care.  MEDICATIONS: Scheduled Meds: . amLODipine  10 mg Oral Daily  . docusate sodium  100 mg Oral BID  . enoxaparin (LOVENOX) injection  40 mg Subcutaneous Daily  . famotidine  20 mg Oral BID  . furosemide  40 mg Intravenous Once  . gabapentin  400 mg Oral TID  . hepatitis b vaccine  1 mL Intramuscular Once  . insulin  aspart  0-5 Units Subcutaneous QHS  . insulin aspart  0-9 Units Subcutaneous TID WC  . insulin aspart protamine- aspart  50 Units Subcutaneous BID WC  . levothyroxine  50 mcg Oral QAC breakfast  . traZODone  50 mg Oral QHS   Continuous Infusions: . pencillin G potassium IV 4 Million Units (12/04/17 0935)   PRN Meds:.acetaminophen, bisacodyl, dextrose, hydrALAZINE, HYDROmorphone (DILAUDID) injection, magnesium citrate, [DISCONTINUED] ondansetron **OR** ondansetron (ZOFRAN) IV, oxyCODONE, polyethylene glycol, sodium chloride flush, traMADol   PHYSICAL EXAM: Vital signs: Vitals:   12/03/17 0535 12/03/17 1433 12/03/17 2057 12/04/17 0528  BP: 133/84 (!) 152/91 118/83 112/76  Pulse: 87  (!) 111 92  Resp: 17  17 17   Temp: 98.1 F (36.7 C)   (!) 97.5 F (36.4 C)  TempSrc: Oral   Oral  SpO2: 99%  97% 97%  Weight:      Height:       Filed Weights   11/23/17 0227 11/28/17 0729  Weight: 86.8 kg 86.8 kg   Body mass index is 28.26 kg/m.   Exam  Awake Alert,  Oriented X 3, No new F.N deficits, Normal affect [place].AT,PERRAL Supple Neck,No JVD, No cervical lymphadenopathy appriciated.  Symmetrical Chest wall movement, Good air movement bilaterally, CTAB RRR,No Gallops, Rubs or new Murmurs, No Parasternal Heave +ve B.Sounds, Abd Soft, No tenderness, No organomegaly appriciated, No rebound - guarding or rigidity. No Cyanosis, Clubbing or edema, Wound Vac to both feet   I have personally reviewed following labs and imaging studies  LABORATORY DATA: CBC: Recent Labs  Lab 11/30/17 0808 12/01/17 0322 12/04/17 0421  WBC 15.2* 15.8* 13.6*  HGB 9.4* 9.2* 9.9*  HCT 30.6* 29.0* 31.5*  MCV 85.5 85.3 86.1  PLT 562* 555* 573*    Basic Metabolic Panel: Recent Labs  Lab 11/30/17 0808 12/01/17 0322 12/02/17 0312 12/03/17 1225 12/04/17 0421  NA 133* 139 140  --  141  K 4.8 4.0 3.7  --  4.0  CL 103 109 107  --  105  CO2 23 23 25   --  24  GLUCOSE 484* 105* 114*  --  112*  BUN 20 14 14   --  15  CREATININE 1.43* 1.38* 1.28* 1.24 1.45*  CALCIUM 8.3* 8.5* 8.5*  --  9.2    GFR: Estimated Creatinine Clearance: 76.1 mL/min (A) (by C-G formula based on SCr of 1.45 mg/dL (H)).  Liver Function Tests: Recent Labs  Lab 12/04/17 0421  AST 22  ALT 23  ALKPHOS 90  BILITOT 0.5  PROT 6.1*  ALBUMIN 2.6*   No results for input(s): LIPASE, AMYLASE in the last 168 hours. No results for input(s): AMMONIA in the last 168 hours.  Coagulation Profile: No results for input(s): INR, PROTIME in the last 168 hours.  Cardiac Enzymes: No results for input(s): CKTOTAL, CKMB, CKMBINDEX, TROPONINI in the last 168 hours.  BNP (last 3 results) No results for input(s): PROBNP in the last 8760 hours.  HbA1C: No results for input(s): HGBA1C in the last 72 hours.  CBG: Recent Labs  Lab 12/03/17 1109 12/03/17 1643 12/03/17 2055 12/04/17 0747 12/04/17 0926  GLUCAP 125* 316* 348* 84 186*    Lipid Profile: No results for input(s): CHOL, HDL, LDLCALC, TRIG,  CHOLHDL, LDLDIRECT in the last 72 hours.  Thyroid Function Tests: No results for input(s): TSH, T4TOTAL, FREET4, T3FREE, THYROIDAB in the last 72 hours.  Anemia Panel: No results for input(s): VITAMINB12, FOLATE, FERRITIN, TIBC, IRON, RETICCTPCT in the last 72 hours.  Urine  analysis:    Component Value Date/Time   COLORURINE STRAW (A) 11/21/2017 1320   APPEARANCEUR CLEAR 11/21/2017 1320   APPEARANCEUR Clear 04/11/2014 0138   LABSPEC 1.027 11/21/2017 1320   LABSPEC 1.026 04/11/2014 0138   PHURINE 6.0 11/21/2017 1320   GLUCOSEU >=500 (A) 11/21/2017 1320   GLUCOSEU >=500 04/11/2014 0138   HGBUR MODERATE (A) 11/21/2017 1320   BILIRUBINUR NEGATIVE 11/21/2017 1320   BILIRUBINUR neg 02/18/2017 0946   BILIRUBINUR Negative 04/11/2014 0138   KETONESUR 20 (A) 11/21/2017 1320   PROTEINUR 30 (A) 11/21/2017 1320   UROBILINOGEN 0.2 02/18/2017 0946   UROBILINOGEN 0.2 12/10/2013 1221   NITRITE NEGATIVE 11/21/2017 1320   LEUKOCYTESUR NEGATIVE 11/21/2017 1320   LEUKOCYTESUR Negative 04/11/2014 0138    Sepsis Labs: Lactic Acid, Venous    Component Value Date/Time   LATICACIDVEN 1.77 11/21/2017 1957    MICROBIOLOGY: Recent Results (from the past 240 hour(s))  Aerobic/Anaerobic Culture (surgical/deep wound)     Status: None (Preliminary result)   Collection Time: 11/29/17  7:41 AM  Result Value Ref Range Status   Specimen Description ABSCESS RIGHT FOOT  Final   Special Requests NONE  Final   Gram Stain   Final    MODERATE WBC PRESENT, PREDOMINANTLY PMN FEW GRAM POSITIVE COCCI RARE GRAM NEGATIVE RODS RARE GRAM POSITIVE RODS Performed at [facility], [place] [address] [place], [place] [address]    Culture   Final    MODERATE LACTOBACILLUS SPECIES FEW CANDIDA ALBICANS NO ANAEROBES ISOLATED; CULTURE IN PROGRESS FOR 5 DAYS    Report Status PENDING  Incomplete  Aerobic Culture (superficial specimen)     Status: None   Collection Time: 11/29/17  7:46 AM  Result Value Ref Range  Status   Specimen Description ABSCESS LEFT FOOT  Final   Special Requests NONE  Final   Gram Stain   Final    FEW WBC PRESENT, PREDOMINANTLY PMN NO ORGANISMS SEEN Performed at [facility], [place] [address] [place], [place] [address]    Culture RARE LACTOBACILLUS SPECIES  Final   Report Status 12/01/2017 FINAL  Final  Anaerobic culture     Status: None (Preliminary result)   Collection Time: 11/29/17  7:47 AM  Result Value Ref Range Status   Specimen Description ABSCESS LEFT ANKLE  Final   Special Requests NONE  Final   Gram Stain   Final    FEW WBC PRESENT, PREDOMINANTLY PMN NO ORGANISMS SEEN Performed at [facility], [place] [address] [place], [place] [address]    Culture   Final    NO ANAEROBES ISOLATED; CULTURE IN PROGRESS FOR 5 DAYS   Report Status PENDING  Incomplete    RADIOLOGY STUDIES/RESULTS: Mr Foot Left W Wo Contrast  Result Date: 11/22/2017 CLINICAL DATA:  IV drug abuser who injects in his feet. Bilateral foot wounds for 1 week. Chills and malaise. EXAM: MRI OF THE LEFT FOREFOOT WITHOUT AND WITH CONTRAST TECHNIQUE: Multiplanar, multisequence MR imaging of the left forefoot was performed both before and after administration of intravenous contrast. CONTRAST:  8.5 cc Gadavist IV COMPARISON:  Plain films left foot 11/21/2017. FINDINGS: Bones/Joint/Cartilage There is partial visualization of marrow edema in the medial malleolus with postcontrast enhancement. Bone marrow signal is otherwise normal. Ligaments Intact Muscles and Tendons No intramuscular fluid collection is identified. Soft tissues Diffuse subcutaneous edema enhancement are seen about the foot. There is an abscess in the dorsal subcutaneous tissues centered over the bases of the first through third  metatarsals. The abscess measures 1.8 cm craniocaudal by 4.6 cm transverse by 4.4 cm long. A second abscess in the subcutaneous tissues measuring 2.5 cm long by 2.3 cm craniocaudal by 0.9 cm transverse is  identified along the proximal first metatarsal. This abscess is centered 1.5 cm distal to the first tarsometatarsal joint. A third abscess which is incompletely visualized is seen in subcutaneous tissues 3.2 cm superior to the tip of the medial malleolus. This collection measures approximately 1.5 cm craniocaudal by 0.2 cm transverse. IMPRESSION: Three subcutaneous abscesses are identified as described above. The largest collection is in the dorsal soft tissues at the level of the proximal first and third metatarsals. Edema and enhancement the medial malleolus consistent with osteomyelitis. Electronically Signed   By: Inge Rise M.D.   On: 11/22/2017 13:01   Dg Foot Complete Left  Result Date: 11/21/2017 CLINICAL DATA:  Bilateral foot pain for 4-5 days, left-greater-than-right, swelling and open sores, the patient is diabetic, and has a smoking history EXAM: LEFT FOOT - COMPLETE 3+ VIEW COMPARISON:  None. FINDINGS: There is soft tissue swelling over the dorsum of the left foot. However no underlying focal demineralization or erosion is seen to indicate osteomyelitis. No acute abnormality is noted. Joint spaces appear normal. No erosion is seen. IMPRESSION: Soft tissue swelling over the dorsum of the foot. No underlying bony abnormality. Electronically Signed   By: Ivar Drape M.D.   On: 11/21/2017 15:30   Dg Foot Complete Right  Result Date: 11/21/2017 CLINICAL DATA:  Bilateral foot pain for 4-5 days, left-greater-than-right with swelling and redness, the patient is diabetic and does have a smoking history EXAM: RIGHT FOOT COMPLETE - 3+ VIEW COMPARISON:  None FINDINGS: There is mild soft tissue swelling over the dorsum of the right foot. No underlying bony demineralization or erosion is seen to indicate active osteomyelitis. Alignment is normal and joint spaces appear normal. IMPRESSION: Soft tissue swelling over the dorsum. No evidence of active osteomyelitis. Electronically Signed   By: Ivar Drape  M.D.   On: 11/21/2017 15:32   [place] Ekg Site Rite  Result Date: 11/22/2017 If Site Rite image not attached, placement could not be confirmed due to current cardiac rhythm.    LOS: 13 days   Lala Lund, MD  [facility]  If 7PM-7AM, please contact night-coverage  Please page via [email or web address] [email or web address]-click on MD name and type text message  12/04/2017, 10:31 AM

## 2017-12-04 NOTE — Progress Notes (Signed)
Physical Therapy Treatment [name] MRN: [id number] DOB: [birth year] Today's Date: 12/04/2017    History of Present Illness Pt is a 37 y/o male admitted secondary to sepsis and abscesses on BLE. 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 Pt is s/p bilateral foot debridement with wound vac placement. Pt is also s/p TEE on 9/12 which did not show any vegetation. PMH includes IV drug abuse, DM, HTN, and seizures.     PT Comments    Patient seen for mobility progression. Mobilizing very well at this time. Reports some pain at Vac site but otherwise feeling good. Current POC remains appropriate. Will see once again after Vacs removed for stair negotiation and further mobility.   Follow Up Recommendations  Supervision for mobility/OOB;Other (comment)(TBD pending progress )     Equipment Recommendations  None recommended by PT    Recommendations for Other Services       Precautions / Restrictions Precautions Precautions: Fall Required Braces or Orthoses: Other Brace/Splint Other Brace/Splint: per nursing orders post op shoes, however, not mentioned in note.  Restrictions Weight Bearing Restrictions: No RLE Weight Bearing: Weight bearing as tolerated LLE Weight Bearing: Weight bearing as tolerated    Mobility  Bed Mobility Overal bed mobility: Independent                Transfers Overall transfer level: Independent Equipment used: None Transfers: Sit to/from Stand Sit to Stand: Supervision         General transfer comment: superivsion for safety and line management, no physical assist required  Ambulation/Gait Ambulation/Gait assistance: Supervision Gait Distance (Feet): 250 Feet Assistive device: None Gait Pattern/deviations: Antalgic Gait velocity: decreased   General Gait Details: Supervision for line management   Stairs             Wheelchair Mobility    Modified Rankin (Stroke Patients Only)       Balance  Overall balance assessment: Needs assistance Sitting-balance support: No upper extremity supported;Feet supported Sitting balance-Leahy Scale: Good     Standing balance support: No upper extremity supported Standing balance-Leahy Scale: Good                              Cognition Arousal/Alertness: Awake/alert Behavior During Therapy: WFL for tasks assessed/performed Overall Cognitive Status: Within Functional Limits for tasks assessed                                        Exercises      General Comments        Pertinent Vitals/Pain Pain Assessment: 0-10 Pain Score: 4  Pain Location: bilat feet (L>R) Pain Descriptors / Indicators: Aching Pain Intervention(s): Monitored during session    Home Living                      Prior Function            PT Goals (current goals can now be found in the care plan section) Acute Rehab PT Goals Patient Stated Goal: to go home before 8 weeks is up  PT Goal Formulation: With patient Time For Goal Achievement: 12/13/17 Potential to Achieve Goals: Good Progress towards PT goals: Progressing toward goals    Frequency    Min 3X/week      PT Plan Current plan remains appropriate    Co-evaluation  AM-PAC PT "6 Clicks" Daily Activity  Outcome Measure  Difficulty turning over in bed (including adjusting bedclothes, sheets and blankets)?: None Difficulty moving from lying on back to sitting on the side of the bed? : None Difficulty sitting down on and standing up from a chair with arms (e.g., wheelchair, bedside commode, etc,.)?: None Help needed moving to and from a bed to chair (including a wheelchair)?: A Little Help needed walking in hospital room?: A Little Help needed climbing 3-5 steps with a railing? : A Lot 6 Click Score: 20    End of Session Equipment Utilized During Treatment: (bilateral wound vacs) Activity Tolerance: Patient tolerated treatment  well Patient left: in chair;with call bell/phone within reach Nurse Communication: Mobility status;Other (comment) PT Visit Diagnosis: Other abnormalities of gait and mobility (R26.89);Difficulty in walking, not elsewhere classified (R26.2);Pain Pain - Right/Left: (bilateral ) Pain - part of body: Ankle and joints of foot     Time: 2836-6294 PT Time Calculation (min) (ACUTE ONLY): 12 min  Charges:  $Gait Training: 8-22 mins                     Alben Deeds, PT DPT  Board Certified Neurologic Specialist [place] Pager [phone] Office [phone]    Duncan Dull 12/04/2017, 3:48 PM

## 2017-12-05 ENCOUNTER — Inpatient Hospital Stay: Payer: Self-pay

## 2017-12-05 LAB — GLUCOSE, CAPILLARY
Glucose-Capillary: 179 mg/dL — ABNORMAL HIGH (ref 70–99)
Glucose-Capillary: 173 mg/dL — ABNORMAL HIGH (ref 70–99)
Glucose-Capillary: 369 mg/dL — ABNORMAL HIGH (ref 70–99)
Glucose-Capillary: 345 mg/dL — ABNORMAL HIGH (ref 70–99)

## 2017-12-05 LAB — BASIC METABOLIC PANEL
Anion gap: 10 (ref 5–15)
BUN: 20 mg/dL (ref 6–20)
CHLORIDE: 104 mmol/L (ref 98–111)
CO2: 24 mmol/L (ref 22–32)
CREATININE: 1.38 mg/dL — AB (ref 0.61–1.24)
Calcium: 8.9 mg/dL (ref 8.9–10.3)
GFR calc Af Amer: >60 mL/min (ref >60)
GFR calc non Af Amer: >60 mL/min (ref >60)
Glucose, Bld: 96 mg/dL (ref 70–99)
Potassium: 4 mmol/L (ref 3.5–5.1)
SODIUM: 138 mmol/L (ref 135–145)

## 2017-12-05 MED ORDER — HYDRALAZINE HCL 50 MG PO TABS
50.0000 mg | ORAL_TABLET | Freq: Three times a day (TID) | ORAL | Status: DC
  Administered 2017-12-05 – 2017-12-10 (×15): 50 mg via ORAL
  Filled 2017-12-05 (×14): qty 1

## 2017-12-05 NOTE — Progress Notes (Signed)
Nutrition Brief Note  Patient identified due to LOS.  37 year old male with PMH significant for IVDA (relapse recently), insulin-dependent diabetes, and hypertension who presented with sepsis secondary to soft tissue infection with abscesses of bilateral feet, osteomyelitis of the medial malleolus, and actinomyces bacteremia.  Pt underwent debridement of bilateral feet, debridement of the left ankle, and application of wound VAC on 9/13.  Wt Readings from Last 15 Encounters:  11/28/17 86.8 kg  11/20/17 86.8 kg  10/21/17 85.5 kg  09/30/17 87.5 kg  05/15/17 91.1 kg  02/18/17 87.9 kg  01/28/17 89 kg  12/18/16 82.6 kg  10/31/15 82.5 kg  08/16/15 85.3 kg  05/20/15 90.7 kg  12/05/14 81.9 kg  07/23/14 91.6 kg  12/10/13 76.7 kg  11/24/13 78.8 kg    Body mass index is 28.26 kg/m. Patient meets criteria for overweight based on current BMI.   Current diet order is Carb Modified, patient is consuming approximately 100% of meals at this time (receiving 1600-2000 kcal daily). Labs and medications reviewed.   No nutrition interventions warranted at this time. If nutrition issues arise, please consult RD.    Gaynell Face, MS, RD, LDN Inpatient Clinical Dietitian Pager: [phone] Weekend/After Hours: [phone]

## 2017-12-05 NOTE — Progress Notes (Signed)
PROGRESS NOTE        [name] Age: 37 y.o. Sex: male Date of Birth: 1980/09/10 Admit Date: 11/21/2017 Admitting Physician Patrecia Pour, MD NWG:[id number], [name], NP  Brief Narrative:   Patient is a 37 y.o. male with history of IVDA (relapse recently) insulin-dependent diabetes, hypertension who presented with sepsis secondary to soft tissue infection with abscesses of bilateral feet, osteomyelitis of the medial malleolus and actinomyces bacteremia.  Patient underwent debridement of bilateral feet, debridement of the left ankle and application of wound VAC on 9/13.  See below for further details.  Subjective:   Patient in bed, appears comfortable, denies any headache, no fever, no chest pain or pressure, no shortness of breath , no abdominal pain. No focal weakness.  Assessment/Plan:  Sepsis secondary to bilateral lower extremity soft tissue infection with abscesses and osteomyelitis of the left medial malleolus along with actinomyces bacteremia: Orthopedics and ID following, currently on IV penicillin G, sepsis physiology has resolved, he is status post debridement by orthopedics on 11/29/2017, continue wound VAC and wound care.  TTE and TEE done this admission are negative for any endocarditis.  He is supposed to get 4 weeks of IV antibiotics, has a PICC line in place.  Due to his history of IV drug use he will be kept in the hospital for monitoring as no safe placement can be arranged.  DKA in a patient with DM type II: Poor control outpatient with A1c of 12.1, DKA has resolved, he is currently on 70/30 insulin along with sliding scale, continue to monitor and adjust.  CBG (last 3)  Recent Labs    12/04/17 1648 12/04/17 2040 12/05/17 0809  GLUCAP 245* 139* 179*     CKD 3: Appears his baseline creatinine is anywhere between 1.3-1.4, monitor closely.  For now holding ACE inhibitor.  ARF has resolved.  Hypertension: BP well controlled on  Norvasc will add scheduled hydralazine for better control, PRN hydralazine on board, holding ACE inhibitor due to mild ARF that he had developed.  Hypothyroidism: Continue levothyroxine  Peripheral neuropathy: Likely related to diabetes, continue Neurontin  Chronic hepatitis C: We will need outpatient follow-up at the ID clinic.  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  Claims he was clean for 2 years before his most recent relapse.  He has a history of numerous soft tissue infections in his bilateral upper extremity-and apparently has a history of fasciotomy in his left upper extremity.  Has been counseled extensively.    DVT Prophylaxis: Prophylactic Lovenox  Code Status: Full code   Family Communication: None at bedside  Disposition Plan: Remain inpatient-given history of IV drug use-not a candidate for outpatient IV antimicrobial therapy, if he cannot be placed into SNF-will require several weeks of inpatient hospitalization.  Antimicrobial agents: Anti-infectives (From admission, onward)   Start     Dose/Rate Route Frequency Ordered Stop   11/27/17 0730  ceFAZolin (ANCEF) IVPB 2g/100 mL premix  Status:  Discontinued     2 g 200 mL/hr over 30 Minutes Intravenous To Short Stay 11/27/17 0716 11/27/17 1317   11/25/17 1400  penicillin G potassium 4 Million Units in dextrose 5 % 250 mL IVPB     4 Million Units 250 mL/hr over 60 Minutes Intravenous Every 4 hours 11/25/17 1254  11/25/17 0800  vancomycin (VANCOCIN) IVPB 1000 mg/200 mL premix  Status:  Discontinued     1,000 mg 200 mL/hr over 60 Minutes Intravenous Every 8 hours 11/25/17 0129 11/25/17 1413   11/24/17 0600  ceFAZolin (ANCEF) IVPB 2g/100 mL premix     2 g 200 mL/hr over 30 Minutes Intravenous On call to O.R. 11/23/17 2306 11/25/17 0559   11/23/17 1400  piperacillin-tazobactam (ZOSYN) IVPB 3.375 g  Status:  Discontinued     3.375  g 12.5 mL/hr over 240 Minutes Intravenous Every 8 hours 11/23/17 1031 11/25/17 1254   11/23/17 0000  metroNIDAZOLE (FLAGYL) IVPB 500 mg  Status:  Discontinued     500 mg 100 mL/hr over 60 Minutes Intravenous Every 8 hours 11/22/17 2312 11/23/17 1014   11/22/17 2315  cefTRIAXone (ROCEPHIN) 2 g in sodium chloride 0.9 % 100 mL IVPB  Status:  Discontinued     2 g 200 mL/hr over 30 Minutes Intravenous Every 24 hours 11/22/17 2312 11/23/17 1014   11/22/17 1400  piperacillin-tazobactam (ZOSYN) IVPB 3.375 g  Status:  Discontinued     3.375 g 12.5 mL/hr over 240 Minutes Intravenous Every 8 hours 11/22/17 1043 11/22/17 2311   11/22/17 1045  vancomycin (VANCOCIN) IVPB 750 mg/150 ml premix  Status:  Discontinued     750 mg 150 mL/hr over 60 Minutes Intravenous Every 12 hours 11/22/17 1043 11/25/17 0129   11/21/17 1830  vancomycin (VANCOCIN) IVPB 750 mg/150 ml premix     750 mg 150 mL/hr over 60 Minutes Intravenous  Once 11/21/17 1816 11/21/17 2030   11/21/17 1715  vancomycin (VANCOCIN) IVPB 1000 mg/200 mL premix     1,000 mg 200 mL/hr over 60 Minutes Intravenous  Once 11/21/17 1710 11/21/17 1914   11/21/17 1715  piperacillin-tazobactam (ZOSYN) IVPB 3.375 g     3.375 g 12.5 mL/hr over 240 Minutes Intravenous  Once 11/21/17 1710 11/21/17 2030      Procedures:  9/13>> debridement bilateral feet, debridement left ankle and application of wound VAC (Dr. Sharol Given)  9/12>> TEE  - No vegetations are seen. There is 1-2+ central aortic insufficiency. Mild LVH. Chiari network (normal variant) is present.  9/9>>TTE:  - Left ventricle: There was mild concentric hypertrophy. Systolic function was normal. The estimated ejection fraction was in the range of 60% to 65%. Left ventricular diastolic function parameters were normal. - Aortic valve: Transvalvular velocity was within the normal range. There was no stenosis. There was mild regurgitation. Valve area (VTI): 2.83 cm^2. Valve area (Vmax): 2.61 cm^2. Valve area  (Vmean): 2.72 cm^2. Regurgitation pressure half-time: 944 ms. - Left atrium: The atrium was mildly dilated. - Atrial septum: No defect or patent foramen ovale was identified  by color flow Doppler. - Tricuspid valve: There was mild regurgitation. - Pulmonic valve: There was moderate regurgitation. - Pulmonary arteries: Systolic pressure was within the normal  range. PA peak pressure: 30 mm Hg (S). - Pericardium, extracardiac: A trivial pericardial effusion was  identified.  CONSULTS:  ID and orthopedic surgery  Time spent: 25- minutes-Greater than 50% of this time was spent in counseling, explanation of diagnosis, planning of further management, and coordination of care.  MEDICATIONS: Scheduled Meds: . amLODipine  10 mg Oral Daily  . docusate sodium  100 mg Oral BID  . enoxaparin (LOVENOX) injection  40 mg Subcutaneous Daily  . famotidine  20 mg Oral BID  . gabapentin  400 mg Oral TID  . hepatitis b vaccine  1 mL Intramuscular Once  .  insulin aspart  0-5 Units Subcutaneous QHS  . insulin aspart  0-9 Units Subcutaneous TID WC  . insulin aspart protamine- aspart  50 Units Subcutaneous BID WC  . levothyroxine  50 mcg Oral QAC breakfast  . traZODone  50 mg Oral QHS   Continuous Infusions: . pencillin G potassium IV 4 Million Units (12/05/17 0832)   PRN Meds:.acetaminophen, bisacodyl, dextrose, hydrALAZINE, HYDROmorphone (DILAUDID) injection, magnesium citrate, [DISCONTINUED] ondansetron **OR** ondansetron (ZOFRAN) IV, oxyCODONE, polyethylene glycol, sodium chloride flush, traMADol   PHYSICAL EXAM: Vital signs: Vitals:   12/04/17 1405 12/04/17 2041 12/05/17 0516 12/05/17 0828  BP: 120/72 130/71 139/81 (!) 137/97  Pulse: (!) 117 (!) 102 86 88  Resp:  18 18 16   Temp: 98.6 F (37 C) 98.7 F (37.1 C) 97.9 F (36.6 C) 98.6 F (37 C)  TempSrc: Oral Oral Oral Oral  SpO2: 99% 98% 100% 100%  Weight:      Height:       Filed Weights   11/23/17 0227 11/28/17 0729  Weight: 86.8 kg  86.8 kg   Body mass index is 28.26 kg/m.   Exam  Awake Alert, Oriented X 3, No new F.N deficits, Normal affect [place].AT,PERRAL Supple Neck,No JVD, No cervical lymphadenopathy appriciated.  Symmetrical Chest wall movement, Good air movement bilaterally, CTAB RRR,No Gallops, Rubs or new Murmurs, No Parasternal Heave +ve B.Sounds, Abd Soft, No tenderness, No organomegaly appriciated, No rebound - guarding or rigidity. No Cyanosis, Clubbing or edema, Wound Vac to both feet   I have personally reviewed following labs and imaging studies  LABORATORY DATA: CBC: Recent Labs  Lab 11/30/17 0808 12/01/17 0322 12/04/17 0421  WBC 15.2* 15.8* 13.6*  HGB 9.4* 9.2* 9.9*  HCT 30.6* 29.0* 31.5*  MCV 85.5 85.3 86.1  PLT 562* 555* 573*    Basic Metabolic Panel: Recent Labs  Lab 11/30/17 0808 12/01/17 0322 12/02/17 0312 12/03/17 1225 12/04/17 0421 12/05/17 0352  NA 133* 139 140  --  141 138  K 4.8 4.0 3.7  --  4.0 4.0  CL 103 109 107  --  105 104  CO2 23 23 25   --  24 24  GLUCOSE 484* 105* 114*  --  112* 96  BUN 20 14 14   --  15 20  CREATININE 1.43* 1.38* 1.28* 1.24 1.45* 1.38*  CALCIUM 8.3* 8.5* 8.5*  --  9.2 8.9    GFR: Estimated Creatinine Clearance: 79.9 mL/min (A) (by C-G formula based on SCr of 1.38 mg/dL (H)).  Liver Function Tests: Recent Labs  Lab 12/04/17 0421  AST 22  ALT 23  ALKPHOS 90  BILITOT 0.5  PROT 6.1*  ALBUMIN 2.6*   No results for input(s): LIPASE, AMYLASE in the last 168 hours. No results for input(s): AMMONIA in the last 168 hours.  Coagulation Profile: No results for input(s): INR, PROTIME in the last 168 hours.  Cardiac Enzymes: No results for input(s): CKTOTAL, CKMB, CKMBINDEX, TROPONINI in the last 168 hours.  BNP (last 3 results) No results for input(s): PROBNP in the last 8760 hours.  HbA1C: No results for input(s): HGBA1C in the last 72 hours.  CBG: Recent Labs  Lab 12/04/17 0926 12/04/17 1157 12/04/17 1648 12/04/17 2040  12/05/17 0809  GLUCAP 186* 307* 245* 139* 179*    Lipid Profile: No results for input(s): CHOL, HDL, LDLCALC, TRIG, CHOLHDL, LDLDIRECT in the last 72 hours.  Thyroid Function Tests: No results for input(s): TSH, T4TOTAL, FREET4, T3FREE, THYROIDAB in the last 72 hours.  Anemia Panel:  No results for input(s): VITAMINB12, FOLATE, FERRITIN, TIBC, IRON, RETICCTPCT in the last 72 hours.  Urine analysis:    Component Value Date/Time   COLORURINE STRAW (A) 11/21/2017 1320   APPEARANCEUR CLEAR 11/21/2017 1320   APPEARANCEUR Clear 04/11/2014 0138   LABSPEC 1.027 11/21/2017 1320   LABSPEC 1.026 04/11/2014 0138   PHURINE 6.0 11/21/2017 1320   GLUCOSEU >=500 (A) 11/21/2017 1320   GLUCOSEU >=500 04/11/2014 0138   HGBUR MODERATE (A) 11/21/2017 1320   BILIRUBINUR NEGATIVE 11/21/2017 1320   BILIRUBINUR neg 02/18/2017 0946   BILIRUBINUR Negative 04/11/2014 0138   KETONESUR 20 (A) 11/21/2017 1320   PROTEINUR 30 (A) 11/21/2017 1320   UROBILINOGEN 0.2 02/18/2017 0946   UROBILINOGEN 0.2 12/10/2013 1221   NITRITE NEGATIVE 11/21/2017 1320   LEUKOCYTESUR NEGATIVE 11/21/2017 1320   LEUKOCYTESUR Negative 04/11/2014 0138    Sepsis Labs: Lactic Acid, Venous    Component Value Date/Time   LATICACIDVEN 1.77 11/21/2017 1957    MICROBIOLOGY: Recent Results (from the past 240 hour(s))  Aerobic/Anaerobic Culture (surgical/deep wound)     Status: None   Collection Time: 11/29/17  7:41 AM  Result Value Ref Range Status   Specimen Description ABSCESS RIGHT FOOT  Final   Special Requests NONE  Final   Gram Stain   Final    MODERATE WBC PRESENT, PREDOMINANTLY PMN FEW GRAM POSITIVE COCCI RARE GRAM NEGATIVE RODS RARE GRAM POSITIVE RODS    Culture   Final    MODERATE LACTOBACILLUS SPECIES FEW CANDIDA ALBICANS NO ANAEROBES ISOLATED Performed at [facility], [place] [address], [place], [place] [address]    Report Status 12/04/2017 FINAL  Final  Aerobic Culture (superficial specimen)      Status: None   Collection Time: 11/29/17  7:46 AM  Result Value Ref Range Status   Specimen Description ABSCESS LEFT FOOT  Final   Special Requests NONE  Final   Gram Stain   Final    FEW WBC PRESENT, PREDOMINANTLY PMN NO ORGANISMS SEEN Performed at [facility], [place] [address] [place], [place] [address]    Culture RARE LACTOBACILLUS SPECIES  Final   Report Status 12/01/2017 FINAL  Final  Anaerobic culture     Status: None   Collection Time: 11/29/17  7:47 AM  Result Value Ref Range Status   Specimen Description ABSCESS LEFT ANKLE  Final   Special Requests NONE  Final   Gram Stain   Final    FEW WBC PRESENT, PREDOMINANTLY PMN NO ORGANISMS SEEN    Culture   Final    NO ANAEROBES ISOLATED Performed at [facility], [place] [address] [place], [place] [address]    Report Status 12/04/2017 FINAL  Final    RADIOLOGY STUDIES/RESULTS: Mr Foot Left W Wo Contrast  Result Date: 11/22/2017 CLINICAL DATA:  IV drug abuser who injects in his feet. Bilateral foot wounds for 1 week. Chills and malaise. EXAM: MRI OF THE LEFT FOREFOOT WITHOUT AND WITH CONTRAST TECHNIQUE: Multiplanar, multisequence MR imaging of the left forefoot was performed both before and after administration of intravenous contrast. CONTRAST:  8.5 cc Gadavist IV COMPARISON:  Plain films left foot 11/21/2017. FINDINGS: Bones/Joint/Cartilage There is partial visualization of marrow edema in the medial malleolus with postcontrast enhancement. Bone marrow signal is otherwise normal. Ligaments Intact Muscles and Tendons No intramuscular fluid collection is identified. Soft tissues Diffuse subcutaneous edema enhancement are seen about the foot. There is an abscess in the dorsal subcutaneous tissues centered over the bases of  the first through third metatarsals. The abscess measures 1.8 cm craniocaudal by 4.6 cm transverse by 4.4 cm long. A second abscess in the subcutaneous tissues measuring 2.5 cm long by 2.3 cm craniocaudal by  0.9 cm transverse is identified along the proximal first metatarsal. This abscess is centered 1.5 cm distal to the first tarsometatarsal joint. A third abscess which is incompletely visualized is seen in subcutaneous tissues 3.2 cm superior to the tip of the medial malleolus. This collection measures approximately 1.5 cm craniocaudal by 0.2 cm transverse. IMPRESSION: Three subcutaneous abscesses are identified as described above. The largest collection is in the dorsal soft tissues at the level of the proximal first and third metatarsals. Edema and enhancement the medial malleolus consistent with osteomyelitis. Electronically Signed   By: Inge Rise M.D.   On: 11/22/2017 13:01   Dg Foot Complete Left  Result Date: 11/21/2017 CLINICAL DATA:  Bilateral foot pain for 4-5 days, left-greater-than-right, swelling and open sores, the patient is diabetic, and has a smoking history EXAM: LEFT FOOT - COMPLETE 3+ VIEW COMPARISON:  None. FINDINGS: There is soft tissue swelling over the dorsum of the left foot. However no underlying focal demineralization or erosion is seen to indicate osteomyelitis. No acute abnormality is noted. Joint spaces appear normal. No erosion is seen. IMPRESSION: Soft tissue swelling over the dorsum of the foot. No underlying bony abnormality. Electronically Signed   By: Ivar Drape M.D.   On: 11/21/2017 15:30   Dg Foot Complete Right  Result Date: 11/21/2017 CLINICAL DATA:  Bilateral foot pain for 4-5 days, left-greater-than-right with swelling and redness, the patient is diabetic and does have a smoking history EXAM: RIGHT FOOT COMPLETE - 3+ VIEW COMPARISON:  None FINDINGS: There is mild soft tissue swelling over the dorsum of the right foot. No underlying bony demineralization or erosion is seen to indicate active osteomyelitis. Alignment is normal and joint spaces appear normal. IMPRESSION: Soft tissue swelling over the dorsum. No evidence of active osteomyelitis. Electronically Signed    By: Ivar Drape M.D.   On: 11/21/2017 15:32   [place] Ekg Site Rite  Result Date: 11/22/2017 If Site Rite image not attached, placement could not be confirmed due to current cardiac rhythm.    LOS: 14 days   Lala Lund, MD  [facility]  If 7PM-7AM, please contact night-coverage  Please page via [email or web address]-Password [email or web address]-click on MD name and type text message  12/05/2017, 10:23 AM

## 2017-12-05 NOTE — Progress Notes (Signed)
Patient had no output from wound vacs today

## 2017-12-06 ENCOUNTER — Encounter: Payer: Self-pay | Admitting: Nurse Practitioner

## 2017-12-06 LAB — GLUCOSE, CAPILLARY
GLUCOSE-CAPILLARY: 197 mg/dL — AB (ref 70–99)
GLUCOSE-CAPILLARY: 401 mg/dL — AB (ref 70–99)
Glucose-Capillary: 167 mg/dL — ABNORMAL HIGH (ref 70–99)
Glucose-Capillary: 144 mg/dL — ABNORMAL HIGH (ref 70–99)

## 2017-12-06 LAB — CREATININE, SERUM
Creatinine, Ser: 1.35 mg/dL — ABNORMAL HIGH (ref 0.61–1.24)
GFR calc Af Amer: >60 mL/min (ref >60)
GFR calc non Af Amer: >60 mL/min (ref >60)

## 2017-12-06 MED ORDER — SILVER SULFADIAZINE 1 % EX CREA
TOPICAL_CREAM | Freq: Every day | CUTANEOUS | Status: DC
  Administered 2017-12-07 – 2017-12-12 (×6): via TOPICAL
  Filled 2017-12-06: qty 85

## 2017-12-06 MED ORDER — SODIUM CHLORIDE 0.9 % IV SOLN
INTRAVENOUS | Status: DC
  Administered 2017-12-06 – 2017-12-12 (×35): via INTRAVENOUS
  Filled 2017-12-06 (×41): qty 250

## 2017-12-06 MED ORDER — INSULIN ASPART 100 UNIT/ML ~~LOC~~ SOLN
3.0000 [IU] | Freq: Three times a day (TID) | SUBCUTANEOUS | Status: DC
  Administered 2017-12-06 – 2017-12-11 (×13): 3 [IU] via SUBCUTANEOUS

## 2017-12-06 NOTE — Progress Notes (Signed)
Inpatient Diabetes Program Recommendations  AACE/ADA: New Consensus Statement on Inpatient Glycemic Control (2015)  Target Ranges:  Prepandial:   less than 140 mg/dL      Peak postprandial:   less than 180 mg/dL (1-2 hours)      Critically ill patients:  140 - 180 mg/dL   Lab Results  Component Value Date   GLUCAP 401 (H) 12/06/2017   HGBA1C 12.1 (H) 11/22/2017    Review of Glycemic ControlResults for MARCOANTONIO, LEGAULT (MRN [id number]) as of 12/06/2017 13:06  Ref. Range 12/05/2017 08:09 12/05/2017 11:56 12/05/2017 16:42 12/05/2017 21:20 12/06/2017 08:10 12/06/2017 12:05  Glucose-Capillary Latest Ref Range: 70 - 99 mg/dL 179 (H) 173 (H) 369 (H) 345 (H) 197 (H) 401 (H)  Diabetes history:DM 2 Outpatient Diabetes medications: 70/30 40 units qam, 30 units qpm, Regular insulin SSI tid Current orders for Inpatient glycemic control: Novolog 70/30 mix 50 units bid, Novolog sensitive tid with meals and HS  Inpatient Diabetes Program Recommendations:    Note that blood sugars remain elevated.  May consider increasing AM dose of 70/30 to 60 units q AM.    Will follow.   Thanks,  Adah Perl, RN, BC-ADM Inpatient Diabetes Coordinator Pager [phone] (8a-5p)

## 2017-12-06 NOTE — Progress Notes (Signed)
Physical Therapy Treatment [name] MRN: [id number] DOB: March 22, 1980 Today's Date: 12/06/2017    History of Present Illness Pt is a 37 y/o male admitted secondary to sepsis and abscesses on BLE. 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 Pt is s/p bilateral foot debridement with wound vac placement. Pt is also s/p TEE on 9/12 which did not show any vegetation. PMH includes IV drug abuse, DM, HTN, and seizures.     PT Comments    Patient continues to make progress toward PT goals. PT will continue to follow acutely and progress as tolerated. Current plan remains appropriate.    Follow Up Recommendations  No PT follow up;Supervision for mobility/OOB     Equipment Recommendations  None recommended by PT    Recommendations for Other Services       Precautions / Restrictions Precautions Precautions: Fall Required Braces or Orthoses: Other Brace/Splint Other Brace/Splint: pt has post op shoes Restrictions Weight Bearing Restrictions: No RLE Weight Bearing: Weight bearing as tolerated LLE Weight Bearing: Weight bearing as tolerated    Mobility  Bed Mobility Overal bed mobility: Independent                Transfers Overall transfer level: Independent   Transfers: Sit to/from Stand              Ambulation/Gait Ambulation/Gait assistance: Supervision(assistance for lines/equipment)   Assistive device: None Gait Pattern/deviations: Antalgic;Step-through pattern;Decreased stride length Gait velocity: decreased   General Gait Details: slightly antalgic gait pattern; no balance deficits noted; decreased cadence but likely due to bilat LE wound vacs and IV pole   Stairs Stairs: Yes Stairs assistance: Min guard Stair Management: One rail Left;Alternating pattern;Forwards Number of Stairs: 4 General stair comments: limited by lines; min guard for safety   Wheelchair Mobility    Modified Rankin (Stroke Patients Only)        Balance Overall balance assessment: Needs assistance Sitting-balance support: No upper extremity supported;Feet supported Sitting balance-Leahy Scale: Good     Standing balance support: No upper extremity supported Standing balance-Leahy Scale: Good                              Cognition Arousal/Alertness: Awake/alert Behavior During Therapy: WFL for tasks assessed/performed Overall Cognitive Status: Within Functional Limits for tasks assessed                                        Exercises      General Comments        Pertinent Vitals/Pain Pain Assessment: Faces Faces Pain Scale: Hurts a little bit Pain Location: bilat feet (L>R) Pain Descriptors / Indicators: Aching;Sore Pain Intervention(s): Monitored during session;Repositioned    Home Living                      Prior Function            PT Goals (current goals can now be found in the care plan section) Progress towards PT goals: Progressing toward goals    Frequency    Min 2X/week      PT Plan Frequency needs to be updated;Discharge plan needs to be updated    Co-evaluation              AM-PAC PT "6 Clicks" Daily Activity  Outcome Measure  Difficulty  turning over in bed (including adjusting bedclothes, sheets and blankets)?: None Difficulty moving from lying on back to sitting on the side of the bed? : None Difficulty sitting down on and standing up from a chair with arms (e.g., wheelchair, bedside commode, etc,.)?: None Help needed moving to and from a bed to chair (including a wheelchair)?: None Help needed walking in hospital room?: A Little Help needed climbing 3-5 steps with a railing? : A Little 6 Click Score: 22    End of Session Equipment Utilized During Treatment: (bilateral wound vacs) Activity Tolerance: Patient tolerated treatment well Patient left: with call bell/phone within reach;in bed Nurse Communication: Mobility status PT Visit  Diagnosis: Other abnormalities of gait and mobility (R26.89);Difficulty in walking, not elsewhere classified (R26.2);Pain Pain - Right/Left: (bilateral ) Pain - part of body: Ankle and joints of foot     Time: 1100-1112 PT Time Calculation (min) (ACUTE ONLY): 12 min  Charges:  $Gait Training: 8-22 mins                     Earney Navy, PTA Acute Rehabilitation Services Pager: [phone] Office: [phone]     Darliss Cheney 12/06/2017, 1:32 PM

## 2017-12-06 NOTE — Progress Notes (Signed)
PROGRESS NOTE        [name] Age: 37 y.o. Sex: male Date of Birth: 1980/07/01 Admit Date: 11/21/2017 Admitting Physician Patrecia Pour, MD XIP:[id number], [name], NP  Brief Narrative:   Patient is a 37 y.o. male with history of IVDA (relapse recently) insulin-dependent diabetes, hypertension who presented with sepsis secondary to soft tissue infection with abscesses of bilateral feet, osteomyelitis of the medial malleolus and actinomyces bacteremia.  Patient underwent debridement of bilateral feet, debridement of the left ankle and application of wound VAC on 9/13.  See below for further details.  Subjective:   Patient in bed, appears comfortable, denies any headache, no fever, no chest pain or pressure, no shortness of breath , no abdominal pain. No focal weakness.   Assessment/Plan:  Sepsis secondary to bilateral lower extremity soft tissue infection with abscesses and osteomyelitis of the left medial malleolus along with actinomyces bacteremia: Orthopedics and ID following, currently on IV penicillin G, sepsis physiology has resolved, he is status post debridement by orthopedics on 11/29/2017, continue wound VAC and wound care.  TTE and TEE done this admission are negative for any endocarditis.  He is supposed to get 4 weeks of IV antibiotics, has a PICC line in place.  Due to his history of IV drug use he will be kept in the hospital for monitoring as no safe placement can be arranged.  DKA in a patient with DM type II: Poor control outpatient with A1c of 12.1, DKA has resolved, he is currently on 70/30 insulin along with sliding scale, continue to monitor and adjust.  CBG (last 3)  Recent Labs    12/05/17 1642 12/05/17 2120 12/06/17 0810  GLUCAP 369* 345* 197*     CKD 3: Appears his baseline creatinine is anywhere between 1.3-1.4, monitor closely.  For now holding ACE inhibitor.  ARF has resolved.  Hypertension: BP well controlled  on Norvasc will add scheduled hydralazine for better control, PRN hydralazine on board, holding ACE inhibitor due to mild ARF that he had developed.  Hypothyroidism: Continue levothyroxine  Peripheral neuropathy: Likely related to diabetes, continue Neurontin  Chronic hepatitis C: We will need outpatient follow-up at the ID clinic.  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  Claims he was clean for 2 years before his most recent relapse.  He has a history of numerous soft tissue infections in his bilateral upper extremity-and apparently has a history of fasciotomy in his left upper extremity.  Has been counseled extensively.    DVT Prophylaxis: Prophylactic Lovenox  Code Status: Full code   Family Communication: None at bedside  Disposition Plan: Remain inpatient-given history of IV drug use-not a candidate for outpatient IV antimicrobial therapy, if he cannot be placed into SNF-will require several weeks of inpatient hospitalization.  Antimicrobial agents: Anti-infectives (From admission, onward)   Start     Dose/Rate Route Frequency Ordered Stop   11/27/17 0730  ceFAZolin (ANCEF) IVPB 2g/100 mL premix  Status:  Discontinued     2 g 200 mL/hr over 30 Minutes Intravenous To Short Stay 11/27/17 0716 11/27/17 1317   11/25/17 1400  penicillin G potassium 4 Million Units in dextrose 5 % 250 mL IVPB     4 Million Units 250 mL/hr over 60 Minutes Intravenous Every 4 hours 11/25/17 1254  11/25/17 0800  vancomycin (VANCOCIN) IVPB 1000 mg/200 mL premix  Status:  Discontinued     1,000 mg 200 mL/hr over 60 Minutes Intravenous Every 8 hours 11/25/17 0129 11/25/17 1413   11/24/17 0600  ceFAZolin (ANCEF) IVPB 2g/100 mL premix     2 g 200 mL/hr over 30 Minutes Intravenous On call to O.R. 11/23/17 2306 11/25/17 0559   11/23/17 1400  piperacillin-tazobactam (ZOSYN) IVPB 3.375 g  Status:  Discontinued      3.375 g 12.5 mL/hr over 240 Minutes Intravenous Every 8 hours 11/23/17 1031 11/25/17 1254   11/23/17 0000  metroNIDAZOLE (FLAGYL) IVPB 500 mg  Status:  Discontinued     500 mg 100 mL/hr over 60 Minutes Intravenous Every 8 hours 11/22/17 2312 11/23/17 1014   11/22/17 2315  cefTRIAXone (ROCEPHIN) 2 g in sodium chloride 0.9 % 100 mL IVPB  Status:  Discontinued     2 g 200 mL/hr over 30 Minutes Intravenous Every 24 hours 11/22/17 2312 11/23/17 1014   11/22/17 1400  piperacillin-tazobactam (ZOSYN) IVPB 3.375 g  Status:  Discontinued     3.375 g 12.5 mL/hr over 240 Minutes Intravenous Every 8 hours 11/22/17 1043 11/22/17 2311   11/22/17 1045  vancomycin (VANCOCIN) IVPB 750 mg/150 ml premix  Status:  Discontinued     750 mg 150 mL/hr over 60 Minutes Intravenous Every 12 hours 11/22/17 1043 11/25/17 0129   11/21/17 1830  vancomycin (VANCOCIN) IVPB 750 mg/150 ml premix     750 mg 150 mL/hr over 60 Minutes Intravenous  Once 11/21/17 1816 11/21/17 2030   11/21/17 1715  vancomycin (VANCOCIN) IVPB 1000 mg/200 mL premix     1,000 mg 200 mL/hr over 60 Minutes Intravenous  Once 11/21/17 1710 11/21/17 1914   11/21/17 1715  piperacillin-tazobactam (ZOSYN) IVPB 3.375 g     3.375 g 12.5 mL/hr over 240 Minutes Intravenous  Once 11/21/17 1710 11/21/17 2030      Procedures:  9/13>> debridement bilateral feet, debridement left ankle and application of wound VAC (Dr. Sharol Given)  9/12>> TEE  - No vegetations are seen. There is 1-2+ central aortic insufficiency. Mild LVH. Chiari network (normal variant) is present.  9/9>>TTE:  - Left ventricle: There was mild concentric hypertrophy. Systolic function was normal. The estimated ejection fraction was in the range of 60% to 65%. Left ventricular diastolic function parameters were normal. - Aortic valve: Transvalvular velocity was within the normal range. There was no stenosis. There was mild regurgitation. Valve area (VTI): 2.83 cm^2. Valve area (Vmax): 2.61 cm^2.  Valve area (Vmean): 2.72 cm^2. Regurgitation pressure half-time: 944 ms. - Left atrium: The atrium was mildly dilated. - Atrial septum: No defect or patent foramen ovale was identified  by color flow Doppler. - Tricuspid valve: There was mild regurgitation. - Pulmonic valve: There was moderate regurgitation. - Pulmonary arteries: Systolic pressure was within the normal  range. PA peak pressure: 30 mm Hg (S). - Pericardium, extracardiac: A trivial pericardial effusion was  identified.  CONSULTS:  ID and orthopedic surgery  Time spent: 25- minutes-Greater than 50% of this time was spent in counseling, explanation of diagnosis, planning of further management, and coordination of care.  MEDICATIONS: Scheduled Meds: . amLODipine  10 mg Oral Daily  . docusate sodium  100 mg Oral BID  . enoxaparin (LOVENOX) injection  40 mg Subcutaneous Daily  . famotidine  20 mg Oral BID  . gabapentin  400 mg Oral TID  . hepatitis b vaccine  1 mL Intramuscular Once  .  hydrALAZINE  50 mg Oral Q8H  . insulin aspart  0-5 Units Subcutaneous QHS  . insulin aspart  0-9 Units Subcutaneous TID WC  . insulin aspart protamine- aspart  50 Units Subcutaneous BID WC  . levothyroxine  50 mcg Oral QAC breakfast  . traZODone  50 mg Oral QHS   Continuous Infusions: . pencillin G potassium IV 4 Million Units (12/06/17 1035)   PRN Meds:.acetaminophen, bisacodyl, dextrose, hydrALAZINE, HYDROmorphone (DILAUDID) injection, magnesium citrate, [DISCONTINUED] ondansetron **OR** ondansetron (ZOFRAN) IV, oxyCODONE, polyethylene glycol, sodium chloride flush, traMADol   PHYSICAL EXAM: Vital signs: Vitals:   12/05/17 1428 12/05/17 2125 12/05/17 2300 12/06/17 0519  BP: 122/77 (!) 102/49 132/84 115/62  Pulse: (!) 105 (!) 102 (!) 104 96  Resp: 19 17  17   Temp: 98.7 F (37.1 C) 98.3 F (36.8 C)  98 F (36.7 C)  TempSrc: Oral Oral  Oral  SpO2: 99% 100%  100%  Weight:      Height:       Filed Weights   11/23/17 0227  11/28/17 0729  Weight: 86.8 kg 86.8 kg   Body mass index is 28.26 kg/m.   Exam  Awake Alert, Oriented X 3, No new F.N deficits, Normal affect [place].AT,PERRAL Supple Neck,No JVD, No cervical lymphadenopathy appriciated.  Symmetrical Chest wall movement, Good air movement bilaterally, CTAB RRR,No Gallops, Rubs or new Murmurs, No Parasternal Heave +ve B.Sounds, Abd Soft, No tenderness, No organomegaly appriciated, No rebound - guarding or rigidity. No Cyanosis, Clubbing or edema,  Wound Vac to both feet   I have personally reviewed following labs and imaging studies  LABORATORY DATA: CBC: Recent Labs  Lab 11/30/17 0808 12/01/17 0322 12/04/17 0421  WBC 15.2* 15.8* 13.6*  HGB 9.4* 9.2* 9.9*  HCT 30.6* 29.0* 31.5*  MCV 85.5 85.3 86.1  PLT 562* 555* 573*    Basic Metabolic Panel: Recent Labs  Lab 11/30/17 0808 12/01/17 0322 12/02/17 0312 12/03/17 1225 12/04/17 0421 12/05/17 0352 12/06/17 0335  NA 133* 139 140  --  141 138  --   K 4.8 4.0 3.7  --  4.0 4.0  --   CL 103 109 107  --  105 104  --   CO2 23 23 25   --  24 24  --   GLUCOSE 484* 105* 114*  --  112* 96  --   BUN 20 14 14   --  15 20  --   CREATININE 1.43* 1.38* 1.28* 1.24 1.45* 1.38* 1.35*  CALCIUM 8.3* 8.5* 8.5*  --  9.2 8.9  --     GFR: Estimated Creatinine Clearance: 81.7 mL/min (A) (by C-G formula based on SCr of 1.35 mg/dL (H)).  Liver Function Tests: Recent Labs  Lab 12/04/17 0421  AST 22  ALT 23  ALKPHOS 90  BILITOT 0.5  PROT 6.1*  ALBUMIN 2.6*   No results for input(s): LIPASE, AMYLASE in the last 168 hours. No results for input(s): AMMONIA in the last 168 hours.  Coagulation Profile: No results for input(s): INR, PROTIME in the last 168 hours.  Cardiac Enzymes: No results for input(s): CKTOTAL, CKMB, CKMBINDEX, TROPONINI in the last 168 hours.  BNP (last 3 results) No results for input(s): PROBNP in the last 8760 hours.  HbA1C: No results for input(s): HGBA1C in the last 72  hours.  CBG: Recent Labs  Lab 12/05/17 0809 12/05/17 1156 12/05/17 1642 12/05/17 2120 12/06/17 0810  GLUCAP 179* 173* 369* 345* 197*    Lipid Profile: No results for input(s): CHOL,  HDL, LDLCALC, TRIG, CHOLHDL, LDLDIRECT in the last 72 hours.  Thyroid Function Tests: No results for input(s): TSH, T4TOTAL, FREET4, T3FREE, THYROIDAB in the last 72 hours.  Anemia Panel: No results for input(s): VITAMINB12, FOLATE, FERRITIN, TIBC, IRON, RETICCTPCT in the last 72 hours.  Urine analysis:    Component Value Date/Time   COLORURINE STRAW (A) 11/21/2017 1320   APPEARANCEUR CLEAR 11/21/2017 1320   APPEARANCEUR Clear 04/11/2014 0138   LABSPEC 1.027 11/21/2017 1320   LABSPEC 1.026 04/11/2014 0138   PHURINE 6.0 11/21/2017 1320   GLUCOSEU >=500 (A) 11/21/2017 1320   GLUCOSEU >=500 04/11/2014 0138   HGBUR MODERATE (A) 11/21/2017 1320   BILIRUBINUR NEGATIVE 11/21/2017 1320   BILIRUBINUR neg 02/18/2017 0946   BILIRUBINUR Negative 04/11/2014 0138   KETONESUR 20 (A) 11/21/2017 1320   PROTEINUR 30 (A) 11/21/2017 1320   UROBILINOGEN 0.2 02/18/2017 0946   UROBILINOGEN 0.2 12/10/2013 1221   NITRITE NEGATIVE 11/21/2017 1320   LEUKOCYTESUR NEGATIVE 11/21/2017 1320   LEUKOCYTESUR Negative 04/11/2014 0138    Sepsis Labs: Lactic Acid, Venous    Component Value Date/Time   LATICACIDVEN 1.77 11/21/2017 1957    MICROBIOLOGY: Recent Results (from the past 240 hour(s))  Aerobic/Anaerobic Culture (surgical/deep wound)     Status: None   Collection Time: 11/29/17  7:41 AM  Result Value Ref Range Status   Specimen Description ABSCESS RIGHT FOOT  Final   Special Requests NONE  Final   Gram Stain   Final    MODERATE WBC PRESENT, PREDOMINANTLY PMN FEW GRAM POSITIVE COCCI RARE GRAM NEGATIVE RODS RARE GRAM POSITIVE RODS    Culture   Final    MODERATE LACTOBACILLUS SPECIES FEW CANDIDA ALBICANS NO ANAEROBES ISOLATED Performed at [facility], [place] [address], [place], [place]  [address]    Report Status 12/04/2017 FINAL  Final  Aerobic Culture (superficial specimen)     Status: None   Collection Time: 11/29/17  7:46 AM  Result Value Ref Range Status   Specimen Description ABSCESS LEFT FOOT  Final   Special Requests NONE  Final   Gram Stain   Final    FEW WBC PRESENT, PREDOMINANTLY PMN NO ORGANISMS SEEN Performed at [facility], [place] [address] [place], [place] [address]    Culture RARE LACTOBACILLUS SPECIES  Final   Report Status 12/01/2017 FINAL  Final  Anaerobic culture     Status: None   Collection Time: 11/29/17  7:47 AM  Result Value Ref Range Status   Specimen Description ABSCESS LEFT ANKLE  Final   Special Requests NONE  Final   Gram Stain   Final    FEW WBC PRESENT, PREDOMINANTLY PMN NO ORGANISMS SEEN    Culture   Final    NO ANAEROBES ISOLATED Performed at [facility], [place] [address], [place], [place] [address]    Report Status 12/04/2017 FINAL  Final    RADIOLOGY STUDIES/RESULTS: Mr Foot Left W Wo Contrast  Result Date: 11/22/2017 CLINICAL DATA:  IV drug abuser who injects in his feet. Bilateral foot wounds for 1 week. Chills and malaise. EXAM: MRI OF THE LEFT FOREFOOT WITHOUT AND WITH CONTRAST TECHNIQUE: Multiplanar, multisequence MR imaging of the left forefoot was performed both before and after administration of intravenous contrast. CONTRAST:  8.5 cc Gadavist IV COMPARISON:  Plain films left foot 11/21/2017. FINDINGS: Bones/Joint/Cartilage There is partial visualization of marrow edema in the medial malleolus with postcontrast enhancement. Bone marrow signal is otherwise normal. Ligaments Intact Muscles and Tendons  No intramuscular fluid collection is identified. Soft tissues Diffuse subcutaneous edema enhancement are seen about the foot. There is an abscess in the dorsal subcutaneous tissues centered over the bases of the first through third metatarsals. The abscess measures 1.8 cm craniocaudal by 4.6 cm transverse by 4.4 cm  long. A second abscess in the subcutaneous tissues measuring 2.5 cm long by 2.3 cm craniocaudal by 0.9 cm transverse is identified along the proximal first metatarsal. This abscess is centered 1.5 cm distal to the first tarsometatarsal joint. A third abscess which is incompletely visualized is seen in subcutaneous tissues 3.2 cm superior to the tip of the medial malleolus. This collection measures approximately 1.5 cm craniocaudal by 0.2 cm transverse. IMPRESSION: Three subcutaneous abscesses are identified as described above. The largest collection is in the dorsal soft tissues at the level of the proximal first and third metatarsals. Edema and enhancement the medial malleolus consistent with osteomyelitis. Electronically Signed   By: Inge Rise M.D.   On: 11/22/2017 13:01   Dg Foot Complete Left  Result Date: 11/21/2017 CLINICAL DATA:  Bilateral foot pain for 4-5 days, left-greater-than-right, swelling and open sores, the patient is diabetic, and has a smoking history EXAM: LEFT FOOT - COMPLETE 3+ VIEW COMPARISON:  None. FINDINGS: There is soft tissue swelling over the dorsum of the left foot. However no underlying focal demineralization or erosion is seen to indicate osteomyelitis. No acute abnormality is noted. Joint spaces appear normal. No erosion is seen. IMPRESSION: Soft tissue swelling over the dorsum of the foot. No underlying bony abnormality. Electronically Signed   By: Ivar Drape M.D.   On: 11/21/2017 15:30   Dg Foot Complete Right  Result Date: 11/21/2017 CLINICAL DATA:  Bilateral foot pain for 4-5 days, left-greater-than-right with swelling and redness, the patient is diabetic and does have a smoking history EXAM: RIGHT FOOT COMPLETE - 3+ VIEW COMPARISON:  None FINDINGS: There is mild soft tissue swelling over the dorsum of the right foot. No underlying bony demineralization or erosion is seen to indicate active osteomyelitis. Alignment is normal and joint spaces appear normal.  IMPRESSION: Soft tissue swelling over the dorsum. No evidence of active osteomyelitis. Electronically Signed   By: Ivar Drape M.D.   On: 11/21/2017 15:32   [place] Ekg Site Rite  Result Date: 11/22/2017 If Site Rite image not attached, placement could not be confirmed due to current cardiac rhythm.    LOS: 15 days   Lala Lund, MD  [facility]  If 7PM-7AM, please contact night-coverage  Please page via [email or web address] [email or web address]-click on MD name and type text message  12/06/2017, 10:43 AM

## 2017-12-07 ENCOUNTER — Encounter: Payer: Self-pay | Admitting: Nurse Practitioner

## 2017-12-07 LAB — GLUCOSE, CAPILLARY
GLUCOSE-CAPILLARY: 464 mg/dL — AB (ref 70–99)
GLUCOSE-CAPILLARY: 128 mg/dL — AB (ref 70–99)
Glucose-Capillary: 193 mg/dL — ABNORMAL HIGH (ref 70–99)
Glucose-Capillary: 75 mg/dL (ref 70–99)
Glucose-Capillary: 225 mg/dL — ABNORMAL HIGH (ref 70–99)

## 2017-12-07 LAB — CREATININE, SERUM
Creatinine, Ser: 1.58 mg/dL — ABNORMAL HIGH (ref 0.61–1.24)
GFR calc non Af Amer: 54 mL/min — ABNORMAL LOW (ref >60)

## 2017-12-07 MED ORDER — INSULIN ASPART 100 UNIT/ML ~~LOC~~ SOLN: 0.0000 [IU] | Freq: Every day | SUBCUTANEOUS | Status: DC

## 2017-12-07 MED ORDER — INSULIN ASPART 100 UNIT/ML ~~LOC~~ SOLN
20.0000 [IU] | Freq: Once | SUBCUTANEOUS | Status: AC
  Administered 2017-12-07: 20 [IU] via SUBCUTANEOUS

## 2017-12-07 MED ORDER — INSULIN ASPART 100 UNIT/ML ~~LOC~~ SOLN
0.0000 [IU] | Freq: Three times a day (TID) | SUBCUTANEOUS | Status: DC
  Administered 2017-12-07: 5 [IU] via SUBCUTANEOUS
  Administered 2017-12-08: 3 [IU] via SUBCUTANEOUS
  Administered 2017-12-09: 11 [IU] via SUBCUTANEOUS
  Administered 2017-12-09: 2 [IU] via SUBCUTANEOUS
  Administered 2017-12-10: 15 [IU] via SUBCUTANEOUS
  Administered 2017-12-10: 8 [IU] via SUBCUTANEOUS
  Administered 2017-12-11: 2 [IU] via SUBCUTANEOUS
  Administered 2017-12-11: 5 [IU] via SUBCUTANEOUS
  Administered 2017-12-12: 8 [IU] via SUBCUTANEOUS
  Administered 2017-12-12: 5 [IU] via SUBCUTANEOUS

## 2017-12-07 NOTE — Progress Notes (Signed)
This nurse notes no drainage from bilateral wound vac x 3 days.  Call placed to notify Dr. Sharol Given, ok to remove wound vac.  See new dressing orders.  [id number]

## 2017-12-07 NOTE — Progress Notes (Addendum)
This nurse received next dose, Penicillin G fluid order changed to normal saline. Medication received mixed in dextrose. Unit pharmacist notified and will follow up.  [id number]

## 2017-12-07 NOTE — Progress Notes (Signed)
PROGRESS NOTE        [name] Age: 37 y.o. Sex: male Date of Birth: Dec 18, 1980 Admit Date: 11/21/2017 Admitting Physician Patrecia Pour, MD [id number], [name], NP  Brief Narrative:   Patient is a 37 y.o. male with history of IVDA (relapse recently) insulin-dependent diabetes, hypertension who presented with sepsis secondary to soft tissue infection with abscesses of bilateral feet, osteomyelitis of the medial malleolus and actinomyces bacteremia.  Patient underwent debridement of bilateral feet, debridement of the left ankle and application of wound VAC on 9/13.  See below for further details.  Subjective:   Patient in bed, appears comfortable, denies any headache, no fever, no chest pain or pressure, no shortness of breath , no abdominal pain. No focal weakness.  Assessment/Plan:  Sepsis secondary to bilateral lower extremity soft tissue infection with abscesses and osteomyelitis of the left medial malleolus along with actinomyces bacteremia: Orthopedics and ID following, currently on IV penicillin G, sepsis physiology has resolved, he is status post debridement by orthopedics on 11/29/2017, and wound VAC in both feet, orthopedics cleared for wound VAC to be removed on 12/07/2017.  TTE and TEE done this admission are negative for any endocarditis.  He is supposed to get 4 weeks of IV antibiotics, has a PICC line in place.  Due to his history of IV drug use he will be kept in the hospital for monitoring as no safe placement can be arranged.  DKA in a patient with DM type II: Poor control outpatient with A1c of 12.1, DKA has resolved, he is currently on 70/30 insulin along with sliding scale, continue to monitor and adjust.  One abrupt rise in CBG on 12/07/2017 likely due to dietary noncompliance, extra NovoLog subcu given will continue to monitor.  CBG (last 3)  Recent Labs    12/06/17 2200 12/07/17 0749 12/07/17 1022  GLUCAP 144* 464* 193*       CKD 3: Appears his baseline creatinine is anywhere between 1.3-1.4, monitor closely.  For now holding ACE inhibitor.  ARF has resolved.  Hypertension: BP well controlled on Norvasc will add scheduled hydralazine for better control, PRN hydralazine on board, holding ACE inhibitor due to mild ARF that he had developed.  Hypothyroidism: Continue levothyroxine  Peripheral neuropathy: Likely related to diabetes, continue Neurontin  Chronic hepatitis C: We will need outpatient follow-up at the ID clinic.  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  Claims he was clean for 2 years before his most recent relapse.  He has a history of numerous soft tissue infections in his bilateral upper extremity-and apparently has a history of fasciotomy in his left upper extremity.  Has been counseled extensively.    DVT Prophylaxis: Prophylactic Lovenox  Code Status: Full code   Family Communication: Girlfriend/wife at bedside on 12/07/2017  Disposition Plan: Remain inpatient-given history of IV drug use-not a candidate for outpatient IV antimicrobial therapy, if he cannot be placed into SNF-will require several weeks of inpatient hospitalization.  Antimicrobial agents: Anti-infectives (From admission, onward)   Start     Dose/Rate Route Frequency Ordered Stop   12/06/17 1630  sodium chloride 0.9 % 250 mL with penicillin G potassium 4,000,000 Units infusion     250 mL/hr  Intravenous Every 4 hours 12/06/17 1456     11/27/17 0730  ceFAZolin (ANCEF) IVPB 2g/100 mL premix  Status:  Discontinued     2 g 200 mL/hr over 30 Minutes Intravenous To Short Stay 11/27/17 0716 11/27/17 1317   11/25/17 1400  penicillin G potassium 4 Million Units in dextrose 5 % 250 mL IVPB  Status:  Discontinued     4 Million Units 250 mL/hr over 60 Minutes Intravenous Every 4 hours 11/25/17 1254 12/06/17 1600   11/25/17 0800  vancomycin  (VANCOCIN) IVPB 1000 mg/200 mL premix  Status:  Discontinued     1,000 mg 200 mL/hr over 60 Minutes Intravenous Every 8 hours 11/25/17 0129 11/25/17 1413   11/24/17 0600  ceFAZolin (ANCEF) IVPB 2g/100 mL premix     2 g 200 mL/hr over 30 Minutes Intravenous On call to O.R. 11/23/17 2306 11/25/17 0559   11/23/17 1400  piperacillin-tazobactam (ZOSYN) IVPB 3.375 g  Status:  Discontinued     3.375 g 12.5 mL/hr over 240 Minutes Intravenous Every 8 hours 11/23/17 1031 11/25/17 1254   11/23/17 0000  metroNIDAZOLE (FLAGYL) IVPB 500 mg  Status:  Discontinued     500 mg 100 mL/hr over 60 Minutes Intravenous Every 8 hours 11/22/17 2312 11/23/17 1014   11/22/17 2315  cefTRIAXone (ROCEPHIN) 2 g in sodium chloride 0.9 % 100 mL IVPB  Status:  Discontinued     2 g 200 mL/hr over 30 Minutes Intravenous Every 24 hours 11/22/17 2312 11/23/17 1014   11/22/17 1400  piperacillin-tazobactam (ZOSYN) IVPB 3.375 g  Status:  Discontinued     3.375 g 12.5 mL/hr over 240 Minutes Intravenous Every 8 hours 11/22/17 1043 11/22/17 2311   11/22/17 1045  vancomycin (VANCOCIN) IVPB 750 mg/150 ml premix  Status:  Discontinued     750 mg 150 mL/hr over 60 Minutes Intravenous Every 12 hours 11/22/17 1043 11/25/17 0129   11/21/17 1830  vancomycin (VANCOCIN) IVPB 750 mg/150 ml premix     750 mg 150 mL/hr over 60 Minutes Intravenous  Once 11/21/17 1816 11/21/17 2030   11/21/17 1715  vancomycin (VANCOCIN) IVPB 1000 mg/200 mL premix     1,000 mg 200 mL/hr over 60 Minutes Intravenous  Once 11/21/17 1710 11/21/17 1914   11/21/17 1715  piperacillin-tazobactam (ZOSYN) IVPB 3.375 g     3.375 g 12.5 mL/hr over 240 Minutes Intravenous  Once 11/21/17 1710 11/21/17 2030      Procedures:  9/13>> debridement bilateral feet, debridement left ankle and application of wound VAC (Dr. Sharol Given)  9/12>> TEE  - No vegetations are seen. There is 1-2+ central aortic insufficiency. Mild LVH. Chiari network (normal variant) is present.  9/9>>TTE:   - Left ventricle: There was mild concentric hypertrophy. Systolic function was normal. The estimated ejection fraction was in the range of 60% to 65%. Left ventricular diastolic function parameters were normal. - Aortic valve: Transvalvular velocity was within the normal range. There was no stenosis. There was mild regurgitation. Valve area (VTI): 2.83 cm^2. Valve area (Vmax): 2.61 cm^2. Valve area (Vmean): 2.72 cm^2. Regurgitation pressure half-time: 944 ms. - Left atrium: The atrium was mildly dilated. - Atrial septum: No defect or patent foramen ovale was identified  by color flow Doppler. - Tricuspid valve: There was mild regurgitation. - Pulmonic valve: There was moderate regurgitation. - Pulmonary arteries: Systolic pressure was within the normal  range. PA peak pressure: 30 mm Hg (S). - Pericardium, extracardiac: A trivial pericardial effusion was  identified.  CONSULTS:  ID and orthopedic surgery  Time spent: 25- minutes-Greater than 50% of this time  was spent in counseling, explanation of diagnosis, planning of further management, and coordination of care.  MEDICATIONS: Scheduled Meds: . amLODipine  10 mg Oral Daily  . docusate sodium  100 mg Oral BID  . enoxaparin (LOVENOX) injection  40 mg Subcutaneous Daily  . famotidine  20 mg Oral BID  . gabapentin  400 mg Oral TID  . hepatitis b vaccine  1 mL Intramuscular Once  . hydrALAZINE  50 mg Oral Q8H  . insulin aspart  0-15 Units Subcutaneous TID WC  . insulin aspart  0-5 Units Subcutaneous QHS  . insulin aspart  3 Units Subcutaneous TID WC  . insulin aspart protamine- aspart  50 Units Subcutaneous BID WC  . levothyroxine  50 mcg Oral QAC breakfast  . silver sulfADIAZINE   Topical Daily  . traZODone  50 mg Oral QHS   Continuous Infusions: . sodium chloride 0.9 % 250 mL with penicillin G potassium 4,000,000 Units infusion 250 mL/hr at 12/07/17 0758   PRN Meds:.acetaminophen, bisacodyl, dextrose, hydrALAZINE, HYDROmorphone  (DILAUDID) injection, magnesium citrate, [DISCONTINUED] ondansetron **OR** ondansetron (ZOFRAN) IV, oxyCODONE, polyethylene glycol, sodium chloride flush, traMADol   PHYSICAL EXAM: Vital signs: Vitals:   12/06/17 1430 12/06/17 2201 12/07/17 0500 12/07/17 0852  BP: 129/77 124/66 120/72 135/80  Pulse: (!) 115 (!) 114 97 94  Resp: 19 17 17    Temp: 98 F (36.7 C) 98.6 F (37 C) 98.4 F (36.9 C)   TempSrc: Oral Oral Oral   SpO2: 100% 99% 99% 100%  Weight:      Height:       Filed Weights   11/23/17 0227 11/28/17 0729  Weight: 86.8 kg 86.8 kg   Body mass index is 28.26 kg/m.   Exam  Awake Alert, Oriented X 3, No new F.N deficits, Normal affect [place].AT,PERRAL Supple Neck,No JVD, No cervical lymphadenopathy appriciated.  Symmetrical Chest wall movement, Good air movement bilaterally, CTAB RRR,No Gallops, Rubs or new Murmurs, No Parasternal Heave +ve B.Sounds, Abd Soft, No tenderness, No organomegaly appriciated, No rebound - guarding or rigidity. No Cyanosis, Clubbing or edema,  Wound Vac to both feet   I have personally reviewed following labs and imaging studies  LABORATORY DATA: CBC: Recent Labs  Lab 12/01/17 0322 12/04/17 0421  WBC 15.8* 13.6*  HGB 9.2* 9.9*  HCT 29.0* 31.5*  MCV 85.3 86.1  PLT 555* 573*    Basic Metabolic Panel: Recent Labs  Lab 12/01/17 0322 12/02/17 0312 12/03/17 1225 12/04/17 0421 12/05/17 0352 12/06/17 0335 12/07/17 0646  NA 139 140  --  141 138  --   --   K 4.0 3.7  --  4.0 4.0  --   --   CL 109 107  --  105 104  --   --   CO2 23 25  --  24 24  --   --   GLUCOSE 105* 114*  --  112* 96  --   --   BUN 14 14  --  15 20  --   --   CREATININE 1.38* 1.28* 1.24 1.45* 1.38* 1.35* 1.58*  CALCIUM 8.5* 8.5*  --  9.2 8.9  --   --     GFR: Estimated Creatinine Clearance: 69.8 mL/min (A) (by C-G formula based on SCr of 1.58 mg/dL (H)).  Liver Function Tests: Recent Labs  Lab 12/04/17 0421  AST 22  ALT 23  ALKPHOS 90  BILITOT 0.5    PROT 6.1*  ALBUMIN 2.6*   No results for input(s): LIPASE,  AMYLASE in the last 168 hours. No results for input(s): AMMONIA in the last 168 hours.  Coagulation Profile: No results for input(s): INR, PROTIME in the last 168 hours.  Cardiac Enzymes: No results for input(s): CKTOTAL, CKMB, CKMBINDEX, TROPONINI in the last 168 hours.  BNP (last 3 results) No results for input(s): PROBNP in the last 8760 hours.  HbA1C: No results for input(s): HGBA1C in the last 72 hours.  CBG: Recent Labs  Lab 12/06/17 1205 12/06/17 1655 12/06/17 2200 12/07/17 0749 12/07/17 1022  GLUCAP 401* 167* 144* 464* 193*    Lipid Profile: No results for input(s): CHOL, HDL, LDLCALC, TRIG, CHOLHDL, LDLDIRECT in the last 72 hours.  Thyroid Function Tests: No results for input(s): TSH, T4TOTAL, FREET4, T3FREE, THYROIDAB in the last 72 hours.  Anemia Panel: No results for input(s): VITAMINB12, FOLATE, FERRITIN, TIBC, IRON, RETICCTPCT in the last 72 hours.  Urine analysis:    Component Value Date/Time   COLORURINE STRAW (A) 11/21/2017 1320   APPEARANCEUR CLEAR 11/21/2017 1320   APPEARANCEUR Clear 04/11/2014 0138   LABSPEC 1.027 11/21/2017 1320   LABSPEC 1.026 04/11/2014 0138   PHURINE 6.0 11/21/2017 1320   GLUCOSEU >=500 (A) 11/21/2017 1320   GLUCOSEU >=500 04/11/2014 0138   HGBUR MODERATE (A) 11/21/2017 1320   BILIRUBINUR NEGATIVE 11/21/2017 1320   BILIRUBINUR neg 02/18/2017 0946   BILIRUBINUR Negative 04/11/2014 0138   KETONESUR 20 (A) 11/21/2017 1320   PROTEINUR 30 (A) 11/21/2017 1320   UROBILINOGEN 0.2 02/18/2017 0946   UROBILINOGEN 0.2 12/10/2013 1221   NITRITE NEGATIVE 11/21/2017 1320   LEUKOCYTESUR NEGATIVE 11/21/2017 1320   LEUKOCYTESUR Negative 04/11/2014 0138    Sepsis Labs: Lactic Acid, Venous    Component Value Date/Time   LATICACIDVEN 1.77 11/21/2017 1957    MICROBIOLOGY: Recent Results (from the past 240 hour(s))  Aerobic/Anaerobic Culture (surgical/deep wound)      Status: None   Collection Time: 11/29/17  7:41 AM  Result Value Ref Range Status   Specimen Description ABSCESS RIGHT FOOT  Final   Special Requests NONE  Final   Gram Stain   Final    MODERATE WBC PRESENT, PREDOMINANTLY PMN FEW GRAM POSITIVE COCCI RARE GRAM NEGATIVE RODS RARE GRAM POSITIVE RODS    Culture   Final    MODERATE LACTOBACILLUS SPECIES FEW CANDIDA ALBICANS NO ANAEROBES ISOLATED Performed at [facility], [place] [address] [place], [place] [address]    Report Status 12/04/2017 FINAL  Final  Aerobic Culture (superficial specimen)     Status: None   Collection Time: 11/29/17  7:46 AM  Result Value Ref Range Status   Specimen Description ABSCESS LEFT FOOT  Final   Special Requests NONE  Final   Gram Stain   Final    FEW WBC PRESENT, PREDOMINANTLY PMN NO ORGANISMS SEEN Performed at [facility], [place] [address] [place], [place] [address]    Culture RARE LACTOBACILLUS SPECIES  Final   Report Status 12/01/2017 FINAL  Final  Anaerobic culture     Status: None   Collection Time: 11/29/17  7:47 AM  Result Value Ref Range Status   Specimen Description ABSCESS LEFT ANKLE  Final   Special Requests NONE  Final   Gram Stain   Final    FEW WBC PRESENT, PREDOMINANTLY PMN NO ORGANISMS SEEN    Culture   Final    NO ANAEROBES ISOLATED Performed at [facility], [place] [address], [place], [place] [address]    Report Status 12/04/2017 FINAL  Final    RADIOLOGY STUDIES/RESULTS: Mr Foot Left W Wo Contrast  Result Date: 11/22/2017 CLINICAL DATA:  IV drug abuser who injects in his feet. Bilateral foot wounds for 1 week. Chills and malaise. EXAM: MRI OF THE LEFT FOREFOOT WITHOUT AND WITH CONTRAST TECHNIQUE: Multiplanar, multisequence MR imaging of the left forefoot was performed both before and after administration of intravenous contrast. CONTRAST:  8.5 cc Gadavist IV COMPARISON:  Plain films left foot 11/21/2017. FINDINGS: Bones/Joint/Cartilage There is partial  visualization of marrow edema in the medial malleolus with postcontrast enhancement. Bone marrow signal is otherwise normal. Ligaments Intact Muscles and Tendons No intramuscular fluid collection is identified. Soft tissues Diffuse subcutaneous edema enhancement are seen about the foot. There is an abscess in the dorsal subcutaneous tissues centered over the bases of the first through third metatarsals. The abscess measures 1.8 cm craniocaudal by 4.6 cm transverse by 4.4 cm long. A second abscess in the subcutaneous tissues measuring 2.5 cm long by 2.3 cm craniocaudal by 0.9 cm transverse is identified along the proximal first metatarsal. This abscess is centered 1.5 cm distal to the first tarsometatarsal joint. A third abscess which is incompletely visualized is seen in subcutaneous tissues 3.2 cm superior to the tip of the medial malleolus. This collection measures approximately 1.5 cm craniocaudal by 0.2 cm transverse. IMPRESSION: Three subcutaneous abscesses are identified as described above. The largest collection is in the dorsal soft tissues at the level of the proximal first and third metatarsals. Edema and enhancement the medial malleolus consistent with osteomyelitis. Electronically Signed   By: Inge Rise M.D.   On: 11/22/2017 13:01   Dg Foot Complete Left  Result Date: 11/21/2017 CLINICAL DATA:  Bilateral foot pain for 4-5 days, left-greater-than-right, swelling and open sores, the patient is diabetic, and has a smoking history EXAM: LEFT FOOT - COMPLETE 3+ VIEW COMPARISON:  None. FINDINGS: There is soft tissue swelling over the dorsum of the left foot. However no underlying focal demineralization or erosion is seen to indicate osteomyelitis. No acute abnormality is noted. Joint spaces appear normal. No erosion is seen. IMPRESSION: Soft tissue swelling over the dorsum of the foot. No underlying bony abnormality. Electronically Signed   By: Ivar Drape M.D.   On: 11/21/2017 15:30   Dg Foot  Complete Right  Result Date: 11/21/2017 CLINICAL DATA:  Bilateral foot pain for 4-5 days, left-greater-than-right with swelling and redness, the patient is diabetic and does have a smoking history EXAM: RIGHT FOOT COMPLETE - 3+ VIEW COMPARISON:  None FINDINGS: There is mild soft tissue swelling over the dorsum of the right foot. No underlying bony demineralization or erosion is seen to indicate active osteomyelitis. Alignment is normal and joint spaces appear normal. IMPRESSION: Soft tissue swelling over the dorsum. No evidence of active osteomyelitis. Electronically Signed   By: Ivar Drape M.D.   On: 11/21/2017 15:32   [place] Ekg Site Rite  Result Date: 11/22/2017 If Site Rite image not attached, placement could not be confirmed due to current cardiac rhythm.    LOS: 16 days   Lala Lund, MD  [facility]  If 7PM-7AM, please contact night-coverage  Please page via [email or web address] [email or web address]-click on MD name and type text message  12/07/2017, 11:04 AM

## 2017-12-07 NOTE — Progress Notes (Signed)
This nurse notes pt's blood sugar spiked up from 197 to 401 after receiving penicillin G this am.  Penicillin is mixed in dextrose.  Spoke with Sharyn Lull in pharmacy to query having med mixed in saline instead.  Awaiting next dose.  [id number]

## 2017-12-07 NOTE — Progress Notes (Signed)
Medication not available.  Primary MD aware.

## 2017-12-08 LAB — GLUCOSE, CAPILLARY
GLUCOSE-CAPILLARY: 78 mg/dL (ref 70–99)
Glucose-Capillary: 95 mg/dL (ref 70–99)
Glucose-Capillary: 154 mg/dL — ABNORMAL HIGH (ref 70–99)
Glucose-Capillary: 96 mg/dL (ref 70–99)

## 2017-12-08 LAB — CREATININE, SERUM
Creatinine, Ser: 1.66 mg/dL — ABNORMAL HIGH (ref 0.61–1.24)
GFR calc Af Amer: 59 mL/min — ABNORMAL LOW (ref >60)
GFR calc non Af Amer: 51 mL/min — ABNORMAL LOW (ref >60)

## 2017-12-08 MED ORDER — INSULIN ASPART PROT & ASPART (70-30 MIX) 100 UNIT/ML ~~LOC~~ SUSP
40.0000 [IU] | Freq: Two times a day (BID) | SUBCUTANEOUS | Status: DC
  Administered 2017-12-08 – 2017-12-10 (×4): 40 [IU] via SUBCUTANEOUS

## 2017-12-08 MED ORDER — HYDROMORPHONE HCL 1 MG/ML IJ SOLN
1.0000 mg | INTRAMUSCULAR | Status: DC | PRN
  Administered 2017-12-08 – 2017-12-09 (×4): 1 mg via INTRAVENOUS
  Filled 2017-12-08 (×4): qty 1

## 2017-12-08 MED ORDER — OXYCODONE HCL 5 MG PO TABS
15.0000 mg | ORAL_TABLET | ORAL | Status: DC | PRN
  Administered 2017-12-08 – 2017-12-12 (×17): 15 mg via ORAL
  Filled 2017-12-08 (×19): qty 3

## 2017-12-08 NOTE — Progress Notes (Signed)
Patient complaining of unrelieved pain in bilateral lower extremities. Patient stating he would rather get his PICC line removed and go home to be in pain then to be here with his pain not managed. Patient states the PRN Oxycodone does not work at all and the only thing that works is 1mg  Dilaudid, which was discontinued this morning. Patient has been verbalizing his pain 8-10/10 all day although on assessments he appears to be in no distress. Dr. Candiss Norse paged and notified of situation. Verbal orders received to place order for 1mg  Dilaudid Q4 PRN for severe pain. Dr. Candiss Norse stated he will address this issue in the morning. Will continue to monitor patient.

## 2017-12-08 NOTE — Progress Notes (Signed)
PROGRESS NOTE        [name] Age: 37 y.o. Sex: male Date of Birth: [birth year] Admit Date: 11/21/2017 Admitting Physician Patrecia Pour, MD EXH:[id number], [name], NP  Brief Narrative:   Patient is a 37 y.o. male with history of IVDA (relapse recently) insulin-dependent diabetes, hypertension who presented with sepsis secondary to soft tissue infection with abscesses of bilateral feet, osteomyelitis of the medial malleolus and actinomyces bacteremia.  Patient underwent debridement of bilateral feet, debridement of the left ankle and application of wound VAC on 9/13.  See below for further details.  Subjective: Patient in bed, appears comfortable and in distress, denies any headache, no fever, no chest pain or pressure, no shortness of breath , no abdominal pain. No focal weakness.  Assessment/Plan:  Sepsis secondary to bilateral lower extremity soft tissue infection with abscesses and osteomyelitis of the left medial malleolus along with actinomyces bacteremia: Orthopedics and ID following, currently on IV penicillin G, sepsis physiology has resolved, he is status post debridement by orthopedics on 11/29/2017, and wound VAC in both feet, orthopedics cleared for wound VAC was removed on 12/07/2017.  TTE and TEE done this admission are negative for any endocarditis.  He is supposed to get a minimum of 4- 6 weeks of IV antibiotics total thereafter possibly prolonged oral Augmentin or amoxicillin per ID.  Has a PICC line in place.  Due to his history of IV drug use he will be kept in the hospital for monitoring as no safe placement can be arranged.   History of IV heroin abuse.  Sensitively counseled to quit, at this point his wounds are chronic and at least 57 weeks old, wound VAC has been removed, minimal to no surrounding cellulitis, he is on pretty high dose of oral narcotics every 3 hours, on exam he appears to be in no distress and chewing tobacco,  but still requesting that his IV pain regimen be increased which I think is clinically not warranted at all, will adjust his oral pain medications and discontinue IV Dilaudid at this time.  Note if he was not an IV drug user he would have been treated at home with IV antibiotics at this point.   DKA in a patient with DM type II: Poor control outpatient with A1c of 12.1, DKA has resolved, he is currently on 70/30 insulin along with sliding scale, continue to monitor and adjust.  One abrupt rise in CBG on 12/07/2017 likely due to dietary noncompliance, extra NovoLog subcu given will continue to monitor.  CBG (last 3)  Recent Labs    12/07/17 1658 12/07/17 2232 12/08/17 0749  GLUCAP 225* 128* 95    CKD 3: Appears his baseline creatinine is anywhere between 1.3-1.4, monitor closely.  For now holding ACE inhibitor.  ARF has resolved.  Hypertension: BP well controlled on Norvasc will add scheduled hydralazine for better control, PRN hydralazine on board, holding ACE inhibitor due to mild ARF that he had developed.  Hypothyroidism: Continue levothyroxine  Peripheral neuropathy: Likely related to diabetes, continue Neurontin  Chronic hepatitis C: We will need outpatient follow-up at the ID clinic.  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  Claims he was clean for 2 years before his most recent relapse.  He has a history of numerous soft tissue infections  in his bilateral upper extremity-and apparently has a history of fasciotomy in his left upper extremity.  Has been counseled extensively.    DVT Prophylaxis: Prophylactic Lovenox  Code Status: Full code   Family Communication:  Girlfriend/wife at bedside on 12/07/2017  Disposition Plan:  Remain inpatient-given history of IV drug use-not a candidate for outpatient IV antimicrobial therapy, if he cannot be placed into SNF-will require several weeks of  inpatient hospitalization.  Antimicrobial agents: Anti-infectives (From admission, onward)   Start     Dose/Rate Route Frequency Ordered Stop   12/06/17 1630  sodium chloride 0.9 % 250 mL with penicillin G potassium 4,000,000 Units infusion     250 mL/hr  Intravenous Every 4 hours 12/06/17 1456     11/27/17 0730  ceFAZolin (ANCEF) IVPB 2g/100 mL premix  Status:  Discontinued     2 g 200 mL/hr over 30 Minutes Intravenous To Short Stay 11/27/17 0716 11/27/17 1317   11/25/17 1400  penicillin G potassium 4 Million Units in dextrose 5 % 250 mL IVPB  Status:  Discontinued     4 Million Units 250 mL/hr over 60 Minutes Intravenous Every 4 hours 11/25/17 1254 12/06/17 1600   11/25/17 0800  vancomycin (VANCOCIN) IVPB 1000 mg/200 mL premix  Status:  Discontinued     1,000 mg 200 mL/hr over 60 Minutes Intravenous Every 8 hours 11/25/17 0129 11/25/17 1413   11/24/17 0600  ceFAZolin (ANCEF) IVPB 2g/100 mL premix     2 g 200 mL/hr over 30 Minutes Intravenous On call to O.R. 11/23/17 2306 11/25/17 0559   11/23/17 1400  piperacillin-tazobactam (ZOSYN) IVPB 3.375 g  Status:  Discontinued     3.375 g 12.5 mL/hr over 240 Minutes Intravenous Every 8 hours 11/23/17 1031 11/25/17 1254   11/23/17 0000  metroNIDAZOLE (FLAGYL) IVPB 500 mg  Status:  Discontinued     500 mg 100 mL/hr over 60 Minutes Intravenous Every 8 hours 11/22/17 2312 11/23/17 1014   11/22/17 2315  cefTRIAXone (ROCEPHIN) 2 g in sodium chloride 0.9 % 100 mL IVPB  Status:  Discontinued     2 g 200 mL/hr over 30 Minutes Intravenous Every 24 hours 11/22/17 2312 11/23/17 1014   11/22/17 1400  piperacillin-tazobactam (ZOSYN) IVPB 3.375 g  Status:  Discontinued     3.375 g 12.5 mL/hr over 240 Minutes Intravenous Every 8 hours 11/22/17 1043 11/22/17 2311   11/22/17 1045  vancomycin (VANCOCIN) IVPB 750 mg/150 ml premix  Status:  Discontinued     750 mg 150 mL/hr over 60 Minutes Intravenous Every 12 hours 11/22/17 1043 11/25/17 0129   11/21/17 1830   vancomycin (VANCOCIN) IVPB 750 mg/150 ml premix     750 mg 150 mL/hr over 60 Minutes Intravenous  Once 11/21/17 1816 11/21/17 2030   11/21/17 1715  vancomycin (VANCOCIN) IVPB 1000 mg/200 mL premix     1,000 mg 200 mL/hr over 60 Minutes Intravenous  Once 11/21/17 1710 11/21/17 1914   11/21/17 1715  piperacillin-tazobactam (ZOSYN) IVPB 3.375 g     3.375 g 12.5 mL/hr over 240 Minutes Intravenous  Once 11/21/17 1710 11/21/17 2030      Procedures:  9/13>> debridement bilateral feet, debridement left ankle and application of wound VAC (Dr. Sharol Given)  9/12>> TEE  - No vegetations are seen. There is 1-2+ central aortic insufficiency. Mild LVH. Chiari network (normal variant) is present.  9/9>>TTE:  - Left ventricle: There was mild concentric hypertrophy. Systolic function was normal. The estimated ejection fraction was in the range of  60% to 65%. Left ventricular diastolic function parameters were normal. - Aortic valve: Transvalvular velocity was within the normal range. There was no stenosis. There was mild regurgitation. Valve area (VTI): 2.83 cm^2. Valve area (Vmax): 2.61 cm^2. Valve area (Vmean): 2.72 cm^2. Regurgitation pressure half-time: 944 ms. - Left atrium: The atrium was mildly dilated. - Atrial septum: No defect or patent foramen ovale was identified  by color flow Doppler. - Tricuspid valve: There was mild regurgitation. - Pulmonic valve: There was moderate regurgitation. - Pulmonary arteries: Systolic pressure was within the normal  range. PA peak pressure: 30 mm Hg (S). - Pericardium, extracardiac: A trivial pericardial effusion was  identified.  CONSULTS:  ID and orthopedic surgery  Time spent: 25- minutes-Greater than 50% of this time was spent in counseling, explanation of diagnosis, planning of further management, and coordination of care.  MEDICATIONS: Scheduled Meds: . amLODipine  10 mg Oral Daily  . docusate sodium  100 mg Oral BID  . enoxaparin (LOVENOX) injection  40  mg Subcutaneous Daily  . famotidine  20 mg Oral BID  . gabapentin  400 mg Oral TID  . hepatitis b vaccine  1 mL Intramuscular Once  . hydrALAZINE  50 mg Oral Q8H  . insulin aspart  0-15 Units Subcutaneous TID WC  . insulin aspart  0-5 Units Subcutaneous QHS  . insulin aspart  3 Units Subcutaneous TID WC  . insulin aspart protamine- aspart  40 Units Subcutaneous BID WC  . levothyroxine  50 mcg Oral QAC breakfast  . silver sulfADIAZINE   Topical Daily  . traZODone  50 mg Oral QHS   Continuous Infusions: . sodium chloride 0.9 % 250 mL with penicillin G potassium 4,000,000 Units infusion 250 mL/hr at 12/08/17 0824   PRN Meds:.acetaminophen, bisacodyl, dextrose, hydrALAZINE, HYDROmorphone (DILAUDID) injection, magnesium citrate, [DISCONTINUED] ondansetron **OR** ondansetron (ZOFRAN) IV, oxyCODONE, polyethylene glycol, sodium chloride flush, traMADol   PHYSICAL EXAM: Vital signs: Vitals:   12/07/17 1326 12/07/17 2057 12/08/17 0512 12/08/17 0824  BP: 131/75 117/83 127/78 132/83  Pulse: (!) 111 (!) 110 94 96  Resp: 20     Temp:  98.3 F (36.8 C) 97.9 F (36.6 C)   TempSrc:  Oral Oral   SpO2: 99% 100% 99%   Weight:      Height:       Filed Weights   11/23/17 0227 11/28/17 0729  Weight: 86.8 kg 86.8 kg   Body mass index is 28.26 kg/m.   Exam  Awake Alert, Oriented X 3, No new F.N deficits, in no distress and appears comfortable [place].AT,PERRAL Supple Neck,No JVD, No cervical lymphadenopathy appriciated.  Symmetrical Chest wall movement, Good air movement bilaterally, CTAB RRR,No Gallops, Rubs or new Murmurs, No Parasternal Heave +ve B.Sounds, Abd Soft, No tenderness, No organomegaly appriciated, No rebound - guarding or rigidity. No Cyanosis, Clubbing or edema, both feet and a bandage   I have personally reviewed following labs and imaging studies  LABORATORY DATA: CBC: Recent Labs  Lab 12/04/17 0421  WBC 13.6*  HGB 9.9*  HCT 31.5*  MCV 86.1  PLT 573*    Basic  Metabolic Panel: Recent Labs  Lab 12/02/17 0312  12/04/17 0421 12/05/17 0352 12/06/17 0335 12/07/17 0646 12/08/17 0310  NA 140  --  141 138  --   --   --   K 3.7  --  4.0 4.0  --   --   --   CL 107  --  105 104  --   --   --  CO2 25  --  24 24  --   --   --   GLUCOSE 114*  --  112* 96  --   --   --   BUN 14  --  15 20  --   --   --   CREATININE 1.28*   < > 1.45* 1.38* 1.35* 1.58* 1.66*  CALCIUM 8.5*  --  9.2 8.9  --   --   --    < > = values in this interval not displayed.    GFR: Estimated Creatinine Clearance: 66.4 mL/min (A) (by C-G formula based on SCr of 1.66 mg/dL (H)).  Liver Function Tests: Recent Labs  Lab 12/04/17 0421  AST 22  ALT 23  ALKPHOS 90  BILITOT 0.5  PROT 6.1*  ALBUMIN 2.6*   No results for input(s): LIPASE, AMYLASE in the last 168 hours. No results for input(s): AMMONIA in the last 168 hours.  Coagulation Profile: No results for input(s): INR, PROTIME in the last 168 hours.  Cardiac Enzymes: No results for input(s): CKTOTAL, CKMB, CKMBINDEX, TROPONINI in the last 168 hours.  BNP (last 3 results) No results for input(s): PROBNP in the last 8760 hours.  HbA1C: No results for input(s): HGBA1C in the last 72 hours.  CBG: Recent Labs  Lab 12/07/17 1022 12/07/17 1134 12/07/17 1658 12/07/17 2232 12/08/17 0749  GLUCAP 193* 75 225* 128* 95    Lipid Profile: No results for input(s): CHOL, HDL, LDLCALC, TRIG, CHOLHDL, LDLDIRECT in the last 72 hours.  Thyroid Function Tests: No results for input(s): TSH, T4TOTAL, FREET4, T3FREE, THYROIDAB in the last 72 hours.  Anemia Panel: No results for input(s): VITAMINB12, FOLATE, FERRITIN, TIBC, IRON, RETICCTPCT in the last 72 hours.  Urine analysis:    Component Value Date/Time   COLORURINE STRAW (A) 11/21/2017 1320   APPEARANCEUR CLEAR 11/21/2017 1320   APPEARANCEUR Clear 04/11/2014 0138   LABSPEC 1.027 11/21/2017 1320   LABSPEC 1.026 04/11/2014 0138   PHURINE 6.0 11/21/2017 1320    GLUCOSEU >=500 (A) 11/21/2017 1320   GLUCOSEU >=500 04/11/2014 0138   HGBUR MODERATE (A) 11/21/2017 1320   BILIRUBINUR NEGATIVE 11/21/2017 1320   BILIRUBINUR neg 02/18/2017 0946   BILIRUBINUR Negative 04/11/2014 0138   KETONESUR 20 (A) 11/21/2017 1320   PROTEINUR 30 (A) 11/21/2017 1320   UROBILINOGEN 0.2 02/18/2017 0946   UROBILINOGEN 0.2 12/10/2013 1221   NITRITE NEGATIVE 11/21/2017 1320   LEUKOCYTESUR NEGATIVE 11/21/2017 1320   LEUKOCYTESUR Negative 04/11/2014 0138    Sepsis Labs: Lactic Acid, Venous    Component Value Date/Time   LATICACIDVEN 1.77 11/21/2017 1957    MICROBIOLOGY: Recent Results (from the past 240 hour(s))  Aerobic/Anaerobic Culture (surgical/deep wound)     Status: None   Collection Time: 11/29/17  7:41 AM  Result Value Ref Range Status   Specimen Description ABSCESS RIGHT FOOT  Final   Special Requests NONE  Final   Gram Stain   Final    MODERATE WBC PRESENT, PREDOMINANTLY PMN FEW GRAM POSITIVE COCCI RARE GRAM NEGATIVE RODS RARE GRAM POSITIVE RODS    Culture   Final    MODERATE LACTOBACILLUS SPECIES FEW CANDIDA ALBICANS NO ANAEROBES ISOLATED Performed at [facility], [place] [address], [place], [place] [address]    Report Status 12/04/2017 FINAL  Final  Aerobic Culture (superficial specimen)     Status: None   Collection Time: 11/29/17  7:46 AM  Result Value Ref Range Status   Specimen Description ABSCESS LEFT FOOT  Final  Special Requests NONE  Final   Gram Stain   Final    FEW WBC PRESENT, PREDOMINANTLY PMN NO ORGANISMS SEEN Performed at [facility], [address] [place], [place] [address]    Culture RARE LACTOBACILLUS SPECIES  Final   Report Status 12/01/2017 FINAL  Final  Anaerobic culture     Status: None   Collection Time: 11/29/17  7:47 AM  Result Value Ref Range Status   Specimen Description ABSCESS LEFT ANKLE  Final   Special Requests NONE  Final   Gram Stain   Final    FEW WBC PRESENT, PREDOMINANTLY PMN NO  ORGANISMS SEEN    Culture   Final    NO ANAEROBES ISOLATED Performed at [facility], [place] [address], [place], [place] [address]    Report Status 12/04/2017 FINAL  Final    RADIOLOGY STUDIES/RESULTS: Mr Foot Left W Wo Contrast  Result Date: 11/22/2017 CLINICAL DATA:  IV drug abuser who injects in his feet. Bilateral foot wounds for 1 week. Chills and malaise. EXAM: MRI OF THE LEFT FOREFOOT WITHOUT AND WITH CONTRAST TECHNIQUE: Multiplanar, multisequence MR imaging of the left forefoot was performed both before and after administration of intravenous contrast. CONTRAST:  8.5 cc Gadavist IV COMPARISON:  Plain films left foot 11/21/2017. FINDINGS: Bones/Joint/Cartilage There is partial visualization of marrow edema in the medial malleolus with postcontrast enhancement. Bone marrow signal is otherwise normal. Ligaments Intact Muscles and Tendons No intramuscular fluid collection is identified. Soft tissues Diffuse subcutaneous edema enhancement are seen about the foot. There is an abscess in the dorsal subcutaneous tissues centered over the bases of the first through third metatarsals. The abscess measures 1.8 cm craniocaudal by 4.6 cm transverse by 4.4 cm long. A second abscess in the subcutaneous tissues measuring 2.5 cm long by 2.3 cm craniocaudal by 0.9 cm transverse is identified along the proximal first metatarsal. This abscess is centered 1.5 cm distal to the first tarsometatarsal joint. A third abscess which is incompletely visualized is seen in subcutaneous tissues 3.2 cm superior to the tip of the medial malleolus. This collection measures approximately 1.5 cm craniocaudal by 0.2 cm transverse. IMPRESSION: Three subcutaneous abscesses are identified as described above. The largest collection is in the dorsal soft tissues at the level of the proximal first and third metatarsals. Edema and enhancement the medial malleolus consistent with osteomyelitis. Electronically Signed   By: Inge Rise  M.D.   On: 11/22/2017 13:01   Dg Foot Complete Left  Result Date: 11/21/2017 CLINICAL DATA:  Bilateral foot pain for 4-5 days, left-greater-than-right, swelling and open sores, the patient is diabetic, and has a smoking history EXAM: LEFT FOOT - COMPLETE 3+ VIEW COMPARISON:  None. FINDINGS: There is soft tissue swelling over the dorsum of the left foot. However no underlying focal demineralization or erosion is seen to indicate osteomyelitis. No acute abnormality is noted. Joint spaces appear normal. No erosion is seen. IMPRESSION: Soft tissue swelling over the dorsum of the foot. No underlying bony abnormality. Electronically Signed   By: Ivar Drape M.D.   On: 11/21/2017 15:30   Dg Foot Complete Right  Result Date: 11/21/2017 CLINICAL DATA:  Bilateral foot pain for 4-5 days, left-greater-than-right with swelling and redness, the patient is diabetic and does have a smoking history EXAM: RIGHT FOOT COMPLETE - 3+ VIEW COMPARISON:  None FINDINGS: There is mild soft tissue swelling over the dorsum of the right foot. No underlying bony demineralization or erosion is seen to indicate  active osteomyelitis. Alignment is normal and joint spaces appear normal. IMPRESSION: Soft tissue swelling over the dorsum. No evidence of active osteomyelitis. Electronically Signed   By: Ivar Drape M.D.   On: 11/21/2017 15:32   [place] Ekg Site Rite  Result Date: 11/22/2017 If Site Rite image not attached, placement could not be confirmed due to current cardiac rhythm.    LOS: 24 days   Lala Lund, MD  [facility]  If 7PM-7AM, please contact night-coverage  Please page via [email or web address]-click on MD name and type text message  12/08/2017, 10:02 AM

## 2017-12-09 LAB — GLUCOSE, CAPILLARY
GLUCOSE-CAPILLARY: 69 mg/dL — AB (ref 70–99)
GLUCOSE-CAPILLARY: 132 mg/dL — AB (ref 70–99)
GLUCOSE-CAPILLARY: 48 mg/dL — AB (ref 70–99)
Glucose-Capillary: 61 mg/dL — ABNORMAL LOW (ref 70–99)
Glucose-Capillary: 63 mg/dL — ABNORMAL LOW (ref 70–99)
Glucose-Capillary: 71 mg/dL (ref 70–99)
Glucose-Capillary: 256 mg/dL — ABNORMAL HIGH (ref 70–99)
Glucose-Capillary: 74 mg/dL (ref 70–99)

## 2017-12-09 LAB — CREATININE, SERUM
CREATININE: 1.52 mg/dL — AB (ref 0.61–1.24)
GFR calc Af Amer: >60 mL/min (ref >60)
GFR calc non Af Amer: 57 mL/min — ABNORMAL LOW (ref >60)

## 2017-12-09 MED ORDER — HYDROMORPHONE HCL 1 MG/ML IJ SOLN
1.0000 mg | Freq: Four times a day (QID) | INTRAMUSCULAR | Status: DC | PRN
  Administered 2017-12-09 – 2017-12-10 (×4): 1 mg via INTRAVENOUS
  Filled 2017-12-09 (×4): qty 1

## 2017-12-09 MED ORDER — LACTATED RINGERS IV SOLN
INTRAVENOUS | Status: AC
  Administered 2017-12-09: 13:00:00 via INTRAVENOUS

## 2017-12-09 NOTE — Progress Notes (Signed)
Hypoglycemic Event  CBG: 48  Treatment: 15 GM carbohydrate snack  Symptoms: Shaky  Follow-up CBG: Time:2240 CBG Result:74  Possible Reasons for Event: Inadequate meal intake; patient claims that he did not even eat half of his dinner  Comments/MD notified: [place],NP; No new orders placed; Will continue to monitor and treat per MD orders.    [place]

## 2017-12-09 NOTE — Progress Notes (Addendum)
Patient requesting pain medication, Dilaudid @ 1545. Not due until 1630. Offered Oxy to patient (who at the time was sitting up on the bed smiling). He decided to wait for the Dilaudid.

## 2017-12-09 NOTE — Progress Notes (Signed)
PROGRESS NOTE        [name] Age: 37 y.o. Sex: male Date of Birth: 1980/07/30 Admit Date: 11/21/2017 Admitting Physician Patrecia Pour, MD [id number], [name], NP  Brief Narrative:   Patient is a 37 y.o. male with history of IVDA (relapse recently) insulin-dependent diabetes, hypertension who presented with sepsis secondary to soft tissue infection with abscesses of bilateral feet, osteomyelitis of the medial malleolus and actinomyces bacteremia.  Patient underwent debridement of bilateral feet, debridement of the left ankle and application of wound VAC on 9/13.  See below for further details.  Subjective:  Patient in bed, appears comfortable, denies any headache, no fever, no chest pain or pressure, no shortness of breath , no abdominal pain. No focal weakness.  He is currently in no distress and watching TV comfortably but still wants IV Dilaudid dose to be increased.   Assessment/Plan:  Sepsis secondary to bilateral lower extremity soft tissue infection with abscesses and osteomyelitis of the left medial malleolus along with actinomyces bacteremia: Orthopedics and ID following, currently on IV penicillin G, sepsis physiology has resolved, he is status post debridement by orthopedics on 11/29/2017, and wound VAC in both feet, orthopedics cleared for wound VAC was removed on 12/07/2017.  TTE and TEE done this admission are negative for any endocarditis.  He is supposed to get a minimum of 4weeks of IV antibiotics total thereafter possibly prolonged oral Augmentin or amoxicillin per ID.  Has a PICC line in place.  Due to his history of IV drug use he will be kept in the hospital for monitoring as no safe placement can be arranged.   History of IV heroin abuse.  Sensitively counseled to quit, at this point his wounds are chronic and at least 37 weeks old, wound VAC has been removed, minimal to no surrounding cellulitis, he is on pretty high dose  of oral narcotics every 3 hours, on exam he appears to be in no distress and chewing tobacco, but still requesting that his IV pain Dilaudid to be increased.  Note if he was not an IV drug user he would have been treated at home with IV antibiotics at this point.  Also had detailed discussions with staff on 12/09/2017, he appears to be in no distress to any staff member at any point in time, he is in fact asking for IV Dilaudid to be pushed at the due time and then requesting the staff to flush it fast, his behavior is highly suspicious for narcotic seeking behavior.  Will also request Dr. Sharol Given on 12/09/2017 to reevaluate and reassess his wound.   DKA in a patient with DM type II: Poor control outpatient with A1c of 12.1, DKA has resolved, he is currently on 70/30 insulin along with sliding scale, continue to monitor and adjust.  One abrupt rise in CBG on 12/07/2017 likely due to dietary noncompliance, extra NovoLog subcu given will continue to monitor.  CBG (last 3)  Recent Labs    12/08/17 1637 12/08/17 2107 12/09/17 0753  GLUCAP 96 78 132*    CKD 3: Appears his baseline creatinine is anywhere between 1.3-1.4, monitor closely.  For now holding ACE inhibitor.  ARF has resolved.  Hypertension: BP well controlled on Norvasc will add scheduled hydralazine for better control, PRN hydralazine on board, holding ACE inhibitor due to mild ARF that  he had developed.  Hypothyroidism: Continue levothyroxine  Peripheral neuropathy: Likely related to diabetes, continue Neurontin  Chronic hepatitis C: We will need outpatient follow-up at the ID clinic.  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  Claims he was clean for 2 years before his most recent relapse.  He has a history of numerous soft tissue infections in his bilateral upper extremity-and apparently has a history of fasciotomy in his left upper extremity.  Has been  counseled extensively.    DVT Prophylaxis: Prophylactic Lovenox  Code Status: Full code   Family Communication:  Girlfriend/wife at bedside on 12/07/2017  Disposition Plan:  Remain inpatient-given history of IV drug use-not a candidate for outpatient IV antimicrobial therapy, if he cannot be placed into SNF-will require several weeks of inpatient hospitalization.  Antimicrobial agents: Anti-infectives (From admission, onward)   Start     Dose/Rate Route Frequency Ordered Stop   12/06/17 1630  sodium chloride 0.9 % 250 mL with penicillin G potassium 4,000,000 Units infusion     250 mL/hr  Intravenous Every 4 hours 12/06/17 1456     11/27/17 0730  ceFAZolin (ANCEF) IVPB 2g/100 mL premix  Status:  Discontinued     2 g 200 mL/hr over 30 Minutes Intravenous To Short Stay 11/27/17 0716 11/27/17 1317   11/25/17 1400  penicillin G potassium 4 Million Units in dextrose 5 % 250 mL IVPB  Status:  Discontinued     4 Million Units 250 mL/hr over 60 Minutes Intravenous Every 4 hours 11/25/17 1254 12/06/17 1600   11/25/17 0800  vancomycin (VANCOCIN) IVPB 1000 mg/200 mL premix  Status:  Discontinued     1,000 mg 200 mL/hr over 60 Minutes Intravenous Every 8 hours 11/25/17 0129 11/25/17 1413   11/24/17 0600  ceFAZolin (ANCEF) IVPB 2g/100 mL premix     2 g 200 mL/hr over 30 Minutes Intravenous On call to O.R. 11/23/17 2306 11/25/17 0559   11/23/17 1400  piperacillin-tazobactam (ZOSYN) IVPB 3.375 g  Status:  Discontinued     3.375 g 12.5 mL/hr over 240 Minutes Intravenous Every 8 hours 11/23/17 1031 11/25/17 1254   11/23/17 0000  metroNIDAZOLE (FLAGYL) IVPB 500 mg  Status:  Discontinued     500 mg 100 mL/hr over 60 Minutes Intravenous Every 8 hours 11/22/17 2312 11/23/17 1014   11/22/17 2315  cefTRIAXone (ROCEPHIN) 2 g in sodium chloride 0.9 % 100 mL IVPB  Status:  Discontinued     2 g 200 mL/hr over 30 Minutes Intravenous Every 24 hours 11/22/17 2312 11/23/17 1014   11/22/17 1400   piperacillin-tazobactam (ZOSYN) IVPB 3.375 g  Status:  Discontinued     3.375 g 12.5 mL/hr over 240 Minutes Intravenous Every 8 hours 11/22/17 1043 11/22/17 2311   11/22/17 1045  vancomycin (VANCOCIN) IVPB 750 mg/150 ml premix  Status:  Discontinued     750 mg 150 mL/hr over 60 Minutes Intravenous Every 12 hours 11/22/17 1043 11/25/17 0129   11/21/17 1830  vancomycin (VANCOCIN) IVPB 750 mg/150 ml premix     750 mg 150 mL/hr over 60 Minutes Intravenous  Once 11/21/17 1816 11/21/17 2030   11/21/17 1715  vancomycin (VANCOCIN) IVPB 1000 mg/200 mL premix     1,000 mg 200 mL/hr over 60 Minutes Intravenous  Once 11/21/17 1710 11/21/17 1914   11/21/17 1715  piperacillin-tazobactam (ZOSYN) IVPB 3.375 g     3.375 g 12.5 mL/hr over 240 Minutes Intravenous  Once 11/21/17 1710 11/21/17 2030  Procedures:  9/13>> debridement bilateral feet, debridement left ankle and application of wound VAC (Dr. Sharol Given)  9/12>> TEE  - No vegetations are seen. There is 1-2+ central aortic insufficiency. Mild LVH. Chiari network (normal variant) is present.  9/9>>TTE:  - Left ventricle: There was mild concentric hypertrophy. Systolic function was normal. The estimated ejection fraction was in the range of 60% to 65%. Left ventricular diastolic function parameters were normal. - Aortic valve: Transvalvular velocity was within the normal range. There was no stenosis. There was mild regurgitation. Valve area (VTI): 2.83 cm^2. Valve area (Vmax): 2.61 cm^2. Valve area (Vmean): 2.72 cm^2. Regurgitation pressure half-time: 944 ms. - Left atrium: The atrium was mildly dilated. - Atrial septum: No defect or patent foramen ovale was identified  by color flow Doppler. - Tricuspid valve: There was mild regurgitation. - Pulmonic valve: There was moderate regurgitation. - Pulmonary arteries: Systolic pressure was within the normal  range. PA peak pressure: 30 mm Hg (S). - Pericardium, extracardiac: A trivial pericardial effusion  was  identified.  CONSULTS:  ID and orthopedic surgery  Time spent: 25- minutes-Greater than 50% of this time was spent in counseling, explanation of diagnosis, planning of further management, and coordination of care.  MEDICATIONS: Scheduled Meds: . amLODipine  10 mg Oral Daily  . docusate sodium  100 mg Oral BID  . enoxaparin (LOVENOX) injection  40 mg Subcutaneous Daily  . famotidine  20 mg Oral BID  . gabapentin  400 mg Oral TID  . hepatitis b vaccine  1 mL Intramuscular Once  . hydrALAZINE  50 mg Oral Q8H  . insulin aspart  0-15 Units Subcutaneous TID WC  . insulin aspart  0-5 Units Subcutaneous QHS  . insulin aspart  3 Units Subcutaneous TID WC  . insulin aspart protamine- aspart  40 Units Subcutaneous BID WC  . levothyroxine  50 mcg Oral QAC breakfast  . silver sulfADIAZINE   Topical Daily  . traZODone  50 mg Oral QHS   Continuous Infusions: . lactated ringers    . sodium chloride 0.9 % 250 mL with penicillin G potassium 4,000,000 Units infusion 250 mL/hr at 12/09/17 0839   PRN Meds:.acetaminophen, bisacodyl, dextrose, hydrALAZINE, HYDROmorphone (DILAUDID) injection, magnesium citrate, [DISCONTINUED] ondansetron **OR** ondansetron (ZOFRAN) IV, oxyCODONE, polyethylene glycol, sodium chloride flush, traMADol   PHYSICAL EXAM: Vital signs: Vitals:   12/08/17 0824 12/08/17 2105 12/09/17 0607 12/09/17 0834  BP: 132/83 137/80 134/63 126/71  Pulse: 96 (!) 107 95 96  Resp:   14   Temp:  98.3 F (36.8 C) 98.9 F (37.2 C)   TempSrc:  Oral Oral   SpO2:  98% 97%   Weight:      Height:       Filed Weights   11/23/17 0227 11/28/17 0729  Weight: 86.8 kg 86.8 kg   Body mass index is 28.26 kg/m.   Exam  Awake Alert, Oriented X 3, No new F.N deficits, in no distress [place].AT,PERRAL Supple Neck,No JVD, No cervical lymphadenopathy appriciated.  Symmetrical Chest wall movement, Good air movement bilaterally, CTAB RRR,No Gallops, Rubs or new Murmurs, No Parasternal Heave +ve  B.Sounds, Abd Soft, No tenderness, No organomegaly appriciated, No rebound - guarding or rigidity. No Cyanosis, Clubbing or edema,  both feet and a bandage   I have personally reviewed following labs and imaging studies  LABORATORY DATA: CBC: Recent Labs  Lab 12/04/17 0421  WBC 13.6*  HGB 9.9*  HCT 31.5*  MCV 86.1  PLT 573*    Basic  Metabolic Panel: Recent Labs  Lab 12/04/17 0421 12/05/17 0352 12/06/17 0335 12/07/17 0646 12/08/17 0310  NA 141 138  --   --   --   K 4.0 4.0  --   --   --   CL 105 104  --   --   --   CO2 24 24  --   --   --   GLUCOSE 112* 96  --   --   --   BUN 15 20  --   --   --   CREATININE 1.45* 1.38* 1.35* 1.58* 1.66*  CALCIUM 9.2 8.9  --   --   --     GFR: Estimated Creatinine Clearance: 66.4 mL/min (A) (by C-G formula based on SCr of 1.66 mg/dL (H)).  Liver Function Tests: Recent Labs  Lab 12/04/17 0421  AST 22  ALT 23  ALKPHOS 90  BILITOT 0.5  PROT 6.1*  ALBUMIN 2.6*   No results for input(s): LIPASE, AMYLASE in the last 168 hours. No results for input(s): AMMONIA in the last 168 hours.  Coagulation Profile: No results for input(s): INR, PROTIME in the last 168 hours.  Cardiac Enzymes: No results for input(s): CKTOTAL, CKMB, CKMBINDEX, TROPONINI in the last 168 hours.  BNP (last 3 results) No results for input(s): PROBNP in the last 8760 hours.  HbA1C: No results for input(s): HGBA1C in the last 72 hours.  CBG: Recent Labs  Lab 12/08/17 0749 12/08/17 1128 12/08/17 1637 12/08/17 2107 12/09/17 0753  GLUCAP 95 154* 96 78 132*    Lipid Profile: No results for input(s): CHOL, HDL, LDLCALC, TRIG, CHOLHDL, LDLDIRECT in the last 72 hours.  Thyroid Function Tests: No results for input(s): TSH, T4TOTAL, FREET4, T3FREE, THYROIDAB in the last 72 hours.  Anemia Panel: No results for input(s): VITAMINB12, FOLATE, FERRITIN, TIBC, IRON, RETICCTPCT in the last 72 hours.  Urine analysis:    Component Value Date/Time    COLORURINE STRAW (A) 11/21/2017 1320   APPEARANCEUR CLEAR 11/21/2017 1320   APPEARANCEUR Clear 04/11/2014 0138   LABSPEC 1.027 11/21/2017 1320   LABSPEC 1.026 04/11/2014 0138   PHURINE 6.0 11/21/2017 1320   GLUCOSEU >=500 (A) 11/21/2017 1320   GLUCOSEU >=500 04/11/2014 0138   HGBUR MODERATE (A) 11/21/2017 1320   BILIRUBINUR NEGATIVE 11/21/2017 1320   BILIRUBINUR neg 02/18/2017 0946   BILIRUBINUR Negative 04/11/2014 0138   KETONESUR 20 (A) 11/21/2017 1320   PROTEINUR 30 (A) 11/21/2017 1320   UROBILINOGEN 0.2 02/18/2017 0946   UROBILINOGEN 0.2 12/10/2013 1221   NITRITE NEGATIVE 11/21/2017 1320   LEUKOCYTESUR NEGATIVE 11/21/2017 1320   LEUKOCYTESUR Negative 04/11/2014 0138    Sepsis Labs: Lactic Acid, Venous    Component Value Date/Time   LATICACIDVEN 1.77 11/21/2017 1957    MICROBIOLOGY: No results found for this or any previous visit (from the past 240 hour(s)).  RADIOLOGY STUDIES/RESULTS: Mr Foot Left W Wo Contrast  Result Date: 11/22/2017 CLINICAL DATA:  IV drug abuser who injects in his feet. Bilateral foot wounds for 1 week. Chills and malaise. EXAM: MRI OF THE LEFT FOREFOOT WITHOUT AND WITH CONTRAST TECHNIQUE: Multiplanar, multisequence MR imaging of the left forefoot was performed both before and after administration of intravenous contrast. CONTRAST:  8.5 cc Gadavist IV COMPARISON:  Plain films left foot 11/21/2017. FINDINGS: Bones/Joint/Cartilage There is partial visualization of marrow edema in the medial malleolus with postcontrast enhancement. Bone marrow signal is otherwise normal. Ligaments Intact Muscles and Tendons No intramuscular fluid collection is identified. Soft tissues Diffuse subcutaneous  edema enhancement are seen about the foot. There is an abscess in the dorsal subcutaneous tissues centered over the bases of the first through third metatarsals. The abscess measures 1.8 cm craniocaudal by 4.6 cm transverse by 4.4 cm long. A second abscess in the subcutaneous  tissues measuring 2.5 cm long by 2.3 cm craniocaudal by 0.9 cm transverse is identified along the proximal first metatarsal. This abscess is centered 1.5 cm distal to the first tarsometatarsal joint. A third abscess which is incompletely visualized is seen in subcutaneous tissues 3.2 cm superior to the tip of the medial malleolus. This collection measures approximately 1.5 cm craniocaudal by 0.2 cm transverse. IMPRESSION: Three subcutaneous abscesses are identified as described above. The largest collection is in the dorsal soft tissues at the level of the proximal first and third metatarsals. Edema and enhancement the medial malleolus consistent with osteomyelitis. Electronically Signed   By: Inge Rise M.D.   On: 11/22/2017 13:01   Dg Foot Complete Left  Result Date: 11/21/2017 CLINICAL DATA:  Bilateral foot pain for 4-5 days, left-greater-than-right, swelling and open sores, the patient is diabetic, and has a smoking history EXAM: LEFT FOOT - COMPLETE 3+ VIEW COMPARISON:  None. FINDINGS: There is soft tissue swelling over the dorsum of the left foot. However no underlying focal demineralization or erosion is seen to indicate osteomyelitis. No acute abnormality is noted. Joint spaces appear normal. No erosion is seen. IMPRESSION: Soft tissue swelling over the dorsum of the foot. No underlying bony abnormality. Electronically Signed   By: Ivar Drape M.D.   On: 11/21/2017 15:30   Dg Foot Complete Right  Result Date: 11/21/2017 CLINICAL DATA:  Bilateral foot pain for 4-5 days, left-greater-than-right with swelling and redness, the patient is diabetic and does have a smoking history EXAM: RIGHT FOOT COMPLETE - 3+ VIEW COMPARISON:  None FINDINGS: There is mild soft tissue swelling over the dorsum of the right foot. No underlying bony demineralization or erosion is seen to indicate active osteomyelitis. Alignment is normal and joint spaces appear normal. IMPRESSION: Soft tissue swelling over the dorsum. No  evidence of active osteomyelitis. Electronically Signed   By: Ivar Drape M.D.   On: 11/21/2017 15:32   [place] Ekg Site Rite  Result Date: 11/22/2017 If Site Rite image not attached, placement could not be confirmed due to current cardiac rhythm.    LOS: 18 days   Lala Lund, MD  [facility]  If 7PM-7AM, please contact night-coverage  Please page via [email or web address] [email or web address]-click on MD name and type text message  12/09/2017, 9:56 AM

## 2017-12-10 LAB — CBC
HEMATOCRIT: 30.2 % — AB (ref 39.0–52.0)
Hemoglobin: 9.6 g/dL — ABNORMAL LOW (ref 13.0–17.0)
MCH: 27.7 pg (ref 26.0–34.0)
MCHC: 31.8 g/dL (ref 30.0–36.0)
MCV: 87.3 fL (ref 78.0–100.0)
Platelets: 428 10*3/uL — ABNORMAL HIGH (ref 150–400)
RBC: 3.46 MIL/uL — ABNORMAL LOW (ref 4.22–5.81)
RDW: 15 % (ref 11.5–15.5)
WBC: 10 10*3/uL (ref 4.0–10.5)

## 2017-12-10 LAB — GLUCOSE, CAPILLARY
GLUCOSE-CAPILLARY: 360 mg/dL — AB (ref 70–99)
Glucose-Capillary: 276 mg/dL — ABNORMAL HIGH (ref 70–99)
Glucose-Capillary: 111 mg/dL — ABNORMAL HIGH (ref 70–99)
Glucose-Capillary: 102 mg/dL — ABNORMAL HIGH (ref 70–99)

## 2017-12-10 LAB — COMPREHENSIVE METABOLIC PANEL
ALT: 25 U/L (ref 0–44)
AST: 23 U/L (ref 15–41)
Albumin: 2.6 g/dL — ABNORMAL LOW (ref 3.5–5.0)
Alkaline Phosphatase: 93 U/L (ref 38–126)
Anion gap: 7 (ref 5–15)
BILIRUBIN TOTAL: 0.4 mg/dL (ref 0.3–1.2)
BUN: 12 mg/dL (ref 6–20)
CALCIUM: 9 mg/dL (ref 8.9–10.3)
CO2: 23 mmol/L (ref 22–32)
Chloride: 106 mmol/L (ref 98–111)
Creatinine, Ser: 1.34 mg/dL — ABNORMAL HIGH (ref 0.61–1.24)
GFR calc Af Amer: >60 mL/min (ref >60)
Glucose, Bld: 266 mg/dL — ABNORMAL HIGH (ref 70–99)
Potassium: 4.9 mmol/L (ref 3.5–5.1)
Sodium: 136 mmol/L (ref 135–145)
TOTAL PROTEIN: 5.9 g/dL — AB (ref 6.5–8.1)

## 2017-12-10 LAB — MAGNESIUM: MAGNESIUM: 1.8 mg/dL (ref 1.7–2.4)

## 2017-12-10 MED ORDER — SILVER SULFADIAZINE 1 % EX CREA: 1.0000 "application " | TOPICAL_CREAM | Freq: Every day | CUTANEOUS | 3 refills | Status: DC

## 2017-12-10 MED ORDER — SILVER SULFADIAZINE 1 % EX CREA: TOPICAL_CREAM | Freq: Every day | CUTANEOUS | 0 refills | Status: DC

## 2017-12-10 MED ORDER — HYDRALAZINE HCL 50 MG PO TABS
100.0000 mg | ORAL_TABLET | Freq: Three times a day (TID) | ORAL | Status: DC
  Administered 2017-12-10 – 2017-12-12 (×7): 100 mg via ORAL
  Filled 2017-12-10 (×7): qty 2

## 2017-12-10 MED ORDER — INSULIN ASPART PROT & ASPART (70-30 MIX) 100 UNIT/ML ~~LOC~~ SUSP
45.0000 [IU] | Freq: Two times a day (BID) | SUBCUTANEOUS | Status: DC
  Administered 2017-12-10 – 2017-12-12 (×4): 45 [IU] via SUBCUTANEOUS

## 2017-12-10 MED FILL — SSD 1% CREAM: 1 | 30 days supply | Qty: 400 | Fill #0

## 2017-12-10 NOTE — Progress Notes (Signed)
INFECTIOUS DISEASE PROGRESS NOTE  ID: Luke [name] is a 37 y.o. male with  Principal Problem:   Streptococcal bacteremia Active Problems:   Drug abuse, IV ([place])   Tobacco abuse   Essential hypertension   Abscess   DKA (diabetic ketoacidoses) ([place])   Diabetic polyneuropathy associated with type 1 diabetes mellitus ([name])   GERD (gastroesophageal reflux disease)   Sepsis due to cellulitis ([name])   Hepatitis C   Actinomyces infection   Cellulitis of left foot   Acute hematogenous osteomyelitis, left ankle and foot ([name])  Subjective: 37 yo M with previous IVDA, DM2, LE wounds, bacteremia with actinomyces and viridans strep. He  Was adm on  9-5 and underwent debridement on 9-13. He was treated with IV Penicllin since 9-9, anbx started on 9-5. The plan was for pt to receive 4 weeks of Pen followed by several months of amoxil.  Asking when he can go home.   Abtx:  Anti-infectives (From admission, onward)   Start     Dose/Rate Route Frequency Ordered Stop   12/06/17 1630  sodium chloride 0.9 % 250 mL with penicillin G potassium 4,000,000 Units infusion     250 mL/hr  Intravenous Every 4 hours 12/06/17 1456     11/27/17 0730  ceFAZolin (ANCEF) IVPB 2g/100 mL premix  Status:  Discontinued     2 g 200 mL/hr over 30 Minutes Intravenous To Short Stay 11/27/17 0716 11/27/17 1317   11/25/17 1400  penicillin G potassium 4 Million Units in dextrose 5 % 250 mL IVPB  Status:  Discontinued     4 Million Units 250 mL/hr over 60 Minutes Intravenous Every 4 hours 11/25/17 1254 12/06/17 1600   11/25/17 0800  vancomycin (VANCOCIN) IVPB 1000 mg/200 mL premix  Status:  Discontinued     1,000 mg 200 mL/hr over 60 Minutes Intravenous Every 8 hours 11/25/17 0129 11/25/17 1413   11/24/17 0600  ceFAZolin (ANCEF) IVPB 2g/100 mL premix     2 g 200 mL/hr over 30 Minutes Intravenous On call to O.R. 11/23/17 2306 11/25/17 0559   11/23/17 1400  piperacillin-tazobactam (ZOSYN) IVPB 3.375 g  Status:  Discontinued      3.375 g 12.5 mL/hr over 240 Minutes Intravenous Every 8 hours 11/23/17 1031 11/25/17 1254   11/23/17 0000  metroNIDAZOLE (FLAGYL) IVPB 500 mg  Status:  Discontinued     500 mg 100 mL/hr over 60 Minutes Intravenous Every 8 hours 11/22/17 2312 11/23/17 1014   11/22/17 2315  cefTRIAXone (ROCEPHIN) 2 g in sodium chloride 0.9 % 100 mL IVPB  Status:  Discontinued     2 g 200 mL/hr over 30 Minutes Intravenous Every 24 hours 11/22/17 2312 11/23/17 1014   11/22/17 1400  piperacillin-tazobactam (ZOSYN) IVPB 3.375 g  Status:  Discontinued     3.375 g 12.5 mL/hr over 240 Minutes Intravenous Every 8 hours 11/22/17 1043 11/22/17 2311   11/22/17 1045  vancomycin (VANCOCIN) IVPB 750 mg/150 ml premix  Status:  Discontinued     750 mg 150 mL/hr over 60 Minutes Intravenous Every 12 hours 11/22/17 1043 11/25/17 0129   11/21/17 1830  vancomycin (VANCOCIN) IVPB 750 mg/150 ml premix     750 mg 150 mL/hr over 60 Minutes Intravenous  Once 11/21/17 1816 11/21/17 2030   11/21/17 1715  vancomycin (VANCOCIN) IVPB 1000 mg/200 mL premix     1,000 mg 200 mL/hr over 60 Minutes Intravenous  Once 11/21/17 1710 11/21/17 1914   11/21/17 1715  piperacillin-tazobactam (ZOSYN) IVPB 3.375  g     3.375 g 12.5 mL/hr over 240 Minutes Intravenous  Once 11/21/17 1710 11/21/17 2030      Medications:  Scheduled: . amLODipine  10 mg Oral Daily  . docusate sodium  100 mg Oral BID  . enoxaparin (LOVENOX) injection  40 mg Subcutaneous Daily  . famotidine  20 mg Oral BID  . gabapentin  400 mg Oral TID  . hepatitis b vaccine  1 mL Intramuscular Once  . hydrALAZINE  100 mg Oral Q8H  . insulin aspart  0-15 Units Subcutaneous TID WC  . insulin aspart  0-5 Units Subcutaneous QHS  . insulin aspart  3 Units Subcutaneous TID WC  . insulin aspart protamine- aspart  45 Units Subcutaneous BID WC  . levothyroxine  50 mcg Oral QAC breakfast  . silver sulfADIAZINE   Topical Daily  . traZODone  50 mg Oral QHS    Objective: Vital signs  in last 24 hours: Temp:  [98.2 F (36.8 C)-98.6 F (37 C)] 98.2 F (36.8 C) (09/24 0443) Pulse Rate:  [98-111] 98 (09/24 0443) Resp:  [16-20] 20 (09/24 0443) BP: (134-152)/(71-82) 149/82 (09/24 0836) SpO2:  [97 %-100 %] 98 % (09/24 0443)   General appearance: alert, cooperative and no distress Incision/Wound: LE wrapped.   Lab Results Recent Labs    12/09/17 0925 12/10/17 0400  WBC  --  10.0  HGB  --  9.6*  HCT  --  30.2*  NA  --  136  K  --  4.9  CL  --  106  CO2  --  23  BUN  --  12  CREATININE 1.52* 1.34*   Liver Panel Recent Labs    12/10/17 0400  PROT 5.9*  ALBUMIN 2.6*  AST 23  ALT 25  ALKPHOS 93  BILITOT 0.4   Sedimentation Rate No results for input(s): ESRSEDRATE in the last 72 hours. C-Reactive Protein No results for input(s): CRP in the last 72 hours.  Microbiology: No results found for this or any previous visit (from the past 240 hour(s)).  Studies/Results: No results found.   Assessment/Plan: DM2 Actinomyces, Viridans strep bacteremia CKD IVDA Hepatitis C  Total days of antibiotics: 19 (Pen)  HIV (-) Would continue Pen til 10-5 then change to Amoxil 875 mg po bid.  If he wishes to leave sooner, he can be changed to amoxil at that time.  Please have him f/u in ID clinic in 2-3 weeks.          Bobby Rumpf MD, FACP Infectious Diseases (pager) [phone] [email or web address] 12/10/2017, 11:43 AM  LOS: 19 days

## 2017-12-10 NOTE — Progress Notes (Signed)
Physical Therapy Treatment [name] MRN: [id number] DOB: [birth year] Today's Date: 12/10/2017    History of Present Illness Pt is a 37 y/o male admitted secondary to sepsis and abscesses on BLE. 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 Pt is s/p bilateral foot debridement with wound vac placement. Pt is also s/p TEE on 9/12 which did not show any vegetation. PMH includes IV drug abuse, DM, HTN, and seizures.     PT Comments    Patient seen for activity progression and education. At this time, patient has met all PT goals and shows no specific needs for continued skilled services at this time. Educated patient on HEP activities for gentle ROM and activity restrictions during recovery for ankle/foot stability upon discharge. Patient receptive and appreciative. No further acute needs, will sign off.   Follow Up Recommendations  No PT follow up     Equipment Recommendations  None recommended by PT    Recommendations for Other Services       Precautions / Restrictions Precautions Precautions: Fall Required Braces or Orthoses: Other Brace/Splint Other Brace/Splint: pt has post op shoes Restrictions Weight Bearing Restrictions: No RLE Weight Bearing: Weight bearing as tolerated LLE Weight Bearing: Weight bearing as tolerated    Mobility  Bed Mobility Overal bed mobility: Independent                Transfers Overall transfer level: Independent                  Ambulation/Gait Ambulation/Gait assistance: Independent Gait Distance (Feet): 310 Feet Assistive device: None Gait Pattern/deviations: Antalgic     General Gait Details: modest antalgic gait but overall no difficulty with ambulation   Stairs Stairs: Yes Stairs assistance: Independent Stair Management: No rails Number of Stairs: 10 General stair comments: performed flight steps wihtout rail, no difficulty this session   Wheelchair Mobility    Modified Rankin  (Stroke Patients Only)       Balance Overall balance assessment: Needs assistance Sitting-balance support: No upper extremity supported;Feet supported Sitting balance-Leahy Scale: Good     Standing balance support: No upper extremity supported Standing balance-Leahy Scale: Good                              Cognition Arousal/Alertness: Awake/alert Behavior During Therapy: WFL for tasks assessed/performed Overall Cognitive Status: Within Functional Limits for tasks assessed                                        Exercises      General Comments        Pertinent Vitals/Pain Pain Assessment: Faces Pain Score: 6  Pain Location: left foot Pain Descriptors / Indicators: Aching Pain Intervention(s): Monitored during session    Home Living                      Prior Function            PT Goals (current goals can now be found in the care plan section) Acute Rehab PT Goals Patient Stated Goal: to go home before 8 weeks is up  PT Goal Formulation: With patient Time For Goal Achievement: 12/13/17 Potential to Achieve Goals: Good Progress towards PT goals: Goals met/education completed, patient discharged from PT    Frequency  PT Plan Discharge plan needs to be updated(goals met, education complete)    Co-evaluation              AM-PAC PT "6 Clicks" Daily Activity  Outcome Measure  Difficulty turning over in bed (including adjusting bedclothes, sheets and blankets)?: None Difficulty moving from lying on back to sitting on the side of the bed? : None Difficulty sitting down on and standing up from a chair with arms (e.g., wheelchair, bedside commode, etc,.)?: None Help needed moving to and from a bed to chair (including a wheelchair)?: None Help needed walking in hospital room?: None Help needed climbing 3-5 steps with a railing? : None 6 Click Score: 24    End of Session   Activity Tolerance: Patient  tolerated treatment well Patient left: with call bell/phone within reach;in bed Nurse Communication: Mobility status       Time: 0962-8366 PT Time Calculation (min) (ACUTE ONLY): 15 min  Charges:  $Gait Training: 8-22 mins                     Alben Deeds, PT DPT  Board Certified Neurologic Specialist [place] Pager [phone] Office [place] 12/10/2017, 4:26 PM

## 2017-12-10 NOTE — Progress Notes (Signed)
PROGRESS NOTE        [name] Age: 37 y.o. Sex: male Date of Birth: [birth year] Admit Date: 11/21/2017 Admitting Physician Patrecia Pour, MD LKG:[id number], [name], NP  Brief Narrative:   Patient is a 37 y.o. male with history of IVDA (relapse recently) insulin-dependent diabetes, hypertension who presented with sepsis secondary to soft tissue infection with abscesses of bilateral feet, osteomyelitis of the medial malleolus and actinomyces bacteremia.  Patient underwent debridement of bilateral feet, debridement of the left ankle and application of wound VAC on 9/13.  See below for further details.  Subjective:  Patient in bed, appears comfortable, denies any headache, no fever, no chest pain or pressure, no shortness of breath , no abdominal pain. No focal weakness.  Again appears to be in no distress whatsoever, laying comfortably watching television, again demands IV Dilaudid and he was politely refused.   Assessment/Plan:  Sepsis secondary to bilateral lower extremity soft tissue infection with abscesses and osteomyelitis of the left medial malleolus along with actinomyces bacteremia: Orthopedics and ID following, currently on IV penicillin G, sepsis physiology has resolved, he is status post debridement by orthopedics on 11/29/2017, and wound VAC in both feet, orthopedics cleared for wound VAC was removed on 12/07/2017.  TTE and TEE done this admission are negative for any endocarditis.  He is supposed to get a minimum of 4 weeks of IV antibiotics total with start date of 11/21/2017 thereafter possibly prolonged oral Augmentin or amoxicillin for a prolonged.  Of time per ID. Has a PICC line in place.  Due to his history of IV drug use he will be kept in the hospital for monitoring as no safe placement can be arranged.   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  Sensitively counseled to quit, at this point his wounds are chronic  and at least 74 weeks old, wound VAC has been removed, minimal to no surrounding cellulitis, at this point if he had no IV drug use history he would have been discharged home with IV antibiotics.  He is constantly asking for IV Dilaudid, requested staff to flush it fast several times, clinically his wounds appear well-healed, at this point there is no justification for IV narcotics, I requested orthopedic surgeon Dr. Sharol Given to come and evaluate the wounds and give his opinion.  He evaluated the wound on 12/10/2017 morning and told that at this time that healing extremely well and patient should not be in this much pain at all.  I then inform the patient that his IV narcotics will be discontinued, oral narcotics will be continued at present dose.  I have examined him several times and he absolutely is in no distress whatsoever, I interviewed multiple staff members who have given the same history.   DKA in a patient with DM type II: Poor control outpatient with A1c of 12.1, DKA has resolved, his sugars are quite labile and erratic, I have adjusted his 7030 dose, added pre-meal NovoLog, continue moderate dose sliding scale, monitor and adjust as needed. Counseled on dietary compliance.  CBG (last 3)  Recent Labs    12/09/17 2205 12/09/17 2240 12/10/17 0802  GLUCAP 48* 74 360*    CKD 3: Appears his baseline creatinine is anywhere between 1.3-1.4, monitor closely.  For now holding ACE inhibitor.  ARF has resolved.  Hypertension: BP well controlled on Norvasc, increase as scheduled hydralazine for better control, PRN hydralazine on board, holding ACE inhibitor due to mild ARF that he had developed.  Hypothyroidism: Continue levothyroxine  Peripheral neuropathy: Likely related to diabetes, continue Neurontin  Chronic hepatitis C: We will need outpatient follow-up at the ID clinic.  Polysubstance abuse: Unfortunately recently relapsed and started using IV heroin-injected in his bilateral lower  extremities.  Claims he was clean for 2 years before his most recent relapse.  He has a history of numerous soft tissue infections in his bilateral upper extremity-and apparently has a history of fasciotomy in his left upper extremity.  Has been counseled extensively.    DVT Prophylaxis: Prophylactic Lovenox  Code Status: Full code   Family Communication:  Girlfriend/wife at bedside on 12/07/2017 & 12/08/17  Disposition Plan:  Remain inpatient-given history of IV drug use-not a candidate for outpatient IV antimicrobial therapy, if he cannot be placed into SNF-will require several weeks of inpatient hospitalization.  Antimicrobial agents: Anti-infectives (From admission, onward)   Start     Dose/Rate Route Frequency Ordered Stop   12/06/17 1630  sodium chloride 0.9 % 250 mL with penicillin G potassium 4,000,000 Units infusion     250 mL/hr  Intravenous Every 4 hours 12/06/17 1456     11/27/17 0730  ceFAZolin (ANCEF) IVPB 2g/100 mL premix  Status:  Discontinued     2 g 200 mL/hr over 30 Minutes Intravenous To Short Stay 11/27/17 0716 11/27/17 1317   11/25/17 1400  penicillin G potassium 4 Million Units in dextrose 5 % 250 mL IVPB  Status:  Discontinued     4 Million Units 250 mL/hr over 60 Minutes Intravenous Every 4 hours 11/25/17 1254 12/06/17 1600   11/25/17 0800  vancomycin (VANCOCIN) IVPB 1000 mg/200 mL premix  Status:  Discontinued     1,000 mg 200 mL/hr over 60 Minutes Intravenous Every 8 hours 11/25/17 0129 11/25/17 1413   11/24/17 0600  ceFAZolin (ANCEF) IVPB 2g/100 mL premix     2 g 200 mL/hr over 30 Minutes Intravenous On call to O.R. 11/23/17 2306 11/25/17 0559   11/23/17 1400  piperacillin-tazobactam (ZOSYN) IVPB 3.375 g  Status:  Discontinued     3.375 g 12.5 mL/hr over 240 Minutes Intravenous Every 8 hours 11/23/17 1031 11/25/17 1254   11/23/17 0000  metroNIDAZOLE (FLAGYL) IVPB 500 mg  Status:  Discontinued     500 mg 100 mL/hr over 60 Minutes Intravenous Every 8  hours 11/22/17 2312 11/23/17 1014   11/22/17 2315  cefTRIAXone (ROCEPHIN) 2 g in sodium chloride 0.9 % 100 mL IVPB  Status:  Discontinued     2 g 200 mL/hr over 30 Minutes Intravenous Every 24 hours 11/22/17 2312 11/23/17 1014   11/22/17 1400  piperacillin-tazobactam (ZOSYN) IVPB 3.375 g  Status:  Discontinued     3.375 g 12.5 mL/hr over 240 Minutes Intravenous Every 8 hours 11/22/17 1043 11/22/17 2311   11/22/17 1045  vancomycin (VANCOCIN) IVPB 750 mg/150 ml premix  Status:  Discontinued     750 mg 150 mL/hr over 60 Minutes Intravenous Every 12 hours 11/22/17 1043 11/25/17 0129   11/21/17 1830  vancomycin (VANCOCIN) IVPB 750 mg/150 ml premix     750 mg 150 mL/hr over 60 Minutes Intravenous  Once 11/21/17 1816 11/21/17 2030   11/21/17 1715  vancomycin (VANCOCIN) IVPB 1000 mg/200 mL premix     1,000 mg 200 mL/hr over 60 Minutes Intravenous  Once 11/21/17 1710 11/21/17 1914  11/21/17 1715  piperacillin-tazobactam (ZOSYN) IVPB 3.375 g     3.375 g 12.5 mL/hr over 240 Minutes Intravenous  Once 11/21/17 1710 11/21/17 2030      Procedures:  9/13>> debridement bilateral feet, debridement left ankle and application of wound VAC (Dr. Sharol Given)  9/12>> TEE  - No vegetations are seen. There is 1-2+ central aortic insufficiency. Mild LVH. Chiari network (normal variant) is present.  9/9>>TTE:  - Left ventricle: There was mild concentric hypertrophy. Systolic function was normal. The estimated ejection fraction was in the range of 60% to 65%. Left ventricular diastolic function parameters were normal. - Aortic valve: Transvalvular velocity was within the normal range. There was no stenosis. There was mild regurgitation. Valve area (VTI): 2.83 cm^2. Valve area (Vmax): 2.61 cm^2. Valve area (Vmean): 2.72 cm^2. Regurgitation pressure half-time: 944 ms. - Left atrium: The atrium was mildly dilated. - Atrial septum: No defect or patent foramen ovale was identified  by color flow Doppler. - Tricuspid valve:  There was mild regurgitation. - Pulmonic valve: There was moderate regurgitation. - Pulmonary arteries: Systolic pressure was within the normal  range. PA peak pressure: 30 mm Hg (S). - Pericardium, extracardiac: A trivial pericardial effusion was  identified.  CONSULTS:  ID and orthopedic surgery  Time spent: 25- minutes-Greater than 50% of this time was spent in counseling, explanation of diagnosis, planning of further management, and coordination of care.  MEDICATIONS: Scheduled Meds: . amLODipine  10 mg Oral Daily  . docusate sodium  100 mg Oral BID  . enoxaparin (LOVENOX) injection  40 mg Subcutaneous Daily  . famotidine  20 mg Oral BID  . gabapentin  400 mg Oral TID  . hepatitis b vaccine  1 mL Intramuscular Once  . hydrALAZINE  50 mg Oral Q8H  . insulin aspart  0-15 Units Subcutaneous TID WC  . insulin aspart  0-5 Units Subcutaneous QHS  . insulin aspart  3 Units Subcutaneous TID WC  . insulin aspart protamine- aspart  40 Units Subcutaneous BID WC  . levothyroxine  50 mcg Oral QAC breakfast  . silver sulfADIAZINE   Topical Daily  . traZODone  50 mg Oral QHS   Continuous Infusions: . sodium chloride 0.9 % 250 mL with penicillin G potassium 4,000,000 Units infusion 250 mL/hr at 12/10/17 0848   PRN Meds:.acetaminophen, bisacodyl, dextrose, hydrALAZINE, magnesium citrate, [DISCONTINUED] ondansetron **OR** ondansetron (ZOFRAN) IV, oxyCODONE, polyethylene glycol, sodium chloride flush, traMADol   PHYSICAL EXAM: Vital signs: Vitals:   12/09/17 1303 12/09/17 2058 12/10/17 0443 12/10/17 0836  BP: 134/79 (!) 148/71 (!) 152/81 (!) 149/82  Pulse: (!) 111 (!) 102 98   Resp: 16 18 20    Temp: 98.6 F (37 C) 98.4 F (36.9 C) 98.2 F (36.8 C)   TempSrc: Oral Oral Oral   SpO2: 100% 97% 98%   Weight:      Height:       Filed Weights   11/23/17 0227 11/28/17 0729  Weight: 86.8 kg 86.8 kg   Body mass index is 28.26 kg/m.   Exam  Awake Alert, Oriented X 3, No new F.N  deficits, in no distress [place].AT,PERRAL Supple Neck,No JVD, No cervical lymphadenopathy appriciated.  Symmetrical Chest wall movement, Good air movement bilaterally, CTAB RRR,No Gallops, Rubs or new Murmurs, No Parasternal Heave +ve B.Sounds, Abd Soft, No tenderness, No organomegaly appriciated, No rebound - guarding or rigidity. No Cyanosis, Clubbing or edema,    I have personally reviewed following labs and imaging studies  LABORATORY DATA: CBC: Recent  Labs  Lab 12/04/17 0421 12/10/17 0400  WBC 13.6* 10.0  HGB 9.9* 9.6*  HCT 31.5* 30.2*  MCV 86.1 87.3  PLT 573* 428*    Basic Metabolic Panel: Recent Labs  Lab 12/04/17 0421 12/05/17 0352 12/06/17 0335 12/07/17 0646 12/08/17 0310 12/09/17 0925 12/10/17 0400  NA 141 138  --   --   --   --  136  K 4.0 4.0  --   --   --   --  4.9  CL 105 104  --   --   --   --  106  CO2 24 24  --   --   --   --  23  GLUCOSE 112* 96  --   --   --   --  266*  BUN 15 20  --   --   --   --  12  CREATININE 1.45* 1.38* 1.35* 1.58* 1.66* 1.52* 1.34*  CALCIUM 9.2 8.9  --   --   --   --  9.0  MG  --   --   --   --   --   --  1.8    GFR: Estimated Creatinine Clearance: 82.3 mL/min (A) (by C-G formula based on SCr of 1.34 mg/dL (H)).  Liver Function Tests: Recent Labs  Lab 12/04/17 0421 12/10/17 0400  AST 22 23  ALT 23 25  ALKPHOS 90 93  BILITOT 0.5 0.4  PROT 6.1* 5.9*  ALBUMIN 2.6* 2.6*   No results for input(s): LIPASE, AMYLASE in the last 168 hours. No results for input(s): AMMONIA in the last 168 hours.  Coagulation Profile: No results for input(s): INR, PROTIME in the last 168 hours.  Cardiac Enzymes: No results for input(s): CKTOTAL, CKMB, CKMBINDEX, TROPONINI in the last 168 hours.  BNP (last 3 results) No results for input(s): PROBNP in the last 8760 hours.  HbA1C: No results for input(s): HGBA1C in the last 72 hours.  CBG: Recent Labs  Lab 12/09/17 1220 12/09/17 1638 12/09/17 2205 12/09/17 2240 12/10/17 0802    GLUCAP 71 256* 48* 74 360*    Lipid Profile: No results for input(s): CHOL, HDL, LDLCALC, TRIG, CHOLHDL, LDLDIRECT in the last 72 hours.  Thyroid Function Tests: No results for input(s): TSH, T4TOTAL, FREET4, T3FREE, THYROIDAB in the last 72 hours.  Anemia Panel: No results for input(s): VITAMINB12, FOLATE, FERRITIN, TIBC, IRON, RETICCTPCT in the last 72 hours.  Urine analysis:    Component Value Date/Time   COLORURINE STRAW (A) 11/21/2017 1320   APPEARANCEUR CLEAR 11/21/2017 1320   APPEARANCEUR Clear 04/11/2014 0138   LABSPEC 1.027 11/21/2017 1320   LABSPEC 1.026 04/11/2014 0138   PHURINE 6.0 11/21/2017 1320   GLUCOSEU >=500 (A) 11/21/2017 1320   GLUCOSEU >=500 04/11/2014 0138   HGBUR MODERATE (A) 11/21/2017 1320   BILIRUBINUR NEGATIVE 11/21/2017 1320   BILIRUBINUR neg 02/18/2017 0946   BILIRUBINUR Negative 04/11/2014 0138   KETONESUR 20 (A) 11/21/2017 1320   PROTEINUR 30 (A) 11/21/2017 1320   UROBILINOGEN 0.2 02/18/2017 0946   UROBILINOGEN 0.2 12/10/2013 1221   NITRITE NEGATIVE 11/21/2017 1320   LEUKOCYTESUR NEGATIVE 11/21/2017 1320   LEUKOCYTESUR Negative 04/11/2014 0138    Sepsis Labs: Lactic Acid, Venous    Component Value Date/Time   LATICACIDVEN 1.77 11/21/2017 1957    MICROBIOLOGY: No results found for this or any previous visit (from the past 240 hour(s)).  RADIOLOGY STUDIES/RESULTS: Mr Foot Left W Wo Contrast  Result Date: 11/22/2017 CLINICAL DATA:  IV  drug abuser who injects in his feet. Bilateral foot wounds for 1 week. Chills and malaise. EXAM: MRI OF THE LEFT FOREFOOT WITHOUT AND WITH CONTRAST TECHNIQUE: Multiplanar, multisequence MR imaging of the left forefoot was performed both before and after administration of intravenous contrast. CONTRAST:  8.5 cc Gadavist IV COMPARISON:  Plain films left foot 11/21/2017. FINDINGS: Bones/Joint/Cartilage There is partial visualization of marrow edema in the medial malleolus with postcontrast enhancement. Bone  marrow signal is otherwise normal. Ligaments Intact Muscles and Tendons No intramuscular fluid collection is identified. Soft tissues Diffuse subcutaneous edema enhancement are seen about the foot. There is an abscess in the dorsal subcutaneous tissues centered over the bases of the first through third metatarsals. The abscess measures 1.8 cm craniocaudal by 4.6 cm transverse by 4.4 cm long. A second abscess in the subcutaneous tissues measuring 2.5 cm long by 2.3 cm craniocaudal by 0.9 cm transverse is identified along the proximal first metatarsal. This abscess is centered 1.5 cm distal to the first tarsometatarsal joint. A third abscess which is incompletely visualized is seen in subcutaneous tissues 3.2 cm superior to the tip of the medial malleolus. This collection measures approximately 1.5 cm craniocaudal by 0.2 cm transverse. IMPRESSION: Three subcutaneous abscesses are identified as described above. The largest collection is in the dorsal soft tissues at the level of the proximal first and third metatarsals. Edema and enhancement the medial malleolus consistent with osteomyelitis. Electronically Signed   By: Inge Rise M.D.   On: 11/22/2017 13:01   Dg Foot Complete Left  Result Date: 11/21/2017 CLINICAL DATA:  Bilateral foot pain for 4-5 days, left-greater-than-right, swelling and open sores, the patient is diabetic, and has a smoking history EXAM: LEFT FOOT - COMPLETE 3+ VIEW COMPARISON:  None. FINDINGS: There is soft tissue swelling over the dorsum of the left foot. However no underlying focal demineralization or erosion is seen to indicate osteomyelitis. No acute abnormality is noted. Joint spaces appear normal. No erosion is seen. IMPRESSION: Soft tissue swelling over the dorsum of the foot. No underlying bony abnormality. Electronically Signed   By: Ivar Drape M.D.   On: 11/21/2017 15:30   Dg Foot Complete Right  Result Date: 11/21/2017 CLINICAL DATA:  Bilateral foot pain for 4-5 days,  left-greater-than-right with swelling and redness, the patient is diabetic and does have a smoking history EXAM: RIGHT FOOT COMPLETE - 3+ VIEW COMPARISON:  None FINDINGS: There is mild soft tissue swelling over the dorsum of the right foot. No underlying bony demineralization or erosion is seen to indicate active osteomyelitis. Alignment is normal and joint spaces appear normal. IMPRESSION: Soft tissue swelling over the dorsum. No evidence of active osteomyelitis. Electronically Signed   By: Ivar Drape M.D.   On: 11/21/2017 15:32   [place] Ekg Site Rite  Result Date: 11/22/2017 If Site Rite image not attached, placement could not be confirmed due to current cardiac rhythm.    LOS: 37 days   Lala Lund, MD  [facility]  If 7PM-7AM, please contact night-coverage  Please page via [email or web address] [email or web address]-click on MD name and type text message  12/10/2017, 9:30 AM

## 2017-12-10 NOTE — Progress Notes (Addendum)
Inpatient Diabetes Program Recommendations  AACE/ADA: New Consensus Statement on Inpatient Glycemic Control (2015)  Target Ranges:  Prepandial:   less than 140 mg/dL      Peak postprandial:   less than 180 mg/dL (1-2 hours)      Critically ill patients:  140 - 180 mg/dL   Lab Results  Component Value Date   GLUCAP 276 (H) 12/10/2017   HGBA1C 12.1 (H) 11/22/2017    Review of Glycemic ControlResults for MONROE, QIN (MRN [id number]) as of 12/10/2017 12:34  Ref. Range 12/09/2017 16:38 12/09/2017 22:05 12/09/2017 22:40 12/10/2017 08:02 12/10/2017 11:59  Glucose-Capillary Latest Ref Range: 70 - 99 mg/dL 256 (H) 48 (L) 74 360 (H) 276 (H)   Diabetes history:DM 2 Outpatient Diabetes medications: 70/30 40 units qam, 30 units qpm, Regular insulin SSI tid Current orders for Inpatient glycemic control: Novolog 70/30 mix 45 units bid, Novolog moderate tid with meals and HS, Novolog 3 units tid with meals  Inpatient Diabetes Program Recommendations:   Blood sugars labile on 12/09/17.  -Please consider reducing Novolog correction to sensitive tid with meals.   -Also since Novolog 70/30 has meal coverage included, consider d/c of Novolog meal coverage 3 units tid with meals.   Text page sent to MD.  Thanks,  Adah Perl, RN, BC-ADM Inpatient Diabetes Coordinator Pager [phone] (8a-5p)

## 2017-12-10 NOTE — Progress Notes (Signed)
Patient ID: Luke [name], male   DOB: 28-Dec-1980, 37 y.o.   MRN: [id number] Patient has shown excellent resolution of the abscesses on both feet.  The medial malleolar ulcer on the left ankle is well-healed there is no redness no cellulitis no drainage.  The large abscess that was on the dorsum of the right foot the incision is well-healed there is no redness no cellulitis no signs of infection there is minimal swelling in both feet.  Patient's 2 incisions on the dorsum of the left foot also shows stable healing there is good granulation tissue on the dorsum of the foot and patient will need to continue Silvadene dressing changes to the dorsal wound left foot.  Discussed the importance of elevation.  Discussed that he should have minimal pain at this time if he continues with elevation and moving the ankles to use the calf muscle to help pump out swelling from his lower extremities.  I will follow-up in the office 1 week after discharge.  Orders written today to harvest sutures.

## 2017-12-11 LAB — GLUCOSE, CAPILLARY
GLUCOSE-CAPILLARY: 128 mg/dL — AB (ref 70–99)
GLUCOSE-CAPILLARY: 217 mg/dL — AB (ref 70–99)
GLUCOSE-CAPILLARY: 53 mg/dL — AB (ref 70–99)
Glucose-Capillary: 80 mg/dL (ref 70–99)
Glucose-Capillary: 131 mg/dL — ABNORMAL HIGH (ref 70–99)

## 2017-12-11 LAB — CREATININE, SERUM
Creatinine, Ser: 1.53 mg/dL — ABNORMAL HIGH (ref 0.61–1.24)
GFR calc Af Amer: >60 mL/min (ref >60)
GFR calc non Af Amer: 57 mL/min — ABNORMAL LOW (ref >60)

## 2017-12-11 NOTE — Progress Notes (Addendum)
Inpatient Diabetes Program Recommendations  AACE/ADA: New Consensus Statement on Inpatient Glycemic Control (2015)  Target Ranges:  Prepandial:   less than 140 mg/dL      Peak postprandial:   less than 180 mg/dL (1-2 hours)      Critically ill patients:  140 - 180 mg/dL   Lab Results  Component Value Date   GLUCAP 80 12/11/2017   HGBA1C 12.1 (H) 11/22/2017    Review of Glycemic ControlResults for BOHDEN, DUNG (MRN [id number]) as of 12/11/2017 13:40  Ref. Range 12/10/2017 11:59 12/10/2017 16:49 12/10/2017 22:34 12/11/2017 07:33 12/11/2017 12:48  Glucose-Capillary Latest Ref Range: 70 - 99 mg/dL 276 (H) 111 (H) 102 (H) 217 (H) 80   Diabetes history:DM 2 Outpatient Diabetes medications: 70/30 40 units qam, 30 units qpm, Regular insulin SSI tid Current orders for Inpatient glycemic control: Novolog 70/30 mix 45 units bid, Novolog moderate tid with meals and HS, Novolog 3 units tid with meals Inpatient Diabetes Program Recommendations:   Please d/c Novolog 3 units tid with meals due to patient being on 70/30 insulin. Text page sent.   Thanks, Adah Perl, RN, BC-ADM Inpatient Diabetes Coordinator Pager [phone] (8a-5p)

## 2017-12-11 NOTE — Progress Notes (Signed)
PROGRESS NOTE        [name] Age: 37 y.o. Sex: male Date of Birth: 1981/01/27 Admit Date: 11/21/2017 Admitting Physician Patrecia Pour, MD JJH:ERDEYCX, [name], NP  Brief Narrative: Patient is a 37 y.o. male with history of IVDA (relapse recently) insulin-dependent diabetes, hypertension who presented with sepsis secondary to soft tissue infection with abscesses of bilateral feet, osteomyelitis of the medial malleolus and actinomyces bacteremia.  Patient underwent debridement of bilateral feet, debridement of the left ankle and application of wound VAC on 9/13.  See below for further details.  Subjective: Claims his father-in-law's best friend died and he probably needs to leave the hospital by Saturday.  No other complaints-pain is well controlled in his lower extremities.  Assessment/Plan: Sepsis secondary to bilateral lower extremity soft tissue infection with abscesses and osteomyelitis of the left medial malleolus along with actinomyces bacteremia: Sepsis pathophysiology has resolved-continue IV penicillin G.  Underwent debridement by orthopedics on 9/13.  TEE on 9/12 did not show any vegetation.  ID recommending IV penicillin G until 10/5, then changed to amoxicillin 875 mg p.o. twice daily.Unfortunately with his history of IV drug use, he is not a candidate for outpatient IV antimicrobial therapy.  Patient indicating that he may leave the hospital on Friday for a funeral-I have asked that he call his wife tomorrow afternoon so that we can discuss further.  Reevaluated by orthopedics on 9/24, wounds are healing well..  DKA: Resolved with insulin infusion.  AKI on CKD stage III: AKI hemodynamically mediated, creatinine now close to usual baseline.  Follow periodically.  Insulin-dependent diabetes: Overall poorly controlled, A1c 12.1.  CBGs continues to fluctuate-continue with 45 units of NovoLog 70/30 twice daily with meals for another  day-we will reassess tomorrow and adjust accordingly.    Hypertension: BP controlled-continue amlodipine and hydralazine.    Hypothyroidism: Continue levothyroxine  Peripheral neuropathy: Appears stable-continue Neurontin  Chronic hepatitis C: We will need outpatient follow-up at the ID clinic.  Narcotic seeking behavior: Per prior notes-patient exhibiting narcotic seeking behavior and requesting IV Dilaudid.  Has been reevaluated by orthopedics-is pain in his lower extremities seems disproportionate objective findings.  He does have neuropathy.  No longer on IV Dilaudid-slowly minimize oral narcotics as much as possible.  Continue Neurontin.  Polysubstance abuse: Unfortunately recently relapsed and started using IV heroin-injected in his bilateral lower extremities-which likely caused above-noted wound/abscess issues..  Claims he was clean for 2 years before his most recent relapse.  He has a history of numerous soft tissue infections in his bilateral upper extremity-and apparently has a history of fasciotomy in his left upper extremity.  Has been counseled extensively.  DVT Prophylaxis: Prophylactic Lovenox  Code Status: Full code   Family Communication: None at bedside  Disposition Plan: Remain inpatient-ideally needs to stay until 10/5-but indicating that he needs to be quite a week and-we will discuss with his spouse/family tomorrow  Antimicrobial agents: Anti-infectives (From admission, onward)   Start     Dose/Rate Route Frequency Ordered Stop   12/06/17 1630  sodium chloride 0.9 % 250 mL with penicillin G potassium 4,000,000 Units infusion     250 mL/hr  Intravenous Every 4 hours 12/06/17 1456 12/21/17 2359   11/27/17 0730  ceFAZolin (ANCEF) IVPB 2g/100 mL premix  Status:  Discontinued     2 g 200 mL/hr over  30 Minutes Intravenous To Short Stay 11/27/17 0716 11/27/17 1317   11/25/17 1400  penicillin G potassium 4 Million Units in dextrose 5 % 250 mL IVPB  Status:   Discontinued     4 Million Units 250 mL/hr over 60 Minutes Intravenous Every 4 hours 11/25/17 1254 12/06/17 1600   11/25/17 0800  vancomycin (VANCOCIN) IVPB 1000 mg/200 mL premix  Status:  Discontinued     1,000 mg 200 mL/hr over 60 Minutes Intravenous Every 8 hours 11/25/17 0129 11/25/17 1413   11/24/17 0600  ceFAZolin (ANCEF) IVPB 2g/100 mL premix     2 g 200 mL/hr over 30 Minutes Intravenous On call to O.R. 11/23/17 2306 11/25/17 0559   11/23/17 1400  piperacillin-tazobactam (ZOSYN) IVPB 3.375 g  Status:  Discontinued     3.375 g 12.5 mL/hr over 240 Minutes Intravenous Every 8 hours 11/23/17 1031 11/25/17 1254   11/23/17 0000  metroNIDAZOLE (FLAGYL) IVPB 500 mg  Status:  Discontinued     500 mg 100 mL/hr over 60 Minutes Intravenous Every 8 hours 11/22/17 2312 11/23/17 1014   11/22/17 2315  cefTRIAXone (ROCEPHIN) 2 g in sodium chloride 0.9 % 100 mL IVPB  Status:  Discontinued     2 g 200 mL/hr over 30 Minutes Intravenous Every 24 hours 11/22/17 2312 11/23/17 1014   11/22/17 1400  piperacillin-tazobactam (ZOSYN) IVPB 3.375 g  Status:  Discontinued     3.375 g 12.5 mL/hr over 240 Minutes Intravenous Every 8 hours 11/22/17 1043 11/22/17 2311   11/22/17 1045  vancomycin (VANCOCIN) IVPB 750 mg/150 ml premix  Status:  Discontinued     750 mg 150 mL/hr over 60 Minutes Intravenous Every 12 hours 11/22/17 1043 11/25/17 0129   11/21/17 1830  vancomycin (VANCOCIN) IVPB 750 mg/150 ml premix     750 mg 150 mL/hr over 60 Minutes Intravenous  Once 11/21/17 1816 11/21/17 2030   11/21/17 1715  vancomycin (VANCOCIN) IVPB 1000 mg/200 mL premix     1,000 mg 200 mL/hr over 60 Minutes Intravenous  Once 11/21/17 1710 11/21/17 1914   11/21/17 1715  piperacillin-tazobactam (ZOSYN) IVPB 3.375 g     3.375 g 12.5 mL/hr over 240 Minutes Intravenous  Once 11/21/17 1710 11/21/17 2030      Procedures: 9/13>> debridement bilateral feet, debridement left ankle and application of wound VAC (Dr. Sharol Given)  9/12>>  TEE No vegetations are seen. There is 1-2+ central aortic insufficiency.  Mild LVH. Chiari network (normal variant) is present.  9/9>>TTE: - Left ventricle: There was mild concentric hypertrophy. Systolic   function was normal. The estimated ejection fraction was in the   range of 60% to 65%. Left ventricular diastolic function   parameters were normal. - Aortic valve: Transvalvular velocity was within the normal range.   There was no stenosis. There was mild regurgitation. Valve area   (VTI): 2.83 cm^2. Valve area (Vmax): 2.61 cm^2. Valve area   (Vmean): 2.72 cm^2. Regurgitation pressure half-time: 944 ms. - Left atrium: The atrium was mildly dilated. - Atrial septum: No defect or patent foramen ovale was identified   by color flow Doppler. - Tricuspid valve: There was mild regurgitation. - Pulmonic valve: There was moderate regurgitation. - Pulmonary arteries: Systolic pressure was within the normal   range. PA peak pressure: 30 mm Hg (S). - Pericardium, extracardiac: A trivial pericardial effusion was   identified.  CONSULTS:  ID and orthopedic surgery  Time spent: 25- minutes-Greater than 50% of this time was spent in counseling, explanation of diagnosis,  planning of further management, and coordination of care.  MEDICATIONS: Scheduled Meds: . amLODipine  10 mg Oral Daily  . docusate sodium  100 mg Oral BID  . enoxaparin (LOVENOX) injection  40 mg Subcutaneous Daily  . famotidine  20 mg Oral BID  . gabapentin  400 mg Oral TID  . hepatitis b vaccine  1 mL Intramuscular Once  . hydrALAZINE  100 mg Oral Q8H  . insulin aspart  0-15 Units Subcutaneous TID WC  . insulin aspart  0-5 Units Subcutaneous QHS  . insulin aspart  3 Units Subcutaneous TID WC  . insulin aspart protamine- aspart  45 Units Subcutaneous BID WC  . levothyroxine  50 mcg Oral QAC breakfast  . silver sulfADIAZINE   Topical Daily  . traZODone  50 mg Oral QHS   Continuous Infusions: . sodium chloride 0.9  % 250 mL with penicillin G potassium 4,000,000 Units infusion 250 mL/hr at 12/11/17 0828   PRN Meds:.acetaminophen, bisacodyl, dextrose, hydrALAZINE, magnesium citrate, [DISCONTINUED] ondansetron **OR** ondansetron (ZOFRAN) IV, oxyCODONE, polyethylene glycol, sodium chloride flush, traMADol   PHYSICAL EXAM: Vital signs: Vitals:   12/10/17 1454 12/10/17 2233 12/11/17 0548 12/11/17 0825  BP: 116/61 123/69 123/72 129/71  Pulse: (!) 108 (!) 109 96 (!) 104  Resp: 18 16 16    Temp: 98.4 F (36.9 C) 98.7 F (37.1 C) 98.4 F (36.9 C)   TempSrc: Oral Oral Oral   SpO2: 96% 99% 100% 98%  Weight:      Height:       Filed Weights   11/23/17 0227 11/28/17 0729  Weight: 86.8 kg 86.8 kg   Body mass index is 28.26 kg/m.   General appearance:Awake, alert, not in any distress.  Eyes:no scleral icterus. HEENT: Atraumatic and Normocephalic Neck: supple, no JVD. Resp:Good air entry bilaterally,no rales or rhonchi CVS: S1 S2 regular, no murmurs.  GI: Bowel sounds present, Non tender and not distended with no gaurding, rigidity or rebound. Extremities: B/L Lower Ext shows no edema, both legs are warm to touch Neurology:  Non focal Psychiatric: Normal judgment and insight. Normal mood. Musculoskeletal:No digital cyanosis Skin:No Rash, warm and dry   I have personally reviewed following labs and imaging studies  LABORATORY DATA: CBC: Recent Labs  Lab 12/10/17 0400  WBC 10.0  HGB 9.6*  HCT 30.2*  MCV 87.3  PLT 428*    Basic Metabolic Panel: Recent Labs  Lab 12/05/17 0352  12/07/17 0646 12/08/17 0310 12/09/17 0925 12/10/17 0400 12/11/17 0402  NA 138  --   --   --   --  136  --   K 4.0  --   --   --   --  4.9  --   CL 104  --   --   --   --  106  --   CO2 24  --   --   --   --  23  --   GLUCOSE 96  --   --   --   --  266*  --   BUN 20  --   --   --   --  12  --   CREATININE 1.38*   < > 1.58* 1.66* 1.52* 1.34* 1.53*  CALCIUM 8.9  --   --   --   --  9.0  --   MG  --   --   --    --   --  1.8  --    < > = values in  this interval not displayed.    GFR: Estimated Creatinine Clearance: 72.1 mL/min (A) (by C-G formula based on SCr of 1.53 mg/dL (H)).  Liver Function Tests: Recent Labs  Lab 12/10/17 0400  AST 23  ALT 25  ALKPHOS 93  BILITOT 0.4  PROT 5.9*  ALBUMIN 2.6*   No results for input(s): LIPASE, AMYLASE in the last 168 hours. No results for input(s): AMMONIA in the last 168 hours.  Coagulation Profile: No results for input(s): INR, PROTIME in the last 168 hours.  Cardiac Enzymes: No results for input(s): CKTOTAL, CKMB, CKMBINDEX, TROPONINI in the last 168 hours.  BNP (last 3 results) No results for input(s): PROBNP in the last 8760 hours.  HbA1C: No results for input(s): HGBA1C in the last 72 hours.  CBG: Recent Labs  Lab 12/10/17 0802 12/10/17 1159 12/10/17 1649 12/10/17 2234 12/11/17 0733  GLUCAP 360* 276* 111* 102* 217*    Lipid Profile: No results for input(s): CHOL, HDL, LDLCALC, TRIG, CHOLHDL, LDLDIRECT in the last 72 hours.  Thyroid Function Tests: No results for input(s): TSH, T4TOTAL, FREET4, T3FREE, THYROIDAB in the last 72 hours.  Anemia Panel: No results for input(s): VITAMINB12, FOLATE, FERRITIN, TIBC, IRON, RETICCTPCT in the last 72 hours.  Urine analysis:    Component Value Date/Time   COLORURINE STRAW (A) 11/21/2017 1320   APPEARANCEUR CLEAR 11/21/2017 1320   APPEARANCEUR Clear 04/11/2014 0138   LABSPEC 1.027 11/21/2017 1320   LABSPEC 1.026 04/11/2014 0138   PHURINE 6.0 11/21/2017 1320   GLUCOSEU >=500 (A) 11/21/2017 1320   GLUCOSEU >=500 04/11/2014 0138   HGBUR MODERATE (A) 11/21/2017 1320   BILIRUBINUR NEGATIVE 11/21/2017 1320   BILIRUBINUR neg 02/18/2017 0946   BILIRUBINUR Negative 04/11/2014 0138   KETONESUR 20 (A) 11/21/2017 1320   PROTEINUR 30 (A) 11/21/2017 1320   UROBILINOGEN 0.2 02/18/2017 0946   UROBILINOGEN 0.2 12/10/2013 1221   NITRITE NEGATIVE 11/21/2017 1320   LEUKOCYTESUR NEGATIVE  11/21/2017 1320   LEUKOCYTESUR Negative 04/11/2014 0138    Sepsis Labs: Lactic Acid, Venous    Component Value Date/Time   LATICACIDVEN 1.77 11/21/2017 1957    MICROBIOLOGY: No results found for this or any previous visit (from the past 240 hour(s)).  RADIOLOGY STUDIES/RESULTS: Mr Foot Left W Wo Contrast  Result Date: 11/22/2017 CLINICAL DATA:  IV drug abuser who injects in his feet. Bilateral foot wounds for 1 week. Chills and malaise. EXAM: MRI OF THE LEFT FOREFOOT WITHOUT AND WITH CONTRAST TECHNIQUE: Multiplanar, multisequence MR imaging of the left forefoot was performed both before and after administration of intravenous contrast. CONTRAST:  8.5 cc Gadavist IV COMPARISON:  Plain films left foot 11/21/2017. FINDINGS: Bones/Joint/Cartilage There is partial visualization of marrow edema in the medial malleolus with postcontrast enhancement. Bone marrow signal is otherwise normal. Ligaments Intact Muscles and Tendons No intramuscular fluid collection is identified. Soft tissues Diffuse subcutaneous edema enhancement are seen about the foot. There is an abscess in the dorsal subcutaneous tissues centered over the bases of the first through third metatarsals. The abscess measures 1.8 cm craniocaudal by 4.6 cm transverse by 4.4 cm long. A second abscess in the subcutaneous tissues measuring 2.5 cm long by 2.3 cm craniocaudal by 0.9 cm transverse is identified along the proximal first metatarsal. This abscess is centered 1.5 cm distal to the first tarsometatarsal joint. A third abscess which is incompletely visualized is seen in subcutaneous tissues 3.2 cm superior to the tip of the medial malleolus. This collection measures approximately 1.5 cm craniocaudal by 0.2 cm  transverse. IMPRESSION: Three subcutaneous abscesses are identified as described above. The largest collection is in the dorsal soft tissues at the level of the proximal first and third metatarsals. Edema and enhancement the medial  malleolus consistent with osteomyelitis. Electronically Signed   By: Inge Rise M.D.   On: 11/22/2017 13:01   Dg Foot Complete Left  Result Date: 11/21/2017 CLINICAL DATA:  Bilateral foot pain for 4-5 days, left-greater-than-right, swelling and open sores, the patient is diabetic, and has a smoking history EXAM: LEFT FOOT - COMPLETE 3+ VIEW COMPARISON:  None. FINDINGS: There is soft tissue swelling over the dorsum of the left foot. However no underlying focal demineralization or erosion is seen to indicate osteomyelitis. No acute abnormality is noted. Joint spaces appear normal. No erosion is seen. IMPRESSION: Soft tissue swelling over the dorsum of the foot. No underlying bony abnormality. Electronically Signed   By: Ivar Drape M.D.   On: 11/21/2017 15:30   Dg Foot Complete Right  Result Date: 11/21/2017 CLINICAL DATA:  Bilateral foot pain for 4-5 days, left-greater-than-right with swelling and redness, the patient is diabetic and does have a smoking history EXAM: RIGHT FOOT COMPLETE - 3+ VIEW COMPARISON:  None FINDINGS: There is mild soft tissue swelling over the dorsum of the right foot. No underlying bony demineralization or erosion is seen to indicate active osteomyelitis. Alignment is normal and joint spaces appear normal. IMPRESSION: Soft tissue swelling over the dorsum. No evidence of active osteomyelitis. Electronically Signed   By: Ivar Drape M.D.   On: 11/21/2017 15:32   [place] Ekg Site Rite  Result Date: 11/22/2017 If Site Rite image not attached, placement could not be confirmed due to current cardiac rhythm.    LOS: 20 days   Oren Binet, MD  [facility]  If 7PM-7AM, please contact night-coverage  Please page via [email or web address]-Password [email or web address]-click on MD name and type text message  12/11/2017, 11:35 AM

## 2017-12-12 LAB — CREATININE, SERUM: CREATININE: 1.42 mg/dL — AB (ref 0.61–1.24)

## 2017-12-12 LAB — GLUCOSE, CAPILLARY
Glucose-Capillary: 246 mg/dL — ABNORMAL HIGH (ref 70–99)
Glucose-Capillary: 276 mg/dL — ABNORMAL HIGH (ref 70–99)

## 2017-12-12 MED ORDER — SILVER SULFADIAZINE 1 % EX CREA: 1.0000 "application " | TOPICAL_CREAM | Freq: Every day | CUTANEOUS | 0 refills | Status: DC

## 2017-12-12 MED ORDER — GABAPENTIN 400 MG PO CAPS: 400.0000 mg | ORAL_CAPSULE | Freq: Three times a day (TID) | ORAL | 0 refills | Status: DC

## 2017-12-12 MED ORDER — LEVOTHYROXINE SODIUM 50 MCG PO TABS: 50.0000 ug | ORAL_TABLET | Freq: Every day | ORAL | 0 refills | Status: DC

## 2017-12-12 MED ORDER — RANITIDINE HCL 150 MG PO TABS: 150.0000 mg | ORAL_TABLET | Freq: Two times a day (BID) | ORAL | 0 refills | Status: DC

## 2017-12-12 MED ORDER — TRAZODONE HCL 50 MG PO TABS: 50.0000 mg | ORAL_TABLET | Freq: Every day | ORAL | 0 refills | Status: DC

## 2017-12-12 MED ORDER — LISINOPRIL 10 MG PO TABS: 10.0000 mg | ORAL_TABLET | Freq: Every day | ORAL | 0 refills | Status: DC

## 2017-12-12 MED ORDER — INSULIN ASPART PROT & ASPART (70-30 MIX) 100 UNIT/ML ~~LOC~~ SUSP: 45.0000 [IU] | Freq: Two times a day (BID) | SUBCUTANEOUS | 0 refills | Status: DC

## 2017-12-12 MED ORDER — AMOXICILLIN 875 MG PO TABS: 875.0000 mg | ORAL_TABLET | Freq: Two times a day (BID) | ORAL | 2 refills | Status: DC

## 2017-12-12 NOTE — Progress Notes (Signed)
Patient blood sugar was 53 when initially checked. Given snack and rechecked. Blood sugar was 128.

## 2017-12-12 NOTE — Progress Notes (Signed)
Spouse at bedside-Long discussion with the patient--RN present as well.  He does not wish to remain hospitalized anymore-he is completely alert and oriented x3.  He is aware of the life threatening/life disabling risk of debilitating infection (including limb loss)-but wants to leave-this MD clearly recommended that this patient stay until 10/5 as recommended by ID for IV antimicrobial therapy.  He is being discharged home at his own request.  See discharge summary for details.

## 2017-12-12 NOTE — Discharge Summary (Signed)
[name] Age: 37 y.o. Sex: male Date of Birth: 12/28/1980 MRN: [id number]. Admitting Physician: Patrecia Pour, MD YYT:[id number], [name], NP  Admit Date: 11/21/2017 Discharge date: 12/12/2017  Recommendations for Outpatient Follow-up:  1. Follow up with PCP in 1-2 weeks 2. Being discharged with a 3-monthsupply of amoxicillin-please ensure follow-up at the ID clinic for further continued care. 3. Also needs follow-up with the ID clinic for treatment of hepatitis C 4. Please obtain BMP/CBC in one week 5. Continue to optimize glycemic control  [place]  [place]  [place]  Equipment/Devices: None  Discharge Condition: Stable  CODE STATUS: FULL CODE  Diet recommendation:  Heart Healthy / Carb Modified  Brief Summary: See H&P, Labs, Consult and Test reports for all details in brief, Patient is a 37y.o. male with history of IVDA (relapse recently) insulin-dependent diabetes, hypertension who presented with sepsis secondary to soft tissue infection with abscesses of bilateral feet, osteomyelitis of the medial malleolus and actinomyces bacteremia.  Patient underwent debridement of bilateral feet, debridement of the left ankle and application of wound VAC on 9/13.  See below for further details  Brief Hospital Course: Sepsis secondary to bilateral lower extremity soft tissue infection with abscesses and osteomyelitis of the left medial malleolus along with actinomyces bacteremia: Sepsis pathophysiology has resolved-managed with IV penicillin G.   G.  Underwent debridement by orthopedics on 9/13.  TEE on 9/12 did not show any vegetation.  ID recommending IV penicillin G until 10/5, then change to amoxicillin 875 mg p.o. twice daily.Unfortunately with his history of IV drug use, he is not a candidate for outpatient IV antimicrobial therapy.  Patient has been requesting to leave the hospital over the past several days, on 9/26 this  MD had a discussion with patient, spouse at bedside-explained risks of leaving the hospital before MD recommendation-risks include death, limb loss, and life-threatening disability.  Patient accepted all risks-and was insisting on leaving the hospital on 9/26.  I given him a 275-monthupply of amoxicillin with 2 refills, and have instructed him to follow-up with the infectious disease clinic.  I have spoken with the ID MD on call-Dr. HaTrixie Deishe phone-he will try to arrange for follow-up at the clinic.  DKA: Resolved with insulin infusion.  AKI on CKD stage III: AKI hemodynamically mediated, creatinine now close to usual baseline.    Follow periodically  Insulin-dependent diabetes: Overall poorly controlled, A1c 12.1.  CBGs continues to fluctuate-continue with 45 units of NovoLog 70/30 twice daily and follow-up with PCP for further optimization.   Hypertension: BP controlled-and lisinopril on discharge  Hypothyroidism: Continue levothyroxine  Peripheral neuropathy: Appears stable-continue Neurontin  Chronic hepatitis C: We will need outpatient follow-up at the ID clinic.  Narcotic seeking behavior: Per prior notes-patient exhibiting narcotic seeking behavior and requesting IV Dilaudid.  Has been reevaluated by orthopedics-is pain in his lower extremities seems disproportionate objective findings.  He does have neuropathy.  No longer on IV Dilaudid-meds were to slowly minimize and taper of oral narcotics-however on 9/26 patient decided that he wanted to be discharged.  Given his extensive history of IV drug use-he has been explained that this MD is unable to prescribe him narcotics on discharge.  Both patient and spouse aware of the recommendation that he should establish himself with a Suboxone or methadone clinic.    Polysubstance abuse: Unfortunately recently relapsed and started using IV heroin-injected in his bilateral lower extremities-which likely  caused above-noted wound/abscess  issues..  Claims he was clean for 2 years before his most recent relapse.  He has a history of numerous soft tissue infections in his bilateral upper extremity-and apparently has a history of fasciotomy in his left upper extremity.  Has been counseled extensively over the past few weeks during his inpatient stay here.  Procedures/Studies: 9/13>> debridement bilateral feet, debridement left ankle and application of wound VAC (Dr. Sharol Given)  9/12>> TEE No vegetations are seen. There is 1-2+ central aortic insufficiency.  Mild LVH. Chiari network (normal variant) is present.  9/9>>TTE: - Left ventricle: There was mild concentric hypertrophy. Systolic function was normal. The estimated ejection fraction was in the range of 60% to 65%. Left ventricular diastolic function parameters were normal. - Aortic valve: Transvalvular velocity was within the normal range. There was no stenosis. There was mild regurgitation. Valve area (VTI): 2.83 cm^2. Valve area (Vmax): 2.61 cm^2. Valve area (Vmean): 2.72 cm^2. Regurgitation pressure half-time: 944 ms. - Left atrium: The atrium was mildly dilated. - Atrial septum: No defect or patent foramen ovale was identified by color flow Doppler. - Tricuspid valve: There was mild regurgitation. - Pulmonic valve: There was moderate regurgitation. - Pulmonary arteries: Systolic pressure was within the normal range. PA peak pressure: 30 mm Hg (S). - Pericardium, extracardiac: A trivial pericardial effusion was identified.   Discharge Diagnoses:  Principal Problem:   Streptococcal bacteremia Active Problems:   Drug abuse, IV ([place])   Tobacco abuse   Essential hypertension   Abscess   DKA (diabetic ketoacidoses) ([name])   Diabetic polyneuropathy associated with type 1 diabetes mellitus ([name])   GERD (gastroesophageal reflux disease)   Sepsis due to cellulitis ([name])   Hepatitis C   Actinomyces infection   Cellulitis of left foot   Acute  hematogenous osteomyelitis, left ankle and foot ([place])   Discharge Instructions:  Activity:  As tolerated with Full fall precautions use walker/cane & assistance as needed  Discharge Instructions    Call MD for:  redness, tenderness, or signs of infection (pain, swelling, redness, odor or green/yellow discharge around incision site)   Complete by:  As directed    Diet - low sodium heart healthy   Complete by:  As directed    Discharge instructions   Complete by:  As directed    Follow with Primary MD  Gildardo Pounds, NP in 1 week  Follow with ID clinic-they should be calling you with a appointment, if you do not hear from them please give them a call.  Follow with orthopedics-Dr. Sharol Given in the next 1-2 weeks.  Please get a complete blood count and chemistry panel checked by your Primary MD at your next visit, and again as instructed by your Primary MD.  Get Medicines reviewed and adjusted: Please take all your medications with you for your next visit with your Primary MD  Laboratory/radiological data: Please request your Primary MD to go over all hospital tests and procedure/radiological results at the follow up, please ask your Primary MD to get all Hospital records sent to his/her office.  In some cases, they will be blood work, cultures and biopsy results pending at the time of your discharge. Please request that your primary care M.D. follows up on these results.  Also Note the following: If you experience worsening of your admission symptoms, develop shortness of breath, life threatening emergency, suicidal or homicidal thoughts you must seek medical attention immediately by calling 911 or calling your MD immediately  if  symptoms less severe.  You must read complete instructions/literature along with all the possible adverse reactions/side effects for all the Medicines you take and that have been prescribed to you. Take any new Medicines after you have completely understood and  accpet all the possible adverse reactions/side effects.   Do not drive when taking Pain medications or sleeping medications (Benzodaizepines)  Do not take more than prescribed Pain, Sleep and Anxiety Medications. It is not advisable to combine anxiety,sleep and pain medications without talking with your primary care practitioner  Special Instructions: If you have smoked or chewed Tobacco  in the last 2 yrs please stop smoking, stop any regular Alcohol  and or any Recreational drug use.  Wear Seat belts while driving.  Please note: You were cared for by a hospitalist during your hospital stay. Once you are discharged, your primary care physician will handle any further medical issues. Please note that NO REFILLS for any discharge medications will be authorized once you are discharged, as it is imperative that you return to your primary care physician (or establish a relationship with a primary care physician if you do not have one) for your post hospital discharge needs so that they can reassess your need for medications and monitor your lab values.   Increase activity slowly   Complete by:  As directed      Allergies as of 12/12/2017   No Known Allergies     Medication List    TAKE these medications   acetaminophen 500 MG tablet Commonly known as:  TYLENOL Take 2,000 mg by mouth every 6 (six) hours as needed for mild pain.   acetaminophen 325 MG tablet Commonly known as:  TYLENOL Take 2 tablets (650 mg total) by mouth every 6 (six) hours as needed for mild pain (or Fever >/= 101).   amoxicillin 875 MG tablet Commonly known as:  AMOXIL Take 1 tablet (875 mg total) by mouth 2 (two) times daily.   gabapentin 400 MG capsule Commonly known as:  NEURONTIN Take 1 capsule (400 mg total) by mouth 3 (three) times daily.   glucose blood test strip Use as instructed   insulin aspart 100 UNIT/ML injection Commonly known as:  novoLOG Inject 0-15 Units into the skin 3 (three) times daily  with meals.   insulin aspart protamine- aspart (70-30) 100 UNIT/ML injection Commonly known as:  NOVOLOG MIX 70/30 Inject 0.45 mLs (45 Units total) into the skin 2 (two) times daily with a meal. What changed:  how much to take   Insulin Syringe-Needle U-100 31G X 5/16" 0.5 ML Misc USE AS DIRECTED.   levothyroxine 50 MCG tablet Commonly known as:  SYNTHROID, LEVOTHROID Take 1 tablet (50 mcg total) by mouth daily.   lisinopril 10 MG tablet Commonly known as:  PRINIVIL,ZESTRIL Take 1 tablet (10 mg total) by mouth daily.   ranitidine 150 MG tablet Commonly known as:  ZANTAC Take 1 tablet (150 mg total) by mouth 2 (two) times daily.   silver sulfADIAZINE 1 % cream Commonly known as:  SILVADENE Apply topically daily.   silver sulfADIAZINE 1 % cream Commonly known as:  SILVADENE Apply 1 application topically daily. Apply to affected area daily plus dry dressing   traZODone 50 MG tablet Commonly known as:  DESYREL Take 1 tablet (50 mg total) by mouth at bedtime.   TRUE METRIX METER Devi 1 kit by Does not apply route as directed. Use as directed   TRUEPLUS LANCETS 28G Misc Use as directed  Follow-up [facility] Follow up on 12/05/2017.   Why:  3:30 pm, post hospital follow up Contact information: [place] [phone]       [name], MD In 1 week.   Specialty:  Orthopedic Surgery Contact information: [place] [address] [phone]        [facility]. Schedule an appointment as soon as possible for a visit in 2 week(s).   Specialty:  Infectious Diseases Why:  please call office-if you do not hear from them Contact information: [place], [place] [id number] [place] [phone]         No Known Allergies  Consultations:   ID and orthopedic  surgery  Other Procedures/Studies: Mr Foot Left W Wo Contrast  Result Date: 11/22/2017 CLINICAL DATA:  IV drug abuser who injects in his feet. Bilateral foot wounds for 1 week. Chills and malaise. EXAM: MRI OF THE LEFT FOREFOOT WITHOUT AND WITH CONTRAST TECHNIQUE: Multiplanar, multisequence MR imaging of the left forefoot was performed both before and after administration of intravenous contrast. CONTRAST:  8.5 cc Gadavist IV COMPARISON:  Plain films left foot 11/21/2017. FINDINGS: Bones/Joint/Cartilage There is partial visualization of marrow edema in the medial malleolus with postcontrast enhancement. Bone marrow signal is otherwise normal. Ligaments Intact Muscles and Tendons No intramuscular fluid collection is identified. Soft tissues Diffuse subcutaneous edema enhancement are seen about the foot. There is an abscess in the dorsal subcutaneous tissues centered over the bases of the first through third metatarsals. The abscess measures 1.8 cm craniocaudal by 4.6 cm transverse by 4.4 cm long. A second abscess in the subcutaneous tissues measuring 2.5 cm long by 2.3 cm craniocaudal by 0.9 cm transverse is identified along the proximal first metatarsal. This abscess is centered 1.5 cm distal to the first tarsometatarsal joint. A third abscess which is incompletely visualized is seen in subcutaneous tissues 3.2 cm superior to the tip of the medial malleolus. This collection measures approximately 1.5 cm craniocaudal by 0.2 cm transverse. IMPRESSION: Three subcutaneous abscesses are identified as described above. The largest collection is in the dorsal soft tissues at the level of the proximal first and third metatarsals. Edema and enhancement the medial malleolus consistent with osteomyelitis. Electronically Signed   By: Inge Rise M.D.   On: 11/22/2017 13:01   Dg Foot Complete Left  Result Date: 11/21/2017 CLINICAL DATA:  Bilateral foot pain for 4-5 days, left-greater-than-right, swelling and open  sores, the patient is diabetic, and has a smoking history EXAM: LEFT FOOT - COMPLETE 3+ VIEW COMPARISON:  None. FINDINGS: There is soft tissue swelling over the dorsum of the left foot. However no underlying focal demineralization or erosion is seen to indicate osteomyelitis. No acute abnormality is noted. Joint spaces appear normal. No erosion is seen. IMPRESSION: Soft tissue swelling over the dorsum of the foot. No underlying bony abnormality. Electronically Signed   By: Ivar Drape M.D.   On: 11/21/2017 15:30   Dg Foot Complete Right  Result Date: 11/21/2017 CLINICAL DATA:  Bilateral foot pain for 4-5 days, left-greater-than-right with swelling and redness, the patient is diabetic and does have a smoking history EXAM: RIGHT FOOT COMPLETE - 3+ VIEW COMPARISON:  None FINDINGS: There is mild soft tissue swelling over the dorsum of the right foot. No underlying bony demineralization or erosion is seen to indicate active osteomyelitis. Alignment is normal  and joint spaces appear normal. IMPRESSION: Soft tissue swelling over the dorsum. No evidence of active osteomyelitis. Electronically Signed   By: Ivar Drape M.D.   On: 11/21/2017 15:32   [place] Ekg Site Rite  Result Date: 11/22/2017 If Site Rite image not attached, placement could not be confirmed due to current cardiac rhythm.     TODAY-DAY OF DISCHARGE:  Subjective:   Luke [name] today has no headache,no chest abdominal pain,no new weakness tingling or numbness, feels much better wants to go home today.   Objective:   Blood pressure 132/71, pulse 87, temperature 98.8 F (37.1 C), temperature source Oral, resp. rate 16, height '5\' 9"'$  (1.753 m), weight 86.8 kg, SpO2 98 %.  Intake/Output Summary (Last 24 hours) at 12/12/2017 1442 Last data filed at 12/12/2017 1211 Gross per 24 hour  Intake 490 ml  Output 3000 ml  Net -2510 ml   Filed Weights   11/23/17 0227 11/28/17 0729  Weight: 86.8 kg 86.8 kg    Exam: Awake Alert, Oriented *3, No new  F.N deficits, Normal affect [place].AT,PERRAL Supple Neck,No JVD, No cervical lymphadenopathy appriciated.  Symmetrical Chest wall movement, Good air movement bilaterally, CTAB RRR,No Gallops,Rubs or new Murmurs, No Parasternal Heave +ve B.Sounds, Abd Soft, Non tender, No organomegaly appriciated, No rebound -guarding or rigidity. No Cyanosis, Clubbing or edema, No new Rash or bruise   PERTINENT RADIOLOGIC STUDIES: Mr Foot Left W Wo Contrast  Result Date: 11/22/2017 CLINICAL DATA:  IV drug abuser who injects in his feet. Bilateral foot wounds for 1 week. Chills and malaise. EXAM: MRI OF THE LEFT FOREFOOT WITHOUT AND WITH CONTRAST TECHNIQUE: Multiplanar, multisequence MR imaging of the left forefoot was performed both before and after administration of intravenous contrast. CONTRAST:  8.5 cc Gadavist IV COMPARISON:  Plain films left foot 11/21/2017. FINDINGS: Bones/Joint/Cartilage There is partial visualization of marrow edema in the medial malleolus with postcontrast enhancement. Bone marrow signal is otherwise normal. Ligaments Intact Muscles and Tendons No intramuscular fluid collection is identified. Soft tissues Diffuse subcutaneous edema enhancement are seen about the foot. There is an abscess in the dorsal subcutaneous tissues centered over the bases of the first through third metatarsals. The abscess measures 1.8 cm craniocaudal by 4.6 cm transverse by 4.4 cm long. A second abscess in the subcutaneous tissues measuring 2.5 cm long by 2.3 cm craniocaudal by 0.9 cm transverse is identified along the proximal first metatarsal. This abscess is centered 1.5 cm distal to the first tarsometatarsal joint. A third abscess which is incompletely visualized is seen in subcutaneous tissues 3.2 cm superior to the tip of the medial malleolus. This collection measures approximately 1.5 cm craniocaudal by 0.2 cm transverse. IMPRESSION: Three subcutaneous abscesses are identified as described above. The largest  collection is in the dorsal soft tissues at the level of the proximal first and third metatarsals. Edema and enhancement the medial malleolus consistent with osteomyelitis. Electronically Signed   By: Inge Rise M.D.   On: 11/22/2017 13:01   Dg Foot Complete Left  Result Date: 11/21/2017 CLINICAL DATA:  Bilateral foot pain for 4-5 days, left-greater-than-right, swelling and open sores, the patient is diabetic, and has a smoking history EXAM: LEFT FOOT - COMPLETE 3+ VIEW COMPARISON:  None. FINDINGS: There is soft tissue swelling over the dorsum of the left foot. However no underlying focal demineralization or erosion is seen to indicate osteomyelitis. No acute abnormality is noted. Joint spaces appear normal. No erosion is seen. IMPRESSION: Soft tissue swelling over the dorsum of  the foot. No underlying bony abnormality. Electronically Signed   By: Ivar Drape M.D.   On: 11/21/2017 15:30   Dg Foot Complete Right  Result Date: 11/21/2017 CLINICAL DATA:  Bilateral foot pain for 4-5 days, left-greater-than-right with swelling and redness, the patient is diabetic and does have a smoking history EXAM: RIGHT FOOT COMPLETE - 3+ VIEW COMPARISON:  None FINDINGS: There is mild soft tissue swelling over the dorsum of the right foot. No underlying bony demineralization or erosion is seen to indicate active osteomyelitis. Alignment is normal and joint spaces appear normal. IMPRESSION: Soft tissue swelling over the dorsum. No evidence of active osteomyelitis. Electronically Signed   By: Ivar Drape M.D.   On: 11/21/2017 15:32   [place] Ekg Site Rite  Result Date: 11/22/2017 If Site Rite image not attached, placement could not be confirmed due to current cardiac rhythm.    PERTINENT LAB RESULTS: CBC: Recent Labs    12/10/17 0400  WBC 10.0  HGB 9.6*  HCT 30.2*  PLT 428*   CMET CMP     Component Value Date/Time   NA 136 12/10/2017 0400   NA 138 04/21/2014 0745   K 4.9 12/10/2017 0400   K 4.7 04/21/2014  1453   CL 106 12/10/2017 0400   CL 108 (H) 04/21/2014 0745   CO2 23 12/10/2017 0400   CO2 17 (L) 04/21/2014 0745   GLUCOSE 266 (H) 12/10/2017 0400   GLUCOSE 295 (H) 04/21/2014 0745   BUN 12 12/10/2017 0400   BUN 23 (H) 04/21/2014 0745   CREATININE 1.42 (H) 12/12/2017 0321   CREATININE 1.31 07/23/2014 1225   CALCIUM 9.0 12/10/2017 0400   CALCIUM 9.2 04/21/2014 0745   PROT 5.9 (L) 12/10/2017 0400   PROT 6.7 04/19/2014 0203   ALBUMIN 2.6 (L) 12/10/2017 0400   ALBUMIN 3.6 04/19/2014 0203   AST 23 12/10/2017 0400   AST 30 04/19/2014 0203   ALT 25 12/10/2017 0400   ALT 88 (H) 04/19/2014 0203   ALKPHOS 93 12/10/2017 0400   ALKPHOS 179 (H) 04/19/2014 0203   BILITOT 0.4 12/10/2017 0400   BILITOT < 0.1 (L) 04/19/2014 0203   GFRNONAA >60 12/12/2017 0321   GFRNONAA 71 07/23/2014 1225   GFRAA >60 12/12/2017 0321   GFRAA 82 07/23/2014 1225    GFR Estimated Creatinine Clearance: 77.7 mL/min (A) (by C-G formula based on SCr of 1.42 mg/dL (H)). No results for input(s): LIPASE, AMYLASE in the last 72 hours. No results for input(s): CKTOTAL, CKMB, CKMBINDEX, TROPONINI in the last 72 hours. Invalid input(s): POCBNP No results for input(s): DDIMER in the last 72 hours. No results for input(s): HGBA1C in the last 72 hours. No results for input(s): CHOL, HDL, LDLCALC, TRIG, CHOLHDL, LDLDIRECT in the last 72 hours. No results for input(s): TSH, T4TOTAL, T3FREE, THYROIDAB in the last 72 hours.  Invalid input(s): FREET3 No results for input(s): VITAMINB12, FOLATE, FERRITIN, TIBC, IRON, RETICCTPCT in the last 72 hours. Coags: No results for input(s): INR in the last 72 hours.  Invalid input(s): PT Microbiology: No results found for this or any previous visit (from the past 240 hour(s)).  FURTHER DISCHARGE INSTRUCTIONS:  Get Medicines reviewed and adjusted: Please take all your medications with you for your next visit with your Primary MD  Laboratory/radiological data: Please request your  Primary MD to go over all hospital tests and procedure/radiological results at the follow up, please ask your Primary MD to get all Hospital records sent to his/her office.  In some  cases, they will be blood work, cultures and biopsy results pending at the time of your discharge. Please request that your primary care M.D. goes through all the records of your hospital data and follows up on these results.  Also Note the following: If you experience worsening of your admission symptoms, develop shortness of breath, life threatening emergency, suicidal or homicidal thoughts you must seek medical attention immediately by calling 911 or calling your MD immediately  if symptoms less severe.  You must read complete instructions/literature along with all the possible adverse reactions/side effects for all the Medicines you take and that have been prescribed to you. Take any new Medicines after you have completely understood and accpet all the possible adverse reactions/side effects.   Do not drive when taking Pain medications or sleeping medications (Benzodaizepines)  Do not take more than prescribed Pain, Sleep and Anxiety Medications. It is not advisable to combine anxiety,sleep and pain medications without talking with your primary care practitioner  Special Instructions: If you have smoked or chewed Tobacco  in the last 2 yrs please stop smoking, stop any regular Alcohol  and or any Recreational drug use.  Wear Seat belts while driving.  Please note: You were cared for by a hospitalist during your hospital stay. Once you are discharged, your primary care physician will handle any further medical issues. Please note that NO REFILLS for any discharge medications will be authorized once you are discharged, as it is imperative that you return to your primary care physician (or establish a relationship with a primary care physician if you do not have one) for your post hospital discharge needs so that they  can reassess your need for medications and monitor your lab values.  Total Time spent coordinating discharge including counseling, education and face to face time equals 35 minutes.  SignedOren Binet 12/12/2017 2:42 PM

## 2017-12-12 NOTE — Progress Notes (Signed)
Instructed patient on procedure. HOB less than 45*. Pt. Held breath upon line removal. Pressure held for 5 min. No s/sx of bleeding at this time. Instructed pt to remain in bed for 30 min. Monitor and report any s/sx of bleeding. Leave drsg CDI for 24 hours. Pt VU. Fran Lowes, RN VAST

## 2017-12-12 NOTE — Progress Notes (Signed)
Nsg Discharge Note  Admit Date:  11/21/2017 Discharge date: 12/12/2017   Luke [name] to be D/C'd Home per MD order.  AVS completed.  Copy for chart, and copy for patient signed, and dated. Patient/caregiver able to verbalize understanding.  Discharge Medication: Allergies as of 12/12/2017   No Known Allergies     Medication List    TAKE these medications   acetaminophen 500 MG tablet Commonly known as:  TYLENOL Take 2,000 mg by mouth every 6 (six) hours as needed for mild pain.   acetaminophen 325 MG tablet Commonly known as:  TYLENOL Take 2 tablets (650 mg total) by mouth every 6 (six) hours as needed for mild pain (or Fever >/= 101).   amoxicillin 875 MG tablet Commonly known as:  AMOXIL Take 1 tablet (875 mg total) by mouth 2 (two) times daily.   gabapentin 400 MG capsule Commonly known as:  NEURONTIN Take 1 capsule (400 mg total) by mouth 3 (three) times daily.   glucose blood test strip Use as instructed   insulin aspart 100 UNIT/ML injection Commonly known as:  novoLOG Inject 0-15 Units into the skin 3 (three) times daily with meals.   insulin aspart protamine- aspart (70-30) 100 UNIT/ML injection Commonly known as:  NOVOLOG MIX 70/30 Inject 0.45 mLs (45 Units total) into the skin 2 (two) times daily with a meal. What changed:  how much to take   Insulin Syringe-Needle U-100 31G X 5/16" 0.5 ML Misc USE AS DIRECTED.   levothyroxine 50 MCG tablet Commonly known as:  SYNTHROID, LEVOTHROID Take 1 tablet (50 mcg total) by mouth daily.   lisinopril 10 MG tablet Commonly known as:  PRINIVIL,ZESTRIL Take 1 tablet (10 mg total) by mouth daily.   ranitidine 150 MG tablet Commonly known as:  ZANTAC Take 1 tablet (150 mg total) by mouth 2 (two) times daily.   silver sulfADIAZINE 1 % cream Commonly known as:  SILVADENE Apply topically daily.   silver sulfADIAZINE 1 % cream Commonly known as:  SILVADENE Apply 1 application topically daily. Apply to affected area  daily plus dry dressing   traZODone 50 MG tablet Commonly known as:  DESYREL Take 1 tablet (50 mg total) by mouth at bedtime.   TRUE METRIX METER Devi 1 kit by Does not apply route as directed. Use as directed   TRUEPLUS LANCETS 28G Misc Use as directed       Discharge Assessment: Vitals:   12/12/17 0419 12/12/17 0541  BP: 123/75 132/71  Pulse: 87   Resp: 17 16  Temp: 98.1 F (36.7 C) 98.8 F (37.1 C)  SpO2: 98% 98%   Skin clean, dry and intact without evidence of skin break down, no evidence of skin tears noted. IV catheter discontinued intact. Site without signs and symptoms of complications - no redness or edema noted at insertion site, patient denies c/o pain - only slight tenderness at site.  Dressing with slight pressure applied.  D/c Instructions-Education: Discharge instructions given to patient/family with verbalized understanding. D/c education completed with patient/family including follow up instructions, medication list, d/c activities limitations if indicated, with other d/c instructions as indicated by MD - patient able to verbalize understanding, all questions fully answered. Patient instructed to return to ED, call 911, or call MD for any changes in condition.  Patient escorted via [place], and D/C home via private auto.  Cristela Blue, RN 12/12/2017 5:18 PM

## 2017-12-13 MED FILL — raNITIdine HCL 150 MG TABS: 150 | 30 days supply | Qty: 60 | Fill #0

## 2017-12-13 MED FILL — AMOXICILLIN 875 MG TABS: 875 | 30 days supply | Qty: 60 | Fill #0

## 2017-12-13 MED FILL — traZODone HCL 50 MG TABS: 50 | 30 days supply | Qty: 30 | Fill #0

## 2017-12-13 MED FILL — NOVOLIN 70/30 100 UNITS/ML: (70-30) 100 | 11 days supply | Qty: 10 | Fill #0

## 2017-12-13 MED FILL — LEVOTHYROXINE 50 MCG TABLET: 50 | 30 days supply | Qty: 30 | Fill #1

## 2017-12-13 MED FILL — LISINOPRIL 10 MG TABS: 10 | 30 days supply | Qty: 30 | Fill #3

## 2017-12-13 MED FILL — GABAPENTIN 400 MG CAPSULE: 400 | 30 days supply | Qty: 90 | Fill #2

## 2017-12-16 ENCOUNTER — Ambulatory Visit: Payer: Self-pay | Admitting: Nurse Practitioner

## 2017-12-18 ENCOUNTER — Encounter (INDEPENDENT_AMBULATORY_CARE_PROVIDER_SITE_OTHER): Payer: Self-pay | Admitting: Family

## 2017-12-18 ENCOUNTER — Ambulatory Visit (INDEPENDENT_AMBULATORY_CARE_PROVIDER_SITE_OTHER): Payer: Self-pay | Admitting: Family

## 2017-12-18 VITALS — Ht 69.0 in | Wt 191.4 lb

## 2017-12-18 DIAGNOSIS — E1042 Type 1 diabetes mellitus with diabetic polyneuropathy: Secondary | ICD-10-CM

## 2017-12-18 DIAGNOSIS — M86072 Acute hematogenous osteomyelitis, left ankle and foot: Secondary | ICD-10-CM

## 2017-12-18 DIAGNOSIS — L03116 Cellulitis of left lower limb: Secondary | ICD-10-CM

## 2017-12-18 MED ORDER — TRAMADOL HCL 50 MG PO TABS: 50.0000 mg | ORAL_TABLET | Freq: Two times a day (BID) | ORAL | 0 refills | Status: DC

## 2017-12-18 NOTE — Progress Notes (Signed)
Post-Op Visit Note   [name]           Date of Birth: 1980/06/07           MRN: [id number] Visit Date: 12/18/2017 PCP: Gildardo Pounds, NP  Chief Complaint:  Chief Complaint  Patient presents with  . Left Foot - Routine Post Op    HPI:  HPI  The patient is a 37 year old gentleman seen today for first post op appointment. Had Iand D of bilateral feet on 9/13. States the right foot is well healed. The left has dorsal ulceration. Has been doing daily silvadene dressing changes. Wife is rn and is providing wound care. Still taking amox daily as well. Complains of swelling. states slept sitting up in chair last night . Ortho Exam On examination of right foot. Incision is nearly healed. No drainage. No erythema. No odor or sign of infection. The left foot has two medial incisions which are healing well. Has dorsal ulcer. This is 2.5 cm in diameter. There is granulation and about 80% exudative tissue. Debrided with gauze. No odor. Moderate swelling. No odor. No warmth no ascending cellulitis.   Visit Diagnoses:  1. Cellulitis of left foot   2. Acute hematogenous osteomyelitis, left ankle and foot ([place])   3. Diabetic polyneuropathy associated with type 1 diabetes mellitus ([place])     Plan: continue cleansing incisions and wound daily. Provided plurogel to apply daily. Applied with dressing today. Instructed on elevation and return precautions. Continue amox. Follow up in 2 weeks.   Follow-Up Instructions: Return in about 2 weeks (around 01/01/2018).   Imaging: No results found.  Orders:  No orders of the defined types were placed in this encounter.  No orders of the defined types were placed in this encounter.    PMFS History: Patient Active Problem List   Diagnosis Date Noted  . Acute hematogenous osteomyelitis, left ankle and foot ([place])   . Streptococcal bacteremia 11/25/2017  . Hepatitis C 11/25/2017  . Actinomyces infection   . Cellulitis of left foot   .  Acute renal failure superimposed on stage 2 chronic kidney disease ([place]) 07/21/2017  . GERD (gastroesophageal reflux disease) 07/21/2017  . Diabetic polyneuropathy associated with type 1 diabetes mellitus ([place])   . Prostatitis, acute   . MRSA infection   . Noncompliant bladder   . Urinary retention   . Prostate abscess   . ARF (acute renal failure) ([place]) 12/16/2016  . Anemia 12/16/2016  . Cocaine abuse ([place]) 08/16/2015  . Uncontrolled insulin dependent type 1 diabetes mellitus ([place]) 08/16/2015  . AKI (acute kidney injury) ([place]) 12/03/2014  . Elevated liver enzymes 07/26/2014  . Dental caries 07/23/2014  . Tinea pedis 07/23/2014  . DKA (diabetic ketoacidoses) ([place]) 11/23/2013  . Abscess 10/14/2012  . 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 Tobacco abuse 10/11/2012  . Essential hypertension 10/11/2012   Past Medical History:  Diagnosis Date  . Anemia   . Anxiety  Dx 2008  . Diabetes mellitus without complication ([place]) Dx 6295  . DKA (diabetic ketoacidoses) ([place]) 07/21/2017  . GERD (gastroesophageal reflux disease) Dx 2008  . Headache(784.0)   . Pneumonia   . Seizures ([place]) 2011    x 2 in lifetime. on Dilantin for a while.   . 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 Diabetes Father   . Heart disease Father   .  Mental illness Sister   . Cancer Neg Hx     Past Surgical History:  Procedure Laterality Date  . I&D EXTREMITY Left 10/11/2012   Procedure: IRRIGATION AND DEBRIDEMENT ABSCESS FOREARM;  Surgeon: Linna Hoff, MD;  Location: [place];  Service: Orthopedics;  Laterality: Left;  . I&D EXTREMITY Left 10/12/2012   Procedure: IRRIGATION AND DEBRIDEMENT FOREARM;  Surgeon: Linna Hoff, MD;  Location: [place];  Service: Orthopedics;  Laterality: Left;  . I&D EXTREMITY Left 10/14/2012   Procedure: incision and drainage left forearm;  Surgeon: Linna Hoff, MD;  Location: [place];  Service: Orthopedics;   Laterality: Left;  . I&D EXTREMITY Left 10/16/2012   Procedure: IRRIGATION AND DEBRIDEMENT LEFT FOREARM;  Surgeon: Linna Hoff, MD;  Location: [place];  Service: Orthopedics;  Laterality: Left;  . I&D EXTREMITY Left 10/20/2012   Procedure: INCISION AND DRAINAGE AND DEBRIDEMENT LEFT  FOREARM;  Surgeon: Linna Hoff, MD;  Location: [place];  Service: Orthopedics;  Laterality: Left;  . INCISION AND DRAINAGE OF WOUND Bilateral 11/29/2017   Procedure: DEBRIDEMENT BILATERAL FEET, DEBRIDEMENT LEFT ANKLE, AND APPLY WOUND VAC;  Surgeon: [name], MD;  Location: [place];  Service: Orthopedics;  Laterality: Bilateral;  . IRRIGATION AND DEBRIDEMENT ABSCESS     Hx: of left arm abscess related to drug use   . TEE WITHOUT CARDIOVERSION N/A 11/28/2017   Procedure: TRANSESOPHAGEAL ECHOCARDIOGRAM (TEE);  Surgeon: Sanda Klein, MD;  Location: [facility] ENDOSCOPY;  Service: Cardiovascular;  Laterality: N/A;   Social History   Occupational History  . Occupation: Event set up    Employer: [place] [employer]   . Occupation: Stencil for cars   Tobacco Use  . Smoking status: Current Every Day Smoker    Packs/day: 1.00    Types: Cigarettes  . Smokeless tobacco: Never Used  Substance and Sexual Activity  . Alcohol use: No    Alcohol/week: 0.0 standard drinks    Comment: occasional  . Drug use: Not Currently    Types: Cocaine, IV, Heroin    Comment: IVDU since Dad died in 2006/06/10 with 15 month period of sobriety 2018-2019, relapse Aug 2019.  Marland Kitchen Sexual activity: Yes    Partners: Female    Birth control/protection: None

## 2017-12-26 ENCOUNTER — Telehealth: Payer: Self-pay

## 2017-12-26 MED ORDER — INSULIN ASPART PROT & ASPART (70-30 MIX) 100 UNIT/ML ~~LOC~~ SUSP: 45.0000 [IU] | Freq: Two times a day (BID) | SUBCUTANEOUS | 0 refills | Status: DC

## 2017-12-26 NOTE — Telephone Encounter (Signed)
Refill sent to pharmacy on file

## 2017-12-26 NOTE — Telephone Encounter (Signed)
Pharmacy stated patient call asking for Novolin refills. Pharmacy stated they will need PCP to send a new request per his last filled was by Emergency room.

## 2017-12-27 NOTE — Telephone Encounter (Signed)
CMA spoke to patient to inform refill was sent to [facility] pharmacy. Pt. Understood.

## 2017-12-31 ENCOUNTER — Ambulatory Visit: Payer: Self-pay | Admitting: Nurse Practitioner

## 2018-01-01 ENCOUNTER — Ambulatory Visit (INDEPENDENT_AMBULATORY_CARE_PROVIDER_SITE_OTHER): Payer: MEDICAID | Admitting: Family

## 2018-01-01 ENCOUNTER — Encounter (INDEPENDENT_AMBULATORY_CARE_PROVIDER_SITE_OTHER): Payer: Self-pay | Admitting: Family

## 2018-01-01 VITALS — Ht 69.0 in | Wt 191.4 lb

## 2018-01-01 DIAGNOSIS — S91302A Unspecified open wound, left foot, initial encounter: Secondary | ICD-10-CM

## 2018-01-01 NOTE — Progress Notes (Signed)
Post-Op Visit Note   [name]           Date of Birth: 01/15/1981           MRN: [id number] Visit Date: 01/01/2018 PCP: Gildardo Pounds, NP  Chief Complaint:  Chief Complaint  Patient presents with  . Left Foot - Wound Check    HPI:  HPI  The patient is a 37 year old gentleman seen today for post op appointment. Had I and D of bilateral feet on 9/13. The right foot is well healed. The left has dorsal ulceration remains. Has been doing daily silvadene dressing changes. Wife is rn and is providing wound care as well, on days she does wound care she applies pluragel.   Patient feels is doing well. Full weight bearing in regular shoe wear.   Ortho Exam On examination of right foot. Incision is well healed. No drainage. No erythema. No odor or sign of infection. The left foot has dorsal ulcer. This is 2 x 3 cm in size. There is granulation which is nearly full after debriding of exudative tissue. Moderate swelling. No odor. No warmth no ascending cellulitis.   Visit Diagnoses:  No diagnosis found.  Plan: continue cleansing wound daily. Applied iodosorb dressing today. Instructed on elevation and return precautions.  Follow up in 2 weeks with duda.  Follow-Up Instructions: No follow-ups on file.   Imaging: No results found.  Orders:  No orders of the defined types were placed in this encounter.  No orders of the defined types were placed in this encounter.    PMFS History: Patient Active Problem List   Diagnosis Date Noted  . Acute hematogenous osteomyelitis, left ankle and foot ([place])   . Streptococcal bacteremia 11/25/2017  . Hepatitis C 11/25/2017  . Actinomyces infection   . Cellulitis of left foot   . Acute renal failure superimposed on stage 2 chronic kidney disease ([place]) 07/21/2017  . GERD (gastroesophageal reflux disease) 07/21/2017  . Diabetic polyneuropathy associated with type 1 diabetes mellitus ([place])   . Prostatitis, acute   . MRSA infection     . Noncompliant bladder   . Urinary retention   . Prostate abscess   . ARF (acute renal failure) ([place]) 12/16/2016  . Anemia 12/16/2016  . Cocaine abuse ([place]) 08/16/2015  . Uncontrolled insulin dependent type 1 diabetes mellitus ([place]) 08/16/2015  . AKI (acute kidney injury) ([place]) 12/03/2014  . Elevated liver enzymes 07/26/2014  . Dental caries 07/23/2014  . Tinea pedis 07/23/2014  . DKA (diabetic ketoacidoses) ([place]) 11/23/2013  . Abscess 10/14/2012  . 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 Tobacco abuse 10/11/2012  . Essential hypertension 10/11/2012   Past Medical History:  Diagnosis Date  . Anemia   . Anxiety  Dx 2008  . Diabetes mellitus without complication ([place]) Dx 8413  . DKA (diabetic ketoacidoses) ([place]) 07/21/2017  . GERD (gastroesophageal reflux disease) Dx 2008  . Headache(784.0)   . Pneumonia   . Seizures ([place]) 2011    x 2 in lifetime. on Dilantin for a while.   . 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 Diabetes Father   . Heart disease Father   . Mental illness Sister   . Cancer Neg Hx     Past Surgical History:  Procedure Laterality Date  . I&D EXTREMITY Left 10/11/2012   Procedure: IRRIGATION AND DEBRIDEMENT ABSCESS FOREARM;  Surgeon: Linna Hoff, MD;  Location: [place];  Service: Orthopedics;  Laterality: Left;  . I&D EXTREMITY Left 10/12/2012   Procedure: IRRIGATION AND DEBRIDEMENT FOREARM;  Surgeon: Linna Hoff, MD;  Location: [place];  Service: Orthopedics;  Laterality: Left;  . I&D EXTREMITY Left 10/14/2012   Procedure: incision and drainage left forearm;  Surgeon: Linna Hoff, MD;  Location: [place];  Service: Orthopedics;  Laterality: Left;  . I&D EXTREMITY Left 10/16/2012   Procedure: IRRIGATION AND DEBRIDEMENT LEFT FOREARM;  Surgeon: Linna Hoff, MD;  Location: [place];  Service: Orthopedics;  Laterality: Left;  . I&D EXTREMITY Left 10/20/2012   Procedure: INCISION AND DRAINAGE  AND DEBRIDEMENT LEFT  FOREARM;  Surgeon: Linna Hoff, MD;  Location: [place];  Service: Orthopedics;  Laterality: Left;  . INCISION AND DRAINAGE OF WOUND Bilateral 11/29/2017   Procedure: DEBRIDEMENT BILATERAL FEET, DEBRIDEMENT LEFT ANKLE, AND APPLY WOUND VAC;  Surgeon: [name], MD;  Location: [place];  Service: Orthopedics;  Laterality: Bilateral;  . IRRIGATION AND DEBRIDEMENT ABSCESS     Hx: of left arm abscess related to drug use   . TEE WITHOUT CARDIOVERSION N/A 11/28/2017   Procedure: TRANSESOPHAGEAL ECHOCARDIOGRAM (TEE);  Surgeon: Sanda Klein, MD;  Location: [facility] ENDOSCOPY;  Service: Cardiovascular;  Laterality: N/A;   Social History   Occupational History  . Occupation: Event set up    Employer: [place]   . Occupation: Stencil for cars   Tobacco Use  . Smoking status: Current Every Day Smoker    Packs/day: 1.00    Types: Cigarettes  . Smokeless tobacco: Never Used  Substance and Sexual Activity  . Alcohol use: No    Alcohol/week: 0.0 standard drinks    Comment: occasional  . Drug use: Not Currently    Types: Cocaine, IV, Heroin    Comment: IVDU since Dad died in 24-Jun-2006 with 15 month period of sobriety 2018-2019, relapse Aug 2019.  Marland Kitchen Sexual activity: Yes    Partners: Female    Birth control/protection: None

## 2018-01-10 MED FILL — LISINOPRIL 10 MG TABS: 10 | 30 days supply | Qty: 30 | Fill #3

## 2018-01-10 MED FILL — LEVOTHYROXINE 50 MCG TABLET: 50 | 30 days supply | Qty: 30 | Fill #1

## 2018-01-10 MED FILL — GABAPENTIN 400 MG CAPSULE: 400 | 30 days supply | Qty: 90 | Fill #3

## 2018-01-10 MED FILL — AMOXICILLIN 875 MG TABS: 875 | 30 days supply | Qty: 60 | Fill #1

## 2018-01-16 ENCOUNTER — Ambulatory Visit (INDEPENDENT_AMBULATORY_CARE_PROVIDER_SITE_OTHER): Payer: Self-pay | Admitting: Physician Assistant

## 2018-01-16 ENCOUNTER — Encounter (INDEPENDENT_AMBULATORY_CARE_PROVIDER_SITE_OTHER): Payer: Self-pay | Admitting: Orthopedic Surgery

## 2018-01-16 VITALS — Ht 69.0 in | Wt 191.4 lb

## 2018-01-16 DIAGNOSIS — S91302A Unspecified open wound, left foot, initial encounter: Secondary | ICD-10-CM

## 2018-01-16 DIAGNOSIS — E1042 Type 1 diabetes mellitus with diabetic polyneuropathy: Secondary | ICD-10-CM

## 2018-01-16 NOTE — Progress Notes (Signed)
Office Visit Note   [name]           Date of Birth: 03-Dec-1980           MRN: [id number] Visit Date: 01/16/2018              Requested by: Gildardo Pounds, NP [address] [place], [place] [address] PCP: Gildardo Pounds, NP  Chief Complaint  Patient presents with  . Left Foot - Follow-up, Wound Check  . Left Ankle - Follow-up, Wound Check      HPI: The patient is a 37 yo male with diabetes and history of IVDA with recent relapse who is seen for post operative follow up following debridement of bilateral foot ulcers with osteomyelitis of the left medial malleolus on 11/29/17. He is about 6 weeks post op. He reports burning sensations of the feet, left > right. He has an open residual wound of the left foot and has been washing the foot daily and using some silvadene cream and occasionally some Plurogel to the open wound of the left foot. He reports recent Hgb A1c was 10.1. He is wearing a regular shoe.     Assessment & Plan: Visit Diagnoses:  1. Wound of left foot   2. Diabetic polyneuropathy associated with type 1 diabetes mellitus ([place])     Plan: Will begin silver compression socks to the left foot daily after cleaning with soap and water. Recommend wearing around the clock except for showering.Follow up in 2 weeks.   Follow-Up Instructions: Return in about 2 weeks (around 01/30/2018).   Ortho Exam  Patient is alert, oriented, no adenopathy, well-dressed, normal affect, normal respiratory effort. Incisions about the right foot and the left ankle are well healed.  Small residual wound over the dorsum of the left foot ~ 1.5 x 3.5 cm with some hypergranulation. No signs of infection or cellulitis. Edema of the left foot and ankle.   Imaging: No results found.   Labs: Lab Results  Component Value Date   HGBA1C 12.1 (H) 11/22/2017   HGBA1C 10.4 (A) 10/21/2017   HGBA1C 13.1 05/15/2017   ESRSEDRATE 40 (H) 12/03/2014   CRP 9.0 (H) 11/23/2017   CRP 12.2  (H) 11/22/2017   CRP 8.7 (H) 12/03/2014   REPTSTATUS 12/04/2017 FINAL 11/29/2017   GRAMSTAIN  11/29/2017    FEW WBC PRESENT, PREDOMINANTLY PMN NO ORGANISMS SEEN    CULT  11/29/2017    NO ANAEROBES ISOLATED Performed at [facility], [place] [address] [place], [place] [address]    LABORGA METHICILLIN RESISTANT STAPHYLOCOCCUS AUREUS (A) 12/16/2016     Lab Results  Component Value Date   ALBUMIN 2.6 (L) 12/10/2017   ALBUMIN 2.6 (L) 12/04/2017   ALBUMIN 2.1 (L) 11/23/2017    Body mass index is 28.26 kg/m.  Orders:  No orders of the defined types were placed in this encounter.  No orders of the defined types were placed in this encounter.    Procedures: No procedures performed  Clinical Data: No additional findings.  ROS:  All other systems negative, except as noted in the HPI. Review of Systems  Objective: Vital Signs: Ht 5\' 9"  (1.753 m)   Wt 191 lb 6.4 oz (86.8 kg)   BMI 28.26 kg/m   Specialty Comments:  No specialty comments available.  PMFS History: Patient Active Problem List   Diagnosis Date Noted  . Acute hematogenous osteomyelitis, left ankle and foot ([place])   . Streptococcal bacteremia 11/25/2017  .  Hepatitis C 11/25/2017  . Actinomyces infection   . Acute renal failure superimposed on stage 2 chronic kidney disease ([place]) 07/21/2017  . GERD (gastroesophageal reflux disease) 07/21/2017  . Diabetic polyneuropathy associated with type 1 diabetes mellitus ([place])   . Prostatitis, acute   . MRSA infection   . Noncompliant bladder   . Urinary retention   . Prostate abscess   . ARF (acute renal failure) ([place]) 12/16/2016  . Anemia 12/16/2016  . Cocaine abuse ([place]) 08/16/2015  . Uncontrolled insulin dependent type 1 diabetes mellitus ([place]) 08/16/2015  . AKI (acute kidney injury) ([place]) 12/03/2014  . Elevated liver enzymes 07/26/2014  . Dental caries 07/23/2014  . Tinea pedis 07/23/2014  . DKA (diabetic ketoacidoses) ([place]) 11/23/2013  . Abscess  10/14/2012  . 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 Tobacco abuse 10/11/2012  . Essential hypertension 10/11/2012   Past Medical History:  Diagnosis Date  . Anemia   . Anxiety  Dx 2008  . Diabetes mellitus without complication ([place]) Dx 9528  . DKA (diabetic ketoacidoses) ([place]) 07/21/2017  . GERD (gastroesophageal reflux disease) Dx 2008  . Headache(784.0)   . Pneumonia   . Seizures ([place]) 2011    x 2 in lifetime. on Dilantin for a while.   . 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 Diabetes Father   . Heart disease Father   . Mental illness Sister   . Cancer Neg Hx     Past Surgical History:  Procedure Laterality Date  . I&D EXTREMITY Left 10/11/2012   Procedure: IRRIGATION AND DEBRIDEMENT ABSCESS FOREARM;  Surgeon: Linna Hoff, MD;  Location: [place];  Service: Orthopedics;  Laterality: Left;  . I&D EXTREMITY Left 10/12/2012   Procedure: IRRIGATION AND DEBRIDEMENT FOREARM;  Surgeon: Linna Hoff, MD;  Location: [place];  Service: Orthopedics;  Laterality: Left;  . I&D EXTREMITY Left 10/14/2012   Procedure: incision and drainage left forearm;  Surgeon: Linna Hoff, MD;  Location: [place];  Service: Orthopedics;  Laterality: Left;  . I&D EXTREMITY Left 10/16/2012   Procedure: IRRIGATION AND DEBRIDEMENT LEFT FOREARM;  Surgeon: Linna Hoff, MD;  Location: [place];  Service: Orthopedics;  Laterality: Left;  . I&D EXTREMITY Left 10/20/2012   Procedure: INCISION AND DRAINAGE AND DEBRIDEMENT LEFT  FOREARM;  Surgeon: Linna Hoff, MD;  Location: [place];  Service: Orthopedics;  Laterality: Left;  . INCISION AND DRAINAGE OF WOUND Bilateral 11/29/2017   Procedure: DEBRIDEMENT BILATERAL FEET, DEBRIDEMENT LEFT ANKLE, AND APPLY WOUND VAC;  Surgeon: [name], MD;  Location: [place];  Service: Orthopedics;  Laterality: Bilateral;  . IRRIGATION AND DEBRIDEMENT ABSCESS     Hx: of left arm abscess related to drug use   . TEE  WITHOUT CARDIOVERSION N/A 11/28/2017   Procedure: TRANSESOPHAGEAL ECHOCARDIOGRAM (TEE);  Surgeon: Sanda Klein, MD;  Location: [facility] ENDOSCOPY;  Service: Cardiovascular;  Laterality: N/A;   Social History   Occupational History  . Occupation: Event set up    Employer: [place]   . Occupation: Stencil for cars   Tobacco Use  . Smoking status: Current Every Day Smoker    Packs/day: 1.00    Types: Cigarettes  . Smokeless tobacco: Never Used  Substance and Sexual Activity  . Alcohol use: No    Alcohol/week: 0.0 standard drinks    Comment: occasional  . Drug use: Not Currently  Types: Cocaine, IV, Heroin    Comment: IVDU since Dad died in 2006/06/22 with 15 month period of sobriety 2018-2019, relapse Aug 2019.  Marland Kitchen Sexual activity: Yes    Partners: Female    Birth control/protection: None

## 2018-01-17 ENCOUNTER — Encounter (INDEPENDENT_AMBULATORY_CARE_PROVIDER_SITE_OTHER): Payer: Self-pay | Admitting: Physician Assistant

## 2018-01-21 ENCOUNTER — Ambulatory Visit: Payer: Self-pay | Admitting: Nurse Practitioner

## 2018-01-22 MED FILL — !NOVOLOG MIX 70/30 VIAL: 70-30/ML | 11 days supply | Qty: 10 | Fill #0

## 2018-01-30 ENCOUNTER — Ambulatory Visit (INDEPENDENT_AMBULATORY_CARE_PROVIDER_SITE_OTHER): Payer: MEDICAID | Admitting: Orthopedic Surgery

## 2018-02-04 MED FILL — NovoLOG 100 UNIT/ML SOLN: 100 | 28 days supply | Qty: 10 | Fill #1

## 2018-02-05 ENCOUNTER — Other Ambulatory Visit: Payer: Self-pay | Admitting: Family Medicine

## 2018-02-05 MED FILL — AMOXICILLIN 875 MG TABS: 875 | 30 days supply | Qty: 60 | Fill #2

## 2018-02-05 MED FILL — LEVOTHYROXINE 50 MCG TABLET: 50 | 30 days supply | Qty: 30 | Fill #2

## 2018-02-05 MED FILL — traZODone HCL 50 MG TABS: 50 | 30 days supply | Qty: 30 | Fill #0

## 2018-02-05 MED FILL — LISINOPRIL 10 MG TABS: 10 | 90 days supply | Qty: 90 | Fill #0

## 2018-02-05 MED FILL — GABAPENTIN 400 MG CAPSULE: 400 | 30 days supply | Qty: 90 | Fill #0

## 2018-02-07 MED FILL — NOVOLOG MIX 70/30 VIAL: (70-30) 100 | 11 days supply | Qty: 10 | Fill #0

## 2018-02-19 MED FILL — NOVOLOG MIX 70/30 VIAL: (70-30) 100 | 11 days supply | Qty: 10 | Fill #1

## 2018-02-21 ENCOUNTER — Ambulatory Visit: Payer: Self-pay | Admitting: Nurse Practitioner

## 2018-03-04 ENCOUNTER — Other Ambulatory Visit: Payer: Self-pay | Admitting: Family Medicine

## 2018-03-04 MED FILL — !NOVOLOG MIX 70/30 VIAL: 70-30/ML | 22 days supply | Qty: 20 | Fill #0

## 2018-03-04 MED FILL — LEVOTHYROXINE 50 MCG TABLET: 50 | 30 days supply | Qty: 30 | Fill #3

## 2018-03-05 MED FILL — AMOXICILLIN 875 MG TABS: 875 | 30 days supply | Qty: 60 | Fill #3

## 2018-03-10 ENCOUNTER — Other Ambulatory Visit: Payer: Self-pay

## 2018-03-10 DIAGNOSIS — E1165 Type 2 diabetes mellitus with hyperglycemia: Secondary | ICD-10-CM

## 2018-03-14 MED ORDER — GABAPENTIN 400 MG PO CAPS: 400.0000 mg | ORAL_CAPSULE | Freq: Three times a day (TID) | ORAL | 2 refills | Status: DC

## 2018-03-14 MED FILL — GABAPENTIN 400 MG CAPSULE: 400 | 30 days supply | Qty: 90 | Fill #0

## 2018-03-24 ENCOUNTER — Encounter: Payer: Self-pay | Admitting: Nurse Practitioner

## 2018-03-24 ENCOUNTER — Ambulatory Visit: Payer: Self-pay | Admitting: Nurse Practitioner

## 2018-03-24 ENCOUNTER — Ambulatory Visit: Payer: Self-pay | Attending: Nurse Practitioner | Admitting: Nurse Practitioner

## 2018-03-24 ENCOUNTER — Other Ambulatory Visit: Payer: Self-pay

## 2018-03-24 VITALS — BP 126/85 | HR 132 | Temp 99.4°F | Ht 69.0 in | Wt 180.4 lb

## 2018-03-24 DIAGNOSIS — F339 Major depressive disorder, recurrent, unspecified: Secondary | ICD-10-CM | POA: Insufficient documentation

## 2018-03-24 DIAGNOSIS — K219 Gastro-esophageal reflux disease without esophagitis: Secondary | ICD-10-CM | POA: Insufficient documentation

## 2018-03-24 DIAGNOSIS — E039 Hypothyroidism, unspecified: Secondary | ICD-10-CM | POA: Insufficient documentation

## 2018-03-24 DIAGNOSIS — Z8249 Family history of ischemic heart disease and other diseases of the circulatory system: Secondary | ICD-10-CM | POA: Insufficient documentation

## 2018-03-24 DIAGNOSIS — Z79899 Other long term (current) drug therapy: Secondary | ICD-10-CM | POA: Insufficient documentation

## 2018-03-24 DIAGNOSIS — E1065 Type 1 diabetes mellitus with hyperglycemia: Secondary | ICD-10-CM | POA: Insufficient documentation

## 2018-03-24 DIAGNOSIS — Z794 Long term (current) use of insulin: Secondary | ICD-10-CM | POA: Insufficient documentation

## 2018-03-24 DIAGNOSIS — I10 Essential (primary) hypertension: Secondary | ICD-10-CM | POA: Insufficient documentation

## 2018-03-24 DIAGNOSIS — Z79890 Hormone replacement therapy (postmenopausal): Secondary | ICD-10-CM | POA: Insufficient documentation

## 2018-03-24 DIAGNOSIS — Z79891 Long term (current) use of opiate analgesic: Secondary | ICD-10-CM | POA: Insufficient documentation

## 2018-03-24 DIAGNOSIS — F419 Anxiety disorder, unspecified: Secondary | ICD-10-CM | POA: Insufficient documentation

## 2018-03-24 DIAGNOSIS — F331 Major depressive disorder, recurrent, moderate: Secondary | ICD-10-CM

## 2018-03-24 DIAGNOSIS — Z833 Family history of diabetes mellitus: Secondary | ICD-10-CM | POA: Insufficient documentation

## 2018-03-24 LAB — POCT GLYCOSYLATED HEMOGLOBIN (HGB A1C): HbA1c POC (<> result, manual entry): >15 % (ref 4.0–5.6)

## 2018-03-24 LAB — POCT URINALYSIS DIP (MANUAL ENTRY)
BILIRUBIN UA: NEGATIVE
BILIRUBIN UA: NEGATIVE mg/dL
LEUKOCYTES UA: NEGATIVE
Nitrite, UA: NEGATIVE
PH UA: 6 (ref 5.0–8.0)
Protein Ur, POC: >=300 mg/dL — AB
Spec Grav, UA: 1.01 (ref 1.010–1.025)
Urobilinogen, UA: 0.2 E.U./dL

## 2018-03-24 LAB — GLUCOSE, POCT (MANUAL RESULT ENTRY): POC GLUCOSE: 591 mg/dL — AB (ref 70–99)

## 2018-03-24 MED ORDER — LISINOPRIL 10 MG PO TABS: 10.0000 mg | ORAL_TABLET | Freq: Every day | ORAL | 0 refills | Status: DC

## 2018-03-24 MED ORDER — GABAPENTIN 400 MG PO CAPS: 400.0000 mg | ORAL_CAPSULE | Freq: Three times a day (TID) | ORAL | 2 refills | Status: DC

## 2018-03-24 MED ORDER — INSULIN NPH ISOPHANE & REGULAR (70-30) 100 UNIT/ML ~~LOC~~ SUSP: 45.0000 [IU] | Freq: Two times a day (BID) | SUBCUTANEOUS | 11 refills | Status: DC

## 2018-03-24 MED ORDER — SERTRALINE HCL 50 MG PO TABS: 50.0000 mg | ORAL_TABLET | Freq: Every day | ORAL | 3 refills | Status: DC

## 2018-03-24 MED ORDER — OMEPRAZOLE 20 MG PO CPDR: 20.0000 mg | DELAYED_RELEASE_CAPSULE | Freq: Every day | ORAL | 3 refills | Status: DC

## 2018-03-24 MED ORDER — INSULIN ASPART 100 UNIT/ML ~~LOC~~ SOLN: 10.0000 [IU] | Freq: Three times a day (TID) | SUBCUTANEOUS | 99 refills | Status: DC

## 2018-03-24 MED ORDER — INSULIN GLARGINE 100 UNIT/ML SOLOSTAR PEN: 40.0000 [IU] | PEN_INJECTOR | Freq: Every day | SUBCUTANEOUS | 99 refills | Status: DC

## 2018-03-24 MED ORDER — GLUCOSE BLOOD VI STRP: ORAL_STRIP | 12 refills | Status: DC

## 2018-03-24 MED FILL — TRUE METRIX TEST STRIP: 30 days supply | Qty: 100 | Fill #0

## 2018-03-24 MED FILL — !LANTUS SOLOSTAR 100UNITS/M: 100 | 29 days supply | Qty: 12 | Fill #0

## 2018-03-24 MED FILL — SERTRALINE HCL 50 MG TABS: 50 | 30 days supply | Qty: 30 | Fill #0

## 2018-03-24 MED FILL — !NOVOLOG 100UNITS/ML VIAL: 100/ML | 28 days supply | Qty: 10 | Fill #0

## 2018-03-24 NOTE — Patient Instructions (Signed)
Behavioral Health Resources:  ? ?What if I or someone I know is in crisis? ? ?If you are thinking about harming yourself or having thoughts of suicide, or if you know someone who is, seek help right away. ? ?Call your doctor or mental health care provider. ? ?Call 911 or go to a hospital emergency room to get immediate help, or ask a friend or family member to help you do these things. ? ?Call the [place] National Suicide Prevention Lifeline?s toll-free, 24-hour hotline at 1-800-273-TALK (1-800-273-8255) or TTY: 1-800-799-4 TTY (1-800-799-4889) to talk to a trained counselor. ? ?If you are in crisis, make sure you are not left alone.  ? ?If someone else is in crisis, make sure he or she is not left alone ? ? ?24 Hour Availability ? ?[facility]  ?[address], [place], [place] [address]  ?336-832-9700 or 1-800-711-2635 ? ?[facility] Crisis Line ?(Domestic Violence, Rape & Victim Assistance ?336-273-7273 ? ?[facility]  ?[address] [place], [place]  [address]               1-855-788-8787 or 336-676-6840 ? ?[facility] [place] [facility]    ?(ONLY from 8am-4pm)    ?336-899-1505 ? ?[facility] (24/7)   ?1-877-626-1772 ? ?[place] National Suicide Hotline   ?1-800-273-8255 (TALK) ? ?Support from local police to aid getting patient to hospital (http://www.[place]-.gov/index.aspx?page=2797) ? ? ?      ?ONGOING BEHAVIORAL HEALTH SUPPORT FOR UNINSURED and UNDERINSURED:  ?[facility]  ?336-676-6840  ?[address]  ?Walk-in first time, Monday-Friday, 8:30am-5:00pm  ?*Bring snack, drink, something to do, long wait at first visit, they do have pharmacy for behavioral health medications/ Bring own interpreter at first visit, if needed ?[facility]  ?336-387-6161  ?[name]  ?Walk-in Monday-Friday, 8:30am-12pm & 1-2:30pm  ?*pacientes que hablen espanol, favor comunicarse con el Sr.  M[place], extension 2244 o la [address], extension 3331 para hacer una cita  ?[facility]:  ?336-429-5600 or kellinfoundation@gmail.com  ?[address], [address]  ?Call or email, may self-refer  ?* uninsured/underinsured, 19-64yo, have both mental health and substance use challenges  ?[facility]:  ?Phone ([phone]; Fax [phone]  ?*Call to schedule an appointment  ?3rd Floor located @?[address], [address]  ?Mon-Thursday: 8:30am-8:00pm Friday: 8:30am-7:00pm  ?* Be sure to park in a space labeled ?Psychology Department,? located to the right of the main door of the building. Enter the main doors facing the parking lot and take the elevator or stairs to the 3rd Floor.  ?[facility]:  ?336-832-9700 or  ?1-800-711-2635 (24/hour helpline)  ?[address]  ?Call to make appointment, tends to be a long wait to begin services, depending on insurance  ?[facility]  ?(336) [phone] ??  ?*Call to schedule an appointment  ?[name]  ?Monday-Friday, 8:00am-5:00pm  ?[facility]  ?([phone]  ?[address], [place]  ?Monday-Friday, walk-in 8am-3pm  ?First appointment is assessment, then will make appointment for psychiatry   ?  ?

## 2018-03-24 NOTE — Progress Notes (Signed)
Assessment & Plan:  Star was seen today for diabetes.  Diagnoses and all orders for this visit:  Type 1 diabetes mellitus with hyperglycemia ([name]) -     Glucose (CBG) -     Cancel: Hemoglobin A1c -     CMP14+EGFR -     CBC -     POCT urinalysis dipstick -     gabapentin (NEURONTIN) 400 MG capsule; Take 1 capsule (400 mg total) by mouth 3 (three) times daily. -     glucose blood test strip; Use as instructed -     POCT glycosylated hemoglobin (Hb A1C) -     insulin NPH-regular Human (NOVOLIN 70/30) (70-30) 100 UNIT/ML injection; Inject 45 Units into the skin 2 (two) times daily with a meal. -     insulin aspart (NOVOLOG) 100 UNIT/ML injection; Inject 10 Units into the skin 3 (three) times daily before meals.  Essential hypertension -     lisinopril (PRINIVIL,ZESTRIL) 10 MG tablet; Take 1 tablet (10 mg total) by mouth daily. Continue all antihypertensives as prescribed.  Remember to bring in your blood pressure log with you for your follow up appointment.  DASH/Mediterranean Diets are healthier choices for HTN.    Acquired hypothyroidism -     TSH  Gastroesophageal reflux disease without esophagitis -     omeprazole (PRILOSEC) 20 MG capsule; Take 1 capsule (20 mg total) by mouth daily. INSTRUCTIONS: Avoid GERD Triggers: acidic, spicy or fried foods, caffeine, coffee, sodas,  alcohol and chocolate.   Moderate episode of recurrent major depressive disorder ([name]) -     sertraline (ZOLOFT) 50 MG tablet; Take 1 tablet (50 mg total) by mouth daily.  Other orders Unable to obtain blood sample from patient due to history of chronic IVDA and poor access sites. He was sent [facility] for his blood draw today     Patient has been counseled on age-appropriate routine health concerns for screening and prevention. These are reviewed and up-to-date. Referrals have been placed accordingly. Immunizations are up-to-date or declined.    Subjective:   Chief Complaint  Patient presents with    . Diabetes   HPI Luke [name] 38 y.o. male presents to office today for DM 2 follow up. 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 He is currently being followed by orthopedics s/p extensive wound debridement and wound vac 11-2017. Last office visit with ortho was 01-16-2018 and he was instructed to follow up in November for which he was a no show to that appointment.   Type 2 Diabetes Mellitus Disease course has been worsening. A1c >15. He is not medication compliant. States he was unaware he should be taking 45units of Novolog 70/30 twice a day.  There are hyperglycemic symptoms. There are diabetic complications. Risk factors for coronary artery disease include family history, dyslipidemia, diabetes mellitus, hypertension, sedentary lifestyle and stress. Current diabetic treatment includes Novolog 70/30 45 units BID and novolog SSI. Patient endorses medication  compliance with treatment all of the time and monitors blood glucose at home multiple times per day.   Home blood glucose trend : (FBS 300-500s mg/dl)  Weight is  stable. Patient has not seen a dietician. Patient is not compliant with exercise.   An ACE inhibitor/angiotensin II receptor blocker is being taken. Patient does not see a podiatrist. Eye exam is not current.  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 Lab Results  Component Value Date   HGBA1C >15 03/24/2018   Hypothyroidism Taking synthroid as prescribed. Needs TSH however difficult blood draw here in the office and patient has been instructed to go to [place] to have his blood work obtained.  He endorses medication compliance taking levothyroxine 50 mcg daily as prescribed.  Symptoms  consist of anxiousness, palpitations. Symptoms have present for several weeks. The symptoms are moderate.  The problem has been unchanged.  Previous thyroid studies include TSH. The hypothyroidism is due to hypothyroidism. Lab Results  Component Value Date   TSH 6.831 (H) 10/21/2017    Essential Hypertension Blood pressure is well controlled however he is tachycardic today but denies chest pain, shortness of breath, palpitations, lightheadedness, dizziness, headaches or BLE edema. .  BP Readings from Last 3 Encounters:  03/24/18 126/85  12/12/17 132/71  11/20/17 136/85    Depression and Anxiety He endorses panic attacks with symptoms consisting of chest pain shortness of breath and palpitations. Onset a few months ago. He states when his dad passed 2008 he was placed on numerous antidepressants which did not work for him however he can not recall any names of the antidepressants he was taking in the past. Will try zoloft today. He currently denies any thoughts of self harm.  Depression screen [facility] 2/9 03/24/2018 10/21/2017 09/30/2017 05/15/2017 01/28/2017  Decreased Interest 2 0 0 3 1  Down, Depressed, Hopeless 2 0 '1 3 1  '$ PHQ - 2 Score 4 0 '1 6 2  '$ Altered sleeping '3 1 2 2 2  '$ Tired, decreased energy '3 1 2 '$ 0 3  Change in appetite 0 1 0 1 0  Feeling bad or failure about yourself  2 0 0 0 1  Trouble concentrating 0 0 0 0 0  Moving slowly or fidgety/restless 0 0 0 0 0  Suicidal thoughts 0 0 0 0 0  PHQ-9 Score '12 3 5 9 8   '$ GAD 7 : Generalized Anxiety Score 03/24/2018 10/21/2017 09/30/2017 05/15/2017  Nervous, Anxious, on Edge 3 0 0 1  Control/stop worrying 2 0 0 1  Worry too much - different things 2 0 0 1  Trouble relaxing '3 1 1 1  '$ Restless 2 0 0 0  Easily annoyed or irritable 1 0 1 1  Afraid - awful might happen 2 0 1 0  Total GAD 7 Score '15 1 3 5    '$ Review of Systems  Constitutional: Negative for fever, malaise/fatigue and weight loss.  HENT: Negative.  Negative for nosebleeds.   Eyes:  Negative.  Negative for blurred vision, double vision and photophobia.  Respiratory: Negative.  Negative for cough and shortness of breath.   Cardiovascular: Negative.  Negative for chest pain, palpitations and leg swelling.  Gastrointestinal: Positive for heartburn. Negative for nausea and vomiting.  Musculoskeletal: Negative.  Negative for myalgias.  Neurological: Positive for tingling and sensory change. Negative for dizziness, focal weakness, seizures and headaches.       Peripheral neuropathy  Psychiatric/Behavioral: Positive for depression and substance abuse. Negative for hallucinations, memory loss and suicidal ideas. The patient is nervous/anxious and has insomnia.     Past Medical History:  Diagnosis Date  . Anemia   . Anxiety  Dx 2008  . Diabetes mellitus without complication ([place]) Dx 2458  .  DKA (diabetic ketoacidoses) ([place]) 07/21/2017  . GERD (gastroesophageal reflux disease) Dx 2008  . Headache(784.0)   . Pneumonia   . Seizures ([place]) 2011    x 2 in lifetime. on Dilantin for a while.   . 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 I&D EXTREMITY Left 10/11/2012   Procedure: IRRIGATION AND DEBRIDEMENT ABSCESS FOREARM;  Surgeon: Linna Hoff, MD;  Location: [place];  Service: Orthopedics;  Laterality: Left;  . I&D EXTREMITY Left 10/12/2012   Procedure: IRRIGATION AND DEBRIDEMENT FOREARM;  Surgeon: Linna Hoff, MD;  Location: [place];  Service: Orthopedics;  Laterality: Left;  . I&D EXTREMITY Left 10/14/2012   Procedure: incision and drainage left forearm;  Surgeon: Linna Hoff, MD;  Location: [place];  Service: Orthopedics;  Laterality: Left;  . I&D EXTREMITY Left 10/16/2012   Procedure: IRRIGATION AND DEBRIDEMENT LEFT FOREARM;  Surgeon: Linna Hoff, MD;  Location: [name];  Service: Orthopedics;  Laterality: Left;  . I&D EXTREMITY Left 10/20/2012   Procedure: INCISION AND DRAINAGE AND DEBRIDEMENT LEFT   FOREARM;  Surgeon: Linna Hoff, MD;  Location: [place];  Service: Orthopedics;  Laterality: Left;  . INCISION AND DRAINAGE OF WOUND Bilateral 11/29/2017   Procedure: DEBRIDEMENT BILATERAL FEET, DEBRIDEMENT LEFT ANKLE, AND APPLY WOUND VAC;  Surgeon: [name], MD;  Location: [place];  Service: Orthopedics;  Laterality: Bilateral;  . IRRIGATION AND DEBRIDEMENT ABSCESS     Hx: of left arm abscess related to drug use   . TEE WITHOUT CARDIOVERSION N/A 11/28/2017   Procedure: TRANSESOPHAGEAL ECHOCARDIOGRAM (TEE);  Surgeon: Sanda Klein, MD;  [facility];  Service: Cardiovascular;  Laterality: N/A;    Family History  Problem Relation Age of Onset  . Diabetes Father   . Heart disease Father   . Mental illness Sister   . Cancer Neg Hx     Social History Reviewed with no changes to be made today.   Outpatient Medications Prior to Visit  Medication Sig Dispense Refill  . acetaminophen (TYLENOL) 325 MG tablet Take 2 tablets (650 mg total) by mouth every 6 (six) hours as needed for mild pain (or Fever >/= 101).    Marland Kitchen acetaminophen (TYLENOL) 500 MG tablet Take 2,000 mg by mouth every 6 (six) hours as needed for mild pain.    Marland Kitchen amoxicillin (AMOXIL) 875 MG tablet Take 1 tablet (875 mg total) by mouth 2 (two) times daily. 120 tablet 2  . Blood Glucose Monitoring Suppl (TRUE METRIX METER) DEVI 1 kit by Does not apply route as directed. Use as directed 1 Device 0  . Insulin Syringe-Needle U-100 (TRUEPLUS INSULIN SYRINGE) 31G X 5/16" 0.5 ML MISC USE AS DIRECTED. 100 each 6  . levothyroxine (SYNTHROID, LEVOTHROID) 50 MCG tablet Take 1 tablet (50 mcg total) by mouth daily. 30 tablet 0  . silver sulfADIAZINE (SILVADENE) 1 % cream Apply topically daily. 50 g 0  . silver sulfADIAZINE (SILVADENE) 1 % cream Apply 1 application topically daily. Apply to affected area daily plus dry dressing 400 g 0  . traMADol (ULTRAM) 50 MG tablet Take 1 tablet (50 mg total) by mouth 2 (two) times daily. 14 tablet 0   . TRUEPLUS LANCETS 28G MISC Use as directed 100 each 12  . gabapentin (NEURONTIN) 400 MG capsule Take 1 capsule (400 mg total) by mouth 3 (three) times daily. 90 capsule 2  . glucose blood test  strip Use as instructed 100 each 12  . insulin aspart (NOVOLOG) 100 UNIT/ML injection Inject 0-15 Units into the skin 3 (three) times daily with meals. 10 mL 11  . lisinopril (PRINIVIL,ZESTRIL) 10 MG tablet Take 1 tablet (10 mg total) by mouth daily. 30 tablet 0  . NOVOLOG MIX 70/30 (70-30) 100 UNIT/ML injection INJECT 45 UNITS INTO THE SKIN 2 TIMES DAILY WITH A MEAL. 10 mL 1  . ranitidine (ZANTAC) 150 MG tablet Take 1 tablet (150 mg total) by mouth 2 (two) times daily. 60 tablet 0  . traZODone (DESYREL) 50 MG tablet Take 1 tablet (50 mg total) by mouth at bedtime. (Patient not taking: Reported on 03/24/2018) 30 tablet 0   No facility-administered medications prior to visit.     No Known Allergies     Objective:    BP 126/85   Pulse (!) 132   Temp 99.4 F (37.4 C) (Oral)   Ht '5\' 9"'$  (1.753 m)   Wt 180 lb 6.4 oz (81.8 kg)   SpO2 94%   BMI 26.64 kg/m  Wt Readings from Last 3 Encounters:  03/24/18 180 lb 6.4 oz (81.8 kg)  01/16/18 191 lb 6.4 oz (86.8 kg)  01/01/18 191 lb 6.4 oz (86.8 kg)    Physical Exam Vitals signs and nursing note reviewed.  Constitutional:      Appearance: He is well-developed.  HENT:     Head: Normocephalic and atraumatic.  Neck:     Musculoskeletal: Normal range of motion.  Cardiovascular:     Rate and Rhythm: Regular rhythm. Tachycardia present.     Heart sounds: Normal heart sounds. No murmur. No friction rub. No gallop.   Pulmonary:     Effort: Pulmonary effort is normal. No tachypnea or respiratory distress.     Breath sounds: Normal breath sounds. No decreased breath sounds, wheezing, rhonchi or rales.  Chest:     Chest wall: No tenderness.  Abdominal:     General: Bowel sounds are normal.     Palpations: Abdomen is soft.  Musculoskeletal: Normal range  of motion.  Skin:    General: Skin is warm and dry.  Neurological:     Mental Status: He is alert and oriented to person, place, and time.     Coordination: Coordination normal.  Psychiatric:        Behavior: Behavior normal. Behavior is cooperative.        Thought Content: Thought content normal.        Judgment: Judgment normal.        Patient has been counseled extensively about nutrition and exercise as well as the importance of adherence with medications and regular follow-up. The patient was given clear instructions to go to ER or return to medical center if symptoms don't improve, worsen or new problems develop. The patient verbalized understanding.   Follow-up: Return in about 3 weeks (around 04/14/2018) for Bring meter; see luke.   Gildardo Pounds, FNP-BC [facility] and [facility], [place]   03/24/2018, 10:47 AM

## 2018-04-09 MED FILL — LEVOTHYROXINE 50 MCG TABLET: 50 | 30 days supply | Qty: 30 | Fill #4

## 2018-04-09 MED FILL — AMOXICILLIN 875 MG TABS: 875 | 30 days supply | Qty: 60 | Fill #4

## 2018-04-09 MED FILL — GABAPENTIN 400 MG CAPSULE: 400 | 30 days supply | Qty: 90 | Fill #1

## 2018-04-15 ENCOUNTER — Ambulatory Visit: Payer: Self-pay | Attending: Nurse Practitioner | Admitting: Pharmacist

## 2018-04-15 DIAGNOSIS — E1040 Type 1 diabetes mellitus with diabetic neuropathy, unspecified: Secondary | ICD-10-CM | POA: Insufficient documentation

## 2018-04-15 DIAGNOSIS — E1065 Type 1 diabetes mellitus with hyperglycemia: Secondary | ICD-10-CM | POA: Insufficient documentation

## 2018-04-15 DIAGNOSIS — Z794 Long term (current) use of insulin: Secondary | ICD-10-CM | POA: Insufficient documentation

## 2018-04-15 DIAGNOSIS — Z9111 Patient's noncompliance with dietary regimen: Secondary | ICD-10-CM | POA: Insufficient documentation

## 2018-04-15 LAB — GLUCOSE, POCT (MANUAL RESULT ENTRY): POC GLUCOSE: 196 mg/dL — AB (ref 70–99)

## 2018-04-15 MED ORDER — INSULIN GLARGINE 100 UNIT/ML SOLOSTAR PEN: 40.0000 [IU] | PEN_INJECTOR | Freq: Every day | SUBCUTANEOUS | 0 refills | Status: DC

## 2018-04-15 NOTE — Patient Instructions (Addendum)
Thank you for coming to see me today. Please do the following:  1. Continue Lantus 40 units daily. Continue sliding scale Novolog.  2. Continue checking blood sugars at home. Check fasting, before meals, and before bedtime. 3. Continue making the lifestyle changes we've discussed together during our visit. Diet and exercise play a significant role in improving your blood sugars.  4. Follow-up with me in 1 month.   Hypoglycemia or low blood sugar:   Low blood sugar can happen quickly and may become an emergency if not treated right away.   While this shouldn't happen often, it can be brought upon if you skip a meal or do not eat enough. Also, if your insulin or other diabetes medications are dosed too high, this can cause your blood sugar to go to low.   Warning signs of low blood sugar include: 1. Feeling shaky or dizzy 2. Feeling weak or tired  3. Excessive hunger 4. Feeling anxious or upset  5. Sweating even when you aren't exercising  What to do if I experience low blood sugar? 1. Check your blood sugar with your meter. If lower than 70, proceed to step 2.  2. Treat with 3-4 glucose tablets or 3 packets of regular sugar. If these aren't around, you can try hard candy. Yet another option would be to drink 4 ounces of fruit juice or 6 ounces of REGULAR soda.  3. Re-check your sugar in 15 minutes. If it is still below 70, do what you did in step 2 again. If has come back up, go ahead and eat a snack or small meal at this time.

## 2018-04-15 NOTE — Progress Notes (Signed)
    S:     No chief complaint on file.  Patient arrives in good spirits. Presents for diabetes management at the request of Zelda. Patient was referred on 03/24/2018. Pt with PMH significant for DM type 1. Started on insulin approximately15 years ago.  Family/Social History:  - FHx: DM, HD (father) - Tobacco: 1 PPD - Alcohol: occasionally (2 times a month) - Polysubstance abuse states last used 1 month ago.  Insurance coverage/medication affordability:  - Family planning Medicaid. - No rx medication coveragt   Patient denies adherence with medications.  -Novolin 70/30 45 units BID (not using - injecting 40 units qhs of Lantus instead) -Novolog sliding scale  Patient has had 2 readings below 60 but states did not eat.  Patient reported dietary habits:  - Eats 2 meals/day - Non compliant with a diabetic diet.  - Denies eating sweets but does drink regular soda.  - Reports mom is a dietician and his wife is a LPN - they help him plan his diet   Patient denies polyuria, polydipsia.  Patient reports neuropathy. Patient denies visual changes. Reports he does check his feet daily.  O:  POCT:196 Home sugar averages: 7 day 216, 14 day 299   Lab Results  Component Value Date   HGBA1C >15 03/24/2018   There were no vitals filed for this visit.  Lipid Panel     Component Value Date/Time   CHOL 237 (H) 10/21/2017 1508   CHOL 150 04/11/2014 0054   TRIG 224 (H) 10/21/2017 1508   TRIG 121 04/11/2014 0054   HDL 36 (L) 10/21/2017 1508   HDL 46 04/11/2014 0054   CHOLHDL 6.6 10/21/2017 1508   VLDL 45 (H) 10/21/2017 1508   VLDL 24 04/11/2014 0054   LDLCALC 156 (H) 10/21/2017 1508   LDLCALC 80 04/11/2014 0054   Clinical ASCVD: No  The ASCVD Risk score (Goff DC Jr., et al., 2013) failed to calculate for the following reasons:   The 2013 ASCVD risk score is only valid for ages 61 to 76   A/P: Diabetes longstanding currently uncontrolled. Patient is able to verbalize appropriate  hypoglycemia management plan. Patient is taking Lantus w/ Novolog sliding scale and reports adherence to this since seeing Zelda on 03/24/2018. Control is suboptimal due to non-compliance with diet.   Pt would benefit from Endo referral. Unfortunately, he reports that he was not approved for the [facility] discount. Provided him with additional resources. His blood sugars have improved - it is unclear how he obtained a rx for Lantus but I will send in for Lantus 40 units. I emphasized that we need consistency with his eating habits before we can adjust his insulin.   -Continued Lantus 40 units, Novolog sliding scale -Extensively discussed pathophysiology of DM, recommended lifestyle interventions, dietary effects on glycemic control -Counseled on s/sx of and management of hypoglycemia -Microalb; handwritten orders for hospital lab -HM: tetanus, PNA vaccines provided via patient assistance - pt deferred flu -Next A1C anticipated 06/2018.   Written patient instructions provided.  Total time in face to face counseling 30 minutes.   Follow up Pharmacist Clinic Visit in 1 month.     Patient seen with: Beckey Rutter, PharmD Candidate  [employer]  Class of 2022  Benard Halsted, PharmD, [place] [phone]

## 2018-04-16 ENCOUNTER — Other Ambulatory Visit: Payer: Self-pay | Admitting: Family Medicine

## 2018-04-16 ENCOUNTER — Encounter: Payer: Self-pay | Admitting: Pharmacist

## 2018-04-16 DIAGNOSIS — IMO0002 Reserved for concepts with insufficient information to code with codable children: Secondary | ICD-10-CM

## 2018-04-16 DIAGNOSIS — E1065 Type 1 diabetes mellitus with hyperglycemia: Secondary | ICD-10-CM

## 2018-04-16 LAB — MICROALBUMIN / CREATININE URINE RATIO
Creatinine, Urine: 199.2 mg/dL
Microalb/Creat Ratio: 1553 mg/g creat — ABNORMAL HIGH (ref 0–29)
Microalbumin, Urine: 3092.9 ug/mL

## 2018-04-18 MED FILL — !LANTUS SOLOSTAR 100UNITS/M: 100 | 30 days supply | Qty: 12 | Fill #0

## 2018-04-23 ENCOUNTER — Other Ambulatory Visit: Payer: Self-pay | Admitting: Nurse Practitioner

## 2018-04-23 DIAGNOSIS — I10 Essential (primary) hypertension: Secondary | ICD-10-CM

## 2018-04-23 MED ORDER — LISINOPRIL 20 MG PO TABS: 20.0000 mg | ORAL_TABLET | Freq: Every day | ORAL | 1 refills | Status: DC

## 2018-04-24 MED FILL — LISINOPRIL 20 MG TAB: 20 | 30 days supply | Qty: 30 | Fill #0

## 2018-05-07 MED FILL — GABAPENTIN 400 MG CAPSULE: 400 | 30 days supply | Qty: 90 | Fill #2

## 2018-05-09 MED FILL — SERTRALINE HCL 50 MG TABS: 50 | 30 days supply | Qty: 30 | Fill #1

## 2018-05-09 MED FILL — AMOXICILLIN 875 MG TABS: 875 | 30 days supply | Qty: 60 | Fill #5

## 2018-05-09 MED FILL — !LANTUS SOLOSTAR 100UNITS/M: 100 | 29 days supply | Qty: 12 | Fill #1

## 2018-05-09 MED FILL — LEVOTHYROXINE 50 MCG TABLET: 50 | 30 days supply | Qty: 30 | Fill #5

## 2018-05-13 MED FILL — TRUE METRIX TEST STRIP: 30 days supply | Qty: 100 | Fill #1

## 2018-05-16 ENCOUNTER — Ambulatory Visit: Payer: Medicaid Other | Admitting: Pharmacist

## 2018-06-06 MED FILL — SERTRALINE HCL 50 MG TABS: 50 | 30 days supply | Qty: 30 | Fill #2

## 2018-06-06 MED FILL — GABAPENTIN 400 MG CAPSULE: 400 | 30 days supply | Qty: 90 | Fill #0

## 2018-06-06 MED FILL — !LANTUS SOLOSTAR 100UNITS/M: 100 | 29 days supply | Qty: 12 | Fill #2

## 2018-06-06 MED FILL — LEVOTHYROXINE 50 MCG TABLET: 50 | 30 days supply | Qty: 30 | Fill #6

## 2018-06-24 ENCOUNTER — Other Ambulatory Visit: Payer: Self-pay

## 2018-06-24 ENCOUNTER — Encounter: Payer: Self-pay | Admitting: Nurse Practitioner

## 2018-06-24 ENCOUNTER — Ambulatory Visit: Payer: Medicaid Other | Admitting: Nurse Practitioner

## 2018-06-24 ENCOUNTER — Ambulatory Visit: Payer: Self-pay | Attending: Nurse Practitioner | Admitting: Nurse Practitioner

## 2018-06-24 VITALS — BP 156/107 | HR 101 | Temp 99.2°F | Ht 69.0 in | Wt 187.2 lb

## 2018-06-24 DIAGNOSIS — F17200 Nicotine dependence, unspecified, uncomplicated: Secondary | ICD-10-CM

## 2018-06-24 DIAGNOSIS — E039 Hypothyroidism, unspecified: Secondary | ICD-10-CM

## 2018-06-24 DIAGNOSIS — IMO0002 Reserved for concepts with insufficient information to code with codable children: Secondary | ICD-10-CM

## 2018-06-24 DIAGNOSIS — I10 Essential (primary) hypertension: Secondary | ICD-10-CM

## 2018-06-24 DIAGNOSIS — E1065 Type 1 diabetes mellitus with hyperglycemia: Secondary | ICD-10-CM

## 2018-06-24 LAB — POCT GLYCOSYLATED HEMOGLOBIN (HGB A1C): Hemoglobin A1C: 12.6 % — AB (ref 4.0–5.6)

## 2018-06-24 LAB — GLUCOSE, POCT (MANUAL RESULT ENTRY): POC Glucose: 367 mg/dl — AB (ref 70–99)

## 2018-06-24 MED ORDER — LISINOPRIL 20 MG PO TABS: 20.0000 mg | ORAL_TABLET | Freq: Every day | ORAL | 1 refills | Status: DC

## 2018-06-24 MED ORDER — INSULIN ASPART 100 UNIT/ML ~~LOC~~ SOLN: 15.0000 [IU] | Freq: Three times a day (TID) | SUBCUTANEOUS | 99 refills | Status: DC

## 2018-06-24 MED ORDER — BUPROPION HCL ER (XL) 150 MG PO TB24: ORAL_TABLET | ORAL | 1 refills | Status: DC

## 2018-06-24 MED ORDER — INSULIN GLARGINE 100 UNIT/ML SOLOSTAR PEN: 40.0000 [IU] | PEN_INJECTOR | Freq: Two times a day (BID) | SUBCUTANEOUS | 3 refills | Status: DC

## 2018-06-24 MED ORDER — "INSULIN SYRINGE-NEEDLE U-100 31G X 5/16"" 0.5 ML MISC": 6 refills | Status: DC

## 2018-06-24 MED ORDER — LEVOTHYROXINE SODIUM 50 MCG PO TABS: 50.0000 ug | ORAL_TABLET | Freq: Every day | ORAL | 0 refills | Status: DC

## 2018-06-24 MED FILL — !NOVOLOG 100UNITS/ML VIAL: 100/ML | 22 days supply | Qty: 10 | Fill #0

## 2018-06-24 MED FILL — !LANTUS SOLOSTAR 100UNITS/M: 100 | 33 days supply | Qty: 27 | Fill #0

## 2018-06-24 NOTE — Patient Instructions (Signed)
Note: Therapy should begin at least 1 week before target quit date. Target quit dates are generally in the second week of treatment. If patient successfully quits smoking after 7 to 12 weeks but is not ready to discontinue treatment, may consider ongoing maintenance therapy based on individual patient risk:benefit. Efficacy of maintenance therapy (300 mg daily) has been demonstrated for up to 1 year. Conversely, if significant progress has not been made by the seventh week of therapy, success is unlikely; consider combination therapy, or discontinuation and use of an alternative agent.   If any questions call me at [phone]

## 2018-06-24 NOTE — Progress Notes (Signed)
Assessment & Plan:  Luke [name] was seen today for follow-up.  Diagnoses and all orders for this visit:  Uncontrolled insulin dependent type 1 diabetes mellitus ([name]) -     Glucose (CBG) -     HgB A1c -     Insulin Glargine (LANTUS SOLOSTAR) 100 UNIT/ML Solostar Pen; Inject 40 Units into the skin 2 (two) times daily. -     insulin aspart (NOVOLOG) 100 UNIT/ML injection; Inject 15 Units into the skin 3 (three) times daily before meals for 30 days. -     Insulin Syringe-Needle U-100 (TRUEPLUS INSULIN SYRINGE) 31G X 5/16" 0.5 ML MISC; USE AS DIRECTED. -     CBC -     CMP14+EGFR -     TSH -     Lipid panel Diabetes is poorly controlled. Advised patient to keep a fasting blood sugar log fast, 2 hours post lunch and bedtime which will be reviewed at the next office visit.  Essential hypertension -     lisinopril (PRINIVIL,ZESTRIL) 20 MG tablet; Take 1 tablet (20 mg total) by mouth daily. Continue all antihypertensives as prescribed.  Remember to bring in your blood pressure log with you for your follow up appointment.  DASH/Mediterranean Diets are healthier choices for HTN.    Tobacco dependence -     buPROPion (WELLBUTRIN XL) 150 MG 24 hr tablet; Take (one) tablet by mouth once daily for 3 days; increase to 150 mg twice a day. If unable to tolerate 2 tabs; decrease back to 1 tab a day Luke [name] was counseled on the dangers of tobacco use, and was advised to quit. Reviewed strategies to maximize success, including removing cigarettes and smoking materials from environment, stress management and support of family/friends as well as pharmacological alternatives including: Wellbutrin, Chantix, Nicotine patch, Nicotine gum or lozenges. Smoking cessation support: smoking cessation hotline: 1-800-QUIT-NOW.  Smoking cessation classes are also available through [facility]. Call [phone] or visit our website at [email or web address]   A total of 5 minutes was spent on counseling  for smoking cessation and Luke [name] is ready to quit.  Will start Wellbutrin today.    Hypothyroidism, unspecified type -     levothyroxine (SYNTHROID, LEVOTHROID) 50 MCG tablet; Take 1 tablet (50 mcg total) by mouth daily. Lab Results  Component Value Date   TSH 6.831 (H) 10/21/2017      Patient has been counseled on age-appropriate routine health concerns for screening and prevention. These are reviewed and up-to-date. Referrals have been placed accordingly. Immunizations are up-to-date or declined.    Subjective:   Chief Complaint  Patient presents with  . Follow-up    Pt. stated he still have an open sore on his left foot and request if he can get antiobiotic.   HPI Luke [name] 38 y.o. male presents to office today for follow up visit. He has a history of poorly controlled DM Type 1.  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 He required wound debridement, wound vac and PICC line. Both of which have now been dc'd.  He is still using illicit substances per his report. It has been very difficult to obtain blood work from him here in the office due to poor site access. I have sent him to an outside laboratory however he has not had his blood work drawn.   DM TYPE 1 Chronic and poorly controlled. Blood sugars are running high per patient.  Upwards 300-400s. He endorses medication compliance however several of his medications show that they are expired and patient has not picked up from the pharmacy. He would benefit from seeing an endocrinologist as he has type 1 diabetes however he reports his wife cannot afford to cover him under her work insurance and he states he was not approved for any financial assistance here at the community clinic. I have increased lantus to 40units BID and novolog from 10 units TID to 15 units TID.   Lab Results  Component Value Date   HGBA1C 12.6 (A) 06/24/2018    Essential Hypertension  Blood pressure is poorly controlled today. Current medications include lisinopril 49m daily. He is not monitoring his blood pressure at home.  BP Readings from Last 3 Encounters:  06/24/18 (!) 156/107  03/24/18 126/85  12/12/17 132/71    Review of Systems  Constitutional: Negative for fever, malaise/fatigue and weight loss.  HENT: Negative.  Negative for nosebleeds.   Eyes: Negative.  Negative for blurred vision, double vision and photophobia.  Respiratory: Negative.  Negative for cough and shortness of breath.   Cardiovascular: Negative.  Negative for chest pain, palpitations and leg swelling.  Gastrointestinal: Negative.  Negative for heartburn, nausea and vomiting.  Musculoskeletal: Negative.  Negative for myalgias.  Neurological: Negative.  Negative for dizziness, focal weakness, seizures and headaches.  Psychiatric/Behavioral: Negative.  Negative for suicidal ideas.    Past Medical History:  Diagnosis Date  . Anemia   . Anxiety  Dx 2008  . [place] [address] . DKA (diabetic [place]etoacidoses) ([facility] 07/21/2017  . GERD (gastroesophageal reflux disease) Dx 2008  . Headache(784.0)   . Pneumonia   . Seizures ([facility] 2011    x 2 in lifetime. on Dilantin for a while.   . 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 I&D EXTREMITY Left 10/11/2012   Procedure: IRRIGATION AND DEBRIDEMENT ABSCESS FOREARM;  Surgeon: FLinna Hoff MD;  [facility]  Service: Orthopedics;  Laterality: Left;  . I&D EXTREMITY Left 10/12/2012   Procedure: IRRIGATION AND DEBRIDEMENT FOREARM;  Surgeon: FLinna Hoff MD;  [facility]  Service: Orthopedics;  Laterality: Left;  . I&D EXTREMITY Left 10/14/2012   Procedure: incision and drainage left forearm;  Surgeon: FLinna Hoff MD;  [facility]  Service: Orthopedics;  Laterality: Left;  . I&D EXTREMITY Left 10/16/2012   Procedure:  IRRIGATION AND DEBRIDEMENT LEFT FOREARM;  Surgeon: FLinna Hoff MD;  [place] [facility]  Service: Orthopedics;  Laterality: Left;  . I&D EXTREMITY Left 10/20/2012   Procedure: INCISION AND DRAINAGE AND DEBRIDEMENT LEFT  FOREARM;  Surgeon: FLinna Hoff MD;  [facility]  Service: Orthopedics;  Laterality: Left;  . INCISION AND DRAINAGE OF WOUND Bilateral 11/29/2017   Procedure: DEBRIDEMENT BILATERAL FEET, DEBRIDEMENT LEFT ANKLE, AND APPLY WOUND VAC;  Surgeon: DNewt Minion MD;  [facility]  Service: Orthopedics;  Laterality: Bilateral;  . IRRIGATION AND DEBRIDEMENT ABSCESS     Hx: of left arm abscess related to drug use   . TEE WITHOUT CARDIOVERSION N/A 11/28/2017   Procedure: TRANSESOPHAGEAL ECHOCARDIOGRAM (TEE);  Surgeon: CSanda Klein MD;  [facility]  Service: Cardiovascular;  Laterality: N/A;    Family History  Problem Relation Age of Onset  . Diabetes Father   . Heart disease Father   . Mental illness Sister   .  Cancer Neg Hx     Social History Reviewed with no changes to be made today.   Outpatient Medications Prior to Visit  Medication Sig Dispense Refill  . acetaminophen (TYLENOL) 500 MG tablet Take 2,000 mg by mouth every 6 (six) hours as needed for mild pain.    . Blood Glucose Monitoring Suppl (TRUE METRIX METER) DEVI 1 kit by Does not apply route as directed. Use as directed 1 Device 0  . glucose blood test strip Use as instructed 100 each 12  . sertraline (ZOLOFT) 50 MG tablet Take 1 tablet (50 mg total) by mouth daily. 30 tablet 3  . silver sulfADIAZINE (SILVADENE) 1 % cream Apply topically daily. 50 g 0  . TRUEPLUS LANCETS 28G MISC Use as directed 100 each 12  . amoxicillin (AMOXIL) 875 MG tablet Take 1 tablet (875 mg total) by mouth 2 (two) times daily. 120 tablet 2  . Insulin Glargine (LANTUS SOLOSTAR) 100 UNIT/ML Solostar Pen Inject 40 Units into the skin daily. 12 mL 0  . Insulin Syringe-Needle U-100 (TRUEPLUS INSULIN SYRINGE) 31G X 5/16"  0.5 ML MISC USE AS DIRECTED. 100 each 6  . levothyroxine (SYNTHROID, LEVOTHROID) 50 MCG tablet Take 1 tablet (50 mcg total) by mouth daily. 30 tablet 0  . lisinopril (PRINIVIL,ZESTRIL) 20 MG tablet Take 1 tablet (20 mg total) by mouth daily. 90 tablet 1  . acetaminophen (TYLENOL) 325 MG tablet Take 2 tablets (650 mg total) by mouth every 6 (six) hours as needed for mild pain (or Fever >/= 101). (Patient not taking: Reported on 06/24/2018)    . gabapentin (NEURONTIN) 400 MG capsule Take 1 capsule (400 mg total) by mouth 3 (three) times daily. 90 capsule 2  . omeprazole (PRILOSEC) 20 MG capsule Take 1 capsule (20 mg total) by mouth daily. 90 capsule 3  . silver sulfADIAZINE (SILVADENE) 1 % cream Apply 1 application topically daily. Apply to affected area daily plus dry dressing (Patient not taking: Reported on 06/24/2018) 400 g 0  . traMADol (ULTRAM) 50 MG tablet Take 1 tablet (50 mg total) by mouth 2 (two) times daily. (Patient not taking: Reported on 06/24/2018) 14 tablet 0  . insulin aspart (NOVOLOG) 100 UNIT/ML injection Inject 10 Units into the skin 3 (three) times daily before meals. 3 vial PRN   No facility-administered medications prior to visit.     No Known Allergies     Objective:    BP (!) 156/107 (BP Location: Right Arm, Patient Position: Sitting, Cuff Size: Normal)   Pulse (!) 101   Temp 99.2 F (37.3 C) (Oral)   Ht 5' 9" (1.753 m)   Wt 187 lb 3.2 oz (84.9 kg)   SpO2 97%   BMI 27.64 kg/m  Wt Readings from Last 3 Encounters:  06/24/18 187 lb 3.2 oz (84.9 kg)  03/24/18 180 lb 6.4 oz (81.8 kg)  01/16/18 191 lb 6.4 oz (86.8 kg)    Physical Exam Vitals signs and nursing note reviewed.  Constitutional:      Appearance: He is well-developed.  HENT:     Head: Normocephalic and atraumatic.  Neck:     Musculoskeletal: Normal range of motion.  Cardiovascular:     Rate and Rhythm: Regular rhythm. Tachycardia present.     Heart sounds: Normal heart sounds. No murmur. No friction  rub. No gallop.   Pulmonary:     Effort: Pulmonary effort is normal. No tachypnea or respiratory distress.     Breath sounds: Normal breath sounds. No decreased  breath sounds, wheezing, rhonchi or rales.  Chest:     Chest wall: No tenderness.  Abdominal:     General: Bowel sounds are normal.     Palpations: Abdomen is soft.  Musculoskeletal: Normal range of motion.  Skin:    General: Skin is warm and dry.  Neurological:     Mental Status: He is alert and oriented to person, place, and time.     Coordination: Coordination normal.  Psychiatric:        Behavior: Behavior normal. Behavior is cooperative.        Thought Content: Thought content normal.        Judgment: Judgment normal.          Patient has been counseled extensively about nutrition and exercise as well as the importance of adherence with medications and regular follow-up. The patient was given clear instructions to go to ER or return to medical center if symptoms don't improve, worsen or new problems develop. The patient verbalized understanding.   Follow-up: Return in about 3 months (around 09/23/2018).   Gildardo Pounds, FNP-BC [facility] and [place], [place]   06/24/2018, 5:30 PM

## 2018-07-09 MED FILL — GABAPENTIN 400 MG CAPSULE: 400 | 30 days supply | Qty: 90 | Fill #1

## 2018-07-09 MED FILL — LISINOPRIL 20 MG TAB: 20 | 30 days supply | Qty: 30 | Fill #0

## 2018-07-09 MED FILL — SERTRALINE HCL 50 MG TABS: 50 | 30 days supply | Qty: 30 | Fill #3

## 2018-07-10 MED FILL — LEVOTHYROXINE 50 MCG TABLET: 50 | 30 days supply | Qty: 30 | Fill #0

## 2018-07-10 MED FILL — BUPROPION HCL XL 150 MG TAB: 150 | 30 days supply | Qty: 57 | Fill #0

## 2018-08-08 ENCOUNTER — Other Ambulatory Visit: Payer: Self-pay | Admitting: Nurse Practitioner

## 2018-08-08 DIAGNOSIS — F331 Major depressive disorder, recurrent, moderate: Secondary | ICD-10-CM

## 2018-08-08 MED FILL — GABAPENTIN 400 MG CAPSULE: 400 | 30 days supply | Qty: 90 | Fill #2

## 2018-08-08 MED FILL — $LANTUS SOLOSTAR 100 UNITS/: 100 | 33 days supply | Qty: 27 | Fill #1

## 2018-08-08 MED FILL — TRUE METRIX TEST STRIP: 30 days supply | Qty: 100 | Fill #2

## 2018-08-15 MED FILL — SERTRALINE HCL 50 MG TABS: 50 | 30 days supply | Qty: 30 | Fill #0

## 2018-08-28 ENCOUNTER — Ambulatory Visit: Payer: Self-pay | Attending: Primary Care | Admitting: Primary Care

## 2018-08-28 ENCOUNTER — Encounter: Payer: Self-pay | Admitting: Primary Care

## 2018-08-28 ENCOUNTER — Other Ambulatory Visit: Payer: Self-pay

## 2018-08-28 DIAGNOSIS — E08621 Diabetes mellitus due to underlying condition with foot ulcer: Secondary | ICD-10-CM

## 2018-08-28 DIAGNOSIS — L97428 Non-pressure chronic ulcer of left heel and midfoot with other specified severity: Secondary | ICD-10-CM

## 2018-08-28 DIAGNOSIS — E11621 Type 2 diabetes mellitus with foot ulcer: Secondary | ICD-10-CM | POA: Insufficient documentation

## 2018-08-28 MED FILL — LISINOPRIL 20 MG TABLET: 20 | 30 days supply | Qty: 30 | Fill #1

## 2018-08-28 NOTE — Progress Notes (Signed)
Virtual Visit via Telephone Note  I connected with Luke [name] on 08/28/18 at  2:10 PM EDT by telephone and verified that I am speaking with the correct person using two identifiers.   I discussed the limitations, risks, security and privacy concerns of performing an evaluation and management service by telephone and the availability of in person appointments. I also discussed with the patient that there may be a patient responsible charge related to this service. The patient expressed understanding and agreed to proceed.   History of Present Illness: [name] presents today with a temp of 100 and due to restrictions he can not be seen in office. The fever is most likely due to non healing ulcer and non compliance with Type 1 Diabetes. I called and spoke with Dr. Sharol Given he is out of surgery and said to tell the patient to come to the office now. I gave patient that informations he states he has left and can not go to his office now. Explained to the patient I made arrangements today he will need to call the office today for a appointment. Patient voice understanding and request lisinopril to be refilled he is completely out.   Observations/Objective: Review of Systems  Constitutional: Positive for chills and fever.  HENT: Negative.   Eyes: Negative.   Respiratory: Positive for shortness of breath.   Cardiovascular: Negative.   Gastrointestinal: Negative.   Genitourinary: Negative.   Musculoskeletal: Negative.   Skin:       Diabetic ulcer non healing  Neurological: Positive for weakness.  Endo/Heme/Allergies: Positive for polydipsia.  Psychiatric/Behavioral: Negative.     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 Sharol Given he is out of surgery and said to tell the patient to come to the office now. I gave patient that informations he  states he has left and can not go to his office now. Explained to the patient I made arrangements today he will need to call the office today for a appointment. Patient voice understanding   Follow Up Instructions:    I discussed the assessment and treatment plan with the patient. The patient was provided an opportunity to ask questions and all were answered. The patient agreed with the plan and demonstrated an understanding of the instructions.   The patient was advised to call back or seek an in-person evaluation if the symptoms worsen or if the condition fails to improve as anticipated.  I provided 15 minutes of non-face-to-face time during this encounter.   Kerin Perna, NP

## 2018-08-29 ENCOUNTER — Ambulatory Visit (INDEPENDENT_AMBULATORY_CARE_PROVIDER_SITE_OTHER): Payer: Self-pay | Admitting: Physician Assistant

## 2018-08-29 ENCOUNTER — Encounter: Payer: Self-pay | Admitting: Physician Assistant

## 2018-08-29 VITALS — Ht 69.0 in | Wt 187.2 lb

## 2018-08-29 DIAGNOSIS — L97522 Non-pressure chronic ulcer of other part of left foot with fat layer exposed: Secondary | ICD-10-CM

## 2018-08-29 MED ORDER — SULFAMETHOXAZOLE-TRIMETHOPRIM 800-160 MG PO TABS: 1.0000 | ORAL_TABLET | Freq: Two times a day (BID) | ORAL | 1 refills | Status: DC

## 2018-08-29 MED FILL — ?SULFAMETHOXAZOLE-TMP DS TA: 800-160 | 14 days supply | Qty: 28 | Fill #0

## 2018-08-29 NOTE — Progress Notes (Signed)
Office Visit Note   [name]           Date of Birth: [birth year]           MRN: [id number] Visit Date: 08/29/2018              Requested by: Gildardo Pounds, NP [address] [place],  [place] [address] PCP: Gildardo Pounds, NP  Chief Complaint  Patient presents with  . Left Foot - Follow-up, Pain  . Left Ankle - Follow-up, Pain      HPI: The patient is a 38 yo gentleman with poorly controlled Type I diabetes with peripheral neuropathy and a Hgb A1C of 12.6 who presents with a new ulcer over the dorsum of the left foot. He reports he first noticed the ulcer about 1 week ago and then he noticed some pus draining from the area. He saw the PCP yesterday and was referred for further evaluation. He has not been on antibiotics and has been apply a dry dressing to the area.  His wife is an Therapist, sports and assists him with dressing changes and wound care.   Assessment & Plan: Visit Diagnoses:  1. Foot ulcer with fat layer exposed, left ([place])     Plan: wound drainage was sent for culture today. Empiric Bactrim DS 1 po BID begun. Also begin iodosorb ointment to the wound daily after cleaning with soap and water and dry gauze and ace wrap. Post op shoe supplied as well. Elevate and off load as much as possible. Advised patient to seek care in ER if any worsening of redness, drainage or fever of chills.   Recheck early next week.  Follow-Up Instructions: Return in about 3 days (around 09/01/2018).   Ortho Exam  Patient is alert, oriented, no adenopathy, well-dressed, normal affect, normal respiratory effort. Left foot with a ~ 1cm ulcer with purulent thick tan drainage and fat in wound bed. The area was cleaned with gauze and bleeding observed following cleaning. Purulent drainage was cultured. ? Visible fascia in wound bed without clear exposure of tendon or bone. Good palpable dorsalis pedis pulse. Mild edema and erythema.  Scars from previous ulcerations well healed.   Imaging:  No results found.   Labs: Lab Results  Component Value Date   HGBA1C 12.6 (A) 06/24/2018   HGBA1C >15 03/24/2018   HGBA1C 12.1 (H) 11/22/2017   ESRSEDRATE 40 (H) 12/03/2014   CRP 9.0 (H) 11/23/2017   CRP 12.2 (H) 11/22/2017   CRP 8.7 (H) 12/03/2014   REPTSTATUS 12/04/2017 FINAL 11/29/2017   GRAMSTAIN  11/29/2017    FEW WBC PRESENT, PREDOMINANTLY PMN NO ORGANISMS SEEN    CULT  11/29/2017    NO ANAEROBES ISOLATED Performed at [facility], [place] [address] [place], [place] [address]    LABORGA METHICILLIN RESISTANT STAPHYLOCOCCUS AUREUS (A) 12/16/2016     Lab Results  Component Value Date   ALBUMIN 2.6 (L) 12/10/2017   ALBUMIN 2.6 (L) 12/04/2017   ALBUMIN 2.1 (L) 11/23/2017    Body mass index is 27.64 kg/m.  Orders:  No orders of the defined types were placed in this encounter.  Meds ordered this encounter  Medications  . sulfamethoxazole-trimethoprim (BACTRIM DS) 800-160 MG tablet    Sig: Take 1 tablet by mouth 2 (two) times daily.    Dispense:  28 tablet    Refill:  1     Procedures: No procedures performed  Clinical Data: No additional findings.  ROS:  All other systems negative, except as noted in the HPI. Review of Systems  Objective: Vital Signs: Ht 5\' 9"  (1.753 m)   Wt 187 lb 3.2 oz (84.9 kg)   BMI 27.64 kg/m   Specialty Comments:  No specialty comments available.  PMFS History: Patient Active Problem List   Diagnosis Date Noted  . Diabetic ulcer of left foot ([place]) 08/28/2018  . Acute hematogenous osteomyelitis, left ankle and foot ([place])   . Streptococcal bacteremia 11/25/2017  . Hepatitis C 11/25/2017  . Actinomyces infection   . Acute renal failure superimposed on stage 2 chronic kidney disease ([place]) 07/21/2017  . GERD (gastroesophageal reflux disease) 07/21/2017  . Diabetic polyneuropathy associated with type 1 diabetes mellitus ([place])   . Prostatitis, acute   . MRSA infection   . Noncompliant bladder   . Urinary retention    . Prostate abscess   . ARF (acute renal failure) ([place]) 12/16/2016  . Anemia 12/16/2016  . Cocaine abuse ([place]) 08/16/2015  . Uncontrolled insulin dependent type 1 diabetes mellitus ([place]) 08/16/2015  . AKI (acute kidney injury) ([place]) 12/03/2014  . Elevated liver enzymes 07/26/2014  . Dental caries 07/23/2014  . Tinea pedis 07/23/2014  . DKA (diabetic ketoacidoses) ([place]) 11/23/2013  . Abscess 10/14/2012  . Drug abuse, IV ([place]) 10/11/2012  . Tobacco abuse 10/11/2012  . Essential hypertension 10/11/2012   Past Medical History:  Diagnosis Date  . Anemia   . Anxiety  Dx 2008  . Diabetes mellitus without complication ([place]) Dx 1660  . DKA (diabetic ketoacidoses) ([place]) 07/21/2017  . GERD (gastroesophageal reflux disease) Dx 2008  . Headache(784.0)   . Pneumonia   . Seizures ([place]) 2011    x 2 in lifetime. on Dilantin for a while.   . Substance abuse ([place]) 2013    heroin use, multiple relapses    Family History  Problem Relation Age of Onset  . Diabetes Father   . Heart disease Father   . Mental illness Sister   . Cancer Neg Hx     Past Surgical History:  Procedure Laterality Date  . I&D EXTREMITY Left 10/11/2012   Procedure: IRRIGATION AND DEBRIDEMENT ABSCESS FOREARM;  Surgeon: Linna Hoff, MD;  Location: [facility];  Service: Orthopedics;  Laterality: Left;  . I&D EXTREMITY Left 10/12/2012   Procedure: IRRIGATION AND DEBRIDEMENT FOREARM;  Surgeon: Linna Hoff, MD;  Location: [facility];  Service: Orthopedics;  Laterality: Left;  . I&D EXTREMITY Left 10/14/2012   Procedure: incision and drainage left forearm;  Surgeon: Linna Hoff, MD;  Location: [place];  Service: Orthopedics;  Laterality: Left;  . I&D EXTREMITY Left 10/16/2012   Procedure: IRRIGATION AND DEBRIDEMENT LEFT FOREARM;  Surgeon: Linna Hoff, MD;  Location: [place];  Service: Orthopedics;  Laterality: Left;  . I&D EXTREMITY Left 10/20/2012   Procedure: INCISION AND DRAINAGE AND DEBRIDEMENT LEFT  FOREARM;  Surgeon: Linna Hoff, MD;  Location: [facility];  Service: Orthopedics;  Laterality: Left;  . INCISION AND DRAINAGE OF WOUND Bilateral 11/29/2017   Procedure: DEBRIDEMENT BILATERAL FEET, DEBRIDEMENT LEFT ANKLE, AND APPLY WOUND VAC;  Surgeon: [name], MD;  Location: [facility] [place];  Service: Orthopedics;  Laterality: Bilateral;  . IRRIGATION AND DEBRIDEMENT ABSCESS     Hx: of left arm abscess related to drug use   . TEE WITHOUT CARDIOVERSION N/A 11/28/2017   Procedure: TRANSESOPHAGEAL ECHOCARDIOGRAM (TEE);  Surgeon: Sanda Klein, MD;  Location: [place];  Service: Cardiovascular;  Laterality: N/A;   Social History  Occupational History  . Occupation: Event set up    Employer: [employer]   . Occupation: Stencil for cars   Tobacco Use  . Smoking status: Current Every Day Smoker    Packs/day: 1.00    Types: Cigarettes  . Smokeless tobacco: Never Used  Substance and Sexual Activity  . Alcohol use: No    Alcohol/week: 0.0 standard drinks    Comment: occasional  . Drug use: Not Currently    Types: Cocaine, IV, Heroin    Comment: IVDU since Dad died in June 29, 2006 with 15 month period of sobriety 2018-2019, relapse Aug 2019.  Marland Kitchen Sexual activity: Yes    Partners: Female    Birth control/protection: None

## 2018-09-01 ENCOUNTER — Ambulatory Visit: Payer: Medicaid Other | Admitting: Orthopedic Surgery

## 2018-09-01 LAB — WOUND CULTURE
MICRO NUMBER:: 564411
SPECIMEN QUALITY:: ADEQUATE

## 2018-09-04 ENCOUNTER — Encounter: Payer: Self-pay | Admitting: Orthopedic Surgery

## 2018-09-04 ENCOUNTER — Ambulatory Visit (INDEPENDENT_AMBULATORY_CARE_PROVIDER_SITE_OTHER): Payer: Self-pay | Admitting: Orthopedic Surgery

## 2018-09-04 ENCOUNTER — Encounter (HOSPITAL_COMMUNITY): Payer: Self-pay

## 2018-09-04 ENCOUNTER — Ambulatory Visit (INDEPENDENT_AMBULATORY_CARE_PROVIDER_SITE_OTHER): Payer: Self-pay

## 2018-09-04 ENCOUNTER — Other Ambulatory Visit: Payer: Self-pay

## 2018-09-04 VITALS — Ht 69.0 in | Wt 187.2 lb

## 2018-09-04 DIAGNOSIS — M79672 Pain in left foot: Secondary | ICD-10-CM

## 2018-09-04 DIAGNOSIS — L02612 Cutaneous abscess of left foot: Secondary | ICD-10-CM

## 2018-09-04 DIAGNOSIS — E1042 Type 1 diabetes mellitus with diabetic polyneuropathy: Secondary | ICD-10-CM

## 2018-09-04 DIAGNOSIS — M86072 Acute hematogenous osteomyelitis, left ankle and foot: Secondary | ICD-10-CM

## 2018-09-04 NOTE — Progress Notes (Signed)
Completed pre-op called. Patient is type 1 diabetic. Informed patient to check sugar in the AM and treat with 1/2 cup of juice if blood sugar is less than 70. Patient to take 80% of lantus AM of surgery.  Patient verbalized understanding.

## 2018-09-04 NOTE — Progress Notes (Signed)
Office Visit Note   [name]           Date of Birth: 01/21/1981           MRN: [id number] Visit Date: 09/04/2018              Requested by: Gildardo Pounds, NP [address] [place],  [place] [address] PCP: Gildardo Pounds, NP  Chief Complaint  Patient presents with  . Left Foot - Pain, Follow-up  . Right Foot - Pain, Follow-up      HPI: Patient is a 38 year old gentleman who is status post debridement of abscess and ulcers of both lower extremities in September of last year.  Patient presents with subacute swelling with abscess ulceration drainage.  Patient was started on Bactrim DS cultures were positive for MRSA sensitive to the Bactrim DS.  Patient presents complaining of increased swelling increased purulent drainage increased pain.  Patient states he is in severe pain but is afraid to go to the emergency room.  Assessment & Plan: Visit Diagnoses:  1. Pain in left foot     Plan: Patient has an abscess involving the great toe just proximal to the MTP joint distally.  Discussed that ideally we could proceed with a amputation of the first ray to resolve the infection.  We will try to proceed with outpatient surgery tomorrow.  Discussed that if we wait or there is more extensive infection we may require more surgery or may require admission.  Patient states he understands wished to proceed at this time.  Discussed the risk of foot amputation.  Follow-Up Instructions: No follow-ups on file.   Ortho Exam  Patient is alert, oriented, no adenopathy, well-dressed, normal affect, normal respiratory effort. Examination patient has a strong dorsalis pedis pulse he has multiple surgical incisions are well-healed.  He has sausage digit swelling of the left great toe there is purulent drainage dorsally just proximal to the MTP joint as well as purulent drainage in the first webspace.  There is no crepitation in the soft tissue no ascending cellulitis.  Radiographs show no  air in the soft tissue no clinical signs of necrotizing fasciitis.  Imaging: No results found. No images are attached to the encounter.  Labs: Lab Results  Component Value Date   HGBA1C 12.6 (A) 06/24/2018   HGBA1C >15 03/24/2018   HGBA1C 12.1 (H) 11/22/2017   ESRSEDRATE 40 (H) 12/03/2014   CRP 9.0 (H) 11/23/2017   CRP 12.2 (H) 11/22/2017   CRP 8.7 (H) 12/03/2014   REPTSTATUS 12/04/2017 FINAL 11/29/2017   GRAMSTAIN  11/29/2017    FEW WBC PRESENT, PREDOMINANTLY PMN NO ORGANISMS SEEN    CULT  11/29/2017    NO ANAEROBES ISOLATED Performed at [facility], [place] [address], [name],  [address]    LABORGA METHICILLIN RESISTANT STAPHYLOCOCCUS AUREUS (A) 12/16/2016     Lab Results  Component Value Date   ALBUMIN 2.6 (L) 12/10/2017   ALBUMIN 2.6 (L) 12/04/2017   ALBUMIN 2.1 (L) 11/23/2017    Body mass index is 27.64 kg/m.  Orders:  Orders Placed This Encounter  Procedures  . XR Foot Complete Left   No orders of the defined types were placed in this encounter.    Procedures: No procedures performed  Clinical Data: No additional findings.  ROS:  All other systems negative, except as noted in the HPI. Review of Systems  Objective: Vital Signs: Ht 5\' 9"  (1.753 m)   Wt 187 lb  3.2 oz (84.9 kg)   BMI 27.64 kg/m   Specialty Comments:  No specialty comments available.  PMFS History: Patient Active Problem List   Diagnosis Date Noted  . Diabetic ulcer of left foot ([place]) 08/28/2018  . Acute hematogenous osteomyelitis, left ankle and foot ([place])   . Streptococcal bacteremia 11/25/2017  . Hepatitis C 11/25/2017  . Actinomyces infection   . Acute renal failure superimposed on stage 2 chronic kidney disease ([place]) 07/21/2017  . GERD (gastroesophageal reflux disease) 07/21/2017  . Diabetic polyneuropathy associated with type 1 diabetes mellitus ([place])   . Prostatitis, acute   . MRSA infection   . Noncompliant bladder   . Urinary retention   . Prostate  abscess   . ARF (acute renal failure) ([place]) 12/16/2016  . Anemia 12/16/2016  . Cocaine abuse ([place]) 08/16/2015  . Uncontrolled insulin dependent type 1 diabetes mellitus ([place]) 08/16/2015  . AKI (acute kidney injury) ([place]) 12/03/2014  . Elevated liver enzymes 07/26/2014  . Dental caries 07/23/2014  . Tinea pedis 07/23/2014  . DKA (diabetic ketoacidoses) ([place]) 11/23/2013  . Abscess 10/14/2012  . 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 Tobacco abuse 10/11/2012  . Essential hypertension 10/11/2012   Past Medical History:  Diagnosis Date  . Anemia   . Anxiety  Dx 2008  . Diabetes mellitus without complication ([place]) Dx 2330  . DKA (diabetic ketoacidoses) ([place]) 07/21/2017  . GERD (gastroesophageal reflux disease) Dx 2008  . Headache(784.0)   . Pneumonia   . Seizures ([place]) 2011    x 2 in lifetime. on Dilantin for a while.   . 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 Diabetes Father   . Heart disease Father   . Mental illness Sister   . Cancer Neg Hx     Past Surgical History:  Procedure Laterality Date  . I&D EXTREMITY Left 10/11/2012   Procedure: IRRIGATION AND DEBRIDEMENT ABSCESS FOREARM;  Surgeon: Linna Hoff, MD;  Location: [facility];  Service: Orthopedics;  Laterality: Left;  . I&D EXTREMITY Left 10/12/2012   Procedure: IRRIGATION AND DEBRIDEMENT FOREARM;  Surgeon: Linna Hoff, MD;  Location: [facility];  Service: Orthopedics;  Laterality: Left;  . I&D EXTREMITY Left 10/14/2012   Procedure: incision and drainage left forearm;  Surgeon: Linna Hoff, MD;  Location: [facility];  Service: Orthopedics;  Laterality: Left;  . I&D EXTREMITY Left 10/16/2012   Procedure: IRRIGATION AND DEBRIDEMENT LEFT FOREARM;  Surgeon: Linna Hoff, MD;  Location: [facility];  Service: Orthopedics;  Laterality: Left;  . I&D EXTREMITY Left 10/20/2012   Procedure: INCISION AND DRAINAGE AND DEBRIDEMENT LEFT  FOREARM;  Surgeon: Linna Hoff, MD;   Location: [facility];  Service: Orthopedics;  Laterality: Left;  . INCISION AND DRAINAGE OF WOUND Bilateral 11/29/2017   Procedure: DEBRIDEMENT BILATERAL FEET, DEBRIDEMENT LEFT ANKLE, AND APPLY WOUND VAC;  Surgeon: [name], MD;  Location: [facility];  Service: Orthopedics;  Laterality: Bilateral;  . IRRIGATION AND DEBRIDEMENT ABSCESS     Hx: of left arm abscess related to drug use   . TEE WITHOUT CARDIOVERSION N/A 11/28/2017   Procedure: TRANSESOPHAGEAL ECHOCARDIOGRAM (TEE);  Surgeon: Sanda Klein, MD;  Location: [facility] ENDOSCOPY;  Service: Cardiovascular;  Laterality: N/A;   Social History   Occupational History  . Occupation: Event set up    Employer: [employer]   . Occupation: Stencil for  cars   Tobacco Use  . Smoking status: Current Every Day Smoker    Packs/day: 1.00    Types: Cigarettes  . Smokeless tobacco: Never Used  Substance and Sexual Activity  . Alcohol use: No    Alcohol/week: 0.0 standard drinks    Comment: occasional  . Drug use: Not Currently    Types: Cocaine, IV, Heroin    Comment: IVDU since Dad died in 28-Jun-2006 with 15 month period of sobriety 2018-2019, relapse Aug 2019.  Marland Kitchen Sexual activity: Yes    Partners: Female    Birth control/protection: None

## 2018-09-05 ENCOUNTER — Encounter (HOSPITAL_COMMUNITY)
Admission: RE | Disposition: A | Discharge status: Home / Self Care | Payer: Self-pay | Source: Home / Self Care | Attending: Orthopedic Surgery

## 2018-09-05 ENCOUNTER — Other Ambulatory Visit: Payer: Self-pay

## 2018-09-05 ENCOUNTER — Other Ambulatory Visit (HOSPITAL_COMMUNITY): Payer: Self-pay

## 2018-09-05 ENCOUNTER — Encounter (HOSPITAL_COMMUNITY): Payer: Self-pay

## 2018-09-05 ENCOUNTER — Ambulatory Visit (HOSPITAL_COMMUNITY): Payer: Medicaid Other | Admitting: Certified Registered"

## 2018-09-05 ENCOUNTER — Ambulatory Visit (HOSPITAL_COMMUNITY)
Admission: RE | Admit: 2018-09-05 | Discharge: 2018-09-05 | Disposition: A | Discharge status: Home / Self Care | Payer: Medicaid Other | Attending: Orthopedic Surgery | Admitting: Orthopedic Surgery

## 2018-09-05 DIAGNOSIS — F17210 Nicotine dependence, cigarettes, uncomplicated: Secondary | ICD-10-CM | POA: Diagnosis not present

## 2018-09-05 DIAGNOSIS — B9562 Methicillin resistant Staphylococcus aureus infection as the cause of diseases classified elsewhere: Secondary | ICD-10-CM | POA: Diagnosis not present

## 2018-09-05 DIAGNOSIS — R000 Tachycardia, unspecified: Secondary | ICD-10-CM | POA: Insufficient documentation

## 2018-09-05 DIAGNOSIS — Z794 Long term (current) use of insulin: Secondary | ICD-10-CM | POA: Insufficient documentation

## 2018-09-05 DIAGNOSIS — F1111 Opioid abuse, in remission: Secondary | ICD-10-CM | POA: Insufficient documentation

## 2018-09-05 DIAGNOSIS — Z8249 Family history of ischemic heart disease and other diseases of the circulatory system: Secondary | ICD-10-CM | POA: Diagnosis not present

## 2018-09-05 DIAGNOSIS — Z833 Family history of diabetes mellitus: Secondary | ICD-10-CM | POA: Insufficient documentation

## 2018-09-05 DIAGNOSIS — M86272 Subacute osteomyelitis, left ankle and foot: Secondary | ICD-10-CM

## 2018-09-05 DIAGNOSIS — L02612 Cutaneous abscess of left foot: Secondary | ICD-10-CM | POA: Diagnosis not present

## 2018-09-05 DIAGNOSIS — E1022 Type 1 diabetes mellitus with diabetic chronic kidney disease: Secondary | ICD-10-CM | POA: Diagnosis not present

## 2018-09-05 DIAGNOSIS — N189 Chronic kidney disease, unspecified: Secondary | ICD-10-CM | POA: Diagnosis not present

## 2018-09-05 DIAGNOSIS — L97529 Non-pressure chronic ulcer of other part of left foot with unspecified severity: Secondary | ICD-10-CM | POA: Diagnosis present

## 2018-09-05 DIAGNOSIS — E1042 Type 1 diabetes mellitus with diabetic polyneuropathy: Secondary | ICD-10-CM | POA: Insufficient documentation

## 2018-09-05 DIAGNOSIS — Z818 Family history of other mental and behavioral disorders: Secondary | ICD-10-CM | POA: Diagnosis not present

## 2018-09-05 DIAGNOSIS — E10621 Type 1 diabetes mellitus with foot ulcer: Secondary | ICD-10-CM | POA: Diagnosis not present

## 2018-09-05 DIAGNOSIS — K219 Gastro-esophageal reflux disease without esophagitis: Secondary | ICD-10-CM | POA: Diagnosis not present

## 2018-09-05 DIAGNOSIS — F419 Anxiety disorder, unspecified: Secondary | ICD-10-CM | POA: Insufficient documentation

## 2018-09-05 DIAGNOSIS — R51 Headache: Secondary | ICD-10-CM | POA: Diagnosis not present

## 2018-09-05 DIAGNOSIS — I129 Hypertensive chronic kidney disease with stage 1 through stage 4 chronic kidney disease, or unspecified chronic kidney disease: Secondary | ICD-10-CM | POA: Insufficient documentation

## 2018-09-05 DIAGNOSIS — Z1159 Encounter for screening for other viral diseases: Secondary | ICD-10-CM | POA: Diagnosis not present

## 2018-09-05 HISTORY — DX: Essential (primary) hypertension: I10

## 2018-09-05 HISTORY — PX: AMPUTATION: SHX166

## 2018-09-05 HISTORY — DX: Chronic kidney disease, unspecified: N18.9

## 2018-09-05 LAB — COMPREHENSIVE METABOLIC PANEL
ALT: 14 U/L (ref 0–44)
AST: 24 U/L (ref 15–41)
Albumin: 2.5 g/dL — ABNORMAL LOW (ref 3.5–5.0)
Alkaline Phosphatase: 115 U/L (ref 38–126)
Anion gap: 11 (ref 5–15)
BUN: 22 mg/dL — ABNORMAL HIGH (ref 6–20)
CO2: 25 mmol/L (ref 22–32)
Calcium: 9 mg/dL (ref 8.9–10.3)
Chloride: 97 mmol/L — ABNORMAL LOW (ref 98–111)
Creatinine, Ser: 2.13 mg/dL — ABNORMAL HIGH (ref 0.61–1.24)
GFR calc Af Amer: 44 mL/min — ABNORMAL LOW (ref >60)
GFR calc non Af Amer: 38 mL/min — ABNORMAL LOW (ref >60)
Glucose, Bld: 305 mg/dL — ABNORMAL HIGH (ref 70–99)
Potassium: 6.8 mmol/L (ref 3.5–5.1)
Sodium: 133 mmol/L — ABNORMAL LOW (ref 135–145)
Total Bilirubin: 0.3 mg/dL (ref 0.3–1.2)
Total Protein: 7.4 g/dL (ref 6.5–8.1)

## 2018-09-05 LAB — POCT I-STAT 4, (NA,K, GLUC, HGB,HCT)
Glucose, Bld: 312 mg/dL — ABNORMAL HIGH (ref 70–99)
HCT: 28 % — ABNORMAL LOW (ref 39.0–52.0)
Hemoglobin: 9.5 g/dL — ABNORMAL LOW (ref 13.0–17.0)
Potassium: 5.8 mmol/L — ABNORMAL HIGH (ref 3.5–5.1)
Sodium: 133 mmol/L — ABNORMAL LOW (ref 135–145)

## 2018-09-05 LAB — CBC
HCT: 29.8 % — ABNORMAL LOW (ref 39.0–52.0)
Hemoglobin: 9.4 g/dL — ABNORMAL LOW (ref 13.0–17.0)
MCH: 24.7 pg — ABNORMAL LOW (ref 26.0–34.0)
MCHC: 31.5 g/dL (ref 30.0–36.0)
MCV: 78.2 fL — ABNORMAL LOW (ref 80.0–100.0)
Platelets: 604 10*3/uL — ABNORMAL HIGH (ref 150–400)
RBC: 3.81 MIL/uL — ABNORMAL LOW (ref 4.22–5.81)
RDW: 14.4 % (ref 11.5–15.5)
WBC: 18.9 10*3/uL — ABNORMAL HIGH (ref 4.0–10.5)
nRBC: 0 % (ref 0.0–0.2)

## 2018-09-05 LAB — GLUCOSE, CAPILLARY
Glucose-Capillary: 287 mg/dL — ABNORMAL HIGH (ref 70–99)
Glucose-Capillary: 314 mg/dL — ABNORMAL HIGH (ref 70–99)
Glucose-Capillary: 237 mg/dL — ABNORMAL HIGH (ref 70–99)

## 2018-09-05 LAB — SARS CORONAVIRUS 2: SARS Coronavirus 2: NOT DETECTED

## 2018-09-05 SURGERY — AMPUTATION DIGIT
Anesthesia: Monitor Anesthesia Care | Laterality: Left

## 2018-09-05 MED ORDER — MIDAZOLAM HCL 2 MG/2ML IJ SOLN
INTRAMUSCULAR | Status: AC
  Filled 2018-09-05: qty 2

## 2018-09-05 MED ORDER — CEFAZOLIN SODIUM-DEXTROSE 2-4 GM/100ML-% IV SOLN: 2.0000 g | INTRAVENOUS | Status: DC

## 2018-09-05 MED ORDER — CEFAZOLIN SODIUM-DEXTROSE 2-3 GM-%(50ML) IV SOLR
INTRAVENOUS | Status: DC | PRN
  Administered 2018-09-05: 2 g via INTRAVENOUS

## 2018-09-05 MED ORDER — INSULIN ASPART 100 UNIT/ML ~~LOC~~ SOLN
SUBCUTANEOUS | Status: AC
  Administered 2018-09-05: 6 [IU] via SUBCUTANEOUS
  Filled 2018-09-05: qty 1

## 2018-09-05 MED ORDER — MIDAZOLAM HCL 2 MG/2ML IJ SOLN
INTRAMUSCULAR | Status: DC | PRN
  Administered 2018-09-05: 2 mg via INTRAVENOUS

## 2018-09-05 MED ORDER — OXYCODONE-ACETAMINOPHEN 5-325 MG PO TABS
1.0000 | ORAL_TABLET | ORAL | Status: DC | PRN
  Administered 2018-09-05: 1 via ORAL

## 2018-09-05 MED ORDER — FENTANYL CITRATE (PF) 250 MCG/5ML IJ SOLN
INTRAMUSCULAR | Status: AC
  Filled 2018-09-05: qty 5

## 2018-09-05 MED ORDER — PROPOFOL 10 MG/ML IV BOLUS
INTRAVENOUS | Status: DC | PRN
  Administered 2018-09-05: 30 mg via INTRAVENOUS
  Administered 2018-09-05: 50 mg via INTRAVENOUS
  Administered 2018-09-05: 30 mg via INTRAVENOUS

## 2018-09-05 MED ORDER — FENTANYL CITRATE (PF) 100 MCG/2ML IJ SOLN
25.0000 ug | INTRAMUSCULAR | Status: DC | PRN
  Administered 2018-09-05 (×2): 50 ug via INTRAVENOUS

## 2018-09-05 MED ORDER — ACETAMINOPHEN 500 MG PO TABS
1000.0000 mg | ORAL_TABLET | Freq: Once | ORAL | Status: AC
  Administered 2018-09-05: 1000 mg via ORAL
  Filled 2018-09-05: qty 2

## 2018-09-05 MED ORDER — LIDOCAINE HCL (CARDIAC) PF 100 MG/5ML IV SOSY
PREFILLED_SYRINGE | INTRAVENOUS | Status: DC | PRN
  Administered 2018-09-05: 80 mg via INTRAVENOUS

## 2018-09-05 MED ORDER — OXYCODONE-ACETAMINOPHEN 5-325 MG PO TABS: 1.0000 | ORAL_TABLET | ORAL | 0 refills | Status: DC | PRN

## 2018-09-05 MED ORDER — OXYCODONE-ACETAMINOPHEN 5-325 MG PO TABS
ORAL_TABLET | ORAL | Status: AC
  Filled 2018-09-05: qty 1

## 2018-09-05 MED ORDER — PROPOFOL 500 MG/50ML IV EMUL
INTRAVENOUS | Status: DC | PRN
  Administered 2018-09-05: 125 ug/kg/min via INTRAVENOUS

## 2018-09-05 MED ORDER — POVIDONE-IODINE 10 % EX SWAB: 2.0000 "application " | Freq: Once | CUTANEOUS | Status: DC

## 2018-09-05 MED ORDER — CEFAZOLIN SODIUM-DEXTROSE 2-4 GM/100ML-% IV SOLN
INTRAVENOUS | Status: AC
  Filled 2018-09-05: qty 100

## 2018-09-05 MED ORDER — INSULIN ASPART 100 UNIT/ML ~~LOC~~ SOLN
6.0000 [IU] | Freq: Once | SUBCUTANEOUS | Status: AC
  Administered 2018-09-05: 6 [IU] via SUBCUTANEOUS

## 2018-09-05 MED ORDER — ENSURE PRE-SURGERY PO LIQD
296.0000 mL | Freq: Once | ORAL | Status: DC
  Filled 2018-09-05: qty 296

## 2018-09-05 MED ORDER — ONDANSETRON HCL 4 MG/2ML IJ SOLN
INTRAMUSCULAR | Status: DC | PRN
  Administered 2018-09-05: 4 mg via INTRAVENOUS

## 2018-09-05 MED ORDER — BUPIVACAINE HCL (PF) 0.25 % IJ SOLN
INTRAMUSCULAR | Status: DC | PRN
  Administered 2018-09-05: 30 mL

## 2018-09-05 MED ORDER — 0.9 % SODIUM CHLORIDE (POUR BTL) OPTIME
TOPICAL | Status: DC | PRN
  Administered 2018-09-05: 1000 mL

## 2018-09-05 MED ORDER — LACTATED RINGERS IV SOLN
INTRAVENOUS | Status: DC | PRN
  Administered 2018-09-05: 13:00:00 via INTRAVENOUS

## 2018-09-05 MED ORDER — BUPIVACAINE HCL (PF) 0.25 % IJ SOLN
INTRAMUSCULAR | Status: AC
  Filled 2018-09-05: qty 30

## 2018-09-05 MED ORDER — FENTANYL CITRATE (PF) 250 MCG/5ML IJ SOLN
INTRAMUSCULAR | Status: DC | PRN
  Administered 2018-09-05 (×5): 50 ug via INTRAVENOUS

## 2018-09-05 MED ORDER — CHLORHEXIDINE GLUCONATE 4 % EX LIQD: 60.0000 mL | Freq: Once | CUTANEOUS | Status: DC

## 2018-09-05 MED ORDER — FENTANYL CITRATE (PF) 100 MCG/2ML IJ SOLN
INTRAMUSCULAR | Status: AC
  Filled 2018-09-05: qty 2

## 2018-09-05 SURGICAL SUPPLY — 30 items
BLADE SURG 21 STRL SS (BLADE) ×3 IMPLANT
BNDG COHESIVE 4X5 TAN STRL (GAUZE/BANDAGES/DRESSINGS) ×3 IMPLANT
BNDG ESMARK 4X9 LF (GAUZE/BANDAGES/DRESSINGS) IMPLANT
BNDG GAUZE ELAST 4 BULKY (GAUZE/BANDAGES/DRESSINGS) ×3 IMPLANT
COVER SURGICAL LIGHT HANDLE (MISCELLANEOUS) ×6 IMPLANT
COVER WAND RF STERILE (DRAPES) ×3 IMPLANT
DRAPE U-SHAPE 47X51 STRL (DRAPES) ×3 IMPLANT
DRSG ADAPTIC 3X8 NADH LF (GAUZE/BANDAGES/DRESSINGS) IMPLANT
DRSG PAD ABDOMINAL 8X10 ST (GAUZE/BANDAGES/DRESSINGS) ×3 IMPLANT
DURAPREP 26ML APPLICATOR (WOUND CARE) ×3 IMPLANT
ELECT REM PT RETURN 9FT ADLT (ELECTROSURGICAL) ×3
ELECTRODE REM PT RTRN 9FT ADLT (ELECTROSURGICAL) ×1 IMPLANT
GAUZE SPONGE 4X4 12PLY STRL (GAUZE/BANDAGES/DRESSINGS) IMPLANT
GLOVE BIOGEL PI IND STRL 9 (GLOVE) ×1 IMPLANT
GLOVE BIOGEL PI INDICATOR 9 (GLOVE) ×2
GLOVE SURG ORTHO 9.0 STRL STRW (GLOVE) ×3 IMPLANT
GOWN STRL REUS W/ TWL XL LVL3 (GOWN DISPOSABLE) ×2 IMPLANT
GOWN STRL REUS W/TWL XL LVL3 (GOWN DISPOSABLE) ×4
KIT BASIN OR (CUSTOM PROCEDURE TRAY) ×3 IMPLANT
KIT DRSG PREVENA PLUS 7DAY 125 (MISCELLANEOUS) ×3 IMPLANT
KIT PREVENA INCISION MGT 13 (CANNISTER) ×3 IMPLANT
KIT TURNOVER KIT B (KITS) ×3 IMPLANT
MANIFOLD NEPTUNE II (INSTRUMENTS) ×3 IMPLANT
NEEDLE 22X1 1/2 (OR ONLY) (NEEDLE) IMPLANT
NS IRRIG 1000ML POUR BTL (IV SOLUTION) ×3 IMPLANT
PACK ORTHO EXTREMITY (CUSTOM PROCEDURE TRAY) ×3 IMPLANT
PAD ARMBOARD 7.5X6 YLW CONV (MISCELLANEOUS) ×6 IMPLANT
SUT ETHILON 2 0 PSLX (SUTURE) ×3 IMPLANT
SYR CONTROL 10ML LL (SYRINGE) IMPLANT
TOWEL OR 17X26 10 PK STRL BLUE (TOWEL DISPOSABLE) ×3 IMPLANT

## 2018-09-05 NOTE — Transfer of Care (Signed)
Immediate Anesthesia Transfer of Care Note  [name]  Procedure(s) Performed: LEFT GREAT TOE AMPUTATION (Left )  Patient Location: PACU  Anesthesia Type:MAC  Level of Consciousness: drowsy, patient cooperative and responds to stimulation  Airway & Oxygen Therapy: Patient Spontanous Breathing SFM 02  Post-op Assessment: Report given to RN and Post -op Vital signs reviewed and stable  Post vital signs: Reviewed and stable  Last Vitals:  Vitals Value Taken Time  BP 102/46 09/05/18 1405  Temp 36.1 C 09/05/18 1401  Pulse 83 09/05/18 1408  Resp 18 09/05/18 1408  SpO2 100 % 09/05/18 1408  Vitals shown include unvalidated device data.  Last Pain:  Vitals:   09/05/18 1401  TempSrc:   PainSc: (P) Asleep      Patients Stated Pain Goal: 3 (19/16/60 6004)  Complications: No apparent anesthesia complications

## 2018-09-05 NOTE — Anesthesia Preprocedure Evaluation (Addendum)
Anesthesia Evaluation  Patient identified by MRN, date of birth, ID band Patient awake    Reviewed: Allergy & Precautions, NPO status , Patient's Chart, lab work & pertinent test results  Airway Mallampati: II  TM Distance: >3 FB Neck ROM: Full    Dental no notable dental hx. (+) Teeth Intact, Dental Advisory Given   Pulmonary Current Smoker,    Pulmonary exam normal breath sounds clear to auscultation       Cardiovascular hypertension, Normal cardiovascular exam Rhythm:Regular Rate:Normal  TTE 2019 - Left ventricle: There was mild concentric hypertrophy. Systolic function was normal. The estimated ejection fraction was in the range of 60% to 65%. Wall motion was normal; there were no regional wall motion abnormalities. - Aortic valve: There was mild regurgitation directed centrally in the LVOT. - Left atrium: No evidence of thrombus in the atrial cavity or appendage. - Right atrium: No evidence of thrombus in the atrial cavity or appendag Impressions: - There was no evidence of a vegetation. Mild aortic regurgitation was reported on the October 2018 transthoracic study as well.   Neuro/Psych PSYCHIATRIC DISORDERS Anxiety    GI/Hepatic GERD  Medicated,(+)     substance abuse  IV drug use, Hepatitis -, C  Endo/Other  diabetes, Type 1, Insulin DependentHypothyroidism   Renal/GU Renal InsufficiencyRenal disease     Musculoskeletal   Abdominal   Peds  Hematology   Anesthesia Other Findings   Reproductive/Obstetrics                             Anesthesia Physical Anesthesia Plan  ASA: III  Anesthesia Plan: MAC   Post-op Pain Management:    Induction: Intravenous  PONV Risk Score and Plan: 1 and Ondansetron, Midazolam and Propofol infusion  Airway Management Planned: Natural Airway and Simple Face Mask  Additional Equipment:   Intra-op Plan:   Post-operative Plan:   Informed  Consent: I have reviewed the patients History and Physical, chart, labs and discussed the procedure including the risks, benefits and alternatives for the proposed anesthesia with the patient or authorized representative who has indicated his/her understanding and acceptance.     Dental advisory given  Plan Discussed with: CRNA  Anesthesia Plan Comments:        Anesthesia Quick Evaluation

## 2018-09-05 NOTE — H&P (Signed)
Luke [name] is an 38 y.o. male.   Chief Complaint: Abscess/ulceration left foot HPI: The patient is a 38 year old gentleman with poorly controlled type 1 diabetes with peripheral neuropathy and a hemoglobin C of 12.6 who presented with a new ulcer over the dorsum of his left foot. The area was cultured and grew MRSA which was sensitive to Bactrim and he has been on Bactrim for several days but developed increasing pain swelling and purulent drainage and presented with worsening ulceration and presents today for amputation of the first ray.  We will attempt to do this as an outpatient but if there is more extensive infection it was discussed with the patient that he may require further surgery and may require admission for intravenous antibiotic therapy. Past Medical History:  Diagnosis Date  . Anemia   . Anxiety  Dx 2008  . Chronic kidney disease   . Diabetes mellitus without complication ([place]) Dx 9417  . DKA (diabetic ketoacidoses) ([place]) 07/21/2017  . GERD (gastroesophageal reflux disease) Dx 2008  . Headache(784.0)   . Hypertension   . Pneumonia   . Seizures ([place]) 2011    x 2 in lifetime. on Dilantin for a while.   . 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 I&D EXTREMITY Left 10/11/2012   Procedure: IRRIGATION AND DEBRIDEMENT ABSCESS FOREARM;  Surgeon: Linna Hoff, MD;  Location: [facility];  Service: Orthopedics;  Laterality: Left;  . I&D EXTREMITY Left 10/12/2012   Procedure: IRRIGATION AND DEBRIDEMENT FOREARM;  Surgeon: Linna Hoff, MD;  Location: [facility];  Service: Orthopedics;  Laterality: Left;  . I&D EXTREMITY Left 10/14/2012   Procedure: incision and drainage left forearm;  Surgeon: Linna Hoff, MD;  Location: [facility];  Service: Orthopedics;  Laterality: Left;  . I&D EXTREMITY Left 10/16/2012   Procedure: IRRIGATION AND DEBRIDEMENT LEFT FOREARM;  Surgeon: Linna Hoff, MD;  Location: [facility];  Service:  Orthopedics;  Laterality: Left;  . I&D EXTREMITY Left 10/20/2012   Procedure: INCISION AND DRAINAGE AND DEBRIDEMENT LEFT  FOREARM;  Surgeon: Linna Hoff, MD;  Location: [facility];  Service: Orthopedics;  Laterality: Left;  . INCISION AND DRAINAGE OF WOUND Bilateral 11/29/2017   Procedure: DEBRIDEMENT BILATERAL FEET, DEBRIDEMENT LEFT ANKLE, AND APPLY WOUND VAC;  Surgeon: [name], MD;  Location: [facility];  Service: Orthopedics;  Laterality: Bilateral;  . IRRIGATION AND DEBRIDEMENT ABSCESS     Hx: of left arm abscess related to drug use   . TEE WITHOUT CARDIOVERSION N/A 11/28/2017   Procedure: TRANSESOPHAGEAL ECHOCARDIOGRAM (TEE);  Surgeon: Sanda Klein, MD;  Location: [facility] ENDOSCOPY;  Service: Cardiovascular;  Laterality: N/A;    Family History  Problem Relation Age of Onset  . Diabetes Father   . Heart disease Father   . Mental illness Sister   . Cancer Neg Hx    Social History:  reports that he has been smoking cigarettes. He has been smoking about 1.00 pack per day. He has never used smokeless tobacco. He reports previous drug use. Drugs: Cocaine, IV, and Heroin. He reports that he does not drink alcohol.  Allergies: No Known Allergies  No medications prior to admission.    No results found for this or any previous visit (from the past 48 hour(s)). Xr Foot Complete Left  Result Date: 09/04/2018 3 view radiographs of the left foot shows no destructive bony changes there is swelling of the  great toe no air in the subcutaneous tissue.   Review of Systems  All other systems reviewed and are negative.   There were no vitals taken for this visit. Physical Exam  Constitutional: He is oriented to person, place, and time. He appears well-developed and well-nourished. No distress.  HENT:  Head: Normocephalic and atraumatic.  Neck: No tracheal deviation present. No thyromegaly present.  Cardiovascular: Normal rate.  Respiratory: Effort normal. No stridor. No respiratory distress.   GI: Soft. He exhibits no distension.  Musculoskeletal:     Comments: Examination patient has a strong dorsalis pedis pulse he has multiple surgical incisions are well-healed.  He has sausage digit swelling of the left great toe there is purulent drainage dorsally just proximal to the MTP joint as well as purulent drainage in the first webspace.  There is no crepitation in the soft tissue no ascending cellulitis.  Radiographs show no air in the soft tissue no clinical signs of necrotizing fasciitis.  Neurological: He is alert and oriented to person, place, and time. No cranial nerve deficit.  Skin: Skin is warm.  Psychiatric: He has a normal mood and affect. His behavior is normal. Judgment and thought content normal.     Assessment/Plan Left foot and great toe abscess with MRSA- plan left great toe amputation just proximal to the MTP joint-the procedure and possible benefits and risks including the risk of infection, bleeding, neurovascular injury, and possible need for further surgery were discussed with the patient and his questions were answered to his satisfaction.  The patient wishes to proceed with surgery at this time. Erlinda Hong, PA-C 09/05/2018, 7:54 AM [facility] [phone]

## 2018-09-05 NOTE — Op Note (Signed)
09/05/2018  2:16 PM  PATIENT:  Luke [name]    PRE-OPERATIVE DIAGNOSIS:  Left Great Toe Abscess  POST-OPERATIVE DIAGNOSIS:  Same  PROCEDURE:  LEFT GREAT TOE AMPUTATION Local tissue rearrangement for wound closure 4 x 7 cm. Application of Praveena wound VAC.  SURGEON:  [name], MD  PHYSICIAN ASSISTANT:None ANESTHESIA:   General  PREOPERATIVE INDICATIONS:  Luke [name] is a  38 y.o. male with a diagnosis of Left Great Toe Abscess who failed conservative measures and elected for surgical management.    The risks benefits and alternatives were discussed with the patient preoperatively including but not limited to the risks of infection, bleeding, nerve injury, cardiopulmonary complications, the need for revision surgery, among others, and the patient was willing to proceed.  OPERATIVE IMPLANTS: Wound VAC  _0 @  OPERATIVE FINDINGS: Tissue margins were clear without infection  OPERATIVE PROCEDURE: Patient was brought the operating room and underwent a MAC anesthetic.  The left lower extremity was then prepped using DuraPrep draped into a sterile field a timeout was called.  A local anesthesia was performed with 20 cc of quarter percent Marcaine plain.  After adequate levels anesthesia were obtained patient's left ray was amputated with an incision to incorporate the 4 ulcers dorsally over the foot and the first ray was resected through the base.  This was amputated back to healthy viable tissue.  Electrocautery was used for hemostasis the wound was irrigated with normal saline.  The remaining wound was 4 x 7 cm.  Local tissue rearrangement was used to close the wound 4 x 7 cm.  A Praveena wound VAC was applied this had a good suction fit patient was extubated taken the PACU in stable condition.   DISCHARGE PLANNING:  Antibiotic duration: Preoperative antibiotics  Weightbearing: Nonweightbearing on the left  Pain medication: Prescription for Percocet sent to [facility]    Dressing care/ Wound VAC: Continue wound VAC for 1 week  Ambulatory devices: Crutches or walker  Discharge to: Home.  Follow-up: In the office 1 week post operative.

## 2018-09-05 NOTE — Anesthesia Procedure Notes (Signed)
Procedure Name: MAC Date/Time: 09/05/2018 1:05 PM Performed by: Oletta Lamas, CRNA Pre-anesthesia Checklist: Emergency Drugs available, Patient identified, Suction available and Patient being monitored Patient Re-evaluated:Patient Re-evaluated prior to induction Oxygen Delivery Method: Simple face mask

## 2018-09-06 NOTE — Anesthesia Postprocedure Evaluation (Signed)
Anesthesia Post Note  [name]  Procedure(s) Performed: LEFT GREAT TOE AMPUTATION (Left )     Patient location during evaluation: PACU Anesthesia Type: MAC Level of consciousness: awake and alert Pain management: pain level controlled Vital Signs Assessment: post-procedure vital signs reviewed and stable Respiratory status: spontaneous breathing, nonlabored ventilation, respiratory function stable and patient connected to nasal cannula oxygen Cardiovascular status: stable and blood pressure returned to baseline Postop Assessment: no apparent nausea or vomiting Anesthetic complications: no    Last Vitals:  Vitals:   09/05/18 1530 09/05/18 1600  BP: 105/88 129/74  Pulse: 81 80  Resp: 16 18  Temp: (!) 36.1 C   SpO2: 98% 100%    Last Pain:  Vitals:   09/05/18 1515  TempSrc:   PainSc: 5                  Malgorzata Albert L Amaury Kuzel

## 2018-09-07 ENCOUNTER — Encounter (HOSPITAL_COMMUNITY): Payer: Self-pay | Admitting: Orthopedic Surgery

## 2018-09-08 ENCOUNTER — Other Ambulatory Visit: Payer: Self-pay | Admitting: Nurse Practitioner

## 2018-09-08 DIAGNOSIS — E1065 Type 1 diabetes mellitus with hyperglycemia: Secondary | ICD-10-CM

## 2018-09-08 MED FILL — SERTRALINE HCL 50 MG TABS: 50 | 30 days supply | Qty: 30 | Fill #1

## 2018-09-08 MED FILL — LEVOTHYROXINE 50 MCG TABLET: 50 | 30 days supply | Qty: 30 | Fill #1

## 2018-09-10 MED FILL — GABAPENTIN 400 MG CAPSULE: 400 | 30 days supply | Qty: 90 | Fill #0

## 2018-09-11 MED FILL — ?SULFAMETHOXAZOLE-TMP DS TA: 800-160 | 14 days supply | Qty: 28 | Fill #1

## 2018-09-12 ENCOUNTER — Other Ambulatory Visit: Payer: Self-pay

## 2018-09-12 ENCOUNTER — Ambulatory Visit (INDEPENDENT_AMBULATORY_CARE_PROVIDER_SITE_OTHER): Payer: Medicaid Other | Admitting: Physician Assistant

## 2018-09-12 ENCOUNTER — Encounter: Payer: Self-pay | Admitting: Physician Assistant

## 2018-09-12 DIAGNOSIS — L97522 Non-pressure chronic ulcer of other part of left foot with fat layer exposed: Secondary | ICD-10-CM

## 2018-09-12 MED ORDER — OXYCODONE-ACETAMINOPHEN 5-325 MG PO TABS: 1.0000 | ORAL_TABLET | ORAL | 0 refills | Status: DC | PRN

## 2018-09-12 MED ORDER — SULFAMETHOXAZOLE-TRIMETHOPRIM 800-160 MG PO TABS: 1.0000 | ORAL_TABLET | Freq: Two times a day (BID) | ORAL | 1 refills | Status: DC

## 2018-09-12 NOTE — Progress Notes (Signed)
Office Visit Note   [name]           Date of Birth: [birth year]           MRN: [id number] Visit Date: 09/12/2018              Requested by: Gildardo Pounds, NP [address] [place],  [place] [address] PCP: Gildardo Pounds, NP  Chief Complaint  Patient presents with  . Left Foot - Routine Post Op    09/05/2018 GT amputation       HPI: The patient is a 38 yo gentleman who is seen for post operative follow up following left great toe amputation on 09/05/2018. Cultures from 08/29/2018 had grown MRSA and the patient continues on Bactrim DS. He is having moderate to severe pain over the area. He has had the [facility] in place, but it stopped functioning just before his visit to the office today.   Assessment & Plan: Visit Diagnoses:  1. Foot ulcer with fat layer exposed, left ([place])     Plan:The Prevena VAC dressing was removed. The patient and his wife by phone were instructed to begin washing the area daily with Dial soap and water and apply dry gauze dressings to the area daily and to continue to off load and elevate the left foot as much as possible.  Pain medications refilled and refilled bactrim DS as well.  Follow up next week.  Follow-Up Instructions: Return in about 6 days (around 09/18/2018).   Ortho Exam  Patient is alert, oriented, no adenopathy, well-dressed, normal affect, normal respiratory effort. The Prevena VAC is removed and there is moderate maceration over the inferior flap and peeling skin. The sutures are intact over the incision and there is minimal serous drainage and some thick tan drainage between which is cleaned from the area. There is no purulent drainage from the incisional area. He has pain with the dressing change. Good palpable pedal pulses. + odor, but no signs of ongoing cellulitis of the foot. Moderate edema over the foot.   Imaging: No results found. No images are attached to the encounter.  Labs: Lab Results  Component Value  Date   HGBA1C 12.6 (A) 06/24/2018   HGBA1C >15 03/24/2018   HGBA1C 12.1 (H) 11/22/2017   ESRSEDRATE 40 (H) 12/03/2014   CRP 9.0 (H) 11/23/2017   CRP 12.2 (H) 11/22/2017   CRP 8.7 (H) 12/03/2014   REPTSTATUS 12/04/2017 FINAL 11/29/2017   GRAMSTAIN  11/29/2017    FEW WBC PRESENT, PREDOMINANTLY PMN NO ORGANISMS SEEN    CULT  11/29/2017    NO ANAEROBES ISOLATED Performed at [facility], [place] [address], [name], [place] [address]    LABORGA METHICILLIN RESISTANT STAPHYLOCOCCUS AUREUS (A) 12/16/2016     Lab Results  Component Value Date   ALBUMIN 2.5 (L) 09/05/2018   ALBUMIN 2.6 (L) 12/10/2017   ALBUMIN 2.6 (L) 12/04/2017    Body mass index is 28.8 kg/m.  Orders:  No orders of the defined types were placed in this encounter.  Meds ordered this encounter  Medications  . oxyCODONE-acetaminophen (PERCOCET) 5-325 MG tablet    Sig: Take 1 tablet by mouth every 4 (four) hours as needed for moderate pain or severe pain.    Dispense:  30 tablet    Refill:  0  . sulfamethoxazole-trimethoprim (BACTRIM DS) 800-160 MG tablet    Sig: Take 1 tablet by mouth 2 (two) times daily.    Dispense:  28 tablet    Refill:  1     Procedures: No procedures performed  Clinical Data: No additional findings.  ROS:  All other systems negative, except as noted in the HPI. Review of Systems  Objective: Vital Signs: Ht 5\' 9"  (1.753 m)   Wt 195 lb (88.5 kg)   BMI 28.80 kg/m   Specialty Comments:  No specialty comments available.  PMFS History: Patient Active Problem List   Diagnosis Date Noted  . Subacute osteomyelitis, left ankle and foot ([place])   . Cutaneous abscess of left foot 09/04/2018  . Diabetic ulcer of left foot ([place]) 08/28/2018  . Acute hematogenous osteomyelitis, left ankle and foot ([place])   . Streptococcal bacteremia 11/25/2017  . Hepatitis C 11/25/2017  . Actinomyces infection   . Acute renal failure superimposed on stage 2 chronic kidney disease ([place])  07/21/2017  . GERD (gastroesophageal reflux disease) 07/21/2017  . Diabetic polyneuropathy associated with type 1 diabetes mellitus ([place])   . Prostatitis, acute   . MRSA infection   . Noncompliant bladder   . Urinary retention   . Prostate abscess   . ARF (acute renal failure) ([place]) 12/16/2016  . Anemia 12/16/2016  . Cocaine abuse ([place]) 08/16/2015  . Uncontrolled insulin dependent type 1 diabetes mellitus ([place]) 08/16/2015  . AKI (acute kidney injury) ([place]) 12/03/2014  . Elevated liver enzymes 07/26/2014  . Dental caries 07/23/2014  . Tinea pedis 07/23/2014  . DKA (diabetic ketoacidoses) ([place]) 11/23/2013  . Abscess 10/14/2012  . 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 Tobacco abuse 10/11/2012  . Essential hypertension 10/11/2012   Past Medical History:  Diagnosis Date  . Anemia   . Anxiety  Dx 2008  . Chronic kidney disease   . Diabetes mellitus without complication ([place]) Dx 7371  . DKA (diabetic ketoacidoses) ([place]) 07/21/2017  . GERD (gastroesophageal reflux disease) Dx 2008  . Headache(784.0)   . Hypertension   . Pneumonia   . Seizures ([place]) 2011    x 2 in lifetime. on Dilantin for a while.   . 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 Diabetes Father   . Heart disease Father   . Mental illness Sister   . Cancer Neg Hx     Past Surgical History:  Procedure Laterality Date  . AMPUTATION Left 09/05/2018   Procedure: LEFT GREAT TOE AMPUTATION;  Surgeon: [name], MD;  Location: [facility];  Service: Orthopedics;  Laterality: Left;  . I&D EXTREMITY Left 10/11/2012   Procedure: IRRIGATION AND DEBRIDEMENT ABSCESS FOREARM;  Surgeon: Linna Hoff, MD;  Location: [facility];  Service: Orthopedics;  Laterality: Left;  . I&D EXTREMITY Left 10/12/2012   Procedure: IRRIGATION AND DEBRIDEMENT FOREARM;  Surgeon: Linna Hoff, MD;  Location: [facility];  Service: Orthopedics;  Laterality: Left;  . I&D EXTREMITY Left  10/14/2012   Procedure: incision and drainage left forearm;  Surgeon: Linna Hoff, MD;  Location: [facility];  Service: Orthopedics;  Laterality: Left;  . I&D EXTREMITY Left 10/16/2012   Procedure: IRRIGATION AND DEBRIDEMENT LEFT FOREARM;  Surgeon: Linna Hoff, MD;  Location: [facility];  Service: Orthopedics;  Laterality: Left;  . I&D EXTREMITY Left 10/20/2012   Procedure: INCISION AND DRAINAGE AND DEBRIDEMENT LEFT  FOREARM;  Surgeon: Linna Hoff, MD;  Location: [facility];  Service: Orthopedics;  Laterality: Left;  . INCISION AND DRAINAGE OF WOUND Bilateral 11/29/2017  Procedure: DEBRIDEMENT BILATERAL FEET, DEBRIDEMENT LEFT ANKLE, AND APPLY WOUND VAC;  Surgeon: [name], MD;  Location: [facility];  Service: Orthopedics;  Laterality: Bilateral;  . IRRIGATION AND DEBRIDEMENT ABSCESS     Hx: of left arm abscess related to drug use   . TEE WITHOUT CARDIOVERSION N/A 11/28/2017   Procedure: TRANSESOPHAGEAL ECHOCARDIOGRAM (TEE);  Surgeon: Sanda Klein, MD;  Location: [facility];  Service: Cardiovascular;  Laterality: N/A;   Social History   Occupational History  . Occupation: Event set up    Employer: [employer]   . Occupation: Stencil for cars   Tobacco Use  . Smoking status: Current Every Day Smoker    Packs/day: 1.00    Types: Cigarettes  . Smokeless tobacco: Never Used  Substance and Sexual Activity  . Alcohol use: No    Alcohol/week: 0.0 standard drinks    Comment: occasional  . Drug use: Not Currently    Types: Cocaine, IV, Heroin    Comment: IVDU since Dad died in 13-Jun-2006 with 15 month period of sobriety 2018-2019, relapse Aug 2019.  Marland Kitchen Sexual activity: Yes    Partners: Female    Birth control/protection: None

## 2018-09-18 ENCOUNTER — Ambulatory Visit (INDEPENDENT_AMBULATORY_CARE_PROVIDER_SITE_OTHER): Payer: Self-pay | Admitting: Orthopedic Surgery

## 2018-09-18 ENCOUNTER — Encounter: Payer: Self-pay | Admitting: Physician Assistant

## 2018-09-18 ENCOUNTER — Other Ambulatory Visit: Payer: Self-pay

## 2018-09-18 VITALS — Ht 69.0 in | Wt 195.0 lb

## 2018-09-18 DIAGNOSIS — Z89412 Acquired absence of left great toe: Secondary | ICD-10-CM

## 2018-09-18 DIAGNOSIS — I96 Gangrene, not elsewhere classified: Secondary | ICD-10-CM

## 2018-09-18 DIAGNOSIS — F17200 Nicotine dependence, unspecified, uncomplicated: Secondary | ICD-10-CM

## 2018-09-18 DIAGNOSIS — E1042 Type 1 diabetes mellitus with diabetic polyneuropathy: Secondary | ICD-10-CM

## 2018-09-18 MED ORDER — NITROGLYCERIN 0.2 MG/HR TD PT24: 0.2000 mg | MEDICATED_PATCH | Freq: Every day | TRANSDERMAL | 12 refills | Status: DC

## 2018-09-18 MED ORDER — PENTOXIFYLLINE ER 400 MG PO TBCR: 400.0000 mg | EXTENDED_RELEASE_TABLET | Freq: Three times a day (TID) | ORAL | 3 refills | Status: DC

## 2018-09-18 MED ORDER — OXYCODONE-ACETAMINOPHEN 5-325 MG PO TABS: 1.0000 | ORAL_TABLET | ORAL | 0 refills | Status: DC | PRN

## 2018-09-18 NOTE — Progress Notes (Signed)
Office Visit Note   [name]           Date of Birth: [birth year]           MRN: [id number] Visit Date: 09/18/2018              Requested by: Gildardo Pounds, NP [address] [place],  [place] [address] PCP: Gildardo Pounds, NP  Chief Complaint  Patient presents with  . Left Foot - Routine Post Op    09/05/18 left GT amp       HPI: Patient is a 38 year old gentleman who presents status post left great toe first ray amputation.  He still is taking sulfamethoxazole trimethoprim.  He states he does have some drainage some ischemic changes with associated pain.  Patient states that he is still smoking a pack a day.  Assessment & Plan: Visit Diagnoses:  1. Diabetic polyneuropathy associated with type 1 diabetes mellitus ([place])   2. 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 Gangrene of left foot ([place])   4. Smoker     Plan: Plan: Discussed the critical nature of stopping smoking.  Discussed that he has good circulation to the ankle but the microscopic circulation is affected by both the diabetes and the smoking.  Discussed that without smoking cessation he most likely would require a transtibial amputation.  We will call in a prescription for Trental to help with microcirculation as well as a prescription for nitroglycerin patch.  Follow-Up Instructions: Return in about 1 week (around 09/25/2018).   Ortho Exam  Patient is alert, oriented, no adenopathy, well-dressed, normal affect, normal respiratory effort. Examination patient has a strong dorsalis pedis and posterior tibial pulse.  He has black ischemic changes along the surgical incision the wound edges are well approximated there is no cellulitis no drainage no signs of infection.  Imaging: No results found. No images are attached to the encounter.  Labs: Lab Results  Component Value Date   HGBA1C 12.6 (A) 06/24/2018   HGBA1C >15 03/24/2018   HGBA1C 12.1 (H) 11/22/2017   ESRSEDRATE 40  (H) 12/03/2014   CRP 9.0 (H) 11/23/2017   CRP 12.2 (H) 11/22/2017   CRP 8.7 (H) 12/03/2014   REPTSTATUS 12/04/2017 FINAL 11/29/2017   GRAMSTAIN  11/29/2017    FEW WBC PRESENT, PREDOMINANTLY PMN NO ORGANISMS SEEN    CULT  11/29/2017    NO ANAEROBES ISOLATED Performed at [facility], [place] [address], [place], [place] [address]    LABORGA METHICILLIN RESISTANT STAPHYLOCOCCUS AUREUS (A) 12/16/2016     Lab Results  Component Value Date   ALBUMIN 2.5 (L) 09/05/2018   ALBUMIN 2.6 (L) 12/10/2017   ALBUMIN 2.6 (L) 12/04/2017    Lab Results  Component Value Date   MG 1.8 12/10/2017   MG 1.7 11/24/2013   No results found for: VD25OH  No results found for: PREALBUMIN CBC EXTENDED Latest Ref Rng & Units 09/05/2018 09/05/2018 12/10/2017  WBC 4.0 - 10.5 K/uL - 18.9(H) 10.0  RBC 4.22 - 5.81 MIL/uL - 3.81(L) 3.46(L)  HGB 13.0 - 17.0 g/dL 9.5(L) 9.4(L) 9.6(L)  HCT 39.0 - 52.0 % 28.0(L) 29.8(L) 30.2(L)  PLT 150 - 400 K/uL - 604(H) 428(H)  NEUTROABS 1.7 - 7.7 K/uL - - -  LYMPHSABS 0.7 - 4.0 K/uL - - -     Body mass index is 28.8 kg/m.  Orders:  No orders of the defined types were placed in this  encounter.  Meds ordered this encounter  Medications  . nitroGLYCERIN (NITRODUR - DOSED IN MG/24 HR) 0.2 mg/hr patch    Sig: Place 1 patch (0.2 mg total) onto the skin daily.    Dispense:  30 patch    Refill:  12  . pentoxifylline (TRENTAL) 400 MG CR tablet    Sig: Take 1 tablet (400 mg total) by mouth 3 (three) times daily with meals.    Dispense:  90 tablet    Refill:  3     Procedures: No procedures performed  Clinical Data: No additional findings.  ROS:  All other systems negative, except as noted in the HPI. Review of Systems  Objective: Vital Signs: Ht 5\' 9"  (1.753 m)   Wt 195 lb (88.5 kg)   BMI 28.80 kg/m   Specialty Comments:  No specialty comments available.  PMFS History: Patient Active Problem List   Diagnosis Date Noted  . Subacute osteomyelitis,  left ankle and foot ([place])   . Cutaneous abscess of left foot 09/04/2018  . Diabetic ulcer of left foot ([place]) 08/28/2018  . Acute hematogenous osteomyelitis, left ankle and foot ([place])   . Streptococcal bacteremia 11/25/2017  . Hepatitis C 11/25/2017  . Actinomyces infection   . Acute renal failure superimposed on stage 2 chronic kidney disease ([place]) 07/21/2017  . GERD (gastroesophageal reflux disease) 07/21/2017  . Diabetic polyneuropathy associated with type 1 diabetes mellitus ([place])   . Prostatitis, acute   . MRSA infection   . Noncompliant bladder   . Urinary retention   . Prostate abscess   . ARF (acute renal failure) ([place]) 12/16/2016  . Anemia 12/16/2016  . Cocaine abuse ([place]) 08/16/2015  . Uncontrolled insulin dependent type 1 diabetes mellitus ([place]) 08/16/2015  . AKI (acute kidney injury) ([place]) 12/03/2014  . Elevated liver enzymes 07/26/2014  . Dental caries 07/23/2014  . Tinea pedis 07/23/2014  . DKA (diabetic ketoacidoses) ([place]) 11/23/2013  . Abscess 10/14/2012  . 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 Tobacco abuse 10/11/2012  . Essential hypertension 10/11/2012   Past Medical History:  Diagnosis Date  . Anemia   . Anxiety  Dx 2008  . Chronic kidney disease   . Diabetes mellitus without complication ([place]) Dx 7124  . DKA (diabetic ketoacidoses) ([place]) 07/21/2017  . GERD (gastroesophageal reflux disease) Dx 2008  . Headache(784.0)   . Hypertension   . Pneumonia   . Seizures ([place]) 2011    x 2 in lifetime. on Dilantin for a while.   . 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 Diabetes Father   . Heart disease Father   . Mental illness Sister   . Cancer Neg Hx     Past Surgical History:  Procedure Laterality Date  . AMPUTATION Left 09/05/2018   Procedure: LEFT GREAT TOE AMPUTATION;  Surgeon: [name], MD;  Location: [facility];  Service: Orthopedics;  Laterality: Left;  . I&D EXTREMITY Left  10/11/2012   Procedure: IRRIGATION AND DEBRIDEMENT ABSCESS FOREARM;  Surgeon: Linna Hoff, MD;  Location: [facility];  Service: Orthopedics;  Laterality: Left;  . I&D EXTREMITY Left 10/12/2012   Procedure: IRRIGATION AND DEBRIDEMENT FOREARM;  Surgeon: Linna Hoff, MD;  Location: [facility];  Service: Orthopedics;  Laterality: Left;  . I&D EXTREMITY Left 10/14/2012   Procedure: incision and drainage left forearm;  Surgeon: Linna Hoff, MD;  Location: [facility];  Service:  Orthopedics;  Laterality: Left;  . I&D EXTREMITY Left 10/16/2012   Procedure: IRRIGATION AND DEBRIDEMENT LEFT FOREARM;  Surgeon: Linna Hoff, MD;  Location: [facility];  Service: Orthopedics;  Laterality: Left;  . I&D EXTREMITY Left 10/20/2012   Procedure: INCISION AND DRAINAGE AND DEBRIDEMENT LEFT  FOREARM;  Surgeon: Linna Hoff, MD;  Location: [facility];  Service: Orthopedics;  Laterality: Left;  . INCISION AND DRAINAGE OF WOUND Bilateral 11/29/2017   Procedure: DEBRIDEMENT BILATERAL FEET, DEBRIDEMENT LEFT ANKLE, AND APPLY WOUND VAC;  Surgeon: [name], MD;  Location: [facility];  Service: Orthopedics;  Laterality: Bilateral;  . IRRIGATION AND DEBRIDEMENT ABSCESS     Hx: of left arm abscess related to drug use   . TEE WITHOUT CARDIOVERSION N/A 11/28/2017   Procedure: TRANSESOPHAGEAL ECHOCARDIOGRAM (TEE);  Surgeon: Sanda Klein, MD;  Location: [facility];  Service: Cardiovascular;  Laterality: N/A;   Social History   Occupational History  . Occupation: Event set up    Employer: [employer]   . Occupation: Stencil for cars   Tobacco Use  . Smoking status: Current Every Day Smoker    Packs/day: 1.00    Types: Cigarettes  . Smokeless tobacco: Never Used  Substance and Sexual Activity  . Alcohol use: No    Alcohol/week: 0.0 standard drinks    Comment: occasional  . Drug use: Not Currently    Types: Cocaine, IV, Heroin    Comment: IVDU since Dad died in 06-22-06 with 15 month period of sobriety 2018-2019,  relapse Aug 2019.  Marland Kitchen Sexual activity: Yes    Partners: Female    Birth control/protection: None

## 2018-09-25 ENCOUNTER — Encounter: Payer: Self-pay | Admitting: Orthopedic Surgery

## 2018-09-25 ENCOUNTER — Other Ambulatory Visit: Payer: Self-pay

## 2018-09-25 ENCOUNTER — Ambulatory Visit (INDEPENDENT_AMBULATORY_CARE_PROVIDER_SITE_OTHER): Payer: Self-pay | Admitting: Orthopedic Surgery

## 2018-09-25 VITALS — Ht 69.0 in | Wt 195.0 lb

## 2018-09-25 DIAGNOSIS — Z89412 Acquired absence of left great toe: Secondary | ICD-10-CM

## 2018-09-25 DIAGNOSIS — E1042 Type 1 diabetes mellitus with diabetic polyneuropathy: Secondary | ICD-10-CM

## 2018-09-25 MED FILL — TRUE METRIX TEST STRIP: 30 days supply | Qty: 100 | Fill #3

## 2018-09-25 MED FILL — LISINOPRIL 20 MG TABLET: 20 | 30 days supply | Qty: 30 | Fill #2

## 2018-09-25 NOTE — Progress Notes (Signed)
Office Visit Note   [name]           Date of Birth: November 14, 1980           MRN: [id number] Visit Date: 09/25/2018              Requested by: Gildardo Pounds, NP [address] [place],  [place] [address] PCP: Gildardo Pounds, NP  Chief Complaint  Patient presents with  . Left Foot - Routine Post Op    09/05/18 left foot GT amp      HPI: Patient is a 38 year old gentleman who presents in follow-up status post left foot first ray amputation.  Patient is currently on Trental Bactrim DS as well as a nitroglycerin patch.  Assessment & Plan: Visit Diagnoses:  1. Diabetic polyneuropathy associated with type 1 diabetes mellitus ([place])   2. 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  Discussed the importance of smoking cessation elevation minimize weightbearing continue with the antibiotics the current Tylenol and the antibiotics.  Follow-Up Instructions: Return in about 2 weeks (around 10/09/2018).   Ortho Exam  Patient is alert, oriented, no adenopathy, well-dressed, normal affect, normal respiratory effort. Examination the incision is showing improvement there is a small eschar with good granulation tissue there is no depth to the wound no exposed bone or tendon there is no cellulitis no signs of infection there is no swelling of the second toe.  Wound edges are well approximated.  Imaging: No results found. No images are attached to the encounter.  Labs: Lab Results  Component Value Date   HGBA1C 12.6 (A) 06/24/2018   HGBA1C >15 03/24/2018   HGBA1C 12.1 (H) 11/22/2017   ESRSEDRATE 40 (H) 12/03/2014   CRP 9.0 (H) 11/23/2017   CRP 12.2 (H) 11/22/2017   CRP 8.7 (H) 12/03/2014   REPTSTATUS 12/04/2017 FINAL 11/29/2017   GRAMSTAIN  11/29/2017    FEW WBC PRESENT, PREDOMINANTLY PMN NO ORGANISMS SEEN    CULT  11/29/2017    NO ANAEROBES ISOLATED Performed  at [facility], [place] [address] [place], [place] [address]    LABORGA METHICILLIN RESISTANT STAPHYLOCOCCUS AUREUS (A) 12/16/2016     Lab Results  Component Value Date   ALBUMIN 2.5 (L) 09/05/2018   ALBUMIN 2.6 (L) 12/10/2017   ALBUMIN 2.6 (L) 12/04/2017    Lab Results  Component Value Date   MG 1.8 12/10/2017   MG 1.7 11/24/2013   No results found for: VD25OH  No results found for: PREALBUMIN CBC EXTENDED Latest Ref Rng & Units 09/05/2018 09/05/2018 12/10/2017  WBC 4.0 - 10.5 K/uL - 18.9(H) 10.0  RBC 4.22 - 5.81 MIL/uL - 3.81(L) 3.46(L)  HGB 13.0 - 17.0 g/dL 9.5(L) 9.4(L) 9.6(L)  HCT 39.0 - 52.0 % 28.0(L) 29.8(L) 30.2(L)  PLT 150 - 400 K/uL - 604(H) 428(H)  NEUTROABS 1.7 - 7.7 K/uL - - -  LYMPHSABS 0.7 - 4.0 K/uL - - -     Body mass index is 28.8 kg/m.  Orders:  No orders of the defined types were placed in this encounter.  No orders of the defined types were placed in this encounter.    Procedures: No procedures performed  Clinical Data: No additional findings.  ROS:  All other systems negative, except as noted in the HPI. Review of Systems  Objective: Vital Signs: Ht 5\' 9"  (1.753 m)  Wt 195 lb (88.5 kg)   BMI 28.80 kg/m   Specialty Comments:  No specialty comments available.  PMFS History: Patient Active Problem List   Diagnosis Date Noted  . Subacute osteomyelitis, left ankle and foot ([place])   . Cutaneous abscess of left foot 09/04/2018  . Diabetic ulcer of left foot ([place]) 08/28/2018  . Acute hematogenous osteomyelitis, left ankle and foot ([place])   . Streptococcal bacteremia 11/25/2017  . Hepatitis C 11/25/2017  . Actinomyces infection   . Acute renal failure superimposed on stage 2 chronic kidney disease ([place]) 07/21/2017  . GERD (gastroesophageal reflux disease) 07/21/2017  . Diabetic polyneuropathy associated with type 1 diabetes mellitus ([place])   . Prostatitis, acute   . MRSA infection   . Noncompliant bladder   . Urinary retention    . Prostate abscess   . ARF (acute renal failure) ([place]) 12/16/2016  . Anemia 12/16/2016  . Cocaine abuse ([place]) 08/16/2015  . Uncontrolled insulin dependent type 1 diabetes mellitus ([place]) 08/16/2015  . AKI (acute kidney injury) ([place]) 12/03/2014  . Elevated liver enzymes 07/26/2014  . Dental caries 07/23/2014  . Tinea pedis 07/23/2014  . DKA (diabetic ketoacidoses) ([place]) 11/23/2013  . Abscess 10/14/2012  . 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 Tobacco abuse 10/11/2012  . Essential hypertension 10/11/2012   Past Medical History:  Diagnosis Date  . Anemia   . Anxiety  Dx 2008  . Chronic kidney disease   . Diabetes mellitus without complication ([place]) Dx 3235  . DKA (diabetic ketoacidoses) ([place]) 07/21/2017  . GERD (gastroesophageal reflux disease) Dx 2008  . Headache(784.0)   . Hypertension   . Pneumonia   . Seizures ([place]) 2011    x 2 in lifetime. on Dilantin for a while.   . 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 Diabetes Father   . Heart disease Father   . Mental illness Sister   . Cancer Neg Hx     Past Surgical History:  Procedure Laterality Date  . AMPUTATION Left 09/05/2018   Procedure: LEFT GREAT TOE AMPUTATION;  Surgeon: [name], MD;  Location: [facility];  Service: Orthopedics;  Laterality: Left;  . I&D EXTREMITY Left 10/11/2012   Procedure: IRRIGATION AND DEBRIDEMENT ABSCESS FOREARM;  Surgeon: Linna Hoff, MD;  Location: [facility];  Service: Orthopedics;  Laterality: Left;  . I&D EXTREMITY Left 10/12/2012   Procedure: IRRIGATION AND DEBRIDEMENT FOREARM;  Surgeon: Linna Hoff, MD;  Location: [facility];  Service: Orthopedics;  Laterality: Left;  . I&D EXTREMITY Left 10/14/2012   Procedure: incision and drainage left forearm;  Surgeon: Linna Hoff, MD;  Location: [facility];  Service: Orthopedics;  Laterality: Left;  . I&D EXTREMITY Left 10/16/2012   Procedure: IRRIGATION AND DEBRIDEMENT LEFT FOREARM;   Surgeon: Linna Hoff, MD;  Location: [facility];  Service: Orthopedics;  Laterality: Left;  . I&D EXTREMITY Left 10/20/2012   Procedure: INCISION AND DRAINAGE AND DEBRIDEMENT LEFT  FOREARM;  Surgeon: Linna Hoff, MD;  Location: [facility];  Service: Orthopedics;  Laterality: Left;  . INCISION AND DRAINAGE OF WOUND Bilateral 11/29/2017   Procedure: DEBRIDEMENT BILATERAL FEET, DEBRIDEMENT LEFT ANKLE, AND APPLY WOUND VAC;  Surgeon: [name], MD;  Location: [facility];  Service: Orthopedics;  Laterality: Bilateral;  . IRRIGATION AND DEBRIDEMENT ABSCESS     Hx: of left arm abscess related to drug use   .  TEE WITHOUT CARDIOVERSION N/A 11/28/2017   Procedure: TRANSESOPHAGEAL ECHOCARDIOGRAM (TEE);  Surgeon: Sanda Klein, MD;  Location: [facility]COPY;  Service: Cardiovascular;  Laterality: N/A;   Social History   Occupational History  . Occupation: Event set up    Employer: [employer]   . Occupation: Stencil for cars   Tobacco Use  . Smoking status: Current Every Day Smoker    Packs/day: 1.00    Types: Cigarettes  . Smokeless tobacco: Never Used  Substance and Sexual Activity  . Alcohol use: No    Alcohol/week: 0.0 standard drinks    Comment: occasional  . Drug use: Not Currently    Types: Cocaine, IV, Heroin    Comment: IVDU since Dad died in 06-Jul-2006 with 15 month period of sobriety 2018-2019, relapse Aug 2019.  Marland Kitchen Sexual activity: Yes    Partners: Female    Birth control/protection: None

## 2018-09-29 ENCOUNTER — Ambulatory Visit: Payer: Medicaid Other | Admitting: Nurse Practitioner

## 2018-10-01 ENCOUNTER — Other Ambulatory Visit: Payer: Self-pay

## 2018-10-01 ENCOUNTER — Ambulatory Visit: Payer: Self-pay | Attending: Nurse Practitioner | Admitting: Nurse Practitioner

## 2018-10-01 ENCOUNTER — Encounter: Payer: Self-pay | Admitting: Nurse Practitioner

## 2018-10-01 VITALS — BP 123/75 | HR 107 | Temp 99.0°F | Ht 69.0 in | Wt 181.0 lb

## 2018-10-01 DIAGNOSIS — E1065 Type 1 diabetes mellitus with hyperglycemia: Secondary | ICD-10-CM

## 2018-10-01 DIAGNOSIS — N182 Chronic kidney disease, stage 2 (mild): Secondary | ICD-10-CM

## 2018-10-01 DIAGNOSIS — I10 Essential (primary) hypertension: Secondary | ICD-10-CM

## 2018-10-01 DIAGNOSIS — D649 Anemia, unspecified: Secondary | ICD-10-CM

## 2018-10-01 DIAGNOSIS — E875 Hyperkalemia: Secondary | ICD-10-CM

## 2018-10-01 DIAGNOSIS — F17200 Nicotine dependence, unspecified, uncomplicated: Secondary | ICD-10-CM

## 2018-10-01 DIAGNOSIS — IMO0002 Reserved for concepts with insufficient information to code with codable children: Secondary | ICD-10-CM

## 2018-10-01 DIAGNOSIS — F17210 Nicotine dependence, cigarettes, uncomplicated: Secondary | ICD-10-CM

## 2018-10-01 LAB — POCT GLYCOSYLATED HEMOGLOBIN (HGB A1C): Hemoglobin A1C: 12.7 % — AB (ref 4.0–5.6)

## 2018-10-01 LAB — GLUCOSE, POCT (MANUAL RESULT ENTRY): POC Glucose: 164 mg/dl — AB (ref 70–99)

## 2018-10-01 MED ORDER — AMLODIPINE BESYLATE 5 MG PO TABS: 5.0000 mg | ORAL_TABLET | Freq: Every day | ORAL | 3 refills | Status: DC

## 2018-10-01 MED ORDER — DULOXETINE HCL 30 MG PO CPEP: 30.0000 mg | ORAL_CAPSULE | Freq: Every day | ORAL | 3 refills | Status: DC

## 2018-10-01 MED ORDER — SODIUM POLYSTYRENE SULFONATE 15 GM/60ML PO SUSP: 15.0000 g | Freq: Once | ORAL | 0 refills | Status: AC

## 2018-10-01 MED ORDER — NICOTINE 14 MG/24HR TD PT24: 14.0000 mg | MEDICATED_PATCH | Freq: Every day | TRANSDERMAL | 0 refills | Status: AC

## 2018-10-01 MED ORDER — NICOTINE 7 MG/24HR TD PT24: 7.0000 mg | MEDICATED_PATCH | Freq: Every day | TRANSDERMAL | 0 refills | Status: AC

## 2018-10-01 MED ORDER — NICOTINE 21 MG/24HR TD PT24: 21.0000 mg | MEDICATED_PATCH | Freq: Every day | TRANSDERMAL | 0 refills | Status: AC

## 2018-10-01 MED ORDER — LISINOPRIL 10 MG PO TABS: 10.0000 mg | ORAL_TABLET | Freq: Every day | ORAL | 1 refills | Status: DC

## 2018-10-01 MED FILL — SPS 15 GM/60 ML SUSPENSION: 15 | 1 days supply | Qty: 60 | Fill #0

## 2018-10-01 MED FILL — AMLODIPINE BESYLATE 5 MG TA: 5 | 30 days supply | Qty: 30 | Fill #0

## 2018-10-01 MED FILL — DULoxetine HCL 30 MG CPEP: 30 | 30 days supply | Qty: 30 | Fill #0

## 2018-10-01 MED FILL — LISINOPRIL 10 MG TABS: 10 | 30 days supply | Qty: 30 | Fill #0

## 2018-10-01 MED FILL — NICOTINE 21 MG/24HR PATCH: 21 | 28 days supply | Qty: 28 | Fill #0

## 2018-10-01 NOTE — Progress Notes (Signed)
m .  Assessment & Plan:  Luke [name] was seen today for follow-up.  Diagnoses and all orders for this visit:  Uncontrolled insulin dependent type 1 diabetes mellitus ([name]) -     Glucose (CBG) -     HgB A1c -     Ambulatory referral to Endocrinology -     DULoxetine (CYMBALTA) 30 MG capsule; Take 1 capsule (30 mg total) by mouth daily. Continue blood sugar control as discussed in office today, low carbohydrate diet, and regular physical exercise as tolerated, 150 minutes per week (30 min each day, 5 days per week, or 50 min 3 days per week). Keep blood sugar logs with fasting goal of 90-130 mg/dl, post prandial (after you eat) less than 180.  For Hypoglycemia: BS <60 and Hyperglycemia BS >400; contact the clinic ASAP. Annual eye exams and foot exams are recommended.  Diabetes is poorly controlled. Advised patient to keep a fasting blood sugar log fast, 2 hours post lunch and bedtime which will be reviewed at the next office visit.  Essential hypertension -     amLODipine (NORVASC) 5 MG tablet; Take 1 tablet (5 mg total) by mouth daily. -     lisinopril (ZESTRIL) 10 MG tablet; Take 1 tablet (10 mg total) by mouth daily. Continue all antihypertensives as prescribed.  Remember to bring in your blood pressure log with you for your follow up appointment.  DASH/Mediterranean Diets are healthier choices for HTN.   Anemia, unspecified type -     CBC  Stage 2 chronic kidney disease -     CMP14+EGFR  Tobacco dependence -     nicotine (NICODERM CQ - DOSED IN MG/24 HOURS) 14 mg/24hr patch; Place 1 patch (14 mg total) onto the skin daily. -     nicotine (NICODERM CQ - DOSED IN MG/24 HR) 7 mg/24hr patch; Place 1 patch (7 mg total) onto the skin daily for 28 days. -     nicotine (NICODERM CQ - DOSED IN MG/24 HOURS) 21 mg/24hr patch; Place 1 patch (21 mg total) onto the skin daily.  Hyperkalemia -     sodium polystyrene (KAYEXALATE) 15 GM/60ML suspension; Take 60 mLs (15 g total) by mouth once for 1  dose.  Patient left after lab personnel was unsuccessful with one attempt trying to obtain his blood work. 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 He declines going to [facility] for his blood work.     Patient has been counseled on age-appropriate routine health concerns for screening and prevention. These are reviewed and up-to-date. Referrals have been placed accordingly. Immunizations are up-to-date or declined.    Subjective:   Chief Complaint  Patient presents with  . Follow-up    Follow up diabetes.    HPI Luke [name] 38 y.o. male presents to office today for follow up to poorly controlled DM TYPE 1, HTN and HPL.     has a past medical history of Anemia, Anxiety ( Dx 2008), Chronic kidney disease, Diabetes mellitus without complication ([place]) (Dx 4782), DKA (diabetic ketoacidoses) ([place]) (07/21/2017), GERD (gastroesophageal reflux disease) (Dx 2008), Headache(784.0), Hypertension, Pneumonia, Seizures ([place]) (2011 ), and Substance abuse ([place]) (2013 ).  Seeing Dr. Sharol Given for post left foot first ray amputation. He has required bilateral debridement in the past of LE due to poor wound healing.   DM TYPE 2 Poorly controlled. He endorses medication compliance however he does not consistently administer the same doses of lantus or  novolog daily. I have instructed him to try to administer the medications as prescribed as this will help better determine if his medications need to be adjusted or if other medications need to be added. He needs to see an Endocrinologist. I have encouraged him on numerous occasions to apply for the financial assistance. States he hopes for his wife to add him on to her insurance later this year. Prescribed an ACE and Statin Lab Results  Component Value Date   HGBA1C 12.7 (A) 10/01/2018   Lab Results  Component Value Date   HGBA1C 12.6 (A) 06/24/2018    Essential Hypertension Chronic and well controlled. Currently  taking lisinopril 20 mg daily as prescribed. Denies chest pain, shortness of breath, palpitations, lightheadedness, dizziness, headaches or BLE edema.  BP Readings from Last 3 Encounters:  10/01/18 123/75  09/05/18 129/74  06/24/18 (!) 156/107   Review of Systems  Constitutional: Negative for fever, malaise/fatigue and weight loss.  HENT: Negative.  Negative for nosebleeds.   Eyes: Negative.  Negative for blurred vision, double vision and photophobia.  Respiratory: Negative.  Negative for cough and shortness of breath.   Cardiovascular: Negative.  Negative for chest pain, palpitations and leg swelling.  Gastrointestinal: Positive for heartburn. Negative for nausea and vomiting.  Musculoskeletal: Positive for joint pain. Negative for myalgias.  Neurological: Negative.  Negative for dizziness, focal weakness, seizures and headaches.  Psychiatric/Behavioral: Positive for substance abuse. Negative for suicidal ideas. The patient is nervous/anxious.     Past Medical History:  Diagnosis Date  . Anemia   . Anxiety  Dx 2008  . Chronic kidney disease   . Diabetes mellitus without complication ([place]) Dx 0109  . DKA (diabetic ketoacidoses) ([place]) 07/21/2017  . GERD (gastroesophageal reflux disease) Dx 2008  . Headache(784.0)   . Hypertension   . Pneumonia   . Seizures ([place]) 2011    x 2 in lifetime. on Dilantin for a while.   . 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 AMPUTATION Left 09/05/2018   Procedure: LEFT GREAT TOE AMPUTATION;  Surgeon: [name], MD;  Location: [facility];  Service: Orthopedics;  Laterality: Left;  . I&D EXTREMITY Left 10/11/2012   Procedure: IRRIGATION AND DEBRIDEMENT ABSCESS FOREARM;  Surgeon: Linna Hoff, MD;  Location: [facility];  Service: Orthopedics;  Laterality: Left;  . I&D EXTREMITY Left 10/12/2012   Procedure: IRRIGATION AND DEBRIDEMENT FOREARM;  Surgeon: Linna Hoff, MD;   Location: [facility];  Service: Orthopedics;  Laterality: Left;  . I&D EXTREMITY Left 10/14/2012   Procedure: incision and drainage left forearm;  Surgeon: Linna Hoff, MD;  Location: [facility];  Service: Orthopedics;  Laterality: Left;  . I&D EXTREMITY Left 10/16/2012   Procedure: IRRIGATION AND DEBRIDEMENT LEFT FOREARM;  Surgeon: Linna Hoff, MD;  Location: [facility];  Service: Orthopedics;  Laterality: Left;  . I&D EXTREMITY Left 10/20/2012   Procedure: INCISION AND DRAINAGE AND DEBRIDEMENT LEFT  FOREARM;  Surgeon: Linna Hoff, MD;  Location: [facility];  Service: Orthopedics;  Laterality: Left;  . INCISION AND DRAINAGE OF WOUND Bilateral 11/29/2017   Procedure: DEBRIDEMENT BILATERAL FEET, DEBRIDEMENT LEFT ANKLE, AND APPLY WOUND VAC;  Surgeon: [name], MD;  Location: [facility];  Service: Orthopedics;  Laterality: Bilateral;  . IRRIGATION AND DEBRIDEMENT ABSCESS     Hx: of left arm abscess related to drug use   . TEE WITHOUT CARDIOVERSION N/A  11/28/2017   Procedure: TRANSESOPHAGEAL ECHOCARDIOGRAM (TEE);  Surgeon: Sanda Klein, MD;  Location: [facility] ENDOSCOPY;  Service: Cardiovascular;  Laterality: N/A;    Family History  Problem Relation Age of Onset  . Diabetes Father   . Heart disease Father   . Mental illness Sister   . Cancer Neg Hx     Social History Reviewed with no changes to be made today.   Outpatient Medications Prior to Visit  Medication Sig Dispense Refill  . acetaminophen (TYLENOL) 325 MG tablet Take 2 tablets (650 mg total) by mouth every 6 (six) hours as needed for mild pain (or Fever >/= 101).    . Blood Glucose Monitoring Suppl (TRUE METRIX METER) DEVI 1 kit by Does not apply route as directed. Use as directed 1 Device 0  . gabapentin (NEURONTIN) 400 MG capsule TAKE 1 CAPSULE (400 MG TOTAL) BY MOUTH 3 (THREE) TIMES DAILY. 90 capsule 2  . glucose blood test strip Use as instructed 100 each 12  . Insulin Glargine (LANTUS SOLOSTAR) 100 UNIT/ML Solostar Pen Inject 40 Units into the  skin 2 (two) times daily. 30 mL 3  . Insulin Syringe-Needle U-100 (TRUEPLUS INSULIN SYRINGE) 31G X 5/16" 0.5 ML MISC USE AS DIRECTED. 100 each 6  . levothyroxine (SYNTHROID, LEVOTHROID) 50 MCG tablet Take 1 tablet (50 mcg total) by mouth daily. 90 tablet 0  . nitroGLYCERIN (NITRODUR - DOSED IN MG/24 HR) 0.2 mg/hr patch Place 1 patch (0.2 mg total) onto the skin daily. 30 patch 12  . pentoxifylline (TRENTAL) 400 MG CR tablet Take 1 tablet (400 mg total) by mouth 3 (three) times daily with meals. 90 tablet 3  . sertraline (ZOLOFT) 50 MG tablet TAKE 1 TABLET (50 MG TOTAL) BY MOUTH DAILY. 30 tablet 2  . sulfamethoxazole-trimethoprim (BACTRIM DS) 800-160 MG tablet Take 1 tablet by mouth 2 (two) times daily. 28 tablet 1  . TRUEPLUS LANCETS 28G MISC Use as directed 100 each 12  . insulin aspart (NOVOLOG) 100 UNIT/ML injection Inject 15 Units into the skin 3 (three) times daily before meals for 30 days. 3 vial PRN  . omeprazole (PRILOSEC) 20 MG capsule Take 1 capsule (20 mg total) by mouth daily. 90 capsule 3  . oxyCODONE-acetaminophen (PERCOCET) 5-325 MG tablet Take 1 tablet by mouth every 4 (four) hours as needed for moderate pain or severe pain. (Patient not taking: Reported on 10/01/2018) 30 tablet 0  . oxyCODONE-acetaminophen (PERCOCET/ROXICET) 5-325 MG tablet Take 1 tablet by mouth every 4 (four) hours as needed for severe pain. (Patient not taking: Reported on 10/01/2018) 30 tablet 0  . buPROPion (WELLBUTRIN XL) 150 MG 24 hr tablet Take (one) tablet by mouth once daily for 3 days; increase to 150 mg twice a day. If unable to tolerate 2 tabs; decrease back to 1 tab a day (Patient not taking: Reported on 10/01/2018) 90 tablet 1  . lisinopril (PRINIVIL,ZESTRIL) 20 MG tablet Take 1 tablet (20 mg total) by mouth daily. 90 tablet 1  . traMADol (ULTRAM) 50 MG tablet Take 1 tablet (50 mg total) by mouth 2 (two) times daily. (Patient not taking: Reported on 10/01/2018) 14 tablet 0   No facility-administered  medications prior to visit.     No Known Allergies     Objective:    BP 123/75 (BP Location: Left Arm, Patient Position: Sitting, Cuff Size: Normal)   Pulse (!) 107   Temp 99 F (37.2 C) (Oral)   Ht 5' 9" (1.753 m)   Wt 181 lb (  82.1 kg)   SpO2 98%   BMI 26.73 kg/m  Wt Readings from Last 3 Encounters:  10/01/18 181 lb (82.1 kg)  09/25/18 195 lb (88.5 kg)  09/18/18 195 lb (88.5 kg)    Physical Exam Cardiovascular:     Rate and Rhythm: Tachycardia present.          Patient has been counseled extensively about nutrition and exercise as well as the importance of adherence with medications and regular follow-up. The patient was given clear instructions to go to ER or return to medical center if symptoms don't improve, worsen or new problems develop. The patient verbalized understanding.   Follow-up: Return in about 3 months (around 01/01/2019).   Gildardo Pounds, FNP-BC [facility] and [place], [place]   10/01/2018, 10:23 PM

## 2018-10-02 MED ORDER — ATORVASTATIN CALCIUM 20 MG PO TABS: 20.0000 mg | ORAL_TABLET | Freq: Every day | ORAL | 3 refills | Status: DC

## 2018-10-02 MED FILL — ATORVASTATIN 20 MG TABLET: 20 | 30 days supply | Qty: 30 | Fill #0

## 2018-10-09 ENCOUNTER — Ambulatory Visit: Payer: Self-pay | Admitting: Orthopedic Surgery

## 2018-10-10 MED FILL — LISINOPRIL 10 MG TABS: 10 | 30 days supply | Qty: 30 | Fill #0

## 2018-10-10 MED FILL — NICOTINE 21 MG/24HR PATCH: 21 | 28 days supply | Qty: 28 | Fill #0

## 2018-10-10 MED FILL — ?AMLODIPINE BESYLATE 5MG TA: 5 | 30 days supply | Qty: 30 | Fill #0

## 2018-10-15 MED FILL — SERTRALINE HCL 50 MG TABS: 50 | 30 days supply | Qty: 30 | Fill #2

## 2018-10-15 MED FILL — GABAPENTIN 400 MG CAPSULE: 400 | 30 days supply | Qty: 90 | Fill #1

## 2018-10-15 MED FILL — $novoLOG 100 UNITS/ML VIAL: 100 | 44 days supply | Qty: 20 | Fill #1

## 2018-10-16 ENCOUNTER — Ambulatory Visit: Payer: Self-pay | Admitting: Orthopedic Surgery

## 2018-10-21 MED FILL — LEVOTHYROXINE 50 MCG TABLET: 50 | 30 days supply | Qty: 30 | Fill #2

## 2018-10-23 ENCOUNTER — Encounter: Payer: Self-pay | Admitting: Orthopedic Surgery

## 2018-10-23 ENCOUNTER — Ambulatory Visit (INDEPENDENT_AMBULATORY_CARE_PROVIDER_SITE_OTHER): Payer: Self-pay | Admitting: Orthopedic Surgery

## 2018-10-23 VITALS — Ht 69.0 in | Wt 181.0 lb

## 2018-10-23 DIAGNOSIS — Z89412 Acquired absence of left great toe: Secondary | ICD-10-CM

## 2018-11-07 MED FILL — TRUE METRIX TEST STRIP: 30 days supply | Qty: 100 | Fill #4

## 2018-11-07 MED FILL — LEVOTHYROXINE 50 MCG TABLET: 50 | 30 days supply | Qty: 30 | Fill #2

## 2018-11-07 MED FILL — GABAPENTIN 400 MG CAPSULE: 400 | 30 days supply | Qty: 90 | Fill #2

## 2018-11-07 MED FILL — SERTRALINE HCL 50 MG TABS: 50 | 30 days supply | Qty: 30 | Fill #2

## 2018-11-07 MED FILL — DULoxetine HCL 30 MG CPEP: 30 | 30 days supply | Qty: 30 | Fill #1

## 2018-11-07 MED FILL — LISINOPRIL 10 MG TABS: 10 | 30 days supply | Qty: 30 | Fill #1

## 2018-11-07 MED FILL — $LANTUS SOLOSTAR 100 UNITS/: 100 | 33 days supply | Qty: 27 | Fill #2

## 2018-11-10 ENCOUNTER — Encounter: Payer: Self-pay | Admitting: Orthopedic Surgery

## 2018-11-10 NOTE — Progress Notes (Signed)
Office Visit Note   [name]           Date of Birth: 04-04-1980           MRN: [id number] Visit Date: 10/23/2018              Requested by: Luke Pounds, NP [address] [place],  [place] [address] PCP: Luke Pounds, NP  Chief Complaint  Patient presents with  . Left Foot - Routine Post Op    09/05/18 left GT amputation       HPI: Patient is a 38 year old gentleman who presents status post left great toe amputation he is 6 weeks out he has been using nitroglycerin patch and Trental.  Assessment & Plan: Visit Diagnoses:  1. 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  Follow-Up Instructions: Return in about 4 weeks (around 11/20/2018).   Ortho Exam  Patient is alert, oriented, no adenopathy, well-dressed, normal affect, normal respiratory effort. Examination the incision is well-healed there is a small scab sutures are harvested Achilles is contracted with dorsiflexion only to neutral Achilles stretching was demonstrated.  Imaging: No results found. No images are attached to the encounter.  Labs: Lab Results  Component Value Date   HGBA1C 12.7 (A) 10/01/2018   HGBA1C 12.6 (A) 06/24/2018   HGBA1C >15 03/24/2018   ESRSEDRATE 40 (H) 12/03/2014   CRP 9.0 (H) 11/23/2017   CRP 12.2 (H) 11/22/2017   CRP 8.7 (H) 12/03/2014   REPTSTATUS 12/04/2017 FINAL 11/29/2017   GRAMSTAIN  11/29/2017    FEW WBC PRESENT, PREDOMINANTLY PMN NO ORGANISMS SEEN    CULT  11/29/2017    NO ANAEROBES ISOLATED Performed at [facility], [place] [address], [place], [place] [address]    LABORGA METHICILLIN RESISTANT STAPHYLOCOCCUS AUREUS (A) 12/16/2016     Lab Results  Component Value Date   ALBUMIN 2.5 (L) 09/05/2018   ALBUMIN 2.6 (L) 12/10/2017   ALBUMIN 2.6 (L) 12/04/2017    Lab Results  Component Value Date   MG 1.8 12/10/2017   MG 1.7 11/24/2013   No  results found for: VD25OH  No results found for: PREALBUMIN CBC EXTENDED Latest Ref Rng & Units 09/05/2018 09/05/2018 12/10/2017  WBC 4.0 - 10.5 K/uL - 18.9(H) 10.0  RBC 4.22 - 5.81 MIL/uL - 3.81(L) 3.46(L)  HGB 13.0 - 17.0 g/dL 9.5(L) 9.4(L) 9.6(L)  HCT 39.0 - 52.0 % 28.0(L) 29.8(L) 30.2(L)  PLT 150 - 400 K/uL - 604(H) 428(H)  NEUTROABS 1.7 - 7.7 K/uL - - -  LYMPHSABS 0.7 - 4.0 K/uL - - -     Body mass index is 26.73 kg/m.  Orders:  No orders of the defined types were placed in this encounter.  No orders of the defined types were placed in this encounter.    Procedures: No procedures performed  Clinical Data: No additional findings.  ROS:  All other systems negative, except as noted in the HPI. Review of Systems  Objective: Vital Signs: Ht 5\' 9"  (1.753 m)   Wt 181 lb (82.1 kg)   BMI 26.73 kg/m   Specialty Comments:  No specialty comments available.  PMFS History: Patient Active Problem List   Diagnosis Date Noted  . Subacute osteomyelitis, left ankle and foot ([place])   . Cutaneous abscess of left foot 09/04/2018  . Diabetic ulcer of left foot ([place]) 08/28/2018  . Acute  hematogenous osteomyelitis, left ankle and foot ([place])   . Streptococcal bacteremia 11/25/2017  . Hepatitis C 11/25/2017  . Actinomyces infection   . Acute renal failure superimposed on stage 2 chronic kidney disease ([place]) 07/21/2017  . GERD (gastroesophageal reflux disease) 07/21/2017  . Diabetic polyneuropathy associated with type 1 diabetes mellitus ([place])   . Prostatitis, acute   . MRSA infection   . Noncompliant bladder   . Urinary retention   . Prostate abscess   . ARF (acute renal failure) ([place]) 12/16/2016  . Anemia 12/16/2016  . Cocaine abuse ([place]) 08/16/2015  . Uncontrolled insulin dependent type 1 diabetes mellitus ([place]) 08/16/2015  . AKI (acute kidney injury) ([place]) 12/03/2014  . Elevated liver enzymes 07/26/2014  . Dental caries 07/23/2014  . Tinea pedis 07/23/2014  . DKA  (diabetic ketoacidoses) ([place]) 11/23/2013  . Abscess 10/14/2012  . Drug abuse, IV ([place]) 10/11/2012  . Tobacco abuse 10/11/2012  . Essential hypertension 10/11/2012   Past Medical History:  Diagnosis Date  . Anemia   . Anxiety  Dx 2008  . Chronic kidney disease   . Diabetes mellitus without complication ([place]) Dx [id number]  . DKA (diabetic ketoacidoses) ([place]) 07/21/2017  . GERD (gastroesophageal reflux disease) Dx 2008  . Headache(784.0)   . Hypertension   . Pneumonia   . Seizures ([place]) 2011    x 2 in lifetime. on Dilantin for a while.   . 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 Diabetes Father   . Heart disease Father   . Mental illness Sister   . Cancer Neg Hx     Past Surgical History:  Procedure Laterality Date  . AMPUTATION Left 09/05/2018   Procedure: LEFT GREAT TOE AMPUTATION;  Surgeon: [name], MD;  Location: [place];  Service: Orthopedics;  Laterality: Left;  . I&D EXTREMITY Left 10/11/2012   Procedure: IRRIGATION AND DEBRIDEMENT ABSCESS FOREARM;  Surgeon: Linna Hoff, MD;  Location: [place];  Service: Orthopedics;  Laterality: Left;  . I&D EXTREMITY Left 10/12/2012   Procedure: IRRIGATION AND DEBRIDEMENT FOREARM;  Surgeon: Linna Hoff, MD;  Location: [place];  Service: Orthopedics;  Laterality: Left;  . I&D EXTREMITY Left 10/14/2012   Procedure: incision and drainage left forearm;  Surgeon: Linna Hoff, MD;  Location: [place];  Service: Orthopedics;  Laterality: Left;  . I&D EXTREMITY Left 10/16/2012   Procedure: IRRIGATION AND DEBRIDEMENT LEFT FOREARM;  Surgeon: Linna Hoff, MD;  Location: [facility];  Service: Orthopedics;  Laterality: Left;  . I&D EXTREMITY Left 10/20/2012   Procedure: INCISION AND DRAINAGE AND DEBRIDEMENT LEFT  FOREARM;  Surgeon: Linna Hoff, MD;  Location: [facility];  Service: Orthopedics;  Laterality: Left;  . INCISION AND DRAINAGE OF WOUND Bilateral 11/29/2017   Procedure:  DEBRIDEMENT BILATERAL FEET, DEBRIDEMENT LEFT ANKLE, AND APPLY WOUND VAC;  Surgeon: [name], MD;  Location: [place];  Service: Orthopedics;  Laterality: Bilateral;  . IRRIGATION AND DEBRIDEMENT ABSCESS     Hx: of left arm abscess related to drug use   . TEE WITHOUT CARDIOVERSION N/A 11/28/2017   Procedure: TRANSESOPHAGEAL ECHOCARDIOGRAM (TEE);  Surgeon: Sanda Klein, MD;  Location: [facility] ENDOSCOPY;  Service: Cardiovascular;  Laterality: N/A;   Social History   Occupational History  . Occupation: Event set up    Employer: [employer]   . Occupation: Stencil for cars   Tobacco  Use  . Smoking status: Current Every Day Smoker    Packs/day: 1.00    Types: Cigarettes  . Smokeless tobacco: Never Used  Substance and Sexual Activity  . Alcohol use: No    Alcohol/week: 0.0 standard drinks    Comment: occasional  . Drug use: Not Currently    Types: Cocaine, IV, Heroin    Comment: IVDU since Dad died in 2006-06-25 with 15 month period of sobriety 2018-2019, relapse Aug 2019.  Marland Kitchen Sexual activity: Yes    Partners: Female    Birth control/protection: None

## 2018-11-11 ENCOUNTER — Other Ambulatory Visit: Payer: Self-pay | Admitting: Orthopedic Surgery

## 2018-11-20 ENCOUNTER — Ambulatory Visit: Payer: Medicaid Other | Admitting: Orthopedic Surgery

## 2018-11-25 ENCOUNTER — Telehealth: Payer: Self-pay | Admitting: Orthopedic Surgery

## 2018-11-25 ENCOUNTER — Other Ambulatory Visit: Payer: Self-pay | Admitting: Orthopedic Surgery

## 2018-11-25 DIAGNOSIS — L97522 Non-pressure chronic ulcer of other part of left foot with fat layer exposed: Secondary | ICD-10-CM

## 2018-11-25 MED ORDER — SULFAMETHOXAZOLE-TRIMETHOPRIM 800-160 MG PO TABS: 1.0000 | ORAL_TABLET | Freq: Two times a day (BID) | ORAL | 1 refills | Status: DC

## 2018-11-25 MED FILL — ?SULFAMETHOXAZOLE-TMP DS TA: 800-160 | 14 days supply | Qty: 28 | Fill #0

## 2018-11-25 NOTE — Telephone Encounter (Signed)
[facility]

## 2018-11-25 NOTE — Telephone Encounter (Signed)
09/05/18 right GT amputation. Pt missed appointment last week and states that his foot is swollen and feels painful. He says that his wife is a Marine scientist and she thinks that there is some infection. No other s/s. Pt is asking for an abx to get him until his appt on tuesday next week. Please advise.

## 2018-11-25 NOTE — Telephone Encounter (Signed)
Called pt and advised that rx has been faxed to pharm.

## 2018-11-25 NOTE — Telephone Encounter (Signed)
Patient called needing Rx refilled for antibiotic     The number to contact patient is [phone]

## 2018-12-02 ENCOUNTER — Ambulatory Visit: Payer: Medicaid Other | Admitting: Family

## 2018-12-08 ENCOUNTER — Other Ambulatory Visit: Payer: Self-pay | Admitting: Nurse Practitioner

## 2018-12-08 DIAGNOSIS — E1065 Type 1 diabetes mellitus with hyperglycemia: Secondary | ICD-10-CM

## 2018-12-08 MED FILL — ?AMLODIPINE BESYLATE 5MG TA: 5 | 30 days supply | Qty: 30 | Fill #1

## 2018-12-08 MED FILL — ?ATORVASTATIN 20 MG TABLET: 20 | 30 days supply | Qty: 30 | Fill #1

## 2018-12-08 MED FILL — DULoxetine HCL 30 MG CPEP: 30 | 30 days supply | Qty: 30 | Fill #2

## 2018-12-15 MED FILL — GABAPENTIN 400 MG CAPSULE: 400 | 30 days supply | Qty: 90 | Fill #0

## 2018-12-19 ENCOUNTER — Other Ambulatory Visit: Payer: Self-pay | Admitting: Family Medicine

## 2018-12-19 DIAGNOSIS — F331 Major depressive disorder, recurrent, moderate: Secondary | ICD-10-CM

## 2018-12-19 MED FILL — !LANTUS SOLOSTAR 100UNITS/M: 100 | 33 days supply | Qty: 27 | Fill #3

## 2018-12-19 MED FILL — AMLODIPINE BESYLATE 5 MG TA: 5 | 30 days supply | Qty: 30 | Fill #1

## 2018-12-19 MED FILL — GABAPENTIN 400 MG CAPSULE: 400 | 30 days supply | Qty: 90 | Fill #0

## 2018-12-19 MED FILL — ATORVASTATIN CALCIUM 20 MG: 20 | 30 days supply | Qty: 30 | Fill #1

## 2018-12-19 MED FILL — SERTRALINE HCL 50 MG TABLET: 50 | 30 days supply | Qty: 30 | Fill #0

## 2018-12-19 MED FILL — DULoxetine HCL 30 MG CPEP: 30 | 30 days supply | Qty: 30 | Fill #2

## 2018-12-30 ENCOUNTER — Other Ambulatory Visit: Payer: Self-pay | Admitting: Nurse Practitioner

## 2018-12-30 DIAGNOSIS — E039 Hypothyroidism, unspecified: Secondary | ICD-10-CM

## 2018-12-30 MED FILL — SULFAMETHOXAZOLE-TMP DS TAB: 800-160 | 14 days supply | Qty: 28 | Fill #1

## 2018-12-31 MED FILL — LEVOTHYROXINE 50 MCG TABLET: 50 | 30 days supply | Qty: 30 | Fill #0

## 2019-01-02 ENCOUNTER — Ambulatory Visit: Payer: Medicaid Other | Admitting: Nurse Practitioner

## 2019-01-19 MED FILL — !LANTUS SOLOSTAR 100UNITS/M: 100 | 15 days supply | Qty: 12 | Fill #4

## 2019-01-19 MED FILL — SULFAMETHOXAZOLE-TMP DS TAB: 800-160 | 14 days supply | Qty: 28 | Fill #1

## 2019-01-19 MED FILL — DULoxetine HCL 30 MG CPEP: 30 | 30 days supply | Qty: 30 | Fill #3

## 2019-01-19 MED FILL — LEVOTHYROXINE 50 MCG TABLET: 50 | 30 days supply | Qty: 30 | Fill #0

## 2019-01-19 MED FILL — ATORVASTATIN CALCIUM 20 MG: 20 | 30 days supply | Qty: 30 | Fill #2

## 2019-01-19 MED FILL — AMLODIPINE BESYLATE 5 MG TA: 5 | 30 days supply | Qty: 30 | Fill #2

## 2019-01-19 MED FILL — GABAPENTIN 400 MG CAPSULE: 400 | 30 days supply | Qty: 90 | Fill #1

## 2019-01-19 MED FILL — $novoLOG 100 UNITS/ML VIAL: 100 | 44 days supply | Qty: 20 | Fill #2

## 2019-01-19 MED FILL — LISINOPRIL 10 MG TABS: 10 | 30 days supply | Qty: 30 | Fill #2

## 2019-01-23 ENCOUNTER — Other Ambulatory Visit: Payer: Self-pay | Admitting: Pharmacist

## 2019-01-23 MED ORDER — INSULIN LISPRO (1 UNIT DIAL) 100 UNIT/ML (KWIKPEN): 15.0000 [IU] | PEN_INJECTOR | Freq: Three times a day (TID) | SUBCUTANEOUS | 2 refills | Status: DC

## 2019-01-23 MED FILL — HUMALOG 100 UNITS/ML KWIKPE: 100 | 33 days supply | Qty: 15 | Fill #0

## 2019-01-23 NOTE — Progress Notes (Signed)
Insurance prefers Humalog. Switch made d/t auto-sub policy.

## 2019-02-26 ENCOUNTER — Encounter: Payer: Self-pay | Admitting: Nurse Practitioner

## 2019-03-17 ENCOUNTER — Other Ambulatory Visit: Payer: Self-pay

## 2019-03-17 DIAGNOSIS — I10 Essential (primary) hypertension: Secondary | ICD-10-CM

## 2019-03-17 DIAGNOSIS — D649 Anemia, unspecified: Secondary | ICD-10-CM

## 2019-03-17 DIAGNOSIS — E1065 Type 1 diabetes mellitus with hyperglycemia: Secondary | ICD-10-CM

## 2019-03-17 DIAGNOSIS — E039 Hypothyroidism, unspecified: Secondary | ICD-10-CM

## 2019-03-17 DIAGNOSIS — IMO0002 Reserved for concepts with insufficient information to code with codable children: Secondary | ICD-10-CM

## 2019-03-17 NOTE — Progress Notes (Signed)
Pt. Is here to get lab requisition to get his lab drawn by the [name] facility.

## 2019-03-17 NOTE — Progress Notes (Signed)
Pt. Was informed to take the lab requisition to the the [facility] facility to get his lab drawn.

## 2019-03-18 ENCOUNTER — Encounter (HOSPITAL_COMMUNITY): Payer: Self-pay | Admitting: Emergency Medicine

## 2019-03-18 ENCOUNTER — Other Ambulatory Visit: Payer: Self-pay

## 2019-03-18 ENCOUNTER — Observation Stay (HOSPITAL_COMMUNITY)
Admission: EM | Admit: 2019-03-18 | Discharge: 2019-03-19 | Discharge status: Against Medical Advice | Payer: Commercial Managed Care - PPO | Attending: Internal Medicine | Admitting: Internal Medicine

## 2019-03-18 DIAGNOSIS — E1110 Type 2 diabetes mellitus with ketoacidosis without coma: Secondary | ICD-10-CM | POA: Diagnosis present

## 2019-03-18 DIAGNOSIS — N189 Chronic kidney disease, unspecified: Secondary | ICD-10-CM | POA: Insufficient documentation

## 2019-03-18 DIAGNOSIS — Z89412 Acquired absence of left great toe: Secondary | ICD-10-CM | POA: Diagnosis not present

## 2019-03-18 DIAGNOSIS — R569 Unspecified convulsions: Secondary | ICD-10-CM | POA: Diagnosis not present

## 2019-03-18 DIAGNOSIS — E875 Hyperkalemia: Secondary | ICD-10-CM | POA: Insufficient documentation

## 2019-03-18 DIAGNOSIS — Z79899 Other long term (current) drug therapy: Secondary | ICD-10-CM | POA: Diagnosis not present

## 2019-03-18 DIAGNOSIS — F419 Anxiety disorder, unspecified: Secondary | ICD-10-CM | POA: Insufficient documentation

## 2019-03-18 DIAGNOSIS — N183 Chronic kidney disease, stage 3 unspecified: Secondary | ICD-10-CM | POA: Diagnosis present

## 2019-03-18 DIAGNOSIS — I129 Hypertensive chronic kidney disease with stage 1 through stage 4 chronic kidney disease, or unspecified chronic kidney disease: Secondary | ICD-10-CM | POA: Diagnosis not present

## 2019-03-18 DIAGNOSIS — F1910 Other psychoactive substance abuse, uncomplicated: Secondary | ICD-10-CM | POA: Diagnosis present

## 2019-03-18 DIAGNOSIS — F17210 Nicotine dependence, cigarettes, uncomplicated: Secondary | ICD-10-CM | POA: Insufficient documentation

## 2019-03-18 DIAGNOSIS — F1110 Opioid abuse, uncomplicated: Secondary | ICD-10-CM | POA: Insufficient documentation

## 2019-03-18 DIAGNOSIS — L03119 Cellulitis of unspecified part of limb: Secondary | ICD-10-CM | POA: Diagnosis present

## 2019-03-18 DIAGNOSIS — L03113 Cellulitis of right upper limb: Secondary | ICD-10-CM | POA: Insufficient documentation

## 2019-03-18 DIAGNOSIS — K219 Gastro-esophageal reflux disease without esophagitis: Secondary | ICD-10-CM | POA: Diagnosis not present

## 2019-03-18 DIAGNOSIS — E1022 Type 1 diabetes mellitus with diabetic chronic kidney disease: Secondary | ICD-10-CM | POA: Diagnosis not present

## 2019-03-18 DIAGNOSIS — Z79890 Hormone replacement therapy (postmenopausal): Secondary | ICD-10-CM | POA: Insufficient documentation

## 2019-03-18 DIAGNOSIS — Z20828 Contact with and (suspected) exposure to other viral communicable diseases: Secondary | ICD-10-CM | POA: Diagnosis not present

## 2019-03-18 DIAGNOSIS — Z794 Long term (current) use of insulin: Secondary | ICD-10-CM | POA: Diagnosis not present

## 2019-03-18 DIAGNOSIS — E1010 Type 1 diabetes mellitus with ketoacidosis without coma: Secondary | ICD-10-CM | POA: Diagnosis present

## 2019-03-18 DIAGNOSIS — T148XXA Other injury of unspecified body region, initial encounter: Secondary | ICD-10-CM

## 2019-03-18 DIAGNOSIS — L089 Local infection of the skin and subcutaneous tissue, unspecified: Secondary | ICD-10-CM

## 2019-03-18 DIAGNOSIS — F1990 Other psychoactive substance use, unspecified, uncomplicated: Secondary | ICD-10-CM

## 2019-03-18 DIAGNOSIS — E039 Hypothyroidism, unspecified: Secondary | ICD-10-CM | POA: Diagnosis present

## 2019-03-18 LAB — CBC
HCT: 43.9 % (ref 39.0–52.0)
Hemoglobin: 13 g/dL (ref 13.0–17.0)
MCH: 24.3 pg — ABNORMAL LOW (ref 26.0–34.0)
MCHC: 29.6 g/dL — ABNORMAL LOW (ref 30.0–36.0)
MCV: 82.2 fL (ref 80.0–100.0)
Platelets: 718 10*3/uL — ABNORMAL HIGH (ref 150–400)
RBC: 5.34 MIL/uL (ref 4.22–5.81)
RDW: 14.9 % (ref 11.5–15.5)
WBC: 22 10*3/uL — ABNORMAL HIGH (ref 4.0–10.5)
nRBC: 0 % (ref 0.0–0.2)

## 2019-03-18 LAB — CBG MONITORING, ED
Glucose-Capillary: >600 mg/dL (ref 70–99)
Glucose-Capillary: 560 mg/dL (ref 70–99)

## 2019-03-18 LAB — URINALYSIS, ROUTINE W REFLEX MICROSCOPIC
Bacteria, UA: NONE SEEN
Bilirubin Urine: NEGATIVE
Glucose, UA: >=500 mg/dL — AB
Ketones, ur: 80 mg/dL — AB
Leukocytes,Ua: NEGATIVE
Nitrite: NEGATIVE
Protein, ur: 100 mg/dL — AB
Specific Gravity, Urine: 1.018 (ref 1.005–1.030)
pH: 5 (ref 5.0–8.0)

## 2019-03-18 LAB — BASIC METABOLIC PANEL
Anion gap: 26 — ABNORMAL HIGH (ref 5–15)
BUN: 37 mg/dL — ABNORMAL HIGH (ref 6–20)
CO2: 10 mmol/L — ABNORMAL LOW (ref 22–32)
Calcium: 9.2 mg/dL (ref 8.9–10.3)
Chloride: 92 mmol/L — ABNORMAL LOW (ref 98–111)
Creatinine, Ser: 2.71 mg/dL — ABNORMAL HIGH (ref 0.61–1.24)
GFR calc Af Amer: 33 mL/min — ABNORMAL LOW (ref >60)
GFR calc non Af Amer: 28 mL/min — ABNORMAL LOW (ref >60)
Glucose, Bld: 606 mg/dL (ref 70–99)
Potassium: 5.6 mmol/L — ABNORMAL HIGH (ref 3.5–5.1)
Sodium: 128 mmol/L — ABNORMAL LOW (ref 135–145)

## 2019-03-18 LAB — LACTIC ACID, PLASMA: Lactic Acid, Venous: 2 mmol/L (ref 0.5–1.9)

## 2019-03-18 LAB — BETA-HYDROXYBUTYRIC ACID: Beta-Hydroxybutyric Acid: >8 mmol/L — ABNORMAL HIGH (ref 0.05–0.27)

## 2019-03-18 MED ORDER — NICOTINE 21 MG/24HR TD PT24
21.0000 mg | MEDICATED_PATCH | Freq: Once | TRANSDERMAL | Status: DC
  Filled 2019-03-18: qty 1

## 2019-03-18 MED ORDER — INSULIN REGULAR(HUMAN) IN NACL 100-0.9 UT/100ML-% IV SOLN
INTRAVENOUS | Status: DC
  Administered 2019-03-19: 7 [IU]/h via INTRAVENOUS
  Filled 2019-03-18: qty 100

## 2019-03-18 MED ORDER — VANCOMYCIN HCL 1500 MG/300ML IV SOLN
1500.0000 mg | Freq: Once | INTRAVENOUS | Status: AC
  Administered 2019-03-19: 1500 mg via INTRAVENOUS
  Filled 2019-03-18: qty 300

## 2019-03-18 MED ORDER — CLINDAMYCIN PHOSPHATE 600 MG/50ML IV SOLN
600.0000 mg | Freq: Once | INTRAVENOUS | Status: AC
  Administered 2019-03-19: 600 mg via INTRAVENOUS
  Filled 2019-03-18: qty 50

## 2019-03-18 MED ORDER — SODIUM CHLORIDE 0.9 % IV BOLUS
1000.0000 mL | INTRAVENOUS | Status: AC
  Administered 2019-03-19: 1000 mL via INTRAVENOUS

## 2019-03-18 MED ORDER — SODIUM CHLORIDE 0.9% FLUSH: 10.0000 mL | Freq: Two times a day (BID) | INTRAVENOUS | Status: DC

## 2019-03-18 MED ORDER — SODIUM CHLORIDE 0.9 % IV SOLN: 2.0000 g | Freq: Once | INTRAVENOUS | Status: DC

## 2019-03-18 MED ORDER — SODIUM CHLORIDE 0.9 % IV BOLUS: 1000.0000 mL | Freq: Once | INTRAVENOUS | Status: DC

## 2019-03-18 MED ORDER — SODIUM CHLORIDE 0.9% FLUSH: 10.0000 mL | INTRAVENOUS | Status: DC | PRN

## 2019-03-18 MED ORDER — DEXTROSE 50 % IV SOLN: 0.0000 mL | INTRAVENOUS | Status: DC | PRN

## 2019-03-18 MED ORDER — OXYCODONE-ACETAMINOPHEN 5-325 MG PO TABS
2.0000 | ORAL_TABLET | Freq: Once | ORAL | Status: AC
  Administered 2019-03-19: 2 via ORAL
  Filled 2019-03-18: qty 2

## 2019-03-18 MED ORDER — DEXTROSE-NACL 5-0.45 % IV SOLN: INTRAVENOUS | Status: DC

## 2019-03-18 MED ORDER — SODIUM CHLORIDE 0.9 % IV SOLN: INTRAVENOUS | Status: DC

## 2019-03-18 NOTE — ED Provider Notes (Signed)
Luke [name]   CSN: [id number] Arrival date & time: 03/18/19  6644     History Chief Complaint  Patient presents with  . Hyperglycemia    Luke [name] is a 38 y.o. male with a history of diabetes mellitus type 1, IV heroin use, chronic kidney disease, HTN, GERD, subacute osteomyelitis of the left ankle and foot, and prostate abscess who presents to the emergency department with a chief complaint of generalized weakness.  The patient reports that he has been feeling generally weak and fatigued for most of the day.  He had multiple episodes of nonbloody, nonbilious vomiting this morning accompanied by generalized abdominal pain.  He also endorses polyuria, onset today.  He has been getting progressively more short of breath throughout the day.  He denies chest pain, fever, diarrhea, cough, constipation, rash, loss of sense of taste or smell, confusion, headache, dysuria, or hematuria.  He is concerned that he is in DKA.  He has been compliant with his home insulin with no missed doses.  Reports that he took his 40 units of Lantus this morning and his last dose of 15 units of Humalog was tonight.  He also endorses worsening right arm pain over the last week.  He has noticed a foul-smelling, yellow drainage from some of the wounds on his right arm where he injects heroin over the last week.  He reports associated chills.  No fevers, numbness, or weakness.  No joint pain.  Reports that he has not injected in his right arm for almost 4 days.  He last use heroin yesterday by injecting in his left arm.  He is concerned that he may go into withdrawal soon.   The history is provided by the patient. No language interpreter was used.       Past Medical History:  Diagnosis Date  . Anemia   . Anxiety  Dx 2008  . Chronic kidney disease   . Diabetes mellitus without complication ([place]) Dx 0347  . DKA (diabetic ketoacidoses) ([place]) 07/21/2017  . GERD  (gastroesophageal reflux disease) Dx 2008  . Headache(784.0)   . Hypertension   . Pneumonia   . Seizures ([place]) 2011    x 2 in lifetime. on Dilantin for a while.   . Substance abuse ([place]) 2013    heroin use, multiple relapses    Patient Active Problem List   Diagnosis Date Noted  . Right forearm cellulitis   . Hypothyroidism   . Subacute osteomyelitis, left ankle and foot ([place])   . Cutaneous abscess of left foot 09/04/2018  . Diabetic ulcer of left foot ([place]) 08/28/2018  . Acute hematogenous osteomyelitis, left ankle and foot ([place])   . Streptococcal bacteremia 11/25/2017  . Hepatitis C 11/25/2017  . Actinomyces infection   . Acute renal failure superimposed on stage 2 chronic kidney disease ([place]) 07/21/2017  . GERD (gastroesophageal reflux disease) 07/21/2017  . Diabetic polyneuropathy associated with type 1 diabetes mellitus ([place])   . Prostatitis, acute   . MRSA infection   . Noncompliant bladder   . Urinary retention   . Prostate abscess   . ARF (acute renal failure) ([place]) 12/16/2016  . Anemia 12/16/2016  . Cocaine abuse ([place]) 08/16/2015  . Uncontrolled insulin dependent type 1 diabetes mellitus ([place]) 08/16/2015  . AKI (acute kidney injury) ([place]) 12/03/2014  . Elevated liver enzymes 07/26/2014  . Dental caries 07/23/2014  . Tinea pedis 07/23/2014  . DKA (diabetic ketoacidoses) ([place]) 11/23/2013  .  Abscess 10/14/2012  . Chronic kidney disease 10/14/2012  . Drug abuse, IV ([place]) 10/11/2012  . Tobacco abuse 10/11/2012  . Essential hypertension 10/11/2012    Past Surgical History:  Procedure Laterality Date  . AMPUTATION Left 09/05/2018   Procedure: LEFT GREAT TOE AMPUTATION;  Surgeon: [name], MD;  Location: [facility];  Service: Orthopedics;  Laterality: Left;  . I & D EXTREMITY Left 10/11/2012   Procedure: IRRIGATION AND DEBRIDEMENT ABSCESS FOREARM;  Surgeon: Linna Hoff, MD;  Location: [facility];  Service: Orthopedics;  Laterality: Left;  . I & D EXTREMITY Left  10/12/2012   Procedure: IRRIGATION AND DEBRIDEMENT FOREARM;  Surgeon: Linna Hoff, MD;  Location: [facility];  Service: Orthopedics;  Laterality: Left;  . I & D EXTREMITY Left 10/14/2012   Procedure: incision and drainage left forearm;  Surgeon: Linna Hoff, MD;  Location: [facility];  Service: Orthopedics;  Laterality: Left;  . I & D EXTREMITY Left 10/16/2012   Procedure: IRRIGATION AND DEBRIDEMENT LEFT FOREARM;  Surgeon: Linna Hoff, MD;  Location: [facility];  Service: Orthopedics;  Laterality: Left;  . I & D EXTREMITY Left 10/20/2012   Procedure: INCISION AND DRAINAGE AND DEBRIDEMENT LEFT  FOREARM;  Surgeon: Linna Hoff, MD;  Location: [facility];  Service: Orthopedics;  Laterality: Left;  . INCISION AND DRAINAGE OF WOUND Bilateral 11/29/2017   Procedure: DEBRIDEMENT BILATERAL FEET, DEBRIDEMENT LEFT ANKLE, AND APPLY WOUND VAC;  Surgeon: [name], MD;  Location: [facility];  Service: Orthopedics;  Laterality: Bilateral;  . IRRIGATION AND DEBRIDEMENT ABSCESS     Hx: of left arm abscess related to drug use   . TEE WITHOUT CARDIOVERSION N/A 11/28/2017   Procedure: TRANSESOPHAGEAL ECHOCARDIOGRAM (TEE);  Surgeon: Sanda Klein, MD;  Location: [facility] ENDOSCOPY;  Service: Cardiovascular;  Laterality: N/A;       Family History  Problem Relation Age of Onset  . Diabetes Father   . Heart disease Father   . Mental illness Sister   . Cancer Neg Hx     Social History   Tobacco Use  . Smoking status: Current Every Day Smoker    Packs/day: 1.00    Types: Cigarettes  . Smokeless tobacco: Never Used  Substance Use Topics  . Alcohol use: No    Alcohol/week: 0.0 standard drinks    Comment: occasional  . Drug use: Not Currently    Types: Cocaine, IV, Heroin    Comment: IVDU since Dad died in 06/01/2006 with 15 month period of sobriety 2018-2019, relapse Aug 2019.    Home Medications Prior to Admission medications   Medication Sig Start Date End Date Taking? Authorizing Provider  acetaminophen (TYLENOL) 325 MG  tablet Take 2 tablets (650 mg total) by mouth every 6 (six) hours as needed for mild pain (or Fever >/= 101). 12/19/16  Yes Debbe Odea, MD  amLODipine (NORVASC) 5 MG tablet Take 1 tablet (5 mg total) by mouth daily. 10/01/18  Yes Gildardo Pounds, NP  atorvastatin (LIPITOR) 20 MG tablet Take 1 tablet (20 mg total) by mouth daily. 10/02/18  Yes Gildardo Pounds, NP  DULoxetine (CYMBALTA) 30 MG capsule Take 1 capsule (30 mg total) by mouth daily. 10/01/18 03/19/19 Yes Gildardo Pounds, NP  gabapentin (NEURONTIN) 400 MG capsule TAKE 1 CAPSULE (400 MG TOTAL) BY MOUTH 3 (THREE) TIMES DAILY. 12/12/18 03/19/19 Yes Gildardo Pounds, NP  Insulin Glargine (LANTUS SOLOSTAR) 100 UNIT/ML Solostar Pen Inject 40 Units into the skin 2 (two) times daily.  Patient taking differently: Inject 40 Units into the skin daily.  06/24/18  Yes Gildardo Pounds, NP  insulin lispro (HUMALOG KWIKPEN) 100 UNIT/ML KwikPen Inject 0.15 mLs (15 Units total) into the skin 3 (three) times daily. Patient taking differently: Inject 4-20 Units into the skin 3 (three) times daily. Per sliding scale 01/23/19  Yes Newlin, Charlane Ferretti, MD  levothyroxine (SYNTHROID) 50 MCG tablet TAKE 1 TABLET (50 MCG TOTAL) BY MOUTH DAILY. 12/30/18  Yes Charlott Rakes, MD  lisinopril (ZESTRIL) 10 MG tablet Take 1 tablet (10 mg total) by mouth daily. 10/01/18 03/19/19 Yes Gildardo Pounds, NP  nitroGLYCERIN (NITRODUR - DOSED IN MG/24 HR) 0.2 mg/hr patch Place 1 patch (0.2 mg total) onto the skin daily. 09/18/18  Yes [name], MD  omeprazole (PRILOSEC) 20 MG capsule Take 1 capsule (20 mg total) by mouth daily. 03/24/18 03/19/19 Yes Gildardo Pounds, NP  pentoxifylline (TRENTAL) 400 MG CR tablet TAKE 1 TABLET (400 MG TOTAL) BY MOUTH 3 (THREE) TIMES DAILY WITH MEALS. 11/11/18  Yes [name], MD  sertraline (ZOLOFT) 50 MG tablet TAKE 1 TABLET (50 MG TOTAL) BY MOUTH DAILY. 12/19/18  Yes Charlott Rakes, MD  Blood Glucose Monitoring Suppl (TRUE METRIX METER) DEVI 1 kit by  Does not apply route as directed. Use as directed 06/07/15   Boykin Nearing, MD  glucose blood test strip Use as instructed 03/24/18   Gildardo Pounds, NP  Insulin Syringe-Needle U-100 (TRUEPLUS INSULIN SYRINGE) 31G X 5/16" 0.5 ML MISC USE AS DIRECTED. 06/24/18   Gildardo Pounds, NP  oxyCODONE-acetaminophen (PERCOCET) 5-325 MG tablet Take 1 tablet by mouth every 4 (four) hours as needed for moderate pain or severe pain. Patient not taking: Reported on 03/19/2019 09/12/18 09/12/19  Rayburn, Neta Mends, PA-C  oxyCODONE-acetaminophen (PERCOCET/ROXICET) 5-325 MG tablet Take 1 tablet by mouth every 4 (four) hours as needed for severe pain. Patient not taking: Reported on 03/19/2019 09/18/18   [name], MD  sulfamethoxazole-trimethoprim (BACTRIM DS) 800-160 MG tablet Take 1 tablet by mouth 2 (two) times daily. Patient not taking: Reported on 03/19/2019 11/25/18   [name], MD  TRUEPLUS LANCETS 28G MISC Use as directed 01/28/17   Alfonse Spruce, FNP    Allergies    Patient has no known allergies.  Review of Systems   Review of Systems  Constitutional: Positive for chills. Negative for appetite change, diaphoresis and fever.  HENT: Negative for congestion, sore throat and tinnitus.   Respiratory: Positive for shortness of breath. Negative for cough and wheezing.   Cardiovascular: Negative for chest pain.  Gastrointestinal: Positive for abdominal pain, nausea and vomiting. Negative for blood in stool, constipation and diarrhea.  Genitourinary: Negative for dysuria.  Musculoskeletal: Positive for arthralgias and myalgias. Negative for back pain, gait problem, joint swelling, neck pain and neck stiffness.  Skin: Positive for color change and wound. Negative for rash.  Allergic/Immunologic: Negative for immunocompromised state.  Neurological: Negative for syncope, speech difficulty, weakness, numbness and headaches.  Psychiatric/Behavioral: Negative for confusion.    Physical Exam  Updated Vital Signs BP 135/75 (BP Location: Left Arm)   Pulse 71   Temp 98.9 F (37.2 C) (Oral)   Resp 18   Ht '5\' 9"'$  (1.753 m)   Wt 77.1 kg   SpO2 94%   BMI 25.10 kg/m   Physical Exam Vitals and nursing [name] reviewed.  Constitutional:      General: He is not in acute distress.    Appearance: He is  well-developed. He is ill-appearing. He is not toxic-appearing or diaphoretic.  HENT:     Head: Normocephalic.  Eyes:     Conjunctiva/sclera: Conjunctivae normal.  Cardiovascular:     Rate and Rhythm: Normal rate and regular rhythm.     Pulses: Normal pulses.     Heart sounds: Normal heart sounds. No murmur. No friction rub. No gallop.   Pulmonary:     Effort: Pulmonary effort is normal. No respiratory distress.     Breath sounds: No stridor. No wheezing, rhonchi or rales.     Comments: No Kussmaul respirations. Chest:     Chest wall: No tenderness.  Abdominal:     General: There is no distension.     Palpations: Abdomen is soft. There is no mass.     Tenderness: There is no abdominal tenderness. There is no right CVA tenderness, left CVA tenderness, guarding or rebound.     Hernia: No hernia is present.  Musculoskeletal:     Cervical back: Neck supple.     Right lower leg: No edema.     Left lower leg: No edema.     Comments: Multiple ulcerations and wounds noted to the right forearm.  Several of the ulcerations have surrounding fluctuance.  Purulent drainage is able to be expressed as of some of the wounds.  There is moderate swelling noted diffusely to the right forearm.  See picture below.  Minimal warmth is noted.  No red streaking or significant erythema.  There are multiple wounds noted to the left forearm with scaling.  No induration, redness, warmth, fluctuance.   Multiple areas of scarring noted to the bilateral upper arms.  Radial pulses are 2+ and symmetric.  Sensation is intact and equal throughout.  Full active and passive range of motion of the bilateral joints of  the upper extremities.  Multiple track marks are noted to the bilateral arms.  Skin:    General: Skin is warm and dry.  Neurological:     Mental Status: He is alert.     Comments: Alert and oriented x4.  Psychiatric:        Behavior: Behavior normal.    Right forearm   Left forearm     ED Results / Procedures / Treatments   Labs (all labs ordered are listed, but only abnormal results are displayed) Labs Reviewed  BASIC METABOLIC PANEL - Abnormal; Notable for the following components:      Result Value   Sodium 128 (*)    Potassium 5.6 (*)    Chloride 92 (*)    CO2 10 (*)    Glucose, Bld 606 (*)    BUN 37 (*)    Creatinine, Ser 2.71 (*)    GFR calc non Af Amer 28 (*)    GFR calc Af Amer 33 (*)    Anion gap 26 (*)    All other components within normal limits  CBC - Abnormal; Notable for the following components:   WBC 22.0 (*)    MCH 24.3 (*)    MCHC 29.6 (*)    Platelets 718 (*)    All other components within normal limits  URINALYSIS, ROUTINE W REFLEX MICROSCOPIC - Abnormal; Notable for the following components:   Color, Urine STRAW (*)    Glucose, UA >=500 (*)    Hgb urine dipstick SMALL (*)    Ketones, ur 80 (*)    Protein, ur 100 (*)    All other components within normal limits  BETA-HYDROXYBUTYRIC ACID -  Abnormal; Notable for the following components:   Beta-Hydroxybutyric Acid >8.00 (*)    All other components within normal limits  LACTIC ACID, PLASMA - Abnormal; Notable for the following components:   Lactic Acid, Venous 2.0 (*)    All other components within normal limits  APTT - Abnormal; Notable for the following components:   aPTT 39 (*)    All other components within normal limits  CBG MONITORING, ED - Abnormal; Notable for the following components:   Glucose-Capillary >600 (*)    All other components within normal limits  CBG MONITORING, ED - Abnormal; Notable for the following components:   Glucose-Capillary 560 (*)    All other components  within normal limits  CBG MONITORING, ED - Abnormal; Notable for the following components:   Glucose-Capillary 574 (*)    All other components within normal limits  POCT I-STAT EG7 - Abnormal; Notable for the following components:   pCO2, Ven 30.2 (*)    pO2, Ven 72.0 (*)    Bicarbonate 16.6 (*)    TCO2 17 (*)    Acid-base deficit 8.0 (*)    Sodium 125 (*)    Potassium 5.3 (*)    Calcium, Ion 1.10 (*)    HCT 31.0 (*)    Hemoglobin 10.5 (*)    All other components within normal limits  CBG MONITORING, ED - Abnormal; Notable for the following components:   Glucose-Capillary 574 (*)    All other components within normal limits  CULTURE, BLOOD (ROUTINE X 2)  CULTURE, BLOOD (ROUTINE X 2)  URINE CULTURE  LACTIC ACID, PLASMA  PROTIME-INR  BLOOD GAS, VENOUS  I-STAT VENOUS BLOOD GAS, ED  POC SARS CORONAVIRUS 2 AG -  ED    EKG EKG Interpretation  Date/Time:  Wednesday March 18 2019 19:32:12 EST Ventricular Rate:  133 PR Interval:  130 QRS Duration: 72 QT Interval:  316 QTC Calculation: 470 R Axis:   48 Text Interpretation: Sinus tachycardia Minimal voltage criteria for LVH, may be normal variant ( Cornell product ) Septal infarct , age undetermined Abnormal ECG Confirmed by Madalyn Rob [phone]) on 03/18/2019 8:07:20 PM   Radiology No results found.  Procedures .Critical Care Performed by: Joanne Gavel, PA-C Authorized by: Joanne Gavel, PA-C   Critical care provider statement:    Critical care time (minutes):  55   Critical care time was exclusive of:  Teaching time and separately billable procedures and treating other patients   Critical care was necessary to treat or prevent imminent or life-threatening deterioration of the following conditions:  Sepsis and metabolic crisis   Critical care was time spent personally by me on the following activities:  Obtaining history from patient or surrogate, ordering and review of laboratory studies, ordering and  performing treatments and interventions, pulse oximetry, re-evaluation of patient's condition, review of old charts, examination of patient, evaluation of patient's response to treatment and development of treatment plan with patient or surrogate   I assumed direction of critical care for this patient from another provider in my specialty: no     (including critical care time)  Medications Ordered in ED Medications  insulin regular, human (MYXREDLIN) 100 units/ 100 mL infusion (7 Units/hr Intravenous New Bag/Given 03/19/19 0032)  0.9 %  sodium chloride infusion ( Intravenous New Bag/Given 03/19/19 0032)  dextrose 5 %-0.45 % sodium chloride infusion (has no administration in time range)  dextrose 50 % solution 0-50 mL (has no administration in time range)  sodium chloride 0.9 %  bolus 1,000 mL (1,000 mLs Intravenous New Bag/Given 03/19/19 0032)  nicotine (NICODERM CQ - dosed in mg/24 hours) patch 21 mg (21 mg Transdermal Refused 03/19/19 0032)  vancomycin (VANCOREADY) IVPB 1500 mg/300 mL (1,500 mg Intravenous New Bag/Given 03/19/19 0026)  ceFEPIme (MAXIPIME) 2 g in sodium chloride 0.9 % 100 mL IVPB (has no administration in time range)  sodium chloride flush (NS) 0.9 % injection 10-40 mL (has no administration in time range)  sodium chloride flush (NS) 0.9 % injection 10-40 mL (has no administration in time range)  oxyCODONE-acetaminophen (PERCOCET/ROXICET) 5-325 MG per tablet 2 tablet (2 tablets Oral Given 03/19/19 0016)  clindamycin (CLEOCIN) IVPB 600 mg (0 mg Intravenous Stopped 03/19/19 0109)    ED Course  I have reviewed the triage vital signs and the nursing notes.  Pertinent labs & imaging results that were available during my care of the patient were reviewed by me and considered in my medical decision making (see chart for details).    MDM Rules/Calculators/A&P                      38 year old male with a history of diabetes mellitus type 1, IV heroin use, chronic kidney disease,  HTN, GERD, subacute osteomyelitis of the left ankle and foot, and prostate abscess presenting with hyperglycemia.  He has had generalized weakness, fatigue, worsening shortness of breath, vomiting, nausea, generalized abdominal pain, and polyuria, onset today.  He reports compliance with his home insulin.  His last dose of 40 units of Lantus was this morning and he has taken his Humalog today, last dose of 15 units was tonight.   On arrival to the ER, he was tachycardic in the 130s.  Afebrile.  Normotensive.  He is mentating normally.  At shift change, the patient was evaluated by Dr. Wyvonnia Dusky given concern for IV access with the wounds on the patient's bilateral arms and  He is in DKA with an anion gap of 26, glucose of 606, and bicarb of 10.  Beta hydroxybutyrate acid is greater than 8.  Endo tool initiated in the ER.   He also has a new AKI with creatinine of 2.71, up from 2.13 in June 2020.  We will add on IV fluid bolus x2.  I am concerned that his DKA is secondary to an infection on his right arm where he injects heroin.  He has noticed worsening right arm pain with worsening swelling and purulent, foul-smelling drainage over the last week.  He has had a subacute osteomyelitis of his left foot and ankle in the past.  He is a leukocytosis of 22,000 today.  Given the wounds on his arm, he will likely need dedicated imaging to assess for severity of infection, including osteomyelitis versus necrotizing fasciitis. Broad-spectrum antibiotics with cefepime and vancomycin and clindamycin have been initiated given these concerns.  He is critically ill and will require admission.  COVID-19 test has been ordered and is pending.  Consulted the hospitalist team and Dr. Myna Hidalgo will accept the patient for admission.  We discussed imaging of the patient's arm given that he has had subacute osteomyelitis in the past, but given his presentation now, I am more concerned for necrotizing fasciitis.  He will likely  require surgical debridement.  Will place order for CT of the upper extremity for hospitalist team.   The patient appears reasonably stabilized for admission considering the current resources, flow, and capabilities available in the ED at this time, and I doubt any other [facility]  requiring further screening and/or treatment in the ED prior to admission.  Final Clinical Impression(s) / ED Diagnoses Final diagnoses:  Diabetic ketoacidosis without coma associated with type 1 diabetes mellitus ([place])  IVDU (intravenous drug user)  Infected wound    Rx / DC Orders ED Discharge Orders    None       Joanne Gavel, PA-C 03/19/19 0147    Merryl Hacker, MD 03/19/19 [phone]

## 2019-03-18 NOTE — ED Triage Notes (Signed)
Patient reports elevated blood sugar today , cbg="HIGH" at triage , emesis with fatigue and generalized weakness , no fever or chills .

## 2019-03-18 NOTE — ED Notes (Signed)
This RN called IV team per PA Mia's request to inform them of necessity for line/potential PICC. IV team acknowledged order and informed this RN that pt is #4 to be seen. Will continue to monitor and inform PA Mia.

## 2019-03-18 NOTE — ED Notes (Signed)
ED providers and IV team at bedside

## 2019-03-19 ENCOUNTER — Observation Stay (HOSPITAL_COMMUNITY): Payer: Commercial Managed Care - PPO

## 2019-03-19 ENCOUNTER — Encounter (HOSPITAL_COMMUNITY): Payer: Self-pay | Admitting: Family Medicine

## 2019-03-19 ENCOUNTER — Telehealth (HOSPITAL_BASED_OUTPATIENT_CLINIC_OR_DEPARTMENT_OTHER): Payer: Self-pay | Admitting: Emergency Medicine

## 2019-03-19 DIAGNOSIS — E1110 Type 2 diabetes mellitus with ketoacidosis without coma: Secondary | ICD-10-CM | POA: Diagnosis not present

## 2019-03-19 DIAGNOSIS — L03119 Cellulitis of unspecified part of limb: Secondary | ICD-10-CM | POA: Diagnosis present

## 2019-03-19 DIAGNOSIS — L03113 Cellulitis of right upper limb: Secondary | ICD-10-CM

## 2019-03-19 DIAGNOSIS — N189 Chronic kidney disease, unspecified: Secondary | ICD-10-CM | POA: Diagnosis not present

## 2019-03-19 DIAGNOSIS — F1910 Other psychoactive substance abuse, uncomplicated: Secondary | ICD-10-CM

## 2019-03-19 DIAGNOSIS — E039 Hypothyroidism, unspecified: Secondary | ICD-10-CM | POA: Diagnosis present

## 2019-03-19 LAB — CBG MONITORING, ED
Glucose-Capillary: 148 mg/dL — ABNORMAL HIGH (ref 70–99)
Glucose-Capillary: 160 mg/dL — ABNORMAL HIGH (ref 70–99)
Glucose-Capillary: 196 mg/dL — ABNORMAL HIGH (ref 70–99)
Glucose-Capillary: 289 mg/dL — ABNORMAL HIGH (ref 70–99)
Glucose-Capillary: 386 mg/dL — ABNORMAL HIGH (ref 70–99)
Glucose-Capillary: 430 mg/dL — ABNORMAL HIGH (ref 70–99)
Glucose-Capillary: 485 mg/dL — ABNORMAL HIGH (ref 70–99)
Glucose-Capillary: 574 mg/dL (ref 70–99)
Glucose-Capillary: 574 mg/dL (ref 70–99)

## 2019-03-19 LAB — POCT I-STAT EG7
Acid-base deficit: 8 mmol/L — ABNORMAL HIGH (ref 0.0–2.0)
Bicarbonate: 16.6 mmol/L — ABNORMAL LOW (ref 20.0–28.0)
Calcium, Ion: 1.1 mmol/L — ABNORMAL LOW (ref 1.15–1.40)
HCT: 31 % — ABNORMAL LOW (ref 39.0–52.0)
Hemoglobin: 10.5 g/dL — ABNORMAL LOW (ref 13.0–17.0)
O2 Saturation: 94 %
Potassium: 5.3 mmol/L — ABNORMAL HIGH (ref 3.5–5.1)
Sodium: 125 mmol/L — ABNORMAL LOW (ref 135–145)
TCO2: 17 mmol/L — ABNORMAL LOW (ref 22–32)
pCO2, Ven: 30.2 mmHg — ABNORMAL LOW (ref 44.0–60.0)
pH, Ven: 7.347 (ref 7.250–7.430)
pO2, Ven: 72 mmHg — ABNORMAL HIGH (ref 32.0–45.0)

## 2019-03-19 LAB — HIV ANTIBODY (ROUTINE TESTING W REFLEX): HIV Screen 4th Generation wRfx: NONREACTIVE

## 2019-03-19 LAB — BASIC METABOLIC PANEL
Anion gap: 12 (ref 5–15)
Anion gap: 10 (ref 5–15)
BUN: 37 mg/dL — ABNORMAL HIGH (ref 6–20)
BUN: 35 mg/dL — ABNORMAL HIGH (ref 6–20)
CO2: 20 mmol/L — ABNORMAL LOW (ref 22–32)
CO2: 21 mmol/L — ABNORMAL LOW (ref 22–32)
Calcium: 8.4 mg/dL — ABNORMAL LOW (ref 8.9–10.3)
Calcium: 8.6 mg/dL — ABNORMAL LOW (ref 8.9–10.3)
Chloride: 99 mmol/L (ref 98–111)
Chloride: 104 mmol/L (ref 98–111)
Creatinine, Ser: 2.23 mg/dL — ABNORMAL HIGH (ref 0.61–1.24)
Creatinine, Ser: 2.15 mg/dL — ABNORMAL HIGH (ref 0.61–1.24)
GFR calc Af Amer: 42 mL/min — ABNORMAL LOW (ref >60)
GFR calc Af Amer: 44 mL/min — ABNORMAL LOW (ref >60)
GFR calc non Af Amer: 36 mL/min — ABNORMAL LOW (ref >60)
GFR calc non Af Amer: 38 mL/min — ABNORMAL LOW (ref >60)
Glucose, Bld: 278 mg/dL — ABNORMAL HIGH (ref 70–99)
Glucose, Bld: 206 mg/dL — ABNORMAL HIGH (ref 70–99)
Potassium: 3.6 mmol/L (ref 3.5–5.1)
Potassium: 3.4 mmol/L — ABNORMAL LOW (ref 3.5–5.1)
Sodium: 131 mmol/L — ABNORMAL LOW (ref 135–145)
Sodium: 135 mmol/L (ref 135–145)

## 2019-03-19 LAB — SEDIMENTATION RATE: Sed Rate: 55 mm/hr — ABNORMAL HIGH (ref 0–16)

## 2019-03-19 LAB — CREATININE, URINE, RANDOM: Creatinine, Urine: 92.31 mg/dL

## 2019-03-19 LAB — LACTIC ACID, PLASMA: Lactic Acid, Venous: 1.5 mmol/L (ref 0.5–1.9)

## 2019-03-19 LAB — C-REACTIVE PROTEIN: CRP: 3.8 mg/dL — ABNORMAL HIGH (ref <1.0)

## 2019-03-19 LAB — BETA-HYDROXYBUTYRIC ACID: Beta-Hydroxybutyric Acid: 0.56 mmol/L — ABNORMAL HIGH (ref 0.05–0.27)

## 2019-03-19 LAB — HEMOGLOBIN A1C
Hgb A1c MFr Bld: 10 % — ABNORMAL HIGH (ref 4.8–5.6)
Mean Plasma Glucose: 240.3 mg/dL

## 2019-03-19 LAB — SODIUM, URINE, RANDOM: Sodium, Ur: 13 mmol/L

## 2019-03-19 LAB — SARS CORONAVIRUS 2 (TAT 6-24 HRS): SARS Coronavirus 2: NEGATIVE

## 2019-03-19 LAB — PROTIME-INR
INR: 1.1 (ref 0.8–1.2)
Prothrombin Time: 14 seconds (ref 11.4–15.2)

## 2019-03-19 LAB — POC SARS CORONAVIRUS 2 AG -  ED: SARS Coronavirus 2 Ag: NEGATIVE

## 2019-03-19 LAB — APTT: aPTT: 39 seconds — ABNORMAL HIGH (ref 24–36)

## 2019-03-19 MED ORDER — DEXTROSE 50 % IV SOLN: 0.0000 mL | INTRAVENOUS | Status: DC | PRN

## 2019-03-19 MED ORDER — VANCOMYCIN HCL IN DEXTROSE 1-5 GM/200ML-% IV SOLN: 1000.0000 mg | INTRAVENOUS | Status: DC

## 2019-03-19 MED ORDER — DEXTROSE-NACL 5-0.45 % IV SOLN: INTRAVENOUS | Status: DC

## 2019-03-19 MED ORDER — ACETAMINOPHEN 325 MG PO TABS
650.0000 mg | ORAL_TABLET | Freq: Four times a day (QID) | ORAL | Status: DC | PRN
  Administered 2019-03-19: 650 mg via ORAL
  Filled 2019-03-19: qty 2

## 2019-03-19 MED ORDER — SODIUM CHLORIDE 0.9 % IV SOLN: INTRAVENOUS | Status: DC

## 2019-03-19 MED ORDER — INSULIN REGULAR(HUMAN) IN NACL 100-0.9 UT/100ML-% IV SOLN
INTRAVENOUS | Status: DC
  Administered 2019-03-19: 6.5 [IU]/h via INTRAVENOUS

## 2019-03-19 MED ORDER — HYDROCODONE-ACETAMINOPHEN 5-325 MG PO TABS
1.0000 | ORAL_TABLET | ORAL | Status: DC | PRN
  Administered 2019-03-19: 2 via ORAL
  Filled 2019-03-19: qty 2

## 2019-03-19 MED ORDER — SODIUM CHLORIDE 0.9 % IV BOLUS: 1000.0000 mL | INTRAVENOUS | Status: AC

## 2019-03-19 MED ORDER — ONDANSETRON HCL 4 MG/2ML IJ SOLN: 4.0000 mg | Freq: Four times a day (QID) | INTRAMUSCULAR | Status: DC | PRN

## 2019-03-19 NOTE — ED Notes (Signed)
Patient transported to CT 

## 2019-03-19 NOTE — ED Notes (Signed)
Pt states his ride is outside and he's leaving [facility]a.  MD notifed.

## 2019-03-19 NOTE — Progress Notes (Signed)
Inpatient Diabetes Program Recommendations  AACE/ADA: New Consensus Statement on Inpatient Glycemic Control (2015)  Target Ranges:  Prepandial:   less than 140 mg/dL      Peak postprandial:   less than 180 mg/dL (1-2 hours)      Critically ill patients:  140 - 180 mg/dL   Results for RHANDY, MORELOCK (MRN [id number]) as of 03/19/2019 07:10  Ref. Range 03/18/2019 19:52  Sodium Latest Ref Range: 135 - 145 mmol/L 128 (L)  Potassium Latest Ref Range: 3.5 - 5.1 mmol/L 5.6 (H)  Chloride Latest Ref Range: 98 - 111 mmol/L 92 (L)  CO2 Latest Ref Range: 22 - 32 mmol/L 10 (L)  Glucose Latest Ref Range: 70 - 99 mg/dL 606 (HH)  BUN Latest Ref Range: 6 - 20 mg/dL 37 (H)  Creatinine Latest Ref Range: 0.61 - 1.24 mg/dL 2.71 (H)  Calcium Latest Ref Range: 8.9 - 10.3 mg/dL 9.2  Anion gap Latest Ref Range: 5 - 15  26 (H)   Results for REDGE, HARTT (MRN [id number]) as of 03/19/2019 07:10  Ref. Range 06/24/2018 09:17 10/01/2018 14:00 10/01/2018 14:00 03/19/2019 04:36  Hemoglobin A1C Latest Ref Range: 4.8 - 5.6 % 12.6 (A) Pend 12.7 (A) 10.0 (H)  (240 mg/dl)    Admit with: DKA/ Right arm cellulitis   History: DM, CKD, IV Drug Abuse  Home DM Meds: Lantus 40 units Daily       Humalog 4-20 units TID per SSI  Current Orders: IV Insulin Drip     IV Insulin Drip started at 12:30am today.   MD- Note 5:25am BMETshows the following: Glucose 206 mg/dl Anion Gap 10 CO2 level 21 Beta-Hydroxybutyric Acid level at 4:36am was 0.56 mmol/L  When you allow pt to transition to SQ Insulin, please consider the following:   1. Lantus 32 units Daily--(80% total home dose to start)  Make sure to give Lantus at least 1-2 hours prior to d/c of the IV Insulin Drip  2. Start Novolog Moderate Correction Scale/ SSI (0-15 units) Q4 hours     --Will follow patient during hospitalization--  Wyn Quaker RN, MSN, CDE Diabetes Coordinator Inpatient Glycemic Control Team Team Pager: [phone] (8a-5p)

## 2019-03-19 NOTE — ED Notes (Signed)
Pt made aware of need for urine sample.  

## 2019-03-19 NOTE — ED Notes (Signed)
MD Opyd made aware of inability to obtain bloodwork from [facility]. Will obtain necessary bloodwork during next-due CBG

## 2019-03-19 NOTE — ED Notes (Signed)
MD made aware of inability to get 2nd set of blood cultures

## 2019-03-19 NOTE — H&P (Signed)
History and Physical    Luke [name] QRF:[id number] DOB: [birth year] DOA: 03/18/2019  PCP: Gildardo Pounds, NP   Patient coming from: Home   Chief Complaint: Fatigue, N/V, "high" CBG   HPI: Luke [name] is a 38 y.o. male with medical history significant for insulin-dependent diabetes mellitus, chronic kidney disease, hypothyroidism, and IV drug abuse, now presenting to the emergency department for evaluation of nausea, vomiting, fatigue, and elevated glucose.  Patient reports development of nausea with nonbloody vomiting and fatigue over the past 1 day.  He has not had much abdominal pain or diarrhea, and denies any fevers or chills, but has had recurrent vomiting and general fatigue.  Glucometer has been reading "high."  He denies any fevers, chills, cough, or shortness of breath.  Reports chronic wounds to his forearms where he injects heroin, and notes recent increase in pain and swelling at the right forearm with some purulent drainage.  ED Course: Upon arrival to the ED, patient is found to be afebrile, saturating 90s on room air, tachycardic in the 110s, and with stable blood pressure.  EKG features sinus tachycardia with rate 115.  Chemistry panel is notable for creatinine 2.71, glucose 606, bicarbonate 10, and anion gap 26.  CBC features a leukocytosis to 22,000 and a thrombocytosis with platelets 718,000.  Urinalysis notable for ketonuria, proteinuria, and glucosuria.  Patient was given IV fluids and started on insulin infusion in the emergency department.  Blood cultures, antibiotics, COVID-19 screening test, and CT right arm have all been ordered by the ED physician and in process.  Hospitalist asked to admit.  Review of Systems:  All other systems reviewed and apart from HPI, are negative.  Past Medical History:  Diagnosis Date  . Anemia   . Anxiety  Dx 2008  . Chronic kidney disease   . Diabetes mellitus without complication ([place]) Dx 8264  . DKA (diabetic ketoacidoses) ([place])  07/21/2017  . GERD (gastroesophageal reflux disease) Dx 2008  . Headache(784.0)   . Hypertension   . Pneumonia   . Seizures ([place]) 2011    x 2 in lifetime. on Dilantin for a while.   . Substance abuse ([place]) 2013    heroin use, multiple relapses    Past Surgical History:  Procedure Laterality Date  . AMPUTATION Left 09/05/2018   Procedure: LEFT GREAT TOE AMPUTATION;  Surgeon: [name], MD;  Location: [place];  Service: Orthopedics;  Laterality: Left;  . I & D EXTREMITY Left 10/11/2012   Procedure: IRRIGATION AND DEBRIDEMENT ABSCESS FOREARM;  Surgeon: Linna Hoff, MD;  Location: [place];  Service: Orthopedics;  Laterality: Left;  . I & D EXTREMITY Left 10/12/2012   Procedure: IRRIGATION AND DEBRIDEMENT FOREARM;  Surgeon: Linna Hoff, MD;  Location: [place];  Service: Orthopedics;  Laterality: Left;  . I & D EXTREMITY Left 10/14/2012   Procedure: incision and drainage left forearm;  Surgeon: Linna Hoff, MD;  Location: [place];  Service: Orthopedics;  Laterality: Left;  . I & D EXTREMITY Left 10/16/2012   Procedure: IRRIGATION AND DEBRIDEMENT LEFT FOREARM;  Surgeon: Linna Hoff, MD;  Location: [place];  Service: Orthopedics;  Laterality: Left;  . I & D EXTREMITY Left 10/20/2012   Procedure: INCISION AND DRAINAGE AND DEBRIDEMENT LEFT  FOREARM;  Surgeon: Linna Hoff, MD;  Location: [place];  Service: Orthopedics;  Laterality: Left;  . INCISION AND DRAINAGE OF WOUND Bilateral 11/29/2017   Procedure: DEBRIDEMENT BILATERAL FEET, DEBRIDEMENT LEFT ANKLE, AND  APPLY WOUND VAC;  Surgeon: [name], MD;  Location: [place];  Service: Orthopedics;  Laterality: Bilateral;  . IRRIGATION AND DEBRIDEMENT ABSCESS     Hx: of left arm abscess related to drug use   . TEE WITHOUT CARDIOVERSION N/A 11/28/2017   Procedure: TRANSESOPHAGEAL ECHOCARDIOGRAM (TEE);  Surgeon: Sanda Klein, MD;  Location: [facility] ENDOSCOPY;  Service: Cardiovascular;  Laterality: N/A;     reports that he has been smoking cigarettes.  He has been smoking about 1.00 pack per day. He has never used smokeless tobacco. He reports previous drug use. Drugs: Cocaine, IV, and Heroin. He reports that he does not drink alcohol.  No Known Allergies  Family History  Problem Relation Age of Onset  . Diabetes Father   . Heart disease Father   . Mental illness Sister   . Cancer Neg Hx      Prior to Admission medications   Medication Sig Start Date End Date Taking? Authorizing Provider  acetaminophen (TYLENOL) 325 MG tablet Take 2 tablets (650 mg total) by mouth every 6 (six) hours as needed for mild pain (or Fever >/= 101). 12/19/16   Debbe Odea, MD  amLODipine (NORVASC) 5 MG tablet Take 1 tablet (5 mg total) by mouth daily. 10/01/18   Gildardo Pounds, NP  atorvastatin (LIPITOR) 20 MG tablet Take 1 tablet (20 mg total) by mouth daily. 10/02/18   Gildardo Pounds, NP  Blood Glucose Monitoring Suppl (TRUE METRIX METER) DEVI 1 kit by Does not apply route as directed. Use as directed 06/07/15   Boykin Nearing, MD  DULoxetine (CYMBALTA) 30 MG capsule Take 1 capsule (30 mg total) by mouth daily. 10/01/18 10/31/18  Gildardo Pounds, NP  gabapentin (NEURONTIN) 400 MG capsule TAKE 1 CAPSULE (400 MG TOTAL) BY MOUTH 3 (THREE) TIMES DAILY. 12/12/18 01/11/19  Gildardo Pounds, NP  glucose blood test strip Use as instructed 03/24/18   Gildardo Pounds, NP  Insulin Glargine (LANTUS SOLOSTAR) 100 UNIT/ML Solostar Pen Inject 40 Units into the skin 2 (two) times daily. 06/24/18   Gildardo Pounds, NP  insulin lispro (HUMALOG KWIKPEN) 100 UNIT/ML KwikPen Inject 0.15 mLs (15 Units total) into the skin 3 (three) times daily. 01/23/19   Charlott Rakes, MD  Insulin Syringe-Needle U-100 (TRUEPLUS INSULIN SYRINGE) 31G X 5/16" 0.5 ML MISC USE AS DIRECTED. 06/24/18   Gildardo Pounds, NP  levothyroxine (SYNTHROID) 50 MCG tablet TAKE 1 TABLET (50 MCG TOTAL) BY MOUTH DAILY. 12/30/18   Charlott Rakes, MD  lisinopril (ZESTRIL) 10 MG tablet Take 1 tablet (10 mg total) by  mouth daily. 10/01/18 12/30/18  Gildardo Pounds, NP  nitroGLYCERIN (NITRODUR - DOSED IN MG/24 HR) 0.2 mg/hr patch Place 1 patch (0.2 mg total) onto the skin daily. 09/18/18   [name], MD  omeprazole (PRILOSEC) 20 MG capsule Take 1 capsule (20 mg total) by mouth daily. 03/24/18 06/22/18  Gildardo Pounds, NP  oxyCODONE-acetaminophen (PERCOCET) 5-325 MG tablet Take 1 tablet by mouth every 4 (four) hours as needed for moderate pain or severe pain. 09/12/18 09/12/19  Rayburn, Neta Mends, PA-C  oxyCODONE-acetaminophen (PERCOCET/ROXICET) 5-325 MG tablet Take 1 tablet by mouth every 4 (four) hours as needed for severe pain. 09/18/18   [name], MD  pentoxifylline (TRENTAL) 400 MG CR tablet TAKE 1 TABLET (400 MG TOTAL) BY MOUTH 3 (THREE) TIMES DAILY WITH MEALS. 11/11/18   [name], MD  sertraline (ZOLOFT) 50 MG tablet TAKE 1 TABLET (50 MG TOTAL)  BY MOUTH DAILY. 12/19/18   Charlott Rakes, MD  sulfamethoxazole-trimethoprim (BACTRIM DS) 800-160 MG tablet Take 1 tablet by mouth 2 (two) times daily. 11/25/18   [name], MD  TRUEPLUS LANCETS 28G MISC Use as directed 01/28/17   Alfonse Spruce, FNP    Physical Exam: Vitals:   03/18/19 1925 03/18/19 2218 03/18/19 2301 03/19/19 0027  BP:  140/62 135/75   Pulse: (!) (P) 130 (!) 117 71   Resp: (P) _0 Temp: (P) 98.3 F (36.8 C) 98.2 F (36.8 C) 98.9 F (37.2 C)   TempSrc: (P) Oral Oral Oral   SpO2: (P) 100% 100% 94%   Weight:    77.1 kg  Height:    5' 9" (1.753 m)    Constitutional: NAD, calm  Eyes: PERTLA, lids and conjunctivae normal ENMT: Mucous membranes are moist. Posterior pharynx clear of any exudate or lesions.   Neck: normal, supple, no masses, no thyromegaly Respiratory: no wheezing, no crackles. Normal respiratory effort. No accessory muscle use.  Cardiovascular: rate ~110 and regular. No extremity edema.   Abdomen: No distension, no tenderness, soft. Bowel sounds active.  Musculoskeletal: no clubbing /  cyanosis. No joint deformity upper and lower extremities.  Skin: Ulcerations about UE's bilaterally with discoloration, edema, and tenderness involving right forearm where there is some purulent drainage expressed. Warm, dry, well-perfused. Neurologic: No facial asymmetry. Sensation intact. Moving all extremities.  Psychiatric: Alert, appropriate throughout interview and exam. Pleasant, cooperative.     Labs on Admission: I have personally reviewed following labs and imaging studies  CBC: Recent Labs  Lab 03/18/19 1952 03/19/19 0042  WBC 22.0*  --   HGB 13.0 10.5*  HCT 43.9 31.0*  MCV 82.2  --   PLT 718*  --    Basic Metabolic Panel: Recent Labs  Lab 03/18/19 1952 03/19/19 0042  NA 128* 125*  K 5.6* 5.3*  CL 92*  --   CO2 10*  --   GLUCOSE 606*  --   BUN 37*  --   CREATININE 2.71*  --   CALCIUM 9.2  --    GFR: Estimated Creatinine Clearance: 37 mL/min (A) (by C-G formula based on SCr of 2.71 mg/dL (H)). Liver Function Tests: No results for input(s): AST, ALT, ALKPHOS, BILITOT, PROT, ALBUMIN in the last 168 hours. No results for input(s): LIPASE, AMYLASE in the last 168 hours. No results for input(s): AMMONIA in the last 168 hours. Coagulation Profile: Recent Labs  Lab 03/19/19 0045  INR 1.1   Cardiac Enzymes: No results for input(s): CKTOTAL, CKMB, CKMBINDEX, TROPONINI in the last 168 hours. BNP (last 3 results) No results for input(s): PROBNP in the last 8760 hours. HbA1C: No results for input(s): HGBA1C in the last 72 hours. CBG: Recent Labs  Lab 03/18/19 1930 03/18/19 2304 03/19/19 0014 03/19/19 0112  GLUCAP >600* 560* 574* 574*   Lipid Profile: No results for input(s): CHOL, HDL, LDLCALC, TRIG, CHOLHDL, LDLDIRECT in the last 72 hours. Thyroid Function Tests: No results for input(s): TSH, T4TOTAL, FREET4, T3FREE, THYROIDAB in the last 72 hours. Anemia Panel: No results for input(s): VITAMINB12, FOLATE, FERRITIN, TIBC, IRON, RETICCTPCT in the last 72  hours. Urine analysis:    Component Value Date/Time   COLORURINE STRAW (A) 03/18/2019 1900   APPEARANCEUR CLEAR 03/18/2019 1900   APPEARANCEUR Clear 04/11/2014 0138   LABSPEC 1.018 03/18/2019 1900   LABSPEC 1.026 04/11/2014 0138   PHURINE 5.0 03/18/2019 1900   GLUCOSEU >=500 (A) 03/18/2019 1900  GLUCOSEU >=500 04/11/2014 0138   HGBUR SMALL (A) 03/18/2019 1900   BILIRUBINUR NEGATIVE 03/18/2019 1900   BILIRUBINUR negative 03/24/2018 1032   BILIRUBINUR neg 02/18/2017 0946   BILIRUBINUR Negative 04/11/2014 0138   KETONESUR 80 (A) 03/18/2019 1900   PROTEINUR 100 (A) 03/18/2019 1900   UROBILINOGEN 0.2 03/24/2018 1032   UROBILINOGEN 0.2 12/10/2013 1221   NITRITE NEGATIVE 03/18/2019 1900   LEUKOCYTESUR NEGATIVE 03/18/2019 1900   LEUKOCYTESUR Negative 04/11/2014 0138   Sepsis Labs: _0 (procalcitonin:4,lacticidven:4) )No results found for this or any previous visit (from the past 240 hour(s)).   Radiological Exams on Admission: No results found.  EKG: Independently reviewed. Sinus tachycardia (rate 115), PVC, repolarization abnormality.   Assessment/Plan   1. DKA; uncontrolled IDDM  - Patient has hx of uncontrolled IDDM with A1c 12.7% in July 2020, now presents with one day of fatigue, N/V, and elevated CBGs, and is found to be in DKA   - He was started on IVF and insulin infusion in ED   - Continue IVF and insulin infusion with frequent CBG's and serial chem panels until DKA resolves    2. Right arm cellulitis  - Patient reports increase pain, swelling, and drainage from right forearm where he injects IV drugs  - He denies fevers or chills and BP has been normal; there is marked leukocytosis, mild lactate elevation, and tachycardia in ED, all of which could be explained by DKA but difficult to exclude early sepsis  - Blood cultures, IVF, empiric antibiotics, and CT right arm have been ordered by ED physician  - Follow-up CT findings, continue antibiotics with vancomycin  for now, follow cultures and clinical course    3. IV drug abuse  - Patient acknowledges ongoing IV heroin abuse, reports desire & plan to quit, is open to hearing about any possible resources to help him, and SW consulted    4. Chronic kidney disease  - SCr is 2.71 on admission with hx of CKD but unclear baseline  - There is likely a degree of acute prerenal azotemia in setting of DKA/osmotic diuresis  - He is being fluid-resuscitated in ED and will be continued on IVF hydration with medications to be renally-dosed and serial chem panels    5. Hyperkalemia  - Serum potassium is 5.6 on admission  - Anticipate improvement with continued insulin and IVF  - Continue cardiac monitoring, insulin, IVF, serial chem panels     DVT prophylaxis: SCD's  Code Status: Full  Family Communication: Discussed with patient  Consults called: None  Admission status: Observation     Vianne Bulls, MD [facility] Pager [phone]  If 7PM-7AM, please contact night-coverage [email or web address] Password [email or web address]  03/19/2019, 1:16 AM

## 2019-03-19 NOTE — Discharge Summary (Signed)
Following admission, patient's anion gap corrected and he was able to be weaned off insulin drip.  He remained calm and then suddenly, he informed the nurse that he was leaving at approximately 9:15 AM.  He said his ride was here and that he was leaving [place].  Nursing informed me and I did not have a chance to see or examine the patient.  However, I did ask the nurse if she could find out from the patient pharmacy that I could call in oral antibiotics for his cellulitis.  Patient told her the [facility] on [address].  I called a prescription of doxycycline 100 mg twice a day x5 days with no refills to this pharmacy.  Hopefully the patient will take this and also follow-up with the [facility].

## 2019-03-19 NOTE — Progress Notes (Signed)
Pharmacy Antibiotic Note  Luke [name] is a 38 y.o. male admitted on 03/18/2019 with cellulitis.  Pharmacy has been consulted for Vancomycin dosing. WBC elevated. Noted renal dysfunction.   Plan: Vancomycin 1000 mg IV q24h >>Estimated AUC: 477 Trend WBC, temp, renal function  F/U infectious work-up Drug levels as indicated   Height: 5\' 9"  (175.3 cm) Weight: 170 lb (77.1 kg) IBW/kg (Calculated) : 70.7  Temp (24hrs), Avg:98.5 F (36.9 C), Min:98.2 F (36.8 C), Max:98.9 F (37.2 C)  Recent Labs  Lab 03/18/19 1952 03/18/19 2007 03/19/19 0046  WBC 22.0*  --   --   CREATININE 2.71*  --   --   LATICACIDVEN  --  2.0* 1.5    Estimated Creatinine Clearance: 37 mL/min (A) (by C-G formula based on SCr of 2.71 mg/dL (H)).    No Known Allergies  Narda Bonds, PharmD, BCPS Clinical Pharmacist Phone: [phone]

## 2019-03-20 LAB — URINE CULTURE: Culture: NO GROWTH

## 2019-03-21 LAB — CULTURE, BLOOD (ROUTINE X 2): Special Requests: ADEQUATE

## 2019-03-26 ENCOUNTER — Other Ambulatory Visit: Payer: Self-pay | Admitting: Nurse Practitioner

## 2019-03-26 DIAGNOSIS — IMO0002 Reserved for concepts with insufficient information to code with codable children: Secondary | ICD-10-CM

## 2019-03-26 DIAGNOSIS — E1065 Type 1 diabetes mellitus with hyperglycemia: Secondary | ICD-10-CM

## 2019-03-27 MED FILL — LANTUS SOLOSTAR 100 UNITS/M: 100 | 18 days supply | Qty: 15 | Fill #0

## 2019-04-03 ENCOUNTER — Other Ambulatory Visit: Payer: Self-pay | Admitting: Nurse Practitioner

## 2019-04-03 DIAGNOSIS — E1065 Type 1 diabetes mellitus with hyperglycemia: Secondary | ICD-10-CM

## 2019-04-03 DIAGNOSIS — E039 Hypothyroidism, unspecified: Secondary | ICD-10-CM

## 2019-04-03 DIAGNOSIS — IMO0002 Reserved for concepts with insufficient information to code with codable children: Secondary | ICD-10-CM

## 2019-04-03 MED FILL — LEVOTHYROXINE 50 MCG TABLET: 50 | 30 days supply | Qty: 30 | Fill #0

## 2019-04-03 MED FILL — DULoxetine HCL 30 MG CPEP: 30 | 30 days supply | Qty: 30 | Fill #0

## 2019-04-07 ENCOUNTER — Encounter: Payer: Self-pay | Admitting: Nurse Practitioner

## 2019-04-20 ENCOUNTER — Other Ambulatory Visit: Payer: Self-pay

## 2019-04-20 ENCOUNTER — Encounter: Payer: Self-pay | Admitting: Nurse Practitioner

## 2019-04-20 ENCOUNTER — Ambulatory Visit: Payer: Commercial Managed Care - PPO | Attending: Nurse Practitioner | Admitting: Nurse Practitioner

## 2019-04-20 DIAGNOSIS — I38 Endocarditis, valve unspecified: Secondary | ICD-10-CM

## 2019-04-20 DIAGNOSIS — E1065 Type 1 diabetes mellitus with hyperglycemia: Secondary | ICD-10-CM | POA: Diagnosis not present

## 2019-04-20 DIAGNOSIS — L0889 Other specified local infections of the skin and subcutaneous tissue: Secondary | ICD-10-CM

## 2019-04-20 DIAGNOSIS — R2242 Localized swelling, mass and lump, left lower limb: Secondary | ICD-10-CM

## 2019-04-20 DIAGNOSIS — K219 Gastro-esophageal reflux disease without esophagitis: Secondary | ICD-10-CM

## 2019-04-20 DIAGNOSIS — B9689 Other specified bacterial agents as the cause of diseases classified elsewhere: Secondary | ICD-10-CM

## 2019-04-20 DIAGNOSIS — E039 Hypothyroidism, unspecified: Secondary | ICD-10-CM

## 2019-04-20 DIAGNOSIS — N182 Chronic kidney disease, stage 2 (mild): Secondary | ICD-10-CM

## 2019-04-20 DIAGNOSIS — I10 Essential (primary) hypertension: Secondary | ICD-10-CM

## 2019-04-20 DIAGNOSIS — IMO0002 Reserved for concepts with insufficient information to code with codable children: Secondary | ICD-10-CM

## 2019-04-20 HISTORY — DX: Endocarditis, valve unspecified: I38

## 2019-04-20 MED ORDER — INSULIN LISPRO (1 UNIT DIAL) 100 UNIT/ML (KWIKPEN): 4.0000 [IU] | PEN_INJECTOR | Freq: Three times a day (TID) | SUBCUTANEOUS | 6 refills | Status: DC

## 2019-04-20 MED ORDER — GABAPENTIN 400 MG PO CAPS: 400.0000 mg | ORAL_CAPSULE | Freq: Three times a day (TID) | ORAL | 2 refills | Status: DC

## 2019-04-20 MED ORDER — LANTUS SOLOSTAR 100 UNIT/ML ~~LOC~~ SOPN: PEN_INJECTOR | SUBCUTANEOUS | 6 refills | Status: DC

## 2019-04-20 MED ORDER — "INSULIN SYRINGE-NEEDLE U-100 31G X 5/16"" 0.5 ML MISC": 6 refills | Status: DC

## 2019-04-20 MED ORDER — AMLODIPINE BESYLATE 5 MG PO TABS: 5.0000 mg | ORAL_TABLET | Freq: Every day | ORAL | 3 refills | Status: DC

## 2019-04-20 MED ORDER — OMEPRAZOLE 20 MG PO CPDR: 20.0000 mg | DELAYED_RELEASE_CAPSULE | Freq: Every day | ORAL | 3 refills | Status: DC

## 2019-04-20 MED ORDER — GLUCOSE BLOOD VI STRP: ORAL_STRIP | 12 refills | Status: DC

## 2019-04-20 MED ORDER — ATORVASTATIN CALCIUM 20 MG PO TABS: 20.0000 mg | ORAL_TABLET | Freq: Every day | ORAL | 3 refills | Status: DC

## 2019-04-20 MED FILL — LANTUS SOLOSTAR 100 UNITS/M: 100 | 18 days supply | Qty: 15 | Fill #0

## 2019-04-20 MED FILL — GABAPENTIN 400 MG CAPSULE: 400 | 30 days supply | Qty: 90 | Fill #0

## 2019-04-20 MED FILL — AMLODIPINE BESYLATE 5 MG TA: 5 | 90 days supply | Qty: 90 | Fill #0

## 2019-04-20 NOTE — Progress Notes (Signed)
Virtual Visit via Telephone Note Due to national recommendations of social distancing due to [place] 19, telehealth visit is felt to be most appropriate for this patient at this time.  I discussed the limitations, risks, security and privacy concerns of performing an evaluation and management service by telephone and the availability of in person appointments. I also discussed with the patient that there may be a patient responsible charge related to this service. The patient expressed understanding and agreed to proceed.    I connected with Luke [name] on 04/20/19  at   2:10 PM EST  EDT by telephone and verified that I am speaking with the correct person using two identifiers.   Consent I discussed the limitations, risks, security and privacy concerns of performing an evaluation and management service by telephone and the availability of in person appointments. I also discussed with the patient that there may be a patient responsible charge related to this service. The patient expressed understanding and agreed to proceed.   Location of Patient: Private Residence    Location of Provider: [place] participating in Telemedicine visit: Geryl Rankins FNP-BC [place]    History of Present Illness: Telemedicine visit for: Skin Infection  Unfortunately Luke [name] states he relapsed with IV drug use on 03-13-2019. He was treated in the ED on 12-30 with IV insulin for DKA and IV abx for severe cellulitis.  He left AMA prior to being admitted for possible necrotizing fasciitis of his forearm.  Today he states his right arm is continuing to drain purulent malodorous drainage. Also endorses fevers/chills.   He has been instructed to return to the ED for treatment due to Positive blood cultures (strep viridans). He actually told me he had been discharged from the ED however it appears he left AMA.    Past Medical History:  Diagnosis Date  .  Anemia   . Anxiety  Dx 2008  . Chronic kidney disease   . Diabetes mellitus without complication ([place]) Dx 9937  . DKA (diabetic ketoacidoses) ([place]) 07/21/2017  . GERD (gastroesophageal reflux disease) Dx 2008  . Headache(784.0)   . Hypertension   . Pneumonia   . Seizures ([place]) 2011    x 2 in lifetime. on Dilantin for a while.   . Substance abuse ([place]) 2013    heroin use, multiple relapses    Past Surgical History:  Procedure Laterality Date  . AMPUTATION Left 09/05/2018   Procedure: LEFT GREAT TOE AMPUTATION;  Surgeon: [name], MD;  Location: [place];  Service: Orthopedics;  Laterality: Left;  . I & D EXTREMITY Left 10/11/2012   Procedure: IRRIGATION AND DEBRIDEMENT ABSCESS FOREARM;  Surgeon: Linna Hoff, MD;  Location: [place];  Service: Orthopedics;  Laterality: Left;  . I & D EXTREMITY Left 10/12/2012   Procedure: IRRIGATION AND DEBRIDEMENT FOREARM;  Surgeon: Linna Hoff, MD;  Location: [place];  Service: Orthopedics;  Laterality: Left;  . I & D EXTREMITY Left 10/14/2012   Procedure: incision and drainage left forearm;  Surgeon: Linna Hoff, MD;  Location: [place];  Service: Orthopedics;  Laterality: Left;  . I & D EXTREMITY Left 10/16/2012   Procedure: IRRIGATION AND DEBRIDEMENT LEFT FOREARM;  Surgeon: Linna Hoff, MD;  Location: [place];  Service: Orthopedics;  Laterality: Left;  . I & D EXTREMITY Left 10/20/2012   Procedure: INCISION AND DRAINAGE AND DEBRIDEMENT LEFT  FOREARM;  Surgeon: Linna Hoff,  MD;  Location: [place];  Service: Orthopedics;  Laterality: Left;  . INCISION AND DRAINAGE OF WOUND Bilateral 11/29/2017   Procedure: DEBRIDEMENT BILATERAL FEET, DEBRIDEMENT LEFT ANKLE, AND APPLY WOUND VAC;  Surgeon: [name], MD;  Location: [place];  Service: Orthopedics;  Laterality: Bilateral;  . IRRIGATION AND DEBRIDEMENT ABSCESS     Hx: of left arm abscess related to drug use   . TEE WITHOUT CARDIOVERSION N/A 11/28/2017   Procedure: TRANSESOPHAGEAL ECHOCARDIOGRAM (TEE);   Surgeon: Sanda Klein, MD;  Location[facility] ENDOSCOPY;  Service: Cardiovascular;  Laterality: N/A;    Family History  Problem Relation Age of Onset  . Diabetes Father   . Heart disease Father   . Mental illness Sister   . Cancer Neg Hx     Social History   Socioeconomic History  . Marital status: Married    Spouse name: Not on file  . Number of children: 5  . Years of education: some colle  . Highest education level: Not on file  Occupational History  . Occupation: Event set up    Employer: [employer]   . Occupation: Stencil for cars   Tobacco Use  . Smoking status: Current Every Day Smoker    Packs/day: 1.00    Types: Cigarettes  . Smokeless tobacco: Never Used  Substance and Sexual Activity  . Alcohol use: No    Alcohol/week: 0.0 standard drinks    Comment: occasional  . Drug use: Not Currently    Types: Cocaine, IV, Heroin    Comment: IVDU since Dad died in 06/10/2006 with 15 month period of sobriety 2018-2019, relapse Aug 2019.  Marland Kitchen Sexual activity: Yes    Partners: Female    Birth control/protection: None  Other Topics Concern  . Not on file  Social History Narrative   Lives with wife and two children age 26 and 25.          Social Determinants of Health   Financial Resource Strain:   . Difficulty of Paying Living Expenses: Not on file  Food Insecurity:   . Worried About Charity fundraiser in the Last Year: Not on file  . Ran Out of Food in the Last Year: Not on file  Transportation Needs:   . Lack of Transportation (Medical): Not on file  . Lack of Transportation (Non-Medical): Not on file  Physical Activity:   . Days of Exercise per Week: Not on file  . Minutes of Exercise per Session: Not on file  Stress:   . Feeling of Stress : Not on file  Social Connections:   . Frequency of Communication with Friends and Family: Not on file  . Frequency of Social Gatherings with Friends and Family: Not on file  . Attends Religious Services:  Not on file  . Active Member of Clubs or Organizations: Not on file  . Attends Archivist Meetings: Not on file  . Marital Status: Not on file     Observations/Objective: Awake, alert and oriented x 3   Review of Systems  Constitutional: Negative for fever, malaise/fatigue and weight loss.  HENT: Negative.  Negative for nosebleeds.   Eyes: Negative.  Negative for blurred vision, double vision and photophobia.  Respiratory: Negative.  Negative for cough and shortness of breath.   Cardiovascular: Negative.  Negative for chest pain, palpitations and leg swelling.       Foot swelling  Gastrointestinal: Positive for heartburn. Negative for nausea and vomiting.  Musculoskeletal:  Negative.  Negative for myalgias.  Skin:       SEE HPI  Neurological: Negative.  Negative for dizziness, focal weakness, seizures and headaches.  Psychiatric/Behavioral: Positive for substance abuse. Negative for suicidal ideas.    Assessment and Plan: Ibrahem was seen today for follow-up.  Diagnoses and all orders for this visit:  Bacterial skin infection -     CT FOREARM RIGHT WO CONTRAST; Future  Uncontrolled insulin dependent type 1 diabetes mellitus ([name]) -     atorvastatin (LIPITOR) 20 MG tablet; Take 1 tablet (20 mg total) by mouth daily. -     gabapentin (NEURONTIN) 400 MG capsule; Take 1 capsule (400 mg total) by mouth 3 (three) times daily. -     glucose blood test strip; Use as instructed -     insulin lispro (HUMALOG KWIKPEN) 100 UNIT/ML KwikPen; Inject 0.04-0.2 mLs (4-20 Units total) into the skin 3 (three) times daily. Per sliding scale -     Insulin Syringe-Needle U-100 (TRUEPLUS INSULIN SYRINGE) 31G X 5/16" 0.5 ML MISC; USE AS DIRECTED. -     Insulin Glargine (LANTUS SOLOSTAR) 100 UNIT/ML Solostar Pen; INJECT 40 UNITS INTO THE SKIN 2 (TWO) TIMES DAILY. Continue blood sugar control as discussed in office today, low carbohydrate diet, and regular physical exercise as tolerated, 150  minutes per week (30 min each day, 5 days per week, or 50 min 3 days per week). Keep blood sugar logs with fasting goal of 90-130 mg/dl, post prandial (after you eat) less than 180.  For Hypoglycemia: BS <60 and Hyperglycemia BS >400; contact the clinic ASAP. Annual eye exams and foot exams are recommended.   Localized swelling of left foot -     Uric Acid; Future  Hypothyroidism, unspecified type -     TSH; Future  Stage 2 chronic kidney disease -     Cancel: CMP14+EGFR -     CMP14+EGFR; Future  Essential hypertension -     amLODipine (NORVASC) 5 MG tablet; Take 1 tablet (5 mg total) by mouth daily. Continue all antihypertensives as prescribed.  Remember to bring in your blood pressure log with you for your follow up appointment.  DASH/Mediterranean Diets are healthier choices for HTN.    Gastroesophageal reflux disease without esophagitis -     omeprazole (PRILOSEC) 20 MG capsule; Take 1 capsule (20 mg total) by mouth daily. INSTRUCTIONS: Avoid GERD Triggers: acidic, spicy or fried foods, caffeine, coffee, sodas,  alcohol and chocolate.      Follow Up Instructions Return for LABS.     I discussed the assessment and treatment plan with the patient. The patient was provided an opportunity to ask questions and all were answered. The patient agreed with the plan and demonstrated an understanding of the instructions.   The patient was advised to call back or seek an in-person evaluation if the symptoms worsen or if the condition fails to improve as anticipated.  I provided 19 minutes of non-face-to-face time during this encounter including median intraservice time, reviewing previous notes, labs, imaging, medications and explaining diagnosis and management.  Gildardo Pounds, FNP-BC

## 2019-04-24 ENCOUNTER — Telehealth: Payer: Self-pay | Admitting: Nurse Practitioner

## 2019-04-24 NOTE — Telephone Encounter (Signed)
Pt call since he had an appt with the PCP over the phone on 04/20/19 and according to him, she suppose to sent him some antibiotic, I don't see any on his chart, please call him

## 2019-04-24 NOTE — Telephone Encounter (Signed)
Will route to PCP 

## 2019-04-27 ENCOUNTER — Other Ambulatory Visit: Payer: Self-pay | Admitting: Nurse Practitioner

## 2019-04-27 DIAGNOSIS — L97522 Non-pressure chronic ulcer of other part of left foot with fat layer exposed: Secondary | ICD-10-CM

## 2019-04-27 MED ORDER — SULFAMETHOXAZOLE-TRIMETHOPRIM 800-160 MG PO TABS: 1.0000 | ORAL_TABLET | Freq: Two times a day (BID) | ORAL | 0 refills | Status: AC

## 2019-04-27 MED FILL — SULFAMETHOXAZOLE-TMP DS TAB: 800-160 | 10 days supply | Qty: 20 | Fill #0

## 2019-04-27 NOTE — Telephone Encounter (Signed)
Bactrim sent to pharmacy however I am still waiting for his CT to be completed to rule out osteomyelitis

## 2019-05-03 ENCOUNTER — Encounter: Payer: Self-pay | Admitting: Nurse Practitioner

## 2019-05-04 ENCOUNTER — Telehealth: Payer: Self-pay

## 2019-05-04 NOTE — Telephone Encounter (Signed)
-----   Message from Gildardo Pounds, NP sent at 05/03/2019 11:38 PM EST ----- Please call Mr Frickey and let him know he needs to return to the hospital due to positive blood cultures which would require IV abx

## 2019-05-04 NOTE — Telephone Encounter (Signed)
CMA attempt to reach patient to inform to go the ED for his IV abx.  No answer and LVM.

## 2019-05-04 NOTE — Telephone Encounter (Signed)
-----   Message from Gildardo Pounds, NP sent at 05/03/2019 11:38 PM EST ----- Please call Mr Sera and let him know he needs to return to the hospital due to positive blood cultures which would require IV abx

## 2019-05-04 NOTE — Telephone Encounter (Signed)
CMA spoke to patient. Pt. Was aware PCP advised him to head to the ED to get his IV abx.  Pt. Stated he will go the ED to get treatment.

## 2019-05-08 ENCOUNTER — Other Ambulatory Visit: Payer: Self-pay

## 2019-05-08 ENCOUNTER — Emergency Department (HOSPITAL_COMMUNITY): Payer: Commercial Managed Care - PPO

## 2019-05-08 ENCOUNTER — Encounter (HOSPITAL_COMMUNITY): Payer: Self-pay

## 2019-05-08 ENCOUNTER — Inpatient Hospital Stay (HOSPITAL_COMMUNITY)
Admission: EM | Admit: 2019-05-08 | Discharge: 2019-05-16 | DRG: 871 | Discharge status: Against Medical Advice | Payer: Commercial Managed Care - PPO | Attending: Internal Medicine | Admitting: Internal Medicine

## 2019-05-08 DIAGNOSIS — A419 Sepsis, unspecified organism: Secondary | ICD-10-CM | POA: Diagnosis present

## 2019-05-08 DIAGNOSIS — D509 Iron deficiency anemia, unspecified: Secondary | ICD-10-CM | POA: Diagnosis present

## 2019-05-08 DIAGNOSIS — L02413 Cutaneous abscess of right upper limb: Secondary | ICD-10-CM | POA: Diagnosis present

## 2019-05-08 DIAGNOSIS — I059 Rheumatic mitral valve disease, unspecified: Secondary | ICD-10-CM

## 2019-05-08 DIAGNOSIS — E872 Acidosis: Secondary | ICD-10-CM | POA: Diagnosis present

## 2019-05-08 DIAGNOSIS — I058 Other rheumatic mitral valve diseases: Secondary | ICD-10-CM

## 2019-05-08 DIAGNOSIS — N183 Chronic kidney disease, stage 3 unspecified: Secondary | ICD-10-CM | POA: Diagnosis present

## 2019-05-08 DIAGNOSIS — E1022 Type 1 diabetes mellitus with diabetic chronic kidney disease: Secondary | ICD-10-CM | POA: Diagnosis present

## 2019-05-08 DIAGNOSIS — E1065 Type 1 diabetes mellitus with hyperglycemia: Secondary | ICD-10-CM | POA: Diagnosis present

## 2019-05-08 DIAGNOSIS — Z20822 Contact with and (suspected) exposure to covid-19: Secondary | ICD-10-CM | POA: Diagnosis present

## 2019-05-08 DIAGNOSIS — Z79899 Other long term (current) drug therapy: Secondary | ICD-10-CM

## 2019-05-08 DIAGNOSIS — E1051 Type 1 diabetes mellitus with diabetic peripheral angiopathy without gangrene: Secondary | ICD-10-CM | POA: Diagnosis present

## 2019-05-08 DIAGNOSIS — N1831 Chronic kidney disease, stage 3a: Secondary | ICD-10-CM | POA: Diagnosis present

## 2019-05-08 DIAGNOSIS — Z794 Long term (current) use of insulin: Secondary | ICD-10-CM

## 2019-05-08 DIAGNOSIS — R7881 Bacteremia: Secondary | ICD-10-CM

## 2019-05-08 DIAGNOSIS — E039 Hypothyroidism, unspecified: Secondary | ICD-10-CM | POA: Diagnosis present

## 2019-05-08 DIAGNOSIS — K219 Gastro-esophageal reflux disease without esophagitis: Secondary | ICD-10-CM | POA: Diagnosis present

## 2019-05-08 DIAGNOSIS — Z89412 Acquired absence of left great toe: Secondary | ICD-10-CM

## 2019-05-08 DIAGNOSIS — L0390 Cellulitis, unspecified: Secondary | ICD-10-CM

## 2019-05-08 DIAGNOSIS — F1110 Opioid abuse, uncomplicated: Secondary | ICD-10-CM | POA: Diagnosis present

## 2019-05-08 DIAGNOSIS — B1920 Unspecified viral hepatitis C without hepatic coma: Secondary | ICD-10-CM | POA: Diagnosis present

## 2019-05-08 DIAGNOSIS — N179 Acute kidney failure, unspecified: Secondary | ICD-10-CM | POA: Diagnosis present

## 2019-05-08 DIAGNOSIS — B182 Chronic viral hepatitis C: Secondary | ICD-10-CM | POA: Diagnosis present

## 2019-05-08 DIAGNOSIS — A408 Other streptococcal sepsis: Principal | ICD-10-CM | POA: Diagnosis present

## 2019-05-08 DIAGNOSIS — K0889 Other specified disorders of teeth and supporting structures: Secondary | ICD-10-CM | POA: Diagnosis present

## 2019-05-08 DIAGNOSIS — F1910 Other psychoactive substance abuse, uncomplicated: Secondary | ICD-10-CM | POA: Diagnosis present

## 2019-05-08 DIAGNOSIS — F17210 Nicotine dependence, cigarettes, uncomplicated: Secondary | ICD-10-CM | POA: Diagnosis present

## 2019-05-08 DIAGNOSIS — I129 Hypertensive chronic kidney disease with stage 1 through stage 4 chronic kidney disease, or unspecified chronic kidney disease: Secondary | ICD-10-CM | POA: Diagnosis present

## 2019-05-08 DIAGNOSIS — Z79890 Hormone replacement therapy (postmenopausal): Secondary | ICD-10-CM

## 2019-05-08 DIAGNOSIS — F419 Anxiety disorder, unspecified: Secondary | ICD-10-CM | POA: Diagnosis present

## 2019-05-08 DIAGNOSIS — E1042 Type 1 diabetes mellitus with diabetic polyneuropathy: Secondary | ICD-10-CM | POA: Diagnosis present

## 2019-05-08 DIAGNOSIS — I330 Acute and subacute infective endocarditis: Secondary | ICD-10-CM

## 2019-05-08 DIAGNOSIS — E10649 Type 1 diabetes mellitus with hypoglycemia without coma: Secondary | ICD-10-CM | POA: Diagnosis not present

## 2019-05-08 DIAGNOSIS — I10 Essential (primary) hypertension: Secondary | ICD-10-CM | POA: Diagnosis present

## 2019-05-08 LAB — COMPREHENSIVE METABOLIC PANEL
ALT: 17 U/L (ref 0–44)
AST: 21 U/L (ref 15–41)
Albumin: 2.9 g/dL — ABNORMAL LOW (ref 3.5–5.0)
Alkaline Phosphatase: 136 U/L — ABNORMAL HIGH (ref 38–126)
Anion gap: 15 (ref 5–15)
BUN: 27 mg/dL — ABNORMAL HIGH (ref 6–20)
CO2: 22 mmol/L (ref 22–32)
Calcium: 8.9 mg/dL (ref 8.9–10.3)
Chloride: 95 mmol/L — ABNORMAL LOW (ref 98–111)
Creatinine, Ser: 2.03 mg/dL — ABNORMAL HIGH (ref 0.61–1.24)
GFR calc Af Amer: 46 mL/min — ABNORMAL LOW (ref >60)
GFR calc non Af Amer: 40 mL/min — ABNORMAL LOW (ref >60)
Glucose, Bld: 272 mg/dL — ABNORMAL HIGH (ref 70–99)
Potassium: 3.6 mmol/L (ref 3.5–5.1)
Sodium: 132 mmol/L — ABNORMAL LOW (ref 135–145)
Total Bilirubin: 0.4 mg/dL (ref 0.3–1.2)
Total Protein: 7.7 g/dL (ref 6.5–8.1)

## 2019-05-08 LAB — CBC WITH DIFFERENTIAL/PLATELET
Abs Immature Granulocytes: 0.09 10*3/uL — ABNORMAL HIGH (ref 0.00–0.07)
Basophils Absolute: 0.1 10*3/uL (ref 0.0–0.1)
Basophils Relative: 0 %
Eosinophils Absolute: 0.2 10*3/uL (ref 0.0–0.5)
Eosinophils Relative: 1 %
HCT: 32 % — ABNORMAL LOW (ref 39.0–52.0)
Hemoglobin: 10.1 g/dL — ABNORMAL LOW (ref 13.0–17.0)
Immature Granulocytes: 0 %
Lymphocytes Relative: 24 %
Lymphs Abs: 4.8 10*3/uL — ABNORMAL HIGH (ref 0.7–4.0)
MCH: 23.8 pg — ABNORMAL LOW (ref 26.0–34.0)
MCHC: 31.6 g/dL (ref 30.0–36.0)
MCV: 75.3 fL — ABNORMAL LOW (ref 80.0–100.0)
Monocytes Absolute: 1.2 10*3/uL — ABNORMAL HIGH (ref 0.1–1.0)
Monocytes Relative: 6 %
Neutro Abs: 13.7 10*3/uL — ABNORMAL HIGH (ref 1.7–7.7)
Neutrophils Relative %: 69 %
Platelets: 592 10*3/uL — ABNORMAL HIGH (ref 150–400)
RBC: 4.25 MIL/uL (ref 4.22–5.81)
RDW: 14.3 % (ref 11.5–15.5)
WBC: 20.1 10*3/uL — ABNORMAL HIGH (ref 4.0–10.5)
nRBC: 0 % (ref 0.0–0.2)

## 2019-05-08 LAB — PROTIME-INR
INR: 1 (ref 0.8–1.2)
Prothrombin Time: 13 seconds (ref 11.4–15.2)

## 2019-05-08 LAB — LACTIC ACID, PLASMA: Lactic Acid, Venous: 2.2 mmol/L (ref 0.5–1.9)

## 2019-05-08 NOTE — ED Triage Notes (Signed)
Pt reports that he is a current IV drug user and went to see his PCP and told that his blood cultures were positive and he needed IV antibiotics.

## 2019-05-09 ENCOUNTER — Other Ambulatory Visit: Payer: Self-pay

## 2019-05-09 ENCOUNTER — Inpatient Hospital Stay (HOSPITAL_COMMUNITY): Payer: Commercial Managed Care - PPO

## 2019-05-09 DIAGNOSIS — I330 Acute and subacute infective endocarditis: Secondary | ICD-10-CM | POA: Diagnosis present

## 2019-05-09 DIAGNOSIS — Z79890 Hormone replacement therapy (postmenopausal): Secondary | ICD-10-CM | POA: Diagnosis not present

## 2019-05-09 DIAGNOSIS — N1832 Chronic kidney disease, stage 3b: Secondary | ICD-10-CM | POA: Diagnosis not present

## 2019-05-09 DIAGNOSIS — E1022 Type 1 diabetes mellitus with diabetic chronic kidney disease: Secondary | ICD-10-CM | POA: Diagnosis present

## 2019-05-09 DIAGNOSIS — E039 Hypothyroidism, unspecified: Secondary | ICD-10-CM | POA: Diagnosis present

## 2019-05-09 DIAGNOSIS — E11628 Type 2 diabetes mellitus with other skin complications: Secondary | ICD-10-CM | POA: Diagnosis not present

## 2019-05-09 DIAGNOSIS — L02413 Cutaneous abscess of right upper limb: Secondary | ICD-10-CM | POA: Diagnosis present

## 2019-05-09 DIAGNOSIS — F1110 Opioid abuse, uncomplicated: Secondary | ICD-10-CM

## 2019-05-09 DIAGNOSIS — R7881 Bacteremia: Secondary | ICD-10-CM

## 2019-05-09 DIAGNOSIS — L988 Other specified disorders of the skin and subcutaneous tissue: Secondary | ICD-10-CM | POA: Diagnosis not present

## 2019-05-09 DIAGNOSIS — E10649 Type 1 diabetes mellitus with hypoglycemia without coma: Secondary | ICD-10-CM | POA: Diagnosis not present

## 2019-05-09 DIAGNOSIS — M79604 Pain in right leg: Secondary | ICD-10-CM | POA: Diagnosis not present

## 2019-05-09 DIAGNOSIS — N179 Acute kidney failure, unspecified: Secondary | ICD-10-CM | POA: Diagnosis present

## 2019-05-09 DIAGNOSIS — Z794 Long term (current) use of insulin: Secondary | ICD-10-CM | POA: Diagnosis not present

## 2019-05-09 DIAGNOSIS — A409 Streptococcal sepsis, unspecified: Secondary | ICD-10-CM | POA: Diagnosis not present

## 2019-05-09 DIAGNOSIS — N1831 Chronic kidney disease, stage 3a: Secondary | ICD-10-CM | POA: Diagnosis present

## 2019-05-09 DIAGNOSIS — M7989 Other specified soft tissue disorders: Secondary | ICD-10-CM | POA: Diagnosis not present

## 2019-05-09 DIAGNOSIS — A419 Sepsis, unspecified organism: Secondary | ICD-10-CM | POA: Diagnosis not present

## 2019-05-09 DIAGNOSIS — I10 Essential (primary) hypertension: Secondary | ICD-10-CM

## 2019-05-09 DIAGNOSIS — L03115 Cellulitis of right lower limb: Secondary | ICD-10-CM | POA: Diagnosis not present

## 2019-05-09 DIAGNOSIS — F419 Anxiety disorder, unspecified: Secondary | ICD-10-CM | POA: Diagnosis present

## 2019-05-09 DIAGNOSIS — I351 Nonrheumatic aortic (valve) insufficiency: Secondary | ICD-10-CM | POA: Diagnosis not present

## 2019-05-09 DIAGNOSIS — F1910 Other psychoactive substance abuse, uncomplicated: Secondary | ICD-10-CM

## 2019-05-09 DIAGNOSIS — I340 Nonrheumatic mitral (valve) insufficiency: Secondary | ICD-10-CM | POA: Diagnosis not present

## 2019-05-09 DIAGNOSIS — B182 Chronic viral hepatitis C: Secondary | ICD-10-CM | POA: Diagnosis present

## 2019-05-09 DIAGNOSIS — E1065 Type 1 diabetes mellitus with hyperglycemia: Secondary | ICD-10-CM | POA: Diagnosis present

## 2019-05-09 DIAGNOSIS — E1042 Type 1 diabetes mellitus with diabetic polyneuropathy: Secondary | ICD-10-CM

## 2019-05-09 DIAGNOSIS — F17210 Nicotine dependence, cigarettes, uncomplicated: Secondary | ICD-10-CM | POA: Diagnosis present

## 2019-05-09 DIAGNOSIS — Z79899 Other long term (current) drug therapy: Secondary | ICD-10-CM | POA: Diagnosis not present

## 2019-05-09 DIAGNOSIS — A408 Other streptococcal sepsis: Secondary | ICD-10-CM | POA: Diagnosis present

## 2019-05-09 DIAGNOSIS — E872 Acidosis: Secondary | ICD-10-CM | POA: Diagnosis present

## 2019-05-09 DIAGNOSIS — K0889 Other specified disorders of teeth and supporting structures: Secondary | ICD-10-CM | POA: Diagnosis present

## 2019-05-09 DIAGNOSIS — K219 Gastro-esophageal reflux disease without esophagitis: Secondary | ICD-10-CM | POA: Diagnosis present

## 2019-05-09 DIAGNOSIS — Z20822 Contact with and (suspected) exposure to covid-19: Secondary | ICD-10-CM | POA: Diagnosis present

## 2019-05-09 DIAGNOSIS — I129 Hypertensive chronic kidney disease with stage 1 through stage 4 chronic kidney disease, or unspecified chronic kidney disease: Secondary | ICD-10-CM | POA: Diagnosis present

## 2019-05-09 DIAGNOSIS — D509 Iron deficiency anemia, unspecified: Secondary | ICD-10-CM | POA: Diagnosis present

## 2019-05-09 DIAGNOSIS — B955 Unspecified streptococcus as the cause of diseases classified elsewhere: Secondary | ICD-10-CM | POA: Diagnosis not present

## 2019-05-09 LAB — GLUCOSE, CAPILLARY
Glucose-Capillary: 63 mg/dL — ABNORMAL LOW (ref 70–99)
Glucose-Capillary: 68 mg/dL — ABNORMAL LOW (ref 70–99)
Glucose-Capillary: 187 mg/dL — ABNORMAL HIGH (ref 70–99)
Glucose-Capillary: 188 mg/dL — ABNORMAL HIGH (ref 70–99)
Glucose-Capillary: 114 mg/dL — ABNORMAL HIGH (ref 70–99)
Glucose-Capillary: 46 mg/dL — ABNORMAL LOW (ref 70–99)
Glucose-Capillary: 161 mg/dL — ABNORMAL HIGH (ref 70–99)

## 2019-05-09 LAB — CBC
HCT: 30.3 % — ABNORMAL LOW (ref 39.0–52.0)
Hemoglobin: 9.3 g/dL — ABNORMAL LOW (ref 13.0–17.0)
MCH: 23.5 pg — ABNORMAL LOW (ref 26.0–34.0)
MCHC: 30.7 g/dL (ref 30.0–36.0)
MCV: 76.5 fL — ABNORMAL LOW (ref 80.0–100.0)
Platelets: 495 10*3/uL — ABNORMAL HIGH (ref 150–400)
RBC: 3.96 MIL/uL — ABNORMAL LOW (ref 4.22–5.81)
RDW: 14.2 % (ref 11.5–15.5)
WBC: 18.2 10*3/uL — ABNORMAL HIGH (ref 4.0–10.5)
nRBC: 0 % (ref 0.0–0.2)

## 2019-05-09 LAB — FERRITIN: Ferritin: 150 ng/mL (ref 24–336)

## 2019-05-09 LAB — URINALYSIS, ROUTINE W REFLEX MICROSCOPIC
Bacteria, UA: NONE SEEN
Bilirubin Urine: NEGATIVE
Glucose, UA: 50 mg/dL — AB
Hgb urine dipstick: NEGATIVE
Ketones, ur: 5 mg/dL — AB
Leukocytes,Ua: NEGATIVE
Nitrite: NEGATIVE
Protein, ur: >=300 mg/dL — AB
Specific Gravity, Urine: 1.03 (ref 1.005–1.030)
pH: 5 (ref 5.0–8.0)

## 2019-05-09 LAB — BASIC METABOLIC PANEL
Anion gap: 13 (ref 5–15)
BUN: 26 mg/dL — ABNORMAL HIGH (ref 6–20)
CO2: 22 mmol/L (ref 22–32)
Calcium: 8.7 mg/dL — ABNORMAL LOW (ref 8.9–10.3)
Chloride: 98 mmol/L (ref 98–111)
Creatinine, Ser: 1.79 mg/dL — ABNORMAL HIGH (ref 0.61–1.24)
GFR calc Af Amer: 54 mL/min — ABNORMAL LOW (ref >60)
GFR calc non Af Amer: 47 mL/min — ABNORMAL LOW (ref >60)
Glucose, Bld: 148 mg/dL — ABNORMAL HIGH (ref 70–99)
Potassium: 3.5 mmol/L (ref 3.5–5.1)
Sodium: 133 mmol/L — ABNORMAL LOW (ref 135–145)

## 2019-05-09 LAB — IRON AND TIBC
Iron: 12 ug/dL — ABNORMAL LOW (ref 45–182)
Saturation Ratios: 5 % — ABNORMAL LOW (ref 17.9–39.5)
TIBC: 248 ug/dL — ABNORMAL LOW (ref 250–450)
UIBC: 236 ug/dL

## 2019-05-09 LAB — LACTIC ACID, PLASMA: Lactic Acid, Venous: 1.6 mmol/L (ref 0.5–1.9)

## 2019-05-09 LAB — TSH: TSH: 4.047 u[IU]/mL (ref 0.350–4.500)

## 2019-05-09 LAB — APTT: aPTT: 36 seconds (ref 24–36)

## 2019-05-09 LAB — SARS CORONAVIRUS 2 (TAT 6-24 HRS): SARS Coronavirus 2: NEGATIVE

## 2019-05-09 MED ORDER — SODIUM CHLORIDE 0.9 % IV SOLN
2.0000 g | Freq: Two times a day (BID) | INTRAVENOUS | Status: DC
  Administered 2019-05-09: 22:00:00 2 g via INTRAVENOUS
  Filled 2019-05-09 (×3): qty 2

## 2019-05-09 MED ORDER — LACTATED RINGERS IV BOLUS (SEPSIS)
500.0000 mL | Freq: Once | INTRAVENOUS | Status: AC
  Administered 2019-05-09: 500 mL via INTRAVENOUS

## 2019-05-09 MED ORDER — PANTOPRAZOLE SODIUM 40 MG PO TBEC
40.0000 mg | DELAYED_RELEASE_TABLET | Freq: Every day | ORAL | Status: DC
  Administered 2019-05-09 – 2019-05-16 (×8): 40 mg via ORAL
  Filled 2019-05-09 (×8): qty 1

## 2019-05-09 MED ORDER — SODIUM CHLORIDE 0.9 % IV SOLN
2.0000 g | Freq: Once | INTRAVENOUS | Status: AC
  Administered 2019-05-09: 2 g via INTRAVENOUS
  Filled 2019-05-09: qty 2

## 2019-05-09 MED ORDER — VANCOMYCIN HCL 1500 MG/300ML IV SOLN
1500.0000 mg | Freq: Once | INTRAVENOUS | Status: AC
  Administered 2019-05-09: 1500 mg via INTRAVENOUS
  Filled 2019-05-09: qty 300

## 2019-05-09 MED ORDER — SERTRALINE HCL 50 MG PO TABS
50.0000 mg | ORAL_TABLET | Freq: Every day | ORAL | Status: DC
  Administered 2019-05-09 – 2019-05-16 (×8): 50 mg via ORAL
  Filled 2019-05-09 (×8): qty 1

## 2019-05-09 MED ORDER — BUPRENORPHINE HCL-NALOXONE HCL 2-0.5 MG SL SUBL: 2.0000 | SUBLINGUAL_TABLET | SUBLINGUAL | Status: AC | PRN

## 2019-05-09 MED ORDER — VANCOMYCIN HCL 1250 MG/250ML IV SOLN
1250.0000 mg | INTRAVENOUS | Status: DC
  Administered 2019-05-10: 1250 mg via INTRAVENOUS
  Filled 2019-05-09: qty 250

## 2019-05-09 MED ORDER — INSULIN GLARGINE 100 UNIT/ML ~~LOC~~ SOLN
35.0000 [IU] | Freq: Two times a day (BID) | SUBCUTANEOUS | Status: DC
  Administered 2019-05-09 – 2019-05-10 (×2): 35 [IU] via SUBCUTANEOUS
  Filled 2019-05-09 (×3): qty 0.35

## 2019-05-09 MED ORDER — ONDANSETRON HCL 4 MG/2ML IJ SOLN: 4.0000 mg | Freq: Four times a day (QID) | INTRAMUSCULAR | Status: DC | PRN

## 2019-05-09 MED ORDER — DULOXETINE HCL 30 MG PO CPEP
30.0000 mg | ORAL_CAPSULE | Freq: Every day | ORAL | Status: DC
  Administered 2019-05-09 – 2019-05-16 (×8): 30 mg via ORAL
  Filled 2019-05-09 (×8): qty 1

## 2019-05-09 MED ORDER — SODIUM CHLORIDE 0.9 % IV SOLN
2.0000 g | Freq: Three times a day (TID) | INTRAVENOUS | Status: DC
  Administered 2019-05-09: 2 g via INTRAVENOUS
  Filled 2019-05-09 (×2): qty 2

## 2019-05-09 MED ORDER — LACTATED RINGERS IV BOLUS (SEPSIS)
1000.0000 mL | Freq: Once | INTRAVENOUS | Status: AC
  Administered 2019-05-09: 02:00:00 1000 mL via INTRAVENOUS

## 2019-05-09 MED ORDER — GABAPENTIN 400 MG PO CAPS
400.0000 mg | ORAL_CAPSULE | Freq: Three times a day (TID) | ORAL | Status: DC
  Administered 2019-05-09 – 2019-05-16 (×21): 400 mg via ORAL
  Filled 2019-05-09 (×21): qty 1

## 2019-05-09 MED ORDER — ENOXAPARIN SODIUM 40 MG/0.4ML ~~LOC~~ SOLN
40.0000 mg | SUBCUTANEOUS | Status: DC
  Administered 2019-05-09 – 2019-05-11 (×3): 40 mg via SUBCUTANEOUS
  Filled 2019-05-09 (×8): qty 0.4

## 2019-05-09 MED ORDER — AMLODIPINE BESYLATE 5 MG PO TABS
5.0000 mg | ORAL_TABLET | Freq: Every day | ORAL | Status: DC
  Administered 2019-05-09 – 2019-05-16 (×8): 5 mg via ORAL
  Filled 2019-05-09 (×8): qty 1

## 2019-05-09 MED ORDER — ACETAMINOPHEN 325 MG PO TABS
650.0000 mg | ORAL_TABLET | Freq: Four times a day (QID) | ORAL | Status: DC | PRN
  Administered 2019-05-10: 09:00:00 650 mg via ORAL
  Filled 2019-05-09: qty 2

## 2019-05-09 MED ORDER — BUPRENORPHINE HCL-NALOXONE HCL 8-2 MG SL SUBL
1.0000 | SUBLINGUAL_TABLET | Freq: Two times a day (BID) | SUBLINGUAL | Status: DC
  Administered 2019-05-10 – 2019-05-11 (×3): 1 via SUBLINGUAL
  Filled 2019-05-09 (×9): qty 1
  Filled 2019-05-09: qty 2
  Filled 2019-05-09 (×2): qty 1

## 2019-05-09 MED ORDER — ACETAMINOPHEN 650 MG RE SUPP: 650.0000 mg | Freq: Four times a day (QID) | RECTAL | Status: DC | PRN

## 2019-05-09 MED ORDER — LACTATED RINGERS IV BOLUS (SEPSIS)
1000.0000 mL | Freq: Once | INTRAVENOUS | Status: AC
  Administered 2019-05-09: 1000 mL via INTRAVENOUS

## 2019-05-09 MED ORDER — INSULIN ASPART 100 UNIT/ML ~~LOC~~ SOLN
3.0000 [IU] | Freq: Three times a day (TID) | SUBCUTANEOUS | Status: DC
  Administered 2019-05-09 – 2019-05-11 (×5): 3 [IU] via SUBCUTANEOUS

## 2019-05-09 MED ORDER — INSULIN GLARGINE 100 UNIT/ML ~~LOC~~ SOLN
40.0000 [IU] | Freq: Two times a day (BID) | SUBCUTANEOUS | Status: DC
  Administered 2019-05-09 (×2): 40 [IU] via SUBCUTANEOUS
  Filled 2019-05-09 (×3): qty 0.4

## 2019-05-09 MED ORDER — VANCOMYCIN HCL IN DEXTROSE 1-5 GM/200ML-% IV SOLN: 1000.0000 mg | Freq: Once | INTRAVENOUS | Status: DC

## 2019-05-09 MED ORDER — ONDANSETRON HCL 4 MG PO TABS: 4.0000 mg | ORAL_TABLET | Freq: Four times a day (QID) | ORAL | Status: DC | PRN

## 2019-05-09 MED ORDER — ATORVASTATIN CALCIUM 10 MG PO TABS
20.0000 mg | ORAL_TABLET | Freq: Every day | ORAL | Status: DC
  Administered 2019-05-09 – 2019-05-16 (×8): 20 mg via ORAL
  Filled 2019-05-09 (×8): qty 2

## 2019-05-09 MED ORDER — LEVOTHYROXINE SODIUM 50 MCG PO TABS
50.0000 ug | ORAL_TABLET | Freq: Every day | ORAL | Status: DC
  Administered 2019-05-09 – 2019-05-16 (×8): 50 ug via ORAL
  Filled 2019-05-09 (×8): qty 1

## 2019-05-09 MED ORDER — METRONIDAZOLE IN NACL 5-0.79 MG/ML-% IV SOLN
500.0000 mg | Freq: Once | INTRAVENOUS | Status: AC
  Administered 2019-05-09: 01:00:00 500 mg via INTRAVENOUS
  Filled 2019-05-09: qty 100

## 2019-05-09 MED ORDER — INSULIN ASPART 100 UNIT/ML ~~LOC~~ SOLN
0.0000 [IU] | Freq: Three times a day (TID) | SUBCUTANEOUS | Status: DC
  Administered 2019-05-09: 3 [IU] via SUBCUTANEOUS
  Administered 2019-05-10: 2 [IU] via SUBCUTANEOUS
  Administered 2019-05-10: 11 [IU] via SUBCUTANEOUS
  Administered 2019-05-11: 14:00:00 5 [IU] via SUBCUTANEOUS
  Administered 2019-05-11: 3 [IU] via SUBCUTANEOUS
  Administered 2019-05-11: 8 [IU] via SUBCUTANEOUS
  Administered 2019-05-12: 18:00:00 15 [IU] via SUBCUTANEOUS

## 2019-05-09 NOTE — H&P (Signed)
History and Physical    Luke [name] OJJ:[id number] DOB: 1981/02/06 DOA: 05/08/2019  PCP: Gildardo Pounds, NP  Patient coming from: Home  I have personally briefly reviewed patient's old medical records in [place]  Chief Complaint: Bacteremia  HPI: Luke [name] is a 39 y.o. male with medical history significant of ongoing polysubstance abuse, IVDU with heroin, DM1, HTN, prior ostomyelitis of ankle.  Pt last admitted with DKA on Dec 30th-31st when he left AMA.  Also had R arm cellulitis at that time.  Left AMA on the 31st.  BCx drawn on the 30th would ultimately come back positive for Strep viridans.  Patient initially lost to follow up, but finally initiated a telehelth visit with [facility] for recurrent skin infections on 2/1.  On 2/14 patient notified that he had BCx from 12/31 and it was recommended he go to ER.  Finally came in to ER tonight.   ED Course: HR 122, WBC 20k.   Given 2.5L IVF bolus and started on empiric cefepime / vanc.  Repeat BCx x2 ordered and pending.  Review of Systems: As per HPI, otherwise all review of systems negative.  Past Medical History:  Diagnosis Date  . Anemia   . Anxiety  Dx 2008  . Chronic kidney disease   . Diabetes mellitus without complication ([place]) Dx 9371  . DKA (diabetic ketoacidoses) ([place]) 07/21/2017  . GERD (gastroesophageal reflux disease) Dx 2008  . Headache(784.0)   . Hypertension   . Pneumonia   . Seizures ([place]) 2011    x 2 in lifetime. on Dilantin for a while.   . Substance abuse ([place]) 2013    heroin use, multiple relapses    Past Surgical History:  Procedure Laterality Date  . AMPUTATION Left 09/05/2018   Procedure: LEFT GREAT TOE AMPUTATION;  Surgeon: [name], MD;  Location: [place];  Service: Orthopedics;  Laterality: Left;  . I & D EXTREMITY Left 10/11/2012   Procedure: IRRIGATION AND DEBRIDEMENT ABSCESS FOREARM;  Surgeon: Linna Hoff, MD;  Location: [place];  Service: Orthopedics;  Laterality: Left;  .  I & D EXTREMITY Left 10/12/2012   Procedure: IRRIGATION AND DEBRIDEMENT FOREARM;  Surgeon: Linna Hoff, MD;  Location: [place];  Service: Orthopedics;  Laterality: Left;  . I & D EXTREMITY Left 10/14/2012   Procedure: incision and drainage left forearm;  Surgeon: Linna Hoff, MD;  Location: [facility];  Service: Orthopedics;  Laterality: Left;  . I & D EXTREMITY Left 10/16/2012   Procedure: IRRIGATION AND DEBRIDEMENT LEFT FOREARM;  Surgeon: Linna Hoff, MD;  Location: [facility];  Service: Orthopedics;  Laterality: Left;  . I & D EXTREMITY Left 10/20/2012   Procedure: INCISION AND DRAINAGE AND DEBRIDEMENT LEFT  FOREARM;  Surgeon: Linna Hoff, MD;  Location: [facility];  Service: Orthopedics;  Laterality: Left;  . INCISION AND DRAINAGE OF WOUND Bilateral 11/29/2017   Procedure: DEBRIDEMENT BILATERAL FEET, DEBRIDEMENT LEFT ANKLE, AND APPLY WOUND VAC;  Surgeon: [name], MD;  Location: [facility];  Service: Orthopedics;  Laterality: Bilateral;  . IRRIGATION AND DEBRIDEMENT ABSCESS     Hx: of left arm abscess related to drug use   . TEE WITHOUT CARDIOVERSION N/A 11/28/2017   Procedure: TRANSESOPHAGEAL ECHOCARDIOGRAM (TEE);  Surgeon: Sanda Klein, MD;  [facility] END[facility];  Service: Cardiovascular;  Laterality: N/A;     reports that he has been smoking cigarettes. He has been smoking about 1.00 pack per day. He has never  used smokeless tobacco. He reports previous drug use. Drugs: Cocaine, IV, and Heroin. He reports that he does not drink alcohol.  No Known Allergies  Family History  Problem Relation Age of Onset  . Diabetes Father   . Heart disease Father   . Mental illness Sister   . Cancer Neg Hx      Prior to Admission medications   Medication Sig Start Date End Date Taking? Authorizing Provider  acetaminophen (TYLENOL) 325 MG tablet Take 2 tablets (650 mg total) by mouth every 6 (six) hours as needed for mild pain (or Fever >/= 101). 12/19/16  Yes Debbe Odea, MD  amLODipine (NORVASC) 5  MG tablet Take 1 tablet (5 mg total) by mouth daily. 04/20/19  Yes Gildardo Pounds, NP  atorvastatin (LIPITOR) 20 MG tablet Take 1 tablet (20 mg total) by mouth daily. 04/20/19  Yes Gildardo Pounds, NP  DULoxetine (CYMBALTA) 30 MG capsule TAKE 1 CAPSULE (30 MG TOTAL) BY MOUTH DAILY. 04/03/19 05/08/28 Yes Charlott Rakes, MD  gabapentin (NEURONTIN) 400 MG capsule Take 1 capsule (400 mg total) by mouth 3 (three) times daily. 04/20/19 05/20/19 Yes Gildardo Pounds, NP  Insulin Glargine (LANTUS SOLOSTAR) 100 UNIT/ML Solostar Pen INJECT 40 UNITS INTO THE SKIN 2 (TWO) TIMES DAILY. Patient taking differently: Inject 40 Units into the skin 2 (two) times daily.  04/20/19  Yes Gildardo Pounds, NP  insulin lispro (HUMALOG KWIKPEN) 100 UNIT/ML KwikPen Inject 0.04-0.2 mLs (4-20 Units total) into the skin 3 (three) times daily. Per sliding scale 04/20/19  Yes Gildardo Pounds, NP  levothyroxine (SYNTHROID) 50 MCG tablet TAKE 1 TABLET (50 MCG TOTAL) BY MOUTH DAILY. 04/03/19  Yes Charlott Rakes, MD  lisinopril (ZESTRIL) 10 MG tablet Take 1 tablet (10 mg total) by mouth daily. 10/01/18 05/08/28 Yes Gildardo Pounds, NP  omeprazole (PRILOSEC) 20 MG capsule Take 1 capsule (20 mg total) by mouth daily. 04/20/19 07/19/19 Yes Gildardo Pounds, NP  sertraline (ZOLOFT) 50 MG tablet TAKE 1 TABLET (50 MG TOTAL) BY MOUTH DAILY. 12/19/18  Yes Charlott Rakes, MD  Blood Glucose Monitoring Suppl (TRUE METRIX METER) DEVI 1 kit by Does not apply route as directed. Use as directed 06/07/15   Boykin Nearing, MD  glucose blood test strip Use as instructed 04/20/19   Gildardo Pounds, NP  Insulin Syringe-Needle U-100 (TRUEPLUS INSULIN SYRINGE) 31G X 5/16" 0.5 ML MISC USE AS DIRECTED. 04/20/19   Gildardo Pounds, NP  TRUEPLUS LANCETS 28G MISC Use as directed 01/28/17   Alfonse Spruce, FNP    Physical Exam: Vitals:   05/08/19 2154 05/09/19 0030 05/09/19 0045 05/09/19 0100  BP: 131/77   128/80  Pulse: (!) 124 (!) 120 (!) 119 (!) 113  Resp: (!) '22  17 16 17  '$ Temp: 98.3 F (36.8 C)     TempSrc: Oral     SpO2: 100% 99% 100% 100%    Constitutional: NAD, calm, comfortable Eyes: PERRL, lids and conjunctivae normal ENMT: Mucous membranes are moist. Posterior pharynx clear of any exudate or lesions.Normal dentition.  Neck: normal, supple, no masses, no thyromegaly Respiratory: clear to auscultation bilaterally, no wheezing, no crackles. Normal respiratory effort. No accessory muscle use.  Cardiovascular: Tachycardic Abdomen: no tenderness, no masses palpated. No hepatosplenomegaly. Bowel sounds positive.  Musculoskeletal: no clubbing / cyanosis. No joint deformity upper and lower extremities. Good ROM, no contractures. Normal muscle tone.  Skin: Ulcerations and scars on anterior forearms Neurologic: CN 2-12 grossly intact. Sensation intact, DTR normal.  Strength 5/5 in all 4.  Psychiatric: Normal judgment and insight. Alert and oriented x 3. Normal mood.    Labs on Admission: I have personally reviewed following labs and imaging studies  CBC: Recent Labs  Lab 05/08/19 2210  WBC 20.1*  NEUTROABS 13.7*  HGB 10.1*  HCT 32.0*  MCV 75.3*  PLT 102*   Basic Metabolic Panel: Recent Labs  Lab 05/08/19 2210  NA 132*  K 3.6  CL 95*  CO2 22  GLUCOSE 272*  BUN 27*  CREATININE 2.03*  CALCIUM 8.9   GFR: CrCl cannot be calculated (Unknown ideal weight.). Liver Function Tests: Recent Labs  Lab 05/08/19 2210  AST 21  ALT 17  ALKPHOS 136*  BILITOT 0.4  PROT 7.7  ALBUMIN 2.9*   No results for input(s): LIPASE, AMYLASE in the last 168 hours. No results for input(s): AMMONIA in the last 168 hours. Coagulation Profile: Recent Labs  Lab 05/08/19 2210  INR 1.0   Cardiac Enzymes: No results for input(s): CKTOTAL, CKMB, CKMBINDEX, TROPONINI in the last 168 hours. BNP (last 3 results) No results for input(s): PROBNP in the last 8760 hours. HbA1C: No results for input(s): HGBA1C in the last 72 hours. CBG: No results for  input(s): GLUCAP in the last 168 hours. Lipid Profile: No results for input(s): CHOL, HDL, LDLCALC, TRIG, CHOLHDL, LDLDIRECT in the last 72 hours. Thyroid Function Tests: No results for input(s): TSH, T4TOTAL, FREET4, T3FREE, THYROIDAB in the last 72 hours. Anemia Panel: No results for input(s): VITAMINB12, FOLATE, FERRITIN, TIBC, IRON, RETICCTPCT in the last 72 hours. Urine analysis:    Component Value Date/Time   COLORURINE AMBER (A) 05/08/2019 2350   APPEARANCEUR CLOUDY (A) 05/08/2019 2350   APPEARANCEUR Clear 04/11/2014 0138   LABSPEC 1.030 05/08/2019 2350   LABSPEC 1.026 04/11/2014 0138   PHURINE 5.0 05/08/2019 2350   GLUCOSEU 50 (A) 05/08/2019 2350   GLUCOSEU >=500 04/11/2014 0138   HGBUR NEGATIVE 05/08/2019 2350   BILIRUBINUR NEGATIVE 05/08/2019 2350   BILIRUBINUR negative 03/24/2018 1032   BILIRUBINUR neg 02/18/2017 0946   BILIRUBINUR Negative 04/11/2014 0138   KETONESUR 5 (A) 05/08/2019 2350   PROTEINUR >=300 (A) 05/08/2019 2350   UROBILINOGEN 0.2 03/24/2018 1032   UROBILINOGEN 0.2 12/10/2013 1221   NITRITE NEGATIVE 05/08/2019 2350   LEUKOCYTESUR NEGATIVE 05/08/2019 2350   LEUKOCYTESUR Negative 04/11/2014 0138    Radiological Exams on Admission: DG Chest 2 View  Result Date: 05/08/2019 CLINICAL DATA:  Suspected sepsis. EXAM: CHEST - 2 VIEW COMPARISON:  12/17/2016 FINDINGS: The heart size and mediastinal contours are within normal limits. Both lungs are clear. The visualized skeletal structures are unremarkable. There is stable height loss of the T11 vertebral body. There is stable height loss T10 vertebral body. IMPRESSION: No active cardiopulmonary disease. Electronically Signed   By: Constance Holster M.D.   On: 05/08/2019 22:29    EKG: Independently reviewed.  Assessment/Plan Principal Problem:   Sepsis ([place]) Active Problems:   Drug abuse, IV ([name])   Essential hypertension   CKD (chronic kidney disease) stage 3, GFR 30-59 ml/min   Diabetic polyneuropathy  associated with type 1 diabetes mellitus ([place])   Hepatitis C   Hypothyroidism   Heroin abuse ([place])    1. Sepsis - 1. Suspect bacteremia 2. Endocarditis is a concern 3. 2d echo 4. Repeat BCx pending 5. Will leave the cefepime / vanc alone for the moment 6. Tele monitor 7. Repeat labs in AM 2. Heroin abuse / IVDU - 1. Pt interested in  quitting but wants something to treat withdrawals 2. Will put patient on COWS pathway 3. And start suboxone for withdrawal symptoms 3. DM1 -  1. Cont home Lantus 40u BID 2. 3u novolog mealtimes 3. And mod scale SSI AC 4. CKD stage 3 - 1. Suspect the 2.0 may be his baseline now 5. Hypothyroidism 1. Cont synthroid 2. Check TSH 6. HTN - 1. Hold amlodipine  DVT prophylaxis: Lovenox Code Status: Full Family Communication: No family in room Disposition Plan: Home after admit Consults called: None Admission status: Admit to inpatient  Severity of Illness: The appropriate patient status for this patient is INPATIENT. Inpatient status is judged to be reasonable and necessary in order to provide the required intensity of service to ensure the patient's safety. The patient's presenting symptoms, physical exam findings, and initial radiographic and laboratory data in the context of their chronic comorbidities is felt to place them at high risk for further clinical deterioration. Furthermore, it is not anticipated that the patient will be medically stable for discharge from the hospital within 2 midnights of admission. The following factors support the patient status of inpatient.   IP status due to sepsis from suspected bacteremia.   * I certify that at the point of admission it is my clinical judgment that the patient will require inpatient hospital care spanning beyond 2 midnights from the point of admission due to high intensity of service, high risk for further deterioration and high frequency of surveillance required.*    Chantrice Hagg M.  DO [facility]  How to contact the [facility] Attending or Consulting provider [place] or covering provider during after hours [place], for this patient?  1. Check the care team in [facility] and look for a) attending/consulting [facility] provider listed and b) the [facility] team listed 2. Log into [email or web address]  Amion Physician Scheduling and messaging for groups and whole hospitals  On call and physician scheduling software for group practices, residents, hospitalists and other medical providers for call, clinic, rotation and shift schedules. OnCall [employer] is a hospital-wide system for scheduling doctors and paging doctors on call. EasyPlot is for scientific plotting and data analysis.  [email or web address]  and use [place]'s universal password to access. If you do not have the password, please contact the hospital operator.  3. Locate the [facility] provider you are looking for under [facility] and page to a number that you can be directly reached. 4. If you still have difficulty reaching the provider, please page the [facility] (Director on Call) for the Hospitalists listed on amion for assistance.  05/09/2019, 1:13 AM

## 2019-05-09 NOTE — Progress Notes (Signed)
PHARMACY - PHYSICIAN COMMUNICATION CRITICAL VALUE ALERT - BLOOD CULTURE IDENTIFICATION (BCID)  Luke [name] is an 39 y.o. male who presented to [facility] on 05/08/2019 with a chief complaint of bacteremia.   Assessment:  1 of 4 blood cultures now growing GPC in chains.  No BCID available.   Name of physician (or Provider) ContactedNeysa Bonito, MD  Current antibiotics:  Patient receiving vancomycin and cefepime.  Changes to prescribed antibiotics recommended: none  Results for orders placed or performed during the hospital encounter of 11/21/17  Blood Culture ID Panel (Reflexed) (Collected: 11/21/2017  1:30 PM)  Result Value Ref Range   Enterococcus species NOT DETECTED NOT DETECTED   Listeria monocytogenes NOT DETECTED NOT DETECTED   Staphylococcus species NOT DETECTED NOT DETECTED   Staphylococcus aureus (BCID) NOT DETECTED NOT DETECTED   Streptococcus species DETECTED (A) NOT DETECTED   Streptococcus agalactiae NOT DETECTED NOT DETECTED   Streptococcus pneumoniae NOT DETECTED NOT DETECTED   Streptococcus pyogenes NOT DETECTED NOT DETECTED   Acinetobacter baumannii NOT DETECTED NOT DETECTED   Enterobacteriaceae species NOT DETECTED NOT DETECTED   Enterobacter cloacae complex NOT DETECTED NOT DETECTED   Escherichia coli NOT DETECTED NOT DETECTED   Klebsiella oxytoca NOT DETECTED NOT DETECTED   Klebsiella pneumoniae NOT DETECTED NOT DETECTED   Proteus species NOT DETECTED NOT DETECTED   Serratia marcescens NOT DETECTED NOT DETECTED   Haemophilus influenzae NOT DETECTED NOT DETECTED   Neisseria meningitidis NOT DETECTED NOT DETECTED   Pseudomonas aeruginosa NOT DETECTED NOT DETECTED   Candida albicans NOT DETECTED NOT DETECTED   Candida glabrata NOT DETECTED NOT DETECTED   Candida krusei NOT DETECTED NOT DETECTED   Candida parapsilosis NOT DETECTED NOT DETECTED   Candida tropicalis NOT DETECTED NOT DETECTED     Marguerite Olea, [place] Clinical Pharmacist Phone [phone]  05/09/2019 6:52 PM

## 2019-05-09 NOTE — Progress Notes (Signed)
  Echocardiogram 2D Echocardiogram has been performed.  Luke [name] 05/09/2019, 5:33 PM

## 2019-05-09 NOTE — ED Provider Notes (Signed)
[facility] EMERGENCY DEPARTMENT Provider Note   CSN: [id number] Arrival date & time: 05/08/19  2142     History Chief Complaint  Patient presents with  . Wound Infection    Luke [name] is a 39 y.o. male.  Patient with PMH of IV heroine abuse, DM, HTN, seizures, and recent positive blood cultures presents to the ED after being told by his doctor to come for positive blood cultures.  The blood cultures were actually drawn on 12/31 during his admission and grew strep viridans, but the patient left AMA.  He was seemingly lost to follow-up until he initiated a telehealth visit with [facility] for recurrent skin infections on 2/1.  Then on 2/14 patient was notified that he had positive blood cultures from 12/31 and was told to go to the ER.  This is why he is here tonight.  He states that he is "using this so that he can stop using."  He states that he has also had worsening chest pain and shortness of breath which have been ongoing for the past couple of months.  He denies fever or cough.  The history is provided by the patient. No language interpreter was used.       Past Medical History:  Diagnosis Date  . Anemia   . Anxiety  Dx 2008  . Chronic kidney disease   . Diabetes mellitus without complication ([place]) Dx 1324  . DKA (diabetic ketoacidoses) ([place]) 07/21/2017  . GERD (gastroesophageal reflux disease) Dx 2008  . Headache(784.0)   . Hypertension   . Pneumonia   . Seizures ([place]) 2011    x 2 in lifetime. on Dilantin for a while.   . Substance abuse ([place]) 2013    heroin use, multiple relapses    Patient Active Problem List   Diagnosis Date Noted  . Right forearm cellulitis   . Hypothyroidism   . Subacute osteomyelitis, left ankle and foot ([place])   . Cutaneous abscess of left foot 09/04/2018  . Diabetic ulcer of left foot ([place]) 08/28/2018  . Acute hematogenous osteomyelitis, left ankle and foot ([place])   . Streptococcal bacteremia  11/25/2017  . Hepatitis C 11/25/2017  . Actinomyces infection   . Acute renal failure superimposed on stage 2 chronic kidney disease ([place]) 07/21/2017  . GERD (gastroesophageal reflux disease) 07/21/2017  . Diabetic polyneuropathy associated with type 1 diabetes mellitus ([place])   . Prostatitis, acute   . MRSA infection   . Noncompliant bladder   . Urinary retention   . Prostate abscess   . ARF (acute renal failure) ([place]) 12/16/2016  . Anemia 12/16/2016  . Cocaine abuse ([place]) 08/16/2015  . Uncontrolled insulin dependent type 1 diabetes mellitus ([place]) 08/16/2015  . AKI (acute kidney injury) ([place]) 12/03/2014  . Elevated liver enzymes 07/26/2014  . Dental caries 07/23/2014  . Tinea pedis 07/23/2014  . DKA (diabetic ketoacidoses) ([place]) 11/23/2013  . Abscess 10/14/2012  . Chronic kidney disease 10/14/2012  . Drug abuse, IV ([place]) 10/11/2012  . Tobacco abuse 10/11/2012  . Essential hypertension 10/11/2012    Past Surgical History:  Procedure Laterality Date  . AMPUTATION Left 09/05/2018   Procedure: LEFT GREAT TOE AMPUTATION;  Surgeon: [name], MD;  Location: [place];  Service: Orthopedics;  Laterality: Left;  . I & D EXTREMITY Left 10/11/2012   Procedure: IRRIGATION AND DEBRIDEMENT ABSCESS FOREARM;  Surgeon: Linna Hoff, MD;  Location: [place];  Service: Orthopedics;  Laterality: Left;  . I &  D EXTREMITY Left 10/12/2012   Procedure: IRRIGATION AND DEBRIDEMENT FOREARM;  Surgeon: Linna Hoff, MD;  Location: [place];  Service: Orthopedics;  Laterality: Left;  . I & D EXTREMITY Left 10/14/2012   Procedure: incision and drainage left forearm;  Surgeon: Linna Hoff, MD;  Location: [place];  Service: Orthopedics;  Laterality: Left;  . I & D EXTREMITY Left 10/16/2012   Procedure: IRRIGATION AND DEBRIDEMENT LEFT FOREARM;  Surgeon: Linna Hoff, MD;  Location: [place];  Service: Orthopedics;  Laterality: Left;  . I & D EXTREMITY Left 10/20/2012   Procedure: INCISION AND DRAINAGE AND DEBRIDEMENT  LEFT  FOREARM;  Surgeon: Linna Hoff, MD;  Location: [place];  Service: Orthopedics;  Laterality: Left;  . INCISION AND DRAINAGE OF WOUND Bilateral 11/29/2017   Procedure: DEBRIDEMENT BILATERAL FEET, DEBRIDEMENT LEFT ANKLE, AND APPLY WOUND VAC;  Surgeon: [name], MD;  Location: [place];  Service: Orthopedics;  Laterality: Bilateral;  . IRRIGATION AND DEBRIDEMENT ABSCESS     Hx: of left arm abscess related to drug use   . TEE WITHOUT CARDIOVERSION N/A 11/28/2017   Procedure: TRANSESOPHAGEAL ECHOCARDIOGRAM (TEE);  Surgeon: Sanda Klein, MD;  Location: [facility] ENDOSCOPY;  Service: Cardiovascular;  Laterality: N/A;       Family History  Problem Relation Age of Onset  . Diabetes Father   . Heart disease Father   . Mental illness Sister   . Cancer Neg Hx     Social History   Tobacco Use  . Smoking status: Current Every Day Smoker    Packs/day: 1.00    Types: Cigarettes  . Smokeless tobacco: Never Used  Substance Use Topics  . Alcohol use: No    Alcohol/week: 0.0 standard drinks    Comment: occasional  . Drug use: Not Currently    Types: Cocaine, IV, Heroin    Comment: IVDU since Dad died in Jun 02, 2006 with 15 month period of sobriety 2018-2019, relapse Aug 2019.    Home Medications Prior to Admission medications   Medication Sig Start Date End Date Taking? Authorizing Provider  acetaminophen (TYLENOL) 325 MG tablet Take 2 tablets (650 mg total) by mouth every 6 (six) hours as needed for mild pain (or Fever >/= 101). 12/19/16   Debbe Odea, MD  amLODipine (NORVASC) 5 MG tablet Take 1 tablet (5 mg total) by mouth daily. 04/20/19   Gildardo Pounds, NP  atorvastatin (LIPITOR) 20 MG tablet Take 1 tablet (20 mg total) by mouth daily. 04/20/19   Gildardo Pounds, NP  Blood Glucose Monitoring Suppl (TRUE METRIX METER) DEVI 1 kit by Does not apply route as directed. Use as directed 06/07/15   Funches, Adriana Mccallum, MD  DULoxetine (CYMBALTA) 30 MG capsule TAKE 1 CAPSULE (30 MG TOTAL) BY MOUTH DAILY.  04/03/19 05/03/19  Charlott Rakes, MD  gabapentin (NEURONTIN) 400 MG capsule Take 1 capsule (400 mg total) by mouth 3 (three) times daily. 04/20/19 05/20/19  Gildardo Pounds, NP  glucose blood test strip Use as instructed 04/20/19   Gildardo Pounds, NP  Insulin Glargine (LANTUS SOLOSTAR) 100 UNIT/ML Solostar Pen INJECT 40 UNITS INTO THE SKIN 2 (TWO) TIMES DAILY. 04/20/19   Gildardo Pounds, NP  insulin lispro (HUMALOG KWIKPEN) 100 UNIT/ML KwikPen Inject 0.04-0.2 mLs (4-20 Units total) into the skin 3 (three) times daily. Per sliding scale 04/20/19   Gildardo Pounds, NP  Insulin Syringe-Needle U-100 (TRUEPLUS INSULIN SYRINGE) 31G X 5/16" 0.5 ML MISC USE AS DIRECTED. 04/20/19   Raul Del,  [name], NP  levothyroxine (SYNTHROID) 50 MCG tablet TAKE 1 TABLET (50 MCG TOTAL) BY MOUTH DAILY. 04/03/19   Charlott Rakes, MD  lisinopril (ZESTRIL) 10 MG tablet Take 1 tablet (10 mg total) by mouth daily. 10/01/18 03/19/19  Gildardo Pounds, NP  nitroGLYCERIN (NITRODUR - DOSED IN MG/24 HR) 0.2 mg/hr patch Place 1 patch (0.2 mg total) onto the skin daily. 09/18/18   [name], MD  omeprazole (PRILOSEC) 20 MG capsule Take 1 capsule (20 mg total) by mouth daily. 04/20/19 07/19/19  Gildardo Pounds, NP  oxyCODONE-acetaminophen (PERCOCET) 5-325 MG tablet Take 1 tablet by mouth every 4 (four) hours as needed for moderate pain or severe pain. Patient not taking: Reported on 03/19/2019 09/12/18 09/12/19  Rayburn, Neta Mends, PA-C  oxyCODONE-acetaminophen (PERCOCET/ROXICET) 5-325 MG tablet Take 1 tablet by mouth every 4 (four) hours as needed for severe pain. Patient not taking: Reported on 03/19/2019 09/18/18   [name], MD  pentoxifylline (TRENTAL) 400 MG CR tablet TAKE 1 TABLET (400 MG TOTAL) BY MOUTH 3 (THREE) TIMES DAILY WITH MEALS. Patient not taking: Reported on 04/20/2019 11/11/18   [name], MD  sertraline (ZOLOFT) 50 MG tablet TAKE 1 TABLET (50 MG TOTAL) BY MOUTH DAILY. 12/19/18   Charlott Rakes, MD  TRUEPLUS LANCETS  28G MISC Use as directed 01/28/17   Alfonse Spruce, FNP    Allergies    Patient has no known allergies.  Review of Systems   Review of Systems  All other systems reviewed and are negative.   Physical Exam Updated Vital Signs BP 131/77 (BP Location: Left Arm)   Pulse (!) 124   Temp 98.3 F (36.8 C) (Oral)   Resp (!) 22   SpO2 100%   Physical Exam Vitals and nursing note reviewed.  Constitutional:      Appearance: He is well-developed.  HENT:     Head: Normocephalic and atraumatic.  Eyes:     Conjunctiva/sclera: Conjunctivae normal.  Cardiovascular:     Rate and Rhythm: Regular rhythm. Tachycardia present.     Comments: Tachycardic Pulmonary:     Effort: Pulmonary effort is normal. No respiratory distress.     Breath sounds: Normal breath sounds.  Abdominal:     Palpations: Abdomen is soft.     Tenderness: There is no abdominal tenderness.  Musculoskeletal:        General: Normal range of motion.     Cervical back: Neck supple.  Skin:    General: Skin is warm and dry.     Comments: Numerous ulcerations and scars on anterior forearms  Neurological:     Mental Status: He is alert and oriented to person, place, and time.  Psychiatric:        Mood and Affect: Mood normal.        Behavior: Behavior normal.     ED Results / Procedures / Treatments   Labs (all labs ordered are listed, but only abnormal results are displayed) Labs Reviewed  COMPREHENSIVE METABOLIC PANEL - Abnormal; Notable for the following components:      Result Value   Sodium 132 (*)    Chloride 95 (*)    Glucose, Bld 272 (*)    BUN 27 (*)    Creatinine, Ser 2.03 (*)    Albumin 2.9 (*)    Alkaline Phosphatase 136 (*)    GFR calc non Af Amer 40 (*)    GFR calc Af Amer 46 (*)    All other components  within normal limits  LACTIC ACID, PLASMA - Abnormal; Notable for the following components:   Lactic Acid, Venous 2.2 (*)    All other components within normal limits  CBC WITH  DIFFERENTIAL/PLATELET - Abnormal; Notable for the following components:   WBC 20.1 (*)    Hemoglobin 10.1 (*)    HCT 32.0 (*)    MCV 75.3 (*)    MCH 23.8 (*)    Platelets 592 (*)    Neutro Abs 13.7 (*)    Lymphs Abs 4.8 (*)    Monocytes Absolute 1.2 (*)    Abs Immature Granulocytes 0.09 (*)    All other components within normal limits  CULTURE, BLOOD (ROUTINE X 2)  CULTURE, BLOOD (ROUTINE X 2)  URINE CULTURE  SARS CORONAVIRUS 2 (TAT 6-24 HRS)  PROTIME-INR  LACTIC ACID, PLASMA  URINALYSIS, ROUTINE W REFLEX MICROSCOPIC  APTT    EKG None  Radiology DG Chest 2 View  Result Date: 05/08/2019 CLINICAL DATA:  Suspected sepsis. EXAM: CHEST - 2 VIEW COMPARISON:  12/17/2016 FINDINGS: The heart size and mediastinal contours are within normal limits. Both lungs are clear. The visualized skeletal structures are unremarkable. There is stable height loss of the T11 vertebral body. There is stable height loss T10 vertebral body. IMPRESSION: No active cardiopulmonary disease. Electronically Signed   By: Constance Holster M.D.   On: 05/08/2019 22:29    Procedures Procedures (including critical care time)  Medications Ordered in ED Medications  ceFEPIme (MAXIPIME) 2 g in sodium chloride 0.9 % 100 mL IVPB (has no administration in time range)  metroNIDAZOLE (FLAGYL) IVPB 500 mg (has no administration in time range)  vancomycin (VANCOCIN) IVPB 1000 mg/200 mL premix (has no administration in time range)  lactated ringers bolus 1,000 mL (has no administration in time range)    And  lactated ringers bolus 1,000 mL (has no administration in time range)    And  lactated ringers bolus 500 mL (has no administration in time range)    ED Course  I have reviewed the triage vital signs and the nursing notes.  Pertinent labs & imaging results that were available during my care of the patient were reviewed by me and considered in my medical decision making (see chart for details).    MDM  Rules/Calculators/A&P                      Patient with history of IV drug use.  He using heroin.  Was admitted on 12/31 and had positive blood cultures, but left AGAINST MEDICAL ADVICE.  He was seemingly lost to follow-up, but then initiated care with community health and wellness on 2/1.  He was then notified by community health and wellness on 2/14 that his blood cultures from 2 months ago were positive.  Patient states that he continues to feel rundown, short of breath, and has intermittent episodes of chest pain.  He denies measuring a fever.  He has still been using, but desires to quit.  He states that he finally decided to come in and be treated for his positive blood cultures and also hopes that he can be treated for his IV drug use as well.  He is noted to be afebrile, but is tachycardic to 124, mildly tachypneic at 22, has leukocytosis to 20.1, lactate is 2.2.  Creatinine is near baseline at 2.03.  He has many ulcerations and scars on bilateral forearms from his IV drug use.  There is not appear to  be any current abscess.  Likely at least need observation while waiting for repeat blood cultures.  Will start fluids and antibiotics.  Appreciate Dr. Alcario Drought for admitting.  Final Clinical Impression(s) / ED Diagnoses Final diagnoses:  Positive blood cultures  Sepsis due to other Streptococcus species, unspecified whether acute organ dysfunction present [facility])    Rx / DC Orders ED Discharge Orders    None       Montine Circle, PA-C 05/09/19 0052    Ripley Fraise, MD 05/09/19 [phone]

## 2019-05-09 NOTE — Progress Notes (Signed)
Patient has been tachycardic on admission. No acute change. Marland Kitchen

## 2019-05-09 NOTE — Progress Notes (Signed)
Responded to Spiritual Consult to assess Luke [name] spiritual needs.  Entered the room which was dark, tv on, sound low, Luke [name] reclining on his side.  Bent down and introduced myself, offering to stay and visit if he would like to talk with me.  He declined needing a visit at this time.   Thanked me for coming.    De Burrs Chaplain Resident

## 2019-05-09 NOTE — Progress Notes (Signed)
Luke [name] is a 39 y.o. male with a history of polysubstance use, IVDU with heroin, DM1 and DKA, HTN, prior osteomyelitis of the ankle s/p left great toe amputation who was admitted early this morning by Dr. Alcario Drought for wound infection. Had recent positive strep viridans blood cultures on 12/31 during recent admission but patient left AMA. Lost to follow up until he initiated a telehealth visit for recurrent skin infections on 2/1, then on 2/14 was notified that he had positive blood cultures from 12/31 and was told to go to the ER. Began having chest pain and shortness of breath prompting his admission on 2/20. Has since been started on empiric vancomycin/cefepime.   Currently, denies any fever, chills, night sweats. Admits to improved chest pain. No complaints currently. Overnight patient had asymptomatic hypoglycemia which resolved with PO intake.    PE: General: Awake and alert, no acute distress  Heart: S1 and S2 auscultated, no murmurs  Lungs: Clear to auscultation bilaterally, no wheeze  MSK: no osler nodes or janeway lesions noted on feet or hands   A/P  1. Sepsis from suspected bacteremia probably from IVDU, improving a. Tachycardic, lactic acidosis with leukocytosis on admission and recent positive blood cultures b. Awaiting repeat blood cultures c. Continue empiric Vancomycin/Cefepime d. Echo 2. RUE wounds present on admission, likely secondary to IVDU a. Wound care 3. DM1 with hypoglycemia a. HA1c in December 10.0 b. Decrease Lantus from 40 u BID to 35 BID while inpatient  c. Continue mealtime insulin and moderate sliding scale 4. CKD 3 a. Baseline in 2019 1.3-1.4, currently 2.03->1.79. Unknown if he is at new baseline or with AKI at this point b. Hold Lisinopril c. Continue to monitor 5. Hypothyroidism stable 6. HTN a. Restart amlodipine b. Holding Lisinopril 7. IVDU a. Wishes to quit b. Started on Suboxone c. Follow up with SW 8. Microcytic anemia a. Check iron  studies    Harold Hedge, DO [facility] Pager [phone]

## 2019-05-09 NOTE — Progress Notes (Signed)
Pharmacy Antibiotic Note  Luke [name] is a 39 y.o. male admitted on 05/08/2019 with sepsis.  Pharmacy has been consulted for vancomycin and cefepime dosing.  Plan: Cefepime 2gm IV q8 hours Vancomycin 1500 mg IV x 1 then 1250 mg IV q24 hours F/u renal function, cultures and clinical course     Temp (24hrs), Avg:98.3 F (36.8 C), Min:98.3 F (36.8 C), Max:98.3 F (36.8 C)  Recent Labs  Lab 05/08/19 2210 05/08/19 2211  WBC 20.1*  --   CREATININE 2.03*  --   LATICACIDVEN  --  2.2*    CrCl cannot be calculated (Unknown ideal weight.).    No Known Allergies    Thank you for allowing pharmacy to be a part of this patient's care.  [name] 05/09/2019 12:29 AM

## 2019-05-10 ENCOUNTER — Encounter (HOSPITAL_COMMUNITY): Payer: Commercial Managed Care - PPO

## 2019-05-10 ENCOUNTER — Inpatient Hospital Stay: Payer: Self-pay

## 2019-05-10 DIAGNOSIS — L988 Other specified disorders of the skin and subcutaneous tissue: Secondary | ICD-10-CM

## 2019-05-10 DIAGNOSIS — Z89412 Acquired absence of left great toe: Secondary | ICD-10-CM

## 2019-05-10 DIAGNOSIS — Z794 Long term (current) use of insulin: Secondary | ICD-10-CM

## 2019-05-10 DIAGNOSIS — Z8631 Personal history of diabetic foot ulcer: Secondary | ICD-10-CM

## 2019-05-10 DIAGNOSIS — F17210 Nicotine dependence, cigarettes, uncomplicated: Secondary | ICD-10-CM

## 2019-05-10 DIAGNOSIS — E1152 Type 2 diabetes mellitus with diabetic peripheral angiopathy with gangrene: Secondary | ICD-10-CM

## 2019-05-10 DIAGNOSIS — Z872 Personal history of diseases of the skin and subcutaneous tissue: Secondary | ICD-10-CM

## 2019-05-10 DIAGNOSIS — F1190 Opioid use, unspecified, uncomplicated: Secondary | ICD-10-CM

## 2019-05-10 DIAGNOSIS — E1140 Type 2 diabetes mellitus with diabetic neuropathy, unspecified: Secondary | ICD-10-CM

## 2019-05-10 DIAGNOSIS — Z8619 Personal history of other infectious and parasitic diseases: Secondary | ICD-10-CM

## 2019-05-10 DIAGNOSIS — E11628 Type 2 diabetes mellitus with other skin complications: Secondary | ICD-10-CM

## 2019-05-10 DIAGNOSIS — B955 Unspecified streptococcus as the cause of diseases classified elsewhere: Secondary | ICD-10-CM

## 2019-05-10 DIAGNOSIS — I330 Acute and subacute infective endocarditis: Secondary | ICD-10-CM

## 2019-05-10 DIAGNOSIS — Z8739 Personal history of other diseases of the musculoskeletal system and connective tissue: Secondary | ICD-10-CM

## 2019-05-10 DIAGNOSIS — M79604 Pain in right leg: Secondary | ICD-10-CM

## 2019-05-10 DIAGNOSIS — I96 Gangrene, not elsewhere classified: Secondary | ICD-10-CM

## 2019-05-10 LAB — BLOOD CULTURE ID PANEL (REFLEXED)

## 2019-05-10 LAB — CBC
HCT: 33.9 % — ABNORMAL LOW (ref 39.0–52.0)
Hemoglobin: 10.7 g/dL — ABNORMAL LOW (ref 13.0–17.0)
MCH: 23.7 pg — ABNORMAL LOW (ref 26.0–34.0)
MCHC: 31.6 g/dL (ref 30.0–36.0)
MCV: 75 fL — ABNORMAL LOW (ref 80.0–100.0)
Platelets: 520 10*3/uL — ABNORMAL HIGH (ref 150–400)
RBC: 4.52 MIL/uL (ref 4.22–5.81)
RDW: 13.9 % (ref 11.5–15.5)
WBC: 16.4 10*3/uL — ABNORMAL HIGH (ref 4.0–10.5)
nRBC: 0 % (ref 0.0–0.2)

## 2019-05-10 LAB — URINE CULTURE: Culture: NO GROWTH

## 2019-05-10 LAB — ECHOCARDIOGRAM COMPLETE
Height: 69 in
Weight: 2821.89 oz

## 2019-05-10 LAB — BASIC METABOLIC PANEL
Anion gap: 10 (ref 5–15)
BUN: 12 mg/dL (ref 6–20)
CO2: 22 mmol/L (ref 22–32)
Calcium: 8.9 mg/dL (ref 8.9–10.3)
Chloride: 102 mmol/L (ref 98–111)
Creatinine, Ser: 1.15 mg/dL (ref 0.61–1.24)
GFR calc Af Amer: >60 mL/min (ref >60)
GFR calc non Af Amer: >60 mL/min (ref >60)
Glucose, Bld: 49 mg/dL — ABNORMAL LOW (ref 70–99)
Potassium: 4 mmol/L (ref 3.5–5.1)
Sodium: 134 mmol/L — ABNORMAL LOW (ref 135–145)

## 2019-05-10 LAB — GLUCOSE, CAPILLARY
Glucose-Capillary: 93 mg/dL (ref 70–99)
Glucose-Capillary: 169 mg/dL — ABNORMAL HIGH (ref 70–99)
Glucose-Capillary: 341 mg/dL — ABNORMAL HIGH (ref 70–99)
Glucose-Capillary: 125 mg/dL — ABNORMAL HIGH (ref 70–99)

## 2019-05-10 MED ORDER — MELATONIN 3 MG PO TABS
6.0000 mg | ORAL_TABLET | Freq: Every day | ORAL | Status: DC
  Filled 2019-05-10: qty 2

## 2019-05-10 MED ORDER — SODIUM CHLORIDE 0.9 % IV SOLN: INTRAVENOUS | Status: DC

## 2019-05-10 MED ORDER — INSULIN GLARGINE 100 UNIT/ML ~~LOC~~ SOLN
30.0000 [IU] | Freq: Two times a day (BID) | SUBCUTANEOUS | Status: DC
  Administered 2019-05-10 – 2019-05-11 (×2): 30 [IU] via SUBCUTANEOUS
  Filled 2019-05-10 (×4): qty 0.3

## 2019-05-10 MED ORDER — ORITAVANCIN DIPHOSPHATE 400 MG IV SOLR
1200.0000 mg | Freq: Once | INTRAVENOUS | Status: AC
  Administered 2019-05-10: 1200 mg via INTRAVENOUS
  Filled 2019-05-10: qty 120

## 2019-05-10 MED ORDER — NON FORMULARY: 6.0000 mg | Freq: Every evening | Status: DC | PRN

## 2019-05-10 MED ORDER — SODIUM CHLORIDE 0.9 % IV SOLN
2.0000 g | Freq: Three times a day (TID) | INTRAVENOUS | Status: DC
  Administered 2019-05-10: 2 g via INTRAVENOUS
  Filled 2019-05-10 (×3): qty 2

## 2019-05-10 MED ORDER — MELATONIN 3 MG PO TABS
6.0000 mg | ORAL_TABLET | Freq: Every evening | ORAL | Status: DC | PRN
  Administered 2019-05-10 – 2019-05-16 (×3): 6 mg via ORAL
  Filled 2019-05-10 (×5): qty 2

## 2019-05-10 MED ORDER — SODIUM CHLORIDE 0.9 % IV SOLN
2.0000 g | INTRAVENOUS | Status: DC
  Administered 2019-05-10 – 2019-05-11 (×2): 2 g via INTRAVENOUS
  Filled 2019-05-10 (×2): qty 2

## 2019-05-10 NOTE — Progress Notes (Signed)
PROGRESS NOTE    Luke [name]    Code Status: Full Code  [id number] DOB: 24-May-1980 DOA: 05/08/2019 LOS: 1 days  PCP: Gildardo Pounds, NP CC:  Chief Complaint  Patient presents with  . Wound Infection       Hospital Summary  Luke [name] is a 39 y.o. 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 Had recent positive strep viridans blood cultures on 12/31 during recent admission but patient left AMA. Lost to follow up until he initiated a telehealth visit for recurrent skin infections on 2/1, then on 2/14 was notified that he had positive blood cultures from 12/31 and was told to go to the ER. Began having chest pain and shortness of breath prompting his admission on 2/20. started on empiric vancomycin/cefepime.     2/21 TTE showed AV/MV vegetations, infectious disease and cardiology consulted.  A & P   Principal Problem:   Sepsis ([place]) Active Problems:   Drug abuse, IV ([place])   Essential hypertension   CKD (chronic kidney disease) stage 3, GFR 30-59 ml/min   Diabetic polyneuropathy associated with type 1 diabetes mellitus ([place])   Hepatitis C   Hypothyroidism   Heroin abuse ([place])   1. Sepsis secondary to Strep bacteremia and Aortic/Mitral Valve endocarditis, in setting of IVDU a. Hx of strep viridans bacteremia, untreated, 03/19/19 b. Tachycardic, lactic acidosis with leukocytosis on admission, now stable, WBC trending down c. 2/19 Blood cultures positive for strep species d. Continue empiric Vancomycin/Cefepime pending ID recommendations e. Echo today with Aortic and Mitral vegetations f. Cardiology consulted for TEE, will make NPO after midnight for possible procedure tomorrow 2. RUE wounds present on admission, likely secondary to IVDU a. Leading to difficulty of IV access b. Wound care c. Consider midline/central line if he continues to have  poor access 3. DM1 with episode of hypoglycemia a. No recurrent hypoglycemia b. HA1c in December 10.0 c. Will decrease Lantus to 30 units overnight for NPO status d. Hold mealtime insulin while NPO e. Diabetic coordinator 4. CKD 3 a. Baseline in 2019 1.3-1.4, currently 2.03->1.79. Unknown if he is at new baseline or with AKI at this point b. Hold Lisinopril c. Continue to monitor 5. Hypothyroidism stable 6. HTN a. Continue amlodipine b. Holding Lisinopril 7. Right lower extremity calf tenderness/swelling a. RLE [place] 8. IVDU a. Wishes to quit b. Started on Suboxone c. Follow up with SW 9. Microcytic anemia a. Check iron studies  DVT prophylaxis: Lovenox Family Communication: Wife works until 7 PM, patient updated wife himself Disposition Plan:   Patient came from:  home                                                                                          Anticipated d/c place: home  Barriers to d/c: medical stability, IV antibiotics. Will likely need to main inpatient for IV antibiotic duration due to heroin abuse  Pressure injury documentation    None  Consultants  ID Cardiology  Procedures  None  Antibiotics  Anti-infectives (From admission, onward)   Start     Dose/Rate Route Frequency Ordered Stop   05/10/19 1300  cefTRIAXone (ROCEPHIN) 2 g in sodium chloride 0.9 % 100 mL IVPB     2 g 200 mL/hr over 30 Minutes Intravenous Every 24 hours 05/10/19 1259     05/10/19 0845  ceFEPIme (MAXIPIME) 2 g in sodium chloride 0.9 % 100 mL IVPB  Status:  Discontinued     2 g 200 mL/hr over 30 Minutes Intravenous Every 8 hours 05/10/19 0836 05/10/19 1259   05/10/19 0000  vancomycin (VANCOREADY) IVPB 1250 mg/250 mL     1,250 mg 166.7 mL/hr over 90 Minutes Intravenous Every 24 hours 05/09/19 0031     05/09/19 2200  ceFEPIme (MAXIPIME) 2 g in sodium chloride 0.9 % 100 mL IVPB  Status:  Discontinued     2 g 200 mL/hr over 30 Minutes Intravenous Every 12 hours 05/09/19 1041  05/10/19 0836   05/09/19 0800  ceFEPIme (MAXIPIME) 2 g in sodium chloride 0.9 % 100 mL IVPB  Status:  Discontinued     2 g 200 mL/hr over 30 Minutes Intravenous Every 8 hours 05/09/19 0012 05/09/19 1041   05/09/19 0015  ceFEPIme (MAXIPIME) 2 g in sodium chloride 0.9 % 100 mL IVPB     2 g 200 mL/hr over 30 Minutes Intravenous  Once 05/09/19 0004 05/09/19 0130   05/09/19 0015  metroNIDAZOLE (FLAGYL) IVPB 500 mg     500 mg 100 mL/hr over 60 Minutes Intravenous  Once 05/09/19 0004 05/09/19 0150   05/09/19 0015  vancomycin (VANCOCIN) IVPB 1000 mg/200 mL premix  Status:  Discontinued     1,000 mg 200 mL/hr over 60 Minutes Intravenous  Once 05/09/19 0004 05/09/19 0010   05/09/19 0015  vancomycin (VANCOREADY) IVPB 1500 mg/300 mL     1,500 mg 150 mL/hr over 120 Minutes Intravenous  Once 05/09/19 0010 05/09/19 0351        Subjective   Seen and examined at bedside no acute distress and resting comfortably.  No acute events overnight.  Patient complaining of recent onset right lower extremity calf tenderness and swelling without erythema.  Does not know when this started specifically.  No other complaints  Objective   Vitals:   05/09/19 0532 05/09/19 1511 05/09/19 2204 05/10/19 0443  BP:  (!) 144/90 (!) 148/89 127/88  Pulse:  (!) 116 97 95  Resp:  18 18 17   Temp:  98.6 F (37 C) 98.2 F (36.8 C) 98.7 F (37.1 C)  TempSrc:  Oral Oral Oral  SpO2:  100% 97% 98%  Weight:      Height: 5\' 9"  (1.753 m)       Intake/Output Summary (Last 24 hours) at 05/10/2019 1303 Last data filed at 05/10/2019 0830 Gross per 24 hour  Intake 480 ml  Output 2775 ml  Net -2295 ml   Filed Weights   05/09/19 0400  Weight: 80 kg    Examination:  Physical Exam Vitals and nursing note reviewed.  Constitutional:      Appearance: Normal appearance.  HENT:     Head: Normocephalic and atraumatic.  Eyes:     Conjunctiva/sclera: Conjunctivae normal.  Cardiovascular:     Rate and Rhythm: Normal rate and  regular rhythm.     Heart sounds: No murmur.  Pulmonary:     Effort: Pulmonary effort is normal.     Breath sounds: Normal breath sounds.  Abdominal:     General: Abdomen is flat.  Palpations: Abdomen is soft.  Musculoskeletal:     Comments: Right lower extremity calf tenderness to palpation with mild swelling compared to left  Skin:    Comments: Bilateral upper extremity poorly healing wounds  Neurological:     Mental Status: He is alert. Mental status is at baseline.  Psychiatric:        Mood and Affect: Mood normal.        Behavior: Behavior normal.     Data Reviewed: I have personally reviewed following labs and imaging studies  CBC: Recent Labs  Lab 05/08/19 2210 05/09/19 0634 05/10/19 0650  WBC 20.1* 18.2* 16.4*  NEUTROABS 13.7*  --   --   HGB 10.1* 9.3* 10.7*  HCT 32.0* 30.3* 33.9*  MCV 75.3* 76.5* 75.0*  PLT 592* 495* 123456*   Basic Metabolic Panel: Recent Labs  Lab 05/08/19 2210 05/09/19 0136 05/10/19 0650  NA 132* 133* 134*  K 3.6 3.5 4.0  CL 95* 98 102  CO2 22 22 22   GLUCOSE 272* 148* 49*  BUN 27* 26* 12  CREATININE 2.03* 1.79* 1.15  CALCIUM 8.9 8.7* 8.9   GFR: Estimated Creatinine Clearance: 86.2 mL/min (by C-G formula based on SCr of 1.15 mg/dL). Liver Function Tests: Recent Labs  Lab 05/08/19 2210  AST 21  ALT 17  ALKPHOS 136*  BILITOT 0.4  PROT 7.7  ALBUMIN 2.9*   No results for input(s): LIPASE, AMYLASE in the last 168 hours. No results for input(s): AMMONIA in the last 168 hours. Coagulation Profile: Recent Labs  Lab 05/08/19 2210  INR 1.0   Cardiac Enzymes: No results for input(s): CKTOTAL, CKMB, CKMBINDEX, TROPONINI in the last 168 hours. BNP (last 3 results) No results for input(s): PROBNP in the last 8760 hours. HbA1C: No results for input(s): HGBA1C in the last 72 hours. CBG: Recent Labs  Lab 05/09/19 1645 05/09/19 1704 05/09/19 2201 05/10/19 0732 05/10/19 1214  GLUCAP 46* 114* 161* 93 169*   Lipid  Profile: No results for input(s): CHOL, HDL, LDLCALC, TRIG, CHOLHDL, LDLDIRECT in the last 72 hours. Thyroid Function Tests: Recent Labs    05/09/19 0102  TSH 4.047   Anemia Panel: Recent Labs    05/09/19 1243  FERRITIN 150  TIBC 248*  IRON 12*   Sepsis Labs: Recent Labs  Lab 05/08/19 2211 05/09/19 0040  LATICACIDVEN 2.2* 1.6    Recent Results (from the past 240 hour(s))  Culture, blood (Routine x 2)     Status: None (Preliminary result)   Collection Time: 05/08/19 10:00 PM   Specimen: BLOOD RIGHT HAND  Result Value Ref Range Status   Specimen Description BLOOD RIGHT HAND  Final   Special Requests   Final    BOTTLES DRAWN AEROBIC ONLY Blood Culture adequate volume   Culture  Setup Time   Final    AEROBIC BOTTLE ONLY GRAM POSITIVE COCCI Organism ID to follow CRITICAL RESULT CALLED TO, READ BACK BY AND VERIFIED WITH: PHRMD FEAY LORA AT [id number] BY MESSAN HOUEGNIFIO ON 05/10/2019    Culture   Final    GRAM POSITIVE COCCI IDENTIFICATION TO FOLLOW Performed at [facility], [place] [address], [place], [place] [address]    Report Status PENDING  Incomplete  Blood Culture ID Panel (Reflexed)     Status: Abnormal   Collection Time: 05/08/19 10:00 PM  Result Value Ref Range Status   Enterococcus species NOT DETECTED NOT DETECTED Final   Listeria monocytogenes NOT DETECTED NOT DETECTED Final   Staphylococcus species NOT  DETECTED NOT DETECTED Final   Staphylococcus aureus (BCID) NOT DETECTED NOT DETECTED Final   Streptococcus species DETECTED (A) NOT DETECTED Final    Comment: Not Enterococcus species, Streptococcus agalactiae, Streptococcus pyogenes, or Streptococcus pneumoniae. CRITICAL RESULT CALLED TO, READ BACK BY AND VERIFIED WITH: PHRMD FEAY LORA AT [id number] BY [place] ON 05/10/2019    Streptococcus agalactiae NOT DETECTED NOT DETECTED Final   Streptococcus pneumoniae NOT DETECTED NOT DETECTED Final   Streptococcus pyogenes NOT DETECTED NOT DETECTED Final    Acinetobacter baumannii NOT DETECTED NOT DETECTED Final   Enterobacteriaceae species NOT DETECTED NOT DETECTED Final   Enterobacter cloacae complex NOT DETECTED NOT DETECTED Final   Escherichia coli NOT DETECTED NOT DETECTED Final   Klebsiella oxytoca NOT DETECTED NOT DETECTED Final   Klebsiella pneumoniae NOT DETECTED NOT DETECTED Final   Proteus species NOT DETECTED NOT DETECTED Final   Serratia marcescens NOT DETECTED NOT DETECTED Final   Haemophilus influenzae NOT DETECTED NOT DETECTED Final   Neisseria meningitidis NOT DETECTED NOT DETECTED Final   Pseudomonas aeruginosa NOT DETECTED NOT DETECTED Final   Candida albicans NOT DETECTED NOT DETECTED Final   Candida glabrata NOT DETECTED NOT DETECTED Final   Candida krusei NOT DETECTED NOT DETECTED Final   Candida parapsilosis NOT DETECTED NOT DETECTED Final   Candida tropicalis NOT DETECTED NOT DETECTED Final    Comment: Performed at [facility], [place] [address], [place], [place] [address]  Culture, blood (Routine x 2)     Status: None (Preliminary result)   Collection Time: 05/08/19 10:12 PM   Specimen: BLOOD RIGHT ARM  Result Value Ref Range Status   Specimen Description BLOOD RIGHT ARM  Final   Special Requests   Final    BOTTLES DRAWN AEROBIC AND ANAEROBIC Blood Culture adequate volume   Culture  Setup Time   Final    GRAM POSITIVE COCCI IN BOTH AEROBIC AND ANAEROBIC BOTTLES CRITICAL RESULT CALLED TO, READ BACK BY AND VERIFIED WITH: PHARMD JESSICA BARNEY @1838  05/09/19 AKT Performed at [facility], [place] [address] [place], [place] [address]    Culture GRAM POSITIVE COCCI  Final   Report Status PENDING  Incomplete  Urine culture     Status: None   Collection Time: 05/09/19 12:03 AM   Specimen: In/Out Cath Urine  Result Value Ref Range Status   Specimen Description IN/OUT CATH URINE  Final   Special Requests NONE  Final   Culture   Final    NO GROWTH Performed at [facility], [place] [address]  [place], [place] [address]    Report Status 05/10/2019 FINAL  Final  SARS CORONAVIRUS 2 (TAT 6-24 HRS) Nasopharyngeal Nasopharyngeal Swab     Status: None   Collection Time: 05/09/19  1:24 AM   Specimen: Nasopharyngeal Swab  Result Value Ref Range Status   SARS Coronavirus 2 NEGATIVE NEGATIVE Final    Comment: (NOTE) SARS-CoV-2 target nucleic acids are NOT DETECTED. The SARS-CoV-2 RNA is generally detectable in upper and lower respiratory specimens during the acute phase of infection. Negative results do not preclude SARS-CoV-2 infection, do not rule out co-infections with other pathogens, and should not be used as the sole basis for treatment or other patient management decisions. Negative results must be combined with clinical observations, patient history, and epidemiological information. The expected result is Negative. Fact Sheet for Patients: SugarRoll.be Fact Sheet for Healthcare Providers: https://www.woods-mathews.com/ This test is not yet approved or cleared by the [place] FDA and  has been authorized for detection and/or diagnosis of SARS-CoV-2 by FDA under an Emergency Use Authorization (EUA). This EUA will remain  in effect (meaning this test can be used) for the duration of the COVID-19 declaration under Section 56 4(b)(1) of the Act, 21 U.S.C. section 360bbb-3(b)(1), unless the authorization is terminated or revoked sooner. Performed at [facility], [place] [address], [place], [place] [address]          Radiology Studies: DG Chest 2 View  Result Date: 05/08/2019 CLINICAL DATA:  Suspected sepsis. EXAM: CHEST - 2 VIEW COMPARISON:  12/17/2016 FINDINGS: The heart size and mediastinal contours are within normal limits. Both lungs are clear. The visualized skeletal structures are unremarkable. There is stable height loss of the T11 vertebral body. There is stable height loss T10 vertebral body. IMPRESSION: No active  cardiopulmonary disease. Electronically Signed   By: Constance Holster M.D.   On: 05/08/2019 22:29   ECHOCARDIOGRAM COMPLETE  Result Date: 05/10/2019    ECHOCARDIOGRAM REPORT   Patient Name:   ERICKA ANTOSH Date of Exam: 05/09/2019 Medical Rec #:  [id number]    Height:       69.0 in Accession #:    [id number]   Weight:       176.4 lb Date of Birth:  Dec 05, 1980    BSA:          1.958 m Patient Age:    21 years     BP:           144/90 mmHg Patient Gender: M            HR:           116 bpm. Exam Location:  Inpatient Procedure: 2D Echo Indications:    Endocarditis I38  History:        Patient has prior history of Echocardiogram examinations. Risk                 Factors:Diabetes, Hypertension and Current Smoker. Drug abuse.                 CKD. Sepsis. Herion abuse.  Sonographer:    Clayton Lefort RDCS (AE) Referring Phys: [place]  1. Left ventricular ejection fraction, by estimation, is 60 to 65%. The left ventricle has normal function. The left ventricle has no regional wall motion abnormalities. There is moderate concentric left ventricular hypertrophy. Left ventricular diastolic parameters were normal.  2. Right ventricular systolic function is normal. The right ventricular size is normal. There is normal pulmonary artery systolic pressure.  3. Left atrial size was moderately dilated.  4. Moderate vegetation on the mitral valve.  5. Vegetation noted on the anterior mitral valve leaflet. Recommend TEE to better evaluate.. The mitral valve is normal in structure and function. Trivial mitral valve regurgitation. No evidence of mitral stenosis.  6. Abnromal thickening of the right and non-coronary cusps concerning for endocarditis. Recommend TEE to better evaluate.. The aortic valve is abnormal. Aortic valve regurgitation is mild to moderate. No aortic stenosis is present.  7. Aortic valve vegetation is visualized on the right and noncoronary.  8. The inferior vena cava is normal in size with  greater than 50% respiratory variability, suggesting right atrial pressure of 3 mmHg. FINDINGS  Left Ventricle: Left ventricular ejection fraction, by estimation, is 60 to 65%. The left ventricle has normal function. The left ventricle has no regional wall motion abnormalities. The left ventricular internal cavity size was normal in size.  There is  moderate concentric left ventricular hypertrophy. Left ventricular diastolic parameters were normal. Right Ventricle: The right ventricular size is normal. No increase in right ventricular wall thickness. Right ventricular systolic function is normal. There is normal pulmonary artery systolic pressure. The tricuspid regurgitant velocity is 1.47 m/s, and  with an assumed right atrial pressure of 3 mmHg, the estimated right ventricular systolic pressure is 123XX123 mmHg. Left Atrium: Left atrial size was moderately dilated. Right Atrium: Right atrial size was normal in size. Pericardium: There is no evidence of pericardial effusion. Mitral Valve: Vegetation noted on the anterior mitral valve leaflet. Recommend TEE to better evaluate. The mitral valve is normal in structure and function. Normal mobility of the mitral valve leaflets. A moderate vegetation is seen on the anterior mitral leaflet. The MV vegetation measures 18 mm x 14 mm. Trivial mitral valve regurgitation. No evidence of mitral valve stenosis. MV peak gradient, 5.4 mmHg. The mean mitral valve gradient is 3.0 mmHg. Tricuspid Valve: Cannot rule out tricuspid valve vegetation. The tricuspid valve is normal in structure. Tricuspid valve regurgitation is mild . No evidence of tricuspid stenosis. Aortic Valve: Abnromal thickening of the right and non-coronary cusps concerning for endocarditis. Recommend TEE to better evaluate. The aortic valve is abnormal. Aortic valve regurgitation is mild to moderate. Aortic regurgitation PHT measures 229 msec.  No aortic stenosis is present. Aortic valve mean gradient measures 6.0  mmHg. Aortic valve peak gradient measures 13.2 mmHg. Aortic valve area, by VTI measures 2.54 cm. A vegetation is seen on the right and noncoronary. Pulmonic Valve: The pulmonic valve was normal in structure. Pulmonic valve regurgitation is not visualized. No evidence of pulmonic stenosis. Aorta: The aortic root is normal in size and structure. Venous: The inferior vena cava is normal in size with greater than 50% respiratory variability, suggesting right atrial pressure of 3 mmHg. IAS/Shunts: No atrial level shunt detected by color flow Doppler.  LEFT VENTRICLE PLAX 2D LVIDd:         3.90 cm  Diastology LVIDs:         2.55 cm  LV e' lateral:   9.46 cm/s LV PW:         1.40 cm  LV E/e' lateral: 11.7 LV IVS:        1.50 cm  LV e' medial:    10.70 cm/s LVOT diam:     2.00 cm  LV E/e' medial:  10.4 LV SV:         64.72 ml LV SV Index:   33.04 LVOT Area:     3.14 cm  RIGHT VENTRICLE             IVC RV Basal diam:  3.20 cm     IVC diam: 1.50 cm RV S prime:     17.00 cm/s TAPSE (M-mode): 1.3 cm LEFT ATRIUM             Index       RIGHT ATRIUM          Index LA diam:        2.70 cm 1.38 cm/m  RA Area:     9.75 cm LA Vol (A2C):   49.7 ml 25.38 ml/m RA Volume:   15.80 ml 8.07 ml/m LA Vol (A4C):   66.8 ml 34.11 ml/m LA Biplane Vol: 58.3 ml 29.77 ml/m  AORTIC VALVE AV Area (Vmax):    2.45 cm AV Area (Vmean):   2.62 cm AV Area (VTI):     2.54  cm AV Vmax:           182.00 cm/s AV Vmean:          120.000 cm/s AV VTI:            0.255 m AV Peak Grad:      13.2 mmHg AV Mean Grad:      6.0 mmHg LVOT Vmax:         142.00 cm/s LVOT Vmean:        100.000 cm/s LVOT VTI:          0.206 m LVOT/AV VTI ratio: 0.81 AI PHT:            229 msec  AORTA Ao Root diam: 2.50 cm Ao Asc diam:  2.80 cm MITRAL VALVE                TRICUSPID VALVE MV Area (PHT): 5.84 cm     TR Peak grad:   8.6 mmHg MV Peak grad:  5.4 mmHg     TR Vmax:        147.00 cm/s MV Mean grad:  3.0 mmHg MV Vmax:       1.16 m/s     SHUNTS MV Vmean:      80.4 cm/s     Systemic VTI:  0.21 m MV Decel Time: 130 msec     Systemic Diam: 2.00 cm MV E velocity: 111.00 cm/s MV A velocity: 113.00 cm/s MV E/A ratio:  0.98 Skeet Latch MD Electronically signed by Skeet Latch MD Signature Date/Time: 05/10/2019/8:33:46 AM    Final    [place] EKG SITE RITE  Result Date: 05/10/2019 If Site Rite image not attached, placement could not be confirmed due to current cardiac rhythm.       Scheduled Meds: . amLODipine  5 mg Oral Daily  . atorvastatin  20 mg Oral Daily  . buprenorphine-naloxone  1 tablet Sublingual BID  . DULoxetine  30 mg Oral Daily  . enoxaparin (LOVENOX) injection  40 mg Subcutaneous Q24H  . gabapentin  400 mg Oral TID  . insulin aspart  0-15 Units Subcutaneous TID WC  . insulin aspart  3 Units Subcutaneous TID WC  . insulin glargine  35 Units Subcutaneous BID  . levothyroxine  50 mcg Oral Q0600  . pantoprazole  40 mg Oral Daily  . sertraline  50 mg Oral Daily   Continuous Infusions: . cefTRIAXone (ROCEPHIN)  IV    . vancomycin 1,250 mg (05/10/19 0018)     Time spent: 30 minutes with over 50% of the time coordinating the patient's care    Harold Hedge, DO [facility] Pager [phone]  Call night coverage person covering after 7pm

## 2019-05-10 NOTE — Progress Notes (Signed)
Phlebotomy came by this morning to get labs.  She was unable to get samples due to patient's arms having multiple wounds.  MD, please consider getting a PICC line for patient.

## 2019-05-10 NOTE — Consult Note (Signed)
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 [name]   Assessment:   1.  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 History of prior streptococcal and actinomyces bacteremia in 2019 due to calcaneal osteomyelitis status post surgery by Dr. Sharol Given (though he did not have curative amputation) treated in the hospital with penicillin then sent home with Augmentin 3. History of foot abscess and osteomyelitis status post left ray amputation by Dr. Sharol Given in June 2020 4. Recent admission in December 31 when he was found to have viridans streptococcal bacteremia but left AGAINST MEDICAL ADVICE 5. IV heroine use 6. Multiple necrotic skin lesions 7. IDDM poorly controlled with neuropathy 8. Poor insight  Plan:  1. Give him a dose of IV oritavancin today 2. Switch his daily IV to Ceftriaxone 2 grams daily 3. Agree with TEE 4. Repeat blood cultures when able 5. He will not agree to stay for full course and I doubt he stays more than a few days at maximum 6. 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 amLODipine  5 mg Oral Daily  . atorvastatin  20 mg Oral Daily  . buprenorphine-naloxone  1 tablet Sublingual BID  . DULoxetine  30 mg Oral Daily  . enoxaparin (LOVENOX) injection  40 mg Subcutaneous Q24H  . gabapentin  400 mg Oral TID  . insulin aspart  0-15 Units Subcutaneous TID WC  . insulin aspart  3 Units Subcutaneous TID WC  . insulin glargine   35 Units Subcutaneous BID  . levothyroxine  50 mcg Oral Q0600  . pantoprazole  40 mg Oral Daily  . sertraline  50 mg Oral Daily   Continuous Infusions: . cefTRIAXone (ROCEPHIN)  IV    . vancomycin 1,250 mg (05/10/19 0018)   PRN Meds:.acetaminophen **OR** acetaminophen, ondansetron **OR** ondansetron (ZOFRAN) IV  HPI: Luke [name] is a 39 y.o. male with longstanding history of intravenous drug use, insulin-dependent diabetes mellitus that is been poorly controlled with neuropathy.  We previously saw him in 2019 when he was admitted with streptococcal and actinomyces bacteremia.  This was due to a calcaneal osteomyelitis and foot abscess.  At the time we had wrapped recommended below the knee amputation for curative surgery but he did not agree with this but did have debridement to bilateral feet performed by Dr. Sharol Given on November 29, 2017.  He was treated in the inpatient world with IV penicillin and then discharged with Augmentin.  He then developed a diabetic foot infection with MRSA and underwent great toe amputation by Dr. Sharol Given on September 05, 2018.  Of note transesophageal echocardiogram during that stay failed to show evidence of endocarditis  This December on New Year's Eve he was admitted the hospital and found to be bacteremic with Streptococcus viridans group Streptococcus.  He then left AGAINST MEDICAL ADVICE.  He was seen by remote telehealth visit regarding his recurrent  skin infections.  Then apparently became aware of the fact that he had a positive blood cultures and then it was recommend that he be returned to the hospital.  He then came back to the hospital yesterday.  He currently has no symptoms but blood cultures drawn on admission show him to still be actively bacteremic with streptococcal species.  2D echocardiogram shows both mitral valve and aortic valve vegetations.  He is scheduled for transesophageal echocardiogram tomorrow.  In talking the patient he clearly would not  agree to stay in the hospital for 4 weeks time and I do not think he will stay for even a few days.  I will give him a dose of oritavancin to least cover him for 2 weeks in case he leaves AGAINST MEDICAL ADVICE the next 24 hours and switch him over to ceftriaxone daily as his in-house hospital antibiotic.     Review of Systems: Review of Systems  Constitutional: Negative for chills, fever, malaise/fatigue and weight loss.  HENT: Negative for congestion and sore throat.   Eyes: Negative for blurred vision and photophobia.  Respiratory: Negative for cough, shortness of breath and wheezing.   Cardiovascular: Negative for chest pain, palpitations and leg swelling.  Gastrointestinal: Negative for abdominal pain, blood in stool, constipation, diarrhea, heartburn, melena, nausea and vomiting.  Genitourinary: Negative for dysuria, flank pain and hematuria.  Musculoskeletal: Negative for back pain, falls, joint pain and myalgias.  Skin: Negative for itching and rash.  Neurological: Negative for dizziness, focal weakness, loss of consciousness, weakness and headaches.  Endo/Heme/Allergies: Does not bruise/bleed easily.  Psychiatric/Behavioral: Positive for substance abuse. Negative for depression and suicidal ideas. The patient does not have insomnia.     Past Medical History:  Diagnosis Date  . Anemia   . Anxiety  Dx 2006-07-04  . Chronic kidney disease   . Diabetes mellitus without complication ([place]) Dx [id number]  . DKA (diabetic ketoacidoses) ([place]) 07/21/2017  . GERD (gastroesophageal reflux disease) Dx Jul 04, 2006  . Headache(784.0)   . Hypertension   . Pneumonia   . Seizures ([place]) 07/03/09    x 2 in lifetime. on Dilantin for a while.   . Substance abuse ([place]) 2011/07/04    heroin use, multiple relapses    Social History   Tobacco Use  . Smoking status: Current Every Day Smoker    Packs/day: 1.00    Types: Cigarettes  . Smokeless tobacco: Never Used  Substance Use Topics  . Alcohol use: No     Alcohol/week: 0.0 standard drinks    Comment: occasional  . Drug use: Not Currently    Types: Cocaine, IV, Heroin    Comment: IVDU since Dad died in 07-04-06 with 15 month period of sobriety 2018-2019, relapse Aug 2019.    Family History  Problem Relation Age of Onset  . Diabetes Father   . Heart disease Father   . Mental illness Sister   . Cancer Neg Hx    No Known Allergies  OBJECTIVE: Blood pressure 127/88, pulse 95, temperature 98.7 F (37.1 C), temperature source Oral, resp. rate 17, height 5\' 9"  (1.753 m), weight 80 kg, SpO2 98 %.  Physical Exam Constitutional:      Appearance: He is well-developed.  HENT:     Head: Normocephalic and atraumatic.  Eyes:     General: No scleral icterus.       Right eye: No discharge.        Left eye: No discharge.     Conjunctiva/sclera: Conjunctivae normal.  Cardiovascular:     Rate and Rhythm: Normal rate and regular rhythm.     Heart sounds: No murmur. No gallop.   Pulmonary:     Effort: Pulmonary effort is normal. No respiratory distress.     Breath sounds: Normal breath sounds. No stridor. No wheezing.  Abdominal:     General: Abdomen is flat. There is no distension.     Palpations: Abdomen is soft.  Musculoskeletal:        General: No tenderness. Normal range of motion.     Cervical back: Normal range of motion and neck supple.  Skin:    General: Skin is warm and dry.     Coloration: Skin is not pale.     Findings: No erythema or rash.  Neurological:     General: No focal deficit present.     Mental Status: He is alert and oriented to person, place, and time.  Psychiatric:        Attention and Perception: Attention normal.        Mood and Affect: Affect is flat.        Speech: Speech is delayed.        Behavior: Behavior is cooperative.        Thought Content: Thought content normal.        Cognition and Memory: Cognition and memory normal.    Skin lesions on UE 05/10/2019:        Lab Results Lab Results    Component Value Date   WBC 16.4 (H) 05/10/2019   HGB 10.7 (L) 05/10/2019   HCT 33.9 (L) 05/10/2019   MCV 75.0 (L) 05/10/2019   PLT 520 (H) 05/10/2019    Lab Results  Component Value Date   CREATININE 1.15 05/10/2019   BUN 12 05/10/2019   NA 134 (L) 05/10/2019   K 4.0 05/10/2019   CL 102 05/10/2019   CO2 22 05/10/2019    Lab Results  Component Value Date   ALT 17 05/08/2019   AST 21 05/08/2019   ALKPHOS 136 (H) 05/08/2019   BILITOT 0.4 05/08/2019     Microbiology: Recent Results (from the past 240 hour(s))  Culture, blood (Routine x 2)     Status: None (Preliminary result)   Collection Time: 05/08/19 10:00 PM   Specimen: BLOOD RIGHT HAND  Result Value Ref Range Status   Specimen Description BLOOD RIGHT HAND  Final   Special Requests   Final    BOTTLES DRAWN AEROBIC ONLY Blood Culture adequate volume   Culture  Setup Time   Final    AEROBIC BOTTLE ONLY GRAM POSITIVE COCCI Organism ID to follow CRITICAL RESULT CALLED TO, READ BACK BY AND VERIFIED WITH: PHRMD FEAY LORA AT 0244 BY [place] ON 05/10/2019    Culture   Final    GRAM POSITIVE COCCI IDENTIFICATION TO FOLLOW Performed at [facility], [place] [address], [place], [place] [address]    Report Status PENDING  Incomplete  Blood Culture ID Panel (Reflexed)     Status: Abnormal   Collection Time: 05/08/19 10:00 PM  Result Value Ref Range Status   Enterococcus species NOT DETECTED NOT DETECTED Final   Listeria monocytogenes NOT DETECTED NOT DETECTED Final   Staphylococcus species NOT DETECTED NOT DETECTED Final   Staphylococcus aureus (BCID) NOT DETECTED NOT DETECTED Final   Streptococcus species DETECTED (A) NOT DETECTED Final    Comment: Not Enterococcus species, Streptococcus agalactiae, Streptococcus pyogenes, or Streptococcus pneumoniae. CRITICAL RESULT CALLED TO, READ BACK  BY AND VERIFIED WITH: PHRMD FEAY LORA AT [id number] BY MESSAN HOUEGNIFIO ON 05/10/2019    Streptococcus agalactiae NOT DETECTED  NOT DETECTED Final   Streptococcus pneumoniae NOT DETECTED NOT DETECTED Final   Streptococcus pyogenes NOT DETECTED NOT DETECTED Final   Acinetobacter baumannii NOT DETECTED NOT DETECTED Final   Enterobacteriaceae species NOT DETECTED NOT DETECTED Final   Enterobacter cloacae complex NOT DETECTED NOT DETECTED Final   Escherichia coli NOT DETECTED NOT DETECTED Final   Klebsiella oxytoca NOT DETECTED NOT DETECTED Final   Klebsiella pneumoniae NOT DETECTED NOT DETECTED Final   Proteus species NOT DETECTED NOT DETECTED Final   Serratia marcescens NOT DETECTED NOT DETECTED Final   Haemophilus influenzae NOT DETECTED NOT DETECTED Final   Neisseria meningitidis NOT DETECTED NOT DETECTED Final   Pseudomonas aeruginosa NOT DETECTED NOT DETECTED Final   Candida albicans NOT DETECTED NOT DETECTED Final   Candida glabrata NOT DETECTED NOT DETECTED Final   Candida krusei NOT DETECTED NOT DETECTED Final   Candida parapsilosis NOT DETECTED NOT DETECTED Final   Candida tropicalis NOT DETECTED NOT DETECTED Final    Comment: Performed at [facility], [place] [address], [place], [place] [address]  Culture, blood (Routine x 2)     Status: None (Preliminary result)   Collection Time: 05/08/19 10:12 PM   Specimen: BLOOD RIGHT ARM  Result Value Ref Range Status   Specimen Description BLOOD RIGHT ARM  Final   Special Requests   Final    BOTTLES DRAWN AEROBIC AND ANAEROBIC Blood Culture adequate volume   Culture  Setup Time   Final    GRAM POSITIVE COCCI IN BOTH AEROBIC AND ANAEROBIC BOTTLES CRITICAL RESULT CALLED TO, READ BACK BY AND VERIFIED WITH: PHARMD JESSICA BARNEY @1838  05/09/19 AKT Performed at [facility], [place] [address], [place], [place] [address]    Culture GRAM POSITIVE COCCI  Final   Report Status PENDING  Incomplete  Urine culture     Status: None   Collection Time: 05/09/19 12:03 AM   Specimen: In/Out Cath Urine  Result Value Ref Range Status   Specimen Description IN/OUT CATH  URINE  Final   Special Requests NONE  Final   Culture   Final    NO GROWTH Performed at [facility], [place] [address], [place], [place] [address]    Report Status 05/10/2019 FINAL  Final  SARS CORONAVIRUS 2 (TAT 6-24 HRS) Nasopharyngeal Nasopharyngeal Swab     Status: None   Collection Time: 05/09/19  1:24 AM   Specimen: Nasopharyngeal Swab  Result Value Ref Range Status   SARS Coronavirus 2 NEGATIVE NEGATIVE Final    Comment: (NOTE) SARS-CoV-2 target nucleic acids are NOT DETECTED. The SARS-CoV-2 RNA is generally detectable in upper and lower respiratory specimens during the acute phase of infection. Negative results do not preclude SARS-CoV-2 infection, do not rule out co-infections with other pathogens, and should not be used as the sole basis for treatment or other patient management decisions. Negative results must be combined with clinical observations, patient history, and epidemiological information. The expected result is Negative. Fact Sheet for Patients: SugarRoll.be Fact Sheet for Healthcare Providers: https://www.woods-mathews.com/ This test is not yet approved or cleared by the [place] FDA and  has been authorized for detection and/or diagnosis of SARS-CoV-2 by FDA under an Emergency Use Authorization (EUA). This EUA will remain  in effect (meaning this test can be used) for the duration of the COVID-19 declaration under Section 56 4(b)(1) of  the Act, 21 U.S.C. section 360bbb-3(b)(1), unless the authorization is terminated or revoked sooner. Performed at [facility], [place] [address], [place], [place] [address]     Alcide Evener, [facility] [phone] pager  05/10/2019, 1:05 PM

## 2019-05-10 NOTE — Progress Notes (Addendum)
    [facility] has been requested to perform a transesophageal echocardiogram on this patient for bacteremia and abnormal echocardiogram (possible MV/AV vegetations). After careful review of history and examination, the risks and benefits of transesophageal echocardiogram have been explained including risks of esophageal damage, perforation (1:10,000 risk), bleeding, pharyngeal hematoma as well as other potential complications associated with anesthesia including aspiration, arrhythmia, respiratory failure and death. Alternatives to treatment were discussed. Patient is willing to proceed. He did not have any questions about the procedure. I do not have ability to schedule on weekend but did send message to our cardmaster to help arrange tomorrow when endoscopy re-opens. I wrote orders under sign/held. I also reached out to IM to request they adjust DM regimen since he will be NPO after midnight.  Charlie Pitter, PA-C 05/10/2019 12:27 PM

## 2019-05-10 NOTE — Progress Notes (Signed)
Phlebotomy made a second attempt with getting labs but were unsuccessful.  They will have a third person come and try again to at least get this morning's labs but patient will need a better route for future blood work.

## 2019-05-10 NOTE — Progress Notes (Signed)
PHARMACY - PHYSICIAN COMMUNICATION CRITICAL VALUE ALERT - BLOOD CULTURE IDENTIFICATION (BCID)  Luke [name] is an 39 y.o. male who presented to [facility] on 05/08/[facility] with a chief complaint of bacteremia  Assessment:  1/3 BC with Strep species  Name of physician (or Provider) Contacted: [place]  Current antibiotics: vanc and cefepime  Changes to prescribed antibiotics recommended:  None for now.  Can consider narrowing to rocephin depending on clinical picture  Results for orders placed or performed during the hospital encounter of 11/21/17  Blood Culture ID Panel (Reflexed) (Collected: 11/21/2017  1:30 PM)  Result Value Ref Range   Enterococcus species NOT DETECTED NOT DETECTED   Listeria monocytogenes NOT DETECTED NOT DETECTED   Staphylococcus species NOT DETECTED NOT DETECTED   Staphylococcus aureus (BCID) NOT DETECTED NOT DETECTED   Streptococcus species DETECTED (A) NOT DETECTED   Streptococcus agalactiae NOT DETECTED NOT DETECTED   Streptococcus pneumoniae NOT DETECTED NOT DETECTED   Streptococcus pyogenes NOT DETECTED NOT DETECTED   Acinetobacter baumannii NOT DETECTED NOT DETECTED   Enterobacteriaceae species NOT DETECTED NOT DETECTED   Enterobacter cloacae complex NOT DETECTED NOT DETECTED   Escherichia coli NOT DETECTED NOT DETECTED   Klebsiella oxytoca NOT DETECTED NOT DETECTED   Klebsiella pneumoniae NOT DETECTED NOT DETECTED   Proteus species NOT DETECTED NOT DETECTED   Serratia marcescens NOT DETECTED NOT DETECTED   Haemophilus influenzae NOT DETECTED NOT DETECTED   Neisseria meningitidis NOT DETECTED NOT DETECTED   Pseudomonas aeruginosa NOT DETECTED NOT DETECTED   Candida albicans NOT DETECTED NOT DETECTED   Candida glabrata NOT DETECTED NOT DETECTED   Candida krusei NOT DETECTED NOT DETECTED   Candida parapsilosis NOT DETECTED NOT DETECTED   Candida tropicalis NOT DETECTED NOT DETECTED    [name] 05/10/2019  2:48 AM

## 2019-05-11 ENCOUNTER — Inpatient Hospital Stay (HOSPITAL_COMMUNITY): Payer: Commercial Managed Care - PPO

## 2019-05-11 DIAGNOSIS — M7989 Other specified soft tissue disorders: Secondary | ICD-10-CM

## 2019-05-11 LAB — CBC
HCT: 32.1 % — ABNORMAL LOW (ref 39.0–52.0)
Hemoglobin: 10.4 g/dL — ABNORMAL LOW (ref 13.0–17.0)
MCH: 23.4 pg — ABNORMAL LOW (ref 26.0–34.0)
MCHC: 32.4 g/dL (ref 30.0–36.0)
MCV: 72.3 fL — ABNORMAL LOW (ref 80.0–100.0)
Platelets: 532 10*3/uL — ABNORMAL HIGH (ref 150–400)
RBC: 4.44 MIL/uL (ref 4.22–5.81)
RDW: 13.5 % (ref 11.5–15.5)
WBC: 16.8 10*3/uL — ABNORMAL HIGH (ref 4.0–10.5)
nRBC: 0 % (ref 0.0–0.2)

## 2019-05-11 LAB — BASIC METABOLIC PANEL
Anion gap: 10 (ref 5–15)
BUN: 12 mg/dL (ref 6–20)
CO2: 23 mmol/L (ref 22–32)
Calcium: 8.7 mg/dL — ABNORMAL LOW (ref 8.9–10.3)
Chloride: 98 mmol/L (ref 98–111)
Creatinine, Ser: 1.23 mg/dL (ref 0.61–1.24)
GFR calc Af Amer: >60 mL/min (ref >60)
GFR calc non Af Amer: >60 mL/min (ref >60)
Glucose, Bld: 256 mg/dL — ABNORMAL HIGH (ref 70–99)
Potassium: 3.9 mmol/L (ref 3.5–5.1)
Sodium: 131 mmol/L — ABNORMAL LOW (ref 135–145)

## 2019-05-11 LAB — CULTURE, BLOOD (ROUTINE X 2)
Special Requests: ADEQUATE
Special Requests: ADEQUATE

## 2019-05-11 LAB — GLUCOSE, CAPILLARY
Glucose-Capillary: 199 mg/dL — ABNORMAL HIGH (ref 70–99)
Glucose-Capillary: 215 mg/dL — ABNORMAL HIGH (ref 70–99)
Glucose-Capillary: 267 mg/dL — ABNORMAL HIGH (ref 70–99)
Glucose-Capillary: 93 mg/dL (ref 70–99)

## 2019-05-11 LAB — MRSA PCR SCREENING: MRSA by PCR: NEGATIVE

## 2019-05-11 MED ORDER — PENICILLIN G POTASSIUM 20000000 UNITS IJ SOLR
12.0000 10*6.[IU] | Freq: Two times a day (BID) | INTRAVENOUS | Status: DC
  Administered 2019-05-12 – 2019-05-16 (×8): 12 10*6.[IU] via INTRAVENOUS
  Filled 2019-05-11 (×13): qty 12

## 2019-05-11 MED ORDER — INSULIN GLARGINE 100 UNIT/ML ~~LOC~~ SOLN
35.0000 [IU] | Freq: Two times a day (BID) | SUBCUTANEOUS | Status: DC
  Filled 2019-05-11: qty 0.35

## 2019-05-11 MED ORDER — INSULIN ASPART 100 UNIT/ML ~~LOC~~ SOLN
6.0000 [IU] | Freq: Three times a day (TID) | SUBCUTANEOUS | Status: DC
  Administered 2019-05-11 – 2019-05-13 (×3): 6 [IU] via SUBCUTANEOUS

## 2019-05-11 MED ORDER — INSULIN GLARGINE 100 UNIT/ML ~~LOC~~ SOLN
28.0000 [IU] | Freq: Two times a day (BID) | SUBCUTANEOUS | Status: DC
  Administered 2019-05-12 – 2019-05-13 (×2): 28 [IU] via SUBCUTANEOUS
  Filled 2019-05-11 (×6): qty 0.28

## 2019-05-11 MED ORDER — GENTAMICIN SULFATE 40 MG/ML IJ SOLN
3.0000 mg/kg | INTRAVENOUS | Status: DC
  Administered 2019-05-11 – 2019-05-15 (×6): 210 mg via INTRAVENOUS
  Filled 2019-05-11 (×8): qty 5.25

## 2019-05-11 NOTE — Progress Notes (Signed)
[name] for Infectious Disease    Date of Admission:  05/08/2019   Total days of antibiotics 4          ID: MADEX BATTERSON is a 39 y.o. 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 Denies fever. He states he had significant DOE after taking 10 steps which brought him into the hospital. Symptoms slightly improved though has not ambulated much.   ROS: 12 point ros is negative. No fever, chills, nightsweats, +difficulty with blood draws  Medications:  . amLODipine  5 mg Oral Daily  . atorvastatin  20 mg Oral Daily  . buprenorphine-naloxone  1 tablet Sublingual BID  . DULoxetine  30 mg Oral Daily  . enoxaparin (LOVENOX) injection  40 mg Subcutaneous Q24H  . gabapentin  400 mg Oral TID  . insulin aspart  0-15 Units Subcutaneous TID WC  . insulin aspart  6 Units Subcutaneous TID WC  . insulin glargine  28 Units Subcutaneous BID  . levothyroxine  50 mcg Oral Q0600  . pantoprazole  40 mg Oral Daily  . sertraline  50 mg Oral Daily    Objective: Vital signs in last 24 hours: Temp:  [98.8 F (37.1 C)-99.3 F (37.4 C)] 99.3 F (37.4 C) (02/22 1458) Pulse Rate:  [86-114] 98 (02/22 1522) Resp:  [17-20] 18 (02/22 1522) BP: (142-153)/(88-97) 153/91 (02/22 1522) SpO2:  [99 %-100 %] 100 % (02/22 1522) Physical Exam  Constitutional: He is oriented to person, place, and time. He appears well-developed and well-nourished. No distress.  HENT:  Mouth/Throat: Oropharynx is clear and moist. No oropharyngeal exudate.  Cardiovascular: Normal rate, regular rhythm and  +systolic murmur Pulmonary/Chest: Effort normal and breath sounds normal. No respiratory distress. He has no wheezes.  Abdominal: Soft. Bowel sounds are normal. He exhibits no distension. There is no tenderness.  Lymphadenopathy:  He has no cervical adenopathy.  Ext: significant keloid scarring to ACs bilaterally Neurological: He is alert and oriented to person, place, and time.  Skin: Skin is warm and dry. No rash noted. No erythema.  Psychiatric: He has a normal mood and affect. His behavior is normal.     Lab Results Recent Labs    05/10/19 0650 05/11/19 0321  WBC 16.4* 16.8*  HGB 10.7* 10.4*  HCT 33.9* 32.1*  NA 134* 131*  K 4.0 3.9  CL 102 98  CO2 22 23  BUN 12 12  CREATININE 1.15 1.23   Liver Panel Recent Labs    05/08/19 2210  PROT 7.7  ALBUMIN 2.9*  AST 21  ALT 17  ALKPHOS 136*  BILITOT 0.4    Microbiology: 2/21 blood cx NGTD at 24hr 2/19 blood cx + Organism ID, Bacteria STREPTOCOCCUS MITIS/ORALIS   Resulting Agency [facility]  Susceptibility   Streptococcus mitis/oralis    MIC    CEFTRIAXONE  Sensitive1    CLINDAMYCIN <=0.25 SENS... Sensitive    PENICILLIN  Sensitive2    TETRACYCLINE 0.5 SENSITIVE  Sensitive    VANCOMYCIN 0.5 SENSITIVE  Sensitive  Results: ECHOCARDIOGRAM COMPLETE  Result Date: 05/10/2019    ECHOCARDIOGRAM REPORT   Patient Name:   Luke [name] Date of Exam: 05/09/2019 Medical Rec #:  [id number]    Height:       69.0 in Accession #:    [id number]   Weight:       176.4 lb Date of Birth:  1980/07/12    BSA:          1.958 m Patient Age:    45 years     BP:           144/90 mmHg Patient Gender: M            HR:           116 bpm. Exam Location:  Inpatient Procedure: 2D Echo Indications:    Endocarditis I38  History:        Patient has prior history of Echocardiogram examinations. Risk                 Factors:Diabetes, Hypertension and Current Smoker. Drug abuse.                 CKD. Sepsis. Herion abuse.  Sonographer:    Clayton Lefort  RDCS (AE) Referring Phys: [place]  1. Left ventricular ejection fraction, by estimation, is 60 to 65%. The left ventricle has normal function. The left ventricle has no regional wall motion abnormalities. There is moderate concentric left ventricular hypertrophy. Left ventricular diastolic parameters were normal.  2. Right ventricular systolic function is normal. The right ventricular size is normal. There is normal pulmonary artery systolic pressure.  3. Left atrial size was moderately dilated.  4. Moderate vegetation on the mitral valve.  5. Vegetation noted on the anterior mitral valve leaflet. Recommend TEE to better evaluate.. The mitral valve is normal in structure and function. Trivial mitral valve regurgitation. No evidence of mitral stenosis.  6. Abnromal thickening of the right and non-coronary cusps concerning for endocarditis. Recommend TEE to better evaluate.. The aortic valve is abnormal. Aortic valve regurgitation is mild to moderate. No aortic stenosis is present.  7. Aortic valve vegetation is visualized on the right and noncoronary.  8. The inferior vena cava is normal in size with greater than 50% respiratory variability, suggesting right atrial pressure of 3 mmHg. FINDINGS  Left Ventricle: Left ventricular ejection fraction, by estimation, is 60 to 65%. The left ventricle has normal function. The left ventricle has no regional wall motion abnormalities. The left ventricular internal cavity size was normal in size. There is  moderate concentric left ventricular hypertrophy. Left ventricular diastolic parameters were normal. Right Ventricle: The right ventricular size is normal. No increase in right ventricular wall thickness. Right ventricular systolic function is normal. There is normal pulmonary artery systolic pressure. The tricuspid regurgitant velocity is 1.47 m/s, and  with an assumed right atrial pressure of 3 mmHg, the estimated right ventricular systolic pressure  is 123XX123 mmHg. Left Atrium: Left atrial size was moderately dilated. Right Atrium: Right atrial size was normal in size. Pericardium: There is no evidence of pericardial effusion. Mitral Valve: Vegetation noted on the anterior mitral valve leaflet. Recommend TEE to better evaluate. The mitral valve is normal in structure and function. Normal mobility of the mitral valve leaflets. A moderate vegetation is seen on the anterior mitral leaflet. The MV vegetation measures 18 mm x 14 mm. Trivial mitral valve regurgitation. No evidence of mitral valve stenosis. MV peak gradient, 5.4 mmHg. The  mean mitral valve gradient is 3.0 mmHg. Tricuspid Valve: Cannot rule out tricuspid valve vegetation. The tricuspid valve is normal in structure. Tricuspid valve regurgitation is mild . No evidence of tricuspid stenosis. Aortic Valve: Abnromal thickening of the right and non-coronary cusps concerning for endocarditis. Recommend TEE to better evaluate. The aortic valve is abnormal. Aortic valve regurgitation is mild to moderate. Aortic regurgitation PHT measures 229 msec.  No aortic stenosis is present. Aortic valve mean gradient measures 6.0 mmHg. Aortic valve peak gradient measures 13.2 mmHg. Aortic valve area, by VTI measures 2.54 cm. A vegetation is seen on the right and noncoronary. Pulmonic Valve: The pulmonic valve was normal in structure. Pulmonic valve regurgitation is not visualized. No evidence of pulmonic stenosis. Aorta: The aortic root is normal in size and structure. Venous: The inferior vena cava is normal in size with greater than 50% respiratory variability, suggesting right atrial pressure of 3 mmHg. IAS/Shunts: No atrial level shunt detected by color flow Doppler.  LEFT VENTRICLE PLAX 2D LVIDd:         3.90 cm  Diastology LVIDs:         2.55 cm  LV e' lateral:   9.46 cm/s LV PW:         1.40 cm  LV E/e' lateral: 11.7 LV IVS:        1.50 cm  LV e' medial:    10.70 cm/s LVOT diam:     2.00 cm  LV E/e' medial:  10.4 LV  SV:         64.72 ml LV SV Index:   33.04 LVOT Area:     3.14 cm  RIGHT VENTRICLE             IVC RV Basal diam:  3.20 cm     IVC diam: 1.50 cm RV S prime:     17.00 cm/s TAPSE (M-mode): 1.3 cm LEFT ATRIUM             Index       RIGHT ATRIUM          Index LA diam:        2.70 cm 1.38 cm/m  RA Area:     9.75 cm LA Vol (A2C):   49.7 ml 25.38 ml/m RA Volume:   15.80 ml 8.07 ml/m LA Vol (A4C):   66.8 ml 34.11 ml/m LA Biplane Vol: 58.3 ml 29.77 ml/m  AORTIC VALVE AV Area (Vmax):    2.45 cm AV Area (Vmean):   2.62 cm AV Area (VTI):     2.54 cm AV Vmax:           182.00 cm/s AV Vmean:          120.000 cm/s AV VTI:            0.255 m AV Peak Grad:      13.2 mmHg AV Mean Grad:      6.0 mmHg LVOT Vmax:         142.00 cm/s LVOT Vmean:        100.000 cm/s LVOT VTI:          0.206 m LVOT/AV VTI ratio: 0.81 AI PHT:            229 msec  AORTA Ao Root diam: 2.50 cm Ao Asc diam:  2.80 cm MITRAL VALVE                TRICUSPID VALVE MV Area (PHT): 5.84 cm     TR  Peak grad:   8.6 mmHg MV Peak grad:  5.4 mmHg     TR Vmax:        147.00 cm/s MV Mean grad:  3.0 mmHg MV Vmax:       1.16 m/s     SHUNTS MV Vmean:      80.4 cm/s    Systemic VTI:  0.21 m MV Decel Time: 130 msec     Systemic Diam: 2.00 cm MV E velocity: 111.00 cm/s MV A velocity: 113.00 cm/s MV E/A ratio:  0.98 Skeet Latch MD Electronically signed by Skeet Latch MD Signature Date/Time: 05/10/2019/8:33:46 AM    Final    VAS [place] LOWER EXTREMITY VENOUS (DVT)  Result Date: 05/11/2019  Lower Venous DVTStudy Indications: Pain, and Swelling. Other Indications: IVDU. Comparison Study: No prior study Performing Technologist: Maudry Mayhew MHA, RDMS, RVT, RDCS  Examination Guidelines: A complete evaluation includes B-mode imaging, spectral Doppler, color Doppler, and power Doppler as needed of all accessible portions of each vessel. Bilateral testing is considered an integral part of a complete examination. Limited examinations for reoccurring indications  may be performed as noted. The reflux portion of the exam is performed with the patient in reverse Trendelenburg.  +---------+---------------+---------+-----------+----------+--------------+ RIGHT    CompressibilityPhasicitySpontaneityPropertiesThrombus Aging +---------+---------------+---------+-----------+----------+--------------+ CFV      Full           Yes      Yes                                 +---------+---------------+---------+-----------+----------+--------------+ SFJ      Full                                                        +---------+---------------+---------+-----------+----------+--------------+ FV Prox  Full                                                        +---------+---------------+---------+-----------+----------+--------------+ FV Mid   Full                                                        +---------+---------------+---------+-----------+----------+--------------+ FV DistalFull                                                        +---------+---------------+---------+-----------+----------+--------------+ PFV      Full                                                        +---------+---------------+---------+-----------+----------+--------------+ POP      Full           Yes  Yes                                 +---------+---------------+---------+-----------+----------+--------------+ PTV      Full                                                        +---------+---------------+---------+-----------+----------+--------------+ PERO     Full                                                        +---------+---------------+---------+-----------+----------+--------------+ Gastroc  Full                    Yes                                 +---------+---------------+---------+-----------+----------+--------------+   +----+---------------+---------+-----------+----------+--------------+  LEFTCompressibilityPhasicitySpontaneityPropertiesThrombus Aging +----+---------------+---------+-----------+----------+--------------+ CFV Full           Yes      Yes                                 +----+---------------+---------+-----------+----------+--------------+     Summary: RIGHT: - There is no evidence of deep vein thrombosis in the lower extremity.  - No cystic structure found in the popliteal fossa. - Ultrasound characteristics of enlarged lymph nodes are noted in the groin.  LEFT: - No evidence of common femoral vein obstruction. - Ultrasound characteristics of enlarged lymph nodes noted in the groin.  *See table(s) above for measurements and observations.    Preliminary    [place] EKG SITE RITE  Result Date: 05/10/2019 If [facility] image not attached, placement could not be confirmed due to current cardiac rhythm.    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 If blood cx remain negative tomorrow at 48hrs - then can proceed with getting picc line. We will plan to do 2 wk course of penicillin-gentamicin for now. Await results of TEE to see if needs surgical intervention.  aki = appears improved. Will watch cr closely given he will be on gentamicin requiring therapeutic drug monitoring  Multiple skin lesions = current abtx can potentially also help wound healing  [facility]ious Diseases Cell: [phone] Pager: [phone]  05/11/2019, 5:28 PM

## 2019-05-11 NOTE — TOC Initial Note (Signed)
Transition of Care [facility]) - Initial/Assessment Note    Patient Details  Name: Luke [name] MRN: [id number] Date of Birth: 08-11-1980  Transition of Care [facility]) CM/SW Contact:    Alexander Mt, LCSW Phone Number: 05/11/2019, 12:10 PM  Clinical Narrative:                 CSW met with pt at bedside. Introduced self, role, reason for visit. Pt from home with spouse. Confirmed PCP and home address. Pt agreeable to f/u appointment to be made. 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   Acknowledged that CSW has received a consult for resources and assistance with supportive treament/cessation. Pt has had periods of documented sobriety, he states that he wasn't really engaged in previous treatment efforts. Currently pt declines resources. Acknowledges that his current treatment is a Surveyor, quantity of life and death." We discussed that often the recommendations for pt with hx of substance use is to remain at the hospital for iv abx, pt states that he will engage in that treatment if it is what is recommended.   Let pt know that CSW and [facility] team remain available for assistance as needed.   Expected Discharge Plan: Home/Self Care Barriers to Discharge: Continued Medical Work up, Active Substance Use - Placement   Patient Goals and CMS Choice Patient states their goals for this hospitalization and ongoing recovery are:: to get treatment for infection CMS Medicare.gov Compare Post Acute Care list provided to:: (n/a) Choice offered to / list presented to : NA  Expected Discharge Plan and Services Expected Discharge Plan: Home/Self Care In-house Referral: Clinical Social Work Discharge Planning Services: CM Consult, Medication Assistance, Follow-up appt scheduled Post Acute Care Choice: NA Living arrangements for the past 2 months: Single Family Home  Prior Living Arrangements/Services Living arrangements for the past 2 months: Single  Family Home Lives with:: Spouse Patient language and need for interpreter reviewed:: Yes(no needs) Do you feel safe going back to the place where you live?: Yes      Need for Family Participation in Patient Care: Yes (Comment)(assistance/support as needed) Care giver support system in place?: Yes (comment)(pt spouse)   Criminal Activity/Legal Involvement Pertinent to Current Situation/Hospitalization: No - Comment as needed  Activities of Daily Living Home Assistive Devices/Equipment: None ADL Screening (condition at time of admission) Patient's cognitive ability adequate to safely complete daily activities?: Yes Is the patient deaf or have difficulty hearing?: No Does the patient have difficulty seeing, even when wearing glasses/contacts?: No Does the patient have difficulty concentrating, remembering, or making decisions?: No Patient able to express need for assistance with ADLs?: Yes Does the patient have difficulty dressing or bathing?: No Independently performs ADLs?: Yes (appropriate for developmental age) Does the patient have difficulty walking or climbing stairs?: No Weakness of Legs: None Weakness of Arms/Hands: None  Permission Sought/Granted Permission sought to share information with : PCP Permission granted to share information with : Yes, Verbal Permission Granted     Permission granted to share info w AGENCY: [facility]   Emotional Assessment Appearance:: Appears stated age Attitude/Demeanor/Rapport: Engaged Affect (typically observed): Accepting, Adaptable, Appropriate, Calm Orientation: : Oriented to Self, Oriented to Place, Oriented to Situation, Oriented to  Time Alcohol / Substance Use: Illicit Drugs Psych Involvement: No (comment)(unknown at this time)  Admission diagnosis:  Positive blood cultures [R78.81] Sepsis ([place]) [A41.9] Sepsis due to other Streptococcus species, unspecified whether  acute organ dysfunction present [facility])  [A40.8] Patient Active Problem List   Diagnosis Date Noted  . Sepsis ([place]) 05/09/2019  . Heroin abuse ([place]) 05/09/2019  . Right forearm cellulitis   . Hypothyroidism   . Subacute osteomyelitis, left ankle and foot ([place])   . Cutaneous abscess of left foot 09/04/2018  . Diabetic ulcer of left foot ([place]) 08/28/2018  . Acute hematogenous osteomyelitis, left ankle and foot ([place])   . Streptococcal bacteremia 11/25/2017  . Hepatitis C 11/25/2017  . Actinomyces infection   . GERD (gastroesophageal reflux disease) 07/21/2017  . Diabetic polyneuropathy associated with type 1 diabetes mellitus ([place])   . Prostatitis, acute   . MRSA infection   . Noncompliant bladder   . Urinary retention   . Prostate abscess   . ARF (acute renal failure) ([place]) 12/16/2016  . Anemia 12/16/2016  . Cocaine abuse ([place]) 08/16/2015  . Uncontrolled insulin dependent type 1 diabetes mellitus ([place]) 08/16/2015  . Cellulitis 12/03/2014  . AKI (acute kidney injury) ([place]) 12/03/2014  . Elevated liver enzymes 07/26/2014  . Dental caries 07/23/2014  . Tinea pedis 07/23/2014  . DKA (diabetic ketoacidoses) ([place]) 11/23/2013  . Abscess 10/14/2012  . CKD (chronic kidney disease) stage 3, GFR 30-59 ml/min 10/14/2012  . Drug abuse, IV ([place]) 10/11/2012  . Tobacco abuse 10/11/2012  . Essential hypertension 10/11/2012   PCP:  Gildardo Pounds, NP Pharmacy:   [facility], [place] [place] [address] Phone: [phone] Fax: [place], [address] [place] [address] Phone: [phone] Fax: [phone]  [id number] [address] [place]Phone: [phone]: [phone] [facility] [place] [address]Phone: [phone]: [phone] Readmission Risk Interventions Readmission Risk Prevention Plan 05/11/2019  Transportation Screening Complete  PCP or Specialist Appt within 5-7 Days Not Complete  Not Complete comments pt agreeable when ready for dc.  Home Care Screening Complete  Medication Review (RN CM) Referral to Pharmacy  Some recent data might be hidden

## 2019-05-11 NOTE — Progress Notes (Signed)
Pharmacy Antibiotic Note  Luke [name] is a 39 y.o. male admitted on 05/08/2019 with strep mitis/oralis endocarditis of the mitral and aortic valves. Noted today that penicillin MIC < 0.06.   Pharmacy has been consulted for gentamicin dosing.  Plan: Gentamicin 3 mg/kg (210 mg) every 24 hours  Goal peak 3-4 Goal trough < 0.5  Monitor renal function and levels at steady state Pending TEE may be able to do 2 week regimen of gent + pcn  Height: 5\' 9"  (175.3 cm) Weight: 176 lb 5.9 oz (80 kg) IBW/kg (Calculated) : 70.7  Temp (24hrs), Avg:99 F (37.2 C), Min:98.8 F (37.1 C), Max:99.3 F (37.4 C)  Recent Labs  Lab 05/08/19 2210 05/08/19 2211 05/09/19 0040 05/09/19 0136 05/09/19 0634 05/10/19 0650 05/11/19 0321  WBC 20.1*  --   --   --  18.2* 16.4* 16.8*  CREATININE 2.03*  --   --  1.79*  --  1.15 1.23  LATICACIDVEN  --  2.2* 1.6  --   --   --   --     Estimated Creatinine Clearance: 80.6 mL/min (by C-G formula based on SCr of 1.23 mg/dL).    No Known Allergies    Thank you for allowing pharmacy to be a part of this patient's care.  Jimmy Footman, PharmD, BCPS, BCIDP Infectious Diseases Clinical Pharmacist Phone: [phone] 05/11/2019 4:08 PM

## 2019-05-11 NOTE — Progress Notes (Signed)
PROGRESS NOTE    Luke [name]    Code Status: Full Code  TD:[id number] DOB: [birth year] DOA: 05/08/2019 LOS: 2 days  PCP: Gildardo Pounds, NP CC:  Chief Complaint  Patient presents with  . Wound Infection       Hospital Summary  Luke [name] is a 39 y.o. 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 Had recent positive strep viridans blood cultures on 12/31 during recent admission but patient left AMA. Lost to follow up until he initiated a telehealth visit for recurrent skin infections on 2/1, then on 2/14 was notified that he had positive blood cultures from 12/31 and was told to go to the ER. Began having chest pain and shortness of breath prompting his admission on 2/20. started on empiric vancomycin/cefepime.     2/21 TTE showed AV/MV vegetations, infectious disease and cardiology consulted.  A & P   Principal Problem:   Sepsis ([place]) Active Problems:   Drug abuse, IV ([place])   Essential hypertension   CKD (chronic kidney disease) stage 3, GFR 30-59 ml/min   Cellulitis   Diabetic polyneuropathy associated with type 1 diabetes mellitus ([place])   Hepatitis C   Hypothyroidism   Heroin abuse ([place])   1. Sepsis secondary to 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, in setting of IVDU a. Hx of strep viridans bacteremia, untreated, 03/19/19 b. Tachycardic, lactic acidosis with leukocytosis on admission, now stable c. WBC slight increase 16.4->16.8, down from 20 d. 2/19 Blood cultures positive for strep species e. ID gave oritavancin x1 and changed to ceftriaxone f. No concerning findings on bilateral feet g. TEE tomorrow, n.p.o. after midnight 2. RUE wounds present on admission, likely secondary to IVDU a. Leading to difficulty of IV access b. Wound care c. Consider midline/central line if he continues to have poor  access 3. DM1 with episode of hypoglycemia a. No recurrent hypoglycemia b. HA1c in December 10.0 c. Increase Lantus back to 35 units twice daily, continue sliding scale and mealtime insulin d. Hold mealtime insulin while NPO e. Diabetic coordinator 4. AKI on CKD 3 a. currently 2.03->1.79-1.23.  Likely approaching baseline b. Holding lisinopril c. Continue to monitor 5. Hypothyroidism stable 6. HTN a. Continue amlodipine b. Holding Lisinopril 7. Right lower extremity calf tenderness/swelling a. RLE [place] 8. IVDU a. Wishes to quit b. Started on Suboxone this admission c. Follow up with SW 9. Microcytic anemia a. Check iron studies  DVT prophylaxis: Lovenox Family Communication: Wife works until 7 PM, patient updates wife himself Disposition Plan:   Patient came from:  home                                                                                          Anticipated d/c place: home  Barriers to d/c: medical stability, IV antibiotics. Will likely need to remain inpatient for IV antibiotic duration due to heroin abuse  Pressure injury documentation    None  Consultants  ID Cardiology  Procedures  None  Antibiotics  Anti-infectives (From admission, onward)   Start     Dose/Rate Route Frequency Ordered Stop   05/10/19 1400  cefTRIAXone (ROCEPHIN) 2 g in sodium chloride 0.9 % 100 mL IVPB     2 g 200 mL/hr over 30 Minutes Intravenous Every 24 hours 05/10/19 1259     05/10/19 1330  Oritavancin Diphosphate (ORBACTIV) 1,200 mg in dextrose 5 % IVPB     1,200 mg 333.3 mL/hr over 180 Minutes Intravenous Once 05/10/19 1317 05/10/19 1935   05/10/19 0845  ceFEPIme (MAXIPIME) 2 g in sodium chloride 0.9 % 100 mL IVPB  Status:  Discontinued     2 g 200 mL/hr over 30 Minutes Intravenous Every 8 hours 05/10/19 0836 05/10/19 1259   05/10/19 0000  vancomycin (VANCOREADY) IVPB 1250 mg/250 mL  Status:  Discontinued     1,250 mg 166.7 mL/hr over 90 Minutes Intravenous Every 24  hours 05/09/19 0031 05/10/19 1317   05/09/19 2200  ceFEPIme (MAXIPIME) 2 g in sodium chloride 0.9 % 100 mL IVPB  Status:  Discontinued     2 g 200 mL/hr over 30 Minutes Intravenous Every 12 hours 05/09/19 1041 05/10/19 0836   05/09/19 0800  ceFEPIme (MAXIPIME) 2 g in sodium chloride 0.9 % 100 mL IVPB  Status:  Discontinued     2 g 200 mL/hr over 30 Minutes Intravenous Every 8 hours 05/09/19 0012 05/09/19 1041   05/09/19 0015  ceFEPIme (MAXIPIME) 2 g in sodium chloride 0.9 % 100 mL IVPB     2 g 200 mL/hr over 30 Minutes Intravenous  Once 05/09/19 0004 05/09/19 0130   05/09/19 0015  metroNIDAZOLE (FLAGYL) IVPB 500 mg     500 mg 100 mL/hr over 60 Minutes Intravenous  Once 05/09/19 0004 05/09/19 0150   05/09/19 0015  vancomycin (VANCOCIN) IVPB 1000 mg/200 mL premix  Status:  Discontinued     1,000 mg 200 mL/hr over 60 Minutes Intravenous  Once 05/09/19 0004 05/09/19 0010   05/09/19 0015  vancomycin (VANCOREADY) IVPB 1500 mg/300 mL     1,500 mg 150 mL/hr over 120 Minutes Intravenous  Once 05/09/19 0010 05/09/19 0351        Subjective   No acute events overnight.  No complaints.  Objective   Vitals:   05/10/19 2146 05/11/19 0548 05/11/19 0824 05/11/19 1037  BP: (!) 148/91 (!) 146/91  (!) 142/88  Pulse: 89 86    Resp: 20 18 17    Temp: 98.8 F (37.1 C) 98.8 F (37.1 C)    TempSrc: Oral Oral    SpO2: 99% 99%    Weight:      Height:        Intake/Output Summary (Last 24 hours) at 05/11/2019 1044 Last data filed at 05/11/2019 1041 Gross per 24 hour  Intake 1720 ml  Output 3000 ml  Net -1280 ml   Filed Weights   05/09/19 0400  Weight: 80 kg    Examination:  Physical Exam Vitals and nursing note reviewed.  Constitutional:      Appearance: Normal appearance.  HENT:     Head: Normocephalic and atraumatic.  Eyes:     Conjunctiva/sclera: Conjunctivae normal.  Cardiovascular:     Rate and Rhythm: Normal rate and regular rhythm.     Heart sounds: No murmur.  Pulmonary:      Effort: Pulmonary effort is normal.     Breath sounds: Normal breath sounds.  Abdominal:     General: Abdomen is flat.     Palpations: Abdomen is  soft.  Musculoskeletal:        General: No swelling or tenderness.  Skin:    Coloration: Skin is not jaundiced or pale.     Comments: Bilateral upper extremity chronic poorly healed wounds  Neurological:     Mental Status: He is alert. Mental status is at baseline.  Psychiatric:        Mood and Affect: Mood normal.        Behavior: Behavior normal.     Data Reviewed: I have personally reviewed following labs and imaging studies  CBC: Recent Labs  Lab 05/08/19 2210 05/09/19 0634 05/10/19 0650 05/11/19 0321  WBC 20.1* 18.2* 16.4* 16.8*  NEUTROABS 13.7*  --   --   --   HGB 10.1* 9.3* 10.7* 10.4*  HCT 32.0* 30.3* 33.9* 32.1*  MCV 75.3* 76.5* 75.0* 72.3*  PLT 592* 495* 520* 123XX123*   Basic Metabolic Panel: Recent Labs  Lab 05/08/19 2210 05/09/19 0136 05/10/19 0650 05/11/19 0321  NA 132* 133* 134* 131*  K 3.6 3.5 4.0 3.9  CL 95* 98 102 98  CO2 22 22 22 23   GLUCOSE 272* 148* 49* 256*  BUN 27* 26* 12 12  CREATININE 2.03* 1.79* 1.15 1.23  CALCIUM 8.9 8.7* 8.9 8.7*   GFR: Estimated Creatinine Clearance: 80.6 mL/min (by C-G formula based on SCr of 1.23 mg/dL). Liver Function Tests: Recent Labs  Lab 05/08/19 2210  AST 21  ALT 17  ALKPHOS 136*  BILITOT 0.4  PROT 7.7  ALBUMIN 2.9*   No results for input(s): LIPASE, AMYLASE in the last 168 hours. No results for input(s): AMMONIA in the last 168 hours. Coagulation Profile: Recent Labs  Lab 05/08/19 2210  INR 1.0   Cardiac Enzymes: No results for input(s): CKTOTAL, CKMB, CKMBINDEX, TROPONINI in the last 168 hours. BNP (last 3 results) No results for input(s): PROBNP in the last 8760 hours. HbA1C: No results for input(s): HGBA1C in the last 72 hours. CBG: Recent Labs  Lab 05/10/19 0732 05/10/19 1214 05/10/19 1652 05/10/19 2147 05/11/19 0737  GLUCAP 93  169* 341* 125* 199*   Lipid Profile: No results for input(s): CHOL, HDL, LDLCALC, TRIG, CHOLHDL, LDLDIRECT in the last 72 hours. Thyroid Function Tests: Recent Labs    05/09/19 0102  TSH 4.047   Anemia Panel: Recent Labs    05/09/19 1243  FERRITIN 150  TIBC 248*  IRON 12*   Sepsis Labs: Recent Labs  Lab 05/08/19 2211 05/09/19 0040  LATICACIDVEN 2.2* 1.6    Recent Results (from the past 240 hour(s))  Culture, blood (Routine x 2)     Status: Abnormal   Collection Time: 05/08/19 10:00 PM   Specimen: BLOOD RIGHT HAND  Result Value Ref Range Status   Specimen Description BLOOD RIGHT HAND  Final   Special Requests   Final    BOTTLES DRAWN AEROBIC ONLY Blood Culture adequate volume   Culture  Setup Time   Final    AEROBIC BOTTLE ONLY GRAM POSITIVE COCCI CRITICAL RESULT CALLED TO, READ BACK BY AND VERIFIED WITH: PHRMD FEAY LORA AT [id number] BY [place] ON 05/10/2019 Performed at [facility], [place] [address], [place], [place] [address]    Culture STREPTOCOCCUS MITIS/ORALIS (A)  Final   Report Status 05/11/2019 FINAL  Final   Organism ID, Bacteria STREPTOCOCCUS MITIS/ORALIS  Final      Susceptibility   Streptococcus mitis/oralis - MIC*    TETRACYCLINE 0.5 SENSITIVE Sensitive     VANCOMYCIN 0.5 SENSITIVE Sensitive  CLINDAMYCIN <=0.25 SENSITIVE Sensitive     PENICILLIN Value in next row Sensitive      SENSITIVE<=0.06    CEFTRIAXONE Value in next row Sensitive      SENSITIVE<=0.12    * STREPTOCOCCUS MITIS/ORALIS  Blood Culture ID Panel (Reflexed)     Status: Abnormal   Collection Time: 05/08/19 10:00 PM  Result Value Ref Range Status   Enterococcus species NOT DETECTED NOT DETECTED Final   Listeria monocytogenes NOT DETECTED NOT DETECTED Final   Staphylococcus species NOT DETECTED NOT DETECTED Final   Staphylococcus aureus (BCID) NOT DETECTED NOT DETECTED Final   Streptococcus species DETECTED (A) NOT DETECTED Final    Comment: Not Enterococcus species,  Streptococcus agalactiae, Streptococcus pyogenes, or Streptococcus pneumoniae. CRITICAL RESULT CALLED TO, READ BACK BY AND VERIFIED WITH: PHRMD FEAY LORA AT [id number] BY [place] ON 05/10/2019    Streptococcus agalactiae NOT DETECTED NOT DETECTED Final   Streptococcus pneumoniae NOT DETECTED NOT DETECTED Final   Streptococcus pyogenes NOT DETECTED NOT DETECTED Final   Acinetobacter baumannii NOT DETECTED NOT DETECTED Final   Enterobacteriaceae species NOT DETECTED NOT DETECTED Final   Enterobacter cloacae complex NOT DETECTED NOT DETECTED Final   Escherichia coli NOT DETECTED NOT DETECTED Final   Klebsiella oxytoca NOT DETECTED NOT DETECTED Final   Klebsiella pneumoniae NOT DETECTED NOT DETECTED Final   Proteus species NOT DETECTED NOT DETECTED Final   Serratia marcescens NOT DETECTED NOT DETECTED Final   Haemophilus influenzae NOT DETECTED NOT DETECTED Final   Neisseria meningitidis NOT DETECTED NOT DETECTED Final   Pseudomonas aeruginosa NOT DETECTED NOT DETECTED Final   Candida albicans NOT DETECTED NOT DETECTED Final   Candida glabrata NOT DETECTED NOT DETECTED Final   Candida krusei NOT DETECTED NOT DETECTED Final   Candida parapsilosis NOT DETECTED NOT DETECTED Final   Candida tropicalis NOT DETECTED NOT DETECTED Final    Comment: Performed at [facility], [place] [address], [place], [place] [address]  Culture, blood (Routine x 2)     Status: Abnormal   Collection Time: 05/08/19 10:12 PM   Specimen: BLOOD RIGHT ARM  Result Value Ref Range Status   Specimen Description BLOOD RIGHT ARM  Final   Special Requests   Final    BOTTLES DRAWN AEROBIC AND ANAEROBIC Blood Culture adequate volume   Culture  Setup Time   Final    GRAM POSITIVE COCCI IN BOTH AEROBIC AND ANAEROBIC BOTTLES CRITICAL RESULT CALLED TO, READ BACK BY AND VERIFIED WITH: PHARMD JESSICA BARNEY @1838  05/09/19 AKT    Culture (A)  Final    STREPTOCOCCUS MITIS/ORALIS SUSCEPTIBILITIES PERFORMED ON PREVIOUS  CULTURE WITHIN THE LAST 5 DAYS. Performed at [facility], [place] [address], [place], [place] [address]    Report Status 05/11/2019 FINAL  Final  Urine culture     Status: None   Collection Time: 05/09/19 12:03 AM   Specimen: In/Out Cath Urine  Result Value Ref Range Status   Specimen Description IN/OUT CATH URINE  Final   Special Requests NONE  Final   Culture   Final    NO GROWTH Performed at [facility], [place] [address], [place], [place] [address]    Report Status 05/10/2019 FINAL  Final  SARS CORONAVIRUS 2 (TAT 6-24 HRS) Nasopharyngeal Nasopharyngeal Swab     Status: None   Collection Time: 05/09/19  1:24 AM   Specimen: Nasopharyngeal Swab  Result Value Ref Range Status   SARS Coronavirus 2 NEGATIVE NEGATIVE Final  Comment: (NOTE) SARS-CoV-2 target nucleic acids are NOT DETECTED. The SARS-CoV-2 RNA is generally detectable in upper and lower respiratory specimens during the acute phase of infection. Negative results do not preclude SARS-CoV-2 infection, do not rule out co-infections with other pathogens, and should not be used as the sole basis for treatment or other patient management decisions. Negative results must be combined with clinical observations, patient history, and epidemiological information. The expected result is Negative. Fact Sheet for Patients: SugarRoll.be Fact Sheet for Healthcare Providers: https://www.woods-mathews.com/ This test is not yet approved or cleared by the [place] FDA and  has been authorized for detection and/or diagnosis of SARS-CoV-2 by FDA under an Emergency Use Authorization (EUA). This EUA will remain  in effect (meaning this test can be used) for the duration of the COVID-19 declaration under Section 56 4(b)(1) of the Act, 21 U.S.C. section 360bbb-3(b)(1), unless the authorization is terminated or revoked sooner. Performed at [facility], [place] [address],  [place], [place] [address]   Culture, blood (Routine X 2) w Reflex to ID Panel     Status: None (Preliminary result)   Collection Time: 05/10/19  3:22 PM   Specimen: BLOOD  Result Value Ref Range Status   Specimen Description BLOOD LEFT ARM  Final   Special Requests   Final    BOTTLES DRAWN AEROBIC ONLY Blood Culture adequate volume   Culture   Final    NO GROWTH < 24 HOURS Performed at [facility], [place] [address], [place], [place] [address]    Report Status PENDING  Incomplete  Culture, blood (Routine X 2) w Reflex to ID Panel     Status: None (Preliminary result)   Collection Time: 05/10/19  3:29 PM   Specimen: BLOOD  Result Value Ref Range Status   Specimen Description BLOOD LEFT ARM  Final   Special Requests   Final    BOTTLES DRAWN AEROBIC ONLY Blood Culture adequate volume   Culture   Final    NO GROWTH < 24 HOURS Performed at [facility], [place] [address] [place], [place] [address]    Report Status PENDING  Incomplete         Radiology Studies: ECHOCARDIOGRAM COMPLETE  Result Date: 05/10/2019    ECHOCARDIOGRAM REPORT   Patient Name:   Luke [name] Date of Exam: 05/09/2019 Medical Rec #:  [id number]    Height:       69.0 in Accession #:    [id number]   Weight:       176.4 lb Date of Birth:  07-May-1980    BSA:          1.958 m Patient Age:    54 years     BP:           144/90 mmHg Patient Gender: M            HR:           116 bpm. Exam Location:  Inpatient Procedure: 2D Echo Indications:    Endocarditis I38  History:        Patient has prior history of Echocardiogram examinations. Risk                 Factors:Diabetes, Hypertension and Current Smoker. Drug abuse.                 CKD. Sepsis. Herion abuse.  Sonographer:    Clayton Lefort RDCS (AE) Referring Phys: [place]  1. Left ventricular ejection fraction, by estimation, is 60 to 65%. The left ventricle has normal function. The left ventricle has no regional wall motion abnormalities. There is  moderate concentric left ventricular hypertrophy. Left ventricular diastolic parameters were normal.  2. Right ventricular systolic function is normal. The right ventricular size is normal. There is normal pulmonary artery systolic pressure.  3. Left atrial size was moderately dilated.  4. Moderate vegetation on the mitral valve.  5. Vegetation noted on the anterior mitral valve leaflet. Recommend TEE to better evaluate.. The mitral valve is normal in structure and function. Trivial mitral valve regurgitation. No evidence of mitral stenosis.  6. Abnromal thickening of the right and non-coronary cusps concerning for endocarditis. Recommend TEE to better evaluate.. The aortic valve is abnormal. Aortic valve regurgitation is mild to moderate. No aortic stenosis is present.  7. Aortic valve vegetation is visualized on the right and noncoronary.  8. The inferior vena cava is normal in size with greater than 50% respiratory variability, suggesting right atrial pressure of 3 mmHg. FINDINGS  Left Ventricle: Left ventricular ejection fraction, by estimation, is 60 to 65%. The left ventricle has normal function. The left ventricle has no regional wall motion abnormalities. The left ventricular internal cavity size was normal in size. There is  moderate concentric left ventricular hypertrophy. Left ventricular diastolic parameters were normal. Right Ventricle: The right ventricular size is normal. No increase in right ventricular wall thickness. Right ventricular systolic function is normal. There is normal pulmonary artery systolic pressure. The tricuspid regurgitant velocity is 1.47 m/s, and  with an assumed right atrial pressure of 3 mmHg, the estimated right ventricular systolic pressure is 123XX123 mmHg. Left Atrium: Left atrial size was moderately dilated. Right Atrium: Right atrial size was normal in size. Pericardium: There is no evidence of pericardial effusion. Mitral Valve: Vegetation noted on the anterior mitral valve  leaflet. Recommend TEE to better evaluate. The mitral valve is normal in structure and function. Normal mobility of the mitral valve leaflets. A moderate vegetation is seen on the anterior mitral leaflet. The MV vegetation measures 18 mm x 14 mm. Trivial mitral valve regurgitation. No evidence of mitral valve stenosis. MV peak gradient, 5.4 mmHg. The mean mitral valve gradient is 3.0 mmHg. Tricuspid Valve: Cannot rule out tricuspid valve vegetation. The tricuspid valve is normal in structure. Tricuspid valve regurgitation is mild . No evidence of tricuspid stenosis. Aortic Valve: Abnromal thickening of the right and non-coronary cusps concerning for endocarditis. Recommend TEE to better evaluate. The aortic valve is abnormal. Aortic valve regurgitation is mild to moderate. Aortic regurgitation PHT measures 229 msec.  No aortic stenosis is present. Aortic valve mean gradient measures 6.0 mmHg. Aortic valve peak gradient measures 13.2 mmHg. Aortic valve area, by VTI measures 2.54 cm. A vegetation is seen on the right and noncoronary. Pulmonic Valve: The pulmonic valve was normal in structure. Pulmonic valve regurgitation is not visualized. No evidence of pulmonic stenosis. Aorta: The aortic root is normal in size and structure. Venous: The inferior vena cava is normal in size with greater than 50% respiratory variability, suggesting right atrial pressure of 3 mmHg. IAS/Shunts: No atrial level shunt detected by color flow Doppler.  LEFT VENTRICLE PLAX 2D LVIDd:         3.90 cm  Diastology LVIDs:         2.55 cm  LV e' lateral:   9.46 cm/s LV PW:         1.40 cm  LV E/e'  lateral: 11.7 LV IVS:        1.50 cm  LV e' medial:    10.70 cm/s LVOT diam:     2.00 cm  LV E/e' medial:  10.4 LV SV:         64.72 ml LV SV Index:   33.04 LVOT Area:     3.14 cm  RIGHT VENTRICLE             IVC RV Basal diam:  3.20 cm     IVC diam: 1.50 cm RV S prime:     17.00 cm/s TAPSE (M-mode): 1.3 cm LEFT ATRIUM             Index       RIGHT  ATRIUM          Index LA diam:        2.70 cm 1.38 cm/m  RA Area:     9.75 cm LA Vol (A2C):   49.7 ml 25.38 ml/m RA Volume:   15.80 ml 8.07 ml/m LA Vol (A4C):   66.8 ml 34.11 ml/m LA Biplane Vol: 58.3 ml 29.77 ml/m  AORTIC VALVE AV Area (Vmax):    2.45 cm AV Area (Vmean):   2.62 cm AV Area (VTI):     2.54 cm AV Vmax:           182.00 cm/s AV Vmean:          120.000 cm/s AV VTI:            0.255 m AV Peak Grad:      13.2 mmHg AV Mean Grad:      6.0 mmHg LVOT Vmax:         142.00 cm/s LVOT Vmean:        100.000 cm/s LVOT VTI:          0.206 m LVOT/AV VTI ratio: 0.81 AI PHT:            229 msec  AORTA Ao Root diam: 2.50 cm Ao Asc diam:  2.80 cm MITRAL VALVE                TRICUSPID VALVE MV Area (PHT): 5.84 cm     TR Peak grad:   8.6 mmHg MV Peak grad:  5.4 mmHg     TR Vmax:        147.00 cm/s MV Mean grad:  3.0 mmHg MV Vmax:       1.16 m/s     SHUNTS MV Vmean:      80.4 cm/s    Systemic VTI:  0.21 m MV Decel Time: 130 msec     Systemic Diam: 2.00 cm MV E velocity: 111.00 cm/s MV A velocity: 113.00 cm/s MV E/A ratio:  0.98 Skeet Latch MD Electronically signed by Skeet Latch MD Signature Date/Time: 05/10/2019/8:33:46 AM    Final    [place] EKG SITE RITE  Result Date: 05/10/2019 If Site Rite image not attached, placement could not be confirmed due to current cardiac rhythm.       Scheduled Meds: . amLODipine  5 mg Oral Daily  . atorvastatin  20 mg Oral Daily  . buprenorphine-naloxone  1 tablet Sublingual BID  . DULoxetine  30 mg Oral Daily  . enoxaparin (LOVENOX) injection  40 mg Subcutaneous Q24H  . gabapentin  400 mg Oral TID  . insulin aspart  0-15 Units Subcutaneous TID WC  . insulin aspart  3 Units Subcutaneous TID WC  . insulin glargine  30  Units Subcutaneous BID  . levothyroxine  50 mcg Oral Q0600  . pantoprazole  40 mg Oral Daily  . sertraline  50 mg Oral Daily   Continuous Infusions: . sodium chloride    . cefTRIAXone (ROCEPHIN)  IV 2 g (05/10/19 1532)     Time spent:  20 minutes with over 50% of the time coordinating the patient's care    Harold Hedge, DO [facility] Pager [phone]  Call night coverage person covering after 7pm

## 2019-05-11 NOTE — Progress Notes (Signed)
Right lower extremity venous duplex completed. Refer to "CV Proc" under chart review to view preliminary results.  05/11/2019 2:55 PM Kelby Aline., MHA, RVT, RDCS, RDMS

## 2019-05-11 NOTE — Progress Notes (Addendum)
Inpatient Diabetes Program Recommendations  AACE/ADA: New Consensus Statement on Inpatient Glycemic Control (2015)  Target Ranges:  Prepandial:   less than 140 mg/dL      Peak postprandial:   less than 180 mg/dL (1-2 hours)      Critically ill patients:  140 - 180 mg/dL   Lab Results  Component Value Date   GLUCAP 199 (H) 05/11/2019   HGBA1C 10.0 (H) 03/19/2019    Review of Glycemic Control Results for KONRAD, DESAI (MRN [id number]) as of 05/11/2019 12:02  Ref. Range 05/10/2019 07:32 05/10/2019 12:14 05/10/2019 16:52 05/10/2019 21:47 05/11/2019 07:37  Glucose-Capillary Latest Ref Range: 70 - 99 mg/dL 93 169 (H) 341 (H) 125 (H) 199 (H)   Diabetes history:  DM 1 Outpatient Diabetes medications:  Lantus 40 units bid, Humalog 4-20 units tid with meals Current orders for Inpatient glycemic control:  Lantus 35 units bid,  Novolog moderate tid with meals, Novolog 3 units tid with meals Inpatient Diabetes Program Recommendations:   Consider reducing Lantus to 28 units bid (note low fasting CBG's on 2/20 and 05/10/19). Consider instead increasing Novolog meal coverage to 6 units tid with meals (to cover CHO intake).   Of note, A1C is actually better then it was last admission.  Will discuss further with patient today.   Thanks  Adah Perl, RN, BC-ADM Inpatient Diabetes Coordinator Pager [phone]   2/22- Addendum:  Spoke with patient regarding A1C of 10%.  He states that his is actually really good for him.  Reminded him of importance of glycemic control.  He states he will be here for 6 weeks so his blood sugars should improve.

## 2019-05-12 ENCOUNTER — Encounter (HOSPITAL_COMMUNITY)
Admission: EM | Discharge status: Against Medical Advice | Payer: Self-pay | Source: Home / Self Care | Attending: Internal Medicine

## 2019-05-12 ENCOUNTER — Inpatient Hospital Stay (HOSPITAL_COMMUNITY): Payer: Commercial Managed Care - PPO | Admitting: Anesthesiology

## 2019-05-12 ENCOUNTER — Inpatient Hospital Stay: Payer: Self-pay

## 2019-05-12 ENCOUNTER — Encounter (HOSPITAL_COMMUNITY): Payer: Self-pay | Admitting: Internal Medicine

## 2019-05-12 ENCOUNTER — Inpatient Hospital Stay (HOSPITAL_COMMUNITY): Payer: Commercial Managed Care - PPO

## 2019-05-12 DIAGNOSIS — I330 Acute and subacute infective endocarditis: Secondary | ICD-10-CM

## 2019-05-12 DIAGNOSIS — I340 Nonrheumatic mitral (valve) insufficiency: Secondary | ICD-10-CM

## 2019-05-12 HISTORY — PX: TEE WITHOUT CARDIOVERSION: SHX5443

## 2019-05-12 LAB — BASIC METABOLIC PANEL
Anion gap: 11 (ref 5–15)
BUN: 17 mg/dL (ref 6–20)
CO2: 22 mmol/L (ref 22–32)
Calcium: 8.7 mg/dL — ABNORMAL LOW (ref 8.9–10.3)
Chloride: 102 mmol/L (ref 98–111)
Creatinine, Ser: 1.19 mg/dL (ref 0.61–1.24)
GFR calc Af Amer: >60 mL/min (ref >60)
GFR calc non Af Amer: >60 mL/min (ref >60)
Glucose, Bld: 141 mg/dL — ABNORMAL HIGH (ref 70–99)
Potassium: 4.1 mmol/L (ref 3.5–5.1)
Sodium: 135 mmol/L (ref 135–145)

## 2019-05-12 LAB — GLUCOSE, CAPILLARY
Glucose-Capillary: 214 mg/dL — ABNORMAL HIGH (ref 70–99)
Glucose-Capillary: 371 mg/dL — ABNORMAL HIGH (ref 70–99)
Glucose-Capillary: 512 mg/dL (ref 70–99)
Glucose-Capillary: 89 mg/dL (ref 70–99)

## 2019-05-12 LAB — CBC
HCT: 31.3 % — ABNORMAL LOW (ref 39.0–52.0)
Hemoglobin: 10 g/dL — ABNORMAL LOW (ref 13.0–17.0)
MCH: 23.4 pg — ABNORMAL LOW (ref 26.0–34.0)
MCHC: 31.9 g/dL (ref 30.0–36.0)
MCV: 73.1 fL — ABNORMAL LOW (ref 80.0–100.0)
Platelets: 534 10*3/uL — ABNORMAL HIGH (ref 150–400)
RBC: 4.28 MIL/uL (ref 4.22–5.81)
RDW: 13.8 % (ref 11.5–15.5)
WBC: 16.5 10*3/uL — ABNORMAL HIGH (ref 4.0–10.5)
nRBC: 0 % (ref 0.0–0.2)

## 2019-05-12 SURGERY — ECHOCARDIOGRAM, TRANSESOPHAGEAL
Anesthesia: Monitor Anesthesia Care

## 2019-05-12 MED ORDER — SODIUM CHLORIDE 0.9% FLUSH: 10.0000 mL | INTRAVENOUS | Status: DC | PRN

## 2019-05-12 MED ORDER — SODIUM CHLORIDE 0.9% FLUSH
10.0000 mL | Freq: Two times a day (BID) | INTRAVENOUS | Status: DC
  Administered 2019-05-13 – 2019-05-16 (×4): 10 mL

## 2019-05-12 MED ORDER — DEXMEDETOMIDINE HCL 200 MCG/2ML IV SOLN
INTRAVENOUS | Status: DC | PRN
  Administered 2019-05-12: 8 ug via INTRAVENOUS

## 2019-05-12 MED ORDER — CHLORHEXIDINE GLUCONATE CLOTH 2 % EX PADS
6.0000 | MEDICATED_PAD | Freq: Every day | CUTANEOUS | Status: DC
  Administered 2019-05-12 – 2019-05-16 (×5): 6 via TOPICAL

## 2019-05-12 MED ORDER — PROPOFOL 500 MG/50ML IV EMUL
INTRAVENOUS | Status: DC | PRN
  Administered 2019-05-12: 175 ug/kg/min via INTRAVENOUS

## 2019-05-12 MED ORDER — INSULIN ASPART 100 UNIT/ML ~~LOC~~ SOLN
16.0000 [IU] | Freq: Once | SUBCUTANEOUS | Status: AC
  Administered 2019-05-12: 16 [IU] via SUBCUTANEOUS

## 2019-05-12 MED ORDER — TRAZODONE HCL 50 MG PO TABS
50.0000 mg | ORAL_TABLET | Freq: Every evening | ORAL | Status: DC | PRN
  Administered 2019-05-12 – 2019-05-14 (×3): 50 mg via ORAL
  Filled 2019-05-12 (×3): qty 1

## 2019-05-12 MED ORDER — LIDOCAINE 2% (20 MG/ML) 5 ML SYRINGE
INTRAMUSCULAR | Status: DC | PRN
  Administered 2019-05-12: 40 mg via INTRAVENOUS

## 2019-05-12 NOTE — Progress Notes (Signed)
PROGRESS NOTE    Luke [name]    Code Status: Full Code  TW:[id number] DOB: 21-Mar-1980 DOA: 05/08/2019 LOS: 3 days  PCP: Gildardo Pounds, NP CC:  Chief Complaint  Patient presents with  . Wound Infection       Hospital Summary  Luke [name] is a 39 y.o. 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 Had recent positive strep viridans blood cultures on 12/31 during recent admission but patient left AMA. Lost to follow up until he initiated a telehealth visit for recurrent skin infections on 2/1, then on 2/14 was notified that he had positive blood cultures from 12/31 and was told to go to the ER. Began having chest pain and shortness of breath prompting his admission on 2/20. started on empiric vancomycin/cefepime.     2/21 TTE showed AV/MV vegetations, infectious disease and cardiology consulted. 2/23: TEE  A & P   Principal Problem:   Sepsis ([place]) Active Problems:   Drug abuse, IV ([place])   Essential hypertension   CKD (chronic kidney disease) stage 3, GFR 30-59 ml/min   Cellulitis   Diabetic polyneuropathy associated with type 1 diabetes mellitus ([name])   Hepatitis C   Hypothyroidism   Heroin abuse ([place])   Acute bacterial endocarditis   1. Sepsis secondary to 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, in setting of IVDU sepsis resolved a. Hx of strep viridans bacteremia diagnosed 03/19/19, left AMA and was not treated b. WBC slight increase 16.4->16.8, down from 20 c. ID gave oritavancin x1 and changed to ceftriaxone d. TEE today e. Appreciate further ID recommendations 2. Bilateral upper extremity wounds present on admission, likely secondary to IVDU a. Leading to difficulty of IV access b. Wound care 3. DM1 with episode of hypoglycemia a. No recurrent hypoglycemia b. HA1c in December 10.0 c. Continue  current regimen d. Diabetic coordinator 4. AKI on CKD 3 a. currently 2.03 on admission, currently at baseline b. Holding lisinopril c. Continue to monitor 5. Hypothyroidism stable 6. HTN a. Continue amlodipine b. Holding Lisinopril 7. Right lower extremity calf tenderness/swelling a. RLE [place]: No DVT, bilateral groin lymphadenopathy noted b. I did not assess lymph nodes today.  Assess in a.m. 8. IVDU a. Wishes to quit b. Started on Suboxone this admission c. Follow up with SW 9. Microcytic anemia, stable  DVT prophylaxis: Lovenox Family Communication: Wife works until 7 PM, patient updates wife himself Disposition Plan:   Patient came from:  home                                                                                          Anticipated d/c place: home  Barriers to d/c: medical stability, IV antibiotics. Will likely need to remain inpatient for IV antibiotic duration due to heroin abuse  Pressure injury documentation    None  Consultants  ID Cardiology  Procedures  None  Antibiotics   Anti-infectives (From admission, onward)   Start     Dose/Rate Route Frequency Ordered Stop  05/12/19 0500  [MAR Hold]  penicillin G potassium 12 Million Units in dextrose 5 % 500 mL continuous infusion     (MAR Hold since Tue 05/12/2019 at 1116.Hold Reason: Transfer to a Procedural area.)   12 Million Units 41.7 mL/hr over 12 Hours Intravenous Every 12 hours 05/11/19 1604     05/11/19 1700  [MAR Hold]  gentamicin (GARAMYCIN) 210 mg in dextrose 5 % 50 mL IVPB     (MAR Hold since Tue 05/12/2019 at 1116.Hold Reason: Transfer to a Procedural area.)   3 mg/kg  70.7 kg (Ideal) 110.5 mL/hr over 30 Minutes Intravenous Every 24 hours 05/11/19 1618     05/10/19 1400  cefTRIAXone (ROCEPHIN) 2 g in sodium chloride 0.9 % 100 mL IVPB  Status:  Discontinued     2 g 200 mL/hr over 30 Minutes Intravenous Every 24 hours 05/10/19 1259 05/11/19 1604   05/10/19 1330  Oritavancin Diphosphate  (ORBACTIV) 1,200 mg in dextrose 5 % IVPB     1,200 mg 333.3 mL/hr over 180 Minutes Intravenous Once 05/10/19 1317 05/10/19 1935   05/10/19 0845  ceFEPIme (MAXIPIME) 2 g in sodium chloride 0.9 % 100 mL IVPB  Status:  Discontinued     2 g 200 mL/hr over 30 Minutes Intravenous Every 8 hours 05/10/19 0836 05/10/19 1259   05/10/19 0000  vancomycin (VANCOREADY) IVPB 1250 mg/250 mL  Status:  Discontinued     1,250 mg 166.7 mL/hr over 90 Minutes Intravenous Every 24 hours 05/09/19 0031 05/10/19 1317   05/09/19 2200  ceFEPIme (MAXIPIME) 2 g in sodium chloride 0.9 % 100 mL IVPB  Status:  Discontinued     2 g 200 mL/hr over 30 Minutes Intravenous Every 12 hours 05/09/19 1041 05/10/19 0836   05/09/19 0800  ceFEPIme (MAXIPIME) 2 g in sodium chloride 0.9 % 100 mL IVPB  Status:  Discontinued     2 g 200 mL/hr over 30 Minutes Intravenous Every 8 hours 05/09/19 0012 05/09/19 1041   05/09/19 0015  ceFEPIme (MAXIPIME) 2 g in sodium chloride 0.9 % 100 mL IVPB     2 g 200 mL/hr over 30 Minutes Intravenous  Once 05/09/19 0004 05/09/19 0130   05/09/19 0015  metroNIDAZOLE (FLAGYL) IVPB 500 mg     500 mg 100 mL/hr over 60 Minutes Intravenous  Once 05/09/19 0004 05/09/19 0150   05/09/19 0015  vancomycin (VANCOCIN) IVPB 1000 mg/200 mL premix  Status:  Discontinued     1,000 mg 200 mL/hr over 60 Minutes Intravenous  Once 05/09/19 0004 05/09/19 0010   05/09/19 0015  vancomycin (VANCOREADY) IVPB 1500 mg/300 mL     1,500 mg 150 mL/hr over 120 Minutes Intravenous  Once 05/09/19 0010 05/09/19 0351        Subjective   No acute events overnight, no complaints.  Objective   Vitals:   05/12/19 1118 05/12/19 1312 05/12/19 1322 05/12/19 1331  BP:  111/69 119/77   Pulse:  79 75 79  Resp:  (!) 21 19 20   Temp:  97.9 F (36.6 C)    TempSrc:  Axillary    SpO2:  100% 100% 100%  Weight: 81.6 kg     Height: 5\' 9"  (1.753 m)       Intake/Output Summary (Last 24 hours) at 05/12/2019 1353 Last data filed at  05/12/2019 1314 Gross per 24 hour  Intake 804.44 ml  Output 500 ml  Net 304.44 ml   Filed Weights   05/09/19 0400 05/12/19 1118  Weight: 80 kg  81.6 kg    Examination:  Physical Exam Vitals and nursing note reviewed.  Constitutional:      Appearance: Normal appearance.  HENT:     Head: Normocephalic and atraumatic.  Eyes:     Conjunctiva/sclera: Conjunctivae normal.  Cardiovascular:     Rate and Rhythm: Normal rate and regular rhythm.  Pulmonary:     Effort: Pulmonary effort is normal.     Breath sounds: Normal breath sounds.  Abdominal:     General: Abdomen is flat.     Palpations: Abdomen is soft.  Musculoskeletal:        General: No swelling or tenderness.     Comments: Bilateral upper extremity wounds similar to prior  Skin:    Coloration: Skin is not jaundiced or pale.  Neurological:     Mental Status: He is alert. Mental status is at baseline.  Psychiatric:        Mood and Affect: Mood normal.        Behavior: Behavior normal.     Data Reviewed: I have personally reviewed following labs and imaging studies  CBC: Recent Labs  Lab 05/08/19 2210 05/09/19 0634 05/10/19 0650 05/11/19 0321 05/12/19 0130  WBC 20.1* 18.2* 16.4* 16.8* 16.5*  NEUTROABS 13.7*  --   --   --   --   HGB 10.1* 9.3* 10.7* 10.4* 10.0*  HCT 32.0* 30.3* 33.9* 32.1* 31.3*  MCV 75.3* 76.5* 75.0* 72.3* 73.1*  PLT 592* 495* 520* 532* [id number]*   Basic Metabolic Panel: Recent Labs  Lab 05/08/19 2210 05/09/19 0136 05/10/19 0650 05/11/19 0321 05/12/19 0130  NA 132* 133* 134* 131* 135  K 3.6 3.5 4.0 3.9 4.1  CL 95* 98 102 98 102  CO2 22 22 22 23 22   GLUCOSE 272* 148* 49* 256* 141*  BUN 27* 26* 12 12 17   CREATININE 2.03* 1.79* 1.15 1.23 1.19  CALCIUM 8.9 8.7* 8.9 8.7* 8.7*   GFR: Estimated Creatinine Clearance: 83.3 mL/min (by C-G formula based on SCr of 1.19 mg/dL). Liver Function Tests: Recent Labs  Lab 05/08/19 2210  AST 21  ALT 17  ALKPHOS 136*  BILITOT 0.4  PROT 7.7    ALBUMIN 2.9*   No results for input(s): LIPASE, AMYLASE in the last 168 hours. No results for input(s): AMMONIA in the last 168 hours. Coagulation Profile: Recent Labs  Lab 05/08/19 2210  INR 1.0   Cardiac Enzymes: No results for input(s): CKTOTAL, CKMB, CKMBINDEX, TROPONINI in the last 168 hours. BNP (last 3 results) No results for input(s): PROBNP in the last 8760 hours. HbA1C: No results for input(s): HGBA1C in the last 72 hours. CBG: Recent Labs  Lab 05/11/19 0737 05/11/19 1230 05/11/19 1658 05/11/19 2113 05/12/19 0746  GLUCAP 199* 215* 267* 93 89   Lipid Profile: No results for input(s): CHOL, HDL, LDLCALC, TRIG, CHOLHDL, LDLDIRECT in the last 72 hours. Thyroid Function Tests: No results for input(s): TSH, T4TOTAL, FREET4, T3FREE, THYROIDAB in the last 72 hours. Anemia Panel: No results for input(s): VITAMINB12, FOLATE, FERRITIN, TIBC, IRON, RETICCTPCT in the last 72 hours. Sepsis Labs: Recent Labs  Lab 05/08/19 2211 05/09/19 0040  LATICACIDVEN 2.2* 1.6    Recent Results (from the past 240 hour(s))  Culture, blood (Routine x 2)     Status: Abnormal   Collection Time: 05/08/19 10:00 PM   Specimen: BLOOD RIGHT HAND  Result Value Ref Range Status   Specimen Description BLOOD RIGHT HAND  Final   Special Requests   Final  BOTTLES DRAWN AEROBIC ONLY Blood Culture adequate volume   Culture  Setup Time   Final    AEROBIC BOTTLE ONLY GRAM POSITIVE COCCI CRITICAL RESULT CALLED TO, READ BACK BY AND VERIFIED WITH: PHRMD FEAY LORA AT [id number] BY MESSAN HOUEGNIFIO ON 05/10/2019 Performed at [facility], [address], [place], [place] [address]    Culture STREPTOCOCCUS MITIS/ORALIS (A)  Final   Report Status 05/11/2019 FINAL  Final   Organism ID, Bacteria STREPTOCOCCUS MITIS/ORALIS  Final      Susceptibility   Streptococcus mitis/oralis - MIC*    TETRACYCLINE 0.5 SENSITIVE Sensitive     VANCOMYCIN 0.5 SENSITIVE Sensitive     CLINDAMYCIN <=0.25 SENSITIVE  Sensitive     PENICILLIN Value in next row Sensitive      SENSITIVE<=0.06    CEFTRIAXONE Value in next row Sensitive      SENSITIVE<=0.12    * STREPTOCOCCUS MITIS/ORALIS  Blood Culture ID Panel (Reflexed)     Status: Abnormal   Collection Time: 05/08/19 10:00 PM  Result Value Ref Range Status   Enterococcus species NOT DETECTED NOT DETECTED Final   Listeria monocytogenes NOT DETECTED NOT DETECTED Final   Staphylococcus species NOT DETECTED NOT DETECTED Final   Staphylococcus aureus (BCID) NOT DETECTED NOT DETECTED Final   Streptococcus species DETECTED (A) NOT DETECTED Final    Comment: Not Enterococcus species, Streptococcus agalactiae, Streptococcus pyogenes, or Streptococcus pneumoniae. CRITICAL RESULT CALLED TO, READ BACK BY AND VERIFIED WITH: PHRMD FEAY LORA AT [id number] BY [place] ON 05/10/2019    Streptococcus agalactiae NOT DETECTED NOT DETECTED Final   Streptococcus pneumoniae NOT DETECTED NOT DETECTED Final   Streptococcus pyogenes NOT DETECTED NOT DETECTED Final   Acinetobacter baumannii NOT DETECTED NOT DETECTED Final   Enterobacteriaceae species NOT DETECTED NOT DETECTED Final   Enterobacter cloacae complex NOT DETECTED NOT DETECTED Final   Escherichia coli NOT DETECTED NOT DETECTED Final   Klebsiella oxytoca NOT DETECTED NOT DETECTED Final   Klebsiella pneumoniae NOT DETECTED NOT DETECTED Final   Proteus species NOT DETECTED NOT DETECTED Final   Serratia marcescens NOT DETECTED NOT DETECTED Final   Haemophilus influenzae NOT DETECTED NOT DETECTED Final   Neisseria meningitidis NOT DETECTED NOT DETECTED Final   Pseudomonas aeruginosa NOT DETECTED NOT DETECTED Final   Candida albicans NOT DETECTED NOT DETECTED Final   Candida glabrata NOT DETECTED NOT DETECTED Final   Candida krusei NOT DETECTED NOT DETECTED Final   Candida parapsilosis NOT DETECTED NOT DETECTED Final   Candida tropicalis NOT DETECTED NOT DETECTED Final    Comment: Performed at [facility], [place] [address] [place], [place] [address]  Culture, blood (Routine x 2)     Status: Abnormal   Collection Time: 05/08/19 10:12 PM   Specimen: BLOOD RIGHT ARM  Result Value Ref Range Status   Specimen Description BLOOD RIGHT ARM  Final   Special Requests   Final    BOTTLES DRAWN AEROBIC AND ANAEROBIC Blood Culture adequate volume   Culture  Setup Time   Final    GRAM POSITIVE COCCI IN BOTH AEROBIC AND ANAEROBIC BOTTLES CRITICAL RESULT CALLED TO, READ BACK BY AND VERIFIED WITH: PHARMD JESSICA BARNEY @1838  05/09/19 AKT    Culture (A)  Final    STREPTOCOCCUS MITIS/ORALIS SUSCEPTIBILITIES PERFORMED ON PREVIOUS CULTURE WITHIN THE LAST 5 DAYS. Performed at [facility], [place] [address], [place], [place] [address]    Report Status 05/11/2019 FINAL  Final  Urine culture  Status: None   Collection Time: 05/09/19 12:03 AM   Specimen: In/Out Cath Urine  Result Value Ref Range Status   Specimen Description IN/OUT CATH URINE  Final   Special Requests NONE  Final   Culture   Final    NO GROWTH Performed at [facility], [place] [address], [place], [place] [address]    Report Status 05/10/2019 FINAL  Final  SARS CORONAVIRUS 2 (TAT 6-24 HRS) Nasopharyngeal Nasopharyngeal Swab     Status: None   Collection Time: 05/09/19  1:24 AM   Specimen: Nasopharyngeal Swab  Result Value Ref Range Status   SARS Coronavirus 2 NEGATIVE NEGATIVE Final    Comment: (NOTE) SARS-CoV-2 target nucleic acids are NOT DETECTED. The SARS-CoV-2 RNA is generally detectable in upper and lower respiratory specimens during the acute phase of infection. Negative results do not preclude SARS-CoV-2 infection, do not rule out co-infections with other pathogens, and should not be used as the sole basis for treatment or other patient management decisions. Negative results must be combined with clinical observations, patient history, and epidemiological information. The expected result is Negative. Fact Sheet for  Patients: SugarRoll.be Fact Sheet for Healthcare Providers: https://www.woods-mathews.com/ This test is not yet approved or cleared by the [place] FDA and  has been authorized for detection and/or diagnosis of SARS-CoV-2 by FDA under an Emergency Use Authorization (EUA). This EUA will remain  in effect (meaning this test can be used) for the duration of the COVID-19 declaration under Section 56 4(b)(1) of the Act, 21 U.S.C. section 360bbb-3(b)(1), unless the authorization is terminated or revoked sooner. Performed at [facility], [place] [address], [place], [place] [address]   Culture, blood (Routine X 2) w Reflex to ID Panel     Status: None (Preliminary result)   Collection Time: 05/10/19  3:22 PM   Specimen: BLOOD  Result Value Ref Range Status   Specimen Description BLOOD LEFT ARM  Final   Special Requests   Final    BOTTLES DRAWN AEROBIC ONLY Blood Culture adequate volume   Culture   Final    NO GROWTH 2 DAYS Performed at [facility], [place] [address] [place], [place] [address]    Report Status PENDING  Incomplete  Culture, blood (Routine X 2) w Reflex to ID Panel     Status: None (Preliminary result)   Collection Time: 05/10/19  3:29 PM   Specimen: BLOOD  Result Value Ref Range Status   Specimen Description BLOOD LEFT ARM  Final   Special Requests   Final    BOTTLES DRAWN AEROBIC ONLY Blood Culture adequate volume   Culture   Final    NO GROWTH 2 DAYS Performed at [facility], [address] [place], [place] [address]    Report Status PENDING  Incomplete  MRSA PCR Screening     Status: None   Collection Time: 05/11/19  5:25 PM   Specimen: Nasal Mucosa; Nasopharyngeal  Result Value Ref Range Status   MRSA by PCR NEGATIVE NEGATIVE Final    Comment:        The GeneXpert MRSA Assay (FDA approved for NASAL specimens only), is one component of a comprehensive MRSA colonization surveillance program. It is  not intended to diagnose MRSA infection nor to guide or monitor treatment for MRSA infections. Performed at [facility], [place] [address] [place], [place] [address]          Radiology Studies: VAS [place] LOWER EXTREMITY  VENOUS (DVT)  Result Date: 05/12/2019  Lower Venous DVTStudy Indications: Pain, and Swelling. Other Indications: IVDU. Comparison Study: No prior study Performing Technologist: Maudry Mayhew MHA, RDMS, RVT, RDCS  Examination Guidelines: A complete evaluation includes B-mode imaging, spectral Doppler, color Doppler, and power Doppler as needed of all accessible portions of each vessel. Bilateral testing is considered an integral part of a complete examination. Limited examinations for reoccurring indications may be performed as noted. The reflux portion of the exam is performed with the patient in reverse Trendelenburg.  +---------+---------------+---------+-----------+----------+--------------+ RIGHT    CompressibilityPhasicitySpontaneityPropertiesThrombus Aging +---------+---------------+---------+-----------+----------+--------------+ CFV      Full           Yes      Yes                                 +---------+---------------+---------+-----------+----------+--------------+ SFJ      Full                                                        +---------+---------------+---------+-----------+----------+--------------+ FV Prox  Full                                                        +---------+---------------+---------+-----------+----------+--------------+ FV Mid   Full                                                        +---------+---------------+---------+-----------+----------+--------------+ FV DistalFull                                                        +---------+---------------+---------+-----------+----------+--------------+ PFV      Full                                                         +---------+---------------+---------+-----------+----------+--------------+ POP      Full           Yes      Yes                                 +---------+---------------+---------+-----------+----------+--------------+ PTV      Full                                                        +---------+---------------+---------+-----------+----------+--------------+ PERO     Full                                                        +---------+---------------+---------+-----------+----------+--------------+  Gastroc  Full                    Yes                                 +---------+---------------+---------+-----------+----------+--------------+   +----+---------------+---------+-----------+----------+--------------+ LEFTCompressibilityPhasicitySpontaneityPropertiesThrombus Aging +----+---------------+---------+-----------+----------+--------------+ CFV Full           Yes      Yes                                 +----+---------------+---------+-----------+----------+--------------+     Summary: RIGHT: - There is no evidence of deep vein thrombosis in the lower extremity.  - No cystic structure found in the popliteal fossa. - Ultrasound characteristics of enlarged lymph nodes are noted in the groin.  LEFT: - No evidence of common femoral vein obstruction. - Ultrasound characteristics of enlarged lymph nodes noted in the groin.  *See table(s) above for measurements and observations. Electronically signed by Ruta Hinds MD on 05/12/2019 at 11:49:21 AM.    Final    [place] EKG SITE RITE  Result Date: 05/12/2019 If [facility] image not attached, placement could not be confirmed due to current cardiac rhythm.       Scheduled Meds: . [MAR Hold] amLODipine  5 mg Oral Daily  . [MAR Hold] atorvastatin  20 mg Oral Daily  . [MAR Hold] buprenorphine-naloxone  1 tablet Sublingual BID  . [MAR Hold] DULoxetine  30 mg Oral Daily  . [MAR Hold] enoxaparin (LOVENOX) injection  40  mg Subcutaneous Q24H  . [MAR Hold] gabapentin  400 mg Oral TID  . [MAR Hold] insulin aspart  0-15 Units Subcutaneous TID WC  . [MAR Hold] insulin aspart  6 Units Subcutaneous TID WC  . [MAR Hold] insulin glargine  28 Units Subcutaneous BID  . [MAR Hold] levothyroxine  50 mcg Oral Q0600  . [MAR Hold] pantoprazole  40 mg Oral Daily  . [MAR Hold] sertraline  50 mg Oral Daily   Continuous Infusions: . sodium chloride 20 mL/hr at 05/12/19 1122  . [MAR Hold] gentamicin Stopped (05/11/19 1900)  . [MAR Hold] penicillin g continuous IV infusion 12 Million Units (05/12/19 0541)     Time spent: 19 minutes with over 50% of the time coordinating the patient's care    Harold Hedge, DO [facility] Pager [phone]  Call night coverage person covering after 7pm

## 2019-05-12 NOTE — Transfer of Care (Signed)
Immediate Anesthesia Transfer of Care Note  [name]  Procedure(s) Performed: TRANSESOPHAGEAL ECHOCARDIOGRAM (TEE) (N/A )  Patient Location: PACU and Endoscopy Unit  Anesthesia Type:MAC  Level of Consciousness: responds to stimulation  Airway & Oxygen Therapy: Patient Spontanous Breathing and Patient connected to nasal cannula oxygen  Post-op Assessment: Report given to RN and Post -op Vital signs reviewed and stable  Post vital signs: Reviewed and stable  Last Vitals:  Vitals Value Taken Time  BP 111/69 05/12/19 1312  Temp    Pulse 79 05/12/19 1313  Resp 21 05/12/19 1313  SpO2 100 % 05/12/19 1313  Vitals shown include unvalidated device data.  Last Pain:  Vitals:   05/12/19 1312  TempSrc:   PainSc: Asleep      Patients Stated Pain Goal: 2 (03/27/30 3557)  Complications: No apparent anesthesia complications

## 2019-05-12 NOTE — Plan of Care (Signed)

## 2019-05-12 NOTE — Progress Notes (Signed)
[facility] for Infectious Disease    Date of Admission:  05/08/2019   Total days of antibiotics 4          ID: Luke [name] is a 39 y.o. 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 Denies fever. He states he had significant DOE after taking 10 steps which brought him into the hospital. Symptoms slightly improved though has not ambulated much.   ROS: 12 point ros is negative. No fever, chills, nightsweats, +difficulty with blood draws  Medications:  . [MAR Hold] amLODipine  5 mg Oral Daily  . [MAR Hold] atorvastatin  20 mg Oral Daily  . [MAR Hold] buprenorphine-naloxone  1 tablet Sublingual BID  . [MAR Hold] DULoxetine  30 mg Oral Daily  . [MAR Hold] enoxaparin (LOVENOX) injection  40 mg Subcutaneous Q24H  . [MAR Hold] gabapentin  400 mg Oral TID  . [MAR Hold] insulin aspart  0-15 Units Subcutaneous TID WC  . [MAR Hold] insulin aspart  6 Units Subcutaneous TID WC  . [MAR Hold] insulin glargine  28 Units Subcutaneous BID  . [MAR Hold] levothyroxine  50 mcg Oral Q0600  . [MAR Hold] pantoprazole  40 mg Oral Daily  . [MAR Hold] sertraline  50 mg Oral Daily    Objective: Vital signs in last 24 hours: Temp:  [98.3 F (36.8 C)-99.3 F (37.4 C)] 98.3 F (36.8 C) (02/23 1117) Pulse Rate:  [79-114] 79 (02/23 1312) Resp:  [18-21] 21 (02/23 1312) BP: (111-163)/(69-99) 111/69 (02/23 1312) SpO2:  [100 %] 100 % (02/23 1312) Weight:  [81.6 kg] 81.6 kg (02/23 1118) Physical Exam  Constitutional: He is oriented to person, place, and  time. He appears well-developed and well-nourished. No distress.  HENT:  Mouth/Throat: Oropharynx is clear and moist. No oropharyngeal exudate.  Cardiovascular: Normal rate, regular rhythm and +systolic murmur Pulmonary/Chest: Effort normal and breath sounds normal. No respiratory distress. He has no wheezes.  Abdominal: Soft. Bowel sounds are normal. He exhibits no distension. There is no tenderness.  Lymphadenopathy:  He has no cervical adenopathy.  Ext: significant keloid scarring to ACs bilaterally Neurological: He is alert and oriented to person, place, and time.  Skin: Skin is warm and dry. No rash noted. No erythema.  Psychiatric: He has a normal mood and affect. His behavior is normal.     Lab Results Recent Labs    05/11/19 0321 05/12/19 0130  WBC 16.8* 16.5*  HGB 10.4* 10.0*  HCT 32.1* 31.3*  NA 131* 135  K 3.9 4.1  CL 98 102  CO2 23 22  BUN 12 17  CREATININE 1.23 1.19   Liver Panel No results for input(s): PROT, ALBUMIN, AST, ALT, ALKPHOS, BILITOT, BILIDIR, IBILI in the last 72 hours.  Microbiology: 2/21 blood cx NGTD at 24hr 2/19 blood cx + Organism ID, Bacteria STREPTOCOCCUS MITIS/ORALIS   Resulting Agency [facility]  Susceptibility   Streptococcus mitis/oralis    MIC    CEFTRIAXONE  Sensitive1    CLINDAMYCIN <=0.25  SENS... Sensitive    PENICILLIN  Sensitive2    TETRACYCLINE 0.5 SENSITIVE  Sensitive    VANCOMYCIN 0.5 SENSITIVE  Sensitive       Results: VAS [place] LOWER EXTREMITY VENOUS (DVT)  Result Date: 05/12/2019  Lower Venous DVTStudy Indications: Pain, and Swelling. Other Indications: IVDU. Comparison Study: No prior study Performing Technologist: Maudry Mayhew MHA, RDMS, RVT, RDCS  Examination Guidelines: A complete evaluation includes B-mode imaging, spectral Doppler, color Doppler, and power Doppler as needed of all accessible portions of each vessel. Bilateral testing is considered an integral part of a complete examination. Limited  examinations for reoccurring indications may be performed as noted. The reflux portion of the exam is performed with the patient in reverse Trendelenburg.  +---------+---------------+---------+-----------+----------+--------------+ RIGHT    CompressibilityPhasicitySpontaneityPropertiesThrombus Aging +---------+---------------+---------+-----------+----------+--------------+ CFV      Full           Yes      Yes                                 +---------+---------------+---------+-----------+----------+--------------+ SFJ      Full                                                        +---------+---------------+---------+-----------+----------+--------------+ FV Prox  Full                                                        +---------+---------------+---------+-----------+----------+--------------+ FV Mid   Full                                                        +---------+---------------+---------+-----------+----------+--------------+ FV DistalFull                                                        +---------+---------------+---------+-----------+----------+--------------+ PFV      Full                                                        +---------+---------------+---------+-----------+----------+--------------+ POP      Full           Yes      Yes                                 +---------+---------------+---------+-----------+----------+--------------+ PTV      Full                                                        +---------+---------------+---------+-----------+----------+--------------+  PERO     Full                                                        +---------+---------------+---------+-----------+----------+--------------+ Gastroc  Full                    Yes                                 +---------+---------------+---------+-----------+----------+--------------+    +----+---------------+---------+-----------+----------+--------------+ LEFTCompressibilityPhasicitySpontaneityPropertiesThrombus Aging +----+---------------+---------+-----------+----------+--------------+ CFV Full           Yes      Yes                                 +----+---------------+---------+-----------+----------+--------------+     Summary: RIGHT: - There is no evidence of deep vein thrombosis in the lower extremity.  - No cystic structure found in the popliteal fossa. - Ultrasound characteristics of enlarged lymph nodes are noted in the groin.  LEFT: - No evidence of common femoral vein obstruction. - Ultrasound characteristics of enlarged lymph nodes noted in the groin.  *See table(s) above for measurements and observations. Electronically signed by Ruta Hinds MD on 05/12/2019 at 11:49:21 AM.    Final    [place] EKG SITE RITE  Result Date: 05/12/2019 If [facility] image not attached, placement could not be confirmed due to current cardiac rhythm.    Assessment/Plan: Streptococcal Native Mitral Valve Endocarditis = TEE reviewed and "large cyst-like structure on the A2 segment with mobile, filamentous structure on the P1 region on MV with moderate regurgitation (new since TEE 11-2017). AV has no evidence of endocarditis. Will continue with 2 weeks of treatment with Gentamicin + Penicillin to treat. Would ask TCTS to evaluate given large left sided involvement and comment on cyst-appearing material. With moderate mitral regurgitation would anticipate he would at least need cardiology follow up outpatient following treatment completion.   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 Seems comfortable and willing to stay.   AKI = appears improved. Will watch cr closely given he will be on gentamicin requiring therapeutic drug monitoring  Multiple skin lesions = current abtx can potentially also help  wound healing  Vascular Access = OK for PICC line now 48h from last blood cultures and no growth. With keloid scarring up arms he may require IR to place tunneled access. Will see what IV Team's assessment is.   Hepatitis C = Genotype 1a, with RNA level 10,500,000 copies on 11-2017. Doubt he has had treatment since. Will repeat RNA and offer follow up in clinic for consideration of treatment if he is interested. He could use Hep B vaccines also. Will check current hep b status also.    [name], MSN, NP-C [facility] for Infectious Disease [email or web address] Pager: [phone] Office: [phone] [place] Line: [phone]  05/12/2019, 1:15 PM

## 2019-05-12 NOTE — H&P (Signed)
Luke [name] is a 39 y.o. male who has presented today for surgery, with the diagnosis of endocarditis.  The various methods of treatment have been discussed with the patient and family. After consideration of risks, benefits and other options for treatment, the patient has consented to  Procedure(s): TRANSESOPHAGEAL ECHOCARDIOGRAM (TEE) (N/A) as a surgical intervention .  The patient's history has been reviewed, patient examined, no change in status, stable for surgery.  I have reviewed the patient's chart and labs.  Questions were answered to the patient's satisfaction.    Lasha Echeverria C. Oval Linsey, MD, [facility]  05/12/2019 12:45 PM

## 2019-05-12 NOTE — Anesthesia Postprocedure Evaluation (Signed)
Anesthesia Post Note  [name]  Procedure(s) Performed: TRANSESOPHAGEAL ECHOCARDIOGRAM (TEE) (N/A )     Patient location during evaluation: Endoscopy Anesthesia Type: MAC Level of consciousness: awake and alert Pain management: pain level controlled Vital Signs Assessment: post-procedure vital signs reviewed and stable Respiratory status: spontaneous breathing, nonlabored ventilation, respiratory function stable and patient connected to nasal cannula oxygen Cardiovascular status: blood pressure returned to baseline and stable Postop Assessment: no apparent nausea or vomiting Anesthetic complications: no    Last Vitals:  Vitals:   05/12/19 1425 05/12/19 1453  BP: (!) 127/103 (!) 154/98  Pulse: 99   Resp: 17   Temp: 36.9 C   SpO2: 100%     Last Pain:  Vitals:   05/12/19 1425  TempSrc: Oral  PainSc: 0-No pain                 Barnet Glasgow

## 2019-05-12 NOTE — Anesthesia Preprocedure Evaluation (Signed)
Anesthesia Evaluation  Patient identified by MRN, date of birth, ID band Patient awake    Reviewed: Allergy & Precautions, NPO status , Patient's Chart, lab work & pertinent test results  Airway Mallampati: II  TM Distance: >3 FB Neck ROM: Full    Dental no notable dental hx. (+) Teeth Intact, Dental Advisory Given   Pulmonary Current Smoker,    Pulmonary exam normal breath sounds clear to auscultation       Cardiovascular hypertension, Normal cardiovascular exam Rhythm:Regular Rate:Normal     Neuro/Psych  Headaches, negative psych ROS   GI/Hepatic (+)     substance abuse  IV drug use, Hepatitis -, C  Endo/Other  diabetesHypothyroidism   Renal/GU Renal diseaseCr 1.19     Musculoskeletal   Abdominal   Peds  Hematology  (+) anemia , Hgb 10.0   Anesthesia Other Findings   Reproductive/Obstetrics                             Anesthesia Physical Anesthesia Plan  ASA: III  Anesthesia Plan: MAC   Post-op Pain Management:    Induction: Intravenous  PONV Risk Score and Plan: Treatment may vary due to age or medical condition, Ondansetron and Dexamethasone  Airway Management Planned: Nasal Cannula and Natural Airway  Additional Equipment: None  Intra-op Plan:   Post-operative Plan:   Informed Consent: I have reviewed the patients History and Physical, chart, labs and discussed the procedure including the risks, benefits and alternatives for the proposed anesthesia with the patient or authorized representative who has indicated his/her understanding and acceptance.     Dental advisory given  Plan Discussed with: CRNA  Anesthesia Plan Comments: (TEE for ? Streptococcus endocarditis )        Anesthesia Quick Evaluation

## 2019-05-12 NOTE — Progress Notes (Signed)
Peripherally Inserted Central Catheter/Midline Placement  The IV Nurse has discussed with the patient and/or persons authorized to consent for the patient, the purpose of this procedure and the potential benefits and risks involved with this procedure.  The benefits include less needle sticks, lab draws from the catheter, and the patient may be discharged home with the catheter. Risks include, but not limited to, infection, bleeding, blood clot (thrombus formation), and puncture of an artery; nerve damage and irregular heartbeat and possibility to perform a PICC exchange if needed/ordered by physician.  Alternatives to this procedure were also discussed.  Bard Power PICC patient education guide, fact sheet on infection prevention and patient information card has been provided to patient /or left at bedside.    PICC/Midline Placement Documentation  PICC Single Lumen 05/12/19 PICC Right Brachial 38 cm 0 cm (Active)  Indication for Insertion or Continuance of Line Prolonged intravenous therapies 05/12/19 1716  Exposed Catheter (cm) 0 cm 05/12/19 1716  Site Assessment Clean;Dry;Intact 05/12/19 1716  Line Status Flushed;Saline locked;Blood return noted 05/12/19 1716  Dressing Type Transparent;Securing device 05/12/19 1716  Dressing Status Clean;Dry;Intact;Antimicrobial disc in place 05/12/19 1716  Dressing Change Due 05/19/19 05/12/19 1716       Lynne Logan 05/12/2019, 5:17 PM

## 2019-05-12 NOTE — CV Procedure (Addendum)
Brief TEE Note  LVEF 60-65% Large, cyst-like structure on the A2 segment and mobile, filamentous structure on the P1 region of the mitral valve. Findings are new compared to TEE 11/2017. Moderate mitral regurgitation.  No mitral stenosis. No evidence of endocarditis on the aortic valve. No LA/LAA thrombus or mass No ASD or PFO.  For additional details see full report.  Arlissa Monteverde C. Oval Linsey, MD, [facility] 05/12/2019 1:09 PM

## 2019-05-12 NOTE — Progress Notes (Signed)
  Echocardiogram Echocardiogram Transesophageal has been performed.  Luke [name] 05/12/2019, 1:17 PM

## 2019-05-13 ENCOUNTER — Inpatient Hospital Stay (HOSPITAL_COMMUNITY): Payer: Commercial Managed Care - PPO

## 2019-05-13 DIAGNOSIS — I340 Nonrheumatic mitral (valve) insufficiency: Secondary | ICD-10-CM

## 2019-05-13 LAB — BASIC METABOLIC PANEL
Anion gap: 9 (ref 5–15)
BUN: 16 mg/dL (ref 6–20)
CO2: 26 mmol/L (ref 22–32)
Calcium: 8.8 mg/dL — ABNORMAL LOW (ref 8.9–10.3)
Chloride: 103 mmol/L (ref 98–111)
Creatinine, Ser: 1.25 mg/dL — ABNORMAL HIGH (ref 0.61–1.24)
GFR calc Af Amer: >60 mL/min (ref >60)
GFR calc non Af Amer: >60 mL/min (ref >60)
Glucose, Bld: 85 mg/dL (ref 70–99)
Potassium: 3.8 mmol/L (ref 3.5–5.1)
Sodium: 138 mmol/L (ref 135–145)

## 2019-05-13 LAB — GLUCOSE, CAPILLARY
Glucose-Capillary: 276 mg/dL — ABNORMAL HIGH (ref 70–99)
Glucose-Capillary: 349 mg/dL — ABNORMAL HIGH (ref 70–99)
Glucose-Capillary: 403 mg/dL — ABNORMAL HIGH (ref 70–99)
Glucose-Capillary: 446 mg/dL — ABNORMAL HIGH (ref 70–99)
Glucose-Capillary: 453 mg/dL — ABNORMAL HIGH (ref 70–99)
Glucose-Capillary: 535 mg/dL (ref 70–99)
Glucose-Capillary: 61 mg/dL — ABNORMAL LOW (ref 70–99)
Glucose-Capillary: 74 mg/dL (ref 70–99)

## 2019-05-13 LAB — CBC
HCT: 32 % — ABNORMAL LOW (ref 39.0–52.0)
Hemoglobin: 9.9 g/dL — ABNORMAL LOW (ref 13.0–17.0)
MCH: 23.3 pg — ABNORMAL LOW (ref 26.0–34.0)
MCHC: 30.9 g/dL (ref 30.0–36.0)
MCV: 75.5 fL — ABNORMAL LOW (ref 80.0–100.0)
Platelets: 592 10*3/uL — ABNORMAL HIGH (ref 150–400)
RBC: 4.24 MIL/uL (ref 4.22–5.81)
RDW: 14 % (ref 11.5–15.5)
WBC: 16.5 10*3/uL — ABNORMAL HIGH (ref 4.0–10.5)
nRBC: 0 % (ref 0.0–0.2)

## 2019-05-13 LAB — HEPATITIS B SURFACE ANTIGEN: Hepatitis B Surface Ag: NONREACTIVE

## 2019-05-13 MED ORDER — INSULIN ASPART 100 UNIT/ML ~~LOC~~ SOLN
8.0000 [IU] | Freq: Three times a day (TID) | SUBCUTANEOUS | Status: DC
  Administered 2019-05-13 – 2019-05-14 (×4): 8 [IU] via SUBCUTANEOUS

## 2019-05-13 MED ORDER — INSULIN ASPART 100 UNIT/ML ~~LOC~~ SOLN
6.0000 [IU] | Freq: Once | SUBCUTANEOUS | Status: AC
  Administered 2019-05-13: 6 [IU] via SUBCUTANEOUS

## 2019-05-13 MED ORDER — INSULIN ASPART 100 UNIT/ML ~~LOC~~ SOLN
0.0000 [IU] | Freq: Three times a day (TID) | SUBCUTANEOUS | Status: DC
  Administered 2019-05-13: 7 [IU] via SUBCUTANEOUS
  Administered 2019-05-13: 19:00:00 5 [IU] via SUBCUTANEOUS
  Administered 2019-05-14: 18:00:00 7 [IU] via SUBCUTANEOUS
  Administered 2019-05-14: 2 [IU] via SUBCUTANEOUS
  Administered 2019-05-14 – 2019-05-15 (×2): 9 [IU] via SUBCUTANEOUS
  Administered 2019-05-15: 5 [IU] via SUBCUTANEOUS

## 2019-05-13 MED ORDER — INSULIN ASPART 100 UNIT/ML ~~LOC~~ SOLN: 0.0000 [IU] | Freq: Every day | SUBCUTANEOUS | Status: DC

## 2019-05-13 MED ORDER — INSULIN ASPART 100 UNIT/ML ~~LOC~~ SOLN
8.0000 [IU] | Freq: Once | SUBCUTANEOUS | Status: AC
  Administered 2019-05-13: 8 [IU] via SUBCUTANEOUS

## 2019-05-13 MED ORDER — INSULIN ASPART 100 UNIT/ML ~~LOC~~ SOLN
14.0000 [IU] | Freq: Once | SUBCUTANEOUS | Status: AC
  Administered 2019-05-13: 14 [IU] via SUBCUTANEOUS

## 2019-05-13 MED ORDER — INSULIN GLARGINE 100 UNIT/ML ~~LOC~~ SOLN
30.0000 [IU] | Freq: Every day | SUBCUTANEOUS | Status: DC
  Administered 2019-05-14: 30 [IU] via SUBCUTANEOUS
  Filled 2019-05-13: qty 0.3

## 2019-05-13 NOTE — Progress Notes (Signed)
Inpatient Diabetes Program Recommendations  AACE/ADA: New Consensus Statement on Inpatient Glycemic Control (2015)  Target Ranges:  Prepandial:   less than 140 mg/dL      Peak postprandial:   less than 180 mg/dL (1-2 hours)      Critically ill patients:  140 - 180 mg/dL   Lab Results  Component Value Date   GLUCAP 403 (H) 05/13/2019   HGBA1C 10.0 (H) 03/19/2019   Results for YOSIAH, HAIRR (MRN [id number]) as of 05/13/2019 09:56  Ref. Range 05/11/2019 07:37 05/11/2019 12:30 05/11/2019 16:58 05/11/2019 21:13 05/12/2019 07:46 05/12/2019 17:13  Glucose-Capillary Latest Ref Range: 70 - 99 mg/dL 199 (H) 215 (H) 267 (H) 93 89 No insulin Basal insulin not given 371 (H) 6 units Novolog meal coverage 15 units Novolog correction    Review of Glycemic Control Results for EMET, [place] (MRN [id number]) as of 05/13/2019 09:56  Ref. Range 05/12/2019 20:47 05/12/2019 23:47 05/13/2019 04:06 05/13/2019 04:35 05/13/2019 07:38  Glucose-Capillary Latest Ref Range: 70 - 99 mg/dL 512 (HH) 16 units Novolog Lantus 28 units  214 (H) 61 (L) 74 403 (H) Novolog 6 units meal coverage + 6 units correction  Diabetes history:  DM 1 Outpatient Diabetes medications:  Lantus 40 units bid, Humalog 4-20 units tid with meals Current orders for Inpatient glycemic control:  Lantus 28 units bid,  Novolog moderate tid with meals, Novolog 6 units tid with meals Inpatient Diabetes Program Recommendations:    Referral received.  Patient did not receive Lantus on 2/22 at 2200 or 2/23 at 1000.  Blood sugar not checked at lunch time and by 1713 CBG up to 371 mg/dL- This was likely due to patient not getting any basal insulin for 24 hours.    Consider reducing Lantus to 30 units once daily (0.4 units/kg).  Increase Novolog meal coverage to 8 units tid with meals and reduce Novolog correction to sensitive tid with meals and HS.  Patient needs basal insulin due to history of type 1 DM.  Will follow.   Thanks  Adah Perl, RN,  BC-ADM Inpatient Diabetes Coordinator Pager [phone] (8a-5p)

## 2019-05-13 NOTE — Consult Note (Addendum)
[address]       [place] [address]             [phone]        Kohen D Gantt Bertha Medical Record #[id number] Date of Birth: February 07, 1981  Referring: Kerney Elbe, DO Primary Care: Gildardo Pounds, NP Primary Cardiologist:No primary care provider on file.  Chief Complaint:   Mitral valve endocarditis          History of Present Illness:      Mr. 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  He was seeking medical attention and late December of last year he was noted to have positive blood cultures.  He signed out of the hospital AMA before this issue could be treated.  He had been recently sought medical attention in the community health clinic in the early part of February and several days after the visit was notified of his positive blood cultures from 1231 and told to go to the emergency room for treatment.  He presented to the emergency room on 05/09/2019.  At that time he was found to be tachycardic and had a white blood count of 20,000.  He had a lactic acidosis.  He was started on vancomycin and cefepime empirically.  He was admitted to the hospital by the internal medicine service.  Further work-up included a transthoracic echocardiogram that showed evidence of both mitral and aortic valve vegetations.  He was hemodynamically stable.  An infectious disease consult was requested and patient was seen by Dr. Tommy Medal.  By this time, repeat blood cultures had grown gram-positive cocci in chains consistent with strep species in 1 of 4 cultures.  Antibiotics were changed to ceftriaxone and later again modified to penicillin and gentamicin once final culture results were available.  Patient was seen  in consultation by the cardiology service.  Transesophageal echo was recommended and was performed on 05/12/2019.  The report is included below but in brief, the echo demonstrated the 10 x 15 mm echodensity on the anterior mitral valve leaflet and the 15 x 3 mm filamentous lesion on the P1 segment of the posterior leaflet consistent with endocarditis.  There was mild mitral regurgitation.  There was no evidence of aortic valve endocarditis. Mr. Clink remained stable.  He is afebrile.  He continues to have moderate leukocytosis with a white count is declined from 20,000-16,000 since admission.  He has a PICC line in place and is receiving IV antibiotics as prescribed by the infectious disease service.  A 2-week course of IV therapy has been recommended. Cardiothoracic surgery service has been requested to evaluate to determine potential need for surgical intervention.   Current Activity/ Functional Status: Patient is independent with mobility/ambulation, transfers, ADL's, IADL's.     Past Medical History:  Diagnosis Date  . Anemia   . Anxiety  Dx 2008  . Chronic kidney disease   . Diabetes mellitus without complication ([facility]) Dx 9163  . DKA (diabetic ketoacidoses) ([facility]) 07/21/2017  . GERD (gastroesophageal reflux disease) Dx 2008  . Headache(784.0)   . Hypertension   . Pneumonia   . Seizures ([facility] [facility]) 2011    x 2 in lifetime. on Dilantin for a while.   . Substance abuse ([place]) 2013  heroin use, multiple relapses    Past Surgical History:  Procedure Laterality Date  . AMPUTATION Left 09/05/2018   Procedure: LEFT GREAT TOE AMPUTATION;  Surgeon: [name], MD;  Location: [facility];  Service: Orthopedics;  Laterality: Left;  . I & D EXTREMITY Left 10/11/2012   Procedure: IRRIGATION AND DEBRIDEMENT ABSCESS FOREARM;  Surgeon: Linna Hoff, MD;  Location: [facility];  Service: Orthopedics;  Laterality: Left;  . I & D EXTREMITY Left 10/12/2012   Procedure: IRRIGATION AND DEBRIDEMENT FOREARM;  Surgeon:  Linna Hoff, MD;  Location: [facility];  Service: Orthopedics;  Laterality: Left;  . I & D EXTREMITY Left 10/14/2012   Procedure: incision and drainage left forearm;  Surgeon: Linna Hoff, MD;  Location: [facility];  Service: Orthopedics;  Laterality: Left;  . I & D EXTREMITY Left 10/16/2012   Procedure: IRRIGATION AND DEBRIDEMENT LEFT FOREARM;  Surgeon: Linna Hoff, MD;  Location: [facility];  Service: Orthopedics;  Laterality: Left;  . I & D EXTREMITY Left 10/20/2012   Procedure: INCISION AND DRAINAGE AND DEBRIDEMENT LEFT  FOREARM;  Surgeon: Linna Hoff, MD;  Location: [facility];  Service: Orthopedics;  Laterality: Left;  . INCISION AND DRAINAGE OF WOUND Bilateral 11/29/2017   Procedure: DEBRIDEMENT BILATERAL FEET, DEBRIDEMENT LEFT ANKLE, AND APPLY WOUND VAC;  Surgeon: [name], MD;  Location: [facility];  Service: Orthopedics;  Laterality: Bilateral;  . IRRIGATION AND DEBRIDEMENT ABSCESS     Hx: of left arm abscess related to drug use   . TEE WITHOUT CARDIOVERSION N/A 11/28/2017   Procedure: TRANSESOPHAGEAL ECHOCARDIOGRAM (TEE);  Surgeon: Sanda Klein, MD;  Location: [facility] ENDOSCOPY;  Service: Cardiovascular;  Laterality: N/A;    Social History   Tobacco Use  Smoking Status Current Every Day Smoker  . Packs/day: 1.00  . Types: Cigarettes  Smokeless Tobacco Never Used    Social History   Substance and Sexual Activity  Alcohol Use No  . Alcohol/week: 0.0 standard drinks   Comment: occasional     No Known Allergies  Current Facility-Administered Medications  Medication Dose Route Frequency Provider Last Rate Last Admin  . acetaminophen (TYLENOL) tablet 650 mg  650 mg Oral Q6H PRN Skeet Latch, MD   650 mg at 05/10/19 2836   Or  . acetaminophen (TYLENOL) suppository 650 mg  650 mg Rectal Q6H PRN Skeet Latch, MD      . amLODipine (NORVASC) tablet 5 mg  5 mg Oral Daily Skeet Latch, MD   5 mg at 05/13/19 0940  . atorvastatin (LIPITOR) tablet 20 mg  20 mg Oral Daily Skeet Latch, MD   20 mg at 05/13/19 0940  . buprenorphine-naloxone (SUBOXONE) 8-2 mg per SL tablet 1 tablet  1 tablet Sublingual BID Skeet Latch, MD   1 tablet at 05/11/19 2149  . Chlorhexidine Gluconate Cloth 2 % PADS 6 each  6 each Topical Daily Harold Hedge, MD   6 each at 05/13/19 [phone]  . DULoxetine (CYMBALTA) DR capsule 30 mg  30 mg Oral Daily Skeet Latch, MD   30 mg at 05/13/19 0940  . enoxaparin (LOVENOX) injection 40 mg  40 mg Subcutaneous Q24H Skeet Latch, MD   40 mg at 05/11/19 1439  . gabapentin (NEURONTIN) capsule 400 mg  400 mg Oral TID Skeet Latch, MD   400 mg at 05/13/19 0940  . gentamicin (GARAMYCIN) 210 mg in dextrose 5 % 50 mL IVPB  3 mg/kg (Ideal) Intravenous Q24H Skeet Latch, MD 110.5 mL/hr  at 05/12/19 2103 210 mg at 05/12/19 2103  . insulin aspart (novoLOG) injection 0-5 Units  0-5 Units Subcutaneous QHS Sheikh, Omair Latif, DO      . insulin aspart (novoLOG) injection 0-9 Units  0-9 Units Subcutaneous TID WC Raiford Noble [place], DO   7 Units at 05/13/19 1252  . insulin aspart (novoLOG) injection 8 Units  8 Units Subcutaneous TID WC Raiford Noble [place], DO   8 Units at 05/13/19 1252  . [START ON 05/14/2019] insulin glargine (LANTUS) injection 30 Units  30 Units Subcutaneous Daily Sheikh, Omair Latif, DO      . levothyroxine (SYNTHROID) tablet 50 mcg  50 mcg Oral Q0600 Skeet Latch, MD   50 mcg at 05/13/19 0502  . Melatonin TABS 6 mg  6 mg Oral QHS PRN Skeet Latch, MD   6 mg at 05/11/19 2149  . ondansetron (ZOFRAN) tablet 4 mg  4 mg Oral Q6H PRN Skeet Latch, MD       Or  . ondansetron [employer]) injection 4 mg  4 mg Intravenous Q6H PRN Skeet Latch, MD      . pantoprazole (PROTONIX) EC tablet 40 mg  40 mg Oral Daily Skeet Latch, MD   40 mg at 05/13/19 0940  . penicillin G potassium 12 Million Units in dextrose 5 % 500 mL continuous infusion  12 Million Units Intravenous Q12H Skeet Latch, MD 41.7 mL/hr at 05/13/19 1135 12  Million Units at 05/13/19 1135  . sertraline (ZOLOFT) tablet 50 mg  50 mg Oral Daily Skeet Latch, MD   50 mg at 05/13/19 0940  . sodium chloride flush (NS) 0.9 % injection 10-40 mL  10-40 mL Intracatheter Q12H Harold Hedge, MD   10 mL at 05/13/19 1100  . sodium chloride flush (NS) 0.9 % injection 10-40 mL  10-40 mL Intracatheter PRN Harold Hedge, MD      . traZODone (DESYREL) tablet 50 mg  50 mg Oral QHS PRN Gardiner Barefoot, NP   50 mg at 05/12/19 2147    Medications Prior to Admission  Medication Sig Dispense Refill Last Dose  . acetaminophen (TYLENOL) 325 MG tablet Take 2 tablets (650 mg total) by mouth every 6 (six) hours as needed for mild pain (or Fever >/= 101).   Past Week at Unknown time  . amLODipine (NORVASC) 5 MG tablet Take 1 tablet (5 mg total) by mouth daily. 90 tablet 3 05/08/2019 at Unknown time  . atorvastatin (LIPITOR) 20 MG tablet Take 1 tablet (20 mg total) by mouth daily. 90 tablet 3 05/08/2019 at Unknown time  . DULoxetine (CYMBALTA) 30 MG capsule TAKE 1 CAPSULE (30 MG TOTAL) BY MOUTH DAILY. 30 capsule 0 05/08/2019 at Unknown time  . gabapentin (NEURONTIN) 400 MG capsule Take 1 capsule (400 mg total) by mouth 3 (three) times daily. 90 capsule 2 05/08/2019 at Unknown time  . Insulin Glargine (LANTUS SOLOSTAR) 100 UNIT/ML Solostar Pen INJECT 40 UNITS INTO THE SKIN 2 (TWO) TIMES DAILY. (Patient taking differently: Inject 40 Units into the skin 2 (two) times daily. ) 15 mL 6 05/08/2019 at Unknown time  . insulin lispro (HUMALOG KWIKPEN) 100 UNIT/ML KwikPen Inject 0.04-0.2 mLs (4-20 Units total) into the skin 3 (three) times daily. Per sliding scale 15 mL 6 05/08/2019 at Unknown time  . levothyroxine (SYNTHROID) 50 MCG tablet TAKE 1 TABLET (50 MCG TOTAL) BY MOUTH DAILY. 30 tablet 0 05/08/2019 at Unknown time  . lisinopril (ZESTRIL) 10 MG tablet Take 1 tablet (10 mg total)  by mouth daily. 90 tablet 1 05/08/2019 at Unknown time  . omeprazole (PRILOSEC) 20 MG capsule Take 1  capsule (20 mg total) by mouth daily. 90 capsule 3 05/08/2019 at Unknown time  . sertraline (ZOLOFT) 50 MG tablet TAKE 1 TABLET (50 MG TOTAL) BY MOUTH DAILY. 30 tablet 0 05/08/2019 at Unknown time  . Blood Glucose Monitoring Suppl (TRUE METRIX METER) DEVI 1 kit by Does not apply route as directed. Use as directed 1 Device 0   . glucose blood test strip Use as instructed 100 each 12   . Insulin Syringe-Needle U-100 (TRUEPLUS INSULIN SYRINGE) 31G X 5/16" 0.5 ML MISC USE AS DIRECTED. 100 each 6   . TRUEPLUS LANCETS 28G MISC Use as directed 100 each 12     Family History  Problem Relation Age of Onset  . Diabetes Father   . Heart disease Father   . Mental illness Sister   . Cancer Neg Hx      Review of Systems:   ROS     Cardiac Review of Systems: Y or  [    ]= no  Chest Pain [    ]  Resting SOB [ y  ] Exertional SOB  Blue.Reese  ]  Orthopnea [  ]   Pedal Edema [   ]    Palpitations [  ] Syncope  [  ]   Presyncope [   ]  General Review of Systems: [Y] = yes [  ]=no Constitional: recent weight change [  ]; anorexia [  ]; fatigue [ y ]; nausea [  ]; night sweats [  ]; fever [  ]; or chills [  ]                                                               Dental: Last Dentist visit: He has not had regular dental visits in admits his teeth are in poor repair.  Eye : blurred vision [  ]; diplopia [   ]; vision changes [  ];  Amaurosis fugax[  ]; Resp: cough [  ];  wheezing[  ];  hemoptysis[  ]; shortness of breath[  ]; paroxysmal nocturnal dyspnea[  ]; dyspnea on exertion[  ]; or orthopnea[  ];  GI:  gallstones[  ], vomiting[  ];  dysphagia[  ]; melena[  ];  hematochezia [  ]; heartburn[  ];   Hx of  Colonoscopy[  ]; GU: kidney stones [  ]; hematuria[  ];   dysuria [  ];  nocturia[  ];  history of     obstruction [  ]; urinary frequency [  ]             Skin: rash, swelling[  ];, hair loss[  ];  peripheral edema[  ];  or itching[  ]; Musculosketetal: myalgias[  ];  joint swelling[  ];  joint erythema[   ];  joint pain[  ];  back pain[  ];  Heme/Lymph: bruising[  ];  bleeding[  ];  anemia[  ];  Neuro: TIA[  ];  headaches[  ];  stroke[  ];  vertigo[  ];  Seizures ;   paresthesias[  ];  difficulty walking[  ];  Psych:depression[  ]; anxiety[  ];  Endocrine: diabetes[  ];  thyroid dysfunction[  ];                  Physical Exam: BP 133/90 (BP Location: Left Arm)   Pulse 94   Temp 98.6 F (37 C) (Oral)   Resp 18   Ht '5\' 9"'$  (1.753 m)   Wt 81.6 kg   SpO2 99%   BMI 26.58 kg/m    General appearance: Pt is a pleasant, cooperative 39yo male in no distress. Head: Normocephalic, without obvious abnormality, atraumatic Neck: no adenopathy, no carotid bruit, no JVD and supple, symmetrical, trachea midline Lymph nodes: Cervical, supraclavicular, and axillary nodes normal. Resp: clear to auscultation bilaterally Cardio: RRR, no murmurs noted GI: soft, non-tender; bowel sounds normal; no masses,  no organomegaly Extremities: There are multiple ulcerations on the volar surface of the forearms and upper arms bilaterally that are in various stages of healing.  This is over a background of scar tissue that extends from the wrist up to above both elbows.  All of these lesions are dry.  He has a well-healed scar on the medial side of his left ankle consistent with an orthopedic procedure to treat osteomyelitis at his ankle Neurologic: Grossly normal  Diagnostic Studies & Laboratory data:     Recent Radiology Findings:   ECHO TEE  Result Date: 05/12/2019    TRANSESOPHOGEAL ECHO REPORT   Patient Name:   EGBERT SEIDEL Date of Exam: 05/12/2019 Medical Rec #:  [id number]    Height:       69.0 in Accession #:    [id number]   Weight:       180.0 lb Date of Birth:  Nov 13, 1980    BSA:          1.976 m Patient Age:    13 years     BP:           113/80 mmHg Patient Gender: M            HR:           82 bpm. Exam Location:  Inpatient Procedure: Transesophageal Echo Indications:     endocarditis  History:          Patient has prior history of Echocardiogram examinations, most                  recent 05/09/2019. Signs/Symptoms:Bacteremia.  Sonographer:     Johny Chess Referring Phys:  [id number] [place] Diagnosing Phys: Skeet Latch MD PROCEDURE: The transesophogeal probe was passed without difficulty through the esophogus of the patient. Sedation performed by different physician. The patient was monitored while under deep sedation. Anesthestetic sedation was provided intravenously by Anesthesiology: '412mg'$  of Propofol, '40mg'$  of Lidocaine. The patient's vital signs; including heart rate, blood pressure, and oxygen saturation; remained stable throughout the procedure. The patient developed no complications during the procedure. IMPRESSIONS  1. Left ventricular ejection fraction, by estimation, is 55 to 60%. The left ventricle has normal function. The left ventricle has no regional wall motion abnormalities. There is mild concentric left ventricular hypertrophy. Left ventricular diastolic function could not be evaluated.  2. Right ventricular systolic function is normal. The right ventricular size is normal.  3. No left atrial/left atrial appendage thrombus was detected.  4. There is a large (1.04 cm x 1.54 cm) round, well-circumscribed echodensity on the anterior (A2) mitral valve leaflet. There is also a mobile, filamentous lesion on the posterior (P1) leaflet (1.46 cm x 0.33 cm). Both are new since the TEE in  2019. Given the well-circumscribed nature of the anterior leaflet abnormality, suspect this may reflection prior endocarditis. The filamentous posterior leaflet finding is more consistent with acute endocarditis. The mitral valve is normal in structure and function. Mild mitral valve regurgitation. No evidence of mitral stenosis.  5. No evidence of aortic valve endocarditis. The aortic valve is normal in structure and function. Aortic valve regurgitation is mild to moderate. No aortic stenosis is present.  6. The  inferior vena cava is normal in size with greater than 50% respiratory variability, suggesting right atrial pressure of 3 mmHg. Conclusion(s)/Recommendation(s): Normal biventricular function without evidence of hemodynamically significant valvular heart disease. Findings are concerning for vegetation/infective endocarditis as detailed above. FINDINGS  Left Ventricle: Left ventricular ejection fraction, by estimation, is 55 to 60%. The left ventricle has normal function. The left ventricle has no regional wall motion abnormalities. The left ventricular internal cavity size was normal in size. There is  mild concentric left ventricular hypertrophy. Right Ventricle: The right ventricular size is normal. No increase in right ventricular wall thickness. Right ventricular systolic function is normal. Left Atrium: Left atrial size was normal in size. No left atrial/left atrial appendage thrombus was detected. Right Atrium: Right atrial size was normal in size. Pericardium: There is no evidence of pericardial effusion. Mitral Valve: There is a large (1.04 cm x 1.54 cm) round, well-circumscribed echodensity on the anterior (A2) mitral valve leaflet. There is also a mobile, filamentous lesion on the posterior (P1) leaflet (1.46 cm x 0.33 cm). Both are new since the TEE in 2019. Given the well-circumscribed nature of the anterior leaflet abnormality, suspect this may reflection prior endocarditis. The filamentous posterior leaflet finding is more consistent with acute endocarditis. The mitral valve is normal in structure and function. Normal mobility of the mitral valve leaflets. Mild mitral valve regurgitation. No evidence of mitral valve stenosis. MV peak gradient, 2.9 mmHg. The mean mitral valve gradient is 1.0 mmHg. Tricuspid Valve: The tricuspid valve is normal in structure. Tricuspid valve regurgitation is not demonstrated. No evidence of tricuspid stenosis. Aortic Valve: No evidence of aortic valve endocarditis. The  aortic valve is normal in structure and function. Aortic valve regurgitation is mild to moderate. Aortic regurgitation PHT measures 811 msec. No aortic stenosis is present. Pulmonic Valve: The pulmonic valve was normal in structure. Pulmonic valve regurgitation is not visualized. No evidence of pulmonic stenosis. Aorta: The aortic root is normal in size and structure. Venous: The inferior vena cava is normal in size with greater than 50% respiratory variability, suggesting right atrial pressure of 3 mmHg. IAS/Shunts: No atrial level shunt detected by color flow Doppler.  AORTIC VALVE AI PHT:      811 msec MITRAL VALVE MV Peak grad: 2.9 mmHg MV Mean grad: 1.0 mmHg MV Vmax:      0.84 m/s MV Vmean:     51.0 cm/s Skeet Latch MD Electronically signed by Skeet Latch MD Signature Date/Time: 05/12/2019/2:30:37 PM    Final    [place] EKG SITE RITE  Result Date: 05/12/2019 If Site Rite image not attached, placement could not be confirmed due to current cardiac rhythm.    I have independently reviewed the above radiologic studies and discussed with the patient   Recent Lab Findings: Lab Results  Component Value Date   WBC 16.5 (H) 05/13/2019   HGB 9.9 (L) 05/13/2019   HCT 32.0 (L) 05/13/2019   PLT 592 (H) 05/13/2019   GLUCOSE 85 05/13/2019   CHOL 237 (H) 10/21/2017   TRIG  224 (H) 10/21/2017   HDL 36 (L) 10/21/2017   LDLCALC 156 (H) 10/21/2017   ALT 17 05/08/2019   AST 21 05/08/2019   NA 138 05/13/2019   K 3.8 05/13/2019   CL 103 05/13/2019   CREATININE 1.25 (H) 05/13/2019   BUN 16 05/13/2019   CO2 26 05/13/2019   TSH 4.047 05/09/2019   INR 1.0 05/08/2019   HGBA1C 10.0 (H) 03/19/2019      Assessment / Plan:      Mr. Bollier is a pleasant 39 year old male with the above described as admitted for.  Would recommend repeating the echocardiogram following of positive blood cultures initially discovered on 12/31/202020.  He signed out AMA from that visit but earlier this month reconnected with  the community health clinic.  As result of that visit, he was again discovered to have had a positive blood cultures that had been left untreated and presented to the ED on the advice of the clinic. He presented to the [facility] ED and was found to be acutely septic.  Following admission, he has been confirmed to have mitral valve endocarditis with mild mitral insufficiency.  He has improved from a symptomatic standpoint. He is afebrile.  He does continue to have a leukocytosis.  He is being treated with culture-directed antibiotics by the infectious disease service for a penicillin-sensitive strep species.   Surgical intervention is not indicated at this point.  Agree with repeating the echocardiogram following the prescribed course of antibiotics. Dr. Kipp Brood will see Mr. Jungwirth and review clinical data.   I  spent 30 minutes counseling the patient face to face.   Antony Odea, PA-C 05/13/2019 3:08 PM    Agree with above.  Images reviewed, and case discussed with patient This is a 39 yo male who presents with Pen-sensitive strep mitral valve endocarditis in the setting of IV drug abuse.  He previously left AMA in December while being treated for osteomyelitis of his left foot.  He also has a history of skin popping, and has had multiple abscesses of his forearm.  He states that he wants to quit drugs.  He does not want surgery at this time.  On review of his TEE, he has a 1.5cm anterior leaflet vegetation, and small posterior leaflet vegetation.  He only has mild MR.  There is no evidence of aortic valve endocarditis.  There does not appear to be an annular abscess.  He does not have any conduction abnormalities on EKG, and most recent blood cultures have cleared  Given his ongoing drug use, and untreated extra-cardiac infections, I think that valve replacement would put him at a very high risk for prosthetic valve endocarditis in the near future.  He currently does not have any strong  indications for surgery.    Recs: - continue medical therapy  - reassess valve function at 6 weeks, and after completion of drug rehab.  Please call with questions.  Chrissi Crow Bary Leriche

## 2019-05-13 NOTE — Progress Notes (Addendum)
[facility]    Date of Admission:  05/08/2019   Total days of antibiotics 4          ID: Luke [name] is a 39 y.o. 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 No other concerns today. PICC in place.   ROS: 12 point ros is negative. No fever, chills, nightsweats, +difficulty with blood draws  Medications:  . amLODipine  5 mg Oral Daily  . atorvastatin  20 mg Oral Daily  . buprenorphine-naloxone  1 tablet Sublingual BID  . Chlorhexidine Gluconate Cloth  6 each Topical Daily  . DULoxetine  30 mg Oral Daily  . enoxaparin (LOVENOX) injection  40 mg Subcutaneous Q24H  . gabapentin  400 mg Oral TID  . insulin aspart  0-5 Units Subcutaneous QHS  . insulin aspart  0-9 Units Subcutaneous TID WC  . insulin aspart  8 Units Subcutaneous TID WC  . [START ON 05/14/2019] insulin glargine  30 Units Subcutaneous Daily  . levothyroxine  50 mcg Oral Q0600  . pantoprazole  40 mg Oral Daily  . sertraline  50 mg Oral Daily  . sodium chloride flush  10-40 mL Intracatheter Q12H    Objective: Vital signs in last 24 hours: Temp:  [97.9 F (36.6 C)-98.7 F (37.1 C)] 98 F (36.7 C) (02/24 0409) Pulse Rate:  [74-103] 93 (02/24 0409) Resp:  [16-21] 18 (02/24 0409) BP: (111-176)/(69-103) 125/80 (02/24 0409) SpO2:  [100 %] 100 % (02/24 0409)   Physical Exam  Constitutional: He is oriented to person, place, and time. He appears well-developed and well-nourished. No distress.  HENT:  Mouth/Throat: Oropharynx is clear and moist. No oropharyngeal exudate.    Cardiovascular: Normal rate, regular rhythm and +systolic murmur Pulmonary/Chest: Effort normal and breath sounds normal. No respiratory distress. He has no wheezes.  Abdominal: Soft. Bowel sounds are normal. He exhibits no distension. There is no tenderness.  Lymphadenopathy:  He has no cervical adenopathy.  Ext: significant keloid scarring to ACs bilaterally Neurological: He is alert and oriented to person, place, and time.  Skin: Skin is warm and dry. No rash noted. No erythema.  Psychiatric: He has a normal mood and affect. His behavior is normal.     Lab Results Recent Labs    05/12/19 0130 05/13/19 0430  WBC 16.5* 16.5*  HGB 10.0* 9.9*  HCT 31.3* 32.0*  NA 135 138  K 4.1 3.8  CL 102 103  CO2 22 26  BUN 17 16  CREATININE 1.19 1.25*   Liver Panel No results for input(s): PROT, ALBUMIN, AST, ALT, ALKPHOS, BILITOT, BILIDIR, IBILI in the last 72 hours.  Microbiology: 2/21 blood cx NGTD at 24hr 2/19 blood cx + Organism ID, Bacteria STREPTOCOCCUS MITIS/ORALIS   Resulting Agency [facility]  Susceptibility   Streptococcus mitis/oralis    MIC    CEFTRIAXONE  Sensitive1    CLINDAMYCIN <=0.25 SENS... Sensitive    PENICILLIN  Sensitive2    TETRACYCLINE 0.5 SENSITIVE  Sensitive    VANCOMYCIN 0.5 SENSITIVE  Sensitive  Results: ECHO TEE  Result Date: 05/12/2019    TRANSESOPHOGEAL ECHO REPORT   Patient Name:   Luke [name] Date of Exam: 05/12/2019 Medical Rec #:  [id number]    Height:       69.0 in Accession #:    [id number]   Weight:       180.0 lb Date of Birth:  03/24/1980    BSA:          1.976 m Patient Age:    84 years     BP:           113/80 mmHg Patient Gender: M            HR:           82 bpm. Exam Location:  Inpatient Procedure: Transesophageal Echo Indications:     endocarditis  History:         Patient has prior history of Echocardiogram examinations, most                  recent 05/09/2019. Signs/Symptoms:Bacteremia.  Sonographer:     Johny Chess  Referring Phys:  [id number] [place] Diagnosing Phys: Skeet Latch MD PROCEDURE: The transesophogeal probe was passed without difficulty through the esophogus of the patient. Sedation performed by different physician. The patient was monitored while under deep sedation. Anesthestetic sedation was provided intravenously by Anesthesiology: 412mg  of Propofol, 40mg  of Lidocaine. The patient's vital signs; including heart rate, blood pressure, and oxygen saturation; remained stable throughout the procedure. The patient developed no complications during the procedure. IMPRESSIONS  1. Left ventricular ejection fraction, by estimation, is 55 to 60%. The left ventricle has normal function. The left ventricle has no regional wall motion abnormalities. There is mild concentric left ventricular hypertrophy. Left ventricular diastolic function could not be evaluated.  2. Right ventricular systolic function is normal. The right ventricular size is normal.  3. No left atrial/left atrial appendage thrombus was detected.  4. There is a large (1.04 cm x 1.54 cm) round, well-circumscribed echodensity on the anterior (A2) mitral valve leaflet. There is also a mobile, filamentous lesion on the posterior (P1) leaflet (1.46 cm x 0.33 cm). Both are new since the TEE in 2019. Given the well-circumscribed nature of the anterior leaflet abnormality, suspect this may reflection prior endocarditis. The filamentous posterior leaflet finding is more consistent with acute endocarditis. The mitral valve is normal in structure and function. Mild mitral valve regurgitation. No evidence of mitral stenosis.  5. No evidence of aortic valve endocarditis. The aortic valve is normal in structure and function. Aortic valve regurgitation is mild to moderate. No aortic stenosis is present.  6. The inferior vena cava is normal in size with greater than 50% respiratory variability, suggesting right atrial pressure of 3 mmHg. Conclusion(s)/Recommendation(s):  Normal biventricular function without evidence of hemodynamically significant valvular heart disease. Findings are concerning for vegetation/infective endocarditis as detailed above. FINDINGS  Left Ventricle: Left ventricular ejection fraction, by estimation, is 55 to 60%. The left ventricle has normal function. The left ventricle has no regional wall motion abnormalities. The left ventricular internal cavity size was normal in size. There is  mild concentric left ventricular hypertrophy. Right Ventricle: The right ventricular size is normal. No increase in right ventricular wall thickness. Right ventricular systolic function is normal. Left Atrium: Left atrial size was normal in size. No left atrial/left atrial appendage thrombus was detected. Right Atrium: Right atrial size was normal in size. Pericardium: There is no evidence of pericardial  effusion. Mitral Valve: There is a large (1.04 cm x 1.54 cm) round, well-circumscribed echodensity on the anterior (A2) mitral valve leaflet. There is also a mobile, filamentous lesion on the posterior (P1) leaflet (1.46 cm x 0.33 cm). Both are new since the TEE in 2019. Given the well-circumscribed nature of the anterior leaflet abnormality, suspect this may reflection prior endocarditis. The filamentous posterior leaflet finding is more consistent with acute endocarditis. The mitral valve is normal in structure and function. Normal mobility of the mitral valve leaflets. Mild mitral valve regurgitation. No evidence of mitral valve stenosis. MV peak gradient, 2.9 mmHg. The mean mitral valve gradient is 1.0 mmHg. Tricuspid Valve: The tricuspid valve is normal in structure. Tricuspid valve regurgitation is not demonstrated. No evidence of tricuspid stenosis. Aortic Valve: No evidence of aortic valve endocarditis. The aortic valve is normal in structure and function. Aortic valve regurgitation is mild to moderate. Aortic regurgitation PHT measures 811 msec. No aortic stenosis is  present. Pulmonic Valve: The pulmonic valve was normal in structure. Pulmonic valve regurgitation is not visualized. No evidence of pulmonic stenosis. Aorta: The aortic root is normal in size and structure. Venous: The inferior vena cava is normal in size with greater than 50% respiratory variability, suggesting right atrial pressure of 3 mmHg. IAS/Shunts: No atrial level shunt detected by color flow Doppler.  AORTIC VALVE AI PHT:      811 msec MITRAL VALVE MV Peak grad: 2.9 mmHg MV Mean grad: 1.0 mmHg MV Vmax:      0.84 m/s MV Vmean:     51.0 cm/s Skeet Latch MD Electronically signed by Skeet Latch MD Signature Date/Time: 05/12/2019/2:30:37 PM    Final    VAS [place] LOWER EXTREMITY VENOUS (DVT)  Result Date: 05/12/2019  Lower Venous DVTStudy Indications: Pain, and Swelling. Other Indications: IVDU. Comparison Study: No prior study Performing Technologist: Maudry Mayhew MHA, RDMS, RVT, RDCS  Examination Guidelines: A complete evaluation includes B-mode imaging, spectral Doppler, color Doppler, and power Doppler as needed of all accessible portions of each vessel. Bilateral testing is considered an integral part of a complete examination. Limited examinations for reoccurring indications may be performed as noted. The reflux portion of the exam is performed with the patient in reverse Trendelenburg.  +---------+---------------+---------+-----------+----------+--------------+ RIGHT    CompressibilityPhasicitySpontaneityPropertiesThrombus Aging +---------+---------------+---------+-----------+----------+--------------+ CFV      Full           Yes      Yes                                 +---------+---------------+---------+-----------+----------+--------------+ SFJ      Full                                                        +---------+---------------+---------+-----------+----------+--------------+ FV Prox  Full                                                         +---------+---------------+---------+-----------+----------+--------------+ FV Mid   Full                                                        +---------+---------------+---------+-----------+----------+--------------+  FV DistalFull                                                        +---------+---------------+---------+-----------+----------+--------------+ PFV      Full                                                        +---------+---------------+---------+-----------+----------+--------------+ POP      Full           Yes      Yes                                 +---------+---------------+---------+-----------+----------+--------------+ PTV      Full                                                        +---------+---------------+---------+-----------+----------+--------------+ PERO     Full                                                        +---------+---------------+---------+-----------+----------+--------------+ Gastroc  Full                    Yes                                 +---------+---------------+---------+-----------+----------+--------------+   +----+---------------+---------+-----------+----------+--------------+ LEFTCompressibilityPhasicitySpontaneityPropertiesThrombus Aging +----+---------------+---------+-----------+----------+--------------+ CFV Full           Yes      Yes                                 +----+---------------+---------+-----------+----------+--------------+     Summary: RIGHT: - There is no evidence of deep vein thrombosis in the lower extremity.  - No cystic structure found in the popliteal fossa. - Ultrasound characteristics of enlarged lymph nodes are noted in the groin.  LEFT: - No evidence of common femoral vein obstruction. - Ultrasound characteristics of enlarged lymph nodes noted in the groin.  *See table(s) above for measurements and observations. Electronically signed by Ruta Hinds MD on  05/12/2019 at 11:49:21 AM.    Final    [place] EKG SITE RITE  Result Date: 05/12/2019 If [facility] image not attached, placement could not be confirmed due to current cardiac rhythm.    Assessment/Plan: Streptococcal Native Mitral Valve Endocarditis = Will continue with 2 weeks of treatment with synergistic Gentamicin + Penicillin. Would ask TCTS to evaluate given large left sided involvement and comment on cyst-appearing material. With moderate mitral regurgitation would anticipate he would at least need cardiology follow up outpatient following treatment completion.   Dental Pain = likely course of #1. Will evaluate with oropantogram today.  Leukocytosis = persistent. May be related to large vegetation described vs alternative seeded area yet to realize.  Will check A/P CXR today to eval for possible septic emboli. No TV involvement on TEE noted but would not want to miss evolving pulmonary process given his risk for TV involvement with injection drug use is high. He is without pulmonary symptoms so will defer CT for now.   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 Seems comfortable and willing to stay. On Suboxone for now. Hopeful he will be interested in long term treatment.   AKI = Cr slightly elevated @ 1.25 today. Follow closely on Gentamicin .  Multiple skin lesions = no changes, doing well.   Vascular Access = PICC in place currently, functioning well.   Hepatitis C = Genotype 1a, with RNA level 10,500,000 copies on 11-2017. Current RNA pending. Will offer follow up in clinic for consideration of treatment if he is interested. He could use Hep B vaccines also. Hep B sAg pending.    [name], MSN, NP-C [facility] [email or web address] Pager: [phone] Office: [place]: [phone]  05/13/2019, 12:37  PM

## 2019-05-13 NOTE — Progress Notes (Signed)
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 Had recent positive strep viridans blood cultures on 12/31 during recent admission but patient left AMA. Lost to follow up until he initiated a telehealth visit for recurrent skin infections on 2/1, then on 2/14 was notified that he had positive blood cultures from 12/31 and was told to go to the ER. Began having chest pain and shortness of breath prompting his admission on 2/20. started on empiric vancomycin/cefepime.    2/21 TTE showed AV/MV vegetations, infectious disease and cardiology consulted. 2/23: TEE done and showed an EF of 65% from large, cystlike structure on the A2 segment and mobile, filamentous structure in the P1 region of the mitral valve.  Which was new compared to the findings on the TEE back in 11/2017.  2/24 Cardiothoracic surgery was consulted and patient had a PICC line placed yesterday.  ID has changed him to gentamicin and penicillin G and will continue antibiotic recommendations per ID.  Assessment & Plan:   Principal Problem:   Sepsis ([place]) Active Problems:   Drug abuse, IV ([place])   Essential hypertension   CKD (chronic kidney disease) stage 3, GFR 30-59 ml/min   Cellulitis   Diabetic polyneuropathy associated with type 1 diabetes mellitus ([name])   Hepatitis C   Hypothyroidism   Heroin abuse ([place])   Acute bacterial endocarditis   Sepsis Secondary to 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, in setting of IVDU sepsis resolved -Hx of strep viridans bacteremia diagnosed 03/19/19, left AMA and was not treated -WBC went from increase 16.4->16.8 -> 16.5 -> 16.5, down from 20 -ID gave oritavancin x1 and  changed to ceftriaxone and now is on Gentamicin 210 mg q24h and Pen G and will need 2 weeks of it -Asked TCTS to evaluate given his Endocarditis and Moderate Mitral Regurgitation  -TEE yesterday showed a large cystlike structure on the A2 segment mobile filamentous on the A2 segment and mobile on the P1 region of the mitral valve along with moderate mitral regurg and no mitral stenosis.  No evidence of endocarditis of the aortic valve and no LA/LAA thrombus or mass.  There is no ASD or PFO noted. -Appreciate further ID recommendations and there ordering an orthopantogram given the -They feel that the patient's persistent leukocytosis may be related to the large vegetation described with some altered negative seated area that we have yet to realize -ID has ordered a AP chest x-ray for evaluation of possible septic emboli and there is no tricuspid valve involvement in the TEE so they are deferring CT for now  Bilateral upper extremity wounds present on admission, likely secondary to IVDU -Leading to difficulty of IV access -Wound care -PICC line is now in place  DM1 with episode of Hypoglycemia/Hyperglycemia  -He has had several episodes of hypoglycemia and hyperglycemia and his blood sugars have been extremely variable -HA1c in December 10.0 -He had missed his Lantus for 2 days and unclear why but this is been restarted and he is on Lantus 30 units now and have increased his mealtime insulin to 8 units 3 times daily with meals and also change to sliding scale to sensitive NovoLog sliding scale before meals and at bedtime -Continue to monitor CBGs  carefully and adjust as necessary -CBGs have been fluctuating went from 61-512  AKI on CKD 3 -Currently 2.03 on admission, currently at baseline and improved -She has BUN/creatinine is now 16/1.25 and yesterday was 17/1.19 -Continue Holding lisinopril -Continue to Monitor and Trend Renal Fxn -Repeat CMP in AM   Hypothyroidism -Stable -C/w  Levothyroxine 50 mcg po Daily   HTN -Continue Amlodipine 5 mg po Daily  -Holding Lisinopril for now  Right lower extremity calf tenderness/swelling -RLE [place]: No DVT, bilateral groin lymphadenopathy noted -? Cause of his Leukocytosis -May consider further imaging   IVDU -Wishes to quit -Started on Suboxone this admission by prior Physician and is now on Buprenorphine-Naloxone 8-2 mg per SL 1 tab SL BID -Follow up with SW  GERD -C/w PPI Pantoprazole 40 mg po Daily   Anxiety  -C/w Sertraline 50 mg po Daily, Duloxetine 400 mg po TID,   HLD -C/w Atorvastatin 20 mg po Daily   Microcytic Anemia -Stable -Patient's hemoglobin/hematocrit went from 10.4/32.1 down to 10.0/31.3 yesterday and today it is 9.9/32.0 -Check anemia panel in the a.m. -Continue to monitor for signs and symptoms of bleeding; currently no overt bleeding noted -Repeat CBC in a.m.  Thrombocytosis -Likely reactive in the setting of his infection and antibiotic usage -Patient's platelet count went from 532 is now 592 -Continue to monitor and trend repeat CBC in a.m.  Hepatitis C -The genotype of 1A and RNA level of 10,500,000 back in 2019 -Repeat RNA is pending is having -Follow-up with ID in the clinic  DVT prophylaxis: Enoxaparin 40 sq q24h Code Status: FULL CODE  Family Communication: No family present at bedside  Disposition Plan: Remain Hospitalized for Inpatient Treat  Consultants:   Infectious Diseases  Cardiology  TCTS  Procedures:  TEE Brief TEE Note  LVEF 60-65% Large, cyst-like structure on the A2 segment and mobile, filamentous structure on the P1 region of the mitral valve. Findings are new compared to TEE 11/2017. Moderate mitral regurgitation.  No mitral stenosis. No evidence of endocarditis on the aortic valve. No LA/LAA thrombus or mass No ASD or PFO.   Antimicrobials:  Anti-infectives (From admission, onward)   Start     Dose/Rate Route Frequency Ordered Stop   05/12/19  0500  penicillin G potassium 12 Million Units in dextrose 5 % 500 mL continuous infusion     12 Million Units 41.7 mL/hr over 12 Hours Intravenous Every 12 hours 05/11/19 1604     05/11/19 1700  gentamicin (GARAMYCIN) 210 mg in dextrose 5 % 50 mL IVPB     3 mg/kg  70.7 kg (Ideal) 110.5 mL/hr over 30 Minutes Intravenous Every 24 hours 05/11/19 1618     05/10/19 1400  cefTRIAXone (ROCEPHIN) 2 g in sodium chloride 0.9 % 100 mL IVPB  Status:  Discontinued     2 g 200 mL/hr over 30 Minutes Intravenous Every 24 hours 05/10/19 1259 05/11/19 1604   05/10/19 1330  Oritavancin Diphosphate (ORBACTIV) 1,200 mg in dextrose 5 % IVPB     1,200 mg 333.3 mL/hr over 180 Minutes Intravenous Once 05/10/19 1317 05/10/19 1935   05/10/19 0845  ceFEPIme (MAXIPIME) 2 g in sodium chloride 0.9 % 100 mL IVPB  Status:  Discontinued     2 g 200 mL/hr over 30 Minutes Intravenous Every 8 hours 05/10/19 0836 05/10/19 1259   05/10/19 0000  vancomycin (VANCOREADY) IVPB 1250 mg/250 mL  Status:  Discontinued     1,250 mg 166.7 mL/hr over 90 Minutes Intravenous Every 24  hours 05/09/19 0031 05/10/19 1317   05/09/19 2200  ceFEPIme (MAXIPIME) 2 g in sodium chloride 0.9 % 100 mL IVPB  Status:  Discontinued     2 g 200 mL/hr over 30 Minutes Intravenous Every 12 hours 05/09/19 1041 05/10/19 0836   05/09/19 0800  ceFEPIme (MAXIPIME) 2 g in sodium chloride 0.9 % 100 mL IVPB  Status:  Discontinued     2 g 200 mL/hr over 30 Minutes Intravenous Every 8 hours 05/09/19 0012 05/09/19 1041   05/09/19 0015  ceFEPIme (MAXIPIME) 2 g in sodium chloride 0.9 % 100 mL IVPB     2 g 200 mL/hr over 30 Minutes Intravenous  Once 05/09/19 0004 05/09/19 0130   05/09/19 0015  metroNIDAZOLE (FLAGYL) IVPB 500 mg     500 mg 100 mL/hr over 60 Minutes Intravenous  Once 05/09/19 0004 05/09/19 0150   05/09/19 0015  vancomycin (VANCOCIN) IVPB 1000 mg/200 mL premix  Status:  Discontinued     1,000 mg 200 mL/hr over 60 Minutes Intravenous  Once 05/09/19 0004  05/09/19 0010   05/09/19 0015  vancomycin (VANCOREADY) IVPB 1500 mg/300 mL     1,500 mg 150 mL/hr over 120 Minutes Intravenous  Once 05/09/19 0010 05/09/19 0351     Subjective: Seen and examined at bedside and states that he is doing well and had no nausea or vomiting.  No other concerns or complaints at this time and states that he slept well.  Denies any complaints to me but then told the infectious disease nurse practitioner that he was having some dental pain.  No other concerns or plans at this time.  Objective: Vitals:   05/12/19 1425 05/12/19 1453 05/12/19 2050 05/13/19 0409  BP: (!) 127/103 (!) 154/98 (!) 145/98 125/80  Pulse: 99  (!) 103 93  Resp: 17  16 18   Temp: 98.5 F (36.9 C)  98.7 F (37.1 C) 98 F (36.7 C)  TempSrc: Oral  Oral Oral  SpO2: 100%  100% 100%  Weight:      Height:        Intake/Output Summary (Last 24 hours) at 05/13/2019 1343 Last data filed at 05/13/2019 1056 Gross per 24 hour  Intake 1010 ml  Output 2750 ml  Net -1740 ml   Filed Weights   05/09/19 0400 05/12/19 1118  Weight: 80 kg 81.6 kg   Examination: Physical Exam:  Constitutional: WN/WD overweight African-American male currently in NAD and appears calm  Eyes: Lids and conjunctivae normal, sclerae anicteric  ENMT: External Ears, Nose appear normal. Grossly normal hearing. Neck: Appears normal, supple, no cervical masses, normal ROM, no appreciable thyromegaly Respiratory: Diminished to auscultation bilaterally, no wheezing, rales, rhonchi or crackles. Normal respiratory effort and patient is not tachypenic. No accessory muscle use.  Cardiovascular: RRR, no murmurs / rubs / gallops.  Has a 2/6 systolic murmur Abdomen: Soft, non-tender, non-distended. No masses palpated. No appreciable hepatosplenomegaly. Bowel sounds positive.  GU: Deferred. Musculoskeletal: No clubbing / cyanosis of digits/nails.  Has a left great toe amputation Skin: Bilateral upper arm scarring and keloids due to his  prior Drug Abuse Neurologic: CN 2-12 grossly intact with no focal deficits. Romberg sign and cerebellar reflexes not assessed.  Psychiatric: Normal judgment and insight. Alert and oriented x 3. Normal mood and appropriate affect.   Data Reviewed: I have personally reviewed following labs and imaging studies  CBC: Recent Labs  Lab 05/08/19 2210 05/08/19 2210 05/09/19 0634 05/10/19 0650 05/11/19 0321 05/12/19 0130 05/13/19 0430  WBC 20.1*   < >  18.2* 16.4* 16.8* 16.5* 16.5*  NEUTROABS 13.7*  --   --   --   --   --   --   HGB 10.1*   < > 9.3* 10.7* 10.4* 10.0* 9.9*  HCT 32.0*   < > 30.3* 33.9* 32.1* 31.3* 32.0*  MCV 75.3*   < > 76.5* 75.0* 72.3* 73.1* 75.5*  PLT 592*   < > 495* 520* 532* 534* 592*   < > = values in this interval not displayed.   Basic Metabolic Panel: Recent Labs  Lab 05/09/19 0136 05/10/19 0650 05/11/19 0321 05/12/19 0130 05/13/19 0430  NA 133* 134* 131* 135 138  K 3.5 4.0 3.9 4.1 3.8  CL 98 102 98 102 103  CO2 22 22 23 22 26   GLUCOSE 148* 49* 256* 141* 85  BUN 26* 12 12 17 16   CREATININE 1.79* 1.15 1.23 1.19 1.25*  CALCIUM 8.7* 8.9 8.7* 8.7* 8.8*   GFR: Estimated Creatinine Clearance: 79.3 mL/min (A) (by C-G formula based on SCr of 1.25 mg/dL (H)). Liver Function Tests: Recent Labs  Lab 05/08/19 2210  AST 21  ALT 17  ALKPHOS 136*  BILITOT 0.4  PROT 7.7  ALBUMIN 2.9*   No results for input(s): LIPASE, AMYLASE in the last 168 hours. No results for input(s): AMMONIA in the last 168 hours. Coagulation Profile: Recent Labs  Lab 05/08/19 2210  INR 1.0   Cardiac Enzymes: No results for input(s): CKTOTAL, CKMB, CKMBINDEX, TROPONINI in the last 168 hours. BNP (last 3 results) No results for input(s): PROBNP in the last 8760 hours. HbA1C: No results for input(s): HGBA1C in the last 72 hours. CBG: Recent Labs  Lab 05/12/19 2347 05/13/19 0406 05/13/19 0435 05/13/19 0738 05/13/19 1212  GLUCAP 214* 61* 74 403* 349*   Lipid Profile: No  results for input(s): CHOL, HDL, LDLCALC, TRIG, CHOLHDL, LDLDIRECT in the last 72 hours. Thyroid Function Tests: No results for input(s): TSH, T4TOTAL, FREET4, T3FREE, THYROIDAB in the last 72 hours. Anemia Panel: No results for input(s): VITAMINB12, FOLATE, FERRITIN, TIBC, IRON, RETICCTPCT in the last 72 hours. Sepsis Labs: Recent Labs  Lab 05/08/19 2211 05/09/19 0040  LATICACIDVEN 2.2* 1.6    Recent Results (from the past 240 hour(s))  Culture, blood (Routine x 2)     Status: Abnormal   Collection Time: 05/08/19 10:00 PM   Specimen: BLOOD RIGHT HAND  Result Value Ref Range Status   Specimen Description BLOOD RIGHT HAND  Final   Special Requests   Final    BOTTLES DRAWN AEROBIC ONLY Blood Culture adequate volume   Culture  Setup Time   Final    AEROBIC BOTTLE ONLY GRAM POSITIVE COCCI CRITICAL RESULT CALLED TO, READ BACK BY AND VERIFIED WITH: PHRMD FEAY LORA AT [id number] BY MESSAN Monson ON 05/10/2019 Performed at [facility], [place] [address], [place], [place] [address]    Culture STREPTOCOCCUS MITIS/ORALIS (A)  Final   Report Status 05/11/2019 FINAL  Final   Organism ID, Bacteria STREPTOCOCCUS MITIS/ORALIS  Final      Susceptibility   Streptococcus mitis/oralis - MIC*    TETRACYCLINE 0.5 SENSITIVE Sensitive     VANCOMYCIN 0.5 SENSITIVE Sensitive     CLINDAMYCIN <=0.25 SENSITIVE Sensitive     PENICILLIN Value in next row Sensitive      SENSITIVE<=0.06    CEFTRIAXONE Value in next row Sensitive      SENSITIVE<=0.12    * STREPTOCOCCUS MITIS/ORALIS  Blood Culture ID Panel (Reflexed)     Status:  Abnormal   Collection Time: 05/08/19 10:00 PM  Result Value Ref Range Status   Enterococcus species NOT DETECTED NOT DETECTED Final   Listeria monocytogenes NOT DETECTED NOT DETECTED Final   Staphylococcus species NOT DETECTED NOT DETECTED Final   Staphylococcus aureus (BCID) NOT DETECTED NOT DETECTED Final   Streptococcus species DETECTED (A) NOT DETECTED Final    Comment: Not  Enterococcus species, Streptococcus agalactiae, Streptococcus pyogenes, or Streptococcus pneumoniae. CRITICAL RESULT CALLED TO, READ BACK BY AND VERIFIED WITH: PHRMD FEAY LORA AT [id number] BY [place] ON 05/10/2019    Streptococcus agalactiae NOT DETECTED NOT DETECTED Final   Streptococcus pneumoniae NOT DETECTED NOT DETECTED Final   Streptococcus pyogenes NOT DETECTED NOT DETECTED Final   Acinetobacter baumannii NOT DETECTED NOT DETECTED Final   Enterobacteriaceae species NOT DETECTED NOT DETECTED Final   Enterobacter cloacae complex NOT DETECTED NOT DETECTED Final   Escherichia coli NOT DETECTED NOT DETECTED Final   Klebsiella oxytoca NOT DETECTED NOT DETECTED Final   Klebsiella pneumoniae NOT DETECTED NOT DETECTED Final   Proteus species NOT DETECTED NOT DETECTED Final   Serratia marcescens NOT DETECTED NOT DETECTED Final   Haemophilus influenzae NOT DETECTED NOT DETECTED Final   Neisseria meningitidis NOT DETECTED NOT DETECTED Final   Pseudomonas aeruginosa NOT DETECTED NOT DETECTED Final   Candida albicans NOT DETECTED NOT DETECTED Final   Candida glabrata NOT DETECTED NOT DETECTED Final   Candida krusei NOT DETECTED NOT DETECTED Final   Candida parapsilosis NOT DETECTED NOT DETECTED Final   Candida tropicalis NOT DETECTED NOT DETECTED Final    Comment: Performed at [facility], [place] [address], [place], [place] [address]  Culture, blood (Routine x 2)     Status: Abnormal   Collection Time: 05/08/19 10:12 PM   Specimen: BLOOD RIGHT ARM  Result Value Ref Range Status   Specimen Description BLOOD RIGHT ARM  Final   Special Requests   Final    BOTTLES DRAWN AEROBIC AND ANAEROBIC Blood Culture adequate volume   Culture  Setup Time   Final    GRAM POSITIVE COCCI IN BOTH AEROBIC AND ANAEROBIC BOTTLES CRITICAL RESULT CALLED TO, READ BACK BY AND VERIFIED WITH: PHARMD JESSICA BARNEY @1838  05/09/19 AKT    Culture (A)  Final    STREPTOCOCCUS MITIS/ORALIS SUSCEPTIBILITIES  PERFORMED ON PREVIOUS CULTURE WITHIN THE LAST 5 DAYS. Performed at [facility], [place] [address] [place], [place] [address]    Report Status 05/11/2019 FINAL  Final  Urine culture     Status: None   Collection Time: 05/09/19 12:03 AM   Specimen: In/Out Cath Urine  Result Value Ref Range Status   Specimen Description IN/OUT CATH URINE  Final   Special Requests NONE  Final   Culture   Final    NO GROWTH Performed at [facility], [place] [address] [place], [place] [address]    Report Status 05/10/2019 FINAL  Final  SARS CORONAVIRUS 2 (TAT 6-24 HRS) Nasopharyngeal Nasopharyngeal Swab     Status: None   Collection Time: 05/09/19  1:24 AM   Specimen: Nasopharyngeal Swab  Result Value Ref Range Status   SARS Coronavirus 2 NEGATIVE NEGATIVE Final    Comment: (NOTE) SARS-CoV-2 target nucleic acids are NOT DETECTED. The SARS-CoV-2 RNA is generally detectable in upper and lower respiratory specimens during the acute phase of infection. Negative results do not preclude SARS-CoV-2 infection, do not rule out co-infections with other pathogens, and should not be used as the sole basis  for treatment or other patient management decisions. Negative results must be combined with clinical observations, patient history, and epidemiological information. The expected result is Negative. Fact Sheet for Patients: SugarRoll.be Fact Sheet for Healthcare Providers: https://www.woods-mathews.com/ This test is not yet approved or cleared by the [place] FDA and  has been authorized for detection and/or diagnosis of SARS-CoV-2 by FDA under an Emergency Use Authorization (EUA). This EUA will remain  in effect (meaning this test can be used) for the duration of the COVID-19 declaration under Section 56 4(b)(1) of the Act, 21 U.S.C. section 360bbb-3(b)(1), unless the authorization is terminated or revoked sooner. Performed at [facility],  [place] [address] [place], [place] [address]   Culture, blood (Routine X 2) w Reflex to ID Panel     Status: None (Preliminary result)   Collection Time: 05/10/19  3:22 PM   Specimen: BLOOD  Result Value Ref Range Status   Specimen Description BLOOD LEFT ARM  Final   Special Requests   Final    BOTTLES DRAWN AEROBIC ONLY Blood Culture adequate volume   Culture   Final    NO GROWTH 2 DAYS Performed at [facility], [place] [address] [place], [place] [address]    Report Status PENDING  Incomplete  Culture, blood (Routine X 2) w Reflex to ID Panel     Status: None (Preliminary result)   Collection Time: 05/10/19  3:29 PM   Specimen: BLOOD  Result Value Ref Range Status   Specimen Description BLOOD LEFT ARM  Final   Special Requests   Final    BOTTLES DRAWN AEROBIC ONLY Blood Culture adequate volume   Culture   Final    NO GROWTH 2 DAYS Performed at [facility], [address], [place], [place] [address]    Report Status PENDING  Incomplete  MRSA PCR Screening     Status: None   Collection Time: 05/11/19  5:25 PM   Specimen: Nasal Mucosa; Nasopharyngeal  Result Value Ref Range Status   MRSA by PCR NEGATIVE NEGATIVE Final    Comment:        The GeneXpert MRSA Assay (FDA approved for NASAL specimens only), is one component of a comprehensive MRSA colonization surveillance program. It is not intended to diagnose MRSA infection nor to guide or monitor treatment for MRSA infections. Performed at [facility], [place] [address] [place], [place] [address]     RN Pressure Injury Documentation:     Estimated body mass index is 26.58 kg/m as calculated from the following:   Height as of this encounter: 5\' 9"  (1.753 m).   Weight as of this encounter: 81.6 kg.  Malnutrition Type:      Malnutrition Characteristics:      Nutrition Interventions:      Radiology Studies: ECHO TEE  Result Date: 05/12/2019    TRANSESOPHOGEAL ECHO REPORT   Patient Name:   ELDEAN GEAN Date of Exam: 05/12/2019 Medical Rec #:  [id number]    Height:       69.0 in Accession #:    [id number]   Weight:       180.0 lb Date of Birth:  02/24/1981    BSA:          1.976 m Patient Age:    39 years     BP:           113/80 mmHg Patient Gender: M  HR:           82 bpm. Exam Location:  Inpatient Procedure: Transesophageal Echo Indications:     endocarditis  History:         Patient has prior history of Echocardiogram examinations, most                  recent 05/09/2019. Signs/Symptoms:Bacteremia.  Sonographer:     Johny Chess Referring Phys:  [id number] [place] Diagnosing Phys: Skeet Latch MD PROCEDURE: The transesophogeal probe was passed without difficulty through the esophogus of the patient. Sedation performed by different physician. The patient was monitored while under deep sedation. Anesthestetic sedation was provided intravenously by Anesthesiology: 412mg  of Propofol, 40mg  of Lidocaine. The patient's vital signs; including heart rate, blood pressure, and oxygen saturation; remained stable throughout the procedure. The patient developed no complications during the procedure. IMPRESSIONS  1. Left ventricular ejection fraction, by estimation, is 55 to 60%. The left ventricle has normal function. The left ventricle has no regional wall motion abnormalities. There is mild concentric left ventricular hypertrophy. Left ventricular diastolic function could not be evaluated.  2. Right ventricular systolic function is normal. The right ventricular size is normal.  3. No left atrial/left atrial appendage thrombus was detected.  4. There is a large (1.04 cm x 1.54 cm) round, well-circumscribed echodensity on the anterior (A2) mitral valve leaflet. There is also a mobile, filamentous lesion on the posterior (P1) leaflet (1.46 cm x 0.33 cm). Both are new since the TEE in 2019. Given the well-circumscribed nature of the anterior leaflet abnormality, suspect this may reflection prior  endocarditis. The filamentous posterior leaflet finding is more consistent with acute endocarditis. The mitral valve is normal in structure and function. Mild mitral valve regurgitation. No evidence of mitral stenosis.  5. No evidence of aortic valve endocarditis. The aortic valve is normal in structure and function. Aortic valve regurgitation is mild to moderate. No aortic stenosis is present.  6. The inferior vena cava is normal in size with greater than 50% respiratory variability, suggesting right atrial pressure of 3 mmHg. Conclusion(s)/Recommendation(s): Normal biventricular function without evidence of hemodynamically significant valvular heart disease. Findings are concerning for vegetation/infective endocarditis as detailed above. FINDINGS  Left Ventricle: Left ventricular ejection fraction, by estimation, is 55 to 60%. The left ventricle has normal function. The left ventricle has no regional wall motion abnormalities. The left ventricular internal cavity size was normal in size. There is  mild concentric left ventricular hypertrophy. Right Ventricle: The right ventricular size is normal. No increase in right ventricular wall thickness. Right ventricular systolic function is normal. Left Atrium: Left atrial size was normal in size. No left atrial/left atrial appendage thrombus was detected. Right Atrium: Right atrial size was normal in size. Pericardium: There is no evidence of pericardial effusion. Mitral Valve: There is a large (1.04 cm x 1.54 cm) round, well-circumscribed echodensity on the anterior (A2) mitral valve leaflet. There is also a mobile, filamentous lesion on the posterior (P1) leaflet (1.46 cm x 0.33 cm). Both are new since the TEE in 2019. Given the well-circumscribed nature of the anterior leaflet abnormality, suspect this may reflection prior endocarditis. The filamentous posterior leaflet finding is more consistent with acute endocarditis. The mitral valve is normal in structure and  function. Normal mobility of the mitral valve leaflets. Mild mitral valve regurgitation. No evidence of mitral valve stenosis. MV peak gradient, 2.9 mmHg. The mean mitral valve gradient is 1.0 mmHg. Tricuspid Valve: The tricuspid valve is  normal in structure. Tricuspid valve regurgitation is not demonstrated. No evidence of tricuspid stenosis. Aortic Valve: No evidence of aortic valve endocarditis. The aortic valve is normal in structure and function. Aortic valve regurgitation is mild to moderate. Aortic regurgitation PHT measures 811 msec. No aortic stenosis is present. Pulmonic Valve: The pulmonic valve was normal in structure. Pulmonic valve regurgitation is not visualized. No evidence of pulmonic stenosis. Aorta: The aortic root is normal in size and structure. Venous: The inferior vena cava is normal in size with greater than 50% respiratory variability, suggesting right atrial pressure of 3 mmHg. IAS/Shunts: No atrial level shunt detected by color flow Doppler.  AORTIC VALVE AI PHT:      811 msec MITRAL VALVE MV Peak grad: 2.9 mmHg MV Mean grad: 1.0 mmHg MV Vmax:      0.84 m/s MV Vmean:     51.0 cm/s Skeet Latch MD Electronically signed by Skeet Latch MD Signature Date/Time: 05/12/2019/2:30:37 PM    Final    VAS [place] LOWER EXTREMITY VENOUS (DVT)  Result Date: 05/12/2019  Lower Venous DVTStudy Indications: Pain, and Swelling. Other Indications: IVDU. Comparison Study: No prior study Performing Technologist: Maudry Mayhew MHA, RDMS, RVT, RDCS  Examination Guidelines: A complete evaluation includes B-mode imaging, spectral Doppler, color Doppler, and power Doppler as needed of all accessible portions of each vessel. Bilateral testing is considered an integral part of a complete examination. Limited examinations for reoccurring indications may be performed as noted. The reflux portion of the exam is performed with the patient in reverse Trendelenburg.   +---------+---------------+---------+-----------+----------+--------------+ RIGHT    CompressibilityPhasicitySpontaneityPropertiesThrombus Aging +---------+---------------+---------+-----------+----------+--------------+ CFV      Full           Yes      Yes                                 +---------+---------------+---------+-----------+----------+--------------+ SFJ      Full                                                        +---------+---------------+---------+-----------+----------+--------------+ FV Prox  Full                                                        +---------+---------------+---------+-----------+----------+--------------+ FV Mid   Full                                                        +---------+---------------+---------+-----------+----------+--------------+ FV DistalFull                                                        +---------+---------------+---------+-----------+----------+--------------+ PFV      Full                                                        +---------+---------------+---------+-----------+----------+--------------+  POP      Full           Yes      Yes                                 +---------+---------------+---------+-----------+----------+--------------+ PTV      Full                                                        +---------+---------------+---------+-----------+----------+--------------+ PERO     Full                                                        +---------+---------------+---------+-----------+----------+--------------+ Gastroc  Full                    Yes                                 +---------+---------------+---------+-----------+----------+--------------+   +----+---------------+---------+-----------+----------+--------------+ LEFTCompressibilityPhasicitySpontaneityPropertiesThrombus Aging  +----+---------------+---------+-----------+----------+--------------+ CFV Full           Yes      Yes                                 +----+---------------+---------+-----------+----------+--------------+     Summary: RIGHT: - There is no evidence of deep vein thrombosis in the lower extremity.  - No cystic structure found in the popliteal fossa. - Ultrasound characteristics of enlarged lymph nodes are noted in the groin.  LEFT: - No evidence of common femoral vein obstruction. - Ultrasound characteristics of enlarged lymph nodes noted in the groin.  *See table(s) above for measurements and observations. Electronically signed by Ruta Hinds MD on 05/12/2019 at 11:49:21 AM.    Final    [place] EKG SITE RITE  Result Date: 05/12/2019 If [facility] image not attached, placement could not be confirmed due to current cardiac rhythm.  Scheduled Meds: . amLODipine  5 mg Oral Daily  . atorvastatin  20 mg Oral Daily  . buprenorphine-naloxone  1 tablet Sublingual BID  . Chlorhexidine Gluconate Cloth  6 each Topical Daily  . DULoxetine  30 mg Oral Daily  . enoxaparin (LOVENOX) injection  40 mg Subcutaneous Q24H  . gabapentin  400 mg Oral TID  . insulin aspart  0-5 Units Subcutaneous QHS  . insulin aspart  0-9 Units Subcutaneous TID WC  . insulin aspart  8 Units Subcutaneous TID WC  . [START ON 05/14/2019] insulin glargine  30 Units Subcutaneous Daily  . levothyroxine  50 mcg Oral Q0600  . pantoprazole  40 mg Oral Daily  . sertraline  50 mg Oral Daily  . sodium chloride flush  10-40 mL Intracatheter Q12H   Continuous Infusions: . gentamicin 210 mg (05/12/19 2103)  . penicillin g continuous IV infusion 12 Million Units (05/13/19 1135)     LOS: 4 days   Kerney Elbe, DO [facility] PAGER is on [place]  If 7PM-7AM, please contact night-coverage [email or web address]

## 2019-05-14 ENCOUNTER — Other Ambulatory Visit: Payer: Self-pay | Admitting: Nurse Practitioner

## 2019-05-14 DIAGNOSIS — L03115 Cellulitis of right lower limb: Secondary | ICD-10-CM

## 2019-05-14 LAB — CBC WITH DIFFERENTIAL/PLATELET
Abs Immature Granulocytes: 0.11 10*3/uL — ABNORMAL HIGH (ref 0.00–0.07)
Basophils Absolute: 0.1 10*3/uL (ref 0.0–0.1)
Basophils Relative: 1 %
Eosinophils Absolute: 0.3 10*3/uL (ref 0.0–0.5)
Eosinophils Relative: 2 %
HCT: 30.5 % — ABNORMAL LOW (ref 39.0–52.0)
Hemoglobin: 9.6 g/dL — ABNORMAL LOW (ref 13.0–17.0)
Immature Granulocytes: 1 %
Lymphocytes Relative: 29 %
Lymphs Abs: 4.3 10*3/uL — ABNORMAL HIGH (ref 0.7–4.0)
MCH: 23.4 pg — ABNORMAL LOW (ref 26.0–34.0)
MCHC: 31.5 g/dL (ref 30.0–36.0)
MCV: 74.2 fL — ABNORMAL LOW (ref 80.0–100.0)
Monocytes Absolute: 1.5 10*3/uL — ABNORMAL HIGH (ref 0.1–1.0)
Monocytes Relative: 10 %
Neutro Abs: 8.5 10*3/uL — ABNORMAL HIGH (ref 1.7–7.7)
Neutrophils Relative %: 57 %
Platelets: 552 10*3/uL — ABNORMAL HIGH (ref 150–400)
RBC: 4.11 MIL/uL — ABNORMAL LOW (ref 4.22–5.81)
RDW: 13.8 % (ref 11.5–15.5)
WBC: 14.9 10*3/uL — ABNORMAL HIGH (ref 4.0–10.5)
nRBC: 0 % (ref 0.0–0.2)

## 2019-05-14 LAB — COMPREHENSIVE METABOLIC PANEL
ALT: 23 U/L (ref 0–44)
AST: 21 U/L (ref 15–41)
Albumin: 2.6 g/dL — ABNORMAL LOW (ref 3.5–5.0)
Alkaline Phosphatase: 132 U/L — ABNORMAL HIGH (ref 38–126)
Anion gap: 9 (ref 5–15)
BUN: 16 mg/dL (ref 6–20)
CO2: 27 mmol/L (ref 22–32)
Calcium: 8.9 mg/dL (ref 8.9–10.3)
Chloride: 100 mmol/L (ref 98–111)
Creatinine, Ser: 1.31 mg/dL — ABNORMAL HIGH (ref 0.61–1.24)
GFR calc Af Amer: >60 mL/min (ref >60)
GFR calc non Af Amer: >60 mL/min (ref >60)
Glucose, Bld: 256 mg/dL — ABNORMAL HIGH (ref 70–99)
Potassium: 4 mmol/L (ref 3.5–5.1)
Sodium: 136 mmol/L (ref 135–145)
Total Bilirubin: 0.4 mg/dL (ref 0.3–1.2)
Total Protein: 6.6 g/dL (ref 6.5–8.1)

## 2019-05-14 LAB — HCV RNA QUANT
HCV Quantitative Log: 6.713 log10 IU/mL (ref >1.70)
HCV Quantitative: 5170000 IU/mL (ref >50)

## 2019-05-14 LAB — GLUCOSE, CAPILLARY
Glucose-Capillary: 197 mg/dL — ABNORMAL HIGH (ref 70–99)
Glucose-Capillary: 478 mg/dL — ABNORMAL HIGH (ref 70–99)
Glucose-Capillary: 329 mg/dL — ABNORMAL HIGH (ref 70–99)
Glucose-Capillary: 410 mg/dL — ABNORMAL HIGH (ref 70–99)

## 2019-05-14 LAB — PHOSPHORUS: Phosphorus: 4.2 mg/dL (ref 2.5–4.6)

## 2019-05-14 LAB — MAGNESIUM: Magnesium: 1.8 mg/dL (ref 1.7–2.4)

## 2019-05-14 MED ORDER — INSULIN ASPART 100 UNIT/ML ~~LOC~~ SOLN
10.0000 [IU] | Freq: Three times a day (TID) | SUBCUTANEOUS | Status: DC
  Administered 2019-05-14 – 2019-05-16 (×5): 10 [IU] via SUBCUTANEOUS

## 2019-05-14 MED ORDER — INSULIN GLARGINE 100 UNIT/ML ~~LOC~~ SOLN
36.0000 [IU] | Freq: Every day | SUBCUTANEOUS | Status: DC
  Administered 2019-05-15: 36 [IU] via SUBCUTANEOUS
  Filled 2019-05-14: qty 0.36

## 2019-05-14 MED ORDER — INSULIN ASPART 100 UNIT/ML ~~LOC~~ SOLN
7.0000 [IU] | Freq: Once | SUBCUTANEOUS | Status: AC
  Administered 2019-05-14: 22:00:00 7 [IU] via SUBCUTANEOUS

## 2019-05-14 NOTE — Progress Notes (Signed)
PROGRESS NOTE    Luke [name]  [id number] DOB: 05/22/1980 DOA: 05/08/2019 PCP: Gildardo Pounds, NP   Brief Narrative:  Luke [name] a 39 y.o.malewith a history of polysubstance use, IVDU with heroin, DM1 and DKA, HTN, prior osteomyelitis of the ankle s/p left great toe amputationwho was admittedfor bacteremia. Had recent positive strep viridans blood cultures on 12/31 during recent admission but patient left AMA. Lost to follow up until he initiated a telehealth visit for recurrent skin infections on 2/1, then on 2/14 was notified that he had positive blood cultures from 12/31 and was told to go to the ER. Began having chest pain and shortness of breath prompting his admission on 2/20. started on empiric vancomycin/cefepime.    2/21 TTE showed AV/MV vegetations, infectious disease and cardiology consulted. 2/23: TEE done and showed an EF of 65% from large, cystlike structure on the A2 segment and mobile, filamentous structure in the P1 region of the mitral valve.  Which was new compared to the findings on the TEE back in 11/2017.  2/24 Cardiothoracic surgery was consulted and patient had a PICC line placed yesterday.  ID has changed him to gentamicin and penicillin G and will continue antibiotic recommendations per ID.  2/25: Today the patient had no complaints and was happy that he did not need surgical intervention at this time therapy and reassess the bowel function 6 weeks and after completion of drug rehab with an echocardiogram.  Patient's blood sugars were uncontrolled so his regimen is being adjusted.  ID recommends continuing current synergistic treatment with gentamicin and penicillin.  Patient's Panorex did show a lucency in the right tooth and right maxilla.  Assessment & Plan:   Principal Problem:   Sepsis ([place]) Active Problems:   Drug abuse, IV ([place])   Essential hypertension   CKD (chronic kidney disease) stage 3, GFR 30-59 ml/min   Cellulitis   Diabetic  polyneuropathy associated with type 1 diabetes mellitus ([name])   Hepatitis C   Hypothyroidism   Heroin abuse ([place])   Acute bacterial endocarditis   Sepsis Secondary to 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, in setting of IVDU sepsis resolved -Hx of strep viridans bacteremia diagnosed 03/19/19, left AMA and was not treated -WBC at started increasing but now slightly decreased and is down from 20,000 and now is 14,900 -ID gave oritavancin x1 and changed to ceftriaxone and now is on Gentamicin 210 mg q24h and Pen G and will need 2 weeks of it; will need close monitoring of his renal function his creatinine is elevated slightly today at 131 -Asked TCTS to evaluate given his Endocarditis and Moderate Mitral Regurgitation they recommend medical management and conservative therapy at this point and repeating echocardiogram at the end of therapy as well as completion of a drug rehab -TEE showed a large cystlike structure on the A2 segment mobile filamentous on the A2 segment and mobile on the P1 region of the mitral valve along with moderate mitral regurg and no mitral stenosis.  No evidence of endocarditis of the aortic valve and no LA/LAA thrombus or mass.  There is no ASD or PFO noted. -Appreciate further ID recommendations and there ordering an orthopantogram given that the patient told him that he was having dental pain but he denied anything to me -His orthopantogram showed "Probable dental carie involving tooth #12 in the right maxilla.  No obvious periapical lucency." -May need to to reach out to in-house dentistry Dr. Darrick Grinder for further evaluation  while patient is hospitalized here -They feel that the patient's persistent leukocytosis may be related to the large vegetation described with some altered negative seated area that we have yet to realize -ID has ordered a AP chest x-ray for evaluation of possible septic emboli and there is no tricuspid  valve involvement in the TEE so they are deferring CT for now; his repeat chest x-ray showed no acute cardiopulmonary process  Bilateral upper extremity wounds present on admission, likely secondary to IVDU -Leading to difficulty of IV access -Wound care -PICC line is now in place  DM1 with episode of Hypoglycemia/Hyperglycemia  -He has had several episodes of hypoglycemia and hyperglycemia and his blood sugars have been extremely variable -HA1c in December 10.0 -He had missed his Lantus for 2 days and unclear why but this is been restarted and he is on Lantus 30 units now which is being increased to 36 units and have increased his mealtime insulin to 10 units 3 times daily with meals and also change to sliding scale to sensitive NovoLog sliding scale before meals and at bedtime -Continue to monitor CBGs carefully and adjust as necessary -CBGs have been fluctuating went from 61-512 yesterday and now blood sugars are still elevated and ranging from 197-535  AKI on CKD 3 -Currently 2.03 on admission, currently at baseline and improved -She has BUN/creatinine is now slightly up from yesterday and is 16/1.31 and yesterday it was 16/1.25 and the day before yesterday it was 17/1.19 -Continue Holding lisinopril -Continue to Monitor and Trend Renal Fxn now that he is getting gentamicin for antibiotics -Repeat CMP in AM   Hypothyroidism -Stable -C/w Levothyroxine 50 mcg po Daily   HTN -Continue Amlodipine 5 mg po Daily  -Holding Lisinopril for now  Right lower extremity calf tenderness/swelling -RLE [place]: No DVT, bilateral groin lymphadenopathy noted -? Cause of his Leukocytosis -May consider further imaging   IVDU -Wishes to quit -Started on Suboxone this admission by prior Physician and is now on Buprenorphine-Naloxone 8-2 mg per SL 1 tab SL BID -Follow up with SW  GERD -C/w PPI Pantoprazole 40 mg po Daily   Anxiety  -C/w Sertraline 50 mg po Daily, Duloxetine 400 mg po TID,   HLD  -C/w Atorvastatin 20 mg po Daily   Microcytic Anemia -Stable -Patient's hemoglobin/hematocrit stable is now 9.6/30.5 -Check anemia panel in the a.m. -Continue to monitor for signs and symptoms of bleeding; currently no overt bleeding noted -Repeat CBC in a.m.  Thrombocytosis -Likely reactive in the setting of his infection and antibiotic usage -Patient's platelet count is now 552,000 -Continue to monitor and trend repeat CBC in a.m.  Hepatitis C -The genotype of 1A and RNA level of 10,500,000 back in 2019 -Repeat RNA was 5,170,000 with a HCV quantitative log of 6.713 and his hepatitis B surface antigen was negative nonreactive -Follow-up with ID in the clinic  DVT prophylaxis: Enoxaparin 40 sq q24h Code Status: FULL CODE  Family Communication: No family present at bedside  Disposition Plan: Remain Hospitalized for Inpatient Treatment of his endocarditis Streptococcus bacteremia  Consultants:   Infectious Diseases  Cardiology  TCTS  Procedures:  TEE Brief TEE Note  LVEF 60-65% Large, cyst-like structure on the A2 segment and mobile, filamentous structure on the P1 region of the mitral valve. Findings are new compared to TEE 11/2017. Moderate mitral regurgitation.  No mitral stenosis. No evidence of endocarditis on the aortic valve. No LA/LAA thrombus or mass No ASD or PFO.   Antimicrobials:  Anti-infectives (From  admission, onward)   Start     Dose/Rate Route Frequency Ordered Stop   05/12/19 0500  penicillin G potassium 12 Million Units in dextrose 5 % 500 mL continuous infusion     12 Million Units 41.7 mL/hr over 12 Hours Intravenous Every 12 hours 05/11/19 1604     05/11/19 1700  gentamicin (GARAMYCIN) 210 mg in dextrose 5 % 50 mL IVPB     3 mg/kg  70.7 kg (Ideal) 110.5 mL/hr over 30 Minutes Intravenous Every 24 hours 05/11/19 1618     05/10/19 1400  cefTRIAXone (ROCEPHIN) 2 g in sodium chloride 0.9 % 100 mL IVPB  Status:  Discontinued     2 g 200 mL/hr  over 30 Minutes Intravenous Every 24 hours 05/10/19 1259 05/11/19 1604   05/10/19 1330  Oritavancin Diphosphate (ORBACTIV) 1,200 mg in dextrose 5 % IVPB     1,200 mg 333.3 mL/hr over 180 Minutes Intravenous Once 05/10/19 1317 05/10/19 1935   05/10/19 0845  ceFEPIme (MAXIPIME) 2 g in sodium chloride 0.9 % 100 mL IVPB  Status:  Discontinued     2 g 200 mL/hr over 30 Minutes Intravenous Every 8 hours 05/10/19 0836 05/10/19 1259   05/10/19 0000  vancomycin (VANCOREADY) IVPB 1250 mg/250 mL  Status:  Discontinued     1,250 mg 166.7 mL/hr over 90 Minutes Intravenous Every 24 hours 05/09/19 0031 05/10/19 1317   05/09/19 2200  ceFEPIme (MAXIPIME) 2 g in sodium chloride 0.9 % 100 mL IVPB  Status:  Discontinued     2 g 200 mL/hr over 30 Minutes Intravenous Every 12 hours 05/09/19 1041 05/10/19 0836   05/09/19 0800  ceFEPIme (MAXIPIME) 2 g in sodium chloride 0.9 % 100 mL IVPB  Status:  Discontinued     2 g 200 mL/hr over 30 Minutes Intravenous Every 8 hours 05/09/19 0012 05/09/19 1041   05/09/19 0015  ceFEPIme (MAXIPIME) 2 g in sodium chloride 0.9 % 100 mL IVPB     2 g 200 mL/hr over 30 Minutes Intravenous  Once 05/09/19 0004 05/09/19 0130   05/09/19 0015  metroNIDAZOLE (FLAGYL) IVPB 500 mg     500 mg 100 mL/hr over 60 Minutes Intravenous  Once 05/09/19 0004 05/09/19 0150   05/09/19 0015  vancomycin (VANCOCIN) IVPB 1000 mg/200 mL premix  Status:  Discontinued     1,000 mg 200 mL/hr over 60 Minutes Intravenous  Once 05/09/19 0004 05/09/19 0010   05/09/19 0015  vancomycin (VANCOREADY) IVPB 1500 mg/300 mL     1,500 mg 150 mL/hr over 120 Minutes Intravenous  Once 05/09/19 0010 05/09/19 0351     Subjective: Seen and examined at bedside and he states he is doing well slept well.  Still has some back pain.  No nausea or vomiting.  No other concerns or complaints at this time.  Objective: Vitals:   05/13/19 1411 05/13/19 2200 05/14/19 0548 05/14/19 1603  BP: 133/90 137/84 140/87 (!) 150/97  Pulse:  94 97 87 95  Resp: 18 18 15 18   Temp: 98.6 F (37 C) 98.8 F (37.1 C) 98.1 F (36.7 C) 98.6 F (37 C)  TempSrc: Oral Oral Oral Oral  SpO2: 99% 99% 100% 100%  Weight:      Height:        Intake/Output Summary (Last 24 hours) at 05/14/2019 1630 Last data filed at 05/14/2019 1300 Gross per 24 hour  Intake 320 ml  Output 3075 ml  Net -2755 ml   Filed Weights   05/09/19 0400  05/12/19 1118  Weight: 80 kg 81.6 kg   Examination: Physical Exam:  Constitutional: WN/WD [place] male currently in no acute distress appears calm and comfortable lying in the bed watching television Eyes: Lids and conjunctivae normal, sclerae anicteric  ENMT: External Ears, Nose appear normal. Grossly normal hearing. MMM Neck: Appears normal, supple, no cervical masses, normal ROM, no appreciable thyromegaly Respiratory: Slightly diminished to auscultation bilaterally, no wheezing, rales, rhonchi or crackles. Normal respiratory effort and patient is not tachypenic. No accessory muscle use.  Cardiovascular: RRR, has a 2 out of 6 systolic murmur Abdomen: Soft, non-tender, distended secondary body habitus. Bowel sounds positive.  GU: Deferred. Musculoskeletal: No clubbing / cyanosis of digits/nails.  Left great toe amputation Skin: Bilateral upper arm scarring keloids due to his prior drug abuse Neurologic: CN 2-12 grossly intact with no focal deficits. Romberg sign and cerebellar reflexes not assessed.  Psychiatric: Normal judgment and insight. Alert and oriented x 3. Normal mood and appropriate affect.   Data Reviewed: I have personally reviewed following labs and imaging studies  CBC: Recent Labs  Lab 05/08/19 2210 05/09/19 0634 05/10/19 0650 05/11/19 0321 05/12/19 0130 05/13/19 0430 05/14/19 0435  WBC 20.1*   < > 16.4* 16.8* 16.5* 16.5* 14.9*  NEUTROABS 13.7*  --   --   --   --   --  8.5*  HGB 10.1*   < > 10.7* 10.4* 10.0* 9.9* 9.6*  HCT 32.0*   < > 33.9* 32.1* 31.3* 32.0* 30.5*  MCV 75.3*   <  > 75.0* 72.3* 73.1* 75.5* 74.2*  PLT 592*   < > 520* 532* 534* 592* 552*   < > = values in this interval not displayed.   Basic Metabolic Panel: Recent Labs  Lab 05/10/19 0650 05/11/19 0321 05/12/19 0130 05/13/19 0430 05/14/19 0435  NA 134* 131* 135 138 136  K 4.0 3.9 4.1 3.8 4.0  CL 102 98 102 103 100  CO2 22 23 22 26 27   GLUCOSE 49* 256* 141* 85 256*  BUN 12 12 17 16 16   CREATININE 1.15 1.23 1.19 1.25* 1.31*  CALCIUM 8.9 8.7* 8.7* 8.8* 8.9  MG  --   --   --   --  1.8  PHOS  --   --   --   --  4.2   GFR: Estimated Creatinine Clearance: 75.7 mL/min (A) (by C-G formula based on SCr of 1.31 mg/dL (H)). Liver Function Tests: Recent Labs  Lab 05/08/19 2210 05/14/19 0435  AST 21 21  ALT 17 23  ALKPHOS 136* 132*  BILITOT 0.4 0.4  PROT 7.7 6.6  ALBUMIN 2.9* 2.6*   No results for input(s): LIPASE, AMYLASE in the last 168 hours. No results for input(s): AMMONIA in the last 168 hours. Coagulation Profile: Recent Labs  Lab 05/08/19 2210  INR 1.0   Cardiac Enzymes: No results for input(s): CKTOTAL, CKMB, CKMBINDEX, TROPONINI in the last 168 hours. BNP (last 3 results) No results for input(s): PROBNP in the last 8760 hours. HbA1C: No results for input(s): HGBA1C in the last 72 hours. CBG: Recent Labs  Lab 05/13/19 1818 05/13/19 2158 05/13/19 2334 05/14/19 0805 05/14/19 1228  GLUCAP 276* 453* 446* 197* 478*   Lipid Profile: No results for input(s): CHOL, HDL, LDLCALC, TRIG, CHOLHDL, LDLDIRECT in the last 72 hours. Thyroid Function Tests: No results for input(s): TSH, T4TOTAL, FREET4, T3FREE, THYROIDAB in the last 72 hours. Anemia Panel: No results for input(s): VITAMINB12, FOLATE, FERRITIN, TIBC, IRON, RETICCTPCT in the last 72  hours. Sepsis Labs: Recent Labs  Lab 05/08/19 2211 05/09/19 0040  LATICACIDVEN 2.2* 1.6    Recent Results (from the past 240 hour(s))  Culture, blood (Routine x 2)     Status: Abnormal   Collection Time: 05/08/19 10:00 PM    Specimen: BLOOD RIGHT HAND  Result Value Ref Range Status   Specimen Description BLOOD RIGHT HAND  Final   Special Requests   Final    BOTTLES DRAWN AEROBIC ONLY Blood Culture adequate volume   Culture  Setup Time   Final    AEROBIC BOTTLE ONLY GRAM POSITIVE COCCI CRITICAL RESULT CALLED TO, READ BACK BY AND VERIFIED WITH: PHRMD FEAY LORA AT [id number] BY MESSAN [place] ON 05/10/2019 Performed at [facility], [place] [address], [place], [place] [address]    Culture STREPTOCOCCUS MITIS/ORALIS (A)  Final   Report Status 05/11/2019 FINAL  Final   Organism ID, Bacteria STREPTOCOCCUS MITIS/ORALIS  Final      Susceptibility   Streptococcus mitis/oralis - MIC*    TETRACYCLINE 0.5 SENSITIVE Sensitive     VANCOMYCIN 0.5 SENSITIVE Sensitive     CLINDAMYCIN <=0.25 SENSITIVE Sensitive     PENICILLIN Value in next row Sensitive      SENSITIVE<=0.06    CEFTRIAXONE Value in next row Sensitive      SENSITIVE<=0.12    * STREPTOCOCCUS MITIS/ORALIS  Blood Culture ID Panel (Reflexed)     Status: Abnormal   Collection Time: 05/08/19 10:00 PM  Result Value Ref Range Status   Enterococcus species NOT DETECTED NOT DETECTED Final   Listeria monocytogenes NOT DETECTED NOT DETECTED Final   Staphylococcus species NOT DETECTED NOT DETECTED Final   Staphylococcus aureus (BCID) NOT DETECTED NOT DETECTED Final   Streptococcus species DETECTED (A) NOT DETECTED Final    Comment: Not Enterococcus species, Streptococcus agalactiae, Streptococcus pyogenes, or Streptococcus pneumoniae. CRITICAL RESULT CALLED TO, READ BACK BY AND VERIFIED WITH: PHRMD FEAY LORA AT [id number] BY Zeb ON 05/10/2019    Streptococcus agalactiae NOT DETECTED NOT DETECTED Final   Streptococcus pneumoniae NOT DETECTED NOT DETECTED Final   Streptococcus pyogenes NOT DETECTED NOT DETECTED Final   Acinetobacter baumannii NOT DETECTED NOT DETECTED Final   Enterobacteriaceae species NOT DETECTED NOT DETECTED Final   Enterobacter cloacae  complex NOT DETECTED NOT DETECTED Final   Escherichia coli NOT DETECTED NOT DETECTED Final   Klebsiella oxytoca NOT DETECTED NOT DETECTED Final   Klebsiella pneumoniae NOT DETECTED NOT DETECTED Final   Proteus species NOT DETECTED NOT DETECTED Final   Serratia marcescens NOT DETECTED NOT DETECTED Final   Haemophilus influenzae NOT DETECTED NOT DETECTED Final   Neisseria meningitidis NOT DETECTED NOT DETECTED Final   Pseudomonas aeruginosa NOT DETECTED NOT DETECTED Final   Candida albicans NOT DETECTED NOT DETECTED Final   Candida glabrata NOT DETECTED NOT DETECTED Final   Candida krusei NOT DETECTED NOT DETECTED Final   Candida parapsilosis NOT DETECTED NOT DETECTED Final   Candida tropicalis NOT DETECTED NOT DETECTED Final    Comment: Performed at [facility], [place] [address] [place], [place] [address]  Culture, blood (Routine x 2)     Status: Abnormal   Collection Time: 05/08/19 10:12 PM   Specimen: BLOOD RIGHT ARM  Result Value Ref Range Status   Specimen Description BLOOD RIGHT ARM  Final   Special Requests   Final    BOTTLES DRAWN AEROBIC AND ANAEROBIC Blood Culture adequate volume   Culture  Setup Time   Final  GRAM POSITIVE COCCI IN BOTH AEROBIC AND ANAEROBIC BOTTLES CRITICAL RESULT CALLED TO, READ BACK BY AND VERIFIED WITH: PHARMD JESSICA BARNEY @1838  05/09/19 AKT    Culture (A)  Final    STREPTOCOCCUS MITIS/ORALIS SUSCEPTIBILITIES PERFORMED ON PREVIOUS CULTURE WITHIN THE LAST 5 DAYS. Performed at [facility], [place] [address] [place], [place] [address]    Report Status 05/11/2019 FINAL  Final  Urine culture     Status: None   Collection Time: 05/09/19 12:03 AM   Specimen: In/Out Cath Urine  Result Value Ref Range Status   Specimen Description IN/OUT CATH URINE  Final   Special Requests NONE  Final   Culture   Final    NO GROWTH Performed at [facility], [place] [address], [place], [place] [address]    Report Status 05/10/2019 FINAL  Final  SARS  CORONAVIRUS 2 (TAT 6-24 HRS) Nasopharyngeal Nasopharyngeal Swab     Status: None   Collection Time: 05/09/19  1:24 AM   Specimen: Nasopharyngeal Swab  Result Value Ref Range Status   SARS Coronavirus 2 NEGATIVE NEGATIVE Final    Comment: (NOTE) SARS-CoV-2 target nucleic acids are NOT DETECTED. The SARS-CoV-2 RNA is generally detectable in upper and lower respiratory specimens during the acute phase of infection. Negative results do not preclude SARS-CoV-2 infection, do not rule out co-infections with other pathogens, and should not be used as the sole basis for treatment or other patient management decisions. Negative results must be combined with clinical observations, patient history, and epidemiological information. The expected result is Negative. Fact Sheet for Patients: SugarRoll.be Fact Sheet for Healthcare Providers: https://www.woods-mathews.com/ This test is not yet approved or cleared by the [place] FDA and  has been authorized for detection and/or diagnosis of SARS-CoV-2 by FDA under an Emergency Use Authorization (EUA). This EUA will remain  in effect (meaning this test can be used) for the duration of the COVID-19 declaration under Section 56 4(b)(1) of the Act, 21 U.S.C. section 360bbb-3(b)(1), unless the authorization is terminated or revoked sooner. Performed at [facility], [place] [address] [place], [place] [address]   Culture, blood (Routine X 2) w Reflex to ID Panel     Status: None (Preliminary result)   Collection Time: 05/10/19  3:22 PM   Specimen: BLOOD  Result Value Ref Range Status   Specimen Description BLOOD LEFT ARM  Final   Special Requests   Final    BOTTLES DRAWN AEROBIC ONLY Blood Culture adequate volume Performed at [facility], [place] [address], [place], [place] [address]    Culture NO GROWTH 4 DAYS  Final   Report Status PENDING  Incomplete  Culture, blood (Routine X 2) w Reflex to ID  Panel     Status: None (Preliminary result)   Collection Time: 05/10/19  3:29 PM   Specimen: BLOOD  Result Value Ref Range Status   Specimen Description BLOOD LEFT ARM  Final   Special Requests   Final    BOTTLES DRAWN AEROBIC ONLY Blood Culture adequate volume Performed at [facility], [place] [address] [place], [place] [address]    Culture NO GROWTH 4 DAYS  Final   Report Status PENDING  Incomplete  MRSA PCR Screening     Status: None   Collection Time: 05/11/19  5:25 PM   Specimen: Nasal Mucosa; Nasopharyngeal  Result Value Ref Range Status   MRSA by PCR NEGATIVE NEGATIVE Final    Comment:  The GeneXpert MRSA Assay (FDA approved for NASAL specimens only), is one component of a comprehensive MRSA colonization surveillance program. It is not intended to diagnose MRSA infection nor to guide or monitor treatment for MRSA infections. Performed at [facility], [place] [address], [place], [place] [address]     RN Pressure Injury Documentation:     Estimated body mass index is 26.58 kg/m as calculated from the following:   Height as of this encounter: 5\' 9"  (1.753 m).   Weight as of this encounter: 81.6 kg.  Malnutrition Type:      Malnutrition Characteristics:      Nutrition Interventions:      Radiology Studies: DG Orthopantogram  Result Date: 05/13/2019 CLINICAL DATA:  Endocarditis of mitral valve. EXAM: ORTHOPANTOGRAM/PANORAMIC COMPARISON:  None. FINDINGS: Lucencies are noted involving tooth #12 involving the right maxilla. No obvious periapical lucencies. The patient is status post prior multiple tooth extraction. IMPRESSION: 1. Probable dental carie involving tooth #12 in the right maxilla. 2. No obvious periapical lucency. Electronically Signed   By: Constance Holster M.D.   On: 05/13/2019 15:12   DG Chest 2 View  Result Date: 05/13/2019 CLINICAL DATA:  Endocarditis. EXAM: CHEST - 2 VIEW COMPARISON:  05/08/2019 FINDINGS: The heart size and  mediastinal contours are within normal limits. Both lungs are clear. The visualized skeletal structures are unremarkable. There is a newly placed right-sided PICC line with tip terminating near the cavoatrial junction. IMPRESSION: No active cardiopulmonary disease. Electronically Signed   By: Constance Holster M.D.   On: 05/13/2019 15:13   Scheduled Meds: . amLODipine  5 mg Oral Daily  . atorvastatin  20 mg Oral Daily  . buprenorphine-naloxone  1 tablet Sublingual BID  . Chlorhexidine Gluconate Cloth  6 each Topical Daily  . DULoxetine  30 mg Oral Daily  . enoxaparin (LOVENOX) injection  40 mg Subcutaneous Q24H  . gabapentin  400 mg Oral TID  . insulin aspart  0-5 Units Subcutaneous QHS  . insulin aspart  0-9 Units Subcutaneous TID WC  . insulin aspart  10 Units Subcutaneous TID WC  . [START ON 05/15/2019] insulin glargine  36 Units Subcutaneous Daily  . levothyroxine  50 mcg Oral Q0600  . pantoprazole  40 mg Oral Daily  . sertraline  50 mg Oral Daily  . sodium chloride flush  10-40 mL Intracatheter Q12H   Continuous Infusions: . gentamicin 210 mg (05/13/19 2004)  . penicillin g continuous IV infusion 12 Million Units (05/14/19 0902)     LOS: 5 days   Kerney Elbe, DO [facility] PAGER is on [place]  If 7PM-7AM, please contact night-coverage [email or web address]

## 2019-05-14 NOTE — Progress Notes (Signed)
Inpatient Diabetes Program Recommendations  AACE/ADA: New Consensus Statement on Inpatient Glycemic Control (2015)  Target Ranges:  Prepandial:   less than 140 mg/dL      Peak postprandial:   less than 180 mg/dL (1-2 hours)      Critically ill patients:  140 - 180 mg/dL   Lab Results  Component Value Date   GLUCAP 197 (H) 05/14/2019   HGBA1C 10.0 (H) 03/19/2019    Review of Glycemic Control Results for AVRI, PAOLICELLI (MRN [id number]) as of 05/14/2019 09:42  Ref. Range 05/13/2019 16:10 05/13/2019 18:18 05/13/2019 21:58 05/13/2019 23:34 05/14/2019 08:05  Glucose-Capillary Latest Ref Range: 70 - 99 mg/dL 535 (HH) 276 (H) 453 (H) 446 (H) 197 (H)  DM 1 Outpatient Diabetes medications: Lantus 40 units bid, Humalog 4-20 units tid with meals Current orders for Inpatient glycemic control: Lantus 30 units qd, Novolog sensitive tid with meals & QHS, Novolog 8 units tid with meals Novolog 14 units x 1  Inpatient Diabetes Program Recommendations:    Patient trends continue to remain variable and patient received a total of 62 units of short acting insulin.  Would recommend increasing Lantus to 36 units QD and meal coverage to Novolog 10 units TID (assuming patient is consuming >50% of meal).  Thanks, Bronson Curb, MSN, RNC-OB Diabetes Coordinator [phone] (8a-5p)

## 2019-05-14 NOTE — Progress Notes (Signed)
Luke [name] for Infectious Disease    Date of Admission:  05/08/2019   Total days of antibiotics 5           ID: Luke [name] is a 39 y.o. male with hx of IVDU, found to have streptococcal native mitral valve endocarditis Principal Problem:   Sepsis ([place]) Active Problems:   Hepatitis C   Drug abuse, IV ([place])   Essential hypertension   CKD (chronic kidney disease) stage 3, GFR 30-59 ml/min   Cellulitis   Diabetic polyneuropathy associated with type 1 diabetes mellitus ([name])   Hypothyroidism   Heroin abuse ([place])   Acute bacterial endocarditis    Subjective: Doing well overall. No other concerns today. Slept well last night and walking out in the hallways.   ROS: 12 point ros is negative. No fever, chills, nightsweats, PICC clean and functioning well.  No headaches, vision changes or abdominal pain   Medications:  . amLODipine  5 mg Oral Daily  . atorvastatin  20 mg Oral Daily  . buprenorphine-naloxone  1 tablet Sublingual BID  . Chlorhexidine Gluconate Cloth  6 each Topical Daily  . DULoxetine  30 mg Oral Daily  . enoxaparin (LOVENOX) injection  40 mg Subcutaneous Q24H  . gabapentin  400 mg Oral TID  . insulin aspart  0-5 Units Subcutaneous QHS  . insulin aspart  0-9 Units Subcutaneous TID WC  . insulin aspart  10 Units Subcutaneous TID WC  . [START ON 05/15/2019] insulin glargine  36 Units Subcutaneous Daily  . levothyroxine  50 mcg Oral Q0600  . pantoprazole  40 mg Oral Daily  . sertraline  50 mg Oral Daily  . sodium chloride flush  10-40 mL Intracatheter Q12H    Objective: Vital signs in last 24 hours: Temp:  [98.1 F (36.7 C)-98.8 F (37.1 C)] 98.1 F (36.7 C) (02/25 0548) Pulse Rate:  [87-97] 87 (02/25 0548) Resp:  [15-18] 15 (02/25 0548) BP: (133-140)/(84-90) 140/87 (02/25 0548) SpO2:  [99 %-100 %] 100 % (02/25 0548)   Physical Exam  Constitutional: He is oriented to person, place, and time. He appears well-developed and well-nourished. No  distress.  HENT: Mouth/Throat: Oropharynx is clear and moist. No oropharyngeal exudate.  Cardiovascular: Normal rate, regular rhythm and +systolic murmur Pulmonary/Chest: Effort normal and breath sounds normal. No respiratory distress. He has no wheezes.  Abdominal: Soft. Bowel sounds are normal. He exhibits no distension. There is no tenderness.  Lymphadenopathy: He has no cervical adenopathy.  Ext: significant keloid scarring to ACs bilaterally; PICC in place with clean/dry dressing Neurological: He is alert and oriented to person, place, and time.  Skin: Skin is warm and dry. No rash noted. No erythema.  Psychiatric: He has a normal mood and affect. His behavior is normal.     Lab Results Recent Labs    05/13/19 0430 05/14/19 0435  WBC 16.5* 14.9*  HGB 9.9* 9.6*  HCT 32.0* 30.5*  NA 138 136  K 3.8 4.0  CL 103 100  CO2 26 27  BUN 16 16  CREATININE 1.25* 1.31*   Liver Panel Recent Labs    05/14/19 0435  PROT 6.6  ALBUMIN 2.6*  AST 21  ALT 23  ALKPHOS 132*  BILITOT 0.4    Microbiology: 2/21 blood cx NGTD at 24hr 2/19 blood cx +  Organism ID, Bacteria STREPTOCOCCUS MITIS/ORALIS   Resulting Agency [facility]  Susceptibility   Streptococcus mitis/oralis    MIC    CEFTRIAXONE  Sensitive1  CLINDAMYCIN <=0.25 SENS... Sensitive    PENICILLIN  Sensitive2    TETRACYCLINE 0.5 SENSITIVE  Sensitive    VANCOMYCIN 0.5 SENSITIVE  Sensitive       Results: Luke Orthopantogram  Result Date: 05/13/2019 CLINICAL DATA:  Endocarditis of mitral valve. EXAM: ORTHOPANTOGRAM/PANORAMIC COMPARISON:  None. FINDINGS: Lucencies are noted involving tooth #12 involving the right maxilla. No obvious periapical lucencies. The patient is status post prior multiple tooth extraction. IMPRESSION: 1. Probable dental carie involving tooth #12 in the right maxilla. 2. No obvious periapical lucency. Electronically Signed   By: Constance [name] M.D.   On: 05/13/2019 15:12   Luke Chest 2  View  Result Date: 05/13/2019 CLINICAL DATA:  Endocarditis. EXAM: CHEST - 2 VIEW COMPARISON:  05/08/2019 FINDINGS: The heart size and mediastinal contours are within normal limits. Both lungs are clear. The visualized skeletal structures are unremarkable. There is a newly placed right-sided PICC line with tip terminating near the cavoatrial junction. IMPRESSION: No active cardiopulmonary disease. Electronically Signed   By: Constance [name] M.D.   On: 05/13/2019 15:13     Assessment/Plan: Streptococcal Native Mitral Valve Endocarditis = Will continue with 2 weeks of treatment with synergistic Gentamicin + Penicillin. TCTS recommending medical management. Will plan surface echo at end of therapy.   Dental Pain = likely course of #1. Panorex revealed lucencies involving tooth #12 in right maxilla; no obvious periapical lucencies. Will need dentistry follow up; unclear as to what his resources are outpatient. ?consult while inpatient  Leukocytosis = improved today. May be related to large vegetation described vs alternative seeded area yet to realize.  2View CXR is clean without any signs of infectious process.   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 Seems comfortable and willing to stay. On Suboxone for now. Hopeful he will be interested in long term treatment.   AKI = Cr slightly elevated @ 1.31 today. Follow closely on Gentamicin --> level planned today. Still within ranges from current admission.   Multiple skin lesions = no changes, doing well.   Vascular Access = PICC in place currently, functioning well.   Chronic Hepatitis C = Genotype 1a, with RNA level 10,500,000 copies on 11-2017. His RNA is 5,170,000 copies. He was not aware he had this and would be interested in treatment after current infection is complete. Will ensure he has outpatient follow up arranged.  He could use Hep B vaccines also. Hep  B sAg pending.    Luke Madeira, MSN, NP-C [facility] for Infectious Disease [email or web address] Pager: [phone] Office: [place]: [phone]  05/14/2019, 2:04 PM

## 2019-05-15 ENCOUNTER — Inpatient Hospital Stay (HOSPITAL_COMMUNITY): Payer: Commercial Managed Care - PPO

## 2019-05-15 LAB — COMPREHENSIVE METABOLIC PANEL
ALT: 29 U/L (ref 0–44)
AST: 27 U/L (ref 15–41)
Albumin: 2.4 g/dL — ABNORMAL LOW (ref 3.5–5.0)
Alkaline Phosphatase: 124 U/L (ref 38–126)
Anion gap: 10 (ref 5–15)
BUN: 16 mg/dL (ref 6–20)
CO2: 25 mmol/L (ref 22–32)
Calcium: 8.7 mg/dL — ABNORMAL LOW (ref 8.9–10.3)
Chloride: 98 mmol/L (ref 98–111)
Creatinine, Ser: 1.29 mg/dL — ABNORMAL HIGH (ref 0.61–1.24)
GFR calc Af Amer: >60 mL/min (ref >60)
GFR calc non Af Amer: >60 mL/min (ref >60)
Glucose, Bld: 324 mg/dL — ABNORMAL HIGH (ref 70–99)
Potassium: 3.7 mmol/L (ref 3.5–5.1)
Sodium: 133 mmol/L — ABNORMAL LOW (ref 135–145)
Total Bilirubin: 0.1 mg/dL — ABNORMAL LOW (ref 0.3–1.2)
Total Protein: 6 g/dL — ABNORMAL LOW (ref 6.5–8.1)

## 2019-05-15 LAB — CBC WITH DIFFERENTIAL/PLATELET
Abs Immature Granulocytes: 0.08 10*3/uL — ABNORMAL HIGH (ref 0.00–0.07)
Basophils Absolute: 0.1 10*3/uL (ref 0.0–0.1)
Basophils Relative: 1 %
Eosinophils Absolute: 0.4 10*3/uL (ref 0.0–0.5)
Eosinophils Relative: 2 %
HCT: 28.8 % — ABNORMAL LOW (ref 39.0–52.0)
Hemoglobin: 9.2 g/dL — ABNORMAL LOW (ref 13.0–17.0)
Immature Granulocytes: 1 %
Lymphocytes Relative: 27 %
Lymphs Abs: 4.2 10*3/uL — ABNORMAL HIGH (ref 0.7–4.0)
MCH: 23.8 pg — ABNORMAL LOW (ref 26.0–34.0)
MCHC: 31.9 g/dL (ref 30.0–36.0)
MCV: 74.4 fL — ABNORMAL LOW (ref 80.0–100.0)
Monocytes Absolute: 1.3 10*3/uL — ABNORMAL HIGH (ref 0.1–1.0)
Monocytes Relative: 9 %
Neutro Abs: 9.4 10*3/uL — ABNORMAL HIGH (ref 1.7–7.7)
Neutrophils Relative %: 60 %
Platelets: 587 10*3/uL — ABNORMAL HIGH (ref 150–400)
RBC: 3.87 MIL/uL — ABNORMAL LOW (ref 4.22–5.81)
RDW: 13.9 % (ref 11.5–15.5)
WBC: 15.4 10*3/uL — ABNORMAL HIGH (ref 4.0–10.5)
nRBC: 0 % (ref 0.0–0.2)

## 2019-05-15 LAB — GLUCOSE, CAPILLARY
Glucose-Capillary: 289 mg/dL — ABNORMAL HIGH (ref 70–99)
Glucose-Capillary: 404 mg/dL — ABNORMAL HIGH (ref 70–99)
Glucose-Capillary: 73 mg/dL (ref 70–99)
Glucose-Capillary: 455 mg/dL — ABNORMAL HIGH (ref 70–99)

## 2019-05-15 LAB — GENTAMICIN LEVEL, TROUGH: Gentamicin Trough: 1.3 ug/mL (ref 0.5–2.0)

## 2019-05-15 LAB — PHOSPHORUS: Phosphorus: 4.7 mg/dL — ABNORMAL HIGH (ref 2.5–4.6)

## 2019-05-15 LAB — MAGNESIUM: Magnesium: 1.7 mg/dL (ref 1.7–2.4)

## 2019-05-15 MED ORDER — METHOCARBAMOL 1000 MG/10ML IJ SOLN
500.0000 mg | Freq: Three times a day (TID) | INTRAVENOUS | Status: DC | PRN
  Administered 2019-05-15 – 2019-05-16 (×2): 500 mg via INTRAVENOUS
  Filled 2019-05-15: qty 5

## 2019-05-15 MED ORDER — INSULIN GLARGINE 100 UNIT/ML ~~LOC~~ SOLN: 38.0000 [IU] | Freq: Every day | SUBCUTANEOUS | Status: DC

## 2019-05-15 MED ORDER — LIDOCAINE 5 % EX PTCH
1.0000 | MEDICATED_PATCH | Freq: Every day | CUTANEOUS | Status: DC
  Administered 2019-05-15 – 2019-05-16 (×2): 1 via TRANSDERMAL
  Filled 2019-05-15 (×2): qty 1

## 2019-05-15 MED ORDER — INSULIN ASPART 100 UNIT/ML ~~LOC~~ SOLN: 0.0000 [IU] | Freq: Every day | SUBCUTANEOUS | Status: DC

## 2019-05-15 MED ORDER — IOHEXOL 300 MG/ML  SOLN
100.0000 mL | Freq: Once | INTRAMUSCULAR | Status: AC | PRN
  Administered 2019-05-15: 100 mL via INTRAVENOUS

## 2019-05-15 MED ORDER — HYDROCODONE-ACETAMINOPHEN 5-325 MG PO TABS
1.0000 | ORAL_TABLET | Freq: Four times a day (QID) | ORAL | Status: DC | PRN
  Administered 2019-05-15 – 2019-05-16 (×4): 2 via ORAL
  Filled 2019-05-15: qty 1
  Filled 2019-05-15 (×4): qty 2

## 2019-05-15 MED ORDER — INSULIN ASPART 100 UNIT/ML ~~LOC~~ SOLN
0.0000 [IU] | Freq: Three times a day (TID) | SUBCUTANEOUS | Status: DC
  Administered 2019-05-16: 9 [IU] via SUBCUTANEOUS
  Administered 2019-05-16: 7 [IU] via SUBCUTANEOUS

## 2019-05-15 MED ORDER — INSULIN ASPART 100 UNIT/ML ~~LOC~~ SOLN
0.0000 [IU] | Freq: Three times a day (TID) | SUBCUTANEOUS | Status: DC
  Administered 2019-05-15: 15 [IU] via SUBCUTANEOUS

## 2019-05-15 MED ORDER — INSULIN GLARGINE 100 UNIT/ML ~~LOC~~ SOLN
48.0000 [IU] | Freq: Every day | SUBCUTANEOUS | Status: DC
  Administered 2019-05-16: 48 [IU] via SUBCUTANEOUS
  Filled 2019-05-15 (×3): qty 0.48

## 2019-05-15 MED ORDER — INSULIN ASPART 100 UNIT/ML ~~LOC~~ SOLN
7.0000 [IU] | Freq: Once | SUBCUTANEOUS | Status: AC
  Administered 2019-05-15: 7 [IU] via SUBCUTANEOUS

## 2019-05-15 NOTE — Progress Notes (Signed)
Luke [name] for Infectious Disease    Date of Admission:  05/08/2019   Total days of antibiotics 6           ID: Luke [name] is a 39 y.o. male with hx of IVDU, found to have streptococcal native mitral valve endocarditis Principal Problem:   Sepsis ([place]) Active Problems:   Hepatitis C   Drug abuse, IV ([place])   Essential hypertension   CKD (chronic kidney disease) stage 3, GFR 30-59 ml/min   Cellulitis   Diabetic polyneuropathy associated with type 1 diabetes mellitus ([name])   Hypothyroidism   Heroin abuse ([place])   Acute bacterial endocarditis    Subjective: Doing well overall. No other concerns today. Slept well last night and walking out in the hallways.   ROS: 12 point ros is negative. No fever, chills, nightsweats, PICC clean and functioning well.  No headaches, vision changes or abdominal pain   Medications:  . amLODipine  5 mg Oral Daily  . atorvastatin  20 mg Oral Daily  . buprenorphine-naloxone  1 tablet Sublingual BID  . Chlorhexidine Gluconate Cloth  6 each Topical Daily  . DULoxetine  30 mg Oral Daily  . enoxaparin (LOVENOX) injection  40 mg Subcutaneous Q24H  . gabapentin  400 mg Oral TID  . insulin aspart  0-15 Units Subcutaneous TID WC  . insulin aspart  0-5 Units Subcutaneous QHS  . insulin aspart  10 Units Subcutaneous TID WC  . [START ON 05/16/2019] insulin glargine  38 Units Subcutaneous Daily  . levothyroxine  50 mcg Oral Q0600  . lidocaine  1 patch Transdermal Daily  . pantoprazole  40 mg Oral Daily  . sertraline  50 mg Oral Daily  . sodium chloride flush  10-40 mL Intracatheter Q12H    Objective: Vital signs in last 24 hours: Temp:  [98.2 F (36.8 C)-99 F (37.2 C)] 98.2 F (36.8 C) (02/26 0519) Pulse Rate:  [93-103] 93 (02/26 0519) Resp:  [16-18] 16 (02/26 0519) BP: (142-150)/(85-97) 142/87 (02/26 0519) SpO2:  [100 %] 100 % (02/26 0519)   Physical Exam  Constitutional: He is oriented to person, place, and time. He appears  well-developed and well-nourished. No distress.  HENT: Mouth/Throat: Oropharynx is clear and moist. No oropharyngeal exudate.  Cardiovascular: Normal rate, regular rhythm and +systolic murmur Pulmonary/Chest: Effort normal and breath sounds normal. No respiratory distress. He has no wheezes.  Abdominal: Soft. Bowel sounds are normal. He exhibits no distension. There is no tenderness.  Lymphadenopathy: He has no cervical adenopathy.  Ext: significant keloid scarring to ACs bilaterally; PICC in place with clean/dry dressing Neurological: He is alert and oriented to person, place, and time.  Skin: Skin is warm and dry. No rash noted. No erythema.  Psychiatric: He has a normal mood and affect. His behavior is normal.     Lab Results Recent Labs    05/14/19 0435 05/15/19 0417  WBC 14.9* 15.4*  HGB 9.6* 9.2*  HCT 30.5* 28.8*  NA 136 133*  K 4.0 3.7  CL 100 98  CO2 27 25  BUN 16 16  CREATININE 1.31* 1.29*   Liver Panel Recent Labs    05/14/19 0435 05/15/19 0417  PROT 6.6 6.0*  ALBUMIN 2.6* 2.4*  AST 21 27  ALT 23 29  ALKPHOS 132* 124  BILITOT 0.4 0.1*    Microbiology: 2/21 blood cx NGTD at 24hr 2/19 blood cx +  Organism ID, Bacteria STREPTOCOCCUS MITIS/ORALIS   Resulting Agency [facility]  Susceptibility   Streptococcus mitis/oralis    MIC    CEFTRIAXONE  Sensitive1    CLINDAMYCIN <=0.25 SENS... Sensitive    PENICILLIN  Sensitive2    TETRACYCLINE 0.5 SENSITIVE  Sensitive    VANCOMYCIN 0.5 SENSITIVE  Sensitive       Results: No results found.   Assessment/Plan: Streptococcal Native Mitral Valve Endocarditis = Will continue with 2 weeks of treatment with synergistic Gentamicin + Penicillin. TCTS recommending medical management. Will plan surface echo at end of therapy.   Fatigue = Not doing much. Some may be r/t deconditioning in the setting of active infection. Concerned there is something more going on in R forearm; appears larger compared to left and more  swollen. D/W Dr. Alfredia [name] and will image with contrast R forearm to assess for abscess.   Dental Pain = likely course of #1. Panorex revealed lucencies involving tooth #12 in right maxilla; no obvious periapical lucencies. Will need dentistry follow up; unclear as to what his resources are outpatient.  Leukocytosis = improved today. ? Due to large vegetation described vs alternative seeded area yet to realize.  2View CXR is clean without any signs of infectious process.   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 Seems comfortable and willing to stay. On Suboxone for now. Hopeful he will be interested in long term treatment.   AKI = Cr within established range. May need some extra IVF hydration post- contrasted study of forearm.   Multiple skin lesions = imaging of right forearm as detailed above.    Vascular Access = PICC in place currently, functioning well.   Chronic Hepatitis C = Genotype 1a, RNA is 5,170,000 copies. Will ensure he has outpatient follow up arranged. He could use Hep B vaccines also. Hep B sAg pending.    [name], MSN, NP-C [facility] for Infectious Disease [name] Pager: [phone] Office: [place]: [phone]  05/15/2019, 1:38 PM

## 2019-05-15 NOTE — Progress Notes (Signed)
MD notified of HS CBG = 455. New order received to administer Novolog insulin 7units x 1.

## 2019-05-15 NOTE — Progress Notes (Signed)
CRITICAL VALUE ALERT  Critical Value:  cbg 404  Date & Time Notied:  05/15/2019 at 74  Provider Notified:  Dr.Sheikh  Orders Received/Actions taken: given insulin and adjust lantus

## 2019-05-15 NOTE — Progress Notes (Signed)
Pharmacy Antibiotic Note  Luke [name] is a 39 y.o. 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 Noted that penicillin MIC < 0.06.   Pharmacy has been consulted for gentamicin dosing. Scr had bumped up slightly yesterday to 1.31 but has now trended down to 1.29. CrCl ~ 77 ml/min. We attempted to collect a gentamicin trough last night but unfortunately it was sent down in the wrong tube and could not be processed by the lab. We will plan on attempting a gentamicin trough tonight.  Gentamicin level was 1.3 (goal trough <0.5), last dose given ~2230 last PM. Level drawn was 19hrs post dose and thus is not a trough and therefore not diagnostic. Level likely < 0.5 at 24 hours post dose.  Will resume at current dose while monitoring renal function.   Plan: Gentamicin 3 mg/kg (210 mg) every 24 hours  Goal trough < 0.5  Monitor renal function and trough at steady state Plan 14 days of gent/penicillin- Today is D#5/14  Height: 5\' 9"  (175.3 cm) Weight: 180 lb (81.6 kg) IBW/kg (Calculated) : 70.7  Temp (24hrs), Avg:98.6 F (37 C), Min:98.2 F (36.8 C), Max:99 F (37.2 C)  Recent Labs  Lab 05/08/19 2211 05/09/19 0040 05/09/19 0136 05/11/19 0321 05/12/19 0130 05/13/19 0430 05/14/19 0435 05/15/19 0417 05/15/19 1750  WBC  --   --    < > 16.8* 16.5* 16.5* 14.9* 15.4*  --   CREATININE  --   --    < > 1.23 1.19 1.25* 1.31* 1.29*  --   LATICACIDVEN 2.2* 1.6  --   --   --   --   --   --   --   GENTTROUGH  --   --   --   --   --   --   --   --  1.3   < > = values in this interval not displayed.    Estimated Creatinine Clearance: 76.9 mL/min (A) (by C-G formula based on SCr of 1.29 mg/dL (H)).    No Known Allergies    Mallorie Norrod A. Levada Dy, PharmD, BCPS, FNKF Clinical Pharmacist [place] Please utilize Amion for appropriate phone number to reach the unit pharmacist ([place])   05/15/2019 7:34 PM

## 2019-05-15 NOTE — Progress Notes (Signed)
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 Had recent positive strep viridans blood cultures on 12/31 during recent admission but patient left AMA. Lost to follow up until he initiated a telehealth visit for recurrent skin infections on 2/1, then on 2/14 was notified that he had positive blood cultures from 12/31 and was told to go to the ER. Began having chest pain and shortness of breath prompting his admission on 2/20. started on empiric vancomycin/cefepime.    2/21 TTE showed AV/MV vegetations, infectious disease and cardiology consulted. 2/23: TEE done and showed an EF of 65% from large, cystlike structure on the A2 segment and mobile, filamentous structure in the P1 region of the mitral valve.  Which was new compared to the findings on the TEE back in 11/2017.  2/24 Cardiothoracic surgery was consulted and patient had a PICC line placed yesterday.  ID has changed him to gentamicin and penicillin G and will continue antibiotic recommendations per ID.  2/25: The patient had no complaints and was happy that he did not need surgical intervention at this time therapy and reassess the bowel function 6 weeks and after completion of drug rehab with an echocardiogram.  Patient's blood sugars were uncontrolled so his regimen is being adjusted.  ID recommends continuing current synergistic treatment with gentamicin and penicillin.  Patient's Panorex did show a lucency in the right tooth and right maxilla.  2/26: Because of his persistent leukocytosis is of the asymmetry in his arms with his left arm being smaller than his right the infectious disease team is going to CT scan his arm and have wound care further assess.  Patient  is also globally weak so we will obtain a PT and OT evaluation.  His blood sugars remain somewhat uncontrolled so Lantus has been increased to 48 units  Assessment & Plan:   Principal Problem:   Sepsis ([place]) Active Problems:   Drug abuse, IV ([name])   Essential hypertension   CKD (chronic kidney disease) stage 3, GFR 30-59 ml/min   Cellulitis   Diabetic polyneuropathy associated with type 1 diabetes mellitus ([name])   Hepatitis C   Hypothyroidism   Heroin abuse ([place])   Acute bacterial endocarditis   Sepsis Secondary to 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, in setting of IVDU sepsis resolved -Hx of strep viridans bacteremia diagnosed 03/19/19, left AMA and was not treated -WBC at started increasing but now slightly decreased and is down from 20,000 and now is 14,900 -ID gave oritavancin x1 and changed to ceftriaxone and now is on Gentamicin 210 mg q24h and Pen G and will need 2 weeks of it; will need close monitoring of his renal function his creatinine is elevated slightly today at 131 -Asked TCTS to evaluate given his Endocarditis and Moderate Mitral Regurgitation they recommend medical management and conservative therapy at this point and repeating echocardiogram at the end of therapy as well as completion of a drug rehab -TEE showed a large cystlike structure on the A2 segment mobile filamentous on the A2 segment and mobile on the P1 region of the mitral valve along with moderate mitral regurg and no mitral stenosis.  No evidence of endocarditis of the aortic valve and no LA/LAA thrombus or  mass.  There is no ASD or PFO noted. -Appreciate further ID recommendations and there ordering an orthopantogram given that the patient told him that he was having dental pain but he denied anything to me -His orthopantogram showed "Probable dental carie involving tooth #12 in the right maxilla.  No obvious periapical lucency." -May need to to reach out to in-house  dentistry Dr. Darrick Grinder for further evaluation while patient is hospitalized here -They feel that the patient's persistent leukocytosis may be related to the large vegetation described with some altered negative seated area that we have yet to realize -ID has ordered a AP chest x-ray for evaluation of possible septic emboli and there is no tricuspid valve involvement in the TEE so they are deferring CT for now; his repeat chest x-ray showed no acute cardiopulmonary process -Because patient still has a persistent leukocytosis and he has asymmetry in his arms with his right arm being slightly bigger than his left infectious disease has ordered some imaging of his right arm with a CT scan and I have also recommended wound care, reevaluate. -His forearm right side is still currently being done and report is not available  Bilateral upper extremity wounds present on admission, likely secondary to IVDU -Leading to difficulty of IV access -Wound care -PICC line is now in place -as above CT scan of the right forearm is being done  DM1 with episode of Hypoglycemia/Hyperglycemia  -He has had several episodes of hypoglycemia and hyperglycemia and his blood sugars have been extremely variable -HA1c in December 10.0 -He had missed his Lantus for 2 days and unclear why but this is been restarted  -His Lantus is increased for the last few days and increased from 30 units now to 36 now to further 48 and have increased his mealtime insulin to 10 units 3 times daily with meals and also change to sliding scale from sensitive to moderate NovoLog sliding scale before meals and at bedtime -Continue to monitor CBGs carefully and adjust as necessary -CBGs have been fluctuating went from 61-512 yesterday and now blood sugars are still elevated and ranging from 73-404  AKI on CKD 3 -Currently 2.03 on admission, currently at baseline and improved -She has BUN/creatinine has been relatively stable and slightly  improved from yesterday 16/1.29 -Continue Holding lisinopril -Continue to Monitor and Trend Renal Fxn now that he is getting gentamicin for antibiotics -Repeat CMP in AM   Hypothyroidism -Stable -C/w Levothyroxine 50 mcg po Daily   HTN -Continue Amlodipine 5 mg po Daily  -Holding Lisinopril for now  Right lower extremity calf tenderness/swelling -RLE [place]: No DVT, bilateral groin lymphadenopathy noted -? Cause of his Leukocytosis -May consider further imaging if nothing else is revealing -We will call PT OT to further evaluate and treat  IVDU -Wishes to quit -Started on Suboxone this admission by prior Physician and is now on Buprenorphine-Naloxone 8-2 mg per SL 1 tab SL BID -Follow up with SW  GERD -C/w PPI Pantoprazole 40 mg po Daily   Anxiety  -C/w Sertraline 50 mg po Daily, Duloxetine 400 mg po TID,   HLD -C/w Atorvastatin 20 mg po Daily   Back Pain -Added oxycodone and will need judicious narcotic pain control And also added Robaxin and a K pad as well as a lidocaine patch next-continue to monitor and if continues to worsen will need MRI imaging  Microcytic Anemia -Stable -Patient's hemoglobin/hematocrit stable is now 9.2/20.8 -Anemia panel was done and showed an iron level of 12, U  IBC 236, TIBC of 248, saturation ratios of 5%, ferritin level 150 -Continue to monitor for signs and symptoms of bleeding; currently no overt bleeding noted -Repeat CBC in a.m.  Thrombocytosis -Likely reactive in the setting of his infection and antibiotic usage -Patient's platelet count is now gone from 552,000 to 587,000 -Continue to monitor and trend repeat CBC in a.m.  Hepatitis C -The genotype of 1A and RNA level of 10,500,000 back in 2019 -Repeat RNA was 5,170,000 with a HCV quantitative log of 6.713 and his hepatitis B surface antigen was negative nonreactive -Follow-up with ID in the clinic  Hyperphosphatemia -Patient's phosphorus level was 4.7 today  -continue to monitor  and trend and repeat phosphorus level in a.m.  DVT prophylaxis: Enoxaparin 40 sq q24h Code Status: FULL CODE  Family Communication: No family present at bedside  Disposition Plan: Remain Hospitalized for Inpatient Treatment of his endocarditis Streptococcus bacteremia given his IV drug abuse and will have PT OT to evaluate and treat  Consultants:   Infectious Diseases  Cardiology  TCTS  Procedures:  TEE Brief TEE Note  LVEF 60-65% Large, cyst-like structure on the A2 segment and mobile, filamentous structure on the P1 region of the mitral valve. Findings are new compared to TEE 11/2017. Moderate mitral regurgitation.  No mitral stenosis. No evidence of endocarditis on the aortic valve. No LA/LAA thrombus or mass No ASD or PFO.   Antimicrobials:  Anti-infectives (From admission, onward)   Start     Dose/Rate Route Frequency Ordered Stop   05/12/19 0500  penicillin G potassium 12 Million Units in dextrose 5 % 500 mL continuous infusion     12 Million Units 41.7 mL/hr over 12 Hours Intravenous Every 12 hours 05/11/19 1604     05/11/19 1700  gentamicin (GARAMYCIN) 210 mg in dextrose 5 % 50 mL IVPB     3 mg/kg  70.7 kg (Ideal) 110.5 mL/hr over 30 Minutes Intravenous Every 24 hours 05/11/19 1618     05/10/19 1400  cefTRIAXone (ROCEPHIN) 2 g in sodium chloride 0.9 % 100 mL IVPB  Status:  Discontinued     2 g 200 mL/hr over 30 Minutes Intravenous Every 24 hours 05/10/19 1259 05/11/19 1604   05/10/19 1330  Oritavancin Diphosphate (ORBACTIV) 1,200 mg in dextrose 5 % IVPB     1,200 mg 333.3 mL/hr over 180 Minutes Intravenous Once 05/10/19 1317 05/10/19 1935   05/10/19 0845  ceFEPIme (MAXIPIME) 2 g in sodium chloride 0.9 % 100 mL IVPB  Status:  Discontinued     2 g 200 mL/hr over 30 Minutes Intravenous Every 8 hours 05/10/19 0836 05/10/19 1259   05/10/19 0000  vancomycin (VANCOREADY) IVPB 1250 mg/250 mL  Status:  Discontinued     1,250 mg 166.7 mL/hr over 90 Minutes Intravenous  Every 24 hours 05/09/19 0031 05/10/19 1317   05/09/19 2200  ceFEPIme (MAXIPIME) 2 g in sodium chloride 0.9 % 100 mL IVPB  Status:  Discontinued     2 g 200 mL/hr over 30 Minutes Intravenous Every 12 hours 05/09/19 1041 05/10/19 0836   05/09/19 0800  ceFEPIme (MAXIPIME) 2 g in sodium chloride 0.9 % 100 mL IVPB  Status:  Discontinued     2 g 200 mL/hr over 30 Minutes Intravenous Every 8 hours 05/09/19 0012 05/09/19 1041   05/09/19 0015  ceFEPIme (MAXIPIME) 2 g in sodium chloride 0.9 % 100 mL IVPB     2 g 200 mL/hr over 30 Minutes Intravenous  Once 05/09/19 0004 05/09/19 0130  05/09/19 0015  metroNIDAZOLE (FLAGYL) IVPB 500 mg     500 mg 100 mL/hr over 60 Minutes Intravenous  Once 05/09/19 0004 05/09/19 0150   05/09/19 0015  vancomycin (VANCOCIN) IVPB 1000 mg/200 mL premix  Status:  Discontinued     1,000 mg 200 mL/hr over 60 Minutes Intravenous  Once 05/09/19 0004 05/09/19 0010   05/09/19 0015  vancomycin (VANCOREADY) IVPB 1500 mg/300 mL     1,500 mg 150 mL/hr over 120 Minutes Intravenous  Once 05/09/19 0010 05/09/19 0351     Subjective: Seen and examined at bedside and he states that he slept well but is now complaining of back pain again.  States is worse from yesterday slightly and states is an 8 out of 10 today.  Feels weak and has not really ambulated.  No chest pain, lightheadedness or dizziness.  No other concerns or complaints at this time.  Objective: Vitals:   05/14/19 1603 05/14/19 2046 05/15/19 0519 05/15/19 1421  BP: (!) 150/97 (!) 144/85 (!) 142/87 (!) 141/79  Pulse: 95 (!) 103 93 91  Resp: 18 16 16 17   Temp: 98.6 F (37 C) 99 F (37.2 C) 98.2 F (36.8 C) 98.6 F (37 C)  TempSrc: Oral Oral Oral Oral  SpO2: 100% 100% 100% 100%  Weight:      Height:        Intake/Output Summary (Last 24 hours) at 05/15/2019 1829 Last data filed at 05/15/2019 [id number] Gross per 24 hour  Intake 10 ml  Output 780 ml  Net -770 ml   Filed Weights   05/09/19 0400 05/12/19 1118  Weight:  80 kg 81.6 kg   Examination: Physical Exam:  Constitutional: WN/WD overweight African-American male currently in no acute distress appears calm but does appear uncomfortable laying in the bed watching television complaining of back pain,  Eyes: Lids and conjunctivae normal, sclerae anicteric  ENMT: External Ears, Nose appear normal. Grossly normal hearing. Mucous membranes are moist.  Neck: Appears normal, supple, no cervical masses, normal ROM, no appreciable thyromegaly; no JVD Respiratory: Mildly diminished to auscultation bilaterally, no wheezing, rales, rhonchi or crackles. Normal respiratory effort and patient is not tachypenic. No accessory muscle use.  Unlabored breathing Cardiovascular: RRR, has a 2 out of 6 systolic murmur Abdomen: Soft, non-tender, slightly distended secondary body habitus. No masses palpated. No appreciable hepatosplenomegaly. Bowel sounds positive.  GU: Deferred. Musculoskeletal: No clubbing / cyanosis of digits/nails.  The left great toe amputation Skin: Has bilateral scarring and keloids due to his prior drug use and his right arm is bigger than the left Neurologic: CN 2-12 grossly intact with no focal deficits.  Romberg sign and cerebellar reflexes were not assessed Psychiatric: Normal judgment and insight. Alert and oriented x 3. Normal mood and appropriate affect.   Data Reviewed: I have personally reviewed following labs and imaging studies  CBC: Recent Labs  Lab 05/08/19 2210 05/09/19 0634 05/11/19 0321 05/12/19 0130 05/13/19 0430 05/14/19 0435 05/15/19 0417  WBC 20.1*   < > 16.8* 16.5* 16.5* 14.9* 15.4*  NEUTROABS 13.7*  --   --   --   --  8.5* 9.4*  HGB 10.1*   < > 10.4* 10.0* 9.9* 9.6* 9.2*  HCT 32.0*   < > 32.1* 31.3* 32.0* 30.5* 28.8*  MCV 75.3*   < > 72.3* 73.1* 75.5* 74.2* 74.4*  PLT 592*   < > 532* 534* 592* 552* 587*   < > = values in this interval not displayed.   Basic Metabolic  Panel: Recent Labs  Lab 05/11/19 0321  05/12/19 0130 05/13/19 0430 05/14/19 0435 05/15/19 0417  NA 131* 135 138 136 133*  K 3.9 4.1 3.8 4.0 3.7  CL 98 102 103 100 98  CO2 23 22 26 27 25   GLUCOSE 256* 141* 85 256* 324*  BUN 12 17 16 16 16   CREATININE 1.23 1.19 1.25* 1.31* 1.29*  CALCIUM 8.7* 8.7* 8.8* 8.9 8.7*  MG  --   --   --  1.8 1.7  PHOS  --   --   --  4.2 4.7*   GFR: Estimated Creatinine Clearance: 76.9 mL/min (A) (by C-G formula based on SCr of 1.29 mg/dL (H)). Liver Function Tests: Recent Labs  Lab 05/08/19 2210 05/14/19 0435 05/15/19 0417  AST 21 21 27   ALT 17 23 29   ALKPHOS 136* 132* 124  BILITOT 0.4 0.4 0.1*  PROT 7.7 6.6 6.0*  ALBUMIN 2.9* 2.6* 2.4*   No results for input(s): LIPASE, AMYLASE in the last 168 hours. No results for input(s): AMMONIA in the last 168 hours. Coagulation Profile: Recent Labs  Lab 05/08/19 2210  INR 1.0   Cardiac Enzymes: No results for input(s): CKTOTAL, CKMB, CKMBINDEX, TROPONINI in the last 168 hours. BNP (last 3 results) No results for input(s): PROBNP in the last 8760 hours. HbA1C: No results for input(s): HGBA1C in the last 72 hours. CBG: Recent Labs  Lab 05/14/19 1650 05/14/19 2126 05/15/19 0739 05/15/19 1157 05/15/19 1630  GLUCAP 329* 410* 289* 404* 73   Lipid Profile: No results for input(s): CHOL, HDL, LDLCALC, TRIG, CHOLHDL, LDLDIRECT in the last 72 hours. Thyroid Function Tests: No results for input(s): TSH, T4TOTAL, FREET4, T3FREE, THYROIDAB in the last 72 hours. Anemia Panel: No results for input(s): VITAMINB12, FOLATE, FERRITIN, TIBC, IRON, RETICCTPCT in the last 72 hours. Sepsis Labs: Recent Labs  Lab 05/08/19 2211 05/09/19 0040  LATICACIDVEN 2.2* 1.6    Recent Results (from the past 240 hour(s))  Culture, blood (Routine x 2)     Status: Abnormal   Collection Time: 05/08/19 10:00 PM   Specimen: BLOOD RIGHT HAND  Result Value Ref Range Status   Specimen Description BLOOD RIGHT HAND  Final   Special Requests   Final    BOTTLES  DRAWN AEROBIC ONLY Blood Culture adequate volume   Culture  Setup Time   Final    AEROBIC BOTTLE ONLY GRAM POSITIVE COCCI CRITICAL RESULT CALLED TO, READ BACK BY AND VERIFIED WITH: PHRMD FEAY LORA AT [id number] BY MESSAN [place] ON 05/10/2019 Performed at [facility], [place] [address], [place], [place] [address]    Culture STREPTOCOCCUS MITIS/ORALIS (A)  Final   Report Status 05/11/2019 FINAL  Final   Organism ID, Bacteria STREPTOCOCCUS MITIS/ORALIS  Final      Susceptibility   Streptococcus mitis/oralis - MIC*    TETRACYCLINE 0.5 SENSITIVE Sensitive     VANCOMYCIN 0.5 SENSITIVE Sensitive     CLINDAMYCIN <=0.25 SENSITIVE Sensitive     PENICILLIN Value in next row Sensitive      SENSITIVE<=0.06    CEFTRIAXONE Value in next row Sensitive      SENSITIVE<=0.12    * STREPTOCOCCUS MITIS/ORALIS  Blood Culture ID Panel (Reflexed)     Status: Abnormal   Collection Time: 05/08/19 10:00 PM  Result Value Ref Range Status   Enterococcus species NOT DETECTED NOT DETECTED Final   Listeria monocytogenes NOT DETECTED NOT DETECTED Final   Staphylococcus species NOT DETECTED NOT DETECTED Final   Staphylococcus aureus (BCID)  NOT DETECTED NOT DETECTED Final   Streptococcus species DETECTED (A) NOT DETECTED Final    Comment: Not Enterococcus species, Streptococcus agalactiae, Streptococcus pyogenes, or Streptococcus pneumoniae. CRITICAL RESULT CALLED TO, READ BACK BY AND VERIFIED WITH: PHRMD FEAY LORA AT [id number] BY [place] ON 05/10/2019    Streptococcus agalactiae NOT DETECTED NOT DETECTED Final   Streptococcus pneumoniae NOT DETECTED NOT DETECTED Final   Streptococcus pyogenes NOT DETECTED NOT DETECTED Final   Acinetobacter baumannii NOT DETECTED NOT DETECTED Final   Enterobacteriaceae species NOT DETECTED NOT DETECTED Final   Enterobacter cloacae complex NOT DETECTED NOT DETECTED Final   Escherichia coli NOT DETECTED NOT DETECTED Final   Klebsiella oxytoca NOT DETECTED NOT DETECTED Final    Klebsiella pneumoniae NOT DETECTED NOT DETECTED Final   Proteus species NOT DETECTED NOT DETECTED Final   Serratia marcescens NOT DETECTED NOT DETECTED Final   Haemophilus influenzae NOT DETECTED NOT DETECTED Final   Neisseria meningitidis NOT DETECTED NOT DETECTED Final   Pseudomonas aeruginosa NOT DETECTED NOT DETECTED Final   Candida albicans NOT DETECTED NOT DETECTED Final   Candida glabrata NOT DETECTED NOT DETECTED Final   Candida krusei NOT DETECTED NOT DETECTED Final   Candida parapsilosis NOT DETECTED NOT DETECTED Final   Candida tropicalis NOT DETECTED NOT DETECTED Final    Comment: Performed at [facility], [place] [address], [place], [place] [address]  Culture, blood (Routine x 2)     Status: Abnormal   Collection Time: 05/08/19 10:12 PM   Specimen: BLOOD RIGHT ARM  Result Value Ref Range Status   Specimen Description BLOOD RIGHT ARM  Final   Special Requests   Final    BOTTLES DRAWN AEROBIC AND ANAEROBIC Blood Culture adequate volume   Culture  Setup Time   Final    GRAM POSITIVE COCCI IN BOTH AEROBIC AND ANAEROBIC BOTTLES CRITICAL RESULT CALLED TO, READ BACK BY AND VERIFIED WITH: PHARMD JESSICA BARNEY @1838  05/09/19 AKT    Culture (A)  Final    STREPTOCOCCUS MITIS/ORALIS SUSCEPTIBILITIES PERFORMED ON PREVIOUS CULTURE WITHIN THE LAST 5 DAYS. Performed at [facility], [place] [address], [place], [place] [address]    Report Status 05/11/2019 FINAL  Final  Urine culture     Status: None   Collection Time: 05/09/19 12:03 AM   Specimen: In/Out Cath Urine  Result Value Ref Range Status   Specimen Description IN/OUT CATH URINE  Final   Special Requests NONE  Final   Culture   Final    NO GROWTH Performed at [facility], [place] [address] [place], [place] [address]    Report Status 05/10/2019 FINAL  Final  SARS CORONAVIRUS 2 (TAT 6-24 HRS) Nasopharyngeal Nasopharyngeal Swab     Status: None   Collection Time: 05/09/19  1:24 AM   Specimen: Nasopharyngeal  Swab  Result Value Ref Range Status   SARS Coronavirus 2 NEGATIVE NEGATIVE Final    Comment: (NOTE) SARS-CoV-2 target nucleic acids are NOT DETECTED. The SARS-CoV-2 RNA is generally detectable in upper and lower respiratory specimens during the acute phase of infection. Negative results do not preclude SARS-CoV-2 infection, do not rule out co-infections with other pathogens, and should not be used as the sole basis for treatment or other patient management decisions. Negative results must be combined with clinical observations, patient history, and epidemiological information. The expected result is Negative. Fact Sheet for Patients: SugarRoll.be Fact Sheet for Healthcare Providers: https://www.woods-mathews.com/ This test is not yet approved or cleared by the  [place] Architectural technologist and  has been authorized for detection and/or diagnosis of SARS-CoV-2 by FDA under an Print production planner (EUA). This EUA will remain  in effect (meaning this test can be used) for the duration of the COVID-19 declaration under Section 56 4(b)(1) of the Act, 21 U.S.C. section 360bbb-3(b)(1), unless the authorization is terminated or revoked sooner. Performed at [facility], [place] [address] [place], [place] [address]   Culture, blood (Routine X 2) w Reflex to ID Panel     Status: None (Preliminary result)   Collection Time: 05/10/19  3:22 PM   Specimen: BLOOD  Result Value Ref Range Status   Specimen Description BLOOD LEFT ARM  Final   Special Requests   Final    BOTTLES DRAWN AEROBIC ONLY Blood Culture adequate volume Performed at [facility], [place] [address], [place], [place] [address]    Culture NO GROWTH 4 DAYS  Final   Report Status PENDING  Incomplete  Culture, blood (Routine X 2) w Reflex to ID Panel     Status: None (Preliminary result)   Collection Time: 05/10/19  3:29 PM   Specimen: BLOOD  Result Value Ref Range Status   Specimen  Description BLOOD LEFT ARM  Final   Special Requests   Final    BOTTLES DRAWN AEROBIC ONLY Blood Culture adequate volume Performed at [facility], [place] [address], [place], [place] [address]    Culture NO GROWTH 4 DAYS  Final   Report Status PENDING  Incomplete  MRSA PCR Screening     Status: None   Collection Time: 05/11/19  5:25 PM   Specimen: Nasal Mucosa; Nasopharyngeal  Result Value Ref Range Status   MRSA by PCR NEGATIVE NEGATIVE Final    Comment:        The GeneXpert MRSA Assay (FDA approved for NASAL specimens only), is one component of a comprehensive MRSA colonization surveillance program. It is not intended to diagnose MRSA infection nor to guide or monitor treatment for MRSA infections. Performed at [facility], [place] [address], [place], [place] [address]     RN Pressure Injury Documentation:     Estimated body mass index is 26.58 kg/m as calculated from the following:   Height as of this encounter: 5\' 9"  (1.753 m).   Weight as of this encounter: 81.6 kg.  Malnutrition Type:      Malnutrition Characteristics:      Nutrition Interventions:      Radiology Studies: No results found. Scheduled Meds: . amLODipine  5 mg Oral Daily  . atorvastatin  20 mg Oral Daily  . buprenorphine-naloxone  1 tablet Sublingual BID  . Chlorhexidine Gluconate Cloth  6 each Topical Daily  . DULoxetine  30 mg Oral Daily  . enoxaparin (LOVENOX) injection  40 mg Subcutaneous Q24H  . gabapentin  400 mg Oral TID  . insulin aspart  0-15 Units Subcutaneous TID WC  . insulin aspart  0-5 Units Subcutaneous QHS  . insulin aspart  10 Units Subcutaneous TID WC  . [START ON 05/16/2019] insulin glargine  38 Units Subcutaneous Daily  . levothyroxine  50 mcg Oral Q0600  . lidocaine  1 patch Transdermal Daily  . pantoprazole  40 mg Oral Daily  . sertraline  50 mg Oral Daily  . sodium chloride flush  10-40 mL Intracatheter Q12H   Continuous Infusions: . gentamicin 210  mg (05/15/19 1800)  . methocarbamol (ROBAXIN) IV 500 mg (05/15/19 1652)  .  penicillin g continuous IV infusion 12 Million Units (05/15/19 1801)     LOS: 6 days   Kerney Elbe, DO [facility] PAGER is on [place]  If 7PM-7AM, please contact night-coverage [email or web address]

## 2019-05-15 NOTE — Progress Notes (Signed)
Pt had CT scan of right arm.

## 2019-05-15 NOTE — Progress Notes (Signed)
Inpatient Diabetes Program Recommendations  AACE/ADA: New Consensus Statement on Inpatient Glycemic Control (2015)  Target Ranges:  Prepandial:   less than 140 mg/dL      Peak postprandial:   less than 180 mg/dL (1-2 hours)      Critically ill patients:  140 - 180 mg/dL   Lab Results  Component Value Date   GLUCAP 404 (H) 05/15/2019   HGBA1C 10.0 (H) 03/19/2019    Review of Glycemic Control Results for Luke [name], Luke [name] (MRN [id number]) as of 05/15/2019 12:06  Ref. Range 05/14/2019 21:26 05/15/2019 07:39 05/15/2019 11:57  Glucose-Capillary Latest Ref Range: 70 - 99 mg/dL 410 (H) 289 (H) 404 (H)   DM 1 Outpatient Diabetes medications: Lantus 40 units bid, Humalog 4-20 units tid with meals Current orders for Inpatient glycemic control: Lantus36units qd, Novolog sensitive tid with meals & QHS, Novolog10 units tid with meals  Inpatient Diabetes Program Recommendations:  Recommend increasing Lantus to 48 units qd.  Thanks, Bronson Curb, MSN, RNC-OB Diabetes Coordinator [phone] (8a-5p)

## 2019-05-15 NOTE — Progress Notes (Signed)
Pharmacy Antibiotic Note  Luke [name] is a 39 y.o. 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 Noted that penicillin MIC < 0.06.   Pharmacy has been consulted for gentamicin dosing. Scr had bumped up slightly yesterday to 1.31 but has now trended down to 1.29. CrCl ~ 77 ml/min. We attempted to collect a gentamicin trough last night but unfortunately it was sent down in the wrong tube and could not be processed by the lab. We will plan on attempting a gentamicin trough tonight.   Plan: Gentamicin 3 mg/kg (210 mg) every 24 hours  Goal trough < 0.5  Monitor renal function and trough at steady state Plan 14 days of gent/penicillin- Today is D#5/14  Height: 5\' 9"  (175.3 cm) Weight: 180 lb (81.6 kg) IBW/kg (Calculated) : 70.7  Temp (24hrs), Avg:98.6 F (37 C), Min:98.2 F (36.8 C), Max:99 F (37.2 C)  Recent Labs  Lab 05/08/19 2211 05/09/19 0040 05/09/19 0136 05/11/19 0321 05/12/19 0130 05/13/19 0430 05/14/19 0435 05/15/19 0417  WBC  --   --    < > 16.8* 16.5* 16.5* 14.9* 15.4*  CREATININE  --   --    < > 1.23 1.19 1.25* 1.31* 1.29*  LATICACIDVEN 2.2* 1.6  --   --   --   --   --   --    < > = values in this interval not displayed.    Estimated Creatinine Clearance: 76.9 mL/min (A) (by C-G formula based on SCr of 1.29 mg/dL (H)).    No Known Allergies    Thank you for allowing pharmacy to be a part of this patient's care.  Jimmy Footman, PharmD, BCPS, BCIDP Infectious Diseases Clinical Pharmacist Phone: [phone] 05/15/2019 8:22 AM

## 2019-05-15 NOTE — Progress Notes (Signed)
Pt cbg 404 noon time and 73 this afternoon, alert and oriented, asymptomatic.

## 2019-05-16 ENCOUNTER — Inpatient Hospital Stay: Payer: Self-pay

## 2019-05-16 DIAGNOSIS — L0291 Cutaneous abscess, unspecified: Secondary | ICD-10-CM

## 2019-05-16 DIAGNOSIS — A409 Streptococcal sepsis, unspecified: Secondary | ICD-10-CM

## 2019-05-16 LAB — COMPREHENSIVE METABOLIC PANEL
ALT: 28 U/L (ref 0–44)
AST: 23 U/L (ref 15–41)
Albumin: 2.4 g/dL — ABNORMAL LOW (ref 3.5–5.0)
Alkaline Phosphatase: 117 U/L (ref 38–126)
Anion gap: 8 (ref 5–15)
BUN: 18 mg/dL (ref 6–20)
CO2: 25 mmol/L (ref 22–32)
Calcium: 8.7 mg/dL — ABNORMAL LOW (ref 8.9–10.3)
Chloride: 98 mmol/L (ref 98–111)
Creatinine, Ser: 1.62 mg/dL — ABNORMAL HIGH (ref 0.61–1.24)
GFR calc Af Amer: >60 mL/min (ref >60)
GFR calc non Af Amer: 53 mL/min — ABNORMAL LOW (ref >60)
Glucose, Bld: 469 mg/dL — ABNORMAL HIGH (ref 70–99)
Potassium: 4.4 mmol/L (ref 3.5–5.1)
Sodium: 131 mmol/L — ABNORMAL LOW (ref 135–145)
Total Bilirubin: 0.2 mg/dL — ABNORMAL LOW (ref 0.3–1.2)
Total Protein: 6 g/dL — ABNORMAL LOW (ref 6.5–8.1)

## 2019-05-16 LAB — C-REACTIVE PROTEIN: CRP: 0.6 mg/dL (ref <1.0)

## 2019-05-16 LAB — CBC WITH DIFFERENTIAL/PLATELET
Abs Immature Granulocytes: 0.08 10*3/uL — ABNORMAL HIGH (ref 0.00–0.07)
Basophils Absolute: 0.1 10*3/uL (ref 0.0–0.1)
Basophils Relative: 1 %
Eosinophils Absolute: 0.3 10*3/uL (ref 0.0–0.5)
Eosinophils Relative: 2 %
HCT: 28.5 % — ABNORMAL LOW (ref 39.0–52.0)
Hemoglobin: 9 g/dL — ABNORMAL LOW (ref 13.0–17.0)
Immature Granulocytes: 1 %
Lymphocytes Relative: 25 %
Lymphs Abs: 3.6 10*3/uL (ref 0.7–4.0)
MCH: 23.6 pg — ABNORMAL LOW (ref 26.0–34.0)
MCHC: 31.6 g/dL (ref 30.0–36.0)
MCV: 74.8 fL — ABNORMAL LOW (ref 80.0–100.0)
Monocytes Absolute: 1.2 10*3/uL — ABNORMAL HIGH (ref 0.1–1.0)
Monocytes Relative: 8 %
Neutro Abs: 9.1 10*3/uL — ABNORMAL HIGH (ref 1.7–7.7)
Neutrophils Relative %: 63 %
Platelets: 549 10*3/uL — ABNORMAL HIGH (ref 150–400)
RBC: 3.81 MIL/uL — ABNORMAL LOW (ref 4.22–5.81)
RDW: 14.2 % (ref 11.5–15.5)
WBC: 14.4 10*3/uL — ABNORMAL HIGH (ref 4.0–10.5)
nRBC: 0 % (ref 0.0–0.2)

## 2019-05-16 LAB — PHOSPHORUS: Phosphorus: 4.5 mg/dL (ref 2.5–4.6)

## 2019-05-16 LAB — GLUCOSE, CAPILLARY
Glucose-Capillary: 394 mg/dL — ABNORMAL HIGH (ref 70–99)
Glucose-Capillary: 328 mg/dL — ABNORMAL HIGH (ref 70–99)

## 2019-05-16 LAB — MAGNESIUM: Magnesium: 1.8 mg/dL (ref 1.7–2.4)

## 2019-05-16 LAB — SEDIMENTATION RATE: Sed Rate: 55 mm/hr — ABNORMAL HIGH (ref 0–16)

## 2019-05-16 MED ORDER — POLYETHYLENE GLYCOL 3350 17 G PO PACK
17.0000 g | PACK | Freq: Two times a day (BID) | ORAL | Status: DC
  Administered 2019-05-16: 10:00:00 17 g via ORAL
  Filled 2019-05-16: qty 1

## 2019-05-16 MED ORDER — SENNOSIDES-DOCUSATE SODIUM 8.6-50 MG PO TABS
1.0000 | ORAL_TABLET | Freq: Two times a day (BID) | ORAL | Status: DC
  Administered 2019-05-16: 10:00:00 1 via ORAL
  Filled 2019-05-16: qty 1

## 2019-05-16 MED ORDER — BISACODYL 10 MG RE SUPP: 10.0000 mg | Freq: Every day | RECTAL | Status: DC | PRN

## 2019-05-16 NOTE — Progress Notes (Signed)
PICC line removed by IV team, patient was told to remain on bed for at least 30 minutes then he may go. Patient refused to wait for 30 minutes and walked out with his belongings.

## 2019-05-16 NOTE — Progress Notes (Signed)
  Order seen for [employer].  There is no staff in IR on the weekend except for emergent procedures.  Recommend placement of central line by anesthesia tomorrow in [place] PA-C 05/16/2019 1:53 PM

## 2019-05-16 NOTE — Evaluation (Signed)
Physical Therapy Evaluation and D/C [name] MRN: [id number] DOB: Jan 06, 1981 Today's Date: 05/16/2019   History of Present Illness  Luke [name] is a 39 y.o. 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  2/21 TTE showed AV/MV vegetations, infectious disease and cardiology consulted.  Clinical Impression  Pt admitted with above diagnosis. Pt was able to ambulate on unit without physical assist and performs ADLs with independence as well. Has no PT needs as all mobility is Independent. Pt does not need skilled PT at this time. Will sign off.   Follow Up Recommendations No PT follow up    Equipment Recommendations  None recommended by PT    Recommendations for Other Services       Precautions / Restrictions Precautions Precautions: None Restrictions Weight Bearing Restrictions: No      Mobility  Bed Mobility Overal bed mobility: Independent                Transfers Overall transfer level: Independent                  Ambulation/Gait Ambulation/Gait assistance: Independent Gait Distance (Feet): 550 Feet Assistive device: None Gait Pattern/deviations: WFL(Within Functional Limits)   Gait velocity interpretation: >2.62 ft/sec, indicative of community ambulatory General Gait Details: No difficulties with gait with challenges.   Stairs Stairs: Yes Stairs assistance: Supervision Stair Management: One rail Right Number of Stairs: 2 General stair comments: limited by IV in doing a flight  Wheelchair Mobility    Modified Rankin (Stroke Patients Only)       Balance                                 Standardized Balance Assessment Standardized Balance Assessment : Dynamic Gait Index   Dynamic Gait Index Level Surface: Normal Change in Gait Speed: Normal Gait with Horizontal Head Turns:  Normal Gait with Vertical Head Turns: Normal Gait and Pivot Turn: Normal Step Over Obstacle: Normal Step Around Obstacles: Normal Steps: Mild Impairment Total Score: 23       Pertinent Vitals/Pain Pain Assessment: Faces Faces Pain Scale: Hurts even more Pain Location: back  Pain Descriptors / Indicators: Discomfort Pain Intervention(s): Limited activity within patient's tolerance;Monitored during session;Repositioned    Home Living Family/patient expects to be discharged to:: Private residence Living Arrangements: Spouse/significant other;Children Available Help at Discharge: Family;Available 24 hours/day Type of Home: House Home Access: Level entry     Home Layout: Two level;Bed/bath upstairs;1/2 bath on main level Home Equipment: Walker - 2 wheels Additional Comments: has studio apartment off house he could stfay in     Prior Function Level of Independence: Independent               Hand Dominance   Dominant Hand: Right    Extremity/Trunk Assessment   Upper Extremity Assessment Upper Extremity Assessment: Defer to OT evaluation    Lower Extremity Assessment Lower Extremity Assessment: Overall WFL for tasks assessed    Cervical / Trunk Assessment Cervical / Trunk Assessment: Normal  Communication   Communication: No difficulties  Cognition Arousal/Alertness: Awake/alert Behavior During Therapy: WFL for tasks assessed/performed Overall Cognitive Status: Within Functional Limits for tasks assessed  General Comments General comments (skin integrity, edema, etc.): Pt able to place his own shoes on as well as went to bathroom and urinated by himself.     Exercises     Assessment/Plan    PT Assessment Patent does not need any further PT services  PT Problem List Decreased mobility       PT Treatment Interventions Functional mobility training;Gait training;Patient/family education    PT Goals  (Current goals can be found in the Care Plan section)  Acute Rehab PT Goals Patient Stated Goal: to go home PT Goal Formulation: All assessment and education complete, DC therapy    Frequency     Barriers to discharge        Co-evaluation               AM-PAC PT "6 Clicks" Mobility  Outcome Measure Help needed turning from your back to your side while in a flat bed without using bedrails?: None Help needed moving from lying on your back to sitting on the side of a flat bed without using bedrails?: None Help needed moving to and from a bed to a chair (including a wheelchair)?: None Help needed standing up from a chair using your arms (e.g., wheelchair or bedside chair)?: None Help needed to walk in hospital room?: None Help needed climbing 3-5 steps with a railing? : None 6 Click Score: 24    End of Session   Activity Tolerance: Patient tolerated treatment well Patient left: in chair;with call bell/phone within reach Nurse Communication: Mobility status PT Visit Diagnosis: Muscle weakness (generalized) (M62.81)    Time: 0946-1000 PT Time Calculation (min) (ACUTE ONLY): 14 min   Charges:   PT Evaluation $PT Eval Low Complexity: 1 Low          Khalaya Mcgurn W,PT Acute Rehabilitation Services Pager:  [phone]  Office:  [place] 05/16/2019, 10:34 AM

## 2019-05-16 NOTE — Consult Note (Signed)
ORTHOPAEDIC CONSULTATION  REQUESTING PHYSICIAN: Kerney Elbe, DO  Chief Complaint: Right forearm abscesses  HPI: Luke [name] is a 39 y.o. male who has a complicated medical history.  He is a current IVDU with polysubstance.  CT scan right forearm showed abscesses.  He underwent multiple I&Ds in 2014 by Dr. Caralyn Guile for a severe left forearm infections.  He denies any pain in the forearm.    Past Medical History:  Diagnosis Date  . Anemia   . Anxiety  Dx 2008  . Chronic kidney disease   . Diabetes mellitus without complication ([place]) Dx 6283  . DKA (diabetic ketoacidoses) ([place]) 07/21/2017  . GERD (gastroesophageal reflux disease) Dx 2008  . Headache(784.0)   . Hypertension   . Pneumonia   . Seizures ([place]) 2011    x 2 in lifetime. on Dilantin for a while.   . Substance abuse ([place]) 2013    heroin use, multiple relapses   Past Surgical History:  Procedure Laterality Date  . AMPUTATION Left 09/05/2018   Procedure: LEFT GREAT TOE AMPUTATION;  Surgeon: [name], MD;  Location: [place];  Service: Orthopedics;  Laterality: Left;  . I & D EXTREMITY Left 10/11/2012   Procedure: IRRIGATION AND DEBRIDEMENT ABSCESS FOREARM;  Surgeon: Linna Hoff, MD;  Location: [place];  Service: Orthopedics;  Laterality: Left;  . I & D EXTREMITY Left 10/12/2012   Procedure: IRRIGATION AND DEBRIDEMENT FOREARM;  Surgeon: Linna Hoff, MD;  Location: [place] [place];  Service: Orthopedics;  Laterality: Left;  . I & D EXTREMITY Left 10/14/2012   Procedure: incision and drainage left forearm;  Surgeon: Linna Hoff, MD;  Location: [facility];  Service: Orthopedics;  Laterality: Left;  . I & D EXTREMITY Left 10/16/2012   Procedure: IRRIGATION AND DEBRIDEMENT LEFT FOREARM;  Surgeon: Linna Hoff, MD;  Location: [facility];  Service: Orthopedics;  Laterality: Left;  . I & D EXTREMITY Left 10/20/2012   Procedure: INCISION AND DRAINAGE AND DEBRIDEMENT LEFT  FOREARM;  Surgeon: Linna Hoff, MD;  Location: [facility];  Service:  Orthopedics;  Laterality: Left;  . INCISION AND DRAINAGE OF WOUND Bilateral 11/29/2017   Procedure: DEBRIDEMENT BILATERAL FEET, DEBRIDEMENT LEFT ANKLE, AND APPLY WOUND VAC;  Surgeon: [name], MD;  Location: [facility];  Service: Orthopedics;  Laterality: Bilateral;  . IRRIGATION AND DEBRIDEMENT ABSCESS     Hx: of left arm abscess related to drug use   . TEE WITHOUT CARDIOVERSION N/A 11/28/2017   Procedure: TRANSESOPHAGEAL ECHOCARDIOGRAM (TEE);  Surgeon: Sanda Klein, MD;  Location: [place];  Service: Cardiovascular;  Laterality: N/A;  . TEE WITHOUT CARDIOVERSION N/A 05/12/2019   Procedure: TRANSESOPHAGEAL ECHOCARDIOGRAM (TEE);  Surgeon: Skeet Latch, MD;  Location: [facility] END[facility];  Service: Cardiovascular;  Laterality: N/A;   Social History   Socioeconomic History  . Marital status: Married    Spouse name: Not on file  . Number of children: 5  . Years of education: some colle  . Highest education level: Not on file  Occupational History  . Occupation: Event set up    Employer: [employer]   . Occupation: Stencil for cars   Tobacco Use  . Smoking status: Current Every Day Smoker    Packs/day: 1.00    Types: Cigarettes  . Smokeless tobacco: Never Used  Substance and Sexual Activity  . Alcohol use: No    Alcohol/week: 0.0 standard drinks    Comment: occasional  . Drug use:  Not Currently    Types: Cocaine, IV, Heroin    Comment: IVDU since Dad died in 2006/06/04 with 15 month period of sobriety 2018-2019, relapse Aug 2019.  Marland Kitchen Sexual activity: Yes    Partners: Female    Birth control/protection: None  Other Topics Concern  . Not on file  Social History Narrative   Lives with wife and two children age 55 and 36.          Social Determinants of Health   Financial Resource Strain:   . Difficulty of Paying Living Expenses: Not on file  Food Insecurity:   . Worried About Charity fundraiser in the Last Year: Not on file  . Ran Out of Food in the  Last Year: Not on file  Transportation Needs:   . Lack of Transportation (Medical): Not on file  . Lack of Transportation (Non-Medical): Not on file  Physical Activity:   . Days of Exercise per Week: Not on file  . Minutes of Exercise per Session: Not on file  Stress:   . Feeling of Stress : Not on file  Social Connections:   . Frequency of Communication with Friends and Family: Not on file  . Frequency of Social Gatherings with Friends and Family: Not on file  . Attends Religious Services: Not on file  . Active Member of Clubs or Organizations: Not on file  . Attends Archivist Meetings: Not on file  . Marital Status: Not on file   Family History  Problem Relation Age of Onset  . Diabetes Father   . Heart disease Father   . Mental illness Sister   . Cancer Neg Hx    - negative except otherwise stated in the family history section No Known Allergies Prior to Admission medications   Medication Sig Start Date End Date Taking? Authorizing Provider  acetaminophen (TYLENOL) 325 MG tablet Take 2 tablets (650 mg total) by mouth every 6 (six) hours as needed for mild pain (or Fever >/= 101). 12/19/16  Yes Debbe Odea, MD  amLODipine (NORVASC) 5 MG tablet Take 1 tablet (5 mg total) by mouth daily. 04/20/19  Yes Gildardo Pounds, NP  atorvastatin (LIPITOR) 20 MG tablet Take 1 tablet (20 mg total) by mouth daily. 04/20/19  Yes Gildardo Pounds, NP  DULoxetine (CYMBALTA) 30 MG capsule TAKE 1 CAPSULE (30 MG TOTAL) BY MOUTH DAILY. 04/03/19 05/08/28 Yes Charlott Rakes, MD  gabapentin (NEURONTIN) 400 MG capsule Take 1 capsule (400 mg total) by mouth 3 (three) times daily. 04/20/19 05/20/19 Yes Gildardo Pounds, NP  Insulin Glargine (LANTUS SOLOSTAR) 100 UNIT/ML Solostar Pen INJECT 40 UNITS INTO THE SKIN 2 (TWO) TIMES DAILY. Patient taking differently: Inject 40 Units into the skin 2 (two) times daily.  04/20/19  Yes Gildardo Pounds, NP  insulin lispro (HUMALOG KWIKPEN) 100 UNIT/ML KwikPen Inject  0.04-0.2 mLs (4-20 Units total) into the skin 3 (three) times daily. Per sliding scale 04/20/19  Yes Gildardo Pounds, NP  levothyroxine (SYNTHROID) 50 MCG tablet TAKE 1 TABLET (50 MCG TOTAL) BY MOUTH DAILY. 04/03/19  Yes Charlott Rakes, MD  lisinopril (ZESTRIL) 10 MG tablet Take 1 tablet (10 mg total) by mouth daily. 10/01/18 05/08/28 Yes Gildardo Pounds, NP  omeprazole (PRILOSEC) 20 MG capsule Take 1 capsule (20 mg total) by mouth daily. 04/20/19 07/19/19 Yes Gildardo Pounds, NP  sertraline (ZOLOFT) 50 MG tablet TAKE 1 TABLET (50 MG TOTAL) BY MOUTH DAILY. 12/19/18  Yes Charlott Rakes, MD  Blood Glucose Monitoring Suppl (TRUE METRIX METER) DEVI 1 kit by Does not apply route as directed. Use as directed 06/07/15   Boykin Nearing, MD  glucose blood test strip Use as instructed 04/20/19   Gildardo Pounds, NP  Insulin Syringe-Needle U-100 (TRUEPLUS INSULIN SYRINGE) 31G X 5/16" 0.5 ML MISC USE AS DIRECTED. 04/20/19   Gildardo Pounds, NP  TRUEPLUS LANCETS 28G MISC Use as directed 01/28/17   Alfonse Spruce, FNP   CT FOREARM RIGHT W CONTRAST  Result Date: 05/15/2019 CLINICAL DATA:  History of IV drug use, forearm pain EXAM: CT OF THE UPPER RIGHT EXTREMITY WITH CONTRAST TECHNIQUE: Multidetector CT imaging of the upper right extremity was performed according to the standard protocol following intravenous contrast administration. COMPARISON:  None. CONTRAST:  130m OMNIPAQUE IOHEXOL 300 MG/ML  SOLN FINDINGS: Bones/Joint/Cartilage No fracture or dislocation. No periosteal reaction or cortical destruction is seen. The joint spaces appear to be well maintained. Ligaments Suboptimally assessed by CT. Muscles and Tendons There is heterogeneous edema seen within the anterior(volar) medial muscular compartment of the forearm at the midshaft of the ulna. The muscles are otherwise intact. Limited visualization of the tendons, however the portions that are visualized are intact. Soft tissues At the anterior medial aspect of  the forearm at the level of the midshaft of the ulna there are 3 separate multilocular peripherally enhancing fluid collections within the subcutaneous soft tissues the first measures 1.3 x 1.2 by 1.5 cm and best seen on series 5, image 55. The second also at the anteromedial aspect of the proximal ulnar shaft slightly more posterior measuring 1.5 x 1.1 by 2.1 cm best seen on series 5, image 61. The third multilocular collection is at the proximal anteromedial aspect of the ulnar shaft and measuring approximately a 2.8 x 0.8 x 1.6 cm best seen on series 5, image 80. There is diffuse subcutaneous edema seen along the proximal to mid forearm with diffuse skin thickening and overlying skin irregularity. IMPRESSION: At least 3 separate multilocular soft tissue abscesses along the anteromedial aspect of the proximal to mid forearm as described above within the subcutaneous soft tissues. Overlying cellulitis and skin thickening. No definite evidence of osteomyelitis. Electronically Signed   By: BPrudencio PairM.D.   On: 05/15/2019 19:17   - pertinent xrays, CT, MRI studies were reviewed and independently interpreted  Positive ROS: All other systems have been reviewed and were otherwise negative with the exception of those mentioned in the HPI and as above.  Physical Exam: General: Alert, no acute distress Cardiovascular: No pedal edema Respiratory: No cyanosis, no use of accessory musculature GI: No organomegaly, abdomen is soft and non-tender Skin: No lesions in the area of chief complaint Neurologic: Sensation intact distally Psychiatric: Patient is competent for consent with normal mood and affect Lymphatic: No axillary or cervical lymphadenopathy  MUSCULOSKELETAL:  Right forearm  - multiple necrotic wounds - innumerable track marks - indurated skin throughout forearm with pitting edema  Assessment: RUE abscesses and multiple necrotic wounds  Plan: CT scan c/w abscesses Plan is for I&D in OR of  abscesses and necrotic wounds, VAC Discussed with patient that he will require multiple surgeries and is at risk for losing his arm  Thank you for the consult and the opportunity to see Mr. EMaccoy Haubner MEduard Roux MD [facility]11:11 AM

## 2019-05-16 NOTE — Progress Notes (Signed)
Patient requesting to be discharged against medical advice, MD made aware, patient signed his AMA form and impatiently waiting for IV team to remove his PICC. Claimed that he can always go back or go to another hospital and get treatment again.

## 2019-05-16 NOTE — Discharge Summary (Signed)
Physician Discharge Summary  TYREZ BERRIOS GYI:[id number] DOB: November 23, 1980 DOA: 05/08/2019  PCP: Gildardo Pounds, NP  Admit date: 05/08/2019 Discharge date: 05/16/2019  Admitted From: Home Disposition: LEFT AMA  Recommendations for Outpatient Follow-up:  1. Follow up with PCP in 1-2 weeks  2. Follow up with Infectious Diseases  3. Please obtain CMP, CBC, Mag, Phos in one week 4. Please follow up on the following pending results:  Home Health: No Equipment/Devices: None  Discharge Condition: Guarded CODE STATUS: FULL CODE Diet recommendation: Heart Healthy Carb Modified Diet   Brief/Interim Summary: [name] a 39 y.o.malewith a history of polysubstance use, IVDU with heroin, DM1 and DKA, HTN, prior osteomyelitis of the ankle s/p left great toe amputationwho was admittedfor bacteremia. Had recent positive strep viridans blood cultures on 12/31 during recent admission but patient left AMA. Lost to follow up until he initiated a telehealth visit for recurrent skin infections on 2/1, then on 2/14 was notified that he had positive blood cultures from 12/31 and was told to go to the ER. Began having chest pain and shortness of breath prompting his admission on 2/20. started on empiric vancomycin/cefepime.  2/21 TTE showed AV/MV vegetations, infectious disease and cardiology consulted. 2/23: TEE done and showed an EF of 65% from large, cystlike structure on the A2 segment and mobile, filamentous structure in the P1 region of the mitral valve.  Which was new compared to the findings on the TEE back in 11/2017.  2/24 Cardiothoracic surgery was consulted and patient had a PICC line placed yesterday.  ID has changed him to gentamicin and penicillin G and will continue antibiotic recommendations per ID.  2/25: The patient had no complaints and was happy that he did not need surgical intervention at this time therapy and reassess the bowel function 6 weeks and after completion of drug  rehab with an echocardiogram.  Patient's blood sugars were uncontrolled so his regimen is being adjusted.  ID recommends continuing current synergistic treatment with gentamicin and penicillin.  Patient's Panorex did show a lucency in the right tooth and right maxilla.  2/26: Because of his persistent leukocytosis is of the asymmetry in his arms with his left arm being smaller than his right the infectious disease team is going to CT scan his arm and have wound care further assess.  Patient is also globally weak so we will obtain a PT and OT evaluation.  His blood sugars remain somewhat uncontrolled so Lantus has been increased to 48 units  2/27 CT scan of his right forearm did show several abscesses so orthopedic surgery was consulted for further evaluation recommendations and was planning to take the patient for an I&D in the OR of his abscesses necrotic wound with a wound VAC placement.  Dr. Erlinda Hong gust with the patient that he will likely require multiple surgeries and is at risk for losing his arm.  Because his PICC line was in that arm we are going to move it to an IJ central catheter in the OR tomorrow as left PICC line would not be beneficial.  Patient was calm and cooperative during entire morning but then got upset and agitated and signed out [place] and was of sound mind when he signed out [place].  He will need continued close monitoring and will need close follow-up given that he is at very high risk of readmission given his multiple medical comorbidities, his endocarditis, as well as his uncontrolled diabetes mellitus.  Discharge Diagnoses:  Principal Problem:   Sepsis ([place]) Active Problems:   Drug abuse, IV ([place])   Essential hypertension   CKD (chronic kidney disease) stage 3, GFR 30-59 ml/min   Cellulitis   Diabetic polyneuropathy associated with type 1 diabetes mellitus ([name])   Hepatitis C   Hypothyroidism   Heroin abuse ([place])   Acute bacterial  endocarditis  Sepsis Secondary to 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, in setting of IVDUsepsis resolved -Hx of strep viridans bacteremiadiagnosed12/31/20, left AMA and was not treated -WBC at started increasing but now slightly decreased and is down from 20,000 and now is 14,900 -ID gave oritavancin x1 and changed to ceftriaxone and now is on Gentamicin 210 mg q24h and Pen G and will need 2 weeks of it; will need close monitoring of his renal function his creatinine is elevated slightly today at 131 -Asked TCTS to evaluate given his Endocarditis and Moderate Mitral Regurgitation they recommend medical management and conservative therapy at this point and repeating echocardiogram at the end of therapy as well as completion of a drug rehab -TEE showed a large cystlike structure on the A2 segment mobile filamentous on the A2 segment and mobile on the P1 region of the mitral valve along with moderate mitral regurg and no mitral stenosis.  No evidence of endocarditis of the aortic valve and no LA/LAA thrombus or mass.  There is no ASD or PFO noted. -Appreciate further ID recommendations and there ordering an orthopantogram given that the patient told him that he was having dental pain but he denied anything to me -His orthopantogram showed "Probable dental carie involving tooth #12 in the right maxilla.  No obvious periapical lucency." -May need to to reach out to in-house dentistry Dr. Darrick Grinder for further evaluation while patient is hospitalized here -They feel that the patient's persistent leukocytosis may be related to the large vegetation described with some altered negative seated area that we have yet to realize -ID has ordered a AP chest x-ray for evaluation of possible septic emboli and there is no tricuspid valve involvement in the TEE so they are deferring CT for now; his repeat chest x-ray showed no acute cardiopulmonary process -Because  patient still has a persistent leukocytosis and he has asymmetry in his arms with his right arm being slightly bigger than his left infectious disease has ordered some imaging of his right arm with a CT scan and I have also recommended wound care, reevaluate. -CT scan done and showed "At least 3 separate multilocular soft tissue abscesses along the anteromedial aspect of the proximal to mid forearm as described above within the subcutaneous soft tissues. Overlying cellulitis and skin thickening. No definite evidence of osteomyelitis." -Orthopedic surgery Dr. Erlinda Hong was consulted and was planning on taking the patient to the OR tomorrow for an incision and drainage with likely wound VAC however the patient subsequently signed out [place]  Bilateral upper extremitywounds present on admission, likely secondary to IVDU -Leading to difficulty of IV access -Wound care -PICC line is now in place and was going to be changed to an IJ CVC tomorrow -as above CT scan of the right forearm was done -Unfortunately cannot get IV access in the left due to his significant scarring and midline is not a long-term option but nonetheless the patient has signed out [place] and PICC line removed was removed prior to his leaving  DM1 with episode of Hypoglycemia/Hyperglycemia  -He has had several episodes of hypoglycemia and hyperglycemia and  his blood sugars have been extremely variable -HA1c in December 10.0 -He had missed his Lantus for 2 days and unclear why but this is been restarted  -His Lantus is increased for the last few days and increased from 30 units now to 36 now to further 48 and have increased his mealtime insulin to 10 units 3 times daily with meals and also change to sliding scale from sensitive to moderate NovoLog sliding scale before meals and at bedtime -Continue to monitor CBGs carefully and adjust as necessary -CBGs have been fluctuating went from 73-455  AKI on CKD  3 -Currently 2.03on admission, currently at baseline and improved initially but slightly worsened again -The patient's BUN/creatinine went from 16/1.29 yesterday and today it was 18/1.62 in the setting of his gentamicin -Continue Holding lisinopril -Continue to Monitor and Trend Renal Fxn now that he is getting gentamicin for antibiotics -Repeat CMP in AM but the patient subsequently signed out AMA  Hypothyroidism -Stable -C/w Levothyroxine 50 mcg po Daily   HTN -Continue Amlodipine 5 mg po Daily  -Holding Lisinopril for now  Right lower extremity calf tenderness/swelling -RLE [place]: No DVT, bilateral groin lymphadenopathy noted -? Cause of his Leukocytosis -May consider further imaging if nothing else is revealing -We will call PT OT to further evaluate and treat patient subsequently signed out AMA  IVDU -Wishes to quit -Started on Suboxone this admission by prior Physician and is now on Buprenorphine-Naloxone 8-2 mg per SL 1 tab SL BID and has breakthrough pain control with oxycodone -Follow up with SW but he signed out AGAINST MEDICAL ADVICE  GERD -C/w PPI Pantoprazole 40 mg po Daily   Anxiety  -C/w Sertraline 50 mg po Daily, Duloxetine 400 mg po TID,   HLD -C/w Atorvastatin 20 mg po Daily   Back Pain -on Suboxone as above -Added oxycodone and will need judicious narcotic pain control And also added Robaxin and a K pad as well as a lidocaine patch next-continue to monitor and if continues to worsen will need MRI imaging -Patient signed out AGAINST MEDICAL ADVICE  Microcytic Anemia -Stable -Patient's hemoglobin/hematocrit stable is now 9.2/20.8 -Anemia panel was done and showed an iron level of 12, U IBC 236, TIBC of 248, saturation ratios of 5%, ferritin level 150 -Continue to monitor for signs and symptoms of bleeding; currently no overt bleeding noted -Repeat CBC in a.m.  Thrombocytosis -Likely reactive in the setting of his infection and antibiotic  usage -Patient's platelet count is now  549,000 -Continue to monitor and trend repeat CBC in a.m.  Hepatitis C -The genotype of 1A and RNA level of 10,500,000 back in 2019 -Repeat RNA was 5,170,000 with a HCV quantitative log of 6.713 and his hepatitis B surface antigen was negative nonreactive -Follow-up with ID in the clinic if so desired by patient but he signed out AMA  Hyperphosphatemia -Patient's phosphorus level was 4.5 today  -continue to monitor and trend and monitor repeat in a.m. but patient signed out [facility]  Discharge Instructions  Allergies as of 05/16/2019   No Known Allergies     Medication List    TAKE these medications   acetaminophen 325 MG tablet Commonly known as: TYLENOL Take 2 tablets (650 mg total) by mouth every 6 (six) hours as needed for mild pain (or Fever >/= 101).   amLODipine 5 MG tablet Commonly known as: NORVASC Take 1 tablet (5 mg total) by mouth daily.   atorvastatin 20 MG tablet Commonly known as: LIPITOR Take 1 tablet (  20 mg total) by mouth daily.   DULoxetine 30 MG capsule Commonly known as: CYMBALTA TAKE 1 CAPSULE (30 MG TOTAL) BY MOUTH DAILY.   gabapentin 400 MG capsule Commonly known as: NEURONTIN Take 1 capsule (400 mg total) by mouth 3 (three) times daily.   glucose blood test strip Use as instructed   insulin lispro 100 UNIT/ML KwikPen Commonly known as: HumaLOG KwikPen Inject 0.04-0.2 mLs (4-20 Units total) into the skin 3 (three) times daily. Per sliding scale   Insulin Syringe-Needle U-100 31G X 5/16" 0.5 ML Misc Commonly known as: TRUEplus Insulin Syringe USE AS DIRECTED.   Lantus SoloStar 100 UNIT/ML Solostar Pen Generic drug: Insulin Glargine INJECT 40 UNITS INTO THE SKIN 2 (TWO) TIMES DAILY. What changed:   how much to take  how to take this  when to take this  additional instructions   levothyroxine 50 MCG tablet Commonly known as: SYNTHROID TAKE 1 TABLET (50 MCG TOTAL) BY MOUTH DAILY.   lisinopril  10 MG tablet Commonly known as: ZESTRIL Take 1 tablet (10 mg total) by mouth daily.   omeprazole 20 MG capsule Commonly known as: PRILOSEC Take 1 capsule (20 mg total) by mouth daily.   sertraline 50 MG tablet Commonly known as: ZOLOFT TAKE 1 TABLET (50 MG TOTAL) BY MOUTH DAILY.   True Metrix Meter Devi 1 kit by Does not apply route as directed. Use as directed   TRUEplus Lancets 28G Misc Use as directed       No Known Allergies  Consultations:  Infectious Diseases  Cardiology  TCTS  Orthopedic surgery   Procedures/Studies: DG Orthopantogram  Result Date: 05/13/2019 CLINICAL DATA:  Endocarditis of mitral valve. EXAM: ORTHOPANTOGRAM/PANORAMIC COMPARISON:  None. FINDINGS: Lucencies are noted involving tooth #12 involving the right maxilla. No obvious periapical lucencies. The patient is status post prior multiple tooth extraction. IMPRESSION: 1. Probable dental carie involving tooth #12 in the right maxilla. 2. No obvious periapical lucency. Electronically Signed   By: Constance Holster M.D.   On: 05/13/2019 15:12   DG Chest 2 View  Result Date: 05/13/2019 CLINICAL DATA:  Endocarditis. EXAM: CHEST - 2 VIEW COMPARISON:  05/08/2019 FINDINGS: The heart size and mediastinal contours are within normal limits. Both lungs are clear. The visualized skeletal structures are unremarkable. There is a newly placed right-sided PICC line with tip terminating near the cavoatrial junction. IMPRESSION: No active cardiopulmonary disease. Electronically Signed   By: Constance Holster M.D.   On: 05/13/2019 15:13   DG Chest 2 View  Result Date: 05/08/2019 CLINICAL DATA:  Suspected sepsis. EXAM: CHEST - 2 VIEW COMPARISON:  12/17/2016 FINDINGS: The heart size and mediastinal contours are within normal limits. Both lungs are clear. The visualized skeletal structures are unremarkable. There is stable height loss of the T11 vertebral body. There is stable height loss T10 vertebral body. IMPRESSION:  No active cardiopulmonary disease. Electronically Signed   By: Constance Holster M.D.   On: 05/08/2019 22:29   CT FOREARM RIGHT W CONTRAST  Result Date: 05/15/2019 CLINICAL DATA:  History of IV drug use, forearm pain EXAM: CT OF THE UPPER RIGHT EXTREMITY WITH CONTRAST TECHNIQUE: Multidetector CT imaging of the upper right extremity was performed according to the standard protocol following intravenous contrast administration. COMPARISON:  None. CONTRAST:  113m OMNIPAQUE IOHEXOL 300 MG/ML  SOLN FINDINGS: Bones/Joint/Cartilage No fracture or dislocation. No periosteal reaction or cortical destruction is seen. The joint spaces appear to be well maintained. Ligaments Suboptimally assessed by CT. Muscles and Tendons There  is heterogeneous edema seen within the anterior(volar) medial muscular compartment of the forearm at the midshaft of the ulna. The muscles are otherwise intact. Limited visualization of the tendons, however the portions that are visualized are intact. Soft tissues At the anterior medial aspect of the forearm at the level of the midshaft of the ulna there are 3 separate multilocular peripherally enhancing fluid collections within the subcutaneous soft tissues the first measures 1.3 x 1.2 by 1.5 cm and best seen on series 5, image 55. The second also at the anteromedial aspect of the proximal ulnar shaft slightly more posterior measuring 1.5 x 1.1 by 2.1 cm best seen on series 5, image 61. The third multilocular collection is at the proximal anteromedial aspect of the ulnar shaft and measuring approximately a 2.8 x 0.8 x 1.6 cm best seen on series 5, image 80. There is diffuse subcutaneous edema seen along the proximal to mid forearm with diffuse skin thickening and overlying skin irregularity. IMPRESSION: At least 3 separate multilocular soft tissue abscesses along the anteromedial aspect of the proximal to mid forearm as described above within the subcutaneous soft tissues. Overlying cellulitis  and skin thickening. No definite evidence of osteomyelitis. Electronically Signed   By: Prudencio Pair M.D.   On: 05/15/2019 19:17   ECHOCARDIOGRAM COMPLETE  Result Date: 05/10/2019    ECHOCARDIOGRAM REPORT   Patient Name:   SIRIS HOOS Date of Exam: 05/09/2019 Medical Rec #:  [id number]    Height:       69.0 in Accession #:    [id number]   Weight:       176.4 lb Date of Birth:  06/25/1980    BSA:          1.958 m Patient Age:    39 years     BP:           144/90 mmHg Patient Gender: M            HR:           116 bpm. Exam Location:  Inpatient Procedure: 2D Echo Indications:    Endocarditis I38  History:        Patient has prior history of Echocardiogram examinations. Risk                 Factors:Diabetes, Hypertension and Current Smoker. Drug abuse.                 CKD. Sepsis. [place]ion abuse.  Sonographer:    Clayton Lefort RDCS (AE) Referring Phys: [place]  1. Left ventricular ejection fraction, by estimation, is 60 to 65%. The left ventricle has normal function. The left ventricle has no regional wall motion abnormalities. There is moderate concentric left ventricular hypertrophy. Left ventricular diastolic parameters were normal.  2. Right ventricular systolic function is normal. The right ventricular size is normal. There is normal pulmonary artery systolic pressure.  3. Left atrial size was moderately dilated.  4. Moderate vegetation on the mitral valve.  5. Vegetation noted on the anterior mitral valve leaflet. Recommend TEE to better evaluate.. The mitral valve is normal in structure and function. Trivial mitral valve regurgitation. No evidence of mitral stenosis.  6. Abnromal thickening of the right and non-coronary cusps concerning for endocarditis. Recommend TEE to better evaluate.. The aortic valve is abnormal. Aortic valve regurgitation is mild to moderate. No aortic stenosis is present.  7. Aortic valve vegetation is visualized on the right and noncoronary.  8. The inferior  vena  cava is normal in size with greater than 50% respiratory variability, suggesting right atrial pressure of 3 mmHg. FINDINGS  Left Ventricle: Left ventricular ejection fraction, by estimation, is 60 to 65%. The left ventricle has normal function. The left ventricle has no regional wall motion abnormalities. The left ventricular internal cavity size was normal in size. There is  moderate concentric left ventricular hypertrophy. Left ventricular diastolic parameters were normal. Right Ventricle: The right ventricular size is normal. No increase in right ventricular wall thickness. Right ventricular systolic function is normal. There is normal pulmonary artery systolic pressure. The tricuspid regurgitant velocity is 1.47 m/s, and  with an assumed right atrial pressure of 3 mmHg, the estimated right ventricular systolic pressure is 07.6 mmHg. Left Atrium: Left atrial size was moderately dilated. Right Atrium: Right atrial size was normal in size. Pericardium: There is no evidence of pericardial effusion. Mitral Valve: Vegetation noted on the anterior mitral valve leaflet. Recommend TEE to better evaluate. The mitral valve is normal in structure and function. Normal mobility of the mitral valve leaflets. A moderate vegetation is seen on the anterior mitral leaflet. The MV vegetation measures 18 mm x 14 mm. Trivial mitral valve regurgitation. No evidence of mitral valve stenosis. MV peak gradient, 5.4 mmHg. The mean mitral valve gradient is 3.0 mmHg. Tricuspid Valve: Cannot rule out tricuspid valve vegetation. The tricuspid valve is normal in structure. Tricuspid valve regurgitation is mild . No evidence of tricuspid stenosis. Aortic Valve: Abnromal thickening of the right and non-coronary cusps concerning for endocarditis. Recommend TEE to better evaluate. The aortic valve is abnormal. Aortic valve regurgitation is mild to moderate. Aortic regurgitation PHT measures 229 msec.  No aortic stenosis is present. Aortic valve  mean gradient measures 6.0 mmHg. Aortic valve peak gradient measures 13.2 mmHg. Aortic valve area, by VTI measures 2.54 cm. A vegetation is seen on the right and noncoronary. Pulmonic Valve: The pulmonic valve was normal in structure. Pulmonic valve regurgitation is not visualized. No evidence of pulmonic stenosis. Aorta: The aortic root is normal in size and structure. Venous: The inferior vena cava is normal in size with greater than 50% respiratory variability, suggesting right atrial pressure of 3 mmHg. IAS/Shunts: No atrial level shunt detected by color flow Doppler.  LEFT VENTRICLE PLAX 2D LVIDd:         3.90 cm  Diastology LVIDs:         2.55 cm  LV e' lateral:   9.46 cm/s LV PW:         1.40 cm  LV E/e' lateral: 11.7 LV IVS:        1.50 cm  LV e' medial:    10.70 cm/s LVOT diam:     2.00 cm  LV E/e' medial:  10.4 LV SV:         64.72 ml LV SV Index:   33.04 LVOT Area:     3.14 cm  RIGHT VENTRICLE             IVC RV Basal diam:  3.20 cm     IVC diam: 1.50 cm RV S prime:     17.00 cm/s TAPSE (M-mode): 1.3 cm LEFT ATRIUM             Index       RIGHT ATRIUM          Index LA diam:        2.70 cm 1.38 cm/m  RA Area:     9.75 cm  LA Vol (A2C):   49.7 ml 25.38 ml/m RA Volume:   15.80 ml 8.07 ml/m LA Vol (A4C):   66.8 ml 34.11 ml/m LA Biplane Vol: 58.3 ml 29.77 ml/m  AORTIC VALVE AV Area (Vmax):    2.45 cm AV Area (Vmean):   2.62 cm AV Area (VTI):     2.54 cm AV Vmax:           182.00 cm/s AV Vmean:          120.000 cm/s AV VTI:            0.255 m AV Peak Grad:      13.2 mmHg AV Mean Grad:      6.0 mmHg LVOT Vmax:         142.00 cm/s LVOT Vmean:        100.000 cm/s LVOT VTI:          0.206 m LVOT/AV VTI ratio: 0.81 AI PHT:            229 msec  AORTA Ao Root diam: 2.50 cm Ao Asc diam:  2.80 cm MITRAL VALVE                TRICUSPID VALVE MV Area (PHT): 5.84 cm     TR Peak grad:   8.6 mmHg MV Peak grad:  5.4 mmHg     TR Vmax:        147.00 cm/s MV Mean grad:  3.0 mmHg MV Vmax:       1.16 m/s     SHUNTS MV  Vmean:      80.4 cm/s    Systemic VTI:  0.21 m MV Decel Time: 130 msec     Systemic Diam: 2.00 cm MV E velocity: 111.00 cm/s MV A velocity: 113.00 cm/s MV E/A ratio:  0.98 Skeet Latch MD Electronically signed by Skeet Latch MD Signature Date/Time: 05/10/2019/8:33:46 AM    Final    ECHO TEE  Result Date: 05/12/2019    TRANSESOPHOGEAL ECHO REPORT   Patient Name:   JOSUHA FONTANEZ Date of Exam: 05/12/2019 Medical Rec #:  [id number]    Height:       69.0 in Accession #:    [id number]   Weight:       180.0 lb Date of Birth:  08-May-1980    BSA:          1.976 m Patient Age:    2 years     BP:           113/80 mmHg Patient Gender: M            HR:           82 bpm. Exam Location:  Inpatient Procedure: Transesophageal Echo Indications:     endocarditis  History:         Patient has prior history of Echocardiogram examinations, most                  recent 05/09/2019. Signs/Symptoms:Bacteremia.  Sonographer:     Johny Chess Referring Phys:  [id number] [place] Diagnosing Phys: Skeet Latch MD PROCEDURE: The transesophogeal probe was passed without difficulty through the esophogus of the patient. Sedation performed by different physician. The patient was monitored while under deep sedation. Anesthestetic sedation was provided intravenously by Anesthesiology: '412mg'$  of Propofol, '40mg'$  of Lidocaine. The patient's vital signs; including heart rate, blood pressure, and oxygen saturation; remained stable throughout the procedure. The patient developed no complications during the procedure. IMPRESSIONS  1. Left ventricular ejection fraction, by estimation, is 55 to 60%. The left ventricle has normal function. The left ventricle has no regional wall motion abnormalities. There is mild concentric left ventricular hypertrophy. Left ventricular diastolic function could not be evaluated.  2. Right ventricular systolic function is normal. The right ventricular size is normal.  3. No left atrial/left atrial appendage  thrombus was detected.  4. There is a large (1.04 cm x 1.54 cm) round, well-circumscribed echodensity on the anterior (A2) mitral valve leaflet. There is also a mobile, filamentous lesion on the posterior (P1) leaflet (1.46 cm x 0.33 cm). Both are new since the TEE in 2019. Given the well-circumscribed nature of the anterior leaflet abnormality, suspect this may reflection prior endocarditis. The filamentous posterior leaflet finding is more consistent with acute endocarditis. The mitral valve is normal in structure and function. Mild mitral valve regurgitation. No evidence of mitral stenosis.  5. No evidence of aortic valve endocarditis. The aortic valve is normal in structure and function. Aortic valve regurgitation is mild to moderate. No aortic stenosis is present.  6. The inferior vena cava is normal in size with greater than 50% respiratory variability, suggesting right atrial pressure of 3 mmHg. Conclusion(s)/Recommendation(s): Normal biventricular function without evidence of hemodynamically significant valvular heart disease. Findings are concerning for vegetation/infective endocarditis as detailed above. FINDINGS  Left Ventricle: Left ventricular ejection fraction, by estimation, is 55 to 60%. The left ventricle has normal function. The left ventricle has no regional wall motion abnormalities. The left ventricular internal cavity size was normal in size. There is  mild concentric left ventricular hypertrophy. Right Ventricle: The right ventricular size is normal. No increase in right ventricular wall thickness. Right ventricular systolic function is normal. Left Atrium: Left atrial size was normal in size. No left atrial/left atrial appendage thrombus was detected. Right Atrium: Right atrial size was normal in size. Pericardium: There is no evidence of pericardial effusion. Mitral Valve: There is a large (1.04 cm x 1.54 cm) round, well-circumscribed echodensity on the anterior (A2) mitral valve leaflet.  There is also a mobile, filamentous lesion on the posterior (P1) leaflet (1.46 cm x 0.33 cm). Both are new since the TEE in 2019. Given the well-circumscribed nature of the anterior leaflet abnormality, suspect this may reflection prior endocarditis. The filamentous posterior leaflet finding is more consistent with acute endocarditis. The mitral valve is normal in structure and function. Normal mobility of the mitral valve leaflets. Mild mitral valve regurgitation. No evidence of mitral valve stenosis. MV peak gradient, 2.9 mmHg. The mean mitral valve gradient is 1.0 mmHg. Tricuspid Valve: The tricuspid valve is normal in structure. Tricuspid valve regurgitation is not demonstrated. No evidence of tricuspid stenosis. Aortic Valve: No evidence of aortic valve endocarditis. The aortic valve is normal in structure and function. Aortic valve regurgitation is mild to moderate. Aortic regurgitation PHT measures 811 msec. No aortic stenosis is present. Pulmonic Valve: The pulmonic valve was normal in structure. Pulmonic valve regurgitation is not visualized. No evidence of pulmonic stenosis. Aorta: The aortic root is normal in size and structure. Venous: The inferior vena cava is normal in size with greater than 50% respiratory variability, suggesting right atrial pressure of 3 mmHg. IAS/Shunts: No atrial level shunt detected by color flow Doppler.  AORTIC VALVE AI PHT:      811 msec MITRAL VALVE MV Peak grad: 2.9 mmHg MV Mean grad: 1.0 mmHg MV Vmax:      0.84 m/s MV Vmean:     51.0  cm/s Skeet Latch MD Electronically signed by Skeet Latch MD Signature Date/Time: 05/12/2019/2:30:37 PM    Final    VAS [place] LOWER EXTREMITY VENOUS (DVT)  Result Date: 05/12/2019  Lower Venous DVTStudy Indications: Pain, and Swelling. Other Indications: IVDU. Comparison Study: No prior study Performing Technologist: Maudry Mayhew MHA, RDMS, RVT, RDCS  Examination Guidelines: A complete evaluation includes B-mode imaging, spectral  Doppler, color Doppler, and power Doppler as needed of all accessible portions of each vessel. Bilateral testing is considered an integral part of a complete examination. Limited examinations for reoccurring indications may be performed as noted. The reflux portion of the exam is performed with the patient in reverse Trendelenburg.  +---------+---------------+---------+-----------+----------+--------------+ RIGHT    CompressibilityPhasicitySpontaneityPropertiesThrombus Aging +---------+---------------+---------+-----------+----------+--------------+ CFV      Full           Yes      Yes                                 +---------+---------------+---------+-----------+----------+--------------+ SFJ      Full                                                        +---------+---------------+---------+-----------+----------+--------------+ FV Prox  Full                                                        +---------+---------------+---------+-----------+----------+--------------+ FV Mid   Full                                                        +---------+---------------+---------+-----------+----------+--------------+ FV DistalFull                                                        +---------+---------------+---------+-----------+----------+--------------+ PFV      Full                                                        +---------+---------------+---------+-----------+----------+--------------+ POP      Full           Yes      Yes                                 +---------+---------------+---------+-----------+----------+--------------+ PTV      Full                                                        +---------+---------------+---------+-----------+----------+--------------+  PERO     Full                                                        +---------+---------------+---------+-----------+----------+--------------+ Gastroc  Full                     Yes                                 +---------+---------------+---------+-----------+----------+--------------+   +----+---------------+---------+-----------+----------+--------------+ LEFTCompressibilityPhasicitySpontaneityPropertiesThrombus Aging +----+---------------+---------+-----------+----------+--------------+ CFV Full           Yes      Yes                                 +----+---------------+---------+-----------+----------+--------------+     Summary: RIGHT: - There is no evidence of deep vein thrombosis in the lower extremity.  - No cystic structure found in the popliteal fossa. - Ultrasound characteristics of enlarged lymph nodes are noted in the groin.  LEFT: - No evidence of common femoral vein obstruction. - Ultrasound characteristics of enlarged lymph nodes noted in the groin.  *See table(s) above for measurements and observations. Electronically signed by Ruta Hinds MD on 05/12/2019 at 11:49:21 AM.    Final    [place] EKG SITE RITE  Result Date: 05/16/2019 If [facility] image not attached, placement could not be confirmed due to current cardiac rhythm.  [place] EKG SITE RITE  Result Date: 05/12/2019 If [facility] image not attached, placement could not be confirmed due to current cardiac rhythm.  [place] EKG SITE RITE  Result Date: 05/10/2019 If [facility] image not attached, placement could not be confirmed due to current cardiac rhythm.  Transesophageal Echo 05/12/19  Indications:   endocarditis    History:     Patient has prior history of Echocardiogram examinations,  most          recent 05/09/2019. Signs/Symptoms:Bacteremia.    Sonographer:   Johny Chess  Referring Phys: [id number] [place]  Diagnosing Phys: Skeet Latch MD   PROCEDURE: The transesophogeal probe was passed without difficulty through  the esophogus of the patient. Sedation performed by different physician.  The patient was monitored while under deep  sedation. Anesthestetic  sedation was provided intravenously by  Anesthesiology: '412mg'$  of Propofol, '40mg'$  of Lidocaine. The patient's vital  signs; including heart rate, blood pressure, and oxygen saturation;  remained stable throughout the procedure. The patient developed no  complications during the procedure.   IMPRESSIONS    1. Left ventricular ejection fraction, by estimation, is 55 to 60%. The  left ventricle has normal function. The left ventricle has no regional  wall motion abnormalities. There is mild concentric left ventricular  hypertrophy. Left ventricular diastolic  function could not be evaluated.  2. Right ventricular systolic function is normal. The right ventricular  size is normal.  3. No left atrial/left atrial appendage thrombus was detected.  4. There is a large (1.04 cm x 1.54 cm) round, well-circumscribed  echodensity on the anterior (A2) mitral valve leaflet. There is also a  mobile, filamentous lesion on the posterior (P1) leaflet (1.46 cm x 0.33  cm). Both are new since the TEE in 2019.  Given  the well-circumscribed nature of the anterior leaflet abnormality,  suspect this may reflection prior endocarditis. The filamentous posterior  leaflet finding is more consistent with acute endocarditis. The mitral  valve is normal in structure and  function. Mild mitral valve regurgitation. No evidence of mitral stenosis.  5. No evidence of aortic valve endocarditis. The aortic valve is normal  in structure and function. Aortic valve regurgitation is mild to moderate.  No aortic stenosis is present.  6. The inferior vena cava is normal in size with greater than 50%  respiratory variability, suggesting right atrial pressure of 3 mmHg.   Conclusion(s)/Recommendation(s): Normal biventricular function without  evidence of hemodynamically significant valvular heart disease. Findings  are concerning for vegetation/infective endocarditis as detailed above.    FINDINGS  Left Ventricle: Left ventricular ejection fraction, by estimation, is 55  to 60%. The left ventricle has normal function. The left ventricle has no  regional wall motion abnormalities. The left ventricular internal cavity  size was normal in size. There is  mild concentric left ventricular hypertrophy.   Right Ventricle: The right ventricular size is normal. No increase in  right ventricular wall thickness. Right ventricular systolic function is  normal.   Left Atrium: Left atrial size was normal in size. No left atrial/left  atrial appendage thrombus was detected.   Right Atrium: Right atrial size was normal in size.   Pericardium: There is no evidence of pericardial effusion.   Mitral Valve: There is a large (1.04 cm x 1.54 cm) round,  well-circumscribed echodensity on the anterior (A2) mitral valve leaflet.  There is also a mobile, filamentous lesion on the posterior (P1) leaflet  (1.46 cm x 0.33 cm). Both are new since the TEE  in 2019. Given the well-circumscribed nature of the anterior leaflet  abnormality, suspect this may reflection prior endocarditis. The  filamentous posterior leaflet finding is more consistent with acute  endocarditis. The mitral valve is normal in  structure and function. Normal mobility of the mitral valve leaflets. Mild  mitral valve regurgitation. No evidence of mitral valve stenosis. MV peak  gradient, 2.9 mmHg. The mean mitral valve gradient is 1.0 mmHg.   Tricuspid Valve: The tricuspid valve is normal in structure. Tricuspid  valve regurgitation is not demonstrated. No evidence of tricuspid  stenosis.   Aortic Valve: No evidence of aortic valve endocarditis. The aortic valve  is normal in structure and function. Aortic valve regurgitation is mild to  moderate. Aortic regurgitation PHT measures 811 msec. No aortic stenosis  is present.   Pulmonic Valve: The pulmonic valve was normal in structure. Pulmonic valve  regurgitation  is not visualized. No evidence of pulmonic stenosis.   Aorta: The aortic root is normal in size and structure.   Venous: The inferior vena cava is normal in size with greater than 50%  respiratory variability, suggesting right atrial pressure of 3 mmHg.   IAS/Shunts: No atrial level shunt detected by color flow Doppler.     AORTIC VALVE  AI PHT:   811 msec   MITRAL VALVE  MV Peak grad: 2.9 mmHg  MV Mean grad: 1.0 mmHg  MV Vmax:   0.84 m/s  MV Vmean:   51.0 cm/s   Subjective: Seen and examined this morning he states he is doing okay but was complaining of some back pain.  Still has not had a bowel movement this morning.  No nausea or vomiting.  I spoke to him about him having abscesses in call orthopedic surgery and orthopedics  discussed with him about going to the OR tomorrow.  Patient was initially agreeable but then subsequently became agitated about the [facility] health visitor policy and signed out [place] and was of sound mind when he did so.  He is high risk for readmission.  Discharge Exam: Vitals:   05/15/19 2105 05/16/19 0545  BP: (!) 144/88 (!) 142/89  Pulse: 94 81  Resp: 18 17  Temp: 98.1 F (36.7 C) 97.9 F (36.6 C)  SpO2: 100% 100%   Vitals:   05/15/19 0519 05/15/19 1421 05/15/19 2105 05/16/19 0545  BP: (!) 142/87 (!) 141/79 (!) 144/88 (!) 142/89  Pulse: 93 91 94 81  Resp: '16 17 18 17  '$ Temp: 98.2 F (36.8 C) 98.6 F (37 C) 98.1 F (36.7 C) 97.9 F (36.6 C)  TempSrc: Oral Oral Oral Oral  SpO2: 100% 100% 100% 100%  Weight:      Height:       General: Pt is alert, awake, not in acute distress Cardiovascular: RRR, S1/S2 +, no rubs, no gallops; has a murmur Respiratory: Diminished bilaterally, no wheezing, no rhonchi Abdominal: Soft, NT, slightly distended, bowel sounds + Extremities: Upper extremities are severely scarred and right is somewhat more swollen than left and has track marks from his prior injections , no cyanosis   The  results of significant diagnostics from this hospitalization (including imaging, microbiology, ancillary and laboratory) are listed below for reference.    Microbiology: Recent Results (from the past 240 hour(s))  Culture, blood (Routine x 2)     Status: Abnormal   Collection Time: 05/08/19 10:00 PM   Specimen: BLOOD RIGHT HAND  Result Value Ref Range Status   Specimen Description BLOOD RIGHT HAND  Final   Special Requests   Final    BOTTLES DRAWN AEROBIC ONLY Blood Culture adequate volume   Culture  Setup Time   Final    AEROBIC BOTTLE ONLY GRAM POSITIVE COCCI CRITICAL RESULT CALLED TO, READ BACK BY AND VERIFIED WITH: PHRMD FEAY LORA AT 0175 BY MESSAN [place] ON 05/10/2019 Performed at [facility], [address], [place], [place] [address]    Culture STREPTOCOCCUS MITIS/ORALIS (A)  Final   Report Status 05/11/2019 FINAL  Final   Organism ID, Bacteria STREPTOCOCCUS MITIS/ORALIS  Final      Susceptibility   Streptococcus mitis/oralis - MIC*    TETRACYCLINE 0.5 SENSITIVE Sensitive     VANCOMYCIN 0.5 SENSITIVE Sensitive     CLINDAMYCIN <=0.25 SENSITIVE Sensitive     PENICILLIN Value in next row Sensitive      SENSITIVE<=0.06    CEFTRIAXONE Value in next row Sensitive      SENSITIVE<=0.12    * STREPTOCOCCUS MITIS/ORALIS  Blood Culture ID Panel (Reflexed)     Status: Abnormal   Collection Time: 05/08/19 10:00 PM  Result Value Ref Range Status   Enterococcus species NOT DETECTED NOT DETECTED Final   Listeria monocytogenes NOT DETECTED NOT DETECTED Final   Staphylococcus species NOT DETECTED NOT DETECTED Final   Staphylococcus aureus (BCID) NOT DETECTED NOT DETECTED Final   Streptococcus species DETECTED (A) NOT DETECTED Final    Comment: Not Enterococcus species, Streptococcus agalactiae, Streptococcus pyogenes, or Streptococcus pneumoniae. CRITICAL RESULT CALLED TO, READ BACK BY AND VERIFIED WITH: PHRMD FEAY LORA AT 5277 BY MESSAN HOUEGNIFIO ON 05/10/2019     Streptococcus agalactiae NOT DETECTED NOT DETECTED Final   Streptococcus pneumoniae NOT DETECTED NOT DETECTED Final   Streptococcus pyogenes NOT DETECTED NOT DETECTED Final  Acinetobacter baumannii NOT DETECTED NOT DETECTED Final   Enterobacteriaceae species NOT DETECTED NOT DETECTED Final   Enterobacter cloacae complex NOT DETECTED NOT DETECTED Final   Escherichia coli NOT DETECTED NOT DETECTED Final   Klebsiella oxytoca NOT DETECTED NOT DETECTED Final   Klebsiella pneumoniae NOT DETECTED NOT DETECTED Final   Proteus species NOT DETECTED NOT DETECTED Final   Serratia marcescens NOT DETECTED NOT DETECTED Final   Haemophilus influenzae NOT DETECTED NOT DETECTED Final   Neisseria meningitidis NOT DETECTED NOT DETECTED Final   Pseudomonas aeruginosa NOT DETECTED NOT DETECTED Final   Candida albicans NOT DETECTED NOT DETECTED Final   Candida glabrata NOT DETECTED NOT DETECTED Final   Candida krusei NOT DETECTED NOT DETECTED Final   Candida parapsilosis NOT DETECTED NOT DETECTED Final   Candida tropicalis NOT DETECTED NOT DETECTED Final    Comment: Performed at [facility], [name] [address] [place], [place] [address]  Culture, blood (Routine x 2)     Status: Abnormal   Collection Time: 05/08/19 10:12 PM   Specimen: BLOOD RIGHT ARM  Result Value Ref Range Status   Specimen Description BLOOD RIGHT ARM  Final   Special Requests   Final    BOTTLES DRAWN AEROBIC AND ANAEROBIC Blood Culture adequate volume   Culture  Setup Time   Final    GRAM POSITIVE COCCI IN BOTH AEROBIC AND ANAEROBIC BOTTLES CRITICAL RESULT CALLED TO, READ BACK BY AND VERIFIED WITH: PHARMD JESSICA BARNEY '@[id number]'$  05/09/19 AKT    Culture (A)  Final    STREPTOCOCCUS MITIS/ORALIS SUSCEPTIBILITIES PERFORMED ON PREVIOUS CULTURE WITHIN THE LAST 5 DAYS. Performed at [facility], [place] [address], [place], [place] [address]    Report Status 05/11/2019 FINAL  Final  Urine culture     Status: None   Collection Time:  05/09/19 12:03 AM   Specimen: In/Out Cath Urine  Result Value Ref Range Status   Specimen Description IN/OUT CATH URINE  Final   Special Requests NONE  Final   Culture   Final    NO GROWTH Performed at [facility], [place] [address], [place], [place] [address]    Report Status 05/10/2019 FINAL  Final  SARS CORONAVIRUS 2 (TAT 6-24 HRS) Nasopharyngeal Nasopharyngeal Swab     Status: None   Collection Time: 05/09/19  1:24 AM   Specimen: Nasopharyngeal Swab  Result Value Ref Range Status   SARS Coronavirus 2 NEGATIVE NEGATIVE Final    Comment: (NOTE) SARS-CoV-2 target nucleic acids are NOT DETECTED. The SARS-CoV-2 RNA is generally detectable in upper and lower respiratory specimens during the acute phase of infection. Negative results do not preclude SARS-CoV-2 infection, do not rule out co-infections with other pathogens, and should not be used as the sole basis for treatment or other patient management decisions. Negative results must be combined with clinical observations, patient history, and epidemiological information. The expected result is Negative. Fact Sheet for Patients: SugarRoll.be Fact Sheet for Healthcare Providers: https://www.woods-mathews.com/ This test is not yet approved or cleared by the [place] FDA and  has been authorized for detection and/or diagnosis of SARS-CoV-2 by FDA under an Emergency Use Authorization (EUA). This EUA will remain  in effect (meaning this test can be used) for the duration of the COVID-19 declaration under Section 56 4(b)(1) of the Act, 21 U.S.C. section 360bbb-3(b)(1), unless the authorization is terminated or revoked sooner. Performed at [facility], [place] [address] [place], [place] [address]   Culture, blood (Routine  X 2) w Reflex to ID Panel     Status: None (Preliminary result)   Collection Time: 05/10/19  3:22 PM   Specimen: BLOOD  Result Value Ref Range Status    Specimen Description BLOOD LEFT ARM  Final   Special Requests   Final    BOTTLES DRAWN AEROBIC ONLY Blood Culture adequate volume Performed at [facility], [place] [address], [place], [place] [address]    Culture NO GROWTH 4 DAYS  Final   Report Status PENDING  Incomplete  Culture, blood (Routine X 2) w Reflex to ID Panel     Status: None (Preliminary result)   Collection Time: 05/10/19  3:29 PM   Specimen: BLOOD  Result Value Ref Range Status   Specimen Description BLOOD LEFT ARM  Final   Special Requests   Final    BOTTLES DRAWN AEROBIC ONLY Blood Culture adequate volume Performed at [facility], [place] [address], [place], [place] [address]    Culture NO GROWTH 4 DAYS  Final   Report Status PENDING  Incomplete  MRSA PCR Screening     Status: None   Collection Time: 05/11/19  5:25 PM   Specimen: Nasal Mucosa; Nasopharyngeal  Result Value Ref Range Status   MRSA by PCR NEGATIVE NEGATIVE Final    Comment:        The GeneXpert MRSA Assay (FDA approved for NASAL specimens only), is one component of a comprehensive MRSA colonization surveillance program. It is not intended to diagnose MRSA infection nor to guide or monitor treatment for MRSA infections. Performed at [facility], [place] [address], [place], [place] [address]     Labs: BNP (last 3 results) No results for input(s): BNP in the last 8760 hours. Basic Metabolic Panel: Recent Labs  Lab 05/12/19 0130 05/13/19 0430 05/14/19 0435 05/15/19 0417 05/16/19 0434  NA 135 138 136 133* 131*  K 4.1 3.8 4.0 3.7 4.4  CL 102 103 100 98 98  CO2 '22 26 27 25 25  '$ GLUCOSE 141* 85 256* 324* 469*  BUN '17 16 16 16 18  '$ CREATININE 1.19 1.25* 1.31* 1.29* 1.62*  CALCIUM 8.7* 8.8* 8.9 8.7* 8.7*  MG  --   --  1.8 1.7 1.8  PHOS  --   --  4.2 4.7* 4.5   Liver Function Tests: Recent Labs  Lab 05/14/19 0435 05/15/19 0417 05/16/19 0434  AST '21 27 23  '$ ALT '23 29 28  '$ ALKPHOS 132* 124 117  BILITOT 0.4 0.1* 0.2*  PROT  6.6 6.0* 6.0*  ALBUMIN 2.6* 2.4* 2.4*   No results for input(s): LIPASE, AMYLASE in the last 168 hours. No results for input(s): AMMONIA in the last 168 hours. CBC: Recent Labs  Lab 05/12/19 0130 05/13/19 0430 05/14/19 0435 05/15/19 0417 05/16/19 0434  WBC 16.5* 16.5* 14.9* 15.4* 14.4*  NEUTROABS  --   --  8.5* 9.4* 9.1*  HGB 10.0* 9.9* 9.6* 9.2* 9.0*  HCT 31.3* 32.0* 30.5* 28.8* 28.5*  MCV 73.1* 75.5* 74.2* 74.4* 74.8*  PLT 534* 592* 552* 587* 549*   Cardiac Enzymes: No results for input(s): CKTOTAL, CKMB, CKMBINDEX, TROPONINI in the last 168 hours. BNP: Invalid input(s): POCBNP CBG: Recent Labs  Lab 05/15/19 1157 05/15/19 1630 05/15/19 2107 05/16/19 0734 05/16/19 1206  GLUCAP 404* 73 455* 394* 328*   D-Dimer No results for input(s): DDIMER in the last 72 hours. Hgb A1c No results for input(s): HGBA1C in the last 72 hours. Lipid Profile No results  for input(s): CHOL, HDL, LDLCALC, TRIG, CHOLHDL, LDLDIRECT in the last 72 hours. Thyroid function studies No results for input(s): TSH, T4TOTAL, T3FREE, THYROIDAB in the last 72 hours.  Invalid input(s): FREET3 Anemia work up No results for input(s): VITAMINB12, FOLATE, FERRITIN, TIBC, IRON, RETICCTPCT in the last 72 hours. Urinalysis    Component Value Date/Time   COLORURINE AMBER (A) 05/08/2019 2350   APPEARANCEUR CLOUDY (A) 05/08/2019 2350   APPEARANCEUR Clear 04/11/2014 0138   LABSPEC 1.030 05/08/2019 2350   LABSPEC 1.026 04/11/2014 0138   PHURINE 5.0 05/08/2019 2350   GLUCOSEU 50 (A) 05/08/2019 2350   GLUCOSEU >=500 04/11/2014 0138   HGBUR NEGATIVE 05/08/2019 2350   BILIRUBINUR NEGATIVE 05/08/2019 2350   BILIRUBINUR negative 03/24/2018 1032   BILIRUBINUR neg 02/18/2017 0946   BILIRUBINUR Negative 04/11/2014 0138   KETONESUR 5 (A) 05/08/2019 2350   PROTEINUR >=300 (A) 05/08/2019 2350   UROBILINOGEN 0.2 03/24/2018 1032   UROBILINOGEN 0.2 12/10/2013 1221   NITRITE NEGATIVE 05/08/2019 2350   LEUKOCYTESUR  NEGATIVE 05/08/2019 2350   LEUKOCYTESUR Negative 04/11/2014 0138   Sepsis Labs Invalid input(s): PROCALCITONIN,  WBC,  LACTICIDVEN Microbiology Recent Results (from the past 240 hour(s))  Culture, blood (Routine x 2)     Status: Abnormal   Collection Time: 05/08/19 10:00 PM   Specimen: BLOOD RIGHT HAND  Result Value Ref Range Status   Specimen Description BLOOD RIGHT HAND  Final   Special Requests   Final    BOTTLES DRAWN AEROBIC ONLY Blood Culture adequate volume   Culture  Setup Time   Final    AEROBIC BOTTLE ONLY GRAM POSITIVE COCCI CRITICAL RESULT CALLED TO, READ BACK BY AND VERIFIED WITH: PHRMD FEAY LORA AT 4782 BY MESSAN [place] ON 05/10/2019 Performed at [facility], [place] [address], [place], [place] [address]    Culture STREPTOCOCCUS MITIS/ORALIS (A)  Final   Report Status 05/11/2019 FINAL  Final   Organism ID, Bacteria STREPTOCOCCUS MITIS/ORALIS  Final      Susceptibility   Streptococcus mitis/oralis - MIC*    TETRACYCLINE 0.5 SENSITIVE Sensitive     VANCOMYCIN 0.5 SENSITIVE Sensitive     CLINDAMYCIN <=0.25 SENSITIVE Sensitive     PENICILLIN Value in next row Sensitive      SENSITIVE<=0.06    CEFTRIAXONE Value in next row Sensitive      SENSITIVE<=0.12    * STREPTOCOCCUS MITIS/ORALIS  Blood Culture ID Panel (Reflexed)     Status: Abnormal   Collection Time: 05/08/19 10:00 PM  Result Value Ref Range Status   Enterococcus species NOT DETECTED NOT DETECTED Final   Listeria monocytogenes NOT DETECTED NOT DETECTED Final   Staphylococcus species NOT DETECTED NOT DETECTED Final   Staphylococcus aureus (BCID) NOT DETECTED NOT DETECTED Final   Streptococcus species DETECTED (A) NOT DETECTED Final    Comment: Not Enterococcus species, Streptococcus agalactiae, Streptococcus pyogenes, or Streptococcus pneumoniae. CRITICAL RESULT CALLED TO, READ BACK BY AND VERIFIED WITH: PHRMD FEAY LORA AT 3086 BY MESSAN HOUEGNIFIO ON 05/10/2019    Streptococcus agalactiae NOT  DETECTED NOT DETECTED Final   Streptococcus pneumoniae NOT DETECTED NOT DETECTED Final   Streptococcus pyogenes NOT DETECTED NOT DETECTED Final   Acinetobacter baumannii NOT DETECTED NOT DETECTED Final   Enterobacteriaceae species NOT DETECTED NOT DETECTED Final   Enterobacter cloacae complex NOT DETECTED NOT DETECTED Final   Escherichia coli NOT DETECTED NOT DETECTED Final   Klebsiella oxytoca NOT DETECTED NOT DETECTED Final   Klebsiella pneumoniae NOT DETECTED NOT DETECTED Final  Proteus species NOT DETECTED NOT DETECTED Final   Serratia marcescens NOT DETECTED NOT DETECTED Final   Haemophilus influenzae NOT DETECTED NOT DETECTED Final   Neisseria meningitidis NOT DETECTED NOT DETECTED Final   Pseudomonas aeruginosa NOT DETECTED NOT DETECTED Final   Candida albicans NOT DETECTED NOT DETECTED Final   Candida glabrata NOT DETECTED NOT DETECTED Final   Candida krusei NOT DETECTED NOT DETECTED Final   Candida parapsilosis NOT DETECTED NOT DETECTED Final   Candida tropicalis NOT DETECTED NOT DETECTED Final    Comment: Performed at [facility], [place] [address], [place], [place] [address]  Culture, blood (Routine x 2)     Status: Abnormal   Collection Time: 05/08/19 10:12 PM   Specimen: BLOOD RIGHT ARM  Result Value Ref Range Status   Specimen Description BLOOD RIGHT ARM  Final   Special Requests   Final    BOTTLES DRAWN AEROBIC AND ANAEROBIC Blood Culture adequate volume   Culture  Setup Time   Final    GRAM POSITIVE COCCI IN BOTH AEROBIC AND ANAEROBIC BOTTLES CRITICAL RESULT CALLED TO, READ BACK BY AND VERIFIED WITH: PHARMD JESSICA BARNEY '@[id number]'$  05/09/19 AKT    Culture (A)  Final    STREPTOCOCCUS MITIS/ORALIS SUSCEPTIBILITIES PERFORMED ON PREVIOUS CULTURE WITHIN THE LAST 5 DAYS. Performed at [facility], [place] [address] [place], [place] [address]    Report Status 05/11/2019 FINAL  Final  Urine culture     Status: None   Collection Time: 05/09/19 12:03 AM   Specimen:  In/Out Cath Urine  Result Value Ref Range Status   Specimen Description IN/OUT CATH URINE  Final   Special Requests NONE  Final   Culture   Final    NO GROWTH Performed at [facility], [place] [address] [place], [place] [address]    Report Status 05/10/2019 FINAL  Final  SARS CORONAVIRUS 2 (TAT 6-24 HRS) Nasopharyngeal Nasopharyngeal Swab     Status: None   Collection Time: 05/09/19  1:24 AM   Specimen: Nasopharyngeal Swab  Result Value Ref Range Status   SARS Coronavirus 2 NEGATIVE NEGATIVE Final    Comment: (NOTE) SARS-CoV-2 target nucleic acids are NOT DETECTED. The SARS-CoV-2 RNA is generally detectable in upper and lower respiratory specimens during the acute phase of infection. Negative results do not preclude SARS-CoV-2 infection, do not rule out co-infections with other pathogens, and should not be used as the sole basis for treatment or other patient management decisions. Negative results must be combined with clinical observations, patient history, and epidemiological information. The expected result is Negative. Fact Sheet for Patients: SugarRoll.be Fact Sheet for Healthcare Providers: https://www.woods-mathews.com/ This test is not yet approved or cleared by the [place] FDA and  has been authorized for detection and/or diagnosis of SARS-CoV-2 by FDA under an Emergency Use Authorization (EUA). This EUA will remain  in effect (meaning this test can be used) for the duration of the COVID-19 declaration under Section 56 4(b)(1) of the Act, 21 U.S.C. section 360bbb-3(b)(1), unless the authorization is terminated or revoked sooner. Performed at [facility], [place] [address], [place], [place] [address]   Culture, blood (Routine X 2) w Reflex to ID Panel     Status: None (Preliminary result)   Collection Time: 05/10/19  3:22 PM   Specimen: BLOOD  Result Value Ref Range Status   Specimen Description BLOOD LEFT ARM   Final   Special Requests   Final    BOTTLES DRAWN  AEROBIC ONLY Blood Culture adequate volume Performed at [place] [address], [place], [place] [address]    Culture NO GROWTH 4 DAYS  Final   Report Status PENDING  Incomplete  Culture, blood (Routine X 2) w Reflex to ID Panel     Status: None (Preliminary result)   Collection Time: 05/10/19  3:29 PM   Specimen: BLOOD  Result Value Ref Range Status   Specimen Description BLOOD LEFT ARM  Final   Special Requests   Final    BOTTLES DRAWN AEROBIC ONLY Blood Culture adequate volume Performed at [facility], [place] [address] [place], [place] [address]    Culture NO GROWTH 4 DAYS  Final   Report Status PENDING  Incomplete  MRSA PCR Screening     Status: None   Collection Time: 05/11/19  5:25 PM   Specimen: Nasal Mucosa; Nasopharyngeal  Result Value Ref Range Status   MRSA by PCR NEGATIVE NEGATIVE Final    Comment:        The GeneXpert MRSA Assay (FDA approved for NASAL specimens only), is one component of a comprehensive MRSA colonization surveillance program. It is not intended to diagnose MRSA infection nor to guide or monitor treatment for MRSA infections. Performed at [facility], [place] [address], [place], [place] [address]    Time coordinating discharge: 35 minutes  SIGNED:  Kerney Elbe, DO [facility] 05/16/2019, 2:48 PM Pager is on [place]  If 7PM-7AM, please contact night-coverage [email or web address] Password [email or web address]

## 2019-05-16 NOTE — Progress Notes (Signed)
Patient ate 100% of his breakfast at 0930 today.

## 2019-05-16 NOTE — Progress Notes (Signed)
RN placed IV team consult to remove PICC line due to patient leaving AMA. Patient was compliant with removal of PICC line at this time

## 2019-05-16 NOTE — Progress Notes (Addendum)
Secure chat with Dr Alfredia Ferguson re PICC order.  Due to BUE infections, recommend a midline or central access.  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  Will discontinue PICC order as discussed and leave current PICC in place. Dr Erlinda Hong was added to the securechat conversation.  Remo Lipps RN also notified of conversation.

## 2019-05-16 NOTE — Progress Notes (Signed)
Due to patient eating full breakfast today, will postpone surgery until tomorrow morning.  He is stable, not septic.  Patient and nurse updated.  Will have RUE PICC line switched over to the LUE in preparation for surgery.  NPO after midnight.  Azucena Cecil, MD [facility] [phone] 12:09 PM

## 2019-05-17 SURGERY — IRRIGATION AND DEBRIDEMENT EXTREMITY
Anesthesia: General | Laterality: Right

## 2019-05-18 ENCOUNTER — Telehealth: Payer: Self-pay

## 2019-05-18 NOTE — Telephone Encounter (Signed)
Transition Care Management Follow-up Telephone Call Date of discharge and from where: 05/16/2019, [facility]  - left AMA  Call placed to the patient and he said that he is on his way back to the hospital.

## 2019-05-19 ENCOUNTER — Emergency Department (HOSPITAL_COMMUNITY): Payer: Commercial Managed Care - PPO

## 2019-05-19 ENCOUNTER — Inpatient Hospital Stay (HOSPITAL_COMMUNITY)
Admission: EM | Admit: 2019-05-19 | Discharge: 2019-05-29 | DRG: 853 | Disposition: A | Discharge status: Home / Self Care | Payer: Commercial Managed Care - PPO | Attending: Family Medicine | Admitting: Family Medicine

## 2019-05-19 ENCOUNTER — Encounter (HOSPITAL_COMMUNITY): Payer: Self-pay | Admitting: Emergency Medicine

## 2019-05-19 ENCOUNTER — Other Ambulatory Visit: Payer: Self-pay

## 2019-05-19 DIAGNOSIS — IMO0002 Reserved for concepts with insufficient information to code with codable children: Secondary | ICD-10-CM

## 2019-05-19 DIAGNOSIS — Z89412 Acquired absence of left great toe: Secondary | ICD-10-CM

## 2019-05-19 DIAGNOSIS — T829XXA Unspecified complication of cardiac and vascular prosthetic device, implant and graft, initial encounter: Secondary | ICD-10-CM

## 2019-05-19 DIAGNOSIS — N179 Acute kidney failure, unspecified: Secondary | ICD-10-CM | POA: Diagnosis not present

## 2019-05-19 DIAGNOSIS — N1831 Chronic kidney disease, stage 3a: Secondary | ICD-10-CM | POA: Diagnosis present

## 2019-05-19 DIAGNOSIS — I129 Hypertensive chronic kidney disease with stage 1 through stage 4 chronic kidney disease, or unspecified chronic kidney disease: Secondary | ICD-10-CM | POA: Diagnosis present

## 2019-05-19 DIAGNOSIS — D631 Anemia in chronic kidney disease: Secondary | ICD-10-CM | POA: Diagnosis present

## 2019-05-19 DIAGNOSIS — F329 Major depressive disorder, single episode, unspecified: Secondary | ICD-10-CM | POA: Diagnosis present

## 2019-05-19 DIAGNOSIS — E43 Unspecified severe protein-calorie malnutrition: Secondary | ICD-10-CM | POA: Diagnosis present

## 2019-05-19 DIAGNOSIS — A419 Sepsis, unspecified organism: Secondary | ICD-10-CM | POA: Diagnosis present

## 2019-05-19 DIAGNOSIS — E1065 Type 1 diabetes mellitus with hyperglycemia: Secondary | ICD-10-CM | POA: Diagnosis present

## 2019-05-19 DIAGNOSIS — I330 Acute and subacute infective endocarditis: Secondary | ICD-10-CM | POA: Diagnosis present

## 2019-05-19 DIAGNOSIS — F1910 Other psychoactive substance abuse, uncomplicated: Secondary | ICD-10-CM | POA: Diagnosis present

## 2019-05-19 DIAGNOSIS — A408 Other streptococcal sepsis: Secondary | ICD-10-CM | POA: Diagnosis not present

## 2019-05-19 DIAGNOSIS — I38 Endocarditis, valve unspecified: Secondary | ICD-10-CM | POA: Diagnosis present

## 2019-05-19 DIAGNOSIS — F1990 Other psychoactive substance use, unspecified, uncomplicated: Secondary | ICD-10-CM | POA: Diagnosis present

## 2019-05-19 DIAGNOSIS — Z818 Family history of other mental and behavioral disorders: Secondary | ICD-10-CM

## 2019-05-19 DIAGNOSIS — B1920 Unspecified viral hepatitis C without hepatic coma: Secondary | ICD-10-CM | POA: Diagnosis present

## 2019-05-19 DIAGNOSIS — N183 Chronic kidney disease, stage 3 unspecified: Secondary | ICD-10-CM | POA: Diagnosis present

## 2019-05-19 DIAGNOSIS — F419 Anxiety disorder, unspecified: Secondary | ICD-10-CM | POA: Diagnosis present

## 2019-05-19 DIAGNOSIS — R7989 Other specified abnormal findings of blood chemistry: Secondary | ICD-10-CM

## 2019-05-19 DIAGNOSIS — F1110 Opioid abuse, uncomplicated: Secondary | ICD-10-CM | POA: Diagnosis present

## 2019-05-19 DIAGNOSIS — Z8614 Personal history of Methicillin resistant Staphylococcus aureus infection: Secondary | ICD-10-CM

## 2019-05-19 DIAGNOSIS — E1042 Type 1 diabetes mellitus with diabetic polyneuropathy: Secondary | ICD-10-CM | POA: Diagnosis present

## 2019-05-19 DIAGNOSIS — E039 Hypothyroidism, unspecified: Secondary | ICD-10-CM | POA: Diagnosis present

## 2019-05-19 DIAGNOSIS — I058 Other rheumatic mitral valve diseases: Secondary | ICD-10-CM | POA: Diagnosis present

## 2019-05-19 DIAGNOSIS — Z8249 Family history of ischemic heart disease and other diseases of the circulatory system: Secondary | ICD-10-CM

## 2019-05-19 DIAGNOSIS — E785 Hyperlipidemia, unspecified: Secondary | ICD-10-CM | POA: Diagnosis present

## 2019-05-19 DIAGNOSIS — L02413 Cutaneous abscess of right upper limb: Secondary | ICD-10-CM

## 2019-05-19 DIAGNOSIS — D62 Acute posthemorrhagic anemia: Secondary | ICD-10-CM | POA: Diagnosis not present

## 2019-05-19 DIAGNOSIS — Z9111 Patient's noncompliance with dietary regimen: Secondary | ICD-10-CM

## 2019-05-19 DIAGNOSIS — I10 Essential (primary) hypertension: Secondary | ICD-10-CM | POA: Diagnosis present

## 2019-05-19 DIAGNOSIS — E871 Hypo-osmolality and hyponatremia: Secondary | ICD-10-CM | POA: Diagnosis not present

## 2019-05-19 DIAGNOSIS — Z9119 Patient's noncompliance with other medical treatment and regimen: Secondary | ICD-10-CM

## 2019-05-19 DIAGNOSIS — M546 Pain in thoracic spine: Secondary | ICD-10-CM | POA: Diagnosis not present

## 2019-05-19 DIAGNOSIS — L0291 Cutaneous abscess, unspecified: Secondary | ICD-10-CM | POA: Diagnosis present

## 2019-05-19 DIAGNOSIS — Z794 Long term (current) use of insulin: Secondary | ICD-10-CM

## 2019-05-19 DIAGNOSIS — E109 Type 1 diabetes mellitus without complications: Secondary | ICD-10-CM | POA: Diagnosis present

## 2019-05-19 DIAGNOSIS — L03119 Cellulitis of unspecified part of limb: Secondary | ICD-10-CM | POA: Diagnosis present

## 2019-05-19 DIAGNOSIS — F17210 Nicotine dependence, cigarettes, uncomplicated: Secondary | ICD-10-CM | POA: Diagnosis present

## 2019-05-19 DIAGNOSIS — K219 Gastro-esophageal reflux disease without esophagitis: Secondary | ICD-10-CM | POA: Diagnosis present

## 2019-05-19 DIAGNOSIS — Z833 Family history of diabetes mellitus: Secondary | ICD-10-CM

## 2019-05-19 DIAGNOSIS — Z720 Tobacco use: Secondary | ICD-10-CM | POA: Diagnosis present

## 2019-05-19 DIAGNOSIS — F1410 Cocaine abuse, uncomplicated: Secondary | ICD-10-CM | POA: Diagnosis present

## 2019-05-19 DIAGNOSIS — B182 Chronic viral hepatitis C: Secondary | ICD-10-CM | POA: Diagnosis present

## 2019-05-19 DIAGNOSIS — L03113 Cellulitis of right upper limb: Secondary | ICD-10-CM | POA: Diagnosis present

## 2019-05-19 DIAGNOSIS — I059 Rheumatic mitral valve disease, unspecified: Secondary | ICD-10-CM | POA: Diagnosis present

## 2019-05-19 DIAGNOSIS — Z79899 Other long term (current) drug therapy: Secondary | ICD-10-CM

## 2019-05-19 DIAGNOSIS — Z6829 Body mass index (BMI) 29.0-29.9, adult: Secondary | ICD-10-CM

## 2019-05-19 DIAGNOSIS — Z79890 Hormone replacement therapy (postmenopausal): Secondary | ICD-10-CM

## 2019-05-19 LAB — CBC
HCT: 31.5 % — ABNORMAL LOW (ref 39.0–52.0)
Hemoglobin: 9.9 g/dL — ABNORMAL LOW (ref 13.0–17.0)
MCH: 23.7 pg — ABNORMAL LOW (ref 26.0–34.0)
MCHC: 31.4 g/dL (ref 30.0–36.0)
MCV: 75.5 fL — ABNORMAL LOW (ref 80.0–100.0)
Platelets: 724 10*3/uL — ABNORMAL HIGH (ref 150–400)
RBC: 4.17 MIL/uL — ABNORMAL LOW (ref 4.22–5.81)
RDW: 14.9 % (ref 11.5–15.5)
WBC: 22.1 10*3/uL — ABNORMAL HIGH (ref 4.0–10.5)
nRBC: 0 % (ref 0.0–0.2)

## 2019-05-19 LAB — PROTIME-INR
INR: 0.9 (ref 0.8–1.2)
Prothrombin Time: 12.4 seconds (ref 11.4–15.2)

## 2019-05-19 LAB — CULTURE, BLOOD (ROUTINE X 2)
Culture: NO GROWTH
Culture: NO GROWTH
Special Requests: ADEQUATE
Special Requests: ADEQUATE

## 2019-05-19 LAB — BASIC METABOLIC PANEL
Anion gap: 12 (ref 5–15)
BUN: 17 mg/dL (ref 6–20)
CO2: 22 mmol/L (ref 22–32)
Calcium: 9.2 mg/dL (ref 8.9–10.3)
Chloride: 106 mmol/L (ref 98–111)
Creatinine, Ser: 1.49 mg/dL — ABNORMAL HIGH (ref 0.61–1.24)
GFR calc Af Amer: >60 mL/min (ref >60)
GFR calc non Af Amer: 58 mL/min — ABNORMAL LOW (ref >60)
Glucose, Bld: 93 mg/dL (ref 70–99)
Potassium: 3.4 mmol/L — ABNORMAL LOW (ref 3.5–5.1)
Sodium: 140 mmol/L (ref 135–145)

## 2019-05-19 LAB — TROPONIN I (HIGH SENSITIVITY): Troponin I (High Sensitivity): 20 ng/L — ABNORMAL HIGH (ref <18)

## 2019-05-19 MED ORDER — SODIUM CHLORIDE 0.9% FLUSH
3.0000 mL | Freq: Once | INTRAVENOUS | Status: AC
  Administered 2019-05-20: 3 mL via INTRAVENOUS

## 2019-05-19 MED ORDER — PENICILLIN G POTASSIUM 20000000 UNITS IJ SOLR: 12.0000 10*6.[IU] | INTRAVENOUS | Status: DC

## 2019-05-19 NOTE — H&P (Signed)
[facility] History and Physical  Luke [name] SKA:[id number] DOB: 12/06/1980 DOA: 05/19/2019  Referring ED provider: Laverta Baltimore, MD PCP: Gildardo Pounds, NP   Chief Complaint: Endocarditis treatment completion  HPI: Luke [name] is a 39 y.o. 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  Patient recently admitted on 2/20 for sepsis secondary to endocarditis and ultimately left AMA on 2/27 prior to completion of IV abx treatment. He reports feeling scared and that is why he left but after talking with family, they convinced him to come back and complete treatment. Denies any complaints currently. He last used heroin yesterday. Reports he did not take his blood pressure or DM medications today but has otherwise been compliant. Denies any pain. Denies problems eating or drinking. Denies fever, chills, cough, SOB, chest pain, abdominal pain, nausea, vomiting, diarrhea, constipation, dysuria, hematuria, hematochezia, melena, difficulty moving arms/legs, speech difficulty, trouble eating, confusion or any other complaints.  In the ED: Tachycardic with elevated blood pressure otherwise afebrile and stable. Labs: K 3.4, glucose 93, Cr 1.49, WBC 22.1, Hgb 9.9, Trop 20>25, ESR 62. ED provider called for admission for continuation of prior IV abx treatment.   Review of Systems:  All other systems negative unless noted above in HPI.   Past Medical History:  Diagnosis Date  . Anemia   . Anxiety  Dx 2008  . Chronic kidney disease   . Diabetes mellitus without complication ([place]) Dx 2035  . DKA (diabetic ketoacidoses) ([place]) 07/21/2017  . GERD (gastroesophageal reflux disease) Dx 2008  . Headache(784.0)   . Hypertension   . Pneumonia   . Seizures ([place]) 2011    x 2 in lifetime. on Dilantin for a while.   . Substance abuse ([place]) 2013      heroin use, multiple relapses   Past Surgical History:  Procedure Laterality Date  . AMPUTATION Left 09/05/2018   Procedure: LEFT GREAT TOE AMPUTATION;  Surgeon: [name], MD;  Location: [place];  Service: Orthopedics;  Laterality: Left;  . I & D EXTREMITY Left 10/11/2012   Procedure: IRRIGATION AND DEBRIDEMENT ABSCESS FOREARM;  Surgeon: Linna Hoff, MD;  Location: [place];  Service: Orthopedics;  Laterality: Left;  . I & D EXTREMITY Left 10/12/2012   Procedure: IRRIGATION AND DEBRIDEMENT FOREARM;  Surgeon: Linna Hoff, MD;  Location: [place];  Service: Orthopedics;  Laterality: Left;  . I & D EXTREMITY Left 10/14/2012   Procedure: incision and drainage left forearm;  Surgeon: Linna Hoff, MD;  Location: [place];  Service: Orthopedics;  Laterality: Left;  . I & D EXTREMITY Left 10/16/2012   Procedure: IRRIGATION AND DEBRIDEMENT LEFT FOREARM;  Surgeon: Linna Hoff, MD;  Location: [facility];  Service: Orthopedics;  Laterality: Left;  . I & D EXTREMITY Left 10/20/2012   Procedure: INCISION AND DRAINAGE AND DEBRIDEMENT LEFT  FOREARM;  Surgeon: Linna Hoff, MD;  Location: [facility];  Service: Orthopedics;  Laterality: Left;  . INCISION AND DRAINAGE OF WOUND Bilateral 11/29/2017   Procedure: DEBRIDEMENT BILATERAL FEET, DEBRIDEMENT LEFT ANKLE, AND APPLY WOUND VAC;  Surgeon: [name], MD;  Location: [place];  Service: Orthopedics;  Laterality: Bilateral;  . IRRIGATION AND DEBRIDEMENT ABSCESS     Hx: of left arm abscess related to drug use   . TEE WITHOUT CARDIOVERSION N/A 11/28/2017   Procedure: TRANSESOPHAGEAL ECHOCARDIOGRAM (  TEE);  Surgeon: Croitoru, Mihai, MD;  Location: [facility];  Service: Cardiovascular;  Laterality: N/A;  . TEE WITHOUT CARDIOVERSION N/A 05/12/2019   Procedure: TRANSESOPHAGEAL ECHOCARDIOGRAM (TEE);  Surgeon: [place], Tiffany, MD;  Location: [facility];  Service: Cardiovascular;  Laterality: N/A;   Social History:  reports that he has been smoking cigarettes. He has  been smoking about 1.00 pack per day. He has never used smokeless tobacco. He reports previous drug use. Drugs: Cocaine, IV, and Heroin. He reports that he does not drink alcohol.  No Known Allergies  Family History  Problem Relation Age of Onset  . Diabetes Father   . Heart disease Father   . Mental illness Sister   . Cancer Neg Hx     Prior to Admission medications   Medication Sig Start Date End Date Taking? Authorizing Provider  acetaminophen (TYLENOL) 325 MG tablet Take 2 tablets (650 mg total) by mouth every 6 (six) hours as needed for mild pain (or Fever >/= 101). 12/19/16   Rizwan, Saima, MD  amLODipine (NORVASC) 5 MG tablet Take 1 tablet (5 mg total) by mouth daily. 04/20/19   Fleming, Zelda W, NP  atorvastatin (LIPITOR) 20 MG tablet Take 1 tablet (20 mg total) by mouth daily. 04/20/19   Fleming, Zelda W, NP  Blood Glucose Monitoring Suppl (TRUE METRIX METER) DEVI 1 kit by Does not apply route as directed. Use as directed 06/07/15   Funches, Josalyn, MD  DULoxetine (CYMBALTA) 30 MG capsule TAKE 1 CAPSULE (30 MG TOTAL) BY MOUTH DAILY. 04/03/19 05/08/28  Newlin, Enobong, MD  gabapentin (NEURONTIN) 400 MG capsule Take 1 capsule (400 mg total) by mouth 3 (three) times daily. 04/20/19 05/20/19  Fleming, Zelda W, NP  glucose blood test strip Use as instructed 04/20/19   Fleming, Zelda W, NP  Insulin Glargine (LANTUS SOLOSTAR) 100 UNIT/ML Solostar Pen INJECT 40 UNITS INTO THE SKIN 2 (TWO) TIMES DAILY. Patient taking differently: Inject 40 Units into the skin 2 (two) times daily.  04/20/19   Fleming, Zelda W, NP  insulin lispro (HUMALOG KWIKPEN) 100 UNIT/ML KwikPen Inject 0.04-0.2 mLs (4-20 Units total) into the skin 3 (three) times daily. Per sliding scale 04/20/19   Fleming, Zelda W, NP  Insulin Syringe-Needle U-100 (TRUEPLUS INSULIN SYRINGE) 31G X 5/16" 0.5 ML MISC USE AS DIRECTED. 04/20/19   Fleming, Zelda W, NP  levothyroxine (SYNTHROID) 50 MCG tablet TAKE 1 TABLET (50 MCG TOTAL) BY MOUTH DAILY. 04/03/19    Newlin, Enobong, MD  lisinopril (ZESTRIL) 10 MG tablet Take 1 tablet (10 mg total) by mouth daily. 10/01/18 05/08/28  Fleming, Zelda W, NP  omeprazole (PRILOSEC) 20 MG capsule Take 1 capsule (20 mg total) by mouth daily. 04/20/19 07/19/19  Fleming, Zelda W, NP  sertraline (ZOLOFT) 50 MG tablet TAKE 1 TABLET (50 MG TOTAL) BY MOUTH DAILY. 12/19/18   Newlin, Enobong, MD  TRUEPLUS LANCETS 28G MISC Use as directed 01/28/17   Hairston, Mandesia R, FNP   Physical Exam: Vitals:   05/19/19 2126 05/19/19 2134 05/19/19 2300 05/20/19 0000  BP:  (!) 168/112 (!) 170/98 (!) 158/96  Pulse:  (!) 146 (!) 126 (!) 125  Resp:  16 20 (!) 23  Temp:  98.5 F (36.9 C)    TempSrc:  Oral    SpO2:  100% 99% 99%  Weight: 90 kg     Height: 5' 9" (1.753 m)       Wt Readings from Last 3 Encounters:  05/19/19 90 kg  05/12/19 81.6 kg    03/19/19 77.1 kg    General:  Appears calm and comfortable. AAOx4 Eyes: EOMI, normal lids, irises & conjunctiva ENT: grossly normal hearing, lips & tongue Neck: normal ROM Cardiovascular: Tachycardic with regular rhythm, no m/r/g. No LE edema. Respiratory: CTA bilaterally, no w/r/r. Normal respiratory effort. Abdomen: soft, ntnd Skin: diffuse scarring on bilateral upper extremities; see photo below  Musculoskeletal: grossly normal tone BUE/BLE Psychiatric: grossly normal mood and affect, speech fluent and appropriate Neurologic: grossly non-focal.              Labs on Admission:  Basic Metabolic Panel: Recent Labs  Lab 05/13/19 0430 05/14/19 0435 05/15/19 0417 05/16/19 0434 05/19/19 2138  NA 138 136 133* 131* 140  K 3.8 4.0 3.7 4.4 3.4*  CL 103 100 98 98 106  CO2 _0 GLUCOSE 85 256* 324* 469* 93  BUN _1 CREATININE 1.25* 1.31* 1.29* 1.62* 1.49*  CALCIUM 8.8* 8.9 8.7* 8.7* 9.2  MG  --  1.8 1.7 1.8  --   PHOS  --  4.2 4.7* 4.5  --    Liver Function Tests: Recent Labs  Lab 05/14/19 0435 05/15/19 0417 05/16/19 0434  AST _2 ALT _3 ALKPHOS 132* 124 117  BILITOT 0.4 0.1* 0.2*  PROT 6.6 6.0* 6.0*  ALBUMIN 2.6* 2.4* 2.4*   No results for input(s): LIPASE, AMYLASE in the last 168 hours. No results for input(s): AMMONIA in the last 168 hours. CBC: Recent Labs  Lab 05/13/19 0430 05/14/19 0435 05/15/19 0417 05/16/19 0434 05/19/19 2138  WBC 16.5* 14.9* 15.4* 14.4* 22.1*  NEUTROABS  --  8.5* 9.4* 9.1*  --   HGB 9.9* 9.6* 9.2* 9.0* 9.9*  HCT 32.0* 30.5* 28.8* 28.5* 31.5*  MCV 75.5* 74.2* 74.4* 74.8* 75.5*  PLT 592* 552* 587* 549* 724*   Cardiac Enzymes: No results for input(s): CKTOTAL, CKMB, CKMBINDEX, TROPONINI in the last 168 hours.  BNP (last 3 results) No results for input(s): BNP in the last 8760 hours.  ProBNP (last 3 results) No results for input(s): PROBNP in the last 8760 hours.  CBG: Recent Labs  Lab 05/15/19 1157 05/15/19 1630 05/15/19 2107 05/16/19 0734 05/16/19 1206  GLUCAP 404* 73 455* 394* 328*    Radiological Exams on Admission: DG Chest 2 View  Result Date: 05/19/2019 CLINICAL DATA:  Tachycardia, IVDU EXAM: CHEST - 2 VIEW COMPARISON:  Radiograph 05/13/2019 FINDINGS: Low volumes with streaky opacities favoring atelectasis. No consolidative opacity is seen. No pneumothorax or effusion. The cardiomediastinal contours are unremarkable. No acute osseous or soft tissue abnormality. IMPRESSION: No acute cardiopulmonary abnormality. Electronically Signed   By: Lovena Le M.D.   On: 05/19/2019 21:59    EKG: Independently reviewed. HR 120. QTc 430. Sinus tachycardia. No acute changes from prior. No STEMI.  Assessment/Plan Principal Problem:   Drug abuse, IV ([name]) Active Problems:   Tobacco abuse   Essential hypertension   Abscess   CKD (chronic kidney disease) stage 3, GFR 30-59 ml/min   Cocaine abuse ([name])   Uncontrolled insulin dependent type 1 diabetes mellitus ([name])   Diabetic polyneuropathy associated with type 1 diabetes mellitus ([name])   Hepatitis C   Hypothyroidism    Sepsis ([place])   Heroin abuse ([place])   Acute bacterial endocarditis   Endocarditis  39 y.o. 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, in setting of IVDU; sepsis present on admission  -Tachycardic on admission with WBC 22.1; previously 14.4 meeting criteria for sepsis again -ID consulted during last admission and recommended 2 weeks of Gentamicin and PCN; patient received 1 week and then left AMA; re-consult ID as needed -Last admission: TCTS to evaluate: recommend medical management and conservative therapy with repeat Echo after treatment  -2/23: TEE showed a large cystlike structure on the A2 segment mobile filamentous on the A2 segment and mobile on the P1 region of the mitral valve along with moderate mitral regurg and no mitral stenosis. No evidence of endocarditis of the aortic valve and no LA/LAA thrombus or mass. There is no ASD or PFO noted. -Gentamicin and PCN ordered on admission -F/u Lactate ordered on admission and discontinue orders to follow if < 2.0 -IVF bolus on admission (normal EF 05/09/19)    Elevated Trop -No acute findings on EKG -Trend Trop  -Patient without chest pain or other complaints   Bilateral upper extremitywounds present on admission, likely secondary to IVDU -Orthopedic surgery previously consulted and planning on taking the patient to the OR for an incision and drainage with likely wound VAC; re-consult as needed  -see photo of arms above  DM1  -HA1c in December 10.0; repeat ordered -Glucose 93 on admission; home regimen halved to 20 units Lantus BID with sliding scale; increase as needed  CKD 3 -Cr 1.49 on admission which is within normal range for patient; cont to monitor    Hypothyroidism -Stable -Cont Levothyroxine 50 mcg po Daily   HTN -Continue Amlodipine and Lisinopril   GERD -Cont PPI Pantoprazole 40 mg po Daily   Anxiety  -Cont Sertraline 50 mg po Daily, Duloxetine 400 mg po TID,   HLD -Cont Atorvastatin 20 mg po Daily  Hepatitis C -The genotype of 1A and RNA level of 10,500,000 back in 2019 -Repeat RNA was 5,170,000 with a HCV quantitative log of 6.713 and his hepatitis B surface antigen was negative nonreactive -Follow-up with ID in the clinic if so desired by patient  Code Status: Full DVT Prophylaxis: Lovenox Family Communication: None Disposition Plan: Patient requires inpatient admission. He has several severe chronic co-morbidities and presents with sepsis requiring IV abx for at least 1 week. He is at high risk for complications and may possibly require surgery.   Time spent: 70 minutes   N  [facility] Pager [phone]    

## 2019-05-19 NOTE — ED Provider Notes (Signed)
Emergency Department Provider Note   I have reviewed the triage vital signs and the nursing notes.   HISTORY  Chief Complaint " Infection in my heart"   HPI Luke [name] is a 39 y.o. male returns to the ED after leaving AMA on 2/27. He was undergoing treatment with complications from IVDA. Patient reports being upset that is son could not visit and decided to leave. He tells me that "I was really stupid to leave." He has not experienced fever, chills, CP, or SOB since discharge. At time of AMA patient had a PICC line and was receiving Penicillin and Gentamicin for endocarditis. He was scheduled to go to the OR with ortho for drainage and debridement of forearm abscesses. Patient did use heroine yesterday "in a moment of weakness" but denies other drugs. Denies EtOH. No radiation of symptoms or modifying factors.   Past Medical History:  Diagnosis Date  . Anemia   . Anxiety  Dx 2008  . Chronic kidney disease   . Diabetes mellitus without complication ([place]) Dx [id number]  . DKA (diabetic ketoacidoses) ([place]) 07/21/2017  . GERD (gastroesophageal reflux disease) Dx 2008  . Headache(784.0)   . Hypertension   . Pneumonia   . Seizures ([place]) 2011    x 2 in lifetime. on Dilantin for a while.   . Substance abuse ([place]) 2013    heroin use, multiple relapses    Patient Active Problem List   Diagnosis Date Noted  . Endocarditis 05/20/2019  . Acute bacterial endocarditis   . Sepsis ([place]) 05/09/2019  . Heroin abuse ([place]) 05/09/2019  . Right forearm cellulitis   . Hypothyroidism   . Subacute osteomyelitis, left ankle and foot ([place])   . Cutaneous abscess of left foot 09/04/2018  . Diabetic ulcer of left foot ([place]) 08/28/2018  . Acute hematogenous osteomyelitis, left ankle and foot ([place])   . Streptococcal bacteremia 11/25/2017  . Hepatitis C 11/25/2017  . Actinomyces infection   . GERD (gastroesophageal reflux disease) 07/21/2017  . Diabetic polyneuropathy associated with type 1 diabetes  mellitus ([place])   . Prostatitis, acute   . MRSA infection   . Noncompliant bladder   . Urinary retention   . Prostate abscess   . ARF (acute renal failure) ([place]) 12/16/2016  . Anemia 12/16/2016  . Cocaine abuse ([place]) 08/16/2015  . Uncontrolled insulin dependent type 1 diabetes mellitus ([place]) 08/16/2015  . Cellulitis 12/03/2014  . AKI (acute kidney injury) ([place]) 12/03/2014  . Elevated liver enzymes 07/26/2014  . Dental caries 07/23/2014  . Tinea pedis 07/23/2014  . DKA (diabetic ketoacidoses) ([place]) 11/23/2013  . Abscess 10/14/2012  . CKD (chronic kidney disease) stage 3, GFR 30-59 ml/min 10/14/2012  . Drug abuse, IV ([place]) 10/11/2012  . Tobacco abuse 10/11/2012  . Essential hypertension 10/11/2012    Past Surgical History:  Procedure Laterality Date  . AMPUTATION Left 09/05/2018   Procedure: LEFT GREAT TOE AMPUTATION;  Surgeon: [name], MD;  Location: [facility];  Service: Orthopedics;  Laterality: Left;  . I & D EXTREMITY Left 10/11/2012   Procedure: IRRIGATION AND DEBRIDEMENT ABSCESS FOREARM;  Surgeon: Linna Hoff, MD;  Location: [facility];  Service: Orthopedics;  Laterality: Left;  . I & D EXTREMITY Left 10/12/2012   Procedure: IRRIGATION AND DEBRIDEMENT FOREARM;  Surgeon: Linna Hoff, MD;  Location: [facility];  Service: Orthopedics;  Laterality: Left;  . I & D EXTREMITY Left 10/14/2012   Procedure: incision and drainage left forearm;  Surgeon: Linna Hoff,  MD;  Location: [facility];  Service: Orthopedics;  Laterality: Left;  . I & D EXTREMITY Left 10/16/2012   Procedure: IRRIGATION AND DEBRIDEMENT LEFT FOREARM;  Surgeon: Linna Hoff, MD;  Location: [facility];  Service: Orthopedics;  Laterality: Left;  . I & D EXTREMITY Left 10/20/2012   Procedure: INCISION AND DRAINAGE AND DEBRIDEMENT LEFT  FOREARM;  Surgeon: Linna Hoff, MD;  Location: [facility];  Service: Orthopedics;  Laterality: Left;  . INCISION AND DRAINAGE OF WOUND Bilateral 11/29/2017   Procedure: DEBRIDEMENT BILATERAL FEET,  DEBRIDEMENT LEFT ANKLE, AND APPLY WOUND VAC;  Surgeon: [name], MD;  Location: [facility];  Service: Orthopedics;  Laterality: Bilateral;  . IRRIGATION AND DEBRIDEMENT ABSCESS     Hx: of left arm abscess related to drug use   . TEE WITHOUT CARDIOVERSION N/A 11/28/2017   Procedure: TRANSESOPHAGEAL ECHOCARDIOGRAM (TEE);  Surgeon: Sanda Klein, MD;  Location: [facility];  Service: Cardiovascular;  Laterality: N/A;  . TEE WITHOUT CARDIOVERSION N/A 05/12/2019   Procedure: TRANSESOPHAGEAL ECHOCARDIOGRAM (TEE);  Surgeon: Skeet Latch, MD;  Location: [facility] ENDOSCOPY;  Service: Cardiovascular;  Laterality: N/A;    Allergies Patient has no known allergies.  Family History  Problem Relation Age of Onset  . Diabetes Father   . Heart disease Father   . Mental illness Sister   . Cancer Neg Hx     Social History Social History   Tobacco Use  . Smoking status: Current Every Day Smoker    Packs/day: 1.00    Types: Cigarettes  . Smokeless tobacco: Never Used  Substance Use Topics  . Alcohol use: No    Alcohol/week: 0.0 standard drinks    Comment: occasional  . Drug use: Not Currently    Types: Cocaine, IV, Heroin    Comment: IVDU since Dad died in 2006/07/07 with 15 month period of sobriety 2018-2019, relapse Aug 2019.    Review of Systems  Constitutional: No fever/chills Eyes: No visual changes. ENT: No sore throat. Cardiovascular: Denies chest pain. Respiratory: Denies shortness of breath. Gastrointestinal: No abdominal pain.  No nausea, no vomiting.  No diarrhea.  No constipation. Genitourinary: Negative for dysuria. Musculoskeletal: Negative for back pain. Skin: Negative for rash. Neurological: Negative for headaches, focal weakness or numbness.  10-point ROS otherwise negative.  ____________________________________________   PHYSICAL EXAM:  VITAL SIGNS: ED Triage Vitals  Enc Vitals Group     BP 05/19/19 07-06-32 (!) 168/112     Pulse Rate 05/19/19 07/06/32 (!) 146     Resp  05/19/19 2134 16     Temp 05/19/19 2134 98.5 F (36.9 C)     Temp Source 05/19/19 07-06-32 Oral     SpO2 05/19/19 2134 100 %     Weight 05/19/19 07-06-2124 198 lb 6.6 oz (90 kg)     Height 05/19/19 07-06-2124 5\' 9"  (1.753 m)   Constitutional: Alert and oriented. Well appearing and in no acute distress. Eyes: Conjunctivae are normal.  Head: Atraumatic. Nose: No congestion/rhinnorhea. Mouth/Throat: Mucous membranes are moist.  Neck: No stridor.   Cardiovascular: Tachycardia. Good peripheral circulation. Grossly normal heart sounds.   Respiratory: Normal respiratory effort.  No retractions. Lungs CTAB. Gastrointestinal: Soft and nontender. No distention.  Musculoskeletal: No lower extremity tenderness nor edema. No gross deformities of extremities. Neurologic:  Normal speech and language.  Skin: Extensive scarring over bilateral arms with areas of erythema and purulence on the RUE.    ____________________________________________   LABS (all labs ordered are listed, but only abnormal results are  displayed)  Labs Reviewed  BASIC METABOLIC PANEL - Abnormal; Notable for the following components:      Result Value   Potassium 3.4 (*)    Creatinine, Ser 1.49 (*)    GFR calc non Af Amer 58 (*)    All other components within normal limits  CBC - Abnormal; Notable for the following components:   WBC 22.1 (*)    RBC 4.17 (*)    Hemoglobin 9.9 (*)    HCT 31.5 (*)    MCV 75.5 (*)    MCH 23.7 (*)    Platelets 724 (*)    All other components within normal limits  SEDIMENTATION RATE - Abnormal; Notable for the following components:   Sed Rate 62 (*)    All other components within normal limits  HEMOGLOBIN A1C - Abnormal; Notable for the following components:   Hgb A1c MFr Bld 10.9 (*)    All other components within normal limits  COMPREHENSIVE METABOLIC PANEL - Abnormal; Notable for the following components:   Sodium 133 (*)    Glucose, Bld 165 (*)    Calcium 8.5 (*)    Total Protein 6.3 (*)     Albumin 2.5 (*)    All other components within normal limits  GLUCOSE, CAPILLARY - Abnormal; Notable for the following components:   Glucose-Capillary 134 (*)    All other components within normal limits  CBG MONITORING, ED - Abnormal; Notable for the following components:   Glucose-Capillary 113 (*)    All other components within normal limits  TROPONIN I (HIGH SENSITIVITY) - Abnormal; Notable for the following components:   Troponin I (High Sensitivity) 20 (*)    All other components within normal limits  TROPONIN I (HIGH SENSITIVITY) - Abnormal; Notable for the following components:   Troponin I (High Sensitivity) 25 (*)    All other components within normal limits  CULTURE, BLOOD (ROUTINE X 2)  CULTURE, BLOOD (ROUTINE X 2)  PROTIME-INR  LACTIC ACID, PLASMA   ____________________________________________  EKG   EKG Interpretation  Date/Time:  Wednesday May 20 2019 00:24:52 EST Ventricular Rate:  123 PR Interval:    QRS Duration: 80 QT Interval:  300 QTC Calculation: 430 R Axis:   41 Text Interpretation: Sinus tachycardia Left atrial enlargement Nonspecific repol abnormality, inferior leads Borderline ST elevation, anterolateral leads No significant change since last tracing Confirmed by Ripley Fraise [phone]) on 05/20/2019 1:06:22 AM       ____________________________________________  RADIOLOGY  DG Chest 2 View  Result Date: 05/19/2019 CLINICAL DATA:  Tachycardia, IVDU EXAM: CHEST - 2 VIEW COMPARISON:  Radiograph 05/13/2019 FINDINGS: Low volumes with streaky opacities favoring atelectasis. No consolidative opacity is seen. No pneumothorax or effusion. The cardiomediastinal contours are unremarkable. No acute osseous or soft tissue abnormality. IMPRESSION: No acute cardiopulmonary abnormality. Electronically Signed   By: Lovena Le M.D.   On: 05/19/2019 21:59    ____________________________________________   PROCEDURES  Procedure(s) performed:    Procedures  Emergency Ultrasound Study:   Angiocath insertion Performed by: Margette Fast  Consent: Verbal consent obtained. Risks and benefits: risks, benefits and alternatives were discussed Immediately prior to procedure the correct patient, procedure, equipment, support staff and site/side marked as needed.  Indication: difficult IV access Preparation: Patient was prepped and draped in the usual sterile fashion. Vein Location: Left upper arm was visualized during assessment for potential access sites and was found to be patent/ easily compressed with linear ultrasound.  The needle was visualized with real-time ultrasound  and guided into the vein. Gauge: 20  Normal blood return.  Patient tolerance: Patient tolerated the procedure well with no immediate complications.     ____________________________________________   INITIAL IMPRESSION / ASSESSMENT AND PLAN / ED COURSE  Pertinent labs & imaging results that were available during my care of the patient were reviewed by me and considered in my medical decision making (see chart for details).   Patient returns to the ED to continue treatment for endocarditis and forearm abscesses. Afebrile but tachy on arrival. Leukocytosis noted on labs. Complicated IV access but able to obtain PIV in the ED. Near AMA discharge central access was considered. Restarting abx and will admit.   Discussed patient's case with TR[facility] to request admission. Patient and family (if present) updated with plan. Care transferred to [facility] service.  I reviewed all nursing notes, vitals, pertinent old records, EKGs, labs, imaging (as available).    ____________________________________________  FINAL CLINICAL IMPRESSION(S) / ED DIAGNOSES  Final diagnoses:  Bacterial endocarditis, unspecified chronicity  Abscess of right forearm     MEDICATIONS GIVEN DURING THIS VISIT:  Medications  acetaminophen (TYLENOL) tablet 650 mg (has no administration in time  range)  amLODipine (NORVASC) tablet 5 mg (5 mg Oral Given 05/20/19 0900)  atorvastatin (LIPITOR) tablet 20 mg (20 mg Oral Given 05/20/19 0901)  lisinopril (ZESTRIL) tablet 10 mg (10 mg Oral Given 05/20/19 0902)  DULoxetine (CYMBALTA) DR capsule 30 mg (30 mg Oral Given 05/20/19 0901)  sertraline (ZOLOFT) tablet 50 mg (50 mg Oral Given 05/20/19 0903)  levothyroxine (SYNTHROID) tablet 50 mcg (50 mcg Oral Given 05/20/19 0902)  pantoprazole (PROTONIX) EC tablet 40 mg (40 mg Oral Given 05/20/19 0902)  Melatonin TABS 3 mg (has no administration in time range)  gabapentin (NEURONTIN) capsule 400 mg (400 mg Oral Given 05/20/19 0902)  insulin glargine (LANTUS) injection 20 Units (has no administration in time range)  enoxaparin (LOVENOX) injection 40 mg (40 mg Subcutaneous Not Given 05/20/19 0859)  insulin aspart (novoLOG) injection 0-15 Units (0 Units Subcutaneous Not Given 05/20/19 0904)  gentamicin (GARAMYCIN) 210 mg in dextrose 5 % 100 mL IVPB (0 mg/kg  70.7 kg (Ideal) Intravenous Stopped 05/20/19 0230)  penicillin G potassium 12 Million Units in dextrose 5 % 500 mL continuous infusion (12 Million Units Intravenous New Bag/Given 05/20/19 0233)  sodium chloride flush (NS) 0.9 % injection 3 mL (3 mLs Intravenous Given by Other 05/20/19 0753)  potassium chloride SA (KLOR-CON) CR tablet 40 mEq (40 mEq Oral Given 05/20/19 0124)  lactated ringers bolus 2,000 mL (0 mLs Intravenous Stopped 05/20/19 0352)     Note:  This document was prepared using Dragon voice recognition software and may include unintentional dictation errors.  Nanda Quinton, MD, [facility] Emergency Medicine    Eisa Necaise, Wonda [facility], MD 05/20/19 1254

## 2019-05-19 NOTE — ED Triage Notes (Addendum)
Patient stated " I have and infection in my heart" , denies chest pain , respirations unlabored , no emesis or diaphoresis , denies fever or chills . HR= 150+ at arrival. Heroin yesterday.

## 2019-05-19 NOTE — ED Notes (Signed)
Pt came to the ED per triage complaint. Pt conscious, breathing, and A&Ox4. Pt brought back to [facility] via wheelchair. Pt endorses "I have a heart infection and left AMA last time". Chest rise and fall equally with non-labored breathing. Lungs clear apex to base. Abd soft and non-tender. Pt denies chest pain, n/v/d, shortness of breath, and f/c. PIVC placed on the left arm with a 18G which had positive blood return and flushed without pain or infiltration. Blood collected, labeled, and sent to lab. Bed in lowest position with call light within reach. Pt on continuous blood pressure, pulse ox, and cardiac monitor. Will continue to monitor. Awaiting MD eval. No distress noted.

## 2019-05-20 ENCOUNTER — Encounter (HOSPITAL_COMMUNITY): Payer: Self-pay | Admitting: Family Medicine

## 2019-05-20 DIAGNOSIS — K219 Gastro-esophageal reflux disease without esophagitis: Secondary | ICD-10-CM | POA: Diagnosis present

## 2019-05-20 DIAGNOSIS — N1831 Chronic kidney disease, stage 3a: Secondary | ICD-10-CM | POA: Diagnosis present

## 2019-05-20 DIAGNOSIS — E871 Hypo-osmolality and hyponatremia: Secondary | ICD-10-CM | POA: Diagnosis not present

## 2019-05-20 DIAGNOSIS — E785 Hyperlipidemia, unspecified: Secondary | ICD-10-CM | POA: Diagnosis present

## 2019-05-20 DIAGNOSIS — I330 Acute and subacute infective endocarditis: Secondary | ICD-10-CM

## 2019-05-20 DIAGNOSIS — F1410 Cocaine abuse, uncomplicated: Secondary | ICD-10-CM

## 2019-05-20 DIAGNOSIS — I058 Other rheumatic mitral valve diseases: Secondary | ICD-10-CM | POA: Diagnosis present

## 2019-05-20 DIAGNOSIS — L02413 Cutaneous abscess of right upper limb: Secondary | ICD-10-CM | POA: Diagnosis present

## 2019-05-20 DIAGNOSIS — N179 Acute kidney failure, unspecified: Secondary | ICD-10-CM | POA: Diagnosis not present

## 2019-05-20 DIAGNOSIS — F329 Major depressive disorder, single episode, unspecified: Secondary | ICD-10-CM | POA: Diagnosis present

## 2019-05-20 DIAGNOSIS — F1910 Other psychoactive substance abuse, uncomplicated: Secondary | ICD-10-CM | POA: Diagnosis not present

## 2019-05-20 DIAGNOSIS — D631 Anemia in chronic kidney disease: Secondary | ICD-10-CM | POA: Diagnosis present

## 2019-05-20 DIAGNOSIS — E1065 Type 1 diabetes mellitus with hyperglycemia: Secondary | ICD-10-CM | POA: Diagnosis present

## 2019-05-20 DIAGNOSIS — F1110 Opioid abuse, uncomplicated: Secondary | ICD-10-CM | POA: Diagnosis present

## 2019-05-20 DIAGNOSIS — Z794 Long term (current) use of insulin: Secondary | ICD-10-CM | POA: Diagnosis not present

## 2019-05-20 DIAGNOSIS — F17210 Nicotine dependence, cigarettes, uncomplicated: Secondary | ICD-10-CM | POA: Diagnosis present

## 2019-05-20 DIAGNOSIS — D62 Acute posthemorrhagic anemia: Secondary | ICD-10-CM | POA: Diagnosis not present

## 2019-05-20 DIAGNOSIS — I059 Rheumatic mitral valve disease, unspecified: Secondary | ICD-10-CM | POA: Diagnosis present

## 2019-05-20 DIAGNOSIS — E039 Hypothyroidism, unspecified: Secondary | ICD-10-CM | POA: Diagnosis present

## 2019-05-20 DIAGNOSIS — E1042 Type 1 diabetes mellitus with diabetic polyneuropathy: Secondary | ICD-10-CM | POA: Diagnosis present

## 2019-05-20 DIAGNOSIS — F1990 Other psychoactive substance use, unspecified, uncomplicated: Secondary | ICD-10-CM | POA: Diagnosis present

## 2019-05-20 DIAGNOSIS — L03113 Cellulitis of right upper limb: Secondary | ICD-10-CM | POA: Diagnosis present

## 2019-05-20 DIAGNOSIS — E1165 Type 2 diabetes mellitus with hyperglycemia: Secondary | ICD-10-CM

## 2019-05-20 DIAGNOSIS — E43 Unspecified severe protein-calorie malnutrition: Secondary | ICD-10-CM | POA: Diagnosis present

## 2019-05-20 DIAGNOSIS — N1832 Chronic kidney disease, stage 3b: Secondary | ICD-10-CM | POA: Diagnosis not present

## 2019-05-20 DIAGNOSIS — I38 Endocarditis, valve unspecified: Secondary | ICD-10-CM | POA: Diagnosis present

## 2019-05-20 DIAGNOSIS — F419 Anxiety disorder, unspecified: Secondary | ICD-10-CM | POA: Diagnosis present

## 2019-05-20 DIAGNOSIS — A408 Other streptococcal sepsis: Secondary | ICD-10-CM | POA: Diagnosis present

## 2019-05-20 DIAGNOSIS — I129 Hypertensive chronic kidney disease with stage 1 through stage 4 chronic kidney disease, or unspecified chronic kidney disease: Secondary | ICD-10-CM | POA: Diagnosis present

## 2019-05-20 DIAGNOSIS — B182 Chronic viral hepatitis C: Secondary | ICD-10-CM | POA: Diagnosis present

## 2019-05-20 DIAGNOSIS — R7881 Bacteremia: Secondary | ICD-10-CM

## 2019-05-20 LAB — COMPREHENSIVE METABOLIC PANEL
ALT: 25 U/L (ref 0–44)
AST: 19 U/L (ref 15–41)
Albumin: 2.5 g/dL — ABNORMAL LOW (ref 3.5–5.0)
Alkaline Phosphatase: 119 U/L (ref 38–126)
Anion gap: 11 (ref 5–15)
BUN: 14 mg/dL (ref 6–20)
CO2: 23 mmol/L (ref 22–32)
Calcium: 8.5 mg/dL — ABNORMAL LOW (ref 8.9–10.3)
Chloride: 99 mmol/L (ref 98–111)
Creatinine, Ser: 1.2 mg/dL (ref 0.61–1.24)
GFR calc Af Amer: >60 mL/min (ref >60)
GFR calc non Af Amer: >60 mL/min (ref >60)
Glucose, Bld: 165 mg/dL — ABNORMAL HIGH (ref 70–99)
Potassium: 4 mmol/L (ref 3.5–5.1)
Sodium: 133 mmol/L — ABNORMAL LOW (ref 135–145)
Total Bilirubin: 0.5 mg/dL (ref 0.3–1.2)
Total Protein: 6.3 g/dL — ABNORMAL LOW (ref 6.5–8.1)

## 2019-05-20 LAB — GLUCOSE, CAPILLARY
Glucose-Capillary: 134 mg/dL — ABNORMAL HIGH (ref 70–99)
Glucose-Capillary: 208 mg/dL — ABNORMAL HIGH (ref 70–99)
Glucose-Capillary: 91 mg/dL (ref 70–99)

## 2019-05-20 LAB — CBG MONITORING, ED: Glucose-Capillary: 113 mg/dL — ABNORMAL HIGH (ref 70–99)

## 2019-05-20 LAB — LACTIC ACID, PLASMA: Lactic Acid, Venous: 1 mmol/L (ref 0.5–1.9)

## 2019-05-20 LAB — HEMOGLOBIN A1C
Hgb A1c MFr Bld: 10.9 % — ABNORMAL HIGH (ref 4.8–5.6)
Mean Plasma Glucose: 266.13 mg/dL

## 2019-05-20 LAB — TROPONIN I (HIGH SENSITIVITY): Troponin I (High Sensitivity): 25 ng/L — ABNORMAL HIGH (ref <18)

## 2019-05-20 LAB — SEDIMENTATION RATE: Sed Rate: 62 mm/hr — ABNORMAL HIGH (ref 0–16)

## 2019-05-20 MED ORDER — LACTATED RINGERS IV BOLUS
2000.0000 mL | Freq: Once | INTRAVENOUS | Status: AC
  Administered 2019-05-20: 2000 mL via INTRAVENOUS

## 2019-05-20 MED ORDER — ATORVASTATIN CALCIUM 10 MG PO TABS
20.0000 mg | ORAL_TABLET | Freq: Every day | ORAL | Status: DC
  Administered 2019-05-20 – 2019-05-29 (×10): 20 mg via ORAL
  Filled 2019-05-20 (×10): qty 2

## 2019-05-20 MED ORDER — SERTRALINE HCL 50 MG PO TABS
50.0000 mg | ORAL_TABLET | Freq: Every day | ORAL | Status: DC
  Administered 2019-05-20 – 2019-05-29 (×10): 50 mg via ORAL
  Filled 2019-05-20 (×11): qty 1

## 2019-05-20 MED ORDER — ACETAMINOPHEN 325 MG PO TABS
650.0000 mg | ORAL_TABLET | Freq: Four times a day (QID) | ORAL | Status: DC | PRN
  Filled 2019-05-20: qty 2

## 2019-05-20 MED ORDER — PENICILLIN G POTASSIUM 20000000 UNITS IJ SOLR
12.0000 10*6.[IU] | Freq: Two times a day (BID) | INTRAVENOUS | Status: DC
  Administered 2019-05-20 – 2019-05-22 (×5): 12 10*6.[IU] via INTRAVENOUS
  Filled 2019-05-20 (×7): qty 12

## 2019-05-20 MED ORDER — INSULIN ASPART 100 UNIT/ML ~~LOC~~ SOLN
0.0000 [IU] | Freq: Three times a day (TID) | SUBCUTANEOUS | Status: DC
  Administered 2019-05-20: 5 [IU] via SUBCUTANEOUS
  Administered 2019-05-20: 2 [IU] via SUBCUTANEOUS
  Administered 2019-05-21: 15 [IU] via SUBCUTANEOUS
  Administered 2019-05-21 (×2): 8 [IU] via SUBCUTANEOUS
  Administered 2019-05-22: 3 [IU] via SUBCUTANEOUS
  Administered 2019-05-22: 15 [IU] via SUBCUTANEOUS
  Administered 2019-05-23: 5 [IU] via SUBCUTANEOUS
  Administered 2019-05-23: 15 [IU] via SUBCUTANEOUS
  Administered 2019-05-24: 3 [IU] via SUBCUTANEOUS
  Administered 2019-05-24: 11 [IU] via SUBCUTANEOUS
  Administered 2019-05-24: 2 [IU] via SUBCUTANEOUS
  Administered 2019-05-25: 3 [IU] via SUBCUTANEOUS
  Administered 2019-05-25: 8 [IU] via SUBCUTANEOUS
  Administered 2019-05-26: 3 [IU] via SUBCUTANEOUS
  Administered 2019-05-26: 5 [IU] via SUBCUTANEOUS
  Administered 2019-05-26: 11 [IU] via SUBCUTANEOUS
  Administered 2019-05-27 (×3): 8 [IU] via SUBCUTANEOUS
  Administered 2019-05-28: 5 [IU] via SUBCUTANEOUS
  Administered 2019-05-28: 11 [IU] via SUBCUTANEOUS
  Administered 2019-05-28: 3 [IU] via SUBCUTANEOUS
  Administered 2019-05-29: 8 [IU] via SUBCUTANEOUS

## 2019-05-20 MED ORDER — POTASSIUM CHLORIDE CRYS ER 20 MEQ PO TBCR
40.0000 meq | EXTENDED_RELEASE_TABLET | Freq: Once | ORAL | Status: AC
  Administered 2019-05-20: 40 meq via ORAL
  Filled 2019-05-20: qty 2

## 2019-05-20 MED ORDER — GABAPENTIN 400 MG PO CAPS
400.0000 mg | ORAL_CAPSULE | Freq: Three times a day (TID) | ORAL | Status: DC
  Administered 2019-05-20 – 2019-05-24 (×12): 400 mg via ORAL
  Filled 2019-05-20 (×13): qty 1

## 2019-05-20 MED ORDER — PANTOPRAZOLE SODIUM 40 MG PO TBEC
40.0000 mg | DELAYED_RELEASE_TABLET | Freq: Every day | ORAL | Status: DC
  Administered 2019-05-20 – 2019-05-29 (×10): 40 mg via ORAL
  Filled 2019-05-20 (×10): qty 1

## 2019-05-20 MED ORDER — LEVOTHYROXINE SODIUM 50 MCG PO TABS
50.0000 ug | ORAL_TABLET | Freq: Every day | ORAL | Status: DC
  Administered 2019-05-20 – 2019-05-29 (×9): 50 ug via ORAL
  Filled 2019-05-20 (×9): qty 1

## 2019-05-20 MED ORDER — DULOXETINE HCL 30 MG PO CPEP
30.0000 mg | ORAL_CAPSULE | Freq: Every day | ORAL | Status: DC
  Administered 2019-05-20 – 2019-05-29 (×10): 30 mg via ORAL
  Filled 2019-05-20 (×11): qty 1

## 2019-05-20 MED ORDER — GENTAMICIN SULFATE 40 MG/ML IJ SOLN
3.0000 mg/kg | INTRAVENOUS | Status: DC
  Administered 2019-05-20 – 2019-05-24 (×5): 210 mg via INTRAVENOUS
  Filled 2019-05-20 (×6): qty 5.25

## 2019-05-20 MED ORDER — ENOXAPARIN SODIUM 40 MG/0.4ML ~~LOC~~ SOLN
40.0000 mg | Freq: Every day | SUBCUTANEOUS | Status: DC
  Administered 2019-05-21 – 2019-05-26 (×5): 40 mg via SUBCUTANEOUS
  Filled 2019-05-20 (×6): qty 0.4

## 2019-05-20 MED ORDER — LISINOPRIL 10 MG PO TABS
10.0000 mg | ORAL_TABLET | Freq: Every day | ORAL | Status: DC
  Administered 2019-05-20 – 2019-05-23 (×4): 10 mg via ORAL
  Filled 2019-05-20 (×4): qty 1

## 2019-05-20 MED ORDER — AMLODIPINE BESYLATE 5 MG PO TABS
5.0000 mg | ORAL_TABLET | Freq: Every day | ORAL | Status: DC
  Administered 2019-05-20 – 2019-05-23 (×4): 5 mg via ORAL
  Filled 2019-05-20 (×4): qty 1

## 2019-05-20 MED ORDER — LACTATED RINGERS IV BOLUS: 1000.0000 mL | Freq: Once | INTRAVENOUS | Status: DC

## 2019-05-20 MED ORDER — INSULIN GLARGINE 100 UNIT/ML ~~LOC~~ SOLN
20.0000 [IU] | Freq: Two times a day (BID) | SUBCUTANEOUS | Status: DC
  Administered 2019-05-20 – 2019-05-21 (×2): 20 [IU] via SUBCUTANEOUS
  Filled 2019-05-20 (×5): qty 0.2

## 2019-05-20 MED ORDER — MELATONIN 3 MG PO TABS
3.0000 mg | ORAL_TABLET | Freq: Every evening | ORAL | Status: DC | PRN
  Administered 2019-05-21 – 2019-05-23 (×2): 3 mg via ORAL
  Filled 2019-05-20 (×3): qty 1

## 2019-05-20 NOTE — Progress Notes (Signed)
PROGRESS NOTE  Luke [name] VQX:[id number] DOB: Apr 04, 1980 DOA: 05/19/2019 PCP: Gildardo Pounds, NP  Brief history:   Luke [name] is a 39 y.o. 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  Patient recently admitted on 2/20 for sepsis secondary to endocarditis and ultimately left AMA on 2/27 prior to completion of IV abx treatment. He reports feeling scared and that is why he left but after talking with family, they convinced him to come back and complete treatment. Denies any complaints currently. He last used heroin yesterday. Reports he did not take his blood pressure or DM medications today but has otherwise been compliant. Denies any pain. Denies problems eating or drinking. Denies fever, chills, cough, SOB, chest pain, abdominal pain, nausea, vomiting, diarrhea, constipation, dysuria, hematuria, hematochezia, melena, difficulty moving arms/legs, speech difficulty, trouble eating, confusion or any other complaints.  In the ED: Tachycardic with elevated blood pressure otherwise afebrile and stable. Labs: K 3.4, glucose 93, Cr 1.49, WBC 22.1, Hgb 9.9, Trop 20>25, ESR 62. ED provider called for admission for continuation of prior IV abx treatment.   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, in setting of IVDU; sepsis present on admission  -Tachycardic on admission with WBC 22.1 -Continue gent and penicillin -Case discussed with infectious disease who will do formal consult  Right forearm abscesses -Ortho consulted  Back pain, get MRI  Insulin-dependent type 2 diabetes Continue insulin, just as needed  Hypertension, stable on home medication Synthroid and Norvasc  Hyperlipidemia ,continue statin  Hypothyroidism, continue Synthroid  Polysubstance abuse Education provided, patient states he is ready to quit  DVT Prophylaxis: Lovenox  Code Status: Full  Family Communication: patient   Disposition Plan:    Patient came from:                    home                                                                                      Anticipated d/c place: home   Barriers to d/c OR conditions which need to be met to effect a safe d/c: needs iv abx , h/o ivdu, needs ID and ortho consult, not ready to discharge   Consultants:  Ortho  ID  Procedures:  None  Antibiotics:  Gentamicin and penicillin G   Objective: BP (!) 158/96   Pulse (!) 110   Temp  98.5 F (36.9 C) (Oral)   Resp (!) 21   Ht '5\' 9"'$  (1.753 m)   Wt 90 kg   SpO2 99%   BMI 29.30 kg/m   Intake/Output Summary (Last 24 hours) at 05/20/2019 4034 Last data filed at 05/20/2019 7425 Gross per 24 hour  Intake 2100 ml  Output 300 ml  Net 1800 ml   Filed Weights   05/19/19 2126  Weight: 90 kg    Exam: Patient is examined daily including today on 05/20/2019, exams remain the same as of yesterday except that has changed    General:  NAD  Cardiovascular: RRR  Respiratory: CTABL  Abdomen: Soft/ND/NT, positive BS  Musculoskeletal: multiple necrotic wounds,induration, track marks, nontender, no active drainage, tenderness mid back and low back  Neuro: alert, oriented   Data Reviewed: Basic Metabolic  Panel: Recent Labs  Lab 05/14/19 0435 05/15/19 0417 05/16/19 0434 05/19/19 2138  NA 136 133* 131* 140  K 4.0 3.7 4.4 3.4*  CL 100 98 98 106  CO2 '27 25 25 22  '$ GLUCOSE 256* 324* 469* 93  BUN '16 16 18 17  '$ CREATININE 1.31* 1.29* 1.62* 1.49*  CALCIUM 8.9 8.7* 8.7* 9.2  MG 1.8 1.7 1.8  --   PHOS 4.2 4.7* 4.5  --    Liver Function Tests: Recent Labs  Lab 05/14/19 0435 05/15/19 0417 05/16/19 0434  AST '21 27 23  '$ ALT '23 29 28  '$ ALKPHOS 132* 124 117  BILITOT 0.4 0.1* 0.2*  PROT 6.6 6.0* 6.0*  ALBUMIN 2.6* 2.4* 2.4*   No results for input(s): LIPASE, AMYLASE in the last 168 hours. No results for input(s): AMMONIA in the last 168 hours. CBC: Recent Labs  Lab 05/14/19 0435 05/15/19 0417 05/16/19 0434 05/19/19 2138  WBC 14.9* 15.4* 14.4* 22.1*  NEUTROABS 8.5* 9.4* 9.1*  --   HGB 9.6* 9.2* 9.0* 9.9*  HCT 30.5* 28.8* 28.5* 31.5*  MCV 74.2* 74.4* 74.8* 75.5*  PLT 552* 587* 549* 724*   Cardiac Enzymes:   No results for input(s): CKTOTAL, CKMB, CKMBINDEX, TROPONINI in the last 168 hours. BNP (last 3 results) No results for input(s): BNP in the last 8760 hours.  ProBNP (last 3 results) No results for input(s): PROBNP in the last 8760 hours.  CBG: Recent Labs  Lab 05/15/19 1157 05/15/19 1630 05/15/19 2107 05/16/19 0734 05/16/19 1206  GLUCAP 404* 73 455* 394* 328*    Recent Results (from the past 240 hour(s))  Culture, blood (Routine X 2) w Reflex to ID Panel     Status: None   Collection Time: 05/10/19  3:22 PM   Specimen: BLOOD  Result Value Ref Range Status   Specimen Description BLOOD LEFT ARM  Final   Special Requests   Final    BOTTLES DRAWN AEROBIC ONLY Blood Culture adequate volume Performed at [facility], [place] [address], [place], [place] [address]    Culture NO GROWTH 9 DAYS  Final   Report Status 05/19/2019 FINAL  Final  Culture, blood (Routine X 2) w Reflex to ID Panel     Status: None   Collection Time: 05/10/19  3:29 PM   Specimen: BLOOD   Result Value Ref Range Status   Specimen Description BLOOD LEFT ARM  Final   Special Requests   Final    BOTTLES DRAWN AEROBIC ONLY Blood Culture adequate volume Performed at [facility], [place] [address], [place], [place] [address]    Culture NO GROWTH 9 DAYS  Final  Report Status 05/19/2019 FINAL  Final  MRSA PCR Screening     Status: None   Collection Time: 05/11/19  5:25 PM   Specimen: Nasal Mucosa; Nasopharyngeal  Result Value Ref Range Status   MRSA by PCR NEGATIVE NEGATIVE Final    Comment:        The GeneXpert MRSA Assay (FDA approved for NASAL specimens only), is one component of a comprehensive MRSA colonization surveillance program. It is not intended to diagnose MRSA infection nor to guide or monitor treatment for MRSA infections. Performed at [facility], [place] [address], [place], [place] [address]      Studies: DG Chest 2 View  Result Date: 05/19/2019 CLINICAL DATA:  Tachycardia, IVDU EXAM: CHEST - 2 VIEW COMPARISON:  Radiograph 05/13/2019 FINDINGS: Low volumes with streaky opacities favoring atelectasis. No consolidative opacity is seen. No pneumothorax or effusion. The cardiomediastinal contours are unremarkable. No acute osseous or soft tissue abnormality. IMPRESSION: No acute cardiopulmonary abnormality. Electronically Signed   By: Lovena Le M.D.   On: 05/19/2019 21:59    Scheduled Meds: . amLODipine  5 mg Oral Daily  . atorvastatin  20 mg Oral Daily  . DULoxetine  30 mg Oral Daily  . enoxaparin (LOVENOX) injection  40 mg Subcutaneous Daily  . gabapentin  400 mg Oral TID  . insulin aspart  0-15 Units Subcutaneous TID WC  . insulin glargine  20 Units Subcutaneous BID  . levothyroxine  50 mcg Oral Daily  . lisinopril  10 mg Oral Daily  . pantoprazole  40 mg Oral Daily  . sertraline  50 mg Oral Daily  . sodium chloride flush  3 mL Intravenous Once    Continuous Infusions: . gentamicin Stopped (05/20/19 0230)  . penicillin g continuous IV  infusion 12 Million Units (05/20/19 0233)     Time spent: 1mns I have personally reviewed and interpreted on  05/20/2019 daily labs, tele strips, imagings as discussed above under date review session and assessment and plans.  I reviewed all nursing notes, pharmacy notes, consultant notes,  vitals, pertinent old records  I have discussed plan of care as described above with RN , patient  on 05/20/2019   FFlorencia ReasonsMD, PhD, FACP  [facility]  Available via Epic secure chat 7am-7pm for nonurgent issues Please page for urgent issues, pager number available through [email or web address] .   05/20/2019, 7:33 AM  LOS: 0 days

## 2019-05-20 NOTE — ED Notes (Signed)
Breakfast ordered 

## 2019-05-20 NOTE — ED Notes (Signed)
Pt resting in bed. Pt denies new or worsening complaints. Will continue to monitor. No distress noted. Pt on continuous monitoring via blood pressure, pulse ox, and cardiac monitor.  

## 2019-05-20 NOTE — Consult Note (Addendum)
Reason for Consult:right forearm abscess Referring Physician: Dr Luke [name] is an 39 y.o. 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 Dr Luke [name] evaluated last week and scheduled OR for I&D on Sat but patient signed out AMA. Returned to hospital today. Afebrile. No significant pain HPI:   Past Medical History:  Diagnosis Date  . Anemia   . Anxiety  Dx 2008  . Chronic kidney disease   . Diabetes mellitus without complication ([place]) Dx [id number]  . DKA (diabetic ketoacidoses) ([place]) 07/21/2017  . GERD (gastroesophageal reflux disease) Dx 2008  . Headache(784.0)   . Hypertension   . Pneumonia   . Seizures ([place]) 2011    x 2 in lifetime. on Dilantin for a while.   . 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 AMPUTATION Left 09/05/2018   Procedure: LEFT GREAT TOE AMPUTATION;  Surgeon: [name], MD;  Location: [facility];  Service: Orthopedics;  Laterality: Left;  . I & D EXTREMITY Left 10/11/2012   Procedure: IRRIGATION AND DEBRIDEMENT ABSCESS FOREARM;  Surgeon: Linna Hoff, MD;  Location: [facility];  Service: Orthopedics;  Laterality: Left;  . I & D EXTREMITY Left 10/12/2012   Procedure: IRRIGATION AND DEBRIDEMENT FOREARM;  Surgeon: Linna Hoff, MD;  Location: [facility];  Service: Orthopedics;  Laterality: Left;  . I & D EXTREMITY Left 10/14/2012   Procedure: incision and drainage left forearm;  Surgeon: Linna Hoff, MD;  Location: [facility];  Service: Orthopedics;  Laterality: Left;  . I & D EXTREMITY Left 10/16/2012   Procedure: IRRIGATION AND DEBRIDEMENT LEFT FOREARM;  Surgeon: Linna Hoff, MD;  Location: [facility];  Service: Orthopedics;  Laterality: Left;  . I & D EXTREMITY Left 10/20/2012   Procedure: INCISION AND DRAINAGE AND DEBRIDEMENT LEFT  FOREARM;  Surgeon: Linna Hoff, MD;  Location: [facility];  Service: Orthopedics;  Laterality: Left;  . INCISION AND DRAINAGE OF WOUND Bilateral  11/29/2017   Procedure: DEBRIDEMENT BILATERAL FEET, DEBRIDEMENT LEFT ANKLE, AND APPLY WOUND VAC;  Surgeon: [name], MD;  Location: [facility];  Service: Orthopedics;  Laterality: Bilateral;  . IRRIGATION AND DEBRIDEMENT ABSCESS     Hx: of left arm abscess related to drug use   . TEE WITHOUT CARDIOVERSION N/A 11/28/2017   Procedure: TRANSESOPHAGEAL ECHOCARDIOGRAM (TEE);  Surgeon: Sanda Klein, MD;  Location: [facility];  Service: Cardiovascular;  Laterality: N/A;  . TEE WITHOUT CARDIOVERSION N/A 05/12/2019   Procedure: TRANSESOPHAGEAL ECHOCARDIOGRAM (TEE);  Surgeon: Skeet Latch, MD;  Location: [facility] ENDOSCOPY;  Service: Cardiovascular;  Laterality: N/A;    Family History  Problem Relation Age of Onset  . Diabetes Father   . Heart disease Father   . Mental illness Sister   . Cancer Neg Hx     Social History:  reports that he has been smoking cigarettes. He has been smoking about 1.00 pack per day. He has never used smokeless tobacco. He reports previous drug use. Drugs: Cocaine, IV, and Heroin. He reports that he does not drink alcohol.  Allergies: No Known Allergies  Medications reviewed  Results for orders placed or performed during the hospital encounter of 05/19/19 (from the past 48 hour(s))  Basic metabolic panel     Status: Abnormal   Collection Time: 05/19/19  9:38 PM  Result Value Ref Range   Sodium 140 135 - 145 mmol/L  Potassium 3.4 (L) 3.5 - 5.1 mmol/L   Chloride 106 98 - 111 mmol/L   CO2 22 22 - 32 mmol/L   Glucose, Bld 93 70 - 99 mg/dL    Comment: Glucose reference range applies only to samples taken after fasting for at least 8 hours.   BUN 17 6 - 20 mg/dL   Creatinine, Ser 1.49 (H) 0.61 - 1.24 mg/dL   Calcium 9.2 8.9 - 10.3 mg/dL   GFR calc non Af Amer 58 (L) >60 mL/min   GFR calc Af Amer >60 >60 mL/min   Anion gap 12 5 - 15    Comment: Performed at [facility] [address] [place], [place] [address]  CBC     Status: Abnormal   Collection  Time: 05/19/19  9:38 PM  Result Value Ref Range   WBC 22.1 (H) 4.0 - 10.5 K/uL   RBC 4.17 (L) 4.22 - 5.81 MIL/uL   Hemoglobin 9.9 (L) 13.0 - 17.0 g/dL   HCT 31.5 (L) 39.0 - 52.0 %   MCV 75.5 (L) 80.0 - 100.0 fL   MCH 23.7 (L) 26.0 - 34.0 pg   MCHC 31.4 30.0 - 36.0 g/dL   RDW 14.9 11.5 - 15.5 %   Platelets 724 (H) 150 - 400 K/uL   nRBC 0.0 0.0 - 0.2 %    Comment: Performed at [facility], [place] [address], [place], [place] [address]  Troponin I (High Sensitivity)     Status: Abnormal   Collection Time: 05/19/19  9:38 PM  Result Value Ref Range   Troponin I (High Sensitivity) 20 (H) <18 ng/L    Comment: (NOTE) Elevated high sensitivity troponin I (hsTnI) values and significant  changes across serial measurements may suggest ACS but many other  chronic and acute conditions are known to elevate hsTnI results.  Refer to the "Links" section for chest pain algorithms and additional  guidance. Performed at [facility], [place] [address], [place], [place] [address]   Protime-INR (order if Patient is taking Coumadin / Warfarin)     Status: None   Collection Time: 05/19/19  9:38 PM  Result Value Ref Range   Prothrombin Time 12.4 11.4 - 15.2 seconds   INR 0.9 0.8 - 1.2    Comment: (NOTE) INR goal varies based on device and disease states. Performed at [facility], [place] [address], [place], [place] [address]   Troponin I (High Sensitivity)     Status: Abnormal   Collection Time: 05/19/19  9:38 PM  Result Value Ref Range   Troponin I (High Sensitivity) 25 (H) <18 ng/L    Comment: (NOTE) Elevated high sensitivity troponin I (hsTnI) values and significant  changes across serial measurements may suggest ACS but many other  chronic and acute conditions are known to elevate hsTnI results.  Refer to the "Links" section for chest pain algorithms and additional  guidance. Performed at [facility], [place] [address] [place], [place] [address]   Sedimentation rate      Status: Abnormal   Collection Time: 05/19/19  9:38 PM  Result Value Ref Range   Sed Rate 62 (H) 0 - 16 mm/hr    Comment: Performed at [facility] [address], [place], [place] [address]  Hemoglobin A1c     Status: Abnormal   Collection Time: 05/20/19  1:30 AM  Result Value Ref Range   Hgb A1c MFr Bld 10.9 (H) 4.8 - 5.6 %  Comment: (NOTE) Pre diabetes:          5.7%-6.4% Diabetes:              >6.4% Glycemic control for   <7.0% adults with diabetes    Mean Plasma Glucose 266.13 mg/dL    Comment: Performed at [facility] [address] [place], [place] [address]  Lactic acid, plasma     Status: None   Collection Time: 05/20/19  2:08 AM  Result Value Ref Range   Lactic Acid, Venous 1.0 0.5 - 1.9 mmol/L    Comment: Performed at [facility] [address] [place], [place] [address]  Culture, blood (routine x 2)     Status: None (Preliminary result)   Collection Time: 05/20/19  8:10 AM   Specimen: BLOOD RIGHT ARM  Result Value Ref Range   Specimen Description BLOOD RIGHT ARM    Special Requests      BOTTLES DRAWN AEROBIC ONLY Blood Culture results may not be optimal due to an inadequate volume of blood received in culture bottles   Culture      NO GROWTH < 12 HOURS Performed at [facility], [place] [address] [place], [place] [address]    Report Status PENDING   Culture, blood (routine x 2)     Status: None (Preliminary result)   Collection Time: 05/20/19  8:10 AM   Specimen: BLOOD RIGHT ARM  Result Value Ref Range   Specimen Description BLOOD RIGHT ARM    Special Requests      BOTTLES DRAWN AEROBIC ONLY Blood Culture results may not be optimal due to an inadequate volume of blood received in culture bottles   Culture      NO GROWTH < 12 HOURS Performed at [facility], [place] [address] [place], [place] [address]    Report Status PENDING   CBG monitoring, ED     Status: Abnormal   Collection Time: 05/20/19  8:28 AM  Result Value Ref  Range   Glucose-Capillary 113 (H) 70 - 99 mg/dL    Comment: Glucose reference range applies only to samples taken after fasting for at least 8 hours.  Comprehensive metabolic panel     Status: Abnormal   Collection Time: 05/20/19  9:55 AM  Result Value Ref Range   Sodium 133 (L) 135 - 145 mmol/L   Potassium 4.0 3.5 - 5.1 mmol/L   Chloride 99 98 - 111 mmol/L   CO2 23 22 - 32 mmol/L   Glucose, Bld 165 (H) 70 - 99 mg/dL    Comment: Glucose reference range applies only to samples taken after fasting for at least 8 hours.   BUN 14 6 - 20 mg/dL   Creatinine, Ser 1.20 0.61 - 1.24 mg/dL   Calcium 8.5 (L) 8.9 - 10.3 mg/dL   Total Protein 6.3 (L) 6.5 - 8.1 g/dL   Albumin 2.5 (L) 3.5 - 5.0 g/dL   AST 19 15 - 41 U/L   ALT 25 0 - 44 U/L   Alkaline Phosphatase 119 38 - 126 U/L   Total Bilirubin 0.5 0.3 - 1.2 mg/dL   GFR calc non Af Amer >60 >60 mL/min   GFR calc Af Amer >60 >60 mL/min   Anion gap 11 5 - 15    Comment: Performed at [facility] [address], [place], [place] [address]  Glucose, capillary     Status: Abnormal   Collection Time: 05/20/19 11:29 AM  Result Value Ref Range   Glucose-Capillary 134 (H) 70 - 99 mg/dL    Comment: Glucose reference range applies only to samples taken after fasting for at least 8 hours.  Glucose, capillary     Status: Abnormal   Collection Time: 05/20/19  4:59 PM  Result Value Ref Range   Glucose-Capillary 208 (H) 70 - 99 mg/dL    Comment: Glucose reference range applies only to samples taken after fasting for at least 8 hours.    DG Chest 2 View  Result Date: 05/19/2019 CLINICAL DATA:  Tachycardia, IVDU EXAM: CHEST - 2 VIEW COMPARISON:  Radiograph 05/13/2019 FINDINGS: Low volumes with streaky opacities favoring atelectasis. No consolidative opacity is seen. No pneumothorax or effusion. The cardiomediastinal contours are unremarkable. No acute osseous or soft tissue abnormality. IMPRESSION: No acute cardiopulmonary abnormality. Electronically  Signed   By: Lovena Le M.D.   On: 05/19/2019 21:59    Review of Systems Blood pressure (!) 144/85, pulse 98, temperature 98.1 F (36.7 C), temperature source Oral, resp. rate 18, height 5\' 9"  (1.753 m), weight 90 kg, SpO2 98 %. Physical Exam: right foream with multiple ulcers. Three areas of subcutaneous fluctuation c/w abscesses noted by CT scan. Minimal erythema. Forearm compartments supple. No hand swelling with good grip. Sensation intact.No ascending lymphangitis.  Assessment/Plan: Will need non emergent abscess I&D next several days. Contacted Dr Sharol Given who would be available Luke [name] will evaluate for OR Discussed with patient.Does not need to be NPO tonite-will change in orders Garald Balding 05/20/2019, 5:47 PM

## 2019-05-20 NOTE — Progress Notes (Signed)
Pharmacy Antibiotic Note  Luke [name] is a 39 y.o. male admitted on 05/19/2019 with bacteremia/endocarditis.  Pharmacy has been consulted for Gentamicin dosing.  Pt has recent history of streptococcal mitis/oralis bacteremia/endocarditis for which he was receiving combination therapy with continuous PCN/gentamicin. He also received a dose of Orbactiv on 2/21.  He left AMA on 2/27 without completion of therapy. He now presents back to the ED today. WBC has increased since leaving (14.4>>22.1). Pt is afebrile but tachycardic. Also noted renal dysfunction (1.62>>1.49)  Continuous PCN 2/23>>2/27, 3/3>> Norva Karvonen 2/22>>2/26, 3/3>> Orbactiv x 1 2/21  Plan: -Re-start continuous PCN infusion per MD -Re-start gentamicin 210 mg IV q24h -Watch Scr closely  -Gentamicin trough after a few doses or with significant increase in Scr -F/U repeat blood cultures   Height: 5\' 9"  (175.3 cm) Weight: 198 lb 6.6 oz (90 kg) IBW/kg (Calculated) : 70.7  Temp (24hrs), Avg:98.5 F (36.9 C), Min:98.5 F (36.9 C), Max:98.5 F (36.9 C)  Recent Labs  Lab 05/13/19 0430 05/14/19 0435 05/15/19 0417 05/15/19 1750 05/16/19 0434 05/19/19 2138  WBC 16.5* 14.9* 15.4*  --  14.4* 22.1*  CREATININE 1.25* 1.31* 1.29*  --  1.62* 1.49*  GENTTROUGH  --   --   --  1.3  --   --     Estimated Creatinine Clearance: 73.8 mL/min (A) (by C-G formula based on SCr of 1.49 mg/dL (H)).    No Known Allergies  Narda Bonds, PharmD, BCPS Clinical Pharmacist Phone: [phone]

## 2019-05-21 ENCOUNTER — Other Ambulatory Visit: Payer: Self-pay | Admitting: Physician Assistant

## 2019-05-21 DIAGNOSIS — F1910 Other psychoactive substance abuse, uncomplicated: Secondary | ICD-10-CM

## 2019-05-21 DIAGNOSIS — L02413 Cutaneous abscess of right upper limb: Secondary | ICD-10-CM

## 2019-05-21 LAB — COMPREHENSIVE METABOLIC PANEL
ALT: 25 U/L (ref 0–44)
AST: 20 U/L (ref 15–41)
Albumin: 2.5 g/dL — ABNORMAL LOW (ref 3.5–5.0)
Alkaline Phosphatase: 118 U/L (ref 38–126)
Anion gap: 12 (ref 5–15)
BUN: 8 mg/dL (ref 6–20)
CO2: 22 mmol/L (ref 22–32)
Calcium: 8.6 mg/dL — ABNORMAL LOW (ref 8.9–10.3)
Chloride: 98 mmol/L (ref 98–111)
Creatinine, Ser: 1.28 mg/dL — ABNORMAL HIGH (ref 0.61–1.24)
GFR calc Af Amer: >60 mL/min (ref >60)
GFR calc non Af Amer: >60 mL/min (ref >60)
Glucose, Bld: 292 mg/dL — ABNORMAL HIGH (ref 70–99)
Potassium: 4.7 mmol/L (ref 3.5–5.1)
Sodium: 132 mmol/L — ABNORMAL LOW (ref 135–145)
Total Bilirubin: 0.7 mg/dL (ref 0.3–1.2)
Total Protein: 6.5 g/dL (ref 6.5–8.1)

## 2019-05-21 LAB — CBC
HCT: 29.7 % — ABNORMAL LOW (ref 39.0–52.0)
Hemoglobin: 9.2 g/dL — ABNORMAL LOW (ref 13.0–17.0)
MCH: 22.9 pg — ABNORMAL LOW (ref 26.0–34.0)
MCHC: 31 g/dL (ref 30.0–36.0)
MCV: 74.1 fL — ABNORMAL LOW (ref 80.0–100.0)
Platelets: 544 10*3/uL — ABNORMAL HIGH (ref 150–400)
RBC: 4.01 MIL/uL — ABNORMAL LOW (ref 4.22–5.81)
RDW: 14.7 % (ref 11.5–15.5)
WBC: 16 10*3/uL — ABNORMAL HIGH (ref 4.0–10.5)
nRBC: 0 % (ref 0.0–0.2)

## 2019-05-21 LAB — GLUCOSE, CAPILLARY
Glucose-Capillary: 279 mg/dL — ABNORMAL HIGH (ref 70–99)
Glucose-Capillary: 457 mg/dL — ABNORMAL HIGH (ref 70–99)
Glucose-Capillary: 272 mg/dL — ABNORMAL HIGH (ref 70–99)
Glucose-Capillary: 334 mg/dL — ABNORMAL HIGH (ref 70–99)

## 2019-05-21 MED ORDER — CHLORHEXIDINE GLUCONATE 4 % EX LIQD
60.0000 mL | Freq: Once | CUTANEOUS | Status: AC
  Administered 2019-05-22: 4 via TOPICAL
  Filled 2019-05-21 (×2): qty 60

## 2019-05-21 MED ORDER — CARVEDILOL 3.125 MG PO TABS
3.1250 mg | ORAL_TABLET | Freq: Two times a day (BID) | ORAL | Status: DC
  Administered 2019-05-21 – 2019-05-23 (×4): 3.125 mg via ORAL
  Filled 2019-05-21 (×3): qty 1

## 2019-05-21 MED ORDER — INSULIN GLARGINE 100 UNIT/ML ~~LOC~~ SOLN
30.0000 [IU] | Freq: Two times a day (BID) | SUBCUTANEOUS | Status: DC
  Administered 2019-05-21 – 2019-05-24 (×7): 30 [IU] via SUBCUTANEOUS
  Filled 2019-05-21 (×9): qty 0.3

## 2019-05-21 MED ORDER — INSULIN ASPART 100 UNIT/ML ~~LOC~~ SOLN
3.0000 [IU] | Freq: Three times a day (TID) | SUBCUTANEOUS | Status: DC
  Administered 2019-05-21 – 2019-05-26 (×11): 3 [IU] via SUBCUTANEOUS

## 2019-05-21 MED ORDER — SENNOSIDES-DOCUSATE SODIUM 8.6-50 MG PO TABS
1.0000 | ORAL_TABLET | Freq: Two times a day (BID) | ORAL | Status: DC
  Administered 2019-05-21 – 2019-05-29 (×12): 1 via ORAL
  Filled 2019-05-21 (×16): qty 1

## 2019-05-21 NOTE — Progress Notes (Signed)
Patient for I & D of right arm tomorrow. Luke [name], floor RN notified that patient currently has a peripheral iv in his right hand (operative side) and stated she would attempt to get a new iv placed in his left arm.

## 2019-05-21 NOTE — Consult Note (Signed)
ORTHOPAEDIC CONSULTATION  REQUESTING PHYSICIAN: Florencia Reasons, MD  Chief Complaint: Painful abscesses and ulcers right forearm  HPI: Luke [name] is a 39 y.o. male who presents with Recurrent abscesses right forearm.  Patient is a 39 year old gentleman with uncontrolled diabetes history of heroin use he has had multiple irrigation debridements of his left forearm was admitted this past weekend for debridement of the ulcers of the left forearm and he left AMA.  Patient returns to the hospital at this time for evaluation for treatment of the ulcers and abscesses of the right forearm.  Past Medical History:  Diagnosis Date  . Anemia   . Anxiety  Dx 2008  . Chronic kidney disease   . Diabetes mellitus without complication ([place]) Dx 6004  . DKA (diabetic ketoacidoses) ([place]) 07/21/2017  . GERD (gastroesophageal reflux disease) Dx 2008  . Headache(784.0)   . Hypertension   . Pneumonia   . Seizures ([place]) 2011    x 2 in lifetime. on Dilantin for a while.   . 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 AMPUTATION Left 09/05/2018   Procedure: LEFT GREAT TOE AMPUTATION;  Surgeon: [name], MD;  Location: [facility];  Service: Orthopedics;  Laterality: Left;  . I & D EXTREMITY Left 10/11/2012   Procedure: IRRIGATION AND DEBRIDEMENT ABSCESS FOREARM;  Surgeon: Linna Hoff, MD;  Location: [facility];  Service: Orthopedics;  Laterality: Left;  . I & D EXTREMITY Left 10/12/2012   Procedure: IRRIGATION AND DEBRIDEMENT FOREARM;  Surgeon: Linna Hoff, MD;  Location: [place];  Service: Orthopedics;  Laterality: Left;  . I & D EXTREMITY Left 10/14/2012   Procedure: incision and drainage left forearm;  Surgeon: Linna Hoff, MD;  Location: [facility];  Service: Orthopedics;  Laterality: Left;  . I & D EXTREMITY Left 10/16/2012   Procedure: IRRIGATION AND DEBRIDEMENT LEFT FOREARM;  Surgeon: Linna Hoff, MD;  Location: [facility];  Service:  Orthopedics;  Laterality: Left;  . I & D EXTREMITY Left 10/20/2012   Procedure: INCISION AND DRAINAGE AND DEBRIDEMENT LEFT  FOREARM;  Surgeon: Linna Hoff, MD;  Location: [facility];  Service: Orthopedics;  Laterality: Left;  . INCISION AND DRAINAGE OF WOUND Bilateral 11/29/2017   Procedure: DEBRIDEMENT BILATERAL FEET, DEBRIDEMENT LEFT ANKLE, AND APPLY WOUND VAC;  Surgeon: [name], MD;  Location: [facility];  Service: Orthopedics;  Laterality: Bilateral;  . IRRIGATION AND DEBRIDEMENT ABSCESS     Hx: of left arm abscess related to drug use   . TEE WITHOUT CARDIOVERSION N/A 11/28/2017   Procedure: TRANSESOPHAGEAL ECHOCARDIOGRAM (TEE);  Surgeon: Sanda Klein, MD;  Location: [facility];  Service: Cardiovascular;  Laterality: N/A;  . TEE WITHOUT CARDIOVERSION N/A 05/12/2019   Procedure: TRANSESOPHAGEAL ECHOCARDIOGRAM (TEE);  Surgeon: Skeet Latch, MD;  Location: [facility] ENDOSCOPY;  Service: Cardiovascular;  Laterality: N/A;   Social History   Socioeconomic History  . Marital status: Married    Spouse name: Not on file  . Number of children: 5  . Years of education: some colle  . Highest education level: Not on file  Occupational History  . Occupation: Event set up    Employer: [employer]   . Occupation: Stencil for cars   Tobacco Use  . Smoking status: Current Every Day Smoker    Packs/day: 1.00    Types: Cigarettes  . Smokeless tobacco: Never Used  Substance and Sexual Activity  . Alcohol use: No    Alcohol/week: 0.0 standard drinks    Comment: occasional  . Drug use: Not Currently    Types: Cocaine, IV, Heroin    Comment: IVDU since Dad died in May 05, 2006 with 15 month period of sobriety 2018-2019, relapse Aug 2019.  Marland Kitchen Sexual activity: Yes    Partners: Female    Birth control/protection: None  Other Topics Concern  . Not on file  Social History Narrative   Lives with wife and two children age 5 and 5.          Social Determinants of Health    Financial Resource Strain:   . Difficulty of Paying Living Expenses: Not on file  Food Insecurity:   . Worried About Charity fundraiser in the Last Year: Not on file  . Ran Out of Food in the Last Year: Not on file  Transportation Needs:   . Lack of Transportation (Medical): Not on file  . Lack of Transportation (Non-Medical): Not on file  Physical Activity:   . Days of Exercise per Week: Not on file  . Minutes of Exercise per Session: Not on file  Stress:   . Feeling of Stress : Not on file  Social Connections:   . Frequency of Communication with Friends and Family: Not on file  . Frequency of Social Gatherings with Friends and Family: Not on file  . Attends Religious Services: Not on file  . Active Member of Clubs or Organizations: Not on file  . Attends Archivist Meetings: Not on file  . Marital Status: Not on file   Family History  Problem Relation Age of Onset  . Diabetes Father   . Heart disease Father   . Mental illness Sister   . Cancer Neg Hx    - negative except otherwise stated in the family history section No Known Allergies Prior to Admission medications   Medication Sig Start Date End Date Taking? Authorizing Provider  acetaminophen (TYLENOL) 325 MG tablet Take 2 tablets (650 mg total) by mouth every 6 (six) hours as needed for mild pain (or Fever >/= 101). 12/19/16  Yes Debbe Odea, MD  amLODipine (NORVASC) 5 MG tablet Take 1 tablet (5 mg total) by mouth daily. 04/20/19  Yes Gildardo Pounds, NP  atorvastatin (LIPITOR) 20 MG tablet Take 1 tablet (20 mg total) by mouth daily. 04/20/19  Yes Gildardo Pounds, NP  DULoxetine (CYMBALTA) 30 MG capsule TAKE 1 CAPSULE (30 MG TOTAL) BY MOUTH DAILY. 04/03/19 05/08/28 Yes Charlott Rakes, MD  gabapentin (NEURONTIN) 400 MG capsule Take 1 capsule (400 mg total) by mouth 3 (three) times daily. 04/20/19 05/20/19 Yes Gildardo Pounds, NP  Insulin Glargine (LANTUS SOLOSTAR) 100 UNIT/ML Solostar Pen INJECT 40 UNITS INTO THE  SKIN 2 (TWO) TIMES DAILY. Patient taking differently: Inject 40 Units into the skin 2 (two) times daily.  04/20/19  Yes Gildardo Pounds, NP  insulin lispro (HUMALOG KWIKPEN) 100 UNIT/ML KwikPen Inject 0.04-0.2 mLs (4-20 Units total) into the skin 3 (three) times daily. Per sliding scale 04/20/19  Yes Gildardo Pounds, NP  levothyroxine (SYNTHROID) 50 MCG tablet TAKE 1 TABLET (50 MCG TOTAL) BY MOUTH DAILY. 04/03/19  Yes Charlott Rakes, MD  lisinopril (ZESTRIL) 10 MG tablet Take 1 tablet (10 mg total) by mouth daily. 10/01/18 05/08/28 Yes Gildardo Pounds, NP  Melatonin 5 MG CHEW Chew 5 mg by mouth at bedtime as needed (sleep).   Yes [provider]  omeprazole (PRILOSEC) 20 MG capsule Take 1 capsule (20 mg total) by mouth daily. 04/20/19 07/19/19 Yes Gildardo Pounds, NP  sertraline (ZOLOFT) 50 MG tablet TAKE 1 TABLET (50 MG TOTAL) BY MOUTH DAILY. 12/19/18  Yes Charlott Rakes, MD  Blood Glucose Monitoring Suppl (TRUE METRIX METER) DEVI 1 kit by Does not apply route as directed. Use as directed 06/07/15   Boykin Nearing, MD  glucose blood test strip Use as instructed 04/20/19   Gildardo Pounds, NP  Insulin Syringe-Needle U-100 (TRUEPLUS INSULIN SYRINGE) 31G X 5/16" 0.5 ML MISC USE AS DIRECTED. 04/20/19   Gildardo Pounds, NP  TRUEPLUS LANCETS 28G MISC Use as directed 01/28/17   Alfonse Spruce, FNP   DG Chest 2 View  Result Date: 05/19/2019 CLINICAL DATA:  Tachycardia, IVDU EXAM: CHEST - 2 VIEW COMPARISON:  Radiograph 05/13/2019 FINDINGS: Low volumes with streaky opacities favoring atelectasis. No consolidative opacity is seen. No pneumothorax or effusion. The cardiomediastinal contours are unremarkable. No acute osseous or soft tissue abnormality. IMPRESSION: No acute cardiopulmonary abnormality. Electronically Signed   By: Lovena Le M.D.   On: 05/19/2019 21:59   - pertinent xrays, CT, MRI studies were reviewed and independently interpreted  Positive ROS: All other systems have been reviewed  and were otherwise negative with the exception of those mentioned in the HPI and as above.  Physical Exam: General: Alert, no acute distress Psychiatric: Patient is competent for consent with normal mood and affect Lymphatic: No axillary or cervical lymphadenopathy Cardiovascular: No pedal edema Respiratory: No cyanosis, no use of accessory musculature GI: No organomegaly, abdomen is soft and non-tender    Images:  _0 @  Labs:  Lab Results  Component Value Date   HGBA1C 10.9 (H) 05/20/2019   HGBA1C 10.0 (H) 03/19/2019   HGBA1C 12.7 (A) 10/01/2018   ESRSEDRATE 62 (H) 05/19/2019   ESRSEDRATE 55 (H) 05/16/2019   ESRSEDRATE 55 (H) 03/19/2019   CRP 0.6 05/16/2019   CRP 3.8 (H) 03/19/2019   CRP 9.0 (H) 11/23/2017   REPTSTATUS PENDING 05/20/2019   REPTSTATUS PENDING 05/20/2019   GRAMSTAIN  11/29/2017    FEW WBC PRESENT, PREDOMINANTLY PMN NO ORGANISMS SEEN    CULT  05/20/2019    NO GROWTH < 12 HOURS Performed at [facility], [place] [address] [place], [place] [address]    CULT  05/20/2019    NO GROWTH < 12 HOURS Performed at [facility] [address] [place],  [address]    LABORGA STREPTOCOCCUS MITIS/ORALIS 05/08/2019    Lab Results  Component Value Date   ALBUMIN 2.5 (L) 05/21/2019   ALBUMIN 2.5 (L) 05/20/2019   ALBUMIN 2.4 (L) 05/16/2019    Neurologic: Patient does not have protective sensation bilateral lower extremities.   MUSCULOSKELETAL:   Skin: Examination patient has multiple ulcers there is several areas of fluctuance which are tender to palpation.  There is dermatitis there is necrotic ulcers.  Patient has previous incisions along the ulna.  Patient's hand is neurovascular intact with good range of motion of all fingers.  Review of his CT scan does show some areas of abscess in the forearm primarily along the ulnar border.  Assessment: Assessment: Uncontrolled diabetes with a hemoglobin A1c greater than 10 with severe  protein caloric malnutrition with an albumin of 2.5 with abscesses to the right forearm.  Plan: Plan: We will plan for surgical debridement of the abscesses right forearm will send tissue for cultures.  Plan for surgery  tomorrow on Friday.  N.p.o. after midnight tonight.  Thank you for the consult and the opportunity to see [name], [place] [phone] 8:52 AM

## 2019-05-21 NOTE — Progress Notes (Signed)
PROGRESS NOTE  DETROIT FRIEDEN TJL:[id number] DOB: [birth year] DOA: 05/19/2019 PCP: Gildardo Pounds, NP  Brief history:   BURLEIGH BROCKMANN is a 39 y.o. 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  Patient recently admitted on 2/20 for sepsis secondary to endocarditis and ultimately left AMA on 2/27 prior to completion of IV abx treatment. He reports feeling scared and that is why he left but after talking with family, they convinced him to come back and complete treatment. Denies any complaints currently. He last used heroin yesterday. Reports he did not take his blood pressure or DM medications today but has otherwise been compliant. Denies any pain. Denies problems eating or drinking. Denies fever, chills, cough, SOB, chest pain, abdominal pain, nausea, vomiting, diarrhea, constipation, dysuria, hematuria, hematochezia, melena, difficulty moving arms/legs, speech difficulty, trouble eating, confusion or any other complaints.  In the ED: Tachycardic with elevated blood pressure otherwise afebrile and stable. Labs: K 3.4, glucose 93, Cr 1.49, WBC 22.1, Hgb 9.9, Trop 20>25, ESR 62. ED provider called for admission for continuation of prior IV abx treatment.   HPI/Recap of past 24 hours:  Reports back pain, he is refusing MRI he does not want to stay in the scanner for more than an hour Denies chest pain, no short of breath, no cough Denies arm pain, denies foot pain,  no fever  Assessment/Plan: Principal Problem:   Drug abuse, IV ([name]) Active Problems:   Tobacco abuse   Essential hypertension   Abscess   CKD (chronic kidney disease) stage 3, GFR 30-59 ml/min   Cocaine abuse ([name])   Uncontrolled insulin dependent type 1 diabetes mellitus ([place])   Diabetic polyneuropathy associated with type 1 diabetes mellitus ([name])  Hepatitis C   Hypothyroidism   Sepsis ([place])   Heroin abuse ([place])   Acute bacterial endocarditis   Endocarditis   Abscess of right forearm  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, in setting of IVDU; sepsis present on admission  -Tachycardic on admission with WBC 22.1 -repeat blood culture no growth, WBC 29 -Continue gent and penicillin -Case discussed with infectious disease who will do formal consult  Right forearm abscesses -Ortho consulted, plan for I&D tomorrow in OR  Back pain, awaiting for MRI  Thrombocytosis Likely reactive, repeat CBC   Insulin-dependent type 2 diabetes, uncontrolled -A1c 10.9 - IV antibiotics carrier fluids is dextrose,  a.m. blood glucose to 292, increase Lantus from 20 units twice daily to 30 units twice daily, add on meal coverage ,continue SSI ,continue adjust insulin as needed  CKD 2 anemia of chronic disease -Creatinine and Hgb appear to be at baseline -Monitor   Hypertension, stable on home medication lisinopril and Norvasc  Hyperlipidemia ,continue statin  Hypothyroidism, continue Synthroid  Polysubstance abuse Education provided, patient states he is ready to quit  DVT Prophylaxis: Lovenox  Code Status: Full  Family Communication: patient   Disposition Plan:    Patient came from:                    home  Anticipated d/c place: home   Barriers to d/c OR conditions which need to be met to effect a safe d/c: needs iv abx , h/o ivdu, needs ID and ortho consult, not ready to discharge   Consultants:  Ortho  ID  Procedures:  None  Antibiotics:  Gentamicin and penicillin G   Objective: BP (!) 134/92 (BP Location: Right Arm)   Pulse 98   Temp 98.6 F (37 C) (Oral)   Resp 18   Ht '5\' 9"'$  (1.753 m)   Wt 90 kg   SpO2 98%   BMI 29.30 kg/m   Intake/Output Summary (Last 24 hours) at 05/21/2019  0944 Last data filed at 05/21/2019 0600 Gross per 24 hour  Intake 1243.97 ml  Output 1300 ml  Net -56.03 ml   Filed Weights   05/19/19 2126  Weight: 90 kg    Exam: Patient is examined daily including today on 05/21/2019, exams remain the same as of yesterday except that has changed    General:  NAD  Cardiovascular: RRR  Respiratory: CTABL  Abdomen: Soft/ND/NT, positive BS  Musculoskeletal: multiple necrotic wounds,induration, track marks, nontender, no active drainage, tenderness mid back and low back  Neuro: alert, oriented   Data Reviewed: Basic Metabolic Panel: Recent Labs  Lab 05/15/19 0417 05/16/19 0434 05/19/19 2138 05/20/19 0955 05/21/19 0616  NA 133* 131* 140 133* 132*  K 3.7 4.4 3.4* 4.0 4.7  CL 98 98 106 99 98  CO2 '25 25 22 23 22  '$ GLUCOSE 324* 469* 93 165* 292*  BUN '16 18 17 14 8  '$ CREATININE 1.29* 1.62* 1.49* 1.20 1.28*  CALCIUM 8.7* 8.7* 9.2 8.5* 8.6*  MG 1.7 1.8  --   --   --   PHOS 4.7* 4.5  --   --   --    Liver Function Tests: Recent Labs  Lab 05/15/19 0417 05/16/19 0434 05/20/19 0955 05/21/19 0616  AST '27 23 19 20  '$ ALT '29 28 25 25  '$ ALKPHOS 124 117 119 118  BILITOT 0.1* 0.2* 0.5 0.7  PROT 6.0* 6.0* 6.3* 6.5  ALBUMIN 2.4* 2.4* 2.5* 2.5*   No results for input(s): LIPASE, AMYLASE in the last 168 hours. No results for input(s): AMMONIA in the last 168 hours. CBC: Recent Labs  Lab 05/15/19 0417 05/16/19 0434 05/19/19 2138 05/21/19 0616  WBC 15.4* 14.4* 22.1* 16.0*  NEUTROABS 9.4* 9.1*  --   --   HGB 9.2* 9.0* 9.9* 9.2*  HCT 28.8* 28.5* 31.5* 29.7*  MCV 74.4* 74.8* 75.5* 74.1*  PLT 587* 549* 724* 544*   Cardiac Enzymes:   No results for input(s): CKTOTAL, CKMB, CKMBINDEX, TROPONINI in the last 168 hours. BNP (last 3 results) No results for input(s): BNP in the last 8760 hours.  ProBNP (last 3 results) No results for input(s): PROBNP in the last 8760 hours.  CBG: Recent Labs  Lab 05/20/19 0828 05/20/19 1129 05/20/19 1659  05/20/19 2141 05/21/19 0607  GLUCAP 113* 134* 208* 91 279*    Recent Results (from the past 240 hour(s))  MRSA PCR Screening     Status: None   Collection Time: 05/11/19  5:25 PM   Specimen: Nasal Mucosa; Nasopharyngeal  Result Value Ref Range Status   MRSA by PCR NEGATIVE NEGATIVE Final    Comment:        The GeneXpert MRSA Assay (FDA approved for NASAL specimens only), is one component of a comprehensive MRSA colonization surveillance program. It is not intended to diagnose MRSA infection nor to  guide or monitor treatment for MRSA infections. Performed at [facility], [place] [address], [place], [place] [address]   Culture, blood (routine x 2)     Status: None (Preliminary result)   Collection Time: 05/20/19  8:10 AM   Specimen: BLOOD RIGHT ARM  Result Value Ref Range Status   Specimen Description BLOOD RIGHT ARM  Final   Special Requests   Final    BOTTLES DRAWN AEROBIC ONLY Blood Culture results may not be optimal due to an inadequate volume of blood received in culture bottles   Culture   Final    NO GROWTH 1 DAY Performed at [facility], [place] [address], [place], [place] [address]    Report Status PENDING  Incomplete  Culture, blood (routine x 2)     Status: None (Preliminary result)   Collection Time: 05/20/19  8:10 AM   Specimen: BLOOD RIGHT ARM  Result Value Ref Range Status   Specimen Description BLOOD RIGHT ARM  Final   Special Requests   Final    BOTTLES DRAWN AEROBIC ONLY Blood Culture results may not be optimal due to an inadequate volume of blood received in culture bottles   Culture   Final    NO GROWTH 1 DAY Performed at [facility], [place] [address], [place], [place] [address]    Report Status PENDING  Incomplete     Studies: No results found.  Scheduled Meds: . amLODipine  5 mg Oral Daily  . atorvastatin  20 mg Oral Daily  . DULoxetine  30 mg Oral Daily  . enoxaparin (LOVENOX) injection  40 mg Subcutaneous Daily  . gabapentin   400 mg Oral TID  . insulin aspart  0-15 Units Subcutaneous TID WC  . insulin glargine  30 Units Subcutaneous BID  . levothyroxine  50 mcg Oral Daily  . lisinopril  10 mg Oral Daily  . pantoprazole  40 mg Oral Daily  . senna-docusate  1 tablet Oral BID  . sertraline  50 mg Oral Daily    Continuous Infusions: . gentamicin 210 mg (05/21/19 0112)  . penicillin g continuous IV infusion 41.7 mL/hr at 05/21/19 0600     Time spent: 77mns I have personally reviewed and interpreted on  05/21/2019 daily labs, tele strips, imagings as discussed above under date review session and assessment and plans.  I reviewed all nursing notes, pharmacy notes, consultant notes,  vitals, pertinent old records  I have discussed plan of care as described above with RN , patient  on 05/21/2019   FFlorencia ReasonsMD, PhD, FACP  [facility]  Available via Epic secure chat 7am-7pm for nonurgent issues Please page for urgent issues, pager number available through [email or web address] .   05/21/2019, 9:44 AM  LOS: 1 day

## 2019-05-21 NOTE — H&P (View-Only) (Signed)
ORTHOPAEDIC CONSULTATION  REQUESTING PHYSICIAN: Florencia Reasons, MD  Chief Complaint: Painful abscesses and ulcers right forearm  HPI: Luke [name] is a 39 y.o. male who presents with Recurrent abscesses right forearm.  Patient is a 39 year old gentleman with uncontrolled diabetes history of heroin use he has had multiple irrigation debridements of his left forearm was admitted this past weekend for debridement of the ulcers of the left forearm and he left AMA.  Patient returns to the hospital at this time for evaluation for treatment of the ulcers and abscesses of the right forearm.  Past Medical History:  Diagnosis Date  . Anemia   . Anxiety  Dx 2008  . Chronic kidney disease   . Diabetes mellitus without complication ([place]) Dx 6004  . DKA (diabetic ketoacidoses) ([place]) 07/21/2017  . GERD (gastroesophageal reflux disease) Dx 2008  . Headache(784.0)   . Hypertension   . Pneumonia   . Seizures ([place]) 2011    x 2 in lifetime. on Dilantin for a while.   . 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 AMPUTATION Left 09/05/2018   Procedure: LEFT GREAT TOE AMPUTATION;  Surgeon: [name], MD;  Location: [facility];  Service: Orthopedics;  Laterality: Left;  . I & D EXTREMITY Left 10/11/2012   Procedure: IRRIGATION AND DEBRIDEMENT ABSCESS FOREARM;  Surgeon: Linna Hoff, MD;  Location: [facility];  Service: Orthopedics;  Laterality: Left;  . I & D EXTREMITY Left 10/12/2012   Procedure: IRRIGATION AND DEBRIDEMENT FOREARM;  Surgeon: Linna Hoff, MD;  Location: [facility];  Service: Orthopedics;  Laterality: Left;  . I & D EXTREMITY Left 10/14/2012   Procedure: incision and drainage left forearm;  Surgeon: Linna Hoff, MD;  Location: [facility];  Service: Orthopedics;  Laterality: Left;  . I & D EXTREMITY Left 10/16/2012   Procedure: IRRIGATION AND DEBRIDEMENT LEFT FOREARM;  Surgeon: Linna Hoff, MD;  Location: [facility];  Service:  Orthopedics;  Laterality: Left;  . I & D EXTREMITY Left 10/20/2012   Procedure: INCISION AND DRAINAGE AND DEBRIDEMENT LEFT  FOREARM;  Surgeon: Linna Hoff, MD;  Location: [facility];  Service: Orthopedics;  Laterality: Left;  . INCISION AND DRAINAGE OF WOUND Bilateral 11/29/2017   Procedure: DEBRIDEMENT BILATERAL FEET, DEBRIDEMENT LEFT ANKLE, AND APPLY WOUND VAC;  Surgeon: [name], MD;  Location: [facility];  Service: Orthopedics;  Laterality: Bilateral;  . IRRIGATION AND DEBRIDEMENT ABSCESS     Hx: of left arm abscess related to drug use   . TEE WITHOUT CARDIOVERSION N/A 11/28/2017   Procedure: TRANSESOPHAGEAL ECHOCARDIOGRAM (TEE);  Surgeon: Sanda Klein, MD;  Location: [facility];  Service: Cardiovascular;  Laterality: N/A;  . TEE WITHOUT CARDIOVERSION N/A 05/12/2019   Procedure: TRANSESOPHAGEAL ECHOCARDIOGRAM (TEE);  Surgeon: Skeet Latch, MD;  [facility] ENDOSCOPY;  Service: Cardiovascular;  Laterality: N/A;   Social History   Socioeconomic History  . Marital status: Married    Spouse name: Not on file  . Number of children: 5  . Years of education: some colle  . Highest education level: Not on file  Occupational History  . Occupation: Event set up    Employer: [employer]   . Occupation: Stencil for cars   Tobacco Use  . Smoking status: Current Every Day Smoker    Packs/day: 1.00    Types: Cigarettes  . Smokeless tobacco: Never Used  Substance and Sexual Activity  . Alcohol use: No    Alcohol/week: 0.0 standard drinks    Comment: occasional  . Drug use: Not Currently    Types: Cocaine, IV, Heroin    Comment: IVDU since Dad died in May 05, 2006 with 15 month period of sobriety 2018-2019, relapse Aug 2019.  Marland Kitchen Sexual activity: Yes    Partners: Female    Birth control/protection: None  Other Topics Concern  . Not on file  Social History Narrative   Lives with wife and two children age 5 and 5.          Social Determinants of Health    Financial Resource Strain:   . Difficulty of Paying Living Expenses: Not on file  Food Insecurity:   . Worried About Charity fundraiser in the Last Year: Not on file  . Ran Out of Food in the Last Year: Not on file  Transportation Needs:   . Lack of Transportation (Medical): Not on file  . Lack of Transportation (Non-Medical): Not on file  Physical Activity:   . Days of Exercise per Week: Not on file  . Minutes of Exercise per Session: Not on file  Stress:   . Feeling of Stress : Not on file  Social Connections:   . Frequency of Communication with Friends and Family: Not on file  . Frequency of Social Gatherings with Friends and Family: Not on file  . Attends Religious Services: Not on file  . Active Member of Clubs or Organizations: Not on file  . Attends Archivist Meetings: Not on file  . Marital Status: Not on file   Family History  Problem Relation Age of Onset  . Diabetes Father   . Heart disease Father   . Mental illness Sister   . Cancer Neg Hx    - negative except otherwise stated in the family history section No Known Allergies Prior to Admission medications   Medication Sig Start Date End Date Taking? Authorizing Provider  acetaminophen (TYLENOL) 325 MG tablet Take 2 tablets (650 mg total) by mouth every 6 (six) hours as needed for mild pain (or Fever >/= 101). 12/19/16  Yes Debbe Odea, MD  amLODipine (NORVASC) 5 MG tablet Take 1 tablet (5 mg total) by mouth daily. 04/20/19  Yes Gildardo Pounds, NP  atorvastatin (LIPITOR) 20 MG tablet Take 1 tablet (20 mg total) by mouth daily. 04/20/19  Yes Gildardo Pounds, NP  DULoxetine (CYMBALTA) 30 MG capsule TAKE 1 CAPSULE (30 MG TOTAL) BY MOUTH DAILY. 04/03/19 05/08/28 Yes Charlott Rakes, MD  gabapentin (NEURONTIN) 400 MG capsule Take 1 capsule (400 mg total) by mouth 3 (three) times daily. 04/20/19 05/20/19 Yes Gildardo Pounds, NP  Insulin Glargine (LANTUS SOLOSTAR) 100 UNIT/ML Solostar Pen INJECT 40 UNITS INTO THE  SKIN 2 (TWO) TIMES DAILY. Patient taking differently: Inject 40 Units into the skin 2 (two) times daily.  04/20/19  Yes Gildardo Pounds, NP  insulin lispro (HUMALOG KWIKPEN) 100 UNIT/ML KwikPen Inject 0.04-0.2 mLs (4-20 Units total) into the skin 3 (three) times daily. Per sliding scale 04/20/19  Yes Gildardo Pounds, NP  levothyroxine (SYNTHROID) 50 MCG tablet TAKE 1 TABLET (50 MCG TOTAL) BY MOUTH DAILY. 04/03/19  Yes Charlott Rakes, MD  lisinopril (ZESTRIL) 10 MG tablet Take 1 tablet (10 mg total) by mouth daily. 10/01/18 05/08/28 Yes Gildardo Pounds, NP  Melatonin 5 MG CHEW Chew 5 mg by mouth at bedtime as needed (sleep).   Yes [provider]  omeprazole (PRILOSEC) 20 MG capsule Take 1 capsule (20 mg total) by mouth daily. 04/20/19 07/19/19 Yes Gildardo Pounds, NP  sertraline (ZOLOFT) 50 MG tablet TAKE 1 TABLET (50 MG TOTAL) BY MOUTH DAILY. 12/19/18  Yes Charlott Rakes, MD  Blood Glucose Monitoring Suppl (TRUE METRIX METER) DEVI 1 kit by Does not apply route as directed. Use as directed 06/07/15   Boykin Nearing, MD  glucose blood test strip Use as instructed 04/20/19   Gildardo Pounds, NP  Insulin Syringe-Needle U-100 (TRUEPLUS INSULIN SYRINGE) 31G X 5/16" 0.5 ML MISC USE AS DIRECTED. 04/20/19   Gildardo Pounds, NP  TRUEPLUS LANCETS 28G MISC Use as directed 01/28/17   Alfonse Spruce, FNP   DG Chest 2 View  Result Date: 05/19/2019 CLINICAL DATA:  Tachycardia, IVDU EXAM: CHEST - 2 VIEW COMPARISON:  Radiograph 05/13/2019 FINDINGS: Low volumes with streaky opacities favoring atelectasis. No consolidative opacity is seen. No pneumothorax or effusion. The cardiomediastinal contours are unremarkable. No acute osseous or soft tissue abnormality. IMPRESSION: No acute cardiopulmonary abnormality. Electronically Signed   By: Lovena Le M.D.   On: 05/19/2019 21:59   - pertinent xrays, CT, MRI studies were reviewed and independently interpreted  Positive ROS: All other systems have been reviewed  and were otherwise negative with the exception of those mentioned in the HPI and as above.  Physical Exam: General: Alert, no acute distress Psychiatric: Patient is competent for consent with normal mood and affect Lymphatic: No axillary or cervical lymphadenopathy Cardiovascular: No pedal edema Respiratory: No cyanosis, no use of accessory musculature GI: No organomegaly, abdomen is soft and non-tender    Images:  _0 @  Labs:  Lab Results  Component Value Date   HGBA1C 10.9 (H) 05/20/2019   HGBA1C 10.0 (H) 03/19/2019   HGBA1C 12.7 (A) 10/01/2018   ESRSEDRATE 62 (H) 05/19/2019   ESRSEDRATE 55 (H) 05/16/2019   ESRSEDRATE 55 (H) 03/19/2019   CRP 0.6 05/16/2019   CRP 3.8 (H) 03/19/2019   CRP 9.0 (H) 11/23/2017   REPTSTATUS PENDING 05/20/2019   REPTSTATUS PENDING 05/20/2019   GRAMSTAIN  11/29/2017    FEW WBC PRESENT, PREDOMINANTLY PMN NO ORGANISMS SEEN    CULT  05/20/2019    NO GROWTH < 12 HOURS Performed at [facility], [place] [address] [place], [place] [address]    CULT  05/20/2019    NO GROWTH < 12 HOURS Performed at [place] [address] [place],  [address]    LABORGA STREPTOCOCCUS MITIS/ORALIS 05/08/2019    Lab Results  Component Value Date   ALBUMIN 2.5 (L) 05/21/2019   ALBUMIN 2.5 (L) 05/20/2019   ALBUMIN 2.4 (L) 05/16/2019    Neurologic: Patient does not have protective sensation bilateral lower extremities.   MUSCULOSKELETAL:   Skin: Examination patient has multiple ulcers there is several areas of fluctuance which are tender to palpation.  There is dermatitis there is necrotic ulcers.  Patient has previous incisions along the ulna.  Patient's hand is neurovascular intact with good range of motion of all fingers.  Review of his CT scan does show some areas of abscess in the forearm primarily along the ulnar border.  Assessment: Assessment: Uncontrolled diabetes with a hemoglobin A1c greater than 10 with severe  protein caloric malnutrition with an albumin of 2.5 with abscesses to the right forearm.  Plan: Plan: We will plan for surgical debridement of the abscesses right forearm will send tissue for cultures.  Plan for surgery  tomorrow on Friday.  N.p.o. after midnight tonight.  Thank you for the consult and the opportunity to see Mr. Awesome Jared, [place] [phone] 8:52 AM

## 2019-05-21 NOTE — Consult Note (Signed)
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 male with history of ivdu who recently left AMA on 2/27 for being treated for MV endocarditis with penicillin and gentamicin. He was encouraged by his mother and girlfriend who are RNs to return. He denies any further chills, nightsweats, and fevers.    Past Medical History:  Diagnosis Date  . Anemia   . Anxiety  Dx 06/21/06  . Chronic kidney disease   . Diabetes mellitus without complication ([place]) Dx [id number]  . DKA (diabetic ketoacidoses) ([place]) 07/21/2017  . GERD (gastroesophageal reflux disease) Dx 06-21-2006  . Headache(784.0)   . Hypertension   . Pneumonia   . Seizures ([place]) 2009-06-20    x 2 in lifetime. on Dilantin for a while.   . Substance abuse ([place]) 2011/06/21    heroin use, multiple relapses    Allergies: No Known Allergies  Current antibiotics:   MEDICATIONS: . amLODipine  5 mg Oral Daily  . atorvastatin  20 mg Oral Daily  . DULoxetine  30 mg Oral Daily  . enoxaparin (LOVENOX) injection  40 mg Subcutaneous Daily  . gabapentin  400 mg Oral TID  . insulin aspart  0-15 Units Subcutaneous TID WC  . insulin aspart  3 Units Subcutaneous TID WC  . insulin glargine  30 Units Subcutaneous BID  . levothyroxine  50 mcg Oral Daily  . lisinopril  10 mg Oral Daily  . pantoprazole  40 mg Oral Daily  . senna-docusate  1 tablet Oral BID  .  sertraline  50 mg Oral Daily    Social History   Tobacco Use  . Smoking status: Current Every Day Smoker    Packs/day: 1.00    Types: Cigarettes  . Smokeless tobacco: Never Used  Substance Use Topics  . Alcohol use: No    Alcohol/week: 0.0 standard drinks    Comment: occasional  . Drug use: Not Currently    Types: Cocaine, IV, Heroin    Comment: IVDU since Dad died in 2006/06/21 with 15 month period of sobriety 2018-2019, relapse Aug 2019.    Family History  Problem Relation Age of Onset  . Diabetes Father   . Heart disease Father   . Mental illness Sister   . Cancer Neg Hx     Review of Systems  Constitutional: Negative for fever, chills, diaphoresis, activity change, appetite change, fatigue and unexpected weight change.  HENT: Negative for congestion, sore throat, rhinorrhea, sneezing, trouble swallowing and sinus pressure.  Eyes: Negative for photophobia and visual disturbance.  Respiratory: Negative for cough, chest tightness, shortness of breath, wheezing and stridor.  Cardiovascular: Negative for chest pain, palpitations and leg swelling.  Gastrointestinal: Negative for nausea, vomiting, abdominal pain, diarrhea, constipation, blood in stool, abdominal distention and anal bleeding.  Genitourinary: Negative for dysuria, hematuria, flank  pain and difficulty urinating.  Musculoskeletal: Negative for myalgias, back pain, joint swelling, arthralgias and gait problem.  Skin: Negative for color change, pallor, rash and wound.  Neurological: Negative for dizziness, tremors, weakness and light-headedness.  Hematological: Negative for adenopathy. Does not bruise/bleed easily.  Psychiatric/Behavioral: Negative for behavioral problems, confusion, sleep disturbance, dysphoric mood, decreased concentration and agitation.     OBJECTIVE: Temp:  [98.1 F (36.7 C)-98.7 F (37.1 C)] 98.7 F (37.1 C) (03/04 1130) Pulse Rate:  [98-110] 110 (03/04 1130) Resp:  [15-18] 15 (03/04 1130) BP:  (134-148)/(79-92) 137/79 (03/04 1130) SpO2:  [98 %-100 %] 98 % (03/04 1130) Physical Exam  Constitutional: He is oriented to person, place, and time. He appears well-developed and well-nourished. No distress.  HENT:  Mouth/Throat: Oropharynx is clear and moist. No oropharyngeal exudate.  Cardiovascular: Normal rate, regular rhythm and normal heart sounds. Exam reveals no gallop and no friction rub.  No murmur heard.  Pulmonary/Chest: Effort normal and breath sounds normal. No respiratory distress. He has no wheezes.  Abdominal: Soft. Bowel sounds are normal. He exhibits no distension. There is no tenderness.  Lymphadenopathy:  He has no cervical adenopathy.  Ext: numerous eschar /open wound to right forearm. Swollen, erythema. 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  Skin: Skin is warm and dry. No rash noted. No erythema.  Psychiatric: He has a normal mood and affect. His behavior is normal.     LABS: Results for orders placed or performed during the hospital encounter of 05/19/19 (from the past 48 hour(s))  Basic metabolic panel     Status: Abnormal   Collection Time: 05/19/19  9:38 PM  Result Value Ref Range   Sodium 140 135 - 145 mmol/L   Potassium 3.4 (L) 3.5 - 5.1 mmol/L   Chloride 106 98 - 111 mmol/L   CO2 22 22 - 32 mmol/L   Glucose, Bld 93 70 - 99 mg/dL    Comment: Glucose reference range applies only to samples taken after fasting for at least 8 hours.   BUN 17 6 - 20 mg/dL   Creatinine, Ser 1.49 (H) 0.61 - 1.24 mg/dL   Calcium 9.2 8.9 - 10.3 mg/dL   GFR calc non Af Amer 58 (L) >60 mL/min   GFR calc Af Amer >60 >60 mL/min   Anion gap 12 5 - 15    Comment: Performed at [facility] [address], [place], [place] [address]  CBC     Status: Abnormal   Collection Time: 05/19/19  9:38 PM  Result Value Ref Range   WBC 22.1 (H) 4.0 - 10.5 K/uL   RBC 4.17 (L) 4.22 - 5.81 MIL/uL   Hemoglobin 9.9 (L) 13.0 - 17.0  g/dL   HCT 31.5 (L) 39.0 - 52.0 %   MCV 75.5 (L) 80.0 - 100.0 fL   MCH 23.7 (L) 26.0 - 34.0 pg   MCHC 31.4 30.0 - 36.0 g/dL   RDW 14.9 11.5 - 15.5 %   Platelets 724 (H) 150 - 400 K/uL   nRBC 0.0 0.0 - 0.2 %    Comment: Performed at [facility], [place] [address] [place], [place] [address]  Troponin I (High Sensitivity)     Status: Abnormal   Collection Time: 05/19/19  9:38 PM  Result Value Ref Range   Troponin I (High Sensitivity) 20 (H) <18 ng/L    Comment: (NOTE) Elevated high sensitivity troponin I (hsTnI) values  and significant  changes across serial measurements may suggest ACS but many other  chronic and acute conditions are known to elevate hsTnI results.  Refer to the "Links" section for chest pain algorithms and additional  guidance. Performed at [facility], [place] [address] [place], [place] [address]   Protime-INR (order if Patient is taking Coumadin / Warfarin)     Status: None   Collection Time: 05/19/19  9:38 PM  Result Value Ref Range   Prothrombin Time 12.4 11.4 - 15.2 seconds   INR 0.9 0.8 - 1.2    Comment: (NOTE) INR goal varies based on device and disease states. Performed at [facility], [place] [address], [place], [place] [address]   Troponin I (High Sensitivity)     Status: Abnormal   Collection Time: 05/19/19  9:38 PM  Result Value Ref Range   Troponin I (High Sensitivity) 25 (H) <18 ng/L    Comment: (NOTE) Elevated high sensitivity troponin I (hsTnI) values and significant  changes across serial measurements may suggest ACS but many other  chronic and acute conditions are known to elevate hsTnI results.  Refer to the "Links" section for chest pain algorithms and additional  guidance. Performed at [facility], [place] [address], , [place] [address]   Sedimentation rate     Status: Abnormal   Collection Time: 05/19/19  9:38 PM  Result Value Ref Range   Sed Rate 62 (H) 0 - 16 mm/hr    Comment: Performed at [facility] [address], [place], [place] [address]  Hemoglobin A1c     Status: Abnormal   Collection Time: 05/20/19  1:30 AM  Result Value Ref Range   Hgb A1c MFr Bld 10.9 (H) 4.8 - 5.6 %    Comment: (NOTE) Pre diabetes:          5.7%-6.4% Diabetes:              >6.4% Glycemic control for   <7.0% adults with diabetes    Mean Plasma Glucose 266.13 mg/dL    Comment: Performed at [facility] [address] [place], [place] [address]  Lactic acid, plasma     Status: None   Collection Time: 05/20/19  2:08 AM  Result Value Ref Range   Lactic Acid, Venous 1.0 0.5 - 1.9 mmol/L    Comment: Performed at [facility] [address] [place], [place] [address]  Culture, blood (routine x 2)     Status: None (Preliminary result)   Collection Time: 05/20/19  8:10 AM   Specimen: BLOOD RIGHT ARM  Result Value Ref Range   Specimen Description BLOOD RIGHT ARM    Special Requests      BOTTLES DRAWN AEROBIC ONLY Blood Culture results may not be optimal due to an inadequate volume of blood received in culture bottles   Culture      NO GROWTH 1 DAY Performed at [facility], [place] [address] [place], [place] [address]    Report Status PENDING   Culture, blood (routine x 2)     Status: None (Preliminary result)   Collection Time: 05/20/19  8:10 AM   Specimen: BLOOD RIGHT ARM  Result Value Ref Range   Specimen Description BLOOD RIGHT ARM    Special Requests      BOTTLES DRAWN AEROBIC ONLY Blood Culture results may not be optimal due to an inadequate volume of blood received in culture bottles  Culture      NO GROWTH 1 DAY Performed at [facility], [place] [address] [place], [place] [address]    Report Status PENDING   CBG monitoring, ED     Status: Abnormal   Collection Time: 05/20/19  8:28 AM  Result Value Ref Range   Glucose-Capillary 113 (H) 70 - 99 mg/dL    Comment: Glucose reference range applies only to samples taken after fasting for at least 8 hours.    Comprehensive metabolic panel     Status: Abnormal   Collection Time: 05/20/19  9:55 AM  Result Value Ref Range   Sodium 133 (L) 135 - 145 mmol/L   Potassium 4.0 3.5 - 5.1 mmol/L   Chloride 99 98 - 111 mmol/L   CO2 23 22 - 32 mmol/L   Glucose, Bld 165 (H) 70 - 99 mg/dL    Comment: Glucose reference range applies only to samples taken after fasting for at least 8 hours.   BUN 14 6 - 20 mg/dL   Creatinine, Ser 1.20 0.61 - 1.24 mg/dL   Calcium 8.5 (L) 8.9 - 10.3 mg/dL   Total Protein 6.3 (L) 6.5 - 8.1 g/dL   Albumin 2.5 (L) 3.5 - 5.0 g/dL   AST 19 15 - 41 U/L   ALT 25 0 - 44 U/L   Alkaline Phosphatase 119 38 - 126 U/L   Total Bilirubin 0.5 0.3 - 1.2 mg/dL   GFR calc non Af Amer >60 >60 mL/min   GFR calc Af Amer >60 >60 mL/min   Anion gap 11 5 - 15    Comment: Performed at [facility] [address], [place], [place] [address]  Glucose, capillary     Status: Abnormal   Collection Time: 05/20/19 11:29 AM  Result Value Ref Range   Glucose-Capillary 134 (H) 70 - 99 mg/dL    Comment: Glucose reference range applies only to samples taken after fasting for at least 8 hours.  Glucose, capillary     Status: Abnormal   Collection Time: 05/20/19  4:59 PM  Result Value Ref Range   Glucose-Capillary 208 (H) 70 - 99 mg/dL    Comment: Glucose reference range applies only to samples taken after fasting for at least 8 hours.  Glucose, capillary     Status: None   Collection Time: 05/20/19  9:41 PM  Result Value Ref Range   Glucose-Capillary 91 70 - 99 mg/dL    Comment: Glucose reference range applies only to samples taken after fasting for at least 8 hours.  Glucose, capillary     Status: Abnormal   Collection Time: 05/21/19  6:07 AM  Result Value Ref Range   Glucose-Capillary 279 (H) 70 - 99 mg/dL    Comment: Glucose reference range applies only to samples taken after fasting for at least 8 hours.  Comprehensive metabolic panel     Status: Abnormal   Collection Time: 05/21/19  6:16  AM  Result Value Ref Range   Sodium 132 (L) 135 - 145 mmol/L   Potassium 4.7 3.5 - 5.1 mmol/L   Chloride 98 98 - 111 mmol/L   CO2 22 22 - 32 mmol/L   Glucose, Bld 292 (H) 70 - 99 mg/dL    Comment: Glucose reference range applies only to samples taken after fasting for at least 8 hours.   BUN 8 6 - 20 mg/dL   Creatinine, Ser 1.28 (H) 0.61 - 1.24 mg/dL   Calcium 8.6 (L) 8.9 -  10.3 mg/dL   Total Protein 6.5 6.5 - 8.1 g/dL   Albumin 2.5 (L) 3.5 - 5.0 g/dL   AST 20 15 - 41 U/L   ALT 25 0 - 44 U/L   Alkaline Phosphatase 118 38 - 126 U/L   Total Bilirubin 0.7 0.3 - 1.2 mg/dL   GFR calc non Af Amer >60 >60 mL/min   GFR calc Af Amer >60 >60 mL/min   Anion gap 12 5 - 15    Comment: Performed at [facility] [address], [place], [place] [address]  CBC     Status: Abnormal   Collection Time: 05/21/19  6:16 AM  Result Value Ref Range   WBC 16.0 (H) 4.0 - 10.5 K/uL   RBC 4.01 (L) 4.22 - 5.81 MIL/uL   Hemoglobin 9.2 (L) 13.0 - 17.0 g/dL   HCT 29.7 (L) 39.0 - 52.0 %   MCV 74.1 (L) 80.0 - 100.0 fL   MCH 22.9 (L) 26.0 - 34.0 pg   MCHC 31.0 30.0 - 36.0 g/dL   RDW 14.7 11.5 - 15.5 %   Platelets 544 (H) 150 - 400 K/uL   nRBC 0.0 0.0 - 0.2 %    Comment: Performed at [facility] [address] [place], [place] [address]  Glucose, capillary     Status: Abnormal   Collection Time: 05/21/19 10:48 AM  Result Value Ref Range   Glucose-Capillary 457 (H) 70 - 99 mg/dL    Comment: Glucose reference range applies only to samples taken after fasting for at least 8 hours.   Comment 1 Notify RN    Comment 2 Document in Chart     MICRO: pending IMAGING: DG Chest 2 View  Result Date: 05/19/2019 CLINICAL DATA:  Tachycardia, IVDU EXAM: CHEST - 2 VIEW COMPARISON:  Radiograph 05/13/2019 FINDINGS: Low volumes with streaky opacities favoring atelectasis. No consolidative opacity is seen. No pneumothorax or effusion. The cardiomediastinal contours are unremarkable. No acute osseous or soft  tissue abnormality. IMPRESSION: No acute cardiopulmonary abnormality. Electronically Signed   By: Lovena Le M.D.   On: 05/19/2019 21:59    HISTORICAL MICRO/IMAGING  Assessment/Plan:  39yo M with streptococcal mv endocarditis and right arm deep tissue infection  - plan to continue with 14 day course of penicillin plus gentamicin to treat endocarditis - plan for I x D tomorrow to have source control of right arm - encouraged patient the need to stay full 14 day course of treatment  Hx of drug use = may need to watch for withdrawal.

## 2019-05-22 ENCOUNTER — Encounter (HOSPITAL_COMMUNITY)
Admission: EM | Disposition: A | Discharge status: Home / Self Care | Payer: Self-pay | Source: Home / Self Care | Attending: Internal Medicine

## 2019-05-22 ENCOUNTER — Encounter (HOSPITAL_COMMUNITY): Payer: Self-pay | Admitting: Family Medicine

## 2019-05-22 ENCOUNTER — Inpatient Hospital Stay (HOSPITAL_COMMUNITY): Payer: Commercial Managed Care - PPO

## 2019-05-22 ENCOUNTER — Inpatient Hospital Stay (HOSPITAL_COMMUNITY): Payer: Commercial Managed Care - PPO | Admitting: Certified Registered Nurse Anesthetist

## 2019-05-22 HISTORY — PX: I & D EXTREMITY: SHX5045

## 2019-05-22 LAB — URINALYSIS, ROUTINE W REFLEX MICROSCOPIC
Bilirubin Urine: NEGATIVE
Glucose, UA: >=500 mg/dL — AB
Ketones, ur: NEGATIVE mg/dL
Leukocytes,Ua: NEGATIVE
Nitrite: NEGATIVE
Protein, ur: 100 mg/dL — AB
Specific Gravity, Urine: 1.029 (ref 1.005–1.030)
pH: 5 (ref 5.0–8.0)

## 2019-05-22 LAB — COMPREHENSIVE METABOLIC PANEL
ALT: 25 U/L (ref 0–44)
AST: 24 U/L (ref 15–41)
Albumin: 2.6 g/dL — ABNORMAL LOW (ref 3.5–5.0)
Alkaline Phosphatase: 120 U/L (ref 38–126)
Anion gap: 10 (ref 5–15)
BUN: 12 mg/dL (ref 6–20)
CO2: 21 mmol/L — ABNORMAL LOW (ref 22–32)
Calcium: 8.9 mg/dL (ref 8.9–10.3)
Chloride: 98 mmol/L (ref 98–111)
Creatinine, Ser: 1.51 mg/dL — ABNORMAL HIGH (ref 0.61–1.24)
GFR calc Af Amer: >60 mL/min (ref >60)
GFR calc non Af Amer: 57 mL/min — ABNORMAL LOW (ref >60)
Glucose, Bld: 495 mg/dL — ABNORMAL HIGH (ref 70–99)
Potassium: 4.7 mmol/L (ref 3.5–5.1)
Sodium: 129 mmol/L — ABNORMAL LOW (ref 135–145)
Total Bilirubin: 0.5 mg/dL (ref 0.3–1.2)
Total Protein: 6.5 g/dL (ref 6.5–8.1)

## 2019-05-22 LAB — GLUCOSE, CAPILLARY
Glucose-Capillary: 461 mg/dL — ABNORMAL HIGH (ref 70–99)
Glucose-Capillary: 370 mg/dL — ABNORMAL HIGH (ref 70–99)
Glucose-Capillary: 193 mg/dL — ABNORMAL HIGH (ref 70–99)
Glucose-Capillary: 146 mg/dL — ABNORMAL HIGH (ref 70–99)
Glucose-Capillary: 49 mg/dL — ABNORMAL LOW (ref 70–99)
Glucose-Capillary: 72 mg/dL (ref 70–99)
Glucose-Capillary: 120 mg/dL — ABNORMAL HIGH (ref 70–99)
Glucose-Capillary: 391 mg/dL — ABNORMAL HIGH (ref 70–99)

## 2019-05-22 LAB — CBC
HCT: 29.7 % — ABNORMAL LOW (ref 39.0–52.0)
Hemoglobin: 9.4 g/dL — ABNORMAL LOW (ref 13.0–17.0)
MCH: 23.4 pg — ABNORMAL LOW (ref 26.0–34.0)
MCHC: 31.6 g/dL (ref 30.0–36.0)
MCV: 74.1 fL — ABNORMAL LOW (ref 80.0–100.0)
Platelets: 521 10*3/uL — ABNORMAL HIGH (ref 150–400)
RBC: 4.01 MIL/uL — ABNORMAL LOW (ref 4.22–5.81)
RDW: 14.6 % (ref 11.5–15.5)
WBC: 15 10*3/uL — ABNORMAL HIGH (ref 4.0–10.5)
nRBC: 0 % (ref 0.0–0.2)

## 2019-05-22 LAB — MRSA PCR SCREENING: MRSA by PCR: NEGATIVE

## 2019-05-22 SURGERY — IRRIGATION AND DEBRIDEMENT EXTREMITY
Anesthesia: General | Laterality: Right

## 2019-05-22 MED ORDER — CHLORHEXIDINE GLUCONATE CLOTH 2 % EX PADS
6.0000 | MEDICATED_PAD | Freq: Every day | CUTANEOUS | Status: DC
  Administered 2019-05-22 – 2019-05-28 (×7): 6 via TOPICAL

## 2019-05-22 MED ORDER — LACTATED RINGERS IV SOLN: INTRAVENOUS | Status: DC | PRN

## 2019-05-22 MED ORDER — ONDANSETRON HCL 4 MG PO TABS: 4.0000 mg | ORAL_TABLET | Freq: Four times a day (QID) | ORAL | Status: DC | PRN

## 2019-05-22 MED ORDER — ONDANSETRON HCL 4 MG/2ML IJ SOLN: 4.0000 mg | Freq: Four times a day (QID) | INTRAMUSCULAR | Status: DC | PRN

## 2019-05-22 MED ORDER — METOCLOPRAMIDE HCL 5 MG/ML IJ SOLN: 5.0000 mg | Freq: Three times a day (TID) | INTRAMUSCULAR | Status: DC | PRN

## 2019-05-22 MED ORDER — LIDOCAINE 2% (20 MG/ML) 5 ML SYRINGE
INTRAMUSCULAR | Status: AC
  Filled 2019-05-22: qty 5

## 2019-05-22 MED ORDER — PROPOFOL 10 MG/ML IV BOLUS
INTRAVENOUS | Status: DC | PRN
  Administered 2019-05-22: 200 mg via INTRAVENOUS

## 2019-05-22 MED ORDER — METOCLOPRAMIDE HCL 5 MG PO TABS: 5.0000 mg | ORAL_TABLET | Freq: Three times a day (TID) | ORAL | Status: DC | PRN

## 2019-05-22 MED ORDER — OXYCODONE HCL 5 MG PO TABS
ORAL_TABLET | ORAL | Status: AC
  Filled 2019-05-22: qty 1

## 2019-05-22 MED ORDER — MAGNESIUM CITRATE PO SOLN: 1.0000 | Freq: Once | ORAL | Status: DC | PRN

## 2019-05-22 MED ORDER — SODIUM CHLORIDE 0.9 % IV SOLN: INTRAVENOUS | Status: DC

## 2019-05-22 MED ORDER — FENTANYL CITRATE (PF) 100 MCG/2ML IJ SOLN
25.0000 ug | INTRAMUSCULAR | Status: DC | PRN
  Administered 2019-05-22 (×2): 50 ug via INTRAVENOUS

## 2019-05-22 MED ORDER — OXYCODONE HCL 5 MG/5ML PO SOLN: 5.0000 mg | Freq: Once | ORAL | Status: AC | PRN

## 2019-05-22 MED ORDER — POLYETHYLENE GLYCOL 3350 17 G PO PACK: 17.0000 g | PACK | Freq: Every day | ORAL | Status: DC | PRN

## 2019-05-22 MED ORDER — SODIUM CHLORIDE 0.9 % IV SOLN: INTRAVENOUS | Status: AC

## 2019-05-22 MED ORDER — ONDANSETRON HCL 4 MG/2ML IJ SOLN
INTRAMUSCULAR | Status: DC | PRN
  Administered 2019-05-22 (×2): 4 mg via INTRAVENOUS

## 2019-05-22 MED ORDER — ONDANSETRON HCL 4 MG/2ML IJ SOLN: 4.0000 mg | Freq: Once | INTRAMUSCULAR | Status: DC | PRN

## 2019-05-22 MED ORDER — MIDAZOLAM HCL 2 MG/2ML IJ SOLN
INTRAMUSCULAR | Status: AC
  Filled 2019-05-22: qty 2

## 2019-05-22 MED ORDER — LIDOCAINE HCL (CARDIAC) PF 100 MG/5ML IV SOSY
PREFILLED_SYRINGE | INTRAVENOUS | Status: DC | PRN
  Administered 2019-05-22: 50 mg via INTRAVENOUS

## 2019-05-22 MED ORDER — INSULIN ASPART 100 UNIT/ML ~~LOC~~ SOLN
10.0000 [IU] | Freq: Once | SUBCUTANEOUS | Status: AC
  Administered 2019-05-22: 10 [IU] via SUBCUTANEOUS

## 2019-05-22 MED ORDER — PHENYLEPHRINE 40 MCG/ML (10ML) SYRINGE FOR IV PUSH (FOR BLOOD PRESSURE SUPPORT)
PREFILLED_SYRINGE | INTRAVENOUS | Status: AC
  Filled 2019-05-22: qty 10

## 2019-05-22 MED ORDER — DEXTROSE 50 % IV SOLN: 25.0000 mL | Freq: Once | INTRAVENOUS | Status: AC

## 2019-05-22 MED ORDER — DEXTROSE IN LACTATED RINGERS 5 % IV SOLN: INTRAVENOUS | Status: DC

## 2019-05-22 MED ORDER — ACETAMINOPHEN 325 MG PO TABS
325.0000 mg | ORAL_TABLET | Freq: Four times a day (QID) | ORAL | Status: DC | PRN
  Administered 2019-05-22 – 2019-05-23 (×2): 650 mg via ORAL
  Filled 2019-05-22: qty 2

## 2019-05-22 MED ORDER — MIDAZOLAM HCL 5 MG/5ML IJ SOLN
INTRAMUSCULAR | Status: DC | PRN
  Administered 2019-05-22: 2 mg via INTRAVENOUS

## 2019-05-22 MED ORDER — PROPOFOL 10 MG/ML IV BOLUS
INTRAVENOUS | Status: AC
  Filled 2019-05-22: qty 20

## 2019-05-22 MED ORDER — OXYCODONE HCL 5 MG PO TABS
10.0000 mg | ORAL_TABLET | ORAL | Status: DC | PRN
  Administered 2019-05-23 – 2019-05-24 (×2): 15 mg via ORAL
  Filled 2019-05-22 (×2): qty 3

## 2019-05-22 MED ORDER — LORAZEPAM 2 MG/ML IJ SOLN
0.5000 mg | INTRAMUSCULAR | Status: AC
  Administered 2019-05-23: 0.5 mg via INTRAVENOUS
  Filled 2019-05-22: qty 1

## 2019-05-22 MED ORDER — PHENYLEPHRINE 40 MCG/ML (10ML) SYRINGE FOR IV PUSH (FOR BLOOD PRESSURE SUPPORT)
PREFILLED_SYRINGE | INTRAVENOUS | Status: DC | PRN
  Administered 2019-05-22 (×2): 80 ug via INTRAVENOUS

## 2019-05-22 MED ORDER — FENTANYL CITRATE (PF) 250 MCG/5ML IJ SOLN
INTRAMUSCULAR | Status: AC
  Filled 2019-05-22: qty 5

## 2019-05-22 MED ORDER — OXYCODONE HCL 5 MG PO TABS
5.0000 mg | ORAL_TABLET | Freq: Once | ORAL | Status: AC | PRN
  Administered 2019-05-22: 5 mg via ORAL

## 2019-05-22 MED ORDER — PENICILLIN G POTASSIUM 20000000 UNITS IJ SOLR
12.0000 10*6.[IU] | Freq: Two times a day (BID) | INTRAVENOUS | Status: DC
  Administered 2019-05-22: 12 10*6.[IU] via INTRAVENOUS
  Filled 2019-05-22 (×2): qty 12

## 2019-05-22 MED ORDER — FENTANYL CITRATE (PF) 250 MCG/5ML IJ SOLN
INTRAMUSCULAR | Status: DC | PRN
  Administered 2019-05-22: 100 ug via INTRAVENOUS
  Administered 2019-05-22: 50 ug via INTRAVENOUS
  Administered 2019-05-22: 100 ug via INTRAVENOUS

## 2019-05-22 MED ORDER — DEXTROSE 50 % IV SOLN
25.0000 g | INTRAVENOUS | Status: AC
  Administered 2019-05-22: 25 g via INTRAVENOUS
  Filled 2019-05-22: qty 50

## 2019-05-22 MED ORDER — DEXTROSE 50 % IV SOLN
INTRAVENOUS | Status: AC
  Filled 2019-05-22: qty 50

## 2019-05-22 MED ORDER — DEXTROSE 50 % IV SOLN
25.0000 g | INTRAVENOUS | Status: AC
  Filled 2019-05-22: qty 50

## 2019-05-22 MED ORDER — OXYCODONE HCL 5 MG PO TABS
5.0000 mg | ORAL_TABLET | ORAL | Status: DC | PRN
  Administered 2019-05-22 – 2019-05-26 (×9): 10 mg via ORAL
  Filled 2019-05-22 (×9): qty 2

## 2019-05-22 MED ORDER — METHOCARBAMOL 500 MG PO TABS
500.0000 mg | ORAL_TABLET | Freq: Four times a day (QID) | ORAL | Status: DC | PRN
  Administered 2019-05-22 – 2019-05-26 (×7): 500 mg via ORAL
  Filled 2019-05-22 (×7): qty 1

## 2019-05-22 MED ORDER — HYDROMORPHONE HCL 1 MG/ML IJ SOLN
0.5000 mg | INTRAMUSCULAR | Status: DC | PRN
  Administered 2019-05-22 – 2019-05-24 (×8): 1 mg via INTRAVENOUS
  Filled 2019-05-22 (×8): qty 1

## 2019-05-22 MED ORDER — DEXTROSE 50 % IV SOLN
INTRAVENOUS | Status: AC
  Administered 2019-05-22: 25 mL via INTRAVENOUS
  Filled 2019-05-22: qty 50

## 2019-05-22 MED ORDER — FENTANYL CITRATE (PF) 100 MCG/2ML IJ SOLN
INTRAMUSCULAR | Status: AC
  Filled 2019-05-22: qty 2

## 2019-05-22 MED ORDER — 0.9 % SODIUM CHLORIDE (POUR BTL) OPTIME
TOPICAL | Status: DC | PRN
  Administered 2019-05-22: 16:00:00 1000 mL

## 2019-05-22 MED ORDER — METHOCARBAMOL 1000 MG/10ML IJ SOLN
500.0000 mg | Freq: Four times a day (QID) | INTRAVENOUS | Status: DC | PRN
  Filled 2019-05-22: qty 5

## 2019-05-22 MED ORDER — DOCUSATE SODIUM 100 MG PO CAPS
100.0000 mg | ORAL_CAPSULE | Freq: Two times a day (BID) | ORAL | Status: DC
  Filled 2019-05-22 (×2): qty 1

## 2019-05-22 MED ORDER — BISACODYL 10 MG RE SUPP: 10.0000 mg | Freq: Every day | RECTAL | Status: DC | PRN

## 2019-05-22 SURGICAL SUPPLY — 23 items
COVER SURGICAL LIGHT HANDLE (MISCELLANEOUS) ×6 IMPLANT
DRAPE U-SHAPE 47X51 STRL (DRAPES) ×3 IMPLANT
DRESSING PREVENA PLUS CUSTOM (GAUZE/BANDAGES/DRESSINGS) IMPLANT
DRSG PREVENA PLUS CUSTOM (GAUZE/BANDAGES/DRESSINGS) ×3
DURAPREP 26ML APPLICATOR (WOUND CARE) ×3 IMPLANT
ELECT REM PT RETURN 9FT ADLT (ELECTROSURGICAL)
ELECTRODE REM PT RTRN 9FT ADLT (ELECTROSURGICAL) IMPLANT
GLOVE BIOGEL PI IND STRL 9 (GLOVE) ×1 IMPLANT
GLOVE BIOGEL PI INDICATOR 9 (GLOVE) ×2
GLOVE SURG ORTHO 9.0 STRL STRW (GLOVE) ×3 IMPLANT
GOWN STRL REUS W/ TWL XL LVL3 (GOWN DISPOSABLE) ×2 IMPLANT
GOWN STRL REUS W/TWL XL LVL3 (GOWN DISPOSABLE) ×4
KIT BASIN OR (CUSTOM PROCEDURE TRAY) ×3 IMPLANT
KIT DRSG PREVENA PLUS 7DAY 125 (MISCELLANEOUS) ×2 IMPLANT
KIT TURNOVER KIT B (KITS) ×3 IMPLANT
NS IRRIG 1000ML POUR BTL (IV SOLUTION) ×3 IMPLANT
PACK ORTHO EXTREMITY (CUSTOM PROCEDURE TRAY) ×3 IMPLANT
PAD ARMBOARD 7.5X6 YLW CONV (MISCELLANEOUS) ×6 IMPLANT
SUT ETHILON 2 0 PSLX (SUTURE) ×4 IMPLANT
TOWEL GREEN STERILE (TOWEL DISPOSABLE) ×3 IMPLANT
TUBE CONNECTING 12'X1/4 (SUCTIONS) ×1
TUBE CONNECTING 12X1/4 (SUCTIONS) ×2 IMPLANT
YANKAUER SUCT BULB TIP NO VENT (SUCTIONS) ×3 IMPLANT

## 2019-05-22 NOTE — Interval H&P Note (Signed)
History and Physical Interval Note:  05/22/2019 6:49 AM  Luke [name]  has presented today for surgery, with the diagnosis of Abscess Right Arm.  The various methods of treatment have been discussed with the patient and family. After consideration of risks, benefits and other options for treatment, the patient has consented to  Procedure(s): IRRIGATION AND DEBRIDEMENT RIGHT ARM (Right) as a surgical intervention.  The patient's history has been reviewed, patient examined, no change in status, stable for surgery.  I have reviewed the patient's chart and labs.  Questions were answered to the patient's satisfaction.     [name]

## 2019-05-22 NOTE — Anesthesia Procedure Notes (Signed)
Central Venous Catheter Insertion Performed by: Roberts Gaudy, MD, anesthesiologist Start/End3/07/2019 3:10 PM, 05/22/2019 3:15 PM Patient location: OR. Preanesthetic checklist: patient identified, IV checked, site marked, risks and benefits discussed, surgical consent, monitors and equipment checked, pre-op evaluation, timeout performed and anesthesia consent Lidocaine 1% used for infiltration and patient sedated Hand hygiene performed  and maximum sterile barriers used  Catheter size: 8 Fr Total catheter length 16. Central line was placed.Double lumen Procedure performed using ultrasound guided technique. Ultrasound Notes:image(s) printed for medical record Attempts: 1 Following insertion, dressing applied and line sutured. Post procedure assessment: blood return through all ports  Patient tolerated the procedure well with no immediate complications.

## 2019-05-22 NOTE — Anesthesia Preprocedure Evaluation (Signed)
Anesthesia Evaluation  Patient identified by MRN, date of birth, ID band Patient awake    Reviewed: Allergy & Precautions, NPO status , Patient's Chart, lab work & pertinent test results  Airway Mallampati: II  TM Distance: >3 FB Neck ROM: Full    Dental  (+) Teeth Intact, Dental Advisory Given   Pulmonary Current Smoker,    breath sounds clear to auscultation       Cardiovascular hypertension,  Rhythm:Regular Rate:Normal     Neuro/Psych    GI/Hepatic   Endo/Other  diabetes  Renal/GU      Musculoskeletal   Abdominal   Peds  Hematology   Anesthesia Other Findings   Reproductive/Obstetrics                             Anesthesia Physical Anesthesia Plan  ASA: III  Anesthesia Plan: General   Post-op Pain Management:    Induction: Intravenous  PONV Risk Score and Plan: Ondansetron  Airway Management Planned: Oral ETT  Additional Equipment: CVP  Intra-op Plan:   Post-operative Plan: Extubation in OR  Informed Consent: I have reviewed the patients History and Physical, chart, labs and discussed the procedure including the risks, benefits and alternatives for the proposed anesthesia with the patient or authorized representative who has indicated his/her understanding and acceptance.     Dental advisory given  Plan Discussed with: CRNA and Anesthesiologist  Anesthesia Plan Comments:         Anesthesia Quick Evaluation

## 2019-05-22 NOTE — Progress Notes (Signed)
Pt back to room from MRI. PCN infusion restarted and MRI notified of pt requesting for something prior to MRI attempt and MD on unit notified. Will continue to closely monitor. Delia Heady RN

## 2019-05-22 NOTE — Progress Notes (Signed)
Pt back to unit from OR; pt alert and verbally responsive; pt spouse at bedside; IV intact and transfusing to left neck DL IJ. Pt right surgical incision dsg clean, dry and intact with coban; wound vac intact to incision site; no drainage noted in wound vac. On, low continuous suctioning; pt able to wrinkle finger but report pain to hand; VSS: pt refused telemetry; MD notified; pt due to void; eating in room. Call light within reach. Will continue to closely monitor. Francis Gaines Haygen Zebrowski RN   05/22/19 1738  Vitals  Temp 98 F (36.7 C)  Temp Source Oral  BP 135/79  MAP (mmHg) 97  BP Location Left Leg  BP Method Automatic  Patient Position (if appropriate) Lying  Pulse Rate 92  Pulse Rate Source Dinamap  Resp 20  Level of Consciousness  Level of Consciousness Alert  Oxygen Therapy  SpO2 100 %  O2 Device Room Air  MEWS Score  MEWS Temp 0  MEWS Systolic 0  MEWS Pulse 0  MEWS RR 0  MEWS LOC 0  MEWS Score 0  MEWS Score Color Green

## 2019-05-22 NOTE — Progress Notes (Signed)
PROGRESS NOTE  Luke [name] FBP:[id number] DOB: May 16, 1980 DOA: 05/19/2019 PCP: Gildardo Pounds, NP  Brief history:   Luke [name] is a 39 y.o. 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  Patient recently admitted on 2/20 for sepsis secondary to endocarditis and ultimately left AMA on 2/27 prior to completion of IV abx treatment. He reports feeling scared and that is why he left but after talking with family, they convinced him to come back and complete treatment. Denies any complaints currently. He last used heroin yesterday. Reports he did not take his blood pressure or DM medications today but has otherwise been compliant. Denies any pain. Denies problems eating or drinking. Denies fever, chills, cough, SOB, chest pain, abdominal pain, nausea, vomiting, diarrhea, constipation, dysuria, hematuria, hematochezia, melena, difficulty moving arms/legs, speech difficulty, trouble eating, confusion or any other complaints.  In the ED: Tachycardic with elevated blood pressure otherwise afebrile and stable. Labs: K 3.4, glucose 93, Cr 1.49, WBC 22.1, Hgb 9.9, Trop 20>25, ESR 62. ED provider called for admission for continuation of prior IV abx treatment.   HPI/Recap of past 24 hours:  He is npo awaiting for procedure this pm, blood glucose remains very elevated He agrees to get mri spine done this am Continue to Report back pain, nonradiating, no bowel and bladder control issues ,no weakness in legs Denies chest pain, no short of breath, no cough Denies arm pain, denies foot pain,  no fever  Assessment/Plan: Principal Problem:   Drug abuse, IV ([name]) Active Problems:   Tobacco abuse   Essential hypertension   Abscess   CKD (chronic kidney disease) stage 3, GFR 30-59 ml/min   Cocaine abuse ([name])   Uncontrolled insulin  dependent type 1 diabetes mellitus ([name])   Diabetic polyneuropathy associated with type 1 diabetes mellitus ([name])   Hepatitis C   Hypothyroidism   Sepsis ([place])   Heroin abuse ([place])   Acute bacterial endocarditis   Endocarditis   Abscess of right forearm  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, in setting of IVDU; sepsis present on admission  -Tachycardic on admission with WBC 22.1 -repeat blood culture no growth, WBC trending down today is 15 -Continue gent and penicillin -Infectious disease input appreciated, follow recommendation  Right forearm abscesses -Ortho consulted, plan for I&D today in OR  Back pain, awaiting for MRI  Thrombocytosis Likely reactive, repeat CBC, trending down from 724 to 521   Insulin-dependent type 2 diabetes, uncontrolled -A1c 10.9 - IV antibiotics carrier fluids is dextrose,  -Blood glucose remain elevated ,continue adjust insulin as needed  Hyponatremia - IV antibiotics carrier fluids is dextrose, -Control blood glucose, start normal saline infusion  AKI on CKD 2, anemia of chronic disease -Creatinine trend up to 1.5, he is on gentamicin - get UA, start gentle hydration -Repeat BMP, renal dosing meds    Hypertension, continued on home medication lisinopril and Norvasc Added low-dose Coreg  Hyperlipidemia ,continue statin  Hypothyroidism, continue Synthroid  Polysubstance abuse Education provided, patient states he is ready to quit  DVT Prophylaxis: Lovenox  Code Status: Full  Family Communication: patient   Disposition Plan:    Patient came from:                    home  Anticipated d/c place: home   Barriers to d/c OR conditions which need to be met to effect a safe d/c: needs iv abx , h/o ivdu, needs ID and ortho consult, not ready to  discharge   Consultants:  Ortho  ID  Procedures:  None  Antibiotics:  Gentamicin and penicillin G   Objective: BP (!) 143/83 (BP Location: Right Arm)   Pulse 81   Temp 97.8 F (36.6 C) (Oral)   Resp 18   Ht '5\' 9"'$  (1.753 m)   Wt 90 kg   SpO2 100%   BMI 29.30 kg/m   Intake/Output Summary (Last 24 hours) at 05/22/2019 0932 Last data filed at 05/22/2019 0600 Gross per 24 hour  Intake 1656.55 ml  Output 1000 ml  Net 656.55 ml   Filed Weights   05/19/19 2126  Weight: 90 kg    Exam: Patient is examined daily including today on 05/22/2019, exams remain the same as of yesterday except that has changed    General:  NAD  Cardiovascular: RRR  Respiratory: CTABL  Abdomen: Soft/ND/NT, positive BS  Musculoskeletal: multiple necrotic wounds,induration, track marks, nontender, no active drainage, tenderness mid back and low back  Neuro: alert, oriented   Data Reviewed: Basic Metabolic Panel: Recent Labs  Lab 05/16/19 0434 05/19/19 2138 05/20/19 0955 05/21/19 0616 05/22/19 0616  NA 131* 140 133* 132* 129*  K 4.4 3.4* 4.0 4.7 4.7  CL 98 106 99 98 98  CO2 '25 22 23 22 '$ 21*  GLUCOSE 469* 93 165* 292* 495*  BUN '18 17 14 8 12  '$ CREATININE 1.62* 1.49* 1.20 1.28* 1.51*  CALCIUM 8.7* 9.2 8.5* 8.6* 8.9  MG 1.8  --   --   --   --   PHOS 4.5  --   --   --   --    Liver Function Tests: Recent Labs  Lab 05/16/19 0434 05/20/19 0955 05/21/19 0616 05/22/19 0616  AST '23 19 20 24  '$ ALT '28 25 25 25  '$ ALKPHOS 117 119 118 120  BILITOT 0.2* 0.5 0.7 0.5  PROT 6.0* 6.3* 6.5 6.5  ALBUMIN 2.4* 2.5* 2.5* 2.6*   No results for input(s): LIPASE, AMYLASE in the last 168 hours. No results for input(s): AMMONIA in the last 168 hours. CBC: Recent Labs  Lab 05/16/19 0434 05/19/19 2138 05/21/19 0616 05/22/19 0616  WBC 14.4* 22.1* 16.0* 15.0*  NEUTROABS 9.1*  --   --   --   HGB 9.0* 9.9* 9.2* 9.4*  HCT 28.5* 31.5* 29.7* 29.7*  MCV 74.8* 75.5* 74.1* 74.1*  PLT 549* 724* 544*  521*   Cardiac Enzymes:   No results for input(s): CKTOTAL, CKMB, CKMBINDEX, TROPONINI in the last 168 hours. BNP (last 3 results) No results for input(s): BNP in the last 8760 hours.  ProBNP (last 3 results) No results for input(s): PROBNP in the last 8760 hours.  CBG: Recent Labs  Lab 05/21/19 1048 05/21/19 1616 05/21/19 2051 05/22/19 0633 05/22/19 0820  GLUCAP 457* 272* 334* 461* 370*    Recent Results (from the past 240 hour(s))  Culture, blood (routine x 2)     Status: None (Preliminary result)   Collection Time: 05/20/19  8:10 AM   Specimen: BLOOD RIGHT ARM  Result Value Ref Range Status   Specimen Description BLOOD RIGHT ARM  Final   Special Requests   Final    BOTTLES DRAWN AEROBIC ONLY Blood Culture results may not be optimal due to an inadequate volume of blood received in  culture bottles   Culture   Final    NO GROWTH 1 DAY Performed at [name], [place] [address], [place], [place] [address]    Report Status PENDING  Incomplete  Culture, blood (routine x 2)     Status: None (Preliminary result)   Collection Time: 05/20/19  8:10 AM   Specimen: BLOOD RIGHT ARM  Result Value Ref Range Status   Specimen Description BLOOD RIGHT ARM  Final   Special Requests   Final    BOTTLES DRAWN AEROBIC ONLY Blood Culture results may not be optimal due to an inadequate volume of blood received in culture bottles   Culture   Final    NO GROWTH 1 DAY Performed at [facility], [place] [address] [place], [place] [address]    Report Status PENDING  Incomplete     Studies: No results found.  Scheduled Meds: . amLODipine  5 mg Oral Daily  . atorvastatin  20 mg Oral Daily  . carvedilol  3.125 mg Oral BID WC  . chlorhexidine  60 mL Topical Once  . DULoxetine  30 mg Oral Daily  . enoxaparin (LOVENOX) injection  40 mg Subcutaneous Daily  . gabapentin  400 mg Oral TID  . insulin aspart  0-15 Units Subcutaneous TID WC  . insulin aspart  3 Units Subcutaneous TID WC   . insulin glargine  30 Units Subcutaneous BID  . levothyroxine  50 mcg Oral Daily  . lisinopril  10 mg Oral Daily  . pantoprazole  40 mg Oral Daily  . senna-docusate  1 tablet Oral BID  . sertraline  50 mg Oral Daily    Continuous Infusions: . gentamicin 210 mg (05/22/19 0045)  . penicillin g continuous IV infusion 12 Million Units (05/22/19 8335)     Time spent: 79mns I have personally reviewed and interpreted on  05/22/2019 daily labs, tele strips, imagings as discussed above under date review session and assessment and plans.  I reviewed all nursing notes, pharmacy notes, consultant notes,  vitals, pertinent old records  I have discussed plan of care as described above with RN , patient  on 05/22/2019   FFlorencia ReasonsMD, PhD, FACP  [facility]  Available via Epic secure chat 7am-7pm for nonurgent issues Please page for urgent issues, pager number available through [email or web address] .   05/22/2019, 9:32 AM  LOS: 2 days

## 2019-05-22 NOTE — Transfer of Care (Signed)
Immediate Anesthesia Transfer of Care Note  [name]  Procedure(s) Performed: IRRIGATION AND DEBRIDEMENT RIGHT ARM (Right )  Patient Location: PACU  Anesthesia Type:General  Level of Consciousness: awake, alert , oriented and patient cooperative  Airway & Oxygen Therapy: Patient Spontanous Breathing and Patient connected to nasal cannula oxygen  Post-op Assessment: Report given to RN and Post -op Vital signs reviewed and stable  Post vital signs: Reviewed and stable  Last Vitals:  Vitals Value Taken Time  BP 106/81 05/22/19 1633  Temp 36.6 C 05/22/19 1618  Pulse 85 05/22/19 1638  Resp 24 05/22/19 1638  SpO2 100 % 05/22/19 1638  Vitals shown include unvalidated device data.  Last Pain:  Vitals:   05/22/19 1618  TempSrc:   PainSc: Asleep      Patients Stated Pain Goal: 2 (50/15/86 8257)  Complications: none

## 2019-05-22 NOTE — Progress Notes (Signed)
MRI called and notified of medication order received for pt prior to MRI; MRI staff informed RN they will notify RN when they ready for pt and when to give ordered ativan. Delia Heady RN

## 2019-05-22 NOTE — Progress Notes (Signed)
Pt transported off unit to OR for surgery. Pt telemetry removed; pt transported off unit with IV intact and transfusing. Reported off to short stay. Pt refused to removed his wedding ring. Short stay updated on pt CBG and ring on finger. Delia Heady RN

## 2019-05-22 NOTE — Progress Notes (Signed)
Inpatient Diabetes Program Recommendations  AACE/ADA: New Consensus Statement on Inpatient Glycemic Control (2015)  Target Ranges:  Prepandial:   less than 140 mg/dL      Peak postprandial:   less than 180 mg/dL (1-2 hours)      Critically ill patients:  140 - 180 mg/dL   Lab Results  Component Value Date   GLUCAP 370 (H) 05/22/2019   HGBA1C 10.9 (H) 05/20/2019    Review of Glycemic Control  Results for RONEN, BROMWELL (MRN [id number]) as of 05/22/2019 10:09  Ref. Range 05/21/2019 10:48 05/21/2019 16:16 05/21/2019 20:51 05/22/2019 06:33 05/22/2019 08:20  Glucose-Capillary Latest Ref Range: 70 - 99 mg/dL 457 (H)  Novolog 18units 272 (H)  Novolog 11 units  334 (H)  No Coverage 461 (H)  Novolog 10units 370 (H)  Novolog 15units   Diabetes history: DM1 Outpatient Diabetes medications: Lantus 40 units BID + Humalog 4-20 units TID +  Current orders for Inpatient glycemic control: Lantus 30 units BID + Novolog 0-15 TID + Novolog 3 units TID with meals  Inpatient Diabetes Program Recommendations:      -Lantus 40 units BID (home dose) -Novolog 0-5 QHS -Novolog 0-20 TID   Note: Plan for I&D today of right forearm.  Spoke with RN, Rod Holler and 15 units of Novolog was given at 0820 to bring blood sugar down for surgery today per MD order.  RN plans to monitor blood sugar closely and watch for hypoglycemia as he received Novolog 10 units at 0649 as well.     Thank you, Reche Dixon, RN, BSN Diabetes Coordinator Inpatient Diabetes Program [phone] (team pager from 8a-5p)

## 2019-05-22 NOTE — Op Note (Signed)
05/22/2019  4:25 PM  PATIENT:  Luke [name]    PRE-OPERATIVE DIAGNOSIS:  Abscess Right Arm  POST-OPERATIVE DIAGNOSIS:  Same  PROCEDURE:  IRRIGATION AND DEBRIDEMENT RIGHT ARM multiple abscesses. Local tissue rearrangement for wound closure 3 x 15 cm. Application of customizable wound VAC.  SURGEON:  [name], MD  PHYSICIAN ASSISTANT:None ANESTHESIA:   General  PREOPERATIVE INDICATIONS:  [name] is a  39 y.o. male with a diagnosis of Abscess Right Arm who failed conservative measures and elected for surgical management.    The risks benefits and alternatives were discussed with the patient preoperatively including but not limited to the risks of infection, bleeding, nerve injury, cardiopulmonary complications, the need for revision surgery, among others, and the patient was willing to proceed.  OPERATIVE IMPLANTS: Customizable wound VAC  @ENCIMAGES @  OPERATIVE FINDINGS: Multiple abscesses tissue and purulence sent for cultures  OPERATIVE PROCEDURE: Patient brought the operating room and underwent a general anesthetic.  After levels anesthesia were obtained patient's right upper extremity was prepped using DuraPrep draped in the sterile field a timeout was called.  A longitudinal incision was made along the ulnar border.  The necrotic ulcers along the ulnar border were resected this left a wound that was 15 x 3 cm.  There is also multiple other necrotic lesions on the arm that were superficially excised these also had purulent drainage from beneath this area.  All this tissue was sent for culture and sensitivity.  The wounds were irrigated with normal saline.  Local tissue rearrangement was used to close the wound 3 x 15 cm over the forearm.  Customizable Prevena wound VAC sponge was applied to cover all the wounds.  This was covered in [place].  Patient was extubated taken the PACU in stable condition.   DISCHARGE PLANNING:  Antibiotic duration: Continue IV antibiotics  and adjust pending the culture sensitivities  Weightbearing: Not applicable  Pain medication: Opioid pathway  Dressing care/ Wound VAC: Continue wound VAC for 1 week after discharge  Ambulatory devices: Not applicable  Discharge to: Anticipate discharge to home  Follow-up: In the office 1 week post operative.

## 2019-05-22 NOTE — Anesthesia Procedure Notes (Signed)
Procedure Name: Intubation Date/Time: 05/22/2019 3:18 PM Performed by: Shirlyn Goltz, CRNA Pre-anesthesia Checklist: Patient identified, Emergency Drugs available, Suction available and Patient being monitored Patient Re-evaluated:Patient Re-evaluated prior to induction Oxygen Delivery Method: Circle system utilized Preoxygenation: Pre-oxygenation with 100% oxygen Induction Type: IV induction Ventilation: Mask ventilation without difficulty Laryngoscope Size: Miller and 2 Grade View: Grade I Tube type: Oral Tube size: 7.0 mm Number of attempts: 1 Airway Equipment and Method: Stylet Placement Confirmation: ETT inserted through vocal cords under direct vision,  positive ETCO2 and breath sounds checked- equal and bilateral Secured at: 21 cm Tube secured with: Tape Dental Injury: Teeth and Oropharynx as per pre-operative assessment

## 2019-05-22 NOTE — Progress Notes (Signed)
[facility] for Infectious Disease    Date of Admission:  05/19/2019   Total days of antibiotics 3           ID: Luke [name] is a 39 y.o. male with  Streptococcal MV endocarditis and right arm deep tissue infection Principal Problem:   Drug abuse, IV ([place]) Active Problems:   Tobacco abuse   Essential hypertension   Abscess   CKD (chronic kidney disease) stage 3, GFR 30-59 ml/min   Cocaine abuse ([place])   Uncontrolled insulin dependent type 1 diabetes mellitus ([place])   Diabetic polyneuropathy associated with type 1 diabetes mellitus ([name])   Hepatitis C   Hypothyroidism   Sepsis ([place])   Heroin abuse ([place])   Acute bacterial endocarditis   Endocarditis   Abscess of right forearm    Subjective: Afebrile. No complaints  Medications:  . amLODipine  5 mg Oral Daily  . atorvastatin  20 mg Oral Daily  . carvedilol  3.125 mg Oral BID WC  . Chlorhexidine Gluconate Cloth  6 each Topical Daily  . dextrose  25 g Intravenous STAT  . docusate sodium  100 mg Oral BID  . DULoxetine  30 mg Oral Daily  . enoxaparin (LOVENOX) injection  40 mg Subcutaneous Daily  . fentaNYL      . gabapentin  400 mg Oral TID  . insulin aspart  0-15 Units Subcutaneous TID WC  . insulin aspart  3 Units Subcutaneous TID WC  . insulin glargine  30 Units Subcutaneous BID  . levothyroxine  50 mcg Oral Daily  . lisinopril  10 mg Oral Daily  . LORazepam  0.5 mg Intravenous On Call  . oxyCODONE      . pantoprazole  40 mg Oral Daily  . senna-docusate  1 tablet Oral BID  . sertraline  50 mg Oral Daily    Objective: Vital signs in last 24 hours: Temp:  [97.4 F (36.3 C)-98.9 F (37.2 C)] 98 F (36.7 C) (03/05 1738) Pulse Rate:  [81-128] 92 (03/05 1738) Resp:  [10-22] 20 (03/05 1738) BP: (106-149)/(65-99) 135/79 (03/05 1738) SpO2:  [98 %-100 %] 100 % (03/05 1738) Weight:  [90 kg] 90 kg (03/05 1443) Physical Exam  Constitutional: He is oriented to person, place, and time. He appears well-developed and  well-nourished. No distress.  HENT:  Mouth/Throat: Oropharynx is clear and moist. No oropharyngeal exudate.  Cardiovascular: Normal rate, regular rhythm and normal heart sounds. Exam reveals no gallop and no friction rub.  No murmur heard.  Pulmonary/Chest: Effort normal and breath sounds normal. No respiratory distress. He has no wheezes.  Ext: right forearm swelling and multiple eschar Psychiatric: He has a normal mood and affect. His behavior is normal.   Lab Results Recent Labs    05/21/19 0616 05/22/19 0616  WBC 16.0* 15.0*  HGB 9.2* 9.4*  HCT 29.7* 29.7*  NA 132* 129*  K 4.7 4.7  CL 98 98  CO2 22 21*  BUN 8 12  CREATININE 1.28* 1.51*   Liver Panel Recent Labs    05/21/19 0616 05/22/19 0616  PROT 6.5 6.5  ALBUMIN 2.5* 2.6*  AST 20 24  ALT 25 25  ALKPHOS 118 120  BILITOT 0.7 0.5   Sedimentation Rate Recent Labs    05/19/19 2138  ESRSEDRATE 23*    Microbiology: reviewed Studies/Results: DG CHEST PORT 1 VIEW  Result Date: 05/22/2019 CLINICAL DATA:  Central line placement. EXAM: PORTABLE CHEST 1 VIEW COMPARISON:  May 19, 2019 FINDINGS:  Since the prior study there is been interval placement of a left internal jugular venous catheter. Its distal tip is noted near the junction of the superior vena cava and right atrium. A trace amount of atelectasis is seen within the left lung base. There is no evidence of a pleural effusion or pneumothorax. The heart size and mediastinal contours are within normal limits. The visualized skeletal structures are unremarkable. IMPRESSION: 1. Interval left internal jugular venous catheter placement and positioning, as described above, when compared to the prior study dated May 19, 2019. Electronically Signed   By: Virgina Norfolk M.D.   On: 05/22/2019 17:17     Assessment/Plan: Plan for penicillin and gent x 14 days. Currently on day 3. Awaiting I x D to help with source control.  [facility] for Infectious  Diseases Cell: [phone] Pager: [phone]  05/22/2019, 6:04 PM

## 2019-05-22 NOTE — Progress Notes (Signed)
Hypoglycemic Event  CBG: 49 and rechecked to be 42  Treatment: D50 50 mL (25 gm)  Symptoms:  pt didn't have any symptoms at the time of CBG check but report feeling his sugar was dropping. Later report having shaky   Follow-up CBG: Time:1403 CBG Result:146  Possible Reasons for Event: Pt was NPO for surgery  Comments/MD notified: Dr. Georgianne Fick, [place]

## 2019-05-22 NOTE — Progress Notes (Signed)
MRI attempted on pt.  Pt would not hold still and states he is claustrophobic.  Please call MRI if pt willing to try again.

## 2019-05-22 NOTE — Progress Notes (Signed)
Hypoglycemic Event  CBG: 72  Treatment: D50 25 mL (12.5 gm)  Symptoms: None  Possible Reasons for Event: Unknown  Comments/MD notified: Dr. Linna Caprice notified. Verbal order received for 25g Dextrose 50% and change fluids to D5LR.   Feliz Beam, RN

## 2019-05-22 NOTE — Progress Notes (Signed)
Patient transported to MRI 

## 2019-05-23 ENCOUNTER — Inpatient Hospital Stay (HOSPITAL_COMMUNITY): Payer: Commercial Managed Care - PPO

## 2019-05-23 LAB — COMPREHENSIVE METABOLIC PANEL
ALT: 21 U/L (ref 0–44)
AST: 18 U/L (ref 15–41)
Albumin: 2.2 g/dL — ABNORMAL LOW (ref 3.5–5.0)
Alkaline Phosphatase: 103 U/L (ref 38–126)
Anion gap: 9 (ref 5–15)
BUN: 15 mg/dL (ref 6–20)
CO2: 20 mmol/L — ABNORMAL LOW (ref 22–32)
Calcium: 7.9 mg/dL — ABNORMAL LOW (ref 8.9–10.3)
Chloride: 99 mmol/L (ref 98–111)
Creatinine, Ser: 1.72 mg/dL — ABNORMAL HIGH (ref 0.61–1.24)
GFR calc Af Amer: 57 mL/min — ABNORMAL LOW (ref >60)
GFR calc non Af Amer: 49 mL/min — ABNORMAL LOW (ref >60)
Glucose, Bld: 585 mg/dL (ref 70–99)
Potassium: 4.4 mmol/L (ref 3.5–5.1)
Sodium: 128 mmol/L — ABNORMAL LOW (ref 135–145)
Total Bilirubin: 0.3 mg/dL (ref 0.3–1.2)
Total Protein: 5.7 g/dL — ABNORMAL LOW (ref 6.5–8.1)

## 2019-05-23 LAB — GLUCOSE, CAPILLARY
Glucose-Capillary: 365 mg/dL — ABNORMAL HIGH (ref 70–99)
Glucose-Capillary: 30 mg/dL — CL (ref 70–99)
Glucose-Capillary: 65 mg/dL — ABNORMAL LOW (ref 70–99)
Glucose-Capillary: 129 mg/dL — ABNORMAL HIGH (ref 70–99)
Glucose-Capillary: 203 mg/dL — ABNORMAL HIGH (ref 70–99)
Glucose-Capillary: 504 mg/dL (ref 70–99)
Glucose-Capillary: 420 mg/dL — ABNORMAL HIGH (ref 70–99)
Glucose-Capillary: 348 mg/dL — ABNORMAL HIGH (ref 70–99)
Glucose-Capillary: 367 mg/dL — ABNORMAL HIGH (ref 70–99)

## 2019-05-23 LAB — CBC
HCT: 25.4 % — ABNORMAL LOW (ref 39.0–52.0)
Hemoglobin: 7.8 g/dL — ABNORMAL LOW (ref 13.0–17.0)
MCH: 23.7 pg — ABNORMAL LOW (ref 26.0–34.0)
MCHC: 30.7 g/dL (ref 30.0–36.0)
MCV: 77.2 fL — ABNORMAL LOW (ref 80.0–100.0)
Platelets: 496 10*3/uL — ABNORMAL HIGH (ref 150–400)
RBC: 3.29 MIL/uL — ABNORMAL LOW (ref 4.22–5.81)
RDW: 15.1 % (ref 11.5–15.5)
WBC: 16.8 10*3/uL — ABNORMAL HIGH (ref 4.0–10.5)
nRBC: 0 % (ref 0.0–0.2)

## 2019-05-23 LAB — GENTAMICIN LEVEL, TROUGH: Gentamicin Trough: <0.5 ug/mL — ABNORMAL LOW (ref 0.5–2.0)

## 2019-05-23 MED ORDER — SODIUM CHLORIDE 0.9% FLUSH
10.0000 mL | INTRAVENOUS | Status: DC | PRN
  Administered 2019-05-24: 10 mL

## 2019-05-23 MED ORDER — KETOROLAC TROMETHAMINE 15 MG/ML IJ SOLN
15.0000 mg | Freq: Four times a day (QID) | INTRAMUSCULAR | Status: DC
  Administered 2019-05-23 – 2019-05-24 (×5): 15 mg via INTRAVENOUS
  Filled 2019-05-23 (×5): qty 1

## 2019-05-23 MED ORDER — CARVEDILOL 6.25 MG PO TABS
6.2500 mg | ORAL_TABLET | Freq: Two times a day (BID) | ORAL | Status: DC
  Administered 2019-05-23 – 2019-05-24 (×2): 6.25 mg via ORAL
  Filled 2019-05-23 (×2): qty 1

## 2019-05-23 MED ORDER — LIDOCAINE 5 % EX PTCH
1.0000 | MEDICATED_PATCH | CUTANEOUS | Status: DC
  Administered 2019-05-23 – 2019-05-24 (×2): 1 via TRANSDERMAL
  Filled 2019-05-23 (×3): qty 1

## 2019-05-23 MED ORDER — SODIUM CHLORIDE 0.9 % IV SOLN
12.0000 10*6.[IU] | Freq: Two times a day (BID) | INTRAVENOUS | Status: DC
  Administered 2019-05-23 – 2019-05-29 (×12): 12 10*6.[IU] via INTRAVENOUS
  Filled 2019-05-23 (×17): qty 12

## 2019-05-23 MED ORDER — GADOBUTROL 1 MMOL/ML IV SOLN
9.0000 mL | Freq: Once | INTRAVENOUS | Status: AC | PRN
  Administered 2019-05-23: 9 mL via INTRAVENOUS

## 2019-05-23 MED ORDER — INSULIN ASPART 100 UNIT/ML ~~LOC~~ SOLN
8.0000 [IU] | Freq: Once | SUBCUTANEOUS | Status: AC
  Administered 2019-05-23: 8 [IU] via SUBCUTANEOUS

## 2019-05-23 MED ORDER — INSULIN ASPART 100 UNIT/ML ~~LOC~~ SOLN
20.0000 [IU] | Freq: Once | SUBCUTANEOUS | Status: AC
  Administered 2019-05-23: 20 [IU] via SUBCUTANEOUS

## 2019-05-23 MED ORDER — SODIUM CHLORIDE 1 G PO TABS
1.0000 g | ORAL_TABLET | Freq: Two times a day (BID) | ORAL | Status: DC
  Administered 2019-05-23: 1 g via ORAL
  Filled 2019-05-23 (×2): qty 1

## 2019-05-23 MED ORDER — DEXTROSE 50 % IV SOLN: 1.0000 | Freq: Once | INTRAVENOUS | Status: DC

## 2019-05-23 NOTE — Evaluation (Signed)
Physical Therapy Evaluation [name] MRN: [id number] DOB: 10/31/1980 Today's Date: 05/23/2019   History of Present Illness  39 y.o. male was admitted 05/12/19 for TEE without cardioversion, noted acute bacterial endocarditis.  Had readmission for RUE abscess and cellulitis, had I and D on 05/22/19 with wound vac placed.  Referred to PT for mobility ck.  PMHx:  polysubstance use, IV drug use with heroin, DM, DKA, HTN, osteomyelitis ankle, L great toe amp,  2/21 TTE showed AV/MV vegetations, anemia, CKD, HA, PNA, seizures,   Clinical Impression  Pt was seen for mobility on RW with effort to walk as he is in significant pain with new I and D along with decompression on ulnar border of arm.  Pt is not complaining with gait but in general, and asked nursing for pain meds when PT was completed.  Follow up with him to attempt stairs if time permits on Monday, and will assess his independence off the vac as well since he should be on a portable unit by then.    Follow Up Recommendations No PT follow up    Equipment Recommendations  None recommended by PT    Recommendations for Other Services       Precautions / Restrictions Precautions Precautions: Fall Precaution Comments: wound vac RUE Restrictions Weight Bearing Restrictions: No      Mobility  Bed Mobility Overal bed mobility: Modified Independent             General bed mobility comments: extra time to sit up  Transfers Overall transfer level: Needs assistance Equipment used: 1 person hand held assist Transfers: Sit to/from Stand Sit to Stand: Min guard(for safety)         General transfer comment: min guard due to lines  Ambulation/Gait Ambulation/Gait assistance: Min guard;Supervision Gait Distance (Feet): 30 Feet Assistive device: None Gait Pattern/deviations: Wide base of support Gait velocity: reduced Gait velocity interpretation: <1.31 ft/sec, indicative of household ambulator General  Gait Details: able to walk in room around bed but in a lot of pain, restricting his willingess to walk  Stairs            Wheelchair Mobility    Modified Rankin (Stroke Patients Only)       Balance Overall balance assessment: Needs assistance Sitting-balance support: Feet supported Sitting balance-Leahy Scale: Good     Standing balance support: No upper extremity supported Standing balance-Leahy Scale: Fair                               Pertinent Vitals/Pain Pain Assessment: 0-10 Pain Score: (55) Pain Location: R arm Pain Descriptors / Indicators: Operative site guarding Pain Intervention(s): Monitored during session;Repositioned;Patient requesting pain meds-RN notified    Home Living Family/patient expects to be discharged to:: Private residence Living Arrangements: Spouse/significant other Available Help at Discharge: Family;Available 24 hours/day Type of Home: House Home Access: Level entry     Home Layout: Two level;Bed/bath upstairs;1/2 bath on main level Home Equipment: Walker - 2 wheels Additional Comments: has a level place to stay    Prior Function Level of Independence: Independent         Comments: not using an AD recently     Hand Dominance   Dominant Hand: Right    Extremity/Trunk Assessment   Upper Extremity Assessment Upper Extremity Assessment: RUE deficits/detail RUE Deficits / Details: pt is using arm to move on the bed but is in a wound  vac    Lower Extremity Assessment Lower Extremity Assessment: Overall WFL for tasks assessed    Cervical / Trunk Assessment Cervical / Trunk Assessment: Normal  Communication   Communication: No difficulties  Cognition Arousal/Alertness: Lethargic Behavior During Therapy: WFL for tasks assessed/performed Overall Cognitive Status: Within Functional Limits for tasks assessed                                 General Comments: pt is sleepy but woke up to move around  with PT      General Comments General comments (skin integrity, edema, etc.): pt is up to walk with assistance due to IV and wound vac in place, but is able to maneuver with supervised help.  In a great deal of pain and asked nursing to come in to see if meds could be given    Exercises     Assessment/Plan    PT Assessment Patient needs continued PT services  PT Problem List Decreased activity tolerance;Decreased balance;Pain;Decreased skin integrity       PT Treatment Interventions DME instruction;Gait training;Stair training;Functional mobility training;Therapeutic activities;Therapeutic exercise;Balance training;Neuromuscular re-education;Patient/family education    PT Goals (Current goals can be found in the Care Plan section)  Acute Rehab PT Goals Patient Stated Goal: to go home PT Goal Formulation: With patient Time For Goal Achievement: 05/27/19 Potential to Achieve Goals: Good    Frequency Min 3X/week   Barriers to discharge Decreased caregiver support has family assistance PRN    Co-evaluation               AM-PAC PT "6 Clicks" Mobility  Outcome Measure Help needed turning from your back to your side while in a flat bed without using bedrails?: None Help needed moving from lying on your back to sitting on the side of a flat bed without using bedrails?: None Help needed moving to and from a bed to a chair (including a wheelchair)?: None Help needed standing up from a chair using your arms (e.g., wheelchair or bedside chair)?: A Little Help needed to walk in hospital room?: A Little Help needed climbing 3-5 steps with a railing? : A Little 6 Click Score: 21    End of Session Equipment Utilized During Treatment: Gait belt Activity Tolerance: Patient limited by pain Patient left: in bed;with call bell/phone within reach;with bed alarm set Nurse Communication: Mobility status PT Visit Diagnosis: Pain;Unsteadiness on feet (R26.81) Pain - Right/Left:  Right Pain - part of body: Arm    Time: 1002-1035 PT Time Calculation (min) (ACUTE ONLY): 33 min   Charges:   PT Evaluation $PT Eval Moderate Complexity: 1 Mod PT Treatments $Gait Training: 8-22 mins       Ramond Dial 05/23/2019, 1:04 PM   Mee Hives, PT MS Acute Rehab Dept. Number: [place] [facility] [place]

## 2019-05-23 NOTE — Progress Notes (Signed)
CRITICAL VALUE ALERT  Critical Value:  Blood glucose 585  Date & Time Notied:   05/23/2019 0210  Provider Notified:Charles Bodenheimer   Orders Received/Actions taken: Text paged, awaiting response.

## 2019-05-23 NOTE — Progress Notes (Signed)
[facility]    Date of Admission:  05/19/2019   Total days of antibiotics 5   ID: Luke [name] is a 39 y.o. male with   Principal Problem:   Drug abuse, IV ([place]) Active Problems:   Tobacco abuse   Essential hypertension   Abscess   CKD (chronic kidney disease) stage 3, GFR 30-59 ml/min   Cocaine abuse ([place])   Uncontrolled insulin dependent type 1 diabetes mellitus ([place])   Diabetic polyneuropathy associated with type 1 diabetes mellitus ([name])   Hepatitis C   Hypothyroidism   Sepsis ([place])   Heroin abuse ([place])   Acute bacterial endocarditis   Endocarditis   Abscess of right forearm    Subjective: Afebrile, sore arm from surgery, since he had I x D yesterday  Medications:  . amLODipine  5 mg Oral Daily  . atorvastatin  20 mg Oral Daily  . carvedilol  3.125 mg Oral BID WC  . Chlorhexidine Gluconate Cloth  6 each Topical Daily  . dextrose  25 g Intravenous STAT  . DULoxetine  30 mg Oral Daily  . enoxaparin (LOVENOX) injection  40 mg Subcutaneous Daily  . gabapentin  400 mg Oral TID  . insulin aspart  0-15 Units Subcutaneous TID WC  . insulin aspart  3 Units Subcutaneous TID WC  . insulin glargine  30 Units Subcutaneous BID  . ketorolac  15 mg Intravenous Q6H  . levothyroxine  50 mcg Oral Daily  . lisinopril  10 mg Oral Daily  . pantoprazole  40 mg Oral Daily  . senna-docusate  1 tablet Oral BID  . sertraline  50 mg Oral Daily  . sodium chloride  1 g Oral BID WC    Objective: Vital signs in last 24 hours: Temp:  [97.3 F (36.3 C)-98.6 F (37 C)] 97.3 F (36.3 C) (03/06 0506) Pulse Rate:  [83-105] 100 (03/06 0506) Resp:  [10-22] 16 (03/06 0506) BP: (106-135)/(65-81) 134/77 (03/06 0506) SpO2:  [98 %-100 %] 100 % (03/06 0506) Weight:  [90 kg] 90 kg (03/05 1443) Physical Exam  Constitutional: He is oriented to person, place, and time. He appears well-developed and well-nourished. No distress.  HENT:  Mouth/Throat: Oropharynx is clear  and moist. No oropharyngeal exudate.  Cardiovascular: Normal rate, regular rhythm and normal heart sounds. Exam reveals no gallop and no friction rub.  No murmur heard.  Pulmonary/Chest: Effort normal and breath sounds normal. No respiratory distress. He has no wheezes.  Abdominal: Soft. Bowel sounds are normal. He exhibits no distension. There is no tenderness.  HFW:YOVZC arm wrapped, wound vac Psychiatric: He has a normal mood and affect. His behavior is normal.    Lab Results Recent Labs    05/22/19 0616 05/23/19 0125  WBC 15.0* 16.8*  HGB 9.4* 7.8*  HCT 29.7* 25.4*  NA 129* 128*  K 4.7 4.4  CL 98 99  CO2 21* 20*  BUN 12 15  CREATININE 1.51* 1.72*   Liver Panel Recent Labs    05/22/19 0616 05/23/19 0125  PROT 6.5 5.7*  ALBUMIN 2.6* 2.2*  AST 24 18  ALT 25 21  ALKPHOS 120 103  BILITOT 0.5 0.3    Microbiology: reviewed Studies/Results: MR THORACIC SPINE W WO CONTRAST  Result Date: 05/23/2019 CLINICAL DATA:  Low back pain, bacteremia EXAM: MRI THORACIC AND LUMBAR SPINE WITHOUT AND WITH CONTRAST TECHNIQUE: Multiplanar and multiecho pulse sequences of the thoracic and lumbar spine were obtained without and with intravenous contrast. CONTRAST:  94mL GADAVIST GADOBUTROL 1 MMOL/ML IV SOLN COMPARISON:  None. FINDINGS: MRI THORACIC SPINE There is some motion artifact present. Alignment:  Preserved kyphosis.  No significant listhesis. Vertebrae: Chronic loss of vertebral body height at several levels my greatest at T10 where there is nearly 50% height loss. There is no evidence of osteomyelitis or discitis. Mild enhancement at the T8-T9 disc level may be on a degenerative basis as there is no corresponding edema. Cord:  No abnormal signal within the above limitation. Paraspinal and other soft tissues: Unremarkable. Disc levels: Mild degenerative disc disease at lower thoracic levels. For example, disc bulges at T9-T10 and T11-T12. There is no significant degenerative stenosis. MRI  LUMBAR SPINE Segmentation:  Standard. Alignment:  Anteroposterior alignment is maintained. Vertebrae:  There is no evidence of discitis or osteomyelitis. Conus medullaris: Extends to the L1-L2 level and appears normal. No abnormal enhancement. Paraspinal and other soft tissues: Unremarkable. Disc levels: Disc heights and signal are maintained. There is no degenerative stenosis. IMPRESSION: No evidence of osteomyelitis or discitis.  No epidural collection. Electronically Signed   By: Macy Mis M.D.   On: 05/23/2019 07:10   MR Lumbar Spine W Wo Contrast  Result Date: 05/23/2019 CLINICAL DATA:  Low back pain, bacteremia EXAM: MRI THORACIC AND LUMBAR SPINE WITHOUT AND WITH CONTRAST TECHNIQUE: Multiplanar and multiecho pulse sequences of the thoracic and lumbar spine were obtained without and with intravenous contrast. CONTRAST:  76mL GADAVIST GADOBUTROL 1 MMOL/ML IV SOLN COMPARISON:  None. FINDINGS: MRI THORACIC SPINE There is some motion artifact present. Alignment:  Preserved kyphosis.  No significant listhesis. Vertebrae: Chronic loss of vertebral body height at several levels my greatest at T10 where there is nearly 50% height loss. There is no evidence of osteomyelitis or discitis. Mild enhancement at the T8-T9 disc level may be on a degenerative basis as there is no corresponding edema. Cord:  No abnormal signal within the above limitation. Paraspinal and other soft tissues: Unremarkable. Disc levels: Mild degenerative disc disease at lower thoracic levels. For example, disc bulges at T9-T10 and T11-T12. There is no significant degenerative stenosis. MRI LUMBAR SPINE Segmentation:  Standard. Alignment:  Anteroposterior alignment is maintained. Vertebrae:  There is no evidence of discitis or osteomyelitis. Conus medullaris: Extends to the L1-L2 level and appears normal. No abnormal enhancement. Paraspinal and other soft tissues: Unremarkable. Disc levels: Disc heights and signal are maintained. There is no  degenerative stenosis. IMPRESSION: No evidence of osteomyelitis or discitis.  No epidural collection. Electronically Signed   By: Macy Mis M.D.   On: 05/23/2019 07:10   DG CHEST PORT 1 VIEW  Result Date: 05/22/2019 CLINICAL DATA:  Central line placement. EXAM: PORTABLE CHEST 1 VIEW COMPARISON:  May 19, 2019 FINDINGS: Since the prior study there is been interval placement of a left internal jugular venous catheter. Its distal tip is noted near the junction of the superior vena cava and right atrium. A trace amount of atelectasis is seen within the left lung base. There is no evidence of a pleural effusion or pneumothorax. The heart size and mediastinal contours are within normal limits. The visualized skeletal structures are unremarkable. IMPRESSION: 1. Interval left internal jugular venous catheter placement and positioning, as described above, when compared to the prior study dated May 19, 2019. Electronically Signed   By: Virgina Norfolk M.D.   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

## 2019-05-23 NOTE — Progress Notes (Signed)
PROGRESS NOTE  Luke [name] LTJ:[id number] DOB: 1980/05/25 DOA: 05/19/2019 PCP: Gildardo Pounds, NP  Brief history:   Luke [name] is a 39 y.o. 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  Patient recently admitted on 2/20 for sepsis secondary to endocarditis and ultimately left AMA on 2/27 prior to completion of IV abx treatment. He reports feeling scared and that is why he left but after talking with family, they convinced him to come back and complete treatment. Denies any complaints currently. He last used heroin yesterday. Reports he did not take his blood pressure or DM medications today but has otherwise been compliant. Denies any pain. Denies problems eating or drinking. Denies fever, chills, cough, SOB, chest pain, abdominal pain, nausea, vomiting, diarrhea, constipation, dysuria, hematuria, hematochezia, melena, difficulty moving arms/legs, speech difficulty, trouble eating, confusion or any other complaints.  In the ED: Tachycardic with elevated blood pressure otherwise afebrile and stable. Labs: K 3.4, glucose 93, Cr 1.49, WBC 22.1, Hgb 9.9, Trop 20>25, ESR 62. ED provider called for admission for continuation of prior IV abx treatment.   HPI/Recap of past 24 hours:  S/p irrigation and debridement multiple abscesses right forearm yesterday, has a wound vac on , no fever, WBC remains elevated Has hyperglycemia and hypoglycemia    Assessment/Plan: Principal Problem:   Drug abuse, IV ([name]) Active Problems:   Tobacco abuse   Essential hypertension   Abscess   CKD (chronic kidney disease) stage 3, GFR 30-59 ml/min   Cocaine abuse ([name])   Uncontrolled insulin dependent type 1 diabetes mellitus ([name])   Diabetic polyneuropathy associated with type 1 diabetes mellitus ([name])   Hepatitis C   Hypothyroidism  Sepsis ([name])   Heroin abuse ([name])   Acute bacterial endocarditis   Endocarditis   Abscess of right forearm  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  No epidural collection."   Thrombocytosis Likely reactive, repeat CBC, trending down from 724 to 496   Insulin-dependent type 2 diabetes, uncontrolled, with hyperglycemia and hypoglycemia -A1c 10.9 - IV antibiotics carrier fluids is dextrose,  -continue adjust insulin as needed, continue hypoglycemia protocol  Hyponatremia - IV antibiotics carrier fluids is dextrose, -Control blood glucose -start salt tabs  AKI on CKD 2, anemia of chronic disease -Creatinine trend up to 1.75, he is on gentamicin - get UA cloudy with rare bacteria, urine culture , renal US -Repeat BMP, renal dosing meds (hold lisinopril)    Hypertension, hold home medication lisinopril and Norvasc Started Coreg and titrate up, he has tendency to have tachycardia,  Cr elevated  Hyperlipidemia ,continue statin  Hypothyroidism, continue Synthroid  Polysubstance abuse Education provided, patient states he is ready to quit  DVT Prophylaxis: Lovenox  Code Status: Full  Family Communication: patient   Disposition Plan:    Patient came from:                    home  Anticipated d/c place: home   Barriers to d/c OR conditions which need to be met to effect a safe d/c:  needs iv abx , h/o ivdu, needs ID and ortho consult, not ready to discharge   Consultants:  Ortho  ID  Procedures: irrigation and debridement multiple abscesses right forearm with wound vac application on 3/5  Antibiotics:  Gentamicin and penicillin G   Objective: BP 134/77 (BP Location: Left Arm)   Pulse 100   Temp (!) 97.3 F (36.3 C) (Oral)   Resp 16   Ht '5\' 9"'$  (1.753 m)   Wt 90 kg   SpO2 100%   BMI 29.30 kg/m   Intake/Output Summary (Last 24 hours) at 05/23/2019 1242 Last data filed at 05/23/2019 0757 Gross per 24 hour  Intake 3004.4 ml  Output 750 ml  Net 2254.4 ml   Filed Weights   05/19/19 2126 05/22/19 1443  Weight: 90 kg 90 kg    Exam: Patient is examined daily including today on 05/23/2019, exams remain the same as of yesterday except that has changed    General:  NAD  Cardiovascular: RRR  Respiratory: CTABL  Abdomen: Soft/ND/NT, positive BS  Musculoskeletal: right forearm post op changes with wound vac attached, track marks, h/o left foot big toe amputation  Neuro: alert, oriented   Data Reviewed: Basic Metabolic Panel: Recent Labs  Lab 05/19/19 2138 05/20/19 0955 05/21/19 0616 05/22/19 0616 05/23/19 0125  NA 140 133* 132* 129* 128*  K 3.4* 4.0 4.7 4.7 4.4  CL 106 99 98 98 99  CO2 '22 23 22 '$ 21* 20*  GLUCOSE 93 165* 292* 495* 585*  BUN '17 14 8 12 15  '$ CREATININE 1.49* 1.20 1.28* 1.51* 1.72*  CALCIUM 9.2 8.5* 8.6* 8.9 7.9*   Liver Function Tests: Recent Labs  Lab 05/20/19 0955 05/21/19 0616 05/22/19 0616 05/23/19 0125  AST '19 20 24 18  '$ ALT '25 25 25 21  '$ ALKPHOS 119 118 120 103  BILITOT 0.5 0.7 0.5 0.3  PROT 6.3* 6.5 6.5 5.7*  ALBUMIN 2.5* 2.5* 2.6* 2.2*   No results for input(s): LIPASE, AMYLASE in the last 168 hours. No results for input(s): AMMONIA in the last 168 hours. CBC: Recent Labs  Lab 05/19/19 2138 05/21/19 0616 05/22/19 0616 05/23/19 0125  WBC 22.1* 16.0* 15.0* 16.8*  HGB 9.9* 9.2* 9.4* 7.8*  HCT 31.5*  29.7* 29.7* 25.4*  MCV 75.5* 74.1* 74.1* 77.2*  PLT 724* 544* 521* 496*   Cardiac Enzymes:   No results for input(s): CKTOTAL, CKMB, CKMBINDEX, TROPONINI in the last 168 hours. BNP (last 3 results) No results for input(s): BNP in the last 8760 hours.  ProBNP (last 3 results) No results for input(s): PROBNP in the last 8760 hours.  CBG: Recent Labs  Lab 05/23/19 0741 05/23/19 0745 05/23/19 0819 05/23/19 0919 05/23/19 1114  GLUCAP 33* 30* 65* 129* 203*    Recent Results (from the past 240 hour(s))  Culture, blood (routine x 2)     Status: None (Preliminary result)   Collection Time: 05/20/19  8:10 AM   Specimen: BLOOD RIGHT ARM  Result Value Ref Range Status   Specimen Description BLOOD RIGHT ARM  Final   Special Requests   Final    BOTTLES DRAWN AEROBIC ONLY Blood Culture results may not be optimal due to an inadequate volume of blood received in culture bottles   Culture   Final    NO GROWTH 3 DAYS Performed at [facility], [place] [address]  [place], [place] [address]    Report Status PENDING  Incomplete  Culture, blood (routine x 2)     Status: None (Preliminary result)   Collection Time: 05/20/19  8:10 AM   Specimen: BLOOD RIGHT ARM  Result Value Ref Range Status   Specimen Description BLOOD RIGHT ARM  Final   Special Requests   Final    BOTTLES DRAWN AEROBIC ONLY Blood Culture results may not be optimal due to an inadequate volume of blood received in culture bottles   Culture   Final    NO GROWTH 3 DAYS Performed at [facility], [place] [address] [place], [place] [address]    Report Status PENDING  Incomplete  MRSA PCR Screening     Status: None   Collection Time: 05/22/19  1:56 PM   Specimen: Nasal Mucosa; Nasopharyngeal  Result Value Ref Range Status   MRSA by PCR NEGATIVE NEGATIVE Final    Comment:        The GeneXpert MRSA Assay (FDA approved for NASAL specimens only), is one component of a comprehensive MRSA colonization surveillance  program. It is not intended to diagnose MRSA infection nor to guide or monitor treatment for MRSA infections. Performed at [facility], [place] [address] [place], [place] [address]   Aerobic/Anaerobic Culture (surgical/deep wound)     Status: None (Preliminary result)   Collection Time: 05/22/19  4:00 PM   Specimen: Wound; Abscess  Result Value Ref Range Status   Specimen Description ABSCESS  Final   Special Requests RT ARM  Final   Gram Stain   Final    FEW WBC PRESENT, PREDOMINANTLY PMN NO ORGANISMS SEEN Performed at [facility], [address], [place],  [address]    Culture PENDING  Incomplete   Report Status PENDING  Incomplete     Studies: MR THORACIC SPINE W WO CONTRAST  Result Date: 05/23/2019 CLINICAL DATA:  Low back pain, bacteremia EXAM: MRI THORACIC AND LUMBAR SPINE WITHOUT AND WITH CONTRAST TECHNIQUE: Multiplanar and multiecho pulse sequences of the thoracic and lumbar spine were obtained without and with intravenous contrast. CONTRAST:  71m GADAVIST GADOBUTROL 1 MMOL/ML IV SOLN COMPARISON:  None. FINDINGS: MRI THORACIC SPINE There is some motion artifact present. Alignment:  Preserved kyphosis.  No significant listhesis. Vertebrae: Chronic loss of vertebral body height at several levels my greatest at T10 where there is nearly 50% height loss. There is no evidence of osteomyelitis or discitis. Mild enhancement at the T8-T9 disc level may be on a degenerative basis as there is no corresponding edema. Cord:  No abnormal signal within the above limitation. Paraspinal and other soft tissues: Unremarkable. Disc levels: Mild degenerative disc disease at lower thoracic levels. For example, disc bulges at T9-T10 and T11-T12. There is no significant degenerative stenosis. MRI LUMBAR SPINE Segmentation:  Standard. Alignment:  Anteroposterior alignment is maintained. Vertebrae:  There is no evidence of discitis or osteomyelitis. Conus medullaris: Extends to the L1-L2  level and appears normal. No abnormal enhancement. Paraspinal and other soft tissues: Unremarkable. Disc levels: Disc heights and signal are maintained. There is no degenerative stenosis. IMPRESSION: No evidence of osteomyelitis or discitis.  No epidural collection. Electronically Signed   By: PMacy MisM.D.   On: 05/23/2019 07:10   MR Lumbar Spine W Wo Contrast  Result Date: 05/23/2019 CLINICAL DATA:  Low back pain, bacteremia EXAM: MRI THORACIC AND LUMBAR SPINE WITHOUT AND WITH CONTRAST TECHNIQUE: Multiplanar and multiecho pulse sequences of the thoracic and  lumbar spine were obtained without and with intravenous contrast. CONTRAST:  33m GADAVIST GADOBUTROL 1 MMOL/ML IV SOLN COMPARISON:  None. FINDINGS: MRI THORACIC SPINE There is some motion artifact present. Alignment:  Preserved kyphosis.  No significant listhesis. Vertebrae: Chronic loss of vertebral body height at several levels my greatest at T10 where there is nearly 50% height loss. There is no evidence of osteomyelitis or discitis. Mild enhancement at the T8-T9 disc level may be on a degenerative basis as there is no corresponding edema. Cord:  No abnormal signal within the above limitation. Paraspinal and other soft tissues: Unremarkable. Disc levels: Mild degenerative disc disease at lower thoracic levels. For example, disc bulges at T9-T10 and T11-T12. There is no significant degenerative stenosis. MRI LUMBAR SPINE Segmentation:  Standard. Alignment:  Anteroposterior alignment is maintained. Vertebrae:  There is no evidence of discitis or osteomyelitis. Conus medullaris: Extends to the L1-L2 level and appears normal. No abnormal enhancement. Paraspinal and other soft tissues: Unremarkable. Disc levels: Disc heights and signal are maintained. There is no degenerative stenosis. IMPRESSION: No evidence of osteomyelitis or discitis.  No epidural collection. Electronically Signed   By: PMacy MisM.D.   On: 05/23/2019 07:10   DG CHEST PORT 1  VIEW  Result Date: 05/22/2019 CLINICAL DATA:  Central line placement. EXAM: PORTABLE CHEST 1 VIEW COMPARISON:  May 19, 2019 FINDINGS: Since the prior study there is been interval placement of a left internal jugular venous catheter. Its distal tip is noted near the junction of the superior vena cava and right atrium. A trace amount of atelectasis is seen within the left lung base. There is no evidence of a pleural effusion or pneumothorax. The heart size and mediastinal contours are within normal limits. The visualized skeletal structures are unremarkable. IMPRESSION: 1. Interval left internal jugular venous catheter placement and positioning, as described above, when compared to the prior study dated May 19, 2019. Electronically Signed   By: TVirgina NorfolkM.D.   On: 05/22/2019 17:17    Scheduled Meds: . amLODipine  5 mg Oral Daily  . atorvastatin  20 mg Oral Daily  . carvedilol  3.125 mg Oral BID WC  . Chlorhexidine Gluconate Cloth  6 each Topical Daily  . dextrose  25 g Intravenous STAT  . docusate sodium  100 mg Oral BID  . DULoxetine  30 mg Oral Daily  . enoxaparin (LOVENOX) injection  40 mg Subcutaneous Daily  . gabapentin  400 mg Oral TID  . insulin aspart  0-15 Units Subcutaneous TID WC  . insulin aspart  3 Units Subcutaneous TID WC  . insulin glargine  30 Units Subcutaneous BID  . ketorolac  15 mg Intravenous Q6H  . levothyroxine  50 mcg Oral Daily  . lisinopril  10 mg Oral Daily  . pantoprazole  40 mg Oral Daily  . senna-docusate  1 tablet Oral BID  . sertraline  50 mg Oral Daily    Continuous Infusions: . sodium chloride    . gentamicin Stopped (05/23/19 0349)  . methocarbamol (ROBAXIN) IV    . penicillin g continuous IV infusion 12 Million Units (05/23/19 0815)     Time spent: 364ms I have personally reviewed and interpreted on  05/23/2019 daily labs,  imagings as discussed above under date review session and assessment and plans.  I reviewed all nursing notes,  pharmacy notes, consultant notes,  vitals, pertinent old records  I have discussed plan of care as described above with RN , patient  on 05/23/2019  Florencia Reasons MD, PhD, FACP  [facility]  Available via Epic secure chat 7am-7pm for nonurgent issues Please page for urgent issues, pager number available through [email or web address] .   05/23/2019, 12:42 PM  LOS: 3 days

## 2019-05-23 NOTE — Progress Notes (Signed)
Patient off unit to ultrasound.

## 2019-05-23 NOTE — Progress Notes (Signed)
Pharmacy Antibiotic Note  Luke [name] is a 39 y.o. male admitted on 05/19/2019 with bacteremia/endocarditis.  Pharmacy has been consulted for Gentamicin dosing.   Pt has recent history of streptococcal mitis/oralis bacteremia/endocarditis for which he was receiving combination therapy with continuous PCN/gentamicin. He also received a dose of Orbactiv on 2/21.  He left AMA on 2/27 without completion of therapy. Readmitted 3/3.   SCr trending up to 1.72 today, est CrCl ~64 ml/min. WBC 16.8.  Gentamicin trough < 0.5 (at goal)  Continuous PCN 2/23>>2/27, 3/3>> Norva Karvonen 2/22>>2/26, 3/3>> Orbactiv x 1 2/21  Plan: -Continuous PCN infusion per MD -Continue Gentamicin 210 mg IV q24h -Watch Scr closely   Height: 5\' 9"  (175.3 cm) Weight: 198 lb 6.6 oz (90 kg) IBW/kg (Calculated) : 70.7  Temp (24hrs), Avg:98 F (36.7 C), Min:97.4 F (36.3 C), Max:98.6 F (37 C)  Recent Labs  Lab 05/16/19 0434 05/16/19 0434 05/19/19 2138 05/20/19 0208 05/20/19 0955 05/21/19 0616 05/22/19 0616 05/23/19 0030 05/23/19 0125  WBC 14.4*  --  22.1*  --   --  16.0* 15.0*  --  16.8*  CREATININE 1.62*   < > 1.49*  --  1.20 1.28* 1.51*  --  1.72*  LATICACIDVEN  --   --   --  1.0  --   --   --   --   --   GENTTROUGH  --   --   --   --   --   --   --  <0.5*  --    < > = values in this interval not displayed.    Estimated Creatinine Clearance: 63.9 mL/min (A) (by C-G formula based on SCr of 1.72 mg/dL (H)).    No Known Allergies  Sherlon Handing, PharmD, BCPS Please see amion for complete clinical pharmacist phone list 05/23/2019 2:18 AM

## 2019-05-23 NOTE — Progress Notes (Signed)
CRITICAL VALUE ALERT  Critical Value:  504  Date & Time Notied:  05/23/19 at 1606  Provider Notified: Dr. Erlinda Hong  Orders Received/Actions taken: MD ordered to give pt 15 units novolog with the ordered additional meal coverage dose of 3 units. 18 units given. Pt noted to be eating candies and chewing gum in room; pt advised and educated; MD notified as well. Pt CBG rechecked at to be 420 and will reassess again at 2000. P. Angelica Pou RN

## 2019-05-23 NOTE — Progress Notes (Signed)
Reported off to oncoming RN concerning pt CBG of 420 and RN to recheck pt CBG at 2000. Pt remains asymptomatic. P. Angelica Pou RN

## 2019-05-23 NOTE — Progress Notes (Signed)
CBG 348 at 1950. Will continue to monitor .

## 2019-05-23 NOTE — Progress Notes (Signed)
Hypoglycemic Event  CBG: 33  Treatment: 8 oz juice/soda  Symptoms: None  Follow-up CBG: Time: 0819 CBG Result: 65  Possible Reasons for Event: Unknown  Comments/MD notified: Dr. Erlinda Hong notified. Pt asymptomatic; pt woke up to eat breakfast. CBG rechecked again and now 129.    Rina Adney

## 2019-05-23 NOTE — Progress Notes (Signed)
Patient ID: Luke [name], male   DOB: Dec 17, 1980, 39 y.o.   MRN: [id number] Patient is postoperative day 1 irrigation and debridement multiple abscesses right forearm.  He had several areas of gangrenous ulcerative tissue that were unroofed with purulence beneath these necrotic ulcers.  Tissue was sent for cultures patient also underwent a long incision along the ulnar border for extensive decompression.  These areas were irrigated with normal saline the local tissue was rearranged and closed and a wound VAC was applied.  There is no drainage in the wound VAC canister there are no checks on the wound VAC pump.  Continue IV antibiotics will reinforce the wound VAC dressing on Monday with anticipation of discharge with the portable Praveena wound VAC pump.

## 2019-05-24 LAB — GLUCOSE, CAPILLARY
Glucose-Capillary: 158 mg/dL — ABNORMAL HIGH (ref 70–99)
Glucose-Capillary: 283 mg/dL — ABNORMAL HIGH (ref 70–99)
Glucose-Capillary: 339 mg/dL — ABNORMAL HIGH (ref 70–99)
Glucose-Capillary: 128 mg/dL — ABNORMAL HIGH (ref 70–99)
Glucose-Capillary: 117 mg/dL — ABNORMAL HIGH (ref 70–99)

## 2019-05-24 LAB — URINE CULTURE: Culture: NO GROWTH

## 2019-05-24 LAB — COMPREHENSIVE METABOLIC PANEL
ALT: 19 U/L (ref 0–44)
AST: 16 U/L (ref 15–41)
Albumin: 2.1 g/dL — ABNORMAL LOW (ref 3.5–5.0)
Alkaline Phosphatase: 92 U/L (ref 38–126)
Anion gap: 8 (ref 5–15)
BUN: 16 mg/dL (ref 6–20)
CO2: 21 mmol/L — ABNORMAL LOW (ref 22–32)
Calcium: 8.2 mg/dL — ABNORMAL LOW (ref 8.9–10.3)
Chloride: 108 mmol/L (ref 98–111)
Creatinine, Ser: 2.17 mg/dL — ABNORMAL HIGH (ref 0.61–1.24)
GFR calc Af Amer: 43 mL/min — ABNORMAL LOW (ref >60)
GFR calc non Af Amer: 37 mL/min — ABNORMAL LOW (ref >60)
Glucose, Bld: 338 mg/dL — ABNORMAL HIGH (ref 70–99)
Potassium: 4.6 mmol/L (ref 3.5–5.1)
Sodium: 137 mmol/L (ref 135–145)
Total Bilirubin: 0.4 mg/dL (ref 0.3–1.2)
Total Protein: 5.4 g/dL — ABNORMAL LOW (ref 6.5–8.1)

## 2019-05-24 LAB — CBC
HCT: 24 % — ABNORMAL LOW (ref 39.0–52.0)
Hemoglobin: 7.4 g/dL — ABNORMAL LOW (ref 13.0–17.0)
MCH: 23.7 pg — ABNORMAL LOW (ref 26.0–34.0)
MCHC: 30.8 g/dL (ref 30.0–36.0)
MCV: 76.9 fL — ABNORMAL LOW (ref 80.0–100.0)
Platelets: 494 10*3/uL — ABNORMAL HIGH (ref 150–400)
RBC: 3.12 MIL/uL — ABNORMAL LOW (ref 4.22–5.81)
RDW: 15.4 % (ref 11.5–15.5)
WBC: 13 10*3/uL — ABNORMAL HIGH (ref 4.0–10.5)
nRBC: 0 % (ref 0.0–0.2)

## 2019-05-24 MED ORDER — CARVEDILOL 12.5 MG PO TABS
12.5000 mg | ORAL_TABLET | Freq: Two times a day (BID) | ORAL | Status: DC
  Administered 2019-05-24 – 2019-05-29 (×10): 12.5 mg via ORAL
  Filled 2019-05-24 (×10): qty 1

## 2019-05-24 MED ORDER — HYDROMORPHONE HCL 2 MG PO TABS
1.0000 mg | ORAL_TABLET | Freq: Four times a day (QID) | ORAL | Status: DC | PRN
  Administered 2019-05-25 – 2019-05-26 (×5): 1 mg via ORAL
  Filled 2019-05-24 (×6): qty 1

## 2019-05-24 MED ORDER — HYDROMORPHONE HCL 1 MG/ML IJ SOLN
0.5000 mg | Freq: Once | INTRAMUSCULAR | Status: AC
  Administered 2019-05-24: 0.5 mg via INTRAVENOUS
  Filled 2019-05-24: qty 1

## 2019-05-24 MED ORDER — GABAPENTIN 100 MG PO CAPS
200.0000 mg | ORAL_CAPSULE | Freq: Three times a day (TID) | ORAL | Status: DC
  Administered 2019-05-24 – 2019-05-29 (×15): 200 mg via ORAL
  Filled 2019-05-24 (×15): qty 2

## 2019-05-24 MED ORDER — GENTAMICIN VARIABLE DOSE PER UNSTABLE RENAL FUNCTION (PHARMACIST DOSING): Status: DC

## 2019-05-24 MED ORDER — SODIUM CHLORIDE 0.9 % IV SOLN: INTRAVENOUS | Status: AC

## 2019-05-24 MED ORDER — SODIUM BICARBONATE 650 MG PO TABS
650.0000 mg | ORAL_TABLET | Freq: Three times a day (TID) | ORAL | Status: AC
  Administered 2019-05-24 – 2019-05-25 (×6): 650 mg via ORAL
  Filled 2019-05-24 (×6): qty 1

## 2019-05-24 MED ORDER — TRAZODONE HCL 50 MG PO TABS
50.0000 mg | ORAL_TABLET | Freq: Every day | ORAL | Status: DC
  Administered 2019-05-24 – 2019-05-28 (×5): 50 mg via ORAL
  Filled 2019-05-24 (×5): qty 1

## 2019-05-24 NOTE — Progress Notes (Addendum)
PROGRESS NOTE  Luke [name] WUJ:[id number] DOB: [birth year] DOA: 05/19/2019 PCP: Gildardo Pounds, NP  Brief history:   Luke [name] is a 39 y.o. 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  Patient recently admitted on 2/20 for sepsis secondary to endocarditis and ultimately left AMA on 2/27 prior to completion of IV abx treatment. He reports feeling scared and that is why he left but after talking with family, they convinced him to come back and complete treatment. Denies any complaints currently. He last used heroin yesterday. Reports he did not take his blood pressure or DM medications today but has otherwise been compliant. Denies any pain. Denies problems eating or drinking. Denies fever, chills, cough, SOB, chest pain, abdominal pain, nausea, vomiting, diarrhea, constipation, dysuria, hematuria, hematochezia, melena, difficulty moving arms/legs, speech difficulty, trouble eating, confusion or any other complaints.  In the ED: Tachycardic with elevated blood pressure otherwise afebrile and stable. Labs: K 3.4, glucose 93, Cr 1.49, WBC 22.1, Hgb 9.9, Trop 20>25, ESR 62. ED provider called for admission for continuation of prior IV abx treatment.   HPI/Recap of past 24 hours:  -C/o upper back pain, report Dilaudid helped slightly, non of the other measures helped  reports last use of heroin several days ago, he does not feel he is going through withdraw, he denies craving  -C/o insomnia, state trazadone helps -S/p irrigation and debridement multiple abscesses right forearm on 3/5, has a wound vac on ,  no fever, WBC trending down -Has hyperglycemia and hypoglycemia , diabetes diet noncompliance   Assessment/Plan: Principal Problem:   Drug abuse, IV ([name]) Active Problems:   Tobacco abuse   Essential hypertension  Abscess   CKD (chronic kidney disease) stage 3, GFR 30-59 ml/min   Cocaine abuse ([name])   Uncontrolled insulin dependent type 1 diabetes mellitus ([name])   Diabetic polyneuropathy associated with type 1 diabetes mellitus ([name])   Hepatitis C   Hypothyroidism   Sepsis ([place])   Heroin abuse ([place])   Acute bacterial endocarditis   Endocarditis   Abscess of right forearm  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, in setting of IVDU; sepsis present on admission  -Tachycardic on admission with WBC 22.1 -repeat blood culture no growth, WBC 13 -Continue gent and penicillin -Infectious disease input appreciated, follow recommendation  Right forearm abscesses -s/p irrigation and debridement multiple abscesses right forearm with wound vac placement on 3/5, surgical wound culture in process -Post op he was started on toradol , this is discontinued due to aki -Ortho input appreciated  AKI on CKD 2, anemia of chronic disease -Creatinine trend up to 1.75, he is on gentamicin - get UA cloudy with rare bacteria, urine culture ,  -renal US no hydronephrosis - renal dosing meds (hold lisinopril), discontinue Toradol, decrease Neurontin dose , pharmacy to check gentamicin level ,start gentle hydration with normal saline 75 cc/h for 24 hours, monitor volume status, is now +4 L already -Metabolic acidosis, start sodium bicarb supplement -Repeat BMP  Thrombocytosis Likely reactive, repeat CBC, trending down from 724 to 494  Hyponatremia - IV antibiotics carrier fluids is dextrose, -Control blood glucose -normalized  Insulin-dependent type 2 diabetes, uncontrolled, with hyperglycemia and hypoglycemia -A1c 10.9 - IV antibiotics carrier fluids is dextrose,  -Also has diet noncompliance , RN reports "Patient also noted to be eating frosted flakes and fruit  loops " -Diabetes diet education -Currently on Lantus 30 units twice a day, NovoLog 3 units meal coverage and  SSI -continue adjust insulin as needed - continue hypoglycemia protocol  Hypertension, hold home medication lisinopril and Norvasc Started Coreg and titrate up, he has tendency to have tachycardia,  Cr elevated  Hyperlipidemia ,continue statin  Hypothyroidism, continue Synthroid   Upper Back pain,  MRI "  No evidence of osteomyelitis or discitis.  No epidural collection." Chronic loss of vertebral body height at several levels my greatest at T10 where there is nearly 50% height loss.correlate with h/o back injury when he was 84, he reports has been having upper back pain since he was 16  k pad, prn dilaudid, he is not interested in methadone or Suboxone, he stated he is not in withdrawal, he is not craving  Hepatitis c : follow up with ID  Polysubstance abuse Education provided, patient states he is ready to quit  DVT Prophylaxis: Lovenox  Code Status: Full  Family Communication: patient   Disposition Plan:    Patient came from:                    home                                                                                      Anticipated d/c place: home   Barriers to d/c OR conditions which need to be met to effect a safe d/c: needs iv abx , h/o ivdu   Consultants:  Ortho  ID  Procedures: irrigation and debridement multiple abscesses right forearm with wound vac application on 3/5  Antibiotics:  Gentamicin and penicillin G   Objective: BP (!) 149/92 (BP Location: Left Arm)   Pulse 82   Temp 97.6 F (36.4 C) (Oral)   Resp 18   Ht '5\' 9"'$  (1.753 m)   Wt 90 kg   SpO2 100%   BMI 29.30 kg/m   Intake/Output Summary (Last 24 hours) at 05/24/2019 0736 Last data filed at 05/24/2019 0011 Gross per 24 hour  Intake 1311.84 ml  Output 950 ml  Net 361.84 ml   Filed Weights   05/19/19 2126 05/22/19 1443  Weight: 90 kg 90 kg    Exam: Patient is examined daily including today on 05/24/2019, exams remain the same as of yesterday except that has changed     General:  NAD  Cardiovascular: RRR  Respiratory: CTABL  Abdomen: Soft/ND/NT, positive BS  Musculoskeletal: right forearm post op changes with wound vac attached, track marks, h/o left foot big toe amputation  Neuro: alert, oriented   Data Reviewed: Basic Metabolic Panel: Recent Labs  Lab 05/20/19 0955 05/21/19 0616 05/22/19 0616 05/23/19 0125 05/24/19 0316  NA 133* 132* 129* 128* 137  K 4.0 4.7 4.7 4.4 4.6  CL 99 98 98 99 108  CO2 23 22 21* 20* 21*  GLUCOSE 165* 292* 495* 585* 338*  BUN '14 8 12 15 16  '$ CREATININE 1.20 1.28* 1.51* 1.72* 2.17*  CALCIUM 8.5* 8.6* 8.9 7.9* 8.2*   Liver Function Tests: Recent Labs  Lab 05/20/19 0955 05/21/19 0616 05/22/19 0616 05/23/19 0125 05/24/19  0316  AST '19 20 24 18 16  '$ ALT '25 25 25 21 19  '$ ALKPHOS 119 118 120 103 92  BILITOT 0.5 0.7 0.5 0.3 0.4  PROT 6.3* 6.5 6.5 5.7* 5.4*  ALBUMIN 2.5* 2.5* 2.6* 2.2* 2.1*   No results for input(s): LIPASE, AMYLASE in the last 168 hours. No results for input(s): AMMONIA in the last 168 hours. CBC: Recent Labs  Lab 05/19/19 2138 05/21/19 0616 05/22/19 0616 05/23/19 0125 05/24/19 0316  WBC 22.1* 16.0* 15.0* 16.8* 13.0*  HGB 9.9* 9.2* 9.4* 7.8* 7.4*  HCT 31.5* 29.7* 29.7* 25.4* 24.0*  MCV 75.5* 74.1* 74.1* 77.2* 76.9*  PLT 724* 544* 521* 496* 494*   Cardiac Enzymes:   No results for input(s): CKTOTAL, CKMB, CKMBINDEX, TROPONINI in the last 168 hours. BNP (last 3 results) No results for input(s): BNP in the last 8760 hours.  ProBNP (last 3 results) No results for input(s): PROBNP in the last 8760 hours.  CBG: Recent Labs  Lab 05/23/19 1606 05/23/19 1757 05/23/19 1950 05/23/19 2208 05/24/19 0626  GLUCAP 504* 420* 348* 367* 158*    Recent Results (from the past 240 hour(s))  Culture, blood (routine x 2)     Status: None (Preliminary result)   Collection Time: 05/20/19  8:10 AM   Specimen: BLOOD RIGHT ARM  Result Value Ref Range Status   Specimen Description BLOOD  RIGHT ARM  Final   Special Requests   Final    BOTTLES DRAWN AEROBIC ONLY Blood Culture results may not be optimal due to an inadequate volume of blood received in culture bottles   Culture   Final    NO GROWTH 3 DAYS Performed at [facility], [place] [address], [place], [place] [address]    Report Status PENDING  Incomplete  Culture, blood (routine x 2)     Status: None (Preliminary result)   Collection Time: 05/20/19  8:10 AM   Specimen: BLOOD RIGHT ARM  Result Value Ref Range Status   Specimen Description BLOOD RIGHT ARM  Final   Special Requests   Final    BOTTLES DRAWN AEROBIC ONLY Blood Culture results may not be optimal due to an inadequate volume of blood received in culture bottles   Culture   Final    NO GROWTH 3 DAYS Performed at [facility], [place] [address] [place], [place] [address]    Report Status PENDING  Incomplete  MRSA PCR Screening     Status: None   Collection Time: 05/22/19  1:56 PM   Specimen: Nasal Mucosa; Nasopharyngeal  Result Value Ref Range Status   MRSA by PCR NEGATIVE NEGATIVE Final    Comment:        The GeneXpert MRSA Assay (FDA approved for NASAL specimens only), is one component of a comprehensive MRSA colonization surveillance program. It is not intended to diagnose MRSA infection nor to guide or monitor treatment for MRSA infections. Performed at [facility], [place] [address], [place], [place] [address]   Aerobic/Anaerobic Culture (surgical/deep wound)     Status: None (Preliminary result)   Collection Time: 05/22/19  4:00 PM   Specimen: Wound; Abscess  Result Value Ref Range Status   Specimen Description ABSCESS  Final   Special Requests RT ARM  Final   Gram Stain   Final    FEW WBC PRESENT, PREDOMINANTLY PMN NO ORGANISMS SEEN    Culture   Final    NO GROWTH < 24 HOURS Performed at [facility], [place] [address], [place], [place] [address]    Report Status PENDING  Incomplete     Studies: US  RENAL  Result Date: June 17, 2019 CLINICAL DATA:  Renal insufficiency. EXAM: RENAL / URINARY TRACT ULTRASOUND COMPLETE COMPARISON:  Abdomen and pelvis CT dated 07/22/2017. FINDINGS: Right Kidney: Renal measurements: 10.3 x 5.4 x 5.1 cm = volume: 149 mL . Echogenicity within normal limits. No mass or hydronephrosis visualized. Left Kidney: Renal measurements: 10.2 x 5.8 x 5.7 cm = volume: 174 mL. Echogenicity within normal limits. No mass or hydronephrosis visualized. Bladder: Appears normal for degree of bladder distention. Other: None. IMPRESSION: Normal examination. Electronically Signed   By: Claudie Revering M.D.   On: 06/17/2019 20:27    Scheduled Meds: . atorvastatin  20 mg Oral Daily  . carvedilol  6.25 mg Oral BID WC  . Chlorhexidine Gluconate Cloth  6 each Topical Daily  . DULoxetine  30 mg Oral Daily  . enoxaparin (LOVENOX) injection  40 mg Subcutaneous Daily  . gabapentin  400 mg Oral TID  . insulin aspart  0-15 Units Subcutaneous TID WC  . insulin aspart  3 Units Subcutaneous TID WC  . insulin glargine  30 Units Subcutaneous BID  . ketorolac  15 mg Intravenous Q6H  . levothyroxine  50 mcg Oral Daily  . lidocaine  1 patch Transdermal Q24H  . pantoprazole  40 mg Oral Daily  . senna-docusate  1 tablet Oral BID  . sertraline  50 mg Oral Daily  . sodium chloride  1 g Oral BID WC    Continuous Infusions: . sodium chloride    . gentamicin 210 mg (05/24/19 0021)  . methocarbamol (ROBAXIN) IV    . penicillin g continuous IV infusion 12 Million Units (05/24/19 0354)     Time spent: 82mns I have personally reviewed and interpreted on  05/24/2019 daily labs,  imagings as discussed above under date review session and assessment and plans.  I reviewed all nursing notes, pharmacy notes, consultant notes,  vitals, pertinent old records  I have discussed plan of care as described above with RN , patient  on 05/24/2019   FFlorencia ReasonsMD, PhD, FACP  [facility]  Available via Epic secure  chat 7am-7pm for nonurgent issues Please page for urgent issues, pager number available through [email or web address] .   05/24/2019, 7:36 AM  LOS: 4 days

## 2019-05-24 NOTE — Progress Notes (Signed)
Patient back to unit from ultrasound. IV fluids infusing, wound vac checked.

## 2019-05-24 NOTE — Progress Notes (Addendum)
[facility] for Infectious Disease    Date of Admission:  05/19/2019   Total days of antibiotics 6           ID: Luke [name] is a 39 y.o. male with   Principal Problem:   Drug abuse, IV ([place]) Active Problems:   Tobacco abuse   Essential hypertension   Abscess   CKD (chronic kidney disease) stage 3, GFR 30-59 ml/min   Cocaine abuse ([name])   Uncontrolled insulin dependent type 1 diabetes mellitus ([place])   Diabetic polyneuropathy associated with type 1 diabetes mellitus ([name])   Hepatitis C   Hypothyroidism   Sepsis ([place])   Heroin abuse ([name])   Acute bacterial endocarditis   Endocarditis   Abscess of right forearm    Subjective: afebrile  Medications:   atorvastatin  20 mg Oral Daily   carvedilol  6.25 mg Oral BID WC   Chlorhexidine Gluconate Cloth  6 each Topical Daily   DULoxetine  30 mg Oral Daily   enoxaparin (LOVENOX) injection  40 mg Subcutaneous Daily   gabapentin  400 mg Oral TID   insulin aspart  0-15 Units Subcutaneous TID WC   insulin aspart  3 Units Subcutaneous TID WC   insulin glargine  30 Units Subcutaneous BID   ketorolac  15 mg Intravenous Q6H   levothyroxine  50 mcg Oral Daily   lidocaine  1 patch Transdermal Q24H   pantoprazole  40 mg Oral Daily   senna-docusate  1 tablet Oral BID   sertraline  50 mg Oral Daily   sodium bicarbonate  650 mg Oral TID    Objective: Vital signs in last 24 hours: Temp:  [97.6 F (36.4 C)-98.7 F (37.1 C)] 98.7 F (37.1 C) (03/07 1254) Pulse Rate:  [82-95] 91 (03/07 1254) Resp:  [18] 18 (03/07 1254) BP: (118-152)/(82-92) 152/82 (03/07 1254) SpO2:  [99 %-100 %] 100 % (03/07 1254) Physical Exam  Constitutional: He is oriented to person, place, and time. He appears well-developed and well-nourished. No distress.  HENT:  Mouth/Throat: Oropharynx is clear and moist. No oropharyngeal exudate.  Cardiovascular: Normal rate, regular rhythm and normal heart sounds. Exam reveals no gallop and no  friction rub.  No murmur heard.  Pulmonary/Chest: Effort normal and breath sounds normal. No respiratory distress. He has no wheezes.  Abdominal: Soft. Bowel sounds are normal. He exhibits no distension. There is no tenderness.  Lymphadenopathy:  He has no cervical adenopathy.  Neurological: He is alert and oriented to person, place, and time.  Skin: Skin is warm and dry. No rash noted. No erythema.  Psychiatric: He has a normal mood and affect. His behavior is normal.     Lab Results Recent Labs    05/23/19 0125 05/24/19 0316  WBC 16.8* 13.0*  HGB 7.8* 7.4*  HCT 25.4* 24.0*  NA 128* 137  K 4.4 4.6  CL 99 108  CO2 20* 21*  BUN 15 16  CREATININE 1.72* 2.17*   Liver Panel Recent Labs    05/23/19 0125 05/24/19 0316  PROT 5.7* 5.4*  ALBUMIN 2.2* 2.1*  AST 18 16  ALT 21 19  ALKPHOS 103 92  BILITOT 0.3 0.4   Sedimentation Rate No results for input(s): ESRSEDRATE in the last 72 hours. C-Reactive Protein No results for input(s): CRP in the last 72 hours.  Microbiology: reviewed Studies/Results: MR THORACIC SPINE W WO CONTRAST  Result Date: 05/23/2019 CLINICAL DATA:  Low back pain, bacteremia EXAM: MRI THORACIC AND LUMBAR SPINE  WITHOUT AND WITH CONTRAST TECHNIQUE: Multiplanar and multiecho pulse sequences of the thoracic and lumbar spine were obtained without and with intravenous contrast. CONTRAST:  12mL GADAVIST GADOBUTROL 1 MMOL/ML IV SOLN COMPARISON:  None. FINDINGS: MRI THORACIC SPINE There is some motion artifact present. Alignment:  Preserved kyphosis.  No significant listhesis. Vertebrae: Chronic loss of vertebral body height at several levels my greatest at T10 where there is nearly 50% height loss. There is no evidence of osteomyelitis or discitis. Mild enhancement at the T8-T9 disc level may be on a degenerative basis as there is no corresponding edema. Cord:  No abnormal signal within the above limitation. Paraspinal and other soft tissues: Unremarkable. Disc levels:  Mild degenerative disc disease at lower thoracic levels. For example, disc bulges at T9-T10 and T11-T12. There is no significant degenerative stenosis. MRI LUMBAR SPINE Segmentation:  Standard. Alignment:  Anteroposterior alignment is maintained. Vertebrae:  There is no evidence of discitis or osteomyelitis. Conus medullaris: Extends to the L1-L2 level and appears normal. No abnormal enhancement. Paraspinal and other soft tissues: Unremarkable. Disc levels: Disc heights and signal are maintained. There is no degenerative stenosis. IMPRESSION: No evidence of osteomyelitis or discitis.  No epidural collection. Electronically Signed   By: Macy Mis M.D.   On: 05/23/2019 07:10   MR Lumbar Spine W Wo Contrast  Result Date: 05/23/2019 CLINICAL DATA:  Low back pain, bacteremia EXAM: MRI THORACIC AND LUMBAR SPINE WITHOUT AND WITH CONTRAST TECHNIQUE: Multiplanar and multiecho pulse sequences of the thoracic and lumbar spine were obtained without and with intravenous contrast. CONTRAST:  31mL GADAVIST GADOBUTROL 1 MMOL/ML IV SOLN COMPARISON:  None. FINDINGS: MRI THORACIC SPINE There is some motion artifact present. Alignment:  Preserved kyphosis.  No significant listhesis. Vertebrae: Chronic loss of vertebral body height at several levels my greatest at T10 where there is nearly 50% height loss. There is no evidence of osteomyelitis or discitis. Mild enhancement at the T8-T9 disc level may be on a degenerative basis as there is no corresponding edema. Cord:  No abnormal signal within the above limitation. Paraspinal and other soft tissues: Unremarkable. Disc levels: Mild degenerative disc disease at lower thoracic levels. For example, disc bulges at T9-T10 and T11-T12. There is no significant degenerative stenosis. MRI LUMBAR SPINE Segmentation:  Standard. Alignment:  Anteroposterior alignment is maintained. Vertebrae:  There is no evidence of discitis or osteomyelitis. Conus medullaris: Extends to the L1-L2 level and  appears normal. No abnormal enhancement. Paraspinal and other soft tissues: Unremarkable. Disc levels: Disc heights and signal are maintained. There is no degenerative stenosis. IMPRESSION: No evidence of osteomyelitis or discitis.  No epidural collection. Electronically Signed   By: Macy Mis M.D.   On: 05/23/2019 07:10   US RENAL  Result Date: 05/23/2019 CLINICAL DATA:  Renal insufficiency. EXAM: RENAL / URINARY TRACT ULTRASOUND COMPLETE COMPARISON:  Abdomen and pelvis CT dated 07/22/2017. FINDINGS: Right Kidney: Renal measurements: 10.3 x 5.4 x 5.1 cm = volume: 149 mL . Echogenicity within normal limits. No mass or hydronephrosis visualized. Left Kidney: Renal measurements: 10.2 x 5.8 x 5.7 cm = volume: 174 mL. Echogenicity within normal limits. No mass or hydronephrosis visualized. Bladder: Appears normal for degree of bladder distention. Other: None. IMPRESSION: Normal examination. Electronically Signed   By: Claudie Revering M.D.   On: 05/23/2019 20:27   DG CHEST PORT 1 VIEW  Result Date: 05/22/2019 CLINICAL DATA:  Central line placement. EXAM: PORTABLE CHEST 1 VIEW COMPARISON:  May 19, 2019 FINDINGS: Since the prior study  there is been interval placement of a left internal jugular venous catheter. Its distal tip is noted near the junction of the superior vena cava and right atrium. A trace amount of atelectasis is seen within the left lung base. There is no evidence of a pleural effusion or pneumothorax. The heart size and mediastinal contours are within normal limits. The visualized skeletal structures are unremarkable. IMPRESSION: 1. Interval left internal jugular venous catheter placement and positioning, as described above, when compared to the prior study dated May 19, 2019. Electronically Signed   By: Virgina Norfolk M.D.   On: 05/22/2019 17:17     Assessment/Plan: AKi while on gent =suspect his trough will be supratherapeutic given his worsening aki, spoke with pharmacy to get level  today to adjust dose. Also concern that he is getting NSAIDS for pain Please minimize any meds that can affect renal function. Please increase IVF  Streptococcal MV = he is on day 6 of 14 of gent and penicillin. Concern that he may not be able to finish course. Will see what we can adjust in the meantime  Right forearm abscess = continue with wound care  Chronic hep c without hepatic coma = will discuss treatment option after he finished endocarditis treatment  Hyperglycemia overnight = insulin being adjusted per primary team  [facility] for Infectious Diseases Cell: [phone] Pager: [phone]  05/24/2019, 1:11 PM

## 2019-05-24 NOTE — Progress Notes (Signed)
CBG 367. Text paged NP for orders. Patient also  noted to be eating frosted flakes and fruit loops and non complaint with advised  diet options.

## 2019-05-25 ENCOUNTER — Encounter: Payer: Self-pay | Admitting: *Deleted

## 2019-05-25 DIAGNOSIS — N1832 Chronic kidney disease, stage 3b: Secondary | ICD-10-CM

## 2019-05-25 DIAGNOSIS — E1042 Type 1 diabetes mellitus with diabetic polyneuropathy: Secondary | ICD-10-CM

## 2019-05-25 LAB — CULTURE, BLOOD (ROUTINE X 2)
Culture: NO GROWTH
Culture: NO GROWTH

## 2019-05-25 LAB — BASIC METABOLIC PANEL
Anion gap: 9 (ref 5–15)
BUN: 17 mg/dL (ref 6–20)
CO2: 22 mmol/L (ref 22–32)
Calcium: 7.9 mg/dL — ABNORMAL LOW (ref 8.9–10.3)
Chloride: 108 mmol/L (ref 98–111)
Creatinine, Ser: 1.73 mg/dL — ABNORMAL HIGH (ref 0.61–1.24)
GFR calc Af Amer: 56 mL/min — ABNORMAL LOW (ref >60)
GFR calc non Af Amer: 49 mL/min — ABNORMAL LOW (ref >60)
Glucose, Bld: 113 mg/dL — ABNORMAL HIGH (ref 70–99)
Potassium: 4 mmol/L (ref 3.5–5.1)
Sodium: 139 mmol/L (ref 135–145)

## 2019-05-25 LAB — GLUCOSE, CAPILLARY
Glucose-Capillary: 42 mg/dL — CL (ref 70–99)
Glucose-Capillary: 33 mg/dL — CL (ref 70–99)
Glucose-Capillary: 523 mg/dL (ref 70–99)
Glucose-Capillary: 55 mg/dL — ABNORMAL LOW (ref 70–99)
Glucose-Capillary: 94 mg/dL (ref 70–99)
Glucose-Capillary: 170 mg/dL — ABNORMAL HIGH (ref 70–99)
Glucose-Capillary: 443 mg/dL — ABNORMAL HIGH (ref 70–99)
Glucose-Capillary: 348 mg/dL — ABNORMAL HIGH (ref 70–99)

## 2019-05-25 LAB — CBC
HCT: 22.6 % — ABNORMAL LOW (ref 39.0–52.0)
Hemoglobin: 7 g/dL — ABNORMAL LOW (ref 13.0–17.0)
MCH: 23.5 pg — ABNORMAL LOW (ref 26.0–34.0)
MCHC: 31 g/dL (ref 30.0–36.0)
MCV: 75.8 fL — ABNORMAL LOW (ref 80.0–100.0)
Platelets: 510 10*3/uL — ABNORMAL HIGH (ref 150–400)
RBC: 2.98 MIL/uL — ABNORMAL LOW (ref 4.22–5.81)
RDW: 15.5 % (ref 11.5–15.5)
WBC: 15.2 10*3/uL — ABNORMAL HIGH (ref 4.0–10.5)
nRBC: 0 % (ref 0.0–0.2)

## 2019-05-25 LAB — GENTAMICIN LEVEL, TROUGH: Gentamicin Trough: 0.6 ug/mL (ref 0.5–2.0)

## 2019-05-25 MED ORDER — ADULT MULTIVITAMIN W/MINERALS CH
1.0000 | ORAL_TABLET | Freq: Every day | ORAL | Status: DC
  Administered 2019-05-26 – 2019-05-29 (×4): 1 via ORAL
  Filled 2019-05-25 (×4): qty 1

## 2019-05-25 MED ORDER — GENTAMICIN SULFATE 40 MG/ML IJ SOLN
3.0000 mg/kg | Freq: Once | INTRAVENOUS | Status: AC
  Administered 2019-05-25: 210 mg via INTRAVENOUS
  Filled 2019-05-25: qty 5.25

## 2019-05-25 MED ORDER — SODIUM CHLORIDE 0.9% FLUSH: 10.0000 mL | INTRAVENOUS | Status: DC | PRN

## 2019-05-25 MED ORDER — INSULIN GLARGINE 100 UNIT/ML ~~LOC~~ SOLN
24.0000 [IU] | Freq: Two times a day (BID) | SUBCUTANEOUS | Status: DC
  Administered 2019-05-25 – 2019-05-26 (×4): 24 [IU] via SUBCUTANEOUS
  Filled 2019-05-25 (×7): qty 0.24

## 2019-05-25 MED ORDER — SODIUM CHLORIDE 0.9% FLUSH
10.0000 mL | Freq: Two times a day (BID) | INTRAVENOUS | Status: DC
  Administered 2019-05-25 – 2019-05-29 (×9): 10 mL

## 2019-05-25 MED ORDER — GLUCERNA SHAKE PO LIQD: 237.0000 mL | Freq: Three times a day (TID) | ORAL | Status: DC

## 2019-05-25 NOTE — Progress Notes (Signed)
Physical Therapy Discharge [name] MRN: [id number] DOB: 04-06-1980 Today's Date: 05/25/2019 Time: 8921-1941 PT Time Calculation (min) (ACUTE ONLY): 12 min  Patient discharged from PT services secondary to goals met and no further PT needs identified.  Please see latest therapy progress note for current level of functioning and progress toward goals.    Progress and discharge plan discussed with patient and/or caregiver: Patient/Caregiver agrees with plan  GP     Denice Paradise 05/25/2019, 4:31 PM  [name],PT Acute Rehabilitation Services Pager:  [phone]  Office:  [phone]

## 2019-05-25 NOTE — Progress Notes (Addendum)
Physical Therapy Treatment and D/C [name] MRN: [id number] DOB: 1980/08/25 Today's Date: 05/25/2019    History of Present Illness 39 y.o. male was admitted 05/12/19 for TEE without cardioversion, noted acute bacterial endocarditis.  Had readmission for RUE abscess and cellulitis, had I and D on 05/22/19 with wound vac placed.  Referred to PT for mobility ck.  PMHx:  polysubstance use, IV drug use with heroin, DM, DKA, HTN, osteomyelitis ankle, L great toe amp,  2/21 TTE showed AV/MV vegetations, anemia, CKD, HA, PNA, seizures,     PT Comments    Pt admitted with above diagnosis. Pt was able to ambulate without device on unit without assist and without LOB with challenges. .  Pt does not need PT services as he is independent with mobiltiy and can walk with nursing and wife.  Declined practice of steps staying her would be fine with this and this PT agrees. D/C PT.   Follow Up Recommendations  No PT follow up     Equipment Recommendations  None recommended by PT    Recommendations for Other Services       Precautions / Restrictions Precautions Precautions: Fall Precaution Comments: wound vac RUE Restrictions Weight Bearing Restrictions: No    Mobility  Bed Mobility Overal bed mobility: Modified Independent                Transfers Overall transfer level: Needs assistance Equipment used: None Transfers: Sit to/from Stand Sit to Stand: Supervision         General transfer comment: No assist needed  Ambulation/Gait Ambulation/Gait assistance: Supervision Gait Distance (Feet): 350 Feet Assistive device: None Gait Pattern/deviations: WFL(Within Functional Limits) Gait velocity: reduced Gait velocity interpretation: >4.37 ft/sec, indicative of normal walking speed General Gait Details: Pt can accept challenges to balance and no issues. Can walk in hall with nursing.    Stairs             Wheelchair Mobility    Modified Rankin  (Stroke Patients Only)       Balance Overall balance assessment: Needs assistance Sitting-balance support: Feet supported Sitting balance-Leahy Scale: Good     Standing balance support: No upper extremity supported Standing balance-Leahy Scale: Good                   Standardized Balance Assessment Standardized Balance Assessment : Dynamic Gait Index   Dynamic Gait Index Level Surface: Normal Change in Gait Speed: Normal Gait with Horizontal Head Turns: Normal Gait with Vertical Head Turns: Normal Gait and Pivot Turn: Normal Step Over Obstacle: Normal Step Around Obstacles: Normal Steps: Normal Total Score: 24      Cognition Arousal/Alertness: Awake/alert Behavior During Therapy: WFL for tasks assessed/performed Overall Cognitive Status: Within Functional Limits for tasks assessed                                        Exercises      General Comments General comments (skin integrity, edema, etc.): No issues with gait and pt is independent. Can walk with nursing and wife.       Pertinent Vitals/Pain Pain Assessment: No/denies pain    Home Living                      Prior Function            PT Goals (current goals can  now be found in the care plan section) Acute Rehab PT Goals Patient Stated Goal: to go home Progress towards PT goals: Goals met/education completed, patient discharged from PT    Frequency    Min 3X/week      PT Plan Current plan remains appropriate    Co-evaluation              AM-PAC PT "6 Clicks" Mobility   Outcome Measure  Help needed turning from your back to your side while in a flat bed without using bedrails?: None Help needed moving from lying on your back to sitting on the side of a flat bed without using bedrails?: None Help needed moving to and from a bed to a chair (including a wheelchair)?: None Help needed standing up from a chair using your arms (e.g., wheelchair or bedside  chair)?: None Help needed to walk in hospital room?: None Help needed climbing 3-5 steps with a railing? : None 6 Click Score: 24    End of Session Equipment Utilized During Treatment: Gait belt Activity Tolerance: Patient tolerated treatment well Patient left: in bed;with call bell/phone within reach;with family/visitor present Nurse Communication: Mobility status PT Visit Diagnosis: Pain;Unsteadiness on feet (R26.81) Pain - Right/Left: Right Pain - part of body: Arm     Time: 1418-1430 PT Time Calculation (min) (ACUTE ONLY): 12 min  Charges:  $Gait Training: 8-22 mins                     Adhrit Krenz W,PT [place] Pager:  [phone]  Office:  [place] 05/25/2019, 4:29 PM

## 2019-05-25 NOTE — Progress Notes (Signed)
[facility] for Infectious Disease    Date of Admission:  05/19/2019   Total days of antibiotics 7           ID: Luke [name] is a 39 y.o. 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 atorvastatin  20 mg Oral Daily  . carvedilol  12.5 mg Oral BID WC  . Chlorhexidine Gluconate Cloth  6 each Topical Daily  . DULoxetine  30 mg Oral Daily  . enoxaparin (LOVENOX) injection  40 mg Subcutaneous Daily  . feeding supplement (GLUCERNA SHAKE)  237 mL Oral TID BM  . gabapentin  200 mg Oral TID  . gentamicin variable dose per unstable renal function (pharmacist dosing)   Does not apply See admin instructions  . insulin aspart  0-15 Units Subcutaneous TID WC  . insulin aspart  3 Units Subcutaneous TID WC  . insulin glargine  24 Units Subcutaneous BID  . levothyroxine  50 mcg Oral Daily  . lidocaine  1 patch Transdermal Q24H  . multivitamin with minerals  1 tablet Oral Daily  . pantoprazole  40 mg Oral Daily  . senna-docusate  1 tablet Oral BID  . sertraline  50 mg Oral Daily  . sodium bicarbonate  650 mg Oral TID  . sodium chloride flush  10-40 mL Intracatheter Q12H  . traZODone  50 mg Oral QHS    Objective: Vital signs in last 24 hours: Temp:  [98.4 F (36.9 C)-98.7 F (37.1 C)] 98.7 F (37.1 C) (03/08 1223) Pulse Rate:  [86-93] 93 (03/08 1223) Resp:  [16-17] 16 (03/08 1223) BP: (148-165)/(76-96) 165/96 (03/08  1223) SpO2:  [99 %-100 %] 100 % (03/08 1223) Physical Exam  Constitutional: He is oriented to person, place, and time. He appears well-developed and well-nourished. No distress.  HENT:  Mouth/Throat: Oropharynx is clear and moist. No oropharyngeal exudate.  Cardiovascular: Normal rate, regular rhythm and normal heart sounds. Exam reveals no gallop and no friction rub.  No murmur heard.  Pulmonary/Chest: Effort normal and breath sounds normal. No respiratory distress. He has no wheezes.  Abdominal: Soft. Bowel sounds are normal. He exhibits no distension. There is no tenderness.  VZD:GLOVF arm wrapped. Left arm scarring and eschar Neurological: He is alert and oriented to person, place, and time.  Skin: Skin is warm and dry. No rash noted. No erythema.  Psychiatric: He has a normal mood and affect. His behavior is normal.     Lab Results Recent Labs    05/24/19 0316 05/25/19 0337  WBC 13.0* 15.2*  HGB 7.4* 7.0*  HCT 24.0* 22.6*  NA 137 139  K 4.6 4.0  CL 108 108  CO2 21* 22  BUN 16 17  CREATININE 2.17* 1.73*   Liver Panel Recent Labs    05/23/19 0125 05/24/19 0316  PROT 5.7* 5.4*  ALBUMIN 2.2* 2.1*  AST 18 16  ALT 21 19  ALKPHOS 103 92  BILITOT 0.3 0.4   Sedimentation Rate No results for input(s): ESRSEDRATE in the last 72 hours. C-Reactive Protein No results for input(s): CRP in the last 72 hours.  Microbiology: reviewed Studies/Results: US RENAL  Result Date: 05/23/2019 CLINICAL DATA:  Renal insufficiency. EXAM: RENAL / URINARY TRACT ULTRASOUND COMPLETE COMPARISON:  Abdomen and pelvis CT dated 07/22/2017. FINDINGS: Right Kidney: Renal measurements: 10.3 x 5.4 x 5.1 cm = volume: 149 mL . Echogenicity within normal limits. No mass or hydronephrosis visualized. Left Kidney: Renal measurements: 10.2 x 5.8 x 5.7 cm = volume: 174 mL. Echogenicity within normal limits. No mass or hydronephrosis visualized. Bladder: Appears normal for degree of bladder distention. Other:  None. IMPRESSION: Normal examination. Electronically Signed   By: Claudie Revering M.D.   On: 05/23/2019 20:27     Assessment/Plan: Leukocytosis = unclear why it has not trended down. Recommend left forearm mri or CT to evaluate for deep tissue infection  mv endocarditis = currently day 7 of 14 for gentamicin and penicillin  TDM = appears to be clearing gentamicin appropriately. Will continue with pharmacy dosing  aki = likely from [place]. Avoid nsaids while being on gentamicin  [facility] for Infectious Diseases Cell: [phone] Pager: [phone]  05/25/2019, 6:27 PM

## 2019-05-25 NOTE — Plan of Care (Signed)

## 2019-05-25 NOTE — Anesthesia Postprocedure Evaluation (Signed)
Anesthesia Post Note  [name]  Procedure(s) Performed: IRRIGATION AND DEBRIDEMENT RIGHT ARM (Right )     Patient location during evaluation: PACU Anesthesia Type: General Level of consciousness: awake and alert Pain management: pain level controlled Vital Signs Assessment: post-procedure vital signs reviewed and stable Respiratory status: spontaneous breathing, nonlabored ventilation, respiratory function stable and patient connected to nasal cannula oxygen Cardiovascular status: blood pressure returned to baseline and stable Postop Assessment: no apparent nausea or vomiting Anesthetic complications: no    Last Vitals:  Vitals:   05/25/19 0503 05/25/19 1223  BP: (!) 151/82 (!) 165/96  Pulse: 86 93  Resp: 17 16  Temp: 36.9 C 37.1 C  SpO2: 99% 100%    Last Pain:  Vitals:   05/25/19 1338  TempSrc:   PainSc: 8                  Gil Ingwersen COKER

## 2019-05-25 NOTE — Progress Notes (Signed)
Pharmacy Antibiotic Note  Luke [name] is a 39 y.o. male admitted on 05/19/2019 with streptococcal MV.  Pharmacy has been consulted for gentamicin dosing.  Gent trough <1 but seems to be accumulating as previous trough was undetectable, now 0.6.  Plan: Will give one dose of gentamicin 210mg  IV and continue to monitor SCr +/- gent levels.  Temp (24hrs), Avg:98.3 F (36.8 C), Min:97.6 F (36.4 C), Max:98.7 F (37.1 C)  Recent Labs  Lab 05/19/19 2138 05/19/19 2138 05/20/19 0208 05/20/19 0955 05/21/19 0616 05/22/19 0616 05/23/19 0030 05/23/19 0125 05/24/19 0316 05/25/19 0004  WBC 22.1*  --   --   --  16.0* 15.0*  --  16.8* 13.0*  --   CREATININE 1.49*   < >  --  1.20 1.28* 1.51*  --  1.72* 2.17*  --   LATICACIDVEN  --   --  1.0  --   --   --   --   --   --   --   GENTTROUGH  --   --   --   --   --   --  <0.5*  --   --  0.6   < > = values in this interval not displayed.    Estimated Creatinine Clearance: 50.7 mL/min (A) (by C-G formula based on SCr of 2.17 mg/dL (H)).    No Known Allergies   Thank you for allowing pharmacy to be a part of this patient's care.  Rogue Bussing 05/25/2019 1:37 AM

## 2019-05-25 NOTE — Discharge Instructions (Signed)

## 2019-05-25 NOTE — Progress Notes (Signed)
PROGRESS NOTE    Luke [name]  MEB:[id number]  DOB: 1980-05-08  PCP: Gildardo Pounds, NP Admit date:05/19/2019  39 y.o.malewith PMHpolysubstance use, IVDU with heroin, DM1, HTN, prior osteomyelitis of the ankle s/p left great toe amputation, bilateral upper extremity abscesses who returns for completion of endocarditis treatment.  Patient recently admitted on 2/20 for sepsis secondary to endocarditis and ultimately left AMA on 2/27 prior to completion of IV abx treatment. He reports  family convinced him to come back and complete treatment. He admits to heroin use the day PTA. ED Course: Afebrile, tachycardic with elevated blood pressure otherwise afebrile and stable. Labs: K 3.4, glucose 93, Cr 1.49, WBC 22.1, Hgb 9.9, Trop 20>25, ESR 62. Hospital course: Patient admitted to [facility] for continuation of prior IV abx treatment.S/p irrigation and debridement multiple abscesses right forearmon 3/5, has a wound vac on. HC also complicated by hyperglycemia and hypoglycemia, diabetes diet noncompliance   Subjective:  Patient sitting comfortably and eating lunch with girlfriend at bedside. Denies any acute complaints  Objective: Vitals:   05/24/19 1700 05/25/19 0027 05/25/19 0503 05/25/19 1223  BP: 140/89 (!) 148/76 (!) 151/82 (!) 165/96  Pulse: 89 92 86 93  Resp: '18 16 17 16  '$ Temp: 98.4 F (36.9 C) 98.4 F (36.9 C) 98.5 F (36.9 C) 98.7 F (37.1 C)  TempSrc: Oral Oral Oral Oral  SpO2: 100% 99% 99% 100%  Weight:      Height:        Intake/Output Summary (Last 24 hours) at 05/25/2019 1754 Last data filed at 05/25/2019 0636 Gross per 24 hour  Intake 2346.07 ml  Output 2350 ml  Net -3.93 ml   Filed Weights   05/19/19 2126 05/22/19 1443  Weight: 90 kg 90 kg    Physical Examination:  General exam: Appears calm and comfortable  Respiratory system: Clear to auscultation. Respiratory effort normal. Cardiovascular system: S1 & S2 heard, RRR. No JVD, murmurs, rubs, gallops or clicks.  No pedal edema. Gastrointestinal system: Abdomen is nondistended, soft and nontender. Normal bowel sounds heard. Central nervous system: Alert and oriented. No new focal neurological deficits. Extremities: No contractures, edema or joint deformities.  Skin: No rashes, lesions or ulcers Psychiatry: Judgement and insight appear normal. Mood & affect appropriate.   Data Reviewed: I have personally reviewed following labs and imaging studies  CBC: Recent Labs  Lab 05/21/19 0616 05/22/19 0616 05/23/19 0125 05/24/19 0316 05/25/19 0337  WBC 16.0* 15.0* 16.8* 13.0* 15.2*  HGB 9.2* 9.4* 7.8* 7.4* 7.0*  HCT 29.7* 29.7* 25.4* 24.0* 22.6*  MCV 74.1* 74.1* 77.2* 76.9* 75.8*  PLT 544* 521* 496* 494* 808*   Basic Metabolic Panel: Recent Labs  Lab 05/21/19 0616 05/22/19 0616 05/23/19 0125 05/24/19 0316 05/25/19 0337  NA 132* 129* 128* 137 139  K 4.7 4.7 4.4 4.6 4.0  CL 98 98 99 108 108  CO2 22 21* 20* 21* 22  GLUCOSE 292* 495* 585* 338* 113*  BUN '8 12 15 16 17  '$ CREATININE 1.28* 1.51* 1.72* 2.17* 1.73*  CALCIUM 8.6* 8.9 7.9* 8.2* 7.9*   GFR: Estimated Creatinine Clearance: 63.6 mL/min (A) (by C-G formula based on SCr of 1.73 mg/dL (H)). Liver Function Tests: Recent Labs  Lab 05/20/19 0955 05/21/19 0616 05/22/19 0616 05/23/19 0125 05/24/19 0316  AST '19 20 24 18 16  '$ ALT '25 25 25 21 19  '$ ALKPHOS 119 118 120 103 92  BILITOT 0.5 0.7 0.5 0.3 0.4  PROT 6.3* 6.5 6.5 5.7* 5.4*  ALBUMIN  2.5* 2.5* 2.6* 2.2* 2.1*   No results for input(s): LIPASE, AMYLASE in the last 168 hours. No results for input(s): AMMONIA in the last 168 hours. Coagulation Profile: Recent Labs  Lab 05/19/19 2138  INR 0.9   Cardiac Enzymes: No results for input(s): CKTOTAL, CKMB, CKMBINDEX, TROPONINI in the last 168 hours. BNP (last 3 results) No results for input(s): PROBNP in the last 8760 hours. HbA1C: No results for input(s): HGBA1C in the last 72 hours. CBG: Recent Labs  Lab 05/24/19 2147  05/25/19 0629 05/25/19 0657 05/25/19 1107 05/25/19 1558  GLUCAP 117* 55* 94 170* 443*   Lipid Profile: No results for input(s): CHOL, HDL, LDLCALC, TRIG, CHOLHDL, LDLDIRECT in the last 72 hours. Thyroid Function Tests: No results for input(s): TSH, T4TOTAL, FREET4, T3FREE, THYROIDAB in the last 72 hours. Anemia Panel: No results for input(s): VITAMINB12, FOLATE, FERRITIN, TIBC, IRON, RETICCTPCT in the last 72 hours. Sepsis Labs: Recent Labs  Lab 05/20/19 0208  LATICACIDVEN 1.0    Recent Results (from the past 240 hour(s))  Culture, blood (routine x 2)     Status: None   Collection Time: 05/20/19  8:10 AM   Specimen: BLOOD RIGHT ARM  Result Value Ref Range Status   Specimen Description BLOOD RIGHT ARM  Final   Special Requests   Final    BOTTLES DRAWN AEROBIC ONLY Blood Culture results may not be optimal due to an inadequate volume of blood received in culture bottles   Culture   Final    NO GROWTH 5 DAYS Performed at [facility], [place] [address], [place], [place] [address]    Report Status 05/25/2019 FINAL  Final  Culture, blood (routine x 2)     Status: None   Collection Time: 05/20/19  8:10 AM   Specimen: BLOOD RIGHT ARM  Result Value Ref Range Status   Specimen Description BLOOD RIGHT ARM  Final   Special Requests   Final    BOTTLES DRAWN AEROBIC ONLY Blood Culture results may not be optimal due to an inadequate volume of blood received in culture bottles   Culture   Final    NO GROWTH 5 DAYS Performed at [facility], [place] [address] [place], [place] [address]    Report Status 05/25/2019 FINAL  Final  MRSA PCR Screening     Status: None   Collection Time: 05/22/19  1:56 PM   Specimen: Nasal Mucosa; Nasopharyngeal  Result Value Ref Range Status   MRSA by PCR NEGATIVE NEGATIVE Final    Comment:        The GeneXpert MRSA Assay (FDA approved for NASAL specimens only), is one component of a comprehensive MRSA colonization surveillance program. It is  not intended to diagnose MRSA infection nor to guide or monitor treatment for MRSA infections. Performed at [facility], [place] [address] [place], [place] [address]   Aerobic/Anaerobic Culture (surgical/deep wound)     Status: None (Preliminary result)   Collection Time: 05/22/19  4:00 PM   Specimen: Wound; Abscess  Result Value Ref Range Status   Specimen Description ABSCESS  Final   Special Requests RT ARM  Final   Gram Stain   Final    FEW WBC PRESENT, PREDOMINANTLY PMN NO ORGANISMS SEEN    Culture   Final    NO GROWTH 3 DAYS NO ANAEROBES ISOLATED; CULTURE IN PROGRESS FOR 5 DAYS Performed at [facility] [address] [place], [place] [address]    Report  Status PENDING  Incomplete  Culture, Urine     Status: None   Collection Time: 05/23/19  2:03 PM   Specimen: Urine, Clean Catch  Result Value Ref Range Status   Specimen Description URINE, CLEAN CATCH  Final   Special Requests NONE  Final   Culture   Final    NO GROWTH Performed at [facility], [address] [place], [place] [address]    Report Status 05/24/2019 FINAL  Final      Radiology Studies: US RENAL  Result Date: 05/23/2019 CLINICAL DATA:  Renal insufficiency. EXAM: RENAL / URINARY TRACT ULTRASOUND COMPLETE COMPARISON:  Abdomen and pelvis CT dated 07/22/2017. FINDINGS: Right Kidney: Renal measurements: 10.3 x 5.4 x 5.1 cm = volume: 149 mL . Echogenicity within normal limits. No mass or hydronephrosis visualized. Left Kidney: Renal measurements: 10.2 x 5.8 x 5.7 cm = volume: 174 mL. Echogenicity within normal limits. No mass or hydronephrosis visualized. Bladder: Appears normal for degree of bladder distention. Other: None. IMPRESSION: Normal examination. Electronically Signed   By: Claudie Revering M.D.   On: 05/23/2019 20:27        Scheduled Meds: . atorvastatin  20 mg Oral Daily  . carvedilol  12.5 mg Oral BID WC  . Chlorhexidine Gluconate Cloth  6 each Topical Daily  . DULoxetine  30  mg Oral Daily  . enoxaparin (LOVENOX) injection  40 mg Subcutaneous Daily  . feeding supplement (GLUCERNA SHAKE)  237 mL Oral TID BM  . gabapentin  200 mg Oral TID  . gentamicin variable dose per unstable renal function (pharmacist dosing)   Does not apply See admin instructions  . insulin aspart  0-15 Units Subcutaneous TID WC  . insulin aspart  3 Units Subcutaneous TID WC  . insulin glargine  24 Units Subcutaneous BID  . levothyroxine  50 mcg Oral Daily  . lidocaine  1 patch Transdermal Q24H  . multivitamin with minerals  1 tablet Oral Daily  . pantoprazole  40 mg Oral Daily  . senna-docusate  1 tablet Oral BID  . sertraline  50 mg Oral Daily  . sodium bicarbonate  650 mg Oral TID  . sodium chloride flush  10-40 mL Intracatheter Q12H  . traZODone  50 mg Oral QHS   Continuous Infusions: . sodium chloride 10 mL/hr at 05/25/19 1500  . methocarbamol (ROBAXIN) IV    . penicillin g continuous IV infusion 12 Million Units (05/25/19 1717)   Assessment/Plan:  1.  Aortic/Mitral Valve infective endocarditis, in setting of IVDU; Streptococcus mitis bacteremia with sepsis POA: Tachycardic on admission with WBC 22.1. Repeat blood culture no growth, WBC 13, Continue gent and penicillin (day 7/14). ID input appreciated, follow recommendation. Pharmacy following gentamycin levels  2. Right forearm abscesses-s/p irrigation and debridement multiple abscesses right forearm with wound vac placement on 3/5, surgical wound culture in process-Post op he was started on toradol , this is discontinued due to aki-Ortho input appreciated  3.AKI on CKD 3A-Creatinine trend up to 1.75, he is on gentamicin, get UA cloudy with rare bacteria. Renal usno hydronephrosis. Continue renal dosing meds (hold lisinopril),discontinue Toradol,decrease Neurontin dose,pharmacy to check gentamicin level ,start gentle hydration with normal saline 75 cc/h for 24 hours,monitor volume status,is now +4 L already  4.  Hyponatremia, Metabolic acidosis,start sodium bicarb supplement. Sodium normalized now.   5.Thrombocytosis:Likely reactive, repeat CBC, trending down from 724 to 494  6.Anemia of chronic disease : in the setting of CKD.     7. Insulin-dependent type  2 diabetes, uncontrolled, with hyperglycemia and hypoglycemia-A1c 10.9. IV antibiotics carrier fluids is dextrose,-Also has diet noncompliance, RN reports "Patient alsonoted to be eating frosted flakes and fruit loops"-Diabetes diet education-Currently on Lantus 30 units twice a day,NovoLog 3 units meal coverage and SSI-hypoglycemic this morning and per Diab educator, changed qhs to 24 units,continue hypoglycemia protocol  8. Hypertension, hold home medication lisinopril and Norvasc Started Coreg and titrate up, he has tendency to have tachycardia, Cr elevated  9. Hyperlipidemia ,continue statin  10. Hypothyroidism, continue Synthroid  11. UpperBack pain, MRI " No evidence of osteomyelitis or discitis. No epidural collection."Chronic loss of vertebral body height at several levels my greatest at T10 where there is nearly 50% height loss.correlate with h/o back injury when he was 22, he reports has been having upper back pain since he was 54. Continue conservative measures: k pad, prn dilaudid  12. Hepatitis c : follow up with ID  13. Polysubstance abuse-Education provided, patient states he is ready to quit. Currently on dilaudid prn, he is not interested in methadone or Suboxone,he stated he is not in withdrawal,he is not craving    DVT prophylaxis: lovenox Code Status: full code Family / Patient Communication: d/w patient girlfriend at bedside Disposition Plan:   Patient is from home prior to hospitalization. Received/Receiving inpatient care for acute aortic/mitral valve endocarditis Discharge when finishes IV abx course -March 15.     LOS: 5 days    Time spent: 25 minutes    Guilford Shi, MD [facility] Pager in [place]  If 7PM-7AM, please contact night-coverage [email or web address] 05/25/2019, 5:54 PM

## 2019-05-25 NOTE — Progress Notes (Signed)
Initial Nutrition Assessment  RD working remotely.  DOCUMENTATION CODES:   Not applicable  INTERVENTION:   -MVI with minerals daily -Double protein portions with meals -Glucerna Shake po TID, each supplement provides 220 kcal and 10 grams of protein -Provided "Carbohydrate Counting for People with Diabetes" handout from [facility]'s Nutrition Care Manual; attached to AVS/ discharge summary  NUTRITION DIAGNOSIS:   Increased nutrient needs related to wound healing, post-op healing as evidenced by estimated needs.  GOAL:   Patient will meet greater than or equal to 90% of their needs  MONITOR:   PO intake, Supplement acceptance, Diet advancement, Labs, Weight trends, Skin, I & O's  REASON FOR ASSESSMENT:   Consult Diet education  ASSESSMENT:   Luke [name] is a 39 y.o. 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   3/5- s/p I&D of rt arm with multiple abscesses, local tissue rearrangement for wound closure, and application of wound vac  Reviewed I/O's: +525 ml x 24 hours and +4.6 L since admission  UOP: 3.1 L x 24 hours  Drain output: 50 ml x 24 hours  Attempted to speak with pt via phone, however, no answer.   Pt admitted to hospital previously on 05/09/19 and left AMA on 05/16/19. He has been readmitted to finish course of IV antibiotics.  Pt with good appetite; noted meal completion 100%. Per chart review, RN concerned about diet noncompliance, as pt has been eating frosted flakes and fruit loops.   Reviewed wt hx; wt has been stable over the past 9 months.   Pt with increased nutrient needs due to post-operative healing and endocarditis; would benefit from addition of oral nutritional  supplements, as pt would be unable to meet nutritional needs from house carb modified diet alone.   Medications reviewed and include 0.9% sodium chloride @ 10 ml/hr.   Lab Results  Component Value Date   HGBA1C 10.9 (H) 05/20/2019   PTA DM medications are 40 units insulin glargine BID and 4-20 units insulin lispro TID.   Labs reviewed: CBGS: 55-170 (inpatient orders for glycemic control are 0-15 units insulin aspart TID with meals, 3 units insulin glargine TID with meals, and 24 units insulin glargine BID).   Diet Order:   Diet Order            Diet Carb Modified Fluid consistency: Thin; Room service appropriate? Yes  Diet effective now              EDUCATION NEEDS:   No education needs have been identified at this time  Skin:  Skin Assessment: Skin Integrity Issues: Skin Integrity Issues:: Wound VAC Wound Vac: rt arm  Last BM:  05/25/19  Height:   Ht Readings from Last 1 Encounters:  05/22/19 5\' 9"  (1.753 m)    Weight:   Wt Readings from Last 1 Encounters:  05/22/19 90 kg    Ideal Body Weight:  72.7 kg  BMI:  Body mass index is 29.3 kg/m.  Estimated Nutritional Needs:   Kcal:  2200-2400  Protein:  110-125 grams  Fluid:  > 2.2L    Loistine Chance, RD, LDN, [place] Registered Dietitian II Certified Diabetes Care and Education Specialist Please refer to [facility] for RD and/or RD on-call/weekend/after hours pager

## 2019-05-25 NOTE — Progress Notes (Signed)
Inpatient Diabetes Program Recommendations  AACE/ADA: New Consensus Statement on Inpatient Glycemic Control (2015)  Target Ranges:  Prepandial:   less than 140 mg/dL      Peak postprandial:   less than 180 mg/dL (1-2 hours)      Critically ill patients:  140 - 180 mg/dL   Lab Results  Component Value Date   GLUCAP 94 05/25/2019   HGBA1C 10.9 (H) 05/20/2019    Review of Glycemic Control Results for Luke [name], Luke [name] (MRN [id number]) as of 05/25/2019 09:26  Ref. Range 05/24/2019 21:47 05/25/2019 06:29 05/25/2019 06:57  Glucose-Capillary Latest Ref Range: 70 - 99 mg/dL 117 (H) 55 (L) 94   Diabetes history: DM1 Outpatient Diabetes medications: Lantus 40 units BID + Humalog 4-20 units TID +  Current orders for Inpatient glycemic control: Lantus 30 units BID + Novolog 0-15 TID + Novolog 3 units TID with meals  Inpatient Diabetes Program Recommendations:     Noted hypoglycemia this am of 55 mg/dL. At this time consider:  -Decreasing Lantus QHS dose to 24 units.    Thanks, Bronson Curb, MSN, RNC-OB Diabetes Coordinator [phone] (8a-5p)

## 2019-05-25 NOTE — Progress Notes (Signed)
POD 3 Iand D Right Forearm. Feels well. Moving fingers. Pain controlled  Plan: Follow up Dr Sharol Given 1 week for vac removel

## 2019-05-26 LAB — GLUCOSE, CAPILLARY
Glucose-Capillary: 209 mg/dL — ABNORMAL HIGH (ref 70–99)
Glucose-Capillary: 321 mg/dL — ABNORMAL HIGH (ref 70–99)
Glucose-Capillary: 151 mg/dL — ABNORMAL HIGH (ref 70–99)
Glucose-Capillary: 370 mg/dL — ABNORMAL HIGH (ref 70–99)

## 2019-05-26 LAB — HEMOGLOBIN AND HEMATOCRIT, BLOOD
HCT: 22.8 % — ABNORMAL LOW (ref 39.0–52.0)
Hemoglobin: 7.3 g/dL — ABNORMAL LOW (ref 13.0–17.0)

## 2019-05-26 LAB — BASIC METABOLIC PANEL
Anion gap: 8 (ref 5–15)
BUN: 14 mg/dL (ref 6–20)
CO2: 23 mmol/L (ref 22–32)
Calcium: 8.4 mg/dL — ABNORMAL LOW (ref 8.9–10.3)
Chloride: 106 mmol/L (ref 98–111)
Creatinine, Ser: 1.53 mg/dL — ABNORMAL HIGH (ref 0.61–1.24)
GFR calc Af Amer: >60 mL/min (ref >60)
GFR calc non Af Amer: 56 mL/min — ABNORMAL LOW (ref >60)
Glucose, Bld: 279 mg/dL — ABNORMAL HIGH (ref 70–99)
Potassium: 4.1 mmol/L (ref 3.5–5.1)
Sodium: 137 mmol/L (ref 135–145)

## 2019-05-26 LAB — GENTAMICIN LEVEL, TROUGH: Gentamicin Trough: 0.5 ug/mL (ref 0.5–2.0)

## 2019-05-26 MED ORDER — OXYCODONE HCL 5 MG PO TABS
5.0000 mg | ORAL_TABLET | Freq: Four times a day (QID) | ORAL | Status: DC | PRN
  Administered 2019-05-26 – 2019-05-29 (×7): 5 mg via ORAL
  Filled 2019-05-26 (×7): qty 1

## 2019-05-26 MED ORDER — GENTAMICIN SULFATE 40 MG/ML IJ SOLN
3.0000 mg/kg | INTRAVENOUS | Status: DC
  Administered 2019-05-26 – 2019-05-29 (×4): 210 mg via INTRAVENOUS
  Filled 2019-05-26 (×6): qty 5.25

## 2019-05-26 MED ORDER — INSULIN ASPART 100 UNIT/ML ~~LOC~~ SOLN
4.0000 [IU] | Freq: Three times a day (TID) | SUBCUTANEOUS | Status: DC
  Administered 2019-05-26 – 2019-05-29 (×9): 4 [IU] via SUBCUTANEOUS

## 2019-05-26 MED ORDER — HYDROMORPHONE HCL 2 MG PO TABS
1.0000 mg | ORAL_TABLET | Freq: Three times a day (TID) | ORAL | Status: DC | PRN
  Administered 2019-05-26 – 2019-05-28 (×5): 1 mg via ORAL
  Filled 2019-05-26 (×5): qty 1

## 2019-05-26 NOTE — Plan of Care (Signed)

## 2019-05-26 NOTE — Progress Notes (Signed)
Pharmacy Antibiotic Note  Luke [name] is a 39 y.o. male admitted on 05/19/2019 with streptococcal MV.  Pharmacy has been consulted for gentamicin dosing.  Gent trough 0.5 mcg/ml  Plan: Continue gentamicin 210mg  IV q24 hours and continue to monitor SCr  Temp (24hrs), Avg:98.5 F (36.9 C), Min:98.4 F (36.9 C), Max:98.7 F (37.1 C)  Recent Labs  Lab 05/20/19 0208 05/20/19 0955 05/21/19 0616 05/22/19 0616 05/23/19 0030 05/23/19 0125 05/24/19 0316 05/25/19 0004 05/25/19 0337 05/26/19 0146  WBC  --   --  16.0* 15.0*  --  16.8* 13.0*  --  15.2*  --   CREATININE  --    < > 1.28* 1.51*  --  1.72* 2.17*  --  1.73*  --   LATICACIDVEN 1.0  --   --   --   --   --   --   --   --   --   GENTTROUGH  --   --   --   --    < >  --   --  0.6  --  0.5   < > = values in this interval not displayed.    Estimated Creatinine Clearance: 63.6 mL/min (A) (by C-G formula based on SCr of 1.73 mg/dL (H)).    No Known Allergies   Thank you for allowing pharmacy to be a part of this patient's care.  [name] 05/26/2019 3:08 AM

## 2019-05-26 NOTE — Progress Notes (Addendum)
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  Patient recently admitted on 2/20 for sepsis secondary to endocarditis and ultimately left AMA on 2/27 prior to completion of IV abx treatment. He reports  family convinced him to come back and complete treatment. He admits to heroin use the day PTA. ED Course: Afebrile, tachycardic with elevated blood pressure otherwise afebrile and stable. Labs: K 3.4, glucose 93, Cr 1.49, WBC 22.1, Hgb 9.9, Trop 20>25, ESR 62. Hospital course: Patient admitted to [facility] for continuation of prior IV abx treatment.S/p irrigation and debridement multiple abscesses right forearmon 3/5, has a wound vac on. HC also complicated by hyperglycemia and hypoglycemia, diabetes diet noncompliance   Subjective:  Patient appears comfortable. Denies any acute complaints.  He is agreeable to reducing Dilaudid frequency.  Objective: Vitals:   05/25/19 1223 05/25/19 2314 05/26/19 0533 05/26/19 1153  BP: (!) 165/96 137/75 137/85 116/64  Pulse: 93 84 78 83  Resp: '16 18 16 16  '$ Temp: 98.7 F (37.1 C) 98.4 F (36.9 C) 97.9 F (36.6 C) 97.9 F (36.6 C)  TempSrc: Oral Oral Oral Oral  SpO2: 100% 100% 99% 99%  Weight:      Height:        Intake/Output Summary (Last 24 hours) at 05/26/2019 1619 Last data filed at 05/26/2019 1217 Gross per 24 hour  Intake 1519.15 ml  Output 2400 ml  Net -880.85 ml   Filed Weights   05/19/19 2126 05/22/19 1443  Weight: 90 kg 90 kg    Physical Examination:  General exam: Appears calm and comfortable  Respiratory system: Clear to auscultation. Respiratory effort normal. Cardiovascular system: S1 & S2 heard, RRR. No JVD, murmurs, rubs, gallops or clicks.  No pedal edema. Gastrointestinal system: Abdomen is nondistended, soft and nontender. Normal bowel sounds heard. Central nervous system: Alert and oriented. No new focal neurological deficits. Extremities: No contractures, edema or joint deformities.  Skin: No rashes, lesions or ulcers Psychiatry: Judgement and insight appear normal. Mood & affect appropriate.   Data Reviewed: I have personally reviewed following labs and imaging studies  CBC: Recent Labs  Lab 05/21/19 0616 05/22/19 0616 05/23/19 0125 05/24/19 0316 05/25/19 0337  WBC 16.0* 15.0* 16.8* 13.0* 15.2*  HGB 9.2* 9.4* 7.8* 7.4* 7.0*  HCT 29.7* 29.7* 25.4* 24.0* 22.6*  MCV 74.1* 74.1* 77.2* 76.9* 75.8*  PLT 544* 521* 496* 494* 915*   Basic Metabolic Panel: Recent Labs  Lab 05/22/19 0616 05/23/19 0125 05/24/19 0316 05/25/19 0337 05/26/19 0410  NA 129* 128* 137 139 137  K 4.7 4.4 4.6 4.0 4.1  CL 98 99 108 108 106  CO2 21* 20* 21* 22 23  GLUCOSE 495* 585* 338* 113* 279*  BUN '12 15 16 17 14  '$ CREATININE 1.51* 1.72* 2.17* 1.73* 1.53*  CALCIUM 8.9 7.9* 8.2* 7.9* 8.4*   GFR: Estimated Creatinine Clearance: 71.9 mL/min (A) (by C-G formula based on SCr of 1.53 mg/dL (H)). Liver Function Tests: Recent Labs  Lab 05/20/19 0955 05/21/19 0616 05/22/19 0616 05/23/19 0125 05/24/19 0316  AST '19 20 24 18 16  '$ ALT '25 25 25 21 19  '$ ALKPHOS 119 118 120 103 92  BILITOT 0.5 0.7 0.5 0.3 0.4  PROT 6.3* 6.5 6.5 5.7* 5.4*  ALBUMIN 2.5*  2.5* 2.6* 2.2* 2.1*   No results for input(s): LIPASE, AMYLASE in the last 168 hours. No results for input(s): AMMONIA in the last 168 hours. Coagulation Profile: Recent Labs  Lab 05/19/19 2138  INR 0.9   Cardiac Enzymes: No results for input(s): CKTOTAL, CKMB, CKMBINDEX, TROPONINI in the last 168 hours. BNP (last 3 results) No results for input(s): PROBNP in the last 8760 hours. HbA1C: No results for input(s): HGBA1C in the last 72 hours. CBG: Recent Labs  Lab 05/25/19 1107  05/25/19 1558 05/25/19 2123 05/26/19 0619 05/26/19 1118  GLUCAP 170* 443* 348* 209* 321*   Lipid Profile: No results for input(s): CHOL, HDL, LDLCALC, TRIG, CHOLHDL, LDLDIRECT in the last 72 hours. Thyroid Function Tests: No results for input(s): TSH, T4TOTAL, FREET4, T3FREE, THYROIDAB in the last 72 hours. Anemia Panel: No results for input(s): VITAMINB12, FOLATE, FERRITIN, TIBC, IRON, RETICCTPCT in the last 72 hours. Sepsis Labs: Recent Labs  Lab 05/20/19 0208  LATICACIDVEN 1.0    Recent Results (from the past 240 hour(s))  Culture, blood (routine x 2)     Status: None   Collection Time: 05/20/19  8:10 AM   Specimen: BLOOD RIGHT ARM  Result Value Ref Range Status   Specimen Description BLOOD RIGHT ARM  Final   Special Requests   Final    BOTTLES DRAWN AEROBIC ONLY Blood Culture results may not be optimal due to an inadequate volume of blood received in culture bottles   Culture   Final    NO GROWTH 5 DAYS Performed at [facility], [place] [address] [place], [place] [address]    Report Status 05/25/2019 FINAL  Final  Culture, blood (routine x 2)     Status: None   Collection Time: 05/20/19  8:10 AM   Specimen: BLOOD RIGHT ARM  Result Value Ref Range Status   Specimen Description BLOOD RIGHT ARM  Final   Special Requests   Final    BOTTLES DRAWN AEROBIC ONLY Blood Culture results may not be optimal due to an inadequate volume of blood received in culture bottles   Culture   Final    NO GROWTH 5 DAYS Performed at [facility], [place] [address], [place], [place] [address]    Report Status 05/25/2019 FINAL  Final  MRSA PCR Screening     Status: None   Collection Time: 05/22/19  1:56 PM   Specimen: Nasal Mucosa; Nasopharyngeal  Result Value Ref Range Status   MRSA by PCR NEGATIVE NEGATIVE Final    Comment:        The GeneXpert MRSA Assay (FDA approved for NASAL specimens only), is one component of a comprehensive MRSA colonization surveillance program. It  is not intended to diagnose MRSA infection nor to guide or monitor treatment for MRSA infections. Performed at [facility], [place] [address] [place], [place] [address]   Aerobic/Anaerobic Culture (surgical/deep wound)     Status: None (Preliminary result)   Collection Time: 05/22/19  4:00 PM   Specimen: Wound; Abscess  Result Value Ref Range Status   Specimen Description ABSCESS  Final   Special Requests RT ARM  Final   Gram Stain   Final    FEW WBC PRESENT, PREDOMINANTLY PMN NO ORGANISMS SEEN    Culture   Final    NO GROWTH 4 DAYS Performed at [facility], [address] [place] [address], [place], [place] [address]    Report Status PENDING  Incomplete  Culture, Urine  Status: None   Collection Time: 05/23/19  2:03 PM   Specimen: Urine, Clean Catch  Result Value Ref Range Status   Specimen Description URINE, CLEAN CATCH  Final   Special Requests NONE  Final   Culture   Final    NO GROWTH Performed at [facility], [address] [place], [place] [address]    Report Status 05/24/2019 FINAL  Final      Radiology Studies: No results found.      Scheduled Meds: . atorvastatin  20 mg Oral Daily  . carvedilol  12.5 mg Oral BID WC  . Chlorhexidine Gluconate Cloth  6 each Topical Daily  . DULoxetine  30 mg Oral Daily  . enoxaparin (LOVENOX) injection  40 mg Subcutaneous Daily  . feeding supplement (GLUCERNA SHAKE)  237 mL Oral TID BM  . gabapentin  200 mg Oral TID  . gentamicin variable dose per unstable renal function (pharmacist dosing)   Does not apply See admin instructions  . insulin aspart  0-15 Units Subcutaneous TID WC  . insulin aspart  3 Units Subcutaneous TID WC  . insulin glargine  24 Units Subcutaneous BID  . levothyroxine  50 mcg Oral Daily  . lidocaine  1 patch Transdermal Q24H  . multivitamin with minerals  1 tablet Oral Daily  . pantoprazole  40 mg Oral Daily  . senna-docusate  1 tablet Oral BID  . sertraline  50 mg Oral Daily  . sodium  chloride flush  10-40 mL Intracatheter Q12H  . traZODone  50 mg Oral QHS   Continuous Infusions: . sodium chloride 10 mL/hr at 05/25/19 1500  . gentamicin Stopped (05/26/19 1208)  . methocarbamol (ROBAXIN) IV    . penicillin g continuous IV infusion 12 Million Units (05/26/19 0513)   Assessment/Plan:  1.  Aortic/Mitral Valve infective endocarditis, in setting of IVDU; Streptococcus mitis bacteremia with sepsis POA: Tachycardic on admission with WBC 22.1. Repeat blood culture no growth, WBC 13, Continue gent and penicillin (day 7/14). ID input appreciated, follow recommendation. Pharmacy following gentamycin levels  2. Right forearm abscesses-s/p irrigation and debridement multiple abscesses right forearm with wound vac placement on 3/5, surgical wound culture negative so far-Post op he was started on toradol , this is discontinued due to aki-Ortho input appreciated. Currently on oxycodone/dilaudid prn  3.AKI on CKD 3A-Creatinine trended up to 2.1 during the hospital course while on gentamicin,  UA cloudy with rare bacteria. Renal usno hydronephrosis. Continue renal dosing meds (hold lisinopril),discontinued Toradol,decreased Neurontin dose,pharmacy following gentamicin level.  Patient received gentle hydration with normal saline 75 cc/h for 24 hours and creatinine appears to be downtrending.  Currently off IV fluids.  4. Hyponatremia, Metabolic acidosis,started on sodium bicarb supplement. Sodium normalized now and acidosis resolved with bicarb at 21.  Reduce frequency of sodium bicarb supplement, monitor daily labs.Marland Kitchen   5.Thrombocytosis:Likely reactive, repeat CBC, trending down from 724 to 494  6.Anemia of chronic disease : in the setting of CKD.  Hemoglobin greater than 9 on admission but been downtrending in the last 2 to 3 days.  Hemoglobin at 7.0 yesterday, repeat today.  No signs of active bleeding.  Could be hemolytic from problem #1.  May need blood transfusion. Hold  Lovenox  7. Insulin-dependent type 2 diabetes, uncontrolled, with hyperglycemia and hypoglycemia-A1c 10.9. IV antibiotics carrier fluids is dextrose,-Also has diet noncompliance, RN reports "Patient alsonoted to be eating frosted flakes and fruit loops"-Diabetes diet education-Currently on Lantus 30 units twice a day,NovoLog 3 units meal  coverage and SSI-hypoglycemic this morning and per Diab educator, changed qhs to 24 units,continue hypoglycemia protocol  8. Hypertension, hold home medication lisinopril and Norvasc. Started Coreg and titrated up during the hospital course, he has tendency to have tachycardia  9. Hyperlipidemia ,continue statin  10. Hypothyroidism, continue Synthroid  11. UpperBack pain, MRI " No evidence of osteomyelitis or discitis. No epidural collection."Chronic loss of vertebral body height at several levels my greatest at T10 where there is nearly 50% height loss.correlate with h/o back injury when he was 46, he reports has been having upper back pain since he was 54. Continue conservative measures: k pad, been on prn dilaudid-reduced frequency-taper to off given history of polysubstance use.  12. Hepatitis c : follow up with ID  13. Polysubstance abuse-Education provided, patient states he is ready to quit. Currently on oxycodone/dilaudid prn, he is not interested in methadone or Suboxone,he stated he is not in withdrawal,he is not craving.  Reduced oxycodone dose. Taper Dilaudid to off over next few days.    DVT prophylaxis:  Hold Lovenox as ambulatory and hemoglobin downtrending Code Status: full code Family / Patient Communication: d/w patient girlfriend at bedside Disposition Plan:   Patient is from home prior to hospitalization. Received/Receiving inpatient care for acute aortic/mitral valve endocarditis, electrolyte abnormalities,AKI,  anemia Discharge when finishes IV abx course -March 15.     LOS: 6 days    Time spent: 25  minutes    [place] Shi, MD [facility] Pager in [place]  If 7PM-7AM, please contact night-coverage [email or web address] 05/26/2019, 4:19 PM

## 2019-05-27 LAB — CBC
HCT: 24.1 % — ABNORMAL LOW (ref 39.0–52.0)
Hemoglobin: 7.3 g/dL — ABNORMAL LOW (ref 13.0–17.0)
MCH: 23.2 pg — ABNORMAL LOW (ref 26.0–34.0)
MCHC: 30.3 g/dL (ref 30.0–36.0)
MCV: 76.5 fL — ABNORMAL LOW (ref 80.0–100.0)
Platelets: 519 10*3/uL — ABNORMAL HIGH (ref 150–400)
RBC: 3.15 MIL/uL — ABNORMAL LOW (ref 4.22–5.81)
RDW: 15.9 % — ABNORMAL HIGH (ref 11.5–15.5)
WBC: 13 10*3/uL — ABNORMAL HIGH (ref 4.0–10.5)
nRBC: 0 % (ref 0.0–0.2)

## 2019-05-27 LAB — BASIC METABOLIC PANEL
Anion gap: 9 (ref 5–15)
BUN: 17 mg/dL (ref 6–20)
CO2: 22 mmol/L (ref 22–32)
Calcium: 8.4 mg/dL — ABNORMAL LOW (ref 8.9–10.3)
Chloride: 104 mmol/L (ref 98–111)
Creatinine, Ser: 1.49 mg/dL — ABNORMAL HIGH (ref 0.61–1.24)
GFR calc Af Amer: >60 mL/min (ref >60)
GFR calc non Af Amer: 58 mL/min — ABNORMAL LOW (ref >60)
Glucose, Bld: 376 mg/dL — ABNORMAL HIGH (ref 70–99)
Potassium: 4 mmol/L (ref 3.5–5.1)
Sodium: 135 mmol/L (ref 135–145)

## 2019-05-27 LAB — AEROBIC/ANAEROBIC CULTURE W GRAM STAIN (SURGICAL/DEEP WOUND): Culture: NO GROWTH

## 2019-05-27 LAB — GLUCOSE, CAPILLARY
Glucose-Capillary: 291 mg/dL — ABNORMAL HIGH (ref 70–99)
Glucose-Capillary: 284 mg/dL — ABNORMAL HIGH (ref 70–99)
Glucose-Capillary: 294 mg/dL — ABNORMAL HIGH (ref 70–99)
Glucose-Capillary: 407 mg/dL — ABNORMAL HIGH (ref 70–99)

## 2019-05-27 MED ORDER — INSULIN GLARGINE 100 UNIT/ML ~~LOC~~ SOLN
30.0000 [IU] | Freq: Two times a day (BID) | SUBCUTANEOUS | Status: DC
  Administered 2019-05-27 – 2019-05-29 (×5): 30 [IU] via SUBCUTANEOUS
  Filled 2019-05-27 (×6): qty 0.3

## 2019-05-27 NOTE — Progress Notes (Signed)
Doing well VSS comfortable   Vac no new drainage. Vac removed. Dry dressing applied. Will need to follow up with dr. Sharol Given 1 week after discharge

## 2019-05-27 NOTE — Progress Notes (Signed)
[facility]    Date of Admission:  05/19/2019   Total days of antibiotics 9   ID: Luke [name] is a 39 y.o. male with   Principal Problem:   Drug abuse, IV ([place]) Active Problems:   Tobacco abuse   Essential hypertension   Abscess   CKD (chronic kidney disease) stage 3, GFR 30-59 ml/min   Cocaine abuse ([place])   Uncontrolled insulin dependent type 1 diabetes mellitus ([place])   Diabetic polyneuropathy associated with type 1 diabetes mellitus ([name])   Hepatitis C   Hypothyroidism   Sepsis ([place])   Heroin abuse ([place])   Acute bacterial endocarditis   Endocarditis   Abscess of right forearm    Subjective: Afebrile. No diarrhea. No abdominal pain. No drainage from right arm.  Medications:  . atorvastatin  20 mg Oral Daily  . carvedilol  12.5 mg Oral BID WC  . Chlorhexidine Gluconate Cloth  6 each Topical Daily  . DULoxetine  30 mg Oral Daily  . feeding supplement (GLUCERNA SHAKE)  237 mL Oral TID BM  . gabapentin  200 mg Oral TID  . insulin aspart  0-15 Units Subcutaneous TID WC  . insulin aspart  4 Units Subcutaneous TID WC  . insulin glargine  30 Units Subcutaneous BID  . levothyroxine  50 mcg Oral Daily  . lidocaine  1 patch Transdermal Q24H  . multivitamin with minerals  1 tablet Oral Daily  . pantoprazole  40 mg Oral Daily  . senna-docusate  1 tablet Oral BID  . sertraline  50 mg Oral Daily  . sodium chloride flush  10-40 mL Intracatheter Q12H  . traZODone  50 mg Oral QHS    Objective: Vital signs in last 24 hours: Temp:  [98 F (36.7 C)-98.7 F (37.1 C)] 98.7 F (37.1 C) (03/10 1239) Pulse Rate:  [81-90] 90 (03/10 1239) Resp:  [16-18] 16 (03/10 1239) BP: (126-158)/(62-92) 133/68 (03/10 1239) SpO2:  [98 %-100 %] 98 % (03/10 1239) Physical Exam  Constitutional: He is oriented to person, place, and time. He appears well-developed and well-nourished. No distress.  HENT:  Mouth/Throat: Oropharynx is clear and moist. No oropharyngeal  exudate.  Cardiovascular: Normal rate, regular rhythm and normal heart sounds. Exam reveals no gallop and no friction rub.  No murmur heard.  Pulmonary/Chest: Effort normal and breath sounds normal. No respiratory distress. He has no wheezes.  Abdominal: Soft. Bowel sounds are normal. He exhibits no distension. There is no tenderness.  HYI:FOYDX arm wrapped Neurological: He is alert and oriented to person, place, and time.  Skin: Skin is warm and dry. No rash noted. No erythema.  Psychiatric: He has a normal mood and affect. His behavior is normal.   .  Lab Results Recent Labs    05/25/19 0337 05/25/19 0337 05/26/19 0410 05/26/19 1822 05/27/19 0500 05/27/19 0600  WBC 15.2*  --   --   --   --  13.0*  HGB 7.0*   < >  --  7.3*  --  7.3*  HCT 22.6*   < >  --  22.8*  --  24.1*  NA 139   < > 137  --  135  --   K 4.0   < > 4.1  --  4.0  --   CL 108   < > 106  --  104  --   CO2 22   < > 23  --  22  --   BUN  17   < > 14  --  17  --   CREATININE 1.73*   < > 1.53*  --  1.49*  --    < > = values in this interval not displayed.   Liver Panel No results for input(s): PROT, ALBUMIN, AST, ALT, ALKPHOS, BILITOT, BILIDIR, IBILI in the last 72 hours. Sedimentation Rate No results for input(s): ESRSEDRATE in the last 72 hours. C-Reactive Protein No results for input(s): CRP in the last 72 hours.  Microbiology:  Studies/Results: No results found.   Assessment/Plan: Streptococcal MV endocarditis = currently on day 9 of 14 of penicillin and gent. Will finish infusion on Monday evening to complete course  Right arm abscess s/p debridement = wound vac removed today. Recommend to follow wound care recs for remaining healing process  ckd = at baseline with monitoring therapeutic level for gentamicin  Anemia = hgb low over the last few days. Recommend to do work up for anemia. Possibly would need iron infusion if iron levels are low.  [facility]  Cell: [phone] Pager: [phone]  05/27/2019, 3:12 PM
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
## 2019-05-28 LAB — CBC WITH DIFFERENTIAL/PLATELET
Abs Immature Granulocytes: 0.06 10*3/uL (ref 0.00–0.07)
Basophils Absolute: 0.1 10*3/uL (ref 0.0–0.1)
Basophils Relative: 1 %
Eosinophils Absolute: 0.3 10*3/uL (ref 0.0–0.5)
Eosinophils Relative: 2 %
HCT: 25.5 % — ABNORMAL LOW (ref 39.0–52.0)
Hemoglobin: 7.7 g/dL — ABNORMAL LOW (ref 13.0–17.0)
Immature Granulocytes: 1 %
Lymphocytes Relative: 25 %
Lymphs Abs: 3.3 10*3/uL (ref 0.7–4.0)
MCH: 23.4 pg — ABNORMAL LOW (ref 26.0–34.0)
MCHC: 30.2 g/dL (ref 30.0–36.0)
MCV: 77.5 fL — ABNORMAL LOW (ref 80.0–100.0)
Monocytes Absolute: 1.1 10*3/uL — ABNORMAL HIGH (ref 0.1–1.0)
Monocytes Relative: 8 %
Neutro Abs: 8.3 10*3/uL — ABNORMAL HIGH (ref 1.7–7.7)
Neutrophils Relative %: 63 %
Platelets: 571 10*3/uL — ABNORMAL HIGH (ref 150–400)
RBC: 3.29 MIL/uL — ABNORMAL LOW (ref 4.22–5.81)
RDW: 16.1 % — ABNORMAL HIGH (ref 11.5–15.5)
WBC: 13.2 10*3/uL — ABNORMAL HIGH (ref 4.0–10.5)
nRBC: 0 % (ref 0.0–0.2)

## 2019-05-28 LAB — BASIC METABOLIC PANEL
Anion gap: 9 (ref 5–15)
BUN: 18 mg/dL (ref 6–20)
CO2: 22 mmol/L (ref 22–32)
Calcium: 8.7 mg/dL — ABNORMAL LOW (ref 8.9–10.3)
Chloride: 107 mmol/L (ref 98–111)
Creatinine, Ser: 1.71 mg/dL — ABNORMAL HIGH (ref 0.61–1.24)
GFR calc Af Amer: 57 mL/min — ABNORMAL LOW (ref >60)
GFR calc non Af Amer: 49 mL/min — ABNORMAL LOW (ref >60)
Glucose, Bld: 236 mg/dL — ABNORMAL HIGH (ref 70–99)
Potassium: 3.8 mmol/L (ref 3.5–5.1)
Sodium: 138 mmol/L (ref 135–145)

## 2019-05-28 LAB — URIC ACID: Uric Acid, Serum: 3.9 mg/dL (ref 3.7–8.6)

## 2019-05-28 LAB — GLUCOSE, CAPILLARY
Glucose-Capillary: 150 mg/dL — ABNORMAL HIGH (ref 70–99)
Glucose-Capillary: 213 mg/dL — ABNORMAL HIGH (ref 70–99)
Glucose-Capillary: 324 mg/dL — ABNORMAL HIGH (ref 70–99)
Glucose-Capillary: 168 mg/dL — ABNORMAL HIGH (ref 70–99)
Glucose-Capillary: 262 mg/dL — ABNORMAL HIGH (ref 70–99)

## 2019-05-28 LAB — FERRITIN: Ferritin: 77 ng/mL (ref 24–336)

## 2019-05-28 LAB — IRON AND TIBC
Iron: 31 ug/dL — ABNORMAL LOW (ref 45–182)
Saturation Ratios: 10 % — ABNORMAL LOW (ref 17.9–39.5)
TIBC: 301 ug/dL (ref 250–450)
UIBC: 270 ug/dL

## 2019-05-28 LAB — LACTATE DEHYDROGENASE: LDH: 135 U/L (ref 98–192)

## 2019-05-28 MED ORDER — SODIUM CHLORIDE 0.9 % IV SOLN
1000.0000 mg | Freq: Once | INTRAVENOUS | Status: AC
  Administered 2019-05-28: 1000 mg via INTRAVENOUS
  Filled 2019-05-28: qty 20

## 2019-05-28 MED ORDER — INSULIN ASPART 100 UNIT/ML ~~LOC~~ SOLN
12.0000 [IU] | Freq: Once | SUBCUTANEOUS | Status: AC
  Administered 2019-05-28: 12 [IU] via SUBCUTANEOUS

## 2019-05-28 MED ORDER — HYDROMORPHONE HCL 2 MG PO TABS
1.0000 mg | ORAL_TABLET | Freq: Two times a day (BID) | ORAL | Status: DC | PRN
  Administered 2019-05-28: 1 mg via ORAL
  Filled 2019-05-28 (×2): qty 1

## 2019-05-28 MED ORDER — IRON DEXTRAN 50 MG/ML IJ SOLN: 0.4000 mg/kg | Freq: Once | INTRAMUSCULAR | Status: DC

## 2019-05-28 MED ORDER — SODIUM CHLORIDE 0.9 % IV SOLN
25.0000 mg | Freq: Once | INTRAVENOUS | Status: AC
  Administered 2019-05-28: 25 mg via INTRAVENOUS
  Filled 2019-05-28: qty 0.5

## 2019-05-28 NOTE — Progress Notes (Addendum)
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/2018 Strep viridans Streptococcus + DKA 03/19/2019 left AGAINST MEDICAL ADVICE He had a televisit that he initiated 04/20/2019 he was notified of blood cultures from 12/31 On TEE 05/12/2019 found to have vegetations 1.0 4X1.5 anterior mitral leaflet + filamentous lesion posterior leaflet On admission WBC 20, heart rate 122 given saline Orthopedics and infectious disease consulted Underwent surgery 05/22/2019 irrigation debridement right arm wound VAC was placed then removed Has PICC line in place-ID has been following recommending gentamicin and penicillin through 3/15  Data Reviewed:  BUN/creatinine 17/1.4-->18/1.7  White count down from 15.2-->13.0-->13.2 Hemoglobin baseline 9.4 05/22/2019-->7.3-->7.7 Iron level 31 saturation ratio 10 ferritin 77-LDH 135 uric acid 3.9 haptoglobin pending  Assessment & Plan:  Abscesses status post I&D debridement and washout 05/22/2019 with wound VAC Defer further management to Dr. Tonia Brooms examined 3/10 and looked clean Continue gentamicin, ampicillin until 3/15--complete treatment in hospital Wound cultures performed Intra-Op NGTD Transitioning off IV Dilaudid 1 mg -->q12-rounding physician to change ONLY -no opiates on d/c Heroin abuse disorder Prior use Suboxone-patient to seek Suboxone clinic and seek reengagement Viridans Streptococcus 12/31, Streptococcus mitis 2/19 2/2 bottles--note clearance of blood 05/20/2019 no growth x2 As above discussion per ID Acute kidney injury Resolved-KVO IV saline Microcytic anemia secondary to acute blood loss anemia poor  nutrition Giving IV iron today and repeat in 1 week-check reticulocytes in 4 days and hemoglobin in 3 to 4 weeks Cocaine and polysubstance habituation Medical noncompliance Counseled to remain in hospital for further work-up and completion of regular treatment DM TY 2 poorly controlled A1c this admission 10.9 CBGs 150-407, continue Lantus 30 units continue 4 units 3 times daily meals and moderate sliding scale coverage Continue gabapentin 200 3 times daily as well as duloxetine DR 30 daily Depression Continue trazodone 50, sertraline 50  Subjective:  Awake alert no new issue passing urine No dark stools reported--last one was brown 1 day ago No f, chills rigors etc  Consultants:   ID  Orthopedics  Objective: Vitals:   05/27/19 1239 05/27/19 1817 05/27/19 2319 05/28/19 0452  BP: 133/68 (!) 154/78 132/78 (!) 150/95  Pulse: 90 95 85 83  Resp: 16 16 18 18   Temp: 98.7 F (37.1 C) 98.8 F (37.1 C) 98.1 F (36.7 C) 97.9 F (36.6 C)  TempSrc: Oral Oral Oral Oral  SpO2: 98% 99% 100% 99%  Weight:      Height:        Intake/Output Summary (Last 24 hours) at 05/28/2019 0801 Last data filed at 05/28/2019 0600 Gross per 24 hour  Intake 2375.21 ml  Output 1850 ml  Net 525.21 ml   Filed Weights   05/19/19 2126 05/22/19 1443  Weight: 90 kg 90 kg     Examination:  eomi ncat no focal deficit RUE is wrapped up but no tenderness to hyperextension at wrist Sensory intact Ct ab no added sound Neuro grossly intact   Scheduled Meds: . atorvastatin  20 mg Oral Daily  . carvedilol  12.5 mg Oral BID WC  . Chlorhexidine Gluconate Cloth  6 each Topical Daily  . DULoxetine  30 mg Oral Daily  . feeding supplement (GLUCERNA SHAKE)  237 mL Oral  TID BM  . gabapentin  200 mg Oral TID  . insulin aspart  0-15 Units Subcutaneous TID WC  . insulin aspart  4 Units Subcutaneous TID WC  . insulin glargine  30 Units Subcutaneous BID  . levothyroxine  50 mcg Oral Daily  . lidocaine  1 patch  Transdermal Q24H  . multivitamin with minerals  1 tablet Oral Daily  . pantoprazole  40 mg Oral Daily  . senna-docusate  1 tablet Oral BID  . sertraline  50 mg Oral Daily  . sodium chloride flush  10-40 mL Intracatheter Q12H  . traZODone  50 mg Oral QHS   Continuous Infusions: . sodium chloride 10 mL/hr at 05/25/19 1500  . gentamicin 210 mg (05/28/19 0438)  . penicillin g continuous IV infusion 12 Million Units (05/28/19 0555)     LOS: 8 days   Time spent: [place], MD [facility]  05/28/2019, 8:01 AM

## 2019-05-28 NOTE — Progress Notes (Signed)
PIV consult discussed with nurse, Ala. Confirmed incompatible meds may be given via double lumen CVC. Will cancel consult.

## 2019-05-29 LAB — BASIC METABOLIC PANEL
Anion gap: 10 (ref 5–15)
BUN: 17 mg/dL (ref 6–20)
CO2: 20 mmol/L — ABNORMAL LOW (ref 22–32)
Calcium: 8.3 mg/dL — ABNORMAL LOW (ref 8.9–10.3)
Chloride: 108 mmol/L (ref 98–111)
Creatinine, Ser: 1.61 mg/dL — ABNORMAL HIGH (ref 0.61–1.24)
GFR calc Af Amer: >60 mL/min (ref >60)
GFR calc non Af Amer: 53 mL/min — ABNORMAL LOW (ref >60)
Glucose, Bld: 276 mg/dL — ABNORMAL HIGH (ref 70–99)
Potassium: 4.1 mmol/L (ref 3.5–5.1)
Sodium: 138 mmol/L (ref 135–145)

## 2019-05-29 LAB — GENTAMICIN LEVEL, TROUGH: Gentamicin Trough: <0.5 ug/mL — ABNORMAL LOW (ref 0.5–2.0)

## 2019-05-29 LAB — HAPTOGLOBIN: Haptoglobin: 286 mg/dL (ref 17–317)

## 2019-05-29 LAB — GLUCOSE, CAPILLARY
Glucose-Capillary: 115 mg/dL — ABNORMAL HIGH (ref 70–99)
Glucose-Capillary: 256 mg/dL — ABNORMAL HIGH (ref 70–99)

## 2019-05-29 MED ORDER — LANTUS SOLOSTAR 100 UNIT/ML ~~LOC~~ SOPN: PEN_INJECTOR | SUBCUTANEOUS | 6 refills | Status: DC

## 2019-05-29 MED ORDER — FERROUS SULFATE 325 (65 FE) MG PO TABS: 325.0000 mg | ORAL_TABLET | Freq: Every day | ORAL | 3 refills | Status: DC

## 2019-05-29 MED ORDER — POLYETHYLENE GLYCOL 3350 17 G PO PACK: 17.0000 g | PACK | Freq: Every day | ORAL | 0 refills | Status: DC | PRN

## 2019-05-29 MED ORDER — CARVEDILOL 12.5 MG PO TABS: 12.5000 mg | ORAL_TABLET | Freq: Two times a day (BID) | ORAL | 3 refills | Status: DC

## 2019-05-29 MED ORDER — TRAZODONE HCL 50 MG PO TABS: 50.0000 mg | ORAL_TABLET | Freq: Every day | ORAL | 2 refills | Status: DC

## 2019-05-29 MED ORDER — ORITAVANCIN DIPHOSPHATE 400 MG IV SOLR
1200.0000 mg | Freq: Once | INTRAVENOUS | Status: AC
  Administered 2019-05-29: 1200 mg via INTRAVENOUS
  Filled 2019-05-29: qty 120

## 2019-05-29 NOTE — Progress Notes (Signed)
Patient is discharged home on self. Patient supposed to have his Oritavancin Diphosphate (ORBACTIV) infused for 3 hours before he leaves. Right after the infusion started, patient c/o back pain and flank pain. He asked to stop the infusion and remove the line so he can leave. The attending doctor was notified and he stated to let him go if that is he wishes. Patient was given the discharge instructions and AVS and he walked out.  Patient leaved without finished the treatment that he agree to have.

## 2019-05-29 NOTE — Discharge Summary (Addendum)
Physician Discharge Summary  Luke [name] HGD:[id number] DOB: 1980-06-14 DOA: 05/19/2019  PCP: Gildardo Pounds, NP  Admit date: 05/19/2019 Discharge date: 05/29/2019  Time spent: 45 minutes  Recommendations for Outpatient Follow-up:  1. Requires outpatient close follow-up CBC differential Chem-12 about 1 week 2. Recommended follow-up at Suboxone clinic for heroin use disorder 3. Note medication changes discontinue lisinopril  4. new Coreg started trazodone and feso4 and called in meds for him 5. Recommend wet-to-dry dressings to arm and outpatient follow-up if needed orthopedics Dr. Sharol Given 6. High risk for readmission given noncompliance with recommended therapy  Discharge Diagnoses:  Principal Problem:   Drug abuse, IV ([place]) Active Problems:   Tobacco abuse   Essential hypertension   Abscess   CKD (chronic kidney disease) stage 3, GFR 30-59 ml/min   Cocaine abuse ([place])   Uncontrolled insulin dependent type 1 diabetes mellitus ([place])   Diabetic polyneuropathy associated with type 1 diabetes mellitus ([place])   Hepatitis C   Right forearm cellulitis   Hypothyroidism   Sepsis ([place])   Heroin abuse ([place])   Acute bacterial endocarditis   Endocarditis   Abscess of right forearm   Discharge Condition: Guarded  Diet recommendation: Diabetic  Filed Weights   05/19/19 2126 05/22/19 1443  Weight: 90 kg 90 kg    History of present illness:  39 male 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 MRSA diabetic foot infection status post 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/2018 Strep viridans Streptococcus + DKA 03/19/2019 left AGAINST MEDICAL ADVICE He had a televisit that he initiated 04/20/2019 he was notified of blood cultures from 12/31 On TEE 05/12/2019 found to have vegetations 1.0 4X1.5 anterior mitral leaflet + filamentous lesion posterior leaflet On admission 3/2 came in with tachycardia WBC 20, heart rate 122 given saline Orthopedics and  infectious disease consulted Underwent surgery 05/22/2019 irrigation debridement right arm wound VAC was placed then removed Has PICC line in place-ID has been following recommending gentamicin and penicillin through 3/15   Hospital Course:  Abscesses status post I&D debridement and washout 05/22/2019 with wound VAC Defer further management to Dr. Tonia Brooms examined 3/10 and looked clean-on discharge needs wet-to-dry dressings Wound cultures performed Intra-Op NGTD -no opiates on d/c Heroin abuse disorder Prior use Suboxone-patient to seek Suboxone clinic and seek reengagement Viridans Streptococcus 12/31, Streptococcus mitis 2/19 2/2 bottles--note clearance of blood 05/20/2019 no growth x2 Patient declined to stay in the hospital beyond 3/12 although completion of antibiotics Ampicill/Gent would have been 3/15-ID saw the patient and recommended off label oritavancin with the caveat that this is unproven as a therapy He was counseled to return if further worsening Acute kidney injury Resolved-KVO IV saline Microcytic anemia secondary to acute blood loss anemia poor nutrition Giving IV iron in hospital-on discharge was given p.o. FeSO4 twice daily Needs iron studies in 2 to 3 weeks Cocaine and polysubstance habituation Medical noncompliance Counseled to remain in hospital for further work-up and completion of regular treatment DM TY 2 poorly controlled A1c this admission 10.9 Brittle diabetes with erratic control 100s to 300s was resumed on Lantus twice daily instead of 40 once was placed on 30 units Resumed on home dose short acting Continue gabapentin 200 3 times daily as well as duloxetine DR 30 daily Depression Continue trazodone 50 which was started this admission and given a prescription for this Can continue Cymbalta, sertraline 50  Procedures:  Incision and drainage 05/22/2019  Consultations:  Dr. Sharol Given of orthopedics  Dr. Graylon Good infectious disease  Discharge Exam: Vitals:    05/28/19 2337 05/29/19 0446  BP: 133/76 (!) 153/91  Pulse: 90 88  Resp: 18   Temp: 97.9 F (36.6 C) 97.9 F (36.6 C)  SpO2: 100% 98%    General: Awake alert coherent Cardiovascular: S1-S2 no murmur rub or gallop Respiratory: Clinically clear no rales no rhonchi Wound not examined today had been seen earlier in the week looks stable  Discharge Instructions   Discharge Instructions    Diet - low sodium heart healthy   Complete by: As directed    Discharge instructions   Complete by: As directed    Please make sure that you follow-up if you have high fevers chills or any other issues in the outpatient setting Would recommend that you get Suboxone management in the outpatient setting for your addiction Recommend wound care with wet-to-dry dressings in your arm and follow-up with orthopedic surgery Also recommend watching your sugars and monitoring carefully Get labs in about 1 to 2 weeks primary care physician office Look at your discharge paperwork carefully as some medication such as your lisinopril has changed and the dosing of some of your insulins have changed as well we have also prescribed for you some meds to help you sleep at night   Increase activity slowly   Complete by: As directed      Allergies as of 05/29/2019   No Known Allergies     Medication List    STOP taking these medications   glucose blood test strip   lisinopril 10 MG tablet Commonly known as: ZESTRIL     TAKE these medications   acetaminophen 325 MG tablet Commonly known as: TYLENOL Take 2 tablets (650 mg total) by mouth every 6 (six) hours as needed for mild pain (or Fever >/= 101).   amLODipine 5 MG tablet Commonly known as: NORVASC Take 1 tablet (5 mg total) by mouth daily.   atorvastatin 20 MG tablet Commonly known as: LIPITOR Take 1 tablet (20 mg total) by mouth daily.   carvedilol 12.5 MG tablet Commonly known as: COREG Take 1 tablet (12.5 mg total) by mouth 2 (two) times  daily with a meal.   DULoxetine 30 MG capsule Commonly known as: CYMBALTA TAKE 1 CAPSULE (30 MG TOTAL) BY MOUTH DAILY.   ferrous sulfate 325 (65 FE) MG tablet Take 1 tablet (325 mg total) by mouth daily with breakfast.   gabapentin 400 MG capsule Commonly known as: NEURONTIN Take 1 capsule (400 mg total) by mouth 3 (three) times daily.   insulin lispro 100 UNIT/ML KwikPen Commonly known as: HumaLOG KwikPen Inject 0.04-0.2 mLs (4-20 Units total) into the skin 3 (three) times daily. Per sliding scale   Insulin Syringe-Needle U-100 31G X 5/16" 0.5 ML Misc Commonly known as: TRUEplus Insulin Syringe USE AS DIRECTED.   Lantus SoloStar 100 UNIT/ML Solostar Pen Generic drug: insulin glargine INJECT 30 UNITS INTO THE SKIN 2 (TWO) TIMES DAILY. What changed: additional instructions   levothyroxine 50 MCG tablet Commonly known as: SYNTHROID TAKE 1 TABLET (50 MCG TOTAL) BY MOUTH DAILY.   Melatonin 5 MG Chew Chew 5 mg by mouth at bedtime as needed (sleep).   omeprazole 20 MG capsule Commonly known as: PRILOSEC Take 1 capsule (20 mg total) by mouth daily.   polyethylene glycol 17 g packet Commonly known as: MIRALAX / GLYCOLAX Take 17 g by mouth daily as needed for mild constipation.   sertraline 50 MG tablet Commonly known as: ZOLOFT TAKE 1 TABLET (50 MG  TOTAL) BY MOUTH DAILY.   traZODone 50 MG tablet Commonly known as: DESYREL Take 1 tablet (50 mg total) by mouth at bedtime.   True Metrix Meter Devi 1 kit by Does not apply route as directed. Use as directed   TRUEplus Lancets 28G Misc Use as directed      No Known Allergies Follow-up Information    [name], MD In 1 week.   Specialty: Orthopedic Surgery Contact information: [place] [address] [phone]        Gildardo Pounds, NP Follow up.   Specialty: Nurse Practitioner Contact information: [place]  [address] [phone]            The results of  significant diagnostics from this hospitalization (including imaging, microbiology, ancillary and laboratory) are listed below for reference.    Significant Diagnostic Studies: DG Orthopantogram  Result Date: 05/13/2019 CLINICAL DATA:  Endocarditis of mitral valve. EXAM: ORTHOPANTOGRAM/PANORAMIC COMPARISON:  None. FINDINGS: Lucencies are noted involving tooth #12 involving the right maxilla. No obvious periapical lucencies. The patient is status post prior multiple tooth extraction. IMPRESSION: 1. Probable dental carie involving tooth #12 in the right maxilla. 2. No obvious periapical lucency. Electronically Signed   By: Constance Holster M.D.   On: 05/13/2019 15:12   DG Chest 2 View  Result Date: 05/19/2019 CLINICAL DATA:  Tachycardia, IVDU EXAM: CHEST - 2 VIEW COMPARISON:  Radiograph 05/13/2019 FINDINGS: Low volumes with streaky opacities favoring atelectasis. No consolidative opacity is seen. No pneumothorax or effusion. The cardiomediastinal contours are unremarkable. No acute osseous or soft tissue abnormality. IMPRESSION: No acute cardiopulmonary abnormality. Electronically Signed   By: Lovena Le M.D.   On: 05/19/2019 21:59   DG Chest 2 View  Result Date: 05/13/2019 CLINICAL DATA:  Endocarditis. EXAM: CHEST - 2 VIEW COMPARISON:  05/08/2019 FINDINGS: The heart size and mediastinal contours are within normal limits. Both lungs are clear. The visualized skeletal structures are unremarkable. There is a newly placed right-sided PICC line with tip terminating near the cavoatrial junction. IMPRESSION: No active cardiopulmonary disease. Electronically Signed   By: Constance Holster M.D.   On: 05/13/2019 15:13   DG Chest 2 View  Result Date: 05/08/2019 CLINICAL DATA:  Suspected sepsis. EXAM: CHEST - 2 VIEW COMPARISON:  12/17/2016 FINDINGS: The heart size and mediastinal contours are within normal limits. Both lungs are clear. The visualized skeletal structures are unremarkable. There is stable  height loss of the T11 vertebral body. There is stable height loss T10 vertebral body. IMPRESSION: No active cardiopulmonary disease. Electronically Signed   By: Constance Holster M.D.   On: 05/08/2019 22:29   MR THORACIC SPINE W WO CONTRAST  Result Date: 05/23/2019 CLINICAL DATA:  Low back pain, bacteremia EXAM: MRI THORACIC AND LUMBAR SPINE WITHOUT AND WITH CONTRAST TECHNIQUE: Multiplanar and multiecho pulse sequences of the thoracic and lumbar spine were obtained without and with intravenous contrast. CONTRAST:  45m GADAVIST GADOBUTROL 1 MMOL/ML IV SOLN COMPARISON:  None. FINDINGS: MRI THORACIC SPINE There is some motion artifact present. Alignment:  Preserved kyphosis.  No significant listhesis. Vertebrae: Chronic loss of vertebral body height at several levels my greatest at T10 where there is nearly 50% height loss. There is no evidence of osteomyelitis or discitis. Mild enhancement at the T8-T9 disc level may be on a degenerative basis as there is no corresponding edema. Cord:  No abnormal signal within the above limitation. Paraspinal and other soft tissues: Unremarkable. Disc levels: Mild degenerative disc  disease at lower thoracic levels. For example, disc bulges at T9-T10 and T11-T12. There is no significant degenerative stenosis. MRI LUMBAR SPINE Segmentation:  Standard. Alignment:  Anteroposterior alignment is maintained. Vertebrae:  There is no evidence of discitis or osteomyelitis. Conus medullaris: Extends to the L1-L2 level and appears normal. No abnormal enhancement. Paraspinal and other soft tissues: Unremarkable. Disc levels: Disc heights and signal are maintained. There is no degenerative stenosis. IMPRESSION: No evidence of osteomyelitis or discitis.  No epidural collection. Electronically Signed   By: Macy Mis M.D.   On: 05/23/2019 07:10   MR Lumbar Spine W Wo Contrast  Result Date: 05/23/2019 CLINICAL DATA:  Low back pain, bacteremia EXAM: MRI THORACIC AND LUMBAR SPINE WITHOUT  AND WITH CONTRAST TECHNIQUE: Multiplanar and multiecho pulse sequences of the thoracic and lumbar spine were obtained without and with intravenous contrast. CONTRAST:  49m GADAVIST GADOBUTROL 1 MMOL/ML IV SOLN COMPARISON:  None. FINDINGS: MRI THORACIC SPINE There is some motion artifact present. Alignment:  Preserved kyphosis.  No significant listhesis. Vertebrae: Chronic loss of vertebral body height at several levels my greatest at T10 where there is nearly 50% height loss. There is no evidence of osteomyelitis or discitis. Mild enhancement at the T8-T9 disc level may be on a degenerative basis as there is no corresponding edema. Cord:  No abnormal signal within the above limitation. Paraspinal and other soft tissues: Unremarkable. Disc levels: Mild degenerative disc disease at lower thoracic levels. For example, disc bulges at T9-T10 and T11-T12. There is no significant degenerative stenosis. MRI LUMBAR SPINE Segmentation:  Standard. Alignment:  Anteroposterior alignment is maintained. Vertebrae:  There is no evidence of discitis or osteomyelitis. Conus medullaris: Extends to the L1-L2 level and appears normal. No abnormal enhancement. Paraspinal and other soft tissues: Unremarkable. Disc levels: Disc heights and signal are maintained. There is no degenerative stenosis. IMPRESSION: No evidence of osteomyelitis or discitis.  No epidural collection. Electronically Signed   By: PMacy MisM.D.   On: 05/23/2019 07:10   CT FOREARM RIGHT W CONTRAST  Result Date: 05/15/2019 CLINICAL DATA:  History of IV drug use, forearm pain EXAM: CT OF THE UPPER RIGHT EXTREMITY WITH CONTRAST TECHNIQUE: Multidetector CT imaging of the upper right extremity was performed according to the standard protocol following intravenous contrast administration. COMPARISON:  None. CONTRAST:  1018mOMNIPAQUE IOHEXOL 300 MG/ML  SOLN FINDINGS: Bones/Joint/Cartilage No fracture or dislocation. No periosteal reaction or cortical destruction is  seen. The joint spaces appear to be well maintained. Ligaments Suboptimally assessed by CT. Muscles and Tendons There is heterogeneous edema seen within the anterior(volar) medial muscular compartment of the forearm at the midshaft of the ulna. The muscles are otherwise intact. Limited visualization of the tendons, however the portions that are visualized are intact. Soft tissues At the anterior medial aspect of the forearm at the level of the midshaft of the ulna there are 3 separate multilocular peripherally enhancing fluid collections within the subcutaneous soft tissues the first measures 1.3 x 1.2 by 1.5 cm and best seen on series 5, image 55. The second also at the anteromedial aspect of the proximal ulnar shaft slightly more posterior measuring 1.5 x 1.1 by 2.1 cm best seen on series 5, image 61. The third multilocular collection is at the proximal anteromedial aspect of the ulnar shaft and measuring approximately a 2.8 x 0.8 x 1.6 cm best seen on series 5, image 80. There is diffuse subcutaneous edema seen along the proximal to mid forearm with diffuse skin thickening  and overlying skin irregularity. IMPRESSION: At least 3 separate multilocular soft tissue abscesses along the anteromedial aspect of the proximal to mid forearm as described above within the subcutaneous soft tissues. Overlying cellulitis and skin thickening. No definite evidence of osteomyelitis. Electronically Signed   By: Prudencio Pair M.D.   On: 05/15/2019 19:17   US RENAL  Result Date: 05/23/2019 CLINICAL DATA:  Renal insufficiency. EXAM: RENAL / URINARY TRACT ULTRASOUND COMPLETE COMPARISON:  Abdomen and pelvis CT dated 07/22/2017. FINDINGS: Right Kidney: Renal measurements: 10.3 x 5.4 x 5.1 cm = volume: 149 mL . Echogenicity within normal limits. No mass or hydronephrosis visualized. Left Kidney: Renal measurements: 10.2 x 5.8 x 5.7 cm = volume: 174 mL. Echogenicity within normal limits. No mass or hydronephrosis visualized. Bladder:  Appears normal for degree of bladder distention. Other: None. IMPRESSION: Normal examination. Electronically Signed   By: Claudie Revering M.D.   On: 05/23/2019 20:27   DG CHEST PORT 1 VIEW  Result Date: 05/22/2019 CLINICAL DATA:  Central line placement. EXAM: PORTABLE CHEST 1 VIEW COMPARISON:  May 19, 2019 FINDINGS: Since the prior study there is been interval placement of a left internal jugular venous catheter. Its distal tip is noted near the junction of the superior vena cava and right atrium. A trace amount of atelectasis is seen within the left lung base. There is no evidence of a pleural effusion or pneumothorax. The heart size and mediastinal contours are within normal limits. The visualized skeletal structures are unremarkable. IMPRESSION: 1. Interval left internal jugular venous catheter placement and positioning, as described above, when compared to the prior study dated May 19, 2019. Electronically Signed   By: Virgina Norfolk M.D.   On: 05/22/2019 17:17   ECHOCARDIOGRAM COMPLETE  Result Date: 05/10/2019    ECHOCARDIOGRAM REPORT   Patient Name:   KASHON KRAYNAK Date of Exam: 05/09/2019 Medical Rec #:  [id number]    Height:       69.0 in Accession #:    [id number]   Weight:       176.4 lb Date of Birth:  06-09-1980    BSA:          1.958 m Patient Age:    89 years     BP:           144/90 mmHg Patient Gender: M            HR:           116 bpm. Exam Location:  Inpatient Procedure: 2D Echo Indications:    Endocarditis I38  History:        Patient has prior history of Echocardiogram examinations. Risk                 Factors:Diabetes, Hypertension and Current Smoker. Drug abuse.                 CKD. Sepsis. Herion abuse.  Sonographer:    Clayton Lefort RDCS (AE) Referring Phys: [place]  1. Left ventricular ejection fraction, by estimation, is 60 to 65%. The left ventricle has normal function. The left ventricle has no regional wall motion abnormalities. There is moderate concentric  left ventricular hypertrophy. Left ventricular diastolic parameters were normal.  2. Right ventricular systolic function is normal. The right ventricular size is normal. There is normal pulmonary artery systolic pressure.  3. Left atrial size was moderately dilated.  4. Moderate vegetation on the mitral valve.  5. Vegetation noted on the anterior  mitral valve leaflet. Recommend TEE to better evaluate.. The mitral valve is normal in structure and function. Trivial mitral valve regurgitation. No evidence of mitral stenosis.  6. Abnromal thickening of the right and non-coronary cusps concerning for endocarditis. Recommend TEE to better evaluate.. The aortic valve is abnormal. Aortic valve regurgitation is mild to moderate. No aortic stenosis is present.  7. Aortic valve vegetation is visualized on the right and noncoronary.  8. The inferior vena cava is normal in size with greater than 50% respiratory variability, suggesting right atrial pressure of 3 mmHg. FINDINGS  Left Ventricle: Left ventricular ejection fraction, by estimation, is 60 to 65%. The left ventricle has normal function. The left ventricle has no regional wall motion abnormalities. The left ventricular internal cavity size was normal in size. There is  moderate concentric left ventricular hypertrophy. Left ventricular diastolic parameters were normal. Right Ventricle: The right ventricular size is normal. No increase in right ventricular wall thickness. Right ventricular systolic function is normal. There is normal pulmonary artery systolic pressure. The tricuspid regurgitant velocity is 1.47 m/s, and  with an assumed right atrial pressure of 3 mmHg, the estimated right ventricular systolic pressure is 81.2 mmHg. Left Atrium: Left atrial size was moderately dilated. Right Atrium: Right atrial size was normal in size. Pericardium: There is no evidence of pericardial effusion. Mitral Valve: Vegetation noted on the anterior mitral valve leaflet. Recommend  TEE to better evaluate. The mitral valve is normal in structure and function. Normal mobility of the mitral valve leaflets. A moderate vegetation is seen on the anterior mitral leaflet. The MV vegetation measures 18 mm x 14 mm. Trivial mitral valve regurgitation. No evidence of mitral valve stenosis. MV peak gradient, 5.4 mmHg. The mean mitral valve gradient is 3.0 mmHg. Tricuspid Valve: Cannot rule out tricuspid valve vegetation. The tricuspid valve is normal in structure. Tricuspid valve regurgitation is mild . No evidence of tricuspid stenosis. Aortic Valve: Abnromal thickening of the right and non-coronary cusps concerning for endocarditis. Recommend TEE to better evaluate. The aortic valve is abnormal. Aortic valve regurgitation is mild to moderate. Aortic regurgitation PHT measures 229 msec.  No aortic stenosis is present. Aortic valve mean gradient measures 6.0 mmHg. Aortic valve peak gradient measures 13.2 mmHg. Aortic valve area, by VTI measures 2.54 cm. A vegetation is seen on the right and noncoronary. Pulmonic Valve: The pulmonic valve was normal in structure. Pulmonic valve regurgitation is not visualized. No evidence of pulmonic stenosis. Aorta: The aortic root is normal in size and structure. Venous: The inferior vena cava is normal in size with greater than 50% respiratory variability, suggesting right atrial pressure of 3 mmHg. IAS/Shunts: No atrial level shunt detected by color flow Doppler.  LEFT VENTRICLE PLAX 2D LVIDd:         3.90 cm  Diastology LVIDs:         2.55 cm  LV e' lateral:   9.46 cm/s LV PW:         1.40 cm  LV E/e' lateral: 11.7 LV IVS:        1.50 cm  LV e' medial:    10.70 cm/s LVOT diam:     2.00 cm  LV E/e' medial:  10.4 LV SV:         64.72 ml LV SV Index:   33.04 LVOT Area:     3.14 cm  RIGHT VENTRICLE             IVC RV Basal diam:  3.20 cm  IVC diam: 1.50 cm RV S prime:     17.00 cm/s TAPSE (M-mode): 1.3 cm LEFT ATRIUM             Index       RIGHT ATRIUM          Index  LA diam:        2.70 cm 1.38 cm/m  RA Area:     9.75 cm LA Vol (A2C):   49.7 ml 25.38 ml/m RA Volume:   15.80 ml 8.07 ml/m LA Vol (A4C):   66.8 ml 34.11 ml/m LA Biplane Vol: 58.3 ml 29.77 ml/m  AORTIC VALVE AV Area (Vmax):    2.45 cm AV Area (Vmean):   2.62 cm AV Area (VTI):     2.54 cm AV Vmax:           182.00 cm/s AV Vmean:          120.000 cm/s AV VTI:            0.255 m AV Peak Grad:      13.2 mmHg AV Mean Grad:      6.0 mmHg LVOT Vmax:         142.00 cm/s LVOT Vmean:        100.000 cm/s LVOT VTI:          0.206 m LVOT/AV VTI ratio: 0.81 AI PHT:            229 msec  AORTA Ao Root diam: 2.50 cm Ao Asc diam:  2.80 cm MITRAL VALVE                TRICUSPID VALVE MV Area (PHT): 5.84 cm     TR Peak grad:   8.6 mmHg MV Peak grad:  5.4 mmHg     TR Vmax:        147.00 cm/s MV Mean grad:  3.0 mmHg MV Vmax:       1.16 m/s     SHUNTS MV Vmean:      80.4 cm/s    Systemic VTI:  0.21 m MV Decel Time: 130 msec     Systemic Diam: 2.00 cm MV E velocity: 111.00 cm/s MV A velocity: 113.00 cm/s MV E/A ratio:  0.98 Skeet Latch MD Electronically signed by Skeet Latch MD Signature Date/Time: 05/10/2019/8:33:46 AM    Final    ECHO TEE  Result Date: 05/12/2019    TRANSESOPHOGEAL ECHO REPORT   Patient Name:   BEATRICE SEHGAL Date of Exam: 05/12/2019 Medical Rec #:  [id number]    Height:       69.0 in Accession #:    [id number]   Weight:       180.0 lb Date of Birth:  12-Jul-1980    BSA:          1.976 m Patient Age:    31 years     BP:           113/80 mmHg Patient Gender: M            HR:           82 bpm. Exam Location:  Inpatient Procedure: Transesophageal Echo Indications:     endocarditis  History:         Patient has prior history of Echocardiogram examinations, most                  recent 05/09/2019. Signs/Symptoms:Bacteremia.  Sonographer:     Johny Chess Referring Phys:  Joséphine.[place]  DUNN Diagnosing Phys: Skeet Latch MD PROCEDURE: The transesophogeal probe was passed without difficulty through the  esophogus of the patient. Sedation performed by different physician. The patient was monitored while under deep sedation. Anesthestetic sedation was provided intravenously by Anesthesiology: 439m of Propofol, 464mof Lidocaine. The patient's vital signs; including heart rate, blood pressure, and oxygen saturation; remained stable throughout the procedure. The patient developed no complications during the procedure. IMPRESSIONS  1. Left ventricular ejection fraction, by estimation, is 55 to 60%. The left ventricle has normal function. The left ventricle has no regional wall motion abnormalities. There is mild concentric left ventricular hypertrophy. Left ventricular diastolic function could not be evaluated.  2. Right ventricular systolic function is normal. The right ventricular size is normal.  3. No left atrial/left atrial appendage thrombus was detected.  4. There is a large (1.04 cm x 1.54 cm) round, well-circumscribed echodensity on the anterior (A2) mitral valve leaflet. There is also a mobile, filamentous lesion on the posterior (P1) leaflet (1.46 cm x 0.33 cm). Both are new since the TEE in 2019. Given the well-circumscribed nature of the anterior leaflet abnormality, suspect this may reflection prior endocarditis. The filamentous posterior leaflet finding is more consistent with acute endocarditis. The mitral valve is normal in structure and function. Mild mitral valve regurgitation. No evidence of mitral stenosis.  5. No evidence of aortic valve endocarditis. The aortic valve is normal in structure and function. Aortic valve regurgitation is mild to moderate. No aortic stenosis is present.  6. The inferior vena cava is normal in size with greater than 50% respiratory variability, suggesting right atrial pressure of 3 mmHg. Conclusion(s)/Recommendation(s): Normal biventricular function without evidence of hemodynamically significant valvular heart disease. Findings are concerning for vegetation/infective  endocarditis as detailed above. FINDINGS  Left Ventricle: Left ventricular ejection fraction, by estimation, is 55 to 60%. The left ventricle has normal function. The left ventricle has no regional wall motion abnormalities. The left ventricular internal cavity size was normal in size. There is  mild concentric left ventricular hypertrophy. Right Ventricle: The right ventricular size is normal. No increase in right ventricular wall thickness. Right ventricular systolic function is normal. Left Atrium: Left atrial size was normal in size. No left atrial/left atrial appendage thrombus was detected. Right Atrium: Right atrial size was normal in size. Pericardium: There is no evidence of pericardial effusion. Mitral Valve: There is a large (1.04 cm x 1.54 cm) round, well-circumscribed echodensity on the anterior (A2) mitral valve leaflet. There is also a mobile, filamentous lesion on the posterior (P1) leaflet (1.46 cm x 0.33 cm). Both are new since the TEE in 2019. Given the well-circumscribed nature of the anterior leaflet abnormality, suspect this may reflection prior endocarditis. The filamentous posterior leaflet finding is more consistent with acute endocarditis. The mitral valve is normal in structure and function. Normal mobility of the mitral valve leaflets. Mild mitral valve regurgitation. No evidence of mitral valve stenosis. MV peak gradient, 2.9 mmHg. The mean mitral valve gradient is 1.0 mmHg. Tricuspid Valve: The tricuspid valve is normal in structure. Tricuspid valve regurgitation is not demonstrated. No evidence of tricuspid stenosis. Aortic Valve: No evidence of aortic valve endocarditis. The aortic valve is normal in structure and function. Aortic valve regurgitation is mild to moderate. Aortic regurgitation PHT measures 811 msec. No aortic stenosis is present. Pulmonic Valve: The pulmonic valve was normal in structure. Pulmonic valve regurgitation is not visualized. No evidence of pulmonic stenosis.  Aorta: The aortic root is normal in size  and structure. Venous: The inferior vena cava is normal in size with greater than 50% respiratory variability, suggesting right atrial pressure of 3 mmHg. IAS/Shunts: No atrial level shunt detected by color flow Doppler.  AORTIC VALVE AI PHT:      811 msec MITRAL VALVE MV Peak grad: 2.9 mmHg MV Mean grad: 1.0 mmHg MV Vmax:      0.84 m/s MV Vmean:     51.0 cm/s Skeet Latch MD Electronically signed by Skeet Latch MD Signature Date/Time: 05/12/2019/2:30:37 PM    Final    VAS [place] LOWER EXTREMITY VENOUS (DVT)  Result Date: 05/12/2019  Lower Venous DVTStudy Indications: Pain, and Swelling. Other Indications: IVDU. Comparison Study: No prior study Performing Technologist: Maudry Mayhew MHA, RDMS, RVT, RDCS  Examination Guidelines: A complete evaluation includes B-mode imaging, spectral Doppler, color Doppler, and power Doppler as needed of all accessible portions of each vessel. Bilateral testing is considered an integral part of a complete examination. Limited examinations for reoccurring indications may be performed as noted. The reflux portion of the exam is performed with the patient in reverse Trendelenburg.  +---------+---------------+---------+-----------+----------+--------------+ RIGHT    CompressibilityPhasicitySpontaneityPropertiesThrombus Aging +---------+---------------+---------+-----------+----------+--------------+ CFV      Full           Yes      Yes                                 +---------+---------------+---------+-----------+----------+--------------+ SFJ      Full                                                        +---------+---------------+---------+-----------+----------+--------------+ FV Prox  Full                                                        +---------+---------------+---------+-----------+----------+--------------+ FV Mid   Full                                                         +---------+---------------+---------+-----------+----------+--------------+ FV DistalFull                                                        +---------+---------------+---------+-----------+----------+--------------+ PFV      Full                                                        +---------+---------------+---------+-----------+----------+--------------+ POP      Full           Yes      Yes                                 +---------+---------------+---------+-----------+----------+--------------+  PTV      Full                                                        +---------+---------------+---------+-----------+----------+--------------+ PERO     Full                                                        +---------+---------------+---------+-----------+----------+--------------+ Gastroc  Full                    Yes                                 +---------+---------------+---------+-----------+----------+--------------+   +----+---------------+---------+-----------+----------+--------------+ LEFTCompressibilityPhasicitySpontaneityPropertiesThrombus Aging +----+---------------+---------+-----------+----------+--------------+ CFV Full           Yes      Yes                                 +----+---------------+---------+-----------+----------+--------------+     Summary: RIGHT: - There is no evidence of deep vein thrombosis in the lower extremity.  - No cystic structure found in the popliteal fossa. - Ultrasound characteristics of enlarged lymph nodes are noted in the groin.  LEFT: - No evidence of common femoral vein obstruction. - Ultrasound characteristics of enlarged lymph nodes noted in the groin.  *See table(s) above for measurements and observations. Electronically signed by Ruta Hinds MD on 05/12/2019 at 11:49:21 AM.    Final    [place] EKG SITE RITE  Result Date: 05/16/2019 If [facility] image not attached, placement could not be confirmed  due to current cardiac rhythm.  [place] EKG SITE RITE  Result Date: 05/12/2019 If [facility] image not attached, placement could not be confirmed due to current cardiac rhythm.  [place] EKG SITE RITE  Result Date: 05/10/2019 If [facility] image not attached, placement could not be confirmed due to current cardiac rhythm.   Microbiology: Recent Results (from the past 240 hour(s))  Culture, blood (routine x 2)     Status: None   Collection Time: 05/20/19  8:10 AM   Specimen: BLOOD RIGHT ARM  Result Value Ref Range Status   Specimen Description BLOOD RIGHT ARM  Final   Special Requests   Final    BOTTLES DRAWN AEROBIC ONLY Blood Culture results may not be optimal due to an inadequate volume of blood received in culture bottles   Culture   Final    NO GROWTH 5 DAYS Performed at [facility], [place] [address], [place],  [address]    Report Status 05/25/2019 FINAL  Final  Culture, blood (routine x 2)     Status: None   Collection Time: 05/20/19  8:10 AM   Specimen: BLOOD RIGHT ARM  Result Value Ref Range Status   Specimen Description BLOOD RIGHT ARM  Final   Special Requests   Final    BOTTLES DRAWN AEROBIC ONLY Blood Culture results may not be optimal due to an inadequate volume of blood received in culture bottles   Culture   Final  NO GROWTH 5 DAYS Performed at [facility], [place] [address] [place], [place] [address]    Report Status 05/25/2019 FINAL  Final  MRSA PCR Screening     Status: None   Collection Time: 05/22/19  1:56 PM   Specimen: Nasal Mucosa; Nasopharyngeal  Result Value Ref Range Status   MRSA by PCR NEGATIVE NEGATIVE Final    Comment:        The GeneXpert MRSA Assay (FDA approved for NASAL specimens only), is one component of a comprehensive MRSA colonization surveillance program. It is not intended to diagnose MRSA infection nor to guide or monitor treatment for MRSA infections. Performed at [facility], [place] [address] [place],  [place] [address]   Aerobic/Anaerobic Culture (surgical/deep wound)     Status: None   Collection Time: 05/22/19  4:00 PM   Specimen: Wound; Abscess  Result Value Ref Range Status   Specimen Description ABSCESS  Final   Special Requests RT ARM  Final   Gram Stain   Final    FEW WBC PRESENT, PREDOMINANTLY PMN NO ORGANISMS SEEN    Culture   Final    No growth aerobically or anaerobically. Performed at [facility], [place] [address], [place], [place] [address]    Report Status 05/27/2019 FINAL  Final  Culture, Urine     Status: None   Collection Time: 05/23/19  2:03 PM   Specimen: Urine, Clean Catch  Result Value Ref Range Status   Specimen Description URINE, CLEAN CATCH  Final   Special Requests NONE  Final   Culture   Final    NO GROWTH Performed at [facility], [place] [address], [place],  [address]    Report Status 05/24/2019 FINAL  Final     Labs: Basic Metabolic Panel: Recent Labs  Lab 05/24/19 0316 05/25/19 0337 05/26/19 0410 05/27/19 0500 05/28/19 0430  NA 137 139 137 135 138  K 4.6 4.0 4.1 4.0 3.8  CL 108 108 106 104 107  CO2 21* _0 GLUCOSE 338* 113* 279* 376* 236*  BUN _1 CREATININE 2.17* 1.73* 1.53* 1.49* 1.71*  CALCIUM 8.2* 7.9* 8.4* 8.4* 8.7*   Liver Function Tests: Recent Labs  Lab 05/23/19 0125 05/24/19 0316  AST 18 16  ALT 21 19  ALKPHOS 103 92  BILITOT 0.3 0.4  PROT 5.7* 5.4*  ALBUMIN 2.2* 2.1*   No results for input(s): LIPASE, AMYLASE in the last 168 hours. No results for input(s): AMMONIA in the last 168 hours. CBC: Recent Labs  Lab 05/23/19 0125 05/23/19 0125 05/24/19 0316 05/25/19 0337 05/26/19 1822 05/27/19 0600 05/28/19 0430  WBC 16.8*  --  13.0* 15.2*  --  13.0* 13.2*  NEUTROABS  --   --   --   --   --   --  8.3*  HGB 7.8*   < > 7.4* 7.0* 7.3* 7.3* 7.7*  HCT 25.4*   < > 24.0* 22.6* 22.8* 24.1* 25.5*  MCV 77.2*  --  76.9* 75.8*  --  76.5* 77.5*  PLT 496*  --  494* 510*  --  519* 571*   < >  = values in this interval not displayed.   Cardiac Enzymes: No results for input(s): CKTOTAL, CKMB, CKMBINDEX, TROPONINI in the last 168 hours. BNP: BNP (last 3 results) No results for input(s): BNP in the last 8760 hours.  ProBNP (last 3 results) No results for input(s): PROBNP  in the last 8760 hours.  CBG: Recent Labs  Lab 05/28/19 1101 05/28/19 1643 05/28/19 2131 05/29/19 0550 05/29/19 1109  GLUCAP 324* 168* 262* 115* 256*       Signed:  Nita Sells MD   [facility] 05/29/2019, 11:27 AM

## 2019-05-29 NOTE — Progress Notes (Signed)
[name] for Infectious Disease    Date of Admission:  05/19/2019   Total days of antibiotics 11           ID: Luke [name] is a 39 y.o. 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 Wants to leave" I have too much to do, I can't stay"  Medications:  . atorvastatin  20 mg Oral Daily  . carvedilol  12.5 mg Oral BID WC  . Chlorhexidine Gluconate Cloth  6 each Topical Daily  . DULoxetine  30 mg Oral Daily  . feeding supplement (GLUCERNA SHAKE)  237 mL Oral TID BM  . gabapentin  200 mg Oral TID  . insulin aspart  0-15 Units Subcutaneous TID WC  . insulin aspart  4 Units Subcutaneous TID WC  . insulin glargine  30 Units Subcutaneous BID  . levothyroxine  50 mcg Oral Daily  . lidocaine  1 patch Transdermal Q24H  . multivitamin with minerals  1 tablet Oral Daily  . pantoprazole  40 mg Oral Daily  . senna-docusate  1 tablet Oral BID  . sertraline  50 mg Oral Daily  . sodium chloride flush  10-40 mL Intracatheter Q12H  . traZODone  50 mg Oral QHS    Objective: Vital signs in last 24 hours: Temp:  [97.9 F (36.6 C)-98.5 F (36.9 C)] 97.9 F (36.6 C) (03/12 0446) Pulse Rate:  [87-95] 88 (03/12 0446) Resp:  [17-18] 18 (03/11 2337) BP: (132-153)/(66-91) 153/91 (03/12 0446) SpO2:  [98 %-100 %] 98 % (03/12 0446) Physical Exam  Constitutional: He is oriented to person, place, and time. He appears well-developed and well-nourished. No  distress.  HENT:  Mouth/Throat: Oropharynx is clear and moist. No oropharyngeal exudate.  Cardiovascular: Normal rate, regular rhythm and normal heart sounds. Exam reveals no gallop and no friction rub.  No murmur heard.  Pulmonary/Chest: Effort normal and breath sounds normal. No respiratory distress. He has no wheezes.  Abdominal: Soft. Bowel sounds are normal. He exhibits no distension. There is no tenderness.  Lymphadenopathy:  He has no cervical adenopathy.  Neurological: He is alert and oriented to person, place, and time.  Skin: right arm wrapped Psychiatric: He has a normal mood and affect. His behavior is normal.     Lab Results Recent Labs    05/27/19 0500 05/27/19 0600 05/28/19 0430 05/29/19 0813  WBC  --  13.0* 13.2*  --   HGB  --  7.3* 7.7*  --   HCT  --  24.1* 25.5*  --   NA   < >  --  138 138  K   < >  --  3.8 4.1  CL   < >  --  107 108  CO2   < >  --  22 20*  BUN   < >  --  18 17  CREATININE   < >  --  1.71*  1.61*   < > = values in this interval not displayed.   Liver Panel No results for input(s): PROT, ALBUMIN, AST, ALT, ALKPHOS, BILITOT, BILIDIR, IBILI in the last 72 hours. Sedimentation Rate No results for input(s): ESRSEDRATE in the last 72 hours. C-Reactive Protein No results for input(s): CRP in the last 72 hours.  Microbiology: reviewed Studies/Results: No results found.   Assessment/Plan: Partially treated streptococcal MV endocarditis = attempted to treat with 14 days of penicillin plus gentamicin, but patient wants to leave. Not willing to stay to complete rest of the course. To finish out course, we will give him one dose of oritavancin that has long half life and will treat right arm cellulitis/deep tissue abscess  Hx of illicit drug use = recommend to avoid since he is at high risk for recurrent infection.  [facility] Cell: [phone] Pager: [phone]  05/29/2019, 12:26 PM

## 2019-05-29 NOTE — Progress Notes (Signed)
Patient approched the nurses station saying the Garamycin infusion was making him hurt and he wanted it stopped right away. This RN stopped the infusion and informed the primary RN.

## 2019-05-29 NOTE — Plan of Care (Signed)

## 2019-05-29 NOTE — Progress Notes (Signed)
Pt IJ central dsg to neck removed. Blue tip noted intact with no discharge or drainage noted on the catheter. Sterile dsg with gauze pressure dsg applied per protocol. No active bleeding noted after pressure applied to site upon removal. Pt RN updated. Delia Heady RN

## 2019-05-29 NOTE — Progress Notes (Signed)
Pharmacy Antibiotic Note  Luke [name] is a 39 y.o. male admitted on 05/19/2019 with streptococcal MV.  Pharmacy has been consulted for gentamicin dosing. Scr 1.49>>1.71 Gent trough <0.5 mcg/ml  Plan: Continue gentamicin 210mg  IV q24 hours and continue to monitor SCr  Temp (24hrs), Avg:98.1 F (36.7 C), Min:97.9 F (36.6 C), Max:98.5 F (36.9 C)  Recent Labs  Lab 05/23/19 0125 05/23/19 0125 05/24/19 0316 05/25/19 0004 05/25/19 0337 05/26/19 0146 05/26/19 0410 05/27/19 0500 05/27/19 0600 05/28/19 0430 05/29/19 0335  WBC 16.8*  --  13.0*  --  15.2*  --   --   --  13.0* 13.2*  --   CREATININE 1.72*   < > 2.17*  --  1.73*  --  1.53* 1.49*  --  1.71*  --   GENTTROUGH  --   --   --    < >  --  0.5  --   --   --   --  <0.5*   < > = values in this interval not displayed.    Estimated Creatinine Clearance: 64.3 mL/min (A) (by C-G formula based on SCr of 1.71 mg/dL (H)).    No Known Allergies   Luke [name], PharmD, BCPS, FNKF Clinical Pharmacist [place] Please utilize Amion for appropriate phone number to reach the unit pharmacist ([place])   05/29/2019 8:53 AM

## 2019-06-01 ENCOUNTER — Telehealth: Payer: Self-pay

## 2019-06-01 NOTE — Telephone Encounter (Signed)
Transition Care Management Follow-up Telephone Call  Date of discharge and from where: 05/29/2019, [facility].    How have you been since you were released from the hospital? He stated that he is " actually doing pretty good."   Any questions or concerns? 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  he said that he has the instructions at home and he was not at home at the time of this call.   Medications obtained and verified?  he said that he has the new medications and lantus pens but not the humlog. He said that he needs to refill the medications that he was on prior to his hospitalization. Instructed him to review the medication list when he gets home and to call this clinic with any questions as there are new, changed and discontinued meds. He said that he is aware that he needs to stop the lisinopril. No questions at this time.   Any new allergies since your discharge?  none reported   Do you have support at home?  yes, he said that he has plenty of help at home  Other (ie: DME, Home Health, etc) no home health ordered.   Has glucometer. This morning his blood sugar- 151.  He said that he was not given any wound care instructions and did not have any supplies.  Instructed him to call Dr Sharol Given about the wound care orders and to schedule a follow up appointment  He has not contacted the suboxone clinic yet.  Explained to him that [facility] has a LCSW on staff and she is available to provide extra support and resources if needed.    Functional Questionnaire: (I = Independent and D = Dependent) ADL's: independent.      Follow up appointments reviewed:    PCP Hospital f/u appt confirmed?. scheduled appointment with Ms Raul Del, NP 06/09/2019 @ 1050.   [facility] f/u appt confirmed? .needs to schedule  follow up with Dr Sharol Given.  Are transportation arrangements needed?  no, he said he has transportation.   If their condition worsens, is the pt aware to call  their PCP or go to the ED?  yes  Was the patient provided with contact information for the PCP's office or ED?  He has the phone number for the clinic.   Was the pt encouraged to call back with questions or concerns?   yes

## 2019-06-05 ENCOUNTER — Other Ambulatory Visit: Payer: Self-pay

## 2019-06-05 ENCOUNTER — Encounter: Payer: Self-pay | Admitting: Physician Assistant

## 2019-06-05 ENCOUNTER — Ambulatory Visit (INDEPENDENT_AMBULATORY_CARE_PROVIDER_SITE_OTHER): Payer: Commercial Managed Care - PPO | Admitting: Physician Assistant

## 2019-06-05 VITALS — Ht 69.0 in | Wt 198.0 lb

## 2019-06-05 DIAGNOSIS — M86272 Subacute osteomyelitis, left ankle and foot: Secondary | ICD-10-CM

## 2019-06-05 MED FILL — HUMALOG 100 UNITS/ML KWIKPE: 100 | 25 days supply | Qty: 15 | Fill #0

## 2019-06-05 MED FILL — GABAPENTIN 400 MG CAPSULE: 400 | 30 days supply | Qty: 90 | Fill #1

## 2019-06-05 NOTE — Progress Notes (Signed)
Office Visit Note   [name]           Date of Birth: [birth year]           MRN: [id number] Visit Date: 06/05/2019              Requested by: Gildardo Pounds, NP [address] [place],  [place] [address] PCP: Gildardo Pounds, NP  Chief Complaint  Patient presents with  . Right Forearm - Routine Post Op    05/22/19 I&D right forearm.       HPI: This is a pleasant 39 year old gentleman who is here in follow-up.  He is 2 weeks status post irrigation and debridement of his right arm.  He comes in today without a dressing on his arm however he does say that his wife has been applying a dressing daily he is also wondering about some type of prosthetic to help.  He is status post great toe amputation and he is beginning to have curling of his second toe.  It is not painful but he is wondering if anything can be done about this  Assessment & Plan: Visit Diagnoses: No diagnosis found.  Plan:  I have given him a toe sleeve to use as needed.  He also could look into orthotics if he wanted to.  This would not really change the clawing of his toe. Follow-Up Instructions: No follow-ups on file.   Ortho Exam  Patient is alert, oriented, no adenopathy, well-dressed, normal affect, normal respiratory effort. Focused examination of his right arm demonstrates wound dehiscence.  This is superficial.  Does not probe deeply.  No foul odor minimal serous drainage.  No surrounding cellulitis swelling is controlled sutures were harvested mupirocin and a dry dressing were applied Focused examination of his left foot demonstrates no swelling no cellulitis he is status post left great toe amputation with a well-healed surgical incision.  He has some clawing of the second toe but there is only  minor irritation at the PIP joint no surrounding cellulitis or drainage or foul odor  Imaging: No results found. No images are attached to the encounter.  Labs: Lab Results  Component Value Date   HGBA1C 10.9 (H) 05/20/2019   HGBA1C 10.0 (H) 03/19/2019   HGBA1C 12.7 (A) 10/01/2018   ESRSEDRATE 62 (H) 05/19/2019   ESRSEDRATE 55 (H) 05/16/2019   ESRSEDRATE 55 (H) 03/19/2019   CRP 0.6 05/16/2019   CRP 3.8 (H) 03/19/2019   CRP 9.0 (H) 11/23/2017   LABURIC 3.9 05/28/2019   REPTSTATUS 05/24/2019 FINAL 05/23/2019   GRAMSTAIN  05/22/2019    FEW WBC PRESENT, PREDOMINANTLY PMN NO ORGANISMS SEEN    CULT  05/23/2019    NO GROWTH Performed at [facility], [place] [address], [place], [place] [address]    LABORGA STREPTOCOCCUS MITIS/ORALIS 05/08/2019     Lab Results  Component Value Date   ALBUMIN 2.1 (L) 05/24/2019   ALBUMIN 2.2 (L) 05/23/2019   ALBUMIN 2.6 (L) 05/22/2019   LABURIC 3.9 05/28/2019    Lab Results  Component Value Date   MG 1.8 05/16/2019   MG 1.7 05/15/2019   MG 1.8 05/14/2019   No results found for: VD25OH  No results found for: PREALBUMIN CBC EXTENDED Latest Ref Rng & Units 05/28/2019 05/27/2019 05/26/2019  WBC 4.0 - 10.5 K/uL 13.2(H) 13.0(H) -  RBC 4.22 - 5.81 MIL/uL 3.29(L) 3.15(L) -  HGB 13.0 - 17.0 g/dL 7.7(L) 7.3(L) 7.3(L)  HCT 39.0 - 52.0 %  25.5(L) 24.1(L) 22.8(L)  PLT 150 - 400 K/uL 571(H) 519(H) -  NEUTROABS 1.7 - 7.7 K/uL 8.3(H) - -  LYMPHSABS 0.7 - 4.0 K/uL 3.3 - -     Body mass index is 29.24 kg/m.  Orders:  No orders of the defined types were placed in this encounter.  No orders of the defined types were placed in this encounter.    Procedures: No procedures performed  Clinical Data: No additional findings.  ROS:  All other systems negative, except as noted in the HPI. Review of Systems  Objective: Vital Signs: Ht 5\' 9"  (1.753 m)   Wt 198 lb (89.8 kg)   BMI 29.24 kg/m   Specialty Comments:  No specialty comments available.  PMFS History: Patient Active Problem List   Diagnosis Date Noted  . Abscess of right forearm   . Endocarditis 05/20/2019  . Acute bacterial endocarditis   . Sepsis ([place]) 05/09/2019  .  Heroin abuse ([place]) 05/09/2019  . Right forearm cellulitis   . Hypothyroidism   . Subacute osteomyelitis, left ankle and foot ([place])   . Cutaneous abscess of left foot 09/04/2018  . Diabetic ulcer of left foot ([place]) 08/28/2018  . Acute hematogenous osteomyelitis, left ankle and foot ([place])   . Streptococcal bacteremia 11/25/2017  . Hepatitis C 11/25/2017  . Actinomyces infection   . GERD (gastroesophageal reflux disease) 07/21/2017  . Diabetic polyneuropathy associated with type 1 diabetes mellitus ([place])   . Prostatitis, acute   . MRSA infection   . Noncompliant bladder   . Urinary retention   . Prostate abscess   . ARF (acute renal failure) ([place]) 12/16/2016  . Anemia 12/16/2016  . Cocaine abuse ([place]) 08/16/2015  . Uncontrolled insulin dependent type 1 diabetes mellitus ([place]) 08/16/2015  . Cellulitis 12/03/2014  . AKI (acute kidney injury) ([place]) 12/03/2014  . Elevated liver enzymes 07/26/2014  . Dental caries 07/23/2014  . Tinea pedis 07/23/2014  . DKA (diabetic ketoacidoses) ([place]) 11/23/2013  . Abscess 10/14/2012  . CKD (chronic kidney disease) stage 3, GFR 30-59 ml/min 10/14/2012  . 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 Tobacco abuse 10/11/2012  . Essential hypertension 10/11/2012   Past Medical History:  Diagnosis Date  . Anemia   . Anxiety  Dx 2008  . Chronic kidney disease   . Diabetes mellitus without complication ([place]) Dx 8250  . DKA (diabetic ketoacidoses) ([place]) 07/21/2017  . GERD (gastroesophageal reflux disease) Dx 2008  . Headache(784.0)   . Hypertension   . Pneumonia   . Seizures ([place]) 2011    x 2 in lifetime. on Dilantin for a while.   . Substance abuse ([place]) 2013    heroin use, multiple relapses    Family History  Problem Relation Age of Onset  . Diabetes Father   . Heart disease Father   . Mental illness Sister   . Cancer Neg Hx     Past Surgical History:  Procedure Laterality Date  . AMPUTATION Left 09/05/2018   Procedure: LEFT GREAT TOE AMPUTATION;   Surgeon: [name], MD;  Location: [facility];  Service: Orthopedics;  Laterality: Left;  . I & D EXTREMITY Left 10/11/2012   Procedure: IRRIGATION AND DEBRIDEMENT ABSCESS FOREARM;  Surgeon: Linna Hoff, MD;  Location: [facility];  Service: Orthopedics;  Laterality: Left;  . I & D EXTREMITY Left 10/12/2012   Procedure: IRRIGATION AND DEBRIDEMENT FOREARM;  Surgeon: Linna Hoff, MD;  Location: [facility];  Service: Orthopedics;  Laterality: Left;  . I &  D EXTREMITY Left 10/14/2012   Procedure: incision and drainage left forearm;  Surgeon: Linna Hoff, MD;  Location: [facility];  Service: Orthopedics;  Laterality: Left;  . I & D EXTREMITY Left 10/16/2012   Procedure: IRRIGATION AND DEBRIDEMENT LEFT FOREARM;  Surgeon: Linna Hoff, MD;  Location: [facility];  Service: Orthopedics;  Laterality: Left;  . I & D EXTREMITY Left 10/20/2012   Procedure: INCISION AND DRAINAGE AND DEBRIDEMENT LEFT  FOREARM;  Surgeon: Linna Hoff, MD;  Location: [facility];  Service: Orthopedics;  Laterality: Left;  . I & D EXTREMITY Right 05/22/2019   Procedure: IRRIGATION AND DEBRIDEMENT RIGHT ARM;  Surgeon: [name], MD;  Location: [facility];  Service: Orthopedics;  Laterality: Right;  . INCISION AND DRAINAGE OF WOUND Bilateral 11/29/2017   Procedure: DEBRIDEMENT BILATERAL FEET, DEBRIDEMENT LEFT ANKLE, AND APPLY WOUND VAC;  Surgeon: [name], MD;  Location: [facility];  Service: Orthopedics;  Laterality: Bilateral;  . IRRIGATION AND DEBRIDEMENT ABSCESS     Hx: of left arm abscess related to drug use   . TEE WITHOUT CARDIOVERSION N/A 11/28/2017   Procedure: TRANSESOPHAGEAL ECHOCARDIOGRAM (TEE);  Surgeon: Sanda Klein, MD;  Location: [facility];  Service: Cardiovascular;  Laterality: N/A;  . TEE WITHOUT CARDIOVERSION N/A 05/12/2019   Procedure: TRANSESOPHAGEAL ECHOCARDIOGRAM (TEE);  Surgeon: Skeet Latch, MD;  Location: [facility];  Service: Cardiovascular;  Laterality: N/A;   Social History   Occupational History  .  Occupation: Event set up    Employer: [place]: [employer]   . Occupation: Stencil for cars   Tobacco Use  . Smoking status: Current Every Day Smoker    Packs/day: 1.00    Types: Cigarettes  . Smokeless tobacco: Never Used  Substance and Sexual Activity  . Alcohol use: No    Alcohol/week: 0.0 standard drinks    Comment: occasional  . Drug use: Not Currently    Types: Cocaine, IV, Heroin    Comment: IVDU since Dad died in 07/04/2006 with 15 month period of sobriety 2018-2019, relapse Aug 2019.  Marland Kitchen Sexual activity: Yes    Partners: Female    Birth control/protection: None

## 2019-06-09 ENCOUNTER — Encounter: Payer: Self-pay | Admitting: Nurse Practitioner

## 2019-06-09 ENCOUNTER — Ambulatory Visit: Payer: Commercial Managed Care - PPO | Attending: Nurse Practitioner | Admitting: Nurse Practitioner

## 2019-06-09 ENCOUNTER — Other Ambulatory Visit: Payer: Self-pay

## 2019-06-09 DIAGNOSIS — Z09 Encounter for follow-up examination after completed treatment for conditions other than malignant neoplasm: Secondary | ICD-10-CM | POA: Diagnosis not present

## 2019-06-09 DIAGNOSIS — F331 Major depressive disorder, recurrent, moderate: Secondary | ICD-10-CM

## 2019-06-09 DIAGNOSIS — E1142 Type 2 diabetes mellitus with diabetic polyneuropathy: Secondary | ICD-10-CM | POA: Diagnosis not present

## 2019-06-09 DIAGNOSIS — E1165 Type 2 diabetes mellitus with hyperglycemia: Secondary | ICD-10-CM | POA: Diagnosis not present

## 2019-06-09 DIAGNOSIS — M25512 Pain in left shoulder: Secondary | ICD-10-CM

## 2019-06-09 DIAGNOSIS — Z794 Long term (current) use of insulin: Secondary | ICD-10-CM

## 2019-06-09 DIAGNOSIS — F17210 Nicotine dependence, cigarettes, uncomplicated: Secondary | ICD-10-CM

## 2019-06-09 DIAGNOSIS — IMO0002 Reserved for concepts with insufficient information to code with codable children: Secondary | ICD-10-CM

## 2019-06-09 NOTE — Progress Notes (Signed)
Virtual Visit via Telephone Note Due to national recommendations of social distancing due to [place] 19, telehealth visit is felt to be most appropriate for this patient at this time.  I discussed the limitations, risks, security and privacy concerns of performing an evaluation and management service by telephone and the availability of in person appointments. I also discussed with the patient that there may be a patient responsible charge related to this service. The patient expressed understanding and agreed to proceed.    I connected with Luke [name] on 06/09/19  at  10:50 AM EDT  EDT by telephone and verified that I am speaking with the correct person using two identifiers.   Consent I discussed the limitations, risks, security and privacy concerns of performing an evaluation and management service by telephone and the availability of in person appointments. I also discussed with the patient that there may be a patient responsible charge related to this service. The patient expressed understanding and agreed to proceed.   Location of Patient: Private Residence   Location of Provider: [place] and [place] participating in Telemedicine visit: Geryl Rankins FNP-BC [place]    History of Present Illness: Telemedicine visit for: Hospital Follow Up   Admitted to the hospital on 05-19-2019 and discharged 05-29-2019 2/2 bacterial endocarditis due to abscess or right forearm from IVDU (heroin). Required I&D debridement and washout with wound vac as well as IV abx. He was discharged home with orders for W-D dressing changes.  PER HOSPITAL NOTE: Patient declined to stay in the hospital beyond 3/12 although completion of antibiotics [place] would have been 3/15-ID saw the patient and recommended off label oritavancin with the caveat that this is unproven as a therapy.  He was also discharged home on cymbalta, trazodone 50 mg and sertraline 50  mg daily for depression and insomnia. He will need repeat lab work. Unfortunately we have not been able to obtain blood for labwork in our lab on numerous visits due to poor access sites from his arms. Will need to have him go to [facility] for his blood work.   Today he has complaints of left shoulder pain. Constant soreness near the left shoulder blade. Encouraged to use heat and ice. This is also the same side he had his central line placed. Endorses completion of antibiotics.   He has not been set up with the Suboxone clinic yet.   Not monitoring his blood pressure at home. I have instructed him to have his wife (she is a Therapist, sports) check his blood pressure and he is to send me his readings through my chart at the end of this week.   DM TYPE 2 Current medications; lantus 30 units BID, humalog SSI 4-20 units. Hyperglycemic symptoms include peripheral neuropathy for which he has been prescribed gabapentin 400 mg TID.  Lab Results  Component Value Date   HGBA1C 10.9 (H) 05/20/2019     Past Medical History:  Diagnosis Date  . Anemia   . Anxiety  Dx 2008  . Chronic kidney disease   . Diabetes mellitus without complication ([place]) Dx 7672  . DKA (diabetic ketoacidoses) ([place]) 07/21/2017  . GERD (gastroesophageal reflux disease) Dx 2008  . Headache(784.0)   . Hypertension   . Pneumonia   . Seizures ([place]) 2011    x 2 in lifetime. on Dilantin for a while.   . Substance abuse ([place]) 2013    heroin use, multiple relapses  Past Surgical History:  Procedure Laterality Date  . AMPUTATION Left 09/05/2018   Procedure: LEFT GREAT TOE AMPUTATION;  Surgeon: [name], MD;  Location: [facility];  Service: Orthopedics;  Laterality: Left;  . I & D EXTREMITY Left 10/11/2012   Procedure: IRRIGATION AND DEBRIDEMENT ABSCESS FOREARM;  Surgeon: Linna Hoff, MD;  Location: [facility];  Service: Orthopedics;  Laterality: Left;  . I & D EXTREMITY Left 10/12/2012   Procedure: IRRIGATION AND DEBRIDEMENT FOREARM;  Surgeon:  Linna Hoff, MD;  Location: [facility];  Service: Orthopedics;  Laterality: Left;  . I & D EXTREMITY Left 10/14/2012   Procedure: incision and drainage left forearm;  Surgeon: Linna Hoff, MD;  Location: [facility];  Service: Orthopedics;  Laterality: Left;  . I & D EXTREMITY Left 10/16/2012   Procedure: IRRIGATION AND DEBRIDEMENT LEFT FOREARM;  Surgeon: Linna Hoff, MD;  Location: [facility];  Service: Orthopedics;  Laterality: Left;  . I & D EXTREMITY Left 10/20/2012   Procedure: INCISION AND DRAINAGE AND DEBRIDEMENT LEFT  FOREARM;  Surgeon: Linna Hoff, MD;  Location: [facility];  Service: Orthopedics;  Laterality: Left;  . I & D EXTREMITY Right 05/22/2019   Procedure: IRRIGATION AND DEBRIDEMENT RIGHT ARM;  Surgeon: [name], MD;  Location: [facility];  Service: Orthopedics;  Laterality: Right;  . INCISION AND DRAINAGE OF WOUND Bilateral 11/29/2017   Procedure: DEBRIDEMENT BILATERAL FEET, DEBRIDEMENT LEFT ANKLE, AND APPLY WOUND VAC;  Surgeon: [name], MD;  Location: [facility];  Service: Orthopedics;  Laterality: Bilateral;  . IRRIGATION AND DEBRIDEMENT ABSCESS     Hx: of left arm abscess related to drug use   . TEE WITHOUT CARDIOVERSION N/A 11/28/2017   Procedure: TRANSESOPHAGEAL ECHOCARDIOGRAM (TEE);  Surgeon: Sanda Klein, MD;  Location: [facility];  Service: Cardiovascular;  Laterality: N/A;  . TEE WITHOUT CARDIOVERSION N/A 05/12/2019   Procedure: TRANSESOPHAGEAL ECHOCARDIOGRAM (TEE);  Surgeon: Skeet Latch, MD;  Location: [facility];  Service: Cardiovascular;  Laterality: N/A;    Family History  Problem Relation Age of Onset  . Diabetes Father   . Heart disease Father   . Mental illness Sister   . Cancer Neg Hx     Social History   Socioeconomic History  . Marital status: Married    Spouse name: Not on file  . Number of children: 5  . Years of education: some colle  . Highest education level: Not on file  Occupational History  . Occupation: Event set up    Employer:  [place]: [employer]   . Occupation: Stencil for cars   Tobacco Use  . Smoking status: Current Every Day Smoker    Packs/day: 1.00    Types: Cigarettes  . Smokeless tobacco: Never Used  Substance and Sexual Activity  . Alcohol use: No    Alcohol/week: 0.0 standard drinks    Comment: occasional  . Drug use: Not Currently    Types: Cocaine, IV, Heroin    Comment: IVDU since Dad died in 06-08-2006 with 15 month period of sobriety 2018-2019, relapse Aug 2019.  Marland Kitchen Sexual activity: Yes    Partners: Female    Birth control/protection: None  Other Topics Concern  . Not on file  Social History Narrative   Lives with wife and two children age 19 and 101.          Social Determinants of Health   Financial Resource Strain:   . Difficulty of Paying Living Expenses:  Food Insecurity:   . Worried About Charity fundraiser in the Last Year:   . Arboriculturist in the Last Year:   Transportation Needs:   . Film/video editor (Medical):   Marland Kitchen Lack of Transportation (Non-Medical):   Physical Activity:   . Days of Exercise per Week:   . Minutes of Exercise per Session:   Stress:   . Feeling of Stress :   Social Connections:   . Frequency of Communication with Friends and Family:   . Frequency of Social Gatherings with Friends and Family:   . Attends Religious Services:   . Active Member of Clubs or Organizations:   . Attends Archivist Meetings:   Marland Kitchen Marital Status:      Observations/Objective: Awake, alert and oriented x 3   Review of Systems  Constitutional: Negative for fever, malaise/fatigue and weight loss.  HENT: Negative.  Negative for nosebleeds.   Eyes: Negative.  Negative for blurred vision, double vision and photophobia.  Respiratory: Negative.  Negative for cough and shortness of breath.   Cardiovascular: Negative.  Negative for chest pain, palpitations and leg swelling.  Gastrointestinal: Negative.  Negative for heartburn, nausea and vomiting.   Musculoskeletal: Negative.  Negative for myalgias.  Neurological: Negative.  Negative for dizziness, focal weakness, seizures and headaches.  Psychiatric/Behavioral: Positive for depression. Negative for suicidal ideas. The patient is nervous/anxious.     Assessment and Plan: May was seen today for hospitalization follow-up.  Diagnoses and all orders for this visit:  Hospital discharge follow-up  Insulin dependent type 2 diabetes mellitus, uncontrolled ([place]) -     DULoxetine (CYMBALTA) 30 MG capsule; Take 1 capsule (30 mg total) by mouth daily. -     Ambulatory referral to Ophthalmology  Moderate episode of recurrent major depressive disorder ([name]) -     DULoxetine (CYMBALTA) 30 MG capsule; Take 1 capsule (30 mg total) by mouth daily. -     sertraline (ZOLOFT) 50 MG tablet; Take 1 tablet (50 mg total) by mouth daily. -     traZODone (DESYREL) 50 MG tablet; Take 1 tablet (50 mg total) by mouth at bedtime.     Follow Up Instructions Return in about 3 months (around 09/09/2019).     I discussed the assessment and treatment plan with the patient. The patient was provided an opportunity to ask questions and all were answered. The patient agreed with the plan and demonstrated an understanding of the instructions.   The patient was advised to call back or seek an in-person evaluation if the symptoms worsen or if the condition fails to improve as anticipated.  I provided 17 minutes of non-face-to-face time during this encounter including median intraservice time, reviewing previous notes, labs, imaging, medications and explaining diagnosis and management.  Gildardo Pounds, FNP-BC

## 2019-06-10 ENCOUNTER — Encounter: Payer: Self-pay | Admitting: Nurse Practitioner

## 2019-06-10 MED ORDER — DULOXETINE HCL 30 MG PO CPEP: 30.0000 mg | ORAL_CAPSULE | Freq: Every day | ORAL | 0 refills | Status: DC

## 2019-06-10 MED ORDER — SERTRALINE HCL 50 MG PO TABS: 50.0000 mg | ORAL_TABLET | Freq: Every day | ORAL | 0 refills | Status: DC

## 2019-06-10 MED ORDER — TRAZODONE HCL 50 MG PO TABS: 50.0000 mg | ORAL_TABLET | Freq: Every day | ORAL | 2 refills | Status: DC

## 2019-06-11 MED FILL — DULoxetine HCL 30 MG CPEP: 30 | 90 days supply | Qty: 90 | Fill #0

## 2019-06-11 MED FILL — SERTRALINE HCL 50 MG TABLET: 50 | 30 days supply | Qty: 30 | Fill #0

## 2019-06-18 ENCOUNTER — Telehealth: Payer: Self-pay

## 2019-06-18 NOTE — Telephone Encounter (Signed)
-----   Message from Gildardo Pounds, NP sent at 06/10/2019  9:58 PM EDT ----- Pls have Mr. Bache go to [facility] and have his labs drawn. Thank you. He declines blood draws here at [facility] due to poor access

## 2019-06-18 NOTE — Telephone Encounter (Signed)
No answer and LVM.

## 2019-06-23 ENCOUNTER — Ambulatory Visit: Payer: Commercial Managed Care - PPO | Attending: Nurse Practitioner

## 2019-06-23 ENCOUNTER — Other Ambulatory Visit: Payer: Self-pay

## 2019-06-23 DIAGNOSIS — R2242 Localized swelling, mass and lump, left lower limb: Secondary | ICD-10-CM

## 2019-06-23 DIAGNOSIS — N182 Chronic kidney disease, stage 2 (mild): Secondary | ICD-10-CM

## 2019-06-23 DIAGNOSIS — E039 Hypothyroidism, unspecified: Secondary | ICD-10-CM

## 2019-06-24 ENCOUNTER — Ambulatory Visit (INDEPENDENT_AMBULATORY_CARE_PROVIDER_SITE_OTHER): Payer: Commercial Managed Care - PPO | Admitting: Family

## 2019-06-24 ENCOUNTER — Other Ambulatory Visit: Payer: Self-pay

## 2019-06-24 ENCOUNTER — Ambulatory Visit: Payer: Self-pay

## 2019-06-24 ENCOUNTER — Encounter: Payer: Self-pay | Admitting: Family

## 2019-06-24 ENCOUNTER — Other Ambulatory Visit: Payer: Self-pay | Admitting: Nurse Practitioner

## 2019-06-24 VITALS — Ht 69.0 in | Wt 198.0 lb

## 2019-06-24 DIAGNOSIS — L97521 Non-pressure chronic ulcer of other part of left foot limited to breakdown of skin: Secondary | ICD-10-CM

## 2019-06-24 DIAGNOSIS — E039 Hypothyroidism, unspecified: Secondary | ICD-10-CM

## 2019-06-24 LAB — CMP14+EGFR
ALT: 11 IU/L (ref 0–44)
AST: 21 IU/L (ref 0–40)
Albumin/Globulin Ratio: 1.2 (ref 1.2–2.2)
Albumin: 3.7 g/dL — ABNORMAL LOW (ref 4.0–5.0)
Alkaline Phosphatase: 174 IU/L — ABNORMAL HIGH (ref 39–117)
BUN/Creatinine Ratio: 12 (ref 9–20)
BUN: 20 mg/dL (ref 6–20)
Bilirubin Total: <0.2 mg/dL (ref 0.0–1.2)
CO2: 17 mmol/L — ABNORMAL LOW (ref 20–29)
Calcium: 9.3 mg/dL (ref 8.7–10.2)
Chloride: 99 mmol/L (ref 96–106)
Creatinine, Ser: 1.67 mg/dL — ABNORMAL HIGH (ref 0.76–1.27)
GFR calc Af Amer: 59 mL/min/{1.73_m2} — ABNORMAL LOW (ref >59)
GFR calc non Af Amer: 51 mL/min/{1.73_m2} — ABNORMAL LOW (ref >59)
Globulin, Total: 3.1 g/dL (ref 1.5–4.5)
Glucose: 383 mg/dL — ABNORMAL HIGH (ref 65–99)
Potassium: 5.4 mmol/L — ABNORMAL HIGH (ref 3.5–5.2)
Sodium: 136 mmol/L (ref 134–144)
Total Protein: 6.8 g/dL (ref 6.0–8.5)

## 2019-06-24 LAB — TSH: TSH: 1.19 u[IU]/mL (ref 0.450–4.500)

## 2019-06-24 LAB — URIC ACID: Uric Acid: 5.2 mg/dL (ref 3.8–8.4)

## 2019-06-24 MED ORDER — DOXYCYCLINE HYCLATE 100 MG PO TABS: 100.0000 mg | ORAL_TABLET | Freq: Two times a day (BID) | ORAL | 0 refills | Status: DC

## 2019-06-24 MED ORDER — LEVOTHYROXINE SODIUM 50 MCG PO TABS: 50.0000 ug | ORAL_TABLET | Freq: Every day | ORAL | 0 refills | Status: DC

## 2019-06-24 MED FILL — LEVOTHYROXINE SODIUM 50 MCG: 50 | 90 days supply | Qty: 90 | Fill #0

## 2019-06-24 MED FILL — DOXYCYCLINE HYCLATE 100 MG: 100 | 14 days supply | Qty: 28 | Fill #0

## 2019-06-24 NOTE — Progress Notes (Signed)
Office Visit Note   [name]           Date of Birth: 1980/12/26           MRN: [id number] Visit Date: 06/24/2019              Requested by: Gildardo Pounds, NP [address] [place],  [place] [address] PCP: Gildardo Pounds, NP  Chief Complaint  Patient presents with  . Right Forearm - Routine Post Op    05/22/19 I&D forearm   . Left Foot - Pain      HPI: Patient is a 39 year old gentleman who is nearly 9 months status post left great toe amputation.  He was seen in the office a few weeks ago complaining of some painful clawing of his second toe he has been wearing a silicone sleeve over the second toe since visit.  He reports that he noticed about 4 days ago painful ulceration in the webspace as well as to the lateral aspect of the second toe this has not been having foul drainage complaining of exquisite tenderness.  Some very mild irritation over the dorsum of the PIP joint.  Denies fevers or chills  Assessment & Plan: Visit Diagnoses:  1. Ulcer of toe of left foot, limited to breakdown of skin ([place])     Plan: Given a tube of mupirocin today in the office we will also place him on doxycycline.  He will offload the toe stop wearing the sleeve.  Discussed return precautions he will follow-up in 2 weeks.  Follow-Up Instructions: No follow-ups on file.   Ortho Exam  Patient is alert, oriented, no adenopathy, well-dressed, normal affect, normal respiratory effort. On examination of the left foot he does not have any swelling of the foot or ankle he does have a ulcer to the lateral aspect of his second toe this is wrapping around to the plantar aspect of his toe this is 10 mm x 5 mm 100% filled in with fibrinous tissue there is no active drainage this does not probe to bone or tendon.  He does have thick swelling of the second toe.  Imaging: No results found. No images are attached to the encounter.  Labs: Lab Results  Component Value Date   HGBA1C 10.9 (H)  05/20/2019   HGBA1C 10.0 (H) 03/19/2019   HGBA1C 12.7 (A) 10/01/2018   ESRSEDRATE 62 (H) 05/19/2019   ESRSEDRATE 55 (H) 05/16/2019   ESRSEDRATE 55 (H) 03/19/2019   CRP 0.6 05/16/2019   CRP 3.8 (H) 03/19/2019   CRP 9.0 (H) 11/23/2017   LABURIC 5.2 06/23/2019   LABURIC 3.9 05/28/2019   REPTSTATUS 05/24/2019 FINAL 05/23/2019   GRAMSTAIN  05/22/2019    FEW WBC PRESENT, PREDOMINANTLY PMN NO ORGANISMS SEEN    CULT  05/23/2019    NO GROWTH Performed at [facility], [place] [address], [place], [place] [address]    LABORGA STREPTOCOCCUS MITIS/ORALIS 05/08/2019     Lab Results  Component Value Date   ALBUMIN 3.7 (L) 06/23/2019   ALBUMIN 2.1 (L) 05/24/2019   ALBUMIN 2.2 (L) 05/23/2019   LABURIC 5.2 06/23/2019   LABURIC 3.9 05/28/2019    Lab Results  Component Value Date   MG 1.8 05/16/2019   MG 1.7 05/15/2019   MG 1.8 05/14/2019   No results found for: VD25OH  No results found for: PREALBUMIN CBC EXTENDED Latest Ref Rng & Units 05/28/2019 05/27/2019 05/26/2019  WBC 4.0 - 10.5 K/uL 13.2(H) 13.0(H) -  RBC 4.22 - 5.81 MIL/uL 3.29(L) 3.15(L) -  HGB 13.0 - 17.0 g/dL 7.7(L) 7.3(L) 7.3(L)  HCT 39.0 - 52.0 % 25.5(L) 24.1(L) 22.8(L)  PLT 150 - 400 K/uL 571(H) 519(H) -  NEUTROABS 1.7 - 7.7 K/uL 8.3(H) - -  LYMPHSABS 0.7 - 4.0 K/uL 3.3 - -     Body mass index is 29.24 kg/m.  Orders:  Orders Placed This Encounter  Procedures  . XR Toe 2nd Left   No orders of the defined types were placed in this encounter.    Procedures: No procedures performed  Clinical Data: No additional findings.  ROS:  All other systems negative, except as noted in the HPI. Review of Systems  Constitutional: Negative for chills and fever.  Cardiovascular: Negative for leg swelling.  Skin: Positive for wound. Negative for color change.    Objective: Vital Signs: Ht 5\' 9"  (1.753 m)   Wt 198 lb (89.8 kg)   BMI 29.24 kg/m   Specialty Comments:  No specialty comments available.  PMFS  History: Patient Active Problem List   Diagnosis Date Noted  . Abscess of right forearm   . Endocarditis 05/20/2019  . Acute bacterial endocarditis   . Sepsis ([place]) 05/09/2019  . Heroin abuse ([place]) 05/09/2019  . Right forearm cellulitis   . Hypothyroidism   . Subacute osteomyelitis, left ankle and foot ([place])   . Cutaneous abscess of left foot 09/04/2018  . Diabetic ulcer of left foot ([place]) 08/28/2018  . Acute hematogenous osteomyelitis, left ankle and foot ([place])   . Streptococcal bacteremia 11/25/2017  . Hepatitis C 11/25/2017  . Actinomyces infection   . GERD (gastroesophageal reflux disease) 07/21/2017  . Diabetic polyneuropathy associated with type 1 diabetes mellitus ([place])   . Prostatitis, acute   . MRSA infection   . Noncompliant bladder   . Urinary retention   . Prostate abscess   . ARF (acute renal failure) ([place]) 12/16/2016  . Anemia 12/16/2016  . Cocaine abuse ([place]) 08/16/2015  . Uncontrolled insulin dependent type 1 diabetes mellitus ([place]) 08/16/2015  . Cellulitis 12/03/2014  . AKI (acute kidney injury) ([place]) 12/03/2014  . Elevated liver enzymes 07/26/2014  . Dental caries 07/23/2014  . Tinea pedis 07/23/2014  . DKA (diabetic ketoacidoses) ([place]) 11/23/2013  . Abscess 10/14/2012  . CKD (chronic kidney disease) stage 3, GFR 30-59 ml/min 10/14/2012  . 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 Tobacco abuse 10/11/2012  . Essential hypertension 10/11/2012   Past Medical History:  Diagnosis Date  . Anemia   . Anxiety  Dx 2008  . Chronic kidney disease   . Diabetes mellitus without complication ([place]) Dx 9937  . DKA (diabetic ketoacidoses) ([place]) 07/21/2017  . GERD (gastroesophageal reflux disease) Dx 2008  . Headache(784.0)   . Hypertension   . Pneumonia   . Seizures ([place]) 2011    x 2 in lifetime. on Dilantin for a while.   . Substance abuse ([place]) 2013    heroin use, multiple relapses    Family History  Problem Relation Age of Onset  . Diabetes Father   . Heart  disease Father   . Mental illness Sister   . Cancer Neg Hx     Past Surgical History:  Procedure Laterality Date  . AMPUTATION Left 09/05/2018   Procedure: LEFT GREAT TOE AMPUTATION;  Surgeon: [name], MD;  Location: [facility];  Service: Orthopedics;  Laterality: Left;  . I & D EXTREMITY Left 10/11/2012   Procedure: IRRIGATION AND DEBRIDEMENT ABSCESS FOREARM;  Surgeon: Linna Hoff, MD;  Location: [facility];  Service: Orthopedics;  Laterality: Left;  . I & D EXTREMITY Left 10/12/2012   Procedure: IRRIGATION AND DEBRIDEMENT FOREARM;  Surgeon: Linna Hoff, MD;  Location: [facility];  Service: Orthopedics;  Laterality: Left;  . I & D EXTREMITY Left 10/14/2012   Procedure: incision and drainage left forearm;  Surgeon: Linna Hoff, MD;  Location: [facility];  Service: Orthopedics;  Laterality: Left;  . I & D EXTREMITY Left 10/16/2012   Procedure: IRRIGATION AND DEBRIDEMENT LEFT FOREARM;  Surgeon: Linna Hoff, MD;  Location: [facility];  Service: Orthopedics;  Laterality: Left;  . I & D EXTREMITY Left 10/20/2012   Procedure: INCISION AND DRAINAGE AND DEBRIDEMENT LEFT  FOREARM;  Surgeon: Linna Hoff, MD;  Location: [facility];  Service: Orthopedics;  Laterality: Left;  . I & D EXTREMITY Right 05/22/2019   Procedure: IRRIGATION AND DEBRIDEMENT RIGHT ARM;  Surgeon: [name], MD;  Location: [facility];  Service: Orthopedics;  Laterality: Right;  . INCISION AND DRAINAGE OF WOUND Bilateral 11/29/2017   Procedure: DEBRIDEMENT BILATERAL FEET, DEBRIDEMENT LEFT ANKLE, AND APPLY WOUND VAC;  Surgeon: [name], MD;  Location: [facility];  Service: Orthopedics;  Laterality: Bilateral;  . IRRIGATION AND DEBRIDEMENT ABSCESS     Hx: of left arm abscess related to drug use   . TEE WITHOUT CARDIOVERSION N/A 11/28/2017   Procedure: TRANSESOPHAGEAL ECHOCARDIOGRAM (TEE);  Surgeon: Sanda Klein, MD;  Location: [facility];  Service: Cardiovascular;  Laterality: N/A;  . TEE WITHOUT CARDIOVERSION N/A 05/12/2019   Procedure:  TRANSESOPHAGEAL ECHOCARDIOGRAM (TEE);  Surgeon: Skeet Latch, MD;  Location: [facility];  Service: Cardiovascular;  Laterality: N/A;   Social History   Occupational History  . Occupation: Event set up    Employer: [place]   . Occupation: Stencil for cars   Tobacco Use  . Smoking status: Current Every Day Smoker    Packs/day: 1.00    Types: Cigarettes  . Smokeless tobacco: Never Used  Substance and Sexual Activity  . Alcohol use: No    Alcohol/week: 0.0 standard drinks    Comment: occasional  . Drug use: Not Currently    Types: Cocaine, IV, Heroin    Comment: IVDU since Dad died in Jun 23, 2006 with 15 month period of sobriety 2018-2019, relapse Aug 2019.  Marland Kitchen Sexual activity: Yes    Partners: Female    Birth control/protection: None

## 2019-06-25 ENCOUNTER — Ambulatory Visit: Payer: Commercial Managed Care - PPO | Attending: Nurse Practitioner | Admitting: Physician Assistant

## 2019-06-25 ENCOUNTER — Other Ambulatory Visit: Payer: Self-pay | Admitting: Physician Assistant

## 2019-06-25 DIAGNOSIS — E876 Hypokalemia: Secondary | ICD-10-CM | POA: Diagnosis not present

## 2019-06-25 DIAGNOSIS — IMO0002 Reserved for concepts with insufficient information to code with codable children: Secondary | ICD-10-CM

## 2019-06-25 DIAGNOSIS — R251 Tremor, unspecified: Secondary | ICD-10-CM

## 2019-06-25 DIAGNOSIS — E1065 Type 1 diabetes mellitus with hyperglycemia: Secondary | ICD-10-CM | POA: Diagnosis not present

## 2019-06-25 MED ORDER — LANTUS SOLOSTAR 100 UNIT/ML ~~LOC~~ SOPN: PEN_INJECTOR | SUBCUTANEOUS | 6 refills | Status: DC

## 2019-06-25 MED FILL — HUMALOG 100 UNITS/ML KWIKPE: 100 | 25 days supply | Qty: 15 | Fill #1

## 2019-06-25 MED FILL — LANTUS SOLOSTAR 100 UNITS/M: 100 | 25 days supply | Qty: 15 | Fill #0

## 2019-06-25 NOTE — Progress Notes (Signed)
Virtual Visit via Telephone Note  I connected with Luke [name] on 06/25/19 at  2:30 PM EDT by telephone and verified that I am speaking with the correct person using two identifiers.   I discussed the limitations, risks, security and privacy concerns of performing an evaluation and management service by telephone and the availability of in person appointments. I also discussed with the patient that there may be a patient responsible charge related to this service. The patient expressed understanding and agreed to proceed.  PATIENT visit by telephone virtually in the context of Covid-19 pandemic. Patient location:  At our office picking up meds My Location:  [facility] office Persons on the call:  Me and the patient   History of Present Illness: C/o shaking/tremor in his L hand-occurs mostly when he is trying to hold something.  No numbness or weakness.  He denies paralysis. He denies any pain.  No injury.  Long h/o IVDU and recent bacteremia with admission.  No fever.  Feels ok otherwise.    Blood sugars in the 300s.  He is currently talking Lantus 40 units once daily but it is prescribed as 30 units bid.  He is out of humalog.     Observations/Objective:  NAD.  A&Ox3   Assessment and Plan: 1. Uncontrolled insulin dependent type 1 diabetes mellitus ([name]) Take insulin as prescribed-30 units bid.  Drink 80-100 ounces water daily.  Check blood sugars tid and record and bring to next visit.   - insulin glargine (LANTUS SOLOSTAR) 100 UNIT/ML Solostar Pen; INJECT 30 UNITS INTO THE SKIN 2 (TWO) TIMES DAILY.  Dispense: 15 mL; Refill: 6 Continue other meds/humalog as prescribed.   Avoid illicit substances and alcohol  2. Hypokalemia Check at next visit-this is likely subsequent to electrolyte imbalance with uncontrolled blood sugar  3. Tremor of left hand Avoid illicit substances and alcohol Watchful waiting.  No red flags/symptoms stroke.  If continues once labs improve, consider neurology  referral   Follow Up Instructions: See Luke/DM management in 3 weeks;  PCP as scheduled   I discussed the assessment and treatment plan with the patient. The patient was provided an opportunity to ask questions and all were answered. The patient agreed with the plan and demonstrated an understanding of the instructions.   The patient was advised to call back or seek an in-person evaluation if the symptoms worsen or if the condition fails to improve as anticipated.  I provided 9 minutes of non-face-to-face time during this encounter.   Freeman Caldron, PA-C  Patient ID: Luke [name], male   DOB: 1981-02-16, 39 y.o.   MRN: [id number]

## 2019-06-26 ENCOUNTER — Ambulatory Visit: Payer: Commercial Managed Care - PPO | Admitting: Physician Assistant

## 2019-07-08 ENCOUNTER — Ambulatory Visit (INDEPENDENT_AMBULATORY_CARE_PROVIDER_SITE_OTHER): Payer: Commercial Managed Care - PPO | Admitting: Family

## 2019-07-08 ENCOUNTER — Other Ambulatory Visit: Payer: Self-pay

## 2019-07-08 ENCOUNTER — Encounter: Payer: Self-pay | Admitting: Family

## 2019-07-08 VITALS — Ht 69.0 in | Wt 198.0 lb

## 2019-07-08 DIAGNOSIS — L97521 Non-pressure chronic ulcer of other part of left foot limited to breakdown of skin: Secondary | ICD-10-CM

## 2019-07-08 DIAGNOSIS — L819 Disorder of pigmentation, unspecified: Secondary | ICD-10-CM

## 2019-07-08 DIAGNOSIS — S51801D Unspecified open wound of right forearm, subsequent encounter: Secondary | ICD-10-CM

## 2019-07-08 NOTE — Progress Notes (Signed)
Office Visit Note   [name]           Date of Birth: 29-Mar-1980           MRN: [id number] Visit Date: 07/08/2019              Requested by: Gildardo Pounds, NP [address] [place],  [place] [address] PCP: Gildardo Pounds, NP  Chief Complaint  Patient presents with  . Right Forearm - Routine Post Op    05/22/19 I&D right forearm       HPI: Patient is a 39 year old gentleman who is seen today for 2 separate issues.  He is seen today in follow-up for ulceration to his second toe of his left foot on which he is also status post a left great toe amputation about 9 months ago.  He also has a wound to his right forearm following an I&D on March 5 of this year.  He has been doing mupirocin dressing changes of his foot he continues on doxycycline twice a day.      Denies fevers or chills  Assessment & Plan: Visit Diagnoses:  No diagnosis found.  Plan: Prisma dressing applied to his forearm as well as his toe ulcer on the plantar aspect.  He will cleanse these daily and was given a 2 week supply of dressing supplies.  Will refer to vascular to see if there is anything that they can do to optimize blood flow to his left foot  Follow-Up Instructions: No follow-ups on file.   Ortho Exam  Patient is alert, oriented, no adenopathy, well-dressed, normal affect, normal respiratory effort. On examination of the left foot he does not have any swelling of the foot or ankle he does have a ulcer to the lateral aspect of his second toe in the webspace is unchanged in size. this is wrapping around to the plantar aspect of his toe this is 10 mm x 5 mm 100% filled in with fibrinous tissue. there is no active drainage this does not probe to bone or tendon.  Very mild swelling of the toe with some darkening in color.  He has a dorsal ulcer now but is 5 mm in diameter this is completely covered with eschar there is no surrounding erythema or drainage Doppler able dampened pulse left  foot.  The forearm wound is about 15 mm in length and 10 mm in width this is superficial and 100% filled with granulation tissue there is no drainage or odor or surrounding erythema or warmth    Imaging: No results found. No images are attached to the encounter.  Labs: Lab Results  Component Value Date   HGBA1C 10.9 (H) 05/20/2019   HGBA1C 10.0 (H) 03/19/2019   HGBA1C 12.7 (A) 10/01/2018   ESRSEDRATE 62 (H) 05/19/2019   ESRSEDRATE 55 (H) 05/16/2019   ESRSEDRATE 55 (H) 03/19/2019   CRP 0.6 05/16/2019   CRP 3.8 (H) 03/19/2019   CRP 9.0 (H) 11/23/2017   LABURIC 5.2 06/23/2019   LABURIC 3.9 05/28/2019   REPTSTATUS 05/24/2019 FINAL 05/23/2019   GRAMSTAIN  05/22/2019    FEW WBC PRESENT, PREDOMINANTLY PMN NO ORGANISMS SEEN    CULT  05/23/2019    NO GROWTH Performed at [facility], [place] [address], [place], [place] [address]    LABORGA STREPTOCOCCUS MITIS/ORALIS 05/08/2019     Lab Results  Component Value Date   ALBUMIN 3.7 (L) 06/23/2019   ALBUMIN 2.1 (L) 05/24/2019   ALBUMIN  2.2 (L) 05/23/2019   LABURIC 5.2 06/23/2019   LABURIC 3.9 05/28/2019    Lab Results  Component Value Date   MG 1.8 05/16/2019   MG 1.7 05/15/2019   MG 1.8 05/14/2019   No results found for: VD25OH  No results found for: PREALBUMIN CBC EXTENDED Latest Ref Rng & Units 05/28/2019 05/27/2019 05/26/2019  WBC 4.0 - 10.5 K/uL 13.2(H) 13.0(H) -  RBC 4.22 - 5.81 MIL/uL 3.29(L) 3.15(L) -  HGB 13.0 - 17.0 g/dL 7.7(L) 7.3(L) 7.3(L)  HCT 39.0 - 52.0 % 25.5(L) 24.1(L) 22.8(L)  PLT 150 - 400 K/uL 571(H) 519(H) -  NEUTROABS 1.7 - 7.7 K/uL 8.3(H) - -  LYMPHSABS 0.7 - 4.0 K/uL 3.3 - -     Body mass index is 29.24 kg/m.  Orders:  No orders of the defined types were placed in this encounter.  No orders of the defined types were placed in this encounter.    Procedures: No procedures performed  Clinical Data: No additional findings.  ROS:  All other systems negative, except as noted in the  HPI. Review of Systems  Constitutional: Negative for chills and fever.  Cardiovascular: Negative for leg swelling.  Skin: Positive for wound. Negative for color change.    Objective: Vital Signs: Ht 5\' 9"  (1.753 m)   Wt 198 lb (89.8 kg)   BMI 29.24 kg/m   Specialty Comments:  No specialty comments available.  PMFS History: Patient Active Problem List   Diagnosis Date Noted  . Abscess of right forearm   . Endocarditis 05/20/2019  . Acute bacterial endocarditis   . Sepsis ([place]) 05/09/2019  . Heroin abuse ([place]) 05/09/2019  . Right forearm cellulitis   . Hypothyroidism   . Subacute osteomyelitis, left ankle and foot ([place])   . Cutaneous abscess of left foot 09/04/2018  . Diabetic ulcer of left foot ([place]) 08/28/2018  . Acute hematogenous osteomyelitis, left ankle and foot ([place])   . Streptococcal bacteremia 11/25/2017  . Hepatitis C 11/25/2017  . Actinomyces infection   . GERD (gastroesophageal reflux disease) 07/21/2017  . Diabetic polyneuropathy associated with type 1 diabetes mellitus ([place])   . Prostatitis, acute   . MRSA infection   . Noncompliant bladder   . Urinary retention   . Prostate abscess   . ARF (acute renal failure) ([place]) 12/16/2016  . Anemia 12/16/2016  . Cocaine abuse ([place]) 08/16/2015  . Uncontrolled insulin dependent type 1 diabetes mellitus ([place]) 08/16/2015  . Cellulitis 12/03/2014  . AKI (acute kidney injury) ([place]) 12/03/2014  . Elevated liver enzymes 07/26/2014  . Dental caries 07/23/2014  . Tinea pedis 07/23/2014  . DKA (diabetic ketoacidoses) ([place]) 11/23/2013  . Abscess 10/14/2012  . CKD (chronic kidney disease) stage 3, GFR 30-59 ml/min 10/14/2012  . 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 Tobacco abuse 10/11/2012  . Essential hypertension 10/11/2012   Past Medical History:  Diagnosis Date  . Anemia   . Anxiety  Dx 2008  . Chronic kidney disease   . Diabetes mellitus without complication ([place]) Dx 9629  . DKA (diabetic ketoacidoses) ([place])  07/21/2017  . GERD (gastroesophageal reflux disease) Dx 2008  . Headache(784.0)   . Hypertension   . Pneumonia   . Seizures ([place]) 2011    x 2 in lifetime. on Dilantin for a while.   . Substance abuse ([place]) 2013    heroin use, multiple relapses    Family History  Problem Relation Age of Onset  . Diabetes Father   . Heart disease Father   .  Mental illness Sister   . Cancer Neg Hx     Past Surgical History:  Procedure Laterality Date  . AMPUTATION Left 09/05/2018   Procedure: LEFT GREAT TOE AMPUTATION;  Surgeon: [name], MD;  Location: [facility];  Service: Orthopedics;  Laterality: Left;  . I & D EXTREMITY Left 10/11/2012   Procedure: IRRIGATION AND DEBRIDEMENT ABSCESS FOREARM;  Surgeon: Linna Hoff, MD;  Location: [facility];  Service: Orthopedics;  Laterality: Left;  . I & D EXTREMITY Left 10/12/2012   Procedure: IRRIGATION AND DEBRIDEMENT FOREARM;  Surgeon: Linna Hoff, MD;  Location: [facility];  Service: Orthopedics;  Laterality: Left;  . I & D EXTREMITY Left 10/14/2012   Procedure: incision and drainage left forearm;  Surgeon: Linna Hoff, MD;  Location: [facility];  Service: Orthopedics;  Laterality: Left;  . I & D EXTREMITY Left 10/16/2012   Procedure: IRRIGATION AND DEBRIDEMENT LEFT FOREARM;  Surgeon: Linna Hoff, MD;  Location: [facility];  Service: Orthopedics;  Laterality: Left;  . I & D EXTREMITY Left 10/20/2012   Procedure: INCISION AND DRAINAGE AND DEBRIDEMENT LEFT  FOREARM;  Surgeon: Linna Hoff, MD;  Location: [facility];  Service: Orthopedics;  Laterality: Left;  . I & D EXTREMITY Right 05/22/2019   Procedure: IRRIGATION AND DEBRIDEMENT RIGHT ARM;  Surgeon: [name], MD;  Location: [facility];  Service: Orthopedics;  Laterality: Right;  . INCISION AND DRAINAGE OF WOUND Bilateral 11/29/2017   Procedure: DEBRIDEMENT BILATERAL FEET, DEBRIDEMENT LEFT ANKLE, AND APPLY WOUND VAC;  Surgeon: [name], MD;  Location: [facility];  Service: Orthopedics;  Laterality: Bilateral;  . IRRIGATION  AND DEBRIDEMENT ABSCESS     Hx: of left arm abscess related to drug use   . TEE WITHOUT CARDIOVERSION N/A 11/28/2017   Procedure: TRANSESOPHAGEAL ECHOCARDIOGRAM (TEE);  Surgeon: Sanda Klein, MD;  Location: [facility];  Service: Cardiovascular;  Laterality: N/A;  . TEE WITHOUT CARDIOVERSION N/A 05/12/2019   Procedure: TRANSESOPHAGEAL ECHOCARDIOGRAM (TEE);  Surgeon: Skeet Latch, MD;  Location: [facility];  Service: Cardiovascular;  Laterality: N/A;   Social History   Occupational History  . Occupation: Event set up    Employer: [place]   . Occupation: Stencil for cars   Tobacco Use  . Smoking status: Current Every Day Smoker    Packs/day: 1.00    Types: Cigarettes  . Smokeless tobacco: Never Used  Substance and Sexual Activity  . Alcohol use: No    Alcohol/week: 0.0 standard drinks    Comment: occasional  . Drug use: Not Currently    Types: Cocaine, IV, Heroin    Comment: IVDU since Dad died in Jun 26, 2006 with 15 month period of sobriety 2018-2019, relapse Aug 2019.  Marland Kitchen Sexual activity: Yes    Partners: Female    Birth control/protection: None

## 2019-07-10 ENCOUNTER — Ambulatory Visit (HOSPITAL_COMMUNITY)
Admission: RE | Admit: 2019-07-10 | Discharge: 2019-07-10 | Disposition: A | Discharge status: Home / Self Care | Payer: Commercial Managed Care - PPO | Source: Ambulatory Visit | Attending: Family | Admitting: Family

## 2019-07-10 ENCOUNTER — Other Ambulatory Visit: Payer: Self-pay

## 2019-07-10 DIAGNOSIS — L97521 Non-pressure chronic ulcer of other part of left foot limited to breakdown of skin: Secondary | ICD-10-CM | POA: Diagnosis not present

## 2019-07-10 DIAGNOSIS — L819 Disorder of pigmentation, unspecified: Secondary | ICD-10-CM

## 2019-07-22 ENCOUNTER — Ambulatory Visit: Payer: Commercial Managed Care - PPO | Admitting: Family

## 2019-07-23 ENCOUNTER — Encounter: Payer: Self-pay | Admitting: Nurse Practitioner

## 2019-07-24 ENCOUNTER — Other Ambulatory Visit: Payer: Self-pay

## 2019-07-24 ENCOUNTER — Ambulatory Visit (INDEPENDENT_AMBULATORY_CARE_PROVIDER_SITE_OTHER): Payer: Self-pay | Admitting: Family

## 2019-07-24 ENCOUNTER — Encounter: Payer: Self-pay | Admitting: Family

## 2019-07-24 VITALS — Ht 69.0 in | Wt 198.0 lb

## 2019-07-24 DIAGNOSIS — L819 Disorder of pigmentation, unspecified: Secondary | ICD-10-CM

## 2019-07-24 DIAGNOSIS — L97521 Non-pressure chronic ulcer of other part of left foot limited to breakdown of skin: Secondary | ICD-10-CM

## 2019-07-24 NOTE — Progress Notes (Signed)
Office Visit Note   [name]           Date of Birth: May 03, 1980           MRN: [id number] Visit Date: 07/24/2019              Requested by: Gildardo Pounds, NP [address] [place],  [place] [address] PCP: Gildardo Pounds, NP  Chief Complaint  Patient presents with  . Right Forearm - Routine Post Op    05/22/19 I+D right forearm  . Left Foot - Follow-up    Second toe ulcer s/p ABIs 07/10/19      HPI: The patient is a 39 year old gentleman who presents today in follow-up for his left foot.  He has had plantar ulceration in the MTP joint of his second toe this is been ongoing for about 4 weeks.  He also unfortunately has a dorsal toe ulcer of the same toe over the IP joint.  Today he has no dressing in place.  Of note he has been seen by vascular ABIs showed moderate left lower extremity arterial disease  Assessment & Plan: Visit Diagnoses: No diagnosis found.  Plan: He will begin using the nitroglycerin patches that he has at home.  Continue with mupirocin dressing changes daily.  Minimize pressure to the toe. Concerned he may lose the second toe, we discussed this at length.   Follow-Up Instructions: No follow-ups on file.   Ortho Exam  Patient is alert, oriented, no adenopathy, well-dressed, normal affect, normal respiratory effort. On examination of the left foot the darkness and discoloration to the left second toe is improved.  He does have a dorsal ulcer over the PIP joint this is 5 x 2 mm and covered with dry fibrinous tissue.  There is no surrounding erythema. Appears to epithelializing in. Plantar ulcer to the MTP joint, 50% fibrinous tissue. Is 15 mm x 5 mm. Does not probe.   Imaging: No results found. No images are attached to the encounter.  Labs: Lab Results  Component Value Date   HGBA1C 10.9 (H) 05/20/2019   HGBA1C 10.0 (H) 03/19/2019   HGBA1C 12.7 (A) 10/01/2018   ESRSEDRATE 62 (H) 05/19/2019   ESRSEDRATE 55 (H) 05/16/2019   ESRSEDRATE  55 (H) 03/19/2019   CRP 0.6 05/16/2019   CRP 3.8 (H) 03/19/2019   CRP 9.0 (H) 11/23/2017   LABURIC 5.2 06/23/2019   LABURIC 3.9 05/28/2019   REPTSTATUS 05/24/2019 FINAL 05/23/2019   GRAMSTAIN  05/22/2019    FEW WBC PRESENT, PREDOMINANTLY PMN NO ORGANISMS SEEN    CULT  05/23/2019    NO GROWTH Performed at [facility], [place] [address] [place], [place] [address]    LABORGA STREPTOCOCCUS MITIS/ORALIS 05/08/2019     Lab Results  Component Value Date   ALBUMIN 3.7 (L) 06/23/2019   ALBUMIN 2.1 (L) 05/24/2019   ALBUMIN 2.2 (L) 05/23/2019   LABURIC 5.2 06/23/2019   LABURIC 3.9 05/28/2019    Lab Results  Component Value Date   MG 1.8 05/16/2019   MG 1.7 05/15/2019   MG 1.8 05/14/2019   No results found for: VD25OH  No results found for: PREALBUMIN CBC EXTENDED Latest Ref Rng & Units 05/28/2019 05/27/2019 05/26/2019  WBC 4.0 - 10.5 K/uL 13.2(H) 13.0(H) -  RBC 4.22 - 5.81 MIL/uL 3.29(L) 3.15(L) -  HGB 13.0 - 17.0 g/dL 7.7(L) 7.3(L) 7.3(L)  HCT 39.0 - 52.0 % 25.5(L) 24.1(L) 22.8(L)  PLT 150 - 400 K/uL 571(H)  519(H) -  NEUTROABS 1.7 - 7.7 K/uL 8.3(H) - -  LYMPHSABS 0.7 - 4.0 K/uL 3.3 - -     Body mass index is 29.24 kg/m.  Orders:  No orders of the defined types were placed in this encounter.  No orders of the defined types were placed in this encounter.    Procedures: No procedures performed  Clinical Data: No additional findings.  ROS:  All other systems negative, except as noted in the HPI. Review of Systems  Objective: Vital Signs: Ht 5\' 9"  (1.753 m)   Wt 198 lb (89.8 kg)   BMI 29.24 kg/m   Specialty Comments:  No specialty comments available.  PMFS History: Patient Active Problem List   Diagnosis Date Noted  . Abscess of right forearm   . Endocarditis 05/20/2019  . Acute bacterial endocarditis   . Sepsis ([place]) 05/09/2019  . Heroin abuse ([place]) 05/09/2019  . Right forearm cellulitis   . Hypothyroidism   . Subacute osteomyelitis, left  ankle and foot ([place])   . Cutaneous abscess of left foot 09/04/2018  . Diabetic ulcer of left foot ([place]) 08/28/2018  . Acute hematogenous osteomyelitis, left ankle and foot ([place])   . Streptococcal bacteremia 11/25/2017  . Hepatitis C 11/25/2017  . Actinomyces infection   . GERD (gastroesophageal reflux disease) 07/21/2017  . Diabetic polyneuropathy associated with type 1 diabetes mellitus ([place])   . Prostatitis, acute   . MRSA infection   . Noncompliant bladder   . Urinary retention   . Prostate abscess   . ARF (acute renal failure) ([place]) 12/16/2016  . Anemia 12/16/2016  . Cocaine abuse ([place]) 08/16/2015  . Uncontrolled insulin dependent type 1 diabetes mellitus ([place]) 08/16/2015  . Cellulitis 12/03/2014  . AKI (acute kidney injury) ([place]) 12/03/2014  . Elevated liver enzymes 07/26/2014  . Dental caries 07/23/2014  . Tinea pedis 07/23/2014  . DKA (diabetic ketoacidoses) ([place]) 11/23/2013  . Abscess 10/14/2012  . CKD (chronic kidney disease) stage 3, GFR 30-59 ml/min 10/14/2012  . 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 Tobacco abuse 10/11/2012  . Essential hypertension 10/11/2012   Past Medical History:  Diagnosis Date  . Anemia   . Anxiety  Dx 2008  . Chronic kidney disease   . Diabetes mellitus without complication ([place]) Dx 1245  . DKA (diabetic ketoacidoses) ([place]) 07/21/2017  . GERD (gastroesophageal reflux disease) Dx 2008  . Headache(784.0)   . Hypertension   . Pneumonia   . Seizures ([place]) 2011    x 2 in lifetime. on Dilantin for a while.   . Substance abuse ([place]) 2013    heroin use, multiple relapses    Family History  Problem Relation Age of Onset  . Diabetes Father   . Heart disease Father   . Mental illness Sister   . Cancer Neg Hx     Past Surgical History:  Procedure Laterality Date  . AMPUTATION Left 09/05/2018   Procedure: LEFT GREAT TOE AMPUTATION;  Surgeon: [name], MD;  Location: [place];  Service: Orthopedics;  Laterality: Left;  . I & D EXTREMITY  Left 10/11/2012   Procedure: IRRIGATION AND DEBRIDEMENT ABSCESS FOREARM;  Surgeon: Linna Hoff, MD;  Location: [place];  Service: Orthopedics;  Laterality: Left;  . I & D EXTREMITY Left 10/12/2012   Procedure: IRRIGATION AND DEBRIDEMENT FOREARM;  Surgeon: Linna Hoff, MD;  Location: [place];  Service: Orthopedics;  Laterality: Left;  . I & D EXTREMITY Left 10/14/2012   Procedure: incision and  drainage left forearm;  Surgeon: Linna Hoff, MD;  Location: [place];  Service: Orthopedics;  Laterality: Left;  . I & D EXTREMITY Left 10/16/2012   Procedure: IRRIGATION AND DEBRIDEMENT LEFT FOREARM;  Surgeon: Linna Hoff, MD;  Location: [place];  Service: Orthopedics;  Laterality: Left;  . I & D EXTREMITY Left 10/20/2012   Procedure: INCISION AND DRAINAGE AND DEBRIDEMENT LEFT  FOREARM;  Surgeon: Linna Hoff, MD;  Location: [place];  Service: Orthopedics;  Laterality: Left;  . I & D EXTREMITY Right 05/22/2019   Procedure: IRRIGATION AND DEBRIDEMENT RIGHT ARM;  Surgeon: [name], MD;  Location: [place] [facility];  Service: Orthopedics;  Laterality: Right;  . INCISION AND DRAINAGE OF WOUND Bilateral 11/29/2017   Procedure: DEBRIDEMENT BILATERAL FEET, DEBRIDEMENT LEFT ANKLE, AND APPLY WOUND VAC;  Surgeon: [name], MD;  Location: [facility];  Service: Orthopedics;  Laterality: Bilateral;  . IRRIGATION AND DEBRIDEMENT ABSCESS     Hx: of left arm abscess related to drug use   . TEE WITHOUT CARDIOVERSION N/A 11/28/2017   Procedure: TRANSESOPHAGEAL ECHOCARDIOGRAM (TEE);  Surgeon: Sanda Klein, MD;  Location: [place];  Service: Cardiovascular;  Laterality: N/A;  . TEE WITHOUT CARDIOVERSION N/A 05/12/2019   Procedure: TRANSESOPHAGEAL ECHOCARDIOGRAM (TEE);  Surgeon: Skeet Latch, MD;  Location: [place];  Service: Cardiovascular;  Laterality: N/A;   Social History   Occupational History  . Occupation: Event set up    Employer: [place]   . Occupation: Stencil for cars    Tobacco Use  . Smoking status: Current Every Day Smoker    Packs/day: 1.00    Types: Cigarettes  . Smokeless tobacco: Never Used  Substance and Sexual Activity  . Alcohol use: No    Alcohol/week: 0.0 standard drinks    Comment: occasional  . Drug use: Not Currently    Types: Cocaine, IV, Heroin    Comment: IVDU since Dad died in 2006-06-12 with 15 month period of sobriety 2018-2019, relapse Aug 2019.  Marland Kitchen Sexual activity: Yes    Partners: Female    Birth control/protection: None

## 2019-08-07 ENCOUNTER — Other Ambulatory Visit: Payer: Self-pay | Admitting: Nurse Practitioner

## 2019-08-07 DIAGNOSIS — F331 Major depressive disorder, recurrent, moderate: Secondary | ICD-10-CM

## 2019-08-07 MED FILL — ?BASAGLAR 100 UNITS/ML KWPE: 100 | 25 days supply | Qty: 15 | Fill #1

## 2019-08-07 MED FILL — ?HUMALOG 100 UNITS/ML KWIKP: 100 | 25 days supply | Qty: 15 | Fill #2

## 2019-08-07 MED FILL — ?TRAZODONE HCL 50 TABS: 50 | 30 days supply | Qty: 30 | Fill #0

## 2019-08-07 MED FILL — SERTRALINE HCL 50 MG TABLET: 50 | 30 days supply | Qty: 30 | Fill #0

## 2019-08-13 ENCOUNTER — Ambulatory Visit: Payer: Medicaid Other | Admitting: Physician Assistant

## 2019-08-15 IMAGING — DX DG FOOT COMPLETE 3+V*R*
3 series · 3 of 3 positions shown · non-contrast
Comparison: None

CLINICAL DATA: Bilateral foot pain for 4-5 days,
left-greater-than-right with swelling and redness, the patient is
diabetic and does have a smoking history

EXAM:
RIGHT FOOT COMPLETE - 3+ VIEW

[foot ap]
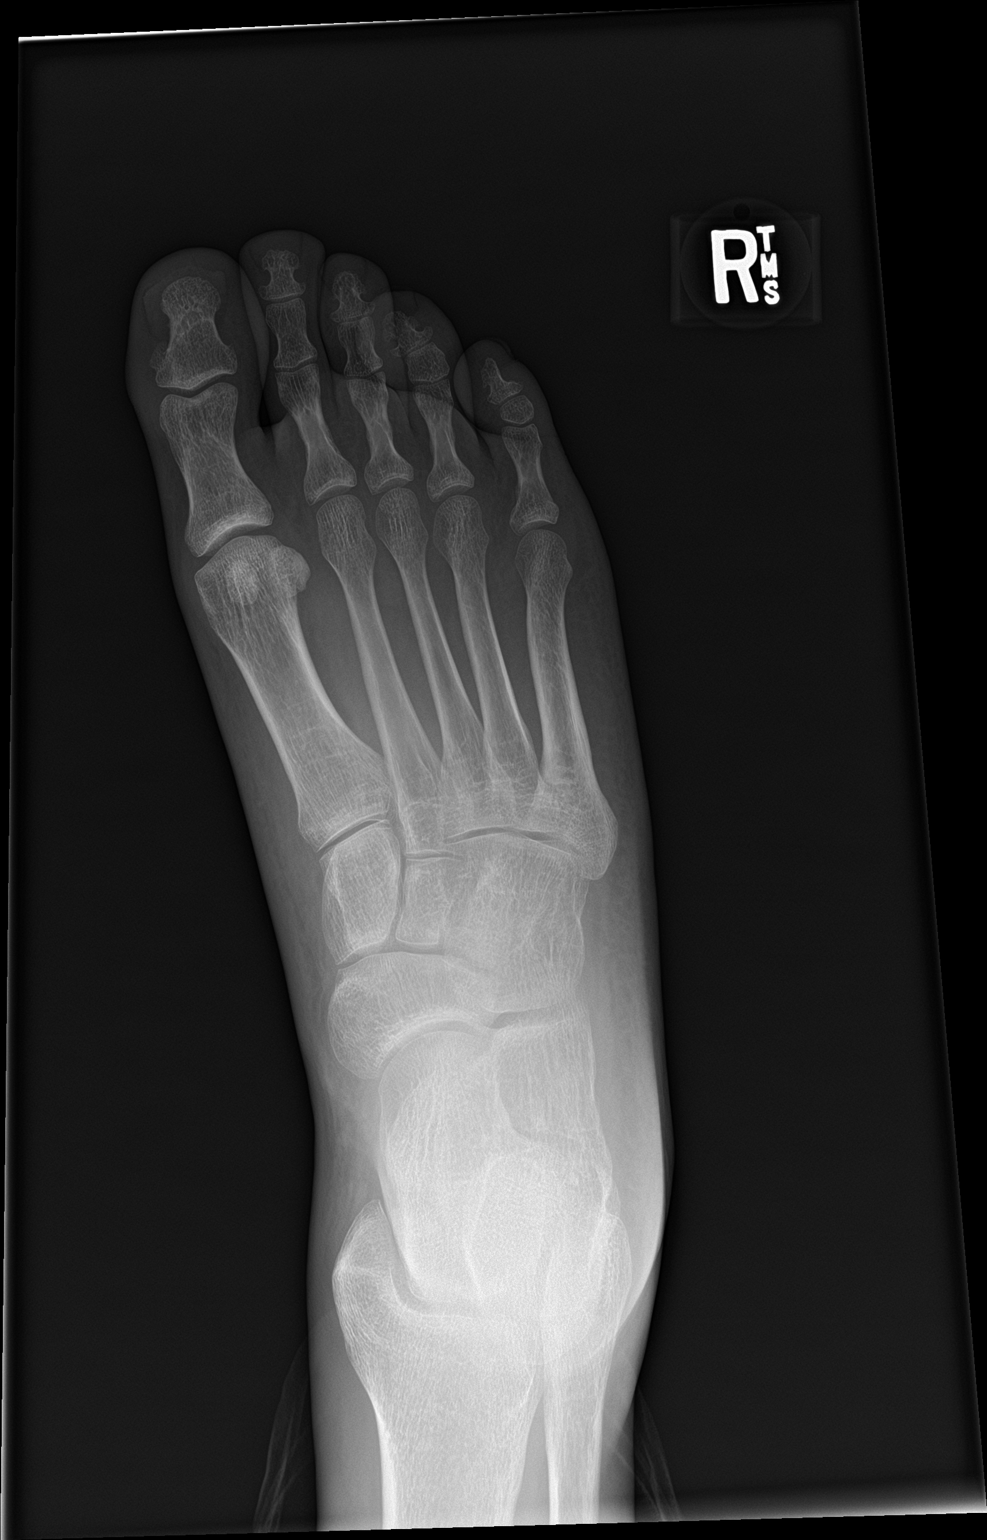

[foot obl]
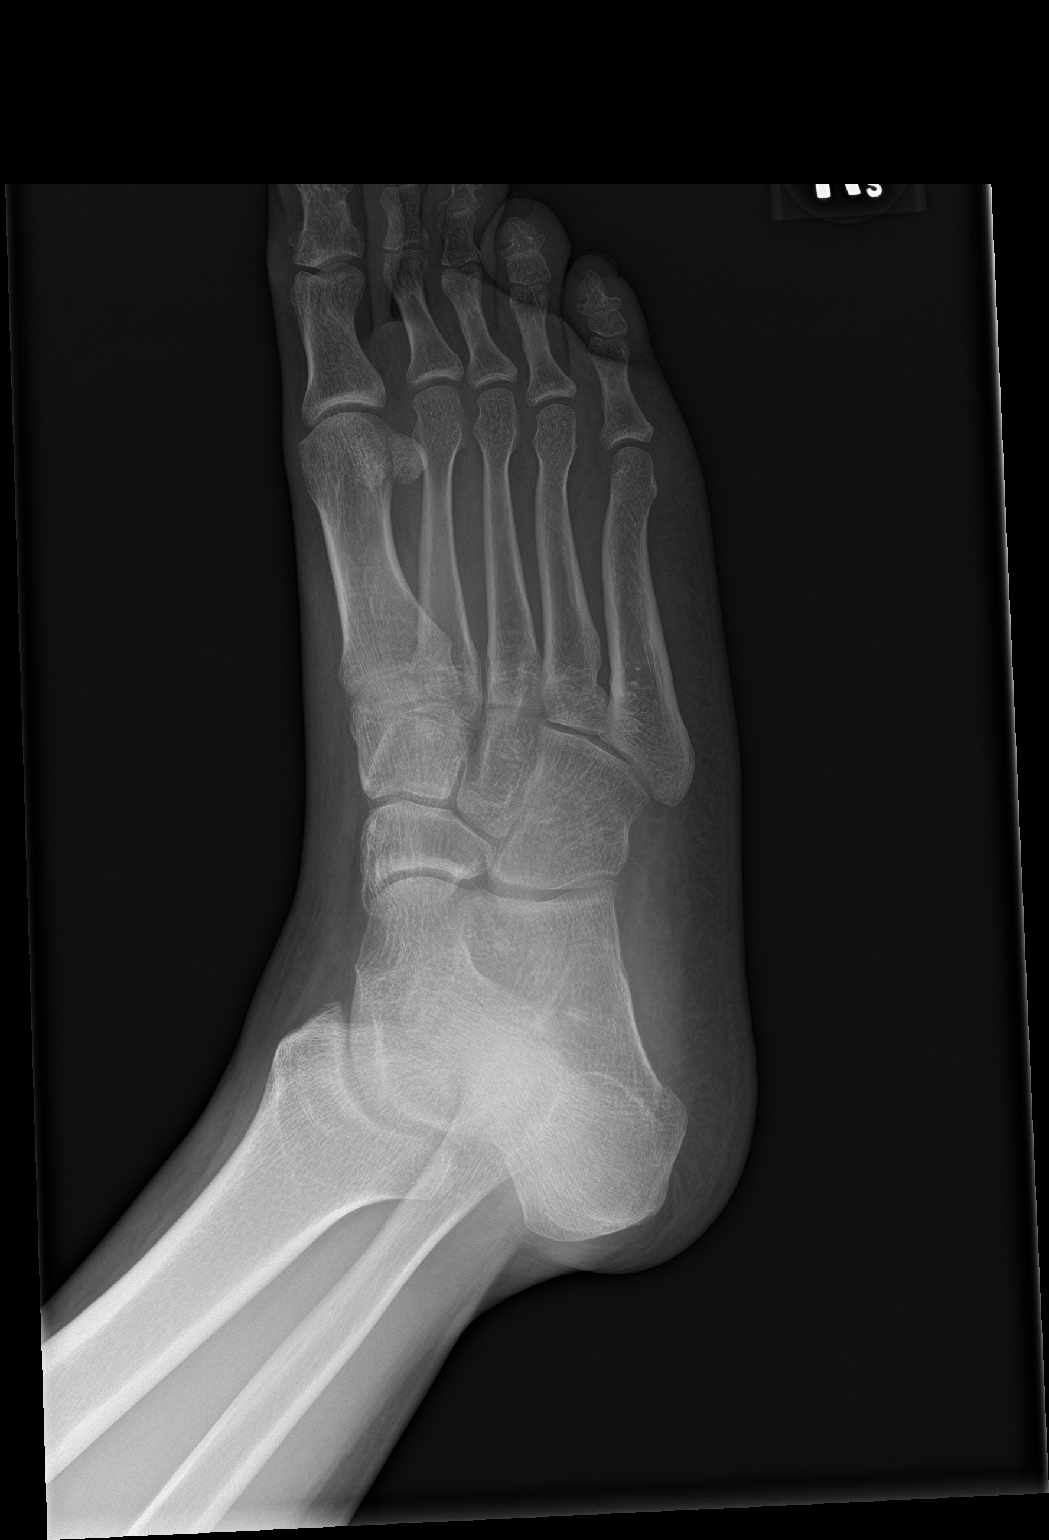

[foot lat]
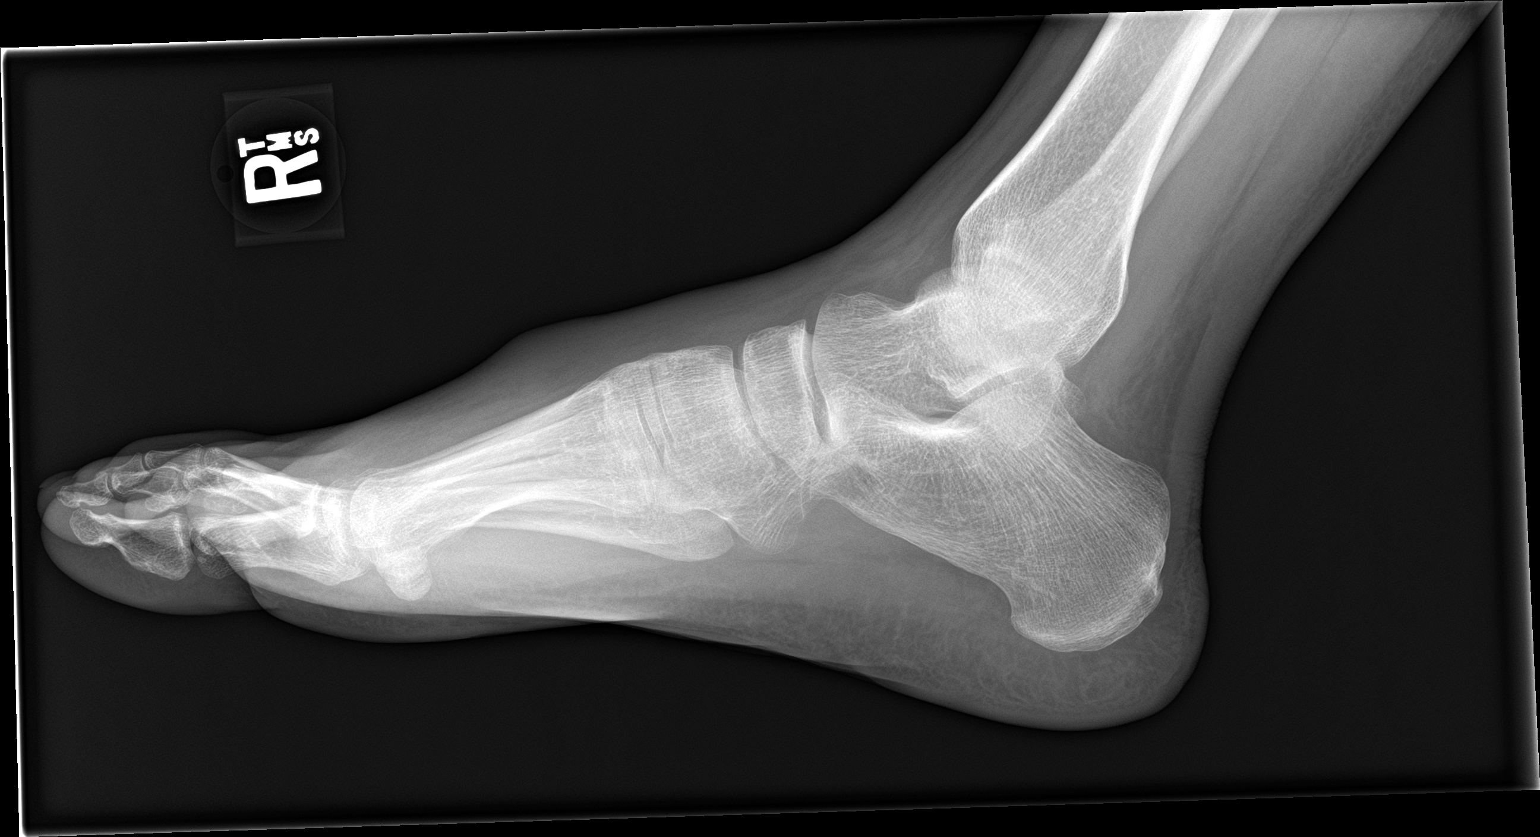

[3 of 3 positions shown; findings below may reference images not displayed]

FINDINGS: There is mild soft tissue swelling over the dorsum of the right
foot. No underlying bony demineralization or erosion is seen to
indicate active osteomyelitis. Alignment is normal and joint spaces
appear normal.
IMPRESSION: Soft tissue swelling over the dorsum. No evidence of active
osteomyelitis.

## 2019-08-15 IMAGING — DX DG FOOT COMPLETE 3+V*L*
3 series · 3 of 3 positions shown · non-contrast
Comparison: None.

CLINICAL DATA: Bilateral foot pain for 4-5 days,
left-greater-than-right, swelling and open sores, the patient is
diabetic, and has a smoking history

EXAM:
LEFT FOOT - COMPLETE 3+ VIEW

[foot ap]
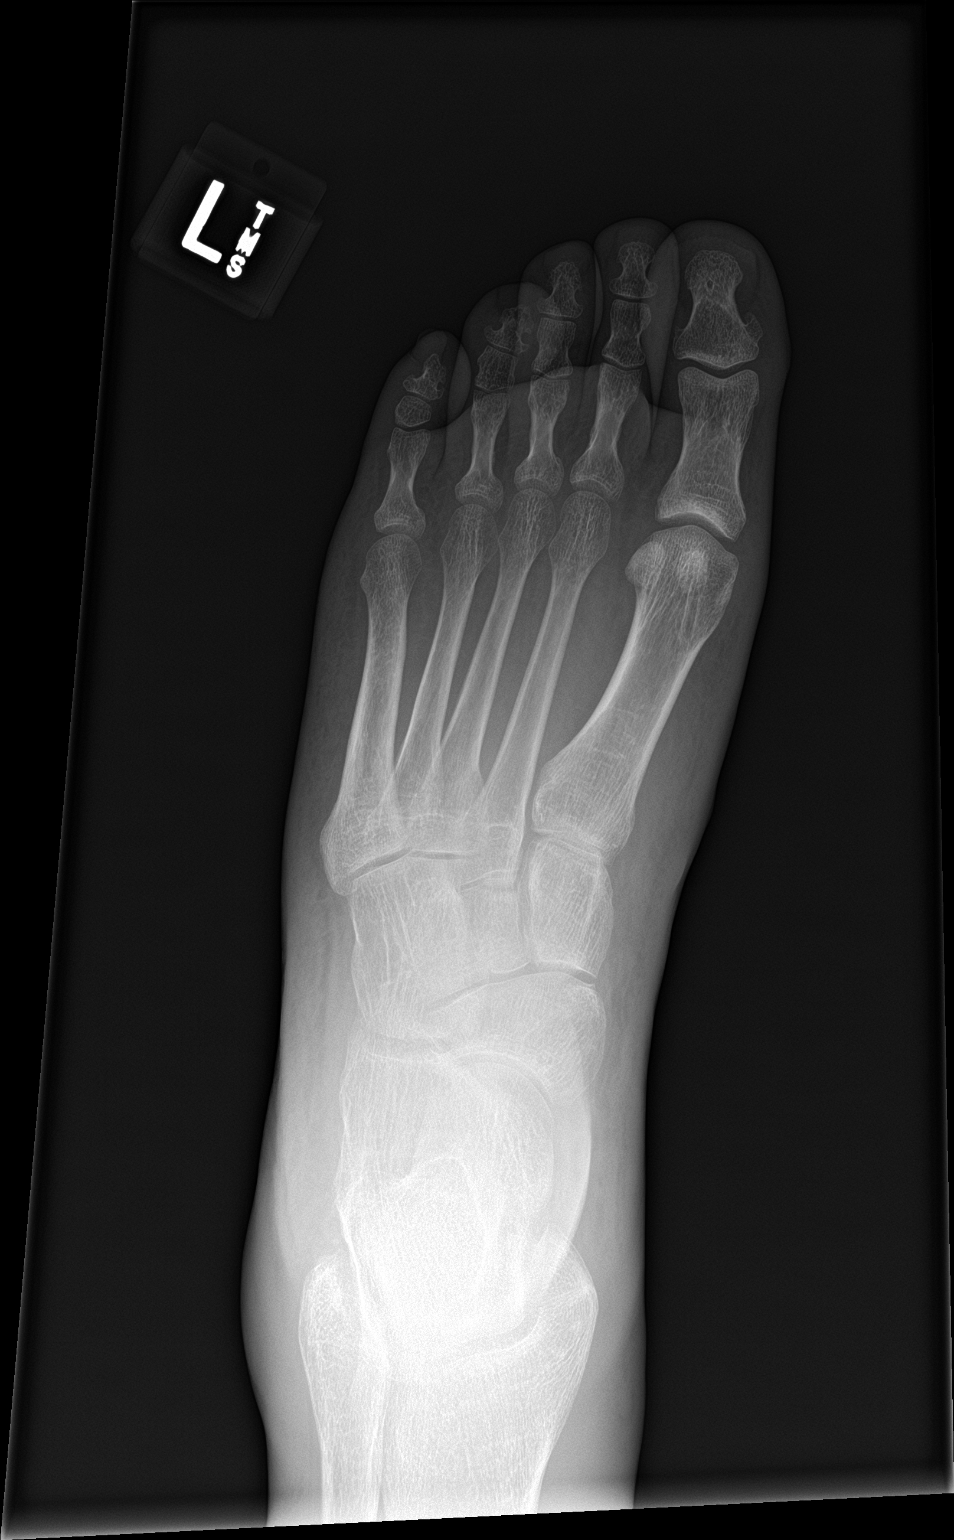

[foot obl]
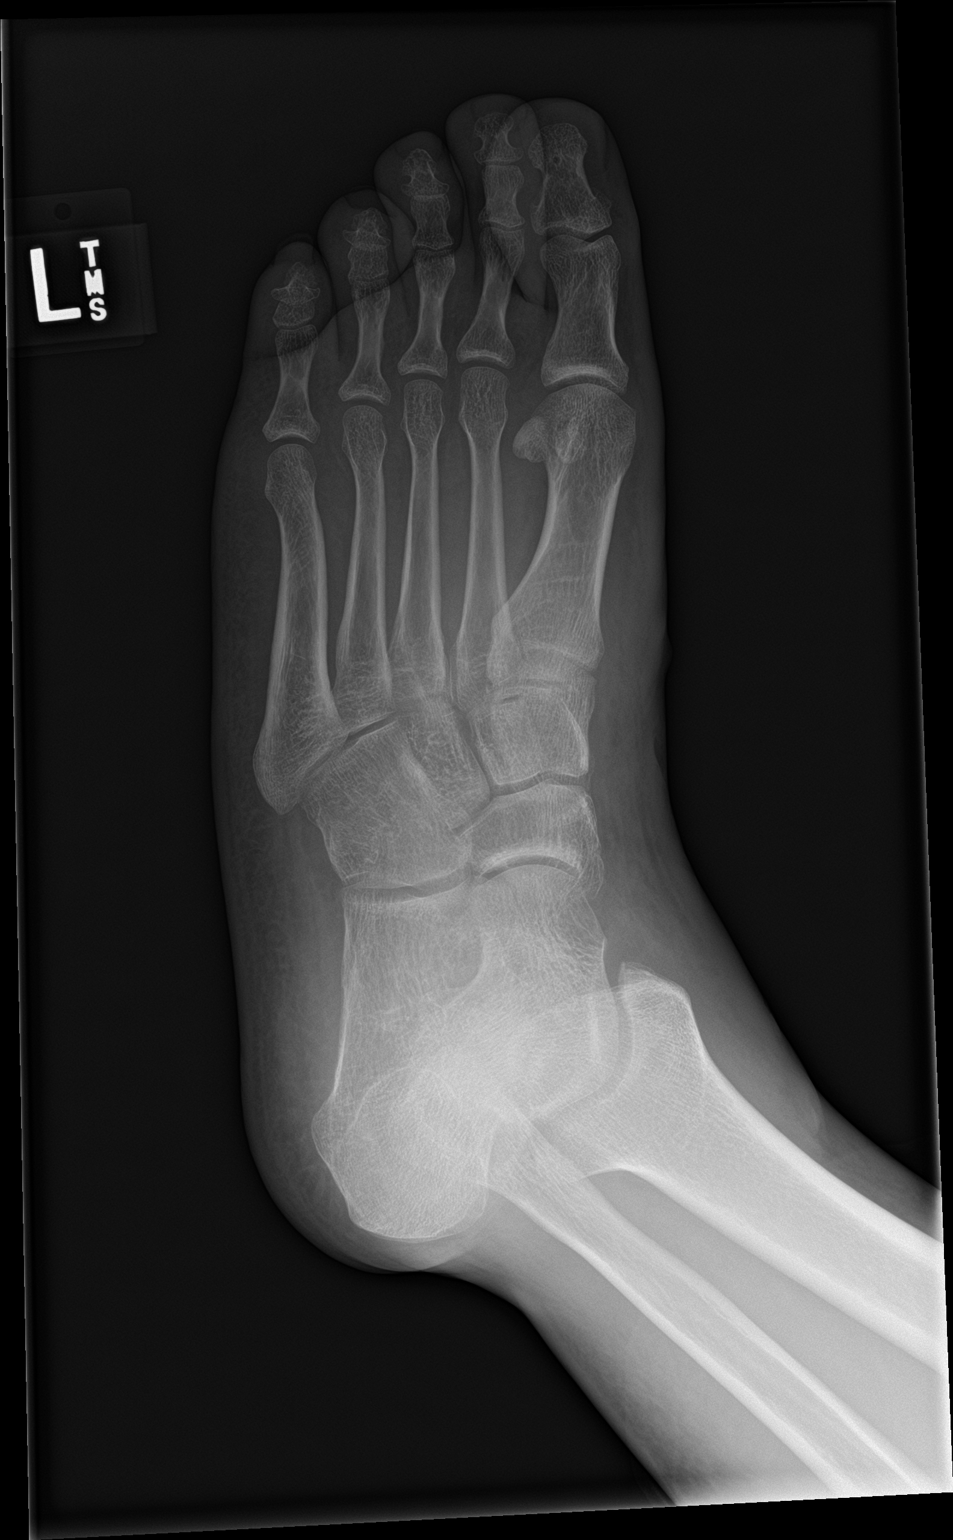

[foot lat]
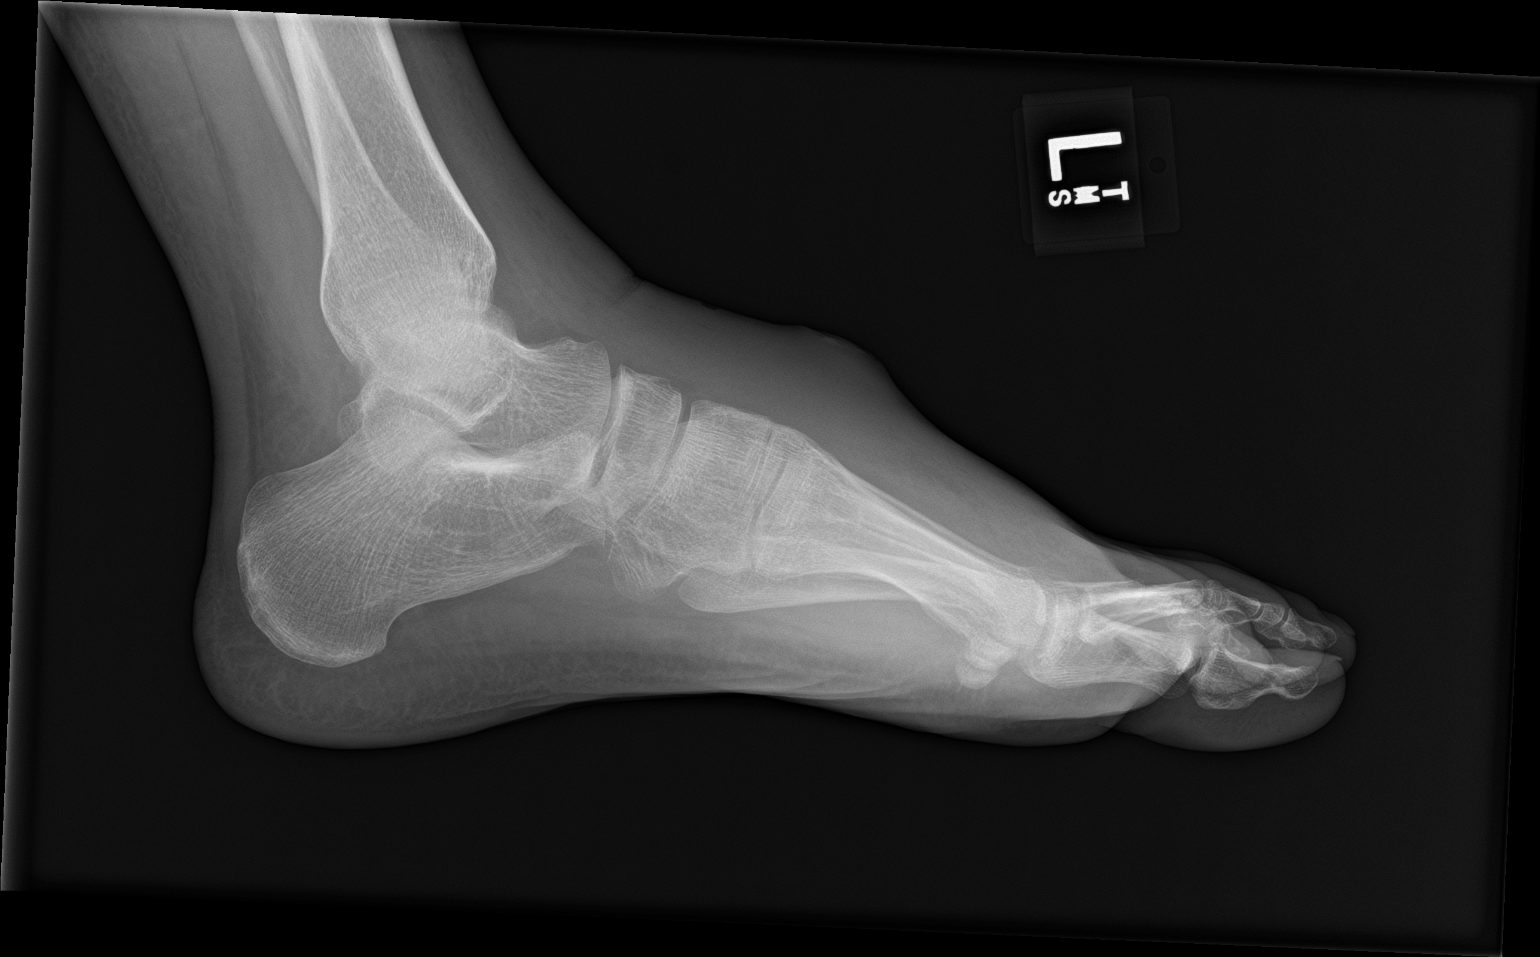

[3 of 3 positions shown; findings below may reference images not displayed]

FINDINGS: There is soft tissue swelling over the dorsum of the left foot.
However no underlying focal demineralization or erosion is seen to
indicate osteomyelitis. No acute abnormality is noted. Joint spaces
appear normal. No erosion is seen.
IMPRESSION: Soft tissue swelling over the dorsum of the foot. No underlying bony
abnormality.

## 2019-08-19 ENCOUNTER — Other Ambulatory Visit: Payer: Self-pay

## 2019-08-19 ENCOUNTER — Emergency Department (HOSPITAL_COMMUNITY): Payer: Medicaid Other

## 2019-08-19 ENCOUNTER — Inpatient Hospital Stay (HOSPITAL_COMMUNITY)
Admission: EM | Admit: 2019-08-19 | Discharge: 2019-08-21 | DRG: 638 | Discharge status: Against Medical Advice | Payer: Medicaid Other | Attending: Internal Medicine | Admitting: Internal Medicine

## 2019-08-19 ENCOUNTER — Encounter (HOSPITAL_COMMUNITY): Payer: Self-pay

## 2019-08-19 DIAGNOSIS — Z79899 Other long term (current) drug therapy: Secondary | ICD-10-CM

## 2019-08-19 DIAGNOSIS — R778 Other specified abnormalities of plasma proteins: Secondary | ICD-10-CM | POA: Diagnosis present

## 2019-08-19 DIAGNOSIS — L98498 Non-pressure chronic ulcer of skin of other sites with other specified severity: Secondary | ICD-10-CM | POA: Diagnosis present

## 2019-08-19 DIAGNOSIS — N1831 Chronic kidney disease, stage 3a: Secondary | ICD-10-CM | POA: Diagnosis present

## 2019-08-19 DIAGNOSIS — Z833 Family history of diabetes mellitus: Secondary | ICD-10-CM

## 2019-08-19 DIAGNOSIS — I129 Hypertensive chronic kidney disease with stage 1 through stage 4 chronic kidney disease, or unspecified chronic kidney disease: Secondary | ICD-10-CM | POA: Diagnosis present

## 2019-08-19 DIAGNOSIS — L03114 Cellulitis of left upper limb: Secondary | ICD-10-CM | POA: Diagnosis present

## 2019-08-19 DIAGNOSIS — E10628 Type 1 diabetes mellitus with other skin complications: Principal | ICD-10-CM | POA: Diagnosis present

## 2019-08-19 DIAGNOSIS — L97529 Non-pressure chronic ulcer of other part of left foot with unspecified severity: Secondary | ICD-10-CM | POA: Diagnosis present

## 2019-08-19 DIAGNOSIS — R748 Abnormal levels of other serum enzymes: Secondary | ICD-10-CM

## 2019-08-19 DIAGNOSIS — F1110 Opioid abuse, uncomplicated: Secondary | ICD-10-CM | POA: Diagnosis present

## 2019-08-19 DIAGNOSIS — Z8679 Personal history of other diseases of the circulatory system: Secondary | ICD-10-CM

## 2019-08-19 DIAGNOSIS — E039 Hypothyroidism, unspecified: Secondary | ICD-10-CM | POA: Diagnosis present

## 2019-08-19 DIAGNOSIS — E10621 Type 1 diabetes mellitus with foot ulcer: Secondary | ICD-10-CM | POA: Diagnosis present

## 2019-08-19 DIAGNOSIS — N183 Chronic kidney disease, stage 3 unspecified: Secondary | ICD-10-CM | POA: Diagnosis present

## 2019-08-19 DIAGNOSIS — Z20822 Contact with and (suspected) exposure to covid-19: Secondary | ICD-10-CM | POA: Diagnosis present

## 2019-08-19 DIAGNOSIS — F17210 Nicotine dependence, cigarettes, uncomplicated: Secondary | ICD-10-CM | POA: Diagnosis present

## 2019-08-19 DIAGNOSIS — F1410 Cocaine abuse, uncomplicated: Secondary | ICD-10-CM | POA: Diagnosis present

## 2019-08-19 DIAGNOSIS — E1065 Type 1 diabetes mellitus with hyperglycemia: Secondary | ICD-10-CM | POA: Diagnosis present

## 2019-08-19 DIAGNOSIS — F1910 Other psychoactive substance abuse, uncomplicated: Secondary | ICD-10-CM | POA: Diagnosis present

## 2019-08-19 DIAGNOSIS — Z89412 Acquired absence of left great toe: Secondary | ICD-10-CM

## 2019-08-19 DIAGNOSIS — D631 Anemia in chronic kidney disease: Secondary | ICD-10-CM | POA: Diagnosis present

## 2019-08-19 DIAGNOSIS — Z79891 Long term (current) use of opiate analgesic: Secondary | ICD-10-CM

## 2019-08-19 DIAGNOSIS — L0390 Cellulitis, unspecified: Secondary | ICD-10-CM

## 2019-08-19 DIAGNOSIS — E1022 Type 1 diabetes mellitus with diabetic chronic kidney disease: Secondary | ICD-10-CM | POA: Diagnosis present

## 2019-08-19 DIAGNOSIS — F419 Anxiety disorder, unspecified: Secondary | ICD-10-CM | POA: Diagnosis present

## 2019-08-19 DIAGNOSIS — R0602 Shortness of breath: Secondary | ICD-10-CM

## 2019-08-19 DIAGNOSIS — Z8249 Family history of ischemic heart disease and other diseases of the circulatory system: Secondary | ICD-10-CM

## 2019-08-19 DIAGNOSIS — K219 Gastro-esophageal reflux disease without esophagitis: Secondary | ICD-10-CM | POA: Diagnosis present

## 2019-08-19 DIAGNOSIS — G8929 Other chronic pain: Secondary | ICD-10-CM | POA: Diagnosis present

## 2019-08-19 DIAGNOSIS — E109 Type 1 diabetes mellitus without complications: Secondary | ICD-10-CM | POA: Diagnosis present

## 2019-08-19 DIAGNOSIS — R7989 Other specified abnormal findings of blood chemistry: Secondary | ICD-10-CM | POA: Diagnosis present

## 2019-08-19 DIAGNOSIS — Z794 Long term (current) use of insulin: Secondary | ICD-10-CM

## 2019-08-19 DIAGNOSIS — Z79890 Hormone replacement therapy (postmenopausal): Secondary | ICD-10-CM

## 2019-08-19 DIAGNOSIS — Z7151 Drug abuse counseling and surveillance of drug abuser: Secondary | ICD-10-CM

## 2019-08-19 DIAGNOSIS — R079 Chest pain, unspecified: Secondary | ICD-10-CM

## 2019-08-19 DIAGNOSIS — F1990 Other psychoactive substance use, unspecified, uncomplicated: Secondary | ICD-10-CM

## 2019-08-19 LAB — CBC
HCT: 32 % — ABNORMAL LOW (ref 39.0–52.0)
Hemoglobin: 10 g/dL — ABNORMAL LOW (ref 13.0–17.0)
MCH: 25.7 pg — ABNORMAL LOW (ref 26.0–34.0)
MCHC: 31.3 g/dL (ref 30.0–36.0)
MCV: 82.3 fL (ref 80.0–100.0)
Platelets: 574 10*3/uL — ABNORMAL HIGH (ref 150–400)
RBC: 3.89 MIL/uL — ABNORMAL LOW (ref 4.22–5.81)
RDW: 14.5 % (ref 11.5–15.5)
WBC: 14.1 10*3/uL — ABNORMAL HIGH (ref 4.0–10.5)
nRBC: 0 % (ref 0.0–0.2)

## 2019-08-19 LAB — PROTIME-INR
INR: 1 (ref 0.8–1.2)
Prothrombin Time: 13 seconds (ref 11.4–15.2)

## 2019-08-19 NOTE — ED Triage Notes (Addendum)
Pt arrives POV for eval of CP and SOB x 1 week. Reports worse today, states took 1 of his sons breathing tx w/out improvement. Endorses ongoing CP worse w/ deep breath. Pt also w/ large open infected wound to posterior R arm, reports last IVDU yesterday

## 2019-08-20 ENCOUNTER — Encounter (HOSPITAL_COMMUNITY): Payer: Self-pay | Admitting: Family Medicine

## 2019-08-20 ENCOUNTER — Inpatient Hospital Stay (HOSPITAL_COMMUNITY): Payer: Medicaid Other

## 2019-08-20 ENCOUNTER — Inpatient Hospital Stay: Payer: Self-pay

## 2019-08-20 DIAGNOSIS — F17210 Nicotine dependence, cigarettes, uncomplicated: Secondary | ICD-10-CM | POA: Diagnosis present

## 2019-08-20 DIAGNOSIS — N1831 Chronic kidney disease, stage 3a: Secondary | ICD-10-CM | POA: Diagnosis present

## 2019-08-20 DIAGNOSIS — Z79890 Hormone replacement therapy (postmenopausal): Secondary | ICD-10-CM | POA: Diagnosis not present

## 2019-08-20 DIAGNOSIS — G8929 Other chronic pain: Secondary | ICD-10-CM | POA: Diagnosis present

## 2019-08-20 DIAGNOSIS — L0390 Cellulitis, unspecified: Secondary | ICD-10-CM | POA: Diagnosis present

## 2019-08-20 DIAGNOSIS — E10621 Type 1 diabetes mellitus with foot ulcer: Secondary | ICD-10-CM | POA: Diagnosis present

## 2019-08-20 DIAGNOSIS — I129 Hypertensive chronic kidney disease with stage 1 through stage 4 chronic kidney disease, or unspecified chronic kidney disease: Secondary | ICD-10-CM | POA: Diagnosis present

## 2019-08-20 DIAGNOSIS — E1022 Type 1 diabetes mellitus with diabetic chronic kidney disease: Secondary | ICD-10-CM | POA: Diagnosis present

## 2019-08-20 DIAGNOSIS — L98498 Non-pressure chronic ulcer of skin of other sites with other specified severity: Secondary | ICD-10-CM | POA: Diagnosis present

## 2019-08-20 DIAGNOSIS — Z794 Long term (current) use of insulin: Secondary | ICD-10-CM | POA: Diagnosis not present

## 2019-08-20 DIAGNOSIS — R778 Other specified abnormalities of plasma proteins: Secondary | ICD-10-CM

## 2019-08-20 DIAGNOSIS — K219 Gastro-esophageal reflux disease without esophagitis: Secondary | ICD-10-CM | POA: Diagnosis present

## 2019-08-20 DIAGNOSIS — E039 Hypothyroidism, unspecified: Secondary | ICD-10-CM | POA: Diagnosis present

## 2019-08-20 DIAGNOSIS — Z20822 Contact with and (suspected) exposure to covid-19: Secondary | ICD-10-CM | POA: Diagnosis present

## 2019-08-20 DIAGNOSIS — F419 Anxiety disorder, unspecified: Secondary | ICD-10-CM | POA: Diagnosis present

## 2019-08-20 DIAGNOSIS — F1910 Other psychoactive substance abuse, uncomplicated: Secondary | ICD-10-CM | POA: Diagnosis not present

## 2019-08-20 DIAGNOSIS — R079 Chest pain, unspecified: Secondary | ICD-10-CM | POA: Diagnosis present

## 2019-08-20 DIAGNOSIS — Z833 Family history of diabetes mellitus: Secondary | ICD-10-CM | POA: Diagnosis not present

## 2019-08-20 DIAGNOSIS — Z79891 Long term (current) use of opiate analgesic: Secondary | ICD-10-CM | POA: Diagnosis not present

## 2019-08-20 DIAGNOSIS — L03115 Cellulitis of right lower limb: Secondary | ICD-10-CM

## 2019-08-20 DIAGNOSIS — N1832 Chronic kidney disease, stage 3b: Secondary | ICD-10-CM

## 2019-08-20 DIAGNOSIS — E1065 Type 1 diabetes mellitus with hyperglycemia: Secondary | ICD-10-CM

## 2019-08-20 DIAGNOSIS — F1110 Opioid abuse, uncomplicated: Secondary | ICD-10-CM | POA: Diagnosis present

## 2019-08-20 DIAGNOSIS — D631 Anemia in chronic kidney disease: Secondary | ICD-10-CM | POA: Diagnosis present

## 2019-08-20 DIAGNOSIS — Z7151 Drug abuse counseling and surveillance of drug abuser: Secondary | ICD-10-CM | POA: Diagnosis not present

## 2019-08-20 DIAGNOSIS — L97529 Non-pressure chronic ulcer of other part of left foot with unspecified severity: Secondary | ICD-10-CM | POA: Diagnosis present

## 2019-08-20 DIAGNOSIS — F1990 Other psychoactive substance use, unspecified, uncomplicated: Secondary | ICD-10-CM

## 2019-08-20 DIAGNOSIS — F1410 Cocaine abuse, uncomplicated: Secondary | ICD-10-CM | POA: Diagnosis present

## 2019-08-20 DIAGNOSIS — E10628 Type 1 diabetes mellitus with other skin complications: Secondary | ICD-10-CM | POA: Diagnosis present

## 2019-08-20 DIAGNOSIS — L03114 Cellulitis of left upper limb: Secondary | ICD-10-CM | POA: Diagnosis present

## 2019-08-20 LAB — CBG MONITORING, ED
Glucose-Capillary: 448 mg/dL — ABNORMAL HIGH (ref 70–99)
Glucose-Capillary: 502 mg/dL (ref 70–99)
Glucose-Capillary: 363 mg/dL — ABNORMAL HIGH (ref 70–99)
Glucose-Capillary: 523 mg/dL (ref 70–99)

## 2019-08-20 LAB — BASIC METABOLIC PANEL
Anion gap: 14 (ref 5–15)
BUN: 13 mg/dL (ref 6–20)
CO2: 24 mmol/L (ref 22–32)
Calcium: 9.1 mg/dL (ref 8.9–10.3)
Chloride: 99 mmol/L (ref 98–111)
Creatinine, Ser: 1.54 mg/dL — ABNORMAL HIGH (ref 0.61–1.24)
GFR calc Af Amer: >60 mL/min (ref >60)
GFR calc non Af Amer: 56 mL/min — ABNORMAL LOW (ref >60)
Glucose, Bld: 274 mg/dL — ABNORMAL HIGH (ref 70–99)
Potassium: 3.4 mmol/L — ABNORMAL LOW (ref 3.5–5.1)
Sodium: 137 mmol/L (ref 135–145)

## 2019-08-20 LAB — URINALYSIS, ROUTINE W REFLEX MICROSCOPIC
Bacteria, UA: NONE SEEN
Bilirubin Urine: NEGATIVE
Glucose, UA: >=500 mg/dL — AB
Ketones, ur: NEGATIVE mg/dL
Leukocytes,Ua: NEGATIVE
Nitrite: NEGATIVE
Protein, ur: >=300 mg/dL — AB
Specific Gravity, Urine: 1.026 (ref 1.005–1.030)
pH: 6 (ref 5.0–8.0)

## 2019-08-20 LAB — TROPONIN I (HIGH SENSITIVITY)
Troponin I (High Sensitivity): 138 ng/L (ref <18)
Troponin I (High Sensitivity): 144 ng/L (ref <18)
Troponin I (High Sensitivity): 77 ng/L — ABNORMAL HIGH (ref <18)
Troponin I (High Sensitivity): 91 ng/L — ABNORMAL HIGH (ref <18)

## 2019-08-20 LAB — GLUCOSE, CAPILLARY
Glucose-Capillary: 192 mg/dL — ABNORMAL HIGH (ref 70–99)
Glucose-Capillary: 85 mg/dL (ref 70–99)

## 2019-08-20 LAB — SARS CORONAVIRUS 2 BY RT PCR (HOSPITAL ORDER, PERFORMED IN ~~LOC~~ HOSPITAL LAB): SARS Coronavirus 2: NEGATIVE

## 2019-08-20 LAB — LACTIC ACID, PLASMA: Lactic Acid, Venous: 1.3 mmol/L (ref 0.5–1.9)

## 2019-08-20 MED ORDER — SODIUM CHLORIDE 0.9 % IV SOLN: INTRAVENOUS | Status: DC

## 2019-08-20 MED ORDER — INSULIN ASPART 100 UNIT/ML ~~LOC~~ SOLN
20.0000 [IU] | Freq: Once | SUBCUTANEOUS | Status: AC
  Administered 2019-08-20: 20 [IU] via SUBCUTANEOUS

## 2019-08-20 MED ORDER — SODIUM CHLORIDE 0.9% FLUSH: 3.0000 mL | Freq: Two times a day (BID) | INTRAVENOUS | Status: DC

## 2019-08-20 MED ORDER — INSULIN ASPART 100 UNIT/ML ~~LOC~~ SOLN
0.0000 [IU] | Freq: Three times a day (TID) | SUBCUTANEOUS | Status: DC
  Administered 2019-08-20: 15 [IU] via SUBCUTANEOUS
  Administered 2019-08-21: 8 [IU] via SUBCUTANEOUS
  Administered 2019-08-21: 11 [IU] via SUBCUTANEOUS
  Administered 2019-08-21: 5 [IU] via SUBCUTANEOUS

## 2019-08-20 MED ORDER — INSULIN ASPART 100 UNIT/ML ~~LOC~~ SOLN: 0.0000 [IU] | Freq: Every day | SUBCUTANEOUS | Status: DC

## 2019-08-20 MED ORDER — LORAZEPAM 2 MG/ML IJ SOLN: 2.0000 mg | Freq: Once | INTRAMUSCULAR | Status: DC

## 2019-08-20 MED ORDER — SODIUM CHLORIDE 0.9 % IV BOLUS (SEPSIS)
1000.0000 mL | Freq: Once | INTRAVENOUS | Status: AC
  Administered 2019-08-20: 1000 mL via INTRAVENOUS

## 2019-08-20 MED ORDER — POTASSIUM CHLORIDE CRYS ER 20 MEQ PO TBCR
20.0000 meq | EXTENDED_RELEASE_TABLET | Freq: Once | ORAL | Status: AC
  Administered 2019-08-20: 20 meq via ORAL
  Filled 2019-08-20: qty 1

## 2019-08-20 MED ORDER — SERTRALINE HCL 50 MG PO TABS
50.0000 mg | ORAL_TABLET | Freq: Every day | ORAL | Status: DC
  Administered 2019-08-21: 50 mg via ORAL
  Filled 2019-08-20: qty 1

## 2019-08-20 MED ORDER — LEVOTHYROXINE SODIUM 50 MCG PO TABS
50.0000 ug | ORAL_TABLET | Freq: Every day | ORAL | Status: DC
  Administered 2019-08-21: 50 ug via ORAL
  Filled 2019-08-20 (×2): qty 1

## 2019-08-20 MED ORDER — COLLAGENASE 250 UNIT/GM EX OINT
1.0000 "application " | TOPICAL_OINTMENT | Freq: Every day | CUTANEOUS | Status: DC
  Filled 2019-08-20: qty 30

## 2019-08-20 MED ORDER — ONDANSETRON HCL 4 MG PO TABS: 4.0000 mg | ORAL_TABLET | Freq: Four times a day (QID) | ORAL | Status: DC | PRN

## 2019-08-20 MED ORDER — INSULIN ASPART 100 UNIT/ML ~~LOC~~ SOLN
6.0000 [IU] | Freq: Three times a day (TID) | SUBCUTANEOUS | Status: DC
  Administered 2019-08-20 – 2019-08-21 (×2): 6 [IU] via SUBCUTANEOUS

## 2019-08-20 MED ORDER — ASPIRIN 81 MG PO CHEW
324.0000 mg | CHEWABLE_TABLET | Freq: Once | ORAL | Status: AC
  Administered 2019-08-20: 324 mg via ORAL
  Filled 2019-08-20: qty 4

## 2019-08-20 MED ORDER — VANCOMYCIN HCL 1250 MG/250ML IV SOLN
1250.0000 mg | Freq: Two times a day (BID) | INTRAVENOUS | Status: DC
  Administered 2019-08-20 – 2019-08-21 (×4): 1250 mg via INTRAVENOUS
  Filled 2019-08-20 (×6): qty 250

## 2019-08-20 MED ORDER — SODIUM CHLORIDE 0.9 % IV BOLUS: 500.0000 mL | Freq: Once | INTRAVENOUS | Status: DC

## 2019-08-20 MED ORDER — PANTOPRAZOLE SODIUM 40 MG PO TBEC
40.0000 mg | DELAYED_RELEASE_TABLET | Freq: Every day | ORAL | Status: DC
  Administered 2019-08-21: 40 mg via ORAL
  Filled 2019-08-20: qty 1

## 2019-08-20 MED ORDER — SODIUM CHLORIDE 0.9% FLUSH: 10.0000 mL | INTRAVENOUS | Status: DC | PRN

## 2019-08-20 MED ORDER — SODIUM CHLORIDE 0.9 % IV SOLN: INTRAVENOUS | Status: AC

## 2019-08-20 MED ORDER — INSULIN GLARGINE 100 UNIT/ML ~~LOC~~ SOLN
25.0000 [IU] | Freq: Two times a day (BID) | SUBCUTANEOUS | Status: DC
  Administered 2019-08-20: 25 [IU] via SUBCUTANEOUS
  Filled 2019-08-20 (×4): qty 0.25

## 2019-08-20 MED ORDER — CHLORHEXIDINE GLUCONATE CLOTH 2 % EX PADS: 6.0000 | MEDICATED_PAD | Freq: Every day | CUTANEOUS | Status: DC

## 2019-08-20 MED ORDER — SODIUM CHLORIDE 0.9% FLUSH
10.0000 mL | Freq: Two times a day (BID) | INTRAVENOUS | Status: DC
  Administered 2019-08-20 (×2): 10 mL

## 2019-08-20 MED ORDER — ATORVASTATIN CALCIUM 10 MG PO TABS
20.0000 mg | ORAL_TABLET | Freq: Every day | ORAL | Status: DC
  Administered 2019-08-20 – 2019-08-21 (×2): 20 mg via ORAL
  Filled 2019-08-20 (×2): qty 2

## 2019-08-20 MED ORDER — VANCOMYCIN HCL IN DEXTROSE 1-5 GM/200ML-% IV SOLN: 1000.0000 mg | Freq: Once | INTRAVENOUS | Status: DC

## 2019-08-20 MED ORDER — DULOXETINE HCL 30 MG PO CPEP
30.0000 mg | ORAL_CAPSULE | Freq: Every day | ORAL | Status: DC
  Administered 2019-08-21: 30 mg via ORAL
  Filled 2019-08-20: qty 1

## 2019-08-20 MED ORDER — LORAZEPAM 1 MG PO TABS
1.0000 mg | ORAL_TABLET | Freq: Four times a day (QID) | ORAL | Status: DC | PRN
  Administered 2019-08-20 – 2019-08-21 (×4): 1 mg via ORAL
  Filled 2019-08-20 (×4): qty 1

## 2019-08-20 MED ORDER — INSULIN GLARGINE 100 UNIT/ML ~~LOC~~ SOLN
25.0000 [IU] | Freq: Every day | SUBCUTANEOUS | Status: DC
  Filled 2019-08-20: qty 0.25

## 2019-08-20 MED ORDER — CARVEDILOL 12.5 MG PO TABS
12.5000 mg | ORAL_TABLET | Freq: Two times a day (BID) | ORAL | Status: DC
  Administered 2019-08-20 – 2019-08-21 (×3): 12.5 mg via ORAL
  Filled 2019-08-20 (×3): qty 1

## 2019-08-20 MED ORDER — ACETAMINOPHEN 650 MG RE SUPP: 650.0000 mg | Freq: Four times a day (QID) | RECTAL | Status: DC | PRN

## 2019-08-20 MED ORDER — ACETAMINOPHEN 325 MG PO TABS: 650.0000 mg | ORAL_TABLET | Freq: Four times a day (QID) | ORAL | Status: DC | PRN

## 2019-08-20 MED ORDER — GABAPENTIN 400 MG PO CAPS
400.0000 mg | ORAL_CAPSULE | Freq: Three times a day (TID) | ORAL | Status: DC
  Administered 2019-08-20 – 2019-08-21 (×4): 400 mg via ORAL
  Filled 2019-08-20 (×4): qty 1

## 2019-08-20 MED ORDER — MELATONIN 5 MG PO TABS
5.0000 mg | ORAL_TABLET | Freq: Every evening | ORAL | Status: DC | PRN
  Filled 2019-08-20: qty 1

## 2019-08-20 MED ORDER — ONDANSETRON HCL 4 MG/2ML IJ SOLN: 4.0000 mg | Freq: Four times a day (QID) | INTRAMUSCULAR | Status: DC | PRN

## 2019-08-20 MED ORDER — KETOROLAC TROMETHAMINE 15 MG/ML IJ SOLN
15.0000 mg | Freq: Four times a day (QID) | INTRAMUSCULAR | Status: DC | PRN
  Administered 2019-08-21: 15 mg via INTRAVENOUS
  Filled 2019-08-20 (×2): qty 1

## 2019-08-20 MED ORDER — PIPERACILLIN-TAZOBACTAM 3.375 G IVPB 30 MIN
3.3750 g | Freq: Once | INTRAVENOUS | Status: AC
  Administered 2019-08-20: 3.375 g via INTRAVENOUS
  Filled 2019-08-20: qty 50

## 2019-08-20 MED ORDER — TRAZODONE HCL 50 MG PO TABS
50.0000 mg | ORAL_TABLET | Freq: Every day | ORAL | Status: DC
  Administered 2019-08-20: 50 mg via ORAL
  Filled 2019-08-20: qty 1

## 2019-08-20 NOTE — ED Notes (Signed)
Pt refusing rectal temperature

## 2019-08-20 NOTE — ED Notes (Signed)
lunch tray ordered

## 2019-08-20 NOTE — Progress Notes (Addendum)
[facility] PROGRESS NOTE    Progress Note  Luke [name]  ZOX:[id number] DOB: 12/30/1980 DOA: 08/19/2019 PCP: Gildardo Pounds, NP     Brief Narrative:   Luke [name] is an 39 y.o. male  with medical history significant for IV heroin abuse, insulin-dependent diabetes mellitus, hypothyroidism, chronic kidney disease stage III, and history of endocarditis, now presenting to the emergency department for evaluation of chest pain and shortness of breath.  The patient reports developing pleuritic chest pain and shortness of breath approximately 1 week ago, has not been coughing much, has not noted any fevers or chills, and denies lower extremity swelling or tenderness.  He has a history of left great toe amputation and reports a nonhealing ulceration at the dorsal aspect of his second toe on the left as well as nonhealing wounds involving the forearms bilaterall  Assessment/Plan:   Chest pain: CXR no acute finding, EKGno signs of ischemia. No events on telemetry. CTA pending.  Bilateral Left arm Cellulitis with deep ulcer: Purulent drainage. Blood cultures pending. X-ray no osteomyelitis. Cont IV antibiotics, consult Wound care.  Uncontrolled insulin dependent type 1 diabetes mellitus ([place]): A1c 10.9 cont SSI and long acting insulin will need tight BG controlled.  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    CKD (chronic kidney disease) stage 3, GFR 30-59 ml/min Cr seems to be at baseline, is ok to exchange midline to PICC line. Hypothyroidism Cont synthroid.    DVT prophylaxis: lovenox Family Communication:none Status is: Inpatient  Remains inpatient appropriate because:IV treatments appropriate due to intensity of illness or inability to take PO   Dispo: The patient is from: Home              Anticipated d/c is to: Home              Anticipated d/c date is: > 3 days              Patient currently is not medically stable to d/c.         Code  Status:  Code Status History    Date Active Date Inactive Code Status Order ID Comments User Context   05/20/2019 0125 05/29/2019 1944 Full Code [id number]  Chauncey Mann, MD ED   05/09/2019 0056 05/16/2019 1931 Full Code [id number]  Etta Quill, DO ED   03/19/2019 0228 03/19/2019 1512 Full Code [id number]  Vianne Bulls, MD ED   11/21/2017 2011 12/12/2017 2021 Full Code [id number]  Patrecia Pour, MD Inpatient   07/21/2017 2047 07/25/2017 1744 Full Code [id number]  Ivor Costa, MD ED   12/17/2016 0119 12/19/2016 1436 Full Code [id number]  Jani Gravel, MD ED   12/03/2014 0143 12/05/2014 1824 Full Code [id number]  Lavina Hamman, MD ED   11/23/2013 1425 11/25/2013 1530 Full Code [id number]  Melvenia Needles, NP ED   10/14/2012 0223 10/23/2012 2205 Full Code [id number]  Berle Mull, MD Inpatient   10/11/2012 0434 10/13/2012 1430 Full Code [id number]  Orvan Falconer, MD Inpatient   Advance Care Planning Activity        IV Access:    Peripheral IV   Procedures and diagnostic studies:   DG Chest 2 View  Result Date: 08/19/2019 CLINICAL DATA:  Chest pain and shortness of breath EXAM: CHEST - 2 VIEW COMPARISON:  05/22/2019 FINDINGS: Cardiac shadow is within limits. Central line is been removed in the interval. The lungs are well aerated with minimal scarring in the left base. No effusion  or infiltrate is seen. No bony abnormality is noted. IMPRESSION: Chronic changes without acute abnormality. Electronically Signed   By: Inez Catalina M.D.   On: 08/19/2019 23:20   DG Forearm Left  Result Date: 08/20/2019 CLINICAL DATA:  Cellulitis, IV drug use EXAM: LEFT FOREARM - 2 VIEW COMPARISON:  None. FINDINGS: There is no evidence of fracture or other focal bone lesions. There is diffuse subcutaneous edema seen along the volar and dorsal surface of the forearm. There is possible subcutaneous emphysema seen along the posterior olecranon with a focal area of ulceration. A IMPRESSION: No acute osseous abnormality. Diffuse subcutaneous  edema and possible area of ulceration seen on the posterior forearm. Electronically Signed   By: Prudencio Pair M.D.   On: 08/20/2019 05:21     Medical Consultants:    None.  Anti-Infectives:   vanc  Subjective:    [name] Luke complaining of SOB and Chest pain  Objective:    Vitals:   08/20/19 0515 08/20/19 0630 08/20/19 0645 08/20/19 0700  BP: 126/84 126/87 117/67 116/73  Pulse:  (!) 102 (!) 103 96  Resp: (!) 24 18 (!) 24 14  Temp:      TempSrc:      SpO2:  94% 95% 94%  Weight:      Height:       SpO2: 94 %  No intake or output data in the 24 hours ending 08/20/19 1128 Filed Weights   08/19/19 2237  Weight: 90 kg    Exam: General exam: In no acute distress. Respiratory system: Good air movement and clear to auscultation. Cardiovascular system: S1 & S2 heard, RRR. No JVD. Gastrointestinal system: Abdomen is nondistended, soft and nontender.  Central nervous system: Alert and oriented. No focal neurological deficits. Extremities: No pedal edema. Skin: bilateral ulcer with necrosis and and erythema Psychiatry: Judgement and insight appear normal. Mood & affect appropriate.   Data Reviewed:    Labs: Basic Metabolic Panel: Recent Labs  Lab 08/19/19 2309  NA 137  K 3.4*  CL 99  CO2 24  GLUCOSE 274*  BUN 13  CREATININE 1.54*  CALCIUM 9.1   GFR Estimated Creatinine Clearance: 71.4 mL/min (A) (by C-G formula based on SCr of 1.54 mg/dL (H)). Liver Function Tests: No results for input(s): AST, ALT, ALKPHOS, BILITOT, PROT, ALBUMIN in the last 168 hours. No results for input(s): LIPASE, AMYLASE in the last 168 hours. No results for input(s): AMMONIA in the last 168 hours. Coagulation profile Recent Labs  Lab 08/19/19 2309  INR 1.0   COVID-19 Labs  No results for input(s): DDIMER, FERRITIN, LDH, CRP in the last 72 hours.  Lab Results  Component Value Date   SARSCOV2NAA NEGATIVE 08/20/2019   [place] NEGATIVE 05/09/2019   [place] NEGATIVE  03/19/2019   SARSCOV2NAA NOT DETECTED 09/05/2018    CBC: Recent Labs  Lab 08/19/19 2309  WBC 14.1*  HGB 10.0*  HCT 32.0*  MCV 82.3  PLT 574*   Cardiac Enzymes: No results for input(s): CKTOTAL, CKMB, CKMBINDEX, TROPONINI in the last 168 hours. BNP (last 3 results) No results for input(s): PROBNP in the last 8760 hours. CBG: No results for input(s): GLUCAP in the last 168 hours. D-Dimer: No results for input(s): DDIMER in the last 72 hours. Hgb A1c: No results for input(s): HGBA1C in the last 72 hours. Lipid Profile: No results for input(s): CHOL, HDL, LDLCALC, TRIG, CHOLHDL, LDLDIRECT in the last 72 hours. Thyroid function studies: No results for input(s): TSH, T4TOTAL, T3FREE, THYROIDAB  in the last 72 hours.  Invalid input(s): FREET3 Anemia work up: No results for input(s): VITAMINB12, FOLATE, FERRITIN, TIBC, IRON, RETICCTPCT in the last 72 hours. Sepsis Labs: Recent Labs  Lab 08/19/19 2309  WBC 14.1*  LATICACIDVEN 1.3   Microbiology Recent Results (from the past 240 hour(s))  Culture, blood (Routine x 2)     Status: None (Preliminary result)   Collection Time: 08/19/19 10:55 PM   Specimen: BLOOD  Result Value Ref Range Status   Specimen Description BLOOD RIGHT ARM  Final   Special Requests   Final    BOTTLES DRAWN AEROBIC AND ANAEROBIC Blood Culture adequate volume   Culture   Final    NO GROWTH < 12 HOURS Performed at [facility], [address] [place], [place] [address]    Report Status PENDING  Incomplete  Culture, blood (Routine x 2)     Status: None (Preliminary result)   Collection Time: 08/19/19 11:09 PM   Specimen: BLOOD  Result Value Ref Range Status   Specimen Description BLOOD LEFT ARM  Final   Special Requests   Final    BOTTLES DRAWN AEROBIC ONLY Blood Culture adequate volume   Culture   Final    NO GROWTH < 12 HOURS Performed at [facility], [name] [address] [place], [place] [address]    Report Status PENDING  Incomplete    SARS Coronavirus 2 by RT PCR (hospital order, performed in [facility] hospital lab) Nasopharyngeal Nasopharyngeal Swab     Status: None   Collection Time: 08/20/19  9:10 AM   Specimen: Nasopharyngeal Swab  Result Value Ref Range Status   SARS Coronavirus 2 NEGATIVE NEGATIVE Final    Comment: (NOTE) SARS-CoV-2 target nucleic acids are NOT DETECTED. The SARS-CoV-2 RNA is generally detectable in upper and lower respiratory specimens during the acute phase of infection. The lowest concentration of SARS-CoV-2 viral copies this assay can detect is 250 copies / mL. A negative result does not preclude SARS-CoV-2 infection and should not be used as the sole basis for treatment or other patient management decisions.  A negative result may occur with improper specimen collection / handling, submission of specimen other than nasopharyngeal swab, presence of viral mutation(s) within the areas targeted by this assay, and inadequate number of viral copies (<250 copies / mL). A negative result must be combined with clinical observations, patient history, and epidemiological information. Fact Sheet for Patients:   StrictlyIdeas.no Fact Sheet for Healthcare Providers: BankingDealers.co.za This test is not yet approved or cleared  by the [place] FDA and has been authorized for detection and/or diagnosis of SARS-CoV-2 by FDA under an Emergency Use Authorization (EUA).  This EUA will remain in effect (meaning this test can be used) for the duration of the COVID-19 declaration under Section 564(b)(1) of the Act, 21 U.S.C. section 360bbb-3(b)(1), unless the authorization is terminated or revoked sooner. Performed at [facility], [place] [address], [place], [place] [address]      Medications:    Chlorhexidine Gluconate Cloth  6 each Topical Daily   sodium chloride flush  10-40 mL Intracatheter Q12H   Continuous Infusions:  sodium chloride      vancomycin 1,250 mg (08/20/19 0908)      LOS: 0 days   Charlynne Cousins  [facility]  08/20/2019, 11:28 AM

## 2019-08-20 NOTE — ED Notes (Signed)
Breakfast ordered 

## 2019-08-20 NOTE — ED Notes (Signed)
Pt was stating around 89%. Put on 2L and now WDl

## 2019-08-20 NOTE — H&P (Signed)
History and Physical    Luke [name] HCW:[id number] DOB: 22-Nov-1980 DOA: 08/19/2019  PCP: Gildardo Pounds, NP   Patient coming from: Home   Chief Complaint: Chest pain, SOB, drainage from left arm   HPI: Luke [name] is a 39 y.o. male with medical history significant for IV heroin abuse, insulin-dependent diabetes mellitus, hypothyroidism, chronic kidney disease stage III, and history of endocarditis, now presenting to the emergency department for evaluation of chest pain and shortness of breath.  The patient reports developing pleuritic chest pain and shortness of breath approximately 1 week ago, has not been coughing much, has not noted any fevers or chills, and denies lower extremity swelling or tenderness.  He has a history of left great toe amputation and reports a nonhealing ulceration at the dorsal aspect of his second toe on the left as well as nonhealing wounds involving the forearms bilaterally.  He reports recent increased pain from the left forearm with recent redness and purulent drainage.  He continues to inject heroin but reports wanting to quit.  ED Course: Upon arrival to the ED, patient is found to be afebrile, saturating low 90s on room air, tachycardic in the 110s, and with stable blood pressure.  EKG features sinus tachycardia with rate 117.  Chest x-ray with chronic changes but no acute findings.  Chemistry panel with potassium 3.4, glucose 274, and creatinine 1.54, similar to priors.  CBC with leukocytosis, stable anemia, and thrombocytosis.  Lactic acid is reassuringly normal.  Troponins elevated to 138, then 144 when repeated.  Blood cultures were collected in the ED and the patient was given 324 mg of aspirin, a liter of normal saline, vancomycin, and Zosyn.  Review of Systems:  All other systems reviewed and apart from HPI, are negative.  Past Medical History:  Diagnosis Date   Anemia    Anxiety  Dx 2008   Chronic kidney disease    Diabetes mellitus without  complication ([place]) Dx 1761   DKA (diabetic ketoacidoses) ([place]) 07/21/2017   GERD (gastroesophageal reflux disease) Dx 2008   Headache(784.0)    Hypertension    Pneumonia    Seizures ([place]) 2011    x 2 in lifetime. on Dilantin for a while.    Substance abuse ([place]) 2013    heroin use, multiple relapses    Past Surgical History:  Procedure Laterality Date   AMPUTATION Left 09/05/2018   Procedure: LEFT GREAT TOE AMPUTATION;  Surgeon: [name], MD;  Location: [place];  Service: Orthopedics;  Laterality: Left;   I & D EXTREMITY Left 10/11/2012   Procedure: IRRIGATION AND DEBRIDEMENT ABSCESS FOREARM;  Surgeon: Linna Hoff, MD;  Location: [place];  Service: Orthopedics;  Laterality: Left;   I & D EXTREMITY Left 10/12/2012   Procedure: IRRIGATION AND DEBRIDEMENT FOREARM;  Surgeon: Linna Hoff, MD;  Location: [place];  Service: Orthopedics;  Laterality: Left;   I & D EXTREMITY Left 10/14/2012   Procedure: incision and drainage left forearm;  Surgeon: Linna Hoff, MD;  Location: [place];  Service: Orthopedics;  Laterality: Left;   I & D EXTREMITY Left 10/16/2012   Procedure: IRRIGATION AND DEBRIDEMENT LEFT FOREARM;  Surgeon: Linna Hoff, MD;  Location: [place];  Service: Orthopedics;  Laterality: Left;   I & D EXTREMITY Left 10/20/2012   Procedure: INCISION AND DRAINAGE AND DEBRIDEMENT LEFT  FOREARM;  Surgeon: Linna Hoff, MD;  Location: [place];  Service: Orthopedics;  Laterality: Left;   I &  D EXTREMITY Right 05/22/2019   Procedure: IRRIGATION AND DEBRIDEMENT RIGHT ARM;  Surgeon: [name], MD;  Location: [place];  Service: Orthopedics;  Laterality: Right;   INCISION AND DRAINAGE OF WOUND Bilateral 11/29/2017   Procedure: DEBRIDEMENT BILATERAL FEET, DEBRIDEMENT LEFT ANKLE, AND APPLY WOUND VAC;  Surgeon: [name], MD;  Location: [place];  Service: Orthopedics;  Laterality: Bilateral;   IRRIGATION AND DEBRIDEMENT ABSCESS     Hx: of left arm abscess related to drug use     TEE WITHOUT CARDIOVERSION N/A 11/28/2017   Procedure: TRANSESOPHAGEAL ECHOCARDIOGRAM (TEE);  Surgeon: Sanda Klein, MD;  Location: [place];  Service: Cardiovascular;  Laterality: N/A;   TEE WITHOUT CARDIOVERSION N/A 05/12/2019   Procedure: TRANSESOPHAGEAL ECHOCARDIOGRAM (TEE);  Surgeon: Skeet Latch, MD;  Location: [facility];  Service: Cardiovascular;  Laterality: N/A;     reports that he has been smoking cigarettes. He has been smoking about 1.00 pack per day. He has never used smokeless tobacco. He reports previous drug use. Drugs: Cocaine, IV, and Heroin. He reports that he does not drink alcohol.  No Known Allergies  Family History  Problem Relation Age of Onset   Diabetes Father    Heart disease Father    Mental illness Sister    Cancer Neg Hx      Prior to Admission medications   Medication Sig Start Date End Date Taking? Authorizing Provider  amLODipine (NORVASC) 5 MG tablet Take 1 tablet (5 mg total) by mouth daily. 04/20/19  Yes Gildardo Pounds, NP  atorvastatin (LIPITOR) 20 MG tablet Take 1 tablet (20 mg total) by mouth daily. 04/20/19  Yes Gildardo Pounds, NP  carvedilol (COREG) 12.5 MG tablet Take 1 tablet (12.5 mg total) by mouth 2 (two) times daily with a meal. 05/29/19  Yes Nita Sells, MD  DULoxetine (CYMBALTA) 30 MG capsule Take 1 capsule (30 mg total) by mouth daily. 06/10/19 09/08/19 Yes Gildardo Pounds, NP  ferrous sulfate 325 (65 FE) MG tablet Take 1 tablet (325 mg total) by mouth daily with breakfast. 05/29/19  Yes Nita Sells, MD  gabapentin (NEURONTIN) 400 MG capsule Take 1 capsule (400 mg total) by mouth 3 (three) times daily. 04/20/19 08/20/19 Yes Gildardo Pounds, NP  insulin glargine (LANTUS SOLOSTAR) 100 UNIT/ML Solostar Pen INJECT 30 UNITS INTO THE SKIN 2 (TWO) TIMES DAILY. Patient taking differently: Inject 40 Units into the skin daily.  06/25/19  Yes McClung, Angela M, PA-C  insulin lispro (HUMALOG KWIKPEN) 100 UNIT/ML KwikPen  Inject 0.04-0.2 mLs (4-20 Units total) into the skin 3 (three) times daily. Per sliding scale Patient taking differently: Inject 5-15 Units into the skin 3 (three) times daily. Per sliding scale 04/20/19  Yes Gildardo Pounds, NP  levothyroxine (SYNTHROID) 50 MCG tablet Take 1 tablet (50 mcg total) by mouth daily. 06/24/19  Yes Gildardo Pounds, NP  Melatonin 5 MG CHEW Chew 5 mg by mouth at bedtime as needed (sleep).   Yes [provider]  omeprazole (PRILOSEC) 20 MG capsule Take 1 capsule (20 mg total) by mouth daily. 04/20/19 08/20/19 Yes Gildardo Pounds, NP  sertraline (ZOLOFT) 50 MG tablet TAKE 1 TABLET (50 MG TOTAL) BY MOUTH DAILY. 08/07/19  Yes Charlott Rakes, MD  traZODone (DESYREL) 50 MG tablet Take 1 tablet (50 mg total) by mouth at bedtime. 06/10/19  Yes Gildardo Pounds, NP  acetaminophen (TYLENOL) 325 MG tablet Take 2 tablets (650 mg total) by mouth every 6 (six) hours as needed for  mild pain (or Fever >/= 101). 12/19/16   Debbe Odea, MD  Blood Glucose Monitoring Suppl (TRUE METRIX METER) DEVI 1 kit by Does not apply route as directed. Use as directed 06/07/15   Boykin Nearing, MD  doxycycline (VIBRA-TABS) 100 MG tablet Take 1 tablet (100 mg total) by mouth 2 (two) times daily. Patient not taking: Reported on 08/20/2019 06/24/19   Suzan Slick, NP  Insulin Syringe-Needle U-100 (TRUEPLUS INSULIN SYRINGE) 31G X 5/16" 0.5 ML MISC USE AS DIRECTED. 04/20/19   Gildardo Pounds, NP  polyethylene glycol (MIRALAX / GLYCOLAX) 17 g packet Take 17 g by mouth daily as needed for mild constipation. Patient not taking: Reported on 08/20/2019 05/29/19   Nita Sells, MD  TRUEPLUS LANCETS 28G MISC Use as directed 01/28/17   Alfonse Spruce, FNP    Physical Exam: Vitals:   08/20/19 0336 08/20/19 0345 08/20/19 0445 08/20/19 0454  BP:   122/74 122/80  Pulse:  100 (!) 101 (!) 103  Resp:  (!) 9 16 (!) 25  Temp: 98.6 F (37 C)   98.8 F (37.1 C)  TempSrc: Oral   Oral  SpO2:  97% 90% 93%    Weight:      Height:        Constitutional: NAD, calm  Eyes: PERTLA, lids and conjunctivae normal ENMT: Mucous membranes are moist. Posterior pharynx clear of any exudate or lesions.   Neck: normal, supple, no masses, no thyromegaly Respiratory:  Speaking full sentences. No accessory muscle use.  Cardiovascular: Rate ~120 and regular. No extremity edema.  Abdomen: No distension, no tenderness, soft. Bowel sounds active.  Musculoskeletal: no clubbing / cyanosis. Status-post left great toe amputation.   Skin: Ulceration to dorsal aspect 2nd toe on left. Ulcerations involving bilateral forearms with purulent drainage and surrounding erythema on left.   Neurologic: CN 2-12 grossly intact. Sensation intact. Moving all extremities.  Psychiatric: Alert and oriented to person, place, and situation. Pleasant and cooperative.    Labs and Imaging on Admission: I have personally reviewed following labs and imaging studies  CBC: Recent Labs  Lab 08/19/19 2309  WBC 14.1*  HGB 10.0*  HCT 32.0*  MCV 82.3  PLT 277*   Basic Metabolic Panel: Recent Labs  Lab 08/19/19 2309  NA 137  K 3.4*  CL 99  CO2 24  GLUCOSE 274*  BUN 13  CREATININE 1.54*  CALCIUM 9.1   GFR: Estimated Creatinine Clearance: 71.4 mL/min (A) (by C-G formula based on SCr of 1.54 mg/dL (H)). Liver Function Tests: No results for input(s): AST, ALT, ALKPHOS, BILITOT, PROT, ALBUMIN in the last 168 hours. No results for input(s): LIPASE, AMYLASE in the last 168 hours. No results for input(s): AMMONIA in the last 168 hours. Coagulation Profile: Recent Labs  Lab 08/19/19 2309  INR 1.0   Cardiac Enzymes: No results for input(s): CKTOTAL, CKMB, CKMBINDEX, TROPONINI in the last 168 hours. BNP (last 3 results) No results for input(s): PROBNP in the last 8760 hours. HbA1C: No results for input(s): HGBA1C in the last 72 hours. CBG: No results for input(s): GLUCAP in the last 168 hours. Lipid Profile: No results for  input(s): CHOL, HDL, LDLCALC, TRIG, CHOLHDL, LDLDIRECT in the last 72 hours. Thyroid Function Tests: No results for input(s): TSH, T4TOTAL, FREET4, T3FREE, THYROIDAB in the last 72 hours. Anemia Panel: No results for input(s): VITAMINB12, FOLATE, FERRITIN, TIBC, IRON, RETICCTPCT in the last 72 hours. Urine analysis:    Component Value Date/Time   COLORURINE YELLOW 05/22/2019  2219   APPEARANCEUR CLOUDY (A) 05/22/2019 2219   APPEARANCEUR Clear 04/11/2014 0138   LABSPEC 1.029 05/22/2019 2219   LABSPEC 1.026 04/11/2014 0138   PHURINE 5.0 05/22/2019 2219   GLUCOSEU >=500 (A) 05/22/2019 2219   GLUCOSEU >=500 04/11/2014 0138   HGBUR SMALL (A) 05/22/2019 2219   BILIRUBINUR NEGATIVE 05/22/2019 2219   BILIRUBINUR negative 03/24/2018 1032   BILIRUBINUR neg 02/18/2017 0946   BILIRUBINUR Negative 04/11/2014 0138   KETONESUR NEGATIVE 05/22/2019 2219   PROTEINUR 100 (A) 05/22/2019 2219   UROBILINOGEN 0.2 03/24/2018 1032   UROBILINOGEN 0.2 12/10/2013 1221   NITRITE NEGATIVE 05/22/2019 2219   LEUKOCYTESUR NEGATIVE 05/22/2019 2219   LEUKOCYTESUR Negative 04/11/2014 0138   Sepsis Labs: '@LABRCNTIP'$ (procalcitonin:4,lacticidven:4) )No results found for this or any previous visit (from the past 240 hour(s)).   Radiological Exams on Admission: DG Chest 2 View  Result Date: 08/19/2019 CLINICAL DATA:  Chest pain and shortness of breath EXAM: CHEST - 2 VIEW COMPARISON:  05/22/2019 FINDINGS: Cardiac shadow is within limits. Central line is been removed in the interval. The lungs are well aerated with minimal scarring in the left base. No effusion or infiltrate is seen. No bony abnormality is noted. IMPRESSION: Chronic changes without acute abnormality. Electronically Signed   By: Inez Catalina M.D.   On: 08/19/2019 23:20    EKG: Independently reviewed. Sinus tachycardia, rate 117.   Assessment/Plan   1. Chest pain; elevated troponin  - Presents with one week of pleuritic chest pain and SOB, found to  have leukocytosis and tachycardia with stable BP, no acute findings on CXR, no acute ST-T abnormalities on EKG, and elevated troponin to 138, then 144  - Check CTA chest, trend troponin, continue cardiac monitoring, continue statin and beta-blocker   2. Left arm cellulitis  - Presents with pleuritic chest pain, SOB, and drainage from left forearm  - There are chronic-appearing wounds to bilateral UEs with erythema and purulent drainage from the left  - There is leukocytosis and tachycardia on admission with stable BP and normal lactate  - Blood cultures were collected in ED and empiric antibiotics were started  - Check plain films for underlying osteo, continue empiric antibiotics with vancomycin, follow cultures and clinical course    3. Insulin-dependent DM  - A1c was 10.9% in March 2021  - Continue CBG checks and insulin    4. CKD IIIa  - SCr is 1.54 on admission, similar to priors  - Renally-dose medications, monitor    5. Hypothyroidism  - Continue Synthroid   6. IVDA  - Patient reports ongoing heroin use, wants to quit  - Does not appear to be withdrawing on admission  - TOC consulted for possible resources     DVT prophylaxis: SCDs  Code Status: Full  Family Communication: Discussed with patient  Disposition Plan:  Patient is from: Home  Anticipated d/c is to: Home  Anticipated d/c date is: 08/23/19 Patient currently: Pending CTA chest, cultures  Consults called: None  Admission status: Inpatient     Vianne Bulls, MD [facility] Pager: See [email or web address]  If 7AM-7PM, please contact the daytime attending [email or web address]  08/20/2019, 5:04 AM

## 2019-08-20 NOTE — Progress Notes (Addendum)
Patient c/o SOB/CP as describe upon admission. Patient's O2 increased to 5L with sats 91%. Upon assessment, lungs wet bilateral bases and left upper. O2 increased to 6L. Patient sats 94-95%. Notified on call [place], NP. CXR ordered stat. Will continue to monitor

## 2019-08-20 NOTE — Consult Note (Signed)
WOC Nurse Consult Note: Patient receiving care in [facility] ED 40. Reason for Consult: bilateral forearms Wound type: BUE full thickness with eschar; full thickness wound to joint of left second toe Pressure Injury POA: Yes/No/NA Measurement: Left second toe approximately 1 cm x 0.8 cm, dry pink.  Right dorsal forearm has 3 distinct wounds with intact tissue islands separating them.  All of these have eschar; the two larger wounds also have thick yellow slough in the wound beds.  The left dorsal forearm has one large wound that is 100% eschar and yellow.  The arm wounds are from injecting IV drugs. Wound bed: Drainage (amount, consistency, odor)  Periwound:  The arm wounds are surrounded by old track marks, hypopigmentation and scarring. Dressing procedure/placement/frequency:  Apply Santyl to BUE wounds and joint of left second toe in a nickel thick layer. Cover with a saline moistened gauze, then dry gauze or ABD pad.  Secure arm dressings with kerlex.  Change daily. Also today's LFA x ray results indicate:  "IMPRESSION: No acute osseous abnormality. Diffuse subcutaneous edema and possible area of ulceration seen on the posterior forearm."  Please consider consult surgical services for potential surgical intervention. Monitor the wound area(s) for worsening of condition such as: Signs/symptoms of infection,  Increase in size,  Development of or worsening of odor, Development of pain, or increased pain at the affected locations.  Notify the medical team if any of these develop.  Thank you for the consult.  Discussed plan of care with the patient and bedside nurse.  [facility] nurse will not follow at this time.  Please re-consult the [place] team if needed.  Val Riles, RN, MSN, CWOCN, CNS-BC, pager [phone]

## 2019-08-20 NOTE — ED Provider Notes (Signed)
TIME SEEN: 3:35 AM  CHIEF COMPLAINT: Chest pain, shortness of breath  HPI: Patient is a 39 year old male with history of hypertension, diabetes, tobacco use, IV heroin and cocaine abuse, previous history of endocarditis in March 2021 who presents to the emergency department with diffuse anterior chest tightness without radiation, shortness of breath for the past week.  Symptoms worse with exertion.  He denies fevers, chills, cough, nausea, vomiting or diarrhea.  States he is continuing to use heroin regularly and cocaine intermittently.  Has multiple wounds to his bilateral upper extremities that have been present for several weeks as well as a wound to the left second toe.  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  ROS: See HPI Constitutional: no fever  Eyes: no drainage  ENT: no runny nose   Cardiovascular:   chest pain  Resp:  SOB  GI: no vomiting GU: no dysuria Integumentary: no rash  Allergy: no hives  Musculoskeletal: no leg swelling  Neurological: no slurred speech ROS otherwise negative  PAST MEDICAL HISTORY/PAST SURGICAL HISTORY:  Past Medical History:  Diagnosis Date  . Anemia   . Anxiety  Dx 2008  . Chronic kidney disease   . Diabetes mellitus without complication ([place]) Dx 3419  . DKA (diabetic ketoacidoses) ([place]) 07/21/2017  . GERD (gastroesophageal reflux disease) Dx 2008  . Headache(784.0)   . Hypertension   . Pneumonia   . Seizures ([place]) 2011    x 2 in lifetime. on Dilantin for a while.   . Substance abuse ([place]) 2013    heroin use, multiple relapses    MEDICATIONS:  Prior to Admission medications   Medication Sig Start Date End Date Taking? Authorizing Provider  amLODipine (NORVASC) 5 MG tablet Take 1 tablet (5 mg total) by mouth daily. 04/20/19  Yes Gildardo Pounds, NP  atorvastatin (LIPITOR) 20 MG tablet Take 1 tablet (20 mg total) by mouth daily. 04/20/19  Yes Gildardo Pounds, NP  carvedilol (COREG) 12.5 MG tablet Take 1 tablet  (12.5 mg total) by mouth 2 (two) times daily with a meal. 05/29/19  Yes Nita Sells, MD  DULoxetine (CYMBALTA) 30 MG capsule Take 1 capsule (30 mg total) by mouth daily. 06/10/19 09/08/19 Yes Gildardo Pounds, NP  ferrous sulfate 325 (65 FE) MG tablet Take 1 tablet (325 mg total) by mouth daily with breakfast. 05/29/19  Yes Nita Sells, MD  gabapentin (NEURONTIN) 400 MG capsule Take 1 capsule (400 mg total) by mouth 3 (three) times daily. 04/20/19 08/20/19 Yes Gildardo Pounds, NP  insulin glargine (LANTUS SOLOSTAR) 100 UNIT/ML Solostar Pen INJECT 30 UNITS INTO THE SKIN 2 (TWO) TIMES DAILY. Patient taking differently: Inject 40 Units into the skin daily.  06/25/19  Yes McClung, Angela M, PA-C  insulin lispro (HUMALOG KWIKPEN) 100 UNIT/ML KwikPen Inject 0.04-0.2 mLs (4-20 Units total) into the skin 3 (three) times daily. Per sliding scale Patient taking differently: Inject 5-15 Units into the skin 3 (three) times daily. Per sliding scale 04/20/19  Yes Gildardo Pounds, NP  levothyroxine (SYNTHROID) 50 MCG tablet Take 1 tablet (50 mcg total) by mouth daily. 06/24/19  Yes Gildardo Pounds, NP  Melatonin 5 MG CHEW Chew 5 mg by mouth at bedtime as needed (sleep).   Yes [provider]  omeprazole (PRILOSEC) 20 MG capsule Take 1 capsule (20 mg total) by mouth daily. 04/20/19 08/20/19 Yes Gildardo Pounds, NP  sertraline (ZOLOFT) 50 MG tablet TAKE 1 TABLET (50 MG TOTAL) BY  MOUTH DAILY. 08/07/19  Yes Charlott Rakes, MD  traZODone (DESYREL) 50 MG tablet Take 1 tablet (50 mg total) by mouth at bedtime. 06/10/19  Yes Gildardo Pounds, NP  acetaminophen (TYLENOL) 325 MG tablet Take 2 tablets (650 mg total) by mouth every 6 (six) hours as needed for mild pain (or Fever >/= 101). 12/19/16   Debbe Odea, MD  Blood Glucose Monitoring Suppl (TRUE METRIX METER) DEVI 1 kit by Does not apply route as directed. Use as directed 06/07/15   Boykin Nearing, MD  doxycycline (VIBRA-TABS) 100 MG tablet Take 1 tablet  (100 mg total) by mouth 2 (two) times daily. Patient not taking: Reported on 08/20/2019 06/24/19   Suzan Slick, NP  Insulin Syringe-Needle U-100 (TRUEPLUS INSULIN SYRINGE) 31G X 5/16" 0.5 ML MISC USE AS DIRECTED. 04/20/19   Gildardo Pounds, NP  polyethylene glycol (MIRALAX / GLYCOLAX) 17 g packet Take 17 g by mouth daily as needed for mild constipation. Patient not taking: Reported on 08/20/2019 05/29/19   Nita Sells, MD  TRUEPLUS LANCETS 28G MISC Use as directed 01/28/17   Alfonse Spruce, FNP    ALLERGIES:  No Known Allergies  SOCIAL HISTORY:  Social History   Tobacco Use  . Smoking status: Current Every Day Smoker    Packs/day: 1.00    Types: Cigarettes  . Smokeless tobacco: Never Used  Substance Use Topics  . Alcohol use: No    Alcohol/week: 0.0 standard drinks    Comment: occasional    FAMILY HISTORY: Family History  Problem Relation Age of Onset  . Diabetes Father   . Heart disease Father   . Mental illness Sister   . Cancer Neg Hx     EXAM: BP 138/87   Pulse (!) 109   Resp 14   Ht '5\' 9"'$  (1.753 m)   Wt 90 kg   SpO2 99%   BMI 29.30 kg/m  CONSTITUTIONAL: Alert and oriented and responds appropriately to questions.  Chronically ill-appearing, nontoxic HEAD: Normocephalic EYES: Conjunctivae clear, pupils appear equal, EOM appear intact ENT: normal nose; moist mucous membranes NECK: Supple, normal ROM CARD: Regular and tachycardic, S1 and S2 appreciated, systolic murmur appreciated, no rubs appreciated RESP: Normal chest excursion without splinting or tachypnea; breath sounds clear and equal bilaterally; no wheezes, no rhonchi, no rales, no hypoxia or respiratory distress, speaking full sentences ABD/GI: Normal bowel sounds; non-distended; soft, non-tender, no rebound, no guarding, no peritoneal signs, no hepatosplenomegaly BACK:  The back appears normal EXT: Normal ROM in all joints; no deformity noted, no edema; no cyanosis, no calf tenderness or calf  swelling SKIN: Necrotic appearing wounds to the bilateral forearms without sign of superimposed infection, no bleeding or drainage.  Also has a open wound to the left second toe with minimal surrounding redness and no drainage.  2+ radial and DP pulses bilaterally. NEURO: Moves all extremities equally PSYCH: The patient's mood and manner are appropriate.   MEDICAL DECISION MAKING: Patient here with chest pain and shortness of breath.  History of IV drug abuse and endocarditis.  Labs reviewed/interpreted.  He has been tachycardic here and has a leukocytosis.  Refuses rectal temperature but is warm to touch.  Oral temperature normal.  Blood cultures pending.  Concern for infectious etiology such as endocarditis, myocarditis.  EKG reviewed/interpreted and shows no new ischemic change.  Chest x-ray reviewed/interpreted and shows no acute abnormality.  Patient has had 2 elevated high-sensitivity troponins but they seem to be flat.  I am not necessary  concern for ACS at this time although he does have risk factors for the same.  Will discuss with hospitalist for admission.  He reports his PCP is with [facility].  ED PROGRESS: It appears patient has previously grown Streptococcus paralysis from his blood cultures and he has had wound cultures positive for MRSA.  Will give broad antibiotic coverage with vancomycin and Zosyn.   4:31 AM Discussed patient's case with hospitalist, Dr. Myna Hidalgo.  I have recommended admission and patient (and family if present) agree with this plan. Admitting physician will place admission orders.   I reviewed all nursing notes, vitals, pertinent previous records and reviewed/interpreted all EKGs, lab and urine results, imaging (as available).     EKG Interpretation  Date/Time:  Wednesday August 19 2019 22:26:04 EDT Ventricular Rate:  117 PR Interval:  138 QRS Duration: 74 QT Interval:  332 QTC Calculation: 463 R Axis:   29 Text Interpretation: Sinus tachycardia  Otherwise normal ECG No significant change since last tracing Confirmed by Merrily Pew [phone]) on 08/20/2019 12:07:54 AM       EKG Interpretation  Date/Time:  Thursday August 20 2019 03:29:27 EDT Ventricular Rate:  107 PR Interval:  138 QRS Duration: 82 QT Interval:  335 QTC Calculation: 447 R Axis:   44 Text Interpretation: Sinus tachycardia Probable left atrial enlargement Borderline T abnormalities, inferior leads Borderline ST elevation, anterolateral leads No significant change since last tracing Confirmed by Pryor Curia [phone]) on 08/20/2019 3:35:55 AM        CRITICAL CARE Performed by: Cyril Mourning Deloris Moger   Total critical care time: 45 minutes  Critical care time was exclusive of separately billable procedures and treating other patients.  Critical care was necessary to treat or prevent imminent or life-threatening deterioration.  Critical care was time spent personally by me on the following activities: development of treatment plan with patient and/or surrogate as well as nursing, discussions with consultants, evaluation of patient's response to treatment, examination of patient, obtaining history from patient or surrogate, ordering and performing treatments and interventions, ordering and review of laboratory studies, ordering and review of radiographic studies, pulse oximetry and re-evaluation of patient's condition.   Luke [name] was evaluated in Emergency Department on 08/20/2019 for the symptoms described in the history of present illness. He was evaluated in the context of the global COVID-19 pandemic, which necessitated consideration that the patient might be at risk for infection with the SARS-CoV-2 virus that causes COVID-19. Institutional protocols and algorithms that pertain to the evaluation of patients at risk for COVID-19 are in a state of rapid change based on information released by regulatory bodies including the CDC and federal and state organizations. These policies  and algorithms were followed during the patient's care in the ED.      Crista Nuon, Delice Bison, DO 08/20/19 [phone]

## 2019-08-20 NOTE — ED Notes (Signed)
Attempting to page MD about pt's elevated blood sugar, awaiting additional orders

## 2019-08-20 NOTE — ED Notes (Signed)
Left foot big toe has been amputated.Top of foot and lateral are extremely hard. Unable to feel  pulses in left foot.

## 2019-08-20 NOTE — Progress Notes (Signed)
Pt arrived from the ED to 4E room 07.  Telemetry monitor applied and CCMD notified.  CHG bath and skin assessment completed.  Patient oriented to unit and room to include call light and phone.  Will continue to monitor.

## 2019-08-20 NOTE — Progress Notes (Signed)
Hypoglycemic Event  CBG: 55  Treatment: 8 oz juice/soda  Symptoms: Sweaty  Follow-up CBG: Time:2200 CBG Result: 85  Possible Reasons for Event: too much insulin given prior?  Comments/MD notified: Kennon Holter, NP    Delle Reining

## 2019-08-20 NOTE — Progress Notes (Signed)
Arrived to gain consent for PICC placement. Discussed risks, benefits, and alternatives with patient. Patient requested waiting until 6-4 AM for placement. Butch Penny RN notified.

## 2019-08-20 NOTE — Progress Notes (Signed)
Pharmacy Antibiotic Note  Luke [name] is a 39 y.o. male admitted on 08/19/2019 with cellulitis.  Hx of endocarditis  Height: 5\' 9"  (175.3 cm) Weight: 90 kg (198 lb 6.6 oz) IBW/kg (Calculated) : 70.7  Temp (24hrs), Avg:98.7 F (37.1 C), Min:98.6 F (37 C), Max:98.8 F (37.1 C)  Recent Labs  Lab 08/19/19 2309  WBC 14.1*  CREATININE 1.54*  LATICACIDVEN 1.3    Estimated Creatinine Clearance: 71.4 mL/min (A) (by C-G formula based on SCr of 1.54 mg/dL (H)).    No Known Allergies  Plan: Vanc 1250 q12h Monitor renal fx cx vanc lvls prn  Barth Kirks, PharmD, BCPS, BCCCP Clinical Pharmacist [phone]  Please check AMION for all [facility] numbers  08/20/2019 5:11 AM

## 2019-08-21 ENCOUNTER — Ambulatory Visit: Payer: Commercial Managed Care - PPO | Admitting: Nurse Practitioner

## 2019-08-21 ENCOUNTER — Encounter: Payer: Self-pay | Admitting: Plastic Surgery

## 2019-08-21 ENCOUNTER — Inpatient Hospital Stay (HOSPITAL_COMMUNITY): Payer: Medicaid Other

## 2019-08-21 ENCOUNTER — Other Ambulatory Visit: Payer: Self-pay

## 2019-08-21 DIAGNOSIS — R079 Chest pain, unspecified: Secondary | ICD-10-CM

## 2019-08-21 LAB — BASIC METABOLIC PANEL
Anion gap: 10 (ref 5–15)
BUN: 15 mg/dL (ref 6–20)
CO2: 24 mmol/L (ref 22–32)
Calcium: 8.5 mg/dL — ABNORMAL LOW (ref 8.9–10.3)
Chloride: 102 mmol/L (ref 98–111)
Creatinine, Ser: 1.48 mg/dL — ABNORMAL HIGH (ref 0.61–1.24)
GFR calc Af Amer: >60 mL/min (ref >60)
GFR calc non Af Amer: 59 mL/min — ABNORMAL LOW (ref >60)
Glucose, Bld: 251 mg/dL — ABNORMAL HIGH (ref 70–99)
Potassium: 4 mmol/L (ref 3.5–5.1)
Sodium: 136 mmol/L (ref 135–145)

## 2019-08-21 LAB — CBC WITH DIFFERENTIAL/PLATELET
Abs Immature Granulocytes: 0.05 10*3/uL (ref 0.00–0.07)
Basophils Absolute: 0.1 10*3/uL (ref 0.0–0.1)
Basophils Relative: 1 %
Eosinophils Absolute: 0.4 10*3/uL (ref 0.0–0.5)
Eosinophils Relative: 3 %
HCT: 29.1 % — ABNORMAL LOW (ref 39.0–52.0)
Hemoglobin: 9.5 g/dL — ABNORMAL LOW (ref 13.0–17.0)
Immature Granulocytes: 0 %
Lymphocytes Relative: 24 %
Lymphs Abs: 3.4 10*3/uL (ref 0.7–4.0)
MCH: 26 pg (ref 26.0–34.0)
MCHC: 32.6 g/dL (ref 30.0–36.0)
MCV: 79.7 fL — ABNORMAL LOW (ref 80.0–100.0)
Monocytes Absolute: 1 10*3/uL (ref 0.1–1.0)
Monocytes Relative: 7 %
Neutro Abs: 9.3 10*3/uL — ABNORMAL HIGH (ref 1.7–7.7)
Neutrophils Relative %: 65 %
Platelets: 528 10*3/uL — ABNORMAL HIGH (ref 150–400)
RBC: 3.65 MIL/uL — ABNORMAL LOW (ref 4.22–5.81)
RDW: 14.5 % (ref 11.5–15.5)
WBC: 14.2 10*3/uL — ABNORMAL HIGH (ref 4.0–10.5)
nRBC: 0 % (ref 0.0–0.2)

## 2019-08-21 LAB — GLUCOSE, CAPILLARY
Glucose-Capillary: 55 mg/dL — ABNORMAL LOW (ref 70–99)
Glucose-Capillary: 266 mg/dL — ABNORMAL HIGH (ref 70–99)
Glucose-Capillary: 208 mg/dL — ABNORMAL HIGH (ref 70–99)
Glucose-Capillary: 372 mg/dL — ABNORMAL HIGH (ref 70–99)

## 2019-08-21 LAB — MAGNESIUM: Magnesium: 1.7 mg/dL (ref 1.7–2.4)

## 2019-08-21 MED ORDER — FUROSEMIDE 10 MG/ML IJ SOLN
40.0000 mg | Freq: Once | INTRAMUSCULAR | Status: AC
  Administered 2019-08-21: 40 mg via INTRAVENOUS
  Filled 2019-08-21: qty 4

## 2019-08-21 MED ORDER — TRAMADOL HCL 50 MG PO TABS: 50.0000 mg | ORAL_TABLET | Freq: Four times a day (QID) | ORAL | Status: DC | PRN

## 2019-08-21 MED ORDER — SODIUM CHLORIDE 0.9% FLUSH: 10.0000 mL | Freq: Two times a day (BID) | INTRAVENOUS | Status: DC

## 2019-08-21 MED ORDER — PIPERACILLIN-TAZOBACTAM 3.375 G IVPB 30 MIN
3.3750 g | Freq: Once | INTRAVENOUS | Status: AC
  Administered 2019-08-21: 3.375 g via INTRAVENOUS
  Filled 2019-08-21: qty 50

## 2019-08-21 MED ORDER — INSULIN GLARGINE 100 UNIT/ML ~~LOC~~ SOLN
40.0000 [IU] | Freq: Two times a day (BID) | SUBCUTANEOUS | Status: DC
  Administered 2019-08-21: 40 [IU] via SUBCUTANEOUS
  Filled 2019-08-21 (×2): qty 0.4

## 2019-08-21 MED ORDER — SODIUM CHLORIDE 0.9% FLUSH: 10.0000 mL | INTRAVENOUS | Status: DC | PRN

## 2019-08-21 MED ORDER — SODIUM CHLORIDE 0.9 % IV SOLN: 2.0000 g | INTRAVENOUS | Status: DC

## 2019-08-21 MED ORDER — ENOXAPARIN SODIUM 40 MG/0.4ML ~~LOC~~ SOLN
40.0000 mg | Freq: Every day | SUBCUTANEOUS | Status: DC
  Administered 2019-08-21: 40 mg via SUBCUTANEOUS
  Filled 2019-08-21: qty 0.4

## 2019-08-21 MED ORDER — PIPERACILLIN-TAZOBACTAM 3.375 G IVPB 30 MIN: 3.3750 g | Freq: Once | INTRAVENOUS | Status: DC

## 2019-08-21 MED ORDER — PIPERACILLIN-TAZOBACTAM 3.375 G IVPB
3.3750 g | Freq: Three times a day (TID) | INTRAVENOUS | Status: DC
  Filled 2019-08-21: qty 50

## 2019-08-21 MED ORDER — IOHEXOL 350 MG/ML SOLN
75.0000 mL | Freq: Once | INTRAVENOUS | Status: AC | PRN
  Administered 2019-08-21: 75 mL via INTRAVENOUS

## 2019-08-21 NOTE — Progress Notes (Signed)
Pt unfortunately adamant about leaving AMA.  Pt understands that PICC line must be removed and has expressed his willingness to cooperate with that.  MD aware and orders placed.  IV team consulted.  Will get Pt to sign AMA form.

## 2019-08-21 NOTE — Progress Notes (Signed)
Pharmacy Antibiotic Note  Luke [name] is a 39 y.o. male admitted on 08/19/2019 with abx for BL left arm cellulitis with deep ulcer and purulent drainage - concern for endocarditis.  Pharmacy has been consulted for Vanco/Zosyn dosing.   ID: abx for BL left arm cellulitis with deep ulcer and purulent drainage - concern for endocarditis? WBC 14.2, Tmax 99.9, Scr 1.48 down  - nonhealing ulceration at the dorsal aspect of his second toe on the left as well as nonhealing wounds involving the forearms bilaterally  Vanc 6/3>> Zosyn 6/4>>  6/2 blood x 2>>  Plan: Vanc 1250 q12h Add Zosyn 3.375g IV q8hr.   Height: 5\' 9"  (175.3 cm) Weight: 90 kg (198 lb 6.6 oz) IBW/kg (Calculated) : 70.7  Temp (24hrs), Avg:98.7 F (37.1 C), Min:98.1 F (36.7 C), Max:99.9 F (37.7 C)  Recent Labs  Lab 08/19/19 2309 08/21/19 0345  WBC 14.1* 14.2*  CREATININE 1.54* 1.48*  LATICACIDVEN 1.3  --     Estimated Creatinine Clearance: 74.3 mL/min (A) (by C-G formula based on SCr of 1.48 mg/dL (H)).    No Known Allergies  Luke [name] S. Alford Highland, PharmD, BCPS Clinical Staff Pharmacist [email or web address]  Wayland Salinas 08/21/2019 10:25 AM

## 2019-08-21 NOTE — Progress Notes (Signed)
Peripherally Inserted Central Catheter Placement  The IV Nurse has discussed with the patient and/or persons authorized to consent for the patient, the purpose of this procedure and the potential benefits and risks involved with this procedure.  The benefits include less needle sticks, lab draws from the catheter, and the patient may be discharged home with the catheter. Risks include, but not limited to, infection, bleeding, blood clot (thrombus formation), and puncture of an artery; nerve damage and irregular heartbeat and possibility to perform a PICC exchange if needed/ordered by physician.  Alternatives to this procedure were also discussed.  Bard Power PICC patient education guide, fact sheet on infection prevention and patient information card has been provided to patient /or left at bedside.    PICC Placement Documentation  PICC Single Lumen 60/16/58 PICC Right Basilic 40 cm 0 cm (Active)  Indication for Insertion or Continuance of Line Poor Vasculature-patient has had multiple peripheral attempts or PIVs lasting less than 24 hours 08/21/19 0900  Exposed Catheter (cm) 0 cm 08/21/19 0900  Site Assessment Clean;Dry;Intact 08/21/19 0900  Line Status Flushed;Saline locked;Blood return noted 08/21/19 0900  Dressing Type Transparent;Securing device 08/21/19 0900  Dressing Status Clean;Dry;Intact;Antimicrobial disc in place 08/21/19 0900  Dressing Change Due 08/28/19 08/21/19 0900       Holley Bouche Renee 08/21/2019, 9:29 AM

## 2019-08-21 NOTE — Progress Notes (Signed)
[facility] PROGRESS NOTE    Progress Note  Luke [name]  ZOX:[id number] DOB: 1980-09-13 DOA: 08/19/2019 PCP: Gildardo Pounds, NP     Brief Narrative:   Luke [name] is an 39 y.o. male  with medical history significant for IV heroin abuse, insulin-dependent diabetes mellitus, hypothyroidism, chronic kidney disease stage III, and history of endocarditis, now presenting to the emergency department for evaluation of chest pain and shortness of breath.  The patient reports developing pleuritic chest pain and shortness of breath approximately 1 week ago, has not been coughing much, has not noted any fevers or chills, and denies lower extremity swelling or tenderness.  He has a history of left great toe amputation and reports a nonhealing ulceration at the dorsal aspect of his second toe on the left as well as nonhealing wounds involving the forearms bilaterall  Assessment/Plan:   Chest pain: CXR no acute finding, EKGno signs of ischemia. No events on telemetry. CTA pending.  Bilateral Left arm Cellulitis with deep ulcer: Continue IV empiric antibiotics vancomycin and Zosyn. X-ray no osteomyelitis. Cont IV antibiotics, consult plastic surgery for possible debridement.  Uncontrolled insulin dependent type 1 diabetes mellitus [facility]): Increase long-acting insulin continue sliding scale.  Drug abuse, IV ([place]) Counseling, no further narcotics.    CKD (chronic kidney disease) stage 3, GFR 30-59 ml/min Cr seems to be at baseline, is ok to exchange midline to PICC line. Hypothyroidism Cont synthroid.    DVT prophylaxis: lovenox Family Communication:none Status is: Inpatient  Remains inpatient appropriate because:IV treatments appropriate due to intensity of illness or inability to take PO   Dispo: The patient is from: Home              Anticipated d/c is to: Home              Anticipated d/c date is: > 3 days              Patient currently is not medically stable to  d/c.         Code Status:  Code Status History    Date Active Date Inactive Code Status Order ID Comments User Context   05/20/2019 0125 05/29/2019 1944 Full Code [id number]  Chauncey Mann, MD ED   05/09/2019 0056 05/16/2019 1931 Full Code [id number]  Etta Quill, DO ED   03/19/2019 0228 03/19/2019 1512 Full Code [id number]  Vianne Bulls, MD ED   11/21/2017 2011 12/12/2017 2021 Full Code [id number]  Patrecia Pour, MD Inpatient   07/21/2017 2047 07/25/2017 1744 Full Code [id number]  Ivor Costa, MD ED   12/17/2016 0119 12/19/2016 1436 Full Code [id number]  Jani Gravel, MD ED   12/03/2014 0143 12/05/2014 1824 Full Code [id number]  Lavina Hamman, MD ED   11/23/2013 1425 11/25/2013 1530 Full Code [id number]  Melvenia Needles, NP ED   10/14/2012 0223 10/23/2012 2205 Full Code [id number]  Berle Mull, MD Inpatient   10/11/2012 0434 10/13/2012 1430 Full Code [id number]  Orvan Falconer, MD Inpatient   Advance Care Planning Activity        IV Access:    Peripheral IV   Procedures and diagnostic studies:   DG Chest 2 View  Result Date: 08/19/2019 CLINICAL DATA:  Chest pain and shortness of breath EXAM: CHEST - 2 VIEW COMPARISON:  05/22/2019 FINDINGS: Cardiac shadow is within limits. Central line is been removed in the interval. The lungs are well aerated with minimal scarring in the left base. No effusion or infiltrate  is seen. No bony abnormality is noted. IMPRESSION: Chronic changes without acute abnormality. Electronically Signed   By: Inez Catalina M.D.   On: 08/19/2019 23:20   DG Forearm Left  Result Date: 08/20/2019 CLINICAL DATA:  Cellulitis, IV drug use EXAM: LEFT FOREARM - 2 VIEW COMPARISON:  None. FINDINGS: There is no evidence of fracture or other focal bone lesions. There is diffuse subcutaneous edema seen along the volar and dorsal surface of the forearm. There is possible subcutaneous emphysema seen along the posterior olecranon with a focal area of ulceration. A IMPRESSION: No acute osseous  abnormality. Diffuse subcutaneous edema and possible area of ulceration seen on the posterior forearm. Electronically Signed   By: Prudencio Pair M.D.   On: 08/20/2019 05:21   DG CHEST PORT 1 VIEW  Result Date: 08/20/2019 CLINICAL DATA:  Short of breath, central and left-sided chest pain for several hours EXAM: PORTABLE CHEST 1 VIEW COMPARISON:  08/19/2019 FINDINGS: Single frontal view of the chest demonstrates stable cardiac silhouette. Since the prior exam, bilateral perihilar airspace disease has developed. No significant effusion or pneumothorax. No acute bony abnormalities. IMPRESSION: 1. Bilateral perihilar airspace disease most consistent with pulmonary edema given rapid development since yesterday. Electronically Signed   By: Randa Ngo M.D.   On: 08/20/2019 23:25   [place] EKG SITE RITE  Result Date: 08/20/2019 If Site Rite image not attached, placement could not be confirmed due to current cardiac rhythm.    Medical Consultants:    None.  Anti-Infectives:   vanc  Subjective:    Townsend D Miers shortness of breath and chest pain resolved.  Objective:    Vitals:   08/20/19 2014 08/20/19 2339 08/21/19 0424 08/21/19 0803  BP: 116/65 120/73 (!) 110/56 117/68  Pulse: 89 92  94  Resp: 15 (!) 21  (!) 22  Temp: 98.5 F (36.9 C) 99.1 F (37.3 C) 99.9 F (37.7 C) 98.4 F (36.9 C)  TempSrc: Oral Oral Oral Oral  SpO2: 96% 97%  96%  Weight:      Height:       SpO2: 96 % O2 Flow Rate (L/min): 3 L/min   Intake/Output Summary (Last 24 hours) at 08/21/2019 0936 Last data filed at 08/21/2019 0542 Gross per 24 hour  Intake 1394.01 ml  Output 1800 ml  Net -405.99 ml   Filed Weights   08/19/19 2237  Weight: 90 kg    Exam: General exam: In no acute distress. Respiratory system: Good air movement and clear to auscultation. Cardiovascular system: S1 & S2 heard, RRR. No JVD. Gastrointestinal system: Abdomen is nondistended, soft and nontender.  Skin: Lateral upper extremity ulcer  with scar and purulent drainage malodorous.  There is surrounding erythema. Psychiatry: Judgement and insight appear normal.   Data Reviewed:    Labs: Basic Metabolic Panel: Recent Labs  Lab 08/19/19 2309 08/21/19 0345  NA 137 136  K 3.4* 4.0  CL 99 102  CO2 24 24  GLUCOSE 274* 251*  BUN 13 15  CREATININE 1.54* 1.48*  CALCIUM 9.1 8.5*  MG  --  1.7   GFR Estimated Creatinine Clearance: 74.3 mL/min (A) (by C-G formula based on SCr of 1.48 mg/dL (H)). Liver Function Tests: No results for input(s): AST, ALT, ALKPHOS, BILITOT, PROT, ALBUMIN in the last 168 hours. No results for input(s): LIPASE, AMYLASE in the last 168 hours. No results for input(s): AMMONIA in the last 168 hours. Coagulation profile Recent Labs  Lab 08/19/19 2309  INR 1.0   COVID-19  Labs  No results for input(s): DDIMER, FERRITIN, LDH, CRP in the last 72 hours.  Lab Results  Component Value Date   SARSCOV2NAA NEGATIVE 08/20/2019   [place] NEGATIVE 05/09/2019   [place] NEGATIVE 03/19/2019   SARSCOV2NAA NOT DETECTED 09/05/2018    CBC: Recent Labs  Lab 08/19/19 2309 08/21/19 0345  WBC 14.1* 14.2*  NEUTROABS  --  9.3*  HGB 10.0* 9.5*  HCT 32.0* 29.1*  MCV 82.3 79.7*  PLT 574* 528*   Cardiac Enzymes: No results for input(s): CKTOTAL, CKMB, CKMBINDEX, TROPONINI in the last 168 hours. BNP (last 3 results) No results for input(s): PROBNP in the last 8760 hours. CBG: Recent Labs  Lab 08/20/19 1418 08/20/19 1605 08/20/19 2129 08/20/19 2211 08/21/19 0619  GLUCAP 363* 192* 55* 85 266*   D-Dimer: No results for input(s): DDIMER in the last 72 hours. Hgb A1c: No results for input(s): HGBA1C in the last 72 hours. Lipid Profile: No results for input(s): CHOL, HDL, LDLCALC, TRIG, CHOLHDL, LDLDIRECT in the last 72 hours. Thyroid function studies: No results for input(s): TSH, T4TOTAL, T3FREE, THYROIDAB in the last 72 hours.  Invalid input(s): FREET3 Anemia work up: No results for  input(s): VITAMINB12, FOLATE, FERRITIN, TIBC, IRON, RETICCTPCT in the last 72 hours. Sepsis Labs: Recent Labs  Lab 08/19/19 2309 08/21/19 0345  WBC 14.1* 14.2*  LATICACIDVEN 1.3  --    Microbiology Recent Results (from the past 240 hour(s))  Culture, blood (Routine x 2)     Status: None (Preliminary result)   Collection Time: 08/19/19 10:55 PM   Specimen: BLOOD  Result Value Ref Range Status   Specimen Description BLOOD RIGHT ARM  Final   Special Requests   Final    BOTTLES DRAWN AEROBIC AND ANAEROBIC Blood Culture adequate volume   Culture   Final    NO GROWTH < 12 HOURS Performed at [facility], [address] [place], [place] [address]    Report Status PENDING  Incomplete  Culture, blood (Routine x 2)     Status: None (Preliminary result)   Collection Time: 08/19/19 11:09 PM   Specimen: BLOOD  Result Value Ref Range Status   Specimen Description BLOOD LEFT ARM  Final   Special Requests   Final    BOTTLES DRAWN AEROBIC ONLY Blood Culture adequate volume   Culture   Final    NO GROWTH < 12 HOURS Performed at [facility], [place] [address] [place], [place] [address]    Report Status PENDING  Incomplete  SARS Coronavirus 2 by RT PCR (hospital order, performed in [facility] hospital lab) Nasopharyngeal Nasopharyngeal Swab     Status: None   Collection Time: 08/20/19  9:10 AM   Specimen: Nasopharyngeal Swab  Result Value Ref Range Status   SARS Coronavirus 2 NEGATIVE NEGATIVE Final    Comment: (NOTE) SARS-CoV-2 target nucleic acids are NOT DETECTED. The SARS-CoV-2 RNA is generally detectable in upper and lower respiratory specimens during the acute phase of infection. The lowest concentration of SARS-CoV-2 viral copies this assay can detect is 250 copies / mL. A negative result does not preclude SARS-CoV-2 infection and should not be used as the sole basis for treatment or other patient management decisions.  A negative result may occur with improper  specimen collection / handling, submission of specimen other than nasopharyngeal swab, presence of viral mutation(s) within the areas targeted by this assay, and inadequate number of viral copies (<250 copies / mL). A negative result must be combined  with clinical observations, patient history, and epidemiological information. Fact Sheet for Patients:   StrictlyIdeas.no Fact Sheet for Healthcare Providers: BankingDealers.co.za This test is not yet approved or cleared  by the [place] FDA and has been authorized for detection and/or diagnosis of SARS-CoV-2 by FDA under an Emergency Use Authorization (EUA).  This EUA will remain in effect (meaning this test can be used) for the duration of the COVID-19 declaration under Section 564(b)(1) of the Act, 21 U.S.C. section 360bbb-3(b)(1), unless the authorization is terminated or revoked sooner. Performed at [facility], [place] [address], [place], [place] [address]      Medications:   . atorvastatin  20 mg Oral Daily  . carvedilol  12.5 mg Oral BID WC  . Chlorhexidine Gluconate Cloth  6 each Topical Daily  . collagenase  1 application Topical Daily  . DULoxetine  30 mg Oral Daily  . enoxaparin (LOVENOX) injection  40 mg Subcutaneous Daily  . gabapentin  400 mg Oral TID  . insulin aspart  0-15 Units Subcutaneous TID WC  . insulin aspart  0-5 Units Subcutaneous QHS  . insulin aspart  6 Units Subcutaneous TID WC  . insulin glargine  25 Units Subcutaneous BID  . levothyroxine  50 mcg Oral Q0600  . LORazepam  2 mg Intravenous Once  . pantoprazole  40 mg Oral Daily  . sertraline  50 mg Oral Daily  . sodium chloride flush  10-40 mL Intracatheter Q12H  . sodium chloride flush  10-40 mL Intracatheter Q12H  . sodium chloride flush  3 mL Intravenous Q12H  . traZODone  50 mg Oral QHS   Continuous Infusions: . sodium chloride    . vancomycin 1,250 mg (08/21/19 0548)      LOS: 1 day    Charlynne Cousins  [facility]  08/21/2019, 9:36 AM

## 2019-08-21 NOTE — Consult Note (Signed)
Reason for Consult: Bilateral arm wounds Referring Physician: Dr. Bess Harvest Kaseem Vastine is an 39 y.o. male.  HPI: Patient is a 39 year old male who presented to the emergency department on 08/19/2019 for evaluation of chest pain and shortness of breath for approximately 1 week.  He has a medical history significant for IV heroin abuse, insulin-dependent diabetes mellitus, hypothyroidism, CKD stage III, and history of endocarditis.  He has bilateral forearm nonhealing wounds.  He reports the wound on his right arm was debrided approximately 2 months ago and has never completely healed.  He reports the wound on the left arm developed more recently and has never been debrided previously. Denies F, N/V. Reports chest pain, SOB, and chronic back pain.  Past Medical History:  Diagnosis Date  . Anemia   . Anxiety  Dx 2008  . Chronic kidney disease   . Diabetes mellitus without complication ([place]) Dx 4627  . DKA (diabetic ketoacidoses) ([place]) 07/21/2017  . GERD (gastroesophageal reflux disease) Dx 2008  . Headache(784.0)   . Hypertension   . Pneumonia   . Seizures ([place]) 2011    x 2 in lifetime. on Dilantin for a while.   . Substance abuse ([place]) 2013    heroin use, multiple relapses    Past Surgical History:  Procedure Laterality Date  . AMPUTATION Left 09/05/2018   Procedure: LEFT GREAT TOE AMPUTATION;  Surgeon: [name], MD;  Location: [facility];  Service: Orthopedics;  Laterality: Left;  . I & D EXTREMITY Left 10/11/2012   Procedure: IRRIGATION AND DEBRIDEMENT ABSCESS FOREARM;  Surgeon: Linna Hoff, MD;  Location: [facility];  Service: Orthopedics;  Laterality: Left;  . I & D EXTREMITY Left 10/12/2012   Procedure: IRRIGATION AND DEBRIDEMENT FOREARM;  Surgeon: Linna Hoff, MD;  Location: [facility];  Service: Orthopedics;  Laterality: Left;  . I & D EXTREMITY Left 10/14/2012   Procedure: incision and drainage left forearm;  Surgeon: Linna Hoff, MD;  Location: [facility];  Service:  Orthopedics;  Laterality: Left;  . I & D EXTREMITY Left 10/16/2012   Procedure: IRRIGATION AND DEBRIDEMENT LEFT FOREARM;  Surgeon: Linna Hoff, MD;  Location: [facility];  Service: Orthopedics;  Laterality: Left;  . I & D EXTREMITY Left 10/20/2012   Procedure: INCISION AND DRAINAGE AND DEBRIDEMENT LEFT  FOREARM;  Surgeon: Linna Hoff, MD;  Location: [facility];  Service: Orthopedics;  Laterality: Left;  . I & D EXTREMITY Right 05/22/2019   Procedure: IRRIGATION AND DEBRIDEMENT RIGHT ARM;  Surgeon: [name], MD;  Location: [facility];  Service: Orthopedics;  Laterality: Right;  . INCISION AND DRAINAGE OF WOUND Bilateral 11/29/2017   Procedure: DEBRIDEMENT BILATERAL FEET, DEBRIDEMENT LEFT ANKLE, AND APPLY WOUND VAC;  Surgeon: [name], MD;  Location: [facility];  Service: Orthopedics;  Laterality: Bilateral;  . IRRIGATION AND DEBRIDEMENT ABSCESS     Hx: of left arm abscess related to drug use   . TEE WITHOUT CARDIOVERSION N/A 11/28/2017   Procedure: TRANSESOPHAGEAL ECHOCARDIOGRAM (TEE);  Surgeon: Sanda Klein, MD;  Location: [facility];  Service: Cardiovascular;  Laterality: N/A;  . TEE WITHOUT CARDIOVERSION N/A 05/12/2019   Procedure: TRANSESOPHAGEAL ECHOCARDIOGRAM (TEE);  Surgeon: Skeet Latch, MD;  Location: [facility] ENDOSCOPY;  Service: Cardiovascular;  Laterality: N/A;    Family History  Problem Relation Age of Onset  . Diabetes Father   . Heart disease Father   . Mental illness Sister   . Cancer Neg Hx  Social History:  reports that he has been smoking cigarettes. He has been smoking about 1.00 pack per day. He has never used smokeless tobacco. He reports previous drug use. Drugs: Cocaine, IV, and Heroin. He reports that he does not drink alcohol.  Allergies: No Known Allergies  Medications: I have reviewed the patient's current medications.  Results for orders placed or performed during the hospital encounter of 08/19/19 (from the past 48 hour(s))  Culture, blood (Routine x 2)      Status: None (Preliminary result)   Collection Time: 08/19/19 10:55 PM   Specimen: BLOOD  Result Value Ref Range   Specimen Description BLOOD RIGHT ARM    Special Requests      BOTTLES DRAWN AEROBIC AND ANAEROBIC Blood Culture adequate volume   Culture      NO GROWTH 1 DAY Performed at [facility], [place] [address], [place], [place] [address]    Report Status PENDING   Basic metabolic panel     Status: Abnormal   Collection Time: 08/19/19 11:09 PM  Result Value Ref Range   Sodium 137 135 - 145 mmol/L   Potassium 3.4 (L) 3.5 - 5.1 mmol/L   Chloride 99 98 - 111 mmol/L   CO2 24 22 - 32 mmol/L   Glucose, Bld 274 (H) 70 - 99 mg/dL    Comment: Glucose reference range applies only to samples taken after fasting for at least 8 hours.   BUN 13 6 - 20 mg/dL   Creatinine, Ser 1.54 (H) 0.61 - 1.24 mg/dL   Calcium 9.1 8.9 - 10.3 mg/dL   GFR calc non Af Amer 56 (L) >60 mL/min   GFR calc Af Amer >60 >60 mL/min   Anion gap 14 5 - 15    Comment: Performed at [facility] [address], [place], [place] [address]  CBC     Status: Abnormal   Collection Time: 08/19/19 11:09 PM  Result Value Ref Range   WBC 14.1 (H) 4.0 - 10.5 K/uL   RBC 3.89 (L) 4.22 - 5.81 MIL/uL   Hemoglobin 10.0 (L) 13.0 - 17.0 g/dL   HCT 32.0 (L) 39.0 - 52.0 %   MCV 82.3 80.0 - 100.0 fL   MCH 25.7 (L) 26.0 - 34.0 pg   MCHC 31.3 30.0 - 36.0 g/dL   RDW 14.5 11.5 - 15.5 %   Platelets 574 (H) 150 - 400 K/uL   nRBC 0.0 0.0 - 0.2 %    Comment: Performed at [facility], [place] [address] [place], [place] [address]  Troponin I (High Sensitivity)     Status: Abnormal   Collection Time: 08/19/19 11:09 PM  Result Value Ref Range   Troponin I (High Sensitivity) 138 (HH) <18 ng/L    Comment: CRITICAL RESULT CALLED TO, READ BACK BY AND VERIFIED WITH: OLDLAND B,RN 08/20/19 0028 WAYK Performed at [facility], [place] [address], [place], [place] [address]   Lactic acid, plasma     Status: None   Collection  Time: 08/19/19 11:09 PM  Result Value Ref Range   Lactic Acid, Venous 1.3 0.5 - 1.9 mmol/L    Comment: Performed at [facility] [address] [place], [place] [address]  Protime-INR     Status: None   Collection Time: 08/19/19 11:09 PM  Result Value Ref Range   Prothrombin Time 13.0 11.4 - 15.2 seconds   INR 1.0 0.8 - 1.2    Comment: (NOTE) INR  goal varies based on device and disease states. Performed at [facility], [place] [address], [place], [place] [address]   Culture, blood (Routine x 2)     Status: None (Preliminary result)   Collection Time: 08/19/19 11:09 PM   Specimen: BLOOD  Result Value Ref Range   Specimen Description BLOOD LEFT ARM    Special Requests      BOTTLES DRAWN AEROBIC ONLY Blood Culture adequate volume   Culture      NO GROWTH 1 DAY Performed at [facility], [place] [address], [place], [place] [address]    Report Status PENDING   Troponin I (High Sensitivity)     Status: Abnormal   Collection Time: 08/20/19 12:49 AM  Result Value Ref Range   Troponin I (High Sensitivity) 144 (HH) <18 ng/L    Comment: CRITICAL VALUE NOTED.  VALUE IS CONSISTENT WITH PREVIOUSLY REPORTED AND CALLED VALUE. (NOTE) Elevated high sensitivity troponin I (hsTnI) values and significant  changes across serial measurements may suggest ACS but many other  chronic and acute conditions are known to elevate hsTnI results.  Refer to the Links section for chest pain algorithms and additional  guidance. Performed at [facility], [place] [address], [place], [place] [address]   Urinalysis, Routine w reflex microscopic     Status: Abnormal   Collection Time: 08/20/19  5:04 AM  Result Value Ref Range   Color, Urine YELLOW YELLOW   APPearance CLEAR CLEAR   Specific Gravity, Urine 1.026 1.005 - 1.030   pH 6.0 5.0 - 8.0   Glucose, UA >=500 (A) NEGATIVE mg/dL   Hgb urine dipstick SMALL (A) NEGATIVE   Bilirubin Urine NEGATIVE NEGATIVE   Ketones, ur NEGATIVE NEGATIVE  mg/dL   Protein, ur >=300 (A) NEGATIVE mg/dL   Nitrite NEGATIVE NEGATIVE   Leukocytes,Ua NEGATIVE NEGATIVE   RBC / HPF 0-5 0 - 5 RBC/hpf   WBC, UA 0-5 0 - 5 WBC/hpf   Bacteria, UA NONE SEEN NONE SEEN   Mucus PRESENT    Hyaline Casts, UA PRESENT     Comment: Performed at [facility] [address], [place], [place] [address]  Troponin I (High Sensitivity)     Status: Abnormal   Collection Time: 08/20/19  6:10 AM  Result Value Ref Range   Troponin I (High Sensitivity) 91 (H) <18 ng/L    Comment: (NOTE) Elevated high sensitivity troponin I (hsTnI) values and significant  changes across serial measurements may suggest ACS but many other  chronic and acute conditions are known to elevate hsTnI results.  Refer to the Links section for chest pain algorithms and additional  guidance. Performed at [facility], [place] [address], [place], [place] [address]   SARS Coronavirus 2 by RT PCR (hospital order, performed in [facility] hospital lab) Nasopharyngeal Nasopharyngeal Swab     Status: None   Collection Time: 08/20/19  9:10 AM   Specimen: Nasopharyngeal Swab  Result Value Ref Range   SARS Coronavirus 2 NEGATIVE NEGATIVE    Comment: (NOTE) SARS-CoV-2 target nucleic acids are NOT DETECTED. The SARS-CoV-2 RNA is generally detectable in upper and lower respiratory specimens during the acute phase of infection. The lowest concentration of SARS-CoV-2 viral copies this assay can detect is 250 copies / mL. A negative result does not preclude SARS-CoV-2 infection and should not be used as the sole basis for treatment or other patient management decisions.  A negative result may occur with improper  specimen collection / handling, submission of specimen other than nasopharyngeal swab, presence of viral mutation(s) within the areas targeted by this assay, and inadequate number of viral copies (<250 copies / mL). A negative result must be combined with clinical observations, patient  history, and epidemiological information. Fact Sheet for Patients:   StrictlyIdeas.no Fact Sheet for Healthcare Providers: BankingDealers.co.za This test is not yet approved or cleared  by the [place] FDA and has been authorized for detection and/or diagnosis of SARS-CoV-2 by FDA under an Emergency Use Authorization (EUA).  This EUA will remain in effect (meaning this test can be used) for the duration of the COVID-19 declaration under Section 564(b)(1) of the Act, 21 U.S.C. section 360bbb-3(b)(1), unless the authorization is terminated or revoked sooner. Performed at [facility], [place] [address] [place], [place] [address]   Troponin I (High Sensitivity)     Status: Abnormal   Collection Time: 08/20/19 10:07 AM  Result Value Ref Range   Troponin I (High Sensitivity) 77 (H) <18 ng/L    Comment: (NOTE) Elevated high sensitivity troponin I (hsTnI) values and significant  changes across serial measurements may suggest ACS but many other  chronic and acute conditions are known to elevate hsTnI results.  Refer to the "Links" section for chest pain algorithms and additional  guidance. Performed at [facility], [place] [address], [place], [place] [address]   CBG monitoring, ED     Status: Abnormal   Collection Time: 08/20/19 12:13 PM  Result Value Ref Range   Glucose-Capillary 448 (H) 70 - 99 mg/dL    Comment: Glucose reference range applies only to samples taken after fasting for at least 8 hours.  CBG monitoring, ED     Status: Abnormal   Collection Time: 08/20/19  1:19 PM  Result Value Ref Range   Glucose-Capillary 502 (HH) 70 - 99 mg/dL    Comment: Glucose reference range applies only to samples taken after fasting for at least 8 hours.   Comment 1 Notify RN   CBG monitoring, ED     Status: Abnormal   Collection Time: 08/20/19  1:52 PM  Result Value Ref Range   Glucose-Capillary 523 (HH) 70 - 99 mg/dL    Comment:  Glucose reference range applies only to samples taken after fasting for at least 8 hours.   Comment 1 Notify RN   CBG monitoring, ED     Status: Abnormal   Collection Time: 08/20/19  2:18 PM  Result Value Ref Range   Glucose-Capillary 363 (H) 70 - 99 mg/dL    Comment: Glucose reference range applies only to samples taken after fasting for at least 8 hours.  Glucose, capillary     Status: Abnormal   Collection Time: 08/20/19  4:05 PM  Result Value Ref Range   Glucose-Capillary 192 (H) 70 - 99 mg/dL    Comment: Glucose reference range applies only to samples taken after fasting for at least 8 hours.  Glucose, capillary     Status: Abnormal   Collection Time: 08/20/19  9:29 PM  Result Value Ref Range   Glucose-Capillary 55 (L) 70 - 99 mg/dL    Comment: Glucose reference range applies only to samples taken after fasting for at least 8 hours.  Glucose, capillary     Status: None   Collection Time: 08/20/19 10:11 PM  Result Value Ref Range   Glucose-Capillary 85 70 - 99 mg/dL    Comment: Glucose reference range applies only to samples taken  after fasting for at least 8 hours.  CBC WITH DIFFERENTIAL     Status: Abnormal   Collection Time: 08/21/19  3:45 AM  Result Value Ref Range   WBC 14.2 (H) 4.0 - 10.5 K/uL   RBC 3.65 (L) 4.22 - 5.81 MIL/uL   Hemoglobin 9.5 (L) 13.0 - 17.0 g/dL   HCT 29.1 (L) 39.0 - 52.0 %   MCV 79.7 (L) 80.0 - 100.0 fL   MCH 26.0 26.0 - 34.0 pg   MCHC 32.6 30.0 - 36.0 g/dL   RDW 14.5 11.5 - 15.5 %   Platelets 528 (H) 150 - 400 K/uL   nRBC 0.0 0.0 - 0.2 %   Neutrophils Relative % 65 %   Neutro Abs 9.3 (H) 1.7 - 7.7 K/uL   Lymphocytes Relative 24 %   Lymphs Abs 3.4 0.7 - 4.0 K/uL   Monocytes Relative 7 %   Monocytes Absolute 1.0 0.1 - 1.0 K/uL   Eosinophils Relative 3 %   Eosinophils Absolute 0.4 0.0 - 0.5 K/uL   Basophils Relative 1 %   Basophils Absolute 0.1 0.0 - 0.1 K/uL   Immature Granulocytes 0 %   Abs Immature Granulocytes 0.05 0.00 - 0.07 K/uL     Comment: Performed at [facility], [address] [place], [place] [address]  Basic metabolic panel     Status: Abnormal   Collection Time: 08/21/19  3:45 AM  Result Value Ref Range   Sodium 136 135 - 145 mmol/L   Potassium 4.0 3.5 - 5.1 mmol/L   Chloride 102 98 - 111 mmol/L   CO2 24 22 - 32 mmol/L   Glucose, Bld 251 (H) 70 - 99 mg/dL    Comment: Glucose reference range applies only to samples taken after fasting for at least 8 hours.   BUN 15 6 - 20 mg/dL   Creatinine, Ser 1.48 (H) 0.61 - 1.24 mg/dL   Calcium 8.5 (L) 8.9 - 10.3 mg/dL   GFR calc non Af Amer 59 (L) >60 mL/min   GFR calc Af Amer >60 >60 mL/min   Anion gap 10 5 - 15    Comment: Performed at [facility] [address] [place], [place] [address]  Magnesium     Status: None   Collection Time: 08/21/19  3:45 AM  Result Value Ref Range   Magnesium 1.7 1.7 - 2.4 mg/dL    Comment: Performed at [facility] [address], [place], [place] [address]  Glucose, capillary     Status: Abnormal   Collection Time: 08/21/19  6:19 AM  Result Value Ref Range   Glucose-Capillary 266 (H) 70 - 99 mg/dL    Comment: Glucose reference range applies only to samples taken after fasting for at least 8 hours.  Glucose, capillary     Status: Abnormal   Collection Time: 08/21/19 12:26 PM  Result Value Ref Range   Glucose-Capillary 208 (H) 70 - 99 mg/dL    Comment: Glucose reference range applies only to samples taken after fasting for at least 8 hours.    DG Chest 2 View  Result Date: 08/19/2019 CLINICAL DATA:  Chest pain and shortness of breath EXAM: CHEST - 2 VIEW COMPARISON:  05/22/2019 FINDINGS: Cardiac shadow is within limits. Central line is been removed in the interval. The lungs are well aerated with minimal scarring in the left base. No effusion or infiltrate is seen. No bony abnormality is noted. IMPRESSION: Chronic changes without acute abnormality. Electronically  Signed   By: Inez Catalina M.D.   On: 08/19/2019  23:20   DG Forearm Left  Result Date: 08/20/2019 CLINICAL DATA:  Cellulitis, IV drug use EXAM: LEFT FOREARM - 2 VIEW COMPARISON:  None. FINDINGS: There is no evidence of fracture or other focal bone lesions. There is diffuse subcutaneous edema seen along the volar and dorsal surface of the forearm. There is possible subcutaneous emphysema seen along the posterior olecranon with a focal area of ulceration. A IMPRESSION: No acute osseous abnormality. Diffuse subcutaneous edema and possible area of ulceration seen on the posterior forearm. Electronically Signed   By: Prudencio Pair M.D.   On: 08/20/2019 05:21   CT ANGIO CHEST PE W OR WO CONTRAST  Result Date: 08/21/2019 CLINICAL DATA:  Shortness of breath and chest pain. History of IV drug abuse. Evaluate for possible septic emboli. EXAM: CT ANGIOGRAPHY CHEST WITH CONTRAST TECHNIQUE: Multidetector CT imaging of the chest was performed using the standard protocol during bolus administration of intravenous contrast. Multiplanar CT image reconstructions and MIPs were obtained to evaluate the vascular anatomy. CONTRAST:  27mL OMNIPAQUE IOHEXOL 350 MG/ML SOLN COMPARISON:  Chest x-ray from yesterday. FINDINGS: Cardiovascular: The heart is normal in size. No pericardial effusion. The aorta is normal in caliber. No dissection. No atherosclerotic calcifications. Branch vessels are patent. No coronary artery calcifications. Mild pulmonary artery enlargement could suggest pulmonary hypertension. No filling defects to suggest pulmonary embolism. Mediastinum/Nodes: Scattered borderline enlarged mediastinal and hilar lymph nodes which could be reactive/inflammatory. There is also some residual thymic tissue noted in the anterior mediastinum. Enlarged axillary lymph nodes are also noted bilaterally measuring between 12 and 19 mm. The esophagus is grossly normal. Lungs/Pleura: Patchy ill-defined areas of faint ground-glass opacity in both lungs but most significant in the left  lower lobe. Findings could reflect asymmetric pulmonary edema but more likely atypical/viral pneumonia. Recommend correlation with COVID status. There are bilateral pleural effusions somewhat atypical with COVID pneumonia. I do not see any findings suspicious for septic emboli and there is no focal airspace consolidation or worrisome pulmonary lesions. Upper Abdomen: No significant upper abdominal findings. Musculoskeletal: No significant bony findings. Mild wedging of some of the thoracic vertebral bodies and Schmorl's nodes which could suggest Scheuermann's disease. I do not see any findings worrisome for discitis, osteomyelitis or septic arthritis. Review of the MIP images confirms the above findings. IMPRESSION: 1. No CT findings for pulmonary embolism. 2. Mild pulmonary artery enlargement could suggest pulmonary hypertension. 3. Normal thoracic aorta. 4. Patchy ill-defined areas of faint ground-glass opacity in both lungs but most significant in the left lower lobe. Findings could reflect asymmetric pulmonary edema but more likely atypical/viral pneumonia. Recommend correlation with COVID status. 5. Bilateral pleural effusions, somewhat atypical with COVID pneumonia. 6. Mediastinal, hilar and axillary adenopathy, possibly reactive/inflammatory. 7. No findings worrisome for discitis, osteomyelitis or septic arthritis. Electronically Signed   By: Marijo Sanes M.D.   On: 08/21/2019 13:41   DG CHEST PORT 1 VIEW  Result Date: 08/20/2019 CLINICAL DATA:  Short of breath, central and left-sided chest pain for several hours EXAM: PORTABLE CHEST 1 VIEW COMPARISON:  08/19/2019 FINDINGS: Single frontal view of the chest demonstrates stable cardiac silhouette. Since the prior exam, bilateral perihilar airspace disease has developed. No significant effusion or pneumothorax. No acute bony abnormalities. IMPRESSION: 1. Bilateral perihilar airspace disease most consistent with pulmonary edema given rapid development since  yesterday. Electronically Signed   By: Randa Ngo M.D.   On: 08/20/2019 23:25  [place] EKG SITE RITE  Result Date: 08/20/2019 If Site Rite image not attached, placement could not be confirmed due to current cardiac rhythm.   Review of Systems  Constitutional: Negative for chills and fever.  HENT: Negative for congestion and sore throat.   Respiratory: Positive for shortness of breath. Negative for cough.   Cardiovascular: Positive for chest pain.  Gastrointestinal: Negative for abdominal pain, nausea and vomiting.  Musculoskeletal: Positive for back pain (reports chronic back pain from previous injury). Negative for joint pain, myalgias and neck pain.       Bilateral forearm open wounds. Right arm wound debrided ~ 2 months ago and has never completely healed.  Skin: Negative for itching and rash.   Blood pressure 117/68, pulse 94, temperature 98.4 F (36.9 C), temperature source Oral, resp. rate (!) 22, height 5\' 9"  (1.753 m), weight 90 kg, SpO2 96 %. Physical Exam  Vitals reviewed. Constitutional: He is oriented to person, place, and time. He appears well-developed and well-nourished. No distress.  HENT:  Head: Normocephalic and atraumatic.  Eyes: EOM are normal.  Respiratory: Effort normal.  Nasal cannula in place.   Musculoskeletal:       Arms:     Comments: Open wounds blilateral forarms with purulent drainage. Scaring present on skin surrounding wounds, with areas of hardness.   Neurological: He is alert and oriented to person, place, and time.  Skin: Skin is warm. No rash noted. No pallor.  Psychiatric: He has a normal mood and affect. His behavior is normal. Judgment and thought content normal.          Assessment/Plan: Patient has bilateral open forearm wounds with purulent drainage. Surrounding areas have scaring and some areas of hardness.    Agree with current ABX regimen and wound care. Recommend keeping sugar levels well controlled to promote better wound  healing.  Will discuss case and possible wound debridement in the OR with plastics team and update plan accordingly.   Appreciate opportunity to consult on this patient.  Threasa Heads, PA-C 08/21/2019, 2:54 PM

## 2019-08-21 NOTE — Plan of Care (Signed)
?  Problem: Health Behavior/Discharge Planning: ?Goal: Ability to manage health-related needs will improve ?Outcome: Not Progressing ?  ?Problem: Clinical Measurements: ?Goal: Will remain free from infection ?Outcome: Not Progressing ?Goal: Respiratory complications will improve ?Outcome: Not Progressing ?  ?

## 2019-08-21 NOTE — Progress Notes (Signed)
Pt complaining of difficulty breathing.  SPO2 100% on 2L.  Lungs auscultated with clear sounds in all 4 fields.  Assisted to reposition sitting up and anxiety meds given per PRN order. Will con't plan of care.  Anecdotally Pt and wife having hard conversation about his drug use, the ways it impacts the care we can provide him here in terms of pain relief, and Pt expressing desire to "go back to the streets."

## 2019-08-21 NOTE — Progress Notes (Signed)
PICC line removed per order and line is intact. Vaseline gauze and gauze dressing applied and pressure held. Site clean, dry, and intact. Patient aware that patient is on bedrest for 30 minutes. Patient aware to leave dressing on and dry for 24 hours.

## 2019-08-22 ENCOUNTER — Other Ambulatory Visit: Payer: Self-pay

## 2019-08-22 ENCOUNTER — Encounter (HOSPITAL_COMMUNITY): Payer: Self-pay | Admitting: Plastic Surgery

## 2019-08-22 NOTE — Progress Notes (Signed)
Patient denies chest pain reports shortness of breath that started ~ 1 week ago and was evaluated while in the hospital for same. Patient educated on visitation policy and need for [id number] test on arrival. Patient reports using heroin yesterday after being discharged from the hospital. Requested that patient avoid illicit substances or nicotine intake before surgery. Below instructions provided regarding DM management. Dr. Roanna Banning notified of same.   How do I manage my blood sugar before surgery? . Check your blood sugar at least 4 times a day, starting 2 days before surgery, to make sure that the level is not too high or low. o Check your blood sugar the morning of your surgery when you wake up and every 2 hours until you get to the Short Stay unit. . If your blood sugar is less than 70 mg/dL, you will need to treat for low blood sugar: o Do not take insulin. o Treat a low blood sugar (less than 70 mg/dL) with  cup of clear juice (cranberry or apple), 4 glucose tablets, OR glucose gel. Recheck blood sugar in 15 minutes after treatment (to make sure it is greater than 70 mg/dL). If your blood sugar is not greater than 70 mg/dL on recheck, call [phone] o  for further instructions. . Report your blood sugar to the short stay nurse when you get to Short Stay.  . If you are admitted to the hospital after surgery: o Your blood sugar will be checked by the staff and you will probably be given insulin after surgery (instead of oral diabetes medicines) to make sure you have good blood sugar levels. o The goal for blood sugar control after surgery is 80-180 mg/dL.   . THE NIGHT BEFORE SURGERY, take ____32_______ units of __Lantus_________insulin.       . THE MORNING OF SURGERY, take ___32__________ units of _Lantus_________insulin.  . If your CBG is greater than 220 mg/dL, you may take  of your sliding scale (correction) dose of insulin.

## 2019-08-24 ENCOUNTER — Inpatient Hospital Stay: Admit: 2019-08-24 | Payer: Medicaid Other | Admitting: Plastic Surgery

## 2019-08-24 ENCOUNTER — Telehealth: Payer: Self-pay

## 2019-08-24 HISTORY — DX: Hypothyroidism, unspecified: E03.9

## 2019-08-24 HISTORY — DX: Type 1 diabetes mellitus without complications: E10.9

## 2019-08-24 SURGERY — IRRIGATION AND DEBRIDEMENT WOUND
Anesthesia: General | Laterality: Bilateral

## 2019-08-24 NOTE — Telephone Encounter (Signed)
Transition Care Management Follow-up Telephone Call  Date of discharge and from where: 08/21/2019, [facility]  - left AMA  How have you been since you were released from the hospital? He stated that he is doing "fine."  He then said that he is still having difficulty breathing and difficulty sleeping. He understands that he left the hospital AMA and may need to return to the hospital as his treatment plan was not completed. This CM stressed the importance of seeking medical care for the shortness of breath,   Any questions or concerns? He needs to inquire why his procedure today was cancelled.   He is also concerned about his breathing, as noted above.   Items Reviewed:  Did the pt receive and understand the discharge instructions provided? no discharge instructions, left AMA  Medications obtained and verified? he said that he has no medications except his insulins.  He needs to call in refills for everything else.  He is requesting test strips for his glucometer.   Any new allergies since your discharge?  none reported  Do you have support at home? Yes, his wife who is an Therapist, sports  Other (ie: DME, Home Health, etc) has glucometer - he said his blood sugars have been in the 200's.    Functional Questionnaire: (I = Independent and D = Dependent) ADL's:independent   Follow up appointments reviewed:    PCP Hospital f/u appt confirmed?appointment with Ms Raul Del, NP 09/09/2019 @ [facility] f/u appt confirmed?he said that he needs to call Dr Claudia Desanctis to inquire why his procedure was cancelled today and reschedule   Are transportation arrangements needed?  no  If their condition worsens, is the pt aware to call  their PCP or go to the ED? yes  Was the patient provided with contact information for the PCP's office or ED?  he has the clinic phone number  Was the pt encouraged to call back with questions or concerns? yes

## 2019-08-25 LAB — CULTURE, BLOOD (ROUTINE X 2)
Culture: NO GROWTH
Culture: NO GROWTH
Special Requests: ADEQUATE
Special Requests: ADEQUATE

## 2019-08-25 NOTE — Discharge Summary (Signed)
Physician Discharge Summary  Luke [name] NWG:[id number] DOB: 21-Nov-1980 DOA: 08/19/2019  PCP: Gildardo Pounds, NP  Admit date: 08/19/2019 Discharge date: 08/25/2019  Admitted From: Home Disposition:  Home        LEFT AMA Recommendations for Outpatient Follow-up:  1.   Home Health:No Equipment/Devices:none  Discharge Condition:Stable CODE STATUS:Full Diet recommendation: Heart Healthy Brief/Interim Summary: 39 y.o. male with medical history significant forIV heroin abuse, insulin-dependent diabetes mellitus, hypothyroidism, chronic kidney disease stage III, and history of endocarditis, now presenting to the emergency department for evaluation of chest pain and shortness of breath. The patient reports developing pleuritic chest pain and shortness of breath approximately 1 week ago, has not been coughing much, has not noted any fevers or chills, and denies lower extremity swelling or tenderness. He has a history of left great toe amputation and reports a nonhealing ulceration at the dorsal aspect of his second toe on the left as well as nonhealing wounds involving the forearms bilaterall  Discharge Diagnoses:  Principal Problem:   Cellulitis Active Problems:   Drug abuse, IV ([name])   CKD (chronic kidney disease) stage 3, GFR 30-59 ml/min   Uncontrolled insulin dependent type 1 diabetes mellitus ([name])   Hypothyroidism   Chest pain   Elevated troponin  Chest pain:  chest x-ray showed no acute findings, EKG showed no signs of ischemia his cardiac biomarkers remain flat no events on telemetry. CT angiogram of chest showed no PE.  Left arm cellulitis with deep ulcer: He was started empirically on IV vancomycin and Zosyn, chest x-ray showed no osteomyelitis plastic surgery was consulted and was recommending surgical intervention. The patient left AGAINST MEDICAL ADVICE despite multiple attempts to try to convince him not to.  IV drug abuse: Counseling.  Chronic kidney disease stage  III a: Creatinine seems to be at baseline.  This PICC line was discontinued upon discharge.  Discharge Instructions   Allergies as of 08/21/2019   No Known Allergies     Medication List    ASK your doctor about these medications   acetaminophen 325 MG tablet Commonly known as: TYLENOL Take 2 tablets (650 mg total) by mouth every 6 (six) hours as needed for mild pain (or Fever >/= 101).   amLODipine 5 MG tablet Commonly known as: NORVASC Take 1 tablet (5 mg total) by mouth daily.   atorvastatin 20 MG tablet Commonly known as: LIPITOR Take 1 tablet (20 mg total) by mouth daily.   carvedilol 12.5 MG tablet Commonly known as: COREG Take 1 tablet (12.5 mg total) by mouth 2 (two) times daily with a meal.   doxycycline 100 MG tablet Commonly known as: VIBRA-TABS Take 1 tablet (100 mg total) by mouth 2 (two) times daily.   DULoxetine 30 MG capsule Commonly known as: CYMBALTA Take 1 capsule (30 mg total) by mouth daily.   ferrous sulfate 325 (65 FE) MG tablet Take 1 tablet (325 mg total) by mouth daily with breakfast.   gabapentin 400 MG capsule Commonly known as: NEURONTIN Take 1 capsule (400 mg total) by mouth 3 (three) times daily.   insulin lispro 100 UNIT/ML KwikPen Commonly known as: HumaLOG KwikPen Inject 0.04-0.2 mLs (4-20 Units total) into the skin 3 (three) times daily. Per sliding scale   Insulin Syringe-Needle U-100 31G X 5/16" 0.5 ML Misc Commonly known as: TRUEplus Insulin Syringe USE AS DIRECTED.   Lantus SoloStar 100 UNIT/ML Solostar Pen Generic drug: insulin glargine INJECT 30 UNITS INTO THE SKIN 2 (TWO) TIMES DAILY.  levothyroxine 50 MCG tablet Commonly known as: SYNTHROID Take 1 tablet (50 mcg total) by mouth daily.   Melatonin 5 MG Chew Chew 5 mg by mouth at bedtime as needed (sleep).   omeprazole 20 MG capsule Commonly known as: PRILOSEC Take 1 capsule (20 mg total) by mouth daily.   polyethylene glycol 17 g packet Commonly known as:  MIRALAX / GLYCOLAX Take 17 g by mouth daily as needed for mild constipation.   sertraline 50 MG tablet Commonly known as: ZOLOFT TAKE 1 TABLET (50 MG TOTAL) BY MOUTH DAILY.   traZODone 50 MG tablet Commonly known as: DESYREL Take 1 tablet (50 mg total) by mouth at bedtime.   True Metrix Meter Devi 1 kit by Does not apply route as directed. Use as directed   TRUEplus Lancets 28G Misc Use as directed       No Known Allergies  Consultations:  Plastic surgery   Procedures/Studies: DG Chest 2 View  Result Date: 08/19/2019 CLINICAL DATA:  Chest pain and shortness of breath EXAM: CHEST - 2 VIEW COMPARISON:  05/22/2019 FINDINGS: Cardiac shadow is within limits. Central line is been removed in the interval. The lungs are well aerated with minimal scarring in the left base. No effusion or infiltrate is seen. No bony abnormality is noted. IMPRESSION: Chronic changes without acute abnormality. Electronically Signed   By: Inez Catalina M.D.   On: 08/19/2019 23:20   DG Forearm Left  Result Date: 08/20/2019 CLINICAL DATA:  Cellulitis, IV drug use EXAM: LEFT FOREARM - 2 VIEW COMPARISON:  None. FINDINGS: There is no evidence of fracture or other focal bone lesions. There is diffuse subcutaneous edema seen along the volar and dorsal surface of the forearm. There is possible subcutaneous emphysema seen along the posterior olecranon with a focal area of ulceration. A IMPRESSION: No acute osseous abnormality. Diffuse subcutaneous edema and possible area of ulceration seen on the posterior forearm. Electronically Signed   By: Prudencio Pair M.D.   On: 08/20/2019 05:21   CT ANGIO CHEST PE W OR WO CONTRAST  Result Date: 08/21/2019 CLINICAL DATA:  Shortness of breath and chest pain. History of IV drug abuse. Evaluate for possible septic emboli. EXAM: CT ANGIOGRAPHY CHEST WITH CONTRAST TECHNIQUE: Multidetector CT imaging of the chest was performed using the standard protocol during bolus administration of  intravenous contrast. Multiplanar CT image reconstructions and MIPs were obtained to evaluate the vascular anatomy. CONTRAST:  78m OMNIPAQUE IOHEXOL 350 MG/ML SOLN COMPARISON:  Chest x-ray from yesterday. FINDINGS: Cardiovascular: The heart is normal in size. No pericardial effusion. The aorta is normal in caliber. No dissection. No atherosclerotic calcifications. Branch vessels are patent. No coronary artery calcifications. Mild pulmonary artery enlargement could suggest pulmonary hypertension. No filling defects to suggest pulmonary embolism. Mediastinum/Nodes: Scattered borderline enlarged mediastinal and hilar lymph nodes which could be reactive/inflammatory. There is also some residual thymic tissue noted in the anterior mediastinum. Enlarged axillary lymph nodes are also noted bilaterally measuring between 12 and 19 mm. The esophagus is grossly normal. Lungs/Pleura: Patchy ill-defined areas of faint ground-glass opacity in both lungs but most significant in the left lower lobe. Findings could reflect asymmetric pulmonary edema but more likely atypical/viral pneumonia. Recommend correlation with COVID status. There are bilateral pleural effusions somewhat atypical with COVID pneumonia. I do not see any findings suspicious for septic emboli and there is no focal airspace consolidation or worrisome pulmonary lesions. Upper Abdomen: No significant upper abdominal findings. Musculoskeletal: No significant bony findings. Mild wedging of some  of the thoracic vertebral bodies and Schmorl's nodes which could suggest Scheuermann's disease. I do not see any findings worrisome for discitis, osteomyelitis or septic arthritis. Review of the MIP images confirms the above findings. IMPRESSION: 1. No CT findings for pulmonary embolism. 2. Mild pulmonary artery enlargement could suggest pulmonary hypertension. 3. Normal thoracic aorta. 4. Patchy ill-defined areas of faint ground-glass opacity in both lungs but most significant  in the left lower lobe. Findings could reflect asymmetric pulmonary edema but more likely atypical/viral pneumonia. Recommend correlation with COVID status. 5. Bilateral pleural effusions, somewhat atypical with COVID pneumonia. 6. Mediastinal, hilar and axillary adenopathy, possibly reactive/inflammatory. 7. No findings worrisome for discitis, osteomyelitis or septic arthritis. Electronically Signed   By: Marijo Sanes M.D.   On: 08/21/2019 13:41   DG CHEST PORT 1 VIEW  Result Date: 08/20/2019 CLINICAL DATA:  Short of breath, central and left-sided chest pain for several hours EXAM: PORTABLE CHEST 1 VIEW COMPARISON:  08/19/2019 FINDINGS: Single frontal view of the chest demonstrates stable cardiac silhouette. Since the prior exam, bilateral perihilar airspace disease has developed. No significant effusion or pneumothorax. No acute bony abnormalities. IMPRESSION: 1. Bilateral perihilar airspace disease most consistent with pulmonary edema given rapid development since yesterday. Electronically Signed   By: Randa Ngo M.D.   On: 08/20/2019 23:25   [place] EKG SITE RITE  Result Date: 08/20/2019 If Site Rite image not attached, placement could not be confirmed due to current cardiac rhythm.     Subjective: No new complaints.  Discharge Exam: Vitals:   08/21/19 0803 08/21/19 1520  BP: 117/68 (!) 159/82  Pulse: 94   Resp: (!) 22 20  Temp: 98.4 F (36.9 C) 98.2 F (36.8 C)  SpO2: 96% 99%   Vitals:   08/20/19 2339 08/21/19 0424 08/21/19 0803 08/21/19 1520  BP: 120/73 (!) 110/56 117/68 (!) 159/82  Pulse: 92  94   Resp: (!) 21  (!) 22 20  Temp: 99.1 F (37.3 C) 99.9 F (37.7 C) 98.4 F (36.9 C) 98.2 F (36.8 C)  TempSrc: Oral Oral Oral Oral  SpO2: 97%  96% 99%  Weight:      Height:        General: Pt is alert, awake, not in acute distress Cardiovascular: RRR, S1/S2 +, no rubs, no gallops Respiratory: CTA bilaterally, no wheezing, no rhonchi Abdominal: Soft, NT, ND, bowel sounds  + Extremities: no edema, no cyanosis    The results of significant diagnostics from this hospitalization (including imaging, microbiology, ancillary and laboratory) are listed below for reference.     Microbiology: Recent Results (from the past 240 hour(s))  Culture, blood (Routine x 2)     Status: None   Collection Time: 08/19/19 10:55 PM   Specimen: BLOOD  Result Value Ref Range Status   Specimen Description BLOOD RIGHT ARM  Final   Special Requests   Final    BOTTLES DRAWN AEROBIC AND ANAEROBIC Blood Culture adequate volume   Culture   Final    NO GROWTH 5 DAYS Performed at [facility], [address] [place], [place] [address]    Report Status 08/25/2019 FINAL  Final  Culture, blood (Routine x 2)     Status: None   Collection Time: 08/19/19 11:09 PM   Specimen: BLOOD  Result Value Ref Range Status   Specimen Description BLOOD LEFT ARM  Final   Special Requests   Final    BOTTLES DRAWN AEROBIC ONLY Blood Culture adequate volume   Culture  Final    NO GROWTH 5 DAYS Performed at [facility], [place] [address] [place], [place] [address]    Report Status 08/25/2019 FINAL  Final  SARS Coronavirus 2 by RT PCR (hospital order, performed in [facility] hospital lab) Nasopharyngeal Nasopharyngeal Swab     Status: None   Collection Time: 08/20/19  9:10 AM   Specimen: Nasopharyngeal Swab  Result Value Ref Range Status   SARS Coronavirus 2 NEGATIVE NEGATIVE Final    Comment: (NOTE) SARS-CoV-2 target nucleic acids are NOT DETECTED. The SARS-CoV-2 RNA is generally detectable in upper and lower respiratory specimens during the acute phase of infection. The lowest concentration of SARS-CoV-2 viral copies this assay can detect is 250 copies / mL. A negative result does not preclude SARS-CoV-2 infection and should not be used as the sole basis for treatment or other patient management decisions.  A negative result may occur with improper specimen collection / handling,  submission of specimen other than nasopharyngeal swab, presence of viral mutation(s) within the areas targeted by this assay, and inadequate number of viral copies (<250 copies / mL). A negative result must be combined with clinical observations, patient history, and epidemiological information. Fact Sheet for Patients:   StrictlyIdeas.no Fact Sheet for Healthcare Providers: BankingDealers.co.za This test is not yet approved or cleared  by the [place] FDA and has been authorized for detection and/or diagnosis of SARS-CoV-2 by FDA under an Emergency Use Authorization (EUA).  This EUA will remain in effect (meaning this test can be used) for the duration of the COVID-19 declaration under Section 564(b)(1) of the Act, 21 U.S.C. section 360bbb-3(b)(1), unless the authorization is terminated or revoked sooner. Performed at [facility], [place] [address], [place], [place] [address]      Labs: BNP (last 3 results) No results for input(s): BNP in the last 8760 hours. Basic Metabolic Panel: Recent Labs  Lab 08/19/19 2309 08/21/19 0345  NA 137 136  K 3.4* 4.0  CL 99 102  CO2 24 24  GLUCOSE 274* 251*  BUN 13 15  CREATININE 1.54* 1.48*  CALCIUM 9.1 8.5*  MG  --  1.7   Liver Function Tests: No results for input(s): AST, ALT, ALKPHOS, BILITOT, PROT, ALBUMIN in the last 168 hours. No results for input(s): LIPASE, AMYLASE in the last 168 hours. No results for input(s): AMMONIA in the last 168 hours. CBC: Recent Labs  Lab 08/19/19 2309 08/21/19 0345  WBC 14.1* 14.2*  NEUTROABS  --  9.3*  HGB 10.0* 9.5*  HCT 32.0* 29.1*  MCV 82.3 79.7*  PLT 574* 528*   Cardiac Enzymes: No results for input(s): CKTOTAL, CKMB, CKMBINDEX, TROPONINI in the last 168 hours. BNP: Invalid input(s): POCBNP CBG: Recent Labs  Lab 08/20/19 2129 08/20/19 2211 08/21/19 0619 08/21/19 1226 08/21/19 1719  GLUCAP 55* 85 266* 208* 372*    D-Dimer No results for input(s): DDIMER in the last 72 hours. Hgb A1c No results for input(s): HGBA1C in the last 72 hours. Lipid Profile No results for input(s): CHOL, HDL, LDLCALC, TRIG, CHOLHDL, LDLDIRECT in the last 72 hours. Thyroid function studies No results for input(s): TSH, T4TOTAL, T3FREE, THYROIDAB in the last 72 hours.  Invalid input(s): FREET3 Anemia work up No results for input(s): VITAMINB12, FOLATE, FERRITIN, TIBC, IRON, RETICCTPCT in the last 72 hours. Urinalysis    Component Value Date/Time   COLORURINE YELLOW 08/20/2019 0504   APPEARANCEUR CLEAR 08/20/2019 0504   APPEARANCEUR Clear 04/11/2014 0138   LABSPEC 1.026  08/20/2019 0504   LABSPEC 1.026 04/11/2014 0138   PHURINE 6.0 08/20/2019 0504   GLUCOSEU >=500 (A) 08/20/2019 0504   GLUCOSEU >=500 04/11/2014 0138   HGBUR SMALL (A) 08/20/2019 0504   BILIRUBINUR NEGATIVE 08/20/2019 0504   BILIRUBINUR negative 03/24/2018 1032   BILIRUBINUR neg 02/18/2017 0946   BILIRUBINUR Negative 04/11/2014 0138   KETONESUR NEGATIVE 08/20/2019 0504   PROTEINUR >=300 (A) 08/20/2019 0504   UROBILINOGEN 0.2 03/24/2018 1032   UROBILINOGEN 0.2 12/10/2013 1221   NITRITE NEGATIVE 08/20/2019 0504   LEUKOCYTESUR NEGATIVE 08/20/2019 0504   LEUKOCYTESUR Negative 04/11/2014 0138   Sepsis Labs Invalid input(s): PROCALCITONIN,  WBC,  LACTICIDVEN Microbiology Recent Results (from the past 240 hour(s))  Culture, blood (Routine x 2)     Status: None   Collection Time: 08/19/19 10:55 PM   Specimen: BLOOD  Result Value Ref Range Status   Specimen Description BLOOD RIGHT ARM  Final   Special Requests   Final    BOTTLES DRAWN AEROBIC AND ANAEROBIC Blood Culture adequate volume   Culture   Final    NO GROWTH 5 DAYS Performed at [facility], [place] [address] [place], [place] [address]    Report Status 08/25/2019 FINAL  Final  Culture, blood (Routine x 2)     Status: None   Collection Time: 08/19/19 11:09 PM   Specimen: BLOOD   Result Value Ref Range Status   Specimen Description BLOOD LEFT ARM  Final   Special Requests   Final    BOTTLES DRAWN AEROBIC ONLY Blood Culture adequate volume   Culture   Final    NO GROWTH 5 DAYS Performed at [facility], [address], [place], [place] [address]    Report Status 08/25/2019 FINAL  Final  SARS Coronavirus 2 by RT PCR (hospital order, performed in [facility] hospital lab) Nasopharyngeal Nasopharyngeal Swab     Status: None   Collection Time: 08/20/19  9:10 AM   Specimen: Nasopharyngeal Swab  Result Value Ref Range Status   SARS Coronavirus 2 NEGATIVE NEGATIVE Final    Comment: (NOTE) SARS-CoV-2 target nucleic acids are NOT DETECTED. The SARS-CoV-2 RNA is generally detectable in upper and lower respiratory specimens during the acute phase of infection. The lowest concentration of SARS-CoV-2 viral copies this assay can detect is 250 copies / mL. A negative result does not preclude SARS-CoV-2 infection and should not be used as the sole basis for treatment or other patient management decisions.  A negative result may occur with improper specimen collection / handling, submission of specimen other than nasopharyngeal swab, presence of viral mutation(s) within the areas targeted by this assay, and inadequate number of viral copies (<250 copies / mL). A negative result must be combined with clinical observations, patient history, and epidemiological information. Fact Sheet for Patients:   StrictlyIdeas.no Fact Sheet for Healthcare Providers: BankingDealers.co.za This test is not yet approved or cleared  by the [place] FDA and has been authorized for detection and/or diagnosis of SARS-CoV-2 by FDA under an Emergency Use Authorization (EUA).  This EUA will remain in effect (meaning this test can be used) for the duration of the COVID-19 declaration under Section 564(b)(1) of the Act, 21 U.S.C. section  360bbb-3(b)(1), unless the authorization is terminated or revoked sooner. Performed at [facility], [place] [address], [place], [place] [address]      Time coordinating discharge: Over 30 minutes  SIGNED:   Charlynne Cousins, MD  [facility] 08/25/2019, 10:20  AM Pager   If 7PM-7AM, please contact night-coverage [email or web address] Password [email or web address]

## 2019-08-27 ENCOUNTER — Encounter (HOSPITAL_COMMUNITY): Payer: Self-pay

## 2019-08-27 ENCOUNTER — Other Ambulatory Visit: Payer: Self-pay

## 2019-08-27 ENCOUNTER — Emergency Department (HOSPITAL_COMMUNITY): Payer: Medicaid Other

## 2019-08-27 ENCOUNTER — Inpatient Hospital Stay (HOSPITAL_COMMUNITY)
Admission: EM | Admit: 2019-08-27 | Discharge: 2019-09-10 | DRG: 264 | Disposition: A | Discharge status: Home / Self Care | Payer: Medicaid Other | Attending: Internal Medicine | Admitting: Internal Medicine

## 2019-08-27 DIAGNOSIS — E785 Hyperlipidemia, unspecified: Secondary | ICD-10-CM | POA: Diagnosis present

## 2019-08-27 DIAGNOSIS — N1831 Chronic kidney disease, stage 3a: Secondary | ICD-10-CM | POA: Diagnosis present

## 2019-08-27 DIAGNOSIS — Z8249 Family history of ischemic heart disease and other diseases of the circulatory system: Secondary | ICD-10-CM

## 2019-08-27 DIAGNOSIS — R0602 Shortness of breath: Secondary | ICD-10-CM

## 2019-08-27 DIAGNOSIS — L97529 Non-pressure chronic ulcer of other part of left foot with unspecified severity: Secondary | ICD-10-CM | POA: Diagnosis present

## 2019-08-27 DIAGNOSIS — E109 Type 1 diabetes mellitus without complications: Secondary | ICD-10-CM | POA: Diagnosis not present

## 2019-08-27 DIAGNOSIS — E10621 Type 1 diabetes mellitus with foot ulcer: Secondary | ICD-10-CM | POA: Diagnosis present

## 2019-08-27 DIAGNOSIS — Z20822 Contact with and (suspected) exposure to covid-19: Secondary | ICD-10-CM | POA: Diagnosis present

## 2019-08-27 DIAGNOSIS — I330 Acute and subacute infective endocarditis: Principal | ICD-10-CM | POA: Diagnosis present

## 2019-08-27 DIAGNOSIS — I509 Heart failure, unspecified: Secondary | ICD-10-CM

## 2019-08-27 DIAGNOSIS — I351 Nonrheumatic aortic (valve) insufficiency: Secondary | ICD-10-CM | POA: Diagnosis not present

## 2019-08-27 DIAGNOSIS — S41109A Unspecified open wound of unspecified upper arm, initial encounter: Secondary | ICD-10-CM | POA: Diagnosis present

## 2019-08-27 DIAGNOSIS — E1065 Type 1 diabetes mellitus with hyperglycemia: Secondary | ICD-10-CM | POA: Diagnosis present

## 2019-08-27 DIAGNOSIS — R569 Unspecified convulsions: Secondary | ICD-10-CM | POA: Diagnosis present

## 2019-08-27 DIAGNOSIS — Z720 Tobacco use: Secondary | ICD-10-CM | POA: Diagnosis present

## 2019-08-27 DIAGNOSIS — I96 Gangrene, not elsewhere classified: Secondary | ICD-10-CM | POA: Diagnosis not present

## 2019-08-27 DIAGNOSIS — F1110 Opioid abuse, uncomplicated: Secondary | ICD-10-CM | POA: Diagnosis present

## 2019-08-27 DIAGNOSIS — E10649 Type 1 diabetes mellitus with hypoglycemia without coma: Secondary | ICD-10-CM | POA: Diagnosis present

## 2019-08-27 DIAGNOSIS — F1910 Other psychoactive substance abuse, uncomplicated: Secondary | ICD-10-CM | POA: Diagnosis present

## 2019-08-27 DIAGNOSIS — I5020 Unspecified systolic (congestive) heart failure: Secondary | ICD-10-CM | POA: Diagnosis present

## 2019-08-27 DIAGNOSIS — Z818 Family history of other mental and behavioral disorders: Secondary | ICD-10-CM

## 2019-08-27 DIAGNOSIS — I058 Other rheumatic mitral valve diseases: Secondary | ICD-10-CM | POA: Diagnosis not present

## 2019-08-27 DIAGNOSIS — I5021 Acute systolic (congestive) heart failure: Secondary | ICD-10-CM | POA: Diagnosis not present

## 2019-08-27 DIAGNOSIS — I130 Hypertensive heart and chronic kidney disease with heart failure and stage 1 through stage 4 chronic kidney disease, or unspecified chronic kidney disease: Secondary | ICD-10-CM | POA: Diagnosis present

## 2019-08-27 DIAGNOSIS — N183 Chronic kidney disease, stage 3 unspecified: Secondary | ICD-10-CM | POA: Diagnosis present

## 2019-08-27 DIAGNOSIS — Z79890 Hormone replacement therapy (postmenopausal): Secondary | ICD-10-CM

## 2019-08-27 DIAGNOSIS — B1920 Unspecified viral hepatitis C without hepatic coma: Secondary | ICD-10-CM | POA: Diagnosis present

## 2019-08-27 DIAGNOSIS — F1120 Opioid dependence, uncomplicated: Secondary | ICD-10-CM | POA: Diagnosis present

## 2019-08-27 DIAGNOSIS — I378 Other nonrheumatic pulmonary valve disorders: Secondary | ICD-10-CM | POA: Diagnosis not present

## 2019-08-27 DIAGNOSIS — I38 Endocarditis, valve unspecified: Secondary | ICD-10-CM | POA: Diagnosis present

## 2019-08-27 DIAGNOSIS — E1022 Type 1 diabetes mellitus with diabetic chronic kidney disease: Secondary | ICD-10-CM | POA: Diagnosis present

## 2019-08-27 DIAGNOSIS — Z79899 Other long term (current) drug therapy: Secondary | ICD-10-CM

## 2019-08-27 DIAGNOSIS — D649 Anemia, unspecified: Secondary | ICD-10-CM | POA: Diagnosis present

## 2019-08-27 DIAGNOSIS — F17210 Nicotine dependence, cigarettes, uncomplicated: Secondary | ICD-10-CM | POA: Diagnosis present

## 2019-08-27 DIAGNOSIS — E1052 Type 1 diabetes mellitus with diabetic peripheral angiopathy with gangrene: Secondary | ICD-10-CM | POA: Diagnosis present

## 2019-08-27 DIAGNOSIS — L02413 Cutaneous abscess of right upper limb: Secondary | ICD-10-CM | POA: Diagnosis not present

## 2019-08-27 DIAGNOSIS — E039 Hypothyroidism, unspecified: Secondary | ICD-10-CM | POA: Diagnosis present

## 2019-08-27 DIAGNOSIS — Z8679 Personal history of other diseases of the circulatory system: Secondary | ICD-10-CM

## 2019-08-27 DIAGNOSIS — I080 Rheumatic disorders of both mitral and aortic valves: Secondary | ICD-10-CM | POA: Diagnosis present

## 2019-08-27 DIAGNOSIS — I059 Rheumatic mitral valve disease, unspecified: Secondary | ICD-10-CM | POA: Diagnosis present

## 2019-08-27 DIAGNOSIS — Z89412 Acquired absence of left great toe: Secondary | ICD-10-CM | POA: Diagnosis not present

## 2019-08-27 DIAGNOSIS — Z794 Long term (current) use of insulin: Secondary | ICD-10-CM

## 2019-08-27 DIAGNOSIS — L89892 Pressure ulcer of other site, stage 2: Secondary | ICD-10-CM | POA: Diagnosis present

## 2019-08-27 DIAGNOSIS — F1994 Other psychoactive substance use, unspecified with psychoactive substance-induced mood disorder: Secondary | ICD-10-CM | POA: Diagnosis present

## 2019-08-27 DIAGNOSIS — K219 Gastro-esophageal reflux disease without esophagitis: Secondary | ICD-10-CM | POA: Diagnosis present

## 2019-08-27 DIAGNOSIS — I340 Nonrheumatic mitral (valve) insufficiency: Secondary | ICD-10-CM | POA: Diagnosis not present

## 2019-08-27 DIAGNOSIS — F1490 Cocaine use, unspecified, uncomplicated: Secondary | ICD-10-CM | POA: Diagnosis present

## 2019-08-27 DIAGNOSIS — Z833 Family history of diabetes mellitus: Secondary | ICD-10-CM

## 2019-08-27 DIAGNOSIS — E11621 Type 2 diabetes mellitus with foot ulcer: Secondary | ICD-10-CM | POA: Diagnosis present

## 2019-08-27 HISTORY — DX: Cardiac murmur, unspecified: R01.1

## 2019-08-27 HISTORY — DX: Methicillin resistant Staphylococcus aureus infection, unspecified site: A49.02

## 2019-08-27 HISTORY — DX: Sepsis, unspecified organism: A41.9

## 2019-08-27 HISTORY — DX: Inflammatory liver disease, unspecified: K75.9

## 2019-08-27 HISTORY — DX: Cellulitis, unspecified: L03.90

## 2019-08-27 LAB — CBC WITH DIFFERENTIAL/PLATELET
Abs Immature Granulocytes: 0.04 10*3/uL (ref 0.00–0.07)
Basophils Absolute: 0.1 10*3/uL (ref 0.0–0.1)
Basophils Relative: 1 %
Eosinophils Absolute: 0.2 10*3/uL (ref 0.0–0.5)
Eosinophils Relative: 2 %
HCT: 27.9 % — ABNORMAL LOW (ref 39.0–52.0)
Hemoglobin: 8.5 g/dL — ABNORMAL LOW (ref 13.0–17.0)
Immature Granulocytes: 0 %
Lymphocytes Relative: 20 %
Lymphs Abs: 2.1 10*3/uL (ref 0.7–4.0)
MCH: 25.7 pg — ABNORMAL LOW (ref 26.0–34.0)
MCHC: 30.5 g/dL (ref 30.0–36.0)
MCV: 84.3 fL (ref 80.0–100.0)
Monocytes Absolute: 0.7 10*3/uL (ref 0.1–1.0)
Monocytes Relative: 7 %
Neutro Abs: 7.3 10*3/uL (ref 1.7–7.7)
Neutrophils Relative %: 70 %
Platelets: 505 10*3/uL — ABNORMAL HIGH (ref 150–400)
RBC: 3.31 MIL/uL — ABNORMAL LOW (ref 4.22–5.81)
RDW: 14 % (ref 11.5–15.5)
WBC: 10.4 10*3/uL (ref 4.0–10.5)
nRBC: 0 % (ref 0.0–0.2)

## 2019-08-27 LAB — SEDIMENTATION RATE: Sed Rate: 52 mm/hr — ABNORMAL HIGH (ref 0–16)

## 2019-08-27 LAB — COMPREHENSIVE METABOLIC PANEL
ALT: 14 U/L (ref 0–44)
AST: 16 U/L (ref 15–41)
Albumin: 2.6 g/dL — ABNORMAL LOW (ref 3.5–5.0)
Alkaline Phosphatase: 103 U/L (ref 38–126)
Anion gap: 9 (ref 5–15)
BUN: 13 mg/dL (ref 6–20)
CO2: 22 mmol/L (ref 22–32)
Calcium: 8.5 mg/dL — ABNORMAL LOW (ref 8.9–10.3)
Chloride: 105 mmol/L (ref 98–111)
Creatinine, Ser: 1.46 mg/dL — ABNORMAL HIGH (ref 0.61–1.24)
GFR calc Af Amer: >60 mL/min (ref >60)
GFR calc non Af Amer: 60 mL/min — ABNORMAL LOW (ref >60)
Glucose, Bld: 405 mg/dL — ABNORMAL HIGH (ref 70–99)
Potassium: 4.3 mmol/L (ref 3.5–5.1)
Sodium: 136 mmol/L (ref 135–145)
Total Bilirubin: 0.7 mg/dL (ref 0.3–1.2)
Total Protein: 6.3 g/dL — ABNORMAL LOW (ref 6.5–8.1)

## 2019-08-27 LAB — RAPID URINE DRUG SCREEN, HOSP PERFORMED
Amphetamines: NOT DETECTED
Barbiturates: NOT DETECTED
Benzodiazepines: NOT DETECTED
Cocaine: POSITIVE — AB
Opiates: POSITIVE — AB
Tetrahydrocannabinol: NOT DETECTED

## 2019-08-27 LAB — HEMOGLOBIN A1C
Hgb A1c MFr Bld: 10.8 % — ABNORMAL HIGH (ref 4.8–5.6)
Mean Plasma Glucose: 263.26 mg/dL

## 2019-08-27 LAB — SARS CORONAVIRUS 2 BY RT PCR (HOSPITAL ORDER, PERFORMED IN ~~LOC~~ HOSPITAL LAB): SARS Coronavirus 2: NEGATIVE

## 2019-08-27 LAB — TROPONIN I (HIGH SENSITIVITY)
Troponin I (High Sensitivity): 25 ng/L — ABNORMAL HIGH (ref <18)
Troponin I (High Sensitivity): 29 ng/L — ABNORMAL HIGH (ref <18)

## 2019-08-27 LAB — PREALBUMIN: Prealbumin: 15.7 mg/dL — ABNORMAL LOW (ref 18–38)

## 2019-08-27 LAB — BRAIN NATRIURETIC PEPTIDE: B Natriuretic Peptide: 257.6 pg/mL — ABNORMAL HIGH (ref 0.0–100.0)

## 2019-08-27 LAB — GLUCOSE, CAPILLARY: Glucose-Capillary: 360 mg/dL — ABNORMAL HIGH (ref 70–99)

## 2019-08-27 LAB — C-REACTIVE PROTEIN: CRP: 1.5 mg/dL — ABNORMAL HIGH (ref <1.0)

## 2019-08-27 LAB — D-DIMER, QUANTITATIVE: D-Dimer, Quant: 2.88 ug/mL-FEU — ABNORMAL HIGH (ref 0.00–0.50)

## 2019-08-27 LAB — HIV ANTIBODY (ROUTINE TESTING W REFLEX): HIV Screen 4th Generation wRfx: NONREACTIVE

## 2019-08-27 MED ORDER — TRAZODONE HCL 50 MG PO TABS
50.0000 mg | ORAL_TABLET | Freq: Every day | ORAL | Status: DC
  Administered 2019-08-27 – 2019-09-09 (×14): 50 mg via ORAL
  Filled 2019-08-27 (×14): qty 1

## 2019-08-27 MED ORDER — CARVEDILOL 12.5 MG PO TABS
12.5000 mg | ORAL_TABLET | Freq: Two times a day (BID) | ORAL | Status: DC
  Administered 2019-08-28 – 2019-09-07 (×20): 12.5 mg via ORAL
  Filled 2019-08-27 (×21): qty 1

## 2019-08-27 MED ORDER — ATORVASTATIN CALCIUM 10 MG PO TABS
20.0000 mg | ORAL_TABLET | Freq: Every day | ORAL | Status: DC
  Administered 2019-08-28 – 2019-09-10 (×13): 20 mg via ORAL
  Filled 2019-08-27 (×14): qty 2

## 2019-08-27 MED ORDER — GABAPENTIN 400 MG PO CAPS
400.0000 mg | ORAL_CAPSULE | Freq: Three times a day (TID) | ORAL | Status: DC
  Administered 2019-08-27 – 2019-09-10 (×41): 400 mg via ORAL
  Filled 2019-08-27 (×41): qty 1

## 2019-08-27 MED ORDER — VANCOMYCIN HCL 1500 MG/300ML IV SOLN
1500.0000 mg | Freq: Once | INTRAVENOUS | Status: AC
  Administered 2019-08-27: 1500 mg via INTRAVENOUS
  Filled 2019-08-27: qty 300

## 2019-08-27 MED ORDER — FUROSEMIDE 10 MG/ML IJ SOLN
40.0000 mg | Freq: Once | INTRAMUSCULAR | Status: AC
  Administered 2019-08-27: 40 mg via INTRAVENOUS
  Filled 2019-08-27: qty 4

## 2019-08-27 MED ORDER — DICYCLOMINE HCL 20 MG PO TABS
20.0000 mg | ORAL_TABLET | Freq: Four times a day (QID) | ORAL | Status: AC | PRN
  Filled 2019-08-27: qty 1

## 2019-08-27 MED ORDER — SODIUM CHLORIDE 0.9% FLUSH
10.0000 mL | INTRAVENOUS | Status: DC | PRN
  Administered 2019-08-28 – 2019-09-08 (×2): 10 mL

## 2019-08-27 MED ORDER — ONDANSETRON HCL 4 MG PO TABS: 4.0000 mg | ORAL_TABLET | Freq: Four times a day (QID) | ORAL | Status: DC | PRN

## 2019-08-27 MED ORDER — MELATONIN 5 MG PO TABS
5.0000 mg | ORAL_TABLET | Freq: Every evening | ORAL | Status: DC | PRN
  Administered 2019-08-27 – 2019-09-08 (×9): 5 mg via ORAL
  Filled 2019-08-27 (×10): qty 1

## 2019-08-27 MED ORDER — ACETAMINOPHEN 650 MG RE SUPP: 650.0000 mg | Freq: Four times a day (QID) | RECTAL | Status: DC | PRN

## 2019-08-27 MED ORDER — NAPROXEN 250 MG PO TABS
500.0000 mg | ORAL_TABLET | Freq: Two times a day (BID) | ORAL | Status: DC | PRN
  Administered 2019-08-29: 500 mg via ORAL
  Filled 2019-08-27 (×2): qty 2

## 2019-08-27 MED ORDER — SERTRALINE HCL 50 MG PO TABS
50.0000 mg | ORAL_TABLET | Freq: Every day | ORAL | Status: DC
  Administered 2019-08-28 – 2019-09-10 (×13): 50 mg via ORAL
  Filled 2019-08-27 (×12): qty 1

## 2019-08-27 MED ORDER — INSULIN ASPART 100 UNIT/ML ~~LOC~~ SOLN
0.0000 [IU] | Freq: Every day | SUBCUTANEOUS | Status: DC
  Administered 2019-08-29: 5 [IU] via SUBCUTANEOUS
  Administered 2019-08-30: 3 [IU] via SUBCUTANEOUS
  Administered 2019-09-05: 2 [IU] via SUBCUTANEOUS
  Administered 2019-09-08: 4 [IU] via SUBCUTANEOUS

## 2019-08-27 MED ORDER — NICOTINE 14 MG/24HR TD PT24
14.0000 mg | MEDICATED_PATCH | Freq: Every day | TRANSDERMAL | Status: DC
  Administered 2019-09-10: 14 mg via TRANSDERMAL
  Filled 2019-08-27 (×9): qty 1

## 2019-08-27 MED ORDER — DULOXETINE HCL 30 MG PO CPEP
30.0000 mg | ORAL_CAPSULE | Freq: Every day | ORAL | Status: DC
  Administered 2019-08-28 – 2019-09-10 (×13): 30 mg via ORAL
  Filled 2019-08-27 (×13): qty 1

## 2019-08-27 MED ORDER — INSULIN GLARGINE 100 UNIT/ML ~~LOC~~ SOLN
40.0000 [IU] | Freq: Every day | SUBCUTANEOUS | Status: DC
  Administered 2019-08-27: 40 [IU] via SUBCUTANEOUS
  Filled 2019-08-27 (×2): qty 0.4

## 2019-08-27 MED ORDER — CLONIDINE HCL 0.1 MG PO TABS
0.1000 mg | ORAL_TABLET | Freq: Four times a day (QID) | ORAL | Status: AC
  Administered 2019-08-27 – 2019-08-30 (×9): 0.1 mg via ORAL
  Filled 2019-08-27 (×10): qty 1

## 2019-08-27 MED ORDER — ONDANSETRON 4 MG PO TBDP
4.0000 mg | ORAL_TABLET | Freq: Four times a day (QID) | ORAL | Status: AC | PRN
  Filled 2019-08-27: qty 1

## 2019-08-27 MED ORDER — AMLODIPINE BESYLATE 5 MG PO TABS
5.0000 mg | ORAL_TABLET | Freq: Every day | ORAL | Status: DC
  Administered 2019-08-28 – 2019-09-05 (×8): 5 mg via ORAL
  Filled 2019-08-27 (×8): qty 1

## 2019-08-27 MED ORDER — CLONIDINE HCL 0.1 MG PO TABS
0.1000 mg | ORAL_TABLET | Freq: Every day | ORAL | Status: AC
  Filled 2019-08-27 (×2): qty 1

## 2019-08-27 MED ORDER — FUROSEMIDE 20 MG PO TABS
40.0000 mg | ORAL_TABLET | Freq: Once | ORAL | Status: AC
  Administered 2019-08-27: 40 mg via ORAL
  Filled 2019-08-27: qty 2

## 2019-08-27 MED ORDER — DOCUSATE SODIUM 100 MG PO CAPS
100.0000 mg | ORAL_CAPSULE | Freq: Two times a day (BID) | ORAL | Status: DC
  Administered 2019-08-28 – 2019-09-10 (×24): 100 mg via ORAL
  Filled 2019-08-27 (×27): qty 1

## 2019-08-27 MED ORDER — PANTOPRAZOLE SODIUM 40 MG PO TBEC
40.0000 mg | DELAYED_RELEASE_TABLET | Freq: Every day | ORAL | Status: DC
  Administered 2019-08-28 – 2019-09-10 (×13): 40 mg via ORAL
  Filled 2019-08-27 (×11): qty 1

## 2019-08-27 MED ORDER — METHOCARBAMOL 500 MG PO TABS
500.0000 mg | ORAL_TABLET | Freq: Three times a day (TID) | ORAL | Status: AC | PRN
  Administered 2019-08-28 – 2019-08-31 (×8): 500 mg via ORAL
  Filled 2019-08-27 (×9): qty 1

## 2019-08-27 MED ORDER — HYDRALAZINE HCL 20 MG/ML IJ SOLN: 5.0000 mg | INTRAMUSCULAR | Status: DC | PRN

## 2019-08-27 MED ORDER — SODIUM CHLORIDE 0.9 % IV SOLN
2.0000 g | Freq: Three times a day (TID) | INTRAVENOUS | Status: DC
  Administered 2019-08-27 – 2019-09-04 (×23): 2 g via INTRAVENOUS
  Filled 2019-08-27 (×25): qty 2

## 2019-08-27 MED ORDER — MORPHINE SULFATE (PF) 2 MG/ML IV SOLN: 2.0000 mg | Freq: Once | INTRAVENOUS | Status: DC

## 2019-08-27 MED ORDER — INSULIN ASPART 100 UNIT/ML ~~LOC~~ SOLN
0.0000 [IU] | Freq: Three times a day (TID) | SUBCUTANEOUS | Status: DC
  Administered 2019-08-28: 5 [IU] via SUBCUTANEOUS
  Administered 2019-08-28: 11 [IU] via SUBCUTANEOUS
  Administered 2019-08-29 – 2019-08-30 (×4): 5 [IU] via SUBCUTANEOUS
  Administered 2019-08-31: 2 [IU] via SUBCUTANEOUS
  Administered 2019-09-02: 3 [IU] via SUBCUTANEOUS
  Administered 2019-09-03: 5 [IU] via SUBCUTANEOUS

## 2019-08-27 MED ORDER — LOPERAMIDE HCL 2 MG PO CAPS: 2.0000 mg | ORAL_CAPSULE | ORAL | Status: AC | PRN

## 2019-08-27 MED ORDER — MORPHINE SULFATE (PF) 2 MG/ML IV SOLN
2.0000 mg | INTRAVENOUS | Status: DC | PRN
  Administered 2019-08-27 – 2019-09-01 (×34): 2 mg via INTRAVENOUS
  Filled 2019-08-27 (×37): qty 1

## 2019-08-27 MED ORDER — LACTATED RINGERS IV SOLN: INTRAVENOUS | Status: DC

## 2019-08-27 MED ORDER — HYDROXYZINE HCL 25 MG PO TABS
25.0000 mg | ORAL_TABLET | Freq: Four times a day (QID) | ORAL | Status: AC | PRN
  Administered 2019-08-28 – 2019-08-30 (×5): 25 mg via ORAL
  Filled 2019-08-27 (×6): qty 1

## 2019-08-27 MED ORDER — SODIUM CHLORIDE 0.9% FLUSH
10.0000 mL | Freq: Two times a day (BID) | INTRAVENOUS | Status: DC
  Administered 2019-08-28: 20 mL
  Administered 2019-08-28 – 2019-09-10 (×17): 10 mL

## 2019-08-27 MED ORDER — SODIUM CHLORIDE 0.9% FLUSH
3.0000 mL | Freq: Two times a day (BID) | INTRAVENOUS | Status: DC
  Administered 2019-08-28 – 2019-09-10 (×14): 3 mL via INTRAVENOUS

## 2019-08-27 MED ORDER — ACETAMINOPHEN 325 MG PO TABS: 650.0000 mg | ORAL_TABLET | Freq: Four times a day (QID) | ORAL | Status: DC | PRN

## 2019-08-27 MED ORDER — CLONIDINE HCL 0.1 MG PO TABS
0.1000 mg | ORAL_TABLET | ORAL | Status: AC
  Administered 2019-08-30 – 2019-08-31 (×4): 0.1 mg via ORAL
  Filled 2019-08-27 (×3): qty 1

## 2019-08-27 MED ORDER — ONDANSETRON HCL 4 MG/2ML IJ SOLN: 4.0000 mg | Freq: Four times a day (QID) | INTRAMUSCULAR | Status: DC | PRN

## 2019-08-27 MED ORDER — LEVOTHYROXINE SODIUM 50 MCG PO TABS
50.0000 ug | ORAL_TABLET | Freq: Every day | ORAL | Status: DC
  Administered 2019-08-28 – 2019-09-10 (×14): 50 ug via ORAL
  Filled 2019-08-27 (×14): qty 1

## 2019-08-27 MED ORDER — VANCOMYCIN HCL IN DEXTROSE 1-5 GM/200ML-% IV SOLN
1000.0000 mg | Freq: Two times a day (BID) | INTRAVENOUS | Status: DC
  Administered 2019-08-28 – 2019-08-30 (×5): 1000 mg via INTRAVENOUS
  Filled 2019-08-27 (×7): qty 200

## 2019-08-27 MED ORDER — POLYETHYLENE GLYCOL 3350 17 G PO PACK: 17.0000 g | PACK | Freq: Every day | ORAL | Status: DC | PRN

## 2019-08-27 MED ORDER — MORPHINE SULFATE (PF) 2 MG/ML IV SOLN
2.0000 mg | Freq: Once | INTRAVENOUS | Status: AC
  Administered 2019-08-27: 2 mg via INTRAMUSCULAR
  Filled 2019-08-27: qty 1

## 2019-08-27 NOTE — ED Notes (Signed)
IV team at bedside 

## 2019-08-27 NOTE — ED Provider Notes (Signed)
[facility] EMERGENCY DEPARTMENT Provider Note   CSN: [id number] Arrival date & time: 08/27/19  1241     History Chief Complaint  Patient presents with   Shortness of Breath    Luke [name] is a 39 y.o. male.  Patient complains of acute shortness of breath and tightness in his chest.  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  Patient also has diabetes  The history is provided by the patient. No language interpreter was used.  Shortness of Breath Severity:  Moderate Onset quality:  Sudden Timing:  Constant Progression:  Worsening Chronicity:  New Context: activity   Relieved by:  Nothing Worsened by:  Nothing Ineffective treatments:  None tried Associated symptoms: no abdominal pain, no chest pain, no cough, no headaches and no rash        Past Medical History:  Diagnosis Date   Anemia    Anxiety  Dx 2008   Chronic kidney disease    Diabetes mellitus without complication ([place]) Dx 5732   DKA (diabetic ketoacidoses) ([place]) 07/21/2017   GERD (gastroesophageal reflux disease) Dx 2008   Headache(784.0)    Hypertension    Hypothyroidism    Pneumonia    Seizures ([place]) 2011    x 2 in lifetime. on Dilantin for a while.    Substance abuse ([place]) 2013    heroin use, multiple relapses   Type 1 diabetes [facility])     Patient Active Problem List   Diagnosis Date Noted   Chest pain 08/20/2019   Elevated troponin 08/20/2019   Abscess of right forearm    Endocarditis 05/20/2019   Acute bacterial endocarditis    Sepsis ([place]) 05/09/2019   Heroin abuse ([place]) 05/09/2019   Right forearm cellulitis    Hypothyroidism    Subacute osteomyelitis, left ankle and foot ([place])    Cutaneous abscess of left foot 09/04/2018   Diabetic ulcer of left foot ([place]) 08/28/2018   Acute hematogenous osteomyelitis, left ankle and foot ([place])    Streptococcal bacteremia 11/25/2017   Hepatitis C 11/25/2017   Actinomyces infection     GERD (gastroesophageal reflux disease) 07/21/2017   Diabetic polyneuropathy associated with type 1 diabetes mellitus ([name])    Prostatitis, acute    MRSA infection    Noncompliant bladder    Urinary retention    Prostate abscess    ARF (acute renal failure) ([place]) 12/16/2016   Anemia 12/16/2016   Cocaine abuse ([place]) 08/16/2015   Uncontrolled insulin dependent type 1 diabetes mellitus ([place]) 08/16/2015   Cellulitis 12/03/2014   AKI (acute kidney injury) ([place]) 12/03/2014   Elevated liver enzymes 07/26/2014   Dental caries 07/23/2014   Tinea pedis 07/23/2014   DKA (diabetic ketoacidoses) ([place]) 11/23/2013   Abscess 10/14/2012   CKD (chronic kidney disease) stage 3, GFR 30-59 ml/min 10/14/2012   Drug abuse, IV ([place]) 10/11/2012   Tobacco abuse 10/11/2012   Essential hypertension 10/11/2012    Past Surgical History:  Procedure Laterality Date   AMPUTATION Left 09/05/2018   Procedure: LEFT GREAT TOE AMPUTATION;  Surgeon: [name], MD;  Location: [place];  Service: Orthopedics;  Laterality: Left;   I & D EXTREMITY Left 10/11/2012   Procedure: IRRIGATION AND DEBRIDEMENT ABSCESS FOREARM;  Surgeon: Linna Hoff, MD;  Location: [place];  Service: Orthopedics;  Laterality: Left;   I & D EXTREMITY Left 10/12/2012   Procedure: IRRIGATION AND DEBRIDEMENT FOREARM;  Surgeon: Linna Hoff, MD;  Location: [place];  Service: Orthopedics;  Laterality: Left;   I & D EXTREMITY Left 10/14/2012   Procedure: incision and drainage left forearm;  Surgeon: Linna Hoff, MD;  Location: [place];  Service: Orthopedics;  Laterality: Left;   I & D EXTREMITY Left 10/16/2012   Procedure: IRRIGATION AND DEBRIDEMENT LEFT FOREARM;  Surgeon: Linna Hoff, MD;  Location: [place];  Service: Orthopedics;  Laterality: Left;   I & D EXTREMITY Left 10/20/2012   Procedure: INCISION AND DRAINAGE AND DEBRIDEMENT LEFT  FOREARM;  Surgeon: Linna Hoff, MD;  Location: [place];  Service: Orthopedics;   Laterality: Left;   I & D EXTREMITY Right 05/22/2019   Procedure: IRRIGATION AND DEBRIDEMENT RIGHT ARM;  Surgeon: [name], MD;  Location: [place];  Service: Orthopedics;  Laterality: Right;   INCISION AND DRAINAGE OF WOUND Bilateral 11/29/2017   Procedure: DEBRIDEMENT BILATERAL FEET, DEBRIDEMENT LEFT ANKLE, AND APPLY WOUND VAC;  Surgeon: [name], MD;  Location: [place];  Service: Orthopedics;  Laterality: Bilateral;   IRRIGATION AND DEBRIDEMENT ABSCESS     Hx: of left arm abscess related to drug use    TEE WITHOUT CARDIOVERSION N/A 11/28/2017   Procedure: TRANSESOPHAGEAL ECHOCARDIOGRAM (TEE);  Surgeon: Sanda Klein, MD;  Location: [place];  Service: Cardiovascular;  Laterality: N/A;   TEE WITHOUT CARDIOVERSION N/A 05/12/2019   Procedure: TRANSESOPHAGEAL ECHOCARDIOGRAM (TEE);  Surgeon: Skeet Latch, MD;  Location: [facility] ENDOSCOPY;  Service: Cardiovascular;  Laterality: N/A;       Family History  Problem Relation Age of Onset   Diabetes Father    Heart disease Father    Mental illness Sister    Cancer Neg Hx     Social History   Tobacco Use   Smoking status: Current Every Day Smoker    Packs/day: 1.00    Types: Cigarettes   Smokeless tobacco: Never Used  Vaping Use   Vaping Use: Some days  Substance Use Topics   Alcohol use: No    Alcohol/week: 0.0 standard drinks    Comment: occasional   Drug use: Not Currently    Types: Cocaine, IV, Heroin    Comment: IVDU since Dad died in 05/05/2006 with 15 month period of sobriety 2018-2019, relapse Aug 2019.    Home Medications Prior to Admission medications   Medication Sig Start Date End Date Taking? Authorizing Provider  acetaminophen (TYLENOL) 325 MG tablet Take 2 tablets (650 mg total) by mouth every 6 (six) hours as needed for mild pain (or Fever >/= 101). 12/19/16   Debbe Odea, MD  amLODipine (NORVASC) 5 MG tablet Take 1 tablet (5 mg total) by mouth daily. 04/20/19   Gildardo Pounds, NP  atorvastatin  (LIPITOR) 20 MG tablet Take 1 tablet (20 mg total) by mouth daily. 04/20/19   Gildardo Pounds, NP  Blood Glucose Monitoring Suppl (TRUE METRIX METER) DEVI 1 kit by Does not apply route as directed. Use as directed 06/07/15   Boykin Nearing, MD  carvedilol (COREG) 12.5 MG tablet Take 1 tablet (12.5 mg total) by mouth 2 (two) times daily with a meal. 05/29/19   Nita Sells, MD  doxycycline (VIBRA-TABS) 100 MG tablet Take 1 tablet (100 mg total) by mouth 2 (two) times daily. Patient not taking: Reported on 08/20/2019 06/24/19   Suzan Slick, NP  DULoxetine (CYMBALTA) 30 MG capsule Take 1 capsule (30 mg total) by mouth daily. 06/10/19 09/08/19  Gildardo Pounds, NP  ferrous sulfate 325 (65 FE) MG tablet Take 1 tablet (  325 mg total) by mouth daily with breakfast. 05/29/19   Nita Sells, MD  gabapentin (NEURONTIN) 400 MG capsule Take 1 capsule (400 mg total) by mouth 3 (three) times daily. 04/20/19 08/20/19  Gildardo Pounds, NP  insulin glargine (LANTUS SOLOSTAR) 100 UNIT/ML Solostar Pen INJECT 30 UNITS INTO THE SKIN 2 (TWO) TIMES DAILY. Patient taking differently: Inject 40 Units into the skin daily.  06/25/19   Argentina Donovan, PA-C  insulin lispro (HUMALOG KWIKPEN) 100 UNIT/ML KwikPen Inject 0.04-0.2 mLs (4-20 Units total) into the skin 3 (three) times daily. Per sliding scale Patient taking differently: Inject 5-15 Units into the skin 3 (three) times daily. Per sliding scale 04/20/19   Gildardo Pounds, NP  Insulin Syringe-Needle U-100 (TRUEPLUS INSULIN SYRINGE) 31G X 5/16" 0.5 ML MISC USE AS DIRECTED. 04/20/19   Gildardo Pounds, NP  levothyroxine (SYNTHROID) 50 MCG tablet Take 1 tablet (50 mcg total) by mouth daily. 06/24/19   Gildardo Pounds, NP  Melatonin 5 MG CHEW Chew 5 mg by mouth at bedtime as needed (sleep).    [provider]  omeprazole (PRILOSEC) 20 MG capsule Take 1 capsule (20 mg total) by mouth daily. 04/20/19 08/20/19  Gildardo Pounds, NP  polyethylene glycol (MIRALAX /  GLYCOLAX) 17 g packet Take 17 g by mouth daily as needed for mild constipation. Patient not taking: Reported on 08/20/2019 05/29/19   Nita Sells, MD  sertraline (ZOLOFT) 50 MG tablet TAKE 1 TABLET (50 MG TOTAL) BY MOUTH DAILY. 08/07/19   Charlott Rakes, MD  traZODone (DESYREL) 50 MG tablet Take 1 tablet (50 mg total) by mouth at bedtime. 06/10/19   Gildardo Pounds, NP  TRUEPLUS LANCETS 28G MISC Use as directed 01/28/17   Alfonse Spruce, FNP    Allergies    Patient has no known allergies.  Review of Systems   Review of Systems  Constitutional: Negative for appetite change and fatigue.  HENT: Negative for congestion, ear discharge and sinus pressure.   Eyes: Negative for discharge.  Respiratory: Positive for shortness of breath. Negative for cough.   Cardiovascular: Negative for chest pain.  Gastrointestinal: Negative for abdominal pain and diarrhea.  Genitourinary: Negative for frequency and hematuria.  Musculoskeletal: Negative for back pain.  Skin: Negative for rash.  Neurological: Negative for seizures and headaches.  Psychiatric/Behavioral: Negative for hallucinations.    Physical Exam Updated Vital Signs BP (!) 172/105    Pulse (!) 105    Temp 98.7 F (37.1 C) (Oral)    Resp 18    Ht _0  (1.753 m)    Wt 81.6 kg    SpO2 95%    BMI 26.58 kg/m   Physical Exam Nursing note reviewed.  Constitutional:      Appearance: He is well-developed.  HENT:     Head: Normocephalic.     Nose: Nose normal.  Eyes:     General: No scleral icterus.    Conjunctiva/sclera: Conjunctivae normal.  Neck:     Thyroid: No thyromegaly.  Cardiovascular:     Rate and Rhythm: Regular rhythm. Tachycardia present.     Heart sounds: No murmur heard.  No friction rub. No gallop.   Pulmonary:     Breath sounds: No stridor. Rales present. No wheezing.     Comments: Tachypnea Chest:     Chest wall: No tenderness.  Abdominal:     General: There is no distension.     Tenderness: There  is no abdominal tenderness. There is no  rebound.  Musculoskeletal:        General: Normal range of motion.     Cervical back: Neck supple.  Lymphadenopathy:     Cervical: No cervical adenopathy.  Skin:    Findings: No erythema or rash.  Neurological:     Mental Status: He is alert and oriented to person, place, and time.     Motor: No abnormal muscle tone.     Coordination: Coordination normal.  Psychiatric:        Behavior: Behavior normal.     ED Results / Procedures / Treatments   Labs (all labs ordered are listed, but only abnormal results are displayed) Labs Reviewed  CBC WITH DIFFERENTIAL/PLATELET - Abnormal; Notable for the following components:      Result Value   RBC 3.31 (*)    Hemoglobin 8.5 (*)    HCT 27.9 (*)    MCH 25.7 (*)    Platelets 505 (*)    All other components within normal limits  COMPREHENSIVE METABOLIC PANEL - Abnormal; Notable for the following components:   Glucose, Bld 405 (*)    Creatinine, Ser 1.46 (*)    Calcium 8.5 (*)    Total Protein 6.3 (*)    Albumin 2.6 (*)    GFR calc non Af Amer 60 (*)    All other components within normal limits  D-DIMER, QUANTITATIVE (NOT AT [facility]) - Abnormal; Notable for the following components:   D-Dimer, Quant 2.88 (*)    All other components within normal limits  BRAIN NATRIURETIC PEPTIDE - Abnormal; Notable for the following components:   B Natriuretic Peptide 257.6 (*)    All other components within normal limits  TROPONIN I (HIGH SENSITIVITY) - Abnormal; Notable for the following components:   Troponin I (High Sensitivity) 25 (*)    All other components within normal limits  TROPONIN I (HIGH SENSITIVITY)    EKG None  Radiology DG Chest Port 1 View  Result Date: 08/27/2019 CLINICAL DATA:  Shortness of breath and tachycardia. EXAM: PORTABLE CHEST 1 VIEW COMPARISON:  08/20/2019 prior exams FINDINGS: The cardiopericardial silhouette is unchanged. Pulmonary vascular congestion with mild interstitial  edema noted. There is no evidence of focal airspace disease, suspicious pulmonary nodule/mass, pleural effusion, or pneumothorax. No acute bony abnormalities are identified. IMPRESSION: Pulmonary vascular congestion with mild interstitial edema. Electronically Signed   By: Margarette Canada M.D.   On: 08/27/2019 13:35    Procedures Procedures (including critical care time)  Medications Ordered in ED Medications  sodium chloride flush (NS) 0.9 % injection 10-40 mL (0 mLs Intracatheter Hold 08/27/19 1451)  sodium chloride flush (NS) 0.9 % injection 10-40 mL (has no administration in time range)  furosemide (LASIX) injection 40 mg (40 mg Intravenous Given 08/27/19 1437)  furosemide (LASIX) tablet 40 mg (40 mg Oral Given 08/27/19 1351)  morphine 2 MG/ML injection 2 mg (2 mg Intramuscular Given 08/27/19 1352)    ED Course  I have reviewed the triage vital signs and the nursing notes.  Pertinent labs & imaging results that were available during my care of the patient were reviewed by me and considered in my medical decision making (see chart for details). .edcr CRITICAL CARE Performed by: Milton Ferguson Total critical care time: 45 minutes Critical care time was exclusive of separately billable procedures and treating other patients. Critical care was necessary to treat or prevent imminent or life-threatening deterioration. Critical care was time spent personally by me on the following activities: development of treatment plan with  patient and/or surrogate as well as nursing, discussions with consultants, evaluation of patient's response to treatment, examination of patient, obtaining history from patient or surrogate, ordering and performing treatments and interventions, ordering and review of laboratory studies, ordering and review of radiographic studies, pulse oximetry and re-evaluation of patient's condition.    MDM Rules/Calculators/A&P                          Patient moderate to severe  congestive heart failure.  He will be admitted to medicine.  Patient diuresed well with Lasix       This patient presents to the ED for concern of shortness of breath this involves an extensive number of treatment options, and is a complaint that carries with it a high risk of complications and morbidity.  The differential diagnosis includes congestive heart failure and pneumonia   Lab Tests:   I Ordered, reviewed, and interpreted labs, which included CBC chemistries troponin BNP.  His BMP and troponin were elevated  Medicines ordered:   I ordered medication Lasix and morphine for congestive heart failure  Imaging Studies ordered:   I ordered imaging studies which included chest x-ray and  I independently visualized and interpreted imaging which showed congestive heart failure  Additional history obtained:   Additional history obtained from old records  Previous records obtained and reviewed   Consultations Obtained:   I consulted hospitalist and discussed lab and imaging findings  Reevaluation:  After the interventions stated above, I reevaluated the patient and found much improvement  Critical Interventions:     Final Clinical Impression(s) / ED Diagnoses Final diagnoses:  Systolic congestive heart failure, unspecified HF chronicity ([place])    Rx / DC Orders ED Discharge Orders    None       Milton Ferguson, MD 08/27/19 1616

## 2019-08-27 NOTE — H&P (Addendum)
History and Physical    [name] ZOX:[id number] DOB: [birth year] DOA: 08/27/2019  PCP: Luke Pounds, NP Consultants:  Luke [name] - plastic surgery; Luke [name] - orthopedics  Patient coming from:  Home - lives with wife and 2 children; NOK: Wife, [phone]  Chief Complaint: SOB  HPI: Luke [name] is a 39 y.o. male with medical history significant of type 1 DM; IVDA/polysubstance abuse; seizures; PVD s/p L great toe amputation; hypothyroidism; HTN; endocarditis; and stage 3 CKD presenting with SOB.  Of note, patient presented to the ER on 6/3 for L arm cellulitis with deep ulcer but no osteo; plastic surgery recommended surgical intervention and the patient left AMA on 6/4.  He reports that he "couldn't breathe at all."  He was driving and had to pull over and his friend brought him to the hospital.  He didn't think he was going to make it.  He had substernal chest pain and it is still there.  He was driving when it started.  Nothing makes it better or worse.  Cough during episode, better now.  No fevers.  This is not similar to his prior endocarditis - this was much more severe.  He uses cocaine and heroin.  He uses heroin daily.  The Lasix made him pee a lot and his breathing improved.  Last cocaine use was yesterday.  We spent a lot of time discussing his substance abuse.  He knows that he must quit and understands that he has an extremely limited lifespan if he continues his current patterns.  He sincerely wants to quit and "promises" not to leave AMA during this admission.  He declines Suboxone (he reports that his wife thinks he is actively taking this now) and understands that the withdrawal symptoms may be terrible.  He understands that he may require 6 weeks of hospitalization and/or SNF placement and is willing at this time.    ED Course:  IVDA, h/o endocarditis in February.  Needed to come in for surgery on his arm.  Today, SOB with CHF, [name] Lasix with good UOP.  Troponin 25, BNP 257.   D-dimer elevated but not pursued.  Needs arm surgery but not his primary issue.   Review of Systems: As per HPI; otherwise review of systems reviewed and negative.   Ambulatory Status:  Ambulates without assistance  COVID Vaccine Status:  None  Past Medical History:  Diagnosis Date  . Anemia   . Anxiety  Dx 2008  . Chronic kidney disease   . Diabetes mellitus without complication ([place]) Dx 5621  . DKA (diabetic ketoacidoses) ([place]) 07/21/2017  . Endocarditis 04/2019  . GERD (gastroesophageal reflux disease) Dx 2008  . Headache(784.0)   . Hypertension   . Hypothyroidism   . Pneumonia   . Seizures ([place]) 2011    x 2 in lifetime. on Dilantin for a while.   . Substance abuse ([place]) 2013    heroin use, multiple relapses  . Type 1 diabetes [facility])     Past Surgical History:  Procedure Laterality Date  . AMPUTATION Left 09/05/2018   Procedure: LEFT GREAT TOE AMPUTATION;  Surgeon: [name], MD;  Location: [place];  Service: Orthopedics;  Laterality: Left;  . I & D EXTREMITY Left 10/11/2012   Procedure: IRRIGATION AND DEBRIDEMENT ABSCESS FOREARM;  Surgeon: Linna Hoff, MD;  Location: [place];  Service: Orthopedics;  Laterality: Left;  . I & D EXTREMITY Left 10/12/2012   Procedure: IRRIGATION AND DEBRIDEMENT FOREARM;  Surgeon: Janelle Floor  Caralyn Guile, MD;  Location: [place];  Service: Orthopedics;  Laterality: Left;  . I & D EXTREMITY Left 10/14/2012   Procedure: incision and drainage left forearm;  Surgeon: Linna Hoff, MD;  Location: [place];  Service: Orthopedics;  Laterality: Left;  . I & D EXTREMITY Left 10/16/2012   Procedure: IRRIGATION AND DEBRIDEMENT LEFT FOREARM;  Surgeon: Linna Hoff, MD;  Location: [place];  Service: Orthopedics;  Laterality: Left;  . I & D EXTREMITY Left 10/20/2012   Procedure: INCISION AND DRAINAGE AND DEBRIDEMENT LEFT  FOREARM;  Surgeon: Linna Hoff, MD;  Location: [place];  Service: Orthopedics;  Laterality: Left;  . I & D EXTREMITY Right 05/22/2019   Procedure:  IRRIGATION AND DEBRIDEMENT RIGHT ARM;  Surgeon: [name], MD;  Location: [place];  Service: Orthopedics;  Laterality: Right;  . INCISION AND DRAINAGE OF WOUND Bilateral 11/29/2017   Procedure: DEBRIDEMENT BILATERAL FEET, DEBRIDEMENT LEFT ANKLE, AND APPLY WOUND VAC;  Surgeon: [name], MD;  Location: [place];  Service: Orthopedics;  Laterality: Bilateral;  . IRRIGATION AND DEBRIDEMENT ABSCESS     Hx: of left arm abscess related to drug use   . TEE WITHOUT CARDIOVERSION N/A 11/28/2017   Procedure: TRANSESOPHAGEAL ECHOCARDIOGRAM (TEE);  Surgeon: Sanda Klein, MD;  Location: [place];  Service: Cardiovascular;  Laterality: N/A;  . TEE WITHOUT CARDIOVERSION N/A 05/12/2019   Procedure: TRANSESOPHAGEAL ECHOCARDIOGRAM (TEE);  Surgeon: Skeet Latch, MD;  Location: [facility];  Service: Cardiovascular;  Laterality: N/A;    Social History   Socioeconomic History  . Marital status: Married    Spouse name: Not on file  . Number of children: 5  . Years of education: some colle  . Highest education level: Not on file  Occupational History  . Occupation: Event set up    Employer: [place]   . Occupation: Stencil for cars   Tobacco Use  . Smoking status: Current Every Day Smoker    Packs/day: 1.00    Years: 25.00    Pack years: 25.00    Types: Cigarettes  . Smokeless tobacco: Never Used  . Tobacco comment: decreased over the last few weeks because he has not been breathing well  Vaping Use  . Vaping Use: Some days  Substance and Sexual Activity  . Alcohol use: No    Alcohol/week: 0.0 standard drinks    Comment: rarely  . Drug use: Not Currently    Types: Cocaine, IV, Heroin    Comment: IVDU since Dad died in 06/05/2006 with 15 month period of sobriety 2018-2019, relapse Aug 2019.  Marland Kitchen Sexual activity: Yes    Partners: Female    Birth control/protection: None  Other Topics Concern  . Not on file  Social History Narrative   Lives with wife and two  children age 69 and 37.          Social Determinants of Health   Financial Resource Strain:   . Difficulty of Paying Living Expenses:   Food Insecurity:   . Worried About Charity fundraiser in the Last Year:   . Arboriculturist in the Last Year:   Transportation Needs:   . Film/video editor (Medical):   Marland Kitchen Lack of Transportation (Non-Medical):   Physical Activity:   . Days of Exercise per Week:   . Minutes of Exercise per Session:   Stress:   . Feeling of Stress :   Social Connections:   . Frequency  of Communication with Friends and Family:   . Frequency of Social Gatherings with Friends and Family:   . Attends Religious Services:   . Active Member of Clubs or Organizations:   . Attends Archivist Meetings:   Marland Kitchen Marital Status:   Intimate Partner Violence:   . Fear of Current or Ex-Partner:   . Emotionally Abused:   Marland Kitchen Physically Abused:   . Sexually Abused:     No Known Allergies  Family History  Problem Relation Age of Onset  . Diabetes Father   . Heart disease Father   . Mental illness Sister   . Cancer Neg Hx     Prior to Admission medications   Medication Sig Start Date End Date Taking? Authorizing Provider  acetaminophen (TYLENOL) 325 MG tablet Take 2 tablets (650 mg total) by mouth every 6 (six) hours as needed for mild pain (or Fever >/= 101). 12/19/16   Debbe Odea, MD  amLODipine (NORVASC) 5 MG tablet Take 1 tablet (5 mg total) by mouth daily. 04/20/19   Luke Pounds, NP  atorvastatin (LIPITOR) 20 MG tablet Take 1 tablet (20 mg total) by mouth daily. 04/20/19   Luke Pounds, NP  Blood Glucose Monitoring Suppl (TRUE METRIX METER) DEVI 1 kit by Does not apply route as directed. Use as directed 06/07/15   Boykin Nearing, MD  carvedilol (COREG) 12.5 MG tablet Take 1 tablet (12.5 mg total) by mouth 2 (two) times daily with a meal. 05/29/19   Nita Sells, MD  doxycycline (VIBRA-TABS) 100 MG tablet Take 1 tablet (100 mg total) by mouth 2  (two) times daily. Patient not taking: Reported on 08/20/2019 06/24/19   Suzan Slick, NP  DULoxetine (CYMBALTA) 30 MG capsule Take 1 capsule (30 mg total) by mouth daily. 06/10/19 09/08/19  Luke Pounds, NP  ferrous sulfate 325 (65 FE) MG tablet Take 1 tablet (325 mg total) by mouth daily with breakfast. 05/29/19   Nita Sells, MD  gabapentin (NEURONTIN) 400 MG capsule Take 1 capsule (400 mg total) by mouth 3 (three) times daily. 04/20/19 08/20/19  Luke Pounds, NP  insulin glargine (LANTUS SOLOSTAR) 100 UNIT/ML Solostar Pen INJECT 30 UNITS INTO THE SKIN 2 (TWO) TIMES DAILY. Patient taking differently: Inject 40 Units into the skin daily.  06/25/19   Argentina Donovan, PA-C  insulin lispro (HUMALOG KWIKPEN) 100 UNIT/ML KwikPen Inject 0.04-0.2 mLs (4-20 Units total) into the skin 3 (three) times daily. Per sliding scale Patient taking differently: Inject 5-15 Units into the skin 3 (three) times daily. Per sliding scale 04/20/19   Luke Pounds, NP  Insulin Syringe-Needle U-100 (TRUEPLUS INSULIN SYRINGE) 31G X 5/16" 0.5 ML MISC USE AS DIRECTED. 04/20/19   Luke Pounds, NP  levothyroxine (SYNTHROID) 50 MCG tablet Take 1 tablet (50 mcg total) by mouth daily. 06/24/19   Luke Pounds, NP  Melatonin 5 MG CHEW Chew 5 mg by mouth at bedtime as needed (sleep).    [provider]  omeprazole (PRILOSEC) 20 MG capsule Take 1 capsule (20 mg total) by mouth daily. 04/20/19 08/20/19  Luke Pounds, NP  polyethylene glycol (MIRALAX / GLYCOLAX) 17 g packet Take 17 g by mouth daily as needed for mild constipation. Patient not taking: Reported on 08/20/2019 05/29/19   Nita Sells, MD  sertraline (ZOLOFT) 50 MG tablet TAKE 1 TABLET (50 MG TOTAL) BY MOUTH DAILY. 08/07/19   Charlott Rakes, MD  traZODone (DESYREL) 50 MG tablet Take 1 tablet (50  mg total) by mouth at bedtime. 06/10/19   Luke Pounds, NP  TRUEPLUS LANCETS 28G MISC Use as directed 01/28/17   Alfonse Spruce, FNP     Physical Exam: Vitals:   08/27/19 1729 08/27/19 1730 08/27/19 1731 08/27/19 1732  BP:  131/79    Pulse: 94 94 95 95  Resp: 15 (!) '21 13 17  '$ Temp:      TempSrc:      SpO2:      Weight:      Height:         . General:  Appears calm and comfortable and is NAD . Eyes:  PERRL, EOMI, normal lids, iris . ENT:  grossly normal hearing, lips & tongue, mmm . Neck:  no LAD, masses or thyromegaly . Cardiovascular:  RR with tachycardia, no r/g, 5/6 systolic murmur that is audible across his entire back and chest. No LE edema.  Marland Kitchen Respiratory:   CTA bilaterally with no wheezes/rales/rhonchi.  Normal respiratory effort. . Abdomen:  soft, NT, ND, NABS . Back:   normal alignment, no CVAT . Skin:  L forearm wrapped and left without visualization; patient reports that is better than his right.  Right forearm pictured below with significant tissue necrosis and purulent drainage.  Also with recent L great toe amputation and stage 2 pressure ulcer on dorsal 2nd toe (pictured below the forearm picture).  Diffuse needle marks on arms and legs.        . Musculoskeletal:  grossly normal tone BUE/BLE, good ROM, no bony abnormality, marked tissue destruction as noted above . Psychiatric:  blunted mood and affect, speech fluent and appropriate, AOx3; poor eye contact and emotional lability when frankly discussing his substance abuse . Neurologic:  CN 2-12 grossly intact, moves all extremities in coordinated fashion    Radiological Exams on Admission: DG Chest Port 1 View  Result Date: 08/27/2019 CLINICAL DATA:  Shortness of breath and tachycardia. EXAM: PORTABLE CHEST 1 VIEW COMPARISON:  08/20/2019 prior exams FINDINGS: The cardiopericardial silhouette is unchanged. Pulmonary vascular congestion with mild interstitial edema noted. There is no evidence of focal airspace disease, suspicious pulmonary nodule/mass, pleural effusion, or pneumothorax. No acute bony abnormalities are identified. IMPRESSION:  Pulmonary vascular congestion with mild interstitial edema. Electronically Signed   By: Margarette Canada M.D.   On: 08/27/2019 13:35    EKG: Independently reviewed.  Sinus tachycardia with rate 130; nonspecific ST changes with no evidence of acute ischemia   Labs on Admission: I have personally reviewed the available labs and imaging studies at the time of the admission.  Pertinent labs:   Glucose 405 BUN 13/Creatinine 1.46/GFR 60 Albumin 2.6 BNP 257.6 HS troponin 25 WBC 10.4 Hgb 8.5 Platelets 505 D-dimer 2.88   Assessment/Plan Principal Problem:   Endocarditis Active Problems:   Drug abuse, IV ([name])   Tobacco abuse   CKD (chronic kidney disease) stage 3, GFR 30-59 ml/min   Uncontrolled insulin dependent type 1 diabetes mellitus ([place])   Diabetic ulcer of left foot ([place])   Hypothyroidism   Heroin abuse ([place])   Congestive heart failure (CHF) ([place])    Endocarditis/CHF -Patient with preserved EF on echo on 2/23 but large mitral valve leaflets c/w endocarditis; blood cultures from 2/19 were c/w Strep Midis Oralis -He was hospitalized from 2/19-27 and left AMA -He had previously been diagnosed with strep viridans bacteremia on 03/19/19 and left AMA without treatment then, as well -He presented today with progressive SOB and apparent volume overload on CXR -Mildly  elevated BNP -Previously with preserved EF but mitral valve vegetations and endocarditis was not adequately treated in Feb since he left AMA -Extremely loud murmur noted on exam today -Overall picture is highly concerning for endocarditis -He has responded well to diuresis -Echo ordered; anticipate that this will be positive but if negative then will need TEE -Very likely to need long-term antibiotics -Patient d/w Dr. Linus Salmons, who will add the patient to the list for ID consultation tomorrow -For now, will start Vanc and Cefepime -I have spoken with the nurse and asked that blood cultures be drawn ASAP, prior to starting  antibiotics  R forearm necrosis -Patient with R > L forearm wounds associated with polysubstance abuse -R forearm with necrosis, purulent drainage -He was scheduled to go to the OR with Dr. Claudia [name] on 6/7 but did not show up -This infection dates back to as least February, when Dr. Erlinda Hong was planning I&D with wound vac placement and the patient signed out AMA -He was hospitalized again from 3/2-12 and underwent I&D of the right arm wound with vac placement -He returned 6/2-4 for ongoing infection and again left AMA despite plastic surgery recommendation for surgical intervention -I have consulted Dr. Claudia [name] to request reconsultation, he will see him tomorrow  Uncontrolled type 1 DM -Currently not in DKA -Will continue Lantus and SSI for now -Diabetes coordinator consultation requested  Stage 3a CKD -Appears to be at/near baseline at this time  L 2nd toe ulcer -Patient with prior L great toe amputation -He wore a compression sleeve that caused a pressure ulcer -Appears to be stage 2, not currently concerning for osteo -LE wound order set utilized including labs (CRP, ESR, A1c, prealbumin, HIV, and blood cultures) and consults (CM; diabetes coordinator; peripheral vascular navigator; SW; wound care; and nutrition)  IVDA/Polysubstance abuse -Long-standing heroin dependence -Patient was previously on Suboxone but now reports that he wants to quit without maintenance therapy -He understands that withdrawal will be very difficult and declines Suboxone -He has a pattern of leaving AMA but has promised that he will complete his treatment this time -Will monitor on COWS protocol -prn orders from the Clonidine withdrawal order set were also ordered.  Substance-induced mood disorder -Continue home meds - Zoloft, Cymbalta, neurontin, Trazodone  Hep C -HCV quant 5,170,000 on 2/24 -Needs outpatient ID f/u  -This is important but a lesser consideration at this time  HTN -Continue Norvasc,  Coreg  Hypothyroidism -Normal TSH on 4/6 -Continue Synthroid at current dose for now  HLD -Continue Lipitor  Tobacco dependence -Encourage cessation.   -This was discussed with the patient and should be reviewed on an ongoing basis.   -Patch ordered   Note: This patient has been tested and is pending for the novel coronavirus COVID-19.  DVT prophylaxis:  SCDs Code Status:  Full - confirmed with patient Family Communication: None present; he initially asked me to update his wife but changed his mind when I explained that I would be honest with her regarding his drug use Disposition Plan:  The patient is from: home  Anticipated d/c is to: uncertain - anticipate prolonged hospitalization vs. SNF placement if patient does not leave AMA  Anticipated d/c date will depend on clinical response to treatment, but he appears to need 6 weeks of IV antibiotic treatment and is not safe to leave with a PICC line  Patient is currently: acutely ill Consults called: ID; Plastic surgery; TOC team; diabetes coordinator; peripheral vascular navigator; wound care; and nutrition Admission status:  Admit - It is my clinical opinion that admission to INPATIENT is reasonable and necessary because of the expectation that this patient will require hospital care that crosses at least 2 midnights to treat this condition based on the medical complexity of the problems presented.  [name] the aforementioned information, the predictability of an adverse outcome is felt to be significant.    Karmen Bongo MD [facility]   How to contact the [facility] Attending or Consulting provider [place] or covering provider during after hours [place], for this patient?  1. Check the care team in [facility] and look for a) attending/consulting TR[facility] provider listed and b) the [facility] team listed 2. Log into [email or web address] and use [place]'s universal password to access. If you do not have the password, please contact the hospital  operator. 3. Locate the [facility] provider you are looking for under [facility] and page to a number that you can be directly reached. 4. If you still have difficulty reaching the provider, please page the [facility] (Director on Call) for the Hospitalists listed on amion for assistance.   08/27/2019, 5:36 PM

## 2019-08-27 NOTE — ED Notes (Signed)
Placed on 4lpm [place]. EDP at bedside.

## 2019-08-27 NOTE — ED Notes (Signed)
Attempted to draw blood/obtain IV access, unable to obtain. Pt states that he typically requires IV team to place midline cath d/t h/o IV drug use.

## 2019-08-27 NOTE — Progress Notes (Signed)
Pharmacy Antibiotic Note  Luke [name] is a 39 y.o. male admitted on 08/27/2019 with possible IE.  Pharmacy has been consulted for vancomycin and cefepime dosing. Also note that patient has a deep ulcer without osteo on L arm.   Patient has a history of IVDA and endocarditis in February (BCx grew strep mitis/oralis). Afebrile. Creatinine is 1.46 but appears around baseline. WBC count wnl at 10.4.   Plan: Vancomycin 1500 mg IV x1 loading dose  Vancomycin 1000 IV every 12 hours.  Goal trough 15-20 mcg/mL. Cefepime 2 g IV every 8 hours  Follow up clinical status, renal function, cultures Monitor TEE, de-escalation   Height: 5\' 9"  (175.3 cm) Weight: 81.6 kg (180 lb) IBW/kg (Calculated) : 70.7  Temp (24hrs), Avg:98.7 F (37.1 C), Min:98.7 F (37.1 C), Max:98.7 F (37.1 C)  Recent Labs  Lab 08/21/19 0345 08/27/19 1415  WBC 14.2* 10.4  CREATININE 1.48* 1.46*    Estimated Creatinine Clearance: 67.9 mL/min (A) (by C-G formula based on SCr of 1.46 mg/dL (H)).    No Known Allergies  Antimicrobials this admission: cefepime 6/10 >>  vanco 6/10 >>   Dose adjustments this admission: N/A  Microbiology results: 6/10 BCx: sent   Thank you for allowing pharmacy to be a part of this patient's care.  Eddie Candle, PharmD PGY-1 Pharmacy Resident   Please check amion for clinical pharmacist contact number 08/27/2019 5:34 PM

## 2019-08-27 NOTE — Progress Notes (Signed)
Lab was unable to locate site for blood culture draw.  MD made aware, Night RN updated.  Pt has open wounds to right and left posterior forearm and left second toe.  All open areas have a yellow/pink base.  All wounds cleansed, petroleum dressing applied, covered with abd pad wrapped with kerlex.  Pt tolerated dressing change well.

## 2019-08-27 NOTE — ED Triage Notes (Addendum)
Patient complains of increased SOB and HR 130s x 2 weeks. Wheezing noted. States that he has ongoing dry cough and SOB worse with any exertion. mpatient was to be admitted last week for same and left AMA

## 2019-08-28 ENCOUNTER — Inpatient Hospital Stay: Payer: Self-pay

## 2019-08-28 ENCOUNTER — Inpatient Hospital Stay (HOSPITAL_COMMUNITY): Payer: Medicaid Other

## 2019-08-28 DIAGNOSIS — N1831 Chronic kidney disease, stage 3a: Secondary | ICD-10-CM

## 2019-08-28 DIAGNOSIS — E039 Hypothyroidism, unspecified: Secondary | ICD-10-CM

## 2019-08-28 DIAGNOSIS — I96 Gangrene, not elsewhere classified: Secondary | ICD-10-CM

## 2019-08-28 DIAGNOSIS — I340 Nonrheumatic mitral (valve) insufficiency: Secondary | ICD-10-CM

## 2019-08-28 DIAGNOSIS — E109 Type 1 diabetes mellitus without complications: Secondary | ICD-10-CM

## 2019-08-28 DIAGNOSIS — I351 Nonrheumatic aortic (valve) insufficiency: Secondary | ICD-10-CM

## 2019-08-28 LAB — GLUCOSE, CAPILLARY
Glucose-Capillary: 219 mg/dL — ABNORMAL HIGH (ref 70–99)
Glucose-Capillary: 72 mg/dL (ref 70–99)
Glucose-Capillary: 317 mg/dL — ABNORMAL HIGH (ref 70–99)
Glucose-Capillary: 194 mg/dL — ABNORMAL HIGH (ref 70–99)

## 2019-08-28 LAB — CBC
HCT: 26.9 % — ABNORMAL LOW (ref 39.0–52.0)
Hemoglobin: 8.5 g/dL — ABNORMAL LOW (ref 13.0–17.0)
MCH: 25.5 pg — ABNORMAL LOW (ref 26.0–34.0)
MCHC: 31.6 g/dL (ref 30.0–36.0)
MCV: 80.8 fL (ref 80.0–100.0)
Platelets: 540 10*3/uL — ABNORMAL HIGH (ref 150–400)
RBC: 3.33 MIL/uL — ABNORMAL LOW (ref 4.22–5.81)
RDW: 13.7 % (ref 11.5–15.5)
WBC: 12 10*3/uL — ABNORMAL HIGH (ref 4.0–10.5)
nRBC: 0 % (ref 0.0–0.2)

## 2019-08-28 LAB — BASIC METABOLIC PANEL
Anion gap: 9 (ref 5–15)
BUN: 14 mg/dL (ref 6–20)
CO2: 25 mmol/L (ref 22–32)
Calcium: 8.6 mg/dL — ABNORMAL LOW (ref 8.9–10.3)
Chloride: 102 mmol/L (ref 98–111)
Creatinine, Ser: 1.51 mg/dL — ABNORMAL HIGH (ref 0.61–1.24)
GFR calc Af Amer: >60 mL/min (ref >60)
GFR calc non Af Amer: 57 mL/min — ABNORMAL LOW (ref >60)
Glucose, Bld: 261 mg/dL — ABNORMAL HIGH (ref 70–99)
Potassium: 3.2 mmol/L — ABNORMAL LOW (ref 3.5–5.1)
Sodium: 136 mmol/L (ref 135–145)

## 2019-08-28 LAB — ECHOCARDIOGRAM COMPLETE
Height: 69 in
Weight: 2625.6 oz

## 2019-08-28 MED ORDER — ADULT MULTIVITAMIN W/MINERALS CH
1.0000 | ORAL_TABLET | Freq: Every day | ORAL | Status: DC
  Administered 2019-08-28 – 2019-09-10 (×13): 1 via ORAL
  Filled 2019-08-28 (×12): qty 1

## 2019-08-28 MED ORDER — JUVEN PO PACK
1.0000 | PACK | Freq: Two times a day (BID) | ORAL | Status: DC
  Administered 2019-08-30 – 2019-09-06 (×7): 1 via ORAL
  Filled 2019-08-28 (×10): qty 1

## 2019-08-28 MED ORDER — ENSURE MAX PROTEIN PO LIQD
11.0000 [oz_av] | Freq: Every day | ORAL | Status: DC
  Administered 2019-08-28 – 2019-09-04 (×4): 11 [oz_av] via ORAL
  Administered 2019-09-05: 237 mL via ORAL
  Administered 2019-09-06 – 2019-09-08 (×3): 11 [oz_av] via ORAL
  Filled 2019-08-28 (×14): qty 330

## 2019-08-28 MED ORDER — INSULIN GLARGINE 100 UNIT/ML ~~LOC~~ SOLN
40.0000 [IU] | Freq: Every day | SUBCUTANEOUS | Status: DC
  Administered 2019-08-28: 40 [IU] via SUBCUTANEOUS
  Filled 2019-08-28 (×2): qty 0.4

## 2019-08-28 MED ORDER — POTASSIUM CHLORIDE CRYS ER 20 MEQ PO TBCR
40.0000 meq | EXTENDED_RELEASE_TABLET | ORAL | Status: AC
  Administered 2019-08-28 (×2): 40 meq via ORAL
  Filled 2019-08-28 (×2): qty 2

## 2019-08-28 NOTE — Plan of Care (Signed)
  Problem: Education: Goal: Knowledge of General Education information will improve Description Including pain rating scale, medication(s)/side effects and non-pharmacologic comfort measures Outcome: Progressing   Problem: Health Behavior/Discharge Planning: Goal: Ability to manage health-related needs will improve Outcome: Progressing   

## 2019-08-28 NOTE — Progress Notes (Signed)
  Echocardiogram 2D Echocardiogram has been performed.  Michiel Cowboy 08/28/2019, 11:56 AM

## 2019-08-28 NOTE — Progress Notes (Signed)
Initial Nutrition Assessment  DOCUMENTATION CODES:   Not applicable  INTERVENTION:   -MVI with minerals daily -Ensure Max po daily, each supplement provides 150 kcal and 30 grams of protein -Double protein portions at each meal -1 packet Juven BID, each packet provides 95 calories, 2.5 grams of protein (collagen), and 9.8 grams of carbohydrate (3 grams sugar); also contains 7 grams of L-arginine and L-glutamine, 300 mg vitamin C, 15 mg vitamin E, 1.2 mcg vitamin B-12, 9.5 mg zinc, 200 mg calcium, and 1.5 g  Calcium Beta-hydroxy-Beta-methylbutyrate to support wound healing  NUTRITION DIAGNOSIS:   Increased nutrient needs related to wound healing as evidenced by estimated needs.  GOAL:   Patient will meet greater than or equal to 90% of their needs  MONITOR:   PO intake, Supplement acceptance, Labs, Weight trends, Skin, I & O's  REASON FOR ASSESSMENT:   Consult Wound healing  ASSESSMENT:   Luke [name] is a 39 y.o. male with medical history significant of type 1 DM; IVDA/polysubstance abuse; seizures; PVD s/p L great toe amputation; hypothyroidism; HTN; endocarditis; and stage 3 CKD presenting with SOB.  Pt admitted with endocarditis.   Reviewed I/O's: +482 ml x 24 hours  UOP: 750 ml x 24 hours  Attempted to speak with pt x 2, however, receiving nursing care at time of visit. Unable to obtain further nutrition-related history or complete nutrition-focused physical exam at this time.   Per [facility] notes, pt with highly necrotic full thickness BUE wounds, which are related to IV drug use. Pt also with full thickness wound on lt second toe, which appears to be exposed to joint.   Pt with good appetite. Noted meal completion 100%.   Reviewed wt hx; pt has experienced a 17.3% wt loss over the past 3 months. Suspect some wt loss is related to uncontrolled DM.   Lab Results  Component Value Date   HGBA1C 10.8 (H) 08/27/2019   PTA DM medications are 40 units inuslin glargine  daily and 5-15 units insulin lispro daily.   Labs reviewed: CBGS: 219 (inpatient orders for glycemic control are 0-15 units insulin aspart TID with meals, 0-5 units insulin aspart q HS, 40 units insulin glargine daily).   Diet Order:   Diet Order            Diet Carb Modified Fluid consistency: Thin; Room service appropriate? Yes  Diet effective now                 EDUCATION NEEDS:   No education needs have been identified at this time  Skin:  Skin Assessment: Skin Integrity Issues: Skin Integrity Issues:: Other (Comment) Other: lt second toe full thickness wound with possible exposed joint; highly necrotic, full thickness BUE wounds related to IV drug abuse  Last BM:  08/26/19  Height:   Ht Readings from Last 1 Encounters:  08/27/19 5\' 9"  (1.753 m)    Weight:   Wt Readings from Last 1 Encounters:  08/28/19 74.4 kg    Ideal Body Weight:  72.7 kg  BMI:  Body mass index is 24.23 kg/m.  Estimated Nutritional Needs:   Kcal:  8295-6213  Protein:  130-145 grams  Fluid:  > 2.2 L    Loistine Chance, RD, LDN, [place] Registered Dietitian II Certified Diabetes Care and Education Specialist Please refer to [facility] for RD and/or RD on-call/weekend/after hours pager

## 2019-08-28 NOTE — Progress Notes (Signed)
Secure chat Dr. [place] Pink RE: PICC placement order, order to hold off PICC per pending blood cultures. Patient have current LUA midline, working well. RN aware.

## 2019-08-28 NOTE — Progress Notes (Signed)
[facility] PROGRESS NOTE   Luke [name] VFI:[id number] DOB: 04-25-1980 DOA: 08/27/2019  PCP: Gildardo Pounds, NP  Brief History/Interval Summary: 39 y.o. male with medical history significant of type 1 DM; IVDA/polysubstance abuse; seizures; PVD s/p L great toe amputation; hypothyroidism; HTN; endocarditis; and stage 3 CKD presenting with SOB.  Of note, patient presented to the ER on 6/3 for L arm cellulitis with deep ulcer but no osteo; plastic surgery recommended surgical intervention and the patient left AMA on 6/4.  He reports that he "couldn't breathe at all."  He was driving and had to pull over and his friend brought him to the hospital.  He didn't think he was going to make it.  He had substernal chest pain and it is still there.  He was driving when it started.  Nothing makes it better or worse.  Cough during episode, better now.  No fevers.  This is not similar to his prior endocarditis - this was much more severe.  He uses cocaine and heroin.  He uses heroin daily.  The Lasix made him pee a lot and his breathing improved.  Last cocaine use was the day prior to admission.   Reason for Visit: Left arm cellulitis.  Concern for an infective endocarditis  Consultants: Infectious disease  Procedures:   Transthoracic echocardiogram IMPRESSIONS  1. Mitral and pulmonic valve vegetations. Endocarditis.  2. Left ventricular ejection fraction, by estimation, is 70 to 75%. The  left ventricle has hyperdynamic function. The left ventricle has no  regional wall motion abnormalities. Left ventricular diastolic parameters  were normal.  3. Right ventricular systolic function is normal. The right ventricular  size is normal.  4. There is a anterior mitral leaflet valvular vegetation (1.1 x 0.6 cm).  The mitral valve is abnormal. Moderate mitral valve regurgitation. No  evidence of mitral stenosis.  5. The aortic valve is normal in structure. Aortic valve regurgitation is  mild.  No aortic stenosis is present.  6. Possible vegetation on pulmonic valve. The pulmonic valve was  abnormal.  7. The inferior vena cava is normal in size with greater than 50%  respiratory variability, suggesting right atrial pressure of 3 mmHg.    Antibiotics: Anti-infectives (From admission, onward)   Start     Dose/Rate Route Frequency Ordered Stop   08/28/19 0630  vancomycin (VANCOCIN) IVPB 1000 mg/200 mL premix     Discontinue     1,000 mg 200 mL/hr over 60 Minutes Intravenous Every 12 hours 08/27/19 1742     08/27/19 1800  vancomycin (VANCOREADY) IVPB 1500 mg/300 mL        1,500 mg 150 mL/hr over 120 Minutes Intravenous  Once 08/27/19 1742 08/27/19 2230   08/27/19 1800  ceFEPIme (MAXIPIME) 2 g in sodium chloride 0.9 % 100 mL IVPB     Discontinue     2 g 200 mL/hr over 30 Minutes Intravenous Every 8 hours 08/27/19 1742        Subjective/Interval History: Patient complains of pain in the chest area in the retrosternal region.  6 out of 10 in intensity.  Worse with deep breathing.  Denies any shortness of breath.  Requesting something to eat.  ROS: No nausea or vomiting    Assessment/Plan:  Infective endocarditis Patient admitted back in February for infective endocarditis.  However he left AGAINST MEDICAL ADVICE after he spent about 8 days in the hospital.  Blood cultures at that time grew Streptococcus mitis/oralis.  Patient presented on June 3  and again left AGAINST MEDICAL ADVICE.  He presented again yesterday.  Echocardiogram once again shows evidence for mitral valve vegetation with moderate regurgitation.  Possible vegetation also noted on the pulmonic valve.  Ejection fraction is noted to be normal.  Continue broad-spectrum antibiotics with vancomycin and cefepime for now.  Waiting on blood cultures.  We will also wait on further input from infectious disease.  May need to involve cardiothoracic surgery although patient may be a poor candidate for surgical intervention  considering his noncompliance.  Necrosis of the right forearm Patient has wounds in both of his forearms right greater than left.  There is evidence for purulent wound involving the right forearm.  Skin necrosis is also noted.  He was scheduled to go to the operating room on June 7 for plastic surgery to be done by Dr. Claudia Desanctis but apparently patient did not show up.  Plastic surgery has been reconsulted.  It looks like the plan is for surgery on Tuesday.  Diabetes mellitus type 1, uncontrolled HbA1c 10.8.  Monitor CBGs.  Continue insulin.  Chronic kidney disease stage IIIa Seems to be close to his baseline renal function.  Replace potassium.  Monitor urine output.  Ulcer involving the left second toe Patient with prior history of left first toe amputation.  Wound care.  History of IV drug abuse/polysubstance abuse He has longstanding heroin dependence.  Patient was previously on Suboxone but he is not interested in this treatment as of now.  Continue to monitor for withdrawal.  Substance induced mood disorder Continue home medications  History of hepatitis C HCV quant was 5,170,000 on 2/24.  And ID follow-up.  Essential hypertension Patient noted to be on amlodipine and carvedilol.  Blood pressure is reasonably well controlled.  Tobacco dependence Cessation was encouraged.  Nicotine patch.  Hypothyroidism Continue levothyroxine.  Hyperlipidemia Continue Lipitor   DVT Prophylaxis: SCDs Code Status: Full code Family Communication: Discussed with the patient Disposition Plan:  Status is: Inpatient  Remains inpatient appropriate because:IV treatments appropriate due to intensity of illness or inability to take PO   Dispo: The patient is from: Home              Anticipated d/c is to: Home              Anticipated d/c date is: > 3 days              Patient currently is not medically stable to d/c.    Medications:  Scheduled: . amLODipine  5 mg Oral Daily  . atorvastatin   20 mg Oral Daily  . carvedilol  12.5 mg Oral BID WC  . cloNIDine  0.1 mg Oral QID   Followed by  . [START ON 08/30/2019] cloNIDine  0.1 mg Oral BH-qamhs   Followed by  . [START ON 09/01/2019] cloNIDine  0.1 mg Oral QAC breakfast  . docusate sodium  100 mg Oral BID  . DULoxetine  30 mg Oral Daily  . gabapentin  400 mg Oral TID  . insulin aspart  0-15 Units Subcutaneous TID WC  . insulin aspart  0-5 Units Subcutaneous QHS  . insulin glargine  40 Units Subcutaneous Daily  . levothyroxine  50 mcg Oral Q0600  . nicotine  14 mg Transdermal Daily  . pantoprazole  40 mg Oral Daily  . potassium chloride  40 mEq Oral Q4H  . sertraline  50 mg Oral Daily  . sodium chloride flush  10-40 mL Intracatheter Q12H  . sodium chloride  flush  3 mL Intravenous Q12H  . traZODone  50 mg Oral QHS   Continuous: . ceFEPime (MAXIPIME) IV 2 g (08/28/19 0527)  . lactated ringers 75 mL/hr at 08/28/19 0847  . vancomycin 1,000 mg (08/28/19 0630)   HLK:TGYBWLSLHTDSK **OR** acetaminophen, dicyclomine, hydrALAZINE, hydrOXYzine, loperamide, melatonin, methocarbamol, morphine injection, naproxen, ondansetron **OR** ondansetron (ZOFRAN) IV, ondansetron, polyethylene glycol, sodium chloride flush   Objective:  Vital Signs  Vitals:   08/27/19 2350 08/28/19 0406 08/28/19 0807 08/28/19 1210  BP: 104/60 133/68 117/74 126/79  Pulse: 85 87 86 89  Resp: 18 18 20 18   Temp: 99 F (37.2 C) 98.8 F (37.1 C) 98 F (36.7 C) 98.4 F (36.9 C)  TempSrc: Oral Oral Oral Oral  SpO2: 93% 98% 97% 96%  Weight:  74.4 kg    Height:        Intake/Output Summary (Last 24 hours) at 08/28/2019 1225 Last data filed at 08/28/2019 0700 Gross per 24 hour  Intake 1231.5 ml  Output 750 ml  Net 481.5 ml   Filed Weights   08/27/19 1244 08/27/19 1900 08/28/19 0406  Weight: 81.6 kg 81.6 kg 74.4 kg    General appearance: Awake alert.  In no distress Resp: Clear to auscultation bilaterally.  Normal effort Cardio: S1-S2 is normal  regular.  No S3-S4.  Loud systolic murmur appreciated over the precordium. GI: Abdomen is soft.  Nontender nondistended.  Bowel sounds are present normal.  No masses organomegaly Extremities: Both forearms covered with dressing. Neurologic: Alert and oriented x3.  No focal neurological deficits.    Lab Results:  Data Reviewed: I have personally reviewed following labs and imaging studies  CBC: Recent Labs  Lab 08/27/19 1415 08/28/19 0509  WBC 10.4 12.0*  NEUTROABS 7.3  --   HGB 8.5* 8.5*  HCT 27.9* 26.9*  MCV 84.3 80.8  PLT 505* 540*    Basic Metabolic Panel: Recent Labs  Lab 08/27/19 1415 08/28/19 0509  NA 136 136  K 4.3 3.2*  CL 105 102  CO2 22 25  GLUCOSE 405* 261*  BUN 13 14  CREATININE 1.46* 1.51*  CALCIUM 8.5* 8.6*    GFR: Estimated Creatinine Clearance: 65.7 mL/min (A) (by C-G formula based on SCr of 1.51 mg/dL (H)).  Liver Function Tests: Recent Labs  Lab 08/27/19 1415  AST 16  ALT 14  ALKPHOS 103  BILITOT 0.7  PROT 6.3*  ALBUMIN 2.6*    HbA1C: Recent Labs    08/27/19 2142  HGBA1C 10.8*    CBG: Recent Labs  Lab 08/21/19 1226 08/21/19 1719 08/27/19 2125 08/28/19 0614 08/28/19 1208  GLUCAP 208* 372* 360* 219* 72      Recent Results (from the past 240 hour(s))  Culture, blood (Routine x 2)     Status: None   Collection Time: 08/19/19 10:55 PM   Specimen: BLOOD  Result Value Ref Range Status   Specimen Description BLOOD RIGHT ARM  Final   Special Requests   Final    BOTTLES DRAWN AEROBIC AND ANAEROBIC Blood Culture adequate volume   Culture   Final    NO GROWTH 5 DAYS Performed at [facility], [place] [address] [place], [place] [address]    Report Status 08/25/2019 FINAL  Final  Culture, blood (Routine x 2)     Status: None   Collection Time: 08/19/19 11:09 PM   Specimen: BLOOD  Result Value Ref Range Status   Specimen Description BLOOD LEFT ARM  Final   Special Requests  Final    BOTTLES DRAWN AEROBIC ONLY Blood  Culture adequate volume   Culture   Final    NO GROWTH 5 DAYS Performed at [facility], [place] [address], [place], [place] [address]    Report Status 08/25/2019 FINAL  Final  SARS Coronavirus 2 by RT PCR (hospital order, performed in [facility] hospital lab) Nasopharyngeal Nasopharyngeal Swab     Status: None   Collection Time: 08/20/19  9:10 AM   Specimen: Nasopharyngeal Swab  Result Value Ref Range Status   SARS Coronavirus 2 NEGATIVE NEGATIVE Final    Comment: (NOTE) SARS-CoV-2 target nucleic acids are NOT DETECTED. The SARS-CoV-2 RNA is generally detectable in upper and lower respiratory specimens during the acute phase of infection. The lowest concentration of SARS-CoV-2 viral copies this assay can detect is 250 copies / mL. A negative result does not preclude SARS-CoV-2 infection and should not be used as the sole basis for treatment or other patient management decisions.  A negative result may occur with improper specimen collection / handling, submission of specimen other than nasopharyngeal swab, presence of viral mutation(s) within the areas targeted by this assay, and inadequate number of viral copies (<250 copies / mL). A negative result must be combined with clinical observations, patient history, and epidemiological information. Fact Sheet for Patients:   StrictlyIdeas.no Fact Sheet for Healthcare Providers: BankingDealers.co.za This test is not yet approved or cleared  by the [place] FDA and has been authorized for detection and/or diagnosis of SARS-CoV-2 by FDA under an Emergency Use Authorization (EUA).  This EUA will remain in effect (meaning this test can be used) for the duration of the COVID-19 declaration under Section 564(b)(1) of the Act, 21 U.S.C. section 360bbb-3(b)(1), unless the authorization is terminated or revoked sooner. Performed at [facility], [place] [address] [place],  [place] [address]   SARS Coronavirus 2 by RT PCR (hospital order, performed in [facility] hospital lab) Nasopharyngeal Nasopharyngeal Swab     Status: None   Collection Time: 08/27/19  5:42 PM   Specimen: Nasopharyngeal Swab  Result Value Ref Range Status   SARS Coronavirus 2 NEGATIVE NEGATIVE Final    Comment: (NOTE) SARS-CoV-2 target nucleic acids are NOT DETECTED.  The SARS-CoV-2 RNA is generally detectable in upper and lower respiratory specimens during the acute phase of infection. The lowest concentration of SARS-CoV-2 viral copies this assay can detect is 250 copies / mL. A negative result does not preclude SARS-CoV-2 infection and should not be used as the sole basis for treatment or other patient management decisions.  A negative result may occur with improper specimen collection / handling, submission of specimen other than nasopharyngeal swab, presence of viral mutation(s) within the areas targeted by this assay, and inadequate number of viral copies (<250 copies / mL). A negative result must be combined with clinical observations, patient history, and epidemiological information.  Fact Sheet for Patients:   StrictlyIdeas.no  Fact Sheet for Healthcare Providers: BankingDealers.co.za  This test is not yet approved or  cleared by the [place] FDA and has been authorized for detection and/or diagnosis of SARS-CoV-2 by FDA under an Emergency Use Authorization (EUA).  This EUA will remain in effect (meaning this test can be used) for the duration of the COVID-19 declaration under Section 564(b)(1) of the Act, 21 U.S.C. section 360bbb-3(b)(1), unless the authorization is terminated or revoked sooner.  Performed at [facility], [place] [address], [place], [place] [address]   Blood  Cultures x 2 sites     Status: None (Preliminary result)   Collection Time: 08/27/19  9:36 PM   Specimen: BLOOD RIGHT WRIST  Result Value Ref  Range Status   Specimen Description BLOOD RIGHT WRIST  Final   Special Requests   Final    BOTTLES DRAWN AEROBIC AND ANAEROBIC Blood Culture adequate volume   Culture   Final    NO GROWTH < 12 HOURS Performed at [facility], [address], [place], [place] [address]    Report Status PENDING  Incomplete  Blood Cultures x 2 sites     Status: None (Preliminary result)   Collection Time: 08/27/19  9:46 PM   Specimen: BLOOD LEFT WRIST  Result Value Ref Range Status   Specimen Description BLOOD LEFT WRIST  Final   Special Requests   Final    BOTTLES DRAWN AEROBIC AND ANAEROBIC Blood Culture adequate volume   Culture   Final    NO GROWTH < 12 HOURS Performed at [facility], [place] [address] [place], [place] [address]    Report Status PENDING  Incomplete      Radiology Studies: DG Chest Port 1 View  Result Date: 08/27/2019 CLINICAL DATA:  Shortness of breath and tachycardia. EXAM: PORTABLE CHEST 1 VIEW COMPARISON:  08/20/2019 prior exams FINDINGS: The cardiopericardial silhouette is unchanged. Pulmonary vascular congestion with mild interstitial edema noted. There is no evidence of focal airspace disease, suspicious pulmonary nodule/mass, pleural effusion, or pneumothorax. No acute bony abnormalities are identified. IMPRESSION: Pulmonary vascular congestion with mild interstitial edema. Electronically Signed   By: Margarette Canada M.D.   On: 08/27/2019 13:35   ECHOCARDIOGRAM COMPLETE  Result Date: 08/28/2019    ECHOCARDIOGRAM REPORT   Patient Name:   Luke [name] Date of Exam: 08/28/2019 Medical Rec #:  [id number]    Height:       69.0 in Accession #:    [id number]   Weight:       164.1 lb Date of Birth:  [birth year]    BSA:          1.899 m Patient Age:    57 years     BP:           117/74 mmHg Patient Gender: M            HR:           84 bpm. Exam Location:  Inpatient Procedure: 2D Echo, Cardiac Doppler and Color Doppler Indications:    Endocarditis I38  History:        Patient has  prior history of Echocardiogram examinations, most                 recent 05/12/2019. CHF, Signs/Symptoms:Chest Pain; Risk                 Factors:Diabetes and Current Smoker. Endocarditis. Elevated                 troponin. GERD. IV drug abuse.  Sonographer:    Vickie Epley RDCS Referring Phys: [place]  1. Mitral and pulmonic valve vegetations. Endocarditis.  2. Left ventricular ejection fraction, by estimation, is 70 to 75%. The left ventricle has hyperdynamic function. The left ventricle has no regional wall motion abnormalities. Left ventricular diastolic parameters were normal.  3. Right ventricular systolic function is normal. The right ventricular size is normal.  4. There is a anterior mitral leaflet valvular vegetation (1.1 x 0.6 cm). The  mitral valve is abnormal. Moderate mitral valve regurgitation. No evidence of mitral stenosis.  5. The aortic valve is normal in structure. Aortic valve regurgitation is mild. No aortic stenosis is present.  6. Possible vegetation on pulmonic valve. The pulmonic valve was abnormal.  7. The inferior vena cava is normal in size with greater than 50% respiratory variability, suggesting right atrial pressure of 3 mmHg. FINDINGS  Left Ventricle: Left ventricular ejection fraction, by estimation, is 70 to 75%. The left ventricle has hyperdynamic function. The left ventricle has no regional wall motion abnormalities. The left ventricular internal cavity size was normal in size. There is no left ventricular hypertrophy. Left ventricular diastolic parameters were normal. Right Ventricle: The right ventricular size is normal. No increase in right ventricular wall thickness. Right ventricular systolic function is normal. Left Atrium: Left atrial size was normal in size. Right Atrium: Right atrial size was normal in size. Pericardium: There is no evidence of pericardial effusion. Mitral Valve: There is a anterior mitral leaflet valvular vegetation (1.1 x 0.6 cm).  The mitral valve is abnormal. There is mild thickening of the mitral valve leaflet(s). Normal mobility of the mitral valve leaflets. Moderate mitral valve regurgitation,  with eccentric laterally directed jet. No evidence of mitral valve stenosis. Tricuspid Valve: The tricuspid valve is normal in structure. Tricuspid valve regurgitation is not demonstrated. No evidence of tricuspid stenosis. Aortic Valve: The aortic valve is normal in structure. Aortic valve regurgitation is mild. Aortic regurgitation PHT measures 390 msec. No aortic stenosis is present. Pulmonic Valve: Possible vegetation on pulmonic valve. The pulmonic valve was abnormal. Pulmonic valve regurgitation is trivial. No evidence of pulmonic stenosis. Aorta: The aortic root is normal in size and structure. Venous: The inferior vena cava is normal in size with greater than 50% respiratory variability, suggesting right atrial pressure of 3 mmHg. IAS/Shunts: No atrial level shunt detected by color flow Doppler. Additional Comments: Mitral and pulmonic valve vegetations. Endocarditis.  LEFT VENTRICLE PLAX 2D LVIDd:         5.30 cm  Diastology LVIDs:         3.40 cm  LV e' lateral: 11.00 cm/s LV PW:         0.80 cm  LV e' medial:  7.83 cm/s LV IVS:        0.80 cm LVOT diam:     1.90 cm LV SV:         53 LV SV Index:   28 LVOT Area:     2.84 cm  RIGHT VENTRICLE RV S prime:     12.80 cm/s TAPSE (M-mode): 1.8 cm LEFT ATRIUM             Index       RIGHT ATRIUM          Index LA diam:        5.00 cm 2.63 cm/m  RA Area:     7.22 cm LA Vol (A2C):   61.3 ml 32.27 ml/m RA Volume:   10.40 ml 5.48 ml/m LA Vol (A4C):   58.0 ml 30.54 ml/m LA Biplane Vol: 59.7 ml 31.43 ml/m  AORTIC VALVE LVOT Vmax:   99.80 cm/s LVOT Vmean:  68.000 cm/s LVOT VTI:    0.188 m AI PHT:      390 msec  AORTA Ao Root diam: 2.80 cm MR Peak grad: 100.0 mmHg MR Mean grad: 69.0 mmHg   SHUNTS MR Vmax:      500.00 cm/s Systemic VTI:  0.19 m  MR Vmean:     386.0 cm/s  Systemic Diam: 1.90 cm Candee Furbish MD Electronically signed by Candee Furbish MD Signature Date/Time: 08/28/2019/12:20:14 PM    Final    [place] EKG SITE RITE  Result Date: 08/28/2019 If Site Rite image not attached, placement could not be confirmed due to current cardiac rhythm.      LOS: 1 day   Kvon Mcilhenny [employer] on [email or web address]  08/28/2019, 12:25 PM

## 2019-08-28 NOTE — Plan of Care (Signed)
  Problem: Nutrition: Goal: Adequate nutrition will be maintained Outcome: Completed/Met   Problem: Elimination: Goal: Will not experience complications related to bowel motility Outcome: Completed/Met Goal: Will not experience complications related to urinary retention Outcome: Completed/Met   Problem: Safety: Goal: Ability to remain free from injury will improve Outcome: Completed/Met   

## 2019-08-28 NOTE — Progress Notes (Signed)
Inpatient Diabetes Program Recommendations  AACE/ADA: New Consensus Statement on Inpatient Glycemic Control (2015)  Target Ranges:  Prepandial:   less than 140 mg/dL      Peak postprandial:   less than 180 mg/dL (1-2 hours)      Critically ill patients:  140 - 180 mg/dL   Lab Results  Component Value Date   GLUCAP 219 (H) 08/28/2019   HGBA1C 10.8 (H) 08/27/2019    Review of Glycemic Control Upper extremity wounds from IVD use/toe wound/Endocarditis Diabetes history: DM type 1 Outpatient Diabetes medications: Lantus 40 units, Humalog 5-15 units tid  Current orders for Inpatient glycemic control: Lantus 40 units Novolog 0-15 units tid + hs  A1c 10.8% this admission unchanged from previous admissions. DM Coordinators have seen in the past last visit on 05/27/19.   Pts mother is a  Marine scientist in past reports rides pt all the time. Until pt controls drug use, will not be able to lower A1c. Pt reports being able to get and take all insulins previously.  Agree with current regimen and will follow glucose trends.  Thanks,  Tama Headings RN, MSN, BC-ADM Inpatient Diabetes Coordinator Team Pager [phone] (8a-5p)

## 2019-08-28 NOTE — Consult Note (Signed)
[place] Nurse Consult Note: Patient receiving care in [place]. Reason for Consult: BUE wounds and left 2nd toe wound Wound type: highly necrotic, full thickness BUE wounds related to IVD use. Left second toe is full thickness wound with what I believe to be an exposed joint, tender to touch, edematous. Pressure Injury POA: Yes/No/NA Measurement: Wound bed: Drainage (amount, consistency, odor)  Periwound: Dressing procedure/placement/frequency: Place Xeroform gauzes Kellie Simmering [phone]) over BUE wounds and left great toe wound. Secure with kerlex, change daily. Topical therapy alone will not solve these wound issues.  Additional amputation surgery may be necessary for the toe wound. Monitor the wound area(s) for worsening of condition such as: Signs/symptoms of infection,  Increase in size,  Development of or worsening of odor, Development of pain, or increased pain at the affected locations.  Notify the medical team if any of these develop.  Thank you for the consult.  Discussed plan of care with the patient.  [place] nurse will not follow at this time.  Please re-consult the [place] team if needed.  Val Riles, RN, MSN, CWOCN, CNS-BC, pager [phone]

## 2019-08-28 NOTE — Consult Note (Signed)
[facility]    Date of Admission:  08/27/2019     Total days of antibiotics 1               Reason for Consult: Endocarditis  Referring Provider: [place] [name] Primary Care Provider: Gildardo Pounds, NP   ASSESSMENT:  Luke [name] is a 39 y/o male with previous partial treatment of Streptococcal oralis/mitis bacteremia and endocarditis now admitted with shortness of breath and necrotic bilateral upper extremity wounds likely needing surgical intervention and awaiting Plastic Surgery consult. Blood cultures are without growth in <12 hours. Would recommend holding central line placement/PICC placement pending blood cultures unless access is emergently needed or becomes an issue. Continue wound care per Wound RN recommendations at the present time. Continue with broad spectrum vancomycin and cefepime pending blood culture results and narrow as able. Diabetes and continued substance use will likely complicate healing and increase risk of infection. Based on previous history he is at high risk for leaving AMA and will create alternative plan which will likely be oral and less than ideal.    PLAN:  1. Continue vancomycin and cefepime. 2. Await Plastic Surgery consult/recommendations 3. TTE pending.  4. Monitor blood cultures for bacteremia 5. Hold PICC/Central line pending blood cultures. 6. Diabetes and substance use per primary team.    Principal Problem:   Endocarditis Active Problems:   Drug abuse, IV ([name])   Tobacco abuse   CKD (chronic kidney disease) stage 3, GFR 30-59 ml/min   Uncontrolled insulin dependent type 1 diabetes mellitus ([place])   Diabetic ulcer of left foot ([name])   Hypothyroidism   Heroin abuse ([place])   Congestive heart failure (CHF) ([place])   . amLODipine  5 mg Oral Daily  . atorvastatin  20 mg Oral Daily  . carvedilol  12.5 mg Oral BID WC  . cloNIDine  0.1 mg Oral QID   Followed by  . [START ON 08/30/2019] cloNIDine  0.1 mg Oral  BH-qamhs   Followed by  . [START ON 09/01/2019] cloNIDine  0.1 mg Oral QAC breakfast  . docusate sodium  100 mg Oral BID  . DULoxetine  30 mg Oral Daily  . gabapentin  400 mg Oral TID  . insulin aspart  0-15 Units Subcutaneous TID WC  . insulin aspart  0-5 Units Subcutaneous QHS  . insulin glargine  40 Units Subcutaneous Daily  . levothyroxine  50 mcg Oral Q0600  . nicotine  14 mg Transdermal Daily  . pantoprazole  40 mg Oral Daily  . sertraline  50 mg Oral Daily  . sodium chloride flush  10-40 mL Intracatheter Q12H  . sodium chloride flush  3 mL Intravenous Q12H  . traZODone  50 mg Oral QHS     HPI: Luke [name] is a 39 y.o. male with previous medical history of Type 1 Diabetes, polysubstance use, seizures, peripheral vascular disease s/p L great toe amputation, hypothyroidism and recent history of Streptococcus oralis/mitis bacteremia complicated endocarditis in February 2021 admitted acute onset shortness of breath.  [name] was recently seen in the ED on 6/3 for left arm cellulitis with deep ulcer. Imaging at the time was without osteomyelitis. Plastic surgery recommended surgical intervention, however he left AM on 6/4. He continued to use heroin and cocaine.  [name] was last seen by ID team in February 2021 and was partially treated for endocarditis with 1 week of penicillin/gentamycin, however left AMA prior to the completion of treatment. He was  given a dose of oritivancin at the start of treatment.   Chest x-ray with pulmonary vascular congestion with mild interstitial edema. Afebrile since admission with leukocytosis of 12.0. Current antibiotics are broad spectrum coverage with vancomycin and cefepime. Blood cultures drawn on admission are without growth in <12 hours. Wound RN evaluation was requested for bilateral upper extremity wounds and left 2nd toe wounds. He has full thickness highly necrotic wounds of his bilateral upper extremities and possibly exposed joint of the  left 2nd toe. Currently awaiting surgical evaluation. Awaiting TTE.    Review of Systems: Review of Systems  Constitutional: Negative for chills, fever and weight loss.  Respiratory: Negative for cough, shortness of breath and wheezing.   Cardiovascular: Negative for chest pain and leg swelling.  Gastrointestinal: Negative for abdominal pain, constipation, diarrhea, nausea and vomiting.  Skin: Negative for rash.     Past Medical History:  Diagnosis Date  . Anemia   . Anxiety  Dx Jun 21, 2006  . Chronic kidney disease   . Diabetes mellitus without complication ([place]) Dx 6294  . DKA (diabetic ketoacidoses) ([place]) 07/21/2017  . Endocarditis 04/2019  . GERD (gastroesophageal reflux disease) Dx 2006/06/21  . Headache(784.0)   . Hypertension   . Hypothyroidism   . Pneumonia   . Seizures ([place]) 20-Jun-2009    x 2 in lifetime. on Dilantin for a while.   . Substance abuse ([place]) Jun 21, 2011    heroin use, multiple relapses  . Type 1 diabetes ([name])     Social History   Tobacco Use  . Smoking status: Current Every Day Smoker    Packs/day: 1.00    Years: 25.00    Pack years: 25.00    Types: Cigarettes  . Smokeless tobacco: Never Used  . Tobacco comment: decreased over the last few weeks because he has not been breathing well  Vaping Use  . Vaping Use: Some days  Substance Use Topics  . Alcohol use: No    Alcohol/week: 0.0 standard drinks    Comment: rarely  . Drug use: Not Currently    Types: Cocaine, IV, Heroin    Comment: IVDU since Dad died in 06/21/2006 with 15 month period of sobriety 2018-2019, relapse Aug 2019.    Family History  Problem Relation Age of Onset  . Diabetes Father   . Heart disease Father   . Mental illness Sister   . Cancer Neg Hx     No Known Allergies  OBJECTIVE: Blood pressure 117/74, pulse 86, temperature 98 F (36.7 C), temperature source Oral, resp. rate 20, height 5\' 9"  (1.753 m), weight 74.4 kg, SpO2 97 %.  Physical Exam Constitutional:      General: He is not in  acute distress.    Appearance: He is well-developed.     Comments: Lying in bed resting; arousable.   Cardiovascular:     Rate and Rhythm: Normal rate and regular rhythm.     Heart sounds: Normal heart sounds.  Pulmonary:     Effort: Pulmonary effort is normal.     Breath sounds: Normal breath sounds.  Skin:    General: Skin is warm and dry.     Comments: Bilateral upper extremities wrapped in clean and dry dressing.   Neurological:     Mental Status: He is alert and oriented to person, place, and time.  Psychiatric:        Behavior: Behavior normal.        Thought Content: Thought content normal.  Judgment: Judgment normal.     Lab Results Lab Results  Component Value Date   WBC 12.0 (H) 08/28/2019   HGB 8.5 (L) 08/28/2019   HCT 26.9 (L) 08/28/2019   MCV 80.8 08/28/2019   PLT 540 (H) 08/28/2019    Lab Results  Component Value Date   CREATININE 1.46 (H) 08/27/2019   BUN 13 08/27/2019   NA 136 08/27/2019   K 4.3 08/27/2019   CL 105 08/27/2019   CO2 22 08/27/2019    Lab Results  Component Value Date   ALT 14 08/27/2019   AST 16 08/27/2019   ALKPHOS 103 08/27/2019   BILITOT 0.7 08/27/2019     Microbiology: Recent Results (from the past 240 hour(s))  Culture, blood (Routine x 2)     Status: None   Collection Time: 08/19/19 10:55 PM   Specimen: BLOOD  Result Value Ref Range Status   Specimen Description BLOOD RIGHT ARM  Final   Special Requests   Final    BOTTLES DRAWN AEROBIC AND ANAEROBIC Blood Culture adequate volume   Culture   Final    NO GROWTH 5 DAYS Performed at [facility], [address] [place], [place] [address]    Report Status 08/25/2019 FINAL  Final  Culture, blood (Routine x 2)     Status: None   Collection Time: 08/19/19 11:09 PM   Specimen: BLOOD  Result Value Ref Range Status   Specimen Description BLOOD LEFT ARM  Final   Special Requests   Final    BOTTLES DRAWN AEROBIC ONLY Blood Culture adequate volume   Culture    Final    NO GROWTH 5 DAYS Performed at [facility], [address], [place], [place] [address]    Report Status 08/25/2019 FINAL  Final  SARS Coronavirus 2 by RT PCR (hospital order, performed in [facility] hospital lab) Nasopharyngeal Nasopharyngeal Swab     Status: None   Collection Time: 08/20/19  9:10 AM   Specimen: Nasopharyngeal Swab  Result Value Ref Range Status   SARS Coronavirus 2 NEGATIVE NEGATIVE Final    Comment: (NOTE) SARS-CoV-2 target nucleic acids are NOT DETECTED. The SARS-CoV-2 RNA is generally detectable in upper and lower respiratory specimens during the acute phase of infection. The lowest concentration of SARS-CoV-2 viral copies this assay can detect is 250 copies / mL. A negative result does not preclude SARS-CoV-2 infection and should not be used as the sole basis for treatment or other patient management decisions.  A negative result may occur with improper specimen collection / handling, submission of specimen other than nasopharyngeal swab, presence of viral mutation(s) within the areas targeted by this assay, and inadequate number of viral copies (<250 copies / mL). A negative result must be combined with clinical observations, patient history, and epidemiological information. Fact Sheet for Patients:   StrictlyIdeas.no Fact Sheet for Healthcare Providers: BankingDealers.co.za This test is not yet approved or cleared  by the [place] FDA and has been authorized for detection and/or diagnosis of SARS-CoV-2 by FDA under an Emergency Use Authorization (EUA).  This EUA will remain in effect (meaning this test can be used) for the duration of the COVID-19 declaration under Section 564(b)(1) of the Act, 21 U.S.C. section 360bbb-3(b)(1), unless the authorization is terminated or revoked sooner. Performed at [facility], [place] [address], [place], [place] [address]   SARS Coronavirus 2 by RT PCR  (hospital order, performed in [facility] hospital lab) Nasopharyngeal Nasopharyngeal Swab  Status: None   Collection Time: 08/27/19  5:42 PM   Specimen: Nasopharyngeal Swab  Result Value Ref Range Status   SARS Coronavirus 2 NEGATIVE NEGATIVE Final    Comment: (NOTE) SARS-CoV-2 target nucleic acids are NOT DETECTED.  The SARS-CoV-2 RNA is generally detectable in upper and lower respiratory specimens during the acute phase of infection. The lowest concentration of SARS-CoV-2 viral copies this assay can detect is 250 copies / mL. A negative result does not preclude SARS-CoV-2 infection and should not be used as the sole basis for treatment or other patient management decisions.  A negative result may occur with improper specimen collection / handling, submission of specimen other than nasopharyngeal swab, presence of viral mutation(s) within the areas targeted by this assay, and inadequate number of viral copies (<250 copies / mL). A negative result must be combined with clinical observations, patient history, and epidemiological information.  Fact Sheet for Patients:   StrictlyIdeas.no  Fact Sheet for Healthcare Providers: BankingDealers.co.za  This test is not yet approved or  cleared by the [place] FDA and has been authorized for detection and/or diagnosis of SARS-CoV-2 by FDA under an Emergency Use Authorization (EUA).  This EUA will remain in effect (meaning this test can be used) for the duration of the COVID-19 declaration under Section 564(b)(1) of the Act, 21 U.S.C. section 360bbb-3(b)(1), unless the authorization is terminated or revoked sooner.  Performed at [facility], [place] [address], [place], [place] [address]   Blood Cultures x 2 sites     Status: None (Preliminary result)   Collection Time: 08/27/19  9:36 PM   Specimen: BLOOD RIGHT WRIST  Result Value Ref Range Status   Specimen Description BLOOD  RIGHT WRIST  Final   Special Requests   Final    BOTTLES DRAWN AEROBIC AND ANAEROBIC Blood Culture adequate volume   Culture   Final    NO GROWTH < 12 HOURS Performed at [facility], [place] [address], [place], [place] [address]    Report Status PENDING  Incomplete  Blood Cultures x 2 sites     Status: None (Preliminary result)   Collection Time: 08/27/19  9:46 PM   Specimen: BLOOD LEFT WRIST  Result Value Ref Range Status   Specimen Description BLOOD LEFT WRIST  Final   Special Requests   Final    BOTTLES DRAWN AEROBIC AND ANAEROBIC Blood Culture adequate volume   Culture   Final    NO GROWTH < 12 HOURS Performed at [facility], [place] [address], [place], [place] [address]    Report Status PENDING  Incomplete     Terri Piedra, NP [facility]  08/28/2019  9:44 AM

## 2019-08-28 NOTE — Consult Note (Signed)
Reason for Consult: bilateral arm wounds Referring Physician: Dr. Karmen Bongo  Luke [name] is an 39 y.o. male.  HPI:  Patient is a 39 yr-old male who presented to the ER on 08/19/19 for evaluation of chest pain and SOB for ~ 1 week and was admitted.  He left AMA and returned on 6/10 for evaluation of SOB.  He has past medical history significant for insulin-dependent diabetes mellitus, hypothyroidism, IVDA/polysubstance abuse, seizures, HTN, endocarditis, stage III CKD, Hx great toe amputation.  He has bilateral forearm nonhealing wounds.  Wounds are fairly similar in appearance to that was observed on 6/4.  He reports the wound on his right arm was debrided approximately 2 months ago but never completely healed.  He denies fever, nausea/vomiting.  Reports his chest pain and shortness of breath has improved slightly since admission.   Past Medical History:  Diagnosis Date  . Anemia   . Anxiety  Dx 2008  . Chronic kidney disease   . Diabetes mellitus without complication ([place]) Dx 7341  . DKA (diabetic ketoacidoses) ([place]) 07/21/2017  . Endocarditis 04/2019  . GERD (gastroesophageal reflux disease) Dx 2008  . Headache(784.0)   . Hypertension   . Hypothyroidism   . Pneumonia   . Seizures ([place]) 2011    x 2 in lifetime. on Dilantin for a while.   . Substance abuse ([place]) 2013    heroin use, multiple relapses  . Type 1 diabetes [facility])     Past Surgical History:  Procedure Laterality Date  . AMPUTATION Left 09/05/2018   Procedure: LEFT GREAT TOE AMPUTATION;  Surgeon: [name], MD;  Location: [place];  Service: Orthopedics;  Laterality: Left;  . I & D EXTREMITY Left 10/11/2012   Procedure: IRRIGATION AND DEBRIDEMENT ABSCESS FOREARM;  Surgeon: Linna Hoff, MD;  Location: [place];  Service: Orthopedics;  Laterality: Left;  . I & D EXTREMITY Left 10/12/2012   Procedure: IRRIGATION AND DEBRIDEMENT FOREARM;  Surgeon: Linna Hoff, MD;  Location: [place];  Service: Orthopedics;  Laterality:  Left;  . I & D EXTREMITY Left 10/14/2012   Procedure: incision and drainage left forearm;  Surgeon: Linna Hoff, MD;  Location: [place];  Service: Orthopedics;  Laterality: Left;  . I & D EXTREMITY Left 10/16/2012   Procedure: IRRIGATION AND DEBRIDEMENT LEFT FOREARM;  Surgeon: Linna Hoff, MD;  Location: [place];  Service: Orthopedics;  Laterality: Left;  . I & D EXTREMITY Left 10/20/2012   Procedure: INCISION AND DRAINAGE AND DEBRIDEMENT LEFT  FOREARM;  Surgeon: Linna Hoff, MD;  Location: [place];  Service: Orthopedics;  Laterality: Left;  . I & D EXTREMITY Right 05/22/2019   Procedure: IRRIGATION AND DEBRIDEMENT RIGHT ARM;  Surgeon: [name], MD;  Location: [place];  Service: Orthopedics;  Laterality: Right;  . INCISION AND DRAINAGE OF WOUND Bilateral 11/29/2017   Procedure: DEBRIDEMENT BILATERAL FEET, DEBRIDEMENT LEFT ANKLE, AND APPLY WOUND VAC;  Surgeon: [name], MD;  Location: [place];  Service: Orthopedics;  Laterality: Bilateral;  . IRRIGATION AND DEBRIDEMENT ABSCESS     Hx: of left arm abscess related to drug use   . TEE WITHOUT CARDIOVERSION N/A 11/28/2017   Procedure: TRANSESOPHAGEAL ECHOCARDIOGRAM (TEE);  Surgeon: Sanda Klein, MD;  Location: [place];  Service: Cardiovascular;  Laterality: N/A;  . TEE WITHOUT CARDIOVERSION N/A 05/12/2019   Procedure: TRANSESOPHAGEAL ECHOCARDIOGRAM (TEE);  Surgeon: Skeet Latch, MD;  Location: [place];  Service: Cardiovascular;  Laterality: N/A;  Family History  Problem Relation Age of Onset  . Diabetes Father   . Heart disease Father   . Mental illness Sister   . Cancer Neg Hx     Social History:  reports that he has been smoking cigarettes. He has a 25.00 pack-year smoking history. He has never used smokeless tobacco. He reports previous drug use. Drugs: Cocaine, IV, and Heroin. He reports that he does not drink alcohol.  Allergies: No Known Allergies  Medications: I have reviewed the patient's current  medications.  Results for orders placed or performed during the hospital encounter of 08/27/19 (from the past 48 hour(s))  CBC with Differential/Platelet     Status: Abnormal   Collection Time: 08/27/19  2:15 PM  Result Value Ref Range   WBC 10.4 4.0 - 10.5 K/uL   RBC 3.31 (L) 4.22 - 5.81 MIL/uL   Hemoglobin 8.5 (L) 13.0 - 17.0 g/dL   HCT 27.9 (L) 39 - 52 %   MCV 84.3 80.0 - 100.0 fL   MCH 25.7 (L) 26.0 - 34.0 pg   MCHC 30.5 30.0 - 36.0 g/dL   RDW 14.0 11.5 - 15.5 %   Platelets 505 (H) 150 - 400 K/uL   nRBC 0.0 0.0 - 0.2 %   Neutrophils Relative % 70 %   Neutro Abs 7.3 1.7 - 7.7 K/uL   Lymphocytes Relative 20 %   Lymphs Abs 2.1 0.7 - 4.0 K/uL   Monocytes Relative 7 %   Monocytes Absolute 0.7 0 - 1 K/uL   Eosinophils Relative 2 %   Eosinophils Absolute 0.2 0 - 0 K/uL   Basophils Relative 1 %   Basophils Absolute 0.1 0 - 0 K/uL   Immature Granulocytes 0 %   Abs Immature Granulocytes 0.04 0.00 - 0.07 K/uL    Comment: Performed at [facility], [address] [place], [place] [address]  Comprehensive metabolic panel     Status: Abnormal   Collection Time: 08/27/19  2:15 PM  Result Value Ref Range   Sodium 136 135 - 145 mmol/L   Potassium 4.3 3.5 - 5.1 mmol/L   Chloride 105 98 - 111 mmol/L   CO2 22 22 - 32 mmol/L   Glucose, Bld 405 (H) 70 - 99 mg/dL    Comment: Glucose reference range applies only to samples taken after fasting for at least 8 hours.   BUN 13 6 - 20 mg/dL   Creatinine, Ser 1.46 (H) 0.61 - 1.24 mg/dL   Calcium 8.5 (L) 8.9 - 10.3 mg/dL   Total Protein 6.3 (L) 6.5 - 8.1 g/dL   Albumin 2.6 (L) 3.5 - 5.0 g/dL   AST 16 15 - 41 U/L   ALT 14 0 - 44 U/L   Alkaline Phosphatase 103 38 - 126 U/L   Total Bilirubin 0.7 0.3 - 1.2 mg/dL   GFR calc non Af Amer 60 (L) >60 mL/min   GFR calc Af Amer >60 >60 mL/min   Anion gap 9 5 - 15    Comment: Performed at [facility] [address] [place], [place] [address]  Troponin I (High Sensitivity)     Status:  Abnormal   Collection Time: 08/27/19  2:15 PM  Result Value Ref Range   Troponin I (High Sensitivity) 25 (H) <18 ng/L    Comment: (NOTE) Elevated high sensitivity troponin I (hsTnI) values and significant  changes across serial measurements may suggest ACS but many other  chronic and acute conditions are  known to elevate hsTnI results.  Refer to the "Links" section for chest pain algorithms and additional  guidance. Performed at [facility], [place] [address], [place], [place] [address]   D-dimer, quantitative (not at [facility])     Status: Abnormal   Collection Time: 08/27/19  2:15 PM  Result Value Ref Range   D-Dimer, Quant 2.88 (H) 0.00 - 0.50 ug/mL-FEU    Comment: (NOTE) At the manufacturer cut-off of 0.50 ug/mL FEU, this assay has been documented to exclude PE with a sensitivity and negative predictive value of 97 to 99%.  At this time, this assay has not been approved by the FDA to exclude DVT/VTE. Results should be correlated with clinical presentation. Performed at [facility], [place] [address], [place], [place] [address]   Brain natriuretic peptide     Status: Abnormal   Collection Time: 08/27/19  2:15 PM  Result Value Ref Range   B Natriuretic Peptide 257.6 (H) 0.0 - 100.0 pg/mL    Comment: Performed at [facility] [address], [place], [place] [address]  Troponin I (High Sensitivity)     Status: Abnormal   Collection Time: 08/27/19  3:35 PM  Result Value Ref Range   Troponin I (High Sensitivity) 29 (H) <18 ng/L    Comment: (NOTE) Elevated high sensitivity troponin I (hsTnI) values and significant  changes across serial measurements may suggest ACS but many other  chronic and acute conditions are known to elevate hsTnI results.  Refer to the "Links" section for chest pain algorithms and additional  guidance. Performed at [facility], [place] [address] [place], [place] [address]   SARS Coronavirus 2 by RT PCR (hospital order, performed in [facility] hospital lab) Nasopharyngeal Nasopharyngeal Swab     Status: None   Collection Time: 08/27/19  5:42 PM   Specimen: Nasopharyngeal Swab  Result Value Ref Range   SARS Coronavirus 2 NEGATIVE NEGATIVE    Comment: (NOTE) SARS-CoV-2 target nucleic acids are NOT DETECTED.  The SARS-CoV-2 RNA is generally detectable in upper and lower respiratory specimens during the acute phase of infection. The lowest concentration of SARS-CoV-2 viral copies this assay can detect is 250 copies / mL. A negative result does not preclude SARS-CoV-2 infection and should not be used as the sole basis for treatment or other patient management decisions.  A negative result may occur with improper specimen collection / handling, submission of specimen other than nasopharyngeal swab, presence of viral mutation(s) within the areas targeted by this assay, and inadequate number of viral copies (<250 copies / mL). A negative result must be combined with clinical observations, patient history, and epidemiological information.  Fact Sheet for Patients:   StrictlyIdeas.no  Fact Sheet for Healthcare Providers: BankingDealers.co.za  This test is not yet approved or  cleared by the [place] FDA and has been authorized for detection and/or diagnosis of SARS-CoV-2 by FDA under an Emergency Use Authorization (EUA).  This EUA will remain in effect (meaning this test can be used) for the duration of the COVID-19 declaration under Section 564(b)(1) of the Act, 21 U.S.C. section 360bbb-3(b)(1), unless the authorization is terminated or revoked sooner.  Performed at [facility], [place] [address] [place], [place] [address]   Glucose, capillary     Status: Abnormal   Collection Time: 08/27/19  9:25 PM  Result Value Ref Range   Glucose-Capillary 360 (H) 70 - 99 mg/dL    Comment: Glucose reference range  applies only to samples taken after fasting for at least 8  hours.  Blood Cultures x 2 sites     Status: None (Preliminary result)   Collection Time: 08/27/19  9:36 PM   Specimen: BLOOD RIGHT WRIST  Result Value Ref Range   Specimen Description BLOOD RIGHT WRIST    Special Requests      BOTTLES DRAWN AEROBIC AND ANAEROBIC Blood Culture adequate volume   Culture      NO GROWTH < 12 HOURS Performed at [facility], [place] [address], [place], [place] [address]    Report Status PENDING   HIV Antibody (routine testing w rflx)     Status: None   Collection Time: 08/27/19  9:42 PM  Result Value Ref Range   HIV Screen 4th Generation wRfx Non Reactive Non Reactive    Comment: Performed at [facility], [place] [address] [place], [place] [address]  Sedimentation rate     Status: Abnormal   Collection Time: 08/27/19  9:42 PM  Result Value Ref Range   Sed Rate 52 (H) 0 - 16 mm/hr    Comment: Performed at [facility] [address], [place], [place] [address]  C-reactive protein     Status: Abnormal   Collection Time: 08/27/19  9:42 PM  Result Value Ref Range   CRP 1.5 (H) <1.0 mg/dL    Comment: Performed at [facility], [place] [address] [place], [place] [address]  Prealbumin     Status: Abnormal   Collection Time: 08/27/19  9:42 PM  Result Value Ref Range   Prealbumin 15.7 (L) 18 - 38 mg/dL    Comment: Performed at [facility] [address] [place], [place] [address]  Hemoglobin A1c     Status: Abnormal   Collection Time: 08/27/19  9:42 PM  Result Value Ref Range   Hgb A1c MFr Bld 10.8 (H) 4.8 - 5.6 %    Comment: (NOTE) Pre diabetes:          5.7%-6.4%  Diabetes:              >6.4%  Glycemic control for   <7.0% adults with diabetes    Mean Plasma Glucose 263.26 mg/dL    Comment: Performed at [facility] [address], [place], [place] [address]  Blood Cultures x 2 sites     Status: None (Preliminary result)   Collection Time: 08/27/19  9:46 PM   Specimen: BLOOD LEFT WRIST  Result Value Ref Range    Specimen Description BLOOD LEFT WRIST    Special Requests      BOTTLES DRAWN AEROBIC AND ANAEROBIC Blood Culture adequate volume   Culture      NO GROWTH < 12 HOURS Performed at [facility], [place] [address], [place], [place] [address]    Report Status PENDING   Urine rapid drug screen (hosp performed)     Status: Abnormal   Collection Time: 08/27/19 11:02 PM  Result Value Ref Range   Opiates POSITIVE (A) NONE DETECTED   Cocaine POSITIVE (A) NONE DETECTED   Benzodiazepines NONE DETECTED NONE DETECTED   Amphetamines NONE DETECTED NONE DETECTED   Tetrahydrocannabinol NONE DETECTED NONE DETECTED   Barbiturates NONE DETECTED NONE DETECTED    Comment: (NOTE) DRUG SCREEN FOR MEDICAL PURPOSES ONLY.  IF CONFIRMATION IS NEEDED FOR ANY PURPOSE, NOTIFY LAB WITHIN 5 DAYS.  LOWEST DETECTABLE LIMITS FOR URINE DRUG SCREEN Drug Class  Cutoff (ng/mL) Amphetamine and metabolites    1000 Barbiturate and metabolites    200 Benzodiazepine                 856 Tricyclics and metabolites     300 Opiates and metabolites        300 Cocaine and metabolites        300 THC                            50 Performed at [facility], [place] [address] [place], [place] [address]   Basic metabolic panel     Status: Abnormal   Collection Time: 08/28/19  5:09 AM  Result Value Ref Range   Sodium 136 135 - 145 mmol/L   Potassium 3.2 (L) 3.5 - 5.1 mmol/L    Comment: NO VISIBLE HEMOLYSIS   Chloride 102 98 - 111 mmol/L   CO2 25 22 - 32 mmol/L   Glucose, Bld 261 (H) 70 - 99 mg/dL    Comment: Glucose reference range applies only to samples taken after fasting for at least 8 hours.   BUN 14 6 - 20 mg/dL   Creatinine, Ser 1.51 (H) 0.61 - 1.24 mg/dL   Calcium 8.6 (L) 8.9 - 10.3 mg/dL   GFR calc non Af Amer 57 (L) >60 mL/min   GFR calc Af Amer >60 >60 mL/min   Anion gap 9 5 - 15    Comment: Performed at [facility] [address], [place], [place] [address]  CBC     Status:  Abnormal   Collection Time: 08/28/19  5:09 AM  Result Value Ref Range   WBC 12.0 (H) 4.0 - 10.5 K/uL   RBC 3.33 (L) 4.22 - 5.81 MIL/uL   Hemoglobin 8.5 (L) 13.0 - 17.0 g/dL   HCT 26.9 (L) 39 - 52 %   MCV 80.8 80.0 - 100.0 fL   MCH 25.5 (L) 26.0 - 34.0 pg   MCHC 31.6 30.0 - 36.0 g/dL   RDW 13.7 11.5 - 15.5 %   Platelets 540 (H) 150 - 400 K/uL   nRBC 0.0 0.0 - 0.2 %    Comment: Performed at [facility], [place] [address] [place], [place] [address]  Glucose, capillary     Status: Abnormal   Collection Time: 08/28/19  6:14 AM  Result Value Ref Range   Glucose-Capillary 219 (H) 70 - 99 mg/dL    Comment: Glucose reference range applies only to samples taken after fasting for at least 8 hours.  Glucose, capillary     Status: None   Collection Time: 08/28/19 12:08 PM  Result Value Ref Range   Glucose-Capillary 72 70 - 99 mg/dL    Comment: Glucose reference range applies only to samples taken after fasting for at least 8 hours.    DG Chest Port 1 View  Result Date: 08/27/2019 CLINICAL DATA:  Shortness of breath and tachycardia. EXAM: PORTABLE CHEST 1 VIEW COMPARISON:  08/20/2019 prior exams FINDINGS: The cardiopericardial silhouette is unchanged. Pulmonary vascular congestion with mild interstitial edema noted. There is no evidence of focal airspace disease, suspicious pulmonary nodule/mass, pleural effusion, or pneumothorax. No acute bony abnormalities are identified. IMPRESSION: Pulmonary vascular congestion with mild interstitial edema. Electronically Signed   By: Margarette Canada M.D.   On: 08/27/2019 13:35   ECHOCARDIOGRAM COMPLETE  Result Date: 08/28/2019    ECHOCARDIOGRAM REPORT   Patient Name:  [name] Date of Exam: 08/28/2019 Medical Rec #:  [id number]    Height:       69.0 in Accession #:    [id number]   Weight:       164.1 lb Date of Birth:  [birth year]    BSA:          1.899 m Patient Age:    31 years     BP:           117/74 mmHg Patient Gender: M            HR:           84  bpm. Exam Location:  Inpatient Procedure: 2D Echo, Cardiac Doppler and Color Doppler Indications:    Endocarditis I38  History:        Patient has prior history of Echocardiogram examinations, most                 recent 05/12/2019. CHF, Signs/Symptoms:Chest Pain; Risk                 Factors:Diabetes and Current Smoker. Endocarditis. Elevated                 troponin. GERD. IV drug abuse.  Sonographer:    Vickie Epley RDCS Referring Phys: [place]  1. Mitral and pulmonic valve vegetations. Endocarditis.  2. Left ventricular ejection fraction, by estimation, is 70 to 75%. The left ventricle has hyperdynamic function. The left ventricle has no regional wall motion abnormalities. Left ventricular diastolic parameters were normal.  3. Right ventricular systolic function is normal. The right ventricular size is normal.  4. There is a anterior mitral leaflet valvular vegetation (1.1 x 0.6 cm). The mitral valve is abnormal. Moderate mitral valve regurgitation. No evidence of mitral stenosis.  5. The aortic valve is normal in structure. Aortic valve regurgitation is mild. No aortic stenosis is present.  6. Possible vegetation on pulmonic valve. The pulmonic valve was abnormal.  7. The inferior vena cava is normal in size with greater than 50% respiratory variability, suggesting right atrial pressure of 3 mmHg. FINDINGS  Left Ventricle: Left ventricular ejection fraction, by estimation, is 70 to 75%. The left ventricle has hyperdynamic function. The left ventricle has no regional wall motion abnormalities. The left ventricular internal cavity size was normal in size. There is no left ventricular hypertrophy. Left ventricular diastolic parameters were normal. Right Ventricle: The right ventricular size is normal. No increase in right ventricular wall thickness. Right ventricular systolic function is normal. Left Atrium: Left atrial size was normal in size. Right Atrium: Right atrial size was normal in  size. Pericardium: There is no evidence of pericardial effusion. Mitral Valve: There is a anterior mitral leaflet valvular vegetation (1.1 x 0.6 cm). The mitral valve is abnormal. There is mild thickening of the mitral valve leaflet(s). Normal mobility of the mitral valve leaflets. Moderate mitral valve regurgitation,  with eccentric laterally directed jet. No evidence of mitral valve stenosis. Tricuspid Valve: The tricuspid valve is normal in structure. Tricuspid valve regurgitation is not demonstrated. No evidence of tricuspid stenosis. Aortic Valve: The aortic valve is normal in structure. Aortic valve regurgitation is mild. Aortic regurgitation PHT measures 390 msec. No aortic stenosis is present. Pulmonic Valve: Possible vegetation on pulmonic valve. The pulmonic valve was abnormal. Pulmonic valve regurgitation is trivial. No evidence of pulmonic stenosis. Aorta: The aortic root is normal in size and structure. Venous: The inferior vena cava is normal in size  with greater than 50% respiratory variability, suggesting right atrial pressure of 3 mmHg. IAS/Shunts: No atrial level shunt detected by color flow Doppler. Additional Comments: Mitral and pulmonic valve vegetations. Endocarditis.  LEFT VENTRICLE PLAX 2D LVIDd:         5.30 cm  Diastology LVIDs:         3.40 cm  LV e' lateral: 11.00 cm/s LV PW:         0.80 cm  LV e' medial:  7.83 cm/s LV IVS:        0.80 cm LVOT diam:     1.90 cm LV SV:         53 LV SV Index:   28 LVOT Area:     2.84 cm  RIGHT VENTRICLE RV S prime:     12.80 cm/s TAPSE (M-mode): 1.8 cm LEFT ATRIUM             Index       RIGHT ATRIUM          Index LA diam:        5.00 cm 2.63 cm/m  RA Area:     7.22 cm LA Vol (A2C):   61.3 ml 32.27 ml/m RA Volume:   10.40 ml 5.48 ml/m LA Vol (A4C):   58.0 ml 30.54 ml/m LA Biplane Vol: 59.7 ml 31.43 ml/m  AORTIC VALVE LVOT Vmax:   99.80 cm/s LVOT Vmean:  68.000 cm/s LVOT VTI:    0.188 m AI PHT:      390 msec  AORTA Ao Root diam: 2.80 cm MR Peak  grad: 100.0 mmHg MR Mean grad: 69.0 mmHg   SHUNTS MR Vmax:      500.00 cm/s Systemic VTI:  0.19 m MR Vmean:     386.0 cm/s  Systemic Diam: 1.90 cm Candee Furbish MD Electronically signed by Candee Furbish MD Signature Date/Time: 08/28/2019/12:20:14 PM    Final    [place] EKG SITE RITE  Result Date: 08/28/2019 If Site Rite image not attached, placement could not be confirmed due to current cardiac rhythm.   Review of Systems  Constitutional: Negative for chills and fever.  HENT: Negative for congestion and sore throat.   Respiratory: Positive for shortness of breath.   Cardiovascular: Positive for chest pain.  Gastrointestinal: Negative for abdominal pain, nausea and vomiting.  Musculoskeletal: Positive for back pain. Negative for joint pain, myalgias and neck pain.  Skin: Negative for itching and rash.   Blood pressure 126/79, pulse 89, temperature 98.4 F (36.9 C), temperature source Oral, resp. rate 18, height 5\' 9"  (1.753 m), weight 74.4 kg, SpO2 96 %. Physical Exam  Vitals reviewed. Constitutional: He is oriented to person, place, and time. He appears well-developed. No distress.  HENT:  Head: Normocephalic and atraumatic.  Cardiovascular: Normal rate.  Respiratory: Effort normal.  Musculoskeletal:       Arms:     Cervical back: Normal range of motion.     Comments: Bilateral forearm wounds, purulent drainage. Scaring present and some areas of firmness of surrounding skin.  Neurological: He is alert and oriented to person, place, and time.  Skin: Skin is warm and dry.  Psychiatric: His behavior is normal. Mood normal.              Assessment/Plan: Patient has bilateral forearm wounds with purulent drainage.  Surrounding skin has some scarring and areas of hardness. Known IVDA/polysubstance abuse. Rapid drug screen 6/10 positive for cocaine and opiates. Echo shows mitral and pulmonic valve vegetations.  Continue doing daily dressing changes consisting of Xeroform, ABD, and wrap with  Kerlix.  Daily dressing change completed for today.  Agree with continuing antibiotics.   Plan to take patient to the OR on Tuesday (6/15) for debridement and placement of Primatrix AG.  Please make n.p.o. after midnight on Monday (6/14).  For optimal wound healing recommend blood sugar levels are well controlled. Hgb A1c yesterday was 10.8 and glucose was 263 mg/dL. Glucose was 219 mg/dL this morning and 72 mg/dL as of noon today (patient has been NPO).  Threasa Heads, PA-C 08/28/2019, 1:25 PM

## 2019-08-28 NOTE — H&P (View-Only) (Signed)
Reason for Consult: bilateral arm wounds Referring Physician: Dr. Karmen Bongo  Luke [name] is an 39 y.o. male.  HPI:  Patient is a 39 yr-old male who presented to the ER on 08/19/19 for evaluation of chest pain and SOB for ~ 1 week and was admitted.  He left AMA and returned on 6/10 for evaluation of SOB.  He has past medical history significant for insulin-dependent diabetes mellitus, hypothyroidism, IVDA/polysubstance abuse, seizures, HTN, endocarditis, stage III CKD, Hx great toe amputation.  He has bilateral forearm nonhealing wounds.  Wounds are fairly similar in appearance to that was observed on 6/4.  He reports the wound on his right arm was debrided approximately 2 months ago but never completely healed.  He denies fever, nausea/vomiting.  Reports his chest pain and shortness of breath has improved slightly since admission.   Past Medical History:  Diagnosis Date  . Anemia   . Anxiety  Dx 2008  . Chronic kidney disease   . Diabetes mellitus without complication ([place]) Dx 3536  . DKA (diabetic ketoacidoses) ([place]) 07/21/2017  . Endocarditis 04/2019  . GERD (gastroesophageal reflux disease) Dx 2008  . Headache(784.0)   . Hypertension   . Hypothyroidism   . Pneumonia   . Seizures ([place]) 2011    x 2 in lifetime. on Dilantin for a while.   . Substance abuse ([place]) 2013    heroin use, multiple relapses  . Type 1 diabetes [facility])     Past Surgical History:  Procedure Laterality Date  . AMPUTATION Left 09/05/2018   Procedure: LEFT GREAT TOE AMPUTATION;  Surgeon: [name], MD;  Location: [place];  Service: Orthopedics;  Laterality: Left;  . I & D EXTREMITY Left 10/11/2012   Procedure: IRRIGATION AND DEBRIDEMENT ABSCESS FOREARM;  Surgeon: Linna Hoff, MD;  Location: [place];  Service: Orthopedics;  Laterality: Left;  . I & D EXTREMITY Left 10/12/2012   Procedure: IRRIGATION AND DEBRIDEMENT FOREARM;  Surgeon: Linna Hoff, MD;  Location: [place];  Service: Orthopedics;  Laterality:  Left;  . I & D EXTREMITY Left 10/14/2012   Procedure: incision and drainage left forearm;  Surgeon: Linna Hoff, MD;  Location: [place];  Service: Orthopedics;  Laterality: Left;  . I & D EXTREMITY Left 10/16/2012   Procedure: IRRIGATION AND DEBRIDEMENT LEFT FOREARM;  Surgeon: Linna Hoff, MD;  Location: [place];  Service: Orthopedics;  Laterality: Left;  . I & D EXTREMITY Left 10/20/2012   Procedure: INCISION AND DRAINAGE AND DEBRIDEMENT LEFT  FOREARM;  Surgeon: Linna Hoff, MD;  Location: [place];  Service: Orthopedics;  Laterality: Left;  . I & D EXTREMITY Right 05/22/2019   Procedure: IRRIGATION AND DEBRIDEMENT RIGHT ARM;  Surgeon: [name], MD;  Location: [place];  Service: Orthopedics;  Laterality: Right;  . INCISION AND DRAINAGE OF WOUND Bilateral 11/29/2017   Procedure: DEBRIDEMENT BILATERAL FEET, DEBRIDEMENT LEFT ANKLE, AND APPLY WOUND VAC;  Surgeon: [name], MD;  Location: [place];  Service: Orthopedics;  Laterality: Bilateral;  . IRRIGATION AND DEBRIDEMENT ABSCESS     Hx: of left arm abscess related to drug use   . TEE WITHOUT CARDIOVERSION N/A 11/28/2017   Procedure: TRANSESOPHAGEAL ECHOCARDIOGRAM (TEE);  Surgeon: Sanda Klein, MD;  Location: [place];  Service: Cardiovascular;  Laterality: N/A;  . TEE WITHOUT CARDIOVERSION N/A 05/12/2019   Procedure: TRANSESOPHAGEAL ECHOCARDIOGRAM (TEE);  Surgeon: Skeet Latch, MD;  Location: [place];  Service: Cardiovascular;  Laterality: N/A;  Family History  Problem Relation Age of Onset  . Diabetes Father   . Heart disease Father   . Mental illness Sister   . Cancer Neg Hx     Social History:  reports that he has been smoking cigarettes. He has a 25.00 pack-year smoking history. He has never used smokeless tobacco. He reports previous drug use. Drugs: Cocaine, IV, and Heroin. He reports that he does not drink alcohol.  Allergies: No Known Allergies  Medications: I have reviewed the patient's current  medications.  Results for orders placed or performed during the hospital encounter of 08/27/19 (from the past 48 hour(s))  CBC with Differential/Platelet     Status: Abnormal   Collection Time: 08/27/19  2:15 PM  Result Value Ref Range   WBC 10.4 4.0 - 10.5 K/uL   RBC 3.31 (L) 4.22 - 5.81 MIL/uL   Hemoglobin 8.5 (L) 13.0 - 17.0 g/dL   HCT 27.9 (L) 39 - 52 %   MCV 84.3 80.0 - 100.0 fL   MCH 25.7 (L) 26.0 - 34.0 pg   MCHC 30.5 30.0 - 36.0 g/dL   RDW 14.0 11.5 - 15.5 %   Platelets 505 (H) 150 - 400 K/uL   nRBC 0.0 0.0 - 0.2 %   Neutrophils Relative % 70 %   Neutro Abs 7.3 1.7 - 7.7 K/uL   Lymphocytes Relative 20 %   Lymphs Abs 2.1 0.7 - 4.0 K/uL   Monocytes Relative 7 %   Monocytes Absolute 0.7 0 - 1 K/uL   Eosinophils Relative 2 %   Eosinophils Absolute 0.2 0 - 0 K/uL   Basophils Relative 1 %   Basophils Absolute 0.1 0 - 0 K/uL   Immature Granulocytes 0 %   Abs Immature Granulocytes 0.04 0.00 - 0.07 K/uL    Comment: Performed at [facility], [address], [place], [place] [address]  Comprehensive metabolic panel     Status: Abnormal   Collection Time: 08/27/19  2:15 PM  Result Value Ref Range   Sodium 136 135 - 145 mmol/L   Potassium 4.3 3.5 - 5.1 mmol/L   Chloride 105 98 - 111 mmol/L   CO2 22 22 - 32 mmol/L   Glucose, Bld 405 (H) 70 - 99 mg/dL    Comment: Glucose reference range applies only to samples taken after fasting for at least 8 hours.   BUN 13 6 - 20 mg/dL   Creatinine, Ser 1.46 (H) 0.61 - 1.24 mg/dL   Calcium 8.5 (L) 8.9 - 10.3 mg/dL   Total Protein 6.3 (L) 6.5 - 8.1 g/dL   Albumin 2.6 (L) 3.5 - 5.0 g/dL   AST 16 15 - 41 U/L   ALT 14 0 - 44 U/L   Alkaline Phosphatase 103 38 - 126 U/L   Total Bilirubin 0.7 0.3 - 1.2 mg/dL   GFR calc non Af Amer 60 (L) >60 mL/min   GFR calc Af Amer >60 >60 mL/min   Anion gap 9 5 - 15    Comment: Performed at [facility] [address] [place], [place] [address]  Troponin I (High Sensitivity)     Status:  Abnormal   Collection Time: 08/27/19  2:15 PM  Result Value Ref Range   Troponin I (High Sensitivity) 25 (H) <18 ng/L    Comment: (NOTE) Elevated high sensitivity troponin I (hsTnI) values and significant  changes across serial measurements may suggest ACS but many other  chronic and acute conditions are  known to elevate hsTnI results.  Refer to the "Links" section for chest pain algorithms and additional  guidance. Performed at [facility], [place] [address] [place], [place] [address]   D-dimer, quantitative (not at [facility])     Status: Abnormal   Collection Time: 08/27/19  2:15 PM  Result Value Ref Range   D-Dimer, Quant 2.88 (H) 0.00 - 0.50 ug/mL-FEU    Comment: (NOTE) At the manufacturer cut-off of 0.50 ug/mL FEU, this assay has been documented to exclude PE with a sensitivity and negative predictive value of 97 to 99%.  At this time, this assay has not been approved by the FDA to exclude DVT/VTE. Results should be correlated with clinical presentation. Performed at [facility], [place] [address] [place], [place] [address]   Brain natriuretic peptide     Status: Abnormal   Collection Time: 08/27/19  2:15 PM  Result Value Ref Range   B Natriuretic Peptide 257.6 (H) 0.0 - 100.0 pg/mL    Comment: Performed at [facility] [address], [place], [place] [address]  Troponin I (High Sensitivity)     Status: Abnormal   Collection Time: 08/27/19  3:35 PM  Result Value Ref Range   Troponin I (High Sensitivity) 29 (H) <18 ng/L    Comment: (NOTE) Elevated high sensitivity troponin I (hsTnI) values and significant  changes across serial measurements may suggest ACS but many other  chronic and acute conditions are known to elevate hsTnI results.  Refer to the "Links" section for chest pain algorithms and additional  guidance. Performed at [facility], [place] [address] [place], [place] [address]   SARS Coronavirus 2 by RT PCR (hospital order, performed in [facility] hospital lab) Nasopharyngeal Nasopharyngeal Swab     Status: None   Collection Time: 08/27/19  5:42 PM   Specimen: Nasopharyngeal Swab  Result Value Ref Range   SARS Coronavirus 2 NEGATIVE NEGATIVE    Comment: (NOTE) SARS-CoV-2 target nucleic acids are NOT DETECTED.  The SARS-CoV-2 RNA is generally detectable in upper and lower respiratory specimens during the acute phase of infection. The lowest concentration of SARS-CoV-2 viral copies this assay can detect is 250 copies / mL. A negative result does not preclude SARS-CoV-2 infection and should not be used as the sole basis for treatment or other patient management decisions.  A negative result may occur with improper specimen collection / handling, submission of specimen other than nasopharyngeal swab, presence of viral mutation(s) within the areas targeted by this assay, and inadequate number of viral copies (<250 copies / mL). A negative result must be combined with clinical observations, patient history, and epidemiological information.  Fact Sheet for Patients:   StrictlyIdeas.no  Fact Sheet for Healthcare Providers: BankingDealers.co.za  This test is not yet approved or  cleared by the [place] FDA and has been authorized for detection and/or diagnosis of SARS-CoV-2 by FDA under an Emergency Use Authorization (EUA).  This EUA will remain in effect (meaning this test can be used) for the duration of the COVID-19 declaration under Section 564(b)(1) of the Act, 21 U.S.C. section 360bbb-3(b)(1), unless the authorization is terminated or revoked sooner.  Performed at [facility], [place] [address], [place], [place] [address]   Glucose, capillary     Status: Abnormal   Collection Time: 08/27/19  9:25 PM  Result Value Ref Range   Glucose-Capillary 360 (H) 70 - 99 mg/dL    Comment: Glucose reference range  applies only to samples taken after fasting for at least 8  hours.  Blood Cultures x 2 sites     Status: None (Preliminary result)   Collection Time: 08/27/19  9:36 PM   Specimen: BLOOD RIGHT WRIST  Result Value Ref Range   Specimen Description BLOOD RIGHT WRIST    Special Requests      BOTTLES DRAWN AEROBIC AND ANAEROBIC Blood Culture adequate volume   Culture      NO GROWTH < 12 HOURS Performed at [facility], [place] [address], [place], [place] [address]    Report Status PENDING   HIV Antibody (routine testing w rflx)     Status: None   Collection Time: 08/27/19  9:42 PM  Result Value Ref Range   HIV Screen 4th Generation wRfx Non Reactive Non Reactive    Comment: Performed at [facility], [place] [address], [place], [place] [address]  Sedimentation rate     Status: Abnormal   Collection Time: 08/27/19  9:42 PM  Result Value Ref Range   Sed Rate 52 (H) 0 - 16 mm/hr    Comment: Performed at [facility] [address], [place], [place] [address]  C-reactive protein     Status: Abnormal   Collection Time: 08/27/19  9:42 PM  Result Value Ref Range   CRP 1.5 (H) <1.0 mg/dL    Comment: Performed at [facility], [place] [address], [place], [place] [address]  Prealbumin     Status: Abnormal   Collection Time: 08/27/19  9:42 PM  Result Value Ref Range   Prealbumin 15.7 (L) 18 - 38 mg/dL    Comment: Performed at [facility] [address] [place], [place] [address]  Hemoglobin A1c     Status: Abnormal   Collection Time: 08/27/19  9:42 PM  Result Value Ref Range   Hgb A1c MFr Bld 10.8 (H) 4.8 - 5.6 %    Comment: (NOTE) Pre diabetes:          5.7%-6.4%  Diabetes:              >6.4%  Glycemic control for   <7.0% adults with diabetes    Mean Plasma Glucose 263.26 mg/dL    Comment: Performed at [facility] [address] [place], [place] [address]  Blood Cultures x 2 sites     Status: None (Preliminary result)   Collection Time: 08/27/19  9:46 PM   Specimen: BLOOD LEFT WRIST  Result Value Ref Range    Specimen Description BLOOD LEFT WRIST    Special Requests      BOTTLES DRAWN AEROBIC AND ANAEROBIC Blood Culture adequate volume   Culture      NO GROWTH < 12 HOURS Performed at [facility], [place] [address] [place], [place] [address]    Report Status PENDING   Urine rapid drug screen (hosp performed)     Status: Abnormal   Collection Time: 08/27/19 11:02 PM  Result Value Ref Range   Opiates POSITIVE (A) NONE DETECTED   Cocaine POSITIVE (A) NONE DETECTED   Benzodiazepines NONE DETECTED NONE DETECTED   Amphetamines NONE DETECTED NONE DETECTED   Tetrahydrocannabinol NONE DETECTED NONE DETECTED   Barbiturates NONE DETECTED NONE DETECTED    Comment: (NOTE) DRUG SCREEN FOR MEDICAL PURPOSES ONLY.  IF CONFIRMATION IS NEEDED FOR ANY PURPOSE, NOTIFY LAB WITHIN 5 DAYS.  LOWEST DETECTABLE LIMITS FOR URINE DRUG SCREEN Drug Class  Cutoff (ng/mL) Amphetamine and metabolites    1000 Barbiturate and metabolites    200 Benzodiazepine                 235 Tricyclics and metabolites     300 Opiates and metabolites        300 Cocaine and metabolites        300 THC                            50 Performed at [facility], [place] [address], [place], [place] [address]   Basic metabolic panel     Status: Abnormal   Collection Time: 08/28/19  5:09 AM  Result Value Ref Range   Sodium 136 135 - 145 mmol/L   Potassium 3.2 (L) 3.5 - 5.1 mmol/L    Comment: NO VISIBLE HEMOLYSIS   Chloride 102 98 - 111 mmol/L   CO2 25 22 - 32 mmol/L   Glucose, Bld 261 (H) 70 - 99 mg/dL    Comment: Glucose reference range applies only to samples taken after fasting for at least 8 hours.   BUN 14 6 - 20 mg/dL   Creatinine, Ser 1.51 (H) 0.61 - 1.24 mg/dL   Calcium 8.6 (L) 8.9 - 10.3 mg/dL   GFR calc non Af Amer 57 (L) >60 mL/min   GFR calc Af Amer >60 >60 mL/min   Anion gap 9 5 - 15    Comment: Performed at [facility] [address] [place], [place] [address]  CBC     Status:  Abnormal   Collection Time: 08/28/19  5:09 AM  Result Value Ref Range   WBC 12.0 (H) 4.0 - 10.5 K/uL   RBC 3.33 (L) 4.22 - 5.81 MIL/uL   Hemoglobin 8.5 (L) 13.0 - 17.0 g/dL   HCT 26.9 (L) 39 - 52 %   MCV 80.8 80.0 - 100.0 fL   MCH 25.5 (L) 26.0 - 34.0 pg   MCHC 31.6 30.0 - 36.0 g/dL   RDW 13.7 11.5 - 15.5 %   Platelets 540 (H) 150 - 400 K/uL   nRBC 0.0 0.0 - 0.2 %    Comment: Performed at [facility], [place] [address], [place], [place] [address]  Glucose, capillary     Status: Abnormal   Collection Time: 08/28/19  6:14 AM  Result Value Ref Range   Glucose-Capillary 219 (H) 70 - 99 mg/dL    Comment: Glucose reference range applies only to samples taken after fasting for at least 8 hours.  Glucose, capillary     Status: None   Collection Time: 08/28/19 12:08 PM  Result Value Ref Range   Glucose-Capillary 72 70 - 99 mg/dL    Comment: Glucose reference range applies only to samples taken after fasting for at least 8 hours.    DG Chest Port 1 View  Result Date: 08/27/2019 CLINICAL DATA:  Shortness of breath and tachycardia. EXAM: PORTABLE CHEST 1 VIEW COMPARISON:  08/20/2019 prior exams FINDINGS: The cardiopericardial silhouette is unchanged. Pulmonary vascular congestion with mild interstitial edema noted. There is no evidence of focal airspace disease, suspicious pulmonary nodule/mass, pleural effusion, or pneumothorax. No acute bony abnormalities are identified. IMPRESSION: Pulmonary vascular congestion with mild interstitial edema. Electronically Signed   By: Margarette Canada M.D.   On: 08/27/2019 13:35   ECHOCARDIOGRAM COMPLETE  Result Date: 08/28/2019    ECHOCARDIOGRAM REPORT   Patient Name:  Luke [name] Date of Exam: 08/28/2019 Medical Rec #:  [id number]    Height:       69.0 in Accession #:    [id number]   Weight:       164.1 lb Date of Birth:  [birth year]    BSA:          1.899 m Patient Age:    52 years     BP:           117/74 mmHg Patient Gender: M            HR:           84  bpm. Exam Location:  Inpatient Procedure: 2D Echo, Cardiac Doppler and Color Doppler Indications:    Endocarditis I38  History:        Patient has prior history of Echocardiogram examinations, most                 recent 05/12/2019. CHF, Signs/Symptoms:Chest Pain; Risk                 Factors:Diabetes and Current Smoker. Endocarditis. Elevated                 troponin. GERD. IV drug abuse.  Sonographer:    Vickie Epley RDCS Referring Phys: [place]  1. Mitral and pulmonic valve vegetations. Endocarditis.  2. Left ventricular ejection fraction, by estimation, is 70 to 75%. The left ventricle has hyperdynamic function. The left ventricle has no regional wall motion abnormalities. Left ventricular diastolic parameters were normal.  3. Right ventricular systolic function is normal. The right ventricular size is normal.  4. There is a anterior mitral leaflet valvular vegetation (1.1 x 0.6 cm). The mitral valve is abnormal. Moderate mitral valve regurgitation. No evidence of mitral stenosis.  5. The aortic valve is normal in structure. Aortic valve regurgitation is mild. No aortic stenosis is present.  6. Possible vegetation on pulmonic valve. The pulmonic valve was abnormal.  7. The inferior vena cava is normal in size with greater than 50% respiratory variability, suggesting right atrial pressure of 3 mmHg. FINDINGS  Left Ventricle: Left ventricular ejection fraction, by estimation, is 70 to 75%. The left ventricle has hyperdynamic function. The left ventricle has no regional wall motion abnormalities. The left ventricular internal cavity size was normal in size. There is no left ventricular hypertrophy. Left ventricular diastolic parameters were normal. Right Ventricle: The right ventricular size is normal. No increase in right ventricular wall thickness. Right ventricular systolic function is normal. Left Atrium: Left atrial size was normal in size. Right Atrium: Right atrial size was normal in  size. Pericardium: There is no evidence of pericardial effusion. Mitral Valve: There is a anterior mitral leaflet valvular vegetation (1.1 x 0.6 cm). The mitral valve is abnormal. There is mild thickening of the mitral valve leaflet(s). Normal mobility of the mitral valve leaflets. Moderate mitral valve regurgitation,  with eccentric laterally directed jet. No evidence of mitral valve stenosis. Tricuspid Valve: The tricuspid valve is normal in structure. Tricuspid valve regurgitation is not demonstrated. No evidence of tricuspid stenosis. Aortic Valve: The aortic valve is normal in structure. Aortic valve regurgitation is mild. Aortic regurgitation PHT measures 390 msec. No aortic stenosis is present. Pulmonic Valve: Possible vegetation on pulmonic valve. The pulmonic valve was abnormal. Pulmonic valve regurgitation is trivial. No evidence of pulmonic stenosis. Aorta: The aortic root is normal in size and structure. Venous: The inferior vena cava is normal in size  with greater than 50% respiratory variability, suggesting right atrial pressure of 3 mmHg. IAS/Shunts: No atrial level shunt detected by color flow Doppler. Additional Comments: Mitral and pulmonic valve vegetations. Endocarditis.  LEFT VENTRICLE PLAX 2D LVIDd:         5.30 cm  Diastology LVIDs:         3.40 cm  LV e' lateral: 11.00 cm/s LV PW:         0.80 cm  LV e' medial:  7.83 cm/s LV IVS:        0.80 cm LVOT diam:     1.90 cm LV SV:         53 LV SV Index:   28 LVOT Area:     2.84 cm  RIGHT VENTRICLE RV S prime:     12.80 cm/s TAPSE (M-mode): 1.8 cm LEFT ATRIUM             Index       RIGHT ATRIUM          Index LA diam:        5.00 cm 2.63 cm/m  RA Area:     7.22 cm LA Vol (A2C):   61.3 ml 32.27 ml/m RA Volume:   10.40 ml 5.48 ml/m LA Vol (A4C):   58.0 ml 30.54 ml/m LA Biplane Vol: 59.7 ml 31.43 ml/m  AORTIC VALVE LVOT Vmax:   99.80 cm/s LVOT Vmean:  68.000 cm/s LVOT VTI:    0.188 m AI PHT:      390 msec  AORTA Ao Root diam: 2.80 cm MR Peak  grad: 100.0 mmHg MR Mean grad: 69.0 mmHg   SHUNTS MR Vmax:      500.00 cm/s Systemic VTI:  0.19 m MR Vmean:     386.0 cm/s  Systemic Diam: 1.90 cm Candee Furbish MD Electronically signed by Candee Furbish MD Signature Date/Time: 08/28/2019/12:20:14 PM    Final    [place] EKG SITE RITE  Result Date: 08/28/2019 If Site Rite image not attached, placement could not be confirmed due to current cardiac rhythm.   Review of Systems  Constitutional: Negative for chills and fever.  HENT: Negative for congestion and sore throat.   Respiratory: Positive for shortness of breath.   Cardiovascular: Positive for chest pain.  Gastrointestinal: Negative for abdominal pain, nausea and vomiting.  Musculoskeletal: Positive for back pain. Negative for joint pain, myalgias and neck pain.  Skin: Negative for itching and rash.   Blood pressure 126/79, pulse 89, temperature 98.4 F (36.9 C), temperature source Oral, resp. rate 18, height 5\' 9"  (1.753 m), weight 74.4 kg, SpO2 96 %. Physical Exam  Vitals reviewed. Constitutional: He is oriented to person, place, and time. He appears well-developed. No distress.  HENT:  Head: Normocephalic and atraumatic.  Cardiovascular: Normal rate.  Respiratory: Effort normal.  Musculoskeletal:       Arms:     Cervical back: Normal range of motion.     Comments: Bilateral forearm wounds, purulent drainage. Scaring present and some areas of firmness of surrounding skin.  Neurological: He is alert and oriented to person, place, and time.  Skin: Skin is warm and dry.  Psychiatric: His behavior is normal. Mood normal.              Assessment/Plan: Patient has bilateral forearm wounds with purulent drainage.  Surrounding skin has some scarring and areas of hardness. Known IVDA/polysubstance abuse. Rapid drug screen 6/10 positive for cocaine and opiates. Echo shows mitral and pulmonic valve vegetations.  Continue doing daily dressing changes consisting of Xeroform, ABD, and wrap with  Kerlix.  Daily dressing change completed for today.  Agree with continuing antibiotics.   Plan to take patient to the OR on Tuesday (6/15) for debridement and placement of Primatrix AG.  Please make n.p.o. after midnight on Monday (6/14).  For optimal wound healing recommend blood sugar levels are well controlled. Hgb A1c yesterday was 10.8 and glucose was 263 mg/dL. Glucose was 219 mg/dL this morning and 72 mg/dL as of noon today (patient has been NPO).  Threasa Heads, PA-C 08/28/2019, 1:25 PM

## 2019-08-29 LAB — COMPREHENSIVE METABOLIC PANEL
ALT: 12 U/L (ref 0–44)
AST: 13 U/L — ABNORMAL LOW (ref 15–41)
Albumin: 2.6 g/dL — ABNORMAL LOW (ref 3.5–5.0)
Alkaline Phosphatase: 96 U/L (ref 38–126)
Anion gap: 9 (ref 5–15)
BUN: 15 mg/dL (ref 6–20)
CO2: 21 mmol/L — ABNORMAL LOW (ref 22–32)
Calcium: 8.7 mg/dL — ABNORMAL LOW (ref 8.9–10.3)
Chloride: 104 mmol/L (ref 98–111)
Creatinine, Ser: 1.4 mg/dL — ABNORMAL HIGH (ref 0.61–1.24)
GFR calc Af Amer: >60 mL/min (ref >60)
GFR calc non Af Amer: >60 mL/min (ref >60)
Glucose, Bld: 224 mg/dL — ABNORMAL HIGH (ref 70–99)
Potassium: 3.7 mmol/L (ref 3.5–5.1)
Sodium: 134 mmol/L — ABNORMAL LOW (ref 135–145)
Total Bilirubin: 0.4 mg/dL (ref 0.3–1.2)
Total Protein: 6.1 g/dL — ABNORMAL LOW (ref 6.5–8.1)

## 2019-08-29 LAB — GLUCOSE, CAPILLARY
Glucose-Capillary: 229 mg/dL — ABNORMAL HIGH (ref 70–99)
Glucose-Capillary: 57 mg/dL — ABNORMAL LOW (ref 70–99)
Glucose-Capillary: 109 mg/dL — ABNORMAL HIGH (ref 70–99)
Glucose-Capillary: 247 mg/dL — ABNORMAL HIGH (ref 70–99)
Glucose-Capillary: 351 mg/dL — ABNORMAL HIGH (ref 70–99)

## 2019-08-29 LAB — CBC
HCT: 27.1 % — ABNORMAL LOW (ref 39.0–52.0)
Hemoglobin: 8.7 g/dL — ABNORMAL LOW (ref 13.0–17.0)
MCH: 25.5 pg — ABNORMAL LOW (ref 26.0–34.0)
MCHC: 32.1 g/dL (ref 30.0–36.0)
MCV: 79.5 fL — ABNORMAL LOW (ref 80.0–100.0)
Platelets: 530 10*3/uL — ABNORMAL HIGH (ref 150–400)
RBC: 3.41 MIL/uL — ABNORMAL LOW (ref 4.22–5.81)
RDW: 13.7 % (ref 11.5–15.5)
WBC: 14.2 10*3/uL — ABNORMAL HIGH (ref 4.0–10.5)
nRBC: 0 % (ref 0.0–0.2)

## 2019-08-29 MED ORDER — INSULIN GLARGINE 100 UNIT/ML ~~LOC~~ SOLN
45.0000 [IU] | Freq: Every day | SUBCUTANEOUS | Status: DC
  Administered 2019-08-29 – 2019-08-31 (×3): 45 [IU] via SUBCUTANEOUS
  Filled 2019-08-29 (×4): qty 0.45

## 2019-08-29 NOTE — Progress Notes (Signed)
Brief cardiology progress note:  Spoke with Dr. [place] Pink. He had spoken with ID, who recommended cardiology +/- CT surgery evaluation as he now has moderate MR from endocarditis.  Reviewed the case with Dr. Maryland Pink. MV vegetation measures smaller than prior but over 1 cm. He was previously seen by Dr. Kipp Brood on 05/13/19, who recommended at that time medical therapy, drug rehab, and re-evaluation. Noted to be at very high risk of prosthetic valve endocarditis given comorbid conditions.  From a cardiology standpoint, there is no additional suggestion for management for moderate MR. If he develops severe MR, he may experience heart failure symptoms, and if this occurs we would be happy to assist. The size of the mitral vegetation was larger when he was initially evaluated by Dr. Kipp Brood in February. Though it is >1 cm, it is smaller in size.  My suggestion would be to proceed with the debridement surgery as scheduled. After that, if the source is felt to be contained, would be reasonable to discuss surgery again with CT surgery, though I suspect recommendations will be the same.  We are available if additional questions arise.  Buford Dresser, MD, PhD [facility]  [address] [place], [place] [address] [phone]

## 2019-08-29 NOTE — Progress Notes (Signed)
[facility] PROGRESS NOTE   PIERSON VANTOL XTK:[id number] DOB: 10/03/1980 DOA: 08/27/2019  PCP: Gildardo Pounds, NP  Brief History/Interval Summary: 39 y.o. male with medical history significant of type 1 DM; IVDA/polysubstance abuse; seizures; PVD s/p L great toe amputation; hypothyroidism; HTN; endocarditis; and stage 3 CKD presenting with SOB.  Of note, patient presented to the ER on 6/3 for L arm cellulitis with deep ulcer but no osteo; plastic surgery recommended surgical intervention and the patient left AMA on 6/4.  He reports that he "couldn't breathe at all."  He was driving and had to pull over and his friend brought him to the hospital.  He didn't think he was going to make it.  He had substernal chest pain and it is still there.  He was driving when it started.  Nothing makes it better or worse.  Cough during episode, better now.  No fevers.  This is not similar to his prior endocarditis - this was much more severe.  He uses cocaine and heroin.  He uses heroin daily.  The Lasix made him pee a lot and his breathing improved.  Last cocaine use was the day prior to admission.   Reason for Visit: Left arm cellulitis.  Concern for an infective endocarditis  Consultants:  Infectious disease Phone conversation with cardiology  Procedures:   Transthoracic echocardiogram IMPRESSIONS  1. Mitral and pulmonic valve vegetations. Endocarditis.  2. Left ventricular ejection fraction, by estimation, is 70 to 75%. The  left ventricle has hyperdynamic function. The left ventricle has no  regional wall motion abnormalities. Left ventricular diastolic parameters  were normal.  3. Right ventricular systolic function is normal. The right ventricular  size is normal.  4. There is a anterior mitral leaflet valvular vegetation (1.1 x 0.6 cm).  The mitral valve is abnormal. Moderate mitral valve regurgitation. No  evidence of mitral stenosis.  5. The aortic valve is normal in structure.  Aortic valve regurgitation is  mild. No aortic stenosis is present.  6. Possible vegetation on pulmonic valve. The pulmonic valve was  abnormal.  7. The inferior vena cava is normal in size with greater than 50%  respiratory variability, suggesting right atrial pressure of 3 mmHg.    Antibiotics: Anti-infectives (From admission, onward)   Start     Dose/Rate Route Frequency Ordered Stop   08/28/19 0630  vancomycin (VANCOCIN) IVPB 1000 mg/200 mL premix     Discontinue     1,000 mg 200 mL/hr over 60 Minutes Intravenous Every 12 hours 08/27/19 1742     08/27/19 1800  vancomycin (VANCOREADY) IVPB 1500 mg/300 mL        1,500 mg 150 mL/hr over 120 Minutes Intravenous  Once 08/27/19 1742 08/27/19 2230   08/27/19 1800  ceFEPIme (MAXIPIME) 2 g in sodium chloride 0.9 % 100 mL IVPB     Discontinue     2 g 200 mL/hr over 30 Minutes Intravenous Every 8 hours 08/27/19 1742        Subjective/Interval History: Patient continues to have some chest discomfort in the retrosternal area.  4 out of 10 in intensity but he is lying comfortably on the bed.  He refuses to wear the telemetry leads.  I discussed this with him when he continues to refuse.    ROS: No nausea or vomiting    Assessment/Plan:  Infective endocarditis Patient admitted back in February for infective endocarditis.  However he left AGAINST MEDICAL ADVICE after he spent about 8 days in  the hospital.  Blood cultures at that time grew Streptococcus mitis/oralis.  Patient presented on June 3 and again left AGAINST MEDICAL ADVICE.   Echocardiogram once again shows evidence for mitral valve vegetation with moderate regurgitation.  Possible vegetation also noted on the pulmonic valve.  Ejection fraction is noted to be normal.   Continue broad-spectrum antibiotics with vancomycin and cefepime for now.  Waiting on blood cultures.  Infectious diseases following. Discussed with Dr. Harrell Gave with cardiology regarding his echo findings.  MR is  not severe enough for any current intervention at this time.  May need to [facility] cardiothoracic surgery at some point in time in the next week or so but does not again need any urgent intervention currently.  The size of his vegetation has not changed much compared to the TEE report from February.  Of note patient was seen by Dr. Kipp Brood with cardiothoracic surgery back in February and at that time he was not a good candidate for surgery.  Based on patient's lifestyle does not appear like he is a candidate even now.  Necrosis of the right forearm Patient has wounds in both of his forearms right greater than left.  There is evidence for purulent wound involving the right forearm.  Skin necrosis is also noted.  He was scheduled to go to the operating room on June 7 for plastic surgery to be done by Dr. Claudia Desanctis but apparently patient did not show up.  Plastic surgery has been reconsulted.  It looks like the plan is for surgery on Tuesday.  Diabetes mellitus type 1, uncontrolled HbA1c 10.8.  Patient currently on Lantus insulin.  CBGs not very well controlled.  Will adjust the dose.  Chronic kidney disease stage IIIa Seems to be close to his baseline renal function.  Monitor urine output.  Potassium is normal today.    Ulcer involving the left second toe Patient with prior history of left first toe amputation.  Wound care.  History of IV drug abuse/polysubstance abuse He has longstanding heroin dependence.  Patient was previously on Suboxone but he is not interested in this treatment as of now.  Continue to monitor for withdrawal.  He is on clonidine.  Normocytic anemia Likely due to endocarditis, chronic disease, IV drug use.  No evidence for overt bleeding.  Substance induced mood disorder Continue home medications  History of hepatitis C HCV quant was 5,170,000 on 2/24.  And ID follow-up.  Essential hypertension Patient noted to be on amlodipine and carvedilol.  Blood pressure is  reasonably well controlled.  Tobacco dependence Cessation was encouraged.  Nicotine patch.  Hypothyroidism Continue levothyroxine.  Hyperlipidemia Continue Lipitor   DVT Prophylaxis: SCDs Code Status: Full code Family Communication: Discussed with the patient Disposition Plan:  Status is: Inpatient  Remains inpatient appropriate because:IV treatments appropriate due to intensity of illness or inability to take PO   Dispo:  Patient From: Home  Planned Disposition: Home  Expected discharge date: 09/03/19  Medically stable for discharge: No     Medications:  Scheduled: . amLODipine  5 mg Oral Daily  . atorvastatin  20 mg Oral Daily  . carvedilol  12.5 mg Oral BID WC  . cloNIDine  0.1 mg Oral QID   Followed by  . [START ON 08/30/2019] cloNIDine  0.1 mg Oral BH-qamhs   Followed by  . [START ON 09/01/2019] cloNIDine  0.1 mg Oral QAC breakfast  . docusate sodium  100 mg Oral BID  . DULoxetine  30 mg Oral Daily  .  gabapentin  400 mg Oral TID  . insulin aspart  0-15 Units Subcutaneous TID WC  . insulin aspart  0-5 Units Subcutaneous QHS  . insulin glargine  40 Units Subcutaneous QHS  . levothyroxine  50 mcg Oral Q0600  . multivitamin with minerals  1 tablet Oral Daily  . nicotine  14 mg Transdermal Daily  . nutrition supplement (JUVEN)  1 packet Oral BID BM  . pantoprazole  40 mg Oral Daily  . Ensure Max Protein  11 oz Oral QHS  . sertraline  50 mg Oral Daily  . sodium chloride flush  10-40 mL Intracatheter Q12H  . sodium chloride flush  3 mL Intravenous Q12H  . traZODone  50 mg Oral QHS   Continuous: . ceFEPime (MAXIPIME) IV 2 g (08/29/19 0622)  . vancomycin 1,000 mg (08/29/19 4158)   XEN:[id number] **OR** acetaminophen, dicyclomine, hydrALAZINE, hydrOXYzine, loperamide, melatonin, methocarbamol, morphine injection, naproxen, ondansetron **OR** ondansetron (ZOFRAN) IV, ondansetron, polyethylene glycol, sodium chloride flush   Objective:  Vital  Signs  Vitals:   08/28/19 1622 08/28/19 1955 08/28/19 2150 08/29/19 0444  BP: 132/84 127/77 117/69 117/71  Pulse: 100 85 85 87  Resp: 20 20  18   Temp: 98.9 F (37.2 C) 99.5 F (37.5 C)  98.8 F (37.1 C)  TempSrc: Oral Oral  Oral  SpO2: 98% 100%  98%  Weight:      Height:        Intake/Output Summary (Last 24 hours) at 08/29/2019 1102 Last data filed at 08/29/2019 0859 Gross per 24 hour  Intake 1158.02 ml  Output 900 ml  Net 258.02 ml   Filed Weights   08/27/19 1244 08/27/19 1900 08/28/19 0406  Weight: 81.6 kg 81.6 kg 74.4 kg    General appearance: Awake alert.  In no distress Resp: Clear to auscultation bilaterally.  Normal effort.  No crackles appreciated Cardio: S1-S2 is normal regular.  No S3-S4.  Loud systolic murmur over the precordium GI: Abdomen is soft.  Nontender nondistended.  Bowel sounds are present normal.  No masses organomegaly Extremities: Dressing noted over both forearms. Neurologic: Alert and oriented x3.  No focal neurological deficits.      Lab Results:  Data Reviewed: I have personally reviewed following labs and imaging studies  CBC: Recent Labs  Lab 08/27/19 1415 08/28/19 0509 08/29/19 0625  WBC 10.4 12.0* 14.2*  NEUTROABS 7.3  --   --   HGB 8.5* 8.5* 8.7*  HCT 27.9* 26.9* 27.1*  MCV 84.3 80.8 79.5*  PLT 505* 540* 530*    Basic Metabolic Panel: Recent Labs  Lab 08/27/19 1415 08/28/19 0509 08/29/19 0625  NA 136 136 134*  K 4.3 3.2* 3.7  CL 105 102 104  CO2 22 25 21*  GLUCOSE 405* 261* 224*  BUN 13 14 15   CREATININE 1.46* 1.51* 1.40*  CALCIUM 8.5* 8.6* 8.7*    GFR: Estimated Creatinine Clearance: 70.8 mL/min (A) (by C-G formula based on SCr of 1.4 mg/dL (H)).  Liver Function Tests: Recent Labs  Lab 08/27/19 1415 08/29/19 0625  AST 16 13*  ALT 14 12  ALKPHOS 103 96  BILITOT 0.7 0.4  PROT 6.3* 6.1*  ALBUMIN 2.6* 2.6*    HbA1C: Recent Labs    08/27/19 2142  HGBA1C 10.8*    CBG: Recent Labs  Lab  08/28/19 0614 08/28/19 1208 08/28/19 1624 08/28/19 2144 08/29/19 0622  GLUCAP 219* 72 317* 194* 229*      Recent Results (from the past 240 hour(s))  Culture, blood (  Routine x 2)     Status: None   Collection Time: 08/19/19 10:55 PM   Specimen: BLOOD  Result Value Ref Range Status   Specimen Description BLOOD RIGHT ARM  Final   Special Requests   Final    BOTTLES DRAWN AEROBIC AND ANAEROBIC Blood Culture adequate volume   Culture   Final    NO GROWTH 5 DAYS Performed at [facility], [address] [place], [place] [address]    Report Status 08/25/2019 FINAL  Final  Culture, blood (Routine x 2)     Status: None   Collection Time: 08/19/19 11:09 PM   Specimen: BLOOD  Result Value Ref Range Status   Specimen Description BLOOD LEFT ARM  Final   Special Requests   Final    BOTTLES DRAWN AEROBIC ONLY Blood Culture adequate volume   Culture   Final    NO GROWTH 5 DAYS Performed at [facility], [address], [place], [place] [address]    Report Status 08/25/2019 FINAL  Final  SARS Coronavirus 2 by RT PCR (hospital order, performed in [facility] hospital lab) Nasopharyngeal Nasopharyngeal Swab     Status: None   Collection Time: 08/20/19  9:10 AM   Specimen: Nasopharyngeal Swab  Result Value Ref Range Status   SARS Coronavirus 2 NEGATIVE NEGATIVE Final    Comment: (NOTE) SARS-CoV-2 target nucleic acids are NOT DETECTED. The SARS-CoV-2 RNA is generally detectable in upper and lower respiratory specimens during the acute phase of infection. The lowest concentration of SARS-CoV-2 viral copies this assay can detect is 250 copies / mL. A negative result does not preclude SARS-CoV-2 infection and should not be used as the sole basis for treatment or other patient management decisions.  A negative result may occur with improper specimen collection / handling, submission of specimen other than nasopharyngeal swab, presence of viral mutation(s) within the areas  targeted by this assay, and inadequate number of viral copies (<250 copies / mL). A negative result must be combined with clinical observations, patient history, and epidemiological information. Fact Sheet for Patients:   StrictlyIdeas.no Fact Sheet for Healthcare Providers: BankingDealers.co.za This test is not yet approved or cleared  by the [place] FDA and has been authorized for detection and/or diagnosis of SARS-CoV-2 by FDA under an Emergency Use Authorization (EUA).  This EUA will remain in effect (meaning this test can be used) for the duration of the COVID-19 declaration under Section 564(b)(1) of the Act, 21 U.S.C. section 360bbb-3(b)(1), unless the authorization is terminated or revoked sooner. Performed at [facility], [place] [address] [place], [place] [address]   SARS Coronavirus 2 by RT PCR (hospital order, performed in [facility] hospital lab) Nasopharyngeal Nasopharyngeal Swab     Status: None   Collection Time: 08/27/19  5:42 PM   Specimen: Nasopharyngeal Swab  Result Value Ref Range Status   SARS Coronavirus 2 NEGATIVE NEGATIVE Final    Comment: (NOTE) SARS-CoV-2 target nucleic acids are NOT DETECTED.  The SARS-CoV-2 RNA is generally detectable in upper and lower respiratory specimens during the acute phase of infection. The lowest concentration of SARS-CoV-2 viral copies this assay can detect is 250 copies / mL. A negative result does not preclude SARS-CoV-2 infection and should not be used as the sole basis for treatment or other patient management decisions.  A negative result may occur with improper specimen collection / handling, submission of specimen other than nasopharyngeal swab, presence of viral mutation(s) within the  areas targeted by this assay, and inadequate number of viral copies (<250 copies / mL). A negative result must be combined with clinical observations, patient history, and  epidemiological information.  Fact Sheet for Patients:   StrictlyIdeas.no  Fact Sheet for Healthcare Providers: BankingDealers.co.za  This test is not yet approved or  cleared by the [place] FDA and has been authorized for detection and/or diagnosis of SARS-CoV-2 by FDA under an Emergency Use Authorization (EUA).  This EUA will remain in effect (meaning this test can be used) for the duration of the COVID-19 declaration under Section 564(b)(1) of the Act, 21 U.S.C. section 360bbb-3(b)(1), unless the authorization is terminated or revoked sooner.  Performed at [facility], [place] [address], [place], [name] [address]   Blood Cultures x 2 sites     Status: None (Preliminary result)   Collection Time: 08/27/19  9:36 PM   Specimen: BLOOD RIGHT WRIST  Result Value Ref Range Status   Specimen Description BLOOD RIGHT WRIST  Final   Special Requests   Final    BOTTLES DRAWN AEROBIC AND ANAEROBIC Blood Culture adequate volume   Culture   Final    NO GROWTH 2 DAYS Performed at [facility], [place] [address] [place], [place] [address]    Report Status PENDING  Incomplete  Blood Cultures x 2 sites     Status: None (Preliminary result)   Collection Time: 08/27/19  9:46 PM   Specimen: BLOOD LEFT WRIST  Result Value Ref Range Status   Specimen Description BLOOD LEFT WRIST  Final   Special Requests   Final    BOTTLES DRAWN AEROBIC AND ANAEROBIC Blood Culture adequate volume   Culture   Final    NO GROWTH 2 DAYS Performed at [facility], [place] [address], [place], [place] [address]    Report Status PENDING  Incomplete      Radiology Studies: DG Chest Port 1 View  Result Date: 08/27/2019 CLINICAL DATA:  Shortness of breath and tachycardia. EXAM: PORTABLE CHEST 1 VIEW COMPARISON:  08/20/2019 prior exams FINDINGS: The cardiopericardial silhouette is unchanged. Pulmonary vascular congestion with mild interstitial edema  noted. There is no evidence of focal airspace disease, suspicious pulmonary nodule/mass, pleural effusion, or pneumothorax. No acute bony abnormalities are identified. IMPRESSION: Pulmonary vascular congestion with mild interstitial edema. Electronically Signed   By: Margarette Canada M.D.   On: 08/27/2019 13:35   ECHOCARDIOGRAM COMPLETE  Result Date: 08/28/2019    ECHOCARDIOGRAM REPORT   Patient Name:   ANOTHONY BURSCH Date of Exam: 08/28/2019 Medical Rec #:  [id number]    Height:       69.0 in Accession #:    [id number]   Weight:       164.1 lb Date of Birth:  01-Jan-1981    BSA:          1.899 m Patient Age:    25 years     BP:           117/74 mmHg Patient Gender: M            HR:           84 bpm. Exam Location:  Inpatient Procedure: 2D Echo, Cardiac Doppler and Color Doppler Indications:    Endocarditis I38  History:        Patient has prior history of Echocardiogram examinations, most                 recent 05/12/2019. CHF,  Signs/Symptoms:Chest Pain; Risk                 Factors:Diabetes and Current Smoker. Endocarditis. Elevated                 troponin. GERD. IV drug abuse.  Sonographer:    Vickie Epley RDCS Referring Phys: [place]  1. Mitral and pulmonic valve vegetations. Endocarditis.  2. Left ventricular ejection fraction, by estimation, is 70 to 75%. The left ventricle has hyperdynamic function. The left ventricle has no regional wall motion abnormalities. Left ventricular diastolic parameters were normal.  3. Right ventricular systolic function is normal. The right ventricular size is normal.  4. There is a anterior mitral leaflet valvular vegetation (1.1 x 0.6 cm). The mitral valve is abnormal. Moderate mitral valve regurgitation. No evidence of mitral stenosis.  5. The aortic valve is normal in structure. Aortic valve regurgitation is mild. No aortic stenosis is present.  6. Possible vegetation on pulmonic valve. The pulmonic valve was abnormal.  7. The inferior vena cava is normal in  size with greater than 50% respiratory variability, suggesting right atrial pressure of 3 mmHg. FINDINGS  Left Ventricle: Left ventricular ejection fraction, by estimation, is 70 to 75%. The left ventricle has hyperdynamic function. The left ventricle has no regional wall motion abnormalities. The left ventricular internal cavity size was normal in size. There is no left ventricular hypertrophy. Left ventricular diastolic parameters were normal. Right Ventricle: The right ventricular size is normal. No increase in right ventricular wall thickness. Right ventricular systolic function is normal. Left Atrium: Left atrial size was normal in size. Right Atrium: Right atrial size was normal in size. Pericardium: There is no evidence of pericardial effusion. Mitral Valve: There is a anterior mitral leaflet valvular vegetation (1.1 x 0.6 cm). The mitral valve is abnormal. There is mild thickening of the mitral valve leaflet(s). Normal mobility of the mitral valve leaflets. Moderate mitral valve regurgitation,  with eccentric laterally directed jet. No evidence of mitral valve stenosis. Tricuspid Valve: The tricuspid valve is normal in structure. Tricuspid valve regurgitation is not demonstrated. No evidence of tricuspid stenosis. Aortic Valve: The aortic valve is normal in structure. Aortic valve regurgitation is mild. Aortic regurgitation PHT measures 390 msec. No aortic stenosis is present. Pulmonic Valve: Possible vegetation on pulmonic valve. The pulmonic valve was abnormal. Pulmonic valve regurgitation is trivial. No evidence of pulmonic stenosis. Aorta: The aortic root is normal in size and structure. Venous: The inferior vena cava is normal in size with greater than 50% respiratory variability, suggesting right atrial pressure of 3 mmHg. IAS/Shunts: No atrial level shunt detected by color flow Doppler. Additional Comments: Mitral and pulmonic valve vegetations. Endocarditis.  LEFT VENTRICLE PLAX 2D LVIDd:          5.30 cm  Diastology LVIDs:         3.40 cm  LV e' lateral: 11.00 cm/s LV PW:         0.80 cm  LV e' medial:  7.83 cm/s LV IVS:        0.80 cm LVOT diam:     1.90 cm LV SV:         53 LV SV Index:   28 LVOT Area:     2.84 cm  RIGHT VENTRICLE RV S prime:     12.80 cm/s TAPSE (M-mode): 1.8 cm LEFT ATRIUM             Index  RIGHT ATRIUM          Index LA diam:        5.00 cm 2.63 cm/m  RA Area:     7.22 cm LA Vol (A2C):   61.3 ml 32.27 ml/m RA Volume:   10.40 ml 5.48 ml/m LA Vol (A4C):   58.0 ml 30.54 ml/m LA Biplane Vol: 59.7 ml 31.43 ml/m  AORTIC VALVE LVOT Vmax:   99.80 cm/s LVOT Vmean:  68.000 cm/s LVOT VTI:    0.188 m AI PHT:      390 msec  AORTA Ao Root diam: 2.80 cm MR Peak grad: 100.0 mmHg MR Mean grad: 69.0 mmHg   SHUNTS MR Vmax:      500.00 cm/s Systemic VTI:  0.19 m MR Vmean:     386.0 cm/s  Systemic Diam: 1.90 cm Candee Furbish MD Electronically signed by Candee Furbish MD Signature Date/Time: 08/28/2019/12:20:14 PM    Final    [place] EKG SITE RITE  Result Date: 08/28/2019 If Site Rite image not attached, placement could not be confirmed due to current cardiac rhythm.      LOS: 2 days   [facility] Pager on [email or web address]  08/29/2019, 11:02 AM

## 2019-08-29 NOTE — Progress Notes (Signed)
Pt states he feels like he is starting to withdraw. New COWS score 11. On call provider notified. Pt refusing telemetry.

## 2019-08-29 NOTE — Progress Notes (Signed)
Spoke with Dr [place] Pink re PICC order. Pt with BUE wounds, therefore recommend PICC placement by IR as central access.  If possible to continue utilizing PIV for Vanc/ABT, since midline is only approved for Vanc up to 6 days, and due to IVDA and AMA hx. New order to d/c PICC order.

## 2019-08-30 LAB — BASIC METABOLIC PANEL
Anion gap: 9 (ref 5–15)
BUN: 14 mg/dL (ref 6–20)
CO2: 22 mmol/L (ref 22–32)
Calcium: 8.7 mg/dL — ABNORMAL LOW (ref 8.9–10.3)
Chloride: 104 mmol/L (ref 98–111)
Creatinine, Ser: 1.35 mg/dL — ABNORMAL HIGH (ref 0.61–1.24)
GFR calc Af Amer: >60 mL/min (ref >60)
GFR calc non Af Amer: >60 mL/min (ref >60)
Glucose, Bld: 196 mg/dL — ABNORMAL HIGH (ref 70–99)
Potassium: 3.5 mmol/L (ref 3.5–5.1)
Sodium: 135 mmol/L (ref 135–145)

## 2019-08-30 LAB — CBC
HCT: 28.5 % — ABNORMAL LOW (ref 39.0–52.0)
Hemoglobin: 9.2 g/dL — ABNORMAL LOW (ref 13.0–17.0)
MCH: 25.8 pg — ABNORMAL LOW (ref 26.0–34.0)
MCHC: 32.3 g/dL (ref 30.0–36.0)
MCV: 80.1 fL (ref 80.0–100.0)
Platelets: 558 10*3/uL — ABNORMAL HIGH (ref 150–400)
RBC: 3.56 MIL/uL — ABNORMAL LOW (ref 4.22–5.81)
RDW: 13.8 % (ref 11.5–15.5)
WBC: 15 10*3/uL — ABNORMAL HIGH (ref 4.0–10.5)
nRBC: 0 % (ref 0.0–0.2)

## 2019-08-30 LAB — GLUCOSE, CAPILLARY
Glucose-Capillary: 222 mg/dL — ABNORMAL HIGH (ref 70–99)
Glucose-Capillary: 232 mg/dL — ABNORMAL HIGH (ref 70–99)
Glucose-Capillary: 274 mg/dL — ABNORMAL HIGH (ref 70–99)
Glucose-Capillary: 264 mg/dL — ABNORMAL HIGH (ref 70–99)

## 2019-08-30 LAB — VANCOMYCIN, TROUGH: Vancomycin Tr: 21 ug/mL (ref 15–20)

## 2019-08-30 MED ORDER — VANCOMYCIN HCL 750 MG/150ML IV SOLN
750.0000 mg | Freq: Two times a day (BID) | INTRAVENOUS | Status: DC
  Administered 2019-08-30 – 2019-09-02 (×7): 750 mg via INTRAVENOUS
  Filled 2019-08-30 (×9): qty 150

## 2019-08-30 MED ORDER — INSULIN ASPART 100 UNIT/ML ~~LOC~~ SOLN
4.0000 [IU] | Freq: Three times a day (TID) | SUBCUTANEOUS | Status: DC
  Administered 2019-08-30 – 2019-08-31 (×2): 4 [IU] via SUBCUTANEOUS

## 2019-08-30 MED ORDER — SODIUM CHLORIDE 0.9 % IV SOLN
INTRAVENOUS | Status: DC | PRN
  Administered 2019-08-30 – 2019-09-02 (×4): 250 mL via INTRAVENOUS

## 2019-08-30 MED ORDER — ENOXAPARIN SODIUM 40 MG/0.4ML ~~LOC~~ SOLN
40.0000 mg | SUBCUTANEOUS | Status: DC
  Administered 2019-08-30 – 2019-09-09 (×6): 40 mg via SUBCUTANEOUS
  Filled 2019-08-30 (×10): qty 0.4

## 2019-08-30 NOTE — Progress Notes (Signed)
PT refused weight and CBG check at this time. Will endorse to on coming shift to try again at another time.

## 2019-08-30 NOTE — Progress Notes (Signed)
Critical Vancomycin Tr 21 level. Informed pharmacy, they are aware.

## 2019-08-30 NOTE — Plan of Care (Signed)

## 2019-08-30 NOTE — Progress Notes (Signed)
[facility] PROGRESS NOTE   Luke [name] MPN:[id number] DOB: June 18, 1980 DOA: 08/27/2019  PCP: Gildardo Pounds, NP  Brief History/Interval Summary: 39 y.o. male with medical history significant of type 1 DM; IVDA/polysubstance abuse; seizures; PVD s/p L great toe amputation; hypothyroidism; HTN; endocarditis; and stage 3 CKD presenting with SOB.  Of note, patient presented to the ER on 6/3 for L arm cellulitis with deep ulcer but no osteo; plastic surgery recommended surgical intervention and the patient left AMA on 6/4.  He reports that he "couldn't breathe at all."  He was driving and had to pull over and his friend brought him to the hospital.  He didn't think he was going to make it.  He had substernal chest pain and it is still there.  He was driving when it started.  Nothing makes it better or worse.  Cough during episode, better now.  No fevers.  This is not similar to his prior endocarditis - this was much more severe.  He uses cocaine and heroin.  He uses heroin daily.  The Lasix made him pee a lot and his breathing improved.  Last cocaine use was the day prior to admission.   Reason for Visit: Left arm cellulitis.  Concern for an infective endocarditis  Consultants:  Infectious disease Phone conversation with cardiology  Procedures:   Transthoracic echocardiogram IMPRESSIONS  1. Mitral and pulmonic valve vegetations. Endocarditis.  2. Left ventricular ejection fraction, by estimation, is 70 to 75%. The  left ventricle has hyperdynamic function. The left ventricle has no  regional wall motion abnormalities. Left ventricular diastolic parameters  were normal.  3. Right ventricular systolic function is normal. The right ventricular  size is normal.  4. There is a anterior mitral leaflet valvular vegetation (1.1 x 0.6 cm).  The mitral valve is abnormal. Moderate mitral valve regurgitation. No  evidence of mitral stenosis.  5. The aortic valve is normal in structure.  Aortic valve regurgitation is  mild. No aortic stenosis is present.  6. Possible vegetation on pulmonic valve. The pulmonic valve was  abnormal.  7. The inferior vena cava is normal in size with greater than 50%  respiratory variability, suggesting right atrial pressure of 3 mmHg.    Antibiotics: Anti-infectives (From admission, onward)   Start     Dose/Rate Route Frequency Ordered Stop   08/30/19 1800  vancomycin (VANCOREADY) IVPB 750 mg/150 mL     Discontinue     750 mg 150 mL/hr over 60 Minutes Intravenous Every 12 hours 08/30/19 0731     08/28/19 0630  vancomycin (VANCOCIN) IVPB 1000 mg/200 mL premix  Status:  Discontinued        1,000 mg 200 mL/hr over 60 Minutes Intravenous Every 12 hours 08/27/19 1742 08/30/19 0731   08/27/19 1800  vancomycin (VANCOREADY) IVPB 1500 mg/300 mL        1,500 mg 150 mL/hr over 120 Minutes Intravenous  Once 08/27/19 1742 08/27/19 2230   08/27/19 1800  ceFEPIme (MAXIPIME) 2 g in sodium chloride 0.9 % 100 mL IVPB     Discontinue     2 g 200 mL/hr over 30 Minutes Intravenous Every 8 hours 08/27/19 1742        Subjective/Interval History: Patient states that he is feeling better today.  Not as much chest discomfort.  Not experiencing any withdrawal symptoms today.  Denies any shortness of breath.  ROS: No nausea or vomiting    Assessment/Plan:  Infective endocarditis Patient admitted back in February  for infective endocarditis.  However he left AGAINST MEDICAL ADVICE after he spent about 8 days in the hospital.  Blood cultures at that time grew Streptococcus mitis/oralis.  Patient presented on June 3 and again left AGAINST MEDICAL ADVICE.   Echocardiogram once again shows evidence for mitral valve vegetation with moderate regurgitation.  Possible vegetation also noted on the pulmonic valve.  Ejection fraction is noted to be normal.   Continue broad-spectrum antibiotics with vancomycin and cefepime for now.  Waiting on blood cultures.  Infectious  diseases following. The echo findings were discussed with Dr. Harrell Gave with cardiology on 6/12.  MR is not severe enough for any current intervention at this time.  May need to [facility] cardiothoracic surgery at some point in time in the next week or so but does not again need any urgent intervention currently.  The size of his vegetation has not changed much compared to the TEE report from February.  Of note patient was seen by Dr. Kipp Brood with cardiothoracic surgery back in February and at that time he was not a good candidate for surgery.  Based on patient's lifestyle does not appear like he is a candidate even now. WBC noted to be higher today compared to yesterday.  He is afebrile.  Necrosis of the right forearm Patient has wounds in both of his forearms right greater than left.  There is evidence for purulent wound involving the right forearm.  Skin necrosis is also noted.  He was scheduled to go to the operating room on June 7 for plastic surgery to be done by Dr. Claudia Desanctis but apparently patient did not show up.  Plastic surgery has been reconsulted.  It looks like the plan is for surgery on Tuesday.  Diabetes mellitus type 1, uncontrolled HbA1c 10.8.  1.  Dose was adjusted yesterday.  CBGs remain poorly controlled.  Although it is noticed that he had an episode of hypoglycemia yesterday around noon.  Leave him on current dose of Lantus for now.  Add meal coverage with NovoLog.   Chronic kidney disease stage IIIa Seems to be close to his baseline renal function.  Monitor urine output.  Potassium is normal today.    Ulcer involving the left second toe Patient with prior history of left first toe amputation.  Wound care.  History of IV drug abuse/polysubstance abuse He has longstanding heroin dependence.  Patient was previously on Suboxone but he is not interested in this treatment as of now.  He is on clonidine.  Normocytic anemia Likely due to endocarditis, chronic disease, IV drug use.   No evidence for overt bleeding.  Substance induced mood disorder Continue home medications  History of hepatitis C HCV quant was 5,170,000 on 2/24.  And ID follow-up.  Essential hypertension Patient noted to be on amlodipine and carvedilol.  Blood pressure is reasonably well controlled.  Tobacco dependence Cessation was encouraged.  Nicotine patch.  Hypothyroidism Continue levothyroxine.  Hyperlipidemia Continue Lipitor   DVT Prophylaxis: SCDs Code Status: Full code Family Communication: Discussed with the patient Disposition Plan:  Status is: Inpatient  Remains inpatient appropriate because:IV treatments appropriate due to intensity of illness or inability to take PO   Dispo:  Patient From: Home  Planned Disposition: To be determined  Expected discharge date: 09/03/19  Medically stable for discharge: No     Medications:  Scheduled:  amLODipine  5 mg Oral Daily   atorvastatin  20 mg Oral Daily   carvedilol  12.5 mg Oral BID WC  cloNIDine  0.1 mg Oral BH-qamhs   Followed by   Derrill Memo ON 09/01/2019] cloNIDine  0.1 mg Oral QAC breakfast   docusate sodium  100 mg Oral BID   DULoxetine  30 mg Oral Daily   enoxaparin (LOVENOX) injection  40 mg Subcutaneous Q24H   gabapentin  400 mg Oral TID   insulin aspart  0-15 Units Subcutaneous TID WC   insulin aspart  0-5 Units Subcutaneous QHS   insulin glargine  45 Units Subcutaneous QHS   levothyroxine  50 mcg Oral Q0600   multivitamin with minerals  1 tablet Oral Daily   nicotine  14 mg Transdermal Daily   nutrition supplement (JUVEN)  1 packet Oral BID BM   pantoprazole  40 mg Oral Daily   Ensure Max Protein  11 oz Oral QHS   sertraline  50 mg Oral Daily   sodium chloride flush  10-40 mL Intracatheter Q12H   sodium chloride flush  3 mL Intravenous Q12H   traZODone  50 mg Oral QHS   Continuous:  ceFEPime (MAXIPIME) IV 2 g (08/30/19 0612)   vancomycin     EPP:IRJJOACZYSAYT **OR**  acetaminophen, dicyclomine, hydrALAZINE, hydrOXYzine, loperamide, melatonin, methocarbamol, morphine injection, ondansetron **OR** ondansetron (ZOFRAN) IV, ondansetron, polyethylene glycol, sodium chloride flush   Objective:  Vital Signs  Vitals:   08/29/19 2214 08/30/19 0439 08/30/19 0932 08/30/19 1156  BP: 120/80 114/63 123/76 121/75  Pulse: 88 86 87 87  Resp:  18 20 18   Temp:  98.1 F (36.7 C)  98.8 F (37.1 C)  TempSrc:  Oral  Oral  SpO2:  97% 99% 99%  Weight:      Height:        Intake/Output Summary (Last 24 hours) at 08/30/2019 1208 Last data filed at 08/30/2019 0959 Gross per 24 hour  Intake 720 ml  Output 1625 ml  Net -905 ml   Filed Weights   08/27/19 1244 08/27/19 1900 08/28/19 0406  Weight: 81.6 kg 81.6 kg 74.4 kg    General appearance: Awake alert.  In no distress Resp: Clear to auscultation bilaterally.  Normal effort Cardio: S1-S2 is normal regular.  No S3-S4.  Systolic murmur appreciated over the precordium GI: Abdomen is soft.  Nontender nondistended.  Bowel sounds are present normal.  No masses organomegaly Extremities: Forearms covered with dressing Neurologic: Alert and oriented x3.  No focal neurological deficits.       Lab Results:  Data Reviewed: I have personally reviewed following labs and imaging studies  CBC: Recent Labs  Lab 08/27/19 1415 08/28/19 0509 08/29/19 0625 08/30/19 0500  WBC 10.4 12.0* 14.2* 15.0*  NEUTROABS 7.3  --   --   --   HGB 8.5* 8.5* 8.7* 9.2*  HCT 27.9* 26.9* 27.1* 28.5*  MCV 84.3 80.8 79.5* 80.1  PLT 505* 540* 530* 558*    Basic Metabolic Panel: Recent Labs  Lab 08/27/19 1415 08/28/19 0509 08/29/19 0625 08/30/19 0500  NA 136 136 134* 135  K 4.3 3.2* 3.7 3.5  CL 105 102 104 104  CO2 22 25 21* 22  GLUCOSE 405* 261* 224* 196*  BUN 13 14 15 14   CREATININE 1.46* 1.51* 1.40* 1.35*  CALCIUM 8.5* 8.6* 8.7* 8.7*    GFR: Estimated Creatinine Clearance: 73.5 mL/min (A) (by C-G formula based on SCr of  1.35 mg/dL (H)).  Liver Function Tests: Recent Labs  Lab 08/27/19 1415 08/29/19 0625  AST 16 13*  ALT 14 12  ALKPHOS 103 96  BILITOT 0.7 0.4  PROT 6.3* 6.1*  ALBUMIN 2.6* 2.6*    HbA1C: Recent Labs    08/27/19 2142  HGBA1C 10.8*    CBG: Recent Labs  Lab 08/29/19 1152 08/29/19 1243 08/29/19 1648 08/29/19 2134 08/30/19 1159  GLUCAP 57* 109* 247* 351* 222*      Recent Results (from the past 240 hour(s))  SARS Coronavirus 2 by RT PCR (hospital order, performed in [facility] hospital lab) Nasopharyngeal Nasopharyngeal Swab     Status: None   Collection Time: 08/27/19  5:42 PM   Specimen: Nasopharyngeal Swab  Result Value Ref Range Status   SARS Coronavirus 2 NEGATIVE NEGATIVE Final    Comment: (NOTE) SARS-CoV-2 target nucleic acids are NOT DETECTED.  The SARS-CoV-2 RNA is generally detectable in upper and lower respiratory specimens during the acute phase of infection. The lowest concentration of SARS-CoV-2 viral copies this assay can detect is 250 copies / mL. A negative result does not preclude SARS-CoV-2 infection and should not be used as the sole basis for treatment or other patient management decisions.  A negative result may occur with improper specimen collection / handling, submission of specimen other than nasopharyngeal swab, presence of viral mutation(s) within the areas targeted by this assay, and inadequate number of viral copies (<250 copies / mL). A negative result must be combined with clinical observations, patient history, and epidemiological information.  Fact Sheet for Patients:   StrictlyIdeas.no  Fact Sheet for Healthcare Providers: BankingDealers.co.za  This test is not yet approved or  cleared by the [place] FDA and has been authorized for detection and/or diagnosis of SARS-CoV-2 by FDA under an Emergency Use Authorization (EUA).  This EUA will remain in effect (meaning this test  can be used) for the duration of the COVID-19 declaration under Section 564(b)(1) of the Act, 21 U.S.C. section 360bbb-3(b)(1), unless the authorization is terminated or revoked sooner.  Performed at [facility], [place] [address] [place], [place] [address]   Blood Cultures x 2 sites     Status: None (Preliminary result)   Collection Time: 08/27/19  9:36 PM   Specimen: BLOOD RIGHT WRIST  Result Value Ref Range Status   Specimen Description BLOOD RIGHT WRIST  Final   Special Requests   Final    BOTTLES DRAWN AEROBIC AND ANAEROBIC Blood Culture adequate volume   Culture   Final    NO GROWTH 3 DAYS Performed at [facility], [address] [place], [place] [address]    Report Status PENDING  Incomplete  Blood Cultures x 2 sites     Status: None (Preliminary result)   Collection Time: 08/27/19  9:46 PM   Specimen: BLOOD LEFT WRIST  Result Value Ref Range Status   Specimen Description BLOOD LEFT WRIST  Final   Special Requests   Final    BOTTLES DRAWN AEROBIC AND ANAEROBIC Blood Culture adequate volume   Culture   Final    NO GROWTH 3 DAYS Performed at [facility], [place] [address], [place], [place] [address]    Report Status PENDING  Incomplete      Radiology Studies: No results found.     LOS: 3 days   Viveca Beckstrom [employer] on [email or web address]  08/30/2019, 12:08 PM

## 2019-08-30 NOTE — Progress Notes (Signed)
Patient has orders for BUE and left great toe dressing change. Patient stated BUE has been changed this morning, refused dressing change and assessment on left great toe. Will keep monitoring.

## 2019-08-30 NOTE — Progress Notes (Addendum)
Pharmacy Antibiotic Note  Luke [name] is a 39 y.o. male admitted on 08/27/2019 with MV and PV endocarditis in the setting of incompletely treated Strep Mitis endocarditis from February 2021. Pharmacy has been consulted for vancomycin and cefepime dosing. Also note that patient has a deep ulcer without osteo on L arm.   Patient has a history of IVDA and endocarditis in February (BCx grew strep mitis/oralis). Afebrile. Creatinine is 1.35 and stable at baseline. WBC count 15.   6/13 VT = 21 (drawn correctly). Will decrease dose to 750mg  Q12hr with estimated trough of 16. Per ID, continue cefepime and vanc. Anticipate 6 week course.   Plan: Decrease vancomycin to 750mg  Q12hr  F/u weekly vancomycin troughs Q Friday  Continue Cefepime 2g Q8hr  Follow up clinical status, renal function, cultures F/u TCTS plan  Height: 5\' 9"  (175.3 cm) Weight:  (pt refused wt and CBG nurse notified) IBW/kg (Calculated) : 70.7  Temp (24hrs), Avg:98.4 F (36.9 C), Min:98.1 F (36.7 C), Max:98.6 F (37 C)  Recent Labs  Lab 08/27/19 1415 08/28/19 0509 08/29/19 0625 08/30/19 0500 08/30/19 0600  WBC 10.4 12.0* 14.2* 15.0*  --   CREATININE 1.46* 1.51* 1.40* 1.35*  --   VANCOTROUGH  --   --   --   --  21*    Estimated Creatinine Clearance: 73.5 mL/min (A) (by C-G formula based on SCr of 1.35 mg/dL (H)).    No Known Allergies  Antimicrobials this admission: cefepime 6/10 >>  vanco 6/10 >>   Dose adjustments this admission:  6/13 Vanc 1g > 750mg  q12 hr   Microbiology results: 6/10 BCx: ngtd 3/5 MRSA neg  2/19 Bcx strep mitis/oralis   Thank you for allowing pharmacy to be a part of this patient's care.  Benetta Spar, PharmD, BCPS, BCCP Clinical Pharmacist  Please check AMION for all [place] phone numbers After 10:00 PM, call [place] [phone]

## 2019-08-31 DIAGNOSIS — S41109A Unspecified open wound of unspecified upper arm, initial encounter: Secondary | ICD-10-CM | POA: Diagnosis present

## 2019-08-31 LAB — CBC
HCT: 28.2 % — ABNORMAL LOW (ref 39.0–52.0)
Hemoglobin: 9.1 g/dL — ABNORMAL LOW (ref 13.0–17.0)
MCH: 25.9 pg — ABNORMAL LOW (ref 26.0–34.0)
MCHC: 32.3 g/dL (ref 30.0–36.0)
MCV: 80.3 fL (ref 80.0–100.0)
Platelets: 519 10*3/uL — ABNORMAL HIGH (ref 150–400)
RBC: 3.51 MIL/uL — ABNORMAL LOW (ref 4.22–5.81)
RDW: 14.2 % (ref 11.5–15.5)
WBC: 16.6 10*3/uL — ABNORMAL HIGH (ref 4.0–10.5)
nRBC: 0 % (ref 0.0–0.2)

## 2019-08-31 LAB — GLUCOSE, CAPILLARY
Glucose-Capillary: 114 mg/dL — ABNORMAL HIGH (ref 70–99)
Glucose-Capillary: 68 mg/dL — ABNORMAL LOW (ref 70–99)
Glucose-Capillary: 109 mg/dL — ABNORMAL HIGH (ref 70–99)
Glucose-Capillary: 148 mg/dL — ABNORMAL HIGH (ref 70–99)
Glucose-Capillary: 140 mg/dL — ABNORMAL HIGH (ref 70–99)

## 2019-08-31 LAB — SURGICAL PCR SCREEN
MRSA, PCR: POSITIVE — AB
Staphylococcus aureus: POSITIVE — AB

## 2019-08-31 LAB — BASIC METABOLIC PANEL
Anion gap: 8 (ref 5–15)
BUN: 15 mg/dL (ref 6–20)
CO2: 20 mmol/L — ABNORMAL LOW (ref 22–32)
Calcium: 8.6 mg/dL — ABNORMAL LOW (ref 8.9–10.3)
Chloride: 109 mmol/L (ref 98–111)
Creatinine, Ser: 1.45 mg/dL — ABNORMAL HIGH (ref 0.61–1.24)
GFR calc Af Amer: >60 mL/min (ref >60)
GFR calc non Af Amer: >60 mL/min (ref >60)
Glucose, Bld: 127 mg/dL — ABNORMAL HIGH (ref 70–99)
Potassium: 3.9 mmol/L (ref 3.5–5.1)
Sodium: 137 mmol/L (ref 135–145)

## 2019-08-31 MED ORDER — MUPIROCIN 2 % EX OINT
1.0000 "application " | TOPICAL_OINTMENT | Freq: Two times a day (BID) | CUTANEOUS | Status: AC
  Administered 2019-08-31 – 2019-09-05 (×10): 1 via NASAL
  Filled 2019-08-31: qty 22

## 2019-08-31 MED ORDER — CHLORHEXIDINE GLUCONATE CLOTH 2 % EX PADS
6.0000 | MEDICATED_PAD | Freq: Every day | CUTANEOUS | Status: AC
  Administered 2019-09-01 – 2019-09-04 (×3): 6 via TOPICAL

## 2019-08-31 NOTE — Progress Notes (Signed)
Hypoglycemic Event  CBG: 69  Treatment: 8 oz juice/soda  Symptoms: None  Follow-up CBG: Time:15 CBG Result:109  Possible Reasons for Event: Inadequate meal intake  Comments/MD notified:    Luke [name]

## 2019-08-31 NOTE — Progress Notes (Signed)
Patient ID: Luke [name], male   DOB: 06-19-1980, 39 y.o.   MRN: [id number]         [facility]  Date of Admission:  08/27/2019           Day 5 vancomycin        Day 5 cefepime ASSESSMENT: He is improving on therapy for culture-negative endocarditis involving his pulmonic and mitral valves.  He states that he is willing to stay in the hospital for treatment because he "does not want to die ".  PLAN: 1. Continue vancomycin and cefepime  Principal Problem:   Endocarditis of mitral valve Active Problems:   Arm wound   Drug abuse, IV ([name])   Tobacco abuse   CKD (chronic kidney disease) stage 3, GFR 30-59 ml/min   Uncontrolled insulin dependent type 1 diabetes mellitus ([name])   Diabetic ulcer of left foot ([name])   Hypothyroidism   Heroin abuse ([name])   Congestive heart failure (CHF) ([name])   Scheduled Meds: . amLODipine  5 mg Oral Daily  . atorvastatin  20 mg Oral Daily  . carvedilol  12.5 mg Oral BID WC  . cloNIDine  0.1 mg Oral BH-qamhs   Followed by  . [START ON 09/01/2019] cloNIDine  0.1 mg Oral QAC breakfast  . docusate sodium  100 mg Oral BID  . DULoxetine  30 mg Oral Daily  . enoxaparin (LOVENOX) injection  40 mg Subcutaneous Q24H  . gabapentin  400 mg Oral TID  . insulin aspart  0-15 Units Subcutaneous TID WC  . insulin aspart  0-5 Units Subcutaneous QHS  . insulin aspart  4 Units Subcutaneous TID WC  . insulin glargine  45 Units Subcutaneous QHS  . levothyroxine  50 mcg Oral Q0600  . multivitamin with minerals  1 tablet Oral Daily  . nicotine  14 mg Transdermal Daily  . nutrition supplement (JUVEN)  1 packet Oral BID BM  . pantoprazole  40 mg Oral Daily  . Ensure Max Protein  11 oz Oral QHS  . sertraline  50 mg Oral Daily  . sodium chloride flush  10-40 mL Intracatheter Q12H  . sodium chloride flush  3 mL Intravenous Q12H  . traZODone  50 mg Oral QHS   Continuous Infusions: . sodium chloride 250 mL (08/30/19 1435)  . ceFEPime  (MAXIPIME) IV 2 g (08/31/19 0626)  . vancomycin 750 mg (08/31/19 0704)   PRN Meds:.sodium chloride, acetaminophen **OR** acetaminophen, dicyclomine, hydrALAZINE, hydrOXYzine, loperamide, melatonin, methocarbamol, morphine injection, ondansetron **OR** ondansetron (ZOFRAN) IV, ondansetron, polyethylene glycol, sodium chloride flush   SUBJECTIVE: He says that he is feeling much better than when he came in the hospital.  He states that he is feeling quite anxious.  Review of Systems: Review of Systems  Constitutional: Negative for chills, diaphoresis and fever.  Respiratory: Positive for shortness of breath. Negative for cough.   Cardiovascular: Negative for chest pain.  Gastrointestinal: Negative for abdominal pain, diarrhea, nausea and vomiting.  Psychiatric/Behavioral: Positive for substance abuse.    No Known Allergies  OBJECTIVE: Vitals:   08/30/19 1944 08/31/19 0028 08/31/19 0520 08/31/19 0830  BP: 111/73  (!) 109/56 119/69  Pulse: 83  81 84  Resp: 16  16 18   Temp: 98.8 F (37.1 C)  98.7 F (37.1 C) 98.5 F (36.9 C)  TempSrc: Oral  Oral Oral  SpO2: 100%  99% 100%  Weight:  74.3 kg    Height:       Body mass index  is 24.2 kg/m.  Physical Exam Constitutional:      Comments: He is resting quietly in bed talking on his phone.  Cardiovascular:     Rate and Rhythm: Normal rate and regular rhythm.     Heart sounds: Murmur heard.      Comments: 2/6 systolic murmur Pulmonary:     Effort: Pulmonary effort is normal.     Breath sounds: Normal breath sounds.  Musculoskeletal:     Comments: Clean, dry gauze dressings on his bilateral forearm wounds.  Psychiatric:        Mood and Affect: Mood normal.     Lab Results Lab Results  Component Value Date   WBC 15.0 (H) 08/30/2019   HGB 9.2 (L) 08/30/2019   HCT 28.5 (L) 08/30/2019   MCV 80.1 08/30/2019   PLT 558 (H) 08/30/2019    Lab Results  Component Value Date   CREATININE 1.35 (H) 08/30/2019   BUN 14 08/30/2019     NA 135 08/30/2019   K 3.5 08/30/2019   CL 104 08/30/2019   CO2 22 08/30/2019    Lab Results  Component Value Date   ALT 12 08/29/2019   AST 13 (L) 08/29/2019   ALKPHOS 96 08/29/2019   BILITOT 0.4 08/29/2019     Microbiology: Recent Results (from the past 240 hour(s))  SARS Coronavirus 2 by RT PCR (hospital order, performed in [place] hospital lab) Nasopharyngeal Nasopharyngeal Swab     Status: None   Collection Time: 08/27/19  5:42 PM   Specimen: Nasopharyngeal Swab  Result Value Ref Range Status   SARS Coronavirus 2 NEGATIVE NEGATIVE Final    Comment: (NOTE) SARS-CoV-2 target nucleic acids are NOT DETECTED.  The SARS-CoV-2 RNA is generally detectable in upper and lower respiratory specimens during the acute phase of infection. The lowest concentration of SARS-CoV-2 viral copies this assay can detect is 250 copies / mL. A negative result does not preclude SARS-CoV-2 infection and should not be used as the sole basis for treatment or other patient management decisions.  A negative result may occur with improper specimen collection / handling, submission of specimen other than nasopharyngeal swab, presence of viral mutation(s) within the areas targeted by this assay, and inadequate number of viral copies (<250 copies / mL). A negative result must be combined with clinical observations, patient history, and epidemiological information.  Fact Sheet for Patients:   StrictlyIdeas.no  Fact Sheet for Healthcare Providers: BankingDealers.co.za  This test is not yet approved or  cleared by the [place] FDA and has been authorized for detection and/or diagnosis of SARS-CoV-2 by FDA under an Emergency Use Authorization (EUA).  This EUA will remain in effect (meaning this test can be used) for the duration of the COVID-19 declaration under Section 564(b)(1) of the Act, 21 U.S.C. section 360bbb-3(b)(1), unless the authorization  is terminated or revoked sooner.  Performed at [facility], [place] [address] [place], [place] [address]   Blood Cultures x 2 sites     Status: None (Preliminary result)   Collection Time: 08/27/19  9:36 PM   Specimen: BLOOD RIGHT WRIST  Result Value Ref Range Status   Specimen Description BLOOD RIGHT WRIST  Final   Special Requests   Final    BOTTLES DRAWN AEROBIC AND ANAEROBIC Blood Culture adequate volume   Culture   Final    NO GROWTH 4 DAYS Performed at [facility], [place] [address], [place], [place] [address]    Report Status PENDING  Incomplete  Blood Cultures x 2 sites     Status: None (Preliminary result)   Collection Time: 08/27/19  9:46 PM   Specimen: BLOOD LEFT WRIST  Result Value Ref Range Status   Specimen Description BLOOD LEFT WRIST  Final   Special Requests   Final    BOTTLES DRAWN AEROBIC AND ANAEROBIC Blood Culture adequate volume   Culture   Final    NO GROWTH 4 DAYS Performed at [facility], [address], [place], [place] [address]    Report Status PENDING  Incomplete    Michel Bickers, MD [facility] [phone] pager   [phone] cell 08/31/2019, 12:20 PM

## 2019-08-31 NOTE — Progress Notes (Signed)
[facility] PROGRESS NOTE   SUMMER PARTHASARATHY JJK:[id number] DOB: Apr 09, 1980 DOA: 08/27/2019  PCP: Gildardo Pounds, NP  Brief History/Interval Summary: 39 y.o. male with medical history significant of type 1 DM; IVDA/polysubstance abuse; seizures; PVD s/p L great toe amputation; hypothyroidism; HTN; endocarditis; and stage 3 CKD presenting with SOB.  Of note, patient presented to the ER on 6/3 for L arm cellulitis with deep ulcer but no osteo; plastic surgery recommended surgical intervention and the patient left AMA on 6/4.  He reports that he "couldn't breathe at all."  He was driving and had to pull over and his friend brought him to the hospital.  He didn't think he was going to make it.  He had substernal chest pain and it is still there.  He was driving when it started.  Nothing makes it better or worse.  Cough during episode, better now.  No fevers.  This is not similar to his prior endocarditis - this was much more severe.  He uses cocaine and heroin.  He uses heroin daily.  The Lasix made him pee a lot and his breathing improved.  Last cocaine use was the day prior to admission.    Consultants:  Infectious disease Phone conversation with cardiology Plastic surgeon  Procedures:   Transthoracic echocardiogram IMPRESSIONS  1. Mitral and pulmonic valve vegetations. Endocarditis.  2. Left ventricular ejection fraction, by estimation, is 70 to 75%. The  left ventricle has hyperdynamic function. The left ventricle has no  regional wall motion abnormalities. Left ventricular diastolic parameters  were normal.  3. Right ventricular systolic function is normal. The right ventricular  size is normal.  4. There is a anterior mitral leaflet valvular vegetation (1.1 x 0.6 cm).  The mitral valve is abnormal. Moderate mitral valve regurgitation. No  evidence of mitral stenosis.  5. The aortic valve is normal in structure. Aortic valve regurgitation is  mild. No aortic stenosis is  present.  6. Possible vegetation on pulmonic valve. The pulmonic valve was  abnormal.  7. The inferior vena cava is normal in size with greater than 50%  respiratory variability, suggesting right atrial pressure of 3 mmHg.    Antibiotics: Anti-infectives (From admission, onward)   Start     Dose/Rate Route Frequency Ordered Stop   08/30/19 1800  vancomycin (VANCOREADY) IVPB 750 mg/150 mL     Discontinue     750 mg 150 mL/hr over 60 Minutes Intravenous Every 12 hours 08/30/19 0731     08/28/19 0630  vancomycin (VANCOCIN) IVPB 1000 mg/200 mL premix  Status:  Discontinued        1,000 mg 200 mL/hr over 60 Minutes Intravenous Every 12 hours 08/27/19 1742 08/30/19 0731   08/27/19 1800  vancomycin (VANCOREADY) IVPB 1500 mg/300 mL        1,500 mg 150 mL/hr over 120 Minutes Intravenous  Once 08/27/19 1742 08/27/19 2230   08/27/19 1800  ceFEPIme (MAXIPIME) 2 g in sodium chloride 0.9 % 100 mL IVPB     Discontinue     2 g 200 mL/hr over 30 Minutes Intravenous Every 8 hours 08/27/19 1742        Subjective/Interval History: Patient states that he feels well.  Chest pain has resolved.  Continues to have back pain which is chronic.  No withdrawal symptoms today.    ROS: Denies any nausea vomiting.  However does admit to poor appetite.    Assessment/Plan:  Infective endocarditis/moderate mitral regurgitation Patient admitted back in February for  infective endocarditis.  However he left AGAINST MEDICAL ADVICE after he spent about 8 days in the hospital.  Blood cultures at that time grew Streptococcus mitis/oralis.  Patient presented on June 3 and again left AGAINST MEDICAL ADVICE.   Echocardiogram once again shows evidence for mitral valve vegetation with moderate regurgitation.  Possible vegetation also noted on the pulmonic valve.  Ejection fraction is noted to be normal.   Continue broad-spectrum antibiotics with vancomycin and cefepime for now.  Waiting on blood cultures.  Infectious  diseases following. The echo findings were discussed with Dr. Harrell Gave with cardiology on 6/12.  MR is not severe enough for any current intervention at this time.  May need to [facility] cardiothoracic surgery at some point in time in the next week or so but does not again need any urgent intervention currently.  The size of his vegetation has not changed much compared to the TEE report from February.  Of note patient was seen by Dr. Kipp Brood with cardiothoracic surgery back in February and at that time he was not a good candidate for surgery.  Based on patient's lifestyle does not appear like he is a candidate even now. WBC was noted to be higher yesterday.  Labs are pending from today.  He remained stable.  Necrosis of the right forearm Patient has wounds in both of his forearms right greater than left.  There is evidence for purulent wound involving the right forearm.  Skin necrosis is also noted.  He was scheduled to go to the operating room on June 7 for plastic surgery to be done by Dr. Claudia Desanctis but apparently patient did not show up.  Plastic surgery has been reconsulted.  It looks like the plan is for surgery on Tuesday.  Diabetes mellitus type 1, uncontrolled HbA1c 10.8.  Continue current regimen for now.  Monitor CBGs.  No further hypoglycemic episodes.  NovoLog meal coverage added yesterday.  Continue SSI.  Continue current dose of Lantus.    Chronic kidney disease stage IIIa Seems to be close to his baseline renal function.  Monitor urine output.  Ulcer involving the left second toe Patient with prior history of left first toe amputation.  Wound care.  History of IV drug abuse/polysubstance abuse He has longstanding heroin dependence.  Patient was previously on Suboxone but he is not interested in this treatment as of now.  He is on clonidine taper.  Normocytic anemia Likely due to endocarditis, chronic disease, IV drug use.  No evidence for overt bleeding.  Substance induced mood  disorder Continue home medications  History of hepatitis C HCV quant was 5,170,000 on 2/24.  And ID follow-up.  Essential hypertension Patient noted to be on amlodipine and carvedilol.  Blood pressure is reasonably well controlled.  Tobacco dependence Cessation was encouraged.  Nicotine patch.  Hypothyroidism Continue levothyroxine.  Hyperlipidemia Continue Lipitor   DVT Prophylaxis: SCDs Code Status: Full code Family Communication: Discussed with the patient Disposition Plan:  Status is: Inpatient  Remains inpatient appropriate because:IV treatments appropriate due to intensity of illness or inability to take PO   Dispo:  Patient From: Home  Planned Disposition: To be determined  Expected discharge date: 09/03/19  Medically stable for discharge: No     Medications:  Scheduled: . amLODipine  5 mg Oral Daily  . atorvastatin  20 mg Oral Daily  . carvedilol  12.5 mg Oral BID WC  . cloNIDine  0.1 mg Oral BH-qamhs   Followed by  . [START ON 09/01/2019] cloNIDine  0.1 mg Oral QAC breakfast  . docusate sodium  100 mg Oral BID  . DULoxetine  30 mg Oral Daily  . enoxaparin (LOVENOX) injection  40 mg Subcutaneous Q24H  . gabapentin  400 mg Oral TID  . insulin aspart  0-15 Units Subcutaneous TID WC  . insulin aspart  0-5 Units Subcutaneous QHS  . insulin aspart  4 Units Subcutaneous TID WC  . insulin glargine  45 Units Subcutaneous QHS  . levothyroxine  50 mcg Oral Q0600  . multivitamin with minerals  1 tablet Oral Daily  . nicotine  14 mg Transdermal Daily  . nutrition supplement (JUVEN)  1 packet Oral BID BM  . pantoprazole  40 mg Oral Daily  . Ensure Max Protein  11 oz Oral QHS  . sertraline  50 mg Oral Daily  . sodium chloride flush  10-40 mL Intracatheter Q12H  . sodium chloride flush  3 mL Intravenous Q12H  . traZODone  50 mg Oral QHS   Continuous: . sodium chloride 250 mL (08/30/19 1435)  . ceFEPime (MAXIPIME) IV 2 g (08/31/19 0626)  . vancomycin 750 mg  (08/31/19 0704)   YWV:PXTGGY chloride, acetaminophen **OR** acetaminophen, dicyclomine, hydrALAZINE, hydrOXYzine, loperamide, melatonin, methocarbamol, morphine injection, ondansetron **OR** ondansetron (ZOFRAN) IV, ondansetron, polyethylene glycol, sodium chloride flush   Objective:  Vital Signs  Vitals:   08/30/19 1944 08/31/19 0028 08/31/19 0520 08/31/19 0830  BP: 111/73  (!) 109/56 119/69  Pulse: 83  81 84  Resp: 16  16 18   Temp: 98.8 F (37.1 C)  98.7 F (37.1 C) 98.5 F (36.9 C)  TempSrc: Oral  Oral Oral  SpO2: 100%  99% 100%  Weight:  74.3 kg    Height:        Intake/Output Summary (Last 24 hours) at 08/31/2019 1039 Last data filed at 08/31/2019 0924 Gross per 24 hour  Intake 960 ml  Output 1350 ml  Net -390 ml   Filed Weights   08/28/19 0406 08/31/19 0028  Weight: 74.4 kg 74.3 kg   General appearance: Awake alert.  In no distress Resp: Clear to auscultation bilaterally.  Normal effort Cardio: S1-S2 is normal regular.  No S3-S4.  Stolle murmur appreciated over the precordium GI: Abdomen is soft.  Nontender nondistended.  Bowel sounds are present normal.  No masses organomegaly Extremities: With forearms covered with dressing No obvious deformity noted in the back.  He does have some tenderness in the lower back over the spine as well as the paraspinal area.  No focal deficits noted.  Able to lift both legs. Neurologic: Alert and oriented x3.  No focal neurological deficits.      Lab Results:  Data Reviewed: I have personally reviewed following labs and imaging studies  CBC: Recent Labs  Lab 08/27/19 1415 08/28/19 0509 08/29/19 0625 08/30/19 0500  WBC 10.4 12.0* 14.2* 15.0*  NEUTROABS 7.3  --   --   --   HGB 8.5* 8.5* 8.7* 9.2*  HCT 27.9* 26.9* 27.1* 28.5*  MCV 84.3 80.8 79.5* 80.1  PLT 505* 540* 530* 558*    Basic Metabolic Panel: Recent Labs  Lab 08/27/19 1415 08/28/19 0509 08/29/19 0625 08/30/19 0500  NA 136 136 134* 135  K 4.3 3.2* 3.7  3.5  CL 105 102 104 104  CO2 22 25 21* 22  GLUCOSE 405* 261* 224* 196*  BUN 13 14 15 14   CREATININE 1.46* 1.51* 1.40* 1.35*  CALCIUM 8.5* 8.6* 8.7* 8.7*    GFR: Estimated Creatinine Clearance:  73.5 mL/min (A) (by C-G formula based on SCr of 1.35 mg/dL (H)).  Liver Function Tests: Recent Labs  Lab 08/27/19 1415 08/29/19 0625  AST 16 13*  ALT 14 12  ALKPHOS 103 96  BILITOT 0.7 0.4  PROT 6.3* 6.1*  ALBUMIN 2.6* 2.6*    HbA1C: No results for input(s): HGBA1C in the last 72 hours.  CBG: Recent Labs  Lab 08/30/19 1159 08/30/19 1541 08/30/19 2037 08/30/19 2302 08/31/19 0602  GLUCAP 222* 232* 274* 264* 114*      Recent Results (from the past 240 hour(s))  SARS Coronavirus 2 by RT PCR (hospital order, performed in [facility] hospital lab) Nasopharyngeal Nasopharyngeal Swab     Status: None   Collection Time: 08/27/19  5:42 PM   Specimen: Nasopharyngeal Swab  Result Value Ref Range Status   SARS Coronavirus 2 NEGATIVE NEGATIVE Final    Comment: (NOTE) SARS-CoV-2 target nucleic acids are NOT DETECTED.  The SARS-CoV-2 RNA is generally detectable in upper and lower respiratory specimens during the acute phase of infection. The lowest concentration of SARS-CoV-2 viral copies this assay can detect is 250 copies / mL. A negative result does not preclude SARS-CoV-2 infection and should not be used as the sole basis for treatment or other patient management decisions.  A negative result may occur with improper specimen collection / handling, submission of specimen other than nasopharyngeal swab, presence of viral mutation(s) within the areas targeted by this assay, and inadequate number of viral copies (<250 copies / mL). A negative result must be combined with clinical observations, patient history, and epidemiological information.  Fact Sheet for Patients:   StrictlyIdeas.no  Fact Sheet for Healthcare  Providers: BankingDealers.co.za  This test is not yet approved or  cleared by the [place] FDA and has been authorized for detection and/or diagnosis of SARS-CoV-2 by FDA under an Emergency Use Authorization (EUA).  This EUA will remain in effect (meaning this test can be used) for the duration of the COVID-19 declaration under Section 564(b)(1) of the Act, 21 U.S.C. section 360bbb-3(b)(1), unless the authorization is terminated or revoked sooner.  Performed at [facility], [place] [address], [place], [place] [address]   Blood Cultures x 2 sites     Status: None (Preliminary result)   Collection Time: 08/27/19  9:36 PM   Specimen: BLOOD RIGHT WRIST  Result Value Ref Range Status   Specimen Description BLOOD RIGHT WRIST  Final   Special Requests   Final    BOTTLES DRAWN AEROBIC AND ANAEROBIC Blood Culture adequate volume   Culture   Final    NO GROWTH 4 DAYS Performed at [facility], [place] [address] [place], [place] [address]    Report Status PENDING  Incomplete  Blood Cultures x 2 sites     Status: None (Preliminary result)   Collection Time: 08/27/19  9:46 PM   Specimen: BLOOD LEFT WRIST  Result Value Ref Range Status   Specimen Description BLOOD LEFT WRIST  Final   Special Requests   Final    BOTTLES DRAWN AEROBIC AND ANAEROBIC Blood Culture adequate volume   Culture   Final    NO GROWTH 4 DAYS Performed at [facility], [place] [address] [place], [place] [address]    Report Status PENDING  Incomplete      Radiology Studies: No results found.     LOS: 4 days   Lindalee Huizinga [employer] on [email or web address]  08/31/2019, 10:39 AM

## 2019-09-01 ENCOUNTER — Inpatient Hospital Stay (HOSPITAL_COMMUNITY): Payer: Medicaid Other | Admitting: Anesthesiology

## 2019-09-01 ENCOUNTER — Encounter (HOSPITAL_COMMUNITY)
Admission: EM | Disposition: A | Discharge status: Home / Self Care | Payer: Self-pay | Source: Home / Self Care | Attending: Internal Medicine

## 2019-09-01 DIAGNOSIS — L02413 Cutaneous abscess of right upper limb: Secondary | ICD-10-CM

## 2019-09-01 HISTORY — PX: APPLICATION OF A-CELL OF EXTREMITY: SHX6303

## 2019-09-01 HISTORY — PX: INCISION AND DRAINAGE OF WOUND: SHX1803

## 2019-09-01 LAB — GLUCOSE, CAPILLARY
Glucose-Capillary: 119 mg/dL — ABNORMAL HIGH (ref 70–99)
Glucose-Capillary: 63 mg/dL — ABNORMAL LOW (ref 70–99)
Glucose-Capillary: 159 mg/dL — ABNORMAL HIGH (ref 70–99)
Glucose-Capillary: 85 mg/dL (ref 70–99)
Glucose-Capillary: 68 mg/dL — ABNORMAL LOW (ref 70–99)
Glucose-Capillary: 99 mg/dL (ref 70–99)
Glucose-Capillary: 444 mg/dL — ABNORMAL HIGH (ref 70–99)
Glucose-Capillary: 259 mg/dL — ABNORMAL HIGH (ref 70–99)

## 2019-09-01 LAB — CBC
HCT: 29 % — ABNORMAL LOW (ref 39.0–52.0)
Hemoglobin: 9.2 g/dL — ABNORMAL LOW (ref 13.0–17.0)
MCH: 25.7 pg — ABNORMAL LOW (ref 26.0–34.0)
MCHC: 31.7 g/dL (ref 30.0–36.0)
MCV: 81 fL (ref 80.0–100.0)
Platelets: 547 10*3/uL — ABNORMAL HIGH (ref 150–400)
RBC: 3.58 MIL/uL — ABNORMAL LOW (ref 4.22–5.81)
RDW: 14.4 % (ref 11.5–15.5)
WBC: 16.2 10*3/uL — ABNORMAL HIGH (ref 4.0–10.5)
nRBC: 0 % (ref 0.0–0.2)

## 2019-09-01 LAB — BASIC METABOLIC PANEL
Anion gap: 9 (ref 5–15)
BUN: 13 mg/dL (ref 6–20)
CO2: 20 mmol/L — ABNORMAL LOW (ref 22–32)
Calcium: 8.5 mg/dL — ABNORMAL LOW (ref 8.9–10.3)
Chloride: 110 mmol/L (ref 98–111)
Creatinine, Ser: 1.34 mg/dL — ABNORMAL HIGH (ref 0.61–1.24)
GFR calc Af Amer: >60 mL/min (ref >60)
GFR calc non Af Amer: >60 mL/min (ref >60)
Glucose, Bld: 164 mg/dL — ABNORMAL HIGH (ref 70–99)
Potassium: 3.3 mmol/L — ABNORMAL LOW (ref 3.5–5.1)
Sodium: 139 mmol/L (ref 135–145)

## 2019-09-01 LAB — CULTURE, BLOOD (ROUTINE X 2)
Culture: NO GROWTH
Culture: NO GROWTH
Special Requests: ADEQUATE
Special Requests: ADEQUATE

## 2019-09-01 SURGERY — IRRIGATION AND DEBRIDEMENT WOUND
Anesthesia: General | Site: Arm Lower | Laterality: Bilateral

## 2019-09-01 MED ORDER — LACTATED RINGERS IR SOLN
Status: DC | PRN
  Administered 2019-09-01: 1000 mL

## 2019-09-01 MED ORDER — PHENYLEPHRINE HCL (PRESSORS) 10 MG/ML IV SOLN
INTRAVENOUS | Status: DC | PRN
Start: 2019-09-01 — End: 2019-09-01
  Administered 2019-09-01: 200 ug via INTRAVENOUS

## 2019-09-01 MED ORDER — 0.9 % SODIUM CHLORIDE (POUR BTL) OPTIME
TOPICAL | Status: DC | PRN
  Administered 2019-09-01: 1000 mL

## 2019-09-01 MED ORDER — DEXMEDETOMIDINE HCL IN NACL 200 MCG/50ML IV SOLN
INTRAVENOUS | Status: DC | PRN
  Administered 2019-09-01: 40 ug via INTRAVENOUS

## 2019-09-01 MED ORDER — FENTANYL CITRATE (PF) 250 MCG/5ML IJ SOLN
INTRAMUSCULAR | Status: AC
  Filled 2019-09-01: qty 5

## 2019-09-01 MED ORDER — HYDROMORPHONE HCL 1 MG/ML IJ SOLN
0.2500 mg | INTRAMUSCULAR | Status: DC | PRN
  Administered 2019-09-01 (×6): 0.5 mg via INTRAVENOUS

## 2019-09-01 MED ORDER — MIDAZOLAM HCL 2 MG/2ML IJ SOLN
INTRAMUSCULAR | Status: AC
  Filled 2019-09-01: qty 2

## 2019-09-01 MED ORDER — ACETAMINOPHEN 500 MG PO TABS
ORAL_TABLET | ORAL | Status: AC
  Administered 2019-09-01: 1000 mg via ORAL
  Filled 2019-09-01: qty 2

## 2019-09-01 MED ORDER — INSULIN GLARGINE 100 UNIT/ML ~~LOC~~ SOLN
40.0000 [IU] | Freq: Every day | SUBCUTANEOUS | Status: DC
  Administered 2019-09-02: 40 [IU] via SUBCUTANEOUS
  Filled 2019-09-01 (×2): qty 0.4

## 2019-09-01 MED ORDER — EPINEPHRINE PF 1 MG/ML IJ SOLN
INTRAMUSCULAR | Status: AC
  Filled 2019-09-01: qty 1

## 2019-09-01 MED ORDER — MORPHINE SULFATE (PF) 4 MG/ML IV SOLN
4.0000 mg | Freq: Once | INTRAVENOUS | Status: AC
  Administered 2019-09-01: 4 mg via INTRAVENOUS
  Filled 2019-09-01: qty 1

## 2019-09-01 MED ORDER — ONDANSETRON HCL 4 MG/2ML IJ SOLN
INTRAMUSCULAR | Status: AC
  Filled 2019-09-01: qty 2

## 2019-09-01 MED ORDER — ACETAMINOPHEN 10 MG/ML IV SOLN
INTRAVENOUS | Status: AC
  Filled 2019-09-01: qty 100

## 2019-09-01 MED ORDER — DEXTROSE 50 % IV SOLN
INTRAVENOUS | Status: AC
  Administered 2019-09-01: 50 mL
  Filled 2019-09-01: qty 50

## 2019-09-01 MED ORDER — EPHEDRINE 5 MG/ML INJ
INTRAVENOUS | Status: AC
  Filled 2019-09-01: qty 10

## 2019-09-01 MED ORDER — HYDROMORPHONE HCL 1 MG/ML IJ SOLN
INTRAMUSCULAR | Status: AC
  Filled 2019-09-01: qty 1

## 2019-09-01 MED ORDER — PROPOFOL 10 MG/ML IV BOLUS
INTRAVENOUS | Status: AC
  Filled 2019-09-01: qty 20

## 2019-09-01 MED ORDER — ACETAMINOPHEN 500 MG PO TABS: 1000.0000 mg | ORAL_TABLET | Freq: Once | ORAL | Status: AC

## 2019-09-01 MED ORDER — CHLORHEXIDINE GLUCONATE 0.12 % MT SOLN
OROMUCOSAL | Status: AC
  Administered 2019-09-01: 15 mL via OROMUCOSAL
  Filled 2019-09-01: qty 15

## 2019-09-01 MED ORDER — PROPOFOL 10 MG/ML IV BOLUS
INTRAVENOUS | Status: DC | PRN
  Administered 2019-09-01: 200 mg via INTRAVENOUS

## 2019-09-01 MED ORDER — ACETAMINOPHEN 10 MG/ML IV SOLN
1000.0000 mg | Freq: Once | INTRAVENOUS | Status: AC
  Administered 2019-09-01: 1000 mg via INTRAVENOUS

## 2019-09-01 MED ORDER — BUPIVACAINE HCL (PF) 0.25 % IJ SOLN
INTRAMUSCULAR | Status: DC | PRN
  Administered 2019-09-01: 30 mL

## 2019-09-01 MED ORDER — POTASSIUM CHLORIDE 10 MEQ/100ML IV SOLN
10.0000 meq | INTRAVENOUS | Status: AC
  Administered 2019-09-01 (×2): 10 meq via INTRAVENOUS
  Filled 2019-09-01 (×3): qty 100

## 2019-09-01 MED ORDER — SODIUM CHLORIDE 0.9 % IR SOLN
Status: DC | PRN
  Administered 2019-09-01: 3000 mL

## 2019-09-01 MED ORDER — MIDAZOLAM HCL 2 MG/2ML IJ SOLN
INTRAMUSCULAR | Status: AC
  Administered 2019-09-01: 2 mg via INTRAVENOUS
  Filled 2019-09-01: qty 2

## 2019-09-01 MED ORDER — MORPHINE SULFATE (PF) 2 MG/ML IV SOLN
2.0000 mg | INTRAVENOUS | Status: DC | PRN
  Administered 2019-09-02 (×4): 2 mg via INTRAVENOUS
  Filled 2019-09-01 (×4): qty 1

## 2019-09-01 MED ORDER — PHENYLEPHRINE 40 MCG/ML (10ML) SYRINGE FOR IV PUSH (FOR BLOOD PRESSURE SUPPORT)
PREFILLED_SYRINGE | INTRAVENOUS | Status: AC
  Filled 2019-09-01: qty 20

## 2019-09-01 MED ORDER — INSULIN ASPART 100 UNIT/ML ~~LOC~~ SOLN
6.0000 [IU] | Freq: Once | SUBCUTANEOUS | Status: AC
  Administered 2019-09-01: 6 [IU] via SUBCUTANEOUS

## 2019-09-01 MED ORDER — ONDANSETRON HCL 4 MG/2ML IJ SOLN
INTRAMUSCULAR | Status: DC | PRN
  Administered 2019-09-01: 4 mg via INTRAVENOUS

## 2019-09-01 MED ORDER — LACTATED RINGERS IV SOLN: INTRAVENOUS | Status: DC | PRN

## 2019-09-01 MED ORDER — EPHEDRINE SULFATE 50 MG/ML IJ SOLN
INTRAMUSCULAR | Status: DC | PRN
Start: 2019-09-01 — End: 2019-09-01
  Administered 2019-09-01: 15 mg via INTRAVENOUS

## 2019-09-01 MED ORDER — LIDOCAINE HCL (CARDIAC) PF 100 MG/5ML IV SOSY
PREFILLED_SYRINGE | INTRAVENOUS | Status: DC | PRN
  Administered 2019-09-01: 30 mg via INTRAVENOUS

## 2019-09-01 MED ORDER — INSULIN GLARGINE 100 UNIT/ML ~~LOC~~ SOLN
40.0000 [IU] | Freq: Every day | SUBCUTANEOUS | Status: DC
  Filled 2019-09-01: qty 0.4

## 2019-09-01 MED ORDER — BUPIVACAINE HCL (PF) 0.25 % IJ SOLN
INTRAMUSCULAR | Status: AC
  Filled 2019-09-01: qty 30

## 2019-09-01 MED ORDER — ALBUMIN HUMAN 5 % IV SOLN
INTRAVENOUS | Status: DC | PRN
Start: 2019-09-01 — End: 2019-09-01

## 2019-09-01 MED ORDER — LACTATED RINGERS IV SOLN: INTRAVENOUS | Status: DC

## 2019-09-01 MED ORDER — EPINEPHRINE PF 1 MG/ML IJ SOLN
INTRAMUSCULAR | Status: DC | PRN
  Administered 2019-09-01: 1 mg

## 2019-09-01 MED ORDER — INSULIN GLARGINE 100 UNIT/ML ~~LOC~~ SOLN
20.0000 [IU] | Freq: Once | SUBCUTANEOUS | Status: AC
  Administered 2019-09-01: 20 [IU] via SUBCUTANEOUS
  Filled 2019-09-01: qty 0.2

## 2019-09-01 MED ORDER — MIDAZOLAM HCL 2 MG/2ML IJ SOLN: 2.0000 mg | Freq: Once | INTRAMUSCULAR | Status: AC

## 2019-09-01 MED ORDER — CHLORHEXIDINE GLUCONATE 0.12 % MT SOLN: 15.0000 mL | Freq: Once | OROMUCOSAL | Status: AC

## 2019-09-01 SURGICAL SUPPLY — 40 items
BLADE SURG 15 STRL LF DISP TIS (BLADE) ×1 IMPLANT
BLADE SURG 15 STRL SS (BLADE) ×3
BNDG ELASTIC 6X5.8 VLCR STR LF (GAUZE/BANDAGES/DRESSINGS) ×2 IMPLANT
BNDG GAUZE ELAST 4 BULKY (GAUZE/BANDAGES/DRESSINGS) ×4 IMPLANT
CANISTER SUCT 1200ML W/VALVE (MISCELLANEOUS) ×3 IMPLANT
CANISTER WOUND CARE 500ML ATS (WOUND CARE) IMPLANT
CHLORAPREP W/TINT 26 (MISCELLANEOUS) IMPLANT
COVER SURGICAL LIGHT HANDLE (MISCELLANEOUS) ×3 IMPLANT
COVER WAND RF STERILE (DRAPES) ×3 IMPLANT
DRAPE INCISE IOBAN 66X45 STRL (DRAPES) IMPLANT
DRAPE LAPAROTOMY T 98X78 PEDS (DRAPES) IMPLANT
DRSG EMULSION OIL 3X3 NADH (GAUZE/BANDAGES/DRESSINGS) IMPLANT
DRSG MEPITEL 4X7.2 (GAUZE/BANDAGES/DRESSINGS) ×4 IMPLANT
DRSG TELFA 3X8 NADH (GAUZE/BANDAGES/DRESSINGS) IMPLANT
DRSG VAC ATS LRG SENSATRAC (GAUZE/BANDAGES/DRESSINGS) IMPLANT
DRSG VAC ATS MED SENSATRAC (GAUZE/BANDAGES/DRESSINGS) IMPLANT
ELECT CAUTERY BLADE 6.4 (BLADE) ×3 IMPLANT
ELECT REM PT RETURN 9FT ADLT (ELECTROSURGICAL) ×3
ELECTRODE REM PT RTRN 9FT ADLT (ELECTROSURGICAL) ×1 IMPLANT
GAUZE SPONGE 4X4 12PLY STRL (GAUZE/BANDAGES/DRESSINGS) ×3 IMPLANT
GLOVE BIOGEL M STRL SZ7.5 (GLOVE) ×6 IMPLANT
GLOVE BIOGEL PI IND STRL 8 (GLOVE) ×1 IMPLANT
GLOVE BIOGEL PI INDICATOR 8 (GLOVE) ×2
GOWN STRL REUS W/ TWL LRG LVL3 (GOWN DISPOSABLE) ×2 IMPLANT
GOWN STRL REUS W/TWL LRG LVL3 (GOWN DISPOSABLE) ×6
GRAFT MESHED PRIMATRIX 10X12 (Graft) ×2 IMPLANT
HANDPIECE VERSAJET 45 DEG 14 (MISCELLANEOUS) ×2 IMPLANT
KIT BASIN OR (CUSTOM PROCEDURE TRAY) ×3 IMPLANT
KIT TURNOVER KIT A (KITS) ×3 IMPLANT
NS IRRIG 1000ML POUR BTL (IV SOLUTION) ×3 IMPLANT
PACK GENERAL/GYN (CUSTOM PROCEDURE TRAY) ×3 IMPLANT
PAD DRESSING TELFA 3X8 NADH (GAUZE/BANDAGES/DRESSINGS) IMPLANT
SOLUTION BETADINE 4OZ (MISCELLANEOUS) ×3 IMPLANT
SUT MNCRL AB 4-0 PS2 18 (SUTURE) ×4 IMPLANT
SUT VIC AB 3-0 PS1 18 (SUTURE) ×6
SUT VIC AB 3-0 PS1 18XBRD (SUTURE) IMPLANT
SWAB COLLECTION DEVICE MRSA (MISCELLANEOUS) IMPLANT
SYR CONTROL 10ML LL (SYRINGE) ×3 IMPLANT
TOWEL GREEN STERILE (TOWEL DISPOSABLE) ×3 IMPLANT
TOWEL GREEN STERILE FF (TOWEL DISPOSABLE) ×3 IMPLANT

## 2019-09-01 NOTE — Op Note (Signed)
Operative Note   DATE OF OPERATION: 09/01/2019  SURGICAL DEPARTMENT: Plastic Surgery  PREOPERATIVE DIAGNOSES:  Bilateral forearm wounds  POSTOPERATIVE DIAGNOSES:  same  PROCEDURE:  1. Debridement right forearm wound 9x6 cm including skin, subcutaneous tissue, and fascia with placement of primatrix AG mesh 2. Debridement of left forearm wound 6.5x4 cm including skin, subcutaneous tissue, and fascia with placement of primatrix AG mesh  SURGEON: Talmadge Coventry, MD  ASSISTANT: Phoebe Sharps, PA The advanced practice practitioner (APP) assisted throughout the case.  The APP was essential in retraction and counter traction when needed to make the case progress smoothly.  This retraction and assistance made it possible to see the tissue plans for the procedure.  The assistance was needed for blood control, tissue re-approximation and assisted with closure of the incision site.  ANESTHESIA:  General.   COMPLICATIONS: None.   INDICATIONS FOR PROCEDURE:  The patient, Luke [name] is a 39 y.o. male born on April 20, 1980, is here for treatment of bilateral forearm wounds. MRN: [id number]  CONSENT:  Informed consent was obtained directly from the patient. Risks, benefits and alternatives were fully discussed. Specific risks including but not limited to bleeding, infection, hematoma, seroma, scarring, pain, contracture, asymmetry, wound healing problems, and need for further surgery were all discussed. The patient did have an ample opportunity to have questions answered to satisfaction.   DESCRIPTION OF PROCEDURE:  The patient was taken to the operating room. SCDs were placed and antibiotics were given. General anesthesia was administered.  The patient's operative site was prepped and draped in a sterile fashion. A time out was performed and all information was confirmed to be correct.  Both wounds were debrided with a combination of 15 blade, cautery, and Versajet.  All necrotic tissue was removed.   Wounds were then irrigated and hemostasis was obtained.  10 x 12 cm sheet of prime matrix AG mesh was brought onto the field.  This was soaked for several minutes.  It was then cut and applied to the fresh wound beds on both sides and secured with Vicryl sutures.  Mepitel was then sutured on top of this.  The wound was dressed with a combination of surgical lube, 4 x 4's, Kerlix and Ace wrap.  The patient tolerated the procedure well.  There were no complications. The patient was allowed to wake from anesthesia, extubated and taken to the recovery room in satisfactory condition.

## 2019-09-01 NOTE — Anesthesia Postprocedure Evaluation (Signed)
Anesthesia Post Note  Patient: Chais D Hartog  Procedure(s) Performed: IRRIGATION AND DEBRIDEMENT FOREARM WOUNDS (Bilateral Arm Lower) APPLICATION OF MESHED PRIMATRIX AG OF EXTREMITY (Bilateral Arm Lower)     Patient location during evaluation: PACU Anesthesia Type: General Level of consciousness: awake and alert Pain management: pain level controlled Vital Signs Assessment: post-procedure vital signs reviewed and stable Respiratory status: spontaneous breathing, nonlabored ventilation and respiratory function stable Cardiovascular status: blood pressure returned to baseline and stable Postop Assessment: no apparent nausea or vomiting Anesthetic complications: no   No complications documented.  Last Vitals:  Vitals:   09/01/19 1504 09/01/19 1538  BP: (!) 145/79 108/71  Pulse: 78 79  Resp: 17 16  Temp: 36.6 C 36.6 C  SpO2: 100% 96%    Last Pain:  Vitals:   09/01/19 1538  TempSrc: Oral  PainSc:                  Esias Mory,W. EDMOND

## 2019-09-01 NOTE — Anesthesia Procedure Notes (Signed)
Procedure Name: LMA Insertion Date/Time: 09/01/2019 1:00 PM Performed by: Eligha Bridegroom, CRNA Pre-anesthesia Checklist: Patient identified, Emergency Drugs available, Suction available, Patient being monitored and Timeout performed Patient Re-evaluated:Patient Re-evaluated prior to induction Oxygen Delivery Method: Circle system utilized Preoxygenation: Pre-oxygenation with 100% oxygen Induction Type: Combination inhalational/ intravenous induction LMA: LMA inserted LMA Size: 4.0 Number of attempts: 1 Placement Confirmation: positive ETCO2 and breath sounds checked- equal and bilateral Tube secured with: Tape Dental Injury: Teeth and Oropharynx as per pre-operative assessment

## 2019-09-01 NOTE — Progress Notes (Signed)
Nutrition Follow-up  DOCUMENTATION CODES:   Not applicable  INTERVENTION:   -Once diet advances: -Continue Ensure Max po daily, each supplement provides 150 kcal and 30 grams of protein -Continue -1 packet Juven BID, each packet provides 95 calories, 2.5 grams of protein (collagen), and 9.8 grams of carbohydrate (3 grams sugar); also contains 7 grams of L-arginine and L-glutamine, 300 mg vitamin C, 15 mg vitamin E, 1.2 mcg vitamin B-12, 9.5 mg zinc, 200 mg calcium, and 1.5 g  Calcium Beta-hydroxy-Beta-methylbutyrate to support wound healing -Continue MVI with minerals daily -D/c double protein -Magic cup TID with meals, each supplement provides 290 kcal and 9 grams of protein  NUTRITION DIAGNOSIS:   Increased nutrient needs related to wound healing as evidenced by estimated needs.  Ongoing  GOAL:   Patient will meet greater than or equal to 90% of their needs  Progressing   MONITOR:   PO intake, Supplement acceptance, Labs, Weight trends, Skin, I & O's  REASON FOR ASSESSMENT:   Consult Wound healing  ASSESSMENT:   Luke [name] is a 39 y.o. male with medical history significant of type 1 DM; IVDA/polysubstance abuse; seizures; PVD s/p L great toe amputation; hypothyroidism; HTN; endocarditis; and stage 3 CKD presenting with SOB.  6/15- s/p Procedure(s): IRRIGATION AND DEBRIDEMENT FOREARM WOUNDS (Bilateral) APPLICATION OF MESHED PRIMATRIX AG OF EXTREMITY (Bilateral)  Reviewed I/O's: -535 ml x 24 hours and -401 ml since admission  UOP: 775 ml x 24 hours  Attempted to speak with pt x 3, today, however, pt either receiving nursing care or down in OR for procedure.   Pt's intake has declined over the weekend. Noted meal completion now 0-25%. Pt intermittently refusing supplements.   Labs reviewed: K: 3.3., CBGS: 702-637 (inpatient orders for glycemic control are 0-15 units inus;lin aspart TID with meals, 0-5 units insulin aspart daily at bedtime, 4 units insulin aspart  TID with meals, and 45 units insulin glargine daily at bedtime).   Diet Order:   Diet Order            Diet NPO time specified  Diet effective now                 EDUCATION NEEDS:   No education needs have been identified at this time  Skin:  Skin Assessment: Skin Integrity Issues: Skin Integrity Issues:: Other (Comment) Other: lt second toe full thickness wound with possible exposed joint; highly necrotic, full thickness BUE wounds related to IV drug abuse  Last BM:  08/31/19  Height:   Ht Readings from Last 1 Encounters:  08/27/19 5\' 9"  (1.753 m)    Weight:   Wt Readings from Last 1 Encounters:  09/01/19 74.1 kg    Ideal Body Weight:  72.7 kg  BMI:  Body mass index is 24.13 kg/m.  Estimated Nutritional Needs:   Kcal:  8588-5027  Protein:  130-145 grams  Fluid:  > 2.2 L    Loistine Chance, RD, LDN, [place] Registered Dietitian II Certified Diabetes Care and Education Specialist Please refer to [facility] for RD and/or RD on-call/weekend/after hours pager

## 2019-09-01 NOTE — Progress Notes (Signed)
During bedside report the patient is assessed to have a water bottle with brown liquid. This RN inquired if the patient was dipping. Patient admitted that he had been dipping the whole time in the hospital and that 'his doctors know and his nurses know and it hasn't been a problem'. Patient informed that he cannot continue any nicotine products other than prescribed while in the hospital and that it is policy to remove items as necessary. Patient had a can of dip at bedside as well as an e-cigarette/vape.  Both placed in a secured bag in patient chart. Patient threatening to leave AMA after his surgery today. MD made aware

## 2019-09-01 NOTE — Brief Op Note (Signed)
09/01/2019  2:28 PM  PATIENT:  Luke [name]  39 y.o. male  PRE-OPERATIVE DIAGNOSIS:  BILATERAL FOREARM WOUNDS  POST-OPERATIVE DIAGNOSIS:  BILATERAL FOREARM WOUNDS  PROCEDURE:  Procedure(s): IRRIGATION AND DEBRIDEMENT FOREARM WOUNDS (Bilateral) APPLICATION OF MESHED PRIMATRIX AG OF EXTREMITY (Bilateral)  SURGEON:  Surgeon(s) and Role:    * Calix Heinbaugh, Steffanie Dunn, MD - Primary  PHYSICIAN ASSISTANT: Phoebe Sharps, PA  ASSISTANTS: none   ANESTHESIA:   general  EBL:  40   BLOOD ADMINISTERED:none  DRAINS: none   LOCAL MEDICATIONS USED:  MARCAINE     SPECIMEN:  No Specimen  DISPOSITION OF SPECIMEN:  N/A  COUNTS:  YES  TOURNIQUET:  * No tourniquets in log *  DICTATION: .Dragon Dictation  PLAN OF CARE: Admit to inpatient   PATIENT DISPOSITION:  PACU - hemodynamically stable.   Delay start of Pharmacological VTE agent (>24hrs) due to surgical blood loss or risk of bleeding: not applicable

## 2019-09-01 NOTE — Progress Notes (Signed)
Inpatient Diabetes Program Recommendations  AACE/ADA: New Consensus Statement on Inpatient Glycemic Control (2015)  Target Ranges:  Prepandial:   less than 140 mg/dL      Peak postprandial:   less than 180 mg/dL (1-2 hours)      Critically ill patients:  140 - 180 mg/dL   Lab Results  Component Value Date   GLUCAP 68 (L) 09/01/2019   HGBA1C 10.8 (H) 08/27/2019    Review of Glycemic Control  Results for ELIAS, DENNINGTON (MRN [id number]) as of 09/01/2019 14:58  Ref. Range 09/01/2019 06:35 09/01/2019 10:17 09/01/2019 10:44 09/01/2019 12:40 09/01/2019 14:29  Glucose-Capillary Latest Ref Range: 70 - 99 mg/dL 119 (H) 63 (L) 159 (H) 85 68 (L)  Diabetes history: DM type 1 Outpatient Diabetes medications: Lantus 40 units, Humalog 5-15 units tid  Current orders for Inpatient glycemic control: Lantus 45 units Novolog 0-15 units tid + hs, Novolog 4 units tid with meals (hold if patient eats less than 50%)  Inpatient Diabetes Program Recommendations:    Please consider reducing Novolog correction to sensitive tid with meals.  Also consider reducing Lantus to 40 units q HS.   Thanks  Adah Perl, RN, BC-ADM Inpatient Diabetes Coordinator Pager [phone] (8a-5p)

## 2019-09-01 NOTE — Progress Notes (Signed)
[facility] PROGRESS NOTE   Luke [name] LKT:[id number] DOB: 1980/05/08 DOA: 08/27/2019  PCP: Gildardo Pounds, NP  Brief History/Interval Summary: 39 y.o. male with medical history significant of type 1 DM; IVDA/polysubstance abuse; seizures; PVD s/p L great toe amputation; hypothyroidism; HTN; endocarditis; and stage 3 CKD presenting with SOB.  Of note, patient presented to the ER on 6/3 for L arm cellulitis with deep ulcer but no osteo; plastic surgery recommended surgical intervention and the patient left AMA on 6/4.  He reports that he "couldn't breathe at all."  He was driving and had to pull over and his friend brought him to the hospital.  He didn't think he was going to make it.  He had substernal chest pain and it is still there.  He was driving when it started.  Nothing makes it better or worse.  Cough during episode, better now.  No fevers.  This is not similar to his prior endocarditis - this was much more severe.  He uses cocaine and heroin.  He uses heroin daily.  The Lasix made him pee a lot and his breathing improved.  Last cocaine use was the day prior to admission.    Consultants:  Infectious disease Phone conversation with cardiology Plastic surgeon  Procedures:   Transthoracic echocardiogram IMPRESSIONS  1. Mitral and pulmonic valve vegetations. Endocarditis.  2. Left ventricular ejection fraction, by estimation, is 70 to 75%. The  left ventricle has hyperdynamic function. The left ventricle has no  regional wall motion abnormalities. Left ventricular diastolic parameters  were normal.  3. Right ventricular systolic function is normal. The right ventricular  size is normal.  4. There is a anterior mitral leaflet valvular vegetation (1.1 x 0.6 cm).  The mitral valve is abnormal. Moderate mitral valve regurgitation. No  evidence of mitral stenosis.  5. The aortic valve is normal in structure. Aortic valve regurgitation is  mild. No aortic stenosis is  present.  6. Possible vegetation on pulmonic valve. The pulmonic valve was  abnormal.  7. The inferior vena cava is normal in size with greater than 50%  respiratory variability, suggesting right atrial pressure of 3 mmHg.    Antibiotics: Anti-infectives (From admission, onward)   Start     Dose/Rate Route Frequency Ordered Stop   08/30/19 1800  [MAR Hold]  vancomycin (VANCOREADY) IVPB 750 mg/150 mL     Discontinue     (MAR Hold since Tue 09/01/2019 at 1035.Hold Reason: Transfer to a Procedural area.)   750 mg 150 mL/hr over 60 Minutes Intravenous Every 12 hours 08/30/19 0731     08/28/19 0630  vancomycin (VANCOCIN) IVPB 1000 mg/200 mL premix  Status:  Discontinued        1,000 mg 200 mL/hr over 60 Minutes Intravenous Every 12 hours 08/27/19 1742 08/30/19 0731   08/27/19 1800  vancomycin (VANCOREADY) IVPB 1500 mg/300 mL        1,500 mg 150 mL/hr over 120 Minutes Intravenous  Once 08/27/19 1742 08/27/19 2230   08/27/19 1800  [MAR Hold]  ceFEPIme (MAXIPIME) 2 g in sodium chloride 0.9 % 100 mL IVPB     Discontinue     (MAR Hold since Tue 09/01/2019 at 1035.Hold Reason: Transfer to a Procedural area.)   2 g 200 mL/hr over 30 Minutes Intravenous Every 8 hours 08/27/19 1742        Subjective/Interval History: Patient very upset this morning since his tobacco snuff was taken away from home.  He was told that  this is hospital policy.  Denies any complaints at this time.  Initially was threatening to leave but he was convinced to stay back.      Assessment/Plan:  Infective endocarditis/moderate mitral regurgitation Patient admitted back in February for infective endocarditis.  However he left AGAINST MEDICAL ADVICE after he spent about 8 days in the hospital.  Blood cultures at that time grew Streptococcus mitis/oralis.  Patient presented on June 3 and again left AGAINST MEDICAL ADVICE.   Echocardiogram once again shows evidence for mitral valve vegetation with moderate regurgitation.   Possible vegetation also noted on the pulmonic valve.  Ejection fraction is noted to be normal.   Continue broad-spectrum antibiotics with vancomycin and cefepime for now.  Blood cultures are negative.  Infectious diseases following. The echo findings were discussed with Dr. Harrell Gave with cardiology on 6/12.  MR is not severe enough for any current intervention at this time.  May need to [facility] cardiothoracic surgery at some point in time in the next week or so but does not again need any urgent intervention currently.  The size of his vegetation has not changed much compared to the TEE report from February.  Of note patient was seen by Dr. Kipp Brood with cardiothoracic surgery back in February and at that time he was not a good candidate for surgery.  Based on patient's lifestyle does not appear like he is a candidate even now. WBC is stable.  Afebrile.  Await further input from ID regarding antibiotics.  Necrosis of the right forearm Patient has wounds in both of his forearms right greater than left.  There is evidence for purulent wound involving the right forearm.  Skin necrosis is also noted.  He was scheduled to go to the operating room on June 7 for plastic surgery to be done by Dr. Claudia Desanctis but apparently patient did not show up.  Plastic surgery has been reconsulted.  Patient to go to surgery this afternoon.  Diabetes mellitus type 1, uncontrolled, with hyperglycemia HbA1c 10.8.  He had episode of hypoglycemia a few days ago.  Dose of insulin was adjusted.  Glucose levels have been stable however noted to have another low glucose level this morning.  Most likely due to n.p.o. status.  Holding insulin.  Resume from tomorrow.  Continue SSI.    Chronic kidney disease stage IIIa Seems to be close to his baseline renal function.  Monitor urine output.  Avoid nephrotoxic agents.  Ulcer involving the left second toe Patient with prior history of left first toe amputation.  Wound care.  History  of IV drug abuse/polysubstance abuse He has longstanding heroin dependence.  Patient was previously on Suboxone but he is not interested in this treatment as of now.  He is on clonidine taper.  Normocytic anemia Likely due to endocarditis, chronic disease, IV drug use.  No evidence for overt bleeding.  Substance induced mood disorder Continue home medications  History of hepatitis C HCV quant was 5,170,000 on 2/24.  And ID follow-up.  Essential hypertension Patient noted to be on amlodipine and carvedilol.  Blood pressure is reasonably well controlled.  Tobacco dependence Cessation was encouraged.  Nicotine patch.  Hypothyroidism Continue levothyroxine.  Hyperlipidemia Continue Lipitor   DVT Prophylaxis: SCDs Code Status: Full code Family Communication: Discussed with the patient Disposition Plan:  Status is: Inpatient  Remains inpatient appropriate because:IV treatments appropriate due to intensity of illness or inability to take PO   Dispo:  Patient From: Home  Planned Disposition: To be determined  Expected discharge date: 09/04/19  Medically stable for discharge: No     Medications:  Scheduled:  [MAR Hold] amLODipine  5 mg Oral Daily   [MAR Hold] atorvastatin  20 mg Oral Daily   [MAR Hold] carvedilol  12.5 mg Oral BID WC   [MAR Hold] Chlorhexidine Gluconate Cloth  6 each Topical Q0600   [MAR Hold] cloNIDine  0.1 mg Oral QAC breakfast   [MAR Hold] docusate sodium  100 mg Oral BID   [MAR Hold] DULoxetine  30 mg Oral Daily   [MAR Hold] enoxaparin (LOVENOX) injection  40 mg Subcutaneous Q24H   [MAR Hold] gabapentin  400 mg Oral TID   [MAR Hold] insulin aspart  0-15 Units Subcutaneous TID WC   [MAR Hold] insulin aspart  0-5 Units Subcutaneous QHS   [MAR Hold] insulin aspart  4 Units Subcutaneous TID WC   [MAR Hold] insulin glargine  45 Units Subcutaneous QHS   [MAR Hold] levothyroxine  50 mcg Oral Q0600   [MAR Hold] multivitamin with minerals   1 tablet Oral Daily   [MAR Hold] mupirocin ointment  1 application Nasal BID   [MAR Hold] nicotine  14 mg Transdermal Daily   [MAR Hold] nutrition supplement (JUVEN)  1 packet Oral BID BM   [MAR Hold] pantoprazole  40 mg Oral Daily   [MAR Hold] Ensure Max Protein  11 oz Oral QHS   [MAR Hold] sertraline  50 mg Oral Daily   [MAR Hold] sodium chloride flush  10-40 mL Intracatheter Q12H   [MAR Hold] sodium chloride flush  3 mL Intravenous Q12H   [MAR Hold] traZODone  50 mg Oral QHS   Continuous:  [MAR Hold] sodium chloride 250 mL (08/31/19 1407)   [MAR Hold] ceFEPime (MAXIPIME) IV 2 g (09/01/19 0432)   lactated ringers     [MAR Hold] potassium chloride 10 mEq (09/01/19 1009)   [MAR Hold] vancomycin 750 mg (09/01/19 0552)   PRN:[MAR Hold] sodium chloride, [MAR Hold] acetaminophen **OR** [MAR Hold] acetaminophen, [MAR Hold] dicyclomine, [MAR Hold] hydrALAZINE, [MAR Hold] hydrOXYzine, [MAR Hold] loperamide, [MAR Hold] melatonin, [MAR Hold] methocarbamol, [MAR Hold]  morphine injection, [MAR Hold] ondansetron **OR** [MAR Hold] ondansetron (ZOFRAN) IV, [MAR Hold] ondansetron, [MAR Hold] polyethylene glycol, [MAR Hold] sodium chloride flush   Objective:  Vital Signs  Vitals:   08/31/19 0830 08/31/19 1635 08/31/19 2046 09/01/19 0408  BP: 119/69 116/80 114/73 124/78  Pulse: 84 86 83 88  Resp: 18 18 18 18   Temp: 98.5 F (36.9 C) 98.3 F (36.8 C) 98.3 F (36.8 C) 98.5 F (36.9 C)  TempSrc: Oral Oral Oral Oral  SpO2: 100% 96% 97% 98%  Weight:    74.1 kg  Height:        Intake/Output Summary (Last 24 hours) at 09/01/2019 1122 Last data filed at 09/01/2019 0413 Gross per 24 hour  Intake 240 ml  Output 325 ml  Net -85 ml   Filed Weights   08/31/19 0028 09/01/19 0408  Weight: 74.3 kg 74.1 kg   General appearance: Awake alert.  In no distress Resp: Clear to auscultation bilaterally.  Normal effort Cardio: S1-S2 is normal regular.  No S3-S4.  Systolic murmur appreciated  over the precordium GI: Abdomen is soft.  Nontender nondistended.  Bowel sounds are present normal.  No masses organomegaly Extremities: Forearms covered in dressing. Neurologic: Alert and oriented x3.  No focal neurological deficits.       Lab Results:  Data Reviewed: I have personally reviewed following labs and imaging  studies  CBC: Recent Labs  Lab 08/27/19 1415 08/27/19 1415 08/28/19 0509 08/29/19 0625 08/30/19 0500 08/31/19 1249 09/01/19 0451  WBC 10.4   < > 12.0* 14.2* 15.0* 16.6* 16.2*  NEUTROABS 7.3  --   --   --   --   --   --   HGB 8.5*   < > 8.5* 8.7* 9.2* 9.1* 9.2*  HCT 27.9*   < > 26.9* 27.1* 28.5* 28.2* 29.0*  MCV 84.3   < > 80.8 79.5* 80.1 80.3 81.0  PLT 505*   < > 540* 530* 558* 519* 547*   < > = values in this interval not displayed.    Basic Metabolic Panel: Recent Labs  Lab 08/28/19 0509 08/29/19 0625 08/30/19 0500 08/31/19 1249 09/01/19 0451  NA 136 134* 135 137 139  K 3.2* 3.7 3.5 3.9 3.3*  CL 102 104 104 109 110  CO2 25 21* 22 20* 20*  GLUCOSE 261* 224* 196* 127* 164*  BUN 14 15 14 15 13   CREATININE 1.51* 1.40* 1.35* 1.45* 1.34*  CALCIUM 8.6* 8.7* 8.7* 8.6* 8.5*    GFR: Estimated Creatinine Clearance: 74 mL/min (A) (by C-G formula based on SCr of 1.34 mg/dL (H)).  Liver Function Tests: Recent Labs  Lab 08/27/19 1415 08/29/19 0625  AST 16 13*  ALT 14 12  ALKPHOS 103 96  BILITOT 0.7 0.4  PROT 6.3* 6.1*  ALBUMIN 2.6* 2.6*    HbA1C: No results for input(s): HGBA1C in the last 72 hours.  CBG: Recent Labs  Lab 08/31/19 1635 08/31/19 2131 09/01/19 0635 09/01/19 1017 09/01/19 1044  GLUCAP 148* 140* 119* 63* 159*      Recent Results (from the past 240 hour(s))  SARS Coronavirus 2 by RT PCR (hospital order, performed in [facility] hospital lab) Nasopharyngeal Nasopharyngeal Swab     Status: None   Collection Time: 08/27/19  5:42 PM   Specimen: Nasopharyngeal Swab  Result Value Ref Range Status   SARS Coronavirus 2  NEGATIVE NEGATIVE Final    Comment: (NOTE) SARS-CoV-2 target nucleic acids are NOT DETECTED.  The SARS-CoV-2 RNA is generally detectable in upper and lower respiratory specimens during the acute phase of infection. The lowest concentration of SARS-CoV-2 viral copies this assay can detect is 250 copies / mL. A negative result does not preclude SARS-CoV-2 infection and should not be used as the sole basis for treatment or other patient management decisions.  A negative result may occur with improper specimen collection / handling, submission of specimen other than nasopharyngeal swab, presence of viral mutation(s) within the areas targeted by this assay, and inadequate number of viral copies (<250 copies / mL). A negative result must be combined with clinical observations, patient history, and epidemiological information.  Fact Sheet for Patients:   StrictlyIdeas.no  Fact Sheet for Healthcare Providers: BankingDealers.co.za  This test is not yet approved or  cleared by the [place] FDA and has been authorized for detection and/or diagnosis of SARS-CoV-2 by FDA under an Emergency Use Authorization (EUA).  This EUA will remain in effect (meaning this test can be used) for the duration of the COVID-19 declaration under Section 564(b)(1) of the Act, 21 U.S.C. section 360bbb-3(b)(1), unless the authorization is terminated or revoked sooner.  Performed at [facility], [place] [address], [place], [place] [address]   Blood Cultures x 2 sites     Status: None   Collection Time: 08/27/19  9:36 PM   Specimen: BLOOD RIGHT WRIST  Result  Value Ref Range Status   Specimen Description BLOOD RIGHT WRIST  Final   Special Requests   Final    BOTTLES DRAWN AEROBIC AND ANAEROBIC Blood Culture adequate volume   Culture   Final    NO GROWTH 5 DAYS Performed at [facility], [address] [place], [place] [address]    Report Status  09/01/2019 FINAL  Final  Blood Cultures x 2 sites     Status: None   Collection Time: 08/27/19  9:46 PM   Specimen: BLOOD LEFT WRIST  Result Value Ref Range Status   Specimen Description BLOOD LEFT WRIST  Final   Special Requests   Final    BOTTLES DRAWN AEROBIC AND ANAEROBIC Blood Culture adequate volume   Culture   Final    NO GROWTH 5 DAYS Performed at [facility], [place] [address] [place], [place] [address]    Report Status 09/01/2019 FINAL  Final  Surgical pcr screen     Status: Abnormal   Collection Time: 08/31/19 12:19 PM   Specimen: Nasal Mucosa; Nasal Swab  Result Value Ref Range Status   MRSA, PCR POSITIVE (A) NEGATIVE Final    Comment: RESULT CALLED TO, READ BACK BY AND VERIFIED WITH: Octavio Manns RN 14:30 08/31/19 ([email or web address])    Staphylococcus aureus POSITIVE (A) NEGATIVE Final    Comment: (NOTE) The Xpert SA Assay (FDA approved for NASAL specimens in patients 32 years of age and older), is one component of a comprehensive surveillance program. It is not intended to diagnose infection nor to guide or monitor treatment. Performed at [facility], [place] [address], [place], [place] [address]       Radiology Studies: No results found.     LOS: 5 days   Christian Borgerding [employer] on [email or web address]  09/01/2019, 11:22 AM

## 2019-09-01 NOTE — Progress Notes (Signed)
Patient complaining of being anxious.  Received verbal order to give 2 mg Versed.  Patient resting. Vitals stable.

## 2019-09-01 NOTE — Interval H&P Note (Signed)
History and Physical Interval Note:  09/01/2019 11:56 AM  Luke [name]  has presented today for surgery, with the diagnosis of BILATERAL FOREARM WOUNDS.  The various methods of treatment have been discussed with the patient and family. After consideration of risks, benefits and other options for treatment, the patient has consented to  Procedure(s): IRRIGATION AND DEBRIDEMENT FOREARM WOUNDS (Bilateral) APPLICATION OF MESHED [place] (Bilateral) as a surgical intervention.  The patient's history has been reviewed, patient examined, no change in status, stable for surgery.  I have reviewed the patient's chart and labs.  Questions were answered to the patient's satisfaction.     Cindra Presume

## 2019-09-01 NOTE — Progress Notes (Addendum)
Hypoglycemic Event  CBG: 63  Treatment: Amp of D50  Symptoms: No symptoms  Follow-up CBG: Time: to be done in OR  CBG Result: to be done in the OR  Possible Reasons for Event: Patient has been NPO since midnight with no supplemental IV dextrose  Comments/MD notified:OR called and given update on patient CBG and report of treatment. Will do a follow up CBG in the [place]

## 2019-09-01 NOTE — Progress Notes (Signed)
Patient is a 39 yr-old male who underwent bilateral forearm debridements with placement of Primatrix AG.  Patient tolerated procedure well.  Do daily dressing changes to bilateral arms consisting of application of surgical lube to mepitel mesh (white mesh), cover with gauze, and wrap with kerlix and ACE wrap. Do not remove mepitel mesh.

## 2019-09-01 NOTE — Anesthesia Preprocedure Evaluation (Addendum)
Anesthesia Evaluation  Patient identified by MRN, date of birth, ID band Patient awake    Reviewed: Allergy & Precautions, H&P , NPO status , Patient's Chart, lab work & pertinent test results  Airway Mallampati: II  TM Distance: >3 FB Neck ROM: Full    Dental no notable dental hx. (+) Poor Dentition, Dental Advisory Given   Pulmonary Current Smoker and Patient abstained from smoking.,    Pulmonary exam normal breath sounds clear to auscultation       Cardiovascular hypertension, Pt. on medications and Pt. on home beta blockers  Rhythm:Regular Rate:Normal + Systolic murmurs    Neuro/Psych  Headaches, Seizures -, Well Controlled,  Anxiety    GI/Hepatic Neg liver ROS, GERD  Medicated and Controlled,  Endo/Other  diabetes, Type 1, Insulin DependentHypothyroidism   Renal/GU Renal disease  negative genitourinary   Musculoskeletal   Abdominal   Peds  Hematology  (+) Blood dyscrasia, anemia ,   Anesthesia Other Findings   Reproductive/Obstetrics negative OB ROS                            Anesthesia Physical Anesthesia Plan  ASA: III  Anesthesia Plan: General   Post-op Pain Management:    Induction: Intravenous  PONV Risk Score and Plan: 2 and Ondansetron and Midazolam  Airway Management Planned: LMA  Additional Equipment:   Intra-op Plan:   Post-operative Plan: Extubation in OR  Informed Consent: I have reviewed the patients History and Physical, chart, labs and discussed the procedure including the risks, benefits and alternatives for the proposed anesthesia with the patient or authorized representative who has indicated his/her understanding and acceptance.     Dental advisory given  Plan Discussed with: CRNA  Anesthesia Plan Comments:         Anesthesia Quick Evaluation

## 2019-09-01 NOTE — Transfer of Care (Signed)
Immediate Anesthesia Transfer of Care Note  [name]  Procedure(s) Performed: IRRIGATION AND DEBRIDEMENT FOREARM WOUNDS (Bilateral Arm Lower) APPLICATION OF MESHED PRIMATRIX AG OF EXTREMITY (Bilateral Arm Lower)  Patient Location: PACU  Anesthesia Type:General  Level of Consciousness: awake and alert   Airway & Oxygen Therapy: Patient Spontanous Breathing  Post-op Assessment: Report given to RN and Post -op Vital signs reviewed and stable  Post vital signs: Reviewed and stable  Last Vitals:  Vitals Value Taken Time  BP    Temp    Pulse 45 09/01/19 1427  Resp 20 09/01/19 1427  SpO2 87 % 09/01/19 1427  Vitals shown include unvalidated device data.  Last Pain:  Vitals:   09/01/19 0923  TempSrc:   PainSc: 9       Patients Stated Pain Goal: 0 (15/18/34 3735)  Complications: No complications documented.

## 2019-09-01 NOTE — Progress Notes (Signed)
Dr. Claudia Desanctis aware of midline catheter in left arm. [place] for surgery access.

## 2019-09-02 ENCOUNTER — Encounter (HOSPITAL_COMMUNITY): Payer: Self-pay | Admitting: Plastic Surgery

## 2019-09-02 DIAGNOSIS — I058 Other rheumatic mitral valve diseases: Secondary | ICD-10-CM

## 2019-09-02 DIAGNOSIS — I378 Other nonrheumatic pulmonary valve disorders: Secondary | ICD-10-CM

## 2019-09-02 LAB — BASIC METABOLIC PANEL
Anion gap: 7 (ref 5–15)
BUN: 16 mg/dL (ref 6–20)
CO2: 21 mmol/L — ABNORMAL LOW (ref 22–32)
Calcium: 8.6 mg/dL — ABNORMAL LOW (ref 8.9–10.3)
Chloride: 112 mmol/L — ABNORMAL HIGH (ref 98–111)
Creatinine, Ser: 1.46 mg/dL — ABNORMAL HIGH (ref 0.61–1.24)
GFR calc Af Amer: >60 mL/min (ref >60)
GFR calc non Af Amer: 60 mL/min — ABNORMAL LOW (ref >60)
Glucose, Bld: 71 mg/dL (ref 70–99)
Potassium: 3.8 mmol/L (ref 3.5–5.1)
Sodium: 140 mmol/L (ref 135–145)

## 2019-09-02 LAB — CBC
HCT: 28.7 % — ABNORMAL LOW (ref 39.0–52.0)
Hemoglobin: 9.1 g/dL — ABNORMAL LOW (ref 13.0–17.0)
MCH: 26.1 pg (ref 26.0–34.0)
MCHC: 31.7 g/dL (ref 30.0–36.0)
MCV: 82.2 fL (ref 80.0–100.0)
Platelets: 505 10*3/uL — ABNORMAL HIGH (ref 150–400)
RBC: 3.49 MIL/uL — ABNORMAL LOW (ref 4.22–5.81)
RDW: 14.8 % (ref 11.5–15.5)
WBC: 16.2 10*3/uL — ABNORMAL HIGH (ref 4.0–10.5)
nRBC: 0 % (ref 0.0–0.2)

## 2019-09-02 LAB — GLUCOSE, CAPILLARY
Glucose-Capillary: 43 mg/dL — CL (ref 70–99)
Glucose-Capillary: 110 mg/dL — ABNORMAL HIGH (ref 70–99)
Glucose-Capillary: 191 mg/dL — ABNORMAL HIGH (ref 70–99)
Glucose-Capillary: 197 mg/dL — ABNORMAL HIGH (ref 70–99)

## 2019-09-02 MED ORDER — MORPHINE SULFATE ER 15 MG PO TBCR
30.0000 mg | EXTENDED_RELEASE_TABLET | Freq: Two times a day (BID) | ORAL | Status: DC
  Administered 2019-09-02 – 2019-09-09 (×15): 30 mg via ORAL
  Filled 2019-09-02 (×15): qty 2

## 2019-09-02 MED ORDER — HYDROMORPHONE HCL 1 MG/ML IJ SOLN
2.0000 mg | INTRAMUSCULAR | Status: DC | PRN
  Administered 2019-09-02 – 2019-09-05 (×22): 2 mg via INTRAVENOUS
  Filled 2019-09-02 (×22): qty 2

## 2019-09-02 NOTE — Progress Notes (Signed)
Inpatient Diabetes Program Recommendations  AACE/ADA: New Consensus Statement on Inpatient Glycemic Control (2015)  Target Ranges:  Prepandial:   less than 140 mg/dL      Peak postprandial:   less than 180 mg/dL (1-2 hours)      Critically ill patients:  140 - 180 mg/dL   Lab Results  Component Value Date   GLUCAP 110 (H) 09/02/2019   HGBA1C 10.8 (H) 08/27/2019    Review of Glycemic Control Results for Luke [name], Luke [name] (MRN [id number]) as of 09/02/2019 13:07  Ref. Range 09/01/2019 15:40 09/01/2019 21:26 09/01/2019 22:19 09/02/2019 06:32 09/02/2019 12:25  Glucose-Capillary Latest Ref Range: 70 - 99 mg/dL 99 444 (H) 259 (H) 43 (LL) 110 (H)  Diabetes history:DM type 1 Outpatient Diabetes medications:Lantus 40 units, Humalog 5-15 units tid Current orders for Inpatient glycemic control:Lantus 40 units q HS, Novolog 0-15 units tid + hs Inpatient Diabetes Program Recommendations:   Looks like patient received Novolog 6 units last PM for CBG>400 however blood sugar was rechecked and down to 259 mg/dL?? Patient only received Lantus 20 units last PM and then blood sugars low this morning. Please consider reduction of Lantus back to 20 units q HS.  Thanks,  Adah Perl, RN, BC-ADM Inpatient Diabetes Coordinator Pager [phone] (8a-5p)

## 2019-09-02 NOTE — Progress Notes (Signed)
Dressing change complete. Mepitel mesh topped with surgical gel, gauze, kerilex and compressing wrap. Moderate serous drainage noted on dressing placed yesterday but no apparent active bleeding or drainage. Patient tolerated well

## 2019-09-02 NOTE — Progress Notes (Signed)
[facility]  Date of Admission:  08/27/2019      Total days of antibiotics 6   Vancomycin + Cefepime     ASSESSMENT: Luke [name] is a 39 y.o. male with chronic necrotic wounds to bilateral arms and vegetations noted on both native mitral and pulmonic valves. He has a loud systolic murmur heard best at apex--TTE indicated EF 75% and moderate regurgitation; I would arrange follow up echocardiogram at the end of 4 weeks to reassess EF and valves. Cardiology has seen him, referral back should he experience heart failure symptoms related to severe MR.   His blood cultures are negative, final. We can probably transition to Vanc + Ceftriaxone to treat his endocarditis, but will await follow up wound checks before changing. No cultures were taken from arms during debridement, no evidence of deeper infection to underlying bones.  Would recommend 4 weeks of antibiotics, which he is open to staying here for at this time.   I think with regards to Suboxone for opioid replacement, he has never been properly attempted on this to help manage withdrawals; he would be open to using this to help bridge him to drug rehab program - would recommend LCSW / CM to discuss outpatient resources for him if not already done.   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    PLAN: 1. Continue cefepime + vancomycin for now 2. Appreciate any assistance from CM/LCSW for drug rehab coordination  3. Follow surgery for wound status  4. Hep C tx outpatient      Principal Problem:   Endocarditis of mitral valve Active Problems:   Drug abuse, IV ([name])   Tobacco abuse   CKD (chronic kidney disease) stage 3, GFR 30-59 ml/min   Uncontrolled insulin dependent type 1 diabetes mellitus ([name])   Diabetic ulcer of left foot ([name])   Hypothyroidism   Heroin abuse ([name])   Congestive heart failure (CHF) ([name])   Arm wound    amLODipine  5 mg Oral  Daily   atorvastatin  20 mg Oral Daily   carvedilol  12.5 mg Oral BID WC   Chlorhexidine Gluconate Cloth  6 each Topical Q0600   cloNIDine  0.1 mg Oral QAC breakfast   docusate sodium  100 mg Oral BID   DULoxetine  30 mg Oral Daily   enoxaparin (LOVENOX) injection  40 mg Subcutaneous Q24H   gabapentin  400 mg Oral TID   insulin aspart  0-15 Units Subcutaneous TID WC   insulin aspart  0-5 Units Subcutaneous QHS   insulin glargine  40 Units Subcutaneous QHS   levothyroxine  50 mcg Oral Q0600   morphine  30 mg Oral Q12H   multivitamin with minerals  1 tablet Oral Daily   mupirocin ointment  1 application Nasal BID   nicotine  14 mg Transdermal Daily   nutrition supplement (JUVEN)  1 packet Oral BID BM   pantoprazole  40 mg Oral Daily   Ensure Max Protein  11 oz Oral QHS   sertraline  50 mg Oral Daily   sodium chloride flush  10-40 mL Intracatheter Q12H   sodium chloride flush  3 mL Intravenous Q12H   traZODone  50 mg Oral QHS    SUBJECTIVE: Doing OK today but having pain in the arms from debridement yesterday. Has noticed more shortness of breath with activities since last hospitalization in February for endocarditis  of the mitral valve.   He states that he would be willing to stay a month if needed to treat this infection . He has in the past used Suboxone off and on from street source to help with heroin withdrawls but has never been on it consistently. Not currently experiencing withdrawals with current regimen of pain medications. He states that he would like to officially participate in drug rehab and hopeful we can help get him to a program.   Prior to hospitalization he was not having any fevers/chills or diaphoresis. He did continue to inject through open wounds in forearms to access veins.    Review of Systems: Review of Systems  Constitutional: Negative for chills, diaphoresis and fever.  HENT: Negative for tinnitus.   Eyes: Negative for blurred  vision and photophobia.  Respiratory: Positive for shortness of breath. Negative for cough and sputum production.   Cardiovascular: Negative for chest pain and leg swelling.  Gastrointestinal: Negative for diarrhea, nausea and vomiting.  Genitourinary: Negative for dysuria.  Musculoskeletal: Negative for joint pain and myalgias.  Skin: Negative for rash.       Chronic wounds to b/l forearms   Neurological: Negative for weakness and headaches.    No Known Allergies  OBJECTIVE: Vitals:   09/01/19 2044 09/02/19 0523 09/02/19 0541 09/02/19 0818  BP: 131/79 125/85  128/85  Pulse: 95 90  91  Resp: 20 18    Temp: 98.6 F (37 C) 98.2 F (36.8 C)    TempSrc: Oral Oral    SpO2: 95% 98%    Weight:   76.2 kg   Height:       Body mass index is 24.81 kg/m.  Physical Exam Vitals reviewed.  Constitutional:      Appearance: Normal appearance. He is not ill-appearing.     Comments: Resting in bed comfortably.   HENT:     Mouth/Throat:     Mouth: Mucous membranes are moist.     Pharynx: Oropharynx is clear.  Eyes:     General: No scleral icterus. Cardiovascular:     Rate and Rhythm: Normal rate and regular rhythm.     Pulses: Normal pulses.     Heart sounds: Murmur (3/6 systolic murmur ) heard.   Pulmonary:     Effort: Pulmonary effort is normal.     Breath sounds: Normal breath sounds.  Chest:     Chest wall: No tenderness.  Abdominal:     General: Bowel sounds are normal. There is no distension.     Palpations: Abdomen is soft. There is no fluid wave, hepatomegaly or splenomegaly.     Tenderness: There is no abdominal tenderness.  Musculoskeletal:        General: No swelling.     Right lower leg: No edema.     Left lower leg: No edema.     Comments: B/L Forearms wrapped in clean and dry ace wraps  Skin:    Capillary Refill: Capillary refill takes less than 2 seconds.     Coloration: Skin is not jaundiced.     Findings: No rash.  Neurological:     Mental Status: He is  alert and oriented to person, place, and time.  Psychiatric:        Behavior: Behavior normal.        Judgment: Judgment normal.     Lab Results Lab Results  Component Value Date   WBC 16.2 (H) 09/02/2019   HGB 9.1 (L) 09/02/2019   HCT 28.7 (L)  09/02/2019   MCV 82.2 09/02/2019   PLT 505 (H) 09/02/2019    Lab Results  Component Value Date   CREATININE 1.46 (H) 09/02/2019   BUN 16 09/02/2019   NA 140 09/02/2019   K 3.8 09/02/2019   CL 112 (H) 09/02/2019   CO2 21 (L) 09/02/2019    Lab Results  Component Value Date   ALT 12 08/29/2019   AST 13 (L) 08/29/2019   ALKPHOS 96 08/29/2019   BILITOT 0.4 08/29/2019     Microbiology: Recent Results (from the past 240 hour(s))  SARS Coronavirus 2 by RT PCR (hospital order, performed in [facility] hospital lab) Nasopharyngeal Nasopharyngeal Swab     Status: None   Collection Time: 08/27/19  5:42 PM   Specimen: Nasopharyngeal Swab  Result Value Ref Range Status   SARS Coronavirus 2 NEGATIVE NEGATIVE Final    Comment: (NOTE) SARS-CoV-2 target nucleic acids are NOT DETECTED.  The SARS-CoV-2 RNA is generally detectable in upper and lower respiratory specimens during the acute phase of infection. The lowest concentration of SARS-CoV-2 viral copies this assay can detect is 250 copies / mL. A negative result does not preclude SARS-CoV-2 infection and should not be used as the sole basis for treatment or other patient management decisions.  A negative result may occur with improper specimen collection / handling, submission of specimen other than nasopharyngeal swab, presence of viral mutation(s) within the areas targeted by this assay, and inadequate number of viral copies (<250 copies / mL). A negative result must be combined with clinical observations, patient history, and epidemiological information.  Fact Sheet for Patients:   StrictlyIdeas.no  Fact Sheet for Healthcare  Providers: BankingDealers.co.za  This test is not yet approved or  cleared by the [place] FDA and has been authorized for detection and/or diagnosis of SARS-CoV-2 by FDA under an Emergency Use Authorization (EUA).  This EUA will remain in effect (meaning this test can be used) for the duration of the COVID-19 declaration under Section 564(b)(1) of the Act, 21 U.S.C. section 360bbb-3(b)(1), unless the authorization is terminated or revoked sooner.  Performed at [facility], [place] [address] [place], [place] [address]   Blood Cultures x 2 sites     Status: None   Collection Time: 08/27/19  9:36 PM   Specimen: BLOOD RIGHT WRIST  Result Value Ref Range Status   Specimen Description BLOOD RIGHT WRIST  Final   Special Requests   Final    BOTTLES DRAWN AEROBIC AND ANAEROBIC Blood Culture adequate volume   Culture   Final    NO GROWTH 5 DAYS Performed at [facility], [place] [address] [place], [place] [address]    Report Status 09/01/2019 FINAL  Final  Blood Cultures x 2 sites     Status: None   Collection Time: 08/27/19  9:46 PM   Specimen: BLOOD LEFT WRIST  Result Value Ref Range Status   Specimen Description BLOOD LEFT WRIST  Final   Special Requests   Final    BOTTLES DRAWN AEROBIC AND ANAEROBIC Blood Culture adequate volume   Culture   Final    NO GROWTH 5 DAYS Performed at [facility], [place] [address] [place], [place] [address]    Report Status 09/01/2019 FINAL  Final  Surgical pcr screen     Status: Abnormal   Collection Time: 08/31/19 12:19 PM   Specimen: Nasal Mucosa; Nasal Swab  Result Value Ref Range Status   MRSA, PCR POSITIVE (A)  NEGATIVE Final    Comment: RESULT CALLED TO, READ BACK BY AND VERIFIED WITH: Octavio Manns RN 14:30 08/31/19 ([email or web address])    Staphylococcus aureus POSITIVE (A) NEGATIVE Final    Comment: (NOTE) The Xpert SA Assay (FDA approved for NASAL specimens in patients 84 years of age and older), is one  component of a comprehensive surveillance program. It is not intended to diagnose infection nor to guide or monitor treatment. Performed at [facility], [place] [address] [place], [place] [address]      [name], MSN, NP-C [facility] [email or web address] Pager: [phone] Office: [place]: [phone]

## 2019-09-02 NOTE — Progress Notes (Signed)
PROGRESS NOTE    Luke [name]  UJW:[id number] DOB: 1980/10/01 DOA: 08/27/2019 PCP: Gildardo Pounds, NP    Brief Narrative: 39 year old male with type 1 diabetes IV drug abuse polysubstance abuse seizures endocarditis stage III CKD peripheral vascular disease left great toe amputation and hypothyroidism and hypertension admitted with shortness of breath.  He was found to have left arm cellulitis with a deep ulcer on 08/20/2019 in the emergency room.  Plastic surgery recommended surgical intervention patient left AMA on 08/21/2019.  His friend brought him back to the hospital since patient had substernal chest pain and could not breathe.  Assessment & Plan:   Principal Problem:   Endocarditis of mitral valve Active Problems:   Drug abuse, IV ([name])   Tobacco abuse   CKD (chronic kidney disease) stage 3, GFR 30-59 ml/min   Uncontrolled insulin dependent type 1 diabetes mellitus ([place])   Diabetic ulcer of left foot ([name])   Hypothyroidism   Heroin abuse ([place])   Congestive heart failure (CHF) ([name])   Arm wound   #1 mitral valve endocarditis -echo shows mitral valve vegetation with moderate regurgitation.  Possible vegetation also on the pulmonic valve.  On vancomycin and cefepime.  ID following.  Recommending 4 weeks of antibiotic therapy. Previous attending discussed with cardiology on 08/29/2019.  It was thought that MR was not severe enough to do any surgical intervention at this time.  He was seen by Dr. Kipp Brood in the past and was deemed not a good candidate for surgery.  #2 right forearm necrosis status post surgery 09/01/2019 status post debridement of the right forearm and debridement of the left forearm.  #3 type 1 diabetes uncontrolled with hyperglycemia CBG (last 3) Lantus dose decreased to 20 units. Recent Labs    09/01/19 2219 09/02/19 0632 09/02/19 1225  GLUCAP 259* 43* 110*     #4 stage IIIa CKD stable monitor on vancomycin.  Creatinine 1.46.  #5 left second toe ulcer  continue wound care  #6 polysubstance abuse on clonidine taper.  He is not interested in quitting at this time.  #7 normocytic anemia stable no evidence of active bleeding  #8 hepatitis C outpatient ID follow-up  #9 history of essential hypertension on Norvasc and carvedilol.  Blood pressure 114/76.  #10 tobacco abuse continue nicotine patch  #11 hypothyroidism on levothyroxine  #12 hyperlipidemia on Lipitor.  #13 pain control I have started him on long-acting MS Contin and Dilaudid for breakthrough pain.  Nutrition Problem: Increased nutrient needs Etiology: wound healing     Signs/Symptoms: estimated needs    Interventions: MVI, Juven, Premier Protein  Estimated body mass index is 24.81 kg/m as calculated from the following:   Height as of this encounter: 5\' 9"  (1.753 m).   Weight as of this encounter: 76.2 kg.  DVT prophylaxis: scd Code Status: full Family Communication:none Disposition Plan:  Status is: Inpatient  Dispo:  Patient From: Home  Planned Disposition: To be determined  Expected discharge date: 09/04/19  Medically stable for discharge: No   Consultants: Infectious disease, plastic surgery  Procedures: Status post debridement of the right and left 09/02/2019 Antimicrobials: Vancomycin and cefepime  Subjective:  Complaining of 10 out of 10 pain in the right upper extremity after debridement.  Staff is concerned about his degree of pain Had rough night because of pain Objective: Vitals:   09/02/19 0523 09/02/19 0541 09/02/19 0818 09/02/19 1227  BP: 125/85  128/85 114/76  Pulse: 90  91 87  Resp: 18  18  Temp: 98.2 F (36.8 C)   98.5 F (36.9 C)  TempSrc: Oral   Oral  SpO2: 98%   100%  Weight:  76.2 kg    Height:        Intake/Output Summary (Last 24 hours) at 09/02/2019 1510 Last data filed at 09/02/2019 1400 Gross per 24 hour  Intake 1600.04 ml  Output 801 ml  Net 799.04 ml   Filed Weights   08/31/19 0028 09/01/19 0408 09/02/19  0541  Weight: 74.3 kg 74.1 kg 76.2 kg    Examination:  General exam: Appears calm and comfortable  Respiratory system: Clear to auscultation. Respiratory effort normal. Cardiovascular system: S1 & S2 heard, RRR. No JVD, systolic murmurs, rubs, gallops or clicks. No pedal edema. Gastrointestinal system: Abdomen is nondistended, soft and nontender. No organomegaly or masses felt. Normal bowel sounds heard. Central nervous system: Alert and oriented. No focal neurological deficits. Extremities: Right and left upper extremity covered with Ace wrap Skin: No rashes, lesions or ulcers Psychiatry: Judgement and insight appear normal. Mood & affect appropriate.     Data Reviewed: I have personally reviewed following labs and imaging studies  CBC: Recent Labs  Lab 08/27/19 1415 08/28/19 0509 08/29/19 0625 08/30/19 0500 08/31/19 1249 09/01/19 0451 09/02/19 0417  WBC 10.4   < > 14.2* 15.0* 16.6* 16.2* 16.2*  NEUTROABS 7.3  --   --   --   --   --   --   HGB 8.5*   < > 8.7* 9.2* 9.1* 9.2* 9.1*  HCT 27.9*   < > 27.1* 28.5* 28.2* 29.0* 28.7*  MCV 84.3   < > 79.5* 80.1 80.3 81.0 82.2  PLT 505*   < > 530* 558* 519* 547* 505*   < > = values in this interval not displayed.   Basic Metabolic Panel: Recent Labs  Lab 08/29/19 0625 08/30/19 0500 08/31/19 1249 09/01/19 0451 09/02/19 0417  NA 134* 135 137 139 140  K 3.7 3.5 3.9 3.3* 3.8  CL 104 104 109 110 112*  CO2 21* 22 20* 20* 21*  GLUCOSE 224* 196* 127* 164* 71  BUN 15 14 15 13 16   CREATININE 1.40* 1.35* 1.45* 1.34* 1.46*  CALCIUM 8.7* 8.7* 8.6* 8.5* 8.6*   GFR: Estimated Creatinine Clearance: 67.9 mL/min (A) (by C-G formula based on SCr of 1.46 mg/dL (H)). Liver Function Tests: Recent Labs  Lab 08/27/19 1415 08/29/19 0625  AST 16 13*  ALT 14 12  ALKPHOS 103 96  BILITOT 0.7 0.4  PROT 6.3* 6.1*  ALBUMIN 2.6* 2.6*   No results for input(s): LIPASE, AMYLASE in the last 168 hours. No results for input(s): AMMONIA in the  last 168 hours. Coagulation Profile: No results for input(s): INR, PROTIME in the last 168 hours. Cardiac Enzymes: No results for input(s): CKTOTAL, CKMB, CKMBINDEX, TROPONINI in the last 168 hours. BNP (last 3 results) No results for input(s): PROBNP in the last 8760 hours. HbA1C: No results for input(s): HGBA1C in the last 72 hours. CBG: Recent Labs  Lab 09/01/19 1540 09/01/19 2126 09/01/19 2219 09/02/19 0632 09/02/19 1225  GLUCAP 99 444* 259* 43* 110*   Lipid Profile: No results for input(s): CHOL, HDL, LDLCALC, TRIG, CHOLHDL, LDLDIRECT in the last 72 hours. Thyroid Function Tests: No results for input(s): TSH, T4TOTAL, FREET4, T3FREE, THYROIDAB in the last 72 hours. Anemia Panel: No results for input(s): VITAMINB12, FOLATE, FERRITIN, TIBC, IRON, RETICCTPCT in the last 72 hours. Sepsis Labs: No results for input(s): PROCALCITON, LATICACIDVEN in  the last 168 hours.  Recent Results (from the past 240 hour(s))  SARS Coronavirus 2 by RT PCR (hospital order, performed in [facility] hospital lab) Nasopharyngeal Nasopharyngeal Swab     Status: None   Collection Time: 08/27/19  5:42 PM   Specimen: Nasopharyngeal Swab  Result Value Ref Range Status   SARS Coronavirus 2 NEGATIVE NEGATIVE Final    Comment: (NOTE) SARS-CoV-2 target nucleic acids are NOT DETECTED.  The SARS-CoV-2 RNA is generally detectable in upper and lower respiratory specimens during the acute phase of infection. The lowest concentration of SARS-CoV-2 viral copies this assay can detect is 250 copies / mL. A negative result does not preclude SARS-CoV-2 infection and should not be used as the sole basis for treatment or other patient management decisions.  A negative result may occur with improper specimen collection / handling, submission of specimen other than nasopharyngeal swab, presence of viral mutation(s) within the areas targeted by this assay, and inadequate number of viral copies (<250 copies / mL). A  negative result must be combined with clinical observations, patient history, and epidemiological information.  Fact Sheet for Patients:   StrictlyIdeas.no  Fact Sheet for Healthcare Providers: BankingDealers.co.za  This test is not yet approved or  cleared by the [place] FDA and has been authorized for detection and/or diagnosis of SARS-CoV-2 by FDA under an Emergency Use Authorization (EUA).  This EUA will remain in effect (meaning this test can be used) for the duration of the COVID-19 declaration under Section 564(b)(1) of the Act, 21 U.S.C. section 360bbb-3(b)(1), unless the authorization is terminated or revoked sooner.  Performed at [facility], [place] [address], [place], [place] [address]   Blood Cultures x 2 sites     Status: None   Collection Time: 08/27/19  9:36 PM   Specimen: BLOOD RIGHT WRIST  Result Value Ref Range Status   Specimen Description BLOOD RIGHT WRIST  Final   Special Requests   Final    BOTTLES DRAWN AEROBIC AND ANAEROBIC Blood Culture adequate volume   Culture   Final    NO GROWTH 5 DAYS Performed at [facility], [place] [address], [place], [place] [address]    Report Status 09/01/2019 FINAL  Final  Blood Cultures x 2 sites     Status: None   Collection Time: 08/27/19  9:46 PM   Specimen: BLOOD LEFT WRIST  Result Value Ref Range Status   Specimen Description BLOOD LEFT WRIST  Final   Special Requests   Final    BOTTLES DRAWN AEROBIC AND ANAEROBIC Blood Culture adequate volume   Culture   Final    NO GROWTH 5 DAYS Performed at [facility], [place] [address], [place], [place] [address]    Report Status 09/01/2019 FINAL  Final  Surgical pcr screen     Status: Abnormal   Collection Time: 08/31/19 12:19 PM   Specimen: Nasal Mucosa; Nasal Swab  Result Value Ref Range Status   MRSA, PCR POSITIVE (A) NEGATIVE Final    Comment: RESULT CALLED TO, READ BACK BY AND VERIFIED WITH: Octavio Manns RN 14:30 08/31/19 ([email or web address])    Staphylococcus aureus POSITIVE (A) NEGATIVE Final    Comment: (NOTE) The Xpert SA Assay (FDA approved for NASAL specimens in patients 9 years of age and older), is one component of a comprehensive surveillance program. It is not intended to diagnose infection nor to guide or monitor treatment. Performed at [facility], [place] [address],  [place], [place] [address]          Radiology Studies: No results found.      Scheduled Meds: . amLODipine  5 mg Oral Daily  . atorvastatin  20 mg Oral Daily  . carvedilol  12.5 mg Oral BID WC  . Chlorhexidine Gluconate Cloth  6 each Topical Q0600  . cloNIDine  0.1 mg Oral QAC breakfast  . docusate sodium  100 mg Oral BID  . DULoxetine  30 mg Oral Daily  . enoxaparin (LOVENOX) injection  40 mg Subcutaneous Q24H  . gabapentin  400 mg Oral TID  . insulin aspart  0-15 Units Subcutaneous TID WC  . insulin aspart  0-5 Units Subcutaneous QHS  . insulin glargine  40 Units Subcutaneous QHS  . levothyroxine  50 mcg Oral Q0600  . morphine  30 mg Oral Q12H  . multivitamin with minerals  1 tablet Oral Daily  . mupirocin ointment  1 application Nasal BID  . nicotine  14 mg Transdermal Daily  . nutrition supplement (JUVEN)  1 packet Oral BID BM  . pantoprazole  40 mg Oral Daily  . Ensure Max Protein  11 oz Oral QHS  . sertraline  50 mg Oral Daily  . sodium chloride flush  10-40 mL Intracatheter Q12H  . sodium chloride flush  3 mL Intravenous Q12H  . traZODone  50 mg Oral QHS   Continuous Infusions: . sodium chloride 250 mL (09/02/19 0618)  . ceFEPime (MAXIPIME) IV 2 g (09/02/19 0620)  . lactated ringers    . vancomycin 750 mg (09/02/19 0715)     LOS: 6 days     Georgette Shell, MD 09/02/2019, 3:10 PM

## 2019-09-03 LAB — GLUCOSE, CAPILLARY
Glucose-Capillary: 64 mg/dL — ABNORMAL LOW (ref 70–99)
Glucose-Capillary: 75 mg/dL (ref 70–99)
Glucose-Capillary: 103 mg/dL — ABNORMAL HIGH (ref 70–99)
Glucose-Capillary: 66 mg/dL — ABNORMAL LOW (ref 70–99)
Glucose-Capillary: 93 mg/dL (ref 70–99)
Glucose-Capillary: 209 mg/dL — ABNORMAL HIGH (ref 70–99)
Glucose-Capillary: 163 mg/dL — ABNORMAL HIGH (ref 70–99)

## 2019-09-03 LAB — BASIC METABOLIC PANEL
Anion gap: 7 (ref 5–15)
BUN: 23 mg/dL — ABNORMAL HIGH (ref 6–20)
CO2: 22 mmol/L (ref 22–32)
Calcium: 8.6 mg/dL — ABNORMAL LOW (ref 8.9–10.3)
Chloride: 110 mmol/L (ref 98–111)
Creatinine, Ser: 1.3 mg/dL — ABNORMAL HIGH (ref 0.61–1.24)
GFR calc Af Amer: >60 mL/min (ref >60)
GFR calc non Af Amer: >60 mL/min (ref >60)
Glucose, Bld: 71 mg/dL (ref 70–99)
Potassium: 3.7 mmol/L (ref 3.5–5.1)
Sodium: 139 mmol/L (ref 135–145)

## 2019-09-03 LAB — CBC
HCT: 27.5 % — ABNORMAL LOW (ref 39.0–52.0)
Hemoglobin: 8.7 g/dL — ABNORMAL LOW (ref 13.0–17.0)
MCH: 26.4 pg (ref 26.0–34.0)
MCHC: 31.6 g/dL (ref 30.0–36.0)
MCV: 83.6 fL (ref 80.0–100.0)
Platelets: 458 10*3/uL — ABNORMAL HIGH (ref 150–400)
RBC: 3.29 MIL/uL — ABNORMAL LOW (ref 4.22–5.81)
RDW: 14.9 % (ref 11.5–15.5)
WBC: 14.3 10*3/uL — ABNORMAL HIGH (ref 4.0–10.5)
nRBC: 0 % (ref 0.0–0.2)

## 2019-09-03 LAB — VANCOMYCIN, TROUGH: Vancomycin Tr: 23 ug/mL (ref 15–20)

## 2019-09-03 MED ORDER — INSULIN ASPART 100 UNIT/ML ~~LOC~~ SOLN
0.0000 [IU] | Freq: Three times a day (TID) | SUBCUTANEOUS | Status: DC
  Administered 2019-09-04: 2 [IU] via SUBCUTANEOUS
  Administered 2019-09-04: 8 [IU] via SUBCUTANEOUS
  Administered 2019-09-05: 2 [IU] via SUBCUTANEOUS
  Administered 2019-09-05: 5 [IU] via SUBCUTANEOUS
  Administered 2019-09-05: 15 [IU] via SUBCUTANEOUS
  Administered 2019-09-06: 11 [IU] via SUBCUTANEOUS
  Administered 2019-09-06 (×2): 5 [IU] via SUBCUTANEOUS
  Administered 2019-09-07 (×2): 8 [IU] via SUBCUTANEOUS
  Administered 2019-09-07 – 2019-09-08 (×2): 3 [IU] via SUBCUTANEOUS
  Administered 2019-09-08 (×2): 8 [IU] via SUBCUTANEOUS
  Administered 2019-09-09: 3 [IU] via SUBCUTANEOUS
  Administered 2019-09-09: 5 [IU] via SUBCUTANEOUS
  Administered 2019-09-09: 2 [IU] via SUBCUTANEOUS
  Administered 2019-09-10: 3 [IU] via SUBCUTANEOUS
  Administered 2019-09-10: 11 [IU] via SUBCUTANEOUS
  Administered 2019-09-10: 3 [IU] via SUBCUTANEOUS

## 2019-09-03 MED ORDER — INSULIN GLARGINE 100 UNIT/ML ~~LOC~~ SOLN
20.0000 [IU] | Freq: Every day | SUBCUTANEOUS | Status: DC
  Administered 2019-09-03 – 2019-09-04 (×2): 20 [IU] via SUBCUTANEOUS
  Filled 2019-09-03 (×3): qty 0.2

## 2019-09-03 MED ORDER — VANCOMYCIN HCL 1250 MG/250ML IV SOLN
1250.0000 mg | Freq: Every day | INTRAVENOUS | Status: DC
  Administered 2019-09-03 – 2019-09-05 (×3): 1250 mg via INTRAVENOUS
  Filled 2019-09-03 (×4): qty 250

## 2019-09-03 MED ORDER — VANCOMYCIN HCL 1250 MG/250ML IV SOLN: 1250.0000 mg | Freq: Every day | INTRAVENOUS | Status: DC

## 2019-09-03 NOTE — Progress Notes (Signed)
Hypoglycemic Event  CBG: 66  Treatment: 2 cup of juices, crackers, and lunch tray  Symptoms: None  Follow-up CBG: Time: 1210 CBG Result: 93  Possible Reasons for Event: Not sure      Luke [name]

## 2019-09-03 NOTE — Progress Notes (Signed)
At 0517: Glucose is 64 and patient denies any symptoms. - Patient received Lantus 40 units last night. 8 oz juice provided.   - Vanco trough noted 23. Pharmacy called and adjusted vancomycin.   At 0607; Glucose is 75. Patient had another 8 oz of juice.   At 0640: Glucose is 103 at this time. Will continue to assess.

## 2019-09-03 NOTE — Progress Notes (Signed)
Pharmacy Antibiotic Note  Luke [name] is a 39 y.o. male admitted on 08/27/2019 with MV and PV endocarditis for Vancomycin.  Vancomycin trough this morning is supratherapeutic at 23.    Plan: Decrease vancomycin 1250 mg IV q24h    Height: 5\' 9"  (175.3 cm) Weight: 53.8 kg (118 lb 11.2 oz) (corrected) IBW/kg (Calculated) : 70.7  Temp (24hrs), Avg:98.4 F (36.9 C), Min:98.1 F (36.7 C), Max:98.5 F (36.9 C)  Recent Labs  Lab 08/30/19 0500 08/30/19 0600 08/31/19 1249 09/01/19 0451 09/02/19 0417 09/03/19 0500 09/03/19 0521  WBC 15.0*  --  16.6* 16.2* 16.2* 14.3*  --   CREATININE 1.35*  --  1.45* 1.34* 1.46* 1.30*  --   VANCOTROUGH  --  21*  --   --   --   --  23*    Estimated Creatinine Clearance: 58.1 mL/min (A) (by C-G formula based on SCr of 1.3 mg/dL (H)).    No Known Allergies  Antimicrobials this admission: cefepime 6/10 >>  vanco 6/10 >>   Dose adjustments this admission:  6/13 Vanc 1g > 750mg  q12 hr   Microbiology results: 6/10 BCx: ngtd 3/5 MRSA neg  2/19 Bcx strep mitis/oralis   Phillis Knack, PharmD, BCPS

## 2019-09-03 NOTE — Progress Notes (Addendum)
[place] for Infectious Disease  Date of Admission:  08/27/2019      Total days of antibiotics 8  Vancomycin + Cefepime     ASSESSMENT: Luke [name] is a 39 y.o. male with recent injection drug use resulting in chronic necrotic wounds to bilateral forearms and vegetations noted on both native mitral and pulmonic valves, culture negative from blood. Suspect fastidious organism - will continue vancomycin and cefepime for now pending re-evaluation of debrided arms. If these look clean and healing well we may be able to change cefepime to ceftriaxone. Will reassess after 2 weeks to determine if he would be a candidate for long acting injectable treatment + oral levaquin with close outpatient management. Loud systolic murmur - would repeat Echo again after he completes treatment. He denies any dental pain currently.   I think with regards to Suboxone for opioid replacement, he has never been properly attempted on this to help manage withdrawals; he would be open to using this to help bridge him to drug rehab program - would recommend LCSW / CM to discuss outpatient resources for him if not already done.   Diabetes has been labile in the setting of frequent infections. I explained to him today that if we can treat his infections and correct the problem with drug use to prevent future infections this will make his diabetes much easier to control.   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 His wife would benefit from screening, presuming they are sexually active.    PLAN: 1. Continue cefepime + vancomycin  2. Appreciate any assistance from CM/LCSW for drug rehab coordination  3. Follow surgery for wound status  4. Hep C tx outpatient      Principal Problem:   Endocarditis of mitral valve Active Problems:   Drug abuse, IV ([name])   Tobacco abuse   CKD (chronic kidney disease) stage 3, GFR 30-59 ml/min   Uncontrolled insulin dependent  type 1 diabetes mellitus ([name])   Diabetic ulcer of left foot ([name])   Hypothyroidism   Heroin abuse ([place])   Congestive heart failure (CHF) ([name])   Arm wound   . amLODipine  5 mg Oral Daily  . atorvastatin  20 mg Oral Daily  . carvedilol  12.5 mg Oral BID WC  . Chlorhexidine Gluconate Cloth  6 each Topical Q0600  . docusate sodium  100 mg Oral BID  . DULoxetine  30 mg Oral Daily  . enoxaparin (LOVENOX) injection  40 mg Subcutaneous Q24H  . gabapentin  400 mg Oral TID  . insulin aspart  0-15 Units Subcutaneous TID WC  . insulin aspart  0-5 Units Subcutaneous QHS  . insulin glargine  40 Units Subcutaneous QHS  . levothyroxine  50 mcg Oral Q0600  . morphine  30 mg Oral Q12H  . multivitamin with minerals  1 tablet Oral Daily  . mupirocin ointment  1 application Nasal BID  . nicotine  14 mg Transdermal Daily  . nutrition supplement (JUVEN)  1 packet Oral BID BM  . pantoprazole  40 mg Oral Daily  . Ensure Max Protein  11 oz Oral QHS  . sertraline  50 mg Oral Daily  . sodium chloride flush  10-40 mL Intracatheter Q12H  . sodium chloride flush  3 mL Intravenous Q12H  . traZODone  50 mg Oral QHS    SUBJECTIVE: Doing well - only concern is the pain in his  arms. States his pain medications are going to be titrated down now and he is OK with this plan. Would consider Suboxone with outpatient FU for opioid dependence. His wife is in the room today and encourages him to work on the plan for drug rehab.    Review of Systems: Review of Systems  Constitutional: Negative for chills, diaphoresis and fever.  HENT: Negative for tinnitus.   Eyes: Negative for blurred vision and photophobia.  Respiratory: Positive for shortness of breath. Negative for cough and sputum production.   Cardiovascular: Negative for chest pain and leg swelling.  Gastrointestinal: Negative for diarrhea, nausea and vomiting.  Genitourinary: Negative for dysuria.  Musculoskeletal: Negative for joint pain and myalgias.    Skin: Negative for rash.       Chronic wounds to b/l forearms   Neurological: Negative for weakness and headaches.    No Known Allergies  OBJECTIVE: Vitals:   09/02/19 1945 09/03/19 0415 09/03/19 0420 09/03/19 0822  BP: 121/79 97/63  128/75  Pulse: 88 88  87  Resp: 15 15    Temp: 98.5 F (36.9 C) 98.1 F (36.7 C)  98.3 F (36.8 C)  TempSrc: Oral Oral  Oral  SpO2: 99% 94%  100%  Weight:   53.8 kg   Height:       Body mass index is 17.53 kg/m.  Physical Exam Vitals reviewed.  Constitutional:      Appearance: Normal appearance. He is not ill-appearing.     Comments: Sitting on the side of the bed. Appears comfortable.   HENT:     Mouth/Throat:     Mouth: Mucous membranes are moist.     Pharynx: Oropharynx is clear.  Eyes:     General: No scleral icterus. Cardiovascular:     Rate and Rhythm: Normal rate and regular rhythm.     Pulses: Normal pulses.     Heart sounds: Murmur (3/6 systolic murmur ) heard.   Pulmonary:     Effort: Pulmonary effort is normal.     Breath sounds: Normal breath sounds.  Chest:     Chest wall: No tenderness.  Abdominal:     General: Bowel sounds are normal. There is no distension.     Palpations: Abdomen is soft. There is no fluid wave, hepatomegaly or splenomegaly.     Tenderness: There is no abdominal tenderness.  Musculoskeletal:        General: No swelling.     Right lower leg: No edema.     Left lower leg: No edema.     Comments: B/L Forearms wrapped in clean and dry ace wraps  Skin:    Capillary Refill: Capillary refill takes less than 2 seconds.     Coloration: Skin is not jaundiced.     Findings: No rash.  Neurological:     Mental Status: He is alert and oriented to person, place, and time.  Psychiatric:        Behavior: Behavior normal.        Judgment: Judgment normal.     Lab Results Lab Results  Component Value Date   WBC 14.3 (H) 09/03/2019   HGB 8.7 (L) 09/03/2019   HCT 27.5 (L) 09/03/2019   MCV 83.6  09/03/2019   PLT 458 (H) 09/03/2019    Lab Results  Component Value Date   CREATININE 1.30 (H) 09/03/2019   BUN 23 (H) 09/03/2019   NA 139 09/03/2019   K 3.7 09/03/2019   CL 110 09/03/2019   CO2 22  09/03/2019    Lab Results  Component Value Date   ALT 12 08/29/2019   AST 13 (L) 08/29/2019   ALKPHOS 96 08/29/2019   BILITOT 0.4 08/29/2019     Microbiology: Recent Results (from the past 240 hour(s))  SARS Coronavirus 2 by RT PCR (hospital order, performed in [facility] hospital lab) Nasopharyngeal Nasopharyngeal Swab     Status: None   Collection Time: 08/27/19  5:42 PM   Specimen: Nasopharyngeal Swab  Result Value Ref Range Status   SARS Coronavirus 2 NEGATIVE NEGATIVE Final    Comment: (NOTE) SARS-CoV-2 target nucleic acids are NOT DETECTED.  The SARS-CoV-2 RNA is generally detectable in upper and lower respiratory specimens during the acute phase of infection. The lowest concentration of SARS-CoV-2 viral copies this assay can detect is 250 copies / mL. A negative result does not preclude SARS-CoV-2 infection and should not be used as the sole basis for treatment or other patient management decisions.  A negative result may occur with improper specimen collection / handling, submission of specimen other than nasopharyngeal swab, presence of viral mutation(s) within the areas targeted by this assay, and inadequate number of viral copies (<250 copies / mL). A negative result must be combined with clinical observations, patient history, and epidemiological information.  Fact Sheet for Patients:   StrictlyIdeas.no  Fact Sheet for Healthcare Providers: BankingDealers.co.za  This test is not yet approved or  cleared by the [place] FDA and has been authorized for detection and/or diagnosis of SARS-CoV-2 by FDA under an Emergency Use Authorization (EUA).  This EUA will remain in effect (meaning this test can be used) for  the duration of the COVID-19 declaration under Section 564(b)(1) of the Act, 21 U.S.C. section 360bbb-3(b)(1), unless the authorization is terminated or revoked sooner.  Performed at [facility], [place] [address], [place], [place] [address]   Blood Cultures x 2 sites     Status: None   Collection Time: 08/27/19  9:36 PM   Specimen: BLOOD RIGHT WRIST  Result Value Ref Range Status   Specimen Description BLOOD RIGHT WRIST  Final   Special Requests   Final    BOTTLES DRAWN AEROBIC AND ANAEROBIC Blood Culture adequate volume   Culture   Final    NO GROWTH 5 DAYS Performed at [facility], [place] [address] [place], [place] [address]    Report Status 09/01/2019 FINAL  Final  Blood Cultures x 2 sites     Status: None   Collection Time: 08/27/19  9:46 PM   Specimen: BLOOD LEFT WRIST  Result Value Ref Range Status   Specimen Description BLOOD LEFT WRIST  Final   Special Requests   Final    BOTTLES DRAWN AEROBIC AND ANAEROBIC Blood Culture adequate volume   Culture   Final    NO GROWTH 5 DAYS Performed at [facility], [place] [address] [place], [place] [address]    Report Status 09/01/2019 FINAL  Final  Surgical pcr screen     Status: Abnormal   Collection Time: 08/31/19 12:19 PM   Specimen: Nasal Mucosa; Nasal Swab  Result Value Ref Range Status   MRSA, PCR POSITIVE (A) NEGATIVE Final    Comment: RESULT CALLED TO, READ BACK BY AND VERIFIED WITH: Octavio Manns RN 14:30 08/31/19 ([email or web address])    Staphylococcus aureus POSITIVE (A) NEGATIVE Final    Comment: (NOTE) The Xpert SA Assay (FDA approved for NASAL specimens in patients 23 years of age and older), is  one component of a comprehensive surveillance program. It is not intended to diagnose infection nor to guide or monitor treatment. Performed at [facility], [place] [address] [place], [place] [address]      [name], MSN, NP-C [place] for Infectious Disease [email or web address] Pager: [phone] Office: [phone] [place]: [phone]

## 2019-09-03 NOTE — Progress Notes (Signed)
Nutrition Follow-up  DOCUMENTATION CODES:   Not applicable  INTERVENTION:   -Continue Ensure Max po daily, each supplement provides 150 kcal and 30 grams of protein -Continue -1 packet Juven BID, each packet provides 95 calories, 2.5 grams of protein (collagen), and 9.8 grams of carbohydrate (3 grams sugar); also contains 7 grams of L-arginine and L-glutamine, 300 mg vitamin C, 15 mg vitamin E, 1.2 mcg vitamin B-12, 9.5 mg zinc, 200 mg calcium, and 1.5 g  Calcium Beta-hydroxy-Beta-methylbutyrate to support wound healing -Continue MVI with minerals daily -Continue Magic cup TID with meals, each supplement provides 290 kcal and 9 grams of protein  NUTRITION DIAGNOSIS:   Increased nutrient needs related to wound healing as evidenced by estimated needs.  Ongoing  GOAL:   Patient will meet greater than or equal to 90% of their needs  Progressing   MONITOR:   PO intake, Supplement acceptance, Labs, Weight trends, Skin, I & O's  REASON FOR ASSESSMENT:   Consult Wound healing  ASSESSMENT:   Luke [name] is a 39 y.o. male with medical history significant of type 1 DM; IVDA/polysubstance abuse; seizures; PVD s/p L great toe amputation; hypothyroidism; HTN; endocarditis; and stage 3 CKD presenting with SOB.  6/15- PROCEDURE:  1. Debridement right forearm wound 9x6 cm including skin, subcutaneous tissue, and fascia with placement of primatrix AG mesh 2. Debridement of left forearm wound 6.5x4 cm including skin, subcutaneous tissue, and fascia with placement of primatrix AG mesh  Reviewed I/O's: -71 ml x 24 hours and +1.5 L since admission  UOP: 1 L x 24 hours  Pt sleeping soundly at time of visit. He did not respond to voice or touch.   Pt's intake has improved since earlier this week; noted meal completion 50-100%. Pt with variable acceptance of Juven and Ensure Max supplements.   Per nursing notes, pt experienced a hypoglycemic episode earlier this AM; DM coordinator  recommending reduction in insulin glargine.   Per ID notes, plan to remain in the hospital for completion of IV antibiotics.   Labs reviewed: CBGS: 64-103 (inpatient orders for glycemic control are 0-15 units insulin aspart TID with meals, 0-5 units insulin aspart q HS, and 40 units insulin glargine daily at bedtime).   NUTRITION - FOCUSED PHYSICAL EXAM:    Most Recent Value  Orbital Region No depletion  Upper Arm Region No depletion  Thoracic and Lumbar Region No depletion  Buccal Region No depletion  Temple Region No depletion  Clavicle Bone Region No depletion  Clavicle and Acromion Bone Region No depletion  Scapular Bone Region No depletion  Dorsal Hand No depletion  Patellar Region No depletion  Anterior Thigh Region No depletion  Posterior Calf Region No depletion  Edema (RD Assessment) None  Hair Reviewed  Eyes Reviewed  Mouth Reviewed  Skin Reviewed  Nails Reviewed       Diet Order:   Diet Order            Diet Carb Modified Fluid consistency: Thin; Room service appropriate? Yes  Diet effective now                 EDUCATION NEEDS:   No education needs have been identified at this time  Skin:  Skin Assessment: Skin Integrity Issues: Skin Integrity Issues:: Other (Comment) Other: lt second toe full thickness wound with possible exposed joint; highly necrotic, full thickness BUE wounds related to IV drug abuse  Last BM:  09/02/19  Height:   Ht Readings from Last 1  Encounters:  08/27/19 5\' 9"  (1.753 m)    Weight:   Wt Readings from Last 1 Encounters:  09/03/19 53.8 kg    Ideal Body Weight:  72.7 kg  BMI:  Body mass index is 17.53 kg/m.  Estimated Nutritional Needs:   Kcal:  7218-2883  Protein:  130-145 grams  Fluid:  > 2.2 L    Loistine Chance, RD, LDN, [place] Registered Dietitian II Certified Diabetes Care and Education Specialist Please refer to [facility] for RD and/or RD on-call/weekend/after hours pager

## 2019-09-03 NOTE — Progress Notes (Signed)
2 Days Post-Op  Subjective: Patient is a 39 yr-old male who underwent bilateral forearm debridements with placement of Primatrix AG 09/01/19 with Dr. Claudia Desanctis. PMH significant for IVDA/polysubstance abuse, insulin dependant diabetes, hypothyroidism, HTN, endocarditis, Stage III CKD, hx great toe amputation.   Today he is sitting in a chair, wife by the bedside. Reports overall he's feeling well. Denies F, N/V. Reports CP and SOB have been improving since admission. Reports pain of his bilateral arm wounds, no other complaints.  Dressing has been changed today by RN, look good. Mepitel mesh in place. No signs of infection or redness.  Mild expected drainage on dressing. Bilateral radial pulses present. Full ROM of bilateral arms, sensation intact.         Objective: Vital signs in last 24 hours: Temp:  [98.1 F (36.7 C)-98.5 F (36.9 C)] 98.3 F (36.8 C) (06/17 0822) Pulse Rate:  [87-88] 87 (06/17 0822) Resp:  [15] 15 (06/17 0415) BP: (97-128)/(63-79) 128/75 (06/17 0822) SpO2:  [94 %-100 %] 100 % (06/17 0822) Weight:  [53.8 kg-76.7 kg] 76.7 kg (06/17 1200) Last BM Date: 09/02/19  Intake/Output from previous day: 06/16 0701 - 06/17 0700 In: 930 [P.O.:720; I.V.:10; IV Piggyback:200] Out: 1001 [Urine:1000; Stool:1] Intake/Output this shift: Total I/O In: 120 [P.O.:120] Out: 400 [Urine:400]  General appearance: alert, cooperative and no distress Head: Normocephalic, without obvious abnormality, atraumatic Eyes: EOMs intact Neck: normal ROM Resp: nonlabored Extremities: bilateral forearm wounds: Mepitel & Primatrix AG mesh in place. No signs of infection, redness. Mild drainage on bandage as expected. Radial pulses intact. Full ROM of litaeral lower limbs and sensation intact,  Lab Results:  CBC    Component Value Date/Time   WBC 14.3 (H) 09/03/2019 0500   RBC 3.29 (L) 09/03/2019 0500   HGB 8.7 (L) 09/03/2019 0500   HGB 12.0 (L) 04/19/2014 0203   HCT 27.5 (L) 09/03/2019 0500   HCT  37.9 (L) 04/19/2014 0203   PLT 458 (H) 09/03/2019 0500   PLT 383 04/19/2014 0203   MCV 83.6 09/03/2019 0500   MCV 87 04/19/2014 0203   MCH 26.4 09/03/2019 0500   MCHC 31.6 09/03/2019 0500   RDW 14.9 09/03/2019 0500   RDW 14.3 04/19/2014 0203   LYMPHSABS 2.1 08/27/2019 1415   LYMPHSABS 5.2 (H) 04/08/2014 0959   MONOABS 0.7 08/27/2019 1415   MONOABS 1.3 (H) 04/08/2014 0959   EOSABS 0.2 08/27/2019 1415   EOSABS 0.4 04/08/2014 0959   BASOSABS 0.1 08/27/2019 1415   BASOSABS 0.2 (H) 04/08/2014 0959   BMET Recent Labs    09/02/19 0417 09/03/19 0500  NA 140 139  K 3.8 3.7  CL 112* 110  CO2 21* 22  GLUCOSE 71 71  BUN 16 23*  CREATININE 1.46* 1.30*  CALCIUM 8.6* 8.6*   PT/INR No results for input(s): LABPROT, INR in the last 72 hours. ABG No results for input(s): PHART, HCO3 in the last 72 hours.  Invalid input(s): PCO2, PO2  Studies/Results: No results found.  Anti-infectives: Anti-infectives (From admission, onward)   Start     Dose/Rate Route Frequency Ordered Stop   09/04/19 1000  vancomycin (VANCOREADY) IVPB 1250 mg/250 mL  Status:  Discontinued        1,250 mg 166.7 mL/hr over 90 Minutes Intravenous Daily 09/03/19 0614 09/03/19 1225   09/03/19 1800  vancomycin (VANCOREADY) IVPB 1250 mg/250 mL     Discontinue     1,250 mg 166.7 mL/hr over 90 Minutes Intravenous Daily 09/03/19 1225     08/30/19  1800  vancomycin (VANCOREADY) IVPB 750 mg/150 mL  Status:  Discontinued        750 mg 150 mL/hr over 60 Minutes Intravenous Every 12 hours 08/30/19 0731 09/03/19 0614   08/28/19 0630  vancomycin (VANCOCIN) IVPB 1000 mg/200 mL premix  Status:  Discontinued        1,000 mg 200 mL/hr over 60 Minutes Intravenous Every 12 hours 08/27/19 1742 08/30/19 0731   08/27/19 1800  vancomycin (VANCOREADY) IVPB 1500 mg/300 mL        1,500 mg 150 mL/hr over 120 Minutes Intravenous  Once 08/27/19 1742 08/27/19 2230   08/27/19 1800  ceFEPIme (MAXIPIME) 2 g in sodium chloride 0.9 % 100 mL  IVPB     Discontinue     2 g 200 mL/hr over 30 Minutes Intravenous Every 8 hours 08/27/19 1742        Assessment/Plan: s/p Procedure(s): IRRIGATION AND DEBRIDEMENT FOREARM WOUNDS APPLICATION OF MESHED [place] AG OF EXTREMITY Patient is doing well after debridement of bilateral arm wounds with placement of Primatrix & Mepitel mesh. No signs of infection. Radial pulses and sensation intact. Full ROM.   Continue daily dressing changes of Surgical Lube applied to Mepitel mesh (white mesh), cover with gauze, wrap with Kerlix and ACE wrap. Do not remove Mepitel mesh.   Stable for discharge from Plastics standpoint. Will follow in the hospital while he is here and manage as outpatient when discharged. Current plan to remove mepitel mesh in 2 weeks (can be done in the clinic or at bedside).  Continue to control blood sugar levels for optimal wound healing.     LOS: 7 days    Threasa Heads, PA-C 09/03/2019

## 2019-09-03 NOTE — Progress Notes (Addendum)
PROGRESS NOTE    Luke [name]  ZOX:[id number] DOB: 09/21/1980 DOA: 08/27/2019 PCP: Gildardo Pounds, NP    Brief Narrative: 39 year old male with type 1 diabetes IV drug abuse polysubstance abuse seizures endocarditis stage III CKD peripheral vascular disease left great toe amputation and hypothyroidism and hypertension admitted with shortness of breath.  He was found to have left arm cellulitis with a deep ulcer on 08/20/2019 in the emergency room.  Plastic surgery recommended surgical intervention patient left AMA on 08/21/2019.  His friend brought him back to the hospital since patient had substernal chest pain and could not breathe.  Assessment & Plan:   Principal Problem:   Endocarditis of mitral valve Active Problems:   Drug abuse, IV ([name])   Tobacco abuse   CKD (chronic kidney disease) stage 3, GFR 30-59 ml/min   Uncontrolled insulin dependent type 1 diabetes mellitus ([place])   Diabetic ulcer of left foot ([name])   Hypothyroidism   Heroin abuse ([place])   Congestive heart failure (CHF) ([name])   Arm wound   #1 mitral valve endocarditis -echo shows mitral valve vegetation with moderate regurgitation.  Possible vegetation also on the pulmonic valve.  On vancomycin and cefepime.  ID following.  Recommending 4 weeks of antibiotic therapy. Previous attending discussed with cardiology on 08/29/2019.  It was thought that MR was not severe enough to do any surgical intervention at this time.  He was seen by Dr. Kipp Brood in the past and was deemed not a good candidate for surgery.  #2 right forearm necrosis status post surgery 09/01/2019 status post debridement of the right forearm and debridement of the left forearm.  #3 type 1 diabetes uncontrolled with hyperglycemia CBG (last 3) Recent Labs (last 2 labs)        Recent Labs    09/01/19 2219 09/02/19 0632 09/02/19 1225  GLUCAP 259* 43* 110*     Will decrease Lantus to 20 units nightly.  #4 stage IIIa CKD stable monitor on vancomycin.   Creatinine 1.30  #5 left second toe ulcer continue wound care  #6 polysubstance abuse on clonidine taper.  Consulted TOC to provide resources  #7 normocytic anemia stable no evidence of active bleeding  #8 hepatitis C outpatient ID follow-up  #9 history of essential hypertension-blood pressure 128/75 on Norvasc and carvedilol.               #10 tobacco abuse continue nicotine patch  #11 hypothyroidism on levothyroxine  #12 hyperlipidemia on Lipitor.  #13 pain control I have started him on long-acting MS Contin and Dilaudid for breakthrough pain.  Continue.  Will titrate down slowly   Nutrition Problem: Increased nutrient needs Etiology: wound healing     Signs/Symptoms: estimated needs    Interventions: MVI, Juven, Premier Protein  Estimated body mass index is 24.96 kg/m as calculated from the following:   Height as of this encounter: 5\' 9"  (1.753 m).   Weight as of this encounter: 76.7 kg.  DVT prophylaxis: scd Code Status: full Family Communication:none Disposition Plan:  Status is: Inpatient  Dispo:             Patient From: Home             Planned Disposition: To be determined             Expected discharge date: 09/04/19             Medically stable for discharge: No   Consultants: Infectious disease, plastic surgery  Procedures: Status  post debridement of the right and left 09/02/2019 Antimicrobials: Vancomycin and cefepime    Subjective: Sitting up in chair reports that his pain is better with the new regimen Had a bowel movement. Noted blood sugar dropped earlier this morning 64/75/103  objective: Vitals:   09/03/19 0415 09/03/19 0420 09/03/19 0822 09/03/19 1200  BP: 97/63  128/75   Pulse: 88  87   Resp: 15     Temp: 98.1 F (36.7 C)  98.3 F (36.8 C)   TempSrc: Oral  Oral   SpO2: 94%  100%   Weight:  53.8 kg  76.7 kg  Height:        Intake/Output Summary (Last 24 hours) at 09/03/2019 1311 Last data filed at 09/03/2019  1000 Gross per 24 hour  Intake 810 ml  Output 1400 ml  Net -590 ml   Filed Weights   09/02/19 0541 09/03/19 0420 09/03/19 1200  Weight: 76.2 kg 53.8 kg 76.7 kg    Examination:  General exam: Appears calm and comfortable  Respiratory system: Clear to auscultation. Respiratory effort normal. Cardiovascular system: S1 & S2 heard, RRR. No JVD, systolic murmurs, rubs, gallops or clicks. No pedal edema. Gastrointestinal system: Abdomen is nondistended, soft and nontender. No organomegaly or masses felt. Normal bowel sounds heard. Central nervous system: Alert and oriented. No focal neurological deficits. Extremities: Ace bandage in the right and the left hand  Psychiatry: Judgement and insight appear normal. Mood & affect appropriate.     Data Reviewed: I have personally reviewed following labs and imaging studies  CBC: Recent Labs  Lab 08/27/19 1415 08/28/19 0509 08/30/19 0500 08/31/19 1249 09/01/19 0451 09/02/19 0417 09/03/19 0500  WBC 10.4   < > 15.0* 16.6* 16.2* 16.2* 14.3*  NEUTROABS 7.3  --   --   --   --   --   --   HGB 8.5*   < > 9.2* 9.1* 9.2* 9.1* 8.7*  HCT 27.9*   < > 28.5* 28.2* 29.0* 28.7* 27.5*  MCV 84.3   < > 80.1 80.3 81.0 82.2 83.6  PLT 505*   < > 558* 519* 547* 505* 458*   < > = values in this interval not displayed.   Basic Metabolic Panel: Recent Labs  Lab 08/30/19 0500 08/31/19 1249 09/01/19 0451 09/02/19 0417 09/03/19 0500  NA 135 137 139 140 139  K 3.5 3.9 3.3* 3.8 3.7  CL 104 109 110 112* 110  CO2 22 20* 20* 21* 22  GLUCOSE 196* 127* 164* 71 71  BUN 14 15 13 16  23*  CREATININE 1.35* 1.45* 1.34* 1.46* 1.30*  CALCIUM 8.7* 8.6* 8.5* 8.6* 8.6*   GFR: Estimated Creatinine Clearance: 76.3 mL/min (A) (by C-G formula based on SCr of 1.3 mg/dL (H)). Liver Function Tests: Recent Labs  Lab 08/27/19 1415 08/29/19 0625  AST 16 13*  ALT 14 12  ALKPHOS 103 96  BILITOT 0.7 0.4  PROT 6.3* 6.1*  ALBUMIN 2.6* 2.6*   No results for input(s):  LIPASE, AMYLASE in the last 168 hours. No results for input(s): AMMONIA in the last 168 hours. Coagulation Profile: No results for input(s): INR, PROTIME in the last 168 hours. Cardiac Enzymes: No results for input(s): CKTOTAL, CKMB, CKMBINDEX, TROPONINI in the last 168 hours. BNP (last 3 results) No results for input(s): PROBNP in the last 8760 hours. HbA1C: No results for input(s): HGBA1C in the last 72 hours. CBG: Recent Labs  Lab 09/02/19 2115 09/03/19 0517 09/03/19 6629 09/03/19 0645 09/03/19 1138  GLUCAP 197* 64* 75 103* 66*   Lipid Profile: No results for input(s): CHOL, HDL, LDLCALC, TRIG, CHOLHDL, LDLDIRECT in the last 72 hours. Thyroid Function Tests: No results for input(s): TSH, T4TOTAL, FREET4, T3FREE, THYROIDAB in the last 72 hours. Anemia Panel: No results for input(s): VITAMINB12, FOLATE, FERRITIN, TIBC, IRON, RETICCTPCT in the last 72 hours. Sepsis Labs: No results for input(s): PROCALCITON, LATICACIDVEN in the last 168 hours.  Recent Results (from the past 240 hour(s))  SARS Coronavirus 2 by RT PCR (hospital order, performed in [facility] hospital lab) Nasopharyngeal Nasopharyngeal Swab     Status: None   Collection Time: 08/27/19  5:42 PM   Specimen: Nasopharyngeal Swab  Result Value Ref Range Status   SARS Coronavirus 2 NEGATIVE NEGATIVE Final    Comment: (NOTE) SARS-CoV-2 target nucleic acids are NOT DETECTED.  The SARS-CoV-2 RNA is generally detectable in upper and lower respiratory specimens during the acute phase of infection. The lowest concentration of SARS-CoV-2 viral copies this assay can detect is 250 copies / mL. A negative result does not preclude SARS-CoV-2 infection and should not be used as the sole basis for treatment or other patient management decisions.  A negative result may occur with improper specimen collection / handling, submission of specimen other than nasopharyngeal swab, presence of viral mutation(s) within the areas  targeted by this assay, and inadequate number of viral copies (<250 copies / mL). A negative result must be combined with clinical observations, patient history, and epidemiological information.  Fact Sheet for Patients:   StrictlyIdeas.no  Fact Sheet for Healthcare Providers: BankingDealers.co.za  This test is not yet approved or  cleared by the [place] FDA and has been authorized for detection and/or diagnosis of SARS-CoV-2 by FDA under an Emergency Use Authorization (EUA).  This EUA will remain in effect (meaning this test can be used) for the duration of the COVID-19 declaration under Section 564(b)(1) of the Act, 21 U.S.C. section 360bbb-3(b)(1), unless the authorization is terminated or revoked sooner.  Performed at [facility], [place] [address] [place], [place] [address]   Blood Cultures x 2 sites     Status: None   Collection Time: 08/27/19  9:36 PM   Specimen: BLOOD RIGHT WRIST  Result Value Ref Range Status   Specimen Description BLOOD RIGHT WRIST  Final   Special Requests   Final    BOTTLES DRAWN AEROBIC AND ANAEROBIC Blood Culture adequate volume   Culture   Final    NO GROWTH 5 DAYS Performed at [facility], [place] [address], [place], [place] [address]    Report Status 09/01/2019 FINAL  Final  Blood Cultures x 2 sites     Status: None   Collection Time: 08/27/19  9:46 PM   Specimen: BLOOD LEFT WRIST  Result Value Ref Range Status   Specimen Description BLOOD LEFT WRIST  Final   Special Requests   Final    BOTTLES DRAWN AEROBIC AND ANAEROBIC Blood Culture adequate volume   Culture   Final    NO GROWTH 5 DAYS Performed at [facility], [place] [address], [place], [place] [address]    Report Status 09/01/2019 FINAL  Final  Surgical pcr screen     Status: Abnormal   Collection Time: 08/31/19 12:19 PM   Specimen: Nasal Mucosa; Nasal Swab  Result Value Ref Range Status   MRSA, PCR POSITIVE (A)  NEGATIVE Final    Comment: RESULT CALLED TO, READ BACK BY AND VERIFIED WITH:  Octavio Manns RN 14:30 08/31/19 ([email or web address])    Staphylococcus aureus POSITIVE (A) NEGATIVE Final    Comment: (NOTE) The Xpert SA Assay (FDA approved for NASAL specimens in patients 53 years of age and older), is one component of a comprehensive surveillance program. It is not intended to diagnose infection nor to guide or monitor treatment. Performed at [facility], [place] [address] [place], [place] [address]          Radiology Studies: No results found.      Scheduled Meds: . amLODipine  5 mg Oral Daily  . atorvastatin  20 mg Oral Daily  . carvedilol  12.5 mg Oral BID WC  . Chlorhexidine Gluconate Cloth  6 each Topical Q0600  . docusate sodium  100 mg Oral BID  . DULoxetine  30 mg Oral Daily  . enoxaparin (LOVENOX) injection  40 mg Subcutaneous Q24H  . gabapentin  400 mg Oral TID  . insulin aspart  0-15 Units Subcutaneous TID WC  . insulin aspart  0-5 Units Subcutaneous QHS  . insulin glargine  40 Units Subcutaneous QHS  . levothyroxine  50 mcg Oral Q0600  . morphine  30 mg Oral Q12H  . multivitamin with minerals  1 tablet Oral Daily  . mupirocin ointment  1 application Nasal BID  . nicotine  14 mg Transdermal Daily  . nutrition supplement (JUVEN)  1 packet Oral BID BM  . pantoprazole  40 mg Oral Daily  . Ensure Max Protein  11 oz Oral QHS  . sertraline  50 mg Oral Daily  . sodium chloride flush  10-40 mL Intracatheter Q12H  . sodium chloride flush  3 mL Intravenous Q12H  . traZODone  50 mg Oral QHS   Continuous Infusions: . sodium chloride 10 mL/hr at 09/02/19 2000  . ceFEPime (MAXIPIME) IV 2 g (09/03/19 0527)  . lactated ringers    . vancomycin       LOS: 7 days     Georgette Shell, MD  09/03/2019, 1:11 PM

## 2019-09-04 ENCOUNTER — Encounter (HOSPITAL_COMMUNITY): Payer: Self-pay | Admitting: Internal Medicine

## 2019-09-04 ENCOUNTER — Inpatient Hospital Stay (HOSPITAL_COMMUNITY): Payer: Medicaid Other

## 2019-09-04 DIAGNOSIS — I38 Endocarditis, valve unspecified: Secondary | ICD-10-CM | POA: Diagnosis present

## 2019-09-04 LAB — GLUCOSE, CAPILLARY
Glucose-Capillary: 140 mg/dL — ABNORMAL HIGH (ref 70–99)
Glucose-Capillary: 84 mg/dL (ref 70–99)
Glucose-Capillary: 288 mg/dL — ABNORMAL HIGH (ref 70–99)
Glucose-Capillary: 178 mg/dL — ABNORMAL HIGH (ref 70–99)

## 2019-09-04 MED ORDER — SODIUM CHLORIDE 0.9 % IV SOLN
2.0000 g | INTRAVENOUS | Status: DC
  Administered 2019-09-04 – 2019-09-09 (×6): 2 g via INTRAVENOUS
  Filled 2019-09-04: qty 2
  Filled 2019-09-04: qty 20
  Filled 2019-09-04 (×2): qty 2
  Filled 2019-09-04: qty 20
  Filled 2019-09-04 (×2): qty 2

## 2019-09-04 NOTE — Progress Notes (Signed)
Pt c/o SOB on room air. O2 sat = 91%. Lung sounds clear. MD paged.

## 2019-09-04 NOTE — Progress Notes (Signed)
PROGRESS NOTE    Luke [name]  QBV:[id number] DOB: 15-Sep-1980 DOA: 08/27/2019 PCP: Gildardo Pounds, NP    Brief Narrative: 39 year old male with type 1 diabetes IV drug abuse polysubstance abuse seizures endocarditis stage III CKD peripheral vascular disease left great toe amputation and hypothyroidism and hypertension admitted with shortness of breath. He was found to have left arm cellulitis with a deep ulcer on 08/20/2019 in the emergency room. Plastic surgery recommended surgical intervention patient left AMA on 08/21/2019. His friend brought him back to the hospital since patient had substernal chest pain and could not breathe.  Assessment & Plan:   Principal Problem:   Endocarditis of mitral valve Active Problems:   Drug abuse, IV ([name])   Tobacco abuse   CKD (chronic kidney disease) stage 3, GFR 30-59 ml/min   Uncontrolled insulin dependent type 1 diabetes mellitus ([place])   Diabetic ulcer of left foot ([name])   Hypothyroidism   Heroin abuse ([place])   Congestive heart failure (CHF) ([name])   Arm wound   Endocarditis of pulmonic valve  #1 mitral valve endocarditis-echo shows mitral valve vegetation with moderate regurgitation. Possible vegetation also on the pulmonic valve. On vancomycin and cefepime. ID following. Recommending 4 weeks of antibiotic therapy. Previous attending discussed with cardiology on 08/29/2019. It was thought that MR was not severe enough to do any surgical intervention at this time. He was seen by Dr. Kipp Brood in the past and was deemed not a good candidate for surgery.  #2 right forearm necrosis status post surgery 6/15/2021status post debridement of the right forearm and debridement of the left forearm.  #3 type 1 diabetes uncontrolled with hyperglycemia CBG (last 3) CBG (last 3)  Recent Labs    09/03/19 2039 09/04/19 0636 09/04/19 1152  GLUCAP 163* 140* 84    #4 stage IIIa CKD stable monitor on vancomycin. Creatinine 1.30  #5 left second  toe ulcer continue wound care  #6 polysubstance abuse on clonidine taper.  Consulted TOC to provide resources  #7 normocytic anemia stable no evidence of active bleeding  #8 hepatitis C outpatient ID follow-up  #9 history of essential hypertension-blood pressure 117/77 continue Norvasc and carvedilol.                 #10 tobacco abuse continue nicotine patch  #11 hypothyroidism on levothyroxine  #12 hyperlipidemia on Lipitor.  #13 pain control I have started him on long-acting MS Contin and Dilaudid for breakthrough pain.    Titrate down as tolerated.  Nutrition Problem: Increased nutrient needs Etiology: wound healing     Signs/Symptoms: estimated needs    Interventions: MVI, Juven, Premier Protein  Estimated body mass index is 25.24 kg/m as calculated from the following:   Height as of this encounter: 5\' 9"  (1.753 m).   Weight as of this encounter: 77.5 kg.  DVT prophylaxis:scd Code Status:full Family Communication:none Disposition Plan:Status is: Inpatient  Dispo: Patient From: Home Planned Disposition: To be determined Expected discharge date: 09/04/19 Medically stable for discharge: No  Consultants:Infectious disease, plastic surgery  Procedures:Status post debridement of the right and left 09/02/2019 Antimicrobials:Vancomycin and cefepime  Subjective: No complaints today however he felt short of breath yesterday for short period of time now he is back to normal.  Objective: Vitals:   09/03/19 2042 09/04/19 0149 09/04/19 0424 09/04/19 0828  BP: 117/82  121/77 117/77  Pulse: 85  91 86  Resp: 17  18 17   Temp: 98.4 F (36.9 C)  98.4 F (36.9 C)  TempSrc: Oral  Oral   SpO2: 99%  91% 100%  Weight:  77.5 kg    Height:        Intake/Output Summary (Last 24 hours) at 09/04/2019 1415 Last data filed at 09/04/2019 1337 Gross per 24 hour  Intake 1371.31 ml  Output 2575 ml  Net -1203.69  ml   Filed Weights   09/03/19 0420 09/03/19 1200 09/04/19 0149  Weight: 53.8 kg 76.7 kg 77.5 kg    Examination:  General exam: Appears calm and comfortable  Respiratory system: Clear to auscultation. Respiratory effort normal. Cardiovascular system: S1 & S2 heard, RRR. No JVD, systolic murmur murmurs, rubs, gallops or clicks. No pedal edema. Gastrointestinal system: Abdomen is nondistended, soft and nontender. No organomegaly or masses felt. Normal bowel sounds heard. Central nervous system: Alert and oriented. No focal neurological deficits. Extremities: Symmetric 5 x 5 power. Skin: No rashes, lesions or ulcers Psychiatry: Judgement and insight appear normal. Mood & affect appropriate.     Data Reviewed: I have personally reviewed following labs and imaging studies  CBC: Recent Labs  Lab 08/30/19 0500 08/31/19 1249 09/01/19 0451 09/02/19 0417 09/03/19 0500  WBC 15.0* 16.6* 16.2* 16.2* 14.3*  HGB 9.2* 9.1* 9.2* 9.1* 8.7*  HCT 28.5* 28.2* 29.0* 28.7* 27.5*  MCV 80.1 80.3 81.0 82.2 83.6  PLT 558* 519* 547* 505* 767*   Basic Metabolic Panel: Recent Labs  Lab 08/30/19 0500 08/31/19 1249 09/01/19 0451 09/02/19 0417 09/03/19 0500  NA 135 137 139 140 139  K 3.5 3.9 3.3* 3.8 3.7  CL 104 109 110 112* 110  CO2 22 20* 20* 21* 22  GLUCOSE 196* 127* 164* 71 71  BUN 14 15 13 16  23*  CREATININE 1.35* 1.45* 1.34* 1.46* 1.30*  CALCIUM 8.7* 8.6* 8.5* 8.6* 8.6*   GFR: Estimated Creatinine Clearance: 76.3 mL/min (A) (by C-G formula based on SCr of 1.3 mg/dL (H)). Liver Function Tests: Recent Labs  Lab 08/29/19 0625  AST 13*  ALT 12  ALKPHOS 96  BILITOT 0.4  PROT 6.1*  ALBUMIN 2.6*   No results for input(s): LIPASE, AMYLASE in the last 168 hours. No results for input(s): AMMONIA in the last 168 hours. Coagulation Profile: No results for input(s): INR, PROTIME in the last 168 hours. Cardiac Enzymes: No results for input(s): CKTOTAL, CKMB, CKMBINDEX, TROPONINI in the  last 168 hours. BNP (last 3 results) No results for input(s): PROBNP in the last 8760 hours. HbA1C: No results for input(s): HGBA1C in the last 72 hours. CBG: Recent Labs  Lab 09/03/19 1338 09/03/19 1641 09/03/19 2039 09/04/19 0636 09/04/19 1152  GLUCAP 93 209* 163* 140* 84   Lipid Profile: No results for input(s): CHOL, HDL, LDLCALC, TRIG, CHOLHDL, LDLDIRECT in the last 72 hours. Thyroid Function Tests: No results for input(s): TSH, T4TOTAL, FREET4, T3FREE, THYROIDAB in the last 72 hours. Anemia Panel: No results for input(s): VITAMINB12, FOLATE, FERRITIN, TIBC, IRON, RETICCTPCT in the last 72 hours. Sepsis Labs: No results for input(s): PROCALCITON, LATICACIDVEN in the last 168 hours.  Recent Results (from the past 240 hour(s))  SARS Coronavirus 2 by RT PCR (hospital order, performed in [facility] hospital lab) Nasopharyngeal Nasopharyngeal Swab     Status: None   Collection Time: 08/27/19  5:42 PM   Specimen: Nasopharyngeal Swab  Result Value Ref Range Status   SARS Coronavirus 2 NEGATIVE NEGATIVE Final    Comment: (NOTE) SARS-CoV-2 target nucleic acids are NOT DETECTED.  The SARS-CoV-2 RNA is generally detectable in upper and lower  respiratory specimens during the acute phase of infection. The lowest concentration of SARS-CoV-2 viral copies this assay can detect is 250 copies / mL. A negative result does not preclude SARS-CoV-2 infection and should not be used as the sole basis for treatment or other patient management decisions.  A negative result may occur with improper specimen collection / handling, submission of specimen other than nasopharyngeal swab, presence of viral mutation(s) within the areas targeted by this assay, and inadequate number of viral copies (<250 copies / mL). A negative result must be combined with clinical observations, patient history, and epidemiological information.  Fact Sheet for Patients:    StrictlyIdeas.no  Fact Sheet for Healthcare Providers: BankingDealers.co.za  This test is not yet approved or  cleared by the [place] FDA and has been authorized for detection and/or diagnosis of SARS-CoV-2 by FDA under an Emergency Use Authorization (EUA).  This EUA will remain in effect (meaning this test can be used) for the duration of the COVID-19 declaration under Section 564(b)(1) of the Act, 21 U.S.C. section 360bbb-3(b)(1), unless the authorization is terminated or revoked sooner.  Performed at [facility], [place] [address], [place], [place] [address]   Blood Cultures x 2 sites     Status: None   Collection Time: 08/27/19  9:36 PM   Specimen: BLOOD RIGHT WRIST  Result Value Ref Range Status   Specimen Description BLOOD RIGHT WRIST  Final   Special Requests   Final    BOTTLES DRAWN AEROBIC AND ANAEROBIC Blood Culture adequate volume   Culture   Final    NO GROWTH 5 DAYS Performed at [facility], [place] [address], [place], [place] [address]    Report Status 09/01/2019 FINAL  Final  Blood Cultures x 2 sites     Status: None   Collection Time: 08/27/19  9:46 PM   Specimen: BLOOD LEFT WRIST  Result Value Ref Range Status   Specimen Description BLOOD LEFT WRIST  Final   Special Requests   Final    BOTTLES DRAWN AEROBIC AND ANAEROBIC Blood Culture adequate volume   Culture   Final    NO GROWTH 5 DAYS Performed at [facility], [place] [address] [place], [place] [address]    Report Status 09/01/2019 FINAL  Final  Surgical pcr screen     Status: Abnormal   Collection Time: 08/31/19 12:19 PM   Specimen: Nasal Mucosa; Nasal Swab  Result Value Ref Range Status   MRSA, PCR POSITIVE (A) NEGATIVE Final    Comment: RESULT CALLED TO, READ BACK BY AND VERIFIED WITH: Octavio Manns RN 14:30 08/31/19 ([email or web address])    Staphylococcus aureus POSITIVE (A) NEGATIVE Final    Comment: (NOTE) The Xpert SA Assay (FDA approved  for NASAL specimens in patients 83 years of age and older), is one component of a comprehensive surveillance program. It is not intended to diagnose infection nor to guide or monitor treatment. Performed at [facility], [place] [address], [place], [place] [address]          Radiology Studies: DG CHEST PORT 1 VIEW  Result Date: 09/04/2019 CLINICAL DATA:  39 year old male with shortness of breath. EXAM: PORTABLE CHEST 1 VIEW COMPARISON:  Portable chest 08/27/2019 and earlier. FINDINGS: Portable AP semi upright view at 0534 hours. Lower lung volumes. Persistent small pleural effusions. No pneumothorax. Ongoing asymmetric bilateral pulmonary interstitial opacity greater on the right. Left perihilar ventilation appears improved. No areas of consolidation. Mediastinal contours remain normal. Negative  visible bowel gas pattern, osseous structures. IMPRESSION: 1. Lower lung volumes.  Continued small pleural effusions. 2. Ongoing asymmetric pulmonary interstitial opacity, with regressed left perihilar involvement from 08/27/2019. This remains nonspecific with main differential considerations of asymmetric edema, viral/atypical infection, and noninfectious pulmonary inflammation (perhaps Pulmonary Talcosis in the setting of IVDA). Electronically Signed   By: Genevie Ann M.D.   On: 09/04/2019 08:54        Scheduled Meds: . amLODipine  5 mg Oral Daily  . atorvastatin  20 mg Oral Daily  . carvedilol  12.5 mg Oral BID WC  . Chlorhexidine Gluconate Cloth  6 each Topical Q0600  . docusate sodium  100 mg Oral BID  . DULoxetine  30 mg Oral Daily  . enoxaparin (LOVENOX) injection  40 mg Subcutaneous Q24H  . gabapentin  400 mg Oral TID  . insulin aspart  0-15 Units Subcutaneous TID WC  . insulin aspart  0-5 Units Subcutaneous QHS  . insulin glargine  20 Units Subcutaneous QHS  . levothyroxine  50 mcg Oral Q0600  . morphine  30 mg Oral Q12H  . multivitamin with minerals  1 tablet Oral Daily  .  mupirocin ointment  1 application Nasal BID  . nicotine  14 mg Transdermal Daily  . nutrition supplement (JUVEN)  1 packet Oral BID BM  . pantoprazole  40 mg Oral Daily  . Ensure Max Protein  11 oz Oral QHS  . sertraline  50 mg Oral Daily  . sodium chloride flush  10-40 mL Intracatheter Q12H  . sodium chloride flush  3 mL Intravenous Q12H  . traZODone  50 mg Oral QHS   Continuous Infusions: . sodium chloride Stopped (09/03/19 2303)  . cefTRIAXone (ROCEPHIN)  IV    . lactated ringers    . vancomycin Stopped (09/03/19 1820)     LOS: 8 days     Georgette Shell, MD

## 2019-09-04 NOTE — Progress Notes (Deleted)
[facility]  Date of Admission:  08/27/2019      Total days of antibiotics 8  Vancomycin + Cefepime     ASSESSMENT: Luke [name] is a 39 y.o. male with recent injection drug use resulting in chronic necrotic wounds to bilateral forearms and vegetations noted on both native mitral and pulmonic valves, culture negative from blood. Suspect fastidious organism - will continue vancomycin and cefepime for now pending re-evaluation of debrided arms. If these look clean and healing well we may be able to change cefepime to ceftriaxone. Will reassess after 2 weeks to determine if he would be a candidate for long acting injectable treatment + oral levaquin with close outpatient management. Loud systolic murmur - would repeat Echo again after he completes treatment. He denies any dental pain currently.   I think with regards to Suboxone for opioid replacement, he has never been properly attempted on this to help manage withdrawals; he would be open to using this to help bridge him to drug rehab program - would recommend LCSW / CM to discuss outpatient resources for him if not already done.   Diabetes has been labile in the setting of frequent infections. I explained to him today that if we can treat his infections and correct the problem with drug use to prevent future infections this will make his diabetes much easier to control.   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 His wife would benefit from screening, presuming they are sexually active.    PLAN: 1. Continue cefepime + vancomycin  2. Appreciate any assistance from CM/LCSW for drug rehab coordination  3. Follow surgery for wound status  4. Hep C tx outpatient      Principal Problem:   Endocarditis of mitral valve Active Problems:   Drug abuse, IV ([name])   Tobacco abuse   CKD (chronic kidney disease) stage 3, GFR 30-59 ml/min   Uncontrolled insulin dependent  type 1 diabetes mellitus ([name])   Diabetic ulcer of left foot ([name])   Hypothyroidism   Heroin abuse ([place])   Congestive heart failure (CHF) ([name])   Arm wound   . amLODipine  5 mg Oral Daily  . atorvastatin  20 mg Oral Daily  . carvedilol  12.5 mg Oral BID WC  . Chlorhexidine Gluconate Cloth  6 each Topical Q0600  . docusate sodium  100 mg Oral BID  . DULoxetine  30 mg Oral Daily  . enoxaparin (LOVENOX) injection  40 mg Subcutaneous Q24H  . gabapentin  400 mg Oral TID  . insulin aspart  0-15 Units Subcutaneous TID WC  . insulin aspart  0-5 Units Subcutaneous QHS  . insulin glargine  20 Units Subcutaneous QHS  . levothyroxine  50 mcg Oral Q0600  . morphine  30 mg Oral Q12H  . multivitamin with minerals  1 tablet Oral Daily  . mupirocin ointment  1 application Nasal BID  . nicotine  14 mg Transdermal Daily  . nutrition supplement (JUVEN)  1 packet Oral BID BM  . pantoprazole  40 mg Oral Daily  . Ensure Max Protein  11 oz Oral QHS  . sertraline  50 mg Oral Daily  . sodium chloride flush  10-40 mL Intracatheter Q12H  . sodium chloride flush  3 mL Intravenous Q12H  . traZODone  50 mg Oral QHS    SUBJECTIVE: Doing well - only concern is the pain in his  arms. States his pain medications are going to be titrated down now and he is OK with this plan. Would consider Suboxone with outpatient FU for opioid dependence. His wife is in the room today and encourages him to work on the plan for drug rehab.    Review of Systems: Review of Systems  Constitutional: Negative for chills, diaphoresis and fever.  HENT: Negative for tinnitus.   Eyes: Negative for blurred vision and photophobia.  Respiratory: Positive for shortness of breath. Negative for cough and sputum production.   Cardiovascular: Negative for chest pain and leg swelling.  Gastrointestinal: Negative for diarrhea, nausea and vomiting.  Genitourinary: Negative for dysuria.  Musculoskeletal: Negative for joint pain and myalgias.    Skin: Negative for rash.       Chronic wounds to b/l forearms   Neurological: Negative for weakness and headaches.    No Known Allergies  OBJECTIVE: Vitals:   09/03/19 2042 09/04/19 0149 09/04/19 0424 09/04/19 0828  BP: 117/82  121/77 117/77  Pulse: 85  91 86  Resp: 17  18 17   Temp: 98.4 F (36.9 C)  98.4 F (36.9 C)   TempSrc: Oral  Oral   SpO2: 99%  91% 100%  Weight:  77.5 kg    Height:       Body mass index is 25.24 kg/m.  Physical Exam Vitals reviewed.  Constitutional:      Appearance: Normal appearance. He is not ill-appearing.     Comments: Sitting on the side of the bed. Appears comfortable.   HENT:     Mouth/Throat:     Mouth: Mucous membranes are moist.     Pharynx: Oropharynx is clear.  Eyes:     General: No scleral icterus. Cardiovascular:     Rate and Rhythm: Normal rate and regular rhythm.     Pulses: Normal pulses.     Heart sounds: Murmur (3/6 systolic murmur ) heard.   Pulmonary:     Effort: Pulmonary effort is normal.     Breath sounds: Normal breath sounds.  Chest:     Chest wall: No tenderness.  Abdominal:     General: Bowel sounds are normal. There is no distension.     Palpations: Abdomen is soft. There is no fluid wave, hepatomegaly or splenomegaly.     Tenderness: There is no abdominal tenderness.  Musculoskeletal:        General: No swelling.     Right lower leg: No edema.     Left lower leg: No edema.     Comments: B/L Forearms wrapped in clean and dry ace wraps  Skin:    Capillary Refill: Capillary refill takes less than 2 seconds.     Coloration: Skin is not jaundiced.     Findings: No rash.  Neurological:     Mental Status: He is alert and oriented to person, place, and time.  Psychiatric:        Behavior: Behavior normal.        Judgment: Judgment normal.     Lab Results Lab Results  Component Value Date   WBC 14.3 (H) 09/03/2019   HGB 8.7 (L) 09/03/2019   HCT 27.5 (L) 09/03/2019   MCV 83.6 09/03/2019   PLT 458  (H) 09/03/2019    Lab Results  Component Value Date   CREATININE 1.30 (H) 09/03/2019   BUN 23 (H) 09/03/2019   NA 139 09/03/2019   K 3.7 09/03/2019   CL 110 09/03/2019   CO2 22 09/03/2019  Lab Results  Component Value Date   ALT 12 08/29/2019   AST 13 (L) 08/29/2019   ALKPHOS 96 08/29/2019   BILITOT 0.4 08/29/2019     Microbiology: Recent Results (from the past 240 hour(s))  SARS Coronavirus 2 by RT PCR (hospital order, performed in [facility] hospital lab) Nasopharyngeal Nasopharyngeal Swab     Status: None   Collection Time: 08/27/19  5:42 PM   Specimen: Nasopharyngeal Swab  Result Value Ref Range Status   SARS Coronavirus 2 NEGATIVE NEGATIVE Final    Comment: (NOTE) SARS-CoV-2 target nucleic acids are NOT DETECTED.  The SARS-CoV-2 RNA is generally detectable in upper and lower respiratory specimens during the acute phase of infection. The lowest concentration of SARS-CoV-2 viral copies this assay can detect is 250 copies / mL. A negative result does not preclude SARS-CoV-2 infection and should not be used as the sole basis for treatment or other patient management decisions.  A negative result may occur with improper specimen collection / handling, submission of specimen other than nasopharyngeal swab, presence of viral mutation(s) within the areas targeted by this assay, and inadequate number of viral copies (<250 copies / mL). A negative result must be combined with clinical observations, patient history, and epidemiological information.  Fact Sheet for Patients:   StrictlyIdeas.no  Fact Sheet for Healthcare Providers: BankingDealers.co.za  This test is not yet approved or  cleared by the [place] FDA and has been authorized for detection and/or diagnosis of SARS-CoV-2 by FDA under an Emergency Use Authorization (EUA).  This EUA will remain in effect (meaning this test can be used) for the duration of  the COVID-19 declaration under Section 564(b)(1) of the Act, 21 U.S.C. section 360bbb-3(b)(1), unless the authorization is terminated or revoked sooner.  Performed at [facility], [place] [address] [place], [place] [address]   Blood Cultures x 2 sites     Status: None   Collection Time: 08/27/19  9:36 PM   Specimen: BLOOD RIGHT WRIST  Result Value Ref Range Status   Specimen Description BLOOD RIGHT WRIST  Final   Special Requests   Final    BOTTLES DRAWN AEROBIC AND ANAEROBIC Blood Culture adequate volume   Culture   Final    NO GROWTH 5 DAYS Performed at [facility], [place] [address] [place], [place] [address]    Report Status 09/01/2019 FINAL  Final  Blood Cultures x 2 sites     Status: None   Collection Time: 08/27/19  9:46 PM   Specimen: BLOOD LEFT WRIST  Result Value Ref Range Status   Specimen Description BLOOD LEFT WRIST  Final   Special Requests   Final    BOTTLES DRAWN AEROBIC AND ANAEROBIC Blood Culture adequate volume   Culture   Final    NO GROWTH 5 DAYS Performed at [facility], [place] [address], [place],  [address]    Report Status 09/01/2019 FINAL  Final  Surgical pcr screen     Status: Abnormal   Collection Time: 08/31/19 12:19 PM   Specimen: Nasal Mucosa; Nasal Swab  Result Value Ref Range Status   MRSA, PCR POSITIVE (A) NEGATIVE Final    Comment: RESULT CALLED TO, READ BACK BY AND VERIFIED WITH: Octavio Manns RN 14:30 08/31/19 ([email or web address])    Staphylococcus aureus POSITIVE (A) NEGATIVE Final    Comment: (NOTE) The Xpert SA Assay (FDA approved for NASAL specimens in patients 95 years of age and older), is one component of a  comprehensive surveillance program. It is not intended to diagnose infection nor to guide or monitor treatment. Performed at [facility], [place] [address] [place], [place] [address]      [name], MSN, NP-C [facility] [email or web address] Pager: [phone] Office: [place]: [phone]

## 2019-09-04 NOTE — Progress Notes (Signed)
[facility]  Date of Admission:  08/27/2019      Total days of antibiotics 9  Vancomycin + Cefepime     ASSESSMENT: Luke [name] is a 39 y.o. male with recent injection drug use resulting in chronic necrotic wounds to bilateral forearms and vegetations noted on both native mitral and pulmonic valves, culture negative from blood. Continue vancomycin. Can change cefepime to ceftriaxone to cover culture negative endocarditis now that we see forearm wounds are healing well and no concern for ongoing infection. Continue IV therapy with reassessment Wednesday to determine if further IV needed vs conversion to PO for ongoing therapy. He is OK with staying the full 4 weeks if that is the best decision next week.   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    PLAN: 1. Continue vancomycin  2. Change cefepime to ceftriaxone 3. Hep C tx outpatient    Dr. Johnnye Sima is available over the weekend for questions at [phone]. Otherwise we will see him back next week.    Principal Problem:   Endocarditis of mitral valve Active Problems:   Drug abuse, IV ([name])   Tobacco abuse   CKD (chronic kidney disease) stage 3, GFR 30-59 ml/min   Uncontrolled insulin dependent type 1 diabetes mellitus ([place])   Diabetic ulcer of left foot ([name])   Hypothyroidism   Heroin abuse ([place])   Congestive heart failure (CHF) ([name])   Arm wound   . amLODipine  5 mg Oral Daily  . atorvastatin  20 mg Oral Daily  . carvedilol  12.5 mg Oral BID WC  . Chlorhexidine Gluconate Cloth  6 each Topical Q0600  . docusate sodium  100 mg Oral BID  . DULoxetine  30 mg Oral Daily  . enoxaparin (LOVENOX) injection  40 mg Subcutaneous Q24H  . gabapentin  400 mg Oral TID  . insulin aspart  0-15 Units Subcutaneous TID WC  . insulin aspart  0-5 Units  Subcutaneous QHS  . insulin glargine  20 Units Subcutaneous QHS  . levothyroxine  50 mcg Oral Q0600  . morphine  30 mg Oral Q12H  . multivitamin with minerals  1 tablet Oral Daily  . mupirocin ointment  1 application Nasal BID  . nicotine  14 mg Transdermal Daily  . nutrition supplement (JUVEN)  1 packet Oral BID BM  . pantoprazole  40 mg Oral Daily  . Ensure Max Protein  11 oz Oral QHS  . sertraline  50 mg Oral Daily  . sodium chloride flush  10-40 mL Intracatheter Q12H  . sodium chloride flush  3 mL Intravenous Q12H  . traZODone  50 mg Oral QHS    SUBJECTIVE: Having some shortness of breath today. No chest pain or cough just heavy to breathe. Feels much better with nasal cannula on.  No other concerns.    Review of Systems: Review of Systems  Constitutional: Negative for chills, diaphoresis and fever.  HENT: Negative for tinnitus.   Eyes: Negative for blurred vision and photophobia.  Respiratory: Positive for shortness of breath. Negative for cough and sputum production.   Cardiovascular: Negative for chest pain and leg swelling.  Gastrointestinal: Negative for diarrhea, nausea and vomiting.  Genitourinary: Negative for dysuria.  Musculoskeletal: Negative for joint pain and myalgias.  Skin: Negative for rash.  Chronic wounds to b/l forearms   Neurological: Negative for weakness and headaches.    No Known Allergies  OBJECTIVE: Vitals:   09/03/19 2042 09/04/19 0149 09/04/19 0424 09/04/19 0828  BP: 117/82  121/77 117/77  Pulse: 85  91 86  Resp: 17  18 17   Temp: 98.4 F (36.9 C)  98.4 F (36.9 C)   TempSrc: Oral  Oral   SpO2: 99%  91% 100%  Weight:  77.5 kg    Height:       Body mass index is 25.24 kg/m.  Physical Exam Vitals reviewed.  Constitutional:      Appearance: Normal appearance. He is not ill-appearing.     Comments: Resting under blankets. No distress  Eyes:     General: No scleral icterus. Cardiovascular:     Rate and Rhythm: Normal rate  and regular rhythm.     Heart sounds: Murmur (3/6 systolic murmur ) heard.   Pulmonary:     Effort: Pulmonary effort is normal.     Breath sounds: Normal breath sounds.     Comments: 2LPM via [place]  Chest:     Chest wall: No tenderness.  Abdominal:     General: Bowel sounds are normal. There is no distension.     Palpations: Abdomen is soft. There is no fluid wave, hepatomegaly or splenomegaly.     Tenderness: There is no abdominal tenderness.  Musculoskeletal:        General: No swelling.     Right lower leg: No edema.     Left lower leg: No edema.     Comments: Wound photos from plastics surgery team reviewed - wound bed looks very clean and has undergone thorough debridement.   Skin:    Capillary Refill: Capillary refill takes less than 2 seconds.     Coloration: Skin is not jaundiced.     Findings: No rash.  Neurological:     Mental Status: He is alert and oriented to person, place, and time.  Psychiatric:        Behavior: Behavior normal.        Judgment: Judgment normal.     Lab Results Lab Results  Component Value Date   WBC 14.3 (H) 09/03/2019   HGB 8.7 (L) 09/03/2019   HCT 27.5 (L) 09/03/2019   MCV 83.6 09/03/2019   PLT 458 (H) 09/03/2019    Lab Results  Component Value Date   CREATININE 1.30 (H) 09/03/2019   BUN 23 (H) 09/03/2019   NA 139 09/03/2019   K 3.7 09/03/2019   CL 110 09/03/2019   CO2 22 09/03/2019    Lab Results  Component Value Date   ALT 12 08/29/2019   AST 13 (L) 08/29/2019   ALKPHOS 96 08/29/2019   BILITOT 0.4 08/29/2019     Microbiology: Recent Results (from the past 240 hour(s))  SARS Coronavirus 2 by RT PCR (hospital order, performed in [facility] hospital lab) Nasopharyngeal Nasopharyngeal Swab     Status: None   Collection Time: 08/27/19  5:42 PM   Specimen: Nasopharyngeal Swab  Result Value Ref Range Status   SARS Coronavirus 2 NEGATIVE NEGATIVE Final    Comment: (NOTE) SARS-CoV-2 target nucleic acids are NOT DETECTED.  The  SARS-CoV-2 RNA is generally detectable in upper and lower respiratory specimens during the acute phase of infection. The lowest concentration of SARS-CoV-2 viral copies this assay can detect is 250 copies / mL. A negative result does not preclude SARS-CoV-2 infection and should not be used  as the sole basis for treatment or other patient management decisions.  A negative result may occur with improper specimen collection / handling, submission of specimen other than nasopharyngeal swab, presence of viral mutation(s) within the areas targeted by this assay, and inadequate number of viral copies (<250 copies / mL). A negative result must be combined with clinical observations, patient history, and epidemiological information.  Fact Sheet for Patients:   StrictlyIdeas.no  Fact Sheet for Healthcare Providers: BankingDealers.co.za  This test is not yet approved or  cleared by the [place] FDA and has been authorized for detection and/or diagnosis of SARS-CoV-2 by FDA under an Emergency Use Authorization (EUA).  This EUA will remain in effect (meaning this test can be used) for the duration of the COVID-19 declaration under Section 564(b)(1) of the Act, 21 U.S.C. section 360bbb-3(b)(1), unless the authorization is terminated or revoked sooner.  Performed at [facility], [place] [address], [place], [place] [address]   Blood Cultures x 2 sites     Status: None   Collection Time: 08/27/19  9:36 PM   Specimen: BLOOD RIGHT WRIST  Result Value Ref Range Status   Specimen Description BLOOD RIGHT WRIST  Final   Special Requests   Final    BOTTLES DRAWN AEROBIC AND ANAEROBIC Blood Culture adequate volume   Culture   Final    NO GROWTH 5 DAYS Performed at [facility], [place] [address] [place], [place] [address]    Report Status 09/01/2019 FINAL  Final  Blood Cultures x 2 sites     Status: None   Collection Time: 08/27/19  9:46 PM     Specimen: BLOOD LEFT WRIST  Result Value Ref Range Status   Specimen Description BLOOD LEFT WRIST  Final   Special Requests   Final    BOTTLES DRAWN AEROBIC AND ANAEROBIC Blood Culture adequate volume   Culture   Final    NO GROWTH 5 DAYS Performed at [facility], [place] [address], [place], [place] [address]    Report Status 09/01/2019 FINAL  Final  Surgical pcr screen     Status: Abnormal   Collection Time: 08/31/19 12:19 PM   Specimen: Nasal Mucosa; Nasal Swab  Result Value Ref Range Status   MRSA, PCR POSITIVE (A) NEGATIVE Final    Comment: RESULT CALLED TO, READ BACK BY AND VERIFIED WITH: Octavio Manns RN 14:30 08/31/19 ([email or web address])    Staphylococcus aureus POSITIVE (A) NEGATIVE Final    Comment: (NOTE) The Xpert SA Assay (FDA approved for NASAL specimens in patients 29 years of age and older), is one component of a comprehensive surveillance program. It is not intended to diagnose infection nor to guide or monitor treatment. Performed at [facility], [place] [address] [place],  [address]      [name], MSN, NP-C [facility] Infectious Disease [email or web address] Pager: [phone] Office: [phone] [place]: [phone]

## 2019-09-05 LAB — GLUCOSE, CAPILLARY
Glucose-Capillary: 366 mg/dL — ABNORMAL HIGH (ref 70–99)
Glucose-Capillary: 208 mg/dL — ABNORMAL HIGH (ref 70–99)
Glucose-Capillary: 137 mg/dL — ABNORMAL HIGH (ref 70–99)
Glucose-Capillary: 226 mg/dL — ABNORMAL HIGH (ref 70–99)

## 2019-09-05 MED ORDER — INSULIN GLARGINE 100 UNIT/ML ~~LOC~~ SOLN
24.0000 [IU] | Freq: Every day | SUBCUTANEOUS | Status: DC
  Administered 2019-09-05 – 2019-09-07 (×3): 24 [IU] via SUBCUTANEOUS
  Filled 2019-09-05 (×4): qty 0.24

## 2019-09-05 MED ORDER — HYDROMORPHONE HCL 1 MG/ML IJ SOLN
1.0000 mg | INTRAMUSCULAR | Status: DC | PRN
  Administered 2019-09-05 – 2019-09-09 (×23): 1 mg via INTRAVENOUS
  Filled 2019-09-05 (×23): qty 1

## 2019-09-05 NOTE — Progress Notes (Addendum)
Luke NOTE    [name]  FVC:[id number] DOB: 1980/05/09 DOA: 08/27/2019 PCP: Gildardo Pounds, Luke    Brief Narrative:39 year old male with type 1 diabetes IV drug abuse polysubstance abuse seizures endocarditis stage III CKD peripheral vascular disease left great toe amputation and hypothyroidism and hypertension admitted with shortness of breath. He was found to have left arm cellulitis with a deep ulcer on 08/20/2019 in the emergency room. Plastic surgery recommended surgical intervention patient left AMA on 08/21/2019. His friend brought him back to the hospital since patient had substernal chest pain and could not breathe.  Assessment & Plan:   Principal Problem:   Endocarditis of mitral valve Active Problems:   Drug abuse, IV ([name])   Tobacco abuse   CKD (chronic kidney disease) stage 3, GFR 30-59 ml/min   Uncontrolled insulin dependent type 1 diabetes mellitus ([place])   Diabetic ulcer of left foot ([name])   Hypothyroidism   Heroin abuse ([place])   Congestive heart failure (CHF) ([name])   Arm wound   Endocarditis of pulmonic valve    #1 mitral valve endocarditis-echo shows mitral valve vegetation with moderate regurgitation. Possible vegetation also on the pulmonic valve. On vancomycin and cefepime. ID following. Recommending 4 weeks of antibiotic therapy. Previous attending discussed with cardiology on 08/29/2019. It was thought that MR was not severe enough to do any surgical intervention at this time. He was seen by Dr. Kipp Brood in the past and was deemed not a good candidate for surgery.  #2 right forearm necrosis status post surgery 6/15/2021status post debridement of the right forearm and debridement of the left forearm.  #3 type 1 diabetes uncontrolled with hyperglycemia CBG (last 3)  Recent Labs    09/04/19 2137 09/05/19 0614 09/05/19 1134  GLUCAP 178* 366* 208*   Increase lantus 24 units   #4 stage IIIa CKD stable monitor on vancomycin. Creatinine  1.30  #5 left second toe ulcer continue wound care  #6 polysubstance abuse on clonidine taper.Consulted TOC to provide resources  #7 normocytic anemia stable no evidence of active bleeding  #8 hepatitis C outpatient ID follow-up  #9 history of essential hypertension-blood pressure 99/61. Dc norvasc.  He is on Norvasc and carvedilol.      #10 tobacco abuse continue nicotine patch    #11 hypothyroidism on levothyroxine  #12 hyperlipidemia on Lipitor.  #13 pain control I have started him on long-acting MS Contin and Dilaudid for breakthrough pain.  Titrate down as tolerated.   Nutrition Problem: Increased nutrient needs Etiology: wound healing     Signs/Symptoms: estimated needs    Interventions: MVI, Juven, Premier Protein  Estimated body mass index is 21.91 kg/m as calculated from the following:   Height as of this encounter: 5\' 9"  (1.753 m).   Weight as of this encounter: 67.3 kg.  DVT prophylaxis:scd Code Status:full Family Communication:none Disposition Plan:Status is: Inpatient  Dispo: Patient From: Home Planned Disposition: To be determined Expected discharge date:unknown Medically stable for discharge: No patient needs to complete 4 weeks course of IV antibiotics for endocarditis inpatient not safe to discharge him home with PICC line with history of IV drug use  Consultants:Infectious disease, plastic surgery  Procedures:Status post debridement of the right and left 09/02/2019 Antimicrobials:Vancomycin and cefepime  Subjective:  Sitting in bed in no acute distress Objective: Vitals:   09/05/19 0200 09/05/19 0349 09/05/19 0905 09/05/19 1136  BP:  121/78 109/74 99/61  Pulse:  89 84 84  Resp:  17 16 18   Temp:  97.7  F (36.5 C) 98.5 F (36.9 C) 98 F (36.7 C)  TempSrc:  Oral Oral Oral  SpO2:  99% 98% 100%  Weight: 67.3 kg     Height:        Intake/Output Summary  (Last 24 hours) at 09/05/2019 1431 Last data filed at 09/05/2019 1300 Gross per 24 hour  Intake 1086.95 ml  Output 1150 ml  Net -63.05 ml   Filed Weights   09/03/19 1200 09/04/19 0149 09/05/19 0200  Weight: 76.7 kg 77.5 kg 67.3 kg    Examination:  General exam: Appears calm and comfortable  Respiratory system: Clear to auscultation. Respiratory effort normal. Cardiovascular system: S1 & S2 heard, RRR. No JVD,systolic murmurs, rubs, gallops or clicks. No pedal edema. Gastrointestinal system: Abdomen is nondistended, soft and nontender. No organomegaly or masses felt. Normal bowel sounds heard. Central nervous system: Alert and oriented. No focal neurological deficits. Extremities: Symmetric 5 x 5 power. Skin: No rashes, lesions or ulcers Psychiatry: Judgement and insight appear normal. Mood & affect appropriate.     Data Reviewed: I have personally reviewed following labs and imaging studies  CBC: Recent Labs  Lab 08/30/19 0500 08/31/19 1249 09/01/19 0451 09/02/19 0417 09/03/19 0500  WBC 15.0* 16.6* 16.2* 16.2* 14.3*  HGB 9.2* 9.1* 9.2* 9.1* 8.7*  HCT 28.5* 28.2* 29.0* 28.7* 27.5*  MCV 80.1 80.3 81.0 82.2 83.6  PLT 558* 519* 547* 505* 193*   Basic Metabolic Panel: Recent Labs  Lab 08/30/19 0500 08/31/19 1249 09/01/19 0451 09/02/19 0417 09/03/19 0500  NA 135 137 139 140 139  K 3.5 3.9 3.3* 3.8 3.7  CL 104 109 110 112* 110  CO2 22 20* 20* 21* 22  GLUCOSE 196* 127* 164* 71 71  BUN 14 15 13 16  23*  CREATININE 1.35* 1.45* 1.34* 1.46* 1.30*  CALCIUM 8.7* 8.6* 8.5* 8.6* 8.6*   GFR: Estimated Creatinine Clearance: 72.6 mL/min (A) (by C-G formula based on SCr of 1.3 mg/dL (H)). Liver Function Tests: No results for input(s): AST, ALT, ALKPHOS, BILITOT, PROT, ALBUMIN in the last 168 hours. No results for input(s): LIPASE, AMYLASE in the last 168 hours. No results for input(s): AMMONIA in the last 168 hours. Coagulation Profile: No results for input(s): INR, PROTIME  in the last 168 hours. Cardiac Enzymes: No results for input(s): CKTOTAL, CKMB, CKMBINDEX, TROPONINI in the last 168 hours. BNP (last 3 results) No results for input(s): PROBNP in the last 8760 hours. HbA1C: No results for input(s): HGBA1C in the last 72 hours. CBG: Recent Labs  Lab 09/04/19 1152 09/04/19 1646 09/04/19 2137 09/05/19 0614 09/05/19 1134  GLUCAP 84 288* 178* 366* 208*   Lipid Profile: No results for input(s): CHOL, HDL, LDLCALC, TRIG, CHOLHDL, LDLDIRECT in the last 72 hours. Thyroid Function Tests: No results for input(s): TSH, T4TOTAL, FREET4, T3FREE, THYROIDAB in the last 72 hours. Anemia Panel: No results for input(s): VITAMINB12, FOLATE, FERRITIN, TIBC, IRON, RETICCTPCT in the last 72 hours. Sepsis Labs: No results for input(s): PROCALCITON, LATICACIDVEN in the last 168 hours.  Recent Results (from the past 240 hour(s))  SARS Coronavirus 2 by RT PCR (hospital order, performed in [facility] hospital lab) Nasopharyngeal Nasopharyngeal Swab     Status: None   Collection Time: 08/27/19  5:42 PM   Specimen: Nasopharyngeal Swab  Result Value Ref Range Status   SARS Coronavirus 2 NEGATIVE NEGATIVE Final    Comment: (NOTE) SARS-CoV-2 target nucleic acids are NOT DETECTED.  The SARS-CoV-2 RNA is generally detectable in upper and lower respiratory  specimens during the acute phase of infection. The lowest concentration of SARS-CoV-2 viral copies this assay can detect is 250 copies / mL. A negative result does not preclude SARS-CoV-2 infection and should not be used as the sole basis for treatment or other patient management decisions.  A negative result may occur with improper specimen collection / handling, submission of specimen other than nasopharyngeal swab, presence of viral mutation(s) within the areas targeted by this assay, and inadequate number of viral copies (<250 copies / mL). A negative result must be combined with clinical observations, patient  history, and epidemiological information.  Fact Sheet for Patients:   StrictlyIdeas.no  Fact Sheet for Healthcare Providers: BankingDealers.co.za  This test is not yet approved or  cleared by the [place] FDA and has been authorized for detection and/or diagnosis of SARS-CoV-2 by FDA under an Emergency Use Authorization (EUA).  This EUA will remain in effect (meaning this test can be used) for the duration of the COVID-19 declaration under Section 564(b)(1) of the Act, 21 U.S.C. section 360bbb-3(b)(1), unless the authorization is terminated or revoked sooner.  Performed at [facility], [place] [address] [place], [place] [address]   Blood Cultures x 2 sites     Status: None   Collection Time: 08/27/19  9:36 PM   Specimen: BLOOD RIGHT WRIST  Result Value Ref Range Status   Specimen Description BLOOD RIGHT WRIST  Final   Special Requests   Final    BOTTLES DRAWN AEROBIC AND ANAEROBIC Blood Culture adequate volume   Culture   Final    NO GROWTH 5 DAYS Performed at [facility], [place] [address] [place], [place] [address]    Report Status 09/01/2019 FINAL  Final  Blood Cultures x 2 sites     Status: None   Collection Time: 08/27/19  9:46 PM   Specimen: BLOOD LEFT WRIST  Result Value Ref Range Status   Specimen Description BLOOD LEFT WRIST  Final   Special Requests   Final    BOTTLES DRAWN AEROBIC AND ANAEROBIC Blood Culture adequate volume   Culture   Final    NO GROWTH 5 DAYS Performed at [facility], [place] [address] [place], [place] [address]    Report Status 09/01/2019 FINAL  Final  Surgical pcr screen     Status: Abnormal   Collection Time: 08/31/19 12:19 PM   Specimen: Nasal Mucosa; Nasal Swab  Result Value Ref Range Status   MRSA, PCR POSITIVE (A) NEGATIVE Final    Comment: RESULT CALLED TO, READ BACK BY AND VERIFIED WITH: Octavio Manns RN 14:30 08/31/19 ([email or web address])    Staphylococcus aureus POSITIVE (A)  NEGATIVE Final    Comment: (NOTE) The Xpert SA Assay (FDA approved for NASAL specimens in patients 38 years of age and older), is one component of a comprehensive surveillance program. It is not intended to diagnose infection nor to guide or monitor treatment. Performed at [facility], [place] [address], [place], [place] [address]          Radiology Studies: DG CHEST PORT 1 VIEW  Result Date: 09/04/2019 CLINICAL DATA:  39 year old male with shortness of breath. EXAM: PORTABLE CHEST 1 VIEW COMPARISON:  Portable chest 08/27/2019 and earlier. FINDINGS: Portable AP semi upright view at 0534 hours. Lower lung volumes. Persistent small pleural effusions. No pneumothorax. Ongoing asymmetric bilateral pulmonary interstitial opacity greater on the right. Left perihilar ventilation appears improved. No areas of consolidation. Mediastinal contours remain normal. Negative visible  bowel gas pattern, osseous structures. IMPRESSION: 1. Lower lung volumes.  Continued small pleural effusions. 2. Ongoing asymmetric pulmonary interstitial opacity, with regressed left perihilar involvement from 08/27/2019. This remains nonspecific with main differential considerations of asymmetric edema, viral/atypical infection, and noninfectious pulmonary inflammation (perhaps Pulmonary Talcosis in the setting of IVDA). Electronically Signed   By: Genevie Ann M.D.   On: 09/04/2019 08:54        Scheduled Meds: . amLODipine  5 mg Oral Daily  . atorvastatin  20 mg Oral Daily  . carvedilol  12.5 mg Oral BID WC  . Chlorhexidine Gluconate Cloth  6 each Topical Q0600  . docusate sodium  100 mg Oral BID  . DULoxetine  30 mg Oral Daily  . enoxaparin (LOVENOX) injection  40 mg Subcutaneous Q24H  . gabapentin  400 mg Oral TID  . insulin aspart  0-15 Units Subcutaneous TID WC  . insulin aspart  0-5 Units Subcutaneous QHS  . insulin glargine  20 Units Subcutaneous QHS  . levothyroxine  50 mcg Oral Q0600  . morphine  30 mg  Oral Q12H  . multivitamin with minerals  1 tablet Oral Daily  . nicotine  14 mg Transdermal Daily  . nutrition supplement (JUVEN)  1 packet Oral BID BM  . pantoprazole  40 mg Oral Daily  . Ensure Max Protein  11 oz Oral QHS  . sertraline  50 mg Oral Daily  . sodium chloride flush  10-40 mL Intracatheter Q12H  . sodium chloride flush  3 mL Intravenous Q12H  . traZODone  50 mg Oral QHS   Continuous Infusions: . sodium chloride Stopped (09/03/19 2303)  . cefTRIAXone (ROCEPHIN)  IV 2 g (09/05/19 1334)  . lactated ringers    . vancomycin 1,250 mg (09/04/19 1721)     LOS: 9 days     Georgette Shell, MD 09/05/2019, 2:31 PM

## 2019-09-06 LAB — GLUCOSE, CAPILLARY
Glucose-Capillary: 202 mg/dL — ABNORMAL HIGH (ref 70–99)
Glucose-Capillary: 221 mg/dL — ABNORMAL HIGH (ref 70–99)
Glucose-Capillary: 331 mg/dL — ABNORMAL HIGH (ref 70–99)
Glucose-Capillary: 176 mg/dL — ABNORMAL HIGH (ref 70–99)

## 2019-09-06 LAB — BASIC METABOLIC PANEL
Anion gap: 8 (ref 5–15)
BUN: 25 mg/dL — ABNORMAL HIGH (ref 6–20)
CO2: 23 mmol/L (ref 22–32)
Calcium: 8.5 mg/dL — ABNORMAL LOW (ref 8.9–10.3)
Chloride: 105 mmol/L (ref 98–111)
Creatinine, Ser: 1.26 mg/dL — ABNORMAL HIGH (ref 0.61–1.24)
GFR calc Af Amer: >60 mL/min (ref >60)
GFR calc non Af Amer: >60 mL/min (ref >60)
Glucose, Bld: 193 mg/dL — ABNORMAL HIGH (ref 70–99)
Potassium: 3.6 mmol/L (ref 3.5–5.1)
Sodium: 136 mmol/L (ref 135–145)

## 2019-09-06 LAB — VANCOMYCIN, TROUGH: Vancomycin Tr: 10 ug/mL — ABNORMAL LOW (ref 15–20)

## 2019-09-06 MED ORDER — VANCOMYCIN HCL 1750 MG/350ML IV SOLN
1750.0000 mg | INTRAVENOUS | Status: DC
  Administered 2019-09-06 – 2019-09-08 (×3): 1750 mg via INTRAVENOUS
  Filled 2019-09-06 (×4): qty 350

## 2019-09-06 MED ORDER — CYCLOBENZAPRINE HCL 5 MG PO TABS
5.0000 mg | ORAL_TABLET | Freq: Three times a day (TID) | ORAL | Status: DC | PRN
  Administered 2019-09-06 – 2019-09-10 (×10): 5 mg via ORAL
  Filled 2019-09-06 (×11): qty 1

## 2019-09-06 NOTE — Progress Notes (Signed)
Pharmacy Antibiotic Note  Luke [name] is a 39 y.o. male admitted on 08/27/2019 with MV and PV endocarditis for Vancomycin. Vancomycin trough now subtherapeutic at 10 mcg/ml, renal function is more or less stable.  Plan: -Increase vancomycin to 1750mg  IV q24h -Recheck trough at Css - if remains low would retry q12h dosing -Watch SCr  Height: 5\' 9"  (175.3 cm) Weight: 77.1 kg (169 lb 14.4 oz) (scale c) IBW/kg (Calculated) : 70.7  Temp (24hrs), Avg:98.6 F (37 C), Min:98.4 F (36.9 C), Max:98.8 F (37.1 C)  Recent Labs  Lab 08/31/19 1249 09/01/19 0451 09/02/19 0417 09/03/19 0500 09/03/19 0521 09/06/19 0631 09/06/19 1648  WBC 16.6* 16.2* 16.2* 14.3*  --   --   --   CREATININE 1.45* 1.34* 1.46* 1.30*  --  1.26*  --   VANCOTROUGH  --   --   --   --  23*  --  10*    Estimated Creatinine Clearance: 78.7 mL/min (A) (by C-G formula based on SCr of 1.26 mg/dL (H)).    No Known Allergies  Antimicrobials this admission: cefepime 6/10 >>  vanco 6/10 >>   Dose adjustments this admission:  6/13 VT = 21: vancomycin 1000mg  q12h to 750mg  q12 hr  6/17 VT = 23: vancomycin 750mg  q12h to 1250mg  q24h  Microbiology results: 6/10 BCx: ngtd 3/5 MRSA neg  2/19 Bcx strep mitis/oralis   Arrie Senate, PharmD, BCPS Clinical Pharmacist [phone] Please check AMION for all [facility] Pharmacy numbers 09/06/2019

## 2019-09-06 NOTE — Progress Notes (Signed)
PROGRESS NOTE    Luke CHOVANEC  GDJ:[id number] DOB: Jul 28, 1980 DOA: 08/27/2019 PCP: Gildardo Pounds, NP    Brief Narrative: 39 year old male with type 1 diabetes IV drug abuse polysubstance abuse seizures endocarditis stage III CKD peripheral vascular disease left great toe amputation and hypothyroidism and hypertension admitted with shortness of breath. He was found to have left arm cellulitis with a deep ulcer on 08/20/2019 in the emergency room. Plastic surgery recommended surgical intervention patient left AMA on 08/21/2019. His friend brought him back to the hospital since patient had substernal chest pain and could not breathe.   Assessment & Plan:   Principal Problem:   Endocarditis of mitral valve Active Problems:   Drug abuse, IV ([name])   Tobacco abuse   CKD (chronic kidney disease) stage 3, GFR 30-59 ml/min   Uncontrolled insulin dependent type 1 diabetes mellitus ([place])   Diabetic ulcer of left foot ([name])   Hypothyroidism   Heroin abuse ([place])   Congestive heart failure (CHF) ([name])   Arm wound   Endocarditis of pulmonic valve    #1 mitral valve endocarditis-echo shows mitral valve vegetation with moderate regurgitation. Possible vegetation also on the pulmonic valve. On vancomycin and rocephin. ID following. Recommending 4 weeks of antibiotic therapy. Previous attending discussed with cardiology on 08/29/2019. It was thought that MR was not severe enough to do any surgical intervention at this time. He was seen by Dr. Kipp Brood in the past and was deemed not a good candidate for surgery.  #2 right forearm necrosis status post surgery 6/15/2021status post debridement of the right forearm and debridement of the left forearm. Plastics following.  #3 type 1 diabetes  CBG (last 3)  Recent Labs    09/05/19 2209 09/06/19 0614 09/06/19 1201  GLUCAP 226* 202* 221*         Increased lantus 24 units on 09/05/2019 continue the same.  Continue SSI.   #4 stage  IIIa CKD stable monitor on vancomycin. Creatinine 1.26  #5 left second toe ulcer continue wound care  #6 polysubstance abuse on clonidine taper.Consulted TOC to provide resources  #7 normocytic anemia stable no evidence of active bleeding  #8 hepatitis C outpatient ID follow-up  #9 history of essential hypertension-blood pressure 105/65 improved after stopping Norvasc.  Continue carvedilol and monitor closely.    #10 tobacco abuse continue nicotine patch  11 hypothyroidism on levothyroxine  #12 hyperlipidemia on Lipitor.  #13 pain control I have started him on long-acting MS Contin and Dilaudid for breakthrough pain.Titrate down as tolerated.  Titrating Dilaudid to 1 mg every 4 as needed.     Nutrition Problem: Increased nutrient needs Etiology: wound healing     Signs/Symptoms: estimated needs    Interventions: MVI, Juven, Premier Protein  Estimated body mass index is 25.09 kg/m as calculated from the following:   Height as of this encounter: 5\' 9"  (1.753 m).   Weight as of this encounter: 77.1 kg.  DVT prophylaxis:scd Code Status:full Family Communication:none Disposition Plan:Status is: Inpatient  Dispo: Patient From: Home Planned Disposition: To be determined Expected discharge date:unknown Medically stable for discharge: No patient needs to complete 4 weeks course of IV antibiotics for endocarditis inpatient not safe to discharge him home with PICC line with history of IV drug use  Consultants:Infectious disease, plastic surgery  Procedures:Status post debridement of the right and left 09/02/2019 Antimicrobials:Vancomycin and cefepime  Subjective: Patient requesting Flexeril for the back spasm.   Staff reports more drainage on the right upper extremity  Objective: Vitals:   09/05/19 2212 09/06/19 0000 09/06/19 0510 09/06/19 1202  BP: 123/73  126/83 105/65  Pulse:  (!) 102  96 86  Resp: 18  18 18   Temp: 98.8 F (37.1 C)  98.5 F (36.9 C) 98.4 F (36.9 C)  TempSrc: Oral  Oral Oral  SpO2: 90%  (!) 89% 95%  Weight:  77.1 kg    Height:        Intake/Output Summary (Last 24 hours) at 09/06/2019 1203 Last data filed at 09/05/2019 2212 Gross per 24 hour  Intake 695.72 ml  Output 375 ml  Net 320.72 ml   Filed Weights   09/04/19 0149 09/05/19 0200 09/06/19 0000  Weight: 77.5 kg 67.3 kg 77.1 kg    Examination:  General exam: Appears calm and comfortable  Respiratory system: Clear to auscultation. Respiratory effort normal. Cardiovascular system: S1 & S2 heard, RRR. No JVD, murmurs, rubs, gallops or clicks. No pedal edema. Gastrointestinal system: Abdomen is nondistended, soft and nontender. No organomegaly or masses felt. Normal bowel sounds heard. Central nervous system: Alert and oriented. No focal neurological deficits. Extremities: Both hands upper extremities are covered with wraps Skin: No rashes, lesions or ulcers Psychiatry: Judgement and insight appear normal. Mood & affect appropriate.     Data Reviewed: I have personally reviewed following labs and imaging studies  CBC: Recent Labs  Lab 08/31/19 1249 09/01/19 0451 09/02/19 0417 09/03/19 0500  WBC 16.6* 16.2* 16.2* 14.3*  HGB 9.1* 9.2* 9.1* 8.7*  HCT 28.2* 29.0* 28.7* 27.5*  MCV 80.3 81.0 82.2 83.6  PLT 519* 547* 505* 277*   Basic Metabolic Panel: Recent Labs  Lab 08/31/19 1249 09/01/19 0451 09/02/19 0417 09/03/19 0500 09/06/19 0631  NA 137 139 140 139 136  K 3.9 3.3* 3.8 3.7 3.6  CL 109 110 112* 110 105  CO2 20* 20* 21* 22 23  GLUCOSE 127* 164* 71 71 193*  BUN 15 13 16  23* 25*  CREATININE 1.45* 1.34* 1.46* 1.30* 1.26*  CALCIUM 8.6* 8.5* 8.6* 8.6* 8.5*   GFR: Estimated Creatinine Clearance: 78.7 mL/min (A) (by C-G formula based on SCr of 1.26 mg/dL (H)). Liver Function Tests: No results for input(s): AST, ALT, ALKPHOS, BILITOT, PROT, ALBUMIN in the last 168  hours. No results for input(s): LIPASE, AMYLASE in the last 168 hours. No results for input(s): AMMONIA in the last 168 hours. Coagulation Profile: No results for input(s): INR, PROTIME in the last 168 hours. Cardiac Enzymes: No results for input(s): CKTOTAL, CKMB, CKMBINDEX, TROPONINI in the last 168 hours. BNP (last 3 results) No results for input(s): PROBNP in the last 8760 hours. HbA1C: No results for input(s): HGBA1C in the last 72 hours. CBG: Recent Labs  Lab 09/05/19 0614 09/05/19 1134 09/05/19 1646 09/05/19 2209 09/06/19 0614  GLUCAP 366* 208* 137* 226* 202*   Lipid Profile: No results for input(s): CHOL, HDL, LDLCALC, TRIG, CHOLHDL, LDLDIRECT in the last 72 hours. Thyroid Function Tests: No results for input(s): TSH, T4TOTAL, FREET4, T3FREE, THYROIDAB in the last 72 hours. Anemia Panel: No results for input(s): VITAMINB12, FOLATE, FERRITIN, TIBC, IRON, RETICCTPCT in the last 72 hours. Sepsis Labs: No results for input(s): PROCALCITON, LATICACIDVEN in the last 168 hours.  Recent Results (from the past 240 hour(s))  SARS Coronavirus 2 by RT PCR (hospital order, performed in [facility] hospital lab) Nasopharyngeal Nasopharyngeal Swab     Status: None   Collection Time: 08/27/19  5:42 PM   Specimen: Nasopharyngeal Swab  Result Value Ref Range Status  SARS Coronavirus 2 NEGATIVE NEGATIVE Final    Comment: (NOTE) SARS-CoV-2 target nucleic acids are NOT DETECTED.  The SARS-CoV-2 RNA is generally detectable in upper and lower respiratory specimens during the acute phase of infection. The lowest concentration of SARS-CoV-2 viral copies this assay can detect is 250 copies / mL. A negative result does not preclude SARS-CoV-2 infection and should not be used as the sole basis for treatment or other patient management decisions.  A negative result may occur with improper specimen collection / handling, submission of specimen other than nasopharyngeal swab, presence of  viral mutation(s) within the areas targeted by this assay, and inadequate number of viral copies (<250 copies / mL). A negative result must be combined with clinical observations, patient history, and epidemiological information.  Fact Sheet for Patients:   StrictlyIdeas.no  Fact Sheet for Healthcare Providers: BankingDealers.co.za  This test is not yet approved or  cleared by the [place] FDA and has been authorized for detection and/or diagnosis of SARS-CoV-2 by FDA under an Emergency Use Authorization (EUA).  This EUA will remain in effect (meaning this test can be used) for the duration of the COVID-19 declaration under Section 564(b)(1) of the Act, 21 U.S.C. section 360bbb-3(b)(1), unless the authorization is terminated or revoked sooner.  Performed at [facility], [place] [address] [place], [place] [address]   Blood Cultures x 2 sites     Status: None   Collection Time: 08/27/19  9:36 PM   Specimen: BLOOD RIGHT WRIST  Result Value Ref Range Status   Specimen Description BLOOD RIGHT WRIST  Final   Special Requests   Final    BOTTLES DRAWN AEROBIC AND ANAEROBIC Blood Culture adequate volume   Culture   Final    NO GROWTH 5 DAYS Performed at [facility], [place] [address], [place], [place] [address]    Report Status 09/01/2019 FINAL  Final  Blood Cultures x 2 sites     Status: None   Collection Time: 08/27/19  9:46 PM   Specimen: BLOOD LEFT WRIST  Result Value Ref Range Status   Specimen Description BLOOD LEFT WRIST  Final   Special Requests   Final    BOTTLES DRAWN AEROBIC AND ANAEROBIC Blood Culture adequate volume   Culture   Final    NO GROWTH 5 DAYS Performed at [facility], [place] [address], [place], [place] [address]    Report Status 09/01/2019 FINAL  Final  Surgical pcr screen     Status: Abnormal   Collection Time: 08/31/19 12:19 PM   Specimen: Nasal Mucosa; Nasal Swab  Result Value Ref Range  Status   MRSA, PCR POSITIVE (A) NEGATIVE Final    Comment: RESULT CALLED TO, READ BACK BY AND VERIFIED WITH: Octavio Manns RN 14:30 08/31/19 ([email or web address])    Staphylococcus aureus POSITIVE (A) NEGATIVE Final    Comment: (NOTE) The Xpert SA Assay (FDA approved for NASAL specimens in patients 18 years of age and older), is one component of a comprehensive surveillance program. It is not intended to diagnose infection nor to guide or monitor treatment. Performed at [facility], [place] [address] [place],  [address]          Radiology Studies: No results found.      Scheduled Meds: . atorvastatin  20 mg Oral Daily  . carvedilol  12.5 mg Oral BID WC  . docusate sodium  100 mg Oral BID  . DULoxetine  30 mg Oral Daily  .  enoxaparin (LOVENOX) injection  40 mg Subcutaneous Q24H  . gabapentin  400 mg Oral TID  . insulin aspart  0-15 Units Subcutaneous TID WC  . insulin aspart  0-5 Units Subcutaneous QHS  . insulin glargine  24 Units Subcutaneous QHS  . levothyroxine  50 mcg Oral Q0600  . morphine  30 mg Oral Q12H  . multivitamin with minerals  1 tablet Oral Daily  . nicotine  14 mg Transdermal Daily  . nutrition supplement (JUVEN)  1 packet Oral BID BM  . pantoprazole  40 mg Oral Daily  . Ensure Max Protein  11 oz Oral QHS  . sertraline  50 mg Oral Daily  . sodium chloride flush  10-40 mL Intracatheter Q12H  . sodium chloride flush  3 mL Intravenous Q12H  . traZODone  50 mg Oral QHS   Continuous Infusions: . sodium chloride Stopped (09/03/19 2303)  . cefTRIAXone (ROCEPHIN)  IV 2 g (09/05/19 1334)  . lactated ringers    . vancomycin 1,250 mg (09/05/19 1703)     LOS: 10 days     Georgette Shell, MD  09/06/2019, 12:03 PM

## 2019-09-07 LAB — GLUCOSE, CAPILLARY
Glucose-Capillary: 179 mg/dL — ABNORMAL HIGH (ref 70–99)
Glucose-Capillary: 253 mg/dL — ABNORMAL HIGH (ref 70–99)
Glucose-Capillary: 289 mg/dL — ABNORMAL HIGH (ref 70–99)
Glucose-Capillary: 179 mg/dL — ABNORMAL HIGH (ref 70–99)

## 2019-09-07 MED ORDER — CARVEDILOL 6.25 MG PO TABS
6.2500 mg | ORAL_TABLET | Freq: Two times a day (BID) | ORAL | Status: DC
  Administered 2019-09-07 – 2019-09-10 (×7): 6.25 mg via ORAL
  Filled 2019-09-07 (×7): qty 1

## 2019-09-07 NOTE — Progress Notes (Signed)
Inpatient Diabetes Program Recommendations  AACE/ADA: New Consensus Statement on Inpatient Glycemic Control (2015)  Target Ranges:  Prepandial:   less than 140 mg/dL      Peak postprandial:   less than 180 mg/dL (1-2 hours)      Critically ill patients:  140 - 180 mg/dL   Lab Results  Component Value Date   GLUCAP 253 (H) 09/07/2019   HGBA1C 10.8 (H) 08/27/2019    Review of Glycemic Control Results for PAVAN, BRING (MRN [id number]) as of 09/07/2019 13:51  Ref. Range 09/06/2019 17:49 09/06/2019 21:10 09/07/2019 06:05 09/07/2019 11:15  Glucose-Capillary Latest Ref Range: 70 - 99 mg/dL 331 (H) 176 (H) 179 (H) 253 (H)   Diabetes history:DM type 1 Outpatient Diabetes medications:Lantus 40 units QD, Humalog 4-20 units tid Current orders for Inpatient glycemic control: Lantus 24 units QHS, Novolog 0-15 units TID, Novolog 0-5 units QHS  Inpatient Diabetes Program Recommendations:    Consider adding Novolog 2 units TID (assuming patient is consuming >50% of meal).   Thanks, Bronson Curb, MSN, RNC-OB Diabetes Coordinator [phone] (8a-5p)

## 2019-09-07 NOTE — Progress Notes (Signed)
PROGRESS NOTE    Luke [name]  IZT:[id number] DOB: [birth year] DOA: 08/27/2019 PCP: Gildardo Pounds, NP    Brief Narrative: 39 year old male with type 1 diabetes IV drug abuse polysubstance abuse seizures endocarditis stage III CKD peripheral vascular disease left great toe amputation and hypothyroidism and hypertension admitted with shortness of breath. He was found to have left arm cellulitis with a deep ulcer on 08/20/2019 in the emergency room. Plastic surgery recommended surgical intervention patient left AMA on 08/21/2019. His friend brought him back to the hospital since patient had substernal chest pain and could not breathe.   Assessment & Plan:   Principal Problem:   Endocarditis of mitral valve Active Problems:   Drug abuse, IV ([name])   Tobacco abuse   CKD (chronic kidney disease) stage 3, GFR 30-59 ml/min   Uncontrolled insulin dependent type 1 diabetes mellitus ([place])   Diabetic ulcer of left foot ([name])   Hypothyroidism   Heroin abuse ([place])   Congestive heart failure (CHF) ([name])   Arm wound   Endocarditis of pulmonic valve      #1 mitral valve endocarditis-echo shows mitral valve vegetation with moderate regurgitation. Possible vegetation also on the pulmonic valve. On vancomycin and rocephin. ID following. Recommending 4 weeks of antibiotic therapy. Previous attending discussed with cardiology on 08/29/2019. It was thought that MR was not severe enough to do any surgical intervention at this time. He was seen by Dr. Kipp Brood in the past and was deemed not a good candidate for surgery.  #2 right forearm necrosis status post surgery 09/01/2019 status post debridement of the right forearm and debridement of the left forearm.  Plastics following.  #3 type 1 diabetes   CBG (last 3)  Recent Labs    09/06/19 2110 09/07/19 0605 09/07/19 1115  GLUCAP 176* 179* 253*  Increased lantus 24 units on 09/05/2019 continue the same.   #4 stage IIIa CKD stable monitor on  vancomycin. Creatinine 1.26  #5 left second toe ulcer continue wound care  #6 polysubstance abuse on clonidine taper.Consulted TOC to provide resources  #7 normocytic anemia stable no evidence of active bleeding  #8 hepatitis C outpatient ID follow-up  #9 history of essential hypertension-blood pressure 116/77.  Continue to hold Norvasc, decrease Coreg to 6.25 mg twice daily  #10 tobacco abuse continue nicotine patch  #11 hypothyroidism on levothyroxine                                    #12 hyperlipidemia on Lipitor.                                     #13 pain control I have started him on long-acting MS Contin and                                            Dilaudid for breakthrough pain.Titrate down as tolerated.                                                   Titrating Dilaudid to 1 mg every 4 as needed.  Nutrition Problem: Increased nutrient needs Etiology: wound healing     Signs/Symptoms: estimated needs    Interventions: MVI, Juven, Premier Protein  Estimated body mass index is 25 kg/m as calculated from the following:   Height as of this encounter: 5\' 9"  (1.753 m).   Weight as of this encounter: 76.8 kg.  DVT prophylaxis:scd Code Status:full Family Communication:none Disposition Plan:Status is: Inpatient  Dispo: Patient From: Home Planned Disposition: To be determined Expected discharge date:unknown Medically stable for discharge: No patient needs to complete 4 weeks course of IV antibiotics for endocarditis inpatient not safe to discharge him home with PICC line with history of IV drug use  Consultants:Infectious disease, plastic surgery  Procedures:Status post debridement of the right and left 09/02/2019 Antimicrobials:Vancomycin and cefepime  Subjective: Patient requesting Flexeril for the back spasm.   Staff reports more drainage on the right upper  extremity  Objective: Vitals:   09/06/19 1202 09/06/19 1958 09/07/19 0603 09/07/19 1252  BP: 105/65 125/77 108/68 116/77  Pulse: 86 96 94 93  Resp: 18 18 18 18   Temp: 98.4 F (36.9 C) 98.4 F (36.9 C) 98.6 F (37 C) 98.4 F (36.9 C)  TempSrc: Oral Oral  Oral  SpO2: 95% 97% 93% 97%  Weight:   76.8 kg   Height:        Intake/Output Summary (Last 24 hours) at 09/07/2019 1344 Last data filed at 09/07/2019 0900 Gross per 24 hour  Intake 1327.41 ml  Output 1900 ml  Net -572.59 ml   Filed Weights   09/05/19 0200 09/06/19 0000 09/07/19 0603  Weight: 67.3 kg 77.1 kg 76.8 kg    Examination:  General exam: Appears calm and comfortable  Respiratory system: Clear to auscultation. Respiratory effort normal. Cardiovascular system: S1 & S2 heard, RRR. No JVD, murmurs, rubs, gallops or clicks. No pedal edema. Gastrointestinal system: Abdomen is nondistended, soft and nontender. No organomegaly or masses felt. Normal bowel sounds heard. Central nervous system: Alert and oriented. No focal neurological deficits. Extremities: Both hands upper extremities are covered with wraps Skin: No rashes, lesions or ulcers Psychiatry: Judgement and insight appear normal. Mood & affect appropriate.     Data Reviewed: I have personally reviewed following labs and imaging studies  CBC: Recent Labs  Lab 09/01/19 0451 09/02/19 0417 09/03/19 0500  WBC 16.2* 16.2* 14.3*  HGB 9.2* 9.1* 8.7*  HCT 29.0* 28.7* 27.5*  MCV 81.0 82.2 83.6  PLT 547* 505* 270*   Basic Metabolic Panel: Recent Labs  Lab 09/01/19 0451 09/02/19 0417 09/03/19 0500 09/06/19 0631  NA 139 140 139 136  K 3.3* 3.8 3.7 3.6  CL 110 112* 110 105  CO2 20* 21* 22 23  GLUCOSE 164* 71 71 193*  BUN 13 16 23* 25*  CREATININE 1.34* 1.46* 1.30* 1.26*  CALCIUM 8.5* 8.6* 8.6* 8.5*   GFR: Estimated Creatinine Clearance: 78.7 mL/min (A) (by C-G formula based on SCr of 1.26 mg/dL (H)). Liver Function Tests: No results for input(s):  AST, ALT, ALKPHOS, BILITOT, PROT, ALBUMIN in the last 168 hours. No results for input(s): LIPASE, AMYLASE in the last 168 hours. No results for input(s): AMMONIA in the last 168 hours. Coagulation Profile: No results for input(s): INR, PROTIME in the last 168 hours. Cardiac Enzymes: No results for input(s): CKTOTAL, CKMB, CKMBINDEX, TROPONINI in the last 168 hours. BNP (last 3 results) No results for input(s): PROBNP in the last 8760 hours. HbA1C: No results for input(s): HGBA1C in the last 72 hours. CBG: Recent Labs  Lab 09/06/19 1201 09/06/19 1749 09/06/19 2110 09/07/19 0605 09/07/19 1115  GLUCAP 221* 331* 176* 179* 253*   Lipid Profile: No results for input(s): CHOL, HDL, LDLCALC, TRIG, CHOLHDL, LDLDIRECT in the last 72 hours. Thyroid Function Tests: No results for input(s): TSH, T4TOTAL, FREET4, T3FREE, THYROIDAB in the last 72 hours. Anemia Panel: No results for input(s): VITAMINB12, FOLATE, FERRITIN, TIBC, IRON, RETICCTPCT in the last 72 hours. Sepsis Labs: No results for input(s): PROCALCITON, LATICACIDVEN in the last 168 hours.  Recent Results (from the past 240 hour(s))  Surgical pcr screen     Status: Abnormal   Collection Time: 08/31/19 12:19 PM   Specimen: Nasal Mucosa; Nasal Swab  Result Value Ref Range Status   MRSA, PCR POSITIVE (A) NEGATIVE Final    Comment: RESULT CALLED TO, READ BACK BY AND VERIFIED WITH: Octavio Manns RN 14:30 08/31/19 ([email or web address])    Staphylococcus aureus POSITIVE (A) NEGATIVE Final    Comment: (NOTE) The Xpert SA Assay (FDA approved for NASAL specimens in patients 75 years of age and older), is one component of a comprehensive surveillance program. It is not intended to diagnose infection nor to guide or monitor treatment. Performed at [facility], [place] [address] [place], [place] [address]          Radiology Studies: No results found.      Scheduled Meds: . atorvastatin  20 mg Oral Daily  . carvedilol  12.5 mg Oral  BID WC  . docusate sodium  100 mg Oral BID  . DULoxetine  30 mg Oral Daily  . enoxaparin (LOVENOX) injection  40 mg Subcutaneous Q24H  . gabapentin  400 mg Oral TID  . insulin aspart  0-15 Units Subcutaneous TID WC  . insulin aspart  0-5 Units Subcutaneous QHS  . insulin glargine  24 Units Subcutaneous QHS  . levothyroxine  50 mcg Oral Q0600  . morphine  30 mg Oral Q12H  . multivitamin with minerals  1 tablet Oral Daily  . nicotine  14 mg Transdermal Daily  . nutrition supplement (JUVEN)  1 packet Oral BID BM  . pantoprazole  40 mg Oral Daily  . Ensure Max Protein  11 oz Oral QHS  . sertraline  50 mg Oral Daily  . sodium chloride flush  10-40 mL Intracatheter Q12H  . sodium chloride flush  3 mL Intravenous Q12H  . traZODone  50 mg Oral QHS   Continuous Infusions: . sodium chloride 10 mL/hr at 09/07/19 0300  . cefTRIAXone (ROCEPHIN)  IV 2 g (09/06/19 1319)  . lactated ringers    . vancomycin Stopped (09/06/19 2033)     LOS: 11 days     Georgette Shell, MD  09/07/2019, 1:44 PM

## 2019-09-07 NOTE — Progress Notes (Signed)
Patient ID: Luke [name], male   DOB: 1980/11/01, 39 y.o.   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  PLAN: 1. Continue vancomycin and ceftriaxone  Principal Problem:   Endocarditis of mitral valve Active Problems:   Arm wound   Endocarditis of pulmonic valve   Drug abuse, IV ([name])   Tobacco abuse   CKD (chronic kidney disease) stage 3, GFR 30-59 ml/min   Uncontrolled insulin dependent type 1 diabetes mellitus ([name])   Diabetic ulcer of left foot ([name])   Hypothyroidism   Heroin abuse ([name])   Congestive heart failure (CHF) ([name])   Scheduled Meds: . atorvastatin  20 mg Oral Daily  . carvedilol  12.5 mg Oral BID WC  . docusate sodium  100 mg Oral BID  . DULoxetine  30 mg Oral Daily  . enoxaparin (LOVENOX) injection  40 mg Subcutaneous Q24H  . gabapentin  400 mg Oral TID  . insulin aspart  0-15 Units Subcutaneous TID WC  . insulin aspart  0-5 Units Subcutaneous QHS  . insulin glargine  24 Units Subcutaneous QHS  . levothyroxine  50 mcg Oral Q0600  . morphine  30 mg Oral Q12H  . multivitamin with minerals  1 tablet Oral Daily  . nicotine  14 mg Transdermal Daily  . nutrition supplement (JUVEN)  1 packet Oral BID BM  . pantoprazole  40 mg Oral Daily  . Ensure Max Protein  11 oz Oral QHS  . sertraline  50 mg Oral Daily  . sodium chloride flush  10-40 mL Intracatheter Q12H  . sodium chloride flush  3 mL Intravenous Q12H  . traZODone  50 mg Oral QHS   Continuous Infusions: . sodium chloride 10 mL/hr at 09/07/19 0300  . cefTRIAXone (ROCEPHIN)  IV 2 g (09/06/19 1319)  . lactated ringers    . vancomycin Stopped (09/06/19 2033)   PRN Meds:.sodium chloride, acetaminophen **OR** acetaminophen, cyclobenzaprine, hydrALAZINE, HYDROmorphone (DILAUDID) injection, melatonin, ondansetron  **OR** ondansetron (ZOFRAN) IV, polyethylene glycol, sodium chloride flush   SUBJECTIVE: He says that his forearm wounds are looking better.  Review of Systems: Review of Systems  Constitutional: Negative for chills, diaphoresis and fever.    No Known Allergies  OBJECTIVE: Vitals:   09/06/19 0510 09/06/19 1202 09/06/19 1958 09/07/19 0603  BP: 126/83 105/65 125/77 108/68  Pulse: 96 86 96 94  Resp: 18 18 18 18   Temp: 98.5 F (36.9 C) 98.4 F (36.9 C) 98.4 F (36.9 C) 98.6 F (37 C)  TempSrc: Oral Oral Oral   SpO2: (!) 89% 95% 97% 93%  Weight:    76.8 kg  Height:       Body mass index is 25 kg/m.  Physical Exam Constitutional:      Comments: He is resting quietly in bed.  Cardiovascular:     Rate and Rhythm: Normal rate and regular rhythm.     Heart sounds: No murmur heard.   Pulmonary:     Effort: Pulmonary effort is normal.     Breath sounds: Normal breath sounds.  Musculoskeletal:     Comments: Ace wraps on both forearms.  Psychiatric:        Mood and Affect: Mood normal.     Lab Results Lab Results  Component Value  Date   WBC 14.3 (H) 09/03/2019   HGB 8.7 (L) 09/03/2019   HCT 27.5 (L) 09/03/2019   MCV 83.6 09/03/2019   PLT 458 (H) 09/03/2019    Lab Results  Component Value Date   CREATININE 1.26 (H) 09/06/2019   BUN 25 (H) 09/06/2019   NA 136 09/06/2019   K 3.6 09/06/2019   CL 105 09/06/2019   CO2 23 09/06/2019    Lab Results  Component Value Date   ALT 12 08/29/2019   AST 13 (L) 08/29/2019   ALKPHOS 96 08/29/2019   BILITOT 0.4 08/29/2019     Microbiology: Recent Results (from the past 240 hour(s))  Surgical pcr screen     Status: Abnormal   Collection Time: 08/31/19 12:19 PM   Specimen: Nasal Mucosa; Nasal Swab  Result Value Ref Range Status   MRSA, PCR POSITIVE (A) NEGATIVE Final    Comment: RESULT CALLED TO, READ BACK BY AND VERIFIED WITH: Luke Manns RN 14:30 08/31/19 ([email or web address])    Staphylococcus aureus POSITIVE (A) NEGATIVE Final     Comment: (NOTE) The Xpert SA Assay (FDA approved for NASAL specimens in patients 61 years of age and older), is one component of a comprehensive surveillance program. It is not intended to diagnose infection nor to guide or monitor treatment. Performed at [facility], [place] [address] [place],  [address]     Luke [name], Luke [name] [facility] [phone] pager   [phone] cell 09/07/2019, 11:29 AM

## 2019-09-08 ENCOUNTER — Ambulatory Visit: Payer: Commercial Managed Care - PPO | Admitting: Nurse Practitioner

## 2019-09-08 LAB — GLUCOSE, CAPILLARY
Glucose-Capillary: 287 mg/dL — ABNORMAL HIGH (ref 70–99)
Glucose-Capillary: 253 mg/dL — ABNORMAL HIGH (ref 70–99)
Glucose-Capillary: 189 mg/dL — ABNORMAL HIGH (ref 70–99)
Glucose-Capillary: 309 mg/dL — ABNORMAL HIGH (ref 70–99)

## 2019-09-08 MED ORDER — INSULIN ASPART 100 UNIT/ML ~~LOC~~ SOLN
3.0000 [IU] | Freq: Three times a day (TID) | SUBCUTANEOUS | Status: DC
  Administered 2019-09-08 – 2019-09-10 (×6): 3 [IU] via SUBCUTANEOUS

## 2019-09-08 MED ORDER — INSULIN GLARGINE 100 UNIT/ML ~~LOC~~ SOLN
26.0000 [IU] | Freq: Every day | SUBCUTANEOUS | Status: DC
  Administered 2019-09-08 – 2019-09-09 (×2): 26 [IU] via SUBCUTANEOUS
  Filled 2019-09-08 (×3): qty 0.26

## 2019-09-08 NOTE — Progress Notes (Addendum)
7 Days Post-Op  Subjective: Patient is a 39 year old male who underwent bilateral forearm debridements with places of prime matrix AG on 09/01/2019 with Dr. Claudia Desanctis.  Past medical history significant for IVDA/polysubstance abuse, insulin-dependent diabetes, hypothyroidism, HTN, endocarditis, stage III CKD, Hx great toe amputation.  Today he is sitting in a chair having just finished breakfast.  Reports he has some pain with his arms but other than that has no complaints related to the arms.  Denies fever, nausea/vomiting, chest pain.  Reports he had some shortness of breath last night but it is still improved since his admission.  Dressing change completed today.  Mepitel mesh in place.  No signs of infection, redness, seroma/hematoma.  Moderate expected drainage on dressing.  Full range of motion of bilateral arms, sensation intact.  Objective: Vital signs in last 24 hours: Temp:  [98.4 F (36.9 C)-99.4 F (37.4 C)] 98.9 F (37.2 C) (06/22 0510) Pulse Rate:  [91-105] 96 (06/22 0548) Resp:  [18-20] 19 (06/22 0510) BP: (116-144)/(67-93) 144/93 (06/22 0510) SpO2:  [89 %-98 %] 93 % (06/22 0511) Weight:  [76.9 kg] 76.9 kg (06/22 0510) Last BM Date: 09/07/19  Intake/Output from previous day: 06/21 0701 - 06/22 0700 In: 480 [P.O.:480] Out: 2900 [Urine:2900] Intake/Output this shift: Total I/O In: -  Out: 400 [Urine:400]  General appearance: alert, cooperative and no distress Head: Normocephalic, without obvious abnormality, atraumatic Eyes: EOMs intact Resp: nonlabored Extremities: Bilateral arm wounds are healing well.  Mepitel mesh in place.  No signs of infection, redness, seroma/hematoma.  Moderate expected drainage on gauze.  Motor and sensation of bilateral arms intact.         Lab Results:  CBC    Component Value Date/Time   WBC 14.3 (H) 09/03/2019 0500   RBC 3.29 (L) 09/03/2019 0500   HGB 8.7 (L) 09/03/2019 0500   HGB 12.0 (L) 04/19/2014 0203   HCT 27.5 (L) 09/03/2019 0500    HCT 37.9 (L) 04/19/2014 0203   PLT 458 (H) 09/03/2019 0500   PLT 383 04/19/2014 0203   MCV 83.6 09/03/2019 0500   MCV 87 04/19/2014 0203   MCH 26.4 09/03/2019 0500   MCHC 31.6 09/03/2019 0500   RDW 14.9 09/03/2019 0500   RDW 14.3 04/19/2014 0203   LYMPHSABS 2.1 08/27/2019 1415   LYMPHSABS 5.2 (H) 04/08/2014 0959   MONOABS 0.7 08/27/2019 1415   MONOABS 1.3 (H) 04/08/2014 0959   EOSABS 0.2 08/27/2019 1415   EOSABS 0.4 04/08/2014 0959   BASOSABS 0.1 08/27/2019 1415   BASOSABS 0.2 (H) 04/08/2014 0959   BMET Recent Labs    09/06/19 0631  NA 136  K 3.6  CL 105  CO2 23  GLUCOSE 193*  BUN 25*  CREATININE 1.26*  CALCIUM 8.5*   PT/INR No results for input(s): LABPROT, INR in the last 72 hours. ABG No results for input(s): PHART, HCO3 in the last 72 hours.  Invalid input(s): PCO2, PO2  Studies/Results: No results found.  Anti-infectives: Anti-infectives (From admission, onward)   Start     Dose/Rate Route Frequency Ordered Stop   09/06/19 1830  vancomycin (VANCOREADY) IVPB 1750 mg/350 mL     Discontinue     1,750 mg 175 mL/hr over 120 Minutes Intravenous Every 24 hours 09/06/19 1737     09/04/19 1400  cefTRIAXone (ROCEPHIN) 2 g in sodium chloride 0.9 % 100 mL IVPB     Discontinue     2 g 200 mL/hr over 30 Minutes Intravenous Every 24 hours 09/04/19 1123  09/04/19 1000  vancomycin (VANCOREADY) IVPB 1250 mg/250 mL  Status:  Discontinued        1,250 mg 166.7 mL/hr over 90 Minutes Intravenous Daily 09/03/19 0614 09/03/19 1225   09/03/19 1800  vancomycin (VANCOREADY) IVPB 1250 mg/250 mL  Status:  Discontinued        1,250 mg 166.7 mL/hr over 90 Minutes Intravenous Daily 09/03/19 1225 09/06/19 1737   08/30/19 1800  vancomycin (VANCOREADY) IVPB 750 mg/150 mL  Status:  Discontinued        750 mg 150 mL/hr over 60 Minutes Intravenous Every 12 hours 08/30/19 0731 09/03/19 0614   08/28/19 0630  vancomycin (VANCOCIN) IVPB 1000 mg/200 mL premix  Status:  Discontinued         1,000 mg 200 mL/hr over 60 Minutes Intravenous Every 12 hours 08/27/19 1742 08/30/19 0731   08/27/19 1800  vancomycin (VANCOREADY) IVPB 1500 mg/300 mL        1,500 mg 150 mL/hr over 120 Minutes Intravenous  Once 08/27/19 1742 08/27/19 2230   08/27/19 1800  ceFEPIme (MAXIPIME) 2 g in sodium chloride 0.9 % 100 mL IVPB  Status:  Discontinued        2 g 200 mL/hr over 30 Minutes Intravenous Every 8 hours 08/27/19 1742 09/04/19 1123      Assessment/Plan: s/p Procedure(s): IRRIGATION AND DEBRIDEMENT FOREARM WOUNDS APPLICATION OF MESHED [place] AG OF EXTREMITY Patient is doing well from debridement of bilateral arm wounds with placement of prime matrix.  No signs of infection.   Continue daily dressing changes of surgical lube applied to Mepitel mesh (white mesh), cover with gauze wrap with Kerlix and Ace wrap.  Do not remove Mepitel mesh.  Dressing change completed for today.  Stable for discharge from plastic standpoint.  Will follow while in the hospital and manage as outpatient upon discharge.  Current plan is to remove Mepitel mesh in 2 weeks either at bedside or in the clinic.  Continue to control blood sugar levels for optimal wound healing.   LOS: 12 days    Threasa Heads, [place] 09/08/2019

## 2019-09-08 NOTE — Progress Notes (Signed)
PROGRESS NOTE    Luke [name]  AUQ:[id number] DOB: 11-Aug-1980 DOA: 08/27/2019 PCP: Gildardo Pounds, NP    Brief Narrative: 39 year old male with type 1 diabetes IV drug abuse polysubstance abuse seizures endocarditis stage III CKD peripheral vascular disease left great toe amputation and hypothyroidism and hypertension admitted with shortness of breath. He was found to have left arm cellulitis with a deep ulcer on 08/20/2019 in the emergency room. Plastic surgery recommended surgical intervention patient left AMA on 08/21/2019. His friend brought him back to the hospital since patient had substernal chest pain and could not breathe.   Assessment & Plan:   Principal Problem:   Endocarditis of mitral valve Active Problems:   Drug abuse, IV ([name])   Tobacco abuse   CKD (chronic kidney disease) stage 3, GFR 30-59 ml/min   Uncontrolled insulin dependent type 1 diabetes mellitus ([place])   Diabetic ulcer of left foot ([name])   Hypothyroidism   Heroin abuse ([place])   Congestive heart failure (CHF) ([name])   Arm wound   Endocarditis of pulmonic valve      #1  Endocarditis mitral and pulmonary valve endocarditis.   On vancomycin and rocephin. ID following. Recommending 4 weeks of antibiotic therapy. Previous attending discussed with cardiology on 08/29/2019. It was thought that MR was not severe enough to do any surgical intervention at this time. He was seen by Dr. Kipp Brood in the past and was deemed not a good candidate for surgery.  #2 right forearm necrosis status post surgery 09/01/2019 status post debridement of the right forearm and debridement of the left forearm.  Plastics following.  Continue daily dressing changes.  They plan to remove Mepitel mesh in 2 weeks.  #3 type 1 diabetes Increased lantus 26 units on 09/08/2019.  And NovoLog 3 units 3 times daily. CBG (last 3)  Recent Labs    09/07/19 2127 09/08/19 0613 09/08/19 1117  GLUCAP 179* 287* 253*     #4 stage IIIa CKD  creatinine stable on vancomycin monitor renal functions daily.  #5 polysubstance abuse consulted case manager to provide resources on discharge.  #6 normocytic anemia with no evidence of active bleed  #7 history of essential hypertension Norvasc was stopped and Coreg was decreased to 6.25 mg twice daily on 09/07/2019.  Blood pressure 131/76.  #8 tobacco abuse continue nicotine patch  #9 hypothyroidism continue Synthroid  #10 pain control Taper narcotics as tolerated   Nutrition Problem: Increased nutrient needs Etiology: wound healing     Signs/Symptoms: estimated needs    Interventions: MVI, Juven, Premier Protein  Estimated body mass index is 25.05 kg/m as calculated from the following:   Height as of this encounter: 5\' 9"  (1.753 m).   Weight as of this encounter: 76.9 kg.  DVT prophylaxis:scd Code Status:full Family Communication:none Disposition Plan:Status is: Inpatient  Dispo: Patient From: Home Planned Disposition: To be determined Expected discharge date:unknown Medically stable for discharge: No patient needs to complete 4 weeks course of IV antibiotics for endocarditis inpatient not safe to discharge him home with PICC line with history of IV drug use  Consultants:Infectious disease, plastic surgery  Procedures:Status post debridement of the right and left 09/02/2019 Antimicrobials:Vancomycin and cefepime  Subjective: Patient requesting Flexeril for the back spasm.   Staff reports more drainage on the right upper extremity  Objective: Vitals:   09/08/19 0510 09/08/19 0511 09/08/19 0548 09/08/19 1118  BP: (!) 144/93   131/76  Pulse: (!) 105 (!) 105 96 97  Resp: 19  20  Temp: 98.9 F (37.2 C)   98.3 F (36.8 C)  TempSrc: Oral   Oral  SpO2: (!) 89% 93%  99%  Weight: 76.9 kg     Height:        Intake/Output Summary (Last 24 hours) at 09/08/2019 1309 Last data filed at 09/08/2019  1000 Gross per 24 hour  Intake 240 ml  Output 2500 ml  Net -2260 ml   Filed Weights   09/06/19 0000 09/07/19 0603 09/08/19 0510  Weight: 77.1 kg 76.8 kg 76.9 kg    Examination:  General exam: Appears calm and comfortable  Respiratory system: Clear to auscultation. Respiratory effort normal. Cardiovascular system: S1 & S2 heard, RRR. No JVD, systolic murmurs, rubs, gallops or clicks. No pedal edema. Gastrointestinal system: Abdomen is nondistended, soft and nontender. No organomegaly or masses felt. Normal bowel sounds heard. Central nervous system: Alert and oriented. No focal neurological deficits. Extremities: Both hands upper extremities are covered with wraps Skin: No rashes, lesions or ulcers Psychiatry: Judgement and insight appear normal. Mood & affect appropriate.     Data Reviewed: I have personally reviewed following labs and imaging studies  CBC: Recent Labs  Lab 09/02/19 0417 09/03/19 0500  WBC 16.2* 14.3*  HGB 9.1* 8.7*  HCT 28.7* 27.5*  MCV 82.2 83.6  PLT 505* 829*   Basic Metabolic Panel: Recent Labs  Lab 09/02/19 0417 09/03/19 0500 09/06/19 0631  NA 140 139 136  K 3.8 3.7 3.6  CL 112* 110 105  CO2 21* 22 23  GLUCOSE 71 71 193*  BUN 16 23* 25*  CREATININE 1.46* 1.30* 1.26*  CALCIUM 8.6* 8.6* 8.5*   GFR: Estimated Creatinine Clearance: 78.7 mL/min (A) (by C-G formula based on SCr of 1.26 mg/dL (H)). Liver Function Tests: No results for input(s): AST, ALT, ALKPHOS, BILITOT, PROT, ALBUMIN in the last 168 hours. No results for input(s): LIPASE, AMYLASE in the last 168 hours. No results for input(s): AMMONIA in the last 168 hours. Coagulation Profile: No results for input(s): INR, PROTIME in the last 168 hours. Cardiac Enzymes: No results for input(s): CKTOTAL, CKMB, CKMBINDEX, TROPONINI in the last 168 hours. BNP (last 3 results) No results for input(s): PROBNP in the last 8760 hours. HbA1C: No results for input(s): HGBA1C in the last 72  hours. CBG: Recent Labs  Lab 09/07/19 1115 09/07/19 1622 09/07/19 2127 09/08/19 0613 09/08/19 1117  GLUCAP 253* 289* 179* 287* 253*   Lipid Profile: No results for input(s): CHOL, HDL, LDLCALC, TRIG, CHOLHDL, LDLDIRECT in the last 72 hours. Thyroid Function Tests: No results for input(s): TSH, T4TOTAL, FREET4, T3FREE, THYROIDAB in the last 72 hours. Anemia Panel: No results for input(s): VITAMINB12, FOLATE, FERRITIN, TIBC, IRON, RETICCTPCT in the last 72 hours. Sepsis Labs: No results for input(s): PROCALCITON, LATICACIDVEN in the last 168 hours.  Recent Results (from the past 240 hour(s))  Surgical pcr screen     Status: Abnormal   Collection Time: 08/31/19 12:19 PM   Specimen: Nasal Mucosa; Nasal Swab  Result Value Ref Range Status   MRSA, PCR POSITIVE (A) NEGATIVE Final    Comment: RESULT CALLED TO, READ BACK BY AND VERIFIED WITH: Octavio Manns RN 14:30 08/31/19 ([email or web address])    Staphylococcus aureus POSITIVE (A) NEGATIVE Final    Comment: (NOTE) The Xpert SA Assay (FDA approved for NASAL specimens in patients 81 years of age and older), is one component of a comprehensive surveillance program. It is not intended to diagnose infection nor to guide or monitor treatment.  Performed at [facility], [place] [address], [place], [place] [address]          Radiology Studies: No results found.      Scheduled Meds: . atorvastatin  20 mg Oral Daily  . carvedilol  6.25 mg Oral BID WC  . docusate sodium  100 mg Oral BID  . DULoxetine  30 mg Oral Daily  . enoxaparin (LOVENOX) injection  40 mg Subcutaneous Q24H  . gabapentin  400 mg Oral TID  . insulin aspart  0-15 Units Subcutaneous TID WC  . insulin aspart  0-5 Units Subcutaneous QHS  . insulin glargine  24 Units Subcutaneous QHS  . levothyroxine  50 mcg Oral Q0600  . morphine  30 mg Oral Q12H  . multivitamin with minerals  1 tablet Oral Daily  . nicotine  14 mg Transdermal Daily  . pantoprazole  40 mg Oral Daily   . Ensure Max Protein  11 oz Oral QHS  . sertraline  50 mg Oral Daily  . sodium chloride flush  10-40 mL Intracatheter Q12H  . sodium chloride flush  3 mL Intravenous Q12H  . traZODone  50 mg Oral QHS   Continuous Infusions: . sodium chloride 10 mL/hr at 09/07/19 0300  . cefTRIAXone (ROCEPHIN)  IV 2 g (09/08/19 1256)  . lactated ringers 10 mL/hr at 09/07/19 2258  . vancomycin 1,750 mg (09/07/19 1632)     LOS: 12 days     Georgette Shell, MD  09/08/2019, 1:09 PM

## 2019-09-08 NOTE — Progress Notes (Signed)
Inpatient Diabetes Program Recommendations  AACE/ADA: New Consensus Statement on Inpatient Glycemic Control (2015)  Target Ranges:  Prepandial:   less than 140 mg/dL      Peak postprandial:   less than 180 mg/dL (1-2 hours)      Critically ill patients:  140 - 180 mg/dL   Lab Results  Component Value Date   GLUCAP 287 (H) 09/08/2019   HGBA1C 10.8 (H) 08/27/2019    Review of Glycemic Control Results for Luke [name], Luke [name] (MRN [id number]) as of 09/08/2019 09:47  Ref. Range 09/07/2019 11:15 09/07/2019 16:22 09/07/2019 21:27 09/08/2019 06:13  Glucose-Capillary Latest Ref Range: 70 - 99 mg/dL 253 (H) 289 (H) 179 (H) 287 (H)   Diabetes history:DM type 1 Outpatient Diabetes medications:Lantus 40 units QD, Humalog 4-20 units tid Current orders for Inpatient glycemic control: Lantus 24 units QHS, Novolog 0-15 units TID, Novolog 0-5 units QHS  Inpatient Diabetes Program Recommendations:    Consider adding Novolog 3 units TID (assuming patient is consuming >50% of meal) and increasing Lantus 26 units QHS.   Thanks, Bronson Curb, MSN, RNC-OB Diabetes Coordinator [phone] (8a-5p)

## 2019-09-08 NOTE — Progress Notes (Signed)
Nutrition Follow-up  DOCUMENTATION CODES:   Not applicable  INTERVENTION:   -D/c Juven due to poor acceptance -Continue Ensure Max po daily, each supplement provides 150 kcal and 30 grams of protein -Continue MVI with minerals daily -Continue Magic cup TID with meals, each supplement provides 290 kcal and 9 grams of protein  NUTRITION DIAGNOSIS:   Increased nutrient needs related to wound healing as evidenced by estimated needs.  Ongoing  GOAL:   Patient will meet greater than or equal to 90% of their needs  Progressing   MONITOR:   PO intake, Supplement acceptance, Labs, Weight trends, Skin, I & O's  REASON FOR ASSESSMENT:   Consult Wound healing  ASSESSMENT:   Luke [name] is a 39 y.o. male with medical history significant of type 1 DM; IVDA/polysubstance abuse; seizures; PVD s/p L great toe amputation; hypothyroidism; HTN; endocarditis; and stage 3 CKD presenting with SOB.  6/15- PROCEDURE:1. Debridement right forearm wound 9x6 cm including skin, subcutaneous tissue, and fascia with placement of primatrix AG mesh 2. Debridement of left forearm wound 6.5x4 cm including skin, subcutaneous tissue, and fascia with placement of primatrix AG mesh  Reviewed I/O's: -2.4 L x 24 hours and -2.9 L since admission  UOP: 2.9 L x 24 hours  Attempted to reach pt via phone, however, no answer.   Intake has improved since last visit. Noted meal completion 50-100%. Pt is taking Ensure Max supplements, however, has been refusing Juven.   Plan continued inpatient stay until completion of IV antibiotics.   Labs reviewed: CBGS: [id number] (inpatient orders for glycemic control are 0-5 units insulin aspart q HS, 24 units insulin glargine daily at bedtime, 0-15 units insulin aspart TID with meals).   Diet Order:   Diet Order            Diet Carb Modified Fluid consistency: Thin; Room service appropriate? Yes  Diet effective now                 EDUCATION NEEDS:   No education  needs have been identified at this time  Skin:  Skin Assessment: Skin Integrity Issues: Skin Integrity Issues:: Other (Comment) Other: lt second toe full thickness wound with possible exposed joint; highly necrotic, full thickness BUE wounds related to IV drug abuse  Last BM:  09/06/19  Height:   Ht Readings from Last 1 Encounters:  08/27/19 5\' 9"  (1.753 m)    Weight:   Wt Readings from Last 1 Encounters:  09/08/19 76.9 kg    Ideal Body Weight:  72.7 kg  BMI:  Body mass index is 25.05 kg/m.  Estimated Nutritional Needs:   Kcal:  0454-0981  Protein:  130-145 grams  Fluid:  > 2.2 L    Loistine Chance, RD, LDN, [place] Registered Dietitian II Certified Diabetes Care and Education Specialist Please refer to [facility] for RD and/or RD on-call/weekend/after hours pager

## 2019-09-09 ENCOUNTER — Ambulatory Visit: Payer: Medicaid Other | Admitting: Nurse Practitioner

## 2019-09-09 LAB — BASIC METABOLIC PANEL
Anion gap: 12 (ref 5–15)
BUN: 25 mg/dL — ABNORMAL HIGH (ref 6–20)
CO2: 18 mmol/L — ABNORMAL LOW (ref 22–32)
Calcium: 8.7 mg/dL — ABNORMAL LOW (ref 8.9–10.3)
Chloride: 106 mmol/L (ref 98–111)
Creatinine, Ser: 1.23 mg/dL (ref 0.61–1.24)
GFR calc Af Amer: >60 mL/min (ref >60)
GFR calc non Af Amer: >60 mL/min (ref >60)
Glucose, Bld: 328 mg/dL — ABNORMAL HIGH (ref 70–99)
Potassium: 3.8 mmol/L (ref 3.5–5.1)
Sodium: 136 mmol/L (ref 135–145)

## 2019-09-09 LAB — GLUCOSE, CAPILLARY
Glucose-Capillary: 126 mg/dL — ABNORMAL HIGH (ref 70–99)
Glucose-Capillary: 181 mg/dL — ABNORMAL HIGH (ref 70–99)
Glucose-Capillary: 201 mg/dL — ABNORMAL HIGH (ref 70–99)
Glucose-Capillary: 435 mg/dL — ABNORMAL HIGH (ref 70–99)

## 2019-09-09 LAB — VANCOMYCIN, TROUGH: Vancomycin Tr: 11 ug/mL — ABNORMAL LOW (ref 15–20)

## 2019-09-09 MED ORDER — HYDROMORPHONE HCL 1 MG/ML IJ SOLN
0.5000 mg | INTRAMUSCULAR | Status: DC | PRN
  Administered 2019-09-09 – 2019-09-10 (×6): 0.5 mg via INTRAVENOUS
  Filled 2019-09-09 (×7): qty 0.5

## 2019-09-09 MED ORDER — VANCOMYCIN HCL IN DEXTROSE 1-5 GM/200ML-% IV SOLN
1000.0000 mg | Freq: Two times a day (BID) | INTRAVENOUS | Status: DC
  Administered 2019-09-09 – 2019-09-10 (×2): 1000 mg via INTRAVENOUS
  Filled 2019-09-09 (×2): qty 200

## 2019-09-09 MED ORDER — MORPHINE SULFATE ER 15 MG PO TBCR
15.0000 mg | EXTENDED_RELEASE_TABLET | Freq: Two times a day (BID) | ORAL | Status: DC
  Administered 2019-09-09 – 2019-09-10 (×2): 15 mg via ORAL
  Filled 2019-09-09 (×2): qty 1

## 2019-09-09 MED ORDER — LORAZEPAM 2 MG/ML IJ SOLN
1.0000 mg | Freq: Four times a day (QID) | INTRAMUSCULAR | Status: DC | PRN
  Administered 2019-09-09 – 2019-09-10 (×5): 1 mg via INTRAVENOUS
  Filled 2019-09-09 (×5): qty 1

## 2019-09-09 MED ORDER — INSULIN ASPART 100 UNIT/ML ~~LOC~~ SOLN
6.0000 [IU] | Freq: Once | SUBCUTANEOUS | Status: AC
  Administered 2019-09-09: 6 [IU] via SUBCUTANEOUS

## 2019-09-09 NOTE — TOC Initial Note (Signed)
Transition of Care [facility]) - Initial/Assessment Note    Patient Details  Name: Luke [name] MRN: [id number] Date of Birth: 02-23-1981  Transition of [facility]) CM/SW Contact:    Shade Flood, LCSW Phone Number: 09/09/2019, 3:32 PM  Clinical Narrative:                  Received consult for AODA treatment resources for pt. Reviewed pt's record and discussed pt status with RN in [place]. Pt interested in Suboxone treatment per MD notes. Have attempted to reach pt by phone several times today both on his personal cell and on the room phone. Have not received an answer. Generic VMM left on pt's cell phone indicating a TOC colleague will be by his room to drop off a resource list for him. This LCSW work phone number left for pt to return call with any questions/needs.  MD talking to Dr. Rockne Menghini and pt about possibility of going home with the locked PICC line and outpatient completion of IV therapy.   TOC will follow and assist as needed.  Expected Discharge Plan: Home/Self Care Barriers to Discharge: Active Substance Use with PICC Line   Patient Goals and CMS Choice        Expected Discharge Plan and Services Expected Discharge Plan: Home/Self Care In-house Referral: Clinical Social Work     Living arrangements for the past 2 months: Single Family Home                                      Prior Living Arrangements/Services Living arrangements for the past 2 months: Single Family Home Lives with:: Spouse Patient language and need for interpreter reviewed:: Yes Do you feel safe going back to the place where you live?: Yes      Need for Family Participation in Patient Care: No (Comment) Care giver support system in place?: Yes (comment)   Criminal Activity/Legal Involvement Pertinent to Current Situation/Hospitalization: No - Comment as needed  Activities of Daily Living      Permission Sought/Granted                  Emotional Assessment        Orientation: : Oriented to Self, Oriented to Place, Oriented to  Time, Oriented to Situation Alcohol / Substance Use: Illicit Drugs Psych Involvement: No (comment)  Admission diagnosis:  Congestive heart failure (CHF) ([name]) [F09.3] Systolic congestive heart failure, unspecified HF chronicity ([name]) [I50.20] Patient Active Problem List   Diagnosis Date Noted   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   Arm wound 08/31/2019   Congestive heart failure (CHF) ([place]) 08/27/2019   Chest pain 08/20/2019   Elevated troponin 08/20/2019   Abscess of right forearm    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   Acute bacterial endocarditis    Sepsis ([place]) 05/09/2019   Heroin abuse ([place]) 05/09/2019   Right forearm cellulitis    Hypothyroidism    Subacute osteomyelitis, left ankle and foot ([place])    Cutaneous abscess of left foot 09/04/2018   Diabetic ulcer of left foot ([place]) 08/28/2018   Acute hematogenous osteomyelitis, left ankle and foot ([place])    Streptococcal bacteremia 11/25/2017   Hepatitis C 11/25/2017   Actinomyces infection    GERD (gastroesophageal reflux disease) 07/21/2017   Diabetic polyneuropathy associated with type 1 diabetes mellitus ([name])    Prostatitis, acute    MRSA infection    Noncompliant bladder  Urinary retention    Prostate abscess    ARF (acute renal failure) ([place]) 12/16/2016   Anemia 12/16/2016   Cocaine abuse ([place]) 08/16/2015   Uncontrolled insulin dependent type 1 diabetes mellitus ([place]) 08/16/2015   Cellulitis 12/03/2014   AKI (acute kidney injury) ([place]) 12/03/2014   Elevated liver enzymes 07/26/2014   Dental caries 07/23/2014   Tinea pedis 07/23/2014   DKA (diabetic ketoacidoses) ([place]) 11/23/2013   Abscess 10/14/2012   CKD (chronic kidney disease) stage 3, GFR 30-59 ml/min 10/14/2012   Drug abuse, IV ([place]) 10/11/2012   Tobacco abuse 10/11/2012   Essential hypertension 10/11/2012   PCP:  Gildardo Pounds,  NP Pharmacy:   [facility] [address], [place] [address] Phone: [phone] Fax: [place], [address] [place] [address] Phone: [phone] Fax: [phone]  [id number] [place], [place] [address] [place] [address] Phone: [phone] Fax: [phone]  [facility] [place], [place] - [address] [place] [address] Phone: [phone] Fax: [phone]     Social Determinants of Health ([place]) Interventions    Readmission Risk Interventions Readmission Risk Prevention Plan 09/09/2019 05/11/2019  Transportation Screening - Complete  PCP or Specialist Appt within 5-7 Days - Not Complete  Not Complete comments - pt agreeable when ready for dc.  Home Care Screening - Complete  Medication Review (RN CM) - Referral to Pharmacy  Palliative Care Screening Not Applicable -  [place] Not Applicable -  Some recent data might be hidden

## 2019-09-09 NOTE — Progress Notes (Addendum)
Un able to draws lab tried to draw twice.

## 2019-09-09 NOTE — Progress Notes (Signed)
Patient ID: Luke [name], male   DOB: 1980-11-08, 39 y.o.   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 Given left sided involvement and a history of interrupted therapy in the past I discussed with him that I would prefer to continue going with the IV antibiotics for 2 more weeks. He is able to stay for 1 more week and then will need to be home with his family for childcare needs while his wife returns to work.   I think we should still arrange for long acting Oritavancin next Wednesday and send with home with Levaquin for another 7 days.   In discussion about SOB I do think some of it is anxiety driven, to which he tends to agree.   His arms appear to be healing nicely. Pain is improved.    PLAN: 1. Continue vancomycin and ceftriaxone for another week  2. Arrange Oritavancin x 1 for Wednesday June 30th  3. D/C home with Levaquin 750 mg QD (QTc has been normal)  Follow up in ID clinic has been arranged     Principal Problem:   Endocarditis of mitral valve Active Problems:   Drug abuse, IV ([name])   Tobacco abuse   CKD (chronic kidney disease) stage 3, GFR 30-59 ml/min   Uncontrolled insulin dependent type 1 diabetes mellitus ([place])   Diabetic ulcer of left foot ([name])   Hypothyroidism   Heroin abuse ([place])   Congestive heart failure (CHF) ([name])   Arm wound   Endocarditis of pulmonic valve   Scheduled Meds: . atorvastatin  20 mg Oral Daily  . carvedilol  6.25 mg Oral BID WC  . docusate sodium  100 mg Oral BID  . DULoxetine  30 mg Oral Daily  . enoxaparin (LOVENOX) injection  40 mg Subcutaneous Q24H  . gabapentin  400 mg Oral TID  . insulin aspart  0-15 Units Subcutaneous TID WC  . insulin  aspart  0-5 Units Subcutaneous QHS  . insulin aspart  3 Units Subcutaneous TID WC  . insulin glargine  26 Units Subcutaneous QHS  . levothyroxine  50 mcg Oral Q0600  . morphine  30 mg Oral Q12H  . multivitamin with minerals  1 tablet Oral Daily  . nicotine  14 mg Transdermal Daily  . pantoprazole  40 mg Oral Daily  . Ensure Max Protein  11 oz Oral QHS  . sertraline  50 mg Oral Daily  . sodium chloride flush  10-40 mL Intracatheter Q12H  . sodium chloride flush  3 mL Intravenous Q12H  . traZODone  50 mg Oral QHS   Continuous Infusions: . sodium chloride 10 mL/hr at 09/07/19 0300  . cefTRIAXone (ROCEPHIN)  IV 2 g (09/08/19 1256)  . lactated ringers 10 mL/hr at 09/07/19 2258  . vancomycin 1,750 mg (09/08/19 1754)   PRN Meds:.sodium chloride, acetaminophen **OR** acetaminophen, cyclobenzaprine, hydrALAZINE, HYDROmorphone (DILAUDID) injection, melatonin, ondansetron **OR** ondansetron (ZOFRAN) IV, polyethylene glycol, sodium chloride flush   SUBJECTIVE: He says that his forearm wounds are looking better.  Review of Systems: Review of Systems  Constitutional: Negative for chills, diaphoresis and fever.    No Known Allergies  OBJECTIVE:  Vitals:   09/08/19 1118 09/08/19 1958 09/08/19 2300 09/09/19 0331  BP: 131/76 127/79  128/79  Pulse: 97 (!) 101 93 (!) 101  Resp: 20 18  18   Temp: 98.3 F (36.8 C) 98.5 F (36.9 C)  99 F (37.2 C)  TempSrc: Oral Oral  Oral  SpO2: 99% 93%  96%  Weight:    77.2 kg  Height:       Body mass index is 25.13 kg/m.  Physical Exam Constitutional:      Comments: He is resting quietly in bed.  Cardiovascular:     Rate and Rhythm: Normal rate and regular rhythm.     Heart sounds: No murmur heard.   Pulmonary:     Effort: Pulmonary effort is normal.     Breath sounds: Normal breath sounds.  Musculoskeletal:     Comments: Ace wraps on both forearms.  Psychiatric:        Mood and Affect: Mood normal.     Lab Results Lab Results    Component Value Date   WBC 14.3 (H) 09/03/2019   HGB 8.7 (L) 09/03/2019   HCT 27.5 (L) 09/03/2019   MCV 83.6 09/03/2019   PLT 458 (H) 09/03/2019    Lab Results  Component Value Date   CREATININE 1.26 (H) 09/06/2019   BUN 25 (H) 09/06/2019   NA 136 09/06/2019   K 3.6 09/06/2019   CL 105 09/06/2019   CO2 23 09/06/2019    Lab Results  Component Value Date   ALT 12 08/29/2019   AST 13 (L) 08/29/2019   ALKPHOS 96 08/29/2019   BILITOT 0.4 08/29/2019     Microbiology: Recent Results (from the past 240 hour(s))  Surgical pcr screen     Status: Abnormal   Collection Time: 08/31/19 12:19 PM   Specimen: Nasal Mucosa; Nasal Swab  Result Value Ref Range Status   MRSA, PCR POSITIVE (A) NEGATIVE Final    Comment: RESULT CALLED TO, READ BACK BY AND VERIFIED WITH: Octavio Manns RN 14:30 08/31/19 ([email or web address])    Staphylococcus aureus POSITIVE (A) NEGATIVE Final    Comment: (NOTE) The Xpert SA Assay (FDA approved for NASAL specimens in patients 16 years of age and older), is one component of a comprehensive surveillance program. It is not intended to diagnose infection nor to guide or monitor treatment. Performed at [facility], [place] [address], [place], [place] [address]     [name], [place] for [place] [phone] pager   [phone] cell 09/09/2019, 9:16 AM

## 2019-09-09 NOTE — Progress Notes (Signed)
PROGRESS NOTE    Luke [name]  QBV:[id number] DOB: Sep 10, 1980 DOA: 08/27/2019 PCP: Gildardo Pounds, NP    Brief Narrative: 39 year old male with type 1 diabetes IV drug abuse polysubstance abuse seizures endocarditis stage III CKD peripheral vascular disease left great toe amputation and hypothyroidism and hypertension admitted with shortness of breath. He was found to have left arm cellulitis with a deep ulcer on 08/20/2019 in the emergency room. Plastic surgery recommended surgical intervention patient left AMA on 08/21/2019. His friend brought him back to the hospital since patient had substernal chest pain and could not breathe.   Assessment & Plan:   Principal Problem:   Endocarditis of mitral valve Active Problems:   Drug abuse, IV ([name])   Tobacco abuse   CKD (chronic kidney disease) stage 3, GFR 30-59 ml/min   Uncontrolled insulin dependent type 1 diabetes mellitus ([place])   Diabetic ulcer of left foot ([name])   Hypothyroidism   Heroin abuse ([place])   Congestive heart failure (CHF) ([name])   Arm wound   Endocarditis of pulmonic valve   #1  Endocarditis mitral and pulmonary valve endocarditis.   On vancomycin and rocephin. ID following. Recommending 4 weeks of antibiotic therapy. Previous attending discussed with cardiology on 08/29/2019. It was thought that MR was not severe enough to do any surgical intervention at this time. He was seen by Dr. Kipp Brood in the past and was deemed not a good candidate for surgery.  #2 right forearm necrosis status post surgery 09/01/2019 status post debridement of the right forearm and debridement of the left forearm.  Plastics following.  Continue daily dressing changes.  They plan to remove Mepitel mesh in 2 weeks.  #3 type 1 diabetes Increased lantus 26 units on 09/08/2019.  And NovoLog 3 units 3 times daily. CBG (last 3)  Recent Labs    09/08/19 2128 09/09/19 0619 09/09/19 1153  GLUCAP 309* 126* 181*      #4 stage IIIa CKD  creatinine stable on vancomycin monitor renal functions   #5 polysubstance abuse consulted case manager to provide resources on discharge.  #6 normocytic anemia with no evidence of active bleed  #7 history of essential hypertension-blood pressure 127/82.  Norvasc was stopped Coreg dose was decreased continued same dose for now.    #8 tobacco abuse continue nicotine patch  #9 hypothyroidism continue Synthroid  #10 pain control Taper narcotics as tolerated   Nutrition Problem: Increased nutrient needs Etiology: wound healing     Signs/Symptoms: estimated needs    Interventions: MVI, Juven, Premier Protein  Estimated body mass index is 25.13 kg/m as calculated from the following:   Height as of this encounter: 5\' 9"  (1.753 m).   Weight as of this encounter: 77.2 kg.  DVT prophylaxis:scd Code Status:full Family Communication:none Disposition Plan:Status is: Inpatient  Dispo: Patient From: Home Planned Disposition: To be determined Expected discharge date:unknown Medically stable for discharge: No patient needs to complete 4 weeks course of IV antibiotics for endocarditis inpatient not safe to discharge him home with PICC line with history of IV drug use  Consultants:Infectious disease, plastic surgery  Procedures:Status post debridement of the right and left 09/02/2019 Antimicrobials:Vancomycin and cefepime  Subjective: Patient requesting Flexeril for the back spasm.   Staff reports more drainage on the right upper extremity  Objective: Vitals:   09/08/19 1958 09/08/19 2300 09/09/19 0331 09/09/19 1154  BP: 127/79  128/79 127/82  Pulse: (!) 101 93 (!) 101 (!) 101  Resp: 18  18 18   Temp:  98.5 F (36.9 C)  99 F (37.2 C) 98.9 F (37.2 C)  TempSrc: Oral  Oral Oral  SpO2: 93%  96% 100%  Weight:   77.2 kg   Height:        Intake/Output Summary (Last 24 hours) at 09/09/2019 1333 Last data filed at  09/09/2019 1200 Gross per 24 hour  Intake 1080 ml  Output 1150 ml  Net -70 ml   Filed Weights   09/07/19 0603 09/08/19 0510 09/09/19 0331  Weight: 76.8 kg 76.9 kg 77.2 kg    Examination:  General exam: Appears calm and comfortable  Respiratory system: Clear to auscultation. Respiratory effort normal. Cardiovascular system: S1 & S2 heard, RRR. No JVD, systolic murmurs, rubs, gallops or clicks. No pedal edema. Gastrointestinal system: Abdomen is nondistended, soft and nontender. No organomegaly or masses felt. Normal bowel sounds heard. Central nervous system: Alert and oriented. No focal neurological deficits. Extremities: Both hands upper extremities are covered with wraps Skin: No rashes, lesions or ulcers Psychiatry: Judgement and insight appear normal. Mood & affect appropriate.     Data Reviewed: I have personally reviewed following labs and imaging studies  CBC: Recent Labs  Lab 09/03/19 0500  WBC 14.3*  HGB 8.7*  HCT 27.5*  MCV 83.6  PLT 076*   Basic Metabolic Panel: Recent Labs  Lab 09/03/19 0500 09/06/19 0631  NA 139 136  K 3.7 3.6  CL 110 105  CO2 22 23  GLUCOSE 71 193*  BUN 23* 25*  CREATININE 1.30* 1.26*  CALCIUM 8.6* 8.5*   GFR: Estimated Creatinine Clearance: 78.7 mL/min (A) (by C-G formula based on SCr of 1.26 mg/dL (H)). Liver Function Tests: No results for input(s): AST, ALT, ALKPHOS, BILITOT, PROT, ALBUMIN in the last 168 hours. No results for input(s): LIPASE, AMYLASE in the last 168 hours. No results for input(s): AMMONIA in the last 168 hours. Coagulation Profile: No results for input(s): INR, PROTIME in the last 168 hours. Cardiac Enzymes: No results for input(s): CKTOTAL, CKMB, CKMBINDEX, TROPONINI in the last 168 hours. BNP (last 3 results) No results for input(s): PROBNP in the last 8760 hours. HbA1C: No results for input(s): HGBA1C in the last 72 hours. CBG: Recent Labs  Lab 09/08/19 1117 09/08/19 1620 09/08/19 2128  09/09/19 0619 09/09/19 1153  GLUCAP 253* 189* 309* 126* 181*   Lipid Profile: No results for input(s): CHOL, HDL, LDLCALC, TRIG, CHOLHDL, LDLDIRECT in the last 72 hours. Thyroid Function Tests: No results for input(s): TSH, T4TOTAL, FREET4, T3FREE, THYROIDAB in the last 72 hours. Anemia Panel: No results for input(s): VITAMINB12, FOLATE, FERRITIN, TIBC, IRON, RETICCTPCT in the last 72 hours. Sepsis Labs: No results for input(s): PROCALCITON, LATICACIDVEN in the last 168 hours.  Recent Results (from the past 240 hour(s))  Surgical pcr screen     Status: Abnormal   Collection Time: 08/31/19 12:19 PM   Specimen: Nasal Mucosa; Nasal Swab  Result Value Ref Range Status   MRSA, PCR POSITIVE (A) NEGATIVE Final    Comment: RESULT CALLED TO, READ BACK BY AND VERIFIED WITH: Octavio Manns RN 14:30 08/31/19 ([email or web address])    Staphylococcus aureus POSITIVE (A) NEGATIVE Final    Comment: (NOTE) The Xpert SA Assay (FDA approved for NASAL specimens in patients 77 years of age and older), is one component of a comprehensive surveillance program. It is not intended to diagnose infection nor to guide or monitor treatment. Performed at [facility], [place] [address], [place], [place] [address]  Radiology Studies: No results found.      Scheduled Meds: . atorvastatin  20 mg Oral Daily  . carvedilol  6.25 mg Oral BID WC  . docusate sodium  100 mg Oral BID  . DULoxetine  30 mg Oral Daily  . enoxaparin (LOVENOX) injection  40 mg Subcutaneous Q24H  . gabapentin  400 mg Oral TID  . insulin aspart  0-15 Units Subcutaneous TID WC  . insulin aspart  0-5 Units Subcutaneous QHS  . insulin aspart  3 Units Subcutaneous TID WC  . insulin glargine  26 Units Subcutaneous QHS  . levothyroxine  50 mcg Oral Q0600  . morphine  30 mg Oral Q12H  . multivitamin with minerals  1 tablet Oral Daily  . nicotine  14 mg Transdermal Daily  . pantoprazole  40 mg Oral Daily  . Ensure Max Protein  11 oz  Oral QHS  . sertraline  50 mg Oral Daily  . sodium chloride flush  10-40 mL Intracatheter Q12H  . sodium chloride flush  3 mL Intravenous Q12H  . traZODone  50 mg Oral QHS   Continuous Infusions: . sodium chloride 10 mL/hr at 09/07/19 0300  . cefTRIAXone (ROCEPHIN)  IV 2 g (09/08/19 1256)  . lactated ringers 10 mL/hr at 09/07/19 2258  . vancomycin 1,750 mg (09/08/19 1754)     LOS: 13 days     Georgette Shell, MD  09/09/2019, 1:33 PM

## 2019-09-09 NOTE — Progress Notes (Signed)
Pharmacy Antibiotic Note  Luke [name] is a 39 y.o. male admitted on 08/27/2019 with MV and PV endocarditis, being treated with vancomycin.   Steady-state vancomycin trough level drawn prior to fourth dose of vancomycin 1750 mg IV Q 24 hr regimen is 11 mg/L (level was drawn nearly 2 hrs late because of difficulty obtaining the sample, but true trough likely <goal range of 15-20 mg/L)  Scr drawn at the same time as the vancomycin trough this evening was 1.23 (down from values over the past week), CrCl 80.6 ml/min (estimated vancomycin half-life using this CrCl is ~9.7 hrs)  Per ID note, plan to continue vancomycin and ceftriaxone X 1 more week, then discharge pt on oritavancin X 1 plus levofloxacin for another 7 days  Plan: Change vancomycin regimen to 1 gram IV Q 12 hrs Monitor WBC, temp, clinical improvement, cultures, renal function, steady-state vancomycin levels  Height: 5\' 9"  (175.3 cm) Weight: 77.2 kg (170 lb 3.2 oz) (scale c) IBW/kg (Calculated) : 70.7  Temp (24hrs), Avg:98.8 F (37.1 C), Min:98.4 F (36.9 C), Max:99 F (37.2 C)  Recent Labs  Lab 09/03/19 0500 09/03/19 0521 09/06/19 0631 09/06/19 1648 09/09/19 1943  WBC 14.3*  --   --   --   --   CREATININE 1.30*  --  1.26*  --  1.23  VANCOTROUGH  --    < >  --  10* 11*   < > = values in this interval not displayed.    Estimated Creatinine Clearance: 80.6 mL/min (by C-G formula based on SCr of 1.23 mg/dL).    No Known Allergies  Antimicrobials this admission: Cefepime 6/10 >> 6/18 Vancomycin 6/10 >>  Ceftriaxone 6/18 >>  Dose adjustments this admission:  6/13 VT = 21: vancomycin 1000 mg q12h to 750mg  q 12 hr  6/17 VT = 23: vancomycin 750 mg q12h to 1250mg  q 24 hr 6/23 VT = 11 (drawn ~2 hrs late): vancomycin 1000 mg IV Q 12 hrs (renal function has improved since was on this regimen earlier this admission)   Microbiology results: 6/2, 6/10 BCx X 2: NG/final 3/5 MRSA neg  2/19 Bcx strep mitis/oralis  Gillermina Hu, PharmD, BCPS, [facility] Clinical Pharmacist 09/09/2019

## 2019-09-10 ENCOUNTER — Inpatient Hospital Stay (HOSPITAL_COMMUNITY): Payer: Medicaid Other

## 2019-09-10 LAB — CBC
HCT: 27 % — ABNORMAL LOW (ref 39.0–52.0)
Hemoglobin: 8.7 g/dL — ABNORMAL LOW (ref 13.0–17.0)
MCH: 26.3 pg (ref 26.0–34.0)
MCHC: 32.2 g/dL (ref 30.0–36.0)
MCV: 81.6 fL (ref 80.0–100.0)
Platelets: 401 10*3/uL — ABNORMAL HIGH (ref 150–400)
RBC: 3.31 MIL/uL — ABNORMAL LOW (ref 4.22–5.81)
RDW: 15 % (ref 11.5–15.5)
WBC: 12.2 10*3/uL — ABNORMAL HIGH (ref 4.0–10.5)
nRBC: 0 % (ref 0.0–0.2)

## 2019-09-10 LAB — GLUCOSE, CAPILLARY
Glucose-Capillary: 169 mg/dL — ABNORMAL HIGH (ref 70–99)
Glucose-Capillary: 54 mg/dL — ABNORMAL LOW (ref 70–99)
Glucose-Capillary: 171 mg/dL — ABNORMAL HIGH (ref 70–99)
Glucose-Capillary: 302 mg/dL — ABNORMAL HIGH (ref 70–99)

## 2019-09-10 MED ORDER — CARVEDILOL 6.25 MG PO TABS: 6.2500 mg | ORAL_TABLET | Freq: Two times a day (BID) | ORAL | 0 refills | Status: DC

## 2019-09-10 MED ORDER — NICOTINE 14 MG/24HR TD PT24: 14.0000 mg | MEDICATED_PATCH | Freq: Every day | TRANSDERMAL | 0 refills | Status: DC

## 2019-09-10 MED ORDER — ORITAVANCIN DIPHOSPHATE 400 MG IV SOLR
1200.0000 mg | Freq: Once | INTRAVENOUS | Status: AC
  Administered 2019-09-10: 1200 mg via INTRAVENOUS
  Filled 2019-09-10: qty 120

## 2019-09-10 MED ORDER — LEVOFLOXACIN 750 MG PO TABS: 750.0000 mg | ORAL_TABLET | Freq: Every day | ORAL | 0 refills | Status: DC

## 2019-09-10 MED FILL — NICOTINE 14 MG/24HR PATCH: 14 | 28 days supply | Qty: 28 | Fill #0

## 2019-09-10 MED FILL — CARVEDILOL 6.25 MG TABLET: 6.25 | 30 days supply | Qty: 60 | Fill #0

## 2019-09-10 MED FILL — levoFLOXacin 750 MG TABS: 750 | 14 days supply | Qty: 14 | Fill #0

## 2019-09-10 NOTE — Progress Notes (Addendum)
ID Progress Note:   Notified by Dr. Zigmund Daniel that Luke [name] has decided to leave against recommendations to continue with IV therapy. Will arrange long-acting Oritavancin dose for him and work on getting 14 more days of Levaquin to him before he leaves.   He has an appointment with me in early July for follow up and consideration of treatment of hepatitis C infection.    [name], MSN, NP-C [facility] for Infectious Disease [email or web address] Pager: [phone] Office: [phone] [place]: [phone]

## 2019-09-10 NOTE — TOC Initial Note (Signed)
Transition of Care [facility]) - Initial/Assessment Note    Patient Details  Name: Luke [name] MRN: [id number] Date of Birth: 1980-05-23  Transition of Care [facility]) CM/SW Contact:    Zenon Mayo, RN Phone Number: 09/10/2019, 3:33 PM  Clinical Narrative:                 Patient is for dc today, he has transportation, TOC brought his medications to his room.  He has follow up at [facility] clinic, he has no other needs.  Expected Discharge Plan: Home/Self Care Barriers to Discharge: No Barriers Identified   Patient Goals and CMS Choice Patient states their goals for this hospitalization and ongoing recovery are:: not to get back on drugs   Choice offered to / list presented to : NA  Expected Discharge Plan and Services Expected Discharge Plan: Home/Self Care In-house Referral: NA Discharge Planning Services: CM Consult   Living arrangements for the past 2 months: Single Family Home Expected Discharge Date: 09/10/19                 DME Agency: NA       HH Arranged: NA          Prior Living Arrangements/Services Living arrangements for the past 2 months: Single Family Home Lives with:: Spouse, Minor Children Patient language and need for interpreter reviewed:: Yes Do you feel safe going back to the place where you live?: Yes      Need for Family Participation in Patient Care: Yes (Comment) Care giver support system in place?: Yes (comment)   Criminal Activity/Legal Involvement Pertinent to Current Situation/Hospitalization: No - Comment as needed  Activities of Daily Living      Permission Sought/Granted                  Emotional Assessment Appearance:: Appears stated age Attitude/Demeanor/Rapport: Engaged Affect (typically observed): Appropriate Orientation: : Oriented to Self, Oriented to Place, Oriented to  Time, Oriented to Situation Alcohol / Substance Use: Illicit Drugs Psych Involvement: No (comment)  Admission diagnosis:  Congestive heart  failure (CHF) ([place]) [H63.1] Systolic congestive heart failure, unspecified HF chronicity ([place]) [I50.20] Patient Active Problem List   Diagnosis Date Noted  . 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 Arm wound 08/31/2019  . Congestive heart failure (CHF) ([place]) 08/27/2019  . Chest pain 08/20/2019  . Elevated troponin 08/20/2019  . Abscess of right forearm   . 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 Acute bacterial endocarditis   . Sepsis ([place]) 05/09/2019  . Heroin abuse ([place]) 05/09/2019  . Right forearm cellulitis   . Hypothyroidism   . Subacute osteomyelitis, left ankle and foot ([place])   . Cutaneous abscess of left foot 09/04/2018  . Diabetic ulcer of left foot ([place]) 08/28/2018  . Acute hematogenous osteomyelitis, left ankle and foot ([place])   . Streptococcal bacteremia 11/25/2017  . Hepatitis C 11/25/2017  . Actinomyces infection   . GERD (gastroesophageal reflux disease) 07/21/2017  . Diabetic polyneuropathy associated with type 1 diabetes mellitus ([place])   . Prostatitis, acute   . MRSA infection   . Noncompliant bladder   . Urinary retention   . Prostate abscess   . ARF (acute renal failure) ([place]) 12/16/2016  . Anemia 12/16/2016  . Cocaine abuse ([place]) 08/16/2015  . Uncontrolled insulin dependent type 1 diabetes mellitus ([place]) 08/16/2015  . Cellulitis 12/03/2014  . AKI (acute kidney injury) ([place]) 12/03/2014  . Elevated liver enzymes 07/26/2014  . Dental caries 07/23/2014  .  Tinea pedis 07/23/2014  . DKA (diabetic ketoacidoses) ([place]) 11/23/2013  . Abscess 10/14/2012  . CKD (chronic kidney disease) stage 3, GFR 30-59 ml/min 10/14/2012  . 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 Tobacco abuse 10/11/2012  . Essential hypertension 10/11/2012   PCP:  Gildardo Pounds, NP Pharmacy:   [facility], [place] [address] Phone: [phone] Fax: [place], [address] [place] [address] Phone: [phone] Fax: [phone]  [id number] [place], [place] - [place] [address] [place] [address] Phone: [phone] Fax: [phone]  [facility] [place], [place] - [address] [place] [address] Phone: [phone] Fax: [phone]     Social Determinants of Health ([place]) Interventions    Readmission Risk Interventions Readmission Risk Prevention Plan 09/10/2019 09/09/2019 05/11/2019  Transportation Screening Complete - Complete  PCP or Specialist Appt within 5-7 Days - - Not Complete  Not Complete comments - - pt agreeable when ready for dc.  Home Care Screening - - Complete  Medication Review (RN CM) - - Referral to Pharmacy  Medication Review (RN Care Manager) Complete - -  PCP or Specialist appointment within 3-5 days of discharge Complete - -  [place] or Home Care Consult Complete - -  SW Recovery Care/Counseling Consult Complete - -  Palliative Care Screening Not Applicable Not Applicable -  [place] Not Applicable Not Applicable -  Some recent data might be hidden

## 2019-09-10 NOTE — TOC Transition Note (Signed)
Transition of Care [facility]) - CM/SW Discharge Note   Patient Details  Name: Luke [name] MRN: [id number] Date of Birth: [birth year]  Transition of Care [facility] CM/SW Contact:  Zenon Mayo, RN Phone Number: 09/10/2019, 3:38 PM   Clinical Narrative:    Patient is for dc today, he has transportation, TOC brought his medications to his room.  He has follow up at [facility] clinic. He has no other needs.   Final next level of care: Home/Self Care Barriers to Discharge: No Barriers Identified   Patient Goals and CMS Choice Patient states their goals for this hospitalization and ongoing recovery are:: to get off drugs   Choice offered to / list presented to : NA  Discharge Placement                       Discharge Plan and Services In-house Referral: NA Discharge Planning Services: CM Consult              DME Agency: NA       HH Arranged: NA          Social Determinants of Health (SDOH) Interventions     Readmission Risk Interventions Readmission Risk Prevention Plan 09/10/2019 09/09/2019 05/11/2019  Transportation Screening Complete - Complete  PCP or Specialist Appt within 5-7 Days - - Not Complete  Not Complete comments - - pt agreeable when ready for dc.  Home Care Screening - - Complete  Medication Review (RN CM) - - Referral to Pharmacy  Medication Review (RN Care Manager) Complete - -  PCP or Specialist appointment within 3-5 days of discharge Complete - -  [place] or Home Care Consult Complete - -  SW Recovery Care/Counseling Consult Complete - -  Palliative Care Screening Not Applicable Not Applicable -  [place] Not Applicable Not Applicable -  Some recent data might be hidden

## 2019-09-10 NOTE — Progress Notes (Signed)
Inpatient Diabetes Program Recommendations  AACE/ADA: New Consensus Statement on Inpatient Glycemic Control (2015)  Target Ranges:  Prepandial:   less than 140 mg/dL      Peak postprandial:   less than 180 mg/dL (1-2 hours)      Critically ill patients:  140 - 180 mg/dL   Lab Results  Component Value Date   GLUCAP 169 (H) 09/10/2019   HGBA1C 10.8 (H) 08/27/2019    Review of Glycemic Control Results for Luke [name], Luke [name] (MRN [id number]) as of 09/10/2019 09:46  Ref. Range 09/09/2019 11:53 09/09/2019 16:42 09/09/2019 21:25 09/10/2019 06:16  Glucose-Capillary Latest Ref Range: 70 - 99 mg/dL 181 (H) 201 (H) 435 (H) 169 (H)   Diabetes history:DM type 1 Outpatient Diabetes medications:Lantus 40 unitsQD, Humalog4-20units tid Current orders for Inpatient glycemic control:Lantus 26 units QHS, Novolog 0-15 units TID, Novolog 0-5 units QHS, Novolog 3 units TID  Inpatient Diabetes Program Recommendations:  Consider adding Novolog 5 units TID (assuming patient is consuming >50% of meal).   Thanks, Bronson Curb, MSN, RNC-OB Diabetes Coordinator [phone] (8a-5p)

## 2019-09-10 NOTE — Discharge Summary (Signed)
Physician Discharge Summary  Luke [name] IOX:[id number] DOB: Jan 17, 1981 DOA: 08/27/2019  PCP: Gildardo Pounds, NP  Admit date: 08/27/2019 Discharge date: 09/10/2019  Admitted From: Home Disposition: Home  Recommendations for Outpatient Follow-up:  1. Follow up with PCP in 1-2 weeks 2. Please obtain BMP/CBC in one week  Home Health: None Equipment/Devices: None Discharge Condition: Stable CODE STATUS full code Diet recommendation: Cardiac Brief/Interim Summary:39 year old male with type 1 diabetes IV drug abuse polysubstance abuse seizures endocarditis stage III CKD peripheral vascular disease left great toe amputation and hypothyroidism and hypertension admitted with shortness of breath. He was found to have left arm cellulitis with a deep ulcer on 08/20/2019 in the emergency room. Plastic surgery recommended surgical intervention patient left AMA on 08/21/2019. His friend brought him back to the hospital since patient had substernal chest pain and could not breathe.   Discharge Diagnoses:  Principal Problem:   Endocarditis of mitral valve Active Problems:   Drug abuse, IV ([name])   Tobacco abuse   CKD (chronic kidney disease) stage 3, GFR 30-59 ml/min   Uncontrolled insulin dependent type 1 diabetes mellitus ([place])   Diabetic ulcer of left foot ([name])   Hypothyroidism   Heroin abuse ([place])   Congestive heart failure (CHF) ([name])   Arm wound   Endocarditis of pulmonic valve   #1  Endocarditis mitral and pulmonary valve endocarditis.    He was treated with vancomycin and Rocephin the plan was to continue this for 4 weeks.  However patient decided to leave 09/10/2019 without finishing the full 4 weeks.  Infectious disease was following the patient.  They ordered long-acting oritavancin prior to discharge and gave him 14 days of Levaquin to finish at home.  His case was discussed with cardiothoracic surgery by previous  attending.  He was seen by Dr. Kipp Brood in the past and was deemed  not a good candidate for surgery   #2 right forearm necrosis status post surgery 09/01/2019 status post debridement of the right forearm and debridement of the left forearm.  He was followed by plastic surgery.  Follow-up with them within 2 weeks to remove the Mepitel mesh.  #3 type 1 diabetes continue insulin as prior to admission.   #4 stage IIIa CKD creatinine stable  #5 polysubstance abuse consulted case manager to provide resources on discharge.  #6 normocytic anemia with no evidence of active bleed  #7 history of essential hypertension-blood pressure 127/82.  Norvasc was stopped Coreg dose was decreased continued same dose for now.    #8 tobacco abuse continue nicotine patch  #9 hypothyroidism continue home dose of Synthroid.     Nutrition Problem: Increased nutrient needs Etiology: wound healing    Signs/Symptoms: estimated needs     Interventions: MVI, Juven, Premier Protein  Estimated body mass index is 24.91 kg/m as calculated from the following:   Height as of this encounter: '5\' 9"'$  (1.753 m).   Weight as of this encounter: 76.5 kg.  Discharge Instructions  Discharge Instructions    Diet - low sodium heart healthy   Complete by: As directed    Discharge wound care:   Complete by: As directed    Place Xeroform gauze over bilateral upper extremity wounds and left great toe wound secured with Kerlix and change daily.   Increase activity slowly   Complete by: As directed      Allergies as of 09/10/2019   No Known Allergies     Medication List    STOP taking these medications  amLODipine 5 MG tablet Commonly known as: NORVASC   gabapentin 400 MG capsule Commonly known as: NEURONTIN     TAKE these medications   atorvastatin 20 MG tablet Commonly known as: LIPITOR Take 1 tablet (20 mg total) by mouth daily.   carvedilol 6.25 MG tablet Commonly known as: COREG Take 1 tablet (6.25 mg total) by mouth 2 (two) times daily with a  meal. What changed:   medication strength  how much to take   DULoxetine 30 MG capsule Commonly known as: CYMBALTA Take 1 capsule (30 mg total) by mouth daily.   ferrous sulfate 325 (65 FE) MG tablet Take 1 tablet (325 mg total) by mouth daily with breakfast.   insulin lispro 100 UNIT/ML KwikPen Commonly known as: HumaLOG KwikPen Inject 0.04-0.2 mLs (4-20 Units total) into the skin 3 (three) times daily. Per sliding scale What changed: how much to take   Insulin Syringe-Needle U-100 31G X 5/16" 0.5 ML Misc Commonly known as: TRUEplus Insulin Syringe USE AS DIRECTED.   Lantus SoloStar 100 UNIT/ML Solostar Pen Generic drug: insulin glargine INJECT 30 UNITS INTO THE SKIN 2 (TWO) TIMES DAILY. What changed:   how much to take  how to take this  when to take this  additional instructions   levofloxacin 750 MG tablet Commonly known as: LEVAQUIN Take 1 tablet (750 mg total) by mouth daily.   levothyroxine 50 MCG tablet Commonly known as: SYNTHROID Take 1 tablet (50 mcg total) by mouth daily.   Melatonin 5 MG Chew Chew 5 mg by mouth daily as needed (sleep).   nicotine 14 mg/24hr patch Commonly known as: NICODERM CQ - dosed in mg/24 hours Place 1 patch (14 mg total) onto the skin daily. Start taking on: September 11, 2019   omeprazole 20 MG capsule Commonly known as: PRILOSEC Take 1 capsule (20 mg total) by mouth daily.   sertraline 50 MG tablet Commonly known as: ZOLOFT TAKE 1 TABLET (50 MG TOTAL) BY MOUTH DAILY.   traZODone 50 MG tablet Commonly known as: DESYREL Take 1 tablet (50 mg total) by mouth at bedtime.   True Metrix Meter Devi 1 kit by Does not apply route as directed. Use as directed   TRUEplus Lancets 28G Misc Use as directed            Discharge Care Instructions  (From admission, onward)         Start     Ordered   09/10/19 0000  Discharge wound care:       Comments: Place Xeroform gauze over bilateral upper extremity wounds and left  great toe wound secured with Kerlix and change daily.   09/10/19 1448          Follow-up Information    Gildardo Pounds, NP Follow up.   Specialty: Nurse Practitioner Contact information: [place] [address] [phone]              No Known Allergies  Consultations: ID  Procedures/Studies: DG Chest 2 View  Result Date: 09/10/2019 CLINICAL DATA:  Shortness of breath. EXAM: CHEST - 2 VIEW COMPARISON:  Most recent radiograph 09/04/2019. Most recent CT 08/21/2019 FINDINGS: Progressive bilateral perihilar opacities, greater on the right. There is also smooth septal thickening/Kerley B-lines. Trace pleural effusions. Unchanged heart size and mediastinal contours. No pneumothorax. IMPRESSION: 1. Progressive bilateral perihilar opacities, greater on the right. This may be pulmonary edema, pneumonia, or developing ARDS. Suspect at least an element of pulmonary edema with septal thickening.  2. Trace pleural effusions. Electronically Signed   By: Keith Rake M.D.   On: 09/10/2019 01:56   DG Chest 2 View  Result Date: 08/19/2019 CLINICAL DATA:  Chest pain and shortness of breath EXAM: CHEST - 2 VIEW COMPARISON:  05/22/2019 FINDINGS: Cardiac shadow is within limits. Central line is been removed in the interval. The lungs are well aerated with minimal scarring in the left base. No effusion or infiltrate is seen. No bony abnormality is noted. IMPRESSION: Chronic changes without acute abnormality. Electronically Signed   By: Inez Catalina M.D.   On: 08/19/2019 23:20   DG Forearm Left  Result Date: 08/20/2019 CLINICAL DATA:  Cellulitis, IV drug use EXAM: LEFT FOREARM - 2 VIEW COMPARISON:  None. FINDINGS: There is no evidence of fracture or other focal bone lesions. There is diffuse subcutaneous edema seen along the volar and dorsal surface of the forearm. There is possible subcutaneous emphysema seen along the posterior olecranon with a focal area of ulceration. A  IMPRESSION: No acute osseous abnormality. Diffuse subcutaneous edema and possible area of ulceration seen on the posterior forearm. Electronically Signed   By: Prudencio Pair M.D.   On: 08/20/2019 05:21   CT ANGIO CHEST PE W OR WO CONTRAST  Result Date: 08/21/2019 CLINICAL DATA:  Shortness of breath and chest pain. History of IV drug abuse. Evaluate for possible septic emboli. EXAM: CT ANGIOGRAPHY CHEST WITH CONTRAST TECHNIQUE: Multidetector CT imaging of the chest was performed using the standard protocol during bolus administration of intravenous contrast. Multiplanar CT image reconstructions and MIPs were obtained to evaluate the vascular anatomy. CONTRAST:  3m OMNIPAQUE IOHEXOL 350 MG/ML SOLN COMPARISON:  Chest x-ray from yesterday. FINDINGS: Cardiovascular: The heart is normal in size. No pericardial effusion. The aorta is normal in caliber. No dissection. No atherosclerotic calcifications. Branch vessels are patent. No coronary artery calcifications. Mild pulmonary artery enlargement could suggest pulmonary hypertension. No filling defects to suggest pulmonary embolism. Mediastinum/Nodes: Scattered borderline enlarged mediastinal and hilar lymph nodes which could be reactive/inflammatory. There is also some residual thymic tissue noted in the anterior mediastinum. Enlarged axillary lymph nodes are also noted bilaterally measuring between 12 and 19 mm. The esophagus is grossly normal. Lungs/Pleura: Patchy ill-defined areas of faint ground-glass opacity in both lungs but most significant in the left lower lobe. Findings could reflect asymmetric pulmonary edema but more likely atypical/viral pneumonia. Recommend correlation with COVID status. There are bilateral pleural effusions somewhat atypical with COVID pneumonia. I do not see any findings suspicious for septic emboli and there is no focal airspace consolidation or worrisome pulmonary lesions. Upper Abdomen: No significant upper abdominal findings.  Musculoskeletal: No significant bony findings. Mild wedging of some of the thoracic vertebral bodies and Schmorl's nodes which could suggest Scheuermann's disease. I do not see any findings worrisome for discitis, osteomyelitis or septic arthritis. Review of the MIP images confirms the above findings. IMPRESSION: 1. No CT findings for pulmonary embolism. 2. Mild pulmonary artery enlargement could suggest pulmonary hypertension. 3. Normal thoracic aorta. 4. Patchy ill-defined areas of faint ground-glass opacity in both lungs but most significant in the left lower lobe. Findings could reflect asymmetric pulmonary edema but more likely atypical/viral pneumonia. Recommend correlation with COVID status. 5. Bilateral pleural effusions, somewhat atypical with COVID pneumonia. 6. Mediastinal, hilar and axillary adenopathy, possibly reactive/inflammatory. 7. No findings worrisome for discitis, osteomyelitis or septic arthritis. Electronically Signed   By: PMarijo SanesM.D.   On: 08/21/2019 13:41   DG CHEST PORT 1 VIEW  Result Date: 09/04/2019 CLINICAL DATA:  39 year old male with shortness of breath. EXAM: PORTABLE CHEST 1 VIEW COMPARISON:  Portable chest 08/27/2019 and earlier. FINDINGS: Portable AP semi upright view at 0534 hours. Lower lung volumes. Persistent small pleural effusions. No pneumothorax. Ongoing asymmetric bilateral pulmonary interstitial opacity greater on the right. Left perihilar ventilation appears improved. No areas of consolidation. Mediastinal contours remain normal. Negative visible bowel gas pattern, osseous structures. IMPRESSION: 1. Lower lung volumes.  Continued small pleural effusions. 2. Ongoing asymmetric pulmonary interstitial opacity, with regressed left perihilar involvement from 08/27/2019. This remains nonspecific with main differential considerations of asymmetric edema, viral/atypical infection, and noninfectious pulmonary inflammation (perhaps Pulmonary Talcosis in the setting of  IVDA). Electronically Signed   By: Genevie Ann M.D.   On: 09/04/2019 08:54   DG Chest Port 1 View  Result Date: 08/27/2019 CLINICAL DATA:  Shortness of breath and tachycardia. EXAM: PORTABLE CHEST 1 VIEW COMPARISON:  08/20/2019 prior exams FINDINGS: The cardiopericardial silhouette is unchanged. Pulmonary vascular congestion with mild interstitial edema noted. There is no evidence of focal airspace disease, suspicious pulmonary nodule/mass, pleural effusion, or pneumothorax. No acute bony abnormalities are identified. IMPRESSION: Pulmonary vascular congestion with mild interstitial edema. Electronically Signed   By: Margarette Canada M.D.   On: 08/27/2019 13:35   DG CHEST PORT 1 VIEW  Result Date: 08/20/2019 CLINICAL DATA:  Short of breath, central and left-sided chest pain for several hours EXAM: PORTABLE CHEST 1 VIEW COMPARISON:  08/19/2019 FINDINGS: Single frontal view of the chest demonstrates stable cardiac silhouette. Since the prior exam, bilateral perihilar airspace disease has developed. No significant effusion or pneumothorax. No acute bony abnormalities. IMPRESSION: 1. Bilateral perihilar airspace disease most consistent with pulmonary edema given rapid development since yesterday. Electronically Signed   By: Randa Ngo M.D.   On: 08/20/2019 23:25   ECHOCARDIOGRAM COMPLETE  Result Date: 08/28/2019    ECHOCARDIOGRAM REPORT   Patient Name:   Luke [name] Date of Exam: 08/28/2019 Medical Rec #:  [id number]    Height:       69.0 in Accession #:    [id number]   Weight:       164.1 lb Date of Birth:  12/28/1980    BSA:          1.899 m Patient Age:    37 years     BP:           117/74 mmHg Patient Gender: M            HR:           84 bpm. Exam Location:  Inpatient Procedure: 2D Echo, Cardiac Doppler and Color Doppler Indications:    Endocarditis I38  History:        Patient has prior history of Echocardiogram examinations, most                 recent 05/12/2019. CHF, Signs/Symptoms:Chest Pain; Risk                  Factors:Diabetes and Current Smoker. Endocarditis. Elevated                 troponin. GERD. IV drug abuse.  Sonographer:    Vickie Epley RDCS Referring Phys: [place]  1. Mitral and pulmonic valve vegetations. Endocarditis.  2. Left ventricular ejection fraction, by estimation, is 70 to 75%. The left ventricle has hyperdynamic function. The left ventricle has no regional wall motion abnormalities. Left ventricular diastolic parameters were normal.  3. Right ventricular systolic function is normal. The right ventricular size is normal.  4. There is a anterior mitral leaflet valvular vegetation (1.1 x 0.6 cm). The mitral valve is abnormal. Moderate mitral valve regurgitation. No evidence of mitral stenosis.  5. The aortic valve is normal in structure. Aortic valve regurgitation is mild. No aortic stenosis is present.  6. Possible vegetation on pulmonic valve. The pulmonic valve was abnormal.  7. The inferior vena cava is normal in size with greater than 50% respiratory variability, suggesting right atrial pressure of 3 mmHg. FINDINGS  Left Ventricle: Left ventricular ejection fraction, by estimation, is 70 to 75%. The left ventricle has hyperdynamic function. The left ventricle has no regional wall motion abnormalities. The left ventricular internal cavity size was normal in size. There is no left ventricular hypertrophy. Left ventricular diastolic parameters were normal. Right Ventricle: The right ventricular size is normal. No increase in right ventricular wall thickness. Right ventricular systolic function is normal. Left Atrium: Left atrial size was normal in size. Right Atrium: Right atrial size was normal in size. Pericardium: There is no evidence of pericardial effusion. Mitral Valve: There is a anterior mitral leaflet valvular vegetation (1.1 x 0.6 cm). The mitral valve is abnormal. There is mild thickening of the mitral valve leaflet(s). Normal mobility of the mitral valve  leaflets. Moderate mitral valve regurgitation,  with eccentric laterally directed jet. No evidence of mitral valve stenosis. Tricuspid Valve: The tricuspid valve is normal in structure. Tricuspid valve regurgitation is not demonstrated. No evidence of tricuspid stenosis. Aortic Valve: The aortic valve is normal in structure. Aortic valve regurgitation is mild. Aortic regurgitation PHT measures 390 msec. No aortic stenosis is present. Pulmonic Valve: Possible vegetation on pulmonic valve. The pulmonic valve was abnormal. Pulmonic valve regurgitation is trivial. No evidence of pulmonic stenosis. Aorta: The aortic root is normal in size and structure. Venous: The inferior vena cava is normal in size with greater than 50% respiratory variability, suggesting right atrial pressure of 3 mmHg. IAS/Shunts: No atrial level shunt detected by color flow Doppler. Additional Comments: Mitral and pulmonic valve vegetations. Endocarditis.  LEFT VENTRICLE PLAX 2D LVIDd:         5.30 cm  Diastology LVIDs:         3.40 cm  LV e' lateral: 11.00 cm/s LV PW:         0.80 cm  LV e' medial:  7.83 cm/s LV IVS:        0.80 cm LVOT diam:     1.90 cm LV SV:         53 LV SV Index:   28 LVOT Area:     2.84 cm  RIGHT VENTRICLE RV S prime:     12.80 cm/s TAPSE (M-mode): 1.8 cm LEFT ATRIUM             Index       RIGHT ATRIUM          Index LA diam:        5.00 cm 2.63 cm/m  RA Area:     7.22 cm LA Vol (A2C):   61.3 ml 32.27 ml/m RA Volume:   10.40 ml 5.48 ml/m LA Vol (A4C):   58.0 ml 30.54 ml/m LA Biplane Vol: 59.7 ml 31.43 ml/m  AORTIC VALVE LVOT Vmax:   99.80 cm/s LVOT Vmean:  68.000 cm/s LVOT VTI:    0.188 m AI PHT:      390 msec  AORTA Ao Root diam: 2.80  cm MR Peak grad: 100.0 mmHg MR Mean grad: 69.0 mmHg   SHUNTS MR Vmax:      500.00 cm/s Systemic VTI:  0.19 m MR Vmean:     386.0 cm/s  Systemic Diam: 1.90 cm Candee Furbish MD Electronically signed by Candee Furbish MD Signature Date/Time: 08/28/2019/12:20:14 PM    Final    [place] EKG SITE  RITE  Result Date: 08/28/2019 If Site Rite image not attached, placement could not be confirmed due to current cardiac rhythm.  [place] EKG SITE RITE  Result Date: 08/20/2019 If Site Rite image not attached, placement could not be confirmed due to current cardiac rhythm.   (Echo, Carotid, EGD, Colonoscopy, ERCP)    Subjective:  Patient resting in bed hard to keep his eyes open  Falls back to sleep while speaking with me.  He is still asking to increase his Dilaudid Discharge Exam: Vitals:   09/09/19 2129 09/10/19 0528  BP: 137/87 (!) 145/74  Pulse: (!) 109 (!) 109  Resp: 19 19  Temp: 98.4 F (36.9 C) 98.8 F (37.1 C)  SpO2: 92% 93%   Vitals:   09/09/19 0331 09/09/19 1154 09/09/19 2129 09/10/19 0528  BP: 128/79 127/82 137/87 (!) 145/74  Pulse: (!) 101 (!) 101 (!) 109 (!) 109  Resp: '18 18 19 19  '$ Temp: 99 F (37.2 C) 98.9 F (37.2 C) 98.4 F (36.9 C) 98.8 F (37.1 C)  TempSrc: Oral Oral Oral Oral  SpO2: 96% 100% 92% 93%  Weight: 77.2 kg   76.5 kg  Height:        General: Pt is very sleepy  Cardiovascular: RRR, S1/S2 +, no rubs, no gallops Respiratory: CTA bilaterally, no wheezing, no rhonchi Abdominal: Soft, NT, ND, bowel sounds + Extremities: no edema, no cyanosis    The results of significant diagnostics from this hospitalization (including imaging, microbiology, ancillary and laboratory) are listed below for reference.     Microbiology: No results found for this or any previous visit (from the past 240 hour(s)).   Labs: BNP (last 3 results) Recent Labs    08/27/19 1415  BNP 810.1*   Basic Metabolic Panel: Recent Labs  Lab 09/06/19 0631 09/09/19 1943  NA 136 136  K 3.6 3.8  CL 105 106  CO2 23 18*  GLUCOSE 193* 328*  BUN 25* 25*  CREATININE 1.26* 1.23  CALCIUM 8.5* 8.7*   Liver Function Tests: No results for input(s): AST, ALT, ALKPHOS, BILITOT, PROT, ALBUMIN in the last 168 hours. No results for input(s): LIPASE, AMYLASE in the last 168  hours. No results for input(s): AMMONIA in the last 168 hours. CBC: Recent Labs  Lab 09/10/19 0430  WBC 12.2*  HGB 8.7*  HCT 27.0*  MCV 81.6  PLT 401*   Cardiac Enzymes: No results for input(s): CKTOTAL, CKMB, CKMBINDEX, TROPONINI in the last 168 hours. BNP: Invalid input(s): POCBNP CBG: Recent Labs  Lab 09/09/19 1642 09/09/19 2125 09/10/19 0616 09/10/19 1036 09/10/19 1140  GLUCAP 201* 435* 169* 54* 171*   D-Dimer No results for input(s): DDIMER in the last 72 hours. Hgb A1c No results for input(s): HGBA1C in the last 72 hours. Lipid Profile No results for input(s): CHOL, HDL, LDLCALC, TRIG, CHOLHDL, LDLDIRECT in the last 72 hours. Thyroid function studies No results for input(s): TSH, T4TOTAL, T3FREE, THYROIDAB in the last 72 hours.  Invalid input(s): FREET3 Anemia work up No results for input(s): VITAMINB12, FOLATE, FERRITIN, TIBC, IRON, RETICCTPCT in the last 72 hours. Urinalysis    Component  Value Date/Time   COLORURINE YELLOW 08/20/2019 0504   APPEARANCEUR CLEAR 08/20/2019 0504   APPEARANCEUR Clear 04/11/2014 0138   LABSPEC 1.026 08/20/2019 0504   LABSPEC 1.026 04/11/2014 0138   PHURINE 6.0 08/20/2019 0504   GLUCOSEU >=500 (A) 08/20/2019 0504   GLUCOSEU >=500 04/11/2014 0138   HGBUR SMALL (A) 08/20/2019 0504   BILIRUBINUR NEGATIVE 08/20/2019 0504   BILIRUBINUR negative 03/24/2018 1032   BILIRUBINUR neg 02/18/2017 0946   BILIRUBINUR Negative 04/11/2014 0138   KETONESUR NEGATIVE 08/20/2019 0504   PROTEINUR >=300 (A) 08/20/2019 0504   UROBILINOGEN 0.2 03/24/2018 1032   UROBILINOGEN 0.2 12/10/2013 1221   NITRITE NEGATIVE 08/20/2019 0504   LEUKOCYTESUR NEGATIVE 08/20/2019 0504   LEUKOCYTESUR Negative 04/11/2014 0138   Sepsis Labs Invalid input(s): PROCALCITONIN,  WBC,  LACTICIDVEN Microbiology No results found for this or any previous visit (from the past 240 hour(s)).   Time coordinating discharge:  3 78mnutes  SIGNED:   EGeorgette Shell  MD  [facility] 09/10/2019, 2:50 PM Pager   If 7PM-7AM, please contact night-coverage [email or web address] Password [email or web address]

## 2019-09-11 ENCOUNTER — Telehealth: Payer: Self-pay

## 2019-09-11 NOTE — Telephone Encounter (Signed)
Transition Care Management Follow-up Telephone Call Date of discharge and from where: 06/24//2021 [place] pt at [phone] [facility] Phone)   [phone] (Mobile) unable to reach/ Left voice message to call back. Name and phone nr provided.

## 2019-09-14 ENCOUNTER — Other Ambulatory Visit: Payer: Self-pay | Admitting: Nurse Practitioner

## 2019-09-14 ENCOUNTER — Telehealth: Payer: Self-pay

## 2019-09-14 NOTE — Telephone Encounter (Signed)
Transition Care Management Follow-up Telephone Call  Date of discharge and from where: 09/10/2019, [facility]   How have you been since you were released from the hospital? He stated that he is still having some trouble breathing. Reviewed when to return to the ED.  Any questions or concerns?  he is requesting a new glucometer and testing supplies. He is currently out of test strips and said that his glucometer it quite old and he could use a new machine  Items Reviewed:  Did the pt receive and understand the discharge instructions provided?  yes, he said that he has them at home and he was not home at the time of this call.   Medications obtained and verified? he said that he has all medications and would review and call this CM back when he gets home if he has questions.  He noted that his wife is an Therapist, sports  Any new allergies since your discharge? none reported   Do you have support at home?  yes  Other (ie: DME, Home Health, etc) no home health or DME ordered.   He said that his wife has been doing his dressing changes. He has some supplies but will need to obtain more  Functional Questionnaire: (I = Independent and D = Dependent) ADL's: independent   Follow up appointments reviewed:    PCP Hospital f/u appt confirmed?Ms Raul Del, NP  09/18/2019 @ [facility] f/u appt confirmed?   ID - 09/23/2019.   Are transportation arrangements needed?  No, he said he has transportation  If their condition worsens, is the pt aware to call  their PCP or go to the ED? yes  Was the patient provided with contact information for the PCP's office or ED?  he has the phone number for the clinic  Was the pt encouraged to call back with questions or concerns? yes

## 2019-09-15 ENCOUNTER — Emergency Department (HOSPITAL_COMMUNITY): Payer: Medicaid Other

## 2019-09-15 ENCOUNTER — Encounter (HOSPITAL_COMMUNITY): Payer: Self-pay

## 2019-09-15 ENCOUNTER — Other Ambulatory Visit: Payer: Self-pay

## 2019-09-15 ENCOUNTER — Emergency Department (HOSPITAL_COMMUNITY)
Admission: EM | Admit: 2019-09-15 | Discharge: 2019-09-15 | Discharge status: Against Medical Advice | Payer: Medicaid Other | Attending: Emergency Medicine | Admitting: Emergency Medicine

## 2019-09-15 DIAGNOSIS — R0602 Shortness of breath: Secondary | ICD-10-CM | POA: Diagnosis present

## 2019-09-15 DIAGNOSIS — Z5321 Procedure and treatment not carried out due to patient leaving prior to being seen by health care provider: Secondary | ICD-10-CM | POA: Insufficient documentation

## 2019-09-15 LAB — URINALYSIS, ROUTINE W REFLEX MICROSCOPIC
Bacteria, UA: NONE SEEN
Bilirubin Urine: NEGATIVE
Glucose, UA: >=500 mg/dL — AB
Ketones, ur: NEGATIVE mg/dL
Leukocytes,Ua: NEGATIVE
Nitrite: NEGATIVE
Protein, ur: 30 mg/dL — AB
Specific Gravity, Urine: 1.021 (ref 1.005–1.030)
pH: 6 (ref 5.0–8.0)

## 2019-09-15 LAB — CBC
HCT: 28.5 % — ABNORMAL LOW (ref 39.0–52.0)
Hemoglobin: 9.4 g/dL — ABNORMAL LOW (ref 13.0–17.0)
MCH: 26.3 pg (ref 26.0–34.0)
MCHC: 33 g/dL (ref 30.0–36.0)
MCV: 79.8 fL — ABNORMAL LOW (ref 80.0–100.0)
Platelets: 499 10*3/uL — ABNORMAL HIGH (ref 150–400)
RBC: 3.57 MIL/uL — ABNORMAL LOW (ref 4.22–5.81)
RDW: 14.2 % (ref 11.5–15.5)
WBC: 12.9 10*3/uL — ABNORMAL HIGH (ref 4.0–10.5)
nRBC: 0 % (ref 0.0–0.2)

## 2019-09-15 LAB — BASIC METABOLIC PANEL
Anion gap: 14 (ref 5–15)
BUN: 22 mg/dL — ABNORMAL HIGH (ref 6–20)
CO2: 17 mmol/L — ABNORMAL LOW (ref 22–32)
Calcium: 8.9 mg/dL (ref 8.9–10.3)
Chloride: 102 mmol/L (ref 98–111)
Creatinine, Ser: 1.43 mg/dL — ABNORMAL HIGH (ref 0.61–1.24)
GFR calc Af Amer: >60 mL/min (ref >60)
GFR calc non Af Amer: >60 mL/min (ref >60)
Glucose, Bld: 544 mg/dL (ref 70–99)
Potassium: 3.8 mmol/L (ref 3.5–5.1)
Sodium: 133 mmol/L — ABNORMAL LOW (ref 135–145)

## 2019-09-15 LAB — LACTIC ACID, PLASMA: Lactic Acid, Venous: 1.5 mmol/L (ref 0.5–1.9)

## 2019-09-15 LAB — TROPONIN I (HIGH SENSITIVITY): Troponin I (High Sensitivity): 20 ng/L — ABNORMAL HIGH (ref <18)

## 2019-09-15 MED ORDER — SODIUM CHLORIDE 0.9% FLUSH: 3.0000 mL | Freq: Once | INTRAVENOUS | Status: DC

## 2019-09-15 NOTE — ED Notes (Signed)
Called pt name to be roomed x3. No response from pt.

## 2019-09-15 NOTE — ED Triage Notes (Signed)
Pt c/o of SOB that has been going on since he was discharged on Friday with endocarditis. C/o of back and CP

## 2019-09-18 ENCOUNTER — Encounter: Payer: Self-pay | Admitting: Nurse Practitioner

## 2019-09-18 ENCOUNTER — Other Ambulatory Visit: Payer: Self-pay | Admitting: Nurse Practitioner

## 2019-09-18 ENCOUNTER — Other Ambulatory Visit: Payer: Self-pay

## 2019-09-18 ENCOUNTER — Ambulatory Visit: Payer: Medicaid Other | Attending: Nurse Practitioner | Admitting: Nurse Practitioner

## 2019-09-18 DIAGNOSIS — N183 Chronic kidney disease, stage 3 unspecified: Secondary | ICD-10-CM | POA: Insufficient documentation

## 2019-09-18 DIAGNOSIS — Z833 Family history of diabetes mellitus: Secondary | ICD-10-CM | POA: Diagnosis not present

## 2019-09-18 DIAGNOSIS — Z794 Long term (current) use of insulin: Secondary | ICD-10-CM | POA: Insufficient documentation

## 2019-09-18 DIAGNOSIS — I130 Hypertensive heart and chronic kidney disease with heart failure and stage 1 through stage 4 chronic kidney disease, or unspecified chronic kidney disease: Secondary | ICD-10-CM | POA: Insufficient documentation

## 2019-09-18 DIAGNOSIS — Z79890 Hormone replacement therapy (postmenopausal): Secondary | ICD-10-CM | POA: Insufficient documentation

## 2019-09-18 DIAGNOSIS — E1165 Type 2 diabetes mellitus with hyperglycemia: Secondary | ICD-10-CM

## 2019-09-18 DIAGNOSIS — Z8249 Family history of ischemic heart disease and other diseases of the circulatory system: Secondary | ICD-10-CM | POA: Insufficient documentation

## 2019-09-18 DIAGNOSIS — B1920 Unspecified viral hepatitis C without hepatic coma: Secondary | ICD-10-CM | POA: Insufficient documentation

## 2019-09-18 DIAGNOSIS — F331 Major depressive disorder, recurrent, moderate: Secondary | ICD-10-CM | POA: Diagnosis not present

## 2019-09-18 DIAGNOSIS — I5021 Acute systolic (congestive) heart failure: Secondary | ICD-10-CM | POA: Diagnosis not present

## 2019-09-18 DIAGNOSIS — IMO0002 Reserved for concepts with insufficient information to code with codable children: Secondary | ICD-10-CM

## 2019-09-18 DIAGNOSIS — Z79899 Other long term (current) drug therapy: Secondary | ICD-10-CM | POA: Insufficient documentation

## 2019-09-18 DIAGNOSIS — K219 Gastro-esophageal reflux disease without esophagitis: Secondary | ICD-10-CM | POA: Insufficient documentation

## 2019-09-18 DIAGNOSIS — E1051 Type 1 diabetes mellitus with diabetic peripheral angiopathy without gangrene: Secondary | ICD-10-CM | POA: Insufficient documentation

## 2019-09-18 DIAGNOSIS — D649 Anemia, unspecified: Secondary | ICD-10-CM | POA: Diagnosis not present

## 2019-09-18 DIAGNOSIS — Z09 Encounter for follow-up examination after completed treatment for conditions other than malignant neoplasm: Secondary | ICD-10-CM

## 2019-09-18 DIAGNOSIS — E039 Hypothyroidism, unspecified: Secondary | ICD-10-CM | POA: Insufficient documentation

## 2019-09-18 DIAGNOSIS — E785 Hyperlipidemia, unspecified: Secondary | ICD-10-CM | POA: Diagnosis not present

## 2019-09-18 DIAGNOSIS — R0602 Shortness of breath: Secondary | ICD-10-CM | POA: Diagnosis not present

## 2019-09-18 DIAGNOSIS — E1065 Type 1 diabetes mellitus with hyperglycemia: Secondary | ICD-10-CM | POA: Diagnosis not present

## 2019-09-18 DIAGNOSIS — F419 Anxiety disorder, unspecified: Secondary | ICD-10-CM | POA: Insufficient documentation

## 2019-09-18 DIAGNOSIS — Z8679 Personal history of other diseases of the circulatory system: Secondary | ICD-10-CM

## 2019-09-18 DIAGNOSIS — E1022 Type 1 diabetes mellitus with diabetic chronic kidney disease: Secondary | ICD-10-CM | POA: Insufficient documentation

## 2019-09-18 DIAGNOSIS — Z89412 Acquired absence of left great toe: Secondary | ICD-10-CM | POA: Insufficient documentation

## 2019-09-18 MED ORDER — SERTRALINE HCL 50 MG PO TABS: 50.0000 mg | ORAL_TABLET | Freq: Every day | ORAL | 2 refills | Status: DC

## 2019-09-18 MED ORDER — CARVEDILOL 6.25 MG PO TABS: 6.2500 mg | ORAL_TABLET | Freq: Two times a day (BID) | ORAL | 1 refills | Status: DC

## 2019-09-18 MED ORDER — ATORVASTATIN CALCIUM 20 MG PO TABS: 20.0000 mg | ORAL_TABLET | Freq: Every day | ORAL | 3 refills | Status: DC

## 2019-09-18 MED ORDER — "PAPER TAPE 1""X10YD TAPE": MEDICATED_TAPE | 2 refills | Status: DC

## 2019-09-18 MED ORDER — LEVOTHYROXINE SODIUM 50 MCG PO TABS: 50.0000 ug | ORAL_TABLET | Freq: Every day | ORAL | 0 refills | Status: DC

## 2019-09-18 MED ORDER — TRUEPLUS LANCETS 28G MISC: 12 refills | Status: DC

## 2019-09-18 MED ORDER — FUROSEMIDE 20 MG PO TABS: 20.0000 mg | ORAL_TABLET | Freq: Every day | ORAL | 0 refills | Status: DC

## 2019-09-18 MED ORDER — TRAZODONE HCL 50 MG PO TABS: 50.0000 mg | ORAL_TABLET | Freq: Every day | ORAL | 2 refills | Status: DC

## 2019-09-18 MED ORDER — DULOXETINE HCL 30 MG PO CPEP: 30.0000 mg | ORAL_CAPSULE | Freq: Every day | ORAL | 0 refills | Status: DC

## 2019-09-18 MED ORDER — "XEROFORM PETROLATUM ROLL 4""X9' EX MISC": CUTANEOUS | 1 refills | Status: DC

## 2019-09-18 MED ORDER — OMEPRAZOLE 20 MG PO CPDR: 20.0000 mg | DELAYED_RELEASE_CAPSULE | Freq: Every day | ORAL | 3 refills | Status: DC

## 2019-09-18 MED ORDER — GLUCOSE BLOOD VI STRP: ORAL_STRIP | 12 refills | Status: DC

## 2019-09-18 MED ORDER — INSULIN LISPRO (1 UNIT DIAL) 100 UNIT/ML (KWIKPEN): 4.0000 [IU] | PEN_INJECTOR | Freq: Three times a day (TID) | SUBCUTANEOUS | 6 refills | Status: DC

## 2019-09-18 MED ORDER — ALBUTEROL SULFATE HFA 108 (90 BASE) MCG/ACT IN AERS: 2.0000 | INHALATION_SPRAY | Freq: Four times a day (QID) | RESPIRATORY_TRACT | 1 refills | Status: DC | PRN

## 2019-09-18 MED ORDER — FERROUS SULFATE 325 (65 FE) MG PO TABS: 325.0000 mg | ORAL_TABLET | Freq: Every day | ORAL | 3 refills | Status: DC

## 2019-09-18 MED ORDER — KERLIX BANDAGE ROLL MISC: 1 refills | Status: DC

## 2019-09-18 MED FILL — PROAIR HFA 90 MCG INHALER: 108 (90 BAS | 25 days supply | Qty: 9 | Fill #0

## 2019-09-18 MED FILL — FUROSEMIDE 20 MG TABS: 20 | 14 days supply | Qty: 14 | Fill #0

## 2019-09-18 MED FILL — LEVOTHYROXINE SODIUM 50 MCG: 50 | 90 days supply | Qty: 90 | Fill #0

## 2019-09-18 MED FILL — SERTRALINE HCL 50 MG TABLET: 50 | 90 days supply | Qty: 90 | Fill #0

## 2019-09-18 MED FILL — HUMALOG 100 UNITS/ML KWIKPE: 100 | 25 days supply | Qty: 15 | Fill #0

## 2019-09-18 MED FILL — OMEPRAZOLE 20 MG CAP: 20 | 90 days supply | Qty: 90 | Fill #0

## 2019-09-18 MED FILL — ACCU-CHEK GUIDE TEST STRIP: 50 days supply | Qty: 100 | Fill #0

## 2019-09-18 NOTE — Progress Notes (Signed)
Assessment & Plan:  Luke [name] was seen today for hospitalization follow-up.  Diagnoses and all orders for this visit:  Hospital discharge follow-up -     Bismuth Tribromoph-Petrolatum (XEROFORM PETROLATUM ROLL 4"X9') MISC; Apply topically to affected area as prescribed -     Gauze Pads & Dressings (KERLIX BANDAGE ROLL) MISC; Apply to affected area as prescribed -     Adhesive Tape (PAPER TAPE 1"X10YD) TAPE; Use as instructed  Insulin dependent type 2 diabetes mellitus, uncontrolled ([name]) -     glucose blood test strip; Use as instructed. Check blood glucose level by fingerstick twice per day. E11.65 -     DULoxetine (CYMBALTA) 30 MG capsule; Take 1 capsule (30 mg total) by mouth daily. -     TRUEplus Lancets 28G MISC; Use as instructed. Check blood glucose level by fingerstick twice per day. E11.65 -     Ambulatory referral to Endocrinology Continue blood sugar control as discussed in office today, low carbohydrate diet, and regular physical exercise as tolerated, 150 minutes per week (30 min each day, 5 days per week, or 50 min 3 days per week). Keep blood sugar logs with fasting goal of 90-130 mg/dl, post prandial (after you eat) less than 180.  For Hypoglycemia: BS <60 and Hyperglycemia BS >400; contact the clinic ASAP. Annual eye exams and foot exams are recommended.   Dyslipidemia, goal LDL below 70 -     atorvastatin (LIPITOR) 20 MG tablet; Take 1 tablet (20 mg total) by mouth daily. INSTRUCTIONS: Work on a low fat, heart healthy diet and participate in regular aerobic exercise program by working out at least 150 minutes per week; 5 days a week-30 minutes per day. Avoid red meat/beef/steak,  fried foods. junk foods, sodas, sugary drinks, unhealthy snacking, alcohol and smoking.  Drink at least 80 oz of water per day and monitor your carbohydrate intake daily.    Shortness of breath at rest -     albuterol (VENTOLIN HFA) 108 (90 Base) MCG/ACT inhaler; Inhale 2 puffs into the lungs every  6 (six) hours as needed for wheezing or shortness of breath. -     insulin lispro (HUMALOG KWIKPEN) 100 UNIT/ML KwikPen; Inject 0.04-0.2 mLs (4-20 Units total) into the skin 3 (three) times daily. Per sliding scale -     Ambulatory referral to Cardiology  History of CHF (congestive heart failure) -     furosemide (LASIX) 20 MG tablet; Take 1 tablet (20 mg total) by mouth daily for 14 days. -     carvedilol (COREG) 6.25 MG tablet; Take 1 tablet (6.25 mg total) by mouth 2 (two) times daily with a meal. -     Ambulatory referral to Cardiology  Hypothyroidism, unspecified type -     levothyroxine (SYNTHROID) 50 MCG tablet; Take 1 tablet (50 mcg total) by mouth daily.  Moderate episode of recurrent major depressive disorder ([name]) -     DULoxetine (CYMBALTA) 30 MG capsule; Take 1 capsule (30 mg total) by mouth daily. -     traZODone (DESYREL) 50 MG tablet; Take 1 tablet (50 mg total) by mouth at bedtime. -     sertraline (ZOLOFT) 50 MG tablet; Take 1 tablet (50 mg total) by mouth daily.  Gastroesophageal reflux disease without esophagitis -     omeprazole (PRILOSEC) 20 MG capsule; Take 1 capsule (20 mg total) by mouth daily. INSTRUCTIONS: Avoid GERD Triggers: acidic, spicy or fried foods, caffeine, coffee, sodas,  alcohol and chocolate.    Anemia, unspecified type -  ferrous sulfate 325 (65 FE) MG tablet; Take 1 tablet (325 mg total) by mouth daily with breakfast.    Patient has been counseled on age-appropriate routine health concerns for screening and prevention. These are reviewed and up-to-date. Referrals have been placed accordingly. Immunizations are up-to-date or declined.    Subjective:   Chief Complaint  Patient presents with  . Hospitalization Follow-up    Pt. is here for HFU.    HPIVirtual Visit via Telephone Note Due to national recommendations of social distancing due to [place] 19, telehealth visit is felt to be most appropriate for this patient at this time. I discussed  the limitations, risks, security and privacy concerns of performing an evaluation and management service by telephone and the availability of in person appointments. I also discussed with the patient that there may be a patient responsible charge related to this service. The patient expressed understanding and agreed to proceed.    I connected with Luke [name] on 09/18/19  at  10:10 AM EDT  EDT by telephoneand verified that I am speaking with the correct person using two identifiers.  Consent I discussed the limitations, risks, security and privacy concerns of performing an evaluation and management service by telephone and the availability of in person appointments. I also discussed with the patient that there may be a patient responsible charge related to this service. The patient expressed understanding and agreed to proceed.   Location of Patient: Private Residence  Location of Provider: [facility] [employer] [facility]   Persons participating in Telemedicine visit: Geryl Rankins FNP-BC [place]   History of Present Illness: Telemedicine visit for: HFU  Luke [name] 39 y.o. male presents to office today for hospital follow up. He states he had returned to the ED recently and is very upset at how long he had to wait and he decided to leave.  Past medical history is significant for IDDM,  IV drug abuse, endocarditis of pulmonic valve, CKD stage III, acute systolic heart failure, peripheral vascular disease, left great toe amputation, hypothyroidism, hypertension, left arm cellulitis, polysubstance abuse (heroin) and seizures.   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  Currently being followed by plastic surgery.   DM  Glucose readings needed 200s.  Currently taking 40 units of Basaglar twice daily, Humalog 5 units after each meal and SSI 4-20 units as instructed. I am referring him to  endocrinology for tighter management of his DM.   Lab Results  Component Value Date   HGBA1C 10.8 (H) 08/27/2019  Amlodipine was discontinued in the hospital due to lower blood pressure readings BP Readings from Last 3 Encounters:  09/15/19 113/75  09/10/19 (!) 145/74  08/21/19 (!) 159/82  LDL is not at goal despite his endorsement of taking atorvastatin 20 mg daily. Lab Results  Component Value Date   LDLCALC 156 (H) 10/21/2017   CHF Endorses shortness of breath which is worse with lying down. Feels as if he is smothering. Shortness of breath improved with sitting up while he sleeps. He denies any BLE edema. WIll start low dose of lasix and evaluate in 1-2 weeks.   Hypothyroidism Well-controlled on levothyroxine 50 mg daily. Lab Results  Component Value Date   TSH 1.190 06/23/2019     Past Medical History:  Diagnosis Date  . Anemia   . Anxiety  Dx 2008  . Cellulitis   . Chronic kidney disease   .  Diabetes mellitus without complication ([place]) Dx 9628  . DKA (diabetic ketoacidoses) ([place]) 07/21/2017  . Endocarditis 04/2019  . Endocarditis   . GERD (gastroesophageal reflux disease) Dx 2008  . Headache(784.0)   . Heart murmur   . Hepatitis C   . Hypertension   . Hypothyroidism   . MRSA infection   . Pneumonia   . Seizures ([place]) 2011    x 2 in lifetime. on Dilantin for a while.   . Sepsis ([place])   . Substance abuse ([place]) 2013    heroin use, multiple relapses  . Type 1 diabetes [facility])     Past Surgical History:  Procedure Laterality Date  . AMPUTATION Left 09/05/2018   Procedure: LEFT GREAT TOE AMPUTATION;  Surgeon: [name], MD;  Location: [facility];  Service: Orthopedics;  Laterality: Left;  . APPLICATION OF A-CELL OF EXTREMITY Bilateral 09/01/2019   Procedure: APPLICATION OF MESHED PRIMATRIX AG OF EXTREMITY;  Surgeon: Cindra Presume, MD;  Location: [facility];  Service: Plastics;  Laterality: Bilateral;  . I & D EXTREMITY Left 10/11/2012   Procedure: IRRIGATION AND  DEBRIDEMENT ABSCESS FOREARM;  Surgeon: Linna Hoff, MD;  Location: [facility];  Service: Orthopedics;  Laterality: Left;  . I & D EXTREMITY Left 10/12/2012   Procedure: IRRIGATION AND DEBRIDEMENT FOREARM;  Surgeon: Linna Hoff, MD;  Location: [facility];  Service: Orthopedics;  Laterality: Left;  . I & D EXTREMITY Left 10/14/2012   Procedure: incision and drainage left forearm;  Surgeon: Linna Hoff, MD;  Location: [facility];  Service: Orthopedics;  Laterality: Left;  . I & D EXTREMITY Left 10/16/2012   Procedure: IRRIGATION AND DEBRIDEMENT LEFT FOREARM;  Surgeon: Linna Hoff, MD;  Location: [facility];  Service: Orthopedics;  Laterality: Left;  . I & D EXTREMITY Left 10/20/2012   Procedure: INCISION AND DRAINAGE AND DEBRIDEMENT LEFT  FOREARM;  Surgeon: Linna Hoff, MD;  Location: [facility];  Service: Orthopedics;  Laterality: Left;  . I & D EXTREMITY Right 05/22/2019   Procedure: IRRIGATION AND DEBRIDEMENT RIGHT ARM;  Surgeon: [name], MD;  Location: [facility];  Service: Orthopedics;  Laterality: Right;  . INCISION AND DRAINAGE OF WOUND Bilateral 11/29/2017   Procedure: DEBRIDEMENT BILATERAL FEET, DEBRIDEMENT LEFT ANKLE, AND APPLY WOUND VAC;  Surgeon: [name], MD;  Location: [facility];  Service: Orthopedics;  Laterality: Bilateral;  . INCISION AND DRAINAGE OF WOUND Bilateral 09/01/2019   Procedure: IRRIGATION AND DEBRIDEMENT FOREARM WOUNDS;  Surgeon: Cindra Presume, MD;  Location: [facility];  Service: Plastics;  Laterality: Bilateral;  . IRRIGATION AND DEBRIDEMENT ABSCESS     Hx: of left arm abscess related to drug use   . TEE WITHOUT CARDIOVERSION N/A 11/28/2017   Procedure: TRANSESOPHAGEAL ECHOCARDIOGRAM (TEE);  Surgeon: Sanda Klein, MD;  Location: [facility];  Service: Cardiovascular;  Laterality: N/A;  . TEE WITHOUT CARDIOVERSION N/A 05/12/2019   Procedure: TRANSESOPHAGEAL ECHOCARDIOGRAM (TEE);  Surgeon: Skeet Latch, MD;  [facility] ENDOSCOPY;  Service: Cardiovascular;  Laterality: N/A;     Family History  Problem Relation Age of Onset  . Diabetes Father   . Heart disease Father   . Mental illness Sister   . Cancer Neg Hx     Social History Reviewed with no changes to be made today.   Outpatient Medications Prior to Visit  Medication Sig Dispense Refill  . Blood Glucose Monitoring Suppl (TRUE METRIX METER) DEVI 1 kit by Does not apply route as directed. Use as  directed 1 Device 0  . Insulin Glargine (BASAGLAR KWIKPEN) 100 UNIT/ML Inject 40 Units into the skin 2 (two) times daily.    Marland Kitchen levofloxacin (LEVAQUIN) 750 MG tablet Take 1 tablet (750 mg total) by mouth daily. 14 tablet 0  . Melatonin 5 MG CHEW Chew 5 mg by mouth daily as needed (sleep).     Marland Kitchen atorvastatin (LIPITOR) 20 MG tablet Take 1 tablet (20 mg total) by mouth daily. 90 tablet 3  . carvedilol (COREG) 6.25 MG tablet Take 1 tablet (6.25 mg total) by mouth 2 (two) times daily with a meal. 60 tablet 0  . ferrous sulfate 325 (65 FE) MG tablet Take 1 tablet (325 mg total) by mouth daily with breakfast. 60 tablet 3  . insulin lispro (HUMALOG KWIKPEN) 100 UNIT/ML KwikPen Inject 0.04-0.2 mLs (4-20 Units total) into the skin 3 (three) times daily. Per sliding scale (Patient taking differently: Inject 5-15 Units into the skin 3 (three) times daily. Per sliding scale) 15 mL 6  . Insulin Syringe-Needle U-100 (TRUEPLUS INSULIN SYRINGE) 31G X 5/16" 0.5 ML MISC USE AS DIRECTED. (Patient taking differently: 1 each by Other route See admin instructions. USE AS DIRECTED.) 100 each 6  . levothyroxine (SYNTHROID) 50 MCG tablet Take 1 tablet (50 mcg total) by mouth daily. 90 tablet 0  . sertraline (ZOLOFT) 50 MG tablet TAKE 1 TABLET (50 MG TOTAL) BY MOUTH DAILY. 30 tablet 2  . traZODone (DESYREL) 50 MG tablet Take 1 tablet (50 mg total) by mouth at bedtime. 30 tablet 2  . TRUEPLUS LANCETS 28G MISC Use as directed (Patient taking differently: 1 each by Other route See admin instructions. Use as directed) 100 each 12  . nicotine  (NICODERM CQ - DOSED IN MG/24 HOURS) 14 mg/24hr patch Place 1 patch (14 mg total) onto the skin daily. (Patient not taking: Reported on 09/18/2019) 28 patch 0  . DULoxetine (CYMBALTA) 30 MG capsule Take 1 capsule (30 mg total) by mouth daily. 90 capsule 0  . insulin glargine (LANTUS SOLOSTAR) 100 UNIT/ML Solostar Pen INJECT 30 UNITS INTO THE SKIN 2 (TWO) TIMES DAILY. (Patient not taking: Reported on 09/18/2019) 15 mL 6  . omeprazole (PRILOSEC) 20 MG capsule Take 1 capsule (20 mg total) by mouth daily. 90 capsule 3   No facility-administered medications prior to visit.    No Known Allergies Review of Systems  Constitutional: Negative for fever, malaise/fatigue and weight loss.  HENT: Negative.  Negative for nosebleeds.   Eyes: Negative.  Negative for blurred vision, double vision and photophobia.  Respiratory: Positive for shortness of breath. Negative for cough.   Cardiovascular: Negative.  Negative for chest pain, palpitations and leg swelling.  Gastrointestinal: Negative.  Negative for heartburn, nausea and vomiting.  Musculoskeletal: Negative.  Negative for myalgias.  Skin:       SEE HPI  Neurological: Negative.  Negative for dizziness, focal weakness, seizures and headaches.  Psychiatric/Behavioral: Positive for depression and substance abuse. Negative for suicidal ideas. The patient is nervous/anxious.       Objective:     Wt Readings from Last 3 Encounters:  09/10/19 168 lb 10.4 oz (76.5 kg)  08/19/19 198 lb 6.6 oz (90 kg)  07/24/19 198 lb (89.8 kg)   The patient was advised to call back or seek an in-person evaluation if the symptoms worsen or if the condition fails to improve as anticipated.  I provided 21 minutes of non-face-to-face time during this encounter including median intraservice time, reviewing previous notes, labs, imaging,  medications and explaining diagnosis and management.  Gildardo Pounds, FNP-BC   Follow-up: Return in about 3 months (around 12/19/2019).

## 2019-09-22 MED FILL — ?BASAGLAR 100 UNITS/ML KWPE: 100 | 25 days supply | Qty: 15 | Fill #2

## 2019-09-23 ENCOUNTER — Other Ambulatory Visit: Payer: Self-pay

## 2019-09-23 ENCOUNTER — Ambulatory Visit (INDEPENDENT_AMBULATORY_CARE_PROVIDER_SITE_OTHER): Payer: Medicaid Other | Admitting: Infectious Diseases

## 2019-09-23 ENCOUNTER — Encounter: Payer: Self-pay | Admitting: Infectious Diseases

## 2019-09-23 VITALS — BP 129/84 | HR 80 | Temp 98.0°F | Ht 69.0 in | Wt 172.0 lb

## 2019-09-23 DIAGNOSIS — I059 Rheumatic mitral valve disease, unspecified: Secondary | ICD-10-CM

## 2019-09-23 DIAGNOSIS — L03119 Cellulitis of unspecified part of limb: Secondary | ICD-10-CM | POA: Diagnosis not present

## 2019-09-23 DIAGNOSIS — B182 Chronic viral hepatitis C: Secondary | ICD-10-CM

## 2019-09-23 DIAGNOSIS — I330 Acute and subacute infective endocarditis: Secondary | ICD-10-CM

## 2019-09-23 DIAGNOSIS — F1110 Opioid abuse, uncomplicated: Secondary | ICD-10-CM | POA: Diagnosis not present

## 2019-09-23 DIAGNOSIS — I38 Endocarditis, valve unspecified: Secondary | ICD-10-CM

## 2019-09-23 DIAGNOSIS — I058 Other rheumatic mitral valve diseases: Secondary | ICD-10-CM | POA: Diagnosis not present

## 2019-09-23 NOTE — Patient Instructions (Addendum)
Very nice to see you today.   Please finish up your last few days of antibiotics.  I would like to see you back in 3-4 weeks to check in again.  Will plan on arranging a repeated heart ultrasound in about 3 months or sooner if cardiology team wants it   Please call Dr. Claudia Desanctis with plastics surgery to follow up with your arms Address: [address], [place], [place] [address] Phone: [phone]  Please have your wife go get hepatitis C antibody testing. She can get this done at the health department if needed for free.

## 2019-09-23 NOTE — Progress Notes (Signed)
[name]  DOB: [birth year] MRN: [id number] PCP: Gildardo Pounds, NP     Patient Active Problem List   Diagnosis Date Noted  . 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    Priority: High  . 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    Priority: High  . Acute bacterial endocarditis     Priority: High  . Hepatitis C 11/25/2017    Priority: Medium  . Congestive heart failure (CHF) ([place]) 08/27/2019  . Chest pain 08/20/2019  . Elevated troponin 08/20/2019  . Abscess of right forearm   . Heroin abuse ([place]) 05/09/2019  . Cellulitis of forearms   . Hypothyroidism   . Diabetic ulcer of left foot ([place]) 08/28/2018  . GERD (gastroesophageal reflux disease) 07/21/2017  . Diabetic polyneuropathy associated with type 1 diabetes mellitus ([place])   . Noncompliant bladder   . Urinary retention   . ARF (acute renal failure) ([place]) 12/16/2016  . Anemia 12/16/2016  . Cocaine abuse ([place]) 08/16/2015  . Uncontrolled insulin dependent type 1 diabetes mellitus ([place]) 08/16/2015  . Cellulitis 12/03/2014  . AKI (acute kidney injury) ([place]) 12/03/2014  . Elevated liver enzymes 07/26/2014  . CKD (chronic kidney disease) stage 3, GFR 30-59 ml/min 10/14/2012  . 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 Tobacco abuse 10/11/2012  . Essential hypertension 10/11/2012     Subjective:  Luke [name] is a 39 y.o. male recently discharged from the hospital on 6/24 after 2 weeks of IV treatment for culture negative endocarditis of the native mitral and pulmonic valve. We had planned to do 3-4 weeks of IV per guidelines however he left against recommendations - for this reason he was given a long acting dose of Oritavancin and sent home with 14 more days of Levaquin --> he has one more dose to complete tomorrow 7/8.   He presented back to the ER with concerns over SOB that has been ongoing since discharge. He left before being seen at this visit. He has met with his PCP a few days ago and working on referral  to cardiology to follow up with shortness of breath and heart failure management. He states it is no worse but it is not improving at all.   The lesions on his arms are healing well s/p debridement in the hospital. He has not seen the plastic surgery team but needs the number to schedule an appointment. His wife Investment banker, corporate) has been changing dressings for him daily. There is no drainage or foul odor. They are still painful but he is managing.   Doing well with regards to sobriety - his wife is a huge support system for him and helping him through this challenging time. He is not on any replacement therapy at this time.   He also has a history of hepatitis c infection that has not had any treatment.    Review of Systems  Constitutional: Negative for chills, fever and malaise/fatigue.  Respiratory: Positive for shortness of breath. Negative for cough and sputum production.   Cardiovascular: Negative for chest pain and leg swelling.  Gastrointestinal: Negative for abdominal pain, diarrhea and vomiting.  Genitourinary: Negative for dysuria.  Musculoskeletal: Positive for joint pain (arm pain r/t incisions). Negative for back pain and myalgias.  Skin: Negative for itching and rash.  Psychiatric/Behavioral: Negative for substance abuse.    Past Medical History:  Diagnosis Date  . Anemia   . Anxiety  Dx 2008  . Cellulitis   . Chronic kidney disease   .  Diabetes mellitus without complication ([place]) Dx 5009  . DKA (diabetic ketoacidoses) ([place]) 07/21/2017  . Endocarditis 04/2019  . Endocarditis   . GERD (gastroesophageal reflux disease) Dx 2008  . Headache(784.0)   . Heart murmur   . Hepatitis C   . Hypertension   . Hypothyroidism   . MRSA infection   . Pneumonia   . Seizures ([place]) 2011    x 2 in lifetime. on Dilantin for a while.   . Sepsis ([place])   . Substance abuse ([place]) 2013    heroin use, multiple relapses  . Type 1 diabetes ([place])     Outpatient Medications Prior to Visit  Medication  Sig Dispense Refill  . Adhesive Tape (PAPER TAPE 1"X10YD) TAPE Use as instructed 2 each 2  . albuterol (VENTOLIN HFA) 108 (90 Base) MCG/ACT inhaler Inhale 2 puffs into the lungs every 6 (six) hours as needed for wheezing or shortness of breath. 18 g 1  . atorvastatin (LIPITOR) 20 MG tablet Take 1 tablet (20 mg total) by mouth daily. 90 tablet 3  . Bismuth Tribromoph-Petrolatum (XEROFORM PETROLATUM ROLL 4"X9') MISC Apply topically to affected area as prescribed 6 each 1  . Blood Glucose Monitoring Suppl (TRUE METRIX METER) DEVI 1 kit by Does not apply route as directed. Use as directed 1 Device 0  . carvedilol (COREG) 6.25 MG tablet Take 1 tablet (6.25 mg total) by mouth 2 (two) times daily with a meal. 180 tablet 1  . DULoxetine (CYMBALTA) 30 MG capsule Take 1 capsule (30 mg total) by mouth daily. 90 capsule 0  . ferrous sulfate 325 (65 FE) MG tablet Take 1 tablet (325 mg total) by mouth daily with breakfast. 60 tablet 3  . furosemide (LASIX) 20 MG tablet Take 1 tablet (20 mg total) by mouth daily for 14 days. 14 tablet 0  . Gauze Pads & Dressings (KERLIX BANDAGE ROLL) MISC Apply to affected area as prescribed 96 each 1  . glucose blood test strip Use as instructed. Check blood glucose level by fingerstick twice per day. E11.65 100 each 12  . Insulin Glargine (BASAGLAR KWIKPEN) 100 UNIT/ML Inject 40 Units into the skin 2 (two) times daily.    . insulin lispro (HUMALOG KWIKPEN) 100 UNIT/ML KwikPen Inject 0.04-0.2 mLs (4-20 Units total) into the skin 3 (three) times daily. Per sliding scale 15 mL 6  . levofloxacin (LEVAQUIN) 750 MG tablet Take 1 tablet (750 mg total) by mouth daily. 14 tablet 0  . levothyroxine (SYNTHROID) 50 MCG tablet Take 1 tablet (50 mcg total) by mouth daily. 90 tablet 0  . Melatonin 5 MG CHEW Chew 5 mg by mouth daily as needed (sleep).     . nicotine (NICODERM CQ - DOSED IN MG/24 HOURS) 14 mg/24hr patch Place 1 patch (14 mg total) onto the skin daily. 28 patch 0  . omeprazole  (PRILOSEC) 20 MG capsule Take 1 capsule (20 mg total) by mouth daily. 90 capsule 3  . sertraline (ZOLOFT) 50 MG tablet Take 1 tablet (50 mg total) by mouth daily. 90 tablet 2  . traZODone (DESYREL) 50 MG tablet Take 1 tablet (50 mg total) by mouth at bedtime. 90 tablet 2  . TRUEplus Lancets 28G MISC Use as instructed. Check blood glucose level by fingerstick twice per day. E11.65 100 each 12   No facility-administered medications prior to visit.     No Known Allergies  Social History   Tobacco Use  . Smoking status: Current Every Day Smoker  Packs/day: 1.00    Years: 25.00    Pack years: 25.00    Types: Cigarettes  . Smokeless tobacco: Never Used  . Tobacco comment: decreased over the last few weeks because he has not been breathing well  Vaping Use  . Vaping Use: Some days  Substance Use Topics  . Alcohol use: No    Alcohol/week: 0.0 standard drinks    Comment: rarely  . Drug use: Not Currently    Types: Cocaine, IV, Heroin    Comment: IVDU since Dad died in 06/04/06 with 15 month period of sobriety 2018-2019, relapse Aug 2019.    Family History  Problem Relation Age of Onset  . Diabetes Father   . Heart disease Father   . Mental illness Sister   . Cancer Neg Hx     Objective:   Vitals:   09/23/19 1608  BP: 129/84  Pulse: 80  Temp: 98 F (36.7 C)  Weight: 172 lb (78 kg)  Height: '5\' 9"'$  (1.753 m)   Body mass index is 25.4 kg/m.  Physical Exam Vitals reviewed.  Constitutional:      Appearance: Normal appearance. He is not ill-appearing.  HENT:     Head: Normocephalic.     Mouth/Throat:     Mouth: Mucous membranes are moist.     Pharynx: Oropharynx is clear.  Eyes:     General: No scleral icterus. Cardiovascular:     Rate and Rhythm: Normal rate and regular rhythm.     Heart sounds: Murmur (systolic 3/6) heard.   Pulmonary:     Effort: Pulmonary effort is normal.  Musculoskeletal:        General: Normal range of motion.     Cervical back: Normal  range of motion.     Comments: B/l arms are wrapped in clean dry dressings.   Skin:    Coloration: Skin is not jaundiced or pale.  Neurological:     Mental Status: He is alert and oriented to person, place, and time.  Psychiatric:        Mood and Affect: Mood normal.        Judgment: Judgment normal.     Lab Results: Lab Results  Component Value Date   WBC 12.9 (H) 09/15/2019   HGB 9.4 (L) 09/15/2019   HCT 28.5 (L) 09/15/2019   MCV 79.8 (L) 09/15/2019   PLT 499 (H) 09/15/2019    Lab Results  Component Value Date   CREATININE 1.43 (H) 09/15/2019   BUN 22 (H) 09/15/2019   NA 133 (L) 09/15/2019   K 3.8 09/15/2019   CL 102 09/15/2019   CO2 17 (L) 09/15/2019    Lab Results  Component Value Date   ALT 12 08/29/2019   AST 13 (L) 08/29/2019   ALKPHOS 96 08/29/2019   BILITOT 0.4 08/29/2019     Assessment & Plan:   Problem List Items Addressed This Visit      High   Acute bacterial endocarditis - Primary    Seems to be doing well nearing the end of 4 weeks of therapy for culture negative endocarditis. He will return in 3-4 weeks off antibiotics to check in again. He has a cardiology visit scheduled around that time as well. Will plan repeat echo in 2-3 months or sooner if cards desires.       Endocarditis of mitral valve    Symptomatic - referral to cardiology in process through PCP. He may require valve replacement in the future if he can continue  drug rehab.       Endocarditis of pulmonic valve     Medium   Hepatitis C (Chronic)    Reviewed natural progression and transmission of hepatitis C. I encouraged him to get his wife tested with hep c antibody at health department or personal provider.  He will need updated blood work at return visit in preparation for this. Will plan [place] with Medicaid insurance carrier.         Unprioritized   Heroin abuse ([place])    Doing better and abstaining since discharge. Grateful for his wife's support. Offered to get him to  Suboxone clinic / AD[facility] for management. He politely declined for now.       Cellulitis of forearms    Improving and clean surgery sites. Will give him information to schedule FU with Dr. Claudia Desanctis.          [name], MSN, NP-C [facility] [email or web address] Pager: [phone] Office: [phone] [place]: [phone]   09/24/19  9:37 AM

## 2019-09-23 NOTE — Assessment & Plan Note (Signed)
Doing better and abstaining since discharge. Grateful for his wife's support. Offered to get him to Suboxone clinic / AD[facility] for management. He politely declined for now.

## 2019-09-23 NOTE — Assessment & Plan Note (Signed)
Seems to be doing well nearing the end of 4 weeks of therapy for culture negative endocarditis. He will return in 3-4 weeks off antibiotics to check in again. He has a cardiology visit scheduled around that time as well. Will plan repeat echo in 2-3 months or sooner if cards desires.

## 2019-09-23 NOTE — Assessment & Plan Note (Signed)
Reviewed natural progression and transmission of hepatitis C. I encouraged him to get his wife tested with hep c antibody at health department or personal provider.  He will need updated blood work at return visit in preparation for this. Will plan [place] with Medicaid insurance carrier.

## 2019-09-24 NOTE — Assessment & Plan Note (Signed)
Symptomatic - referral to cardiology in process through PCP. He may require valve replacement in the future if he can continue drug rehab.

## 2019-09-24 NOTE — Assessment & Plan Note (Signed)
Improving and clean surgery sites. Will give him information to schedule FU with Dr. Claudia Desanctis.

## 2019-10-02 ENCOUNTER — Ambulatory Visit: Payer: Medicaid Other | Admitting: Nurse Practitioner

## 2019-10-07 ENCOUNTER — Ambulatory Visit: Payer: Medicaid Other

## 2019-10-08 ENCOUNTER — Ambulatory Visit (INDEPENDENT_AMBULATORY_CARE_PROVIDER_SITE_OTHER): Payer: Medicaid Other | Admitting: Plastic Surgery

## 2019-10-08 ENCOUNTER — Encounter: Payer: Self-pay | Admitting: Plastic Surgery

## 2019-10-08 ENCOUNTER — Other Ambulatory Visit: Payer: Self-pay

## 2019-10-08 VITALS — BP 122/78 | HR 96 | Temp 98.7°F

## 2019-10-08 DIAGNOSIS — S51809A Unspecified open wound of unspecified forearm, initial encounter: Secondary | ICD-10-CM | POA: Diagnosis not present

## 2019-10-08 NOTE — Progress Notes (Signed)
   Referring Provider Gildardo Pounds, NP [place],  [place] [address]   CC: No chief complaint on file.     Luke [name] is an 39 y.o. male.  HPI: Patient is here about a month postop from debridement of bilateral forearm wounds with placement of pry matrix mesh.  He overall feels like he is doing well.  He feels like this is the best his arms of looked in years.  He is not having any fevers or chills and is wife has been helping with the wound care.  Review of Systems General: Denies fevers or chills  Physical Exam Vitals with BMI 10/08/2019 09/23/2019 09/15/2019  Height - 5\' 9"  -  Weight - 172 lbs -  BMI - 62.44 -  Systolic 695 072 257  Diastolic 78 84 75  Pulse 96 80 110  Some encounter information is confidential and restricted. Go to Review Flowsheets activity to see all data.    General:  No acute distress,  Alert and oriented, Non-Toxic, Normal speech and affect On exam both wounds appear healthy with granulation tissue at the base.  There is a little bit of fibrinous exudate on both of them but it is minimal.  There is no purulence.  Assessment/Plan Patient is doing well from a wound healing standpoint.  We will plan to continue moist dressing changes.  I will plan to see him again in a few weeks.  All of his questions were answered.  Cindra Presume 10/08/2019, 5:42 PM

## 2019-10-09 ENCOUNTER — Other Ambulatory Visit: Payer: Self-pay | Admitting: Nurse Practitioner

## 2019-10-09 DIAGNOSIS — Z8679 Personal history of other diseases of the circulatory system: Secondary | ICD-10-CM

## 2019-10-09 MED FILL — traZODone HCL 50 MG TABS: 50 | 90 days supply | Qty: 90 | Fill #0

## 2019-10-09 NOTE — Telephone Encounter (Signed)
Requested medication (s) are on the active medication list: yes  Last refill:  09/18/2019  Future visit scheduled: yes  Notes to clinic:  looks like this was suppose to be a short supply  Review for refill   Requested Prescriptions  Pending Prescriptions Disp Refills   furosemide (LASIX) 20 MG tablet [Pharmacy Med Name: FUROSEMIDE 20 MG TABS 20 Tablet] 14 tablet 0    Sig: Take 1 tablet (20 mg total) by mouth daily for 14 days.      Cardiovascular:  Diuretics - Loop Failed - 10/09/2019  9:54 AM      Failed - Na in normal range and within 360 days    Sodium  Date Value Ref Range Status  09/15/2019 133 (L) 135 - 145 mmol/L Final  06/23/2019 136 134 - 144 mmol/L Final  04/21/2014 138 136 - 145 mmol/L Final          Failed - Cr in normal range and within 360 days    Creat  Date Value Ref Range Status  07/23/2014 1.31 0.50 - 1.35 mg/dL Final   Creatinine, Ser  Date Value Ref Range Status  09/15/2019 1.43 (H) 0.61 - 1.24 mg/dL Final   Creatinine, Urine  Date Value Ref Range Status  03/19/2019 92.31 mg/dL Final    Comment:    Performed at [facility], [place] [address], [place], [place] [address]          Passed - K in normal range and within 360 days    Potassium  Date Value Ref Range Status  09/15/2019 3.8 3.5 - 5.1 mmol/L Final  04/21/2014 4.7 3.5 - 5.1 mmol/L Final          Passed - Ca in normal range and within 360 days    Calcium  Date Value Ref Range Status  09/15/2019 8.9 8.9 - 10.3 mg/dL Final   Calcium, Total  Date Value Ref Range Status  04/21/2014 9.2 8.5 - 10.1 mg/dL Final   Calcium, Ion  Date Value Ref Range Status  03/19/2019 1.10 (L) 1.15 - 1.40 mmol/L Final          Passed - Last BP in normal range    BP Readings from Last 1 Encounters:  10/08/19 122/78          Passed - Valid encounter within last 6 months    Recent Outpatient Visits           3 weeks ago Hospital discharge follow-up   [place] Gildardo Pounds, NP   3 months ago Hypokalemia   [place], Dionne Bucy, [place]   4 months ago Hospital discharge follow-up   [place], Zelda W, NP   5 months ago Bacterial skin infection   [place], [place] W, NP   1 year ago Uncontrolled insulin dependent type 1 diabetes mellitus [facility])   [place], [name], NP       Future Appointments             In 1 week Doren Custard, Melton Krebs, NP [facility]   In 3 weeks O'Neal, Cassie Freer, MD [facility] [place]   In 1 month Gildardo Pounds, NP [place]   In 1 month Gildardo Pounds, NP [facility]

## 2019-10-21 ENCOUNTER — Other Ambulatory Visit: Payer: Self-pay

## 2019-10-21 ENCOUNTER — Ambulatory Visit (INDEPENDENT_AMBULATORY_CARE_PROVIDER_SITE_OTHER): Payer: Medicaid Other | Admitting: Surgical

## 2019-10-21 ENCOUNTER — Ambulatory Visit (INDEPENDENT_AMBULATORY_CARE_PROVIDER_SITE_OTHER): Payer: Medicaid Other | Admitting: Infectious Diseases

## 2019-10-21 DIAGNOSIS — B182 Chronic viral hepatitis C: Secondary | ICD-10-CM

## 2019-10-21 DIAGNOSIS — S51809A Unspecified open wound of unspecified forearm, initial encounter: Secondary | ICD-10-CM

## 2019-10-21 NOTE — Progress Notes (Signed)
No show

## 2019-10-28 NOTE — Progress Notes (Signed)
Cardiology Office Note:   Date:  10/30/2019  NAME:  Luke [name]    MRN: [id number] DOB:  1980-06-02   PCP:  Gildardo Pounds, NP  Cardiologist:  No primary care provider on file.   Referring MD: Gildardo Pounds, NP   Chief Complaint  Patient presents with  . Edema    left foot   History of Present Illness:   Luke [name] is a 39 y.o. male with a hx of IVDA, MV/PV endocarditis, severe MR, DM who is being seen today for the evaluation of mitral regurgitation at the request of Gildardo Pounds, NP. 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 Not candidate for surgery due to IVDA. Re-admitted 08/2019 and MR is now severe.   He reports has been doing well since leaving the hospital.  I did review his echocardiograms and he has severe mitral regurgitation.  He has a loud systolic murmur that radiates into the axilla consistent with anterior mitral valve leaflet pathology and a posteriorly directed jet.  He is tachycardic on examination today with heart rate of 109.  He reports no further fevers or chills.  He reports he has been off heroin and cocaine.  He has not used any drugs seen in the hospital per his report.  I absolutely hope this is the case.  He has severe uncontrolled diabetes with an A1c of 10.8.  Most recent lipid profile shows an LDL cholesterol of 156.  He does have some anemia with a hemoglobin of 9.4 and some CKD with a creatinine 1.43.  He reports some swelling in his lower extremities.  He reports he was taking Lasix but taken off this.  He reports he is able to walk as far as he likes but does get short of breath with some activity.  His lower extremity edema is most concerning.  His loud murmur is also very concerning.  Problem List 1. IVDA 2. MV/PV endocarditis  3. 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 DM -A1c 10.8 -Total cholesterol 237, HDL 36, LDL 156, triglycerides 224 5.  Anemia -Hemoglobin  9.4 Past Medical History: Past Medical History:  Diagnosis Date  . Anemia   . Anxiety  Dx 2008  . Cellulitis   . Chronic kidney disease   . Diabetes mellitus without complication ([place]) Dx 2248  . DKA (diabetic ketoacidoses) ([place]) 07/21/2017  . Endocarditis 04/2019  . Endocarditis   . GERD (gastroesophageal reflux disease) Dx 2008  . Headache(784.0)   . Heart murmur   . Hepatitis C   . Hypertension   . Hypothyroidism   . MRSA infection   . Pneumonia   . Seizures ([place]) 2011    x 2 in lifetime. on Dilantin for a while.   . Sepsis ([place])   . 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 Type 1 diabetes ([place])     Past Surgical History: Past Surgical History:  Procedure Laterality Date  . AMPUTATION Left 09/05/2018   Procedure: LEFT GREAT TOE AMPUTATION;  Surgeon: [name], MD;  Location: [place];  Service: Orthopedics;  Laterality: Left;  . APPLICATION OF A-CELL OF EXTREMITY Bilateral 09/01/2019   Procedure: APPLICATION OF MESHED PRIMATRIX AG OF EXTREMITY;  Surgeon: Cindra Presume, MD;  Location: [place];  Service: Plastics;  Laterality: Bilateral;  . I & D EXTREMITY Left 10/11/2012   Procedure: IRRIGATION AND DEBRIDEMENT ABSCESS FOREARM;  Surgeon: Linna Hoff, MD;  Location: [place];  Service: Orthopedics;  Laterality: Left;  . I & D EXTREMITY Left 10/12/2012   Procedure: IRRIGATION AND DEBRIDEMENT FOREARM;  Surgeon: Linna Hoff, MD;  Location: [facility] [place];  Service: Orthopedics;  Laterality: Left;  . I & D EXTREMITY Left 10/14/2012   Procedure: incision and drainage left forearm;  Surgeon: Linna Hoff, MD;  Location: [place];  Service: Orthopedics;  Laterality: Left;  . I & D EXTREMITY Left 10/16/2012   Procedure: IRRIGATION AND DEBRIDEMENT LEFT FOREARM;  Surgeon: Linna Hoff, MD;  Location: [facility];  Service: Orthopedics;  Laterality: Left;  . I & D EXTREMITY Left 10/20/2012   Procedure: INCISION AND DRAINAGE AND DEBRIDEMENT LEFT  FOREARM;  Surgeon: Linna Hoff, MD;  Location: [facility];  Service: Orthopedics;  Laterality: Left;  . I & D EXTREMITY Right 05/22/2019   Procedure: IRRIGATION AND DEBRIDEMENT RIGHT ARM;  Surgeon: [name], MD;  Location: [name];  Service: Orthopedics;  Laterality: Right;  . INCISION AND DRAINAGE OF WOUND Bilateral 11/29/2017   Procedure: DEBRIDEMENT BILATERAL FEET, DEBRIDEMENT LEFT ANKLE, AND APPLY WOUND VAC;  Surgeon: [name], MD;  Location: [place];  Service: Orthopedics;  Laterality: Bilateral;  . INCISION AND DRAINAGE OF WOUND Bilateral 09/01/2019   Procedure: IRRIGATION AND DEBRIDEMENT FOREARM WOUNDS;  Surgeon: Cindra Presume, MD;  Location: [place];  Service: Plastics;  Laterality: Bilateral;  . IRRIGATION AND DEBRIDEMENT ABSCESS     Hx: of left arm abscess related to drug use   . TEE WITHOUT CARDIOVERSION N/A 11/28/2017   Procedure: TRANSESOPHAGEAL ECHOCARDIOGRAM (TEE);  Surgeon: Sanda Klein, MD;  Location: [facility] [place];  Service: Cardiovascular;  Laterality: N/A;  . TEE WITHOUT CARDIOVERSION N/A 05/12/2019   Procedure: TRANSESOPHAGEAL ECHOCARDIOGRAM (TEE);  Surgeon: Skeet Latch, MD;  Location: [facility] ENDOSCOPY;  Service: Cardiovascular;  Laterality: N/A;    Current Medications: Current Meds  Medication Sig  . albuterol (VENTOLIN HFA) 108 (90 Base) MCG/ACT inhaler Inhale 2 puffs into the lungs every 6 (six) hours as needed for wheezing or shortness of breath.  Marland Kitchen atorvastatin (LIPITOR) 20 MG tablet Take 1 tablet (20 mg total) by mouth daily.  . Bismuth Tribromoph-Petrolatum (XEROFORM PETROLATUM ROLL 4"X9') MISC Apply topically to affected area as prescribed  . Blood Glucose Monitoring Suppl (TRUE METRIX METER) DEVI 1 kit by Does not apply route as directed. Use as directed  . carvedilol (COREG) 6.25 MG tablet Take 1 tablet (6.25 mg total) by mouth 2 (two) times daily with a meal.  . DULoxetine (CYMBALTA) 30 MG capsule Take 1 capsule (30 mg total) by mouth daily.  . ferrous sulfate 325 (65 FE) MG tablet  Take 1 tablet (325 mg total) by mouth daily with breakfast.  . Gauze Pads & Dressings (KERLIX BANDAGE ROLL) MISC Apply to affected area as prescribed  . glucose blood test strip Use as instructed. Check blood glucose level by fingerstick twice per day. E11.65  . Insulin Glargine (BASAGLAR KWIKPEN) 100 UNIT/ML Inject 40 Units into the skin 2 (two) times daily.  . insulin lispro (HUMALOG KWIKPEN) 100 UNIT/ML KwikPen Inject 0.04-0.2 mLs (4-20 Units total) into the skin 3 (three) times daily. Per sliding scale  . levothyroxine (SYNTHROID) 50 MCG tablet Take 1 tablet (50 mcg total) by mouth daily.  . Melatonin 5 MG CHEW Chew 5 mg by mouth daily as needed (sleep).   Marland Kitchen omeprazole (PRILOSEC) 20 MG capsule Take 1 capsule (20 mg total) by  mouth daily.  . sertraline (ZOLOFT) 50 MG tablet Take 1 tablet (50 mg total) by mouth daily.  . traZODone (DESYREL) 50 MG tablet Take 1 tablet (50 mg total) by mouth at bedtime.  . TRUEplus Lancets 28G MISC Use as instructed. Check blood glucose level by fingerstick twice per day. E11.65  . [DISCONTINUED] Adhesive Tape (PAPER TAPE 1"X10YD) TAPE Use as instructed  . [DISCONTINUED] levofloxacin (LEVAQUIN) 750 MG tablet Take 1 tablet (750 mg total) by mouth daily.  . [DISCONTINUED] nicotine (NICODERM CQ - DOSED IN MG/24 HOURS) 14 mg/24hr patch Place 1 patch (14 mg total) onto the skin daily.     Allergies:    Patient has no known allergies.   Social History: Social History   Socioeconomic History  . Marital status: Married    Spouse name: Not on file  . Number of children: 6  . Years of education: some colle  . Highest education level: Not on file  Occupational History  . Occupation: Event set up    Employer: [employer]   . Occupation: Stencil for cars   Tobacco Use  . Smoking status: Current Every Day Smoker    Packs/day: 1.00    Years: 25.00    Pack years: 25.00    Types: Cigarettes  . Smokeless tobacco: Never Used  . Tobacco  comment: decreased over the last few weeks because he has not been breathing well  Vaping Use  . Vaping Use: Some days  Substance and Sexual Activity  . Alcohol use: No    Alcohol/week: 0.0 standard drinks    Comment: rarely  . Drug use: Not Currently    Types: Cocaine, IV, Heroin    Comment: IVDU since Dad died in 08-May-2006 with 15 month period of sobriety 2018-2019, relapse Aug 2019.  Marland Kitchen Sexual activity: Yes    Partners: Female    Birth control/protection: None  Other Topics Concern  . Not on file  Social History Narrative   Lives with wife and two children age 23 and 58.          Social Determinants of Health   Financial Resource Strain:   . Difficulty of Paying Living Expenses:   Food Insecurity:   . Worried About Charity fundraiser in the Last Year:   . Arboriculturist in the Last Year:   Transportation Needs: No Transportation Needs  . Lack of Transportation (Medical): No  . Lack of Transportation (Non-Medical): No  Physical Activity:   . Days of Exercise per Week:   . Minutes of Exercise per Session:   Stress:   . Feeling of Stress :   Social Connections: Moderately Isolated  . Frequency of Communication with Friends and Family: More than three times a week  . Frequency of Social Gatherings with Friends and Family: More than three times a week  . Attends Religious Services: Never  . Active Member of Clubs or Organizations: No  . Attends Archivist Meetings: Not on file  . Marital Status: Married     Family History: The patient's family history includes Diabetes in his father; Heart disease in his father; Mental illness in his sister. There is no history of Cancer.  ROS:   All other ROS reviewed and negative. Pertinent positives noted in the HPI.     EKGs/Labs/Other Studies Reviewed:   The following studies were personally reviewed by me today:  EKG:  EKG is ordered today.  The ekg ordered  today demonstrates sinus tachycardia, heart rate 109, LVH noted,  secondary repolarization abnormalities noted, and was personally reviewed by me.   TTE 08/28/2019  1. Mitral and pulmonic valve vegetations. Endocarditis.  2. Left ventricular ejection fraction, by estimation, is 70 to 75%. The  left ventricle has hyperdynamic function. The left ventricle has no  regional wall motion abnormalities. Left ventricular diastolic parameters  were normal.  3. Right ventricular systolic function is normal. The right ventricular  size is normal.  4. There is a anterior mitral leaflet valvular vegetation (1.1 x 0.6 cm).  The mitral valve is abnormal. Moderate mitral valve regurgitation. No  evidence of mitral stenosis.  5. The aortic valve is normal in structure. Aortic valve regurgitation is  mild. No aortic stenosis is present.  6. Possible vegetation on pulmonic valve. The pulmonic valve was  abnormal.  7. The inferior vena cava is normal in size with greater than 50%  respiratory variability, suggesting right atrial pressure of 3 mmHg.   Recent Labs: 06/23/2019: TSH 1.190 08/21/2019: Magnesium 1.7 08/27/2019: B Natriuretic Peptide 257.6 08/29/2019: ALT 12 09/15/2019: BUN 22; Creatinine, Ser 1.43; Hemoglobin 9.4; Platelets 499; Potassium 3.8; Sodium 133   Recent Lipid Panel    Component Value Date/Time   CHOL 237 (H) 10/21/2017 1508   CHOL 150 04/11/2014 0054   TRIG 224 (H) 10/21/2017 1508   TRIG 121 04/11/2014 0054   HDL 36 (L) 10/21/2017 1508   HDL 46 04/11/2014 0054   CHOLHDL 6.6 10/21/2017 1508   VLDL 45 (H) 10/21/2017 1508   VLDL 24 04/11/2014 0054   LDLCALC 156 (H) 10/21/2017 1508   LDLCALC 80 04/11/2014 0054    Physical Exam:   VS:  BP 104/64 (BP Location: Left Arm, Patient Position: Sitting, Cuff Size: Normal)   Pulse (!) 109   Ht _0  (1.753 m)   Wt 159 lb 3.2 oz (72.2 kg)   BMI 23.51 kg/m    Wt Readings from Last 3 Encounters:  10/30/19 159 lb 3.2 oz (72.2 kg)  09/23/19 172 lb (78 kg)  09/10/19 168 lb 10.4 oz (76.5 kg)      General: Well nourished, well developed, in no acute distress Heart: Atraumatic, normal size  Eyes: PEERLA, EOMI  Neck: Supple, no JVD Endocrine: No thryomegaly Cardiac: Normal S1, S2; 3 of 6 loud high-pitched systolic murmur that is holosystolic, radiates into the axilla consistent with anterior mitral valve leaflet pathology Lungs: Clear to auscultation bilaterally, no wheezing, rhonchi or rales  Abd: Soft, nontender, no hepatomegaly  Ext: Trace edema Musculoskeletal: No deformities, BUE and BLE strength normal and equal Skin: Warm and dry, no rashes   Neuro: Alert and oriented to person, place, time, and situation, CNII-XII grossly intact, no focal deficits  Psych: Normal mood and affect   ASSESSMENT:   CHACE KLIPPEL is a 39 y.o. male who presents for the following: 1. Endocarditis of mitral valve   2. Severe mitral regurgitation   3. Pure hypercholesterolemia   4. History of CHF (congestive heart failure)    PLAN:   1. Endocarditis of mitral valve 2. Severe mitral regurgitation -I have reviewed his echocardiograms.  He has severe eccentric mitral regurgitation.  I suspect disruption of the anterior mitral valve leaflet resulting in eccentric jet that is posteriorly directed.  Loud murmur radiates into the axilla.  Classic.  The LV has dilated between initial echocardiogram at the time of his diagnosis of endocarditis and his most recent one.  He has a loud  high-pitched holosystolic murmur consistent with severe mitral vegetation.  He was tachycardic on exam with trace lower extremity edema. -He does report some symptoms of shortness of breath and lower extremity edema.  We need to check a BNP.  Basically he needs his mitral valve fixed.  I will go ahead and start him back on Lasix 20 mg daily.  He will continue Coreg 6.25 mg twice daily.  We will also start him on lisinopril 2.5 mg daily.  Blood pressure 104/64. -He needs a transesophageal echocardiogram.  We need to also look at the  pulmonary valve.  I suspect the mitral valve will have to be replaced.  We will need to get a good look at what is going with the pulmonary valve.  As this may need to be addressed as well at the time of surgery. -He will also need a left and right heart catheterization.  We will set him up for heart cath on the day of his transesophageal echocardiogram on September 7 with me.  3. Pure hypercholesterolemia -Continue aspirin and statin.  Disposition: Return in about 6 weeks (around 12/11/2019).  Medication Adjustments/Labs and Tests Ordered: Current medicines are reviewed at length with the patient today.  Concerns regarding medicines are outlined above.  Orders Placed This Encounter  Procedures  . CBC  . Basic metabolic panel  . Brain natriuretic peptide  . EKG 12-Lead   Meds ordered this encounter  Medications  . lisinopril (ZESTRIL) 2.5 MG tablet    Sig: Take 1 tablet (2.5 mg total) by mouth daily.    Dispense:  90 tablet    Refill:  3  . aspirin EC 81 MG tablet    Sig: Take 1 tablet (81 mg total) by mouth daily. Swallow whole.    Dispense:  90 tablet    Refill:  3  . furosemide (LASIX) 20 MG tablet    Sig: Take 1 tablet (20 mg total) by mouth daily for 14 days.    Dispense:  90 tablet    Refill:  1    Patient Instructions  Medication Instructions:  Start Lasix 20 mg daily  Start Lisinopril 2.5 mg daily  Start Aspirin 81 mg daily   *If you need a refill on your cardiac medications before your next appointment, please call your pharmacy*   Lab Work: BMET, CBC, BNP (one week before your TEE and CATH, after August 31st) COVID TESTING: September 4th @ 11:55 [place], [place] [address]   If you have labs (blood work) drawn today and your tests are completely normal, you will receive your results only by: Marland Kitchen MyChart Message (if you have MyChart) OR . A paper copy in the mail If you have any lab test that is abnormal or we need to change your treatment, we  will call you to review the results.   Testing/Procedures: Your physician has requested that you have a TEE. During a TEE, sound waves are used to create images of your heart. It provides your doctor with information about the size and shape of your heart and how well your heart's chambers and valves are working. In this test, a transducer is attached to the end of a flexible tube that's guided down your throat and into your esophagus (the tube leading from you mouth to your stomach) to get a more detailed image of your heart. You are not awake for the procedure. Please see the instruction sheet given to you today. For further information please visit  [email or web address].   Your physician has requested that you have a cardiac catheterization. Cardiac catheterization is used to diagnose and/or treat various heart conditions. Doctors may recommend this procedure for a number of different reasons. The most common reason is to evaluate chest pain. Chest pain can be a symptom of coronary artery disease (CAD), and cardiac catheterization can show whether plaque is narrowing or blocking your heart's arteries. This procedure is also used to evaluate the valves, as well as measure the blood flow and oxygen levels in different parts of your heart. For further information please visit [email or web address]. Please follow instruction sheet, as given.     Follow-Up: At [facility], you and your health needs are our priority.  As part of our continuing mission to provide you with exceptional heart care, we have created designated Provider Care Teams.  These Care Teams include your primary Cardiologist (physician) and Advanced Practice Providers (APPs -  Physician Assistants and Nurse Practitioners) who all work together to provide you with the care you need, when you need it.  We recommend signing up for the patient portal called "MyChart".  Sign up information is provided on this After Visit Summary.  MyChart is  used to connect with patients for Virtual Visits (Telemedicine).  Patients are able to view lab/test results, encounter notes, upcoming appointments, etc.  Non-urgent messages can be sent to your provider as well.   To learn more about what you can do with MyChart, go to [email or web address].    Your next appointment:   2 week(s) post tee/cath  The format for your next appointment:   In Person  Provider:   Eleonore Chiquito, MD   Other Instructions  You are scheduled for a TEE on September 7th  with Dr. Audie Box.  Please arrive at the [facility] (Main Entrance A) at [facility]: [place], [place] [address] at 9:30 am. (1 hour prior to procedure unless lab work is needed; if lab work is needed arrive 1.5 hours ahead)  DIET: Nothing to eat or drink after midnight except a sip of water with medications (see medication instructions below)  Medication Instructions:    You will need to continue your anticoagulant after your procedure until you  are told by your  Provider that it is safe to stop   Labs: BMET, CBC (one week before your TEE and CATH, after August 31st) COVID TESTING: September 4th @ 11:55 [place], [place] [address]  You must have a responsible person to drive you home and stay in the waiting area during your procedure. Failure to do so could result in cancellation.  Bring your insurance cards.  *Special Note: Every effort is made to have your procedure done on time. Occasionally there are emergencies that occur at the hospital that may cause delays. Please be patient if a delay does occur.          [place] [address] Dept: [phone] Loc: [phone]  KRISTOF NADEEM  10/30/2019  You are scheduled for a Cardiac Catheterization on Tuesday, September 7 with Dr. Peter Martinique.  1. Please arrive at the [facility] (Main Entrance A) at [facility]: [address] [place], [place] [address] at 12:00 PM (This time is two hours before your procedure to ensure your preparation). Free valet parking service is available.   Special note: Every effort is made to have your  procedure done on time. Please understand that emergencies sometimes delay scheduled procedures.  2. Diet: Do not eat solid foods after midnight.  The patient may have clear liquids until 5am upon the day of the procedure.  3. Labs: BMET, CBC, BNP (one week before your TEE and CATH, after August 31st) COVID TESTING: September 4th @ 11:55 [place], [place] [address]  4. Medication instructions in preparation for your procedure:   Contrast Allergy: No   HOLD LISINOPRIL the day of your CATH.  On the morning of your procedure, take your Aspirin and any morning medicines NOT listed above.  You may use sips of water.  5. Plan for one night stay--bring personal belongings. 6. Bring a current list of your medications and current insurance cards. 7. You MUST have a responsible person to drive you home. 8. Someone MUST be with you the first 24 hours after you arrive home or your discharge will be delayed. 9. Please wear clothes that are easy to get on and off and wear slip-on shoes.  Thank you for allowing [place] to care for you!   -- [facility]        Signed, Addison Naegeli. Audie Box, [place]  [address], [place], [place] [address] [phone]  10/30/2019 3:54 PM

## 2019-10-29 MED FILL — ?BASAGLAR 100 UNITS/ML KWPE: 100 | 25 days supply | Qty: 15 | Fill #3

## 2019-10-29 MED FILL — HUMALOG 100 UNITS/ML KWIKPE: 100 | 25 days supply | Qty: 15 | Fill #1

## 2019-10-30 ENCOUNTER — Ambulatory Visit (INDEPENDENT_AMBULATORY_CARE_PROVIDER_SITE_OTHER): Payer: Medicaid Other | Admitting: Cardiovascular Disease

## 2019-10-30 ENCOUNTER — Other Ambulatory Visit: Payer: Self-pay

## 2019-10-30 ENCOUNTER — Encounter: Payer: Self-pay | Admitting: Cardiovascular Disease

## 2019-10-30 VITALS — BP 104/64 | HR 109 | Ht 69.0 in | Wt 159.2 lb

## 2019-10-30 DIAGNOSIS — Z8679 Personal history of other diseases of the circulatory system: Secondary | ICD-10-CM | POA: Diagnosis not present

## 2019-10-30 DIAGNOSIS — I059 Rheumatic mitral valve disease, unspecified: Secondary | ICD-10-CM

## 2019-10-30 DIAGNOSIS — E7800 Pure hypercholesterolemia, unspecified: Secondary | ICD-10-CM

## 2019-10-30 DIAGNOSIS — I340 Nonrheumatic mitral (valve) insufficiency: Secondary | ICD-10-CM

## 2019-10-30 DIAGNOSIS — I058 Other rheumatic mitral valve diseases: Secondary | ICD-10-CM

## 2019-10-30 MED ORDER — FUROSEMIDE 20 MG PO TABS: 20.0000 mg | ORAL_TABLET | Freq: Every day | ORAL | 1 refills | Status: DC

## 2019-10-30 MED ORDER — ASPIRIN EC 81 MG PO TBEC
81.0000 mg | DELAYED_RELEASE_TABLET | Freq: Every day | ORAL | 3 refills | Status: DC
Start: 2019-10-30 — End: 2020-10-02

## 2019-10-30 MED ORDER — LISINOPRIL 2.5 MG PO TABS
2.5000 mg | ORAL_TABLET | Freq: Every day | ORAL | 3 refills | Status: DC
Start: 2019-10-30 — End: 2020-10-02

## 2019-10-30 NOTE — Patient Instructions (Signed)
Medication Instructions:  Start Lasix 20 mg daily  Start Lisinopril 2.5 mg daily  Start Aspirin 81 mg daily   *If you need a refill on your cardiac medications before your next appointment, please call your pharmacy*   Lab Work: BMET, CBC, BNP (one week before your TEE and CATH, after August 31st) COVID TESTING: September 4th @ 11:55 [place], [place] [address]   If you have labs (blood work) drawn today and your tests are completely normal, you will receive your results only by: Marland Kitchen MyChart Message (if you have MyChart) OR . A paper copy in the mail If you have any lab test that is abnormal or we need to change your treatment, we will call you to review the results.   Testing/Procedures: Your physician has requested that you have a TEE. During a TEE, sound waves are used to create images of your heart. It provides your doctor with information about the size and shape of your heart and how well your heart's chambers and valves are working. In this test, a transducer is attached to the end of a flexible tube that's guided down your throat and into your esophagus (the tube leading from you mouth to your stomach) to get a more detailed image of your heart. You are not awake for the procedure. Please see the instruction sheet given to you today. For further information please visit [email or web address].   Your physician has requested that you have a cardiac catheterization. Cardiac catheterization is used to diagnose and/or treat various heart conditions. Doctors may recommend this procedure for a number of different reasons. The most common reason is to evaluate chest pain. Chest pain can be a symptom of coronary artery disease (CAD), and cardiac catheterization can show whether plaque is narrowing or blocking your heart's arteries. This procedure is also used to evaluate the valves, as well as measure the blood flow and oxygen levels in different parts of your heart. For further  information please visit [email or web address]. Please follow instruction sheet, as given.     Follow-Up: At [facility], you and your health needs are our priority.  As part of our continuing mission to provide you with exceptional heart care, we have created designated Provider Care Teams.  These Care Teams include your primary Cardiologist (physician) and Advanced Practice Providers (APPs -  Physician Assistants and Nurse Practitioners) who all work together to provide you with the care you need, when you need it.  We recommend signing up for the patient portal called "MyChart".  Sign up information is provided on this After Visit Summary.  MyChart is used to connect with patients for Virtual Visits (Telemedicine).  Patients are able to view lab/test results, encounter notes, upcoming appointments, etc.  Non-urgent messages can be sent to your provider as well.   To learn more about what you can do with MyChart, go to [email or web address].    Your next appointment:   2 week(s) post tee/cath  The format for your next appointment:   In Person  Provider:   Eleonore Chiquito, MD   Other Instructions  You are scheduled for a TEE on September 7th  with Dr. Audie [name].  Please arrive at the [facility] (Main Entrance A) at [facility]: [place], [place] [address] at 9:30 am. (1 hour prior to procedure unless lab work is needed; if lab work is needed arrive 1.5 hours ahead)  DIET: Nothing to eat or drink after midnight except a sip of  water with medications (see medication instructions below)  Medication Instructions:    You will need to continue your anticoagulant after your procedure until you  are told by your  Provider that it is safe to stop   Labs: BMET, CBC (one week before your TEE and CATH, after August 31st) COVID TESTING: September 4th @ 11:55 [place], [place] [address]  You must have a responsible person to drive you home and stay in the  waiting area during your procedure. Failure to do so could result in cancellation.  Bring your insurance cards.  *Special Note: Every effort is made to have your procedure done on time. Occasionally there are emergencies that occur at the hospital that may cause delays. Please be patient if a delay does occur.          [place] [address] Dept: [phone] Loc: [phone]  Luke [name]  10/30/2019  You are scheduled for a Cardiac Catheterization on Tuesday, September 7 with Luke [name].  1. Please arrive at the [facility] (Main Entrance A) at [facility]: [address] [place], [place] [address] at 12:00 PM (This time is two hours before your procedure to ensure your preparation). Free valet parking service is available.   Special note: Every effort is made to have your procedure done on time. Please understand that emergencies sometimes delay scheduled procedures.  2. Diet: Do not eat solid foods after midnight.  The patient may have clear liquids until 5am upon the day of the procedure.  3. Labs: BMET, CBC, BNP (one week before your TEE and CATH, after August 31st) COVID TESTING: September 4th @ 11:55 [place], [place] [address]  4. Medication instructions in preparation for your procedure:   Contrast Allergy: No   HOLD LISINOPRIL the day of your CATH.  On the morning of your procedure, take your Aspirin and any morning medicines NOT listed above.  You may use sips of water.  5. Plan for one night stay--bring personal belongings. 6. Bring a current list of your medications and current insurance cards. 7. You MUST have a responsible person to drive you home. 8. Someone MUST be with you the first 24 hours after you arrive home or your discharge will be delayed. 9. Please wear clothes that are easy to get on and off and wear  slip-on shoes.  Thank you for allowing [place] to care for you!   -- [facility]

## 2019-10-31 NOTE — Progress Notes (Signed)
No charge for no show

## 2019-11-16 ENCOUNTER — Ambulatory Visit: Payer: Medicaid Other | Admitting: Infectious Diseases

## 2019-11-17 ENCOUNTER — Ambulatory Visit: Payer: Medicaid Other | Attending: Nurse Practitioner | Admitting: Nurse Practitioner

## 2019-11-19 ENCOUNTER — Telehealth: Payer: Self-pay | Admitting: *Deleted

## 2019-11-19 NOTE — Telephone Encounter (Addendum)
Pt contacted pre-catheterization scheduled at [facility] for: Tuesday November 24, 2019 12 Noon Verified arrival time and place: [place] [facility]) at: 9:30 AM/TEE 10:30 AM   Nothing to eat or drink after midnight prior to procedures, may have sips of water to take medications  Hold: Insulin-AM of procedure Insulin-will take 1/2 usual HS dose if needed Lasix-AM of procedure Lisinopril-AM of procedure  Except hold medications AM meds can be  taken pre-cath with sips of water including: ASA 81 mg   Confirmed patient has responsible adult to drive home post procedure and be with patient first 24 hours after arriving home: yes  You are allowed ONE visitor in the waiting room during the time you are at the hospital for your procedure. Both you and your visitor must wear a mask once you enter the hospital.       COVID-19 Pre-Screening Questions:  . In the past 10 days have you had a new cough, shortness of breath, headache, congestion, fever (100 or greater) unexplained body aches, new sore throat, or sudden loss of taste or sense of smell? no . In the past 10 days have you been around anyone with known Covid 19? no . Have you been vaccinated for COVID-19? Yes, see immunization history   Reviewed procedure/mask/visitor instructions, COVID-19 screening questions with patient.

## 2019-11-19 NOTE — Telephone Encounter (Signed)
Reminded patient to get pre-procedure BMP/CBC fasting  as early as possible after 8 AM in the morning (11/20/19) at the lab at Dr O'Neal's office: Luke [name] [address] At  front door is a lab corp stand- sign in on clip board and ring the door bell and wait in hall way.  Lab tech will come out to get you.  Reminded patient to get pre-procedure COVID-19 test Saturday November 21, 2019 11:55 AM Pt knows test site closes at 22 Noon on Saturdays and test must be done before procedures on Tuesday.  Pt knows that he needs to have testing done prior to  procedures done on Tuesday and that office/test site closed on Monday November 23, 2019 for Labor Day.  Pt aware I will also send this information to him through [place].

## 2019-11-21 ENCOUNTER — Inpatient Hospital Stay (HOSPITAL_COMMUNITY): Admission: RE | Admit: 2019-11-21 | Payer: Medicaid Other | Source: Ambulatory Visit

## 2019-11-23 ENCOUNTER — Telehealth: Payer: Self-pay

## 2019-11-23 NOTE — Telephone Encounter (Signed)
Checked on the pts chart for his pre- procedure scheduled 11/24/19 and it did not appear that he had his labs and COVID testing done as planned for 9/3 and 11/21/19.   I called the pt and he reported that he woke up Thursday morning and he was throwing up and he did not feel like going. He reports that he is feeling better now but I advised him that I will have to inform his provider and we may have to reschedule his planned procedures. Advised him someone or myself will be getting back in touch with him but the office is closed today.   Pt agreed and verbalized understanding.

## 2019-11-23 NOTE — Telephone Encounter (Signed)
Almyra Free:  We will discuss him when I see you in clinic. He is going to have to have 3 negative random drug screens before we reschedule him.   [place] T. Audie Box, [place]  [address] [place], [place] [address] [phone]  5:05 PM

## 2019-11-24 ENCOUNTER — Ambulatory Visit (HOSPITAL_COMMUNITY): Admission: RE | Admit: 2019-11-24 | Payer: Medicaid Other | Source: Home / Self Care | Admitting: Cardiology

## 2019-11-24 ENCOUNTER — Encounter (HOSPITAL_COMMUNITY): Admission: RE | Payer: Medicaid Other | Source: Home / Self Care

## 2019-11-24 ENCOUNTER — Other Ambulatory Visit (HOSPITAL_COMMUNITY): Payer: Medicaid Other

## 2019-11-24 ENCOUNTER — Encounter (HOSPITAL_COMMUNITY): Admission: RE | Payer: Self-pay | Source: Home / Self Care

## 2019-11-24 SURGERY — RIGHT/LEFT HEART CATH AND CORONARY ANGIOGRAPHY
Anesthesia: LOCAL

## 2019-11-24 SURGERY — ECHOCARDIOGRAM, TRANSESOPHAGEAL
Anesthesia: Monitor Anesthesia Care

## 2019-11-24 NOTE — Telephone Encounter (Signed)
Several messages have been left for the pt to please call our office back... his TEE and Cath has been cancelled for today due to no preprocedure appts kept by the pt.

## 2019-11-24 NOTE — Telephone Encounter (Signed)
Patient to be seen in office by Dr.O'Neal on 09/23 to discuss.

## 2019-11-24 NOTE — Progress Notes (Signed)
Called patient to confirm we are canceling procedure due to not having a Covid test.  Left message for patient explaining and asking him to call Dr O'Neal's office to reschedule.

## 2019-11-26 MED FILL — DULoxetine HCL 30 MG CPEP: 30 | 90 days supply | Qty: 90 | Fill #0

## 2019-11-26 MED FILL — LANTUS SOLOSTAR 100 UNITS/M: 100 | 25 days supply | Qty: 15 | Fill #4

## 2019-11-27 ENCOUNTER — Other Ambulatory Visit: Payer: Self-pay | Admitting: Pharmacist

## 2019-11-27 DIAGNOSIS — IMO0002 Reserved for concepts with insufficient information to code with codable children: Secondary | ICD-10-CM

## 2019-11-27 MED ORDER — TRUEPLUS 5-BEVEL PEN NEEDLES 32G X 4 MM MISC: 2 refills | Status: AC

## 2019-11-27 MED FILL — TRUEPLUS 5-BEVEL PEN NEEDLE: 31G X 5 MM | 50 days supply | Qty: 100 | Fill #0

## 2019-11-30 ENCOUNTER — Ambulatory Visit: Payer: Medicaid Other | Admitting: Nurse Practitioner

## 2019-12-09 NOTE — Progress Notes (Deleted)
Cardiology Office Note:   Date:  12/09/2019  NAME:  Luke [name]    MRN: [id number] DOB:  December 12, 1980   PCP:  Gildardo Pounds, NP  Cardiologist:  No primary care provider on file.  Electrophysiologist:  None   Referring MD: Gildardo Pounds, NP   No chief complaint on file. ***  History of Present Illness:   Luke [name] is a 39 y.o. male with a hx of IVDA, MV/PV endocarditis, severe MR, DM anemia who presents for follow-up. He has severe MV regurgitation. Missed appointment for TEE/LHC. Needs to drug screen.   Problem List 1. IVDA 2. MV/PV endocarditis  3. 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 DM -A1c 10.8 -Total cholesterol 237, HDL 36, LDL 156, triglycerides 224 5.  Anemia -Hemoglobin 9.4  Past Medical History: Past Medical History:  Diagnosis Date  . Anemia   . Anxiety  Dx 2008  . Cellulitis   . Chronic kidney disease   . Diabetes mellitus without complication ([place]) Dx 5462  . DKA (diabetic ketoacidoses) ([place]) 07/21/2017  . Endocarditis 04/2019  . Endocarditis   . GERD (gastroesophageal reflux disease) Dx 2008  . Headache(784.0)   . Heart murmur   . Hepatitis C   . Hypertension   . Hypothyroidism   . MRSA infection   . Pneumonia   . Seizures ([place]) 2011    x 2 in lifetime. on Dilantin for a while.   . Sepsis ([place])   . 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 Type 1 diabetes ([place])     Past Surgical History: Past Surgical History:  Procedure Laterality Date  . AMPUTATION Left 09/05/2018   Procedure: LEFT GREAT TOE AMPUTATION;  Surgeon: [name], MD;  Location: [place];  Service: Orthopedics;  Laterality: Left;  . APPLICATION OF A-CELL OF EXTREMITY Bilateral 09/01/2019   Procedure: APPLICATION OF MESHED PRIMATRIX AG OF EXTREMITY;  Surgeon: Cindra Presume, MD;  Location: [facility];  Service: Plastics;  Laterality: Bilateral;  . I & D EXTREMITY Left 10/11/2012   Procedure:  IRRIGATION AND DEBRIDEMENT ABSCESS FOREARM;  Surgeon: Linna Hoff, MD;  Location: [facility];  Service: Orthopedics;  Laterality: Left;  . I & D EXTREMITY Left 10/12/2012   Procedure: IRRIGATION AND DEBRIDEMENT FOREARM;  Surgeon: Linna Hoff, MD;  Location: [facility];  Service: Orthopedics;  Laterality: Left;  . I & D EXTREMITY Left 10/14/2012   Procedure: incision and drainage left forearm;  Surgeon: Linna Hoff, MD;  Location: [facility];  Service: Orthopedics;  Laterality: Left;  . I & D EXTREMITY Left 10/16/2012   Procedure: IRRIGATION AND DEBRIDEMENT LEFT FOREARM;  Surgeon: Linna Hoff, MD;  Location: [facility];  Service: Orthopedics;  Laterality: Left;  . I & D EXTREMITY Left 10/20/2012   Procedure: INCISION AND DRAINAGE AND DEBRIDEMENT LEFT  FOREARM;  Surgeon: Linna Hoff, MD;  Location: [facility];  Service: Orthopedics;  Laterality: Left;  . I & D EXTREMITY Right 05/22/2019   Procedure: IRRIGATION AND DEBRIDEMENT RIGHT ARM;  Surgeon: [name], MD;  Location: [facility];  Service: Orthopedics;  Laterality: Right;  . INCISION AND DRAINAGE OF WOUND Bilateral 11/29/2017   Procedure: DEBRIDEMENT BILATERAL FEET, DEBRIDEMENT LEFT ANKLE, AND APPLY WOUND VAC;  Surgeon: [name], MD;  Location: [facility];  Service: Orthopedics;  Laterality: Bilateral;  . INCISION AND DRAINAGE OF WOUND Bilateral 09/01/2019  Procedure: IRRIGATION AND DEBRIDEMENT FOREARM WOUNDS;  Surgeon: Cindra Presume, MD;  Location: [facility];  Service: Plastics;  Laterality: Bilateral;  . IRRIGATION AND DEBRIDEMENT ABSCESS     Hx: of left arm abscess related to drug use   . TEE WITHOUT CARDIOVERSION N/A 11/28/2017   Procedure: TRANSESOPHAGEAL ECHOCARDIOGRAM (TEE);  Surgeon: Sanda Klein, MD;  Location: [facility];  Service: Cardiovascular;  Laterality: N/A;  . TEE WITHOUT CARDIOVERSION N/A 05/12/2019   Procedure: TRANSESOPHAGEAL ECHOCARDIOGRAM (TEE);  Surgeon: Skeet Latch, MD;  Location: [facility] ENDOSCOPY;  Service: Cardiovascular;   Laterality: N/A;    Current Medications: No outpatient medications have been marked as taking for the 12/10/19 encounter (Appointment) with O'Neal, Cassie Freer, MD.     Allergies:    Patient has no known allergies.   Social History: Social History   Socioeconomic History  . Marital status: Married    Spouse name: Not on file  . Number of children: 6  . Years of education: some colle  . Highest education level: Not on file  Occupational History  . Occupation: Event set up    Employer: [place]   . Occupation: Stencil for cars   Tobacco Use  . Smoking status: Current Every Day Smoker    Packs/day: 1.00    Years: 25.00    Pack years: 25.00    Types: Cigarettes  . Smokeless tobacco: Never Used  . Tobacco comment: decreased over the last few weeks because he has not been breathing well  Vaping Use  . Vaping Use: Some days  Substance and Sexual Activity  . Alcohol use: No    Alcohol/week: 0.0 standard drinks    Comment: rarely  . Drug use: Not Currently    Types: Cocaine, IV, Heroin    Comment: IVDU since Dad died in 06/26/06 with 15 month period of sobriety 2018-2019, relapse Aug 2019.  Marland Kitchen Sexual activity: Yes    Partners: Female    Birth control/protection: None  Other Topics Concern  . Not on file  Social History Narrative   Lives with wife and two children age 73 and 30.          Social Determinants of Health   Financial Resource Strain:   . Difficulty of Paying Living Expenses: Not on file  Food Insecurity:   . Worried About Charity fundraiser in the Last Year: Not on file  . Ran Out of Food in the Last Year: Not on file  Transportation Needs: No Transportation Needs  . Lack of Transportation (Medical): No  . Lack of Transportation (Non-Medical): No  Physical Activity:   . Days of Exercise per Week: Not on file  . Minutes of Exercise per Session: Not on file  Stress:   . Feeling of Stress : Not on file  Social Connections:  Moderately Isolated  . Frequency of Communication with Friends and Family: More than three times a week  . Frequency of Social Gatherings with Friends and Family: More than three times a week  . Attends Religious Services: Never  . Active Member of Clubs or Organizations: No  . Attends Archivist Meetings: Not on file  . Marital Status: Married     Family History: The patient's ***family history includes Diabetes in his father; Heart disease in his father; Mental illness in his sister. There is no history of Cancer.  ROS:   All other ROS reviewed and negative. Pertinent positives noted in the HPI.  EKGs/Labs/Other Studies Reviewed:   The following studies were personally reviewed by me today:  EKG:  EKG is *** ordered today.  The ekg ordered today demonstrates ***, and was personally reviewed by me.   1. Mitral and pulmonic valve vegetations. Endocarditis.  2. Left ventricular ejection fraction, by estimation, is 70 to 75%. The  left ventricle has hyperdynamic function. The left ventricle has no  regional wall motion abnormalities. Left ventricular diastolic parameters  were normal.  3. Right ventricular systolic function is normal. The right ventricular  size is normal.  4. There is a anterior mitral leaflet valvular vegetation (1.1 x 0.6 cm).  The mitral valve is abnormal. Moderate mitral valve regurgitation. No  evidence of mitral stenosis.  5. The aortic valve is normal in structure. Aortic valve regurgitation is  mild. No aortic stenosis is present.  6. Possible vegetation on pulmonic valve. The pulmonic valve was  abnormal.  7. The inferior vena cava is normal in size with greater than 50%  respiratory variability, suggesting right atrial pressure of 3 mmHg.   Recent Labs: 06/23/2019: TSH 1.190 08/21/2019: Magnesium 1.7 08/27/2019: B Natriuretic Peptide 257.6 08/29/2019: ALT 12 09/15/2019: BUN 22; Creatinine, Ser 1.43; Hemoglobin 9.4; Platelets 499;  Potassium 3.8; Sodium 133   Recent Lipid Panel    Component Value Date/Time   CHOL 237 (H) 10/21/2017 1508   CHOL 150 04/11/2014 0054   TRIG 224 (H) 10/21/2017 1508   TRIG 121 04/11/2014 0054   HDL 36 (L) 10/21/2017 1508   HDL 46 04/11/2014 0054   CHOLHDL 6.6 10/21/2017 1508   VLDL 45 (H) 10/21/2017 1508   VLDL 24 04/11/2014 0054   LDLCALC 156 (H) 10/21/2017 1508   LDLCALC 80 04/11/2014 0054    Physical Exam:   VS:  There were no vitals taken for this visit.   Wt Readings from Last 3 Encounters:  10/30/19 159 lb 3.2 oz (72.2 kg)  09/23/19 172 lb (78 kg)  09/10/19 168 lb 10.4 oz (76.5 kg)    General: Well nourished, well developed, in no acute distress Heart: Atraumatic, normal size  Eyes: PEERLA, EOMI  Neck: Supple, no JVD Endocrine: No thryomegaly Cardiac: Normal S1, S2; RRR; no murmurs, rubs, or gallops Lungs: Clear to auscultation bilaterally, no wheezing, rhonchi or rales  Abd: Soft, nontender, no hepatomegaly  Ext: No edema, pulses 2+ Musculoskeletal: No deformities, BUE and BLE strength normal and equal Skin: Warm and dry, no rashes   Neuro: Alert and oriented to person, place, time, and situation, CNII-XII grossly intact, no focal deficits  Psych: Normal mood and affect   ASSESSMENT:   Luke [name] is a 39 y.o. male who presents for the following: No diagnosis found.  PLAN:   There are no diagnoses linked to this encounter.  Disposition: No follow-ups on file.  Medication Adjustments/Labs and Tests Ordered: Current medicines are reviewed at length with the patient today.  Concerns regarding medicines are outlined above.  No orders of the defined types were placed in this encounter.  No orders of the defined types were placed in this encounter.   There are no Patient Instructions on file for this visit.   Time Spent with Patient: I have spent a total of *** minutes with patient reviewing hospital notes, telemetry, EKGs, labs and examining the patient as  well as establishing an assessment and plan that was discussed with the patient.  > 50% of time was spent in direct patient care.  Signed, Addison Naegeli. Audie Box, MD  [facility]  [address] [place], [place] [address] [phone]  12/09/2019 5:43 PM

## 2019-12-10 ENCOUNTER — Ambulatory Visit: Payer: Medicaid Other | Admitting: Cardiovascular Disease

## 2019-12-29 MED FILL — TRUEPLUS PEN NDL 31GX3/16: 31G X 5 MM | 50 days supply | Qty: 100 | Fill #0

## 2019-12-29 MED FILL — LANTUS SOLOSTAR 100 UNITS/M: 100 | 25 days supply | Qty: 15 | Fill #5

## 2019-12-29 MED FILL — HUMALOG 100 UNITS/ML KWIKPE: 100 | 25 days supply | Qty: 15 | Fill #1

## 2020-01-06 MED FILL — ACCU-CHEK GUIDE TEST STRIP: 50 days supply | Qty: 100 | Fill #1

## 2020-01-08 ENCOUNTER — Other Ambulatory Visit: Payer: Self-pay

## 2020-01-08 ENCOUNTER — Telehealth: Payer: Self-pay | Admitting: Nurse Practitioner

## 2020-01-08 MED ORDER — TRUE METRIX METER DEVI: 1.0000 | 0 refills | Status: DC

## 2020-01-08 NOTE — Telephone Encounter (Signed)
Spoke to patient and informed CMA sent a blood glucose meter to the pharmacy. Pt. Is aware.

## 2020-01-08 NOTE — Telephone Encounter (Signed)
Copied from [place] [id number]. Topic: General - Other >> Jan 08, 2020 11:58 AM Rain[place] P[place] A wrote: Patient stated that he needs a new prescription for a new meter sent in for him. Patient stated that he already has enough test strips but his old meter is broken. Patient requesting callback from nurse once new order for prescription is placed. Please advise

## 2020-01-13 ENCOUNTER — Telehealth: Payer: Self-pay | Admitting: Nurse Practitioner

## 2020-01-13 DIAGNOSIS — E1165 Type 2 diabetes mellitus with hyperglycemia: Secondary | ICD-10-CM

## 2020-01-13 DIAGNOSIS — R0602 Shortness of breath: Secondary | ICD-10-CM

## 2020-01-13 DIAGNOSIS — IMO0002 Reserved for concepts with insufficient information to code with codable children: Secondary | ICD-10-CM

## 2020-01-13 NOTE — Telephone Encounter (Signed)
Pt says that his pharmacy received Rx for [employer] meter, pt says that he also need strips for the meter. Pt says his Rx on file is for accuchek strips instead.    Please assist.

## 2020-01-14 ENCOUNTER — Other Ambulatory Visit: Payer: Self-pay | Admitting: Family Medicine

## 2020-01-14 MED ORDER — ACCU-CHEK GUIDE ME W/DEVICE KIT: PACK | 0 refills | Status: AC

## 2020-01-14 MED ORDER — ACCU-CHEK GUIDE VI STRP: ORAL_STRIP | 2 refills | Status: DC

## 2020-01-14 MED ORDER — ACCU-CHEK SOFTCLIX LANCETS MISC: 2 refills | Status: DC

## 2020-01-14 MED FILL — ACCU-CHEK GUIDE TEST STRIP: 33 days supply | Qty: 100 | Fill #0

## 2020-01-14 MED FILL — ACCU-CHEK SOFTCLIX LANCETS: 33 days supply | Qty: 100 | Fill #0

## 2020-01-14 NOTE — Telephone Encounter (Signed)
Rx sent 

## 2020-01-19 MED FILL — ACCU-CHEK GUIDE W/DEVICE KI: W/DEVICE | 1 days supply | Qty: 1 | Fill #0

## 2020-01-29 MED FILL — LANTUS SOLOSTAR 100 UNITS/M: 100 | 25 days supply | Qty: 15 | Fill #6

## 2020-02-29 ENCOUNTER — Other Ambulatory Visit: Payer: Self-pay | Admitting: Physician Assistant

## 2020-02-29 DIAGNOSIS — IMO0002 Reserved for concepts with insufficient information to code with codable children: Secondary | ICD-10-CM

## 2020-02-29 NOTE — Telephone Encounter (Signed)
Requested medication (s) are due for refill today: -  Requested medication (s) are on the active medication list: historical med  Last refill:  09/18/19  Future visit scheduled: no  Notes to clinic:  historical med/provider   Requested Prescriptions  Pending Prescriptions Disp Refills   LANTUS SOLOSTAR 100 UNIT/ML Solostar Pen [Pharmacy Med Name: LANTUS SOLOSTAR 100 UNITS/M 100 Solution Pen-injector] 15 mL 6    Sig: INJECT 30 UNITS INTO THE SKIN 2 (TWO) TIMES DAILY.      Endocrinology:  Diabetes - Insulins Failed - 02/29/2020  9:01 AM      Failed - HBA1C is between 0 and 7.9 and within 180 days    Hemoglobin A1C  Date Value Ref Range Status  04/11/2014 11.3 (H) 4.2 - 6.3 % Final    Comment:    The American Diabetes Association recommends that a primary goal of therapy should be <7% and that physicians should reevaluate the treatment regimen in patients with HbA1c values consistently >8%.    HbA1c POC (<> result, manual entry)  Date Value Ref Range Status  03/24/2018 >15 4.0 - 5.6 % Final   Hgb A1c MFr Bld  Date Value Ref Range Status  08/27/2019 10.8 (H) 4.8 - 5.6 % Final    Comment:    (NOTE) Pre diabetes:          5.7%-6.4%  Diabetes:              >6.4%  Glycemic control for   <7.0% adults with diabetes           Passed - Valid encounter within last 6 months    Recent Outpatient Visits           5 months ago Hospital discharge follow-up   [place] Gildardo Pounds, NP   8 months ago Hypokalemia   [place], Dionne Bucy, [place]   8 months ago Hospital discharge follow-up   [place] Gildardo Pounds, NP   10 months ago Bacterial skin infection   [place], [place] W, NP   1 year ago Uncontrolled insulin dependent type 1 diabetes mellitus [facility])   [place], [name], NP

## 2020-03-03 ENCOUNTER — Other Ambulatory Visit: Payer: Self-pay | Admitting: Nurse Practitioner

## 2020-03-03 ENCOUNTER — Other Ambulatory Visit: Payer: Self-pay | Admitting: Family Medicine

## 2020-03-03 DIAGNOSIS — IMO0002 Reserved for concepts with insufficient information to code with codable children: Secondary | ICD-10-CM

## 2020-03-03 MED ORDER — BASAGLAR KWIKPEN 100 UNIT/ML ~~LOC~~ SOPN: 40.0000 [IU] | PEN_INJECTOR | Freq: Every day | SUBCUTANEOUS | 0 refills | Status: DC

## 2020-03-03 MED FILL — ?BASAGLAR 100 UNITS/ML KWPE: 100 | 8 days supply | Qty: 3 | Fill #0

## 2020-03-03 NOTE — Telephone Encounter (Signed)
Requested medication (s) are due for refill today: yes  Requested medication (s) are on the active medication list: yes  Last refill:  last refilled by historical provider  Future visit scheduled: yes, 03/04/20  Notes to clinic:  Please review for refill. Last filled by historical provider. Patient states he is currently out of the medication.     Requested Prescriptions  Pending Prescriptions Disp Refills   Insulin Glargine (BASAGLAR KWIKPEN) 100 UNIT/ML      Sig: Inject 40 Units into the skin daily.      Endocrinology:  Diabetes - Insulins Failed - 03/03/2020 10:05 AM      Failed - HBA1C is between 0 and 7.9 and within 180 days    Hemoglobin A1C  Date Value Ref Range Status  04/11/2014 11.3 (H) 4.2 - 6.3 % Final    Comment:    The American Diabetes Association recommends that a primary goal of therapy should be <7% and that physicians should reevaluate the treatment regimen in patients with HbA1c values consistently >8%.    HbA1c POC (<> result, manual entry)  Date Value Ref Range Status  03/24/2018 >15 4.0 - 5.6 % Final   Hgb A1c MFr Bld  Date Value Ref Range Status  08/27/2019 10.8 (H) 4.8 - 5.6 % Final    Comment:    (NOTE) Pre diabetes:          5.7%-6.4%  Diabetes:              >6.4%  Glycemic control for   <7.0% adults with diabetes           Passed - Valid encounter within last 6 months    Recent Outpatient Visits           5 months ago Hospital discharge follow-up   [place], Zelda W, NP   8 months ago Hypokalemia   [place], Dionne [place], [place]   8 months ago Hospital discharge follow-up   [place] Gildardo Pounds, NP   10 months ago Bacterial skin infection   [place], [place] W, NP   1 year ago Uncontrolled insulin dependent type 1 diabetes mellitus [facility])   [place], [name], NP       Future Appointments             Tomorrow Gildardo Pounds, NP [place]

## 2020-03-03 NOTE — Telephone Encounter (Signed)
Pt ran out of his Lantus  And stated he needs a refill for Insulin Glargine (BASAGLAR KWIKPEN) 100 UNIT/ML Pt would like an Rx sent to [facility] today / Pt hs scheduled a follow up appt with Zelda for tomorrow / please advise

## 2020-03-04 ENCOUNTER — Ambulatory Visit: Payer: Medicaid Other | Attending: Nurse Practitioner

## 2020-03-04 ENCOUNTER — Other Ambulatory Visit: Payer: Self-pay

## 2020-03-04 ENCOUNTER — Other Ambulatory Visit: Payer: Self-pay | Admitting: Nurse Practitioner

## 2020-03-04 ENCOUNTER — Ambulatory Visit: Payer: Medicaid Other | Admitting: Nurse Practitioner

## 2020-03-04 DIAGNOSIS — E785 Hyperlipidemia, unspecified: Secondary | ICD-10-CM

## 2020-03-04 DIAGNOSIS — D649 Anemia, unspecified: Secondary | ICD-10-CM

## 2020-03-04 DIAGNOSIS — IMO0002 Reserved for concepts with insufficient information to code with codable children: Secondary | ICD-10-CM

## 2020-03-04 DIAGNOSIS — E039 Hypothyroidism, unspecified: Secondary | ICD-10-CM

## 2020-03-04 DIAGNOSIS — I10 Essential (primary) hypertension: Secondary | ICD-10-CM

## 2020-03-06 LAB — CBC WITH DIFFERENTIAL/PLATELET
Basophils Absolute: 0.1 10*3/uL (ref 0.0–0.2)
Basos: 1 %
EOS (ABSOLUTE): 0.4 10*3/uL (ref 0.0–0.4)
Eos: 3 %
Hematocrit: 38.5 % (ref 37.5–51.0)
Hemoglobin: 12.6 g/dL — ABNORMAL LOW (ref 13.0–17.7)
Immature Grans (Abs): 0 10*3/uL (ref 0.0–0.1)
Immature Granulocytes: 0 %
Lymphocytes Absolute: 5.5 10*3/uL — ABNORMAL HIGH (ref 0.7–3.1)
Lymphs: 44 %
MCH: 27.8 pg (ref 26.6–33.0)
MCHC: 32.7 g/dL (ref 31.5–35.7)
MCV: 85 fL (ref 79–97)
Monocytes Absolute: 1 10*3/uL — ABNORMAL HIGH (ref 0.1–0.9)
Monocytes: 8 %
Neutrophils Absolute: 5.5 10*3/uL (ref 1.4–7.0)
Neutrophils: 44 %
Platelets: 440 10*3/uL (ref 150–450)
RBC: 4.53 x10E6/uL (ref 4.14–5.80)
RDW: 14.7 % (ref 11.6–15.4)
WBC: 12.5 10*3/uL — ABNORMAL HIGH (ref 3.4–10.8)

## 2020-03-06 LAB — MICROALBUMIN / CREATININE URINE RATIO
Creatinine, Urine: 111.8 mg/dL
Microalb/Creat Ratio: 798 mg/g creat — ABNORMAL HIGH (ref 0–29)
Microalbumin, Urine: 892 ug/mL

## 2020-03-06 LAB — CMP14+EGFR
ALT: 29 IU/L (ref 0–44)
AST: 30 IU/L (ref 0–40)
Albumin/Globulin Ratio: 2 (ref 1.2–2.2)
Albumin: 4.7 g/dL (ref 4.0–5.0)
Alkaline Phosphatase: 146 IU/L — ABNORMAL HIGH (ref 44–121)
BUN/Creatinine Ratio: 12 (ref 9–20)
BUN: 18 mg/dL (ref 6–20)
Bilirubin Total: 0.4 mg/dL (ref 0.0–1.2)
CO2: 25 mmol/L (ref 20–29)
Calcium: 10.4 mg/dL — ABNORMAL HIGH (ref 8.7–10.2)
Chloride: 100 mmol/L (ref 96–106)
Creatinine, Ser: 1.5 mg/dL — ABNORMAL HIGH (ref 0.76–1.27)
GFR calc Af Amer: 67 mL/min/{1.73_m2} (ref >59)
GFR calc non Af Amer: 58 mL/min/{1.73_m2} — ABNORMAL LOW (ref >59)
Globulin, Total: 2.4 g/dL (ref 1.5–4.5)
Glucose: 236 mg/dL — ABNORMAL HIGH (ref 65–99)
Potassium: 6.4 mmol/L — ABNORMAL HIGH (ref 3.5–5.2)
Sodium: 141 mmol/L (ref 134–144)
Total Protein: 7.1 g/dL (ref 6.0–8.5)

## 2020-03-06 LAB — LIPID PANEL
Chol/HDL Ratio: 4.1 ratio (ref 0.0–5.0)
Cholesterol, Total: 244 mg/dL — ABNORMAL HIGH (ref 100–199)
HDL: 60 mg/dL (ref >39)
LDL Chol Calc (NIH): 157 mg/dL — ABNORMAL HIGH (ref 0–99)
Triglycerides: 149 mg/dL (ref 0–149)
VLDL Cholesterol Cal: 27 mg/dL (ref 5–40)

## 2020-03-06 LAB — TSH: TSH: 1.4 u[IU]/mL (ref 0.450–4.500)

## 2020-03-06 LAB — HEMOGLOBIN A1C
Est. average glucose Bld gHb Est-mCnc: 286 mg/dL
Hgb A1c MFr Bld: 11.6 % — ABNORMAL HIGH (ref 4.8–5.6)

## 2020-03-10 ENCOUNTER — Telehealth: Payer: Self-pay | Admitting: Nurse Practitioner

## 2020-03-10 ENCOUNTER — Other Ambulatory Visit: Payer: Self-pay | Admitting: Pharmacist

## 2020-03-10 MED ORDER — LANTUS SOLOSTAR 100 UNIT/ML ~~LOC~~ SOPN: 40.0000 [IU] | PEN_INJECTOR | Freq: Every day | SUBCUTANEOUS | 1 refills | Status: DC

## 2020-03-10 MED FILL — LANTUS SOLOSTAR 100 UNITS/M: 100 | 30 days supply | Qty: 15 | Fill #0

## 2020-03-10 NOTE — Telephone Encounter (Signed)
Copied from [place] [phone]. Topic: General - Other >> Mar 10, 2020  2:39 PM Hinda Lenis D wrote: PT need his medications, completely out // insulin glargine (LANTUS) 100 UNIT/ML injection [[id number]] // please advise

## 2020-03-10 NOTE — Telephone Encounter (Signed)
Rx for Lantus was picked up today from our pharmacy.

## 2020-03-11 ENCOUNTER — Other Ambulatory Visit: Payer: Self-pay | Admitting: Nurse Practitioner

## 2020-03-11 MED ORDER — SODIUM POLYSTYRENE SULFONATE 15 GM/60ML PO SUSP: 30.0000 g | Freq: Once | ORAL | 0 refills | Status: AC

## 2020-03-22 ENCOUNTER — Other Ambulatory Visit: Payer: Self-pay | Admitting: Nurse Practitioner

## 2020-03-22 ENCOUNTER — Other Ambulatory Visit: Payer: Self-pay

## 2020-03-22 ENCOUNTER — Ambulatory Visit: Payer: Medicaid Other | Attending: Nurse Practitioner

## 2020-03-22 DIAGNOSIS — D72829 Elevated white blood cell count, unspecified: Secondary | ICD-10-CM

## 2020-03-22 DIAGNOSIS — E875 Hyperkalemia: Secondary | ICD-10-CM

## 2020-03-22 MED ORDER — SODIUM POLYSTYRENE SULFONATE 15 GM/60ML PO SUSP: 30.0000 g | Freq: Once | ORAL | 0 refills | Status: DC

## 2020-03-22 MED FILL — SPS 15 GM/60 ML SUSPENSION: 15 | 1 days supply | Qty: 120 | Fill #0

## 2020-03-22 NOTE — Telephone Encounter (Signed)
[facility]  CMA resent to [facility] pharmacy. Pt. Will pick it up today.

## 2020-03-28 ENCOUNTER — Other Ambulatory Visit: Payer: Self-pay | Admitting: Nurse Practitioner

## 2020-03-28 MED ORDER — SODIUM POLYSTYRENE SULFONATE PO POWD: Freq: Once | ORAL | 0 refills | Status: AC

## 2020-03-29 ENCOUNTER — Other Ambulatory Visit: Payer: Self-pay | Admitting: Pharmacist

## 2020-03-29 MED ORDER — SODIUM POLYSTYRENE SULFONATE 15 GM/60ML PO SUSP: 15.0000 g | Freq: Once | ORAL | 0 refills | Status: DC

## 2020-03-29 MED FILL — SPS 15 GM/60 ML SUSPENSION: 15 | 1 days supply | Qty: 60 | Fill #0

## 2020-04-01 MED FILL — LANTUS SOLOSTAR 100 UNITS/M: 100 | 37 days supply | Qty: 15 | Fill #1

## 2020-04-10 ENCOUNTER — Other Ambulatory Visit: Payer: Self-pay

## 2020-04-10 ENCOUNTER — Ambulatory Visit (HOSPITAL_COMMUNITY)
Admission: EM | Admit: 2020-04-10 | Discharge: 2020-04-10 | Disposition: A | Discharge status: Home / Self Care | Payer: Medicaid Other | Attending: Student | Admitting: Student

## 2020-04-10 ENCOUNTER — Encounter (HOSPITAL_COMMUNITY): Payer: Self-pay

## 2020-04-10 DIAGNOSIS — R011 Cardiac murmur, unspecified: Secondary | ICD-10-CM | POA: Diagnosis not present

## 2020-04-10 DIAGNOSIS — K047 Periapical abscess without sinus: Secondary | ICD-10-CM

## 2020-04-10 MED ORDER — LIDOCAINE VISCOUS HCL 2 % MT SOLN: 15.0000 mL | OROMUCOSAL | 0 refills | Status: DC | PRN

## 2020-04-10 MED ORDER — AMOXICILLIN-POT CLAVULANATE 875-125 MG PO TABS
1.0000 | ORAL_TABLET | Freq: Two times a day (BID) | ORAL | 0 refills | Status: DC
Start: 2020-04-10 — End: 2020-10-02

## 2020-04-10 NOTE — ED Triage Notes (Signed)
Pt presents with left side dental pain since yesterday.

## 2020-04-10 NOTE — ED Provider Notes (Signed)
[place]    CSN: [id number] Arrival date & time: 04/10/20  1641      History   Chief Complaint Chief Complaint  Patient presents with  . Dental Pain    HPI Luke [name] is a 40 y.o. male presenting for left-sided dental pain x12 hours. States he woke up like this. Is in the process of having dental work done on bilateral upper and lower molars- states he needs root canals on the left side but hasn't had this done yet. Recently had 3 right-sided root canals. States he plans to call his oral surgeon tomorrow morning. Denies fevers/chills. History endocarditis and systolic heart murmur, followed by cardiologist. Denies chest pain, shortness of breath today.  HPI  Past Medical History:  Diagnosis Date  . Anemia   . Anxiety  Dx 2008  . Cellulitis   . Chronic kidney disease   . Diabetes mellitus without complication ([place]) Dx 9379  . DKA (diabetic ketoacidoses) 07/21/2017  . Endocarditis 04/2019  . Endocarditis   . GERD (gastroesophageal reflux disease) Dx 2008  . Headache(784.0)   . Heart murmur   . Hepatitis C   . Hypertension   . Hypothyroidism   . MRSA infection   . Pneumonia   . Seizures ([place]) 2011    x 2 in lifetime. on Dilantin for a while.   . Sepsis ([place])   . Substance abuse ([place]) 2013    heroin use, multiple relapses  . Type 1 diabetes [facility])     Patient Active Problem List   Diagnosis Date Noted  . 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 Congestive heart failure (CHF) ([place]) 08/27/2019  . Chest pain 08/20/2019  . Elevated troponin 08/20/2019  . Abscess of right forearm   . 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 Acute bacterial endocarditis   . 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 Cellulitis of forearms   . Hypothyroidism   . Diabetic ulcer of left foot ([place]) 08/28/2018  . Hepatitis C 11/25/2017  . GERD (gastroesophageal reflux disease) 07/21/2017  . Diabetic polyneuropathy associated with type 1 diabetes mellitus ([place])   .  Noncompliant bladder   . Urinary retention   . ARF (acute renal failure) ([place]) 12/16/2016  . Anemia 12/16/2016  . Cocaine abuse ([place]) 08/16/2015  . Uncontrolled insulin dependent type 1 diabetes mellitus ([place]) 08/16/2015  . Cellulitis 12/03/2014  . AKI (acute kidney injury) ([place]) 12/03/2014  . Elevated liver enzymes 07/26/2014  . CKD (chronic kidney disease) stage 3, GFR 30-59 ml/min ([name]) 10/14/2012  . 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 Tobacco abuse 10/11/2012  . Essential hypertension 10/11/2012    Past Surgical History:  Procedure Laterality Date  . AMPUTATION Left 09/05/2018   Procedure: LEFT GREAT TOE AMPUTATION;  Surgeon: [name], MD;  Location: [place];  Service: Orthopedics;  Laterality: Left;  . APPLICATION OF A-CELL OF EXTREMITY Bilateral 09/01/2019   Procedure: APPLICATION OF MESHED PRIMATRIX AG OF EXTREMITY;  Surgeon: Cindra Presume, MD;  Location: [place];  Service: Plastics;  Laterality: Bilateral;  . I & D EXTREMITY Left 10/11/2012   Procedure: IRRIGATION AND DEBRIDEMENT ABSCESS FOREARM;  Surgeon: Linna Hoff, MD;  Location: [place];  Service: Orthopedics;  Laterality: Left;  . I & D EXTREMITY Left 10/12/2012   Procedure: IRRIGATION AND DEBRIDEMENT FOREARM;  Surgeon: Linna Hoff, MD;  Location: [place];  Service: Orthopedics;  Laterality: Left;  . I & D EXTREMITY Left 10/14/2012   Procedure: incision  and drainage left forearm;  Surgeon: Linna Hoff, MD;  Location: [place];  Service: Orthopedics;  Laterality: Left;  . I & D EXTREMITY Left 10/16/2012   Procedure: IRRIGATION AND DEBRIDEMENT LEFT FOREARM;  Surgeon: Linna Hoff, MD;  Location: [place];  Service: Orthopedics;  Laterality: Left;  . I & D EXTREMITY Left 10/20/2012   Procedure: INCISION AND DRAINAGE AND DEBRIDEMENT LEFT  FOREARM;  Surgeon: Linna Hoff, MD;  Location: [place];  Service: Orthopedics;  Laterality: Left;  . I & D EXTREMITY Right 05/22/2019   Procedure: IRRIGATION AND DEBRIDEMENT RIGHT ARM;  Surgeon:  [name], MD;  Location: [place];  Service: Orthopedics;  Laterality: Right;  . INCISION AND DRAINAGE OF WOUND Bilateral 11/29/2017   Procedure: DEBRIDEMENT BILATERAL FEET, DEBRIDEMENT LEFT ANKLE, AND APPLY WOUND VAC;  Surgeon: [name], MD;  Location: [place];  Service: Orthopedics;  Laterality: Bilateral;  . INCISION AND DRAINAGE OF WOUND Bilateral 09/01/2019   Procedure: IRRIGATION AND DEBRIDEMENT FOREARM WOUNDS;  Surgeon: Cindra Presume, MD;  Location: [place];  Service: Plastics;  Laterality: Bilateral;  . IRRIGATION AND DEBRIDEMENT ABSCESS     Hx: of left arm abscess related to drug use   . TEE WITHOUT CARDIOVERSION N/A 11/28/2017   Procedure: TRANSESOPHAGEAL ECHOCARDIOGRAM (TEE);  Surgeon: Sanda Klein, MD;  Location: [place];  Service: Cardiovascular;  Laterality: N/A;  . TEE WITHOUT CARDIOVERSION N/A 05/12/2019   Procedure: TRANSESOPHAGEAL ECHOCARDIOGRAM (TEE);  Surgeon: Skeet Latch, MD;  Location: [place];  Service: Cardiovascular;  Laterality: N/A;       Home Medications    Prior to Admission medications   Medication Sig Start Date End Date Taking? Authorizing Provider  amoxicillin-clavulanate (AUGMENTIN) 875-125 MG tablet Take 1 tablet by mouth every 12 (twelve) hours. 04/10/20  Yes Hazel Sams, PA-C  lidocaine (XYLOCAINE) 2 % solution Use as directed 15 mLs in the mouth or throat as needed for mouth pain. 04/10/20  Yes Hazel Sams, PA-C  Accu-Chek Softclix Lancets lancets Use to check blood sugar TID. E11.65 01/14/20   Charlott Rakes, MD  albuterol (VENTOLIN HFA) 108 (90 Base) MCG/ACT inhaler Inhale 2 puffs into the lungs every 6 (six) hours as needed for wheezing or shortness of breath. 09/18/19   Gildardo Pounds, NP  amLODipine (NORVASC) 5 MG tablet Take 5 mg by mouth daily.    [provider]  aspirin EC 81 MG tablet Take 1 tablet (81 mg total) by mouth daily. Swallow whole. 10/30/19   O'NealCassie Freer, MD  atorvastatin (LIPITOR) 20  MG tablet Take 1 tablet (20 mg total) by mouth daily. Patient not taking: Reported on 11/18/2019 09/18/19   Gildardo Pounds, NP  Bismuth Tribromoph-Petrolatum (XEROFORM PETROLATUM ROLL 2"T5') MISC Apply topically to affected area as prescribed 09/18/19   Gildardo Pounds, NP  Blood Glucose Monitoring Suppl (ACCU-CHEK GUIDE ME) w/Device KIT Use to check blood sugar TID. E11.65 01/14/20   Charlott Rakes, MD  carvedilol (COREG) 6.25 MG tablet Take 1 tablet (6.25 mg total) by mouth 2 (two) times daily with a meal. 09/18/19 12/17/19  Gildardo Pounds, NP  DULoxetine (CYMBALTA) 30 MG capsule Take 1 capsule (30 mg total) by mouth daily. 09/18/19 12/17/19  Gildardo Pounds, NP  ferrous sulfate 325 (65 FE) MG tablet Take 1 tablet (325 mg total) by mouth daily with breakfast. 09/18/19   Gildardo Pounds, NP  furosemide (LASIX) 20 MG tablet Take 1 tablet (20 mg total) by  mouth daily for 14 days. 10/30/19 11/18/19  O'NealCassie Freer, MD  Gauze Pads & Dressings [facility] BANDAGE ROLL) MISC Apply to affected area as prescribed 09/18/19   Gildardo Pounds, NP  glucose blood (ACCU-CHEK GUIDE) test strip Use to check blood sugar TID. E11.65 01/14/20   Charlott Rakes, MD  insulin glargine (LANTUS SOLOSTAR) 100 UNIT/ML Solostar Pen Inject 40 Units into the skin daily. 03/10/20   Charlott Rakes, MD  insulin lispro (HUMALOG KWIKPEN) 100 UNIT/ML KwikPen Inject 0.04-0.2 mLs (4-20 Units total) into the skin 3 (three) times daily. Per sliding scale Patient taking differently: Inject 5 Units into the skin See admin instructions. Take 5 units at meal time and sliding scale through out the day Above 200  Take 2 units up to 400 units take 14 units 09/18/19   Gildardo Pounds, NP  Insulin Pen Needle (TRUEPLUS 5-BEVEL PEN NEEDLES) 32G X 4 MM MISC Use as instructed to inject insulin. 11/27/19   Charlott Rakes, MD  levothyroxine (SYNTHROID) 50 MCG tablet Take 1 tablet (50 mcg total) by mouth daily. 09/18/19   Gildardo Pounds, NP  lisinopril  (ZESTRIL) 2.5 MG tablet Take 1 tablet (2.5 mg total) by mouth daily. 10/30/19 01/28/20  Geralynn Rile, MD  Melatonin 5 MG CHEW Chew 5 mg by mouth at bedtime.     [provider]  omeprazole (PRILOSEC) 20 MG capsule Take 1 capsule (20 mg total) by mouth daily. 09/18/19 12/17/19  Gildardo Pounds, NP  sertraline (ZOLOFT) 50 MG tablet Take 1 tablet (50 mg total) by mouth daily. 09/18/19   Gildardo Pounds, NP  traZODone (DESYREL) 50 MG tablet Take 1 tablet (50 mg total) by mouth at bedtime. 09/18/19   Gildardo Pounds, NP    Family History Family History  Problem Relation Age of Onset  . Diabetes Father   . Heart disease Father   . Mental illness Sister   . Cancer Neg Hx     Social History Social History   Tobacco Use  . Smoking status: Current Every Day Smoker    Packs/day: 1.00    Years: 25.00    Pack years: 25.00    Types: Cigarettes  . Smokeless tobacco: Never Used  . Tobacco comment: decreased over the last few weeks because he has not been breathing well  Vaping Use  . Vaping Use: Some days  Substance Use Topics  . Alcohol use: No    Alcohol/week: 0.0 standard drinks    Comment: rarely  . Drug use: Not Currently    Types: Cocaine, IV, Heroin    Comment: IVDU since Dad died in 05/10/06 with 15 month period of sobriety 2018-2019, relapse Aug 2019.     Allergies   Patient has no known allergies.   Review of Systems Review of Systems  HENT: Positive for dental problem.      Physical Exam Triage Vital Signs ED Triage Vitals  Enc Vitals Group     BP 04/10/20 1731 (!) 144/85     Pulse Rate 04/10/20 1731 (!) 115     Resp 04/10/20 1731 20     Temp 04/10/20 1731 98.6 F (37 C)     Temp Source 04/10/20 1731 Oral     SpO2 04/10/20 1731 100 %     Weight --      Height --      Head Circumference --      Peak Flow --      Pain Score 04/10/20 1730  9     Pain Loc --      Pain Edu? --      Excl. in [place]? --    No data found.  Updated Vital Signs BP (!)  144/85 (BP Location: Right Arm)   Pulse (!) 115   Temp 98.6 F (37 C) (Oral)   Resp 20   SpO2 100%   Visual Acuity Right Eye Distance:   Left Eye Distance:   Bilateral Distance:    Right Eye Near:   Left Eye Near:    Bilateral Near:     Physical Exam Vitals reviewed.  Constitutional:      General: He is not in acute distress.    Appearance: Normal appearance. He is not ill-appearing.  HENT:     Head: Normocephalic and atraumatic.     Jaw: No tenderness, swelling, pain on movement or malocclusion.     Salivary Glands: Right salivary gland is not diffusely enlarged or tender. Left salivary gland is not diffusely enlarged or tender.     Comments: L inner posterior cheek with tenderness erythema and swelling     Right Ear: Hearing, tympanic membrane, ear canal and external ear normal. No decreased hearing noted. No drainage, swelling or tenderness. No middle ear effusion. There is no impacted cerumen. No foreign body. No mastoid tenderness. Tympanic membrane is not scarred, perforated, erythematous, retracted or bulging.     Left Ear: Hearing, tympanic membrane, ear canal and external ear normal. No decreased hearing noted. No drainage, swelling or tenderness.  No middle ear effusion. There is no impacted cerumen. No foreign body. No mastoid tenderness. Tympanic membrane is not scarred, perforated, erythematous, retracted or bulging.     Nose: Nose normal.     Mouth/Throat:     Lips: Pink.     Mouth: Mucous membranes are moist. No injury, lacerations, oral lesions or angioedema.     Dentition: Normal dentition. Does not have dentures. No dental tenderness, gingival swelling, dental caries, dental abscesses or gum lesions.     Tongue: No lesions.     Palate: No mass and lesions.     Pharynx: No pharyngeal swelling, oropharyngeal exudate, posterior oropharyngeal erythema or uvula swelling.     Tonsils: No tonsillar exudate or tonsillar abscesses.  Cardiovascular:     Rate and Rhythm:  Normal rate and regular rhythm.     Heart sounds: Murmur heard.   Systolic murmur is present with a grade of 3/6.   Pulmonary:     Effort: Pulmonary effort is normal.     Breath sounds: Normal breath sounds.  Neurological:     General: No focal deficit present.     Mental Status: He is alert and oriented to person, place, and time.  Psychiatric:        Mood and Affect: Mood normal.        Behavior: Behavior normal.        Thought Content: Thought content normal.        Judgment: Judgment normal.      UC Treatments / Results  Labs (all labs ordered are listed, but only abnormal results are displayed) Labs Reviewed - No data to display  EKG   Radiology No results found.  Procedures Procedures (including critical care time)  Medications Ordered in UC Medications - No data to display  Initial Impression / Assessment and Plan / UC Course  I have reviewed the triage vital signs and the nursing notes.  Pertinent labs & imaging results that  were available during my care of the patient were reviewed by me and considered in my medical decision making (see chart for details).     3/6 systolic murmur at baseline. Continue to follow with cardiologist. Head straight to ED if chest pain.   For dental pain, augmentin and lidocaine mouthwash as below.  Final Clinical Impressions(s) / UC Diagnoses   Final diagnoses:  Dental infection     Discharge Instructions     -For dental pain use lidocaine mouthwash up to every 4 hours. Make sure not to eat for at least 1 hour after using this, as your mouth will be very numb and you could bite yourself. -Augmentin twice daily x7 days -Call your oral surgeon tomorrow    ED Prescriptions    Medication Sig Dispense Auth. Provider   amoxicillin-clavulanate (AUGMENTIN) 875-125 MG tablet Take 1 tablet by mouth every 12 (twelve) hours. 14 tablet Marin Roberts E, PA-C   lidocaine (XYLOCAINE) 2 % solution Use as directed 15 mLs in the  mouth or throat as needed for mouth pain. 100 mL Hazel Sams, PA-C     I have reviewed the PDMP during this encounter.   Hazel Sams, PA-C 04/10/20 1757

## 2020-04-10 NOTE — Discharge Instructions (Signed)
-  For dental pain use lidocaine mouthwash up to every 4 hours. Make sure not to eat for at least 1 hour after using this, as your mouth will be very numb and you could bite yourself. -Augmentin twice daily x7 days -Call your oral surgeon tomorrow

## 2020-04-26 ENCOUNTER — Ambulatory Visit: Payer: Medicaid Other | Admitting: Nurse Practitioner

## 2020-04-26 ENCOUNTER — Encounter: Payer: Self-pay | Admitting: *Deleted

## 2020-04-26 ENCOUNTER — Other Ambulatory Visit: Payer: Self-pay | Admitting: Family Medicine

## 2020-04-26 NOTE — Telephone Encounter (Signed)
Pt called to report that he is completely out of his current supply 

## 2020-04-26 NOTE — Telephone Encounter (Signed)
Requested medication (s) are due for refill today: yes  Requested medication (s) are on the active medication list: yes  Last refill:  03/10/20 37m with 1 refill  Future visit scheduled: no  Notes to clinic:  Please review for refill. Unclear if patient was seen for appt on today    Requested Prescriptions  Pending Prescriptions Disp Refills   LANTUS SOLOSTAR 100 UNIT/ML Solostar Pen [Pharmacy Med Name: LANTUS SOLOSTAR 100 UNITS/M 100 Solution Pen-injector] 15 mL 1    Sig: Inject 40 Units into the skin daily.      Endocrinology:  Diabetes - Insulins Failed - 04/26/2020  2:55 PM      Failed - HBA1C is between 0 and 7.9 and within 180 days    Hemoglobin A1C  Date Value Ref Range Status  04/11/2014 11.3 (H) 4.2 - 6.3 % Final    Comment:    The American Diabetes Association recommends that a primary goal of therapy should be <7% and that physicians should reevaluate the treatment regimen in patients with HbA1c values consistently >8%.    HbA1c POC (<> result, manual entry)  Date Value Ref Range Status  03/24/2018 >15 4.0 - 5.6 % Final   Hgb A1c MFr Bld  Date Value Ref Range Status  03/04/2020 11.6 (H) 4.8 - 5.6 % Final    Comment:             Prediabetes: 5.7 - 6.4          Diabetes: >6.4          Glycemic control for adults with diabetes: <7.0           Failed - Valid encounter within last 6 months    Recent Outpatient Visits           7 months ago Hospital discharge follow-up   [place] W, NP   10 months ago [place]okalemia   [place]  10 months ago Hospital discharge follow-up   [place] W, NP   1 year ago Bacterial skin infection   [place]   1 year ago Uncontrolled insulin dependent type 1 diabetes mellitus ([facility]   [place] Buff [place]

## 2020-04-27 ENCOUNTER — Other Ambulatory Visit: Payer: Self-pay | Admitting: Family Medicine

## 2020-04-29 ENCOUNTER — Telehealth: Payer: Self-pay | Admitting: Nurse Practitioner

## 2020-04-29 MED FILL — LANTUS SOLOSTAR 100 UNITS/M: 100 | 37 days supply | Qty: 15 | Fill #0

## 2020-04-29 NOTE — Telephone Encounter (Signed)
Called patient to clarify and confirm message. Per patient, he has been taking Lantus 40u BID.  He denies hypoglycemic episodes.  His f/u is 06/10/2020  Can you confirm the sig. To relay message to patient. [place] will refill today.

## 2020-04-29 NOTE — Telephone Encounter (Signed)
Patient advised to take insulin as directed,  Once daily.  He stated that when he takes insulin daily he has to use the restroom at night a lot.  Informed pt to monitor diet, stay active, and take insulin as directed, keep appointments. He was reminded of appts.  Patient verbalized understanding.

## 2020-04-29 NOTE — Telephone Encounter (Signed)
  [facility] received a call from Mr  zelda patient  he said that zelda told him 2 take twice a day the lantus  he is already run out but in the rx only said 1 time  and he is very upset with  [facility] lady  . because here at [facility] don't want to give him a refill  . Please, call him at  [phone] Thank you

## 2020-05-31 ENCOUNTER — Telehealth: Payer: Self-pay | Admitting: Family Medicine

## 2020-05-31 NOTE — Telephone Encounter (Signed)
Requested medication (s) are due for refill today: yes  Requested medication (s) are on the active medication list: yes  Last refill:  04/27/20 15 ml  Future visit scheduled: yes  Notes to clinic:  pt has appt 06/10/20- note on previous refill stated no OV until office visit - please review   Requested Prescriptions  Pending Prescriptions Disp Refills   LANTUS SOLOSTAR 100 UNIT/ML Solostar Pen [Pharmacy Med Name: LANTUS SOLOSTAR 100 UNITS/M 100 Solution Pen-injector] 15 mL 0    Sig: Inject 40 Units into the skin daily. Must keep upcoming office visit for refills      Endocrinology:  Diabetes - Insulins Failed - 05/31/2020  4:40 PM      Failed - HBA1C is between 0 and 7.9 and within 180 days    Hemoglobin A1C  Date Value Ref Range Status  04/11/2014 11.3 (H) 4.2 - 6.3 % Final    Comment:    The American Diabetes Association recommends that a primary goal of therapy should be <7% and that physicians should reevaluate the treatment regimen in patients with HbA1c values consistently >8%.    HbA1c POC (<> result, manual entry)  Date Value Ref Range Status  03/24/2018 >15 4.0 - 5.6 % Final   Hgb A1c MFr Bld  Date Value Ref Range Status  03/04/2020 11.6 (H) 4.8 - 5.6 % Final    Comment:             Prediabetes: 5.7 - 6.4          Diabetes: >6.4          Glycemic control for adults with diabetes: <7.0           Failed - Valid encounter within last 6 months    Recent Outpatient Visits           8 months ago Hospital discharge follow-up   [place], Zelda W, NP   11 months ago Hypokalemia   [place], [place]   11 months ago Hospital discharge follow-up   [place], Zelda W, NP   1 year ago Bacterial skin infection   [place], [place] W, NP   1 year ago Uncontrolled insulin dependent type 1 diabetes mellitus  [facility])   [place] Gildardo Pounds, NP       Future Appointments             In 1 week Gildardo Pounds, NP [place]

## 2020-06-01 NOTE — Telephone Encounter (Signed)
Pt states he is out of his insulin glargine (LANTUS SOLOSTAR) 100 UNIT/ML Solostar Pen  And if he does not take he will die.  Pt has appt 06/10/20 at 1:30 pm  [place], [place] [address] [employer]

## 2020-06-02 ENCOUNTER — Telehealth: Payer: Self-pay | Admitting: Nurse Practitioner

## 2020-06-02 ENCOUNTER — Other Ambulatory Visit: Payer: Self-pay | Admitting: Pharmacist

## 2020-06-02 MED ORDER — LANTUS SOLOSTAR 100 UNIT/ML ~~LOC~~ SOPN: 40.0000 [IU] | PEN_INJECTOR | Freq: Every day | SUBCUTANEOUS | 0 refills | Status: DC

## 2020-06-10 ENCOUNTER — Ambulatory Visit: Payer: Medicaid Other | Attending: Nurse Practitioner | Admitting: Nurse Practitioner

## 2020-06-10 ENCOUNTER — Other Ambulatory Visit: Payer: Self-pay

## 2020-06-10 ENCOUNTER — Encounter: Payer: Self-pay | Admitting: Nurse Practitioner

## 2020-06-10 DIAGNOSIS — Z8679 Personal history of other diseases of the circulatory system: Secondary | ICD-10-CM | POA: Diagnosis not present

## 2020-06-10 DIAGNOSIS — E785 Hyperlipidemia, unspecified: Secondary | ICD-10-CM | POA: Diagnosis not present

## 2020-06-10 DIAGNOSIS — Z9189 Other specified personal risk factors, not elsewhere classified: Secondary | ICD-10-CM

## 2020-06-10 DIAGNOSIS — E119 Type 2 diabetes mellitus without complications: Secondary | ICD-10-CM

## 2020-06-10 DIAGNOSIS — Z794 Long term (current) use of insulin: Secondary | ICD-10-CM

## 2020-06-10 NOTE — Progress Notes (Signed)
Virtual Visit via Telephone Note Due to national recommendations of social distancing due to [place] 19, telehealth visit is felt to be most appropriate for this patient at this time.  I discussed the limitations, risks, security and privacy concerns of performing an evaluation and management service by telephone and the availability of in person appointments. I also discussed with the patient that there may be a patient responsible charge related to this service. The patient expressed understanding and agreed to proceed.    I connected with Luke [name] on 06/10/20  at   1:30 PM EDT  EDT by telephone and verified that I am speaking with the correct person using two identifiers.     Consent I discussed the limitations, risks, security and privacy concerns of performing an evaluation and management service by telephone and the availability of in person appointments. I also discussed with the patient that there may be a patient responsible charge related to this service. The patient expressed understanding and agreed to proceed.   Location of Patient: Private Residence   Location of Provider: [place] [facility] [employer] Office    Persons participating in Telemedicine visit: Geryl Rankins FNP-BC [place]    History of Present Illness: Telemedicine visit for: Follow up He has a past medical history of Anemia, Anxiety ( Dx 2008), Cellulitis, Chronic HEP C, CKD, IDDM (Dx 2008), Endocarditis (04/2019), GERD (Dx 2008),  Heart murmur, Hepatitis C, Hypertension, Hypothyroidism, MRSA infection, Pneumonia, Seizures (2011 ), Substance abuse (2013 )  He has a history of Endocarditis and CHF. It does not appear there was a follow up TEE. He needs to be scheduled with Cardiology for further evaluation. History of heroin abuse   IDDM Poorly controlled. Does not monitor his blood glucose levels daily. Taking lantus 40 units in the am and 40 units in the pm. Mostly monitors his  blood glucose levels in the mornings with average readings 180s. He is also adminsitering SSI humalog 4-20 units TID and was instructed to administer 5 units TID along with SSI instructions. He is on renal dose ACE.LDL not at goal. I am increasing atorvastatin to 40 mg daily from 20 mg daily. Hyperglycemic symptoms include peripheral neuropathy for which he takes cymbalta 30 mg daily. Complications of his diabetes include  foot infection requiring debridement and wound vac placement.  Lab Results  Component Value Date   HGBA1C 11.6 (H) 03/04/2020   Lab Results  Component Value Date   LDLCALC 157 (H) 03/04/2020    Past Medical History:  Diagnosis Date  . Anemia   . Anxiety  Dx 2008  . Cellulitis   . Chronic kidney disease   . Diabetes mellitus without complication ([place]) Dx [id number]  . DKA (diabetic ketoacidoses) 07/21/2017  . Endocarditis 04/2019  . Endocarditis   . GERD (gastroesophageal reflux disease) Dx 2008  . Headache(784.0)   . Heart murmur   . Hepatitis C   . Hypertension   . Hypothyroidism   . MRSA infection   . Pneumonia   . Seizures ([place]) 2011    x 2 in lifetime. on Dilantin for a while.   . Sepsis ([place])   . Substance abuse ([place]) 2013    heroin use, multiple relapses  . Type 1 diabetes [facility])     Past Surgical History:  Procedure Laterality Date  . AMPUTATION Left 09/05/2018   Procedure: LEFT GREAT TOE AMPUTATION;  Surgeon: [name], MD;  Location: [place];  Service: Orthopedics;  Laterality: Left;  . APPLICATION OF A-CELL OF EXTREMITY Bilateral 09/01/2019   Procedure: APPLICATION OF MESHED PRIMATRIX AG OF EXTREMITY;  Surgeon: Cindra Presume, MD;  Location: [place];  Service: Plastics;  Laterality: Bilateral;  . I & D EXTREMITY Left 10/11/2012   Procedure: IRRIGATION AND DEBRIDEMENT ABSCESS FOREARM;  Surgeon: Linna Hoff, MD;  Location: [place];  Service: Orthopedics;  Laterality: Left;  . I & D EXTREMITY Left 10/12/2012   Procedure: IRRIGATION AND DEBRIDEMENT  FOREARM;  Surgeon: Linna Hoff, MD;  Location: [place];  Service: Orthopedics;  Laterality: Left;  . I & D EXTREMITY Left 10/14/2012   Procedure: incision and drainage left forearm;  Surgeon: Linna Hoff, MD;  Location: [place];  Service: Orthopedics;  Laterality: Left;  . I & D EXTREMITY Left 10/16/2012   Procedure: IRRIGATION AND DEBRIDEMENT LEFT FOREARM;  Surgeon: Linna Hoff, MD;  Location: [place];  Service: Orthopedics;  Laterality: Left;  . I & D EXTREMITY Left 10/20/2012   Procedure: INCISION AND DRAINAGE AND DEBRIDEMENT LEFT  FOREARM;  Surgeon: Linna Hoff, MD;  Location: [place];  Service: Orthopedics;  Laterality: Left;  . I & D EXTREMITY Right 05/22/2019   Procedure: IRRIGATION AND DEBRIDEMENT RIGHT ARM;  Surgeon: [name], MD;  Location: [place];  Service: Orthopedics;  Laterality: Right;  . INCISION AND DRAINAGE OF WOUND Bilateral 11/29/2017   Procedure: DEBRIDEMENT BILATERAL FEET, DEBRIDEMENT LEFT ANKLE, AND APPLY WOUND VAC;  Surgeon: [name], MD;  Location: [place];  Service: Orthopedics;  Laterality: Bilateral;  . INCISION AND DRAINAGE OF WOUND Bilateral 09/01/2019   Procedure: IRRIGATION AND DEBRIDEMENT FOREARM WOUNDS;  Surgeon: Cindra Presume, MD;  Location: [place];  Service: Plastics;  Laterality: Bilateral;  . IRRIGATION AND DEBRIDEMENT ABSCESS     Hx: of left arm abscess related to drug use   . TEE WITHOUT CARDIOVERSION N/A 11/28/2017   Procedure: TRANSESOPHAGEAL ECHOCARDIOGRAM (TEE);  Surgeon: Sanda Klein, MD;  Location: [place];  Service: Cardiovascular;  Laterality: N/A;  . TEE WITHOUT CARDIOVERSION N/A 05/12/2019   Procedure: TRANSESOPHAGEAL ECHOCARDIOGRAM (TEE);  Surgeon: Skeet Latch, MD;  Location: [facility];  Service: Cardiovascular;  Laterality: N/A;    Family History  Problem Relation Age of Onset  . Diabetes Father   . Heart disease Father   . Mental illness Sister   . Cancer Neg Hx     Social History   Socioeconomic History  .  Marital status: Married    Spouse name: Not on file  . Number of children: 6  . Years of education: some colle  . Highest education level: Not on file  Occupational History  . Occupation: Event set up    Employer: [employer]   . Occupation: Stencil for cars   Tobacco Use  . Smoking status: Current Every Day Smoker    Packs/day: 1.00    Years: 25.00    Pack years: 25.00    Types: Cigarettes  . Smokeless tobacco: Never Used  . Tobacco comment: decreased over the last few weeks because he has not been breathing well  Vaping Use  . Vaping Use: Some days  Substance and Sexual Activity  . Alcohol use: No    Alcohol/week: 0.0 standard drinks    Comment: rarely  . Drug use: Not Currently    Types: Cocaine, IV, Heroin    Comment: IVDU since Dad died in Jun 07, 2006 with 15 month period of sobriety  2018-2019, relapse Aug 2019.  Marland Kitchen Sexual activity: Yes    Partners: Female    Birth control/protection: None  Other Topics Concern  . Not on file  Social History Narrative   Lives with wife and two children age 37 and 26.          Social Determinants of Health   Financial Resource Strain: Not on file  Food Insecurity: Not on file  Transportation Needs: No Transportation Needs  . Lack of Transportation (Medical): No  . Lack of Transportation (Non-Medical): No  Physical Activity: Not on file  Stress: Not on file  Social Connections: Moderately Isolated  . Frequency of Communication with Friends and Family: More than three times a week  . Frequency of Social Gatherings with Friends and Family: More than three times a week  . Attends Religious Services: Never  . Active Member of Clubs or Organizations: No  . Attends Archivist Meetings: Not on file  . Marital Status: Married     Observations/Objective: Awake, alert and oriented x 3   Review of Systems  Constitutional: Negative for fever, malaise/fatigue and weight loss.  HENT: Negative.  Negative  for nosebleeds.   Eyes: Negative.  Negative for blurred vision, double vision and photophobia.  Respiratory: Negative.  Negative for cough and shortness of breath.   Cardiovascular: Negative.  Negative for chest pain, palpitations and leg swelling.  Gastrointestinal: Negative.  Negative for heartburn, nausea and vomiting.  Musculoskeletal: Negative.  Negative for myalgias.  Neurological: Positive for sensory change. Negative for dizziness, focal weakness, seizures and headaches.  Psychiatric/Behavioral: Positive for substance abuse. Negative for suicidal ideas.    Assessment and Plan: Pheng was seen today for diabetes.  Diagnoses and all orders for this visit:  Insulin dependent type 2 diabetes mellitus ([place]) Administer 5 units TID and SSI  Dyslipidemia, goal LDL below 70 -     atorvastatin (LIPITOR) 40 MG tablet; Take 1 tablet (40 mg total) by mouth daily. INSTRUCTIONS: Work on a low fat, heart healthy diet and participate in regular aerobic exercise program by working out at least 150 minutes per week; 5 days a week-30 minutes per day. Avoid red meat/beef/steak,  fried foods. junk foods, sodas, sugary drinks, unhealthy snacking, alcohol and smoking.  Drink at least 80 oz of water per day and monitor your carbohydrate intake daily.    History of endocarditis -     Ambulatory referral to Cardiology  At risk for medication nonadherence     Follow Up Instructions Return in about 3 months (around 09/10/2020).     I discussed the assessment and treatment plan with the patient. The patient was provided an opportunity to ask questions and all were answered. The patient agreed with the plan and demonstrated an understanding of the instructions.   The patient was advised to call back or seek an in-person evaluation if the symptoms worsen or if the condition fails to improve as anticipated.  I provided 16 minutes of non-face-to-face time during this encounter including median intraservice  time, reviewing previous notes, labs, imaging, medications and explaining diagnosis and management.  Gildardo Pounds, FNP-BC

## 2020-06-12 ENCOUNTER — Encounter: Payer: Self-pay | Admitting: Nurse Practitioner

## 2020-06-12 MED ORDER — ATORVASTATIN CALCIUM 40 MG PO TABS: 40.0000 mg | ORAL_TABLET | Freq: Every day | ORAL | 3 refills | Status: DC

## 2020-06-18 ENCOUNTER — Other Ambulatory Visit: Payer: Self-pay

## 2020-06-20 ENCOUNTER — Ambulatory Visit: Payer: Medicaid Other | Admitting: Internal Medicine

## 2020-06-20 NOTE — Progress Notes (Deleted)
Name: Luke [name]  MRN/ DOB: [id number], 03/07/1981   Age/ Sex: 40 y.o., male    PCP: Gildardo Pounds, NP   Reason for Endocrinology Evaluation: Type 1 Diabetes Mellitus     Date of Initial Endocrinology Visit: 06/20/2020     PATIENT IDENTIFIER: [name] is a 40 y.o. male with a past medical history of T2DM CHF, HTN , Hx cocaine abuse , and hypothyroidism. The patient presented for initial endocrinology clinic visit on 06/20/2020 for consultative assistance with his diabetes management.    HPI: [name] was    Diagnosed with DM *** Prior Medications tried/Intolerance: *** Currently checking blood sugars *** x / day,  before breakfast and ***.  Hypoglycemia episodes : ***               Symptoms: ***                 Frequency: ***/  Hemoglobin A1c has ranged from 10.0% in 02/2019, peaking at > 15.0% in 12020. Patient required assistance for hypoglycemia:  Patient has required hospitalization within the last 1 year from hyper or hypoglycemia:   In terms of diet, the patient ***   HOME DIABETES REGIMEN: Lantus  Humalog   Statin: yes ACE-I/ARB: yes Prior Diabetic Education: {Yes/No:11203}   METER DOWNLOAD SUMMARY: Date range evaluated: *** Fingerstick Blood Glucose Tests = *** Average Number Tests/Day = *** Overall Mean FS Glucose = *** Standard Deviation = ***  BG Ranges: Low = *** High = ***   Hypoglycemic Events/30 Days: BG < 50 = *** Episodes of symptomatic severe hypoglycemia = ***   DIABETIC COMPLICATIONS: Microvascular complications:   Left amputation,  Denies: CKD  Last eye exam: Completed   Macrovascular complications:   CHF  Denies: CAD, PVD, CVA   PAST HISTORY: Past Medical History:  Past Medical History:  Diagnosis Date  . Anemia   . Anxiety  Dx 2008  . Cellulitis   . Chronic kidney disease   . Diabetes mellitus without complication ([place]) Dx 9924  . DKA (diabetic ketoacidoses) 07/21/2017  . Endocarditis 04/2019  .  Endocarditis   . GERD (gastroesophageal reflux disease) Dx 2008  . Headache(784.0)   . Heart murmur   . Hepatitis C   . Hypertension   . Hypothyroidism   . MRSA infection   . Pneumonia   . Seizures ([place]) 2011    x 2 in lifetime. on Dilantin for a while.   . Sepsis ([place])   . Substance abuse ([place]) 2013    heroin use, multiple relapses  . Type 1 diabetes ([place])     Past Surgical History:  Past Surgical History:  Procedure Laterality Date  . AMPUTATION Left 09/05/2018   Procedure: LEFT GREAT TOE AMPUTATION;  Surgeon: [name], MD;  Location: [place];  Service: Orthopedics;  Laterality: Left;  . APPLICATION OF A-CELL OF EXTREMITY Bilateral 09/01/2019   Procedure: APPLICATION OF MESHED PRIMATRIX AG OF EXTREMITY;  Surgeon: Cindra Presume, MD;  Location: [place];  Service: Plastics;  Laterality: Bilateral;  . I & D EXTREMITY Left 10/11/2012   Procedure: IRRIGATION AND DEBRIDEMENT ABSCESS FOREARM;  Surgeon: Linna Hoff, MD;  Location: [place];  Service: Orthopedics;  Laterality: Left;  . I & D EXTREMITY Left 10/12/2012   Procedure: IRRIGATION AND DEBRIDEMENT FOREARM;  Surgeon: Linna Hoff, MD;  Location: [place];  Service: Orthopedics;  Laterality: Left;  . I & D EXTREMITY Left 10/14/2012   Procedure:  incision and drainage left forearm;  Surgeon: Linna Hoff, MD;  Location: [place];  Service: Orthopedics;  Laterality: Left;  . I & D EXTREMITY Left 10/16/2012   Procedure: IRRIGATION AND DEBRIDEMENT LEFT FOREARM;  Surgeon: Linna Hoff, MD;  Location: [place];  Service: Orthopedics;  Laterality: Left;  . I & D EXTREMITY Left 10/20/2012   Procedure: INCISION AND DRAINAGE AND DEBRIDEMENT LEFT  FOREARM;  Surgeon: Linna Hoff, MD;  Location: [place];  Service: Orthopedics;  Laterality: Left;  . I & D EXTREMITY Right 05/22/2019   Procedure: IRRIGATION AND DEBRIDEMENT RIGHT ARM;  Surgeon: [name], MD;  Location: [place];  Service: Orthopedics;  Laterality: Right;  . INCISION AND DRAINAGE OF WOUND  Bilateral 11/29/2017   Procedure: DEBRIDEMENT BILATERAL FEET, DEBRIDEMENT LEFT ANKLE, AND APPLY WOUND VAC;  Surgeon: [name], MD;  Location: [place];  Service: Orthopedics;  Laterality: Bilateral;  . INCISION AND DRAINAGE OF WOUND Bilateral 09/01/2019   Procedure: IRRIGATION AND DEBRIDEMENT FOREARM WOUNDS;  Surgeon: Cindra Presume, MD;  Location: [place];  Service: Plastics;  Laterality: Bilateral;  . IRRIGATION AND DEBRIDEMENT ABSCESS     Hx: of left arm abscess related to drug use   . TEE WITHOUT CARDIOVERSION N/A 11/28/2017   Procedure: TRANSESOPHAGEAL ECHOCARDIOGRAM (TEE);  Surgeon: Sanda Klein, MD;  Location: [place];  Service: Cardiovascular;  Laterality: N/A;  . TEE WITHOUT CARDIOVERSION N/A 05/12/2019   Procedure: TRANSESOPHAGEAL ECHOCARDIOGRAM (TEE);  Surgeon: Skeet Latch, MD;  Location: [place];  Service: Cardiovascular;  Laterality: N/A;      Social History:  reports that he has been smoking cigarettes. He has a 25.00 pack-year smoking history. He has never used smokeless tobacco. He reports previous drug use. Drugs: Cocaine, IV, and Heroin. He reports that he does not drink alcohol. Family History:  Family History  Problem Relation Age of Onset  . Diabetes Father   . Heart disease Father   . Mental illness Sister   . Cancer Neg Hx       HOME MEDICATIONS: Allergies as of 06/20/2020   No Known Allergies     Medication List       Accurate as of June 20, 2020 12:54 PM. If you have any questions, ask your nurse or doctor.        Accu-Chek Guide Me w/Device Kit Use to check blood sugar TID. E11.65   Accu-Chek Guide w/Device Kit USE TO CHECK BLOOD SUGAR THREE TIMES DAILY   Accu-Chek Guide test strip Generic drug: glucose blood USE TO CHECK BLOOD SUGAR THREE TIMES DAILY E11.65   Accu-Chek Softclix Lancets lancets USE TO CHECK BLOOD SUGAR THREE TIMES DAILY E11.65   albuterol 108 (90 Base) MCG/ACT inhaler Commonly known as: VENTOLIN HFA Inhale 2  puffs into the lungs every 6 (six) hours as needed for wheezing or shortness of breath.   amLODipine 5 MG tablet Commonly known as: NORVASC Take 5 mg by mouth daily.   amoxicillin-clavulanate 875-125 MG tablet Commonly known as: AUGMENTIN Take 1 tablet by mouth every 12 (twelve) hours.   aspirin EC 81 MG tablet Take 1 tablet (81 mg total) by mouth daily. Swallow whole.   atorvastatin 40 MG tablet Commonly known as: LIPITOR Take 1 tablet (40 mg total) by mouth daily.   carvedilol 6.25 MG tablet Commonly known as: COREG Take 1 tablet (6.25 mg total) by mouth 2 (two) times daily with a meal.   DULoxetine 30 MG capsule Commonly known as: CYMBALTA  Take 1 capsule (30 mg total) by mouth daily.   ferrous sulfate 325 (65 FE) MG tablet Take 1 tablet (325 mg total) by mouth daily with breakfast.   furosemide 20 MG tablet Commonly known as: LASIX Take 1 tablet (20 mg total) by mouth daily for 14 days.   HumaLOG KwikPen 100 UNIT/ML KwikPen Generic drug: insulin lispro INJECT 4-20 UNITS INTO THE SKIN 3 (THREE) TIMES DAILY. PER SLIDING SCALE What changed:   how much to take  how to take this  when to take this  additional instructions   Kerlix Bandage Roll Misc Apply to affected area as prescribed   Lantus SoloStar 100 UNIT/ML Solostar Pen Generic drug: insulin glargine INJECT 40 UNITS INTO THE SKIN DAILY. MUST KEEP UPCOMING OFFICE VISIT FOR REFILLS   levothyroxine 50 MCG tablet Commonly known as: SYNTHROID Take 1 tablet (50 mcg total) by mouth daily.   lidocaine 2 % solution Commonly known as: XYLOCAINE Use as directed 15 mLs in the mouth or throat as needed for mouth pain.   lisinopril 2.5 MG tablet Commonly known as: ZESTRIL Take 1 tablet (2.5 mg total) by mouth daily.   Melatonin 5 MG Chew Chew 5 mg by mouth at bedtime.   omeprazole 20 MG capsule Commonly known as: PRILOSEC Take 1 capsule (20 mg total) by mouth daily.   sertraline 50 MG tablet Commonly  known as: ZOLOFT Take 1 tablet (50 mg total) by mouth daily.   SPS 15 GM/60ML suspension Generic drug: sodium polystyrene TAKE 120 MLS (30 G TOTAL) BY MOUTH ONCE FOR 1 DOSE.   SPS 15 GM/60ML suspension Generic drug: sodium polystyrene TAKE 60 MLS (15 G TOTAL) BY MOUTH ONCE FOR 1 DOSE.   traZODone 50 MG tablet Commonly known as: DESYREL Take 1 tablet (50 mg total) by mouth at bedtime.   TRUEplus 5-Bevel Pen Needles 32G X 4 MM Misc Generic drug: Insulin Pen Needle Use as instructed to inject insulin.   TRUEplus Pen Needles 31G X 5 MM Misc Generic drug: Insulin Pen Needle USE AS INSTRUCTED TO INJECT INSULIN.   Xeroform Petrolatum Roll 4"x9' Misc Apply topically to affected area as prescribed        ALLERGIES: No Known Allergies   REVIEW OF SYSTEMS: A comprehensive ROS was conducted with the patient and is negative except as per HPI and below:  ROS    OBJECTIVE:   VITAL SIGNS: There were no vitals taken for this visit.   PHYSICAL EXAM:  General: Pt appears well and is in NAD  Hydration: Well-hydrated with moist mucous membranes and good skin turgor  HEENT: Head: Unremarkable with good dentition. Oropharynx clear without exudate.  Eyes: External eye exam normal without stare, lid lag or exophthalmos.  EOM intact.  PERRL.  Neck: General: Supple without adenopathy or carotid bruits. Thyroid: Thyroid size normal.  No goiter or nodules appreciated. No thyroid bruit.  Lungs: Clear with good BS bilat with no rales, rhonchi, or wheezes  Heart: RRR with normal S1 and S2 and no gallops; no murmurs; no rub  Abdomen: Normoactive bowel sounds, soft, nontender, without masses or organomegaly palpable  Extremities:  Lower extremities - No pretibial edema. No lesions.  Skin: Normal texture and temperature to palpation. No rash noted. No Acanthosis nigricans/skin tags. No lipohypertrophy.  Neuro: MS is good with appropriate affect, pt is alert and Ox3    DM foot exam:    DATA  REVIEWED:  Lab Results  Component Value Date   HGBA1C 11.6 (H)  03/04/2020   HGBA1C 10.8 (H) 08/27/2019   HGBA1C 10.9 (H) 05/20/2019   Lab Results  Component Value Date   MICROALBUR 0.5 12/10/2013   LDLCALC 157 (H) 03/04/2020   CREATININE 1.50 (H) 03/04/2020   Lab Results  Component Value Date   MICRALBCREAT 798 (H) 03/04/2020    Lab Results  Component Value Date   CHOL 244 (H) 03/04/2020   HDL 60 03/04/2020   LDLCALC 157 (H) 03/04/2020   TRIG 149 03/04/2020   CHOLHDL 4.1 03/04/2020        ASSESSMENT / PLAN / RECOMMENDATIONS:   1) Type *** Diabetes Mellitus, ***controlled, With*** complications - Most recent A1c of *** %. Goal A1c < *** %.  ***  Plan: GENERAL:  ***  MEDICATIONS:  ***  EDUCATION / INSTRUCTIONS:  BG monitoring instructions: Patient is instructed to check his blood sugars *** times a day, ***.  Call [facility] if: BG persistently < 70 or > 300. . I reviewed the Rule of 15 for the treatment of hypoglycemia in detail with the patient. Literature supplied.   2) Diabetic complications:   Eye: Does *** have known diabetic retinopathy.   Neuro/ Feet: Does *** have known diabetic peripheral neuropathy.  Renal: Patient does *** have known baseline CKD. He is *** on an ACEI/ARB at present.Check urine albumin/creatinine ratio yearly starting at time of diagnosis. If albuminuria is positive, treatment is geared toward better glucose, blood pressure control and use of ACE inhibitors or ARBs. Monitor electrolytes and creatinine once to twice yearly.   3) Lipids: Patient is *** on a statin.    4) Hypertension: ***  at goal of < 140/90 mmHg.       Signed electronically by: Mack Guise, MD  [facility]  [facility] [address] [place], [place] [address] Phone: [phone] FAX: [phone]   CC: Gildardo Pounds, NP [place] [address] Phone: [phone]   Fax: [phone]    Return to Endocrinology clinic as below: Future Appointments  Date Time Provider [place]  06/20/2020  3:00 PM Ignazio Kincaid, Melanie Crazier, MD [facility] None  07/19/2020  3:45 PM Cleaver, Jossie Ng, NP [facility]

## 2020-06-23 ENCOUNTER — Other Ambulatory Visit: Payer: Self-pay | Admitting: Family Medicine

## 2020-06-23 ENCOUNTER — Other Ambulatory Visit: Payer: Self-pay

## 2020-06-23 MED ORDER — LANTUS SOLOSTAR 100 UNIT/ML ~~LOC~~ SOPN
40.0000 [IU] | PEN_INJECTOR | Freq: Two times a day (BID) | SUBCUTANEOUS | 2 refills | Status: DC
  Filled 2020-06-23: qty 30, 38d supply, fill #0
  Filled 2020-09-01: qty 30, 38d supply, fill #1

## 2020-06-23 NOTE — Telephone Encounter (Signed)
   Notes to clinic:  Review for change requested through pharmacy    Requested Prescriptions  Pending Prescriptions Disp Refills   insulin glargine (LANTUS SOLOSTAR) 100 UNIT/ML Solostar Pen 15 mL 0    Sig: INJECT 40 UNITS INTO THE SKIN DAILY. MUST KEEP UPCOMING OFFICE VISIT FOR REFILLS      Endocrinology:  Diabetes - Insulins Failed - 06/23/2020  2:26 PM      Failed - HBA1C is between 0 and 7.9 and within 180 days    Hemoglobin A1C  Date Value Ref Range Status  04/11/2014 11.3 (H) 4.2 - 6.3 % Final    Comment:    The American Diabetes Association recommends that a primary goal of therapy should be <7% and that physicians should reevaluate the treatment regimen in patients with HbA1c values consistently >8%.    HbA1c POC (<> result, manual entry)  Date Value Ref Range Status  03/24/2018 >15 4.0 - 5.6 % Final   Hgb A1c MFr Bld  Date Value Ref Range Status  03/04/2020 11.6 (H) 4.8 - 5.6 % Final    Comment:             Prediabetes: 5.7 - 6.4          Diabetes: >6.4          Glycemic control for adults with diabetes: <7.0           Passed - Valid encounter within last 6 months    Recent Outpatient Visits           1 week ago Insulin dependent type 2 diabetes mellitus [facility])   [place], [name], NP   9 months ago Hospital discharge follow-up   [place], NP   12 months ago Hypokalemia   [place], Dionne Bucy, [place]   1 year ago Hospital discharge follow-up   [place], Zelda W, NP   1 year ago Bacterial skin infection   [place], [name], NP       Future Appointments             In 3 weeks Cleaver, Jossie Ng, NP [facility], [facility]

## 2020-06-24 ENCOUNTER — Other Ambulatory Visit: Payer: Self-pay

## 2020-06-30 ENCOUNTER — Other Ambulatory Visit: Payer: Self-pay

## 2020-07-13 NOTE — Progress Notes (Signed)
Cardiology Clinic Note   Patient Name: Luke [name] Date of Encounter: 07/19/2020  Primary Care Provider:  Gildardo Pounds, NP Primary Cardiologist:  Evalina Field, MD  Patient Profile    Luke [name] 40 year old male presents the clinic today for follow-up evaluation of his mitral valve endocarditis and mitral valve regurgitation.  Past Medical History    Past Medical History:  Diagnosis Date  . Anemia   . Anxiety  Dx 2008  . Cellulitis   . Chronic kidney disease   . Diabetes mellitus without complication ([place]) Dx 6812  . DKA (diabetic ketoacidoses) 07/21/2017  . Endocarditis 04/2019  . Endocarditis   . GERD (gastroesophageal reflux disease) Dx 2008  . Headache(784.0)   . Heart murmur   . Hepatitis C   . Hypertension   . Hypothyroidism   . MRSA infection   . Pneumonia   . Seizures ([place]) 2011    x 2 in lifetime. on Dilantin for a while.   . Sepsis ([place])   . 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 Type 1 diabetes [facility])    Past Surgical History:  Procedure Laterality Date  . AMPUTATION Left 09/05/2018   Procedure: LEFT GREAT TOE AMPUTATION;  Surgeon: [name], MD;  Location: [place];  Service: Orthopedics;  Laterality: Left;  . APPLICATION OF A-CELL OF EXTREMITY Bilateral 09/01/2019   Procedure: APPLICATION OF MESHED PRIMATRIX AG OF EXTREMITY;  Surgeon: Cindra Presume, MD;  Location: [place];  Service: Plastics;  Laterality: Bilateral;  . I & D EXTREMITY Left 10/11/2012   Procedure: IRRIGATION AND DEBRIDEMENT ABSCESS FOREARM;  Surgeon: Linna Hoff, MD;  Location: [place];  Service: Orthopedics;  Laterality: Left;  . I & D EXTREMITY Left 10/12/2012   Procedure: IRRIGATION AND DEBRIDEMENT FOREARM;  Surgeon: Linna Hoff, MD;  Location: [place];  Service: Orthopedics;  Laterality: Left;  . I & D EXTREMITY Left 10/14/2012   Procedure: incision and drainage left forearm;  Surgeon: Linna Hoff, MD;  Location: [place];  Service: Orthopedics;   Laterality: Left;  . I & D EXTREMITY Left 10/16/2012   Procedure: IRRIGATION AND DEBRIDEMENT LEFT FOREARM;  Surgeon: Linna Hoff, MD;  Location: [place];  Service: Orthopedics;  Laterality: Left;  . I & D EXTREMITY Left 10/20/2012   Procedure: INCISION AND DRAINAGE AND DEBRIDEMENT LEFT  FOREARM;  Surgeon: Linna Hoff, MD;  Location: [place];  Service: Orthopedics;  Laterality: Left;  . I & D EXTREMITY Right 05/22/2019   Procedure: IRRIGATION AND DEBRIDEMENT RIGHT ARM;  Surgeon: [name], MD;  Location: [place];  Service: Orthopedics;  Laterality: Right;  . INCISION AND DRAINAGE OF WOUND Bilateral 11/29/2017   Procedure: DEBRIDEMENT BILATERAL FEET, DEBRIDEMENT LEFT ANKLE, AND APPLY WOUND VAC;  Surgeon: [name], MD;  Location: [place];  Service: Orthopedics;  Laterality: Bilateral;  . INCISION AND DRAINAGE OF WOUND Bilateral 09/01/2019   Procedure: IRRIGATION AND DEBRIDEMENT FOREARM WOUNDS;  Surgeon: Cindra Presume, MD;  Location: [place];  Service: Plastics;  Laterality: Bilateral;  . IRRIGATION AND DEBRIDEMENT ABSCESS     Hx: of left arm abscess related to drug use   . TEE WITHOUT CARDIOVERSION N/A 11/28/2017   Procedure: TRANSESOPHAGEAL ECHOCARDIOGRAM (TEE);  Surgeon: Sanda Klein, MD;  Location: [place];  Service: Cardiovascular;  Laterality: N/A;  . 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  He developed MV endocarditis in February 2021 in the setting of IVDA.  He was deemed not to be a candidate for surgery due to his IVDA.  He was readmitted 6/21 and his MR at that time was severe.  He was seen by Dr. Audie Box 10/30/2019.  During that time he reported he was doing well after being  discharged from the hospital.  His echocardiogram was reviewed.  He was noted to have a large systolic murmur that radiated into his axilla consistent with anterior mitral valve leaflet pathology.  He was tachycardic on exam with a heart rate of 109.  He denied further fever and chills.  He reported he had been off heroin and cocaine.  He reported he had not used any other drugs since being seen in the hospital.  His diabetes was severe and uncontrolled with an A1c of 10.8.  His lipid panel at that time showed an LDL cholesterol of 156.  He reported some swelling in his lower extremities.  He reported that he had previously taken furosemide but had been taken off the medication.  He reported that he was able to walk as far as he wanted however, did have some shortness of breath with increased physical activity.  His carvedilol was continued.  He was started on lisinopril 2.5 mg daily.  He was asked to return to clinic in 6 weeks but was lost to follow-up.  He presents to the clinic today for follow-up evaluation states he feels well.  He reports that he has been abstaining from IV drug use for approximately 1 year.  He reports that he does have some increased shortness of breath with increased physical activity.  We discussed his medications and he reports that he has not been taking most of his medications for around 6 months.  We reviewed his echocardiogram and the importance of medication compliance.  He expressed understanding.  I will refill his carvedilol, amlodipine, give him the salty 6 diet sheet, provide him with a blood pressure log, and have him follow-up in 1 month.  Today he denies chest pain, increased shortness of breath, lower extremity edema, fatigue, palpitations, melena, hematuria, hemoptysis, diaphoresis, weakness, presyncope, syncope, orthopnea, and PND.   Home Medications    Prior to Admission medications   Medication Sig Start Date End Date Taking? Authorizing Provider  Accu-Chek  Softclix Lancets lancets USE TO CHECK BLOOD SUGAR THREE TIMES DAILY E11.65 01/14/20 01/13/21  Charlott Rakes, MD  albuterol (VENTOLIN HFA) 108 (90 Base) MCG/ACT inhaler Inhale 2 puffs into the lungs every 6 (six) hours as needed for wheezing or shortness of breath. 09/18/19   Gildardo Pounds, NP  amLODipine (NORVASC) 5 MG tablet Take 5 mg by mouth daily.    [provider]  amoxicillin-clavulanate (AUGMENTIN) 875-125 MG tablet Take 1 tablet by mouth every 12 (twelve) hours. 04/10/20   Hazel Sams, PA-C  aspirin EC 81 MG tablet Take 1 tablet (81 mg total) by mouth daily. Swallow whole. 10/30/19   O'NealCassie Freer, MD  atorvastatin (LIPITOR) 40 MG tablet Take 1 tablet (40 mg total) by mouth daily. 06/12/20 09/10/20  Gildardo Pounds, NP  Bismuth Tribromoph-Petrolatum (XEROFORM PETROLATUM ROLL 4"X9') MISC Apply topically to affected area as prescribed 09/18/19   Raul Del,  [name], NP  Blood Glucose Monitoring Suppl (ACCU-CHEK GUIDE ME) w/Device KIT Use to check blood sugar TID. E11.65 01/14/20   Charlott Rakes, MD  Blood Glucose Monitoring Suppl (ACCU-CHEK GUIDE) w/Device KIT USE TO CHECK BLOOD SUGAR THREE TIMES DAILY 01/14/20 01/13/21  Charlott Rakes, MD  carvedilol (COREG) 6.25 MG tablet Take 1 tablet (6.25 mg total) by mouth 2 (two) times daily with a meal. 09/18/19 12/17/19  Gildardo Pounds, NP  DULoxetine (CYMBALTA) 30 MG capsule Take 1 capsule (30 mg total) by mouth daily. 09/18/19 12/17/19  Gildardo Pounds, NP  ferrous sulfate 325 (65 FE) MG tablet Take 1 tablet (325 mg total) by mouth daily with breakfast. 09/18/19   Gildardo Pounds, NP  furosemide (LASIX) 20 MG tablet Take 1 tablet (20 mg total) by mouth daily for 14 days. 10/30/19 11/18/19  O'NealCassie Freer, MD  Gauze Pads & Dressings ([place]) MISC Apply to affected area as prescribed 09/18/19   Gildardo Pounds, NP  glucose blood test strip USE TO CHECK BLOOD SUGAR THREE TIMES DAILY E11.65 01/14/20 01/13/21  Charlott Rakes, MD  insulin glargine (LANTUS SOLOSTAR) 100 UNIT/ML Solostar Pen Inject 40 Units into the skin 2 (two) times daily. 06/23/20   Charlott Rakes, MD  insulin lispro (HUMALOG) 100 UNIT/ML KwikPen INJECT 4-20 UNITS INTO THE SKIN 3 (THREE) TIMES DAILY. PER SLIDING SCALE Patient taking differently: Inject 5 Units into the skin See admin instructions. Take 5 units at meal time and sliding scale through out the day Above 200  Take 2 units up to 400 units take 14 units 09/18/19 09/17/20  Gildardo Pounds, NP  Insulin Pen Needle (TRUEPLUS 5-BEVEL PEN NEEDLES) 32G X 4 MM MISC Use as instructed to inject insulin. 11/27/19   Charlott Rakes, MD  Insulin Pen Needle 31G X 5 MM MISC USE AS INSTRUCTED TO INJECT INSULIN. 11/27/19 11/26/20  Charlott Rakes, MD  levothyroxine (SYNTHROID) 50 MCG tablet Take 1 tablet (50 mcg total) by mouth daily. 09/18/19   Gildardo Pounds, NP  lidocaine (XYLOCAINE) 2 % solution Use as directed 15 mLs in the mouth or throat as needed for mouth pain. 04/10/20   Hazel Sams, PA-C  lisinopril (ZESTRIL) 2.5 MG tablet Take 1 tablet (2.5 mg total) by mouth daily. 10/30/19 01/28/20  Geralynn Rile, MD  Melatonin 5 MG CHEW Chew 5 mg by mouth at bedtime.    [provider]  omeprazole (PRILOSEC) 20 MG capsule Take 1 capsule (20 mg total) by mouth daily. 09/18/19 12/17/19  Gildardo Pounds, NP  sertraline (ZOLOFT) 50 MG tablet Take 1 tablet (50 mg total) by mouth daily. 09/18/19   Gildardo Pounds, NP  sodium polystyrene (KAYEXALATE) 15 GM/60ML suspension TAKE 60 MLS (15 G TOTAL) BY MOUTH ONCE FOR 1 DOSE. 03/29/20 03/29/21  Charlott Rakes, MD  sodium polystyrene (KAYEXALATE) 15 GM/60ML suspension TAKE 120 MLS (30 G TOTAL) BY MOUTH ONCE FOR 1 DOSE. 03/22/20 03/22/21  Gildardo Pounds, NP  traZODone (DESYREL) 50 MG tablet Take 1 tablet (50 mg total) by mouth at bedtime. 09/18/19   Gildardo Pounds, NP    Family History    Family History  Problem Relation Age of Onset  . Diabetes Father    . Heart disease Father   . Mental illness Sister   . Cancer Neg Hx    He indicated that his mother is alive. He indicated that his father is deceased. He indicated that his sister is alive.  He indicated that his brother is alive. He indicated that the status of his neg hx is unknown.  Social History    Social History   Socioeconomic History  . Marital status: Married    Spouse name: Not on file  . Number of children: 6  . Years of education: some colle  . Highest education level: Not on file  Occupational History  . Occupation: Event set up    Employer: [employer]   . Occupation: Stencil for cars   Tobacco Use  . Smoking status: Current Every Day Smoker    Packs/day: 1.00    Years: 25.00    Pack years: 25.00    Types: Cigarettes  . Smokeless tobacco: Never Used  . Tobacco comment: decreased over the last few weeks because he has not been breathing well  Vaping Use  . Vaping Use: Some days  Substance and Sexual Activity  . Alcohol use: No    Alcohol/week: 0.0 standard drinks    Comment: rarely  . Drug use: Not Currently    Types: Cocaine, IV, Heroin    Comment: IVDU since Dad died in 2006-05-18 with 15 month period of sobriety 2018-2019, relapse Aug 2019.  Mar[place] Kitchen Sexual activity: Yes    Partners: Female    Birth control/protection: None  Other Topics Concern  . Not on file  Social History Narrative   Lives with wife and two children age 68 and 18.          Social Determinants of Health   Financial Resource Strain: Not on file  Food Insecurity: Not on file  Transportation Needs: No Transportation Needs  . Lack of Transportation (Medical): No  . Lack of Transportation (Non-Medical): No  Physical Activity: Not on file  Stress: Not on file  Social Connections: Moderately Isolated  . Frequency of Communication with Friends and Family: More than three times a week  . Frequency of Social Gatherings with Friends and Family: More than three times a  week  . Attends Religious Services: Never  . Active Member of Clubs or Organizations: No  . Attends Archivist Meetings: Not on file  . Marital Status: Married  Human resources officer Violence: Not on file     Review of Systems    General:  No chills, fever, night sweats or weight changes.  Cardiovascular:  No chest pain, dyspnea on exertion, edema, orthopnea, palpitations, paroxysmal nocturnal dyspnea. Dermatological: No rash, lesions/masses Respiratory: No cough, dyspnea Urologic: No hematuria, dysuria Abdominal:   No nausea, vomiting, diarrhea, bright red blood per rectum, melena, or hematemesis Neurologic:  No visual changes, wkns, changes in mental status. All other systems reviewed and are otherwise negative except as noted above.  Physical Exam    VS:  BP (!) 160/111 (BP Location: Right Arm, Patient Position: Sitting, Cuff Size: Normal) Comment (Cuff Size): automatic machine  Pulse (!) 125   Ht _0  (1.753 m)   Wt 180 lb 3.2 oz (81.7 kg)   SpO2 98%   BMI 26.61 kg/m  , BMI Body mass index is 26.61 kg/m. GEN: Well nourished, well developed, in no acute distress. HEENT: normal. Neck: Supple, no JVD, carotid bruits, or masses. Cardiac: RRR, no murmurs, rubs, or gallops. No clubbing, cyanosis, edema.  Radials/DP/PT 2+ and equal bilaterally.  Respiratory:  Respirations regular and unlabored, clear to auscultation bilaterally. GI: Soft, nontender, nondistended, BS + x 4. MS: no deformity or atrophy. Skin: warm and dry, no rash. Neuro:  Strength and sensation are intact. Psych: Normal affect.  Accessory Clinical Findings    Recent Labs: 08/21/2019: Magnesium 1.7 08/27/2019: B Natriuretic Peptide 257.6 03/04/2020: ALT 29; BUN 18; Creatinine, Ser 1.50; Hemoglobin 12.6; Platelets 440; Potassium 6.4; Sodium 141; TSH 1.400   Recent Lipid Panel    Component Value Date/Time   CHOL 244 (H) 03/04/2020 1544   CHOL 150 04/11/2014 0054   TRIG 149 03/04/2020 1544   TRIG 121  04/11/2014 0054   HDL 60 03/04/2020 1544   HDL 46 04/11/2014 0054   CHOLHDL 4.1 03/04/2020 1544   CHOLHDL 6.6 10/21/2017 1508   VLDL 45 (H) 10/21/2017 1508   VLDL 24 04/11/2014 0054   LDLCALC 157 (H) 03/04/2020 1544   LDLCALC 80 04/11/2014 0054    ECG personally reviewed by me today-sinus tachycardia possible left atrial enlargement left ventricular hypertrophy 125 bpm  Echocardiogram 08/28/2019 1. Mitral and pulmonic valve vegetations. Endocarditis.  2. Left ventricular ejection fraction, by estimation, is 70 to 75%. The  left ventricle has hyperdynamic function. The left ventricle has no  regional wall motion abnormalities. Left ventricular diastolic parameters  were normal.  3. Right ventricular systolic function is normal. The right ventricular  size is normal.  4. There is a anterior mitral leaflet valvular vegetation (1.1 x 0.6 cm).  The mitral valve is abnormal. Moderate mitral valve regurgitation. No  evidence of mitral stenosis.  5. The aortic valve is normal in structure. Aortic valve regurgitation is  mild. No aortic stenosis is present.  6. Possible vegetation on pulmonic valve. The pulmonic valve was  abnormal.  7. The inferior vena cava is normal in size with greater than 50%  respiratory variability, suggesting right atrial pressure of 3 mmHg.   Assessment & Plan   1.  Mitral valve endocarditis/severe mitral regurgitation- continues with  murmur that radiates to his axillary region.  Previously suspected disruption of the anterior mitral valve leaflet resulting in eccentric jet in the posterior direction.  Not a candidate for surgery due to IVDA.  Reports cessation of IV drug use.  UDS 08/27/2019 positive for opiates and cocaine. Restart carvedilol Heart healthy low-sodium diet-salty 6 given Increase physical activity as tolerated  Essential hypertension-BP today 160/110.  Currently not taking blood pressure medication.  We discussed the importance of  medication compliance.  I will refill his carvedilol and his Norvasc. Continue amlodipine, carvedilol Heart healthy low-sodium diet-salty 6 given Increase physical activity as tolerated Blood pressure log  Hypercholesterolemia-LDL 157 on 03/04/2020. Continue aspirin, atorvastatin Heart healthy low-sodium high-fiber diet.   Increase physical activity as tolerated Follows with PCP  Disposition: Follow-up with Dr. Audie Box or me in 1 months   Jossie Ng. Treon Kehl NP-C    07/19/2020, 4:28 PM [address] Office [phone] Fax [phone]  Notice: This dictation was prepared with Dragon dictation along with smaller phrase technology. Any transcriptional errors that result from this process are unintentional and may not be corrected upon review.  I spent 12 minutes examining this patient, reviewing medications, and using patient centered shared decision making involving her cardiac care.  Prior to her visit I spent greater than 20 minutes reviewing her past medical history,  medications, and prior cardiac tests.

## 2020-07-19 ENCOUNTER — Other Ambulatory Visit: Payer: Self-pay

## 2020-07-19 ENCOUNTER — Encounter: Payer: Self-pay | Admitting: General Practice

## 2020-07-19 ENCOUNTER — Ambulatory Visit (INDEPENDENT_AMBULATORY_CARE_PROVIDER_SITE_OTHER): Payer: Medicaid Other | Admitting: General Practice

## 2020-07-19 VITALS — BP 160/111 | HR 125 | Ht 69.0 in | Wt 180.2 lb

## 2020-07-19 DIAGNOSIS — I340 Nonrheumatic mitral (valve) insufficiency: Secondary | ICD-10-CM

## 2020-07-19 DIAGNOSIS — Z8679 Personal history of other diseases of the circulatory system: Secondary | ICD-10-CM

## 2020-07-19 DIAGNOSIS — I10 Essential (primary) hypertension: Secondary | ICD-10-CM | POA: Diagnosis not present

## 2020-07-19 DIAGNOSIS — E7800 Pure hypercholesterolemia, unspecified: Secondary | ICD-10-CM

## 2020-07-19 DIAGNOSIS — I059 Rheumatic mitral valve disease, unspecified: Secondary | ICD-10-CM | POA: Diagnosis not present

## 2020-07-19 MED ORDER — AMLODIPINE BESYLATE 5 MG PO TABS: 5.0000 mg | ORAL_TABLET | Freq: Every day | ORAL | 3 refills | Status: DC

## 2020-07-19 MED ORDER — CARVEDILOL 6.25 MG PO TABS: 6.2500 mg | ORAL_TABLET | Freq: Two times a day (BID) | ORAL | 3 refills | Status: DC

## 2020-07-19 NOTE — Progress Notes (Signed)
Almyra Free:  Let's get him into see me. Inform him that we will plan for a UDS at our visit. He needs his valve fixed.   [place] T. Audie Box, MD, [place]  [address] [place], [place] [address] [phone]  6:50 PM

## 2020-07-19 NOTE — Patient Instructions (Signed)
Medication Instructions:  The current medical regimen is effective;  continue present plan and medications as directed. Please refer to the Current Medication list given to you today.  *If you need a refill on your cardiac medications before your next appointment, please call your pharmacy*  Lab Work:   Testing/Procedures:  NONE    NONE  Special Instructions PLEASE READ AND FOLLOW SALTY 6-ATTACHED-1,'800mg'$  daily  PLEASE INCREASE PHYSICAL ACTIVITY AS TOLERATED  TAKE AND LOG YOUR BLOOD PRESSURE AT LEASE 1 HOUR AFTER TAKING YOUR MEDICATION. BRING BACK WITH YOU TO YOU FOLLOW UP APPOINTMENT  Follow-Up: Your next appointment:  1 month(s) In Person with Eleonore Chiquito, MD OR IF UNAVAILABLE [place], FNP-C   Please call our office 2 months in advance to schedule this appointment   At [facility], you and your health needs are our priority.  As part of our continuing mission to provide you with exceptional heart care, we have created designated Provider Care Teams.  These Care Teams include your primary Cardiologist (physician) and Advanced Practice Providers (APPs -  Physician Assistants and Nurse Practitioners) who all work together to provide you with the care you need, when you need it.            6 SALTY THINGS TO AVOID     1,'800MG'$  DAILY

## 2020-07-20 ENCOUNTER — Emergency Department (HOSPITAL_COMMUNITY)
Admission: EM | Admit: 2020-07-20 | Discharge: 2020-07-20 | Disposition: A | Discharge status: Home / Self Care | Payer: Medicaid Other | Attending: Emergency Medicine | Admitting: Emergency Medicine

## 2020-07-20 DIAGNOSIS — Z5321 Procedure and treatment not carried out due to patient leaving prior to being seen by health care provider: Secondary | ICD-10-CM | POA: Diagnosis not present

## 2020-07-20 DIAGNOSIS — M545 Low back pain, unspecified: Secondary | ICD-10-CM | POA: Insufficient documentation
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
## 2020-07-22 ENCOUNTER — Telehealth: Payer: Self-pay

## 2020-07-22 NOTE — Progress Notes (Signed)
I called patient to get him scheduled, LVM to call back.  See telephone note.

## 2020-07-22 NOTE — Telephone Encounter (Signed)
Called patient, LVM advising him to call back to be scheduled with Dr.O'Neal to be evaluated.  Left call back number.

## 2020-07-22 NOTE — Telephone Encounter (Signed)
-----   Message from Geralynn Rile, MD sent at 07/19/2020  6:49 PM EDT -----   ----- Message ----- From: Deberah Pelton, NP Sent: 07/19/2020   4:30 PM EDT To: Geralynn Rile, MD

## 2020-08-22 NOTE — Progress Notes (Deleted)
Cardiology Clinic Note   Patient Name: Luke [name] Date of Encounter: 08/22/2020  Primary Care Provider:  Gildardo Pounds, NP Primary Cardiologist:  Evalina Field, MD  Patient Profile    Luke [name] 40 year old male presents the clinic today for follow-up evaluation of his mitral valve endocarditis and mitral valve regurgitation.  Past Medical History    Past Medical History:  Diagnosis Date  . Anemia   . Anxiety  Dx 2008  . Cellulitis   . Chronic kidney disease   . Diabetes mellitus without complication ([place]) Dx 5409  . DKA (diabetic ketoacidoses) 07/21/2017  . Endocarditis 04/2019  . Endocarditis   . GERD (gastroesophageal reflux disease) Dx 2008  . Headache(784.0)   . Heart murmur   . Hepatitis C   . Hypertension   . Hypothyroidism   . MRSA infection   . Pneumonia   . Seizures ([place]) 2011    x 2 in lifetime. on Dilantin for a while.   . Sepsis ([place])   . Substance abuse ([place]) 2013    heroin use, multiple relapses  . Type 1 diabetes [facility])    Past Surgical History:  Procedure Laterality Date  . AMPUTATION Left 09/05/2018   Procedure: LEFT GREAT TOE AMPUTATION;  Surgeon: [name], MD;  Location: [place];  Service: Orthopedics;  Laterality: Left;  . APPLICATION OF A-CELL OF EXTREMITY Bilateral 09/01/2019   Procedure: APPLICATION OF MESHED PRIMATRIX AG OF EXTREMITY;  Surgeon: Cindra Presume, MD;  Location: [place];  Service: Plastics;  Laterality: Bilateral;  . I & D EXTREMITY Left 10/11/2012   Procedure: IRRIGATION AND DEBRIDEMENT ABSCESS FOREARM;  Surgeon: Linna Hoff, MD;  Location: [place];  Service: Orthopedics;  Laterality: Left;  . I & D EXTREMITY Left 10/12/2012   Procedure: IRRIGATION AND DEBRIDEMENT FOREARM;  Surgeon: Linna Hoff, MD;  Location: [place];  Service: Orthopedics;  Laterality: Left;  . I & D EXTREMITY Left 10/14/2012   Procedure: incision and drainage left forearm;  Surgeon: Linna Hoff, MD;  Location: [place];  Service: Orthopedics;   Laterality: Left;  . I & D EXTREMITY Left 10/16/2012   Procedure: IRRIGATION AND DEBRIDEMENT LEFT FOREARM;  Surgeon: Linna Hoff, MD;  Location: [place];  Service: Orthopedics;  Laterality: Left;  . I & D EXTREMITY Left 10/20/2012   Procedure: INCISION AND DRAINAGE AND DEBRIDEMENT LEFT  FOREARM;  Surgeon: Linna Hoff, MD;  Location: [place];  Service: Orthopedics;  Laterality: Left;  . I & D EXTREMITY Right 05/22/2019   Procedure: IRRIGATION AND DEBRIDEMENT RIGHT ARM;  Surgeon: [name], MD;  Location: [place];  Service: Orthopedics;  Laterality: Right;  . INCISION AND DRAINAGE OF WOUND Bilateral 11/29/2017   Procedure: DEBRIDEMENT BILATERAL FEET, DEBRIDEMENT LEFT ANKLE, AND APPLY WOUND VAC;  Surgeon: [name], MD;  Location: [place];  Service: Orthopedics;  Laterality: Bilateral;  . INCISION AND DRAINAGE OF WOUND Bilateral 09/01/2019   Procedure: IRRIGATION AND DEBRIDEMENT FOREARM WOUNDS;  Surgeon: Cindra Presume, MD;  Location: [place];  Service: Plastics;  Laterality: Bilateral;  . IRRIGATION AND DEBRIDEMENT ABSCESS     Hx: of left arm abscess related to drug use   . TEE WITHOUT CARDIOVERSION N/A 11/28/2017   Procedure: TRANSESOPHAGEAL ECHOCARDIOGRAM (TEE);  Surgeon: Sanda Klein, MD;  Location: [place];  Service: Cardiovascular;  Laterality: N/A;  . 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  He developed MV endocarditis in February 2021 in the setting of IVDA.  He was deemed not to be a candidate for surgery due to his IVDA.  He was readmitted 6/21 and his MR at that time was severe.  He was seen by Dr. Audie Box 10/30/2019.  During that time he reported he was doing well after being  discharged from the hospital.  His echocardiogram was reviewed.  He was noted to have a large systolic murmur that radiated into his axilla consistent with anterior mitral valve leaflet pathology.  He was tachycardic on exam with a heart rate of 109.  He denied further fever and chills.  He reported he had been off heroin and cocaine.  He reported he had not used any other drugs since being seen in the hospital.  His diabetes was severe and uncontrolled with an A1c of 10.8.  His lipid panel at that time showed an LDL cholesterol of 156.  He reported some swelling in his lower extremities.  He reported that he had previously taken furosemide but had been taken off the medication.  He reported that he was able to walk as far as he wanted however, did have some shortness of breath with increased physical activity.  His carvedilol was continued.  He was started on lisinopril 2.5 mg daily.  He was asked to return to clinic in 6 weeks but was lost to follow-up.  He presented  to the clinic 07/19/2020 for follow-up evaluation stated he felt well.  He reported that he had been abstaining from IV drug use for approximately 1 year.  He reported that he did have some increased shortness of breath with increased physical activity.  We discussed his medications and he reported that he had not been taking most of his medications for around 6 months.  We reviewed his echocardiogram and the importance of medication compliance.  He expressed understanding.  I refilled his carvedilol, amlodipine, give him the salty 6 diet sheet, provided him with a blood pressure log, and planned follow-up in 1 month.  He presented to the emergency department on 06/21/2020.  His blood pressure was 143/89 with a pulse of 99.  He left before being seen.  He presents the clinic today for follow-up evaluation states***  Today he denies chest pain, increased shortness of breath, lower extremity edema, fatigue, palpitations, melena, hematuria,  hemoptysis, diaphoresis, weakness, presyncope, syncope, orthopnea, and PND.  Home Medications    Prior to Admission medications   Medication Sig Start Date End Date Taking? Authorizing Provider  Accu-Chek Softclix Lancets lancets USE TO CHECK BLOOD SUGAR THREE TIMES DAILY E11.65 Patient not taking: Reported on 07/19/2020 01/14/20 01/13/21  Charlott Rakes, MD  albuterol (VENTOLIN HFA) 108 (90 Base) MCG/ACT inhaler Inhale 2 puffs into the lungs every 6 (six) hours as needed for wheezing or shortness of breath. Patient not taking: Reported on 07/19/2020 09/18/19   Gildardo Pounds, NP  amLODipine (NORVASC) 5 MG tablet Take 1 tablet (5 mg total) by mouth daily. 07/19/20   Deberah Pelton, NP  amoxicillin-clavulanate (AUGMENTIN) 875-125 MG tablet Take 1 tablet by mouth every 12 (twelve) hours. Patient not taking: Reported on 07/19/2020 04/10/20   Hazel Sams, PA-C  aspirin EC 81 MG tablet Take 1 tablet (  81 mg total) by mouth daily. Swallow whole. Patient not taking: Reported on 07/19/2020 10/30/19   Geralynn Rile, MD  atorvastatin (LIPITOR) 40 MG tablet Take 1 tablet (40 mg total) by mouth daily. Patient not taking: Reported on 07/19/2020 06/12/20 09/10/20  Gildardo Pounds, NP  Bismuth Tribromoph-Petrolatum (XEROFORM PETROLATUM ROLL 5"F2') MISC Apply topically to affected area as prescribed Patient not taking: Reported on 07/19/2020 09/18/19   Gildardo Pounds, NP  Blood Glucose Monitoring Suppl (ACCU-CHEK GUIDE ME) w/Device KIT Use to check blood sugar TID. E11.65 Patient not taking: Reported on 07/19/2020 01/14/20   Charlott Rakes, MD  Blood Glucose Monitoring Suppl (ACCU-CHEK GUIDE) w/Device KIT USE TO CHECK BLOOD SUGAR THREE TIMES DAILY Patient not taking: Reported on 07/19/2020 01/14/20 01/13/21  Charlott Rakes, MD  carvedilol (COREG) 6.25 MG tablet Take 1 tablet (6.25 mg total) by mouth 2 (two) times daily with a meal. 07/19/20 10/17/20  Deberah Pelton, NP  DULoxetine (CYMBALTA) 30 MG capsule Take 1  capsule (30 mg total) by mouth daily. 09/18/19 12/17/19  Gildardo Pounds, NP  ferrous sulfate 325 (65 FE) MG tablet Take 1 tablet (325 mg total) by mouth daily with breakfast. Patient not taking: Reported on 07/19/2020 09/18/19   Gildardo Pounds, NP  furosemide (LASIX) 20 MG tablet Take 1 tablet (20 mg total) by mouth daily for 14 days. 10/30/19 11/18/19  O'NealCassie Freer, MD  Gauze Pads & Dressings ([place]) MISC Apply to affected area as prescribed Patient not taking: Reported on 07/19/2020 09/18/19   Gildardo Pounds, NP  glucose blood test strip USE TO CHECK BLOOD SUGAR THREE TIMES DAILY E11.65 Patient not taking: Reported on 07/19/2020 01/14/20 01/13/21  Charlott Rakes, MD  insulin glargine (LANTUS SOLOSTAR) 100 UNIT/ML Solostar Pen Inject 40 Units into the skin 2 (two) times daily. 06/23/20   Charlott Rakes, MD  insulin lispro (HUMALOG) 100 UNIT/ML KwikPen INJECT 4-20 UNITS INTO THE SKIN 3 (THREE) TIMES DAILY. PER SLIDING SCALE 09/18/19 09/17/20  Gildardo Pounds, NP  Insulin Pen Needle (TRUEPLUS 5-BEVEL PEN NEEDLES) 32G X 4 MM MISC Use as instructed to inject insulin. Patient not taking: Reported on 07/19/2020 11/27/19   Charlott Rakes, MD  Insulin Pen Needle 31G X 5 MM MISC USE AS INSTRUCTED TO INJECT INSULIN. Patient not taking: Reported on 07/19/2020 11/27/19 11/26/20  Charlott Rakes, MD  levothyroxine (SYNTHROID) 50 MCG tablet Take 1 tablet (50 mcg total) by mouth daily. Patient not taking: Reported on 07/19/2020 09/18/19   Gildardo Pounds, NP  lidocaine (XYLOCAINE) 2 % solution Use as directed 15 mLs in the mouth or throat as needed for mouth pain. Patient not taking: Reported on 07/19/2020 04/10/20   Hazel Sams, PA-C  lisinopril (ZESTRIL) 2.5 MG tablet Take 1 tablet (2.5 mg total) by mouth daily. 10/30/19 01/28/20  Geralynn Rile, MD  Melatonin 5 MG CHEW Chew 5 mg by mouth at bedtime. Patient not taking: Reported on 07/19/2020    [provider]  omeprazole (PRILOSEC) 20 MG  capsule Take 1 capsule (20 mg total) by mouth daily. 09/18/19 12/17/19  Gildardo Pounds, NP  sertraline (ZOLOFT) 50 MG tablet Take 1 tablet (50 mg total) by mouth daily. Patient not taking: Reported on 07/19/2020 09/18/19   Gildardo Pounds, NP  sodium polystyrene (KAYEXALATE) 15 GM/60ML suspension TAKE 60 MLS (15 G TOTAL) BY MOUTH ONCE FOR 1 DOSE. Patient not taking: Reported on 07/19/2020 03/29/20 03/29/21  Charlott Rakes, MD  sodium polystyrene (  KAYEXALATE) 15 GM/60ML suspension TAKE 120 MLS (30 G TOTAL) BY MOUTH ONCE FOR 1 DOSE. Patient not taking: Reported on 07/19/2020 03/22/20 03/22/21  Gildardo Pounds, NP  SUBOXONE 8-2 MG FILM SMARTSIG:1 Strip(s) By Mouth 3 Times Daily 07/14/20   [provider]  traZODone (DESYREL) 50 MG tablet Take 1 tablet (50 mg total) by mouth at bedtime. Patient not taking: Reported on 07/19/2020 09/18/19   Gildardo Pounds, NP    Family History    Family History  Problem Relation Age of Onset  . Diabetes Father   . Heart disease Father   . Mental illness Sister   . Cancer Neg Hx    He indicated that his mother is alive. He indicated that his father is deceased. He indicated that his sister is alive. He indicated that his brother is alive. He indicated that the status of his neg hx is unknown.  Social History    Social History   Socioeconomic History  . Marital status: Married    Spouse name: Not on file  . Number of children: 6  . Years of education: some colle  . Highest education level: Not on file  Occupational History  . Occupation: Event set up    Employer: [employer]   . Occupation: Stencil for cars   Tobacco Use  . Smoking status: Current Every Day Smoker    Packs/day: 1.00    Years: 25.00    Pack years: 25.00    Types: Cigarettes  . Smokeless tobacco: Never Used  . Tobacco comment: decreased over the last few weeks because he has not been breathing well  Vaping Use  . Vaping Use: Some days  Substance and Sexual  Activity  . Alcohol use: No    Alcohol/week: 0.0 standard drinks    Comment: rarely  . Drug use: Not Currently    Types: Cocaine, IV, Heroin    Comment: IVDU since Dad died in 2006-05-31 with 15 month period of sobriety 2018-2019, relapse Aug 2019.  Marland Kitchen Sexual activity: Yes    Partners: Female    Birth control/protection: None  Other Topics Concern  . Not on file  Social History Narrative   Lives with wife and two children age 80 and 82.          Social Determinants of Health   Financial Resource Strain: Not on file  Food Insecurity: Not on file  Transportation Needs: No Transportation Needs  . Lack of Transportation (Medical): No  . Lack of Transportation (Non-Medical): No  Physical Activity: Not on file  Stress: Not on file  Social Connections: Moderately Isolated  . Frequency of Communication with Friends and Family: More than three times a week  . Frequency of Social Gatherings with Friends and Family: More than three times a week  . Attends Religious Services: Never  . Active Member of Clubs or Organizations: No  . Attends Archivist Meetings: Not on file  . Marital Status: Married  Human resources officer Violence: Not on file     Review of Systems    General:  No chills, fever, night sweats or weight changes.  Cardiovascular:  No chest pain, dyspnea on exertion, edema, orthopnea, palpitations, paroxysmal nocturnal dyspnea. Dermatological: No rash, lesions/masses Respiratory: No cough, dyspnea Urologic: No hematuria, dysuria Abdominal:   No nausea, vomiting, diarrhea, bright red blood per rectum, melena, or hematemesis Neurologic:  No visual changes, wkns, changes in mental status. All other systems reviewed and are  otherwise negative except as noted above.  Physical Exam    VS:  There were no vitals taken for this visit. , BMI There is no height or weight on file to calculate BMI. GEN: Well nourished, well developed, in no acute distress. HEENT: normal. Neck:  Supple, no JVD, carotid bruits, or masses. Cardiac: RRR, no murmurs, rubs, or gallops. No clubbing, cyanosis, edema.  Radials/DP/PT 2+ and equal bilaterally.  Respiratory:  Respirations regular and unlabored, clear to auscultation bilaterally. GI: Soft, nontender, nondistended, BS + x 4. MS: no deformity or atrophy. Skin: warm and dry, no rash. Neuro:  Strength and sensation are intact. Psych: Normal affect.  Accessory Clinical Findings    Recent Labs: 08/27/2019: B Natriuretic Peptide 257.6 03/04/2020: ALT 29; BUN 18; Creatinine, Ser 1.50; Hemoglobin 12.6; Platelets 440; Potassium 6.4; Sodium 141; TSH 1.400   Recent Lipid Panel    Component Value Date/Time   CHOL 244 (H) 03/04/2020 1544   CHOL 150 04/11/2014 0054   TRIG 149 03/04/2020 1544   TRIG 121 04/11/2014 0054   HDL 60 03/04/2020 1544   HDL 46 04/11/2014 0054   CHOLHDL 4.1 03/04/2020 1544   CHOLHDL 6.6 10/21/2017 1508   VLDL 45 (H) 10/21/2017 1508   VLDL 24 04/11/2014 0054   LDLCALC 157 (H) 03/04/2020 1544   LDLCALC 80 04/11/2014 0054    ECG personally reviewed by me today- *** - No acute changes  EKG 07/19/2020 sinus tachycardia possible left atrial enlargement left ventricular hypertrophy 125 bpm  Echocardiogram 08/28/2019 1. Mitral and pulmonic valve vegetations. Endocarditis.  2. Left ventricular ejection fraction, by estimation, is 70 to 75%. The  left ventricle has hyperdynamic function. The left ventricle has no  regional wall motion abnormalities. Left ventricular diastolic parameters  were normal.  3. Right ventricular systolic function is normal. The right ventricular  size is normal.  4. There is a anterior mitral leaflet valvular vegetation (1.1 x 0.6 cm).  The mitral valve is abnormal. Moderate mitral valve regurgitation. No  evidence of mitral stenosis.  5. The aortic valve is normal in structure. Aortic valve regurgitation is  mild. No aortic stenosis is present.  6. Possible vegetation on  pulmonic valve. The pulmonic valve was  abnormal.  7. The inferior vena cava is normal in size with greater than 50%  respiratory variability, suggesting right atrial pressure of 3 mmHg.   Assessment & Plan   1.  Essential hypertension-BP today 160/110***.    Reports compliance with medications.   Continue amlodipine Increase carvedilol to 12.5 mg twice daily Heart healthy low-sodium diet-salty 6 given Increase physical activity as tolerated Maintain blood pressure log  Mitral valve endocarditis/severe mitral regurgitation-  murmur stable.  Continues with radiation to his axillary region.  Previously suspected disruption of the anterior mitral valve leaflet resulting in eccentric jet in the posterior direction.  Previously Not a candidate for surgery due to IVDA but continues to report cessation of IV drug use.  UDS 08/27/2019 positive for opiates and cocaine. Continue carvedilol Heart healthy low-sodium diet-salty 6 given Increase physical activity as tolerated  Hypercholesterolemia-LDL 157 on 03/04/2020. Continue aspirin, atorvastatin Heart healthy low-sodium high-fiber diet.   Increase physical activity as tolerated Follows with PCP  Disposition: Follow-up with Dr. Audie Box  1 months/next available.   Jossie Ng. Leeanne Butters NP-C    08/22/2020, 1:51 PM [facility] [place] [address] Office [phone] Fax [phone]  Notice: This dictation was prepared with Dragon dictation along with smaller phrase  technology. Any transcriptional errors that result from this process are unintentional and may not be corrected upon review.  I spent***minutes examining this patient, reviewing medications, and using patient centered shared decision making involving her cardiac care.  Prior to her visit I spent greater than 20 minutes reviewing her past medical history,  medications, and prior cardiac tests.

## 2020-08-23 ENCOUNTER — Ambulatory Visit: Payer: Medicaid Other | Admitting: General Practice

## 2020-09-01 ENCOUNTER — Other Ambulatory Visit: Payer: Self-pay

## 2020-09-02 ENCOUNTER — Other Ambulatory Visit: Payer: Self-pay

## 2020-10-02 ENCOUNTER — Emergency Department (HOSPITAL_COMMUNITY): Payer: Medicaid Other

## 2020-10-02 ENCOUNTER — Inpatient Hospital Stay (HOSPITAL_COMMUNITY): Payer: Medicaid Other

## 2020-10-02 ENCOUNTER — Emergency Department (HOSPITAL_COMMUNITY): Payer: Medicaid Other | Admitting: Certified Registered Nurse Anesthetist

## 2020-10-02 ENCOUNTER — Encounter (HOSPITAL_COMMUNITY): Payer: Self-pay

## 2020-10-02 ENCOUNTER — Inpatient Hospital Stay (HOSPITAL_COMMUNITY)
Admission: EM | Admit: 2020-10-02 | Discharge: 2020-10-10 | DRG: 917 | Disposition: A | Discharge status: Home / Self Care | Payer: Medicaid Other | Attending: Internal Medicine | Admitting: Internal Medicine

## 2020-10-02 ENCOUNTER — Other Ambulatory Visit: Payer: Self-pay

## 2020-10-02 DIAGNOSIS — Z818 Family history of other mental and behavioral disorders: Secondary | ICD-10-CM

## 2020-10-02 DIAGNOSIS — Z8614 Personal history of Methicillin resistant Staphylococcus aureus infection: Secondary | ICD-10-CM

## 2020-10-02 DIAGNOSIS — E872 Acidosis, unspecified: Secondary | ICD-10-CM

## 2020-10-02 DIAGNOSIS — T424X1A Poisoning by benzodiazepines, accidental (unintentional), initial encounter: Secondary | ICD-10-CM | POA: Diagnosis present

## 2020-10-02 DIAGNOSIS — N189 Chronic kidney disease, unspecified: Secondary | ICD-10-CM | POA: Diagnosis not present

## 2020-10-02 DIAGNOSIS — Z794 Long term (current) use of insulin: Secondary | ICD-10-CM | POA: Diagnosis not present

## 2020-10-02 DIAGNOSIS — Q833 Accessory nipple: Secondary | ICD-10-CM

## 2020-10-02 DIAGNOSIS — E039 Hypothyroidism, unspecified: Secondary | ICD-10-CM | POA: Diagnosis present

## 2020-10-02 DIAGNOSIS — Z20822 Contact with and (suspected) exposure to covid-19: Secondary | ICD-10-CM | POA: Diagnosis present

## 2020-10-02 DIAGNOSIS — T40711A Poisoning by cannabis, accidental (unintentional), initial encounter: Secondary | ICD-10-CM | POA: Diagnosis present

## 2020-10-02 DIAGNOSIS — R451 Restlessness and agitation: Secondary | ICD-10-CM | POA: Diagnosis not present

## 2020-10-02 DIAGNOSIS — T884XXA Failed or difficult intubation, initial encounter: Secondary | ICD-10-CM

## 2020-10-02 DIAGNOSIS — T17908A Unspecified foreign body in respiratory tract, part unspecified causing other injury, initial encounter: Secondary | ICD-10-CM

## 2020-10-02 DIAGNOSIS — E10649 Type 1 diabetes mellitus with hypoglycemia without coma: Secondary | ICD-10-CM | POA: Diagnosis not present

## 2020-10-02 DIAGNOSIS — B1920 Unspecified viral hepatitis C without hepatic coma: Secondary | ICD-10-CM | POA: Diagnosis present

## 2020-10-02 DIAGNOSIS — J9601 Acute respiratory failure with hypoxia: Secondary | ICD-10-CM | POA: Diagnosis present

## 2020-10-02 DIAGNOSIS — Z79899 Other long term (current) drug therapy: Secondary | ICD-10-CM

## 2020-10-02 DIAGNOSIS — Z833 Family history of diabetes mellitus: Secondary | ICD-10-CM

## 2020-10-02 DIAGNOSIS — G9341 Metabolic encephalopathy: Secondary | ICD-10-CM | POA: Diagnosis not present

## 2020-10-02 DIAGNOSIS — E1310 Other specified diabetes mellitus with ketoacidosis without coma: Secondary | ICD-10-CM | POA: Diagnosis not present

## 2020-10-02 DIAGNOSIS — J690 Pneumonitis due to inhalation of food and vomit: Secondary | ICD-10-CM | POA: Diagnosis present

## 2020-10-02 DIAGNOSIS — F05 Delirium due to known physiological condition: Secondary | ICD-10-CM

## 2020-10-02 DIAGNOSIS — I130 Hypertensive heart and chronic kidney disease with heart failure and stage 1 through stage 4 chronic kidney disease, or unspecified chronic kidney disease: Secondary | ICD-10-CM | POA: Diagnosis present

## 2020-10-02 DIAGNOSIS — Z452 Encounter for adjustment and management of vascular access device: Secondary | ICD-10-CM

## 2020-10-02 DIAGNOSIS — D509 Iron deficiency anemia, unspecified: Secondary | ICD-10-CM | POA: Diagnosis present

## 2020-10-02 DIAGNOSIS — E0910 Drug or chemical induced diabetes mellitus with ketoacidosis without coma: Secondary | ICD-10-CM | POA: Diagnosis not present

## 2020-10-02 DIAGNOSIS — F19239 Other psychoactive substance dependence with withdrawal, unspecified: Secondary | ICD-10-CM | POA: Diagnosis not present

## 2020-10-02 DIAGNOSIS — F17210 Nicotine dependence, cigarettes, uncomplicated: Secondary | ICD-10-CM | POA: Diagnosis present

## 2020-10-02 DIAGNOSIS — N1832 Chronic kidney disease, stage 3b: Secondary | ICD-10-CM | POA: Diagnosis present

## 2020-10-02 DIAGNOSIS — E1010 Type 1 diabetes mellitus with ketoacidosis without coma: Secondary | ICD-10-CM | POA: Diagnosis present

## 2020-10-02 DIAGNOSIS — F1910 Other psychoactive substance abuse, uncomplicated: Secondary | ICD-10-CM | POA: Diagnosis not present

## 2020-10-02 DIAGNOSIS — N179 Acute kidney failure, unspecified: Secondary | ICD-10-CM

## 2020-10-02 DIAGNOSIS — Z8249 Family history of ischemic heart disease and other diseases of the circulatory system: Secondary | ICD-10-CM | POA: Diagnosis not present

## 2020-10-02 DIAGNOSIS — J9690 Respiratory failure, unspecified, unspecified whether with hypoxia or hypercapnia: Secondary | ICD-10-CM

## 2020-10-02 DIAGNOSIS — E0810 Diabetes mellitus due to underlying condition with ketoacidosis without coma: Secondary | ICD-10-CM | POA: Diagnosis not present

## 2020-10-02 DIAGNOSIS — R4182 Altered mental status, unspecified: Secondary | ICD-10-CM

## 2020-10-02 DIAGNOSIS — I509 Heart failure, unspecified: Secondary | ICD-10-CM | POA: Diagnosis present

## 2020-10-02 DIAGNOSIS — E1110 Type 2 diabetes mellitus with ketoacidosis without coma: Secondary | ICD-10-CM | POA: Diagnosis present

## 2020-10-02 DIAGNOSIS — Z4659 Encounter for fitting and adjustment of other gastrointestinal appliance and device: Secondary | ICD-10-CM

## 2020-10-02 DIAGNOSIS — K219 Gastro-esophageal reflux disease without esophagitis: Secondary | ICD-10-CM | POA: Diagnosis present

## 2020-10-02 DIAGNOSIS — T43621A Poisoning by amphetamines, accidental (unintentional), initial encounter: Secondary | ICD-10-CM | POA: Diagnosis present

## 2020-10-02 DIAGNOSIS — R739 Hyperglycemia, unspecified: Secondary | ICD-10-CM

## 2020-10-02 HISTORY — DX: Necrotizing fasciitis: M72.6

## 2020-10-02 LAB — CBC WITH DIFFERENTIAL/PLATELET
Abs Immature Granulocytes: 0.04 10*3/uL (ref 0.00–0.07)
Basophils Absolute: 0.1 10*3/uL (ref 0.0–0.1)
Basophils Relative: 1 %
Eosinophils Absolute: 0.2 10*3/uL (ref 0.0–0.5)
Eosinophils Relative: 2 %
HCT: 38.9 % — ABNORMAL LOW (ref 39.0–52.0)
Hemoglobin: 13.4 g/dL (ref 13.0–17.0)
Immature Granulocytes: 0 %
Lymphocytes Relative: 17 %
Lymphs Abs: 2.2 10*3/uL (ref 0.7–4.0)
MCH: 28.8 pg (ref 26.0–34.0)
MCHC: 34.4 g/dL (ref 30.0–36.0)
MCV: 83.7 fL (ref 80.0–100.0)
Monocytes Absolute: 0.5 10*3/uL (ref 0.1–1.0)
Monocytes Relative: 4 %
Neutro Abs: 9.6 10*3/uL — ABNORMAL HIGH (ref 1.7–7.7)
Neutrophils Relative %: 76 %
Platelets: 376 10*3/uL (ref 150–400)
RBC: 4.65 MIL/uL (ref 4.22–5.81)
RDW: 12.6 % (ref 11.5–15.5)
WBC: 12.7 10*3/uL — ABNORMAL HIGH (ref 4.0–10.5)
nRBC: 0 % (ref 0.0–0.2)

## 2020-10-02 LAB — BASIC METABOLIC PANEL
Anion gap: 21 — ABNORMAL HIGH (ref 5–15)
Anion gap: 7 (ref 5–15)
Anion gap: 7 (ref 5–15)
BUN: 28 mg/dL — ABNORMAL HIGH (ref 6–20)
BUN: 26 mg/dL — ABNORMAL HIGH (ref 6–20)
BUN: 27 mg/dL — ABNORMAL HIGH (ref 6–20)
CO2: 17 mmol/L — ABNORMAL LOW (ref 22–32)
CO2: 26 mmol/L (ref 22–32)
CO2: 24 mmol/L (ref 22–32)
Calcium: 9.7 mg/dL (ref 8.9–10.3)
Calcium: 8.7 mg/dL — ABNORMAL LOW (ref 8.9–10.3)
Calcium: 8.3 mg/dL — ABNORMAL LOW (ref 8.9–10.3)
Chloride: 95 mmol/L — ABNORMAL LOW (ref 98–111)
Chloride: 110 mmol/L (ref 98–111)
Chloride: 110 mmol/L (ref 98–111)
Creatinine, Ser: 1.93 mg/dL — ABNORMAL HIGH (ref 0.61–1.24)
Creatinine, Ser: 1.48 mg/dL — ABNORMAL HIGH (ref 0.61–1.24)
Creatinine, Ser: 1.55 mg/dL — ABNORMAL HIGH (ref 0.61–1.24)
GFR, Estimated: 44 mL/min — ABNORMAL LOW (ref >60)
GFR, Estimated: >60 mL/min (ref >60)
GFR, Estimated: 58 mL/min — ABNORMAL LOW (ref >60)
Glucose, Bld: 823 mg/dL (ref 70–99)
Glucose, Bld: 112 mg/dL — ABNORMAL HIGH (ref 70–99)
Glucose, Bld: 165 mg/dL — ABNORMAL HIGH (ref 70–99)
Potassium: 4.9 mmol/L (ref 3.5–5.1)
Potassium: 3.7 mmol/L (ref 3.5–5.1)
Potassium: 3.6 mmol/L (ref 3.5–5.1)
Sodium: 133 mmol/L — ABNORMAL LOW (ref 135–145)
Sodium: 143 mmol/L (ref 135–145)
Sodium: 141 mmol/L (ref 135–145)

## 2020-10-02 LAB — URINALYSIS, ROUTINE W REFLEX MICROSCOPIC
Bacteria, UA: NONE SEEN
Bilirubin Urine: NEGATIVE
Glucose, UA: >=500 mg/dL — AB
Ketones, ur: 5 mg/dL — AB
Leukocytes,Ua: NEGATIVE
Nitrite: NEGATIVE
Protein, ur: 30 mg/dL — AB
Specific Gravity, Urine: 1.024 (ref 1.005–1.030)
pH: 6 (ref 5.0–8.0)

## 2020-10-02 LAB — BLOOD GAS, ARTERIAL
Acid-base deficit: 5.6 mmol/L — ABNORMAL HIGH (ref 0.0–2.0)
Acid-base deficit: 2.3 mmol/L — ABNORMAL HIGH (ref 0.0–2.0)
Acid-base deficit: 1.8 mmol/L (ref 0.0–2.0)
Bicarbonate: 18.1 mmol/L — ABNORMAL LOW (ref 20.0–28.0)
Bicarbonate: 23.3 mmol/L (ref 20.0–28.0)
Bicarbonate: 23.4 mmol/L (ref 20.0–28.0)
Drawn by: 308601
Drawn by: 308601
FIO2: 21
FIO2: 100
FIO2: 60
MECHVT: 560 mL
MECHVT: 420 mL
O2 Saturation: 97.5 %
O2 Saturation: 99.8 %
O2 Saturation: 96.4 %
PEEP: 5 cmH2O
PEEP: 5 cmH2O
Patient temperature: 99.3
Patient temperature: 98.6
Patient temperature: 98.6
RATE: 16 resp/min
RATE: 22 resp/min
pCO2 arterial: 32.1 mmHg (ref 32.0–48.0)
pCO2 arterial: 46 mmHg (ref 32.0–48.0)
pCO2 arterial: 44.1 mmHg (ref 32.0–48.0)
pH, Arterial: 7.372 (ref 7.350–7.450)
pH, Arterial: 7.325 — ABNORMAL LOW (ref 7.350–7.450)
pH, Arterial: 7.345 — ABNORMAL LOW (ref 7.350–7.450)
pO2, Arterial: 111 mmHg — ABNORMAL HIGH (ref 83.0–108.0)
pO2, Arterial: 302 mmHg — ABNORMAL HIGH (ref 83.0–108.0)
pO2, Arterial: 91.4 mmHg (ref 83.0–108.0)

## 2020-10-02 LAB — BETA-HYDROXYBUTYRIC ACID
Beta-Hydroxybutyric Acid: 0.46 mmol/L — ABNORMAL HIGH (ref 0.05–0.27)
Beta-Hydroxybutyric Acid: 0.08 mmol/L (ref 0.05–0.27)

## 2020-10-02 LAB — RESP PANEL BY RT-PCR (FLU A&B, COVID) ARPGX2
Influenza A by PCR: NEGATIVE
Influenza B by PCR: NEGATIVE
SARS Coronavirus 2 by RT PCR: NEGATIVE

## 2020-10-02 LAB — GLUCOSE, CAPILLARY
Glucose-Capillary: 282 mg/dL — ABNORMAL HIGH (ref 70–99)
Glucose-Capillary: 225 mg/dL — ABNORMAL HIGH (ref 70–99)
Glucose-Capillary: 113 mg/dL — ABNORMAL HIGH (ref 70–99)
Glucose-Capillary: 98 mg/dL (ref 70–99)
Glucose-Capillary: 130 mg/dL — ABNORMAL HIGH (ref 70–99)
Glucose-Capillary: 190 mg/dL — ABNORMAL HIGH (ref 70–99)
Glucose-Capillary: 175 mg/dL — ABNORMAL HIGH (ref 70–99)
Glucose-Capillary: 178 mg/dL — ABNORMAL HIGH (ref 70–99)
Glucose-Capillary: 155 mg/dL — ABNORMAL HIGH (ref 70–99)
Glucose-Capillary: 118 mg/dL — ABNORMAL HIGH (ref 70–99)

## 2020-10-02 LAB — CBG MONITORING, ED
Glucose-Capillary: >600 mg/dL (ref 70–99)
Glucose-Capillary: >600 mg/dL (ref 70–99)
Glucose-Capillary: 446 mg/dL — ABNORMAL HIGH (ref 70–99)
Glucose-Capillary: 351 mg/dL — ABNORMAL HIGH (ref 70–99)
Glucose-Capillary: 397 mg/dL — ABNORMAL HIGH (ref 70–99)
Glucose-Capillary: 225 mg/dL — ABNORMAL HIGH (ref 70–99)

## 2020-10-02 LAB — RAPID URINE DRUG SCREEN, HOSP PERFORMED
Amphetamines: POSITIVE — AB
Barbiturates: NOT DETECTED
Benzodiazepines: POSITIVE — AB
Cocaine: NOT DETECTED
Opiates: NOT DETECTED
Tetrahydrocannabinol: POSITIVE — AB

## 2020-10-02 LAB — CK: Total CK: 666 U/L — ABNORMAL HIGH (ref 49–397)

## 2020-10-02 LAB — ETHANOL: Alcohol, Ethyl (B): <10 mg/dL (ref <10)

## 2020-10-02 LAB — MRSA NEXT GEN BY PCR, NASAL: MRSA by PCR Next Gen: NOT DETECTED

## 2020-10-02 LAB — HIV ANTIBODY (ROUTINE TESTING W REFLEX): HIV Screen 4th Generation wRfx: NONREACTIVE

## 2020-10-02 MED ORDER — ZIPRASIDONE MESYLATE 20 MG IM SOLR
INTRAMUSCULAR | Status: AC
  Filled 2020-10-02: qty 20

## 2020-10-02 MED ORDER — CHLORHEXIDINE GLUCONATE 0.12% ORAL RINSE (MEDLINE KIT)
15.0000 mL | Freq: Two times a day (BID) | OROMUCOSAL | Status: DC
  Administered 2020-10-02 – 2020-10-07 (×12): 15 mL via OROMUCOSAL

## 2020-10-02 MED ORDER — FENTANYL 2500MCG IN NS 250ML (10MCG/ML) PREMIX INFUSION
0.0000 ug/h | INTRAVENOUS | Status: DC
Start: 2020-10-02 — End: 2020-10-02
  Administered 2020-10-02: 50 ug/h via INTRAVENOUS
  Filled 2020-10-02: qty 250

## 2020-10-02 MED ORDER — ZIPRASIDONE MESYLATE 20 MG IM SOLR
20.0000 mg | Freq: Once | INTRAMUSCULAR | Status: AC
  Administered 2020-10-02: 20 mg via INTRAMUSCULAR

## 2020-10-02 MED ORDER — SUCCINYLCHOLINE CHLORIDE 200 MG/10ML IV SOSY
PREFILLED_SYRINGE | INTRAVENOUS | Status: AC
  Administered 2020-10-02: 100 mg via INTRAVENOUS
  Filled 2020-10-02: qty 10

## 2020-10-02 MED ORDER — FAMOTIDINE IN NACL 20-0.9 MG/50ML-% IV SOLN
20.0000 mg | Freq: Two times a day (BID) | INTRAVENOUS | Status: DC
  Administered 2020-10-02 – 2020-10-09 (×15): 20 mg via INTRAVENOUS
  Filled 2020-10-02 (×15): qty 50

## 2020-10-02 MED ORDER — PROPOFOL 1000 MG/100ML IV EMUL
5.0000 ug/kg/min | INTRAVENOUS | Status: DC
  Administered 2020-10-02: 60 ug/kg/min via INTRAVENOUS
  Administered 2020-10-02: 40 ug/kg/min via INTRAVENOUS
  Administered 2020-10-02: 5 ug/kg/min via INTRAVENOUS
  Administered 2020-10-02 (×2): 60 ug/kg/min via INTRAVENOUS
  Administered 2020-10-03: 30 ug/kg/min via INTRAVENOUS
  Administered 2020-10-03: 40 ug/kg/min via INTRAVENOUS
  Administered 2020-10-03: 30 ug/kg/min via INTRAVENOUS
  Administered 2020-10-03 – 2020-10-04 (×2): 40 ug/kg/min via INTRAVENOUS
  Administered 2020-10-04: 50 ug/kg/min via INTRAVENOUS
  Administered 2020-10-04 (×2): 40 ug/kg/min via INTRAVENOUS
  Administered 2020-10-04: 60 ug/kg/min via INTRAVENOUS
  Administered 2020-10-05: 50 ug/kg/min via INTRAVENOUS
  Administered 2020-10-05: 40 ug/kg/min via INTRAVENOUS
  Administered 2020-10-05: 60 ug/kg/min via INTRAVENOUS
  Administered 2020-10-05 – 2020-10-06 (×4): 40 ug/kg/min via INTRAVENOUS
  Administered 2020-10-06 (×2): 50 ug/kg/min via INTRAVENOUS
  Administered 2020-10-06: 40 ug/kg/min via INTRAVENOUS
  Administered 2020-10-07: 50 ug/kg/min via INTRAVENOUS
  Administered 2020-10-07: 40 ug/kg/min via INTRAVENOUS
  Filled 2020-10-02 (×9): qty 100
  Filled 2020-10-02: qty 200
  Filled 2020-10-02 (×8): qty 100
  Filled 2020-10-02: qty 200
  Filled 2020-10-02 (×8): qty 100

## 2020-10-02 MED ORDER — LACTATED RINGERS IV BOLUS
1000.0000 mL | Freq: Once | INTRAVENOUS | Status: AC
  Administered 2020-10-02: 1000 mL via INTRAVENOUS

## 2020-10-02 MED ORDER — SUCCINYLCHOLINE CHLORIDE 20 MG/ML IJ SOLN
INTRAMUSCULAR | Status: AC | PRN
  Administered 2020-10-02: 50 mg via INTRAVENOUS

## 2020-10-02 MED ORDER — VECURONIUM BROMIDE 10 MG IV SOLR
10.0000 mg | Freq: Once | INTRAVENOUS | Status: AC
  Administered 2020-10-02: 10 mg via INTRAVENOUS
  Filled 2020-10-02: qty 10

## 2020-10-02 MED ORDER — LACTATED RINGERS IV SOLN: INTRAVENOUS | Status: DC

## 2020-10-02 MED ORDER — LACTATED RINGERS IV BOLUS
20.0000 mL/kg | Freq: Once | INTRAVENOUS | Status: AC
  Administered 2020-10-02: 1634 mL via INTRAVENOUS

## 2020-10-02 MED ORDER — DEXMEDETOMIDINE HCL IN NACL 200 MCG/50ML IV SOLN
0.4000 ug/kg/h | INTRAVENOUS | Status: DC
Start: 2020-10-02 — End: 2020-10-06
  Administered 2020-10-02: 1.2 ug/kg/h via INTRAVENOUS
  Administered 2020-10-02 (×2): 0.7 ug/kg/h via INTRAVENOUS
  Administered 2020-10-02: 0.8 ug/kg/h via INTRAVENOUS
  Administered 2020-10-02: 0.6 ug/kg/h via INTRAVENOUS
  Administered 2020-10-02 – 2020-10-04 (×12): 1.2 ug/kg/h via INTRAVENOUS
  Administered 2020-10-04: 1.22 ug/kg/h via INTRAVENOUS
  Administered 2020-10-04 – 2020-10-06 (×15): 1.2 ug/kg/h via INTRAVENOUS
  Administered 2020-10-06: 1 ug/kg/h via INTRAVENOUS
  Administered 2020-10-06: 0.8 ug/kg/h via INTRAVENOUS
  Administered 2020-10-06 (×2): 1.2 ug/kg/h via INTRAVENOUS
  Filled 2020-10-02: qty 50
  Filled 2020-10-02: qty 150
  Filled 2020-10-02: qty 100
  Filled 2020-10-02 (×2): qty 50
  Filled 2020-10-02 (×2): qty 100
  Filled 2020-10-02 (×5): qty 50
  Filled 2020-10-02: qty 100
  Filled 2020-10-02: qty 50
  Filled 2020-10-02: qty 100
  Filled 2020-10-02 (×5): qty 50
  Filled 2020-10-02: qty 100
  Filled 2020-10-02 (×3): qty 50
  Filled 2020-10-02: qty 100
  Filled 2020-10-02 (×11): qty 50

## 2020-10-02 MED ORDER — LIDOCAINE-EPINEPHRINE 2 %-1:100000 IJ SOLN
20.0000 mL | Freq: Once | INTRAMUSCULAR | Status: DC
  Filled 2020-10-02: qty 1

## 2020-10-02 MED ORDER — KETAMINE HCL 50 MG/5ML IJ SOSY
PREFILLED_SYRINGE | INTRAMUSCULAR | Status: AC
  Filled 2020-10-02: qty 5

## 2020-10-02 MED ORDER — ETOMIDATE 2 MG/ML IV SOLN
20.0000 mg | Freq: Once | INTRAVENOUS | Status: AC
  Filled 2020-10-02: qty 10

## 2020-10-02 MED ORDER — DOCUSATE SODIUM 50 MG/5ML PO LIQD
100.0000 mg | Freq: Two times a day (BID) | ORAL | Status: DC | PRN
  Filled 2020-10-02: qty 10

## 2020-10-02 MED ORDER — ONDANSETRON HCL 4 MG/2ML IJ SOLN
4.0000 mg | Freq: Four times a day (QID) | INTRAMUSCULAR | Status: DC | PRN
  Administered 2020-10-06: 4 mg via INTRAVENOUS
  Filled 2020-10-02: qty 2

## 2020-10-02 MED ORDER — SUCCINYLCHOLINE CHLORIDE 20 MG/ML IJ SOLN
100.0000 mg | Freq: Once | INTRAMUSCULAR | Status: AC
  Filled 2020-10-02: qty 5

## 2020-10-02 MED ORDER — CHLORHEXIDINE GLUCONATE CLOTH 2 % EX PADS
6.0000 | MEDICATED_PAD | Freq: Every day | CUTANEOUS | Status: DC
  Administered 2020-10-04 – 2020-10-10 (×7): 6 via TOPICAL

## 2020-10-02 MED ORDER — FENTANYL 2500MCG IN NS 250ML (10MCG/ML) PREMIX INFUSION
0.0000 ug/h | INTRAVENOUS | Status: DC
Start: 2020-10-02 — End: 2020-10-07
  Administered 2020-10-03: 150 ug/h via INTRAVENOUS
  Administered 2020-10-03: 250 ug/h via INTRAVENOUS
  Administered 2020-10-04 (×2): 225 ug/h via INTRAVENOUS
  Administered 2020-10-05: 250 ug/h via INTRAVENOUS
  Administered 2020-10-05 – 2020-10-06 (×3): 200 ug/h via INTRAVENOUS
  Administered 2020-10-07: 250 ug/h via INTRAVENOUS
  Administered 2020-10-07: 200 ug/h via INTRAVENOUS
  Filled 2020-10-02 (×10): qty 250

## 2020-10-02 MED ORDER — MIDAZOLAM HCL 2 MG/2ML IJ SOLN: 5.0000 mg | Freq: Once | INTRAMUSCULAR | Status: AC

## 2020-10-02 MED ORDER — INSULIN ASPART 100 UNIT/ML IJ SOLN
3.0000 [IU] | INTRAMUSCULAR | Status: DC
  Administered 2020-10-02 – 2020-10-04 (×6): 6 [IU] via SUBCUTANEOUS
  Administered 2020-10-05 (×2): 9 [IU] via SUBCUTANEOUS

## 2020-10-02 MED ORDER — KETAMINE HCL 50 MG/ML IJ SOLN
4.0000 mg/kg | Freq: Once | INTRAMUSCULAR | Status: AC
  Administered 2020-10-02: 320 mg via INTRAMUSCULAR
  Filled 2020-10-02: qty 10

## 2020-10-02 MED ORDER — SODIUM CHLORIDE 0.9 % IV SOLN
3.0000 g | Freq: Four times a day (QID) | INTRAVENOUS | Status: DC
  Administered 2020-10-02 – 2020-10-08 (×25): 3 g via INTRAVENOUS
  Filled 2020-10-02: qty 8
  Filled 2020-10-02: qty 3
  Filled 2020-10-02 (×22): qty 8
  Filled 2020-10-02 (×2): qty 3
  Filled 2020-10-02: qty 8

## 2020-10-02 MED ORDER — DIAZEPAM 2.5 MG RE GEL
10.0000 mg | Freq: Once | RECTAL | Status: DC | PRN
  Filled 2020-10-02: qty 4

## 2020-10-02 MED ORDER — LABETALOL HCL 5 MG/ML IV SOLN
20.0000 mg | Freq: Once | INTRAVENOUS | Status: AC
  Administered 2020-10-02: 20 mg via INTRAVENOUS
  Filled 2020-10-02: qty 4

## 2020-10-02 MED ORDER — POLYETHYLENE GLYCOL 3350 17 G PO PACK: 17.0000 g | PACK | Freq: Every day | ORAL | Status: DC | PRN

## 2020-10-02 MED ORDER — CHLORHEXIDINE GLUCONATE CLOTH 2 % EX PADS
6.0000 | MEDICATED_PAD | Freq: Every day | CUTANEOUS | Status: DC
  Administered 2020-10-02 (×2): 6 via TOPICAL

## 2020-10-02 MED ORDER — DEXTROSE IN LACTATED RINGERS 5 % IV SOLN: INTRAVENOUS | Status: DC

## 2020-10-02 MED ORDER — VECURONIUM BROMIDE 10 MG IV SOLR: 0.1000 mg/kg | Freq: Once | INTRAVENOUS | Status: DC

## 2020-10-02 MED ORDER — SODIUM CHLORIDE 0.9% FLUSH: 10.0000 mL | INTRAVENOUS | Status: DC | PRN

## 2020-10-02 MED ORDER — DEXTROSE 50 % IV SOLN
0.0000 mL | INTRAVENOUS | Status: DC | PRN
  Administered 2020-10-03: 50 mL via INTRAVENOUS
  Filled 2020-10-02: qty 50

## 2020-10-02 MED ORDER — INSULIN REGULAR(HUMAN) IN NACL 100-0.9 UT/100ML-% IV SOLN
INTRAVENOUS | Status: DC
  Administered 2020-10-02: 5.5 [IU]/h via INTRAVENOUS

## 2020-10-02 MED ORDER — FENTANYL CITRATE (PF) 100 MCG/2ML IJ SOLN
25.0000 ug | INTRAMUSCULAR | Status: DC | PRN
Start: 2020-10-02 — End: 2020-10-09
  Administered 2020-10-02 – 2020-10-03 (×2): 100 ug via INTRAVENOUS
  Administered 2020-10-03: 50 ug via INTRAVENOUS
  Administered 2020-10-04 (×6): 100 ug via INTRAVENOUS
  Administered 2020-10-06: 50 ug via INTRAVENOUS
  Administered 2020-10-07 (×2): 100 ug via INTRAVENOUS
  Administered 2020-10-07 (×3): 50 ug via INTRAVENOUS
  Administered 2020-10-07 – 2020-10-09 (×11): 100 ug via INTRAVENOUS
  Administered 2020-10-09: 75 ug via INTRAVENOUS
  Administered 2020-10-09: 100 ug via INTRAVENOUS
  Filled 2020-10-02 (×16): qty 2

## 2020-10-02 MED ORDER — ACETAMINOPHEN 325 MG PO TABS
650.0000 mg | ORAL_TABLET | Freq: Four times a day (QID) | ORAL | Status: DC | PRN
  Administered 2020-10-02 – 2020-10-06 (×5): 650 mg
  Filled 2020-10-02 (×5): qty 2

## 2020-10-02 MED ORDER — ENOXAPARIN SODIUM 40 MG/0.4ML IJ SOSY
40.0000 mg | PREFILLED_SYRINGE | INTRAMUSCULAR | Status: DC
  Administered 2020-10-02 – 2020-10-08 (×7): 40 mg via SUBCUTANEOUS
  Filled 2020-10-02 (×8): qty 0.4

## 2020-10-02 MED ORDER — DEXTROSE 50 % IV SOLN: 0.0000 mL | INTRAVENOUS | Status: DC | PRN

## 2020-10-02 MED ORDER — STERILE WATER FOR INJECTION IJ SOLN
INTRAMUSCULAR | Status: AC
  Administered 2020-10-02: 1.2 mL
  Filled 2020-10-02: qty 10

## 2020-10-02 MED ORDER — DIAZEPAM 5 MG/ML IJ SOLN
5.0000 mg | Freq: Once | INTRAMUSCULAR | Status: AC
  Administered 2020-10-02: 5 mg via INTRAVENOUS
  Filled 2020-10-02: qty 2

## 2020-10-02 MED ORDER — SUCCINYLCHOLINE CHLORIDE 20 MG/ML IJ SOLN
INTRAMUSCULAR | Status: DC | PRN
  Administered 2020-10-02: 80 mg via INTRAVENOUS

## 2020-10-02 MED ORDER — MIDAZOLAM HCL 2 MG/2ML IJ SOLN
INTRAMUSCULAR | Status: AC
  Filled 2020-10-02: qty 6

## 2020-10-02 MED ORDER — ETOMIDATE 2 MG/ML IV SOLN
INTRAVENOUS | Status: AC
  Administered 2020-10-02: 20 mg via INTRAVENOUS
  Filled 2020-10-02: qty 10

## 2020-10-02 MED ORDER — ORAL CARE MOUTH RINSE
15.0000 mL | OROMUCOSAL | Status: DC
  Administered 2020-10-02 – 2020-10-07 (×54): 15 mL via OROMUCOSAL

## 2020-10-02 MED ORDER — DIPHENHYDRAMINE HCL 50 MG/ML IJ SOLN
INTRAMUSCULAR | Status: AC
  Administered 2020-10-02: 50 mg
  Filled 2020-10-02: qty 1

## 2020-10-02 MED ORDER — STERILE WATER FOR INJECTION IJ SOLN
INTRAMUSCULAR | Status: AC
  Administered 2020-10-02: 10 mL
  Filled 2020-10-02: qty 10

## 2020-10-02 MED ORDER — ZIPRASIDONE MESYLATE 20 MG IM SOLR: 20.0000 mg | Freq: Once | INTRAMUSCULAR | Status: DC

## 2020-10-02 MED ORDER — INSULIN REGULAR(HUMAN) IN NACL 100-0.9 UT/100ML-% IV SOLN
INTRAVENOUS | Status: DC
  Administered 2020-10-02: 8.5 [IU]/h via INTRAVENOUS
  Filled 2020-10-02: qty 100

## 2020-10-02 MED ORDER — INSULIN DETEMIR 100 UNIT/ML ~~LOC~~ SOLN
12.0000 [IU] | Freq: Two times a day (BID) | SUBCUTANEOUS | Status: DC
  Administered 2020-10-02 (×2): 12 [IU] via SUBCUTANEOUS
  Filled 2020-10-02 (×2): qty 0.12

## 2020-10-02 MED ORDER — IPRATROPIUM-ALBUTEROL 0.5-2.5 (3) MG/3ML IN SOLN
3.0000 mL | Freq: Four times a day (QID) | RESPIRATORY_TRACT | Status: DC
  Administered 2020-10-02 – 2020-10-09 (×30): 3 mL via RESPIRATORY_TRACT
  Filled 2020-10-02 (×29): qty 3

## 2020-10-02 MED ORDER — SODIUM CHLORIDE 0.9% FLUSH
10.0000 mL | Freq: Two times a day (BID) | INTRAVENOUS | Status: DC
  Administered 2020-10-02: 30 mL
  Administered 2020-10-02: 10 mL
  Administered 2020-10-03: 20 mL
  Administered 2020-10-04: 30 mL
  Administered 2020-10-04 – 2020-10-05 (×3): 10 mL
  Administered 2020-10-06: 40 mL
  Administered 2020-10-07: 10 mL
  Administered 2020-10-07 – 2020-10-08 (×2): 20 mL
  Administered 2020-10-08: 10 mL

## 2020-10-02 NOTE — ED Notes (Signed)
LR infusion discontinued. Per Dr. Florina Ou order and IV compatibilities, LR switched to Normal Saline. Normal Saline compatible with insulin and Precedx drip.

## 2020-10-02 NOTE — ED Notes (Signed)
Airway cart to bedside. Respiratory at bedside. Suction set up.

## 2020-10-02 NOTE — ED Provider Notes (Addendum)
[facility] DEPT Provider Note: Georgena Spurling, MD, FACEP  CSN: [id number] MRN: [id number] ARRIVAL: 10/02/20 at [place]: RESA/RESA   CHIEF COMPLAINT  Drug Overdose  Level 5 caveat: Altered mental status, combative, agitated HISTORY OF PRESENT ILLNESS  10/02/20 2:26 AM Luke [name] is a 40 y.o. male with a history of substance abuse and type 1 diabetes.  His wife called EMS because he "has not been himself recently".  EMS found the patient to be shaky, not verbally responsive, and on asking if the patient took drugs he shook his head no.  He has a history of heroin and methamphetamine abuse.  Due to agitation and uncooperativeness he was given 5 mg of IM haloperidol PTA. He was also given 2 mg of intranasal Narcan for possible opioid overdose without improvement.  On arrival here he continues to be agitated and combative.  He has yelled threats of shooting staff but mostly has been making nonsensical sounds.  When asked if he took any drugs he replied "everything". He was placed in four-point restraints by nursing staff, and Security was being called to assist as I entered the room.   Past Medical History:  Diagnosis Date   Anemia    Anxiety  Dx 2008   Cellulitis    Chronic kidney disease    Diabetes mellitus without complication ([place]) Dx 7169   DKA (diabetic ketoacidoses) 07/21/2017   Endocarditis 04/2019   Endocarditis    GERD (gastroesophageal reflux disease) Dx 2008   Headache(784.0)    Heart murmur    Hepatitis C    Hypertension    Hypothyroidism    MRSA infection    Necrotizing fasciitis of multiple sites ([place])    Pneumonia    Seizures ([place]) 2011   x 2 in lifetime. on Dilantin for a while.    Sepsis ([place])    Substance abuse ([place]) 2013   heroin use, multiple relapses   Type 1 diabetes [facility])     Past Surgical History:  Procedure Laterality Date   AMPUTATION Left 09/05/2018   Procedure: LEFT GREAT TOE AMPUTATION;  Surgeon: [name], MD;  Location: [place];   Service: Orthopedics;  Laterality: Left;   APPLICATION OF A-CELL OF EXTREMITY Bilateral 09/01/2019   Procedure: APPLICATION OF MESHED PRIMATRIX AG OF EXTREMITY;  Surgeon: Cindra Presume, MD;  Location: [place];  Service: Plastics;  Laterality: Bilateral;   I & D EXTREMITY Left 10/11/2012   Procedure: IRRIGATION AND DEBRIDEMENT ABSCESS FOREARM;  Surgeon: Linna Hoff, MD;  Location: [place];  Service: Orthopedics;  Laterality: Left;   I & D EXTREMITY Left 10/12/2012   Procedure: IRRIGATION AND DEBRIDEMENT FOREARM;  Surgeon: Linna Hoff, MD;  Location: [place];  Service: Orthopedics;  Laterality: Left;   I & D EXTREMITY Left 10/14/2012   Procedure: incision and drainage left forearm;  Surgeon: Linna Hoff, MD;  Location: [place];  Service: Orthopedics;  Laterality: Left;   I & D EXTREMITY Left 10/16/2012   Procedure: IRRIGATION AND DEBRIDEMENT LEFT FOREARM;  Surgeon: Linna Hoff, MD;  Location: [place];  Service: Orthopedics;  Laterality: Left;   I & D EXTREMITY Left 10/20/2012   Procedure: INCISION AND DRAINAGE AND DEBRIDEMENT LEFT  FOREARM;  Surgeon: Linna Hoff, MD;  Location: [place];  Service: Orthopedics;  Laterality: Left;   I & D EXTREMITY Right 05/22/2019   Procedure: IRRIGATION AND DEBRIDEMENT RIGHT ARM;  Surgeon: [name], MD;  Location: [place];  Service: Orthopedics;  Laterality: Right;   INCISION AND DRAINAGE OF WOUND Bilateral 11/29/2017   Procedure: DEBRIDEMENT BILATERAL FEET, DEBRIDEMENT LEFT ANKLE, AND APPLY WOUND VAC;  Surgeon: [name], MD;  Location: [place];  Service: Orthopedics;  Laterality: Bilateral;   INCISION AND DRAINAGE OF WOUND Bilateral 09/01/2019   Procedure: IRRIGATION AND DEBRIDEMENT FOREARM WOUNDS;  Surgeon: Cindra Presume, MD;  Location: [place];  Service: Plastics;  Laterality: Bilateral;   IRRIGATION AND DEBRIDEMENT ABSCESS     Hx: of left arm abscess related to drug use    TEE WITHOUT CARDIOVERSION N/A 11/28/2017   Procedure: TRANSESOPHAGEAL ECHOCARDIOGRAM  (TEE);  Surgeon: Sanda Klein, MD;  Location: [place];  Service: Cardiovascular;  Laterality: N/A;   TEE WITHOUT CARDIOVERSION N/A 05/12/2019   Procedure: TRANSESOPHAGEAL ECHOCARDIOGRAM (TEE);  Surgeon: Skeet Latch, MD;  Location: [facility] ENDOSCOPY;  Service: Cardiovascular;  Laterality: N/A;    Family History  Problem Relation Age of Onset   Diabetes Father    Heart disease Father    Mental illness Sister    Cancer Neg Hx     Social History   Tobacco Use   Smoking status: Every Day    Packs/day: 1.00    Years: 25.00    Pack years: 25.00    Types: Cigarettes   Smokeless tobacco: Never   Tobacco comments:    decreased over the last few weeks because he has not been breathing well  Vaping Use   Vaping Use: Some days  Substance Use Topics   Alcohol use: No    Alcohol/week: 0.0 standard drinks    Comment: rarely   Drug use: Not Currently    Types: Cocaine, IV, Heroin    Comment: IVDU since Dad died in Apr 15, 2006 with 15 month period of sobriety 2018-2019, relapse Aug 2019.    Prior to Admission medications   Medication Sig Start Date End Date Taking? Authorizing Provider  Accu-Chek Softclix Lancets lancets USE TO CHECK BLOOD SUGAR THREE TIMES DAILY E11.65 01/14/20 01/13/21 Yes Charlott Rakes, MD  Blood Glucose Monitoring Suppl (ACCU-CHEK GUIDE ME) w/Device KIT Use to check blood sugar TID. E11.65 01/14/20  Yes Newlin, Enobong, MD  Blood Glucose Monitoring Suppl (ACCU-CHEK GUIDE) w/Device KIT USE TO CHECK BLOOD SUGAR THREE TIMES DAILY 01/14/20 01/13/21 Yes Newlin, Enobong, MD  glucose blood test strip USE TO CHECK BLOOD SUGAR THREE TIMES DAILY E11.65 01/14/20 01/13/21 Yes Newlin, Charlane Ferretti, MD  insulin glargine (LANTUS SOLOSTAR) 100 UNIT/ML Solostar Pen Inject 40 Units into the skin 2 (two) times daily. 06/23/20  Yes Newlin, Charlane Ferretti, MD  Insulin Pen Needle (TRUEPLUS 5-BEVEL PEN NEEDLES) 32G X 4 MM MISC Use as instructed to inject insulin. 11/27/19  Yes Charlott Rakes, MD  Insulin  Pen Needle 31G X 5 MM MISC USE AS INSTRUCTED TO INJECT INSULIN. 11/27/19 11/26/20 Yes Charlott Rakes, MD  SUBOXONE 8-2 MG FILM SMARTSIG:1 Strip(s) By Mouth 3 Times Daily 07/14/20   [provider]    Allergies Patient has no known allergies.   REVIEW OF SYSTEMS  Level 5 caveat   PHYSICAL EXAMINATION  Initial Vital Signs Blood pressure (!) 103/57, pulse (!) 114, temperature 99.3 F (37.4 C), temperature source Oral, resp. rate (!) 22, height 5' 9" (1.753 m), weight 81.7 kg, SpO2 97 %.  Examination (some elements completed after patient was sedated) General: Well-developed, well-nourished male in no acute distress; appearance consistent with age of record HENT: normocephalic; atraumatic Eyes: pupils equal, round and sluggish  Neck: supple Heart: regular rate and  rhythm; tachycardia Lungs: clear to auscultation bilaterally Chest: left accessory nipple Abdomen: soft; nondistended; bowel sounds present Extremities: No acute deformity; normal range of motion; pulses normal; extensive scarring of both upper extremities, notably both antecubital fossae Neurologic: Awake, alert, minimally verbal; moving all extremities Skin: profoundly diaphoretic Psychiatric: Agitated; combative; making nonsensical noises (whooping like a monkey); occasional verbal threats   RESULTS  Summary of this visit's results, reviewed and interpreted by myself:   EKG Interpretation  Date/Time:    Ventricular Rate:    PR Interval:    QRS Duration:   QT Interval:    QTC Calculation:   R Axis:     Text Interpretation:         Laboratory Studies: Results for orders placed or performed during the hospital encounter of 10/02/20 (from the past 24 hour(s))  CBG monitoring, ED     Status: Abnormal   Collection Time: 10/02/20  1:57 AM  Result Value Ref Range   Glucose-Capillary >600 (HH) 70 - 99 mg/dL  Basic metabolic panel     Status: Abnormal   Collection Time: 10/02/20  2:40 AM  Result Value  Ref Range   Sodium 133 (L) 135 - 145 mmol/L   Potassium 4.9 3.5 - 5.1 mmol/L   Chloride 95 (L) 98 - 111 mmol/L   CO2 17 (L) 22 - 32 mmol/L   Glucose, Bld 823 (HH) 70 - 99 mg/dL   BUN 28 (H) 6 - 20 mg/dL   Creatinine, Ser 1.93 (H) 0.61 - 1.24 mg/dL   Calcium 9.7 8.9 - 10.3 mg/dL   GFR, Estimated 44 (L) >60 mL/min   Anion gap 21 (H) 5 - 15  Beta-hydroxybutyric acid     Status: Abnormal   Collection Time: 10/02/20  2:40 AM  Result Value Ref Range   Beta-Hydroxybutyric Acid 0.46 (H) 0.05 - 0.27 mmol/L  CBC with Differential (PNL)     Status: Abnormal   Collection Time: 10/02/20  2:40 AM  Result Value Ref Range   WBC 12.7 (H) 4.0 - 10.5 K/uL   RBC 4.65 4.22 - 5.81 MIL/uL   Hemoglobin 13.4 13.0 - 17.0 g/dL   HCT 38.9 (L) 39.0 - 52.0 %   MCV 83.7 80.0 - 100.0 fL   MCH 28.8 26.0 - 34.0 pg   MCHC 34.4 30.0 - 36.0 g/dL   RDW 12.6 11.5 - 15.5 %   Platelets 376 150 - 400 K/uL   nRBC 0.0 0.0 - 0.2 %   Neutrophils Relative % 76 %   Neutro Abs 9.6 (H) 1.7 - 7.7 K/uL   Lymphocytes Relative 17 %   Lymphs Abs 2.2 0.7 - 4.0 K/uL   Monocytes Relative 4 %   Monocytes Absolute 0.5 0.1 - 1.0 K/uL   Eosinophils Relative 2 %   Eosinophils Absolute 0.2 0.0 - 0.5 K/uL   Basophils Relative 1 %   Basophils Absolute 0.1 0.0 - 0.1 K/uL   Immature Granulocytes 0 %   Abs Immature Granulocytes 0.04 0.00 - 0.07 K/uL  Ethanol     Status: None   Collection Time: 10/02/20  2:40 AM  Result Value Ref Range   Alcohol, Ethyl (B) <10 <10 mg/dL  CK     Status: Abnormal   Collection Time: 10/02/20  2:40 AM  Result Value Ref Range   Total CK 666 (H) 49 - 397 U/L  Resp Panel by RT-PCR (Flu A&B, Covid) Nasopharyngeal Swab     Status: None   Collection Time: 10/02/20  3:46 AM   Specimen: Nasopharyngeal Swab; Nasopharyngeal(NP) swabs in vial transport medium  Result Value Ref Range   SARS Coronavirus 2 by RT PCR NEGATIVE NEGATIVE   Influenza A by PCR NEGATIVE NEGATIVE   Influenza B by PCR NEGATIVE NEGATIVE   Blood gas, arterial (at [facility])     Status: Abnormal   Collection Time: 10/02/20  3:57 AM  Result Value Ref Range   FIO2 21.00    pH, Arterial 7.372 7.350 - 7.450   pCO2 arterial 32.1 32.0 - 48.0 mmHg   pO2, Arterial 111 (H) 83.0 - 108.0 mmHg   Bicarbonate 18.1 (L) 20.0 - 28.0 mmol/L   Acid-base deficit 5.6 (H) 0.0 - 2.0 mmol/L   O2 Saturation 97.5 %   Patient temperature 99.3    Collection site LEFT RADIAL    Allens test (pass/fail) PASS PASS  Urinalysis, Routine w reflex microscopic Urine, In & Out Cath     Status: Abnormal   Collection Time: 10/02/20  4:05 AM  Result Value Ref Range   Color, Urine STRAW (A) YELLOW   APPearance CLEAR CLEAR   Specific Gravity, Urine 1.024 1.005 - 1.030   pH 6.0 5.0 - 8.0   Glucose, UA >=500 (A) NEGATIVE mg/dL   Hgb urine dipstick MODERATE (A) NEGATIVE   Bilirubin Urine NEGATIVE NEGATIVE   Ketones, ur 5 (A) NEGATIVE mg/dL   Protein, ur 30 (A) NEGATIVE mg/dL   Nitrite NEGATIVE NEGATIVE   Leukocytes,Ua NEGATIVE NEGATIVE   RBC / HPF 0-5 0 - 5 RBC/hpf   Bacteria, UA NONE SEEN NONE SEEN  Rapid urine drug screen (hospital performed)     Status: Abnormal   Collection Time: 10/02/20  4:05 AM  Result Value Ref Range   Opiates NONE DETECTED NONE DETECTED   Cocaine NONE DETECTED NONE DETECTED   Benzodiazepines POSITIVE (A) NONE DETECTED   Amphetamines POSITIVE (A) NONE DETECTED   Tetrahydrocannabinol POSITIVE (A) NONE DETECTED   Barbiturates NONE DETECTED NONE DETECTED  CBG monitoring, ED     Status: Abnormal   Collection Time: 10/02/20  4:20 AM  Result Value Ref Range   Glucose-Capillary >600 (HH) 70 - 99 mg/dL  CBG monitoring, ED     Status: Abnormal   Collection Time: 10/02/20  4:54 AM  Result Value Ref Range   Glucose-Capillary 446 (H) 70 - 99 mg/dL  CBG monitoring, ED     Status: Abnormal   Collection Time: 10/02/20  5:30 AM  Result Value Ref Range   Glucose-Capillary 397 (H) 70 - 99 mg/dL  CBG monitoring, ED     Status: Abnormal    Collection Time: 10/02/20  6:14 AM  Result Value Ref Range   Glucose-Capillary 351 (H) 70 - 99 mg/dL   Imaging Studies: DG Chest Port 1 View  Result Date: 10/02/2020 CLINICAL DATA:  40 year old male with history of difficult intubation. EXAM: PORTABLE CHEST 1 VIEW COMPARISON:  Chest x-ray 09/15/2019. FINDINGS: An endotracheal tube is in place with tip 5.7 cm above the carina. A nasogastric tube is seen extending into the stomach, however, the tip of the nasogastric tube extends below the lower margin of the image. Compared to the prior examination there has been improvement in aeration throughout the lungs bilaterally with significant regression of multifocal airspace disease. Some residual airspace consolidation is noted in the perihilar aspect of the right lung, somewhat mass-like in appearance. Left lung appears relatively clear. No pleural effusions. No pneumothorax. No evidence of pulmonary edema. Heart size is normal.  IMPRESSION: 1. Support apparatus, as above. 2. Improving aeration in the lungs bilaterally with some residual airspace consolidation in the perihilar aspect of the right lung. Electronically Signed   By: Vinnie Langton M.D.   On: 10/02/2020 06:41   DG Abd Portable 1V  Result Date: 10/02/2020 CLINICAL DATA:  40 year old male status post orogastric tube placement. EXAM: PORTABLE ABDOMEN - 1 VIEW COMPARISON:  No priors. FINDINGS: Enteric tube in position with tip and side port projecting over the stomach. Visualized bowel gas pattern is unremarkable. IMPRESSION: 1. Tip and side port of enteric tube project over the stomach. Electronically Signed   By: Vinnie Langton M.D.   On: 10/02/2020 06:43    ED COURSE and MDM  Nursing notes, initial and subsequent vitals signs, including pulse oximetry, reviewed and interpreted by myself.  Vitals:   10/02/20 0632 10/02/20 0645 10/02/20 0655 10/02/20 0700  BP: (!) 144/85 134/78 131/77 129/75  Pulse: 84 84 82 82  Resp: _0 Temp:   99.4 F (37.4 C) 99.1 F (37.3 C) 99 F (37.2 C)  TempSrc:      SpO2: 100% 100% 100% 100%  Weight:      Height:       Medications  insulin regular, human (MYXREDLIN) 100 units/ 100 mL infusion (5 Units/hr Intravenous Rate/Dose Verify 10/02/20 0615)  dextrose 5 % in lactated ringers infusion (0 mLs Intravenous Hold 10/02/20 0204)  dextrose 50 % solution 0-50 mL (has no administration in time range)  lidocaine-EPINEPHrine (XYLOCAINE W/EPI) 2 %-1:100000 (with pres) injection 20 mL (20 mLs Intradermal Not Given 10/02/20 0354)  diazepam (DIASTAT) rectal kit 10 mg (has no administration in time range)  ketamine HCl 50 MG/5ML SOSY (  Not Given 10/02/20 0356)  dexmedetomidine (PRECEDEX) 200 MCG/50ML (4 mcg/mL) infusion (1 mcg/kg/hr  80.3 kg Intravenous Rate/Dose Change 10/02/20 0635)  propofol (DIPRIVAN) 1000 MG/100ML infusion (55 mcg/kg/min  80.3 kg Intravenous Rate/Dose Change 10/02/20 0632)  midazolam (VERSED) injection 5 mg ( Intravenous Given 10/02/20 0203)  lactated ringers bolus 1,634 mL (0 mL/kg  81.7 kg Intravenous Stopped 10/02/20 0340)  ziprasidone (GEODON) injection 20 mg (20 mg Intramuscular Given 10/02/20 0226)  sterile water (preservative free) injection (1.2 mLs  Given 10/02/20 0227)  diphenhydrAMINE (BENADRYL) 50 MG/ML injection (50 mg  Given 10/02/20 0234)  ketamine (KETALAR) injection 320 mg (320 mg Intramuscular Given 10/02/20 0249)  labetalol (NORMODYNE) injection 20 mg (20 mg Intravenous Given 10/02/20 0411)  diazepam (VALIUM) injection 5 mg (5 mg Intravenous Given 10/02/20 0501)  etomidate (AMIDATE) injection 20 mg (20 mg Intravenous Given 10/02/20 0522)  succinylcholine (ANECTINE) injection 100 mg (100 mg Intravenous Given 10/02/20 0523)  vecuronium (NORCURON) injection 10 mg (10 mg Intravenous Given 10/02/20 0623)  sterile water (preservative free) injection (10 mLs  Given 10/02/20 0623)  succinylcholine (ANECTINE) injection (50 mg Intravenous Given 10/02/20 0537)   3:49 AM Returned  from patient's bedside after prolonged intervention (~85 minutes at bedside).  Patient was given a total of 5 mg Haldol IM (by EMS PTA), 5 mg Versed IM, 20 mg Geodon IM, and 50 mg Benadryl IM.  None of these successive drugs succeeded in calming the patient who continued to be combative and interfering with multiple attempts to start an IV.  We then made the decision to perform emergency procedural sedation using intramuscular ketamine.  The patient was on the monitor and respiratory therapy was standing by should he require airway intervention.  He was given 320 mg IM ketamine.  After 20 minutes adequate sedation was obtained.  I attempted a left femoral central line (see note).  Nursing staff attempted a left external jugular line.  The external jugular was successfully cannulated and treatment for hyperglycemic crisis* was initiated with fluid bolus and IV insulin per [employer].  A Precedex drip at nonintubated doses was ordered for continued sedation.  Labetalol 20 mg was ordered for persistent hypertension and tachycardia.  It is unclear if the tachycardia is due to drug intoxication, consequent excited delirium, dehydration from his hyperglycemia, or combination of these.  [*Addendum: Laboratory results are consistent with a compensated diabetic ketoacidosis, as the patient is ketotic with elevated anion gap and decreased CO2 but normal pH. Also, creatinine is elevated compared with previous studies, consistent with acute kidney injury.]  As patient has a history of two past seizures, and given multiple drugs that could lower the seizure threshold, a Diastat 49m rectal gel kit was ordered to bedside in the event of a seizure in the setting of no IV access.   [Addendum: not given as patient never seized, and IV access was ultimately secured].  6:00 AM The patient could not be kept adequately sedated with Precedex drip.  The decision was made to intubate him.  I attempted rapid sequence intubation using  etomidate and succinylcholine twice without success.  There was vomiting status post intubation/extubation of the esophagus, and consequent insufflation of the stomach, with suspected aspiration.  Because we could not preoxygenate due to the patient refusing to allow a mask on his face, his oxygen saturation rapidly fell, requiring bag-valve-mask ventilation to maintain adequate oxygenation. This resulted in additional distension of the stomach and additional vomiting. A third attempt was made by RMontine Circle PA-C, again without success.  Anesthesia was consulted and Josh, CRNA successfully intubated the patient using succinylcholine only.  Pulmonary/critical care (Laurelyn Sickle MD) was consulted and he will admit the patient.  The patient's wife was contacted by phone and given an update.  6:38 AM Patient resting comfortably on propofol and vecuronium.  Precedex is also still running.  We will leave the decision as to which drip(s) should continue to Dr. EMariane Masters  Blood pressure is 140/80, pulse rate is 86.  Endotracheal tube, placed by CRNA, position verified by myself. Orogastric tube, placed by nursing staff, position verified by myself.  6:52 AM Dr. EMariane Mastersin to see patient.  PROCEDURES  .Sedation  Date/Time: 10/02/2020 2:45 AM Performed by: , , MD Authorized by: , , MD   Consent:    Consent obtained:  Emergent situation Universal protocol:    Immediately prior to procedure, a time out was called: yes     Patient identity confirmed:  Arm band Indications:    Sedation is required to allow for: IV access.   Procedure necessitating sedation performed by:  Physician performing sedation Pre-sedation assessment:    Time since last food or drink:  Emergent situation   NPO status caution: unable to specify NPO status     ASA classification: class 3 - patient with severe systemic disease     Mouth opening:  3 or more finger widths   Mallampati score:  I - soft palate,  uvula, fauces, pillars visible   Neck mobility: normal     Pre-sedation assessments completed and reviewed: airway patency, cardiovascular function, mental status, nausea/vomiting, respiratory function and temperature     Pre-sedation assessment completed:  10/02/2020 2:50 AM Immediate pre-procedure details:    Reassessment: Patient reassessed immediately prior to procedure  Reviewed: vital signs     Verified: bag valve mask available, emergency equipment available, intubation equipment available, oxygen available and suction available   Procedure details (see MAR for exact dosages):    Preoxygenation:  Room air   Sedation:  Ketamine   Intended level of sedation: deep   Analgesia:  None   Intra-procedure monitoring:  Blood pressure monitoring, continuous capnometry, frequent LOC assessments, cardiac monitor, continuous pulse oximetry and frequent vital sign checks   Intra-procedure events: none     Total Provider sedation time (minutes):  30 Post-procedure details:    Post-sedation assessment completed:  10/02/2020 4:30 AM   Attendance: Constant attendance by certified staff until patient recovered     Post-sedation assessments completed and reviewed: airway patency, cardiovascular function, mental status, nausea/vomiting and respiratory function     Procedure completion:  Tolerated well, no immediate complications Comments:     Patient placed on Precedex drip due to reemerging agitation. .Central Line  Date/Time: 10/02/2020 4:15 AM Performed by: , , MD Authorized by: , , MD   Consent:    Consent obtained:  Emergent situation Universal protocol:    Immediately prior to procedure, a time out was called: yes     Patient identity confirmed:  Arm band Pre-procedure details:    Indication(s): central venous access     Hand hygiene: Hand hygiene performed prior to insertion     All elements of maximal sterile barrier technique followed: local drape used in lieu of  full body drape.     Skin preparation agent: Skin preparation agent completely dried prior to procedure   Sedation:    Sedation type:  Deep Anesthesia:    Anesthesia method:  Local infiltration   Local anesthetic:  Lidocaine 1% w/o epi Procedure details:    Location:  L femoral   Site selection rationale:  Emergent need for access   Patient position:  Supine   Procedural supplies:  Triple lumen   Landmarks identified: yes     Ultrasound guidance: no     Number of attempts:  2   Successful placement: no   Post-procedure details:    Post-procedure:  Dressing applied   Procedure completion:  Tolerated Comments:     Procedure terminated due to nursing staff obtaining external jugular IV access. Procedure Name: Intubation Date/Time: 10/02/2020 5:18 AM Performed by: , , MD Pre-anesthesia Checklist: Patient identified, Emergency Drugs available, Suction available, Patient being monitored and Timeout performed Preoxygenation: unable due to agitation, inability to tolerate mask. Induction Type: Rapid sequence Ventilation: Unable to mask ventilate Laryngoscope Size: 3 and Glidescope Grade View: Grade II Tube type: Non-subglottic suction tube Tube size: 7.0 mm Number of attempts: 3 Airway Equipment and Method: Patient positioned with wedge pillow, Video-laryngoscopy and Stylet Post-procedure assessment: not successful; likely aspiration. Dental Injury: Teeth and Oropharynx as per pre-operative assessment  Difficulty Due To: Difficulty was unanticipated and Difficult Airway- due to anterior larynx Future Recommendations: Recommend- induction with short-acting agent, and alternative techniques readily available   CRITICAL CARE Performed by: Karen Chafe  Total critical care time: 120 minutes Critical care time was exclusive of separately billable procedures and treating other patients. Critical care was necessary to treat or prevent imminent or life-threatening  deterioration. Critical care was time spent personally by me on the following activities: development of treatment plan with patient and/or surrogate as well as nursing, discussions with consultants, evaluation of patient's response to treatment, examination of patient, obtaining history from patient or surrogate, ordering and performing treatments  and interventions, ordering and review of laboratory studies, ordering and review of radiographic studies, pulse oximetry and re-evaluation of patient's condition.  ED DIAGNOSES     ICD-10-CM   1. Delirium due to multiple etiologies, acute, hyperactive  F05     2. Hyperglycemia  R73.9     3. Polysubstance abuse ([place])  F19.10     4. Difficult intubation  T88.4XXA DG Chest Port 1 View    DG Chest Port 1 View    5. Encounter for orogastric (OG) tube placement  Z46.59 DG Abd Portable 1V    DG Abd Portable 1V    6. Polythe[place]  Q83.3     7. 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 AKI (acute kidney injury) ([place])  N17.9     9. Encounter for central line placement  Z45.2 DG Chest 1 View    DG Chest 1 View    10. Compensated metabolic acidosis  V78.4         Zykeria Laguardia, Jenny Reichmann, MD 10/02/20 0654    Shanon Rosser, MD 10/02/20 6962    Shanon Rosser, MD 10/02/20 9528    Shanon Rosser, MD 10/02/20 1102    Delena Casebeer, Jenny Reichmann, MD 10/03/20 1225

## 2020-10-02 NOTE — ED Notes (Signed)
Patient continues to vomit despite sedation, paralytics and OG tube.

## 2020-10-02 NOTE — ED Notes (Signed)
Patient fighting precedex drip, fighting restraints, diaphoretic, working against all measures at this moment. Unable to calm down.

## 2020-10-02 NOTE — ED Triage Notes (Signed)
Patient BIB [facility] from home. Patients girlfriend said he has not been himself recently. Patient was shaky, not responding verbally, when asked if patient does drugs he shook his head but did not respond with words. History of heroin and meth usage.    EMS gave '5mg'$  Haloperidol '2mg'$  intranasal Narcan (nothing changed after administration) 2L [place]

## 2020-10-02 NOTE — ED Notes (Addendum)
Patient started screaming, kicking his legs up and down aggressively, making animal sounds. Patient is a danger to himself and others. Punching at staff, security, nurses. Patient is alert to self, knows year, knows he is at the hospital. Will not verbalize  what drugs he took

## 2020-10-02 NOTE — Progress Notes (Signed)
eLink Physician-Brief Progress Note Patient Name: Luke [name] DOB: 11/29/1980 MRN: [id number]   Date of Service  10/02/2020  HPI/Events of Note  Agitation   eICU Interventions  Plan: Increase ceiling on Fentanyl IV infusion to 300 mcg/hour.      Intervention Category Major Interventions: Delirium, psychosis, severe agitation - evaluation and management  [name] 10/02/2020, 8:05 PM

## 2020-10-02 NOTE — ED Notes (Signed)
Patient increasingly agitated. Trying to fight the restraints. 6 IV attempts by 3 RN's unable to obtain access. Patient keeps jerking hands away.

## 2020-10-02 NOTE — Procedures (Signed)
Central Venous Catheter Insertion Procedure Note  Luke [name]  [id number]  1980-07-06  Date:10/02/20  Time:11:10 AM   Provider Performing:Brooks Kinnan V. Elsworth Soho   Procedure: Insertion of Non-tunneled Central Venous [phone]) with US guidance [id number])   Indication(s) Medication administration and Difficult access  Consent Risks of the procedure as well as the alternatives and risks of each were explained to the patient and/or caregiver.  Consent for the procedure was obtained and is signed in the bedside chart  Anesthesia Topical only with 1% lidocaine   Timeout Verified patient identification, verified procedure, site/side was marked, verified correct patient position, special equipment/implants available, medications/allergies/relevant history reviewed, required imaging and test results available.  Sterile Technique Maximal sterile technique including full sterile barrier drape, hand hygiene, sterile gown, sterile gloves, mask, hair covering, sterile ultrasound probe cover (if used).  Procedure Description Area of catheter insertion was cleaned with chlorhexidine and draped in sterile fashion.  With real-time ultrasound guidance a central venous catheter was placed into the right internal jugular vein. Nonpulsatile blood flow and easy flushing noted in all ports.  The catheter was sutured in place and sterile dressing applied.  Complications/Tolerance None; patient tolerated the procedure well. Chest X-ray verified placement for internal jugular position  EBL Minimal  Specimen(s) None  Luke [name] V. Elsworth Soho MD

## 2020-10-02 NOTE — ED Notes (Signed)
Unsuccessful intubations x3. Anesthesia called and on their way.

## 2020-10-02 NOTE — Anesthesia Procedure Notes (Addendum)
Procedure Name: Intubation Date/Time: 10/02/2020 5:55 AM Performed by: Montel Clock, CRNA Pre-anesthesia Checklist: Patient identified, Emergency Drugs available, Suction available, Patient being monitored and Timeout performed Patient Re-evaluated:Patient Re-evaluated prior to induction Oxygen Delivery Method: Circle system utilized Preoxygenation: Pre-oxygenation with 100% oxygen Induction Type: IV induction and Rapid sequence Ventilation: Mask ventilation without difficulty Laryngoscope Size: Glidescope and 4 Grade View: Grade II Tube type: Oral Tube size: 7.5 mm Number of attempts: 1 Airway Equipment and Method: Rigid stylet and Video-laryngoscopy Placement Confirmation: ETT inserted through vocal cords under direct vision, positive ETCO2 and breath sounds checked- equal and bilateral Secured at: 23 cm Tube secured with: Tape Dental Injury: Teeth and Oropharynx as per pre-operative assessment  Comments: Prior attempt in ER unsuccessful.

## 2020-10-02 NOTE — ED Notes (Signed)
Pharmacy called for more precedex.

## 2020-10-02 NOTE — Progress Notes (Addendum)
  Admitted with severe psychomotor agitation requiring intubation Difficult airway, complicated by aspiration pneumonia. UDS positive for amphetamines, BZD and THC Found to be in DKA  Transferred to ICU, intermittent breakthrough agitation on propofol and Precedex Soft blood pressure, bilateral air entry present, no accessory muscle use, tachypneic, S1-S2 tacky  Central venous access was obtained  Labs reviewed. Chest x-ray shows ET tube in position, right IJ in position, bilateral airspace disease consistent with aspiration  Impression/plan DKA -anion gap resolved, switch to Levemir 12 units every 12 and resistant scale SSI  AKI -appears dry -1 L LR bolus given -Change fluids to LR  Acute respiratory failure -vent settings reviewed and adjusted Psychomotor agitation related to substance abuse likely amphetamines -using propofol and Precedex with Versed for breakthrough, use fentanyl drip, goal RASS -1  Aspiration pneumonia -add Unasyn and send respiratory culture  Additional critical care time independent of procedures  x 9m Luke Nordahl V. AElsworth SohoMD

## 2020-10-02 NOTE — ED Notes (Signed)
Anesthesia is here.

## 2020-10-02 NOTE — ED Notes (Signed)
Emergent procedural sedation per Dr. Florina Ou to get IV access for agitated delirium. Time out performed. Respiratory at bedside. Dr. Florina Ou at bedside. Suction set up, cardiac monitoring performed.

## 2020-10-02 NOTE — H&P (Addendum)
NAME:  Luke [name], MRN:  [id number], DOB:  08-15-1980, LOS: 0 ADMISSION DATE:  10/02/2020, CONSULTATION DATE: 10/02/2020 REFERRING MD: Dr. Florina Ou, CHIEF COMPLAINT: Agitation and combativeness requiring intubation  History of Present Illness:  Patient is a 40 year old brought to the emergency room extremely agitated by patient's girlfriend.  He was extremely uncooperative agitated and threatening to staff.  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  Attempts to sedate the patient in the emergency room included IM Haldol, Versed, Geodon, Benadryl and ketamine prior to intubation.  Sedation was hampered due to IV access.  Intubation was difficult requiring anesthesia.  It is unknown what his drug ingestion might have been.  Urine drug screen was positive for amphetamines benzodiazepine and THC.  Patient did vomit during initial intubation attempts.   He also on this admission is noted to have a creatinine above his baseline now at 1.93 previously 1.5.  Some elevation in his baseline creatinine may be due to dehydration and elevated glucose.  So appears to be in DKA.  May have artificially elevated that value.  Post intubation blood gas looked normal.  His anion gap is 21.CPK in the emergency room was 666.  Glucose is 823.  Patient's endocarditis was felt secondary to Streptococcus mitis for which he was treated for a 3 weeks with ampicillin and gentamicin then off label with oritavancin.  He has had a previous ER admission for congestive heart failure secondary to his mitral regurgitation.  Last echocardiogram ejection fraction is 70-75%.   Pertinent  Medical History    Anemia     Anxiety  Dx 2008   Cellulitis     Chronic kidney disease     Diabetes mellitus without complication ([place]) Dx 6160   DKA (diabetic ketoacidoses) 07/21/2017   Endocarditis mitral 04/2019   Endocarditis     GERD (gastroesophageal reflux disease) Dx  2008   Headache(784.0)     Heart murmur     Hepatitis C     Hypertension     Hypothyroidism     MRSA infection     Pneumonia     Seizures ([place]) 2011    x 2 in lifetime. on Dilantin for a while.   Sepsis ([place])     Substance abuse ([place]) 2013    heroin use, multiple relapses   Type 1 diabetes ([place])       Significant Hospital Events: Including procedures, antibiotic start and stop dates in addition to other pertinent events   Intubation and admission 10/02/2020  Interim History / Subjective:  NA  Objective   Blood pressure (!) 174/108, pulse (!) 108, temperature 99.5 F (37.5 C), temperature source Oral, resp. rate (!) 26, height $RemoveBe'5\' 9"'vRsMynaii$  (1.753 m), weight 80.3 kg, SpO2 98 %.        Intake/Output Summary (Last 24 hours) at 10/02/2020 0600 Last data filed at 10/02/2020 0340 Gross per 24 hour  Intake 66.6 ml  Output --  Net 66.6 ml   Filed Weights   10/02/20 0137 10/02/20 0233  Weight: 81.7 kg 80.3 kg    Examination: General: Sedated intubated male HENT: Within normal limits Lungs: Clear bilaterally Cardiovascular: Regular rate and rhythm, 5/6 stock ejection murmur that radiates into the right mid axillary line Abdomen: Benign bowel sounds positive Extremities: Upper extremities with scarring over dorsal aspect of both hands and on the antecubital aspect of both arms to the level of the shoulder Neuro: Sedated  and intubated GU: Within normal limits  Resolved Hospital Problem list   NA  Assessment & Plan:  1.  Patient intubated due to severe combativeness presumably secondary to unknown drug ingestion: Continue propofol for sedation for now  2.  diabetic ketoacidosis: We will treat with volume expansion and IV insulin  3.  Stage III chronic kidney disease with elevation in his creatinine possibly secondary to dehydration possibly secondary to DKA: We will continue to monitor with volume expansion  4.  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  Hepatitis C  6.  History of heroin and methamphetamine IV drug abuse  7.  Probable aspiration at time of intubation:  empiric Unasyn   Best Practice (right click and "Reselect all SmartList Selections" daily)   Diet/type: NPO DVT prophylaxis: LMWH GI prophylaxis: H2B Lines: N/A Foley:  N/A Code Status:  full code Last date of multidisciplinary goals of care discussion [patient intubated and sedated]  Labs   CBC: Recent Labs  Lab 10/02/20 0240  WBC 12.7*  NEUTROABS 9.6*  HGB 13.4  HCT 38.9*  MCV 83.7  PLT 263    Basic Metabolic Panel: Recent Labs  Lab 10/02/20 0240  NA 133*  K 4.9  CL 95*  CO2 17*  GLUCOSE 823*  BUN 28*  CREATININE 1.93*  CALCIUM 9.7   GFR: Estimated Creatinine Clearance: 50.9 mL/min (A) (by C-G formula based on SCr of 1.93 mg/dL (H)). Recent Labs  Lab 10/02/20 0240  WBC 12.7*    Liver Function Tests: No results for input(s): AST, ALT, ALKPHOS, BILITOT, PROT, ALBUMIN in the last 168 hours. No results for input(s): LIPASE, AMYLASE in the last 168 hours. No results for input(s): AMMONIA in the last 168 hours.  ABG    Component Value Date/Time   PHART 7.372 10/02/2020 0357   PCO2ART 32.1 10/02/2020 0357   PO2ART 111 (H) 10/02/2020 0357   HCO3 18.1 (L) 10/02/2020 0357   TCO2 17 (L) 03/19/2019 0042   ACIDBASEDEF 5.6 (H) 10/02/2020 0357   O2SAT 97.5 10/02/2020 0357     Coagulation Profile: No results for input(s): INR, PROTIME in the last 168 hours.  Cardiac Enzymes: Recent Labs  Lab 10/02/20 0240  CKTOTAL 666*    HbA1C: Hemoglobin A1C  Date/Time Value Ref Range Status  04/11/2014 12:54 AM 11.3 (H) 4.2 - 6.3 % Final    Comment:    The American Diabetes Association recommends that a primary goal of therapy should be <7% and that physicians should reevaluate the treatment regimen in patients with HbA1c values consistently >8%.     HbA1c POC (<> result, manual entry)  Date/Time Value Ref Range Status  03/24/2018 09:45 AM >15 4.0 - 5.6 % Final   Hgb A1c MFr Bld  Date/Time Value Ref Range Status  03/04/2020 03:44 PM 11.6 (H) 4.8 - 5.6 % Final    Comment:             Prediabetes: 5.7 - 6.4          Diabetes: >6.4          Glycemic control for adults with diabetes: <7.0   08/27/2019 09:42 PM 10.8 (H) 4.8 - 5.6 % Final    Comment:    (NOTE) Pre diabetes:          5.7%-6.4%  Diabetes:              >6.4%  Glycemic control for   <7.0% adults with diabetes     CBG: Recent Labs  Lab 10/02/20 0157 10/02/20 0420 10/02/20 0454 10/02/20 0530  GLUCAP >600* >600* 446* 397*    Review of Systems:   Unable to obtain due to intubation and sedation  Past Medical History:  He,  has a past medical history of Anemia, Anxiety ( Dx 2008), Cellulitis, Chronic kidney disease, Diabetes mellitus without complication ([place]) (Dx 9323), DKA (diabetic ketoacidoses) (07/21/2017), Endocarditis (04/2019), Endocarditis, GERD (gastroesophageal reflux disease) (Dx 2008), Headache(784.0), Heart murmur, Hepatitis C, Hypertension, Hypothyroidism, MRSA infection, Pneumonia, Seizures ([place]) (2011 ), Sepsis ([place]), Substance abuse ([place]) (2013 ), and Type 1 diabetes ([place]).   Surgical History:   Past Surgical History:  Procedure Laterality Date   AMPUTATION Left 09/05/2018   Procedure: LEFT GREAT TOE AMPUTATION;  Surgeon: [name], MD;  Location: [facility];  Service: Orthopedics;  Laterality: Left;   APPLICATION OF A-CELL OF EXTREMITY Bilateral 09/01/2019   Procedure: APPLICATION OF MESHED PRIMATRIX AG OF EXTREMITY;  Surgeon: Cindra Presume, MD;  Location: [facility];  Service: Plastics;  Laterality: Bilateral;   I & D EXTREMITY Left 10/11/2012   Procedure: IRRIGATION AND DEBRIDEMENT ABSCESS FOREARM;  Surgeon: Linna Hoff, MD;  Location: [facility];  Service: Orthopedics;  Laterality: Left;   I & D EXTREMITY Left 10/12/2012   Procedure: IRRIGATION  AND DEBRIDEMENT FOREARM;  Surgeon: Linna Hoff, MD;  Location: [facility];  Service: Orthopedics;  Laterality: Left;   I & D EXTREMITY Left 10/14/2012   Procedure: incision and drainage left forearm;  Surgeon: Linna Hoff, MD;  Location: [facility];  Service: Orthopedics;  Laterality: Left;   I & D EXTREMITY Left 10/16/2012   Procedure: IRRIGATION AND DEBRIDEMENT LEFT FOREARM;  Surgeon: Linna Hoff, MD;  Location: [facility];  Service: Orthopedics;  Laterality: Left;   I & D EXTREMITY Left 10/20/2012   Procedure: INCISION AND DRAINAGE AND DEBRIDEMENT LEFT  FOREARM;  Surgeon: Linna Hoff, MD;  Location: [facility];  Service: Orthopedics;  Laterality: Left;   I & D EXTREMITY Right 05/22/2019   Procedure: IRRIGATION AND DEBRIDEMENT RIGHT ARM;  Surgeon: [name], MD;  Location: [facility];  Service: Orthopedics;  Laterality: Right;   INCISION AND DRAINAGE OF WOUND Bilateral 11/29/2017   Procedure: DEBRIDEMENT BILATERAL FEET, DEBRIDEMENT LEFT ANKLE, AND APPLY WOUND VAC;  Surgeon: [name], MD;  Location: [facility];  Service: Orthopedics;  Laterality: Bilateral;   INCISION AND DRAINAGE OF WOUND Bilateral 09/01/2019   Procedure: IRRIGATION AND DEBRIDEMENT FOREARM WOUNDS;  Surgeon: Cindra Presume, MD;  Location: [facility];  Service: Plastics;  Laterality: Bilateral;   IRRIGATION AND DEBRIDEMENT ABSCESS     Hx: of left arm abscess related to drug use    TEE WITHOUT CARDIOVERSION N/A 11/28/2017   Procedure: TRANSESOPHAGEAL ECHOCARDIOGRAM (TEE);  Surgeon: Sanda Klein, MD;  Location: [facility];  Service: Cardiovascular;  Laterality: N/A;   TEE WITHOUT CARDIOVERSION N/A 05/12/2019   Procedure: TRANSESOPHAGEAL ECHOCARDIOGRAM (TEE);  Surgeon: Skeet Latch, MD;  Location: [facility];  Service: Cardiovascular;  Laterality: N/A;     Social History:   reports that he has been smoking cigarettes. He has a 25.00 pack-year smoking history. He has never used smokeless tobacco. He reports previous drug use. Drugs:  Cocaine, IV, and Heroin. He reports that he does not drink alcohol.   Family History:  His family history includes Diabetes in his father; Heart disease in his father; Mental  illness in his sister. There is no history of Cancer.   Allergies No Known Allergies   Home Medications  Prior to Admission medications   Medication Sig Start Date End Date Taking? Authorizing Provider  Accu-Chek Softclix Lancets lancets USE TO CHECK BLOOD SUGAR THREE TIMES DAILY E11.65 01/14/20 01/13/21 Yes Charlott Rakes, MD  Blood Glucose Monitoring Suppl (ACCU-CHEK GUIDE ME) w/Device KIT Use to check blood sugar TID. E11.65 01/14/20  Yes Newlin, Enobong, MD  Blood Glucose Monitoring Suppl (ACCU-CHEK GUIDE) w/Device KIT USE TO CHECK BLOOD SUGAR THREE TIMES DAILY 01/14/20 01/13/21 Yes Newlin, Enobong, MD  glucose blood test strip USE TO CHECK BLOOD SUGAR THREE TIMES DAILY E11.65 01/14/20 01/13/21 Yes Newlin, Charlane Ferretti, MD  insulin glargine (LANTUS SOLOSTAR) 100 UNIT/ML Solostar Pen Inject 40 Units into the skin 2 (two) times daily. 06/23/20  Yes Newlin, Charlane Ferretti, MD  Insulin Pen Needle (TRUEPLUS 5-BEVEL PEN NEEDLES) 32G X 4 MM MISC Use as instructed to inject insulin. 11/27/19  Yes Charlott Rakes, MD  Insulin Pen Needle 31G X 5 MM MISC USE AS INSTRUCTED TO INJECT INSULIN. 11/27/19 11/26/20 Yes Newlin, Charlane Ferretti, MD  SUBOXONE 8-2 MG FILM SMARTSIG:1 Strip(s) By Mouth 3 Times Daily 07/14/20   [provider]     Critical care time: Over 45 minutes was spent in bedside evaluation chart review and critical care planning

## 2020-10-02 NOTE — ED Notes (Signed)
Unable to get IV access. Dr. Florina Ou at bedside to perform an emergent central line.

## 2020-10-03 ENCOUNTER — Inpatient Hospital Stay (HOSPITAL_COMMUNITY): Payer: Medicaid Other

## 2020-10-03 DIAGNOSIS — F1910 Other psychoactive substance abuse, uncomplicated: Secondary | ICD-10-CM

## 2020-10-03 DIAGNOSIS — E0810 Diabetes mellitus due to underlying condition with ketoacidosis without coma: Secondary | ICD-10-CM

## 2020-10-03 LAB — GLUCOSE, CAPILLARY
Glucose-Capillary: 181 mg/dL — ABNORMAL HIGH (ref 70–99)
Glucose-Capillary: 53 mg/dL — ABNORMAL LOW (ref 70–99)
Glucose-Capillary: 102 mg/dL — ABNORMAL HIGH (ref 70–99)
Glucose-Capillary: 115 mg/dL — ABNORMAL HIGH (ref 70–99)
Glucose-Capillary: 120 mg/dL — ABNORMAL HIGH (ref 70–99)
Glucose-Capillary: 103 mg/dL — ABNORMAL HIGH (ref 70–99)
Glucose-Capillary: 117 mg/dL — ABNORMAL HIGH (ref 70–99)
Glucose-Capillary: 172 mg/dL — ABNORMAL HIGH (ref 70–99)

## 2020-10-03 LAB — BASIC METABOLIC PANEL
Anion gap: 6 (ref 5–15)
Anion gap: 5 (ref 5–15)
Anion gap: 5 (ref 5–15)
BUN: 26 mg/dL — ABNORMAL HIGH (ref 6–20)
BUN: 20 mg/dL (ref 6–20)
BUN: 17 mg/dL (ref 6–20)
CO2: 26 mmol/L (ref 22–32)
CO2: 26 mmol/L (ref 22–32)
CO2: 26 mmol/L (ref 22–32)
Calcium: 8.3 mg/dL — ABNORMAL LOW (ref 8.9–10.3)
Calcium: 8.2 mg/dL — ABNORMAL LOW (ref 8.9–10.3)
Calcium: 8.3 mg/dL — ABNORMAL LOW (ref 8.9–10.3)
Chloride: 111 mmol/L (ref 98–111)
Chloride: 111 mmol/L (ref 98–111)
Chloride: 109 mmol/L (ref 98–111)
Creatinine, Ser: 1.34 mg/dL — ABNORMAL HIGH (ref 0.61–1.24)
Creatinine, Ser: 1.28 mg/dL — ABNORMAL HIGH (ref 0.61–1.24)
Creatinine, Ser: 1.15 mg/dL (ref 0.61–1.24)
GFR, Estimated: >60 mL/min (ref >60)
GFR, Estimated: >60 mL/min (ref >60)
GFR, Estimated: >60 mL/min (ref >60)
Glucose, Bld: 65 mg/dL — ABNORMAL LOW (ref 70–99)
Glucose, Bld: 102 mg/dL — ABNORMAL HIGH (ref 70–99)
Glucose, Bld: 99 mg/dL (ref 70–99)
Potassium: 3.5 mmol/L (ref 3.5–5.1)
Potassium: 3.5 mmol/L (ref 3.5–5.1)
Potassium: 3.4 mmol/L — ABNORMAL LOW (ref 3.5–5.1)
Sodium: 143 mmol/L (ref 135–145)
Sodium: 142 mmol/L (ref 135–145)
Sodium: 140 mmol/L (ref 135–145)

## 2020-10-03 LAB — CBC
HCT: UNDETERMINED % (ref 39.0–52.0)
HCT: 29.7 % — ABNORMAL LOW (ref 39.0–52.0)
Hemoglobin: UNDETERMINED g/dL (ref 13.0–17.0)
Hemoglobin: 10 g/dL — ABNORMAL LOW (ref 13.0–17.0)
MCH: UNDETERMINED pg (ref 26.0–34.0)
MCH: 28.8 pg (ref 26.0–34.0)
MCHC: UNDETERMINED g/dL (ref 30.0–36.0)
MCHC: 33.7 g/dL (ref 30.0–36.0)
MCV: UNDETERMINED fL (ref 80.0–100.0)
MCV: 85.6 fL (ref 80.0–100.0)
Platelets: UNDETERMINED 10*3/uL (ref 150–400)
Platelets: 272 10*3/uL (ref 150–400)
RBC: UNDETERMINED MIL/uL (ref 4.22–5.81)
RBC: 3.47 MIL/uL — ABNORMAL LOW (ref 4.22–5.81)
RDW: UNDETERMINED % (ref 11.5–15.5)
RDW: 13.3 % (ref 11.5–15.5)
WBC: UNDETERMINED 10*3/uL (ref 4.0–10.5)
WBC: 13.9 10*3/uL — ABNORMAL HIGH (ref 4.0–10.5)
nRBC: UNDETERMINED % (ref 0.0–0.2)
nRBC: 0 % (ref 0.0–0.2)

## 2020-10-03 LAB — MAGNESIUM: Magnesium: 1.9 mg/dL (ref 1.7–2.4)

## 2020-10-03 LAB — PHOSPHORUS: Phosphorus: 4.2 mg/dL (ref 2.5–4.6)

## 2020-10-03 MED ORDER — INSULIN DETEMIR 100 UNIT/ML ~~LOC~~ SOLN
6.0000 [IU] | Freq: Two times a day (BID) | SUBCUTANEOUS | Status: DC
  Administered 2020-10-03 – 2020-10-04 (×4): 6 [IU] via SUBCUTANEOUS
  Filled 2020-10-03 (×4): qty 0.06

## 2020-10-03 MED ORDER — OSMOLITE 1.5 CAL PO LIQD
1000.0000 mL | ORAL | Status: DC
  Administered 2020-10-03 – 2020-10-05 (×3): 1000 mL
  Filled 2020-10-03 (×4): qty 1000

## 2020-10-03 MED ORDER — VITAL HIGH PROTEIN PO LIQD: 1000.0000 mL | ORAL | Status: DC

## 2020-10-03 MED ORDER — DEXTROSE 10 % IV SOLN: INTRAVENOUS | Status: DC

## 2020-10-03 MED ORDER — PROSOURCE TF PO LIQD
45.0000 mL | Freq: Every day | ORAL | Status: DC
  Administered 2020-10-03 – 2020-10-07 (×5): 45 mL
  Filled 2020-10-03 (×5): qty 45

## 2020-10-03 MED ORDER — FREE WATER
100.0000 mL | Status: DC
  Administered 2020-10-03 – 2020-10-06 (×17): 100 mL

## 2020-10-03 NOTE — Progress Notes (Signed)
Unable to titrate down on sedation for wake-up assessment due to agitation.  Fent @ 150 Prop @ 30  Precedex @ 1.2 Will continue to monitor.

## 2020-10-03 NOTE — Progress Notes (Addendum)
eLink Physician-Brief Progress Note Patient Name: Luke [name] DOB: 08-22-1980 MRN: [id number]   Date of Service  10/03/2020  HPI/Events of Note  Hypoglycemia - Blood glucose = 53. Already given D50.  Patient NPO and on Levemir and Novolog SSI.  eICU Interventions  Plan: D10W to run IV at 50 mL/hour. Decrease Levemir dose to 6 units [place] Q 12 hours.     Intervention Category Major Interventions: Other:  Lysle Dingwall 10/03/2020, 3:58 AM

## 2020-10-03 NOTE — Progress Notes (Signed)
Hypoglycemic Event  CBG: 53   Treatment: D50 50 mL (25 gm)  Symptoms: None  Follow-up CBG: Time:0346  CBG Result:181   Possible Reasons for Event: Inadequate meal intake  Comments/MD notified:Elink notified    Luke [name]

## 2020-10-03 NOTE — Progress Notes (Addendum)
Initial Nutrition Assessment  DOCUMENTATION CODES:   Not applicable  INTERVENTION:  - will order TF regimen: Osmolite 1.5 @ 60 ml/hr with 45 ml Prosource TF once/day and 100 ml free water every 4 hours. - this regimen + kcal from current propofol rate will provide 2583 kcal, 101 grams protein, and 1697 ml free water.  NUTRITION DIAGNOSIS:   Inadequate oral intake related to inability to eat as evidenced by NPO status.  GOAL:   Patient will meet greater than or equal to 90% of their needs  MONITOR:   Vent status, TF tolerance, Labs, Weight trends  REASON FOR ASSESSMENT:   Ventilator, Consult Enteral/tube feeding initiation and management  ASSESSMENT:   40 year old male with medical history of anxiety, anemia, GERD, seizures, substance use/abuse (heroin and methamphetamines), HTN, CKD, type 1 DM, hypothyroidism, endocarditis, heart murmur, hepatitis C, and hx of necrotizing fasciitis in multiple sites. His girlfriend brought him to the ED d/t extreme agitation. He was intubated in the ED; difficult to intubate and Anesthesia performed. UDS positive for amphetamines, benzos, and THC. He vomited during initial intubation attempts.  Able to talk with RN at bedside. CCM ok with starting TF via OGT today.   Patient remains intubated with OGT to LIS with 150 ml output this shift. OGT noted to be gastric.   No family/visitors present at bedside.  Weight today is 174 lb, weight yesterday was 177 lb, and weight on 5/3 was 180 lb. This indicates 6 lb weight loss (3.3% body weight) in the past 2.5 months; not significant for time frame.  Per notes: - polysubstance abuse - agitation--no plan for extubation today - aspiration pneumonitis    Patient is currently intubated on ventilator support MV: 10.6 L/min Temp (24hrs), Avg:99.8 F (37.7 C), Min:97.6 F (36.4 C), Max:101.12 F (38.4 C) Propofol: 14.5 ml/hr (383 kcal/24 hrs) BP: 136/85 and MAP: 100  Labs reviewed; CBGs: 53-181  mg/dl, creatinine: 1.28 mg/dl, Ca: 8.2 mg/dl. Medications reviewed; 20 mg IV pepcid BID, sliding scale novolog, 6 units levemir BID.  Drips; fentanyl @ 175 mcg/hr, propofol @ 30 mcg/kg/min, precedex @ 1.2 mcg/hr. IVF; D10 @ 50 ml/hr (408 kcal/24 hrs).    NUTRITION - FOCUSED PHYSICAL EXAM:  Completed; no muscle or fat depletions, mild edema to BUE.   Diet Order:   Diet Order             Diet NPO time specified  Diet effective now                   EDUCATION NEEDS:   No education needs have been identified at this time  Skin:  Skin Assessment: Reviewed RN Assessment  Last BM:  7/17 (?)  Height:   Ht Readings from Last 1 Encounters:  10/03/20 '5\' 9"'$  (1.753 m)    Weight:   Wt Readings from Last 1 Encounters:  10/03/20 78.9 kg     Estimated Nutritional Needs:  Kcal:  2154 kcal Protein:  95-118 grams Fluid:  >/= 2.1 L/day      Jarome Matin, MS, RD, LDN, CNSC Inpatient Clinical Dietitian RD pager # available in [facility]  After hours/weekend pager # available in [facility]

## 2020-10-03 NOTE — Progress Notes (Signed)
NAME:  Luke [name], MRN:  [id number], DOB:  1980-10-12, LOS: 1 ADMISSION DATE:  10/02/2020, CONSULTATION DATE: 10/02/2020 REFERRING MD: Dr. Florina Ou, CHIEF COMPLAINT: Agitation and combativeness requiring intubation  History of Present Illness:  Patient is a 40 year old brought to the emergency room extremely agitated by patient's girlfriend.  He was extremely uncooperative agitated and threatening to staff.  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  Attempts to sedate the patient in the emergency room included IM Haldol, Versed, Geodon, Benadryl and ketamine prior to intubation.  Sedation was hampered due to IV access.  Intubation was difficult requiring anesthesia.  It is unknown what his drug ingestion might have been.  Urine drug screen was positive for amphetamines benzodiazepine and THC.  Patient did vomit during initial intubation attempts.   He also on this admission is noted to have a creatinine above his baseline now at 1.93 previously 1.5.  Some elevation in his baseline creatinine may be due to dehydration and elevated glucose.  So appears to be in DKA.  May have artificially elevated that value.  Post intubation blood gas looked normal.  His anion gap is 21.CPK in the emergency room was 666.  Glucose is 823.  Patient's endocarditis was felt secondary to Streptococcus mitis for which he was treated for a 3 weeks with ampicillin and gentamicin then off label with oritavancin.  He has had a previous ER admission for congestive heart failure secondary to his mitral regurgitation.  Last echocardiogram ejection fraction is 70-75%.   Pertinent  Medical History    Anemia     Anxiety  Dx 2008   Cellulitis     Chronic kidney disease     Diabetes mellitus without complication ([place]) Dx 5520   DKA (diabetic ketoacidoses) 07/21/2017   Endocarditis mitral 04/2019   Endocarditis     GERD (gastroesophageal reflux disease) Dx  2008   Headache(784.0)     Heart murmur     Hepatitis C     Hypertension     Hypothyroidism     MRSA infection     Pneumonia     Seizures ([place]) 2011    x 2 in lifetime. on Dilantin for a while.   Sepsis ([place])     Substance abuse ([place]) 2013    heroin use, multiple relapses   Type 1 diabetes ([place])       Significant Hospital Events: Including procedures, antibiotic start and stop dates in addition to other pertinent events   Intubation and admission 10/02/2020 Stable, still with significant agitation  Interim History / Subjective:  No overnight event Severe agitation with attempts at decreasing sedating medications  Objective   Blood pressure 120/77, pulse 84, temperature 98.3 F (36.8 C), temperature source Oral, resp. rate (!) 22, height _0  (1.753 m), weight 78.9 kg, SpO2 100 %.    Vent Mode: PRVC FiO2 (%):  [40 %-50 %] 40 % Set Rate:  [22 bmp] 22 bmp Vt Set:  [420 mL] 420 mL PEEP:  [5 cmH20] 5 cmH20 Plateau Pressure:  [12 cmH20-15 cmH20] 14 cmH20   Intake/Output Summary (Last 24 hours) at 10/03/2020 0911 Last data filed at 10/03/2020 0600 Gross per 24 hour  Intake 3663.74 ml  Output 1335 ml  Net 2328.74 ml   Filed Weights   10/02/20 0233 10/02/20 0831 10/03/20 0214  Weight: 80.3 kg 78.8 kg 78.9 kg    Examination: General: sedated, appears comfortable HENT:  moist oral mucosa, endotracheal tube in place Lungs: clear breath sounds, decreased air movement at bases Cardiovascular: S1S2,ESM  Abdomen: BS appreciated Extremities: bilateral upper extremity scarring Neuro: sedated GU: fair output  Resolved Hospital Problem list   NA  Assessment & Plan:  Polysubstance abuse Agitation -continue sedation -assess periodically and wean as tolerated  3. DKA -continue insulin - volume resuscitation  4. CKD stage 3  5.  Hepatitis C  6.  History of heroin and methamphetamine-drug abuse  7.  History of mitral valve endocarditis with regurgitation -Treated  February 2021   8.  Aspiration pneumonitis -Continue Unasyn  Not ready for weaning as he still gets very agitated  Best Practice (right click and "Reselect all SmartList Selections" daily)   Diet/type: NPO DVT prophylaxis: LMWH GI prophylaxis: H2B Lines: N/A Foley:  N/A Code Status:  full code Last date of multidisciplinary goals of care discussion [patient intubated and sedated]  Labs   CBC: Recent Labs  Lab 10/02/20 0240 10/03/20 0238  WBC 12.7* 13.7*  NEUTROABS 9.6*  --   HGB 13.4 9.9*  HCT 38.9* 28.5*  MCV 83.7 84.8  PLT 376 977    Basic Metabolic Panel: Recent Labs  Lab 10/02/20 0240 10/02/20 1006 10/02/20 1810 10/03/20 0238  NA 133* 143 141 143  K 4.9 3.7 3.6 3.5  CL 95* 110 110 111  CO2 17* _0 GLUCOSE 823* 112* 165* 65*  BUN 28* 26* 27* 26*  CREATININE 1.93* 1.48* 1.55* 1.34*  CALCIUM 9.7 8.7* 8.3* 8.3*  MG  --   --   --  1.9  PHOS  --   --   --  4.2   GFR: Estimated Creatinine Clearance: 73.3 mL/min (A) (by C-G formula based on SCr of 1.34 mg/dL (H)). Recent Labs  Lab 10/02/20 0240 10/03/20 0238  WBC 12.7* 13.7*    Liver Function Tests: No results for input(s): AST, ALT, ALKPHOS, BILITOT, PROT, ALBUMIN in the last 168 hours. No results for input(s): LIPASE, AMYLASE in the last 168 hours. No results for input(s): AMMONIA in the last 168 hours.  ABG    Component Value Date/Time   PHART 7.345 (L) 10/02/2020 0850   PCO2ART 44.1 10/02/2020 0850   PO2ART 91.4 10/02/2020 0850   HCO3 23.4 10/02/2020 0850   TCO2 17 (L) 03/19/2019 0042   ACIDBASEDEF 1.8 10/02/2020 0850   O2SAT 96.4 10/02/2020 0850     Coagulation Profile: No results for input(s): INR, PROTIME in the last 168 hours.  Cardiac Enzymes: Recent Labs  Lab 10/02/20 0240  CKTOTAL 666*    HbA1C: Hemoglobin A1C  Date/Time Value Ref Range Status  04/11/2014 12:54 AM 11.3 (H) 4.2 - 6.3 % Final    Comment:    The American Diabetes Association recommends that a primary  goal of therapy should be <7% and that physicians should reevaluate the treatment regimen in patients with HbA1c values consistently >8%.    HbA1c POC (<> result, manual entry)  Date/Time Value Ref Range Status  03/24/2018 09:45 AM >15 4.0 - 5.6 % Final   Hgb A1c MFr Bld  Date/Time Value Ref Range Status  03/04/2020 03:44 PM 11.6 (H) 4.8 - 5.6 % Final    Comment:             Prediabetes: 5.7 - 6.4          Diabetes: >6.4          Glycemic control for adults with diabetes: <7.0   08/27/2019  09:42 PM 10.8 (H) 4.8 - 5.6 % Final    Comment:    (NOTE) Pre diabetes:          5.7%-6.4%  Diabetes:              >6.4%  Glycemic control for   <7.0% adults with diabetes     CBG: Recent Labs  Lab 10/02/20 2022 10/02/20 2328 10/03/20 0332 10/03/20 0346 10/03/20 0725  GLUCAP 155* 118* 53* 181* 102*    Review of Systems:   Unable to obtain due to intubation and sedation  Past Medical History:  He,  has a past medical history of Anemia, Anxiety ( Dx 2008), Cellulitis, Chronic kidney disease, Diabetes mellitus without complication ([place]) (Dx 9242), DKA (diabetic ketoacidoses) (07/21/2017), Endocarditis (04/2019), Endocarditis, GERD (gastroesophageal reflux disease) (Dx 2008), Headache(784.0), Heart murmur, Hepatitis C, Hypertension, Hypothyroidism, MRSA infection, Necrotizing fasciitis of multiple sites [facility]), Pneumonia, Seizures ([place]) (2011), Sepsis ([place]), Substance abuse ([place]) (2013), and Type 1 diabetes ([place]).   Surgical History:   Past Surgical History:  Procedure Laterality Date   AMPUTATION Left 09/05/2018   Procedure: LEFT GREAT TOE AMPUTATION;  Surgeon: [name], MD;  Location: [place];  Service: Orthopedics;  Laterality: Left;   APPLICATION OF A-CELL OF EXTREMITY Bilateral 09/01/2019   Procedure: APPLICATION OF MESHED PRIMATRIX AG OF EXTREMITY;  Surgeon: Cindra Presume, MD;  Location: [place];  Service: Plastics;  Laterality: Bilateral;   I & D EXTREMITY Left 10/11/2012    Procedure: IRRIGATION AND DEBRIDEMENT ABSCESS FOREARM;  Surgeon: Linna Hoff, MD;  Location: [place];  Service: Orthopedics;  Laterality: Left;   I & D EXTREMITY Left 10/12/2012   Procedure: IRRIGATION AND DEBRIDEMENT FOREARM;  Surgeon: Linna Hoff, MD;  Location: [place];  Service: Orthopedics;  Laterality: Left;   I & D EXTREMITY Left 10/14/2012   Procedure: incision and drainage left forearm;  Surgeon: Linna Hoff, MD;  Location: [place];  Service: Orthopedics;  Laterality: Left;   I & D EXTREMITY Left 10/16/2012   Procedure: IRRIGATION AND DEBRIDEMENT LEFT FOREARM;  Surgeon: Linna Hoff, MD;  Location: [place];  Service: Orthopedics;  Laterality: Left;   I & D EXTREMITY Left 10/20/2012   Procedure: INCISION AND DRAINAGE AND DEBRIDEMENT LEFT  FOREARM;  Surgeon: Linna Hoff, MD;  Location: [place];  Service: Orthopedics;  Laterality: Left;   I & D EXTREMITY Right 05/22/2019   Procedure: IRRIGATION AND DEBRIDEMENT RIGHT ARM;  Surgeon: [name], MD;  Location: [place];  Service: Orthopedics;  Laterality: Right;   INCISION AND DRAINAGE OF WOUND Bilateral 11/29/2017   Procedure: DEBRIDEMENT BILATERAL FEET, DEBRIDEMENT LEFT ANKLE, AND APPLY WOUND VAC;  Surgeon: [name], MD;  Location: [place];  Service: Orthopedics;  Laterality: Bilateral;   INCISION AND DRAINAGE OF WOUND Bilateral 09/01/2019   Procedure: IRRIGATION AND DEBRIDEMENT FOREARM WOUNDS;  Surgeon: Cindra Presume, MD;  Location: [place];  Service: Plastics;  Laterality: Bilateral;   IRRIGATION AND DEBRIDEMENT ABSCESS     Hx: of left arm abscess related to drug use    TEE WITHOUT CARDIOVERSION N/A 11/28/2017   Procedure: TRANSESOPHAGEAL ECHOCARDIOGRAM (TEE);  Surgeon: Sanda Klein, MD;  Location: [place];  Service: Cardiovascular;  Laterality: N/A;   TEE WITHOUT CARDIOVERSION N/A 05/12/2019   Procedure: TRANSESOPHAGEAL ECHOCARDIOGRAM (TEE);  Surgeon: Skeet Latch, MD;  Location: [place];  Service: Cardiovascular;   Laterality: N/A;     Social History:   reports that he has  been smoking cigarettes. He has a 25.00 pack-year smoking history. He has never used smokeless tobacco. He reports previous drug use. Drugs: Cocaine, IV, and Heroin. He reports that he does not drink alcohol.   Family History:  His family history includes Diabetes in his father; Heart disease in his father; Mental illness in his sister. There is no history of Cancer.   Allergies No Known Allergies   Home Medications  Prior to Admission medications   Medication Sig Start Date End Date Taking? Authorizing Provider  Accu-Chek Softclix Lancets lancets USE TO CHECK BLOOD SUGAR THREE TIMES DAILY E11.65 01/14/20 01/13/21 Yes Charlott Rakes, MD  Blood Glucose Monitoring Suppl (ACCU-CHEK GUIDE ME) w/Device KIT Use to check blood sugar TID. E11.65 01/14/20  Yes Newlin, Enobong, MD  Blood Glucose Monitoring Suppl (ACCU-CHEK GUIDE) w/Device KIT USE TO CHECK BLOOD SUGAR THREE TIMES DAILY 01/14/20 01/13/21 Yes Newlin, Enobong, MD  glucose blood test strip USE TO CHECK BLOOD SUGAR THREE TIMES DAILY E11.65 01/14/20 01/13/21 Yes Newlin, Charlane Ferretti, MD  insulin glargine (LANTUS SOLOSTAR) 100 UNIT/ML Solostar Pen Inject 40 Units into the skin 2 (two) times daily. 06/23/20  Yes Newlin, Charlane Ferretti, MD  Insulin Pen Needle (TRUEPLUS 5-BEVEL PEN NEEDLES) 32G X 4 MM MISC Use as instructed to inject insulin. 11/27/19  Yes Charlott Rakes, MD  Insulin Pen Needle 31G X 5 MM MISC USE AS INSTRUCTED TO INJECT INSULIN. 11/27/19 11/26/20 Yes Charlott Rakes, MD  SUBOXONE 8-2 MG FILM SMARTSIG:1 Strip(s) By Mouth 3 Times Daily 07/14/20   [provider]    The patient is critically ill with multiple organ systems failure and requires high complexity decision making for assessment and support, frequent evaluation and titration of therapies, application of advanced monitoring technologies and extensive interpretation of multiple databases. Critical Care Time devoted to  patient care services described in this note independent of APP/resident time (if applicable)  is 32 minutes.   Sherrilyn Rist MD [facility] Personal pager: See Amion If unanswered, please page CCM On-call: [phone]

## 2020-10-04 DIAGNOSIS — E0810 Diabetes mellitus due to underlying condition with ketoacidosis without coma: Secondary | ICD-10-CM | POA: Diagnosis not present

## 2020-10-04 DIAGNOSIS — J9601 Acute respiratory failure with hypoxia: Secondary | ICD-10-CM

## 2020-10-04 DIAGNOSIS — F1910 Other psychoactive substance abuse, uncomplicated: Secondary | ICD-10-CM | POA: Diagnosis not present

## 2020-10-04 LAB — GLUCOSE, CAPILLARY
Glucose-Capillary: 180 mg/dL — ABNORMAL HIGH (ref 70–99)
Glucose-Capillary: 285 mg/dL — ABNORMAL HIGH (ref 70–99)
Glucose-Capillary: 177 mg/dL — ABNORMAL HIGH (ref 70–99)
Glucose-Capillary: 158 mg/dL — ABNORMAL HIGH (ref 70–99)
Glucose-Capillary: 99 mg/dL (ref 70–99)

## 2020-10-04 LAB — BASIC METABOLIC PANEL
Anion gap: 9 (ref 5–15)
BUN: 16 mg/dL (ref 6–20)
CO2: 24 mmol/L (ref 22–32)
Calcium: 8.2 mg/dL — ABNORMAL LOW (ref 8.9–10.3)
Chloride: 107 mmol/L (ref 98–111)
Creatinine, Ser: 1.17 mg/dL (ref 0.61–1.24)
GFR, Estimated: >60 mL/min (ref >60)
Glucose, Bld: 263 mg/dL — ABNORMAL HIGH (ref 70–99)
Potassium: 3.6 mmol/L (ref 3.5–5.1)
Sodium: 140 mmol/L (ref 135–145)

## 2020-10-04 LAB — TRIGLYCERIDES: Triglycerides: 134 mg/dL (ref <150)

## 2020-10-04 MED ORDER — POTASSIUM CHLORIDE 20 MEQ PO PACK: 40.0000 meq | PACK | Freq: Once | ORAL | Status: DC

## 2020-10-04 MED ORDER — INSULIN ASPART 100 UNIT/ML IJ SOLN
10.0000 [IU] | Freq: Once | INTRAMUSCULAR | Status: AC
  Administered 2020-10-04: 10 [IU] via SUBCUTANEOUS

## 2020-10-04 MED ORDER — POTASSIUM CHLORIDE 20 MEQ PO PACK
40.0000 meq | PACK | Freq: Once | ORAL | Status: AC
  Administered 2020-10-04: 40 meq
  Filled 2020-10-04: qty 2

## 2020-10-04 NOTE — Progress Notes (Signed)
I have attempted to wean sedation throughout the night, as recorded on MAR. I have been able to decrease propofol from 70 down to 40- he was on 30 for a few hours but woke up and became agitated. BP has been elevated, it appears BP, RR AND HR elevate at the same time. Gave fentanyl bolus per order prior to bath, re-arranged BP cuff to opposite arm and readings have been better. Tolerating suction well, secretions have decreased this AM. Will continue to assess.

## 2020-10-04 NOTE — Progress Notes (Signed)
[facility] ADULT ICU REPLACEMENT PROTOCOL   The patient does apply for the [facility] Adult ICU Electrolyte Replacment Protocol based on the criteria listed below:   1.Exclusion criteria: TCTS patients, ECMO patients and Hypothermia Protocol, and   Dialysis patients 2. Is GFR >/= 30 ml/min? Yes.    Patient's GFR today is >60 3. Is SCr </= 2? No. Patient's SCr is 1.17 mg/dL 4. Did SCr increase >/= 0.5 in 24 hours? No. 5.Pt's weight >40kg  Yes.   6. Abnormal electrolyte(s): k+ 3.6  7. Electrolytes replaced per protocol 8.  Call MD STAT for K+ </= 2.5, Phos </= 1, or Mag </= 1 Physician:    Darlys Gales 10/04/2020 3:20 AM

## 2020-10-04 NOTE — Progress Notes (Signed)
eLink Physician-Brief Progress Note Patient Name: Luke [name] DOB: 04-26-1980 MRN: [id number]   Date of Service  10/04/2020  HPI/Events of Note  Hyperglycemia - Blood glucose = 263. Patient is on D10W at 50 mL/hour and D5 LR at 125 mL/hour. Patient has been restarted on tube feeds.   eICU Interventions  Plan: D/C D10W IV infusion.  Decrease D5 LR IV infusion to 75 mL/hour.     Intervention Category Major Interventions: Hyperglycemia - active titration of insulin therapy  Nella Botsford Cornelia Copa 10/04/2020, 3:25 AM

## 2020-10-04 NOTE — Progress Notes (Signed)
NAME:  Luke [name], MRN:  [id number], DOB:  Feb 05, 1981, LOS: 2 ADMISSION DATE:  10/02/2020, CONSULTATION DATE: 10/02/2020 REFERRING MD: Dr. Florina Ou, CHIEF COMPLAINT: Agitation and combativeness requiring intubation  History of Present Illness:  Patient is a 40 year old brought to the emergency room extremely agitated by patient's girlfriend.  He was extremely uncooperative agitated and threatening to staff.  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  Attempts to sedate the patient in the emergency room included IM Haldol, Versed, Geodon, Benadryl and ketamine prior to intubation.  Sedation was hampered due to IV access.  Intubation was difficult requiring anesthesia.  It is unknown what his drug ingestion might have been.  Urine drug screen was positive for amphetamines benzodiazepine and THC.  Patient did vomit during initial intubation attempts.   He also on this admission is noted to have a creatinine above his baseline now at 1.93 previously 1.5.  Some elevation in his baseline creatinine may be due to dehydration and elevated glucose.  So appears to be in DKA.  May have artificially elevated that value.  Post intubation blood gas looked normal.  His anion gap is 21.CPK in the emergency room was 666.  Glucose is 823.  Patient's endocarditis was felt secondary to Streptococcus mitis for which he was treated for a 3 weeks with ampicillin and gentamicin then off label with oritavancin.  He has had a previous ER admission for congestive heart failure secondary to his mitral regurgitation.  Last echocardiogram ejection fraction is 70-75%.   Pertinent  Medical History    Anemia     Anxiety  Dx 2008   Cellulitis     Chronic kidney disease     Diabetes mellitus without complication ([place]) Dx 9211   DKA (diabetic ketoacidoses) 07/21/2017   Endocarditis mitral 04/2019   Endocarditis     GERD (gastroesophageal reflux disease) Dx  2008   Headache(784.0)     Heart murmur     Hepatitis C     Hypertension     Hypothyroidism     MRSA infection     Pneumonia     Seizures ([place]) 2011    x 2 in lifetime. on Dilantin for a while.   Sepsis ([place])     Substance abuse ([place]) 2013    heroin use, multiple relapses   Type 1 diabetes ([place])       Significant Hospital Events: Including procedures, antibiotic start and stop dates in addition to other pertinent events   Intubation and admission 10/02/2020 7/19 Stable, still with significant agitation  Interim History / Subjective:  No overnight event Still with significant agitation whenever sedating medications are decreased Sedated, appears comfortable Objective   Blood pressure (!) 165/86, pulse 98, temperature 98.9 F (37.2 C), temperature source Axillary, resp. rate (!) 29, height $RemoveBe'5\' 9"'VrDmDUOmC$  (1.753 m), weight 81.1 kg, SpO2 98 %.    Vent Mode: PRVC FiO2 (%):  [30 %] 30 % Set Rate:  [22 bmp] 22 bmp Vt Set:  [420 mL] 420 mL PEEP:  [5 cmH20] 5 cmH20 Plateau Pressure:  [14 cmH20-16 cmH20] 14 cmH20   Intake/Output Summary (Last 24 hours) at 10/04/2020 0850 Last data filed at 10/04/2020 0826 Gross per 24 hour  Intake 3721.31 ml  Output 2155 ml  Net 1566.31 ml   Filed Weights   10/02/20 0831 10/03/20 0214 10/04/20 0417  Weight: 78.8 kg 78.9 kg 81.1 kg    Examination: General:  sedate, appears comfortable HENT: Moist oral mucosa, anicteric, endotracheal tube in place  Lungs: Decreased breath sounds at the bases bilaterally, mild rhonchi Cardiovascular: S1-S2 appreciated Abdomen: BS appreciated Extremities: Bilateral upper extremity scaring Neuro: Sedated, arousable GU: fair output  Resolved Hospital Problem list   NA  Assessment & Plan:  1.  Polysubstance abuse 2.  Continuing agitation -Remains on sedation on fentanyl, propofol, precedex -We will continue to attempt to wean off sedation as tolerated  3.  DKA -On SSI at present -Gap has closed -Was volume  resuscitated -Overall about 4 L positive since admission  4.  Acute kidney injury on stage III CKD -Appears to be resolving  5.  History of hepatitis C -Outpatient follow-up  6.  Heroin and methamphetamine drug abuse  7.  History of mitral valve endocarditis with regurgitation -Treated February 2021  Aspiration pneumonitis -Continue on Unasyn  7.  History of mitral valve endocarditis with regurgitation -Treated February 2021  8.  Aspiration pneumonitis -Continue Unasyn  Not tolerating weaning at present secondary to significant agitation  Best Practice (right click and "Reselect all SmartList Selections" daily)   Diet/type: tubefeeds and NPO DVT prophylaxis: LMWH GI prophylaxis: H2B Lines: N/A Foley:  N/A Code Status:  full code Last date of multidisciplinary goals of care discussion [patient intubated and sedated]  Labs   CBC: Recent Labs  Lab 10/02/20 0240 10/03/20 0238 10/03/20 1246  WBC 12.7* SPECIMEN CONTAMINATED, UNABLE TO PERFORM TEST(S). 13.9*  NEUTROABS 9.6*  --   --   HGB 13.4 SPECIMEN CONTAMINATED, UNABLE TO PERFORM TEST(S). 10.0*  HCT 38.9* SPECIMEN CONTAMINATED, UNABLE TO PERFORM TEST(S). 29.7*  MCV 83.7 SPECIMEN CONTAMINATED, UNABLE TO PERFORM TEST(S). 85.6  PLT 376 SPECIMEN CONTAMINATED, UNABLE TO PERFORM TEST(S). 676    Basic Metabolic Panel: Recent Labs  Lab 10/02/20 1810 10/03/20 0238 10/03/20 1111 10/03/20 1759 10/04/20 0224  NA 141 143 142 140 140  K 3.6 3.5 3.5 3.4* 3.6  CL 110 111 111 109 107  CO2 $Re'24 26 26 26 24  'kDN$ GLUCOSE 165* 65* 102* 99 263*  BUN 27* 26* $Remov'20 17 16  'dhLiGj$ CREATININE 1.55* 1.34* 1.28* 1.15 1.17  CALCIUM 8.3* 8.3* 8.2* 8.3* 8.2*  MG  --  1.9  --   --   --   PHOS  --  4.2  --   --   --    GFR: Estimated Creatinine Clearance: 83.9 mL/min (by C-G formula based on SCr of 1.17 mg/dL). Recent Labs  Lab 10/02/20 0240 10/03/20 0238 10/03/20 1246  WBC 12.7* SPECIMEN CONTAMINATED, UNABLE TO PERFORM TEST(S). 13.9*     Liver Function Tests: No results for input(s): AST, ALT, ALKPHOS, BILITOT, PROT, ALBUMIN in the last 168 hours. No results for input(s): LIPASE, AMYLASE in the last 168 hours. No results for input(s): AMMONIA in the last 168 hours.  ABG    Component Value Date/Time   PHART 7.345 (L) 10/02/2020 0850   PCO2ART 44.1 10/02/2020 0850   PO2ART 91.4 10/02/2020 0850   HCO3 23.4 10/02/2020 0850   TCO2 17 (L) 03/19/2019 0042   ACIDBASEDEF 1.8 10/02/2020 0850   O2SAT 96.4 10/02/2020 0850     Coagulation Profile: No results for input(s): INR, PROTIME in the last 168 hours.  Cardiac Enzymes: Recent Labs  Lab 10/02/20 0240  CKTOTAL 666*    HbA1C: Hemoglobin A1C  Date/Time Value Ref Range Status  04/11/2014 12:54 AM 11.3 (H) 4.2 - 6.3 % Final    Comment:    The American Diabetes  Association recommends that a primary goal of therapy should be <7% and that physicians should reevaluate the treatment regimen in patients with HbA1c values consistently >8%.    HbA1c POC (<> result, manual entry)  Date/Time Value Ref Range Status  03/24/2018 09:45 AM >15 4.0 - 5.6 % Final   Hgb A1c MFr Bld  Date/Time Value Ref Range Status  03/04/2020 03:44 PM 11.6 (H) 4.8 - 5.6 % Final    Comment:             Prediabetes: 5.7 - 6.4          Diabetes: >6.4          Glycemic control for adults with diabetes: <7.0   08/27/2019 09:42 PM 10.8 (H) 4.8 - 5.6 % Final    Comment:    (NOTE) Pre diabetes:          5.7%-6.4%  Diabetes:              >6.4%  Glycemic control for   <7.0% adults with diabetes     CBG: Recent Labs  Lab 10/03/20 1531 10/03/20 1949 10/03/20 2315 10/04/20 0402 10/04/20 0733  GLUCAP 103* 117* 172* 180* 285*    Review of Systems:   Unable to obtain due to intubation and sedation  Past Medical History:  He,  has a past medical history of Anemia, Anxiety ( Dx 2008), Cellulitis, Chronic kidney disease, Diabetes mellitus without complication ([place]) (Dx 4401), DKA  (diabetic ketoacidoses) (07/21/2017), Endocarditis (04/2019), Endocarditis, GERD (gastroesophageal reflux disease) (Dx 2008), Headache(784.0), Heart murmur, Hepatitis C, Hypertension, Hypothyroidism, MRSA infection, Necrotizing fasciitis of multiple sites [facility]), Pneumonia, Seizures ([place]) (2011), Sepsis ([place]), Substance abuse ([place]) (2013), and Type 1 diabetes ([place]).   Surgical History:   Past Surgical History:  Procedure Laterality Date   AMPUTATION Left 09/05/2018   Procedure: LEFT GREAT TOE AMPUTATION;  Surgeon: [name], MD;  Location: [facility];  Service: Orthopedics;  Laterality: Left;   APPLICATION OF A-CELL OF EXTREMITY Bilateral 09/01/2019   Procedure: APPLICATION OF MESHED PRIMATRIX AG OF EXTREMITY;  Surgeon: Cindra Presume, MD;  Location: [facility];  Service: Plastics;  Laterality: Bilateral;   I & D EXTREMITY Left 10/11/2012   Procedure: IRRIGATION AND DEBRIDEMENT ABSCESS FOREARM;  Surgeon: Linna Hoff, MD;  Location: [facility];  Service: Orthopedics;  Laterality: Left;   I & D EXTREMITY Left 10/12/2012   Procedure: IRRIGATION AND DEBRIDEMENT FOREARM;  Surgeon: Linna Hoff, MD;  Location: [place];  Service: Orthopedics;  Laterality: Left;   I & D EXTREMITY Left 10/14/2012   Procedure: incision and drainage left forearm;  Surgeon: Linna Hoff, MD;  Location: [place];  Service: Orthopedics;  Laterality: Left;   I & D EXTREMITY Left 10/16/2012   Procedure: IRRIGATION AND DEBRIDEMENT LEFT FOREARM;  Surgeon: Linna Hoff, MD;  Location: [facility];  Service: Orthopedics;  Laterality: Left;   I & D EXTREMITY Left 10/20/2012   Procedure: INCISION AND DRAINAGE AND DEBRIDEMENT LEFT  FOREARM;  Surgeon: Linna Hoff, MD;  Location: [facility];  Service: Orthopedics;  Laterality: Left;   I & D EXTREMITY Right 05/22/2019   Procedure: IRRIGATION AND DEBRIDEMENT RIGHT ARM;  Surgeon: [name], MD;  Location: [facility];  Service: Orthopedics;  Laterality: Right;   INCISION AND DRAINAGE OF WOUND Bilateral 11/29/2017    Procedure: DEBRIDEMENT BILATERAL FEET, DEBRIDEMENT LEFT ANKLE, AND APPLY WOUND VAC;  Surgeon: [name], MD;  Location: [facility];  Service: Orthopedics;  Laterality: Bilateral;   INCISION AND DRAINAGE OF WOUND Bilateral 09/01/2019   Procedure: IRRIGATION AND DEBRIDEMENT FOREARM WOUNDS;  Surgeon: Cindra Presume, MD;  Location: [facility];  Service: Plastics;  Laterality: Bilateral;   IRRIGATION AND DEBRIDEMENT ABSCESS     Hx: of left arm abscess related to drug use    TEE WITHOUT CARDIOVERSION N/A 11/28/2017   Procedure: TRANSESOPHAGEAL ECHOCARDIOGRAM (TEE);  Surgeon: Sanda Klein, MD;  Location: [facility] [place];  Service: Cardiovascular;  Laterality: N/A;   TEE WITHOUT CARDIOVERSION N/A 05/12/2019   Procedure: TRANSESOPHAGEAL ECHOCARDIOGRAM (TEE);  Surgeon: Skeet Latch, MD;  Location: [facility];  Service: Cardiovascular;  Laterality: N/A;     Social History:   reports that he has been smoking cigarettes. He has a 25.00 pack-year smoking history. He has never used smokeless tobacco. He reports previous drug use. Drugs: Cocaine, IV, and Heroin. He reports that he does not drink alcohol.   Family History:  His family history includes Diabetes in his father; Heart disease in his father; Mental illness in his sister. There is no history of Cancer.   Allergies No Known Allergies   Home Medications  Prior to Admission medications   Medication Sig Start Date End Date Taking? Authorizing Provider  Accu-Chek Softclix Lancets lancets USE TO CHECK BLOOD SUGAR THREE TIMES DAILY E11.65 01/14/20 01/13/21 Yes Charlott Rakes, MD  Blood Glucose Monitoring Suppl (ACCU-CHEK GUIDE ME) w/Device KIT Use to check blood sugar TID. E11.65 01/14/20  Yes Newlin, Enobong, MD  Blood Glucose Monitoring Suppl (ACCU-CHEK GUIDE) w/Device KIT USE TO CHECK BLOOD SUGAR THREE TIMES DAILY 01/14/20 01/13/21 Yes Newlin, Enobong, MD  glucose blood test strip USE TO CHECK BLOOD SUGAR THREE TIMES DAILY E11.65 01/14/20  01/13/21 Yes Newlin, Charlane Ferretti, MD  insulin glargine (LANTUS SOLOSTAR) 100 UNIT/ML Solostar Pen Inject 40 Units into the skin 2 (two) times daily. 06/23/20  Yes Newlin, Charlane Ferretti, MD  Insulin Pen Needle (TRUEPLUS 5-BEVEL PEN NEEDLES) 32G X 4 MM MISC Use as instructed to inject insulin. 11/27/19  Yes Charlott Rakes, MD  Insulin Pen Needle 31G X 5 MM MISC USE AS INSTRUCTED TO INJECT INSULIN. 11/27/19 11/26/20 Yes Charlott Rakes, MD  SUBOXONE 8-2 MG FILM SMARTSIG:1 Strip(s) By Mouth 3 Times Daily 07/14/20   [provider]    The patient is critically ill with multiple organ systems failure and requires high complexity decision making for assessment and support, frequent evaluation and titration of therapies, application of advanced monitoring technologies and extensive interpretation of multiple databases. Critical Care Time devoted to patient care services described in this note independent of APP/resident time (if applicable)  is 35 minutes.   Sherrilyn Rist MD [facility] Personal pager: See Amion If unanswered, please page CCM On-call: [phone]

## 2020-10-05 ENCOUNTER — Inpatient Hospital Stay (HOSPITAL_COMMUNITY): Payer: Medicaid Other

## 2020-10-05 DIAGNOSIS — F1910 Other psychoactive substance abuse, uncomplicated: Secondary | ICD-10-CM | POA: Diagnosis not present

## 2020-10-05 DIAGNOSIS — E0810 Diabetes mellitus due to underlying condition with ketoacidosis without coma: Secondary | ICD-10-CM | POA: Diagnosis not present

## 2020-10-05 DIAGNOSIS — N179 Acute kidney failure, unspecified: Secondary | ICD-10-CM | POA: Diagnosis not present

## 2020-10-05 DIAGNOSIS — J9601 Acute respiratory failure with hypoxia: Secondary | ICD-10-CM | POA: Diagnosis not present

## 2020-10-05 DIAGNOSIS — J690 Pneumonitis due to inhalation of food and vomit: Secondary | ICD-10-CM

## 2020-10-05 LAB — GLUCOSE, CAPILLARY
Glucose-Capillary: 242 mg/dL — ABNORMAL HIGH (ref 70–99)
Glucose-Capillary: 273 mg/dL — ABNORMAL HIGH (ref 70–99)
Glucose-Capillary: 327 mg/dL — ABNORMAL HIGH (ref 70–99)
Glucose-Capillary: 259 mg/dL — ABNORMAL HIGH (ref 70–99)
Glucose-Capillary: 312 mg/dL — ABNORMAL HIGH (ref 70–99)
Glucose-Capillary: 121 mg/dL — ABNORMAL HIGH (ref 70–99)
Glucose-Capillary: 52 mg/dL — ABNORMAL LOW (ref 70–99)
Glucose-Capillary: 53 mg/dL — ABNORMAL LOW (ref 70–99)
Glucose-Capillary: 156 mg/dL — ABNORMAL HIGH (ref 70–99)
Glucose-Capillary: 173 mg/dL — ABNORMAL HIGH (ref 70–99)

## 2020-10-05 LAB — CBC WITH DIFFERENTIAL/PLATELET
Abs Immature Granulocytes: 0.06 10*3/uL (ref 0.00–0.07)
Basophils Absolute: 0 10*3/uL (ref 0.0–0.1)
Basophils Relative: 0 %
Eosinophils Absolute: 0.2 10*3/uL (ref 0.0–0.5)
Eosinophils Relative: 2 %
HCT: 27.9 % — ABNORMAL LOW (ref 39.0–52.0)
Hemoglobin: 9.3 g/dL — ABNORMAL LOW (ref 13.0–17.0)
Immature Granulocytes: 1 %
Lymphocytes Relative: 20 %
Lymphs Abs: 1.6 10*3/uL (ref 0.7–4.0)
MCH: 28.9 pg (ref 26.0–34.0)
MCHC: 33.3 g/dL (ref 30.0–36.0)
MCV: 86.6 fL (ref 80.0–100.0)
Monocytes Absolute: 0.8 10*3/uL (ref 0.1–1.0)
Monocytes Relative: 10 %
Neutro Abs: 5.4 10*3/uL (ref 1.7–7.7)
Neutrophils Relative %: 67 %
Platelets: 240 10*3/uL (ref 150–400)
RBC: 3.22 MIL/uL — ABNORMAL LOW (ref 4.22–5.81)
RDW: 13.2 % (ref 11.5–15.5)
WBC: 8.1 10*3/uL (ref 4.0–10.5)
nRBC: 0 % (ref 0.0–0.2)

## 2020-10-05 LAB — BASIC METABOLIC PANEL
Anion gap: 8 (ref 5–15)
BUN: 16 mg/dL (ref 6–20)
CO2: 25 mmol/L (ref 22–32)
Calcium: 8 mg/dL — ABNORMAL LOW (ref 8.9–10.3)
Chloride: 106 mmol/L (ref 98–111)
Creatinine, Ser: 1.18 mg/dL (ref 0.61–1.24)
GFR, Estimated: >60 mL/min (ref >60)
Glucose, Bld: 326 mg/dL — ABNORMAL HIGH (ref 70–99)
Potassium: 3.8 mmol/L (ref 3.5–5.1)
Sodium: 139 mmol/L (ref 135–145)

## 2020-10-05 LAB — CK: Total CK: 599 U/L — ABNORMAL HIGH (ref 49–397)

## 2020-10-05 LAB — CULTURE, RESPIRATORY W GRAM STAIN: Culture: NORMAL

## 2020-10-05 MED ORDER — OSMOLITE 1.5 CAL PO LIQD
1000.0000 mL | ORAL | Status: DC
  Administered 2020-10-05: 1000 mL
  Filled 2020-10-05 (×3): qty 1000

## 2020-10-05 MED ORDER — DEXTROSE 50 % IV SOLN
INTRAVENOUS | Status: AC
  Administered 2020-10-05: 50 mL
  Filled 2020-10-05: qty 50

## 2020-10-05 MED ORDER — INSULIN ASPART 100 UNIT/ML IJ SOLN
0.0000 [IU] | INTRAMUSCULAR | Status: DC
  Administered 2020-10-05: 15 [IU] via SUBCUTANEOUS
  Administered 2020-10-05: 11 [IU] via SUBCUTANEOUS
  Administered 2020-10-05: 3 [IU] via SUBCUTANEOUS
  Administered 2020-10-06 (×2): 4 [IU] via SUBCUTANEOUS
  Administered 2020-10-06 – 2020-10-07 (×2): 7 [IU] via SUBCUTANEOUS
  Administered 2020-10-07: 4 [IU] via SUBCUTANEOUS
  Administered 2020-10-07: 3 [IU] via SUBCUTANEOUS
  Administered 2020-10-07 – 2020-10-08 (×2): 4 [IU] via SUBCUTANEOUS
  Administered 2020-10-08: 7 [IU] via SUBCUTANEOUS
  Administered 2020-10-08: 4 [IU] via SUBCUTANEOUS
  Administered 2020-10-08: 7 [IU] via SUBCUTANEOUS
  Administered 2020-10-09: 3 [IU] via SUBCUTANEOUS
  Administered 2020-10-09: 11 [IU] via SUBCUTANEOUS
  Administered 2020-10-09: 4 [IU] via SUBCUTANEOUS
  Administered 2020-10-09 (×2): 7 [IU] via SUBCUTANEOUS
  Administered 2020-10-09 – 2020-10-10 (×3): 4 [IU] via SUBCUTANEOUS

## 2020-10-05 MED ORDER — BISACODYL 5 MG PO TBEC: 10.0000 mg | DELAYED_RELEASE_TABLET | Freq: Once | ORAL | Status: DC

## 2020-10-05 MED ORDER — CLONAZEPAM 1 MG PO TABS
1.0000 mg | ORAL_TABLET | Freq: Two times a day (BID) | ORAL | Status: DC
  Administered 2020-10-05 – 2020-10-06 (×3): 1 mg
  Filled 2020-10-05 (×3): qty 1

## 2020-10-05 MED ORDER — SENNOSIDES 8.8 MG/5ML PO SYRP
10.0000 mL | ORAL_SOLUTION | Freq: Once | ORAL | Status: AC
  Administered 2020-10-05: 10 mL
  Filled 2020-10-05: qty 10

## 2020-10-05 MED ORDER — POLYETHYLENE GLYCOL 3350 17 G PO PACK
17.0000 g | PACK | Freq: Every day | ORAL | Status: DC
  Administered 2020-10-05 – 2020-10-07 (×3): 17 g
  Filled 2020-10-05 (×3): qty 1

## 2020-10-05 MED ORDER — INSULIN DETEMIR 100 UNIT/ML ~~LOC~~ SOLN
15.0000 [IU] | Freq: Two times a day (BID) | SUBCUTANEOUS | Status: DC
  Filled 2020-10-05: qty 0.15

## 2020-10-05 MED ORDER — PHENOL 1.4 % MT LIQD: 1.0000 | OROMUCOSAL | Status: DC | PRN

## 2020-10-05 MED ORDER — DOCUSATE SODIUM 50 MG/5ML PO LIQD
100.0000 mg | Freq: Every day | ORAL | Status: DC
  Administered 2020-10-05 – 2020-10-07 (×3): 100 mg
  Filled 2020-10-05 (×4): qty 10

## 2020-10-05 MED ORDER — INSULIN DETEMIR 100 UNIT/ML ~~LOC~~ SOLN
9.0000 [IU] | Freq: Two times a day (BID) | SUBCUTANEOUS | Status: DC
  Filled 2020-10-05: qty 0.09

## 2020-10-05 MED ORDER — INSULIN DETEMIR 100 UNIT/ML ~~LOC~~ SOLN
15.0000 [IU] | Freq: Two times a day (BID) | SUBCUTANEOUS | Status: DC
  Administered 2020-10-05 (×2): 15 [IU] via SUBCUTANEOUS
  Filled 2020-10-05 (×3): qty 0.15

## 2020-10-05 MED ORDER — QUETIAPINE FUMARATE 50 MG PO TABS
50.0000 mg | ORAL_TABLET | Freq: Two times a day (BID) | ORAL | Status: DC
  Administered 2020-10-05 – 2020-10-09 (×8): 50 mg
  Filled 2020-10-05 (×9): qty 1

## 2020-10-05 NOTE — Progress Notes (Signed)
Inpatient Diabetes Program Recommendations  AACE/ADA: New Consensus Statement on Inpatient Glycemic Control (2015)  Target Ranges:  Prepandial:   less than 140 mg/dL      Peak postprandial:   less than 180 mg/dL (1-2 hours)      Critically ill patients:  140 - 180 mg/dL   Lab Results  Component Value Date   GLUCAP 259 (H) 10/05/2020   HGBA1C 11.6 (H) 03/04/2020    Review of Glycemic Control  Diabetes history: DM2 Outpatient Diabetes medications: Lantus 40 units BID Current orders for Inpatient glycemic control: Novolog 0-20 units Q4H  CBGs today: 327, 273, 259  Inpatient Diabetes Program Recommendations:    Add Levemir 15 units BID Change Novolog to 0-20 units Q4H Add Novolog 3 units Q4H for TF coverage.  Will closely follow glucose trends.  Thank you. Lorenda Peck, RD, LDN, CDE Inpatient Diabetes Coordinator [phone]

## 2020-10-05 NOTE — Progress Notes (Signed)
eLink Physician-Brief Progress Note Patient Name: Luke [name] DOB: 1980/04/07 MRN: [id number]   Date of Service  10/05/2020  HPI/Events of Note  Patient with sub-optimal glycemic control.  eICU Interventions  Levemir and SSI adjusted to optimize glycemic control.        Kerry Kass Anthea Udovich 10/05/2020, 3:41 AM

## 2020-10-05 NOTE — TOC Initial Note (Signed)
Transition of Care [facility]) - Initial/Assessment Note    Patient Details  Name: Luke [name] MRN: [id number] Date of Birth: 1980-09-16  Transition of [facility]) CM/SW Contact:    Leeroy Cha, RN Phone Number: 10/05/2020, 8:44 AM  Clinical Narrative:                 40 year old brought to the emergency room extremely agitated by patient's girlfriend.  He was extremely uncooperative agitated and threatening to staff.  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  Attempts to sedate the patient in the emergency room included IM Haldol, Versed, Geodon, Benadryl and ketamine prior to intubation.  Sedation was hampered due to IV access.  Intubation was difficult requiring anesthesia.  It is unknown what his drug ingestion might have been.  Urine drug screen was positive for amphetamines benzodiazepine and THC.  Patient did vomit during initial intubation attempts.   He also on this admission is noted to have a creatinine above his baseline now at 1.93 previously 1.5.  Some elevation in his baseline creatinine may be due to dehydration and elevated glucose.  So appears to be in DKA.  May have artificially elevated that value.  Post intubation blood gas looked normal.  His anion gap is 21.CPK in the emergency room was 666.  Glucose is 823.   Patient's endocarditis was felt secondary to Streptococcus mitis for which he was treated for a 3 weeks with ampicillin and gentamicin then off label with oritavancin.  He has had a previous ER admission for congestive heart failure secondary to his mitral regurgitation.  Last echocardiogram ejection fraction is 70-75 TOC PLAN OF CARE: Following for toc needs-diabetic may need hhc for regulation of insulin, has medicaid has insurance. Follwing for progression. Expected Discharge Plan: [place] Barriers to Discharge: Continued Medical Work up   Patient Goals and CMS  Choice Patient states their goals for this hospitalization and ongoing recovery are:: intubated      Expected Discharge Plan and Services Expected Discharge Plan: [place]   Discharge Planning Services: CM Consult   Living arrangements for the past 2 months: Single Family Home                                      Prior Living Arrangements/Services Living arrangements for the past 2 months: Single Family Home Lives with:: Spouse                   Activities of Daily Living      Permission Sought/Granted                  Emotional Assessment   Attitude/Demeanor/Rapport: Intubated (Following Commands or Not Following Commands) Affect (typically observed): Unable to Assess Orientation: : Fluctuating Orientation (Suspected and/or reported Sundowners) Alcohol / Substance Use: Illicit Drugs, Alcohol Use, Tobacco Use Psych Involvement: No (comment)  Admission diagnosis:  Polythelia [Q83.3] DKA (diabetic ketoacidosis) ([place]) [E11.10] Hyperglycemia [R73.9] Encounter for orogastric (OG) tube placement [Z46.59] Polysubstance abuse ([place]) [F19.10] Difficult intubation [T88.4XXA] Difficult airway for intubation, initial encounter [T88.4XXA] Delirium due to multiple etiologies, acute, hyperactive [F05] Patient Active Problem List   Diagnosis Date Noted   DKA (diabetic ketoacidosis) ([place]) 10/02/2020   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   Congestive heart failure (CHF) ([place]) 08/27/2019  Chest pain 08/20/2019   Elevated troponin 08/20/2019   Abscess of right forearm    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   Acute bacterial endocarditis    Heroin abuse ([place]) 05/09/2019   Cellulitis of forearms    Hypothyroidism    Diabetic ulcer of left foot ([place]) 08/28/2018   Hepatitis C 11/25/2017   GERD (gastroesophageal reflux disease) 07/21/2017   Diabetic polyneuropathy associated with type 1 diabetes mellitus ([place])    Noncompliant bladder     Urinary retention    ARF (acute renal failure) ([place]) 12/16/2016   Anemia 12/16/2016   Cocaine abuse ([place]) 08/16/2015   Uncontrolled insulin dependent type 1 diabetes mellitus ([place]) 08/16/2015   Cellulitis 12/03/2014   AKI (acute kidney injury) ([place]) 12/03/2014   Elevated liver enzymes 07/26/2014   CKD (chronic kidney disease) stage 3, GFR 30-59 ml/min ([place]) 10/14/2012   Drug abuse, IV ([place]) 10/11/2012   Tobacco abuse 10/11/2012   Essential hypertension 10/11/2012   PCP:  Gildardo Pounds, NP Pharmacy:   [facility], [place] [address] [place] [address] Phone: [phone] Fax: [phone]  [id number] [place] [id number] [id number]: [phone]: [address]: [phone]: [phone]    Social Determinants of Health (SDOH) Interventions    Readmission Risk Interventions Readmission Risk Prevention Plan 09/10/2019 09/09/2019 05/11/2019  Transportation Screening Complete - Complete  PCP or Specialist Appt within 5-7 Days - - Not Complete  Not Complete comments - - pt agreeable when ready for dc.  Home Care Screening - - Complete  Medication Review (RN CM) - - Referral to Pharmacy  Medication Review (RN Care Manager) Complete - -  PCP or Specialist appointment within 3-5 days of discharge Complete - -  [place] [facility] Consult Complete - -  SW Recovery Care/Counseling Consult Complete - -  Palliative Care Screening Not Applicable Not Applicable -  [place] Applic[place] Not Applicable -  Some recent data might be hidden

## 2020-10-05 NOTE — Progress Notes (Signed)
NAME:  Luke [name], MRN:  [id number], DOB:  Sep 02, 1980, LOS: 3 ADMISSION DATE:  10/02/2020, CONSULTATION DATE: 10/02/2020 REFERRING MD: Dr. Florina Ou, CHIEF COMPLAINT: Agitation and combativeness requiring intubation  History of Present Illness:  Patient is a 40 year old brought to the emergency room extremely agitated by patient's girlfriend.  He was extremely uncooperative agitated and threatening to staff.  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  Attempts to sedate the patient in the emergency room included IM Haldol, Versed, Geodon, Benadryl and ketamine prior to intubation.  Sedation was hampered due to IV access.  Intubation was difficult requiring anesthesia.  It is unknown what his drug ingestion might have been.  Urine drug screen was positive for amphetamines benzodiazepine and THC.  Patient did vomit during initial intubation attempts.   He also on this admission is noted to have a creatinine above his baseline now at 1.93 previously 1.5.  Some elevation in his baseline creatinine may be due to dehydration and elevated glucose.  So appears to be in DKA.  May have artificially elevated that value.  Post intubation blood gas looked normal.  His anion gap is 21.CPK in the emergency room was 666.  Glucose is 823.  Patient's endocarditis was felt secondary to Streptococcus mitis for which he was treated for a 3 weeks with ampicillin and gentamicin then off label with oritavancin.  He has had a previous ER admission for congestive heart failure secondary to his mitral regurgitation.  Last echocardiogram ejection fraction is 70-75%.   Pertinent  Medical History    Anemia     Anxiety  Dx 2008   Cellulitis     Chronic kidney disease     Diabetes mellitus without complication ([place]) Dx 2119   DKA (diabetic ketoacidoses) 07/21/2017   Endocarditis mitral 04/2019   Endocarditis     GERD (gastroesophageal reflux disease) Dx  2008   Headache(784.0)     Heart murmur     Hepatitis C     Hypertension     Hypothyroidism     MRSA infection     Pneumonia     Seizures ([place]) 2011    x 2 in lifetime. on Dilantin for a while.   Sepsis ([place])     Substance abuse ([place]) 2013    heroin use, multiple relapses   Type 1 diabetes ([place])       Significant Hospital Events: Including procedures, antibiotic start and stop dates in addition to other pertinent events   Intubation and admission 10/02/2020 7/19 Stable, still with significant agitation 7/20 minimal vent requirement, still gets very agitated with weaning sedatives  Interim History / Subjective:  No overnight event Still with significant agitation Decreased secretions  Objective   Blood pressure 134/74, pulse 82, temperature 98.9 F (37.2 C), temperature source Axillary, resp. rate (!) 22, height $RemoveBe'5\' 9"'nMiDtuorc$  (1.753 m), weight 83.5 kg, SpO2 96 %.    Vent Mode: PSV;CPAP FiO2 (%):  [30 %] 30 % Set Rate:  [22 bmp] 22 bmp Vt Set:  [420 mL] 420 mL PEEP:  [5 cmH20] 5 cmH20 Pressure Support:  [5 cmH20] 5 cmH20 Plateau Pressure:  [12 cmH20-19 cmH20] 18 cmH20   Intake/Output Summary (Last 24 hours) at 10/05/2020 0852 Last data filed at 10/05/2020 0400 Gross per 24 hour  Intake 2724.29 ml  Output 1300 ml  Net 1424.29 ml   Filed Weights   10/03/20 0214 10/04/20 0417 10/05/20 0500  Weight: 78.9 kg 81.1 kg 83.5 kg    Examination: General: Sedated, appears comfortable HENT: Moist oral mucosa, anicteric, endotracheal tube in place  lungs: Decreased breath sounds bilaterally Cardiovascular: S1-S2 appreciated Abdomen: Bowel sounds appreciated Extremities: Bilateral upper extremity scarring Neuro: Remains sedated GU: fair output  Resolved Hospital Problem list   NA  Assessment & Plan:  1.  Polysubstance abuse 2.  Continuing agitation -Remains on sedation on fentanyl, propofol, Precedex -Continue to attempt to wean off sedation as tolerated -Add Klonopin 1 p.o.  twice daily per tube -Add Seroquel  3.  DKA -Gap closed -On SSI -Sugars still running high, Levemir increased  4.  Acute kidney injury on chronic kidney disease stage III -Appears to be resolving -Avoid nephrotoxic's  5.  History of hepatitis C -Outpatient follow-up  6.  Heroin and methamphetamine drug abuse  7.  History of mitral valve endocarditis with regurgitation -Treated February 2021  8.  Aspiration pneumonitis -X-ray remains with bilateral infiltration -Remains on Unasyn -Leukocytosis improving  Patient still very very agitated on weaning sedation Ventilator requirement improving  We will continue to wean sedation as tolerated    Best Practice (right click and "Reselect all SmartList Selections" daily)   Diet/type: tubefeeds DVT prophylaxis: LMWH GI prophylaxis: H2B Lines: N/A Foley:  N/A Code Status:  full code Last date of multidisciplinary goals of care discussion [patient intubated and sedated]  Labs   CBC: Recent Labs  Lab 10/02/20 0240 10/03/20 0238 10/03/20 1246 10/05/20 0449  WBC 12.7* SPECIMEN CONTAMINATED, UNABLE TO PERFORM TEST(S). 13.9* 8.1  NEUTROABS 9.6*  --   --  5.4  HGB 13.4 SPECIMEN CONTAMINATED, UNABLE TO PERFORM TEST(S). 10.0* 9.3*  HCT 38.9* SPECIMEN CONTAMINATED, UNABLE TO PERFORM TEST(S). 29.7* 27.9*  MCV 83.7 SPECIMEN CONTAMINATED, UNABLE TO PERFORM TEST(S). 85.6 86.6  PLT 376 SPECIMEN CONTAMINATED, UNABLE TO PERFORM TEST(S). 272 656    Basic Metabolic Panel: Recent Labs  Lab 10/03/20 0238 10/03/20 1111 10/03/20 1759 10/04/20 0224 10/05/20 0449  NA 143 142 140 140 139  K 3.5 3.5 3.4* 3.6 3.8  CL 111 111 109 107 106  CO2 $Re'26 26 26 24 25  'hlo$ GLUCOSE 65* 102* 99 263* 326*  BUN 26* $Remov'20 17 16 16  'yeOnxG$ CREATININE 1.34* 1.28* 1.15 1.17 1.18  CALCIUM 8.3* 8.2* 8.3* 8.2* 8.0*  MG 1.9  --   --   --   --   PHOS 4.2  --   --   --   --    GFR: Estimated Creatinine Clearance: 83.2 mL/min (by C-G formula based on SCr of 1.18  mg/dL). Recent Labs  Lab 10/02/20 0240 10/03/20 0238 10/03/20 1246 10/05/20 0449  WBC 12.7* SPECIMEN CONTAMINATED, UNABLE TO PERFORM TEST(S). 13.9* 8.1    Liver Function Tests: No results for input(s): AST, ALT, ALKPHOS, BILITOT, PROT, ALBUMIN in the last 168 hours. No results for input(s): LIPASE, AMYLASE in the last 168 hours. No results for input(s): AMMONIA in the last 168 hours.  ABG    Component Value Date/Time   PHART 7.345 (L) 10/02/2020 0850   PCO2ART 44.1 10/02/2020 0850   PO2ART 91.4 10/02/2020 0850   HCO3 23.4 10/02/2020 0850   TCO2 17 (L) 03/19/2019 0042   ACIDBASEDEF 1.8 10/02/2020 0850   O2SAT 96.4 10/02/2020 0850     Coagulation Profile: No results for input(s): INR, PROTIME in the last 168 hours.  Cardiac Enzymes: Recent Labs  Lab 10/02/20 0240 10/05/20 0449  CKTOTAL 666* 599*    HbA1C: Hemoglobin A1C  Date/Time Value Ref Range Status  04/11/2014 12:54 AM 11.3 (H) 4.2 - 6.3 % Final    Comment:    The American Diabetes Association recommends that a primary goal of therapy should be <7% and that physicians should reevaluate the treatment regimen in patients with HbA1c values consistently >8%.    HbA1c POC (<> result, manual entry)  Date/Time Value Ref Range Status  03/24/2018 09:45 AM >15 4.0 - 5.6 % Final   Hgb A1c MFr Bld  Date/Time Value Ref Range Status  03/04/2020 03:44 PM 11.6 (H) 4.8 - 5.6 % Final    Comment:             Prediabetes: 5.7 - 6.4          Diabetes: >6.4          Glycemic control for adults with diabetes: <7.0   08/27/2019 09:42 PM 10.8 (H) 4.8 - 5.6 % Final    Comment:    (NOTE) Pre diabetes:          5.7%-6.4%  Diabetes:              >6.4%  Glycemic control for   <7.0% adults with diabetes     CBG: Recent Labs  Lab 10/04/20 1540 10/04/20 1956 10/04/20 2342 10/05/20 0309 10/05/20 0804  GLUCAP 158* 99 242* 327* 273*    Review of Systems:   Unable to obtain due to intubation and sedation  Past  Medical History:  He,  has a past medical history of Anemia, Anxiety ( Dx 2008), Cellulitis, Chronic kidney disease, Diabetes mellitus without complication ([place]) (Dx 7517), DKA (diabetic ketoacidoses) (07/21/2017), Endocarditis (04/2019), Endocarditis, GERD (gastroesophageal reflux disease) (Dx 2008), Headache(784.0), Heart murmur, Hepatitis C, Hypertension, Hypothyroidism, MRSA infection, Necrotizing fasciitis of multiple sites [facility]), Pneumonia, Seizures ([place]) (2011), Sepsis ([place]), Substance abuse ([place]) (2013), and Type 1 diabetes ([place]).   Surgical History:   Past Surgical History:  Procedure Laterality Date   AMPUTATION Left 09/05/2018   Procedure: LEFT GREAT TOE AMPUTATION;  Surgeon: [name], MD;  Location: [place];  Service: Orthopedics;  Laterality: Left;   APPLICATION OF A-CELL OF EXTREMITY Bilateral 09/01/2019   Procedure: APPLICATION OF MESHED PRIMATRIX AG OF EXTREMITY;  Surgeon: Cindra Presume, MD;  Location: [place];  Service: Plastics;  Laterality: Bilateral;   I & D EXTREMITY Left 10/11/2012   Procedure: IRRIGATION AND DEBRIDEMENT ABSCESS FOREARM;  Surgeon: Linna Hoff, MD;  Location: [place];  Service: Orthopedics;  Laterality: Left;   I & D EXTREMITY Left 10/12/2012   Procedure: IRRIGATION AND DEBRIDEMENT FOREARM;  Surgeon: Linna Hoff, MD;  Location: [place];  Service: Orthopedics;  Laterality: Left;   I & D EXTREMITY Left 10/14/2012   Procedure: incision and drainage left forearm;  Surgeon: Linna Hoff, MD;  Location: [place];  Service: Orthopedics;  Laterality: Left;   I & D EXTREMITY Left 10/16/2012   Procedure: IRRIGATION AND DEBRIDEMENT LEFT FOREARM;  Surgeon: Linna Hoff, MD;  Location: [place];  Service: Orthopedics;  Laterality: Left;   I & D EXTREMITY Left 10/20/2012   Procedure: INCISION AND DRAINAGE AND DEBRIDEMENT LEFT  FOREARM;  Surgeon: Linna Hoff, MD;  Location: [place];  Service: Orthopedics;  Laterality: Left;   I & D EXTREMITY Right 05/22/2019   Procedure:  IRRIGATION AND DEBRIDEMENT RIGHT ARM;  Surgeon: [name], MD;  Location: [place];  Service: Orthopedics;  Laterality: Right;   INCISION AND DRAINAGE OF WOUND Bilateral 11/29/2017  Procedure: DEBRIDEMENT BILATERAL FEET, DEBRIDEMENT LEFT ANKLE, AND APPLY WOUND VAC;  Surgeon: [name], MD;  Location: [place];  Service: Orthopedics;  Laterality: Bilateral;   INCISION AND DRAINAGE OF WOUND Bilateral 09/01/2019   Procedure: IRRIGATION AND DEBRIDEMENT FOREARM WOUNDS;  Surgeon: Cindra Presume, MD;  Location: [place];  Service: Plastics;  Laterality: Bilateral;   IRRIGATION AND DEBRIDEMENT ABSCESS     Hx: of left arm abscess related to drug use    TEE WITHOUT CARDIOVERSION N/A 11/28/2017   Procedure: TRANSESOPHAGEAL ECHOCARDIOGRAM (TEE);  Surgeon: Sanda Klein, MD;  Location: [place];  Service: Cardiovascular;  Laterality: N/A;   TEE WITHOUT CARDIOVERSION N/A 05/12/2019   Procedure: TRANSESOPHAGEAL ECHOCARDIOGRAM (TEE);  Surgeon: Skeet Latch, MD;  Location: [place];  Service: Cardiovascular;  Laterality: N/A;     Social History:   reports that he has been smoking cigarettes. He has a 25.00 pack-year smoking history. He has never used smokeless tobacco. He reports previous drug use. Drugs: Cocaine, IV, and Heroin. He reports that he does not drink alcohol.   Family History:  His family history includes Diabetes in his father; Heart disease in his father; Mental illness in his sister. There is no history of Cancer.   Allergies No Known Allergies   Home Medications  Prior to Admission medications   Medication Sig Start Date End Date Taking? Authorizing Provider  Accu-Chek Softclix Lancets lancets USE TO CHECK BLOOD SUGAR THREE TIMES DAILY E11.65 01/14/20 01/13/21 Yes Charlott Rakes, MD  Blood Glucose Monitoring Suppl (ACCU-CHEK GUIDE ME) w/Device KIT Use to check blood sugar TID. E11.65 01/14/20  Yes Newlin, Enobong, MD  Blood Glucose Monitoring Suppl (ACCU-CHEK GUIDE) w/Device  KIT USE TO CHECK BLOOD SUGAR THREE TIMES DAILY 01/14/20 01/13/21 Yes Newlin, Enobong, MD  glucose blood test strip USE TO CHECK BLOOD SUGAR THREE TIMES DAILY E11.65 01/14/20 01/13/21 Yes Newlin, Charlane Ferretti, MD  insulin glargine (LANTUS SOLOSTAR) 100 UNIT/ML Solostar Pen Inject 40 Units into the skin 2 (two) times daily. 06/23/20  Yes Newlin, Charlane Ferretti, MD  Insulin Pen Needle (TRUEPLUS 5-BEVEL PEN NEEDLES) 32G X 4 MM MISC Use as instructed to inject insulin. 11/27/19  Yes Charlott Rakes, MD  Insulin Pen Needle 31G X 5 MM MISC USE AS INSTRUCTED TO INJECT INSULIN. 11/27/19 11/26/20 Yes Charlott Rakes, MD  SUBOXONE 8-2 MG FILM SMARTSIG:1 Strip(s) By Mouth 3 Times Daily 07/14/20   [provider]    The patient is critically ill with multiple organ systems failure and requires high complexity decision making for assessment and support, frequent evaluation and titration of therapies, application of advanced monitoring technologies and extensive interpretation of multiple databases. Critical Care Time devoted to patient care services described in this note independent of APP/resident time (if applicable)  is 32 minutes.   Sherrilyn Rist MD [facility] Personal pager: See Amion If unanswered, please page CCM On-call: [phone]

## 2020-10-05 NOTE — Procedures (Signed)
Central Venous Catheter Insertion Procedure Note  Luke [name]  [id number]  1980-12-09  Date:10/05/20  Time:5:37 PM   Provider Performing:Raenette Sakata Alfredo Martinez   Procedure: Insertion of Non-tunneled Central Venous [phone]) with US guidance [id number])   Indication(s) Difficult access  Consent Risks of the procedure as well as the alternatives and risks of each were explained to the patient and/or caregiver.  Consent for the procedure was obtained and is signed in the bedside chart  Anesthesia Topical only with 1% lidocaine   Timeout Verified patient identification, verified procedure, site/side was marked, verified correct patient position, special equipment/implants available, medications/allergies/relevant history reviewed, required imaging and test results available.  Sterile Technique Maximal sterile technique including full sterile barrier drape, hand hygiene, sterile gown, sterile gloves, mask, hair covering, sterile ultrasound probe cover (if used).  Procedure Description Area of catheter insertion was cleaned with chlorhexidine and draped in sterile fashion.  With real-time ultrasound guidance a central venous catheter was placed into the left internal jugular vein. Nonpulsatile blood flow and easy flushing noted in all ports.  The catheter was sutured in place and sterile dressing applied.    Guidewire in L IJ  Complications/Tolerance None; patient tolerated the procedure well. Chest X-ray is ordered to verify placement for internal jugular or subclavian cannulation.   Chest x-ray is not ordered for femoral cannulation.  EBL Minimal  Specimen(s) None  Noe Gens, MSN, APRN, NP-C, AGACNP-BC [facility] 10/05/2020, 5:38 PM   Please see [email or web address] for pager details.   From 7A-7P if no response, please call [phone] After hours, please call ELink [phone]

## 2020-10-05 NOTE — Progress Notes (Signed)
Date and time results received: 10/05/20 0320  Test: Capillary Blood Glucose   Critical Value: 327  Name of Provider Notified: E-link   Orders Received? Or Actions Taken?: Awaiting orders, Will continue to monitor patient

## 2020-10-05 NOTE — Progress Notes (Signed)
Gold colored wedding band given to wife to take home.

## 2020-10-05 NOTE — Progress Notes (Signed)
Per MD, phase 2 of ICU glycemic control not to be reinstated, despite protocol parameters being met. Long acting insulin to be ordered

## 2020-10-06 ENCOUNTER — Inpatient Hospital Stay (HOSPITAL_COMMUNITY): Payer: Medicaid Other

## 2020-10-06 DIAGNOSIS — F1910 Other psychoactive substance abuse, uncomplicated: Secondary | ICD-10-CM | POA: Diagnosis not present

## 2020-10-06 DIAGNOSIS — E0810 Diabetes mellitus due to underlying condition with ketoacidosis without coma: Secondary | ICD-10-CM | POA: Diagnosis not present

## 2020-10-06 DIAGNOSIS — J690 Pneumonitis due to inhalation of food and vomit: Secondary | ICD-10-CM | POA: Diagnosis not present

## 2020-10-06 LAB — COMPREHENSIVE METABOLIC PANEL
ALT: 23 U/L (ref 0–44)
AST: 36 U/L (ref 15–41)
Albumin: 2.5 g/dL — ABNORMAL LOW (ref 3.5–5.0)
Alkaline Phosphatase: 73 U/L (ref 38–126)
Anion gap: 9 (ref 5–15)
BUN: 16 mg/dL (ref 6–20)
CO2: 25 mmol/L (ref 22–32)
Calcium: 8.1 mg/dL — ABNORMAL LOW (ref 8.9–10.3)
Chloride: 107 mmol/L (ref 98–111)
Creatinine, Ser: 1.13 mg/dL (ref 0.61–1.24)
GFR, Estimated: >60 mL/min (ref >60)
Glucose, Bld: 255 mg/dL — ABNORMAL HIGH (ref 70–99)
Potassium: 3.7 mmol/L (ref 3.5–5.1)
Sodium: 141 mmol/L (ref 135–145)
Total Bilirubin: 0.3 mg/dL (ref 0.3–1.2)
Total Protein: 5.6 g/dL — ABNORMAL LOW (ref 6.5–8.1)

## 2020-10-06 LAB — CBC WITH DIFFERENTIAL/PLATELET
Abs Immature Granulocytes: 0.04 10*3/uL (ref 0.00–0.07)
Basophils Absolute: 0.1 10*3/uL (ref 0.0–0.1)
Basophils Relative: 1 %
Eosinophils Absolute: 0.3 10*3/uL (ref 0.0–0.5)
Eosinophils Relative: 3 %
HCT: 28.3 % — ABNORMAL LOW (ref 39.0–52.0)
Hemoglobin: 9.5 g/dL — ABNORMAL LOW (ref 13.0–17.0)
Immature Granulocytes: 0 %
Lymphocytes Relative: 29 %
Lymphs Abs: 2.7 10*3/uL (ref 0.7–4.0)
MCH: 29.1 pg (ref 26.0–34.0)
MCHC: 33.6 g/dL (ref 30.0–36.0)
MCV: 86.8 fL (ref 80.0–100.0)
Monocytes Absolute: 1.2 10*3/uL — ABNORMAL HIGH (ref 0.1–1.0)
Monocytes Relative: 13 %
Neutro Abs: 4.9 10*3/uL (ref 1.7–7.7)
Neutrophils Relative %: 54 %
Platelets: 257 10*3/uL (ref 150–400)
RBC: 3.26 MIL/uL — ABNORMAL LOW (ref 4.22–5.81)
RDW: 13.5 % (ref 11.5–15.5)
WBC: 9.1 10*3/uL (ref 4.0–10.5)
nRBC: 0 % (ref 0.0–0.2)

## 2020-10-06 LAB — GLUCOSE, CAPILLARY
Glucose-Capillary: 101 mg/dL — ABNORMAL HIGH (ref 70–99)
Glucose-Capillary: 138 mg/dL — ABNORMAL HIGH (ref 70–99)
Glucose-Capillary: 151 mg/dL — ABNORMAL HIGH (ref 70–99)
Glucose-Capillary: 170 mg/dL — ABNORMAL HIGH (ref 70–99)
Glucose-Capillary: 177 mg/dL — ABNORMAL HIGH (ref 70–99)
Glucose-Capillary: 221 mg/dL — ABNORMAL HIGH (ref 70–99)
Glucose-Capillary: 39 mg/dL — CL (ref 70–99)
Glucose-Capillary: 46 mg/dL — ABNORMAL LOW (ref 70–99)
Glucose-Capillary: 57 mg/dL — ABNORMAL LOW (ref 70–99)
Glucose-Capillary: 58 mg/dL — ABNORMAL LOW (ref 70–99)
Glucose-Capillary: 88 mg/dL (ref 70–99)
Glucose-Capillary: 93 mg/dL (ref 70–99)

## 2020-10-06 LAB — MAGNESIUM: Magnesium: 1.7 mg/dL (ref 1.7–2.4)

## 2020-10-06 MED ORDER — POTASSIUM CHLORIDE 10 MEQ/50ML IV SOLN
INTRAVENOUS | Status: AC
  Administered 2020-10-06: 10 meq via INTRAVENOUS
  Filled 2020-10-06: qty 50

## 2020-10-06 MED ORDER — DEXTROSE IN LACTATED RINGERS 5 % IV SOLN: INTRAVENOUS | Status: AC

## 2020-10-06 MED ORDER — METOCLOPRAMIDE HCL 5 MG/ML IJ SOLN
10.0000 mg | Freq: Four times a day (QID) | INTRAMUSCULAR | Status: DC
  Administered 2020-10-06 – 2020-10-09 (×11): 10 mg via INTRAVENOUS
  Filled 2020-10-06 (×11): qty 2

## 2020-10-06 MED ORDER — DEXTROSE 50 % IV SOLN
INTRAVENOUS | Status: AC
  Administered 2020-10-06: 50 mL
  Filled 2020-10-06: qty 50

## 2020-10-06 MED ORDER — INSULIN DETEMIR 100 UNIT/ML ~~LOC~~ SOLN
5.0000 [IU] | Freq: Two times a day (BID) | SUBCUTANEOUS | Status: DC
  Administered 2020-10-06 – 2020-10-09 (×6): 5 [IU] via SUBCUTANEOUS
  Filled 2020-10-06 (×6): qty 0.05

## 2020-10-06 MED ORDER — MAGNESIUM SULFATE 2 GM/50ML IV SOLN
2.0000 g | Freq: Once | INTRAVENOUS | Status: AC
  Administered 2020-10-06: 2 g via INTRAVENOUS
  Filled 2020-10-06: qty 50

## 2020-10-06 MED ORDER — DEXMEDETOMIDINE HCL IN NACL 400 MCG/100ML IV SOLN
0.2000 ug/kg/h | INTRAVENOUS | Status: DC
Start: 2020-10-06 — End: 2020-10-09
  Administered 2020-10-06: 1.1 ug/kg/h via INTRAVENOUS
  Administered 2020-10-06: 1.2 ug/kg/h via INTRAVENOUS
  Administered 2020-10-06: 1 ug/kg/h via INTRAVENOUS
  Administered 2020-10-07: 0.9 ug/kg/h via INTRAVENOUS
  Administered 2020-10-07: 1 ug/kg/h via INTRAVENOUS
  Administered 2020-10-07: 0.2 ug/kg/h via INTRAVENOUS
  Administered 2020-10-08: 0.4 ug/kg/h via INTRAVENOUS
  Administered 2020-10-08: 0.6 ug/kg/h via INTRAVENOUS
  Filled 2020-10-06 (×9): qty 100

## 2020-10-06 MED ORDER — POTASSIUM CHLORIDE 10 MEQ/50ML IV SOLN
10.0000 meq | INTRAVENOUS | Status: AC
  Administered 2020-10-06 (×3): 10 meq via INTRAVENOUS
  Filled 2020-10-06 (×3): qty 50

## 2020-10-06 MED ORDER — DEXTROSE 10 % IV SOLN: INTRAVENOUS | Status: DC

## 2020-10-06 MED ORDER — CLONAZEPAM 1 MG PO TABS
2.0000 mg | ORAL_TABLET | Freq: Two times a day (BID) | ORAL | Status: DC
  Administered 2020-10-06 – 2020-10-09 (×4): 2 mg
  Filled 2020-10-06 (×5): qty 2

## 2020-10-06 MED ORDER — BISACODYL 10 MG RE SUPP
10.0000 mg | Freq: Every day | RECTAL | Status: AC
  Administered 2020-10-06: 10 mg via RECTAL
  Filled 2020-10-06 (×2): qty 1

## 2020-10-06 MED ORDER — INSULIN DETEMIR 100 UNIT/ML ~~LOC~~ SOLN
10.0000 [IU] | Freq: Two times a day (BID) | SUBCUTANEOUS | Status: DC
  Administered 2020-10-06: 10 [IU] via SUBCUTANEOUS
  Filled 2020-10-06 (×2): qty 0.1

## 2020-10-06 NOTE — Progress Notes (Signed)
eLink Physician-Brief Progress Note Patient Name: Luke [name] DOB: 09-13-1980 MRN: [id number]   Date of Service  10/06/2020  HPI/Events of Note  Patient vomited while being bathed and the bedside RN is concerned about possible aspiration.  eICU Interventions  Stat portable CXR ordered to r/o aspiration pneumonia.        Kerry Kass Kyrsten Deleeuw 10/06/2020, 6:10 AM

## 2020-10-06 NOTE — Progress Notes (Signed)
[facility] ADULT ICU REPLACEMENT PROTOCOL   The patient does apply for the [facility] Adult ICU Electrolyte Replacment Protocol based on the criteria listed below:   1.Exclusion criteria: TCTS patients, ECMO patients and Hypothermia Protocol, and   Dialysis patients 2. Is GFR >/= 30 ml/min? Yes.    Patient's GFR today is >60 3. Is SCr </= 2? Yes.   Patient's SCr is 1.13 mg/dL 4. Did SCr increase >/= 0.5 in 24 hours? No. 5.Pt's weight >40kg  Yes.   6. Abnormal electrolyte(s): K+ 3.7, mag 1.7  7. Electrolytes replaced per protocol 8.  Call MD STAT for K+ </= 2.5, Phos </= 1, or Mag </= 1 Physician:  n/a   Darlys Gales 10/06/2020 6:08 AM

## 2020-10-06 NOTE — Progress Notes (Signed)
NAME:  Luke [name], MRN:  [id number], DOB:  1980-04-04, LOS: 4 ADMISSION DATE:  10/02/2020, CONSULTATION DATE: 10/02/2020 REFERRING MD: Dr. Florina Ou, CHIEF COMPLAINT: Agitation and combativeness requiring intubation  History of Present Illness:  Patient is a 40 year old brought to the emergency room extremely agitated by patient's girlfriend.  He was extremely uncooperative agitated and threatening to staff.  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  Attempts to sedate the patient in the emergency room included IM Haldol, Versed, Geodon, Benadryl and ketamine prior to intubation.  Sedation was hampered due to IV access.  Intubation was difficult requiring anesthesia.  It is unknown what his drug ingestion might have been.  Urine drug screen was positive for amphetamines benzodiazepine and THC.  Patient did vomit during initial intubation attempts.   He also on this admission is noted to have a creatinine above his baseline now at 1.93 previously 1.5.  Some elevation in his baseline creatinine may be due to dehydration and elevated glucose.  So appears to be in DKA.  May have artificially elevated that value.  Post intubation blood gas looked normal.  His anion gap is 21.CPK in the emergency room was 666.  Glucose is 823.  Patient's endocarditis was felt secondary to Streptococcus mitis for which he was treated for a 3 weeks with ampicillin and gentamicin then off label with oritavancin.  He has had a previous ER admission for congestive heart failure secondary to his mitral regurgitation.  Last echocardiogram ejection fraction is 70-75%.   Pertinent  Medical History    Anemia     Anxiety  Dx 2008   Cellulitis     Chronic kidney disease     Diabetes mellitus without complication ([place]) Dx 2800   DKA (diabetic ketoacidoses) 07/21/2017   Endocarditis mitral 04/2019   Endocarditis     GERD (gastroesophageal reflux disease) Dx  2008   Headache(784.0)     Heart murmur     Hepatitis C     Hypertension     Hypothyroidism     MRSA infection     Pneumonia     Seizures ([place]) 2011    x 2 in lifetime. on Dilantin for a while.   Sepsis ([place])     Substance abuse ([place]) 2013    heroin use, multiple relapses   Type 1 diabetes ([place])       Significant Hospital Events: Including procedures, antibiotic start and stop dates in addition to other pertinent events   Intubation and admission 10/02/2020 7/19 Stable, still with significant agitation 7/20 minimal vent requirement, still gets very agitated with weaning sedatives  Interim History / Subjective:  No overnight events Agitation with weaning sedation Minimal vent support  Objective   Blood pressure 135/74, pulse 88, temperature 99.8 F (37.7 C), temperature source Axillary, resp. rate (!) 23, height _0  (1.753 m), weight 84.9 kg, SpO2 96 %.    Vent Mode: PSV;CPAP FiO2 (%):  [30 %] 30 % Set Rate:  [22 bmp] 22 bmp Vt Set:  [420 mL] 420 mL PEEP:  [5 cmH20] 5 cmH20 Pressure Support:  [8 cmH20] 8 cmH20 Plateau Pressure:  [12 cmH20-21 cmH20] 18 cmH20   Intake/Output Summary (Last 24 hours) at 10/06/2020 0857 Last data filed at 10/06/2020 0644 Gross per 24 hour  Intake 5130.32 ml  Output 1525 ml  Net 3605.32 ml   Filed Weights   10/04/20 0417 10/05/20 0500 10/06/20 0500  Weight: 81.1 kg 83.5 kg 84.9 kg    Examination: General: Sedated, appears comfortable at present HENT: Moist oral mucosa, endotracheal tube in place  lungs: Decreased breath sounds bilaterally Cardiovascular: S1-S2 appreciated Abdomen: Bowel sounds appreciated Extremities: Bilateral upper extremity scarring Neuro: Remains sedated GU: fair output  Resolved Hospital Problem list   NA  Assessment & Plan:  1.  Polysubstance abuse 2.  Agitation -On fentanyl, propofol, Precedex -Attempting to wean as tolerated -Added Klonopin 1 p.o. twice daily and Seroquel  DKA -DKA resolved -On  SSI -Episodes of hypoglycemia noted, will reduce Levemir to 10 subcu twice daily  Acute kidney injury on chronic kidney disease stage IIIb -Resolving -Avoid nephrotoxic's  History of hepatitis C -Outpatient follow-up  Heroin and methamphetamine drug abuse -Counseling when appropriate  Aspiration pneumonitis -X-ray with bilateral infiltrative process, has remained stable -Leukocytosis improving -On Unasyn  The only barrier to aggressively weaning and trying to extubate is patient's persistent agitation and requirement of high doses of sedatives  Best Practice (right click and "Reselect all SmartList Selections" daily)   Diet/type: tubefeeds DVT prophylaxis: LMWH GI prophylaxis: H2B Lines: N/A Foley:  N/A Code Status:  full code Last date of multidisciplinary goals of care discussion [patient intubated and sedated]  Labs   CBC: Recent Labs  Lab 10/02/20 0240 10/03/20 0238 10/03/20 1246 10/05/20 0449 10/06/20 0245  WBC 12.7* SPECIMEN CONTAMINATED, UNABLE TO PERFORM TEST(S). 13.9* 8.1 9.1  NEUTROABS 9.6*  --   --  5.4 4.9  HGB 13.4 SPECIMEN CONTAMINATED, UNABLE TO PERFORM TEST(S). 10.0* 9.3* 9.5*  HCT 38.9* SPECIMEN CONTAMINATED, UNABLE TO PERFORM TEST(S). 29.7* 27.9* 28.3*  MCV 83.7 SPECIMEN CONTAMINATED, UNABLE TO PERFORM TEST(S). 85.6 86.6 86.8  PLT 376 SPECIMEN CONTAMINATED, UNABLE TO PERFORM TEST(S). 272 240 099    Basic Metabolic Panel: Recent Labs  Lab 10/03/20 0238 10/03/20 1111 10/03/20 1759 10/04/20 0224 10/05/20 0449 10/06/20 0245  NA 143 142 140 140 139 141  K 3.5 3.5 3.4* 3.6 3.8 3.7  CL 111 111 109 107 106 107  CO2 _0 GLUCOSE 65* 102* 99 263* 326* 255*  BUN 26* _1 CREATININE 1.34* 1.28* 1.15 1.17 1.18 1.13  CALCIUM 8.3* 8.2* 8.3* 8.2* 8.0* 8.1*  MG 1.9  --   --   --   --  1.7  PHOS 4.2  --   --   --   --   --    GFR: Estimated Creatinine Clearance: 93.9 mL/min (by C-G formula based on SCr of 1.13  mg/dL). Recent Labs  Lab 10/03/20 0238 10/03/20 1246 10/05/20 0449 10/06/20 0245  WBC SPECIMEN CONTAMINATED, UNABLE TO PERFORM TEST(S). 13.9* 8.1 9.1    Liver Function Tests: Recent Labs  Lab 10/06/20 0245  AST 36  ALT 23  ALKPHOS 73  BILITOT 0.3  PROT 5.6*  ALBUMIN 2.5*   No results for input(s): LIPASE, AMYLASE in the last 168 hours. No results for input(s): AMMONIA in the last 168 hours.  ABG    Component Value Date/Time   PHART 7.345 (L) 10/02/2020 0850   PCO2ART 44.1 10/02/2020 0850   PO2ART 91.4 10/02/2020 0850   HCO3 23.4 10/02/2020 0850   TCO2 17 (L) 03/19/2019 0042   ACIDBASEDEF 1.8 10/02/2020 0850   O2SAT 96.4 10/02/2020 0850     Coagulation Profile: No results for input(s): INR, PROTIME in the last 168 hours.  Cardiac Enzymes: Recent Labs  Lab 10/02/20 0240 10/05/20 0449  CKTOTAL  666* 599*    HbA1C: Hemoglobin A1C  Date/Time Value Ref Range Status  04/11/2014 12:54 AM 11.3 (H) 4.2 - 6.3 % Final    Comment:    The American Diabetes Association recommends that a primary goal of therapy should be <7% and that physicians should reevaluate the treatment regimen in patients with HbA1c values consistently >8%.    HbA1c POC (<> result, manual entry)  Date/Time Value Ref Range Status  03/24/2018 09:45 AM >15 4.0 - 5.6 % Final   Hgb A1c MFr Bld  Date/Time Value Ref Range Status  03/04/2020 03:44 PM 11.6 (H) 4.8 - 5.6 % Final    Comment:             Prediabetes: 5.7 - 6.4          Diabetes: >6.4          Glycemic control for adults with diabetes: <7.0   08/27/2019 09:42 PM 10.8 (H) 4.8 - 5.6 % Final    Comment:    (NOTE) Pre diabetes:          5.7%-6.4%  Diabetes:              >6.4%  Glycemic control for   <7.0% adults with diabetes     CBG: Recent Labs  Lab 10/06/20 0327 10/06/20 0330 10/06/20 0346 10/06/20 0404 10/06/20 0749  GLUCAP 57* 58* 177* 138* 221*    Review of Systems:   Unable to obtain due to intubation and  sedation  Past Medical History:  He,  has a past medical history of Anemia, Anxiety ( Dx 2008), Cellulitis, Chronic kidney disease, Diabetes mellitus without complication ([place]) (Dx 5809), DKA (diabetic ketoacidoses) (07/21/2017), Endocarditis (04/2019), Endocarditis, GERD (gastroesophageal reflux disease) (Dx 2008), Headache(784.0), Heart murmur, Hepatitis C, Hypertension, Hypothyroidism, MRSA infection, Necrotizing fasciitis of multiple sites [facility]), Pneumonia, Seizures ([place]) (2011), Sepsis ([place]), Substance abuse ([place]) (2013), and Type 1 diabetes ([place]).   Surgical History:   Past Surgical History:  Procedure Laterality Date   AMPUTATION Left 09/05/2018   Procedure: LEFT GREAT TOE AMPUTATION;  Surgeon: [name], MD;  Location: [place];  Service: Orthopedics;  Laterality: Left;   APPLICATION OF A-CELL OF EXTREMITY Bilateral 09/01/2019   Procedure: APPLICATION OF MESHED PRIMATRIX AG OF EXTREMITY;  Surgeon: Cindra Presume, MD;  Location: [place];  Service: Plastics;  Laterality: Bilateral;   I & D EXTREMITY Left 10/11/2012   Procedure: IRRIGATION AND DEBRIDEMENT ABSCESS FOREARM;  Surgeon: Linna Hoff, MD;  Location: [place];  Service: Orthopedics;  Laterality: Left;   I & D EXTREMITY Left 10/12/2012   Procedure: IRRIGATION AND DEBRIDEMENT FOREARM;  Surgeon: Linna Hoff, MD;  Location: [place];  Service: Orthopedics;  Laterality: Left;   I & D EXTREMITY Left 10/14/2012   Procedure: incision and drainage left forearm;  Surgeon: Linna Hoff, MD;  Location: [place];  Service: Orthopedics;  Laterality: Left;   I & D EXTREMITY Left 10/16/2012   Procedure: IRRIGATION AND DEBRIDEMENT LEFT FOREARM;  Surgeon: Linna Hoff, MD;  Location: [place];  Service: Orthopedics;  Laterality: Left;   I & D EXTREMITY Left 10/20/2012   Procedure: INCISION AND DRAINAGE AND DEBRIDEMENT LEFT  FOREARM;  Surgeon: Linna Hoff, MD;  Location: [place];  Service: Orthopedics;  Laterality: Left;   I & D EXTREMITY Right 05/22/2019    Procedure: IRRIGATION AND DEBRIDEMENT RIGHT ARM;  Surgeon: [name], MD;  Location: [place];  Service: Orthopedics;  Laterality: Right;  INCISION AND DRAINAGE OF WOUND Bilateral 11/29/2017   Procedure: DEBRIDEMENT BILATERAL FEET, DEBRIDEMENT LEFT ANKLE, AND APPLY WOUND VAC;  Surgeon: [name], MD;  Location: [place];  Service: Orthopedics;  Laterality: Bilateral;   INCISION AND DRAINAGE OF WOUND Bilateral 09/01/2019   Procedure: IRRIGATION AND DEBRIDEMENT FOREARM WOUNDS;  Surgeon: Cindra Presume, MD;  Location: [place];  Service: Plastics;  Laterality: Bilateral;   IRRIGATION AND DEBRIDEMENT ABSCESS     Hx: of left arm abscess related to drug use    TEE WITHOUT CARDIOVERSION N/A 11/28/2017   Procedure: TRANSESOPHAGEAL ECHOCARDIOGRAM (TEE);  Surgeon: Sanda Klein, MD;  Location: [place];  Service: Cardiovascular;  Laterality: N/A;   TEE WITHOUT CARDIOVERSION N/A 05/12/2019   Procedure: TRANSESOPHAGEAL ECHOCARDIOGRAM (TEE);  Surgeon: Skeet Latch, MD;  Location: [place];  Service: Cardiovascular;  Laterality: N/A;     Social History:   reports that he has been smoking cigarettes. He has a 25.00 pack-year smoking history. He has never used smokeless tobacco. He reports previous drug use. Drugs: Cocaine, IV, and Heroin. He reports that he does not drink alcohol.   Family History:  His family history includes Diabetes in his father; Heart disease in his father; Mental illness in his sister. There is no history of Cancer.   Allergies No Known Allergies   Home Medications  Prior to Admission medications   Medication Sig Start Date End Date Taking? Authorizing Provider  Accu-Chek Softclix Lancets lancets USE TO CHECK BLOOD SUGAR THREE TIMES DAILY E11.65 01/14/20 01/13/21 Yes Charlott Rakes, MD  Blood Glucose Monitoring Suppl (ACCU-CHEK GUIDE ME) w/Device KIT Use to check blood sugar TID. E11.65 01/14/20  Yes Newlin, Enobong, MD  Blood Glucose Monitoring Suppl (ACCU-CHEK  GUIDE) w/Device KIT USE TO CHECK BLOOD SUGAR THREE TIMES DAILY 01/14/20 01/13/21 Yes Newlin, Enobong, MD  glucose blood test strip USE TO CHECK BLOOD SUGAR THREE TIMES DAILY E11.65 01/14/20 01/13/21 Yes Newlin, Charlane Ferretti, MD  insulin glargine (LANTUS SOLOSTAR) 100 UNIT/ML Solostar Pen Inject 40 Units into the skin 2 (two) times daily. 06/23/20  Yes Newlin, Charlane Ferretti, MD  Insulin Pen Needle (TRUEPLUS 5-BEVEL PEN NEEDLES) 32G X 4 MM MISC Use as instructed to inject insulin. 11/27/19  Yes Charlott Rakes, MD  Insulin Pen Needle 31G X 5 MM MISC USE AS INSTRUCTED TO INJECT INSULIN. 11/27/19 11/26/20 Yes Charlott Rakes, MD  SUBOXONE 8-2 MG FILM SMARTSIG:1 Strip(s) By Mouth 3 Times Daily 07/14/20   [provider]    The patient is critically ill with multiple organ systems failure and requires high complexity decision making for assessment and support, frequent evaluation and titration of therapies, application of advanced monitoring technologies and extensive interpretation of multiple databases. Critical Care Time devoted to patient care services described in this note independent of APP/resident time (if applicable)  is 32 minutes.   Sherrilyn Rist MD [place] [facility] Personal pager: See Amion If unanswered, please page

## 2020-10-06 NOTE — Progress Notes (Signed)
During morning shift assessment, patient's OG tube was at 61cm but should've been at 51cm. Advanced the tube and got an order for an abdominal xray. Tube feeds on hold since last night.   Renae Gloss, RN 10/06/2020 9:43 AM

## 2020-10-06 NOTE — Progress Notes (Signed)
eLink Physician-Brief Progress Note Patient Name: Luke [name] DOB: 08/11/1980 MRN: [id number]   Date of Service  10/06/2020  HPI/Events of Note  Hypoglycemia - Blood glucose = 46. Already given D50 by bedside nurse. Patient is already on E5 LR at 50 mL/hour.  eICU Interventions  Plan: D10W to run IV at 50 mL/hour Decrease Levemir from 10 units to 5 units [place] BID.     Intervention Category Major Interventions: Other:  Lysle Dingwall 10/06/2020, 9:43 PM

## 2020-10-06 NOTE — Progress Notes (Signed)
CRITICAL VALUE STICKER  CRITICAL VALUE: CBG 51   DATE & TIME NOTIFIED:  2014  MESSENGER Theodosia Quay RN   MD NOTIFIED:  Dr. Oletta Darter   TIME OF NOTIFICATION: 2130  RESPONSE:   New orders

## 2020-10-07 ENCOUNTER — Inpatient Hospital Stay (HOSPITAL_COMMUNITY): Payer: Medicaid Other

## 2020-10-07 DIAGNOSIS — E0810 Diabetes mellitus due to underlying condition with ketoacidosis without coma: Secondary | ICD-10-CM | POA: Diagnosis not present

## 2020-10-07 DIAGNOSIS — E1310 Other specified diabetes mellitus with ketoacidosis without coma: Secondary | ICD-10-CM | POA: Diagnosis not present

## 2020-10-07 DIAGNOSIS — N179 Acute kidney failure, unspecified: Secondary | ICD-10-CM | POA: Diagnosis not present

## 2020-10-07 DIAGNOSIS — F1910 Other psychoactive substance abuse, uncomplicated: Secondary | ICD-10-CM | POA: Diagnosis not present

## 2020-10-07 LAB — CBC WITH DIFFERENTIAL/PLATELET
Abs Immature Granulocytes: 0.04 10*3/uL (ref 0.00–0.07)
Basophils Absolute: 0 10*3/uL (ref 0.0–0.1)
Basophils Relative: 0 %
Eosinophils Absolute: 0.3 10*3/uL (ref 0.0–0.5)
Eosinophils Relative: 3 %
HCT: 27.7 % — ABNORMAL LOW (ref 39.0–52.0)
Hemoglobin: 9.4 g/dL — ABNORMAL LOW (ref 13.0–17.0)
Immature Granulocytes: 0 %
Lymphocytes Relative: 32 %
Lymphs Abs: 3.1 10*3/uL (ref 0.7–4.0)
MCH: 29.4 pg (ref 26.0–34.0)
MCHC: 33.9 g/dL (ref 30.0–36.0)
MCV: 86.6 fL (ref 80.0–100.0)
Monocytes Absolute: 1.3 10*3/uL — ABNORMAL HIGH (ref 0.1–1.0)
Monocytes Relative: 13 %
Neutro Abs: 5.1 10*3/uL (ref 1.7–7.7)
Neutrophils Relative %: 52 %
Platelets: 275 10*3/uL (ref 150–400)
RBC: 3.2 MIL/uL — ABNORMAL LOW (ref 4.22–5.81)
RDW: 13.2 % (ref 11.5–15.5)
WBC: 9.9 10*3/uL (ref 4.0–10.5)
nRBC: 0 % (ref 0.0–0.2)

## 2020-10-07 LAB — BASIC METABOLIC PANEL
Anion gap: 10 (ref 5–15)
BUN: 15 mg/dL (ref 6–20)
CO2: 27 mmol/L (ref 22–32)
Calcium: 8.4 mg/dL — ABNORMAL LOW (ref 8.9–10.3)
Chloride: 105 mmol/L (ref 98–111)
Creatinine, Ser: 1.18 mg/dL (ref 0.61–1.24)
GFR, Estimated: >60 mL/min (ref >60)
Glucose, Bld: 177 mg/dL — ABNORMAL HIGH (ref 70–99)
Potassium: 3.7 mmol/L (ref 3.5–5.1)
Sodium: 142 mmol/L (ref 135–145)

## 2020-10-07 LAB — GLUCOSE, CAPILLARY
Glucose-Capillary: 190 mg/dL — ABNORMAL HIGH (ref 70–99)
Glucose-Capillary: 243 mg/dL — ABNORMAL HIGH (ref 70–99)
Glucose-Capillary: 145 mg/dL — ABNORMAL HIGH (ref 70–99)
Glucose-Capillary: 111 mg/dL — ABNORMAL HIGH (ref 70–99)
Glucose-Capillary: 152 mg/dL — ABNORMAL HIGH (ref 70–99)
Glucose-Capillary: 175 mg/dL — ABNORMAL HIGH (ref 70–99)

## 2020-10-07 LAB — MAGNESIUM: Magnesium: 1.9 mg/dL (ref 1.7–2.4)

## 2020-10-07 MED ORDER — POTASSIUM CHLORIDE 20 MEQ PO PACK: 40.0000 meq | PACK | Freq: Two times a day (BID) | ORAL | Status: DC

## 2020-10-07 MED ORDER — ACETAMINOPHEN 650 MG RE SUPP
650.0000 mg | RECTAL | Status: DC | PRN
  Administered 2020-10-07: 650 mg via RECTAL
  Filled 2020-10-07: qty 1

## 2020-10-07 MED ORDER — POTASSIUM CHLORIDE 20 MEQ PO PACK: 20.0000 meq | PACK | Freq: Once | ORAL | Status: DC

## 2020-10-07 MED ORDER — FENTANYL 25 MCG/HR TD PT72
1.0000 | MEDICATED_PATCH | TRANSDERMAL | Status: DC
  Administered 2020-10-07: 1 via TRANSDERMAL
  Filled 2020-10-07: qty 1

## 2020-10-07 MED ORDER — MAGNESIUM SULFATE 2 GM/50ML IV SOLN
2.0000 g | Freq: Once | INTRAVENOUS | Status: AC
  Administered 2020-10-07: 2 g via INTRAVENOUS

## 2020-10-07 MED ORDER — FENTANYL 50 MCG/HR TD PT72
1.0000 | MEDICATED_PATCH | TRANSDERMAL | Status: DC
Start: 2020-10-07 — End: 2020-10-09
  Administered 2020-10-07: 1 via TRANSDERMAL
  Filled 2020-10-07: qty 1

## 2020-10-07 MED ORDER — PROSOURCE TF PO LIQD
45.0000 mL | Freq: Two times a day (BID) | ORAL | Status: DC
  Filled 2020-10-07 (×2): qty 45

## 2020-10-07 MED ORDER — FUROSEMIDE 10 MG/ML IJ SOLN
40.0000 mg | Freq: Once | INTRAMUSCULAR | Status: AC
  Administered 2020-10-07: 40 mg via INTRAVENOUS
  Filled 2020-10-07: qty 4

## 2020-10-07 MED ORDER — LORAZEPAM 2 MG/ML IJ SOLN
INTRAMUSCULAR | Status: AC
  Filled 2020-10-07: qty 1

## 2020-10-07 MED ORDER — FENTANYL 25 MCG/HR TD PT72: 2.0000 | MEDICATED_PATCH | TRANSDERMAL | Status: DC

## 2020-10-07 MED ORDER — DEXTROSE 10 % IV SOLN: INTRAVENOUS | Status: DC

## 2020-10-07 MED ORDER — POTASSIUM CHLORIDE 20 MEQ PO PACK
40.0000 meq | PACK | Freq: Once | ORAL | Status: AC
  Administered 2020-10-07: 40 meq
  Filled 2020-10-07: qty 2

## 2020-10-07 MED ORDER — OSMOLITE 1.5 CAL PO LIQD
1000.0000 mL | ORAL | Status: DC
  Administered 2020-10-07: 1000 mL
  Filled 2020-10-07 (×3): qty 1000

## 2020-10-07 MED ORDER — LORAZEPAM 2 MG/ML IJ SOLN: 2.0000 mg | Freq: Once | INTRAMUSCULAR | Status: DC

## 2020-10-07 NOTE — Progress Notes (Addendum)
eLink Physician-Brief Progress Note Patient Name: Luke [name] DOB: 08/11/1980 MRN: [id number]   Date of Service  10/07/2020  HPI/Events of Note  Patient c/o "total body pain" PMH of substance abuse and agitation  eICU Interventions  Plan: Precedex IV infusion. (0.2 to 0.6 mcg/kg/hour). Titrate to RASS = 0. Increase to Fentanyl 50 mcg patch Q 72 hours.     Intervention Category Major Interventions: Other:  Lysle Dingwall 10/07/2020, 9:15 PM

## 2020-10-07 NOTE — Progress Notes (Addendum)
Nutrition Follow-up  DOCUMENTATION CODES:   Not applicable  INTERVENTION:  - once TF able to be resumed: Osmolite 1.2 @ 20 ml/hr to advance by 10 ml every 8 hours to reach goal rate of 55 ml/hr with 45 ml Prosource TF BID. - at goal rate, this regimen will provide 2060 kcal (96% kcal need), 105 grams protein, and 1006 ml free water. - free water per CCM.   NUTRITION DIAGNOSIS:   Inadequate oral intake related to inability to eat as evidenced by NPO status. -ongoing  GOAL:   Patient will meet greater than or equal to 90% of their needs -unmet, unable to meet  MONITOR:   Vent status, TF tolerance, Labs, Weight trends  ASSESSMENT:   40 year old male with medical history of anxiety, anemia, GERD, seizures, substance use/abuse (heroin and methamphetamines), HTN, CKD, type 1 DM, hypothyroidism, endocarditis, heart murmur, hepatitis C, and hx of necrotizing fasciitis in multiple sites. His girlfriend brought him to the ED d/t extreme agitation. He was intubated in the ED; difficult to intubate and Anesthesia performed. UDS positive for amphetamines, benzos, and THC. He vomited during initial intubation attempts.  Patient remains intubated with OGT in place.  Able to talk with RN who reports that patient had an episode of vomiting yesterday and that TF has been off since that time. Note from end of night shift yesterday indicates that patient vomited while being bathed.   Weight has fluctuated throughout admission. Used weight from 7/17 (80.3 kg) to re-estimate needs. Non-pitting edema to BUE per documentation in the flow sheet.   Patient is currently intubated on ventilator support MV: 10.5 L/min Temp (24hrs), Avg:99.4 F (37.4 C), Min:98.5 F (36.9 C), Max:101 F (38.3 C) Propofol: hanging but not infusing. BP: 132/83 and MAP: 98    Labs reviewed; CBGs: 190, 243, 145 mg/dl, Ca: 8.4 mg/dl. Medications reviewed; 100 mg colace/day, 20 mg IV pepcid/day, 40 mg IV lasix x1 dose 7/22,  sliding scale novolog, 5 units levemir BID, 2 g IV Mg sulfate x1 run 7/22, 10 mg IV reglan QID started 7/21, 17 g miralax/day, 20 mEq Klor-Con x1 dose 7/22, 40 mEq Klor-Con x1 dose 7/22, 10 mEq IV KCl x4 runs 7/21.  Drips; fentanyl @ 300 mcg/hr, precedex @ 1.1 mcg/kg/hr. IVF; D10 @ 50 ml/hr (408 kcal/24 hrs).    Diet Order:   Diet Order             Diet NPO time specified  Diet effective now                   EDUCATION NEEDS:   No education needs have been identified at this time  Skin:  Skin Assessment: Reviewed RN Assessment  Last BM:  7/22  Height:   Ht Readings from Last 1 Encounters:  10/03/20 '5\' 9"'$  (1.753 m)    Weight:   Wt Readings from Last 1 Encounters:  10/07/20 84 kg      Estimated Nutritional Needs:  Kcal:  2147 kcal Protein:  96-120 grams Fluid:  >/= 2.1 L/day     Jarome Matin, MS, RD, LDN, CNSC Inpatient Clinical Dietitian RD pager # available in AMION  After hours/weekend pager # available in [facility]

## 2020-10-07 NOTE — Procedures (Signed)
Extubation Procedure Note  Patient Details:   Name: [name] DOB: 03/02/1981 MRN: [id number]   Airway Documentation:    Vent end date: 10/07/20 Vent end time: 1629   Evaluation  O2 sats: stable throughout Complications: No apparent complications Patient did tolerate procedure well. Bilateral Breath Sounds: Clear, Diminished   Yes  Pt extubated per CCMD verbal order. Pt tolerated procedure well, now on 3L [place] with saturations of 95%. Pt was suctioned and a positive cuff leak was heard prior to extubation. No stridor heard at this time. Vitals are stable, RT will continue to monitor pt status.   Altin Sease A Thatcher [place] 10/07/2020, 4:30 PM

## 2020-10-07 NOTE — Progress Notes (Addendum)
NAME:  Luke [name], MRN:  [id number], DOB:  January 26, 1981, LOS: 5 ADMISSION DATE:  10/02/2020, CONSULTATION DATE: 10/02/2020 REFERRING MD: Dr. Florina Ou, CHIEF COMPLAINT: Agitation and combativeness requiring intubation  History of Present Illness:  Patient is a 40 year old brought to the emergency room extremely agitated by patient's girlfriend.  He was extremely uncooperative agitated and threatening to staff.  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  Attempts to sedate the patient in the emergency room included IM Haldol, Versed, Geodon, Benadryl and ketamine prior to intubation.  Sedation was hampered due to IV access.  Intubation was difficult requiring anesthesia.  It is unknown what his drug ingestion might have been.  Urine drug screen was positive for amphetamines benzodiazepine and THC.  Patient did vomit during initial intubation attempts.   He also on this admission is noted to have a creatinine above his baseline now at 1.93 previously 1.5.  Some elevation in his baseline creatinine may be due to dehydration and elevated glucose.  So appears to be in DKA.  May have artificially elevated that value.  Post intubation blood gas looked normal.  His anion gap is 21. CPK in the emergency room was 666.  Glucose is 823.  Patient's endocarditis was felt secondary to Streptococcus mitis for which he was treated for a 3 weeks with ampicillin and gentamicin then off label with oritavancin.  He has had a previous ER admission for congestive heart failure secondary to his mitral regurgitation.  Last echocardiogram ejection fraction is 70-75%.   Pertinent  Medical History    Anemia     Anxiety  Dx 2008   Cellulitis     Chronic kidney disease     Diabetes mellitus without complication ([place]) Dx 4008   DKA (diabetic ketoacidoses) 07/21/2017   Endocarditis mitral 04/2019   Endocarditis     GERD (gastroesophageal reflux disease) Dx  2008   Headache(784.0)     Heart murmur     Hepatitis C     Hypertension     Hypothyroidism     MRSA infection     Pneumonia     Seizures ([place]) 2011    x 2 in lifetime. on Dilantin for a while.   Sepsis ([name])     Substance abuse ([place]) 2013    heroin use, multiple relapses   Type 1 diabetes ([place])       Significant Hospital Events: Including procedures, antibiotic start and stop dates in addition to other pertinent events   Intubation and admission 10/02/2020 7/19 Stable, still with significant agitation 7/20 minimal vent requirement, still gets very agitated with weaning sedatives 7/22>>Weaning 30% 5/5, but remains heavily sedated, Net + 9 L since admission, Febrile  Interim History / Subjective:  Overnight became hypoglycemic, CBG of 46 started on D50 and Levamir was decreased from 10 Units BID to 5 units BID Agitation with weaning propofol now Remains on Precedex at 0.9 mch/kg/hr, Fentanyl at 175 mcg Follows some simple commands for nursing Minimal vent support>> on CPAP/PS 5/5 and 30% Net + 9.9 L, 1700 cc UO last 24 hours T max of 101, current temp 99, WBC is 9.9, HGB 9.4 ( ? Hemo dilutional) Labs reviewed K 3.7, Glucose 177, Creatinine 1.18, Mag 1.9 WBC 9.9, HGB 9.4    Objective   Blood pressure 128/67, pulse 84, temperature 99 F (37.2 C), temperature source Axillary, resp. rate (!) 22, height _0  (1.753 m), weight  84 kg, SpO2 95 %.    Vent Mode: PSV;CPAP FiO2 (%):  [30 %] 30 % Set Rate:  [22 bmp] 22 bmp Vt Set:  [420 mL] 420 mL PEEP:  [5 cmH20] 5 cmH20 Pressure Support:  [5 cmH20-8 cmH20] 5 cmH20 Plateau Pressure:  [15 cmH20-18 cmH20] 16 cmH20   Intake/Output Summary (Last 24 hours) at 10/07/2020 0858 Last data filed at 10/07/2020 0819 Gross per 24 hour  Intake 3548.5 ml  Output 2600 ml  Net 948.5 ml   Filed Weights   10/05/20 0500 10/06/20 0500 10/07/20 0500  Weight: 83.5 kg 84.9 kg 84 kg    Examination: General: Sedated, appears comfortable at  present HENT: Moist oral mucosa, endotracheal tube in place , OG tube in place lungs: Bilateral chest excursion, Decreased breath sounds bilaterally, few rhonchi Cardiovascular: S1-S2 appreciated, + Murmur Abdomen: Bowel sounds appreciated, NT, ND, Body mass index is 27.35 kg/m. Extremities: Bilateral upper extremity scarring, L great toe has been amputated Neuro: Remains sedated, MAE, eyes are open, but heavily sedated GU: Foley cath with clear amber urine  Resolved Hospital Problem list   NA  Assessment & Plan:  1.  Polysubstance abuse 2.  Agitation -On fentanyl, propofol, Precedex -Attempting to wean as tolerated -Added Klonopin 1 p.o. twice daily and Seroquel  DKA -DKA resolved -On SSI - CBG's Q 4 -Continued Episodes of hypoglycemia noted, will reduce Levemir to 5 units  subcu twice daily Plan Q 4 CBG's Wean off D10 as able  Acute kidney injury on chronic kidney disease stage IIIb -Resolving - trend BMET - Monitor UO - Avoid nephrotoxic's - Maintain renal perfusion  History of hepatitis C -Outpatient follow-up  Heroin and methamphetamine drug abuse -Counseling when appropriate - Monitor for withdrawal  Aspiration pneumonitis Febrile CXR pending WBC 9.9 Weaning 30%, 5/5 -X-ray with bilateral infiltrative process, has remained stable -Leukocytosis >> today's labs are pending Plan - Trend fever Curve and WBC - Continue  Unasyn - Culture as is clinically indicated - Extubate once sedation has been weaned - Will replete Mag ( 1.9)  Anemia HGB 9.9 Plan Will check Iron levels and ferritin  The only barrier to trying to extubate is patient's persistent agitation and requirement of high doses of sedatives. Plan 7/22 is to wean sedating medications and extubate once sedation has been weaned  Best Practice (right click and "Reselect all SmartList Selections" daily)   Diet/type: NPO>> off , currently on D10 DVT prophylaxis: LMWH GI prophylaxis: H2B Lines:  N/A Foley:  N/A Code Status:  full code Last date of multidisciplinary goals of care discussion [patient intubated and sedated]  Labs   CBC: Recent Labs  Lab 10/02/20 0240 10/03/20 0238 10/03/20 1246 10/05/20 0449 10/06/20 0245  WBC 12.7* SPECIMEN CONTAMINATED, UNABLE TO PERFORM TEST(S). 13.9* 8.1 9.1  NEUTROABS 9.6*  --   --  5.4 4.9  HGB 13.4 SPECIMEN CONTAMINATED, UNABLE TO PERFORM TEST(S). 10.0* 9.3* 9.5*  HCT 38.9* SPECIMEN CONTAMINATED, UNABLE TO PERFORM TEST(S). 29.7* 27.9* 28.3*  MCV 83.7 SPECIMEN CONTAMINATED, UNABLE TO PERFORM TEST(S). 85.6 86.6 86.8  PLT 376 SPECIMEN CONTAMINATED, UNABLE TO PERFORM TEST(S). 272 240 660    Basic Metabolic Panel: Recent Labs  Lab 10/03/20 0238 10/03/20 1111 10/03/20 1759 10/04/20 0224 10/05/20 0449 10/06/20 0245  NA 143 142 140 140 139 141  K 3.5 3.5 3.4* 3.6 3.8 3.7  CL 111 111 109 107 106 107  CO2 _0 GLUCOSE 65* 102* 99 263* 326* 255*  BUN 26* _0 CREATININE 1.34* 1.28* 1.15 1.17 1.18 1.13  CALCIUM 8.3* 8.2* 8.3* 8.2* 8.0* 8.1*  MG 1.9  --   --   --   --  1.7  PHOS 4.2  --   --   --   --   --    GFR: Estimated Creatinine Clearance: 86.9 mL/min (by C-G formula based on SCr of 1.13 mg/dL). Recent Labs  Lab 10/03/20 0238 10/03/20 1246 10/05/20 0449 10/06/20 0245  WBC SPECIMEN CONTAMINATED, UNABLE TO PERFORM TEST(S). 13.9* 8.1 9.1    Liver Function Tests: Recent Labs  Lab 10/06/20 0245  AST 36  ALT 23  ALKPHOS 73  BILITOT 0.3  PROT 5.6*  ALBUMIN 2.5*   No results for input(s): LIPASE, AMYLASE in the last 168 hours. No results for input(s): AMMONIA in the last 168 hours.  ABG    Component Value Date/Time   PHART 7.345 (L) 10/02/2020 0850   PCO2ART 44.1 10/02/2020 0850   PO2ART 91.4 10/02/2020 0850   HCO3 23.4 10/02/2020 0850   TCO2 17 (L) 03/19/2019 0042   ACIDBASEDEF 1.8 10/02/2020 0850   O2SAT 96.4 10/02/2020 0850     Coagulation Profile: No results for input(s): INR,  PROTIME in the last 168 hours.  Cardiac Enzymes: Recent Labs  Lab 10/02/20 0240 10/05/20 0449  CKTOTAL 666* 599*    HbA1C: Hemoglobin A1C  Date/Time Value Ref Range Status  04/11/2014 12:54 AM 11.3 (H) 4.2 - 6.3 % Final    Comment:    The American Diabetes Association recommends that a primary goal of therapy should be <7% and that physicians should reevaluate the treatment regimen in patients with HbA1c values consistently >8%.    HbA1c POC (<> result, manual entry)  Date/Time Value Ref Range Status  03/24/2018 09:45 AM >15 4.0 - 5.6 % Final   Hgb A1c MFr Bld  Date/Time Value Ref Range Status  03/04/2020 03:44 PM 11.6 (H) 4.8 - 5.6 % Final    Comment:             Prediabetes: 5.7 - 6.4          Diabetes: >6.4          Glycemic control for adults with diabetes: <7.0   08/27/2019 09:42 PM 10.8 (H) 4.8 - 5.6 % Final    Comment:    (NOTE) Pre diabetes:          5.7%-6.4%  Diabetes:              >6.4%  Glycemic control for   <7.0% adults with diabetes     CBG: Recent Labs  Lab 10/06/20 2014 10/06/20 2043 10/06/20 2356 10/07/20 0250 10/07/20 0745  GLUCAP 46* 93 151* 190* 243*   Allergies No Known Allergies   Home Medications  Prior to Admission medications   Medication Sig Start Date End Date Taking? Authorizing Provider  Accu-Chek Softclix Lancets lancets USE TO CHECK BLOOD SUGAR THREE TIMES DAILY E11.65 01/14/20 01/13/21 Yes Charlott Rakes, MD  Blood Glucose Monitoring Suppl (ACCU-CHEK GUIDE ME) w/Device KIT Use to check blood sugar TID. E11.65 01/14/20  Yes Newlin, Enobong, MD  Blood Glucose Monitoring Suppl (ACCU-CHEK GUIDE) w/Device KIT USE TO CHECK BLOOD SUGAR THREE TIMES DAILY 01/14/20 01/13/21 Yes Newlin, Charlane Ferretti, MD  glucose blood test strip USE TO CHECK BLOOD SUGAR THREE TIMES DAILY E11.65 01/14/20 01/13/21 Yes Newlin, Charlane Ferretti, MD  insulin glargine (LANTUS SOLOSTAR) 100 UNIT/ML Solostar Pen Inject 40 Units into the skin  2 (two) times daily. 06/23/20   Yes Newlin, Charlane Ferretti, MD  Insulin Pen Needle (TRUEPLUS 5-BEVEL PEN NEEDLES) 32G X 4 MM MISC Use as instructed to inject insulin. 11/27/19  Yes Charlott Rakes, MD  Insulin Pen Needle 31G X 5 MM MISC USE AS INSTRUCTED TO INJECT INSULIN. 11/27/19 11/26/20 Yes Charlott Rakes, MD  SUBOXONE 8-2 MG FILM SMARTSIG:1 Strip(s) By Mouth 3 Times Daily 07/14/20   [provider]    The patient is critically ill with multiple organ systems failure and requires high complexity decision making for assessment and support, frequent evaluation and titration of therapies, application of advanced monitoring technologies and extensive interpretation of multiple databases. Critical Care Time devoted to patient care services described in this note  is 35 minutes.   Magdalen Spatz, MSN, AGACNP-BC [place] for personal pager PCCM on call pager [phone]  10/07/2020 8:59 AM

## 2020-10-08 ENCOUNTER — Inpatient Hospital Stay (HOSPITAL_COMMUNITY): Payer: Medicaid Other

## 2020-10-08 DIAGNOSIS — E0810 Diabetes mellitus due to underlying condition with ketoacidosis without coma: Secondary | ICD-10-CM | POA: Diagnosis not present

## 2020-10-08 DIAGNOSIS — F1910 Other psychoactive substance abuse, uncomplicated: Secondary | ICD-10-CM | POA: Diagnosis not present

## 2020-10-08 LAB — CBC WITH DIFFERENTIAL/PLATELET
Abs Immature Granulocytes: 0.11 10*3/uL — ABNORMAL HIGH (ref 0.00–0.07)
Basophils Absolute: 0.1 10*3/uL (ref 0.0–0.1)
Basophils Relative: 1 %
Eosinophils Absolute: 0.1 10*3/uL (ref 0.0–0.5)
Eosinophils Relative: 1 %
HCT: 26.1 % — ABNORMAL LOW (ref 39.0–52.0)
Hemoglobin: 8.9 g/dL — ABNORMAL LOW (ref 13.0–17.0)
Immature Granulocytes: 1 %
Lymphocytes Relative: 30 %
Lymphs Abs: 3.1 10*3/uL (ref 0.7–4.0)
MCH: 29.4 pg (ref 26.0–34.0)
MCHC: 34.1 g/dL (ref 30.0–36.0)
MCV: 86.1 fL (ref 80.0–100.0)
Monocytes Absolute: 1.2 10*3/uL — ABNORMAL HIGH (ref 0.1–1.0)
Monocytes Relative: 12 %
Neutro Abs: 5.8 10*3/uL (ref 1.7–7.7)
Neutrophils Relative %: 55 %
Platelets: 294 10*3/uL (ref 150–400)
RBC: 3.03 MIL/uL — ABNORMAL LOW (ref 4.22–5.81)
RDW: 12.9 % (ref 11.5–15.5)
WBC: 10.3 10*3/uL (ref 4.0–10.5)
nRBC: 0 % (ref 0.0–0.2)

## 2020-10-08 LAB — GLUCOSE, CAPILLARY
Glucose-Capillary: 87 mg/dL (ref 70–99)
Glucose-Capillary: 73 mg/dL (ref 70–99)
Glucose-Capillary: 206 mg/dL — ABNORMAL HIGH (ref 70–99)
Glucose-Capillary: 203 mg/dL — ABNORMAL HIGH (ref 70–99)
Glucose-Capillary: 152 mg/dL — ABNORMAL HIGH (ref 70–99)

## 2020-10-08 LAB — BASIC METABOLIC PANEL
Anion gap: 8 (ref 5–15)
BUN: 23 mg/dL — ABNORMAL HIGH (ref 6–20)
CO2: 27 mmol/L (ref 22–32)
Calcium: 8.1 mg/dL — ABNORMAL LOW (ref 8.9–10.3)
Chloride: 106 mmol/L (ref 98–111)
Creatinine, Ser: 1.13 mg/dL (ref 0.61–1.24)
GFR, Estimated: >60 mL/min (ref >60)
Glucose, Bld: 75 mg/dL (ref 70–99)
Potassium: 3.1 mmol/L — ABNORMAL LOW (ref 3.5–5.1)
Sodium: 141 mmol/L (ref 135–145)

## 2020-10-08 LAB — CK: Total CK: 1577 U/L — ABNORMAL HIGH (ref 49–397)

## 2020-10-08 LAB — MAGNESIUM: Magnesium: 1.9 mg/dL (ref 1.7–2.4)

## 2020-10-08 LAB — FERRITIN: Ferritin: 195 ng/mL (ref 24–336)

## 2020-10-08 LAB — IRON AND TIBC
Iron: 21 ug/dL — ABNORMAL LOW (ref 45–182)
Saturation Ratios: 11 % — ABNORMAL LOW (ref 17.9–39.5)
TIBC: 186 ug/dL — ABNORMAL LOW (ref 250–450)
UIBC: 165 ug/dL

## 2020-10-08 MED ORDER — BUPRENORPHINE HCL-NALOXONE HCL 8-2 MG SL SUBL
1.0000 | SUBLINGUAL_TABLET | Freq: Every day | SUBLINGUAL | Status: DC
  Administered 2020-10-08 – 2020-10-09 (×2): 1 via SUBLINGUAL
  Filled 2020-10-08 (×2): qty 1

## 2020-10-08 MED ORDER — MORPHINE SULFATE ER 15 MG PO TBCR
15.0000 mg | EXTENDED_RELEASE_TABLET | Freq: Two times a day (BID) | ORAL | Status: DC
  Administered 2020-10-08 – 2020-10-09 (×3): 15 mg via ORAL
  Filled 2020-10-08 (×3): qty 1

## 2020-10-08 MED ORDER — AMOXICILLIN-POT CLAVULANATE 875-125 MG PO TABS
1.0000 | ORAL_TABLET | Freq: Two times a day (BID) | ORAL | Status: DC
  Administered 2020-10-08 – 2020-10-09 (×4): 1 via ORAL
  Filled 2020-10-08 (×4): qty 1

## 2020-10-08 MED ORDER — FOOD THICKENER (SIMPLYTHICK HONEY)
10.0000 | ORAL | Status: DC | PRN
  Filled 2020-10-08: qty 10

## 2020-10-08 MED ORDER — POTASSIUM CHLORIDE 10 MEQ/50ML IV SOLN
10.0000 meq | INTRAVENOUS | Status: AC
  Administered 2020-10-08 (×6): 10 meq via INTRAVENOUS
  Filled 2020-10-08 (×6): qty 50

## 2020-10-08 MED ORDER — FUROSEMIDE 10 MG/ML IJ SOLN
40.0000 mg | Freq: Once | INTRAMUSCULAR | Status: AC
  Administered 2020-10-08: 40 mg via INTRAVENOUS
  Filled 2020-10-08: qty 4

## 2020-10-08 MED ORDER — POTASSIUM CHLORIDE CRYS ER 20 MEQ PO TBCR
40.0000 meq | EXTENDED_RELEASE_TABLET | Freq: Once | ORAL | Status: DC
  Filled 2020-10-08 (×2): qty 2

## 2020-10-08 NOTE — Evaluation (Signed)
Clinical/Bedside Swallow Evaluation Patient Details  Name: Luke [name] MRN: [id number] Date of Birth: [birth year]  Today's Date: 10/08/2020 Time: SLP Start Time (ACUTE ONLY): [id number] SLP Stop Time (ACUTE ONLY): 1012 SLP Time Calculation (min) (ACUTE ONLY): 17 min  Past Medical History:  Past Medical History:  Diagnosis Date   Anemia    Anxiety  Dx 2008   Cellulitis    Chronic kidney disease    Diabetes mellitus without complication ([place]) Dx [id number]   DKA (diabetic ketoacidoses) 07/21/2017   Endocarditis 04/2019   Endocarditis    GERD (gastroesophageal reflux disease) Dx 2008   Headache(784.0)    Heart murmur    Hepatitis C    Hypertension    Hypothyroidism    MRSA infection    Necrotizing fasciitis of multiple sites ([place])    Pneumonia    Seizures ([place]) 2011   x 2 in lifetime. on Dilantin for a while.    Sepsis ([place])    Substance abuse ([place]) 2013   heroin use, multiple relapses   Type 1 diabetes [facility])    Past Surgical History:  Past Surgical History:  Procedure Laterality Date   AMPUTATION Left 09/05/2018   Procedure: LEFT GREAT TOE AMPUTATION;  Surgeon: [name], MD;  Location: [place];  Service: Orthopedics;  Laterality: Left;   APPLICATION OF A-CELL OF EXTREMITY Bilateral 09/01/2019   Procedure: APPLICATION OF MESHED PRIMATRIX AG OF EXTREMITY;  Surgeon: Cindra Presume, MD;  Location: [place];  Service: Plastics;  Laterality: Bilateral;   I & D EXTREMITY Left 10/11/2012   Procedure: IRRIGATION AND DEBRIDEMENT ABSCESS FOREARM;  Surgeon: Linna Hoff, MD;  Location: [place];  Service: Orthopedics;  Laterality: Left;   I & D EXTREMITY Left 10/12/2012   Procedure: IRRIGATION AND DEBRIDEMENT FOREARM;  Surgeon: Linna Hoff, MD;  Location: [place];  Service: Orthopedics;  Laterality: Left;   I & D EXTREMITY Left 10/14/2012   Procedure: incision and drainage left forearm;  Surgeon: Linna Hoff, MD;  Location: [place];  Service: Orthopedics;  Laterality: Left;   I & D EXTREMITY Left  10/16/2012   Procedure: IRRIGATION AND DEBRIDEMENT LEFT FOREARM;  Surgeon: Linna Hoff, MD;  Location: [place];  Service: Orthopedics;  Laterality: Left;   I & D EXTREMITY Left 10/20/2012   Procedure: INCISION AND DRAINAGE AND DEBRIDEMENT LEFT  FOREARM;  Surgeon: Linna Hoff, MD;  Location: [place];  Service: Orthopedics;  Laterality: Left;   I & D EXTREMITY Right 05/22/2019   Procedure: IRRIGATION AND DEBRIDEMENT RIGHT ARM;  Surgeon: [name], MD;  Location: [place];  Service: Orthopedics;  Laterality: Right;   INCISION AND DRAINAGE OF WOUND Bilateral 11/29/2017   Procedure: DEBRIDEMENT BILATERAL FEET, DEBRIDEMENT LEFT ANKLE, AND APPLY WOUND VAC;  Surgeon: [name], MD;  Location: [place];  Service: Orthopedics;  Laterality: Bilateral;   INCISION AND DRAINAGE OF WOUND Bilateral 09/01/2019   Procedure: IRRIGATION AND DEBRIDEMENT FOREARM WOUNDS;  Surgeon: Cindra Presume, MD;  Location: [place];  Service: Plastics;  Laterality: Bilateral;   IRRIGATION AND DEBRIDEMENT ABSCESS     Hx: of left arm abscess related to drug use    TEE WITHOUT CARDIOVERSION N/A 11/28/2017   Procedure: TRANSESOPHAGEAL ECHOCARDIOGRAM (TEE);  Surgeon: Sanda Klein, MD;  Location: [place];  Service: Cardiovascular;  Laterality: N/A;   TEE WITHOUT CARDIOVERSION N/A 05/12/2019   Procedure: TRANSESOPHAGEAL ECHOCARDIOGRAM (TEE);  Surgeon: Skeet Latch, MD;  Location: [place];  Service: Cardiovascular;  Laterality: N/A;   HPI:  40 year old male with medical history of anxiety, anemia, GERD, seizures, substance use/abuse (heroin and methamphetamines), HTN, CKD, type 1 DM, hypothyroidism, endocarditis, heart murmur, hepatitis C, and hx of necrotizing fasciitis in multiple sites. His girlfriend brought him to the ED d/t extreme agitation. He was intubated in the ED; difficult to intubate and Anesthesia performed. UDS positive for amphetamines, benzos, and THC. He vomited during initial intubation attempts. Patient was  intubated from 7/17-7/22.  Assessment / Plan / Recommendation Clinical Impression  Patient was seen for a bedside swallow evaluation in the setting of recent extubation.  Pt was encountered awake/alert in chair and was agreeable to this evaluation.  Pt was noted to have dysphonia and a hoarse/breathy vocal quality upon SLP arrival.  Oral mechanism examination was remarkable for generalized oral motor weakness.  Pt consumed trials of ice chips, thin liquid, nectar-thick liquid, honey-thick liquid, puree, and regular solids.  He exhibited overt s/sx of aspiration with ice chips, thin liquid, and nectar-thick liquid c/b immediate coughing and throat clearing.  Pt exhibited a delayed throat clear with 1/3 trials of honey-thick liquid when he took multiple large sips in a row.  No clinical s/sx of aspiration observed with small sips of honey-thick liquid, puree, or regular solids.  Mastication of regular solids was noted to be mildly prolonged with trace oral residue observed.  Recommend initiation of Dysphagia 3 (soft) solids and honey-thick liquids with medication administered whole in puree.  Suspect that pt's dysphagia is mostly related to brief intubation and that it will resolve in the next few days.  SLP will f/u to determine readiness for clinical diet iniation vs need for instrumental swallow study if clinical s/sx of aspiration persist.  SLP Visit Diagnosis: Dysphagia, oropharyngeal phase (R13.12)    Aspiration Risk  Moderate aspiration risk    Diet Recommendation Dysphagia 3 (Mech soft);Honey-thick liquid   Liquid Administration via: Cup;Straw Medication Administration: Whole meds with puree Supervision: Staff to assist with self feeding;Full supervision/cueing for compensatory strategies Compensations: Minimize environmental distractions;Slow rate;Small sips/bites    Other  Recommendations Oral Care Recommendations: Oral care BID;Staff/trained caregiver to provide oral care Other  Recommendations: Order thickener from pharmacy;Remove water pitcher;Prohibited food (jello, ice cream, thin soups)   Follow up Recommendations Other (comment) (TBD)      Frequency and Duration min 2x/week  2 weeks       Prognosis Prognosis for Safe Diet Advancement: Good      Swallow Study   General HPI: 40 year old male with medical history of anxiety, anemia, GERD, seizures, substance use/abuse (heroin and methamphetamines), HTN, CKD, type 1 DM, hypothyroidism, endocarditis, heart murmur, hepatitis C, and hx of necrotizing fasciitis in multiple sites. His girlfriend brought him to the ED d/t extreme agitation. He was intubated in the ED; difficult to intubate and Anesthesia performed. UDS positive for amphetamines, benzos, and THC. He vomited during initial intubation attempts.TYREK AHUNA is a 40 y.o. male with a history of CHF, substance abuse and type 1 diabetes.  Pt was agitated upon arrival to ED and was sedated and intubated from 7/17-7/22.  UDS positive for amphetamines, benzos, and THC. Type of Study: Bedside Swallow Evaluation Previous Swallow Assessment: None Diet Prior to this Study: NPO Temperature Spikes Noted: Yes Respiratory Status: Nasal cannula History of Recent Intubation: Yes Length of Intubations (days): 5 days Date extubated: 10/07/20 Behavior/Cognition: Alert;Cooperative Oral Cavity Assessment: Dry Oral Care Completed by SLP: No Oral Cavity - Dentition: Adequate natural dentition Vision: Functional for  self-feeding Self-Feeding Abilities: Needs assist Patient Positioning: Upright in chair Baseline Vocal Quality: Breathy;Low vocal intensity Volitional Cough: Strong Volitional Swallow: Able to elicit    Oral/Motor/Sensory Function Overall Oral Motor/Sensory Function: Mild impairment Facial ROM: Within Functional Limits Facial Symmetry: Within Functional Limits Facial Strength: Within Functional Limits Facial Sensation: Within Functional Limits Lingual  ROM: Reduced right;Reduced left Lingual Symmetry: Within Functional Limits Lingual Strength: Reduced Lingual Sensation: Within Functional Limits   Ice Chips Ice chips: Impaired Presentation: Spoon Pharyngeal Phase Impairments: Throat Clearing - Immediate   Thin Liquid Thin Liquid: Impaired Presentation: Straw;Spoon Pharyngeal  Phase Impairments: Cough - Immediate    Nectar Thick Nectar Thick Liquid: Impaired Presentation: Cup;Straw Pharyngeal Phase Impairments: Throat Clearing - Immediate   Honey Thick Honey Thick Liquid: Impaired Presentation: Straw Pharyngeal Phase Impairments: Throat Clearing - Delayed   Puree Puree: Within functional limits Presentation: Spoon   Solid     Solid: Impaired Presentation: Spoon Oral Phase Functional Implications: Prolonged oral transit;Impaired mastication     Colin Mulders M.S., CCC-SLP Acute Rehabilitation Services Office: [phone]  [place] 10/08/2020,10:34 AM

## 2020-10-08 NOTE — Progress Notes (Signed)
NAME:  Luke [name], MRN:  [id number], DOB:  [birth year], LOS: 6 ADMISSION DATE:  10/02/2020, CONSULTATION DATE: 10/02/2020 REFERRING MD: Dr. Florina Ou, CHIEF COMPLAINT: Agitation and combativeness requiring intubation  History of Present Illness:  Patient is a 40 year old brought to the emergency room extremely agitated by patient's girlfriend.  He was extremely uncooperative agitated and threatening to staff.  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  Attempts to sedate the patient in the emergency room included IM Haldol, Versed, Geodon, Benadryl and ketamine prior to intubation.  Sedation was hampered due to IV access.  Intubation was difficult requiring anesthesia.  It is unknown what his drug ingestion might have been.  Urine drug screen was positive for amphetamines benzodiazepine and THC.  Patient did vomit during initial intubation attempts.   He also on this admission is noted to have a creatinine above his baseline now at 1.93 previously 1.5.  Some elevation in his baseline creatinine may be due to dehydration and elevated glucose.  So appears to be in DKA.  May have artificially elevated that value.  Post intubation blood gas looked normal.  His anion gap is 21. CPK in the emergency room was 666.  Glucose is 823.  Patient's endocarditis was felt secondary to Streptococcus mitis for which he was treated for a 3 weeks with ampicillin and gentamicin then off label with oritavancin.  He has had a previous ER admission for congestive heart failure secondary to his mitral regurgitation.  Last echocardiogram ejection fraction is 70-75%.   Pertinent  Medical History    Anemia     Anxiety  Dx 2008   Cellulitis     Chronic kidney disease     Diabetes mellitus without complication ([place]) Dx 7416   DKA (diabetic ketoacidoses) 07/21/2017   Endocarditis mitral 04/2019   Endocarditis     GERD (gastroesophageal reflux disease) Dx  2008   Headache(784.0)     Heart murmur     Hepatitis C     Hypertension     Hypothyroidism     MRSA infection     Pneumonia     Seizures ([place]) 2011    x 2 in lifetime. on Dilantin for a while.   Sepsis ([place])     Substance abuse ([place]) 2013    heroin use, multiple relapses   Type 1 diabetes ([place])       Significant Hospital Events: Including procedures, antibiotic start and stop dates in addition to other pertinent events   Intubation and admission 10/02/2020 7/19 Stable, still with significant agitation 7/20 minimal vent requirement, still gets very agitated with weaning sedatives 7/22>>Weaning 30% 5/5, but remains heavily sedated, Net + 9 L since admission, Febrile 7/22 extubated  Interim History / Subjective:  No overnight events Sore throat On fentanyl patch and as needed as needed On Precedex Still with episodes of significant agitation Objective   Blood pressure (!) 148/70, pulse 94, temperature 98.3 F (36.8 C), temperature source Oral, resp. rate (!) 27, height $RemoveBe'5\' 9"'XkAnrcLAZ$  (1.753 m), weight 82.3 kg, SpO2 93 %.    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  Polysubstance abuse 2.  Agitation -On Precedex, fentanyl, Klonopin, Seroquel -Continue to  attempt to wean as tolerated  DKA -DKA resolved, gap closed -On SSI  Acute kidney injury on chronic kidney disease stage IIIb -Trend electrolytes -Replete electrolytes -Avoid nephrotoxic's  History of hepatitis C -Outpatient follow-up  Heroin and methamphetamine drug use -Counseling when appropriate -Monitor for withdrawal  Aspiration pneumonitis -Switch to Augmentin from Unasyn -Continue oxygen supplementation  Anemia  Patient is about 8 L positive -Will give 1 dose of Lasix -Continue to monitor electrolytes closely  Best Practice (right click and "Reselect all SmartList Selections" daily)   Diet/type: Swallow eval today DVT prophylaxis: LMWH GI prophylaxis: H2B Lines: N/A Foley:  N/A Code Status:  full code Last date of multidisciplinary goals of care discussion [patient intubated and sedated]  Labs   CBC: Recent Labs  Lab 10/02/20 0240 10/03/20 0238 10/03/20 1246 10/05/20 0449 10/06/20 0245 10/07/20 0939 10/08/20 0407  WBC 12.7*   < > 13.9* 8.1 9.1 9.9 10.3  NEUTROABS 9.6*  --   --  5.4 4.9 5.1 5.8  HGB 13.4   < > 10.0* 9.3* 9.5* 9.4* 8.9*  HCT 38.9*   < > 29.7* 27.9* 28.3* 27.7* 26.1*  MCV 83.7   < > 85.6 86.6 86.8 86.6 86.1  PLT 376   < > 272 240 257 275 294   < > = values in this interval not displayed.    Basic Metabolic Panel: Recent Labs  Lab 10/03/20 0238 10/03/20 1111 10/04/20 0224 10/05/20 0449 10/06/20 0245 10/07/20 0939 10/08/20 0407  NA 143   < > 140 139 141 142 141  K 3.5   < > 3.6 3.8 3.7 3.7 3.1*  CL 111   < > 107 106 107 105 106  CO2 26   < > $R'24 25 25 27 27  'Vp$ GLUCOSE 65*   < > 263* 326* 255* 177* 75  BUN 26*   < > $R'16 16 16 15 'gJ$ 23*  CREATININE 1.34*   < > 1.17 1.18 1.13 1.18 1.13  CALCIUM 8.3*   < > 8.2* 8.0* 8.1* 8.4* 8.1*  MG 1.9  --   --   --  1.7 1.9 1.9  PHOS 4.2  --   --   --   --   --   --    < > = values in this interval not displayed.   GFR: Estimated Creatinine Clearance: 86.9 mL/min (by C-G formula based on  SCr of 1.13 mg/dL). Recent Labs  Lab 10/05/20 0449 10/06/20 0245 10/07/20 0939 10/08/20 0407  WBC 8.1 9.1 9.9 10.3    Liver Function Tests: Recent Labs  Lab 10/06/20 0245  AST 36  ALT 23  ALKPHOS 73  BILITOT 0.3  PROT 5.6*  ALBUMIN 2.5*   No results for input(s): LIPASE, AMYLASE in the last 168 hours. No results for input(s): AMMONIA in the last 168 hours.  ABG    Component Value Date/Time   PHART 7.345 (L) 10/02/2020 0850   PCO2ART 44.1 10/02/2020 0850  PO2ART 91.4 10/02/2020 0850   HCO3 23.4 10/02/2020 0850   TCO2 17 (L) 03/19/2019 0042   ACIDBASEDEF 1.8 10/02/2020 0850   O2SAT 96.4 10/02/2020 0850     Coagulation Profile: No results for input(s): INR, PROTIME in the last 168 hours.  Cardiac Enzymes: Recent Labs  Lab 10/02/20 0240 10/05/20 0449 10/08/20 0407  CKTOTAL 666* 599* 1,577*    HbA1C: Hemoglobin A1C  Date/Time Value Ref Range Status  04/11/2014 12:54 AM 11.3 (H) 4.2 - 6.3 % Final    Comment:    The American Diabetes Association recommends that a primary goal of therapy should be <7% and that physicians should reevaluate the treatment regimen in patients with HbA1c values consistently >8%.    HbA1c POC (<> result, manual entry)  Date/Time Value Ref Range Status  03/24/2018 09:45 AM >15 4.0 - 5.6 % Final   Hgb A1c MFr Bld  Date/Time Value Ref Range Status  03/04/2020 03:44 PM 11.6 (H) 4.8 - 5.6 % Final    Comment:             Prediabetes: 5.7 - 6.4          Diabetes: >6.4          Glycemic control for adults with diabetes: <7.0   08/27/2019 09:42 PM 10.8 (H) 4.8 - 5.6 % Final    Comment:    (NOTE) Pre diabetes:          5.7%-6.4%  Diabetes:              >6.4%  Glycemic control for   <7.0% adults with diabetes     CBG: Recent Labs  Lab 10/07/20 1549 10/07/20 1937 10/07/20 2329 10/08/20 0343 10/08/20 0804  GLUCAP 111* 152* 175* 87 73   Allergies No Known Allergies   Home Medications  Prior to Admission medications    Medication Sig Start Date End Date Taking? Authorizing Provider  Accu-Chek Softclix Lancets lancets USE TO CHECK BLOOD SUGAR THREE TIMES DAILY E11.65 01/14/20 01/13/21 Yes Charlott Rakes, MD  Blood Glucose Monitoring Suppl (ACCU-CHEK GUIDE ME) w/Device KIT Use to check blood sugar TID. E11.65 01/14/20  Yes Newlin, Enobong, MD  Blood Glucose Monitoring Suppl (ACCU-CHEK GUIDE) w/Device KIT USE TO CHECK BLOOD SUGAR THREE TIMES DAILY 01/14/20 01/13/21 Yes Newlin, Enobong, MD  glucose blood test strip USE TO CHECK BLOOD SUGAR THREE TIMES DAILY E11.65 01/14/20 01/13/21 Yes Newlin, Charlane Ferretti, MD  insulin glargine (LANTUS SOLOSTAR) 100 UNIT/ML Solostar Pen Inject 40 Units into the skin 2 (two) times daily. 06/23/20  Yes Newlin, Charlane Ferretti, MD  Insulin Pen Needle (TRUEPLUS 5-BEVEL PEN NEEDLES) 32G X 4 MM MISC Use as instructed to inject insulin. 11/27/19  Yes Charlott Rakes, MD  Insulin Pen Needle 31G X 5 MM MISC USE AS INSTRUCTED TO INJECT INSULIN. 11/27/19 11/26/20 Yes Charlott Rakes, MD  SUBOXONE 8-2 MG FILM SMARTSIG:1 Strip(s) By Mouth 3 Times Daily 07/14/20   [provider]    The patient is critically ill with multiple organ systems failure and requires high complexity decision making for assessment and support, frequent evaluation and titration of therapies, application of advanced monitoring technologies and extensive interpretation of multiple databases. Critical Care Time devoted to patient care services described in this note independent of APP/resident time (if applicable)  is 32 minutes.   Sherrilyn Rist MD [facility] Personal pager: See Amion If unanswered, please page CCM On-call: [phone]

## 2020-10-08 NOTE — Progress Notes (Signed)
significant issues with pain management  Will start patient back on his Suboxone which he was using at home  Decrease other opiates as tolerated

## 2020-10-08 NOTE — Progress Notes (Signed)
Pt AM K+ 3.1 with creat 1.13 and GFR > 60. CCM [facility] electrolyte protocol initiated.

## 2020-10-09 ENCOUNTER — Inpatient Hospital Stay (HOSPITAL_COMMUNITY): Payer: Medicaid Other

## 2020-10-09 DIAGNOSIS — F1910 Other psychoactive substance abuse, uncomplicated: Secondary | ICD-10-CM | POA: Diagnosis not present

## 2020-10-09 DIAGNOSIS — E0810 Diabetes mellitus due to underlying condition with ketoacidosis without coma: Secondary | ICD-10-CM | POA: Diagnosis not present

## 2020-10-09 LAB — BASIC METABOLIC PANEL
Anion gap: 12 (ref 5–15)
BUN: 28 mg/dL — ABNORMAL HIGH (ref 6–20)
CO2: 20 mmol/L — ABNORMAL LOW (ref 22–32)
Calcium: 8.1 mg/dL — ABNORMAL LOW (ref 8.9–10.3)
Chloride: 107 mmol/L (ref 98–111)
Creatinine, Ser: 1.44 mg/dL — ABNORMAL HIGH (ref 0.61–1.24)
GFR, Estimated: >60 mL/min (ref >60)
Glucose, Bld: 131 mg/dL — ABNORMAL HIGH (ref 70–99)
Potassium: 4.6 mmol/L (ref 3.5–5.1)
Sodium: 139 mmol/L (ref 135–145)

## 2020-10-09 LAB — CBC WITH DIFFERENTIAL/PLATELET
Abs Immature Granulocytes: 0.24 10*3/uL — ABNORMAL HIGH (ref 0.00–0.07)
Basophils Absolute: 0 10*3/uL (ref 0.0–0.1)
Basophils Relative: 0 %
Eosinophils Absolute: 0.3 10*3/uL (ref 0.0–0.5)
Eosinophils Relative: 3 %
HCT: 27.3 % — ABNORMAL LOW (ref 39.0–52.0)
Hemoglobin: 9.5 g/dL — ABNORMAL LOW (ref 13.0–17.0)
Immature Granulocytes: 3 %
Lymphocytes Relative: 33 %
Lymphs Abs: 3.2 10*3/uL (ref 0.7–4.0)
MCH: 28.9 pg (ref 26.0–34.0)
MCHC: 34.8 g/dL (ref 30.0–36.0)
MCV: 83 fL (ref 80.0–100.0)
Monocytes Absolute: 1.1 10*3/uL — ABNORMAL HIGH (ref 0.1–1.0)
Monocytes Relative: 12 %
Neutro Abs: 4.8 10*3/uL (ref 1.7–7.7)
Neutrophils Relative %: 49 %
Platelets: 346 10*3/uL (ref 150–400)
RBC: 3.29 MIL/uL — ABNORMAL LOW (ref 4.22–5.81)
RDW: 12.5 % (ref 11.5–15.5)
WBC: 9.7 10*3/uL (ref 4.0–10.5)
nRBC: 0 % (ref 0.0–0.2)

## 2020-10-09 LAB — GLUCOSE, CAPILLARY
Glucose-Capillary: 128 mg/dL — ABNORMAL HIGH (ref 70–99)
Glucose-Capillary: 137 mg/dL — ABNORMAL HIGH (ref 70–99)
Glucose-Capillary: 152 mg/dL — ABNORMAL HIGH (ref 70–99)
Glucose-Capillary: 173 mg/dL — ABNORMAL HIGH (ref 70–99)
Glucose-Capillary: 212 mg/dL — ABNORMAL HIGH (ref 70–99)
Glucose-Capillary: 295 mg/dL — ABNORMAL HIGH (ref 70–99)

## 2020-10-09 LAB — CK: Total CK: 974 U/L — ABNORMAL HIGH (ref 49–397)

## 2020-10-09 MED ORDER — SODIUM CHLORIDE 0.9 % IV SOLN: INTRAVENOUS | Status: DC

## 2020-10-09 MED ORDER — IPRATROPIUM-ALBUTEROL 0.5-2.5 (3) MG/3ML IN SOLN
3.0000 mL | Freq: Two times a day (BID) | RESPIRATORY_TRACT | Status: DC
  Administered 2020-10-09 – 2020-10-10 (×2): 3 mL via RESPIRATORY_TRACT
  Filled 2020-10-09 (×2): qty 3

## 2020-10-09 MED ORDER — FENTANYL CITRATE (PF) 100 MCG/2ML IJ SOLN
12.5000 ug | INTRAMUSCULAR | Status: DC | PRN
Start: 2020-10-09 — End: 2020-10-10
  Administered 2020-10-09 (×2): 25 ug via INTRAVENOUS
  Filled 2020-10-09 (×2): qty 2

## 2020-10-09 MED ORDER — IPRATROPIUM-ALBUTEROL 0.5-2.5 (3) MG/3ML IN SOLN: 3.0000 mL | Freq: Four times a day (QID) | RESPIRATORY_TRACT | Status: DC

## 2020-10-09 MED ORDER — FUROSEMIDE 40 MG PO TABS: 40.0000 mg | ORAL_TABLET | Freq: Once | ORAL | Status: DC

## 2020-10-09 MED ORDER — INSULIN DETEMIR 100 UNIT/ML ~~LOC~~ SOLN
20.0000 [IU] | Freq: Two times a day (BID) | SUBCUTANEOUS | Status: DC
  Administered 2020-10-09: 20 [IU] via SUBCUTANEOUS
  Filled 2020-10-09 (×2): qty 0.2

## 2020-10-09 NOTE — Progress Notes (Signed)
Pt fell out of bed at 0210. Pt was found on the floor and IJ with no longer intact. 3 RN's helped to safely assist pt back to bed. Dr. Gillermina Phy with [facility] was notified and Head CT was performed.   10/09/20 0215  What Happened  Was fall witnessed? No  Was patient injured? No  Patient found on floor  Found by Staff-comment ([facility] Microbiologist)  Stated prior activity other (comment) (readjusting in bed)  Follow Up  MD notified Dr. Gillermina Phy with [facility]  Time MD notified [phone]  Additional tests Yes-comment (Head CT)  Progress note created (see row info) Yes  Adult Fall Risk Assessment  Risk Factor Category (scoring not indicated) Not Applicable  Age 40  Fall History: Fall within 6 months prior to admission 5  Elimination; Bowel and/or Urine Incontinence 0  Elimination; Bowel and/or Urine Urgency/Frequency 2  Medications: includes PCA/Opiates, Anti-convulsants, Anti-hypertensives, Diuretics, Hypnotics, Laxatives, Sedatives, and Psychotropics 5  Patient Care Equipment 3  Mobility-Assistance 2  Mobility-Gait 2  Mobility-Sensory Deficit 0  Altered awareness of immediate physical environment 1  Impulsiveness 2  Lack of understanding of one's physical/cognitive limitations 4  Total Score 26  Patient Fall Risk Level High fall risk  Adult Fall Risk Interventions  Required Bundle Interventions *See Row Information* High fall risk - low, moderate, and high requirements implemented  Additional Interventions Use of appropriate toileting equipment (bedpan, BSC, etc.)  Screening for Fall Injury Risk (To be completed on HIGH fall risk patients) - Assessing Need for Floor Mats  Risk For Fall Injury- Criteria for Floor Mats Noncompliant with safety precautions  Will Implement Floor Mats Yes  Vitals  Pulse Rate 88  ECG Heart Rate 95  Resp (!) 21  Oxygen Therapy  SpO2 92 %
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
## 2020-10-09 NOTE — Progress Notes (Signed)
NAME:  CHARISTOPHER SHAUT, MRN:  [id number], DOB:  Mar 29, 1980, LOS: 7 ADMISSION DATE:  10/02/2020, CONSULTATION DATE: 10/02/2020 REFERRING MD: Dr. Florina Ou, CHIEF COMPLAINT: Agitation and combativeness requiring intubation  History of Present Illness:  Patient is a 40 year old brought to the emergency room extremely agitated by patient's girlfriend.  He was extremely uncooperative agitated and threatening to staff.  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  Attempts to sedate the patient in the emergency room included IM Haldol, Versed, Geodon, Benadryl and ketamine prior to intubation.  Sedation was hampered due to IV access.  Intubation was difficult requiring anesthesia.  It is unknown what his drug ingestion might have been.  Urine drug screen was positive for amphetamines benzodiazepine and THC.  Patient did vomit during initial intubation attempts.   He also on this admission is noted to have a creatinine above his baseline now at 1.93 previously 1.5.  Some elevation in his baseline creatinine may be due to dehydration and elevated glucose.  So appears to be in DKA.  May have artificially elevated that value.  Post intubation blood gas looked normal.  His anion gap is 21. CPK in the emergency room was 666.  Glucose is 823.  Patient's endocarditis was felt secondary to Streptococcus mitis for which he was treated for a 3 weeks with ampicillin and gentamicin then off label with oritavancin.  He has had a previous ER admission for congestive heart failure secondary to his mitral regurgitation.  Last echocardiogram ejection fraction is 70-75%.   Pertinent  Medical History    Anemia     Anxiety  Dx 2008   Cellulitis     Chronic kidney disease     Diabetes mellitus without complication ([place]) Dx [id number]   DKA (diabetic ketoacidoses) 07/21/2017   Endocarditis mitral 04/2019   Endocarditis     GERD (gastroesophageal reflux disease) Dx  2008   Headache(784.0)     Heart murmur     Hepatitis C     Hypertension     Hypothyroidism     MRSA infection     Pneumonia     Seizures ([place]) 2011    x 2 in lifetime. on Dilantin for a while.   Sepsis ([place])     Substance abuse ([place]) 2013    heroin use, multiple relapses   Type 1 diabetes ([place])       Significant Hospital Events: Including procedures, antibiotic start and stop dates in addition to other pertinent events   Intubation and admission 10/02/2020 7/19 Stable, still with significant agitation 7/20 minimal vent requirement, still gets very agitated with weaning sedatives 7/22>>Weaning 30% 5/5, but remains heavily sedated, Net + 9 L since admission, Febrile 7/22 extubated  Interim History / Subjective:  Unwitnessed fall during the night, head CT negative.  Denies any symptoms at present Awake and alert and interactive Objective   Blood pressure (!) 149/58, pulse (!) 105, temperature 98.4 F (36.9 C), temperature source Tympanic, resp. rate (!) 22, height '5\' 9"'$  (1.753 m), weight 79.6 kg, SpO2 94 %.        Intake/Output Summary (Last 24 hours) at 10/09/2020 1045 Last data filed at 10/09/2020 0500 Gross per 24 hour  Intake 518.15 ml  Output 1351 ml  Net -832.85 ml   Filed Weights   10/07/20 0500 10/08/20 0417 10/09/20 0404  Weight: 84 kg 82.3 kg 79.6 kg    Examination: General: Comfortable HENT: Moist  oral mucosa lungs: Fair air entry Cardiovascular: S1-S2 appreciated Abdomen: Bowel sounds appreciated Extremities: Bilateral upper extremity scarring, left great toe amputation  neuro: Awake and interactive GU: Fair output  [id number]  Resolved Hospital Problem list   NA  Assessment & Plan:  1.  Polysubstance abuse 2.  Agitation 3 opiate dependence -Home Suboxone restarted -Was placed on morphine15 mg p.o. twice daily-this is a new medication may be weaned off -Duragesic patch-new medication may be weaned off   DKA at presentation -DKA  resolved Gap closed On SSI  Acute kidney injury on chronic kidney disease stage IIIb -Continue to maintain renal perfusion -Replete electrolytes as needed  History of hepatitis C -Outpatient follow-up  Heroin and methamphetamine drug use -Counseling when appropriate  Aspiration pneumonitis -To complete Augmentin  Anemia  Fluid status improved but still about 5 L positive during this admission -Slight bump in BUN/creatinine-monitor  Sherrilyn Rist, MD [place] PCCM Pager: See Shea Evans

## 2020-10-09 NOTE — Progress Notes (Signed)
eLink Physician-Brief Progress Note Patient Name: MANSEL KIRGAN DOB: 06-18-1980 MRN: [id number]   Date of Service  10/09/2020  HPI/Events of Note  Unwitnesssed fall with ? Head strike.  eICU Interventions  NCHCT ordered.     Intervention Category Major Interventions: Other:  Charlott Rakes 10/09/2020, 2:46 AM

## 2020-10-09 NOTE — Progress Notes (Signed)
  Speech Language Pathology Treatment: Dysphagia  [name] MRN: [id number] DOB: [birth year] Today's Date: 10/09/2020 Time: 7619-5093 SLP Time Calculation (min) (ACUTE ONLY): 8 min  Assessment / Plan / Recommendation Clinical Impression  Pt was seen for skilled ST targeting diet tolerance and advancement.  Pt was encountered awake/alert and he was noted to have improved vocal intensity from yesterday.  Pt was seen with regular solids and thin liquids.  Pt exhibited mildly prolonged, but effective mastication of regular solids with no oral residue observed.  He additionally consumed >3oz of thin liquid without overt s/sx of aspiration.  Recommend diet change to regular solids and thin liquids with medication administered whole with liquid. Pt was educated regarding diet upgrade and compensatory strategies and he verbalized understanding.  No further skilled ST is warranted at this time.  Please re-consult if additional needs arise.    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 His girlfriend brought him to the ED d/t extreme agitation. He was intubated in the ED; difficult to intubate and Anesthesia performed. UDS positive for amphetamines, benzos, and THC. He vomited during initial intubation attempts. Patient was intubated from 7/17-7/22.      SLP Plan  All goals met       Recommendations  Diet recommendations: Regular;Thin liquid Liquids provided via: Cup;Straw Medication Administration: Whole meds with liquid Supervision: Patient able to self feed;Intermittent supervision to cue for compensatory strategies Compensations: Minimize environmental distractions;Slow rate;Small sips/bites Postural Changes and/or Swallow Maneuvers: Seated upright 90  degrees                Oral Care Recommendations: Oral care BID Follow up Recommendations: None SLP Visit Diagnosis: Dysphagia, unspecified (R13.10) Plan: All goals met       GO               Colin Mulders., M.S., [place] [facility] Office: [phone]  [place] 10/09/2020, 2:57 PM

## 2020-10-10 DIAGNOSIS — N189 Chronic kidney disease, unspecified: Secondary | ICD-10-CM

## 2020-10-10 DIAGNOSIS — R451 Restlessness and agitation: Secondary | ICD-10-CM

## 2020-10-10 LAB — BASIC METABOLIC PANEL
Anion gap: 12 (ref 5–15)
BUN: 20 mg/dL (ref 6–20)
CO2: 22 mmol/L (ref 22–32)
Calcium: 8.3 mg/dL — ABNORMAL LOW (ref 8.9–10.3)
Chloride: 106 mmol/L (ref 98–111)
Creatinine, Ser: 1.05 mg/dL (ref 0.61–1.24)
GFR, Estimated: >60 mL/min (ref >60)
Glucose, Bld: 203 mg/dL — ABNORMAL HIGH (ref 70–99)
Potassium: 3 mmol/L — ABNORMAL LOW (ref 3.5–5.1)
Sodium: 140 mmol/L (ref 135–145)

## 2020-10-10 LAB — GLUCOSE, CAPILLARY
Glucose-Capillary: 234 mg/dL — ABNORMAL HIGH (ref 70–99)
Glucose-Capillary: 196 mg/dL — ABNORMAL HIGH (ref 70–99)
Glucose-Capillary: 156 mg/dL — ABNORMAL HIGH (ref 70–99)

## 2020-10-10 MED ORDER — AMOXICILLIN-POT CLAVULANATE 875-125 MG PO TABS: 1.0000 | ORAL_TABLET | Freq: Two times a day (BID) | ORAL | 0 refills | Status: AC

## 2020-10-10 MED ORDER — LANTUS SOLOSTAR 100 UNIT/ML ~~LOC~~ SOPN: 40.0000 [IU] | PEN_INJECTOR | Freq: Two times a day (BID) | SUBCUTANEOUS | 2 refills | Status: DC

## 2020-10-10 MED ORDER — IPRATROPIUM-ALBUTEROL 0.5-2.5 (3) MG/3ML IN SOLN: 3.0000 mL | Freq: Four times a day (QID) | RESPIRATORY_TRACT | Status: DC | PRN

## 2020-10-10 MED ORDER — POTASSIUM CHLORIDE 20 MEQ PO PACK
40.0000 meq | PACK | Freq: Once | ORAL | Status: AC
  Administered 2020-10-10: 40 meq via ORAL
  Filled 2020-10-10: qty 2

## 2020-10-10 MED ORDER — INSULIN ASPART 100 UNIT/ML FLEXPEN: 2.0000 [IU] | PEN_INJECTOR | Freq: Three times a day (TID) | SUBCUTANEOUS | 2 refills | Status: DC

## 2020-10-10 MED ORDER — POTASSIUM CHLORIDE 20 MEQ PO PACK
40.0000 meq | PACK | Freq: Once | ORAL | Status: DC
  Filled 2020-10-10: qty 2

## 2020-10-10 MED ORDER — OXYCODONE HCL 5 MG PO TABS
5.0000 mg | ORAL_TABLET | Freq: Four times a day (QID) | ORAL | Status: DC | PRN
  Administered 2020-10-10: 5 mg via ORAL
  Filled 2020-10-10: qty 1

## 2020-10-10 NOTE — Discharge Summary (Signed)
Physician Discharge Summary  CALIX HEINBAUGH MPN:[id number] DOB: 02-11-1981 DOA: 10/02/2020  PCP: Gildardo Pounds, NP  Admit date: 10/02/2020 Discharge date: 10/10/2020  Admitted From: Home Disposition: Home   Recommendations for Outpatient Follow-up:  Follow up with PCP in 1-2 weeks Continue Augmentin to complete antibiotic course for aspiration pneumonia Refilled home Lantus and NovoLog Continue to encourage illicit drug cessation  Home Health: No Equipment/Devices: None  Discharge Condition: Stable CODE STATUS: Full code Diet recommendation: Consistent carbohydrate diet  History of present illness:  DEREKE NEUMANN is a 40 year old male with past medical history significant for polysubstance abuse, type 2 diabetes mellitus, CKD stage IIIb, history of endocarditis, chronic opioid dependence on Suboxone who presented to [facility] ED by his girlfriend on 7/17 for agitation.  Was extremely uncooperative with severe agitation and threatening to staff.  Patient received IM Haldol, Versed, Geodon, ketamine and Benadryl in the ED prior to intubation.  Patient was noted to be in DKA with a glucose of 823 with acute on chronic renal failure.  Patient was admitted to the PCCM service on insulin and Precedex drip.  Patient was subsequently extubated on 7/22.  Transferred to hospitalist service on 10/09/2020.   Hospital course:  Diabetic ketoacidosis, POA: Resolved Hx T2DM Patient presenting to the ED with agitation, noted to have elevated blood sugar of 823 with anion gap of 21.  Patient with hemoglobin A1c 11.6 on 03/04/2020.  Etiology likely secondary to medication noncompliance.  Patient was initially started on insulin drip which was titrated off.  Patient will resume his home Lantus 40 units subcutaneously twice daily with NovoLog sliding scale.  Patient instructed to make appointment with his PCP for further guidance and monitoring.  Acute renal failure on CKD stage IIIb, POA Etiology likely secondary  to volume depletion from DKA as above.  Receive aggressive IV fluid hydration during hospitalization with resolution in creatinine back to his normal baseline.  Creatinine 1.05 at time of discharge.  Aspiration pneumonia/pneumonitis During intubation, patient with aspiration event.  Patient was started on IV antibiotics and will continue Augmentin on discharge to complete 7-day course.  Acute metabolic encephalopathy, POA Agitation Withdrawals from underlying substance abuse Required intubation and placement on Precedex in the intensive care unit.  Withdrawal symptoms have now resolved and appropriate.  Discussed need for complete cessation from illicit drugs.  Opioid dependence Continue Suboxone, outpatient follow-up with PCP for further refills.  Substance abuse UDS on admission positive for amphetamines, benzodiazepines, THC.  Also history of heroin abuse.  Counseled on need for complete cessation.  History of endocarditis Previous history of endocarditis felt secondary to Streptococcus mitis in which she was treated for 3 weeks with ampicillin and gentamicin then off label with oritavancin.  Hx Hepatitis C: Outpatient follow-up with ID/GI  Discharge Diagnoses:  Active Problems:   Polysubstance abuse ([place])   DKA (diabetic ketoacidosis) ([place])    Discharge Instructions  Discharge Instructions     Call MD for:  difficulty breathing, headache or visual disturbances   Complete by: As directed    Call MD for:  extreme fatigue   Complete by: As directed    Call MD for:  persistant dizziness or light-headedness   Complete by: As directed    Call MD for:  persistant nausea and vomiting   Complete by: As directed    Call MD for:  severe uncontrolled pain   Complete by: As directed    Call MD for:  temperature >100.4   Complete by: As  directed    Diet - low sodium heart healthy   Complete by: As directed    Increase activity slowly   Complete by: As directed       Allergies  as of 10/10/2020   No Known Allergies      Medication List     STOP taking these medications    albuterol 108 (90 Base) MCG/ACT inhaler Commonly known as: VENTOLIN HFA   sertraline 50 MG tablet Commonly known as: ZOLOFT       TAKE these medications    Accu-Chek Guide Me w/Device Kit Use to check blood sugar TID. E11.65   Accu-Chek Guide w/Device Kit USE TO CHECK BLOOD SUGAR THREE TIMES DAILY   Accu-Chek Guide test strip Generic drug: glucose blood USE TO CHECK BLOOD SUGAR THREE TIMES DAILY E11.65   Accu-Chek Softclix Lancets lancets USE TO CHECK BLOOD SUGAR THREE TIMES DAILY E11.65   amoxicillin-clavulanate 875-125 MG tablet Commonly known as: AUGMENTIN Take 1 tablet by mouth every 12 (twelve) hours for 2 days.   insulin aspart 100 UNIT/ML FlexPen Commonly known as: NOVOLOG Inject 2-10 Units into the skin 3 (three) times daily with meals. Glucose 151-200 take 2 units, glucose 201-250 take 4 units, glucose 251-300 take 6 units, glucose 301-350 take 8 units, glucose 351-400 take 10 units   Lantus SoloStar 100 UNIT/ML Solostar Pen Generic drug: insulin glargine Inject 40 Units into the skin 2 (two) times daily.   Suboxone 8-2 MG Film Generic drug: Buprenorphine HCl-Naloxone HCl SMARTSIG:1 Strip(s) By Mouth 3 Times Daily   TRUEplus 5-Bevel Pen Needles 32G X 4 MM Misc Generic drug: Insulin Pen Needle Use as instructed to inject insulin.   TRUEplus Pen Needles 31G X 5 MM Misc Generic drug: Insulin Pen Needle USE AS INSTRUCTED TO INJECT INSULIN.        Follow-up Information     Gildardo Pounds, NP. Schedule an appointment as soon as possible for a visit in 1 week(s).   Specialty: Nurse Practitioner Contact information: [place] [address] [phone]                No Known Allergies  Consultations: PCCM   Procedures/Studies: DG Chest 1 View  Result Date: 10/02/2020 CLINICAL DATA:  40 year old male with history of  central line placement. EXAM: CHEST  1 VIEW COMPARISON:  Chest x-ray 10/02/2020. FINDINGS: An endotracheal tube is in place with tip 5.8 cm above the carina. A nasogastric tube is seen extending into the stomach, however, the tip of the nasogastric tube extends below the lower margin of the image. Right internal jugular central venous catheter with tip terminating in the mid superior vena cava. Lung volumes are low. Patchy multifocal interstitial prominence and airspace disease most evident in the right lung base and in the mid lungs bilaterally. No pleural effusions. No pneumothorax. No evidence of pulmonary edema. Heart size is borderline enlarged. The patient is rotated to the right on today's exam, resulting in distortion of the mediastinal contours and reduced diagnostic sensitivity and specificity for mediastinal pathology. IMPRESSION: 1. Support apparatus, as above. 2. Progressive multilobar bilateral pneumonia, as above. Electronically Signed   By: Vinnie Langton M.D.   On: 10/02/2020 11:00   DG Abd 1 View  Result Date: 10/06/2020 CLINICAL DATA:  OG tube placement. EXAM: ABDOMEN - 1 VIEW COMPARISON:  10/02/2020 FINDINGS: The tip of an NG/OG tube is identified in the mid stomach. Proximal side port is well below the GE junction. Stomach is  distended with gas. Nonspecific gas pattern within the visualized abdomen. Patchy/streaky opacity at the lung bases likely atelectasis. IMPRESSION: OG tube tip is in the mid stomach. Gaseous distention of the stomach. Electronically Signed   By: Misty Stanley M.D.   On: 10/06/2020 09:57   CT HEAD WO CONTRAST  Result Date: 10/09/2020 CLINICAL DATA:  Head trauma, mod-severe EXAM: CT HEAD WITHOUT CONTRAST TECHNIQUE: Contiguous axial images were obtained from the base of the skull through the vertex without intravenous contrast. COMPARISON:  06/13/2013 FINDINGS: Brain: There is no mass, hemorrhage or extra-axial collection. There is generalized atrophy without lobar  predilection. Hypodensity of the white matter is most commonly associated with chronic microvascular disease. There is an old left cerebellar infarct. Vascular: No abnormal hyperdensity of the major intracranial arteries or dural venous sinuses. No intracranial atherosclerosis. Skull: The visualized skull base, calvarium and extracranial soft tissues are normal. Sinuses/Orbits: Moderate paranasal sinus disease, greatest in the sphenoid sinus. The orbits are normal. IMPRESSION: 1. No acute intracranial abnormality. 2. 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 3. Moderate paranasal sinus disease, greatest in the sphenoid sinus. Electronically Signed   By: Ulyses Jarred M.D.   On: 10/09/2020 03:47   DG CHEST PORT 1 VIEW  Result Date: 10/08/2020 CLINICAL DATA:  Follow-up aspiration EXAM: PORTABLE CHEST 1 VIEW COMPARISON:  Chest radiograph from one day prior. FINDINGS: Interval extubation. Left internal jugular central venous catheter terminates at the cavoatrial junction. Stable cardiomediastinal silhouette with normal heart size. No pneumothorax. No pleural effusion. Improved lung volumes. Patchy parahilar and lower lung opacities bilaterally, similar. IMPRESSION: Improved lung volumes. Persistent patchy parahilar and lower lung opacities bilaterally, compatible with aspiration, pneumonia or atelectasis. Electronically Signed   By: Ilona Sorrel M.D.   On: 10/08/2020 08:11   DG CHEST PORT 1 VIEW  Result Date: 10/07/2020 CLINICAL DATA:  Aspiration. EXAM: PORTABLE CHEST 1 VIEW COMPARISON:  October 06, 2020. FINDINGS: Stable cardiomediastinal silhouette. Endotracheal tube appears to been retracted several cm. Nasogastric tube is unchanged in position. Left internal jugular catheter is unchanged. No pneumothorax is noted. Stable bilateral lung opacities are noted concerning for pneumonia or edema. Bony thorax is unremarkable. IMPRESSION: Stable bilateral lung opacities as described  above. Endotracheal tube appears to been retracted several cm. Electronically Signed   By: Marijo Conception M.D.   On: 10/07/2020 11:56   DG Chest Port 1 View  Result Date: 10/06/2020 CLINICAL DATA:  Vomiting. EXAM: PORTABLE CHEST 1 VIEW COMPARISON:  10/05/2020 FINDINGS: 0626 hours. Endotracheal tube tip is 5.6 cm above the base of the carina. Left IJ central line tip overlies the distal SVC level. The NG tube passes into the stomach although the distal tip position is not included on the film. Prominent gastric bubble noted despite the presence of the NG tube. Lung volumes are low bilaterally with persistent bibasilar collapse/consolidation, similar to prior. Telemetry leads overlie the chest. IMPRESSION: Stable exam. Low lung volumes with persistent bibasilar collapse/consolidation. Prominent gastric bubble despite the presence of an NG tube. Electronically Signed   By: Misty Stanley M.D.   On: 10/06/2020 06:40   DG CHEST PORT 1 VIEW  Result Date: 10/05/2020 CLINICAL DATA:  Central line placement EXAM: PORTABLE CHEST 1 VIEW COMPARISON:  10/05/2020, 10/03/2020, 10/02/2020 FINDINGS: Endotracheal tube tip about 5.5 cm superior to carina. Esophageal tube tip below the diaphragm but incompletely included. New left IJ central venous catheter with tip over the cavoatrial region. No left pneumothorax. Cardiomegaly with vascular congestion.  Similar airspace disease in the right greater than left chest. IMPRESSION: 1. Left-sided central venous catheter tip over the cavoatrial region. No pneumothorax 2. Mild cardiomegaly with vascular congestion. Probable small right effusion and similar right greater than left airspace disease Electronically Signed   By: Donavan Foil M.D.   On: 10/05/2020 18:19   DG Chest Port 1 View  Result Date: 10/05/2020 CLINICAL DATA:  Respiratory failure. EXAM: PORTABLE CHEST 1 VIEW COMPARISON:  10/03/2020. FINDINGS: Endotracheal tube, NG tube, right IJ line in stable position. Borderline  cardiomegaly. Bilateral pulmonary infiltrates/edema again noted. Similar findings noted on prior exam. Small right pleural effusion cannot be excluded. No pneumothorax. Mild gastric distention noted. IMPRESSION: 1.  Lines and tubes in stable position. 2. Borderline cardiomegaly. Bilateral pulmonary infiltrates/edema again noted. Similar findings noted on prior exam. Small right pleural effusion cannot be excluded. 3.  Mild gastric distention. Electronically Signed   By: Marcello Moores  Register   On: 10/05/2020 05:47   DG Chest Port 1 View  Result Date: 10/03/2020 CLINICAL DATA:  Pneumonia. EXAM: PORTABLE CHEST 1 VIEW COMPARISON:  10/02/2020 FINDINGS: 0524 hours. Endotracheal tube tip is 5.8 cm above the base of the carina. Interval progression of patchy airspace disease in the right mid and lower lung with similar airspace opacity at the left base. Cardiopericardial silhouette is at upper limits of normal for size. The NG tube passes into the stomach although the distal tip position is not included on the film. Right IJ central line tip overlies the mid to distal SVC. Telemetry leads overlie the chest. IMPRESSION: Interval progression of patchy airspace disease in the right mid and lower lung with asymmetric airspace opacity at the left base. Imaging features consistent with multifocal pneumonia. Electronically Signed   By: Misty Stanley M.D.   On: 10/03/2020 05:55   DG Chest Port 1 View  Result Date: 10/02/2020 CLINICAL DATA:  40 year old male with history of difficult intubation. EXAM: PORTABLE CHEST 1 VIEW COMPARISON:  Chest x-ray 09/15/2019. FINDINGS: An endotracheal tube is in place with tip 5.7 cm above the carina. A nasogastric tube is seen extending into the stomach, however, the tip of the nasogastric tube extends below the lower margin of the image. Compared to the prior examination there has been improvement in aeration throughout the lungs bilaterally with significant regression of multifocal airspace  disease. Some residual airspace consolidation is noted in the perihilar aspect of the right lung, somewhat mass-like in appearance. Left lung appears relatively clear. No pleural effusions. No pneumothorax. No evidence of pulmonary edema. Heart size is normal. IMPRESSION: 1. Support apparatus, as above. 2. Improving aeration in the lungs bilaterally with some residual airspace consolidation in the perihilar aspect of the right lung. Electronically Signed   By: Vinnie Langton M.D.   On: 10/02/2020 06:41   DG Abd Portable 1V  Result Date: 10/02/2020 CLINICAL DATA:  40 year old male status post orogastric tube placement. EXAM: PORTABLE ABDOMEN - 1 VIEW COMPARISON:  No priors. FINDINGS: Enteric tube in position with tip and side port projecting over the stomach. Visualized bowel gas pattern is unremarkable. IMPRESSION: 1. Tip and side port of enteric tube project over the stomach. Electronically Signed   By: Vinnie Langton M.D.   On: 10/02/2020 06:43     Subjective: Patient seen examined bedside, resting comfortably.  States ride to go home is coming at 8 AM.  Ready for discharge.  No other questions or concerns at this time.  Requesting refill of his insulin.  Discussed need  for complete cessation regarding illicit drugs and compliance with his home medications.  No other questions or concerns at this time.  Denies headache, no fever/chills/night sweats, no nausea/vomiting/diarrhea, no chest pain, no palpitations, no shortness of breath, no abdominal pain, no weakness, no fatigue, no paresthesias.  No acute events overnight per nursing staff.  Discharge Exam: Vitals:   10/10/20 0430 10/10/20 0715  BP: (!) 151/81   Pulse: 96   Resp: 18   Temp: 98.4 F (36.9 C)   SpO2: 97% 99%   Vitals:   10/09/20 2113 10/10/20 0430 10/10/20 0500 10/10/20 0715  BP: (!) 150/83 (!) 151/81    Pulse: (!) 102 96    Resp: 20 18    Temp: 99.1 F (37.3 C) 98.4 F (36.9 C)    TempSrc: Oral Oral    SpO2: 100% 97%   99%  Weight:   79.6 kg   Height:        General: Pt is alert, awake, not in acute distress, chronically ill in appearance Cardiovascular: RRR, S1/S2 +, + 3/6 SEM, no rubs, no gallops Respiratory: CTA bilaterally, no wheezing, no rhonchi, on room air Abdominal: Soft, NT, ND, bowel sounds + Extremities: no edema, no cyanosis    The results of significant diagnostics from this hospitalization (including imaging, microbiology, ancillary and laboratory) are listed below for reference.     Microbiology: Recent Results (from the past 240 hour(s))  Resp Panel by RT-PCR (Flu A&B, Covid) Nasopharyngeal Swab     Status: None   Collection Time: 10/02/20  3:46 AM   Specimen: Nasopharyngeal Swab; Nasopharyngeal(NP) swabs in vial transport medium  Result Value Ref Range Status   SARS Coronavirus 2 by RT PCR NEGATIVE NEGATIVE Final    Comment: (NOTE) SARS-CoV-2 target nucleic acids are NOT DETECTED.  The SARS-CoV-2 RNA is generally detectable in upper respiratory specimens during the acute phase of infection. The lowest concentration of SARS-CoV-2 viral copies this assay can detect is 138 copies/mL. A negative result does not preclude SARS-Cov-2 infection and should not be used as the sole basis for treatment or other patient management decisions. A negative result may occur with  improper specimen collection/handling, submission of specimen other than nasopharyngeal swab, presence of viral mutation(s) within the areas targeted by this assay, and inadequate number of viral copies(<138 copies/mL). A negative result must be combined with clinical observations, patient history, and epidemiological information. The expected result is Negative.  Fact Sheet for Patients:  EntrepreneurPulse.com.au  Fact Sheet for Healthcare Providers:  IncredibleEmployment.be  This test is no t yet approved or cleared by the [place] FDA and  has been authorized for  detection and/or diagnosis of SARS-CoV-2 by FDA under an Emergency Use Authorization (EUA). This EUA will remain  in effect (meaning this test can be used) for the duration of the COVID-19 declaration under Section 564(b)(1) of the Act, 21 U.S.C.section 360bbb-3(b)(1), unless the authorization is terminated  or revoked sooner.       Influenza A by PCR NEGATIVE NEGATIVE Final   Influenza B by PCR NEGATIVE NEGATIVE Final    Comment: (NOTE) The Xpert Xpress SARS-CoV-2/FLU/RSV plus assay is intended as an aid in the diagnosis of influenza from Nasopharyngeal swab specimens and should not be used as a sole basis for treatment. Nasal washings and aspirates are unacceptable for Xpert Xpress SARS-CoV-2/FLU/RSV testing.  Fact Sheet for Patients: EntrepreneurPulse.com.au  Fact Sheet for Healthcare Providers: IncredibleEmployment.be  This test is not yet approved or cleared by the [place]  FDA and has been authorized for detection and/or diagnosis of SARS-CoV-2 by FDA under an Emergency Use Authorization (EUA). This EUA will remain in effect (meaning this test can be used) for the duration of the COVID-19 declaration under Section 564(b)(1) of the Act, 21 U.S.C. section 360bbb-3(b)(1), unless the authorization is terminated or revoked.  Performed at [facility], [address], [place], [place] [address]   MRSA Next Gen by PCR, Nasal     Status: None   Collection Time: 10/02/20  9:08 AM   Specimen: Nasal Mucosa; Nasal Swab  Result Value Ref Range Status   MRSA by PCR Next Gen NOT DETECTED NOT DETECTED Final    Comment: (NOTE) The GeneXpert MRSA Assay (FDA approved for NASAL specimens only), is one component of a comprehensive MRSA colonization surveillance program. It is not intended to diagnose MRSA infection nor to guide or monitor treatment for MRSA infections. Test performance is not FDA approved in patients less than 38  years old. Performed at [facility], [address], [place], [place] [address]   Culture, Respiratory w Gram Stain     Status: None   Collection Time: 10/02/20 11:21 AM   Specimen: Tracheal Aspirate; Respiratory  Result Value Ref Range Status   Specimen Description   Final    TRACHEAL ASPIRATE Performed at [facility], [address], [place], [place] [address]    Special Requests   Final    NONE Performed at [facility], [address] [place], [place] [address]    Gram Stain   Final    FEW WBC PRESENT, PREDOMINANTLY PMN FEW GRAM POSITIVE COCCI IN CHAINS IN CLUSTERS FEW GRAM NEGATIVE RODS RARE YEAST RARE GRAM POSITIVE RODS    Culture   Final    ABUNDANT Normal respiratory flora-no Staph aureus or Pseudomonas seen Performed at [facility], [address], [place], [place] [address]    Report Status 10/05/2020 FINAL  Final     Labs: BNP (last 3 results) No results for input(s): BNP in the last 8760 hours. Basic Metabolic Panel: Recent Labs  Lab 10/06/20 0245 10/07/20 0939 10/08/20 0407 10/09/20 0717 10/10/20 0414  NA 141 142 141 139 140  K 3.7 3.7 3.1* 4.6 3.0*  CL 107 105 106 107 106  CO2 25 27 27  20* 22  GLUCOSE 255* 177* 75 131* 203*  BUN 16 15 23* 28* 20  CREATININE 1.13 1.18 1.13 1.44* 1.05  CALCIUM 8.1* 8.4* 8.1* 8.1* 8.3*  MG 1.7 1.9 1.9  --   --    Liver Function Tests: Recent Labs  Lab 10/06/20 0245  AST 36  ALT 23  ALKPHOS 73  BILITOT 0.3  PROT 5.6*  ALBUMIN 2.5*   No results for input(s): LIPASE, AMYLASE in the last 168 hours. No results for input(s): AMMONIA in the last 168 hours. CBC: Recent Labs  Lab 10/05/20 0449 10/06/20 0245 10/07/20 0939 10/08/20 0407 10/09/20 0717  WBC 8.1 9.1 9.9 10.3 9.7  NEUTROABS 5.4 4.9 5.1 5.8 4.8  HGB 9.3* 9.5* 9.4* 8.9* 9.5*  HCT 27.9* 28.3* 27.7* 26.1* 27.3*  MCV 86.6 86.8 86.6 86.1 83.0  PLT 240 257 275 294 346   Cardiac  Enzymes: Recent Labs  Lab 10/05/20 0449 10/08/20 0407 10/09/20 0717  CKTOTAL 599* 1,577* 974*   BNP: Invalid input(s): POCBNP CBG: Recent Labs  Lab 10/09/20 0926 10/09/20 1220 10/09/20 1528 10/09/20 2204 10/10/20 0427  GLUCAP 212* 295* 173*  234* 196*   D-Dimer No results for input(s): DDIMER in the last 72 hours. Hgb A1c No results for input(s): HGBA1C in the last 72 hours. Lipid Profile No results for input(s): CHOL, HDL, LDLCALC, TRIG, CHOLHDL, LDLDIRECT in the last 72 hours. Thyroid function studies No results for input(s): TSH, T4TOTAL, T3FREE, THYROIDAB in the last 72 hours.  Invalid input(s): FREET3 Anemia work up Recent Labs    10/08/20 0407  FERRITIN 195  TIBC 186*  IRON 21*   Urinalysis    Component Value Date/Time   COLORURINE STRAW (A) 10/02/2020 0405   APPEARANCEUR CLEAR 10/02/2020 0405   APPEARANCEUR Clear 04/11/2014 0138   LABSPEC 1.024 10/02/2020 0405   LABSPEC 1.026 04/11/2014 0138   PHURINE 6.0 10/02/2020 0405   GLUCOSEU >=500 (A) 10/02/2020 0405   GLUCOSEU >=500 04/11/2014 0138   HGBUR MODERATE (A) 10/02/2020 0405   BILIRUBINUR NEGATIVE 10/02/2020 0405   BILIRUBINUR negative 03/24/2018 1032   BILIRUBINUR neg 02/18/2017 0946   BILIRUBINUR Negative 04/11/2014 0138   KETONESUR 5 (A) 10/02/2020 0405   PROTEINUR 30 (A) 10/02/2020 0405   UROBILINOGEN 0.2 03/24/2018 1032   UROBILINOGEN 0.2 12/10/2013 1221   NITRITE NEGATIVE 10/02/2020 0405   LEUKOCYTESUR NEGATIVE 10/02/2020 0405   LEUKOCYTESUR Negative 04/11/2014 0138   Sepsis Labs Invalid input(s): PROCALCITONIN,  WBC,  LACTICIDVEN Microbiology Recent Results (from the past 240 hour(s))  Resp Panel by RT-PCR (Flu A&B, Covid) Nasopharyngeal Swab     Status: None   Collection Time: 10/02/20  3:46 AM   Specimen: Nasopharyngeal Swab; Nasopharyngeal(NP) swabs in vial transport medium  Result Value Ref Range Status   SARS Coronavirus 2 by RT PCR NEGATIVE NEGATIVE Final    Comment:  (NOTE) SARS-CoV-2 target nucleic acids are NOT DETECTED.  The SARS-CoV-2 RNA is generally detectable in upper respiratory specimens during the acute phase of infection. The lowest concentration of SARS-CoV-2 viral copies this assay can detect is 138 copies/mL. A negative result does not preclude SARS-Cov-2 infection and should not be used as the sole basis for treatment or other patient management decisions. A negative result may occur with  improper specimen collection/handling, submission of specimen other than nasopharyngeal swab, presence of viral mutation(s) within the areas targeted by this assay, and inadequate number of viral copies(<138 copies/mL). A negative result must be combined with clinical observations, patient history, and epidemiological information. The expected result is Negative.  Fact Sheet for Patients:  EntrepreneurPulse.com.au  Fact Sheet for Healthcare Providers:  IncredibleEmployment.be  This test is no t yet approved or cleared by the [place] FDA and  has been authorized for detection and/or diagnosis of SARS-CoV-2 by FDA under an Emergency Use Authorization (EUA). This EUA will remain  in effect (meaning this test can be used) for the duration of the COVID-19 declaration under Section 564(b)(1) of the Act, 21 U.S.C.section 360bbb-3(b)(1), unless the authorization is terminated  or revoked sooner.       Influenza A by PCR NEGATIVE NEGATIVE Final   Influenza B by PCR NEGATIVE NEGATIVE Final    Comment: (NOTE) The Xpert Xpress SARS-CoV-2/FLU/RSV plus assay is intended as an aid in the diagnosis of influenza from Nasopharyngeal swab specimens and should not be used as a sole basis for treatment. Nasal washings and aspirates are unacceptable for Xpert Xpress SARS-CoV-2/FLU/RSV testing.  Fact Sheet for Patients: EntrepreneurPulse.com.au  Fact Sheet for Healthcare  Providers: IncredibleEmployment.be  This test is not yet approved or cleared by the [place] FDA and has been authorized for detection  and/or diagnosis of SARS-CoV-2 by FDA under an Emergency Use Authorization (EUA). This EUA will remain in effect (meaning this test can be used) for the duration of the COVID-19 declaration under Section 564(b)(1) of the Act, 21 U.S.C. section 360bbb-3(b)(1), unless the authorization is terminated or revoked.  Performed at [facility], [place] [address] [place], [place] [address]   MRSA Next Gen by PCR, Nasal     Status: None   Collection Time: 10/02/20  9:08 AM   Specimen: Nasal Mucosa; Nasal Swab  Result Value Ref Range Status   MRSA by PCR Next Gen NOT DETECTED NOT DETECTED Final    Comment: (NOTE) The GeneXpert MRSA Assay (FDA approved for NASAL specimens only), is one component of a comprehensive MRSA colonization surveillance program. It is not intended to diagnose MRSA infection nor to guide or monitor treatment for MRSA infections. Test performance is not FDA approved in patients less than 49 years old. Performed at [facility], [place] [address], [place], [place] [address]   Culture, Respiratory w Gram Stain     Status: None   Collection Time: 10/02/20 11:21 AM   Specimen: Tracheal Aspirate; Respiratory  Result Value Ref Range Status   Specimen Description   Final    TRACHEAL ASPIRATE Performed at [facility] [address] [place], [place] [address]    Special Requests   Final    NONE Performed at [facility], [place] [address] [place], [place] [address]    Gram Stain   Final    FEW WBC PRESENT, PREDOMINANTLY PMN FEW GRAM POSITIVE COCCI IN CHAINS IN CLUSTERS FEW GRAM NEGATIVE RODS RARE YEAST RARE GRAM POSITIVE RODS    Culture   Final    ABUNDANT Normal respiratory flora-no Staph aureus or Pseudomonas seen Performed at [facility], [address] [place], [place] [address]    Report Status 10/05/2020 FINAL  Final     Time coordinating discharge: Over 30 minutes  SIGNED:   Jasman Murri J [place] DO  [facility] 10/10/2020, 8:27 AM

## 2020-10-10 NOTE — Progress Notes (Signed)
Patient was given discharge instructions, and all questions were answered.  Patient was stable for discharge and was taken to the main exit by wheelchair. 

## 2020-10-11 ENCOUNTER — Telehealth: Payer: Self-pay

## 2020-10-11 LAB — HEMOGLOBIN A1C
Hgb A1c MFr Bld: 11.5 % — ABNORMAL HIGH (ref 4.8–5.6)
Mean Plasma Glucose: 283 mg/dL

## 2020-10-11 NOTE — Telephone Encounter (Signed)
Transition Care Management Follow-up Telephone Call   Date of discharge and from where: [facility] 07 /25/2022 How have you been since you were released from the hospital? Felling better/ checked BG 289 at this time stated is about to eat and take his insulin  Any questions or concerns? No questions/concerns reported.  Items Reviewed: Did the pt receive and understand the discharge instructions provided? have the instructions and have no questions.  Medications obtained and verified? He said that they have the medication list  and the hospital staff reviewed them in detail prior to discharge. Had questions about Novolog dosage . Reinforced pt dosage instructions as per MD ED provider. Verbalized understanding . He said that he has all of the medications. Any new allergies since your discharge? None reported  Do you have support at home? Yes, girlfriend and family  Other (ie: DME, Home Health, etc)     None    Functional Questionnaire: (I = Independent and D = Dependent) ADL's:  Independent.        Follow up appointments reviewed:   PCP Hospital f/u appt confirmed? Pt scheduled with NP Geryl Rankins 10/19/2020@ 1050.  [facility] f/u appt confirmed? scheduled at this time  Are transportation arrangements needed? have transportation   If their condition worsens, is the pt aware to call  their PCP or go to the ED? Yes.Made pt aware if condition worsen or start experiencing rapid weight gain, chest pain, diff breathing, SOB, high fevers, or bleading to refer imediately to ED for further evaluation.  Was the patient provided with contact information for the PCP's office or ED? He has the phone number  Was the pt encouraged to call back with questions or concerns?yes

## 2020-10-12 LAB — GLUCOSE, CAPILLARY: Glucose-Capillary: >600 mg/dL (ref 70–99)

## 2020-10-19 ENCOUNTER — Encounter: Payer: Self-pay | Admitting: Nurse Practitioner

## 2020-10-19 ENCOUNTER — Ambulatory Visit: Payer: Medicaid Other | Attending: Nurse Practitioner | Admitting: Nurse Practitioner

## 2020-10-19 ENCOUNTER — Other Ambulatory Visit: Payer: Self-pay

## 2020-10-19 DIAGNOSIS — Z794 Long term (current) use of insulin: Secondary | ICD-10-CM | POA: Diagnosis not present

## 2020-10-19 DIAGNOSIS — Z09 Encounter for follow-up examination after completed treatment for conditions other than malignant neoplasm: Secondary | ICD-10-CM

## 2020-10-19 DIAGNOSIS — IMO0002 Reserved for concepts with insufficient information to code with codable children: Secondary | ICD-10-CM

## 2020-10-19 DIAGNOSIS — E1165 Type 2 diabetes mellitus with hyperglycemia: Secondary | ICD-10-CM

## 2020-10-19 MED ORDER — FREESTYLE LIBRE 14 DAY SENSOR MISC: 6 refills | Status: DC

## 2020-10-19 MED ORDER — METHOCARBAMOL 500 MG PO TABS: 500.0000 mg | ORAL_TABLET | Freq: Four times a day (QID) | ORAL | 1 refills | Status: DC

## 2020-10-19 MED ORDER — FREESTYLE LIBRE 14 DAY READER DEVI: 0 refills | Status: DC

## 2020-10-19 NOTE — Progress Notes (Signed)
Virtual Visit via Telephone Note Due to national recommendations of social distancing due to [place] 19, telehealth visit is felt to be most appropriate for this patient at this time.  I discussed the limitations, risks, security and privacy concerns of performing an evaluation and management service by telephone and the availability of in person appointments. I also discussed with the patient that there may be a patient responsible charge related to this service. The patient expressed understanding and agreed to proceed.    I connected with Luke [name] on 10/19/20  at  10:50 AM EDT  EDT by telephone and verified that I am speaking with the correct person using two identifiers.  Location of Patient: Private Residence   Location of Provider: [place] participating in Telemedicine visit: Geryl Rankins FNP-BC [place]    History of Present Illness: Telemedicine visit for: HFU  Admitted to the hospital on 7-17 and discharged on 10/10/2020.  While in the hospital he was treated for diabetic ketoacidosis requiring insulin drip.  He was sent home on his usual dose of Lantus 40 units twice daily and NovoLog sliding scale.  Hospital course was complicated by acute on chronic renal failure requiring aggressive IV fluid hydration with creatinine returning back to baseline at discharge.  Luke [name] also require intubation due to aspiration pneumonia/pneumonitis and was started on IV antibiotics and continued on p.o. Augmentin upon discharge. UDS was positive for amphetamines, benzodiazepines and THC.  He was continued on Suboxone and instructed to follow-up as outpatient.  Suboxone was not prescribed by his primary care prior to his hospital admission. Per ED report:Attempts to sedate the patient in the emergency room included IM Haldol, Versed, Geodon, Benadryl and ketamine prior to intubation.  Sedation was hampered due to IV access.  Intubation was  difficult requiring anesthesia.  It is unknown what his drug ingestion might have been.  Urine drug screen was positive for amphetamines benzodiazepine and THC.  Patient did vomit during initial intubation attempts   Today he states he has been checking his blood sugar sporadically. Level is low low: 70.  Highest high:Registering high on meter unable to read number.  He is administering Lantus 40 units twice daily and NovoLog sliding scale as prescribed.  I have ordered the freestyle libre with hopes that this will help him monitor his blood glucose levels more closely and frequently for better adherence. Lab Results  Component Value Date   HGBA1C 11.5 (H) 10/10/2020     Past Medical History:  Diagnosis Date   Anemia    Anxiety  Dx 2008   Cellulitis    Chronic kidney disease    Diabetes mellitus without complication ([place]) Dx [id number]   DKA (diabetic ketoacidoses) 07/21/2017   Endocarditis 04/2019   Endocarditis    GERD (gastroesophageal reflux disease) Dx 2008   Headache(784.0)    Heart murmur    Hepatitis C    Hypertension    Hypothyroidism    MRSA infection    Necrotizing fasciitis of multiple sites ([place])    Pneumonia    Seizures ([place]) 2011   x 2 in lifetime. on Dilantin for a while.    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, multiple relapses   Type 1 diabetes [facility])     Past Surgical History:  Procedure Laterality Date   AMPUTATION Left 09/05/2018   Procedure: LEFT GREAT TOE AMPUTATION;  Surgeon: [name], MD;  Location: [place];  Service: Orthopedics;  Laterality: Left;   APPLICATION OF A-CELL OF EXTREMITY Bilateral 09/01/2019   Procedure: APPLICATION OF MESHED PRIMATRIX AG OF EXTREMITY;  Surgeon: Cindra Presume, MD;  Location: [place];  Service: Plastics;  Laterality: Bilateral;   I & D EXTREMITY Left 10/11/2012   Procedure: IRRIGATION AND DEBRIDEMENT ABSCESS FOREARM;  Surgeon: Linna Hoff, MD;  Location: [place];  Service: Orthopedics;  Laterality: Left;    I & D EXTREMITY Left 10/12/2012   Procedure: IRRIGATION AND DEBRIDEMENT FOREARM;  Surgeon: Linna Hoff, MD;  Location: [place];  Service: Orthopedics;  Laterality: Left;   I & D EXTREMITY Left 10/14/2012   Procedure: incision and drainage left forearm;  Surgeon: Linna Hoff, MD;  Location: [place];  Service: Orthopedics;  Laterality: Left;   I & D EXTREMITY Left 10/16/2012   Procedure: IRRIGATION AND DEBRIDEMENT LEFT FOREARM;  Surgeon: Linna Hoff, MD;  Location: [place];  Service: Orthopedics;  Laterality: Left;   I & D EXTREMITY Left 10/20/2012   Procedure: INCISION AND DRAINAGE AND DEBRIDEMENT LEFT  FOREARM;  Surgeon: Linna Hoff, MD;  Location: [place];  Service: Orthopedics;  Laterality: Left;   I & D EXTREMITY Right 05/22/2019   Procedure: IRRIGATION AND DEBRIDEMENT RIGHT ARM;  Surgeon: [name], MD;  Location: [place];  Service: Orthopedics;  Laterality: Right;   INCISION AND DRAINAGE OF WOUND Bilateral 11/29/2017   Procedure: DEBRIDEMENT BILATERAL FEET, DEBRIDEMENT LEFT ANKLE, AND APPLY WOUND VAC;  Surgeon: [name], MD;  Location: [place];  Service: Orthopedics;  Laterality: Bilateral;   INCISION AND DRAINAGE OF WOUND Bilateral 09/01/2019   Procedure: IRRIGATION AND DEBRIDEMENT FOREARM WOUNDS;  Surgeon: Cindra Presume, MD;  Location: [place];  Service: Plastics;  Laterality: Bilateral;   IRRIGATION AND DEBRIDEMENT ABSCESS     Hx: of left arm abscess related to drug use    TEE WITHOUT CARDIOVERSION N/A 11/28/2017   Procedure: TRANSESOPHAGEAL ECHOCARDIOGRAM (TEE);  Surgeon: Sanda Klein, MD;  Location: [place];  Service: Cardiovascular;  Laterality: N/A;   TEE WITHOUT CARDIOVERSION N/A 05/12/2019   Procedure: TRANSESOPHAGEAL ECHOCARDIOGRAM (TEE);  Surgeon: Skeet Latch, MD;  Location: [facility] ENDOSCOPY;  Service: Cardiovascular;  Laterality: N/A;    Family History  Problem Relation Age of Onset   Diabetes Father    Heart disease Father    Mental illness Sister    Cancer  Neg Hx     Social History   Socioeconomic History   Marital status: Married    Spouse name: Not on file   Number of children: 6   Years of education: some colle   Highest education level: Not on file  Occupational History   Occupation: Event set up    Employer: [employer]    Comment: [employer]    Occupation: Stencil for cars   Tobacco Use   Smoking status: Every Day    Packs/day: 1.00    Years: 25.00    Pack years: 25.00    Types: Cigarettes   Smokeless tobacco: Never   Tobacco comments:    decreased over the last few weeks because he has not been breathing well  Vaping Use   Vaping Use: Some days  Substance and Sexual Activity   Alcohol use: No    Alcohol/week: 0.0 standard drinks    Comment: rarely   Drug use: Not Currently    Types: Cocaine, IV, Heroin    Comment: IVDU since Dad died in  2008 with 15 month period of sobriety 2018-2019, relapse Aug 2019.   Sexual activity: Yes    Partners: Female    Birth control/protection: None  Other Topics Concern   Not on file  Social History Narrative   Lives with wife and two children age 62 and 19.          Social Determinants of Health   Financial Resource Strain: Not on file  Food Insecurity: Not on file  Transportation Needs: Not on file  Physical Activity: Not on file  Stress: Not on file  Social Connections: Not on file     Observations/Objective: Awake, alert and oriented x 3   Review of Systems  Constitutional:  Negative for fever, malaise/fatigue and weight loss.  HENT: Negative.  Negative for nosebleeds.   Eyes: Negative.  Negative for blurred vision, double vision and photophobia.  Respiratory: Negative.  Negative for cough and shortness of breath.   Cardiovascular: Negative.  Negative for chest pain, palpitations and leg swelling.  Gastrointestinal: Negative.  Negative for heartburn, nausea and vomiting.  Musculoskeletal: Negative.  Negative for myalgias.  Neurological: Negative.  Negative for  dizziness, focal weakness, seizures and headaches.  Psychiatric/Behavioral:  Negative for suicidal ideas.    Assessment and Plan: Diagnoses and all orders for this visit:  Hospital discharge follow-up  Insulin dependent type 2 diabetes mellitus, uncontrolled ([place])  Other orders -     Continuous Blood Gluc Sensor (FREESTYLE LIBRE 14 DAY SENSOR) MISC; Use as instructed. Check blood glucose level by fingerstick 5 times per day. E11.65 Z79.4 -     Continuous Blood Gluc Receiver (FREESTYLE LIBRE 14 DAY READER) DEVI; Use as instructed. Check blood glucose level by fingerstick 5 times per day. E11.65 Z79.4 -     methocarbamol (ROBAXIN) 500 MG tablet; Take 1 tablet (500 mg total) by mouth 4 (four) times daily.    Follow Up Instructions Return in about 3 months (around 01/19/2021).     I discussed the assessment and treatment plan with the patient. The patient was provided an opportunity to ask questions and all were answered. The patient agreed with the plan and demonstrated an understanding of the instructions.   The patient was advised to call back or seek an in-person evaluation if the symptoms worsen or if the condition fails to improve as anticipated.  I provided 15 minutes of non-face-to-face time during this encounter including median intraservice time, reviewing previous notes, labs, imaging, medications and explaining diagnosis and management.  Gildardo Pounds, FNP-BC

## 2020-10-24 ENCOUNTER — Ambulatory Visit: Payer: Self-pay

## 2020-10-24 ENCOUNTER — Telehealth: Payer: Self-pay | Admitting: Nurse Practitioner

## 2020-10-24 NOTE — Telephone Encounter (Signed)
Pt called reporting that a PA is needed for his Continuous Blood Gluc Receiver (FREESTYLE LIBRE 14 DAY READER) DEVI & Continuous Blood Gluc Sensor (FREESTYLE LIBRE 14 DAY SENSOR) MISC  [id number] [place], [place] [id number] [id number] Phone: [phone]: [phone]

## 2020-10-24 NOTE — Telephone Encounter (Signed)
If appropriate, please change therapy to preferred

## 2020-10-24 NOTE — Telephone Encounter (Signed)
Patient called and says he has lower leg swelling for the past few days. He says last night he was not able to lie flat due to having SOB, he had to sleep upright. He says the swelling is both legs from the knees down, no pain, no redness. He reports SOB with activity, denies CP. I advised to go to the ED due to the SOB. He says he would prefer not to go there because he will be there all day and not seen until tonight. I advised based on the SOB, not able to lie down flat and the swelling, he will need evaluation and nothing could be done in the office for that, he verbalized understanding. Care advice given.  Reason for Disposition  [1] Difficulty breathing with exertion (e.g., walking) AND [2] new-onset or worsening  Answer Assessment - Initial Assessment Questions 1. ONSET: "When did the swelling start?" (e.g., minutes, hours, days)     Few days ago 2. LOCATION: "What part of the leg is swollen?"  "Are both legs swollen or just one leg?"     Knee down to both legs 3. SEVERITY: "How bad is the swelling?" (e.g., localized; mild, moderate, severe)  - Localized - small area of swelling localized to one leg  - MILD pedal edema - swelling limited to foot and ankle, pitting edema < 1/4 inch (6 mm) deep, rest and elevation eliminate most or all swelling  - MODERATE edema - swelling of lower leg to knee, pitting edema > 1/4 inch (6 mm) deep, rest and elevation only partially reduce swelling  - SEVERE edema - swelling extends above knee, facial or hand swelling present      Moderate 4. REDNESS: "Does the swelling look red or infected?"     No 5. PAIN: "Is the swelling painful to touch?" If Yes, ask: "How painful is it?"   (Scale 1-10; mild, moderate or severe)     No 6. FEVER: "Do you have a fever?" If Yes, ask: "What is it, how was it measured, and when did it start?"      No 7. CAUSE: "What do you think is causing the leg swelling?"     Need heart surgery 8. MEDICAL HISTORY: "Do you have a  history of heart failure, kidney disease, liver failure, or cancer?"     Yes 9. RECURRENT SYMPTOM: "Have you had leg swelling before?" If Yes, ask: "When was the last time?" "What happened that time?"     Yes about a year ago 10. OTHER SYMPTOMS: "Do you have any other symptoms?" (e.g., chest pain, difficulty breathing)       SOB 11. PREGNANCY: "Is there any chance you are pregnant?" "When was your last menstrual period?"       N/A  Protocols used: Leg Swelling and Edema-A-AH

## 2020-10-25 ENCOUNTER — Other Ambulatory Visit: Payer: Self-pay | Admitting: Nurse Practitioner

## 2020-10-25 MED ORDER — FREESTYLE LIBRE 2 READER DEVI
11 refills | Status: DC
Start: 2020-10-25 — End: 2021-01-27

## 2020-10-25 MED ORDER — FREESTYLE LIBRE 2 SENSOR MISC: 11 refills | Status: DC

## 2020-10-26 NOTE — Telephone Encounter (Signed)
Agree. Needs to be evaluated in the ER

## 2020-11-02 ENCOUNTER — Other Ambulatory Visit: Payer: Self-pay

## 2020-11-17 ENCOUNTER — Other Ambulatory Visit: Payer: Self-pay | Admitting: Nurse Practitioner

## 2020-11-17 ENCOUNTER — Other Ambulatory Visit: Payer: Self-pay

## 2020-11-18 ENCOUNTER — Other Ambulatory Visit: Payer: Self-pay | Admitting: Nurse Practitioner

## 2020-11-18 ENCOUNTER — Other Ambulatory Visit: Payer: Self-pay

## 2020-11-18 ENCOUNTER — Other Ambulatory Visit: Payer: Self-pay | Admitting: Pharmacist

## 2020-11-18 MED ORDER — LANTUS SOLOSTAR 100 UNIT/ML ~~LOC~~ SOPN
40.0000 [IU] | PEN_INJECTOR | Freq: Two times a day (BID) | SUBCUTANEOUS | 1 refills | Status: DC
  Filled 2020-11-18: qty 30, 38d supply, fill #0

## 2020-11-18 NOTE — Telephone Encounter (Signed)
   Notes to clinic This medication has a newer [facility] which [facility], please assess.

## 2020-12-21 ENCOUNTER — Other Ambulatory Visit: Payer: Self-pay

## 2021-01-19 ENCOUNTER — Observation Stay (HOSPITAL_COMMUNITY)
Admission: EM | Admit: 2021-01-19 | Discharge: 2021-01-21 | Disposition: A | Discharge status: Home / Self Care | Payer: Medicaid Other | Attending: Family Medicine | Admitting: Family Medicine

## 2021-01-19 ENCOUNTER — Observation Stay (HOSPITAL_COMMUNITY): Payer: Medicaid Other

## 2021-01-19 ENCOUNTER — Other Ambulatory Visit: Payer: Self-pay

## 2021-01-19 DIAGNOSIS — I10 Essential (primary) hypertension: Secondary | ICD-10-CM

## 2021-01-19 DIAGNOSIS — D72829 Elevated white blood cell count, unspecified: Secondary | ICD-10-CM | POA: Insufficient documentation

## 2021-01-19 DIAGNOSIS — I129 Hypertensive chronic kidney disease with stage 1 through stage 4 chronic kidney disease, or unspecified chronic kidney disease: Secondary | ICD-10-CM | POA: Insufficient documentation

## 2021-01-19 DIAGNOSIS — Z20822 Contact with and (suspected) exposure to covid-19: Secondary | ICD-10-CM | POA: Insufficient documentation

## 2021-01-19 DIAGNOSIS — E109 Type 1 diabetes mellitus without complications: Secondary | ICD-10-CM

## 2021-01-19 DIAGNOSIS — F1194 Opioid use, unspecified with opioid-induced mood disorder: Secondary | ICD-10-CM | POA: Diagnosis not present

## 2021-01-19 DIAGNOSIS — E1065 Type 1 diabetes mellitus with hyperglycemia: Principal | ICD-10-CM | POA: Insufficient documentation

## 2021-01-19 DIAGNOSIS — F1121 Opioid dependence, in remission: Secondary | ICD-10-CM

## 2021-01-19 DIAGNOSIS — F1994 Other psychoactive substance use, unspecified with psychoactive substance-induced mood disorder: Secondary | ICD-10-CM

## 2021-01-19 DIAGNOSIS — E1022 Type 1 diabetes mellitus with diabetic chronic kidney disease: Secondary | ICD-10-CM | POA: Diagnosis not present

## 2021-01-19 DIAGNOSIS — F1920 Other psychoactive substance dependence, uncomplicated: Secondary | ICD-10-CM | POA: Insufficient documentation

## 2021-01-19 DIAGNOSIS — G9340 Encephalopathy, unspecified: Secondary | ICD-10-CM | POA: Insufficient documentation

## 2021-01-19 DIAGNOSIS — E039 Hypothyroidism, unspecified: Secondary | ICD-10-CM | POA: Insufficient documentation

## 2021-01-19 DIAGNOSIS — N1831 Chronic kidney disease, stage 3a: Secondary | ICD-10-CM

## 2021-01-19 DIAGNOSIS — N183 Chronic kidney disease, stage 3 unspecified: Secondary | ICD-10-CM | POA: Insufficient documentation

## 2021-01-19 DIAGNOSIS — E162 Hypoglycemia, unspecified: Secondary | ICD-10-CM | POA: Diagnosis present

## 2021-01-19 DIAGNOSIS — Y900 Blood alcohol level of less than 20 mg/100 ml: Secondary | ICD-10-CM | POA: Diagnosis not present

## 2021-01-19 DIAGNOSIS — F1910 Other psychoactive substance abuse, uncomplicated: Secondary | ICD-10-CM | POA: Diagnosis present

## 2021-01-19 LAB — BASIC METABOLIC PANEL
Anion gap: 10 (ref 5–15)
BUN: 22 mg/dL — ABNORMAL HIGH (ref 6–20)
CO2: 24 mmol/L (ref 22–32)
Calcium: 9.2 mg/dL (ref 8.9–10.3)
Chloride: 106 mmol/L (ref 98–111)
Creatinine, Ser: 1.26 mg/dL — ABNORMAL HIGH (ref 0.61–1.24)
GFR, Estimated: >60 mL/min (ref >60)
Glucose, Bld: 33 mg/dL — CL (ref 70–99)
Potassium: 3.5 mmol/L (ref 3.5–5.1)
Sodium: 140 mmol/L (ref 135–145)

## 2021-01-19 LAB — CBC
HCT: 40.5 % (ref 39.0–52.0)
Hemoglobin: 13.5 g/dL (ref 13.0–17.0)
MCH: 27.9 pg (ref 26.0–34.0)
MCHC: 33.3 g/dL (ref 30.0–36.0)
MCV: 83.7 fL (ref 80.0–100.0)
Platelets: 370 10*3/uL (ref 150–400)
RBC: 4.84 MIL/uL (ref 4.22–5.81)
RDW: 13.6 % (ref 11.5–15.5)
WBC: 17.7 10*3/uL — ABNORMAL HIGH (ref 4.0–10.5)
nRBC: 0 % (ref 0.0–0.2)

## 2021-01-19 LAB — URINALYSIS, ROUTINE W REFLEX MICROSCOPIC
Bacteria, UA: NONE SEEN
Bilirubin Urine: NEGATIVE
Glucose, UA: 50 mg/dL — AB
Ketones, ur: 5 mg/dL — AB
Leukocytes,Ua: NEGATIVE
Nitrite: NEGATIVE
Protein, ur: 100 mg/dL — AB
Specific Gravity, Urine: 1.021 (ref 1.005–1.030)
pH: 5 (ref 5.0–8.0)

## 2021-01-19 LAB — CBG MONITORING, ED
Glucose-Capillary: 323 mg/dL — ABNORMAL HIGH (ref 70–99)
Glucose-Capillary: 209 mg/dL — ABNORMAL HIGH (ref 70–99)
Glucose-Capillary: 30 mg/dL — CL (ref 70–99)
Glucose-Capillary: 128 mg/dL — ABNORMAL HIGH (ref 70–99)

## 2021-01-19 LAB — ETHANOL: Alcohol, Ethyl (B): <10 mg/dL (ref <10)

## 2021-01-19 LAB — RAPID URINE DRUG SCREEN, HOSP PERFORMED
Amphetamines: NOT DETECTED
Barbiturates: NOT DETECTED
Benzodiazepines: NOT DETECTED
Cocaine: POSITIVE — AB
Opiates: POSITIVE — AB
Tetrahydrocannabinol: NOT DETECTED

## 2021-01-19 LAB — ACETAMINOPHEN LEVEL: Acetaminophen (Tylenol), Serum: <10 ug/mL — ABNORMAL LOW (ref 10–30)

## 2021-01-19 LAB — SALICYLATE LEVEL: Salicylate Lvl: <7 mg/dL — ABNORMAL LOW (ref 7.0–30.0)

## 2021-01-19 MED ORDER — ACETAMINOPHEN 325 MG PO TABS: 650.0000 mg | ORAL_TABLET | Freq: Four times a day (QID) | ORAL | Status: DC | PRN

## 2021-01-19 MED ORDER — ENOXAPARIN SODIUM 40 MG/0.4ML IJ SOSY
40.0000 mg | PREFILLED_SYRINGE | INTRAMUSCULAR | Status: DC
  Administered 2021-01-20: 40 mg via SUBCUTANEOUS
  Filled 2021-01-19: qty 0.4

## 2021-01-19 MED ORDER — ACETAMINOPHEN 650 MG RE SUPP: 650.0000 mg | Freq: Four times a day (QID) | RECTAL | Status: DC | PRN

## 2021-01-19 MED ORDER — POLYETHYLENE GLYCOL 3350 17 G PO PACK: 17.0000 g | PACK | Freq: Every day | ORAL | Status: DC | PRN

## 2021-01-19 MED ORDER — DEXTROSE 50 % IV SOLN
INTRAVENOUS | Status: AC
  Filled 2021-01-19: qty 50

## 2021-01-19 MED ORDER — GLUCAGON HCL RDNA (DIAGNOSTIC) 1 MG IJ SOLR
1.0000 mg | Freq: Once | INTRAMUSCULAR | Status: DC
  Filled 2021-01-19: qty 1

## 2021-01-19 MED ORDER — SODIUM CHLORIDE 0.9% FLUSH
3.0000 mL | Freq: Two times a day (BID) | INTRAVENOUS | Status: DC
  Administered 2021-01-20 – 2021-01-21 (×3): 3 mL via INTRAVENOUS

## 2021-01-19 NOTE — Progress Notes (Signed)
Arrived to patient's BS. Patient refused any assessment for PIV placement/lab draw. Holding arms close to body, and then attempting to get up out of bed. Notified charge nurse. Will complete consult at this time. Please reconsult when patient agreeable. Fran Lowes, RN VAST

## 2021-01-19 NOTE — ED Provider Notes (Signed)
[facility] EMERGENCY DEPARTMENT Provider Note   CSN: [id number] Arrival date & time: 01/19/21  1706     History Chief Complaint  Patient presents with   Hypoglycemia   Altered Mental Status    Luke [name] is a 40 y.o. male.  40 year old male presents today via EMS for evaluation of hyperglycemia, altered mental status and agitation.  EMS was called by patient's spouse who upon returning home noticed patient was lethargic and not acting like himself.  EMS on arrival found patient to be hypoglycemic with blood sugar of 38.  They provided patient with glucagon with him provement in patient's lethargy, the patient became agitated and combative.  Patient was given 5 mg of IM Haldol on arrival with improvement in agitation and was put in four-point restraint.  Patient remained nonverbal with EMS as well as on my interview with him on arrival to emergency room.   Collateral obtained from wife.  Wife reports patient since discharge and has been in a Suboxone program which he over the past week has not been attending.  She reports she is found pipes and other evidence that he may be using.  She reports he has been noncompliant with his insulin regimen.  She reports over the past week she has found them to be impaired and believes he is using methamphetamine as well as heroin.  She reports today she came home with patient alert but not responding and diaphoretic.  She believed he was hypoglycemic but could not get him to eat anything so she activated EMS.  The history is provided by the EMS personnel and the police. The history is limited by the condition of the patient.      Past Medical History:  Diagnosis Date   Anemia    Anxiety  Dx 2008   Cellulitis    Chronic kidney disease    Diabetes mellitus without complication ([place]) Dx 4401   DKA (diabetic ketoacidoses) 07/21/2017   Endocarditis 04/2019   Endocarditis    GERD (gastroesophageal reflux disease) Dx 2008    Headache(784.0)    Heart murmur    Hepatitis C    Hypertension    Hypothyroidism    MRSA infection    Necrotizing fasciitis of multiple sites ([place])    Pneumonia    Seizures ([place]) 2011   x 2 in lifetime. on Dilantin for a while.    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   Acute bacterial endocarditis    Heroin abuse ([place]) 05/09/2019   Cellulitis of forearms    Hypothyroidism    Diabetic ulcer of left foot ([place]) 08/28/2018   Hepatitis C 11/25/2017   GERD (gastroesophageal reflux disease) 07/21/2017   Diabetic polyneuropathy associated with type 1 diabetes mellitus ([place])    Noncompliant bladder    Urinary retention    ARF (acute renal failure) ([place]) 12/16/2016   Anemia 12/16/2016   Cocaine abuse ([place]) 08/16/2015   Uncontrolled insulin dependent type 1 diabetes mellitus 08/16/2015   Cellulitis 12/03/2014   AKI (acute kidney injury) ([place]) 12/03/2014  Elevated liver enzymes 07/26/2014   CKD (chronic kidney disease) stage 3, GFR 30-59 ml/min ([name]) 10/14/2012   Polysubstance abuse ([place]) 10/11/2012   Tobacco abuse 10/11/2012   Essential hypertension 10/11/2012    Past Surgical History:  Procedure Laterality Date   AMPUTATION Left 09/05/2018   Procedure: LEFT GREAT TOE AMPUTATION;  Surgeon: [name], MD;  Location: [facility];  Service: Orthopedics;  Laterality: Left;   APPLICATION OF A-CELL OF EXTREMITY Bilateral 09/01/2019   Procedure: APPLICATION OF MESHED PRIMATRIX AG OF EXTREMITY;  Surgeon: Cindra Presume, MD;  Location: [facility];  Service: Plastics;  Laterality: Bilateral;   I & D EXTREMITY Left  10/11/2012   Procedure: IRRIGATION AND DEBRIDEMENT ABSCESS FOREARM;  Surgeon: Linna Hoff, MD;  Location: [facility];  Service: Orthopedics;  Laterality: Left;   I & D EXTREMITY Left 10/12/2012   Procedure: IRRIGATION AND DEBRIDEMENT FOREARM;  Surgeon: Linna Hoff, MD;  Location: [facility];  Service: Orthopedics;  Laterality: Left;   I & D EXTREMITY Left 10/14/2012   Procedure: incision and drainage left forearm;  Surgeon: Linna Hoff, MD;  Location: [place];  Service: Orthopedics;  Laterality: Left;   I & D EXTREMITY Left 10/16/2012   Procedure: IRRIGATION AND DEBRIDEMENT LEFT FOREARM;  Surgeon: Linna Hoff, MD;  Location: [facility];  Service: Orthopedics;  Laterality: Left;   I & D EXTREMITY Left 10/20/2012   Procedure: INCISION AND DRAINAGE AND DEBRIDEMENT LEFT  FOREARM;  Surgeon: Linna Hoff, MD;  Location: [facility];  Service: Orthopedics;  Laterality: Left;   I & D EXTREMITY Right 05/22/2019   Procedure: IRRIGATION AND DEBRIDEMENT RIGHT ARM;  Surgeon: [name], MD;  Location: [place];  Service: Orthopedics;  Laterality: Right;   INCISION AND DRAINAGE OF WOUND Bilateral 11/29/2017   Procedure: DEBRIDEMENT BILATERAL FEET, DEBRIDEMENT LEFT ANKLE, AND APPLY WOUND VAC;  Surgeon: [name], MD;  Location: [facility];  Service: Orthopedics;  Laterality: Bilateral;   INCISION AND DRAINAGE OF WOUND Bilateral 09/01/2019   Procedure: IRRIGATION AND DEBRIDEMENT FOREARM WOUNDS;  Surgeon: Cindra Presume, MD;  Location: [facility];  Service: Plastics;  Laterality: Bilateral;   IRRIGATION AND DEBRIDEMENT ABSCESS     Hx: of left arm abscess related to drug use    TEE WITHOUT CARDIOVERSION N/A 11/28/2017   Procedure: TRANSESOPHAGEAL ECHOCARDIOGRAM (TEE);  Surgeon: Sanda Klein, MD;  Location: [facility];  Service: Cardiovascular;  Laterality: N/A;   TEE WITHOUT CARDIOVERSION N/A 05/12/2019   Procedure: TRANSESOPHAGEAL ECHOCARDIOGRAM (TEE);  Surgeon: Skeet Latch, MD;  Location: [facility] ENDOSCOPY;  Service:  Cardiovascular;  Laterality: N/A;       Family History  Problem Relation Age of Onset   Diabetes Father    Heart disease Father    Mental illness Sister    Cancer Neg Hx     Social History   Tobacco Use   Smoking status: Every Day    Packs/day: 1.00    Years: 25.00    Pack years: 25.00    Types: Cigarettes   Smokeless tobacco: Never   Tobacco comments:    decreased over the last few weeks because he has not been breathing well  Vaping Use   Vaping Use: Some days  Substance Use Topics   Alcohol use: No    Alcohol/week: 0.0 standard drinks    Comment: rarely   Drug use: Not Currently    Types: Cocaine, IV, Heroin    Comment: IVDU since Dad died in 06/09/06  with 15 month period of sobriety 2018-2019, relapse Aug 2019.    Home Medications Prior to Admission medications   Medication Sig Start Date End Date Taking? Authorizing Provider  Blood Glucose Monitoring Suppl (ACCU-CHEK GUIDE ME) w/Device KIT Use to check blood sugar TID. E11.65 01/14/20   Charlott Rakes, MD  Continuous Blood Gluc Receiver (FREESTYLE LIBRE 14 DAY READER) DEVI Use as instructed. Check blood glucose level by fingerstick 5 times per day. E11.65 Z79.4 10/19/20   Gildardo Pounds, NP  Continuous Blood Gluc Receiver (FREESTYLE LIBRE 2 READER) DEVI Use as instructed. Check blood glucose level by fingerstick 5 times per day. E11.65 Z79.4 10/25/20   Gildardo Pounds, NP  Continuous Blood Gluc Sensor (FREESTYLE LIBRE 14 DAY SENSOR) MISC Use as instructed. Check blood glucose level by fingerstick 5 times per day. E11.65 Z79.4 10/19/20   Gildardo Pounds, NP  Continuous Blood Gluc Sensor (FREESTYLE LIBRE 2 SENSOR) MISC Use as instructed. Check blood glucose level by fingerstick 5 times per day. E11.65 Z79.4 10/25/20   Gildardo Pounds, NP  insulin aspart (NOVOLOG) 100 UNIT/ML FlexPen Inject 2-10 Units into the skin 3 (three) times daily with meals. Glucose 151-200 take 2 units, glucose 201-250 take 4 units, glucose 251-300 take  6 units, glucose 301-350 take 8 units, glucose 351-400 take 10 units 10/10/20 01/08/21  [place] Eric J, DO  insulin glargine (LANTUS SOLOSTAR) 100 UNIT/ML Solostar Pen Inject 40 Units into the skin 2 (two) times daily. 11/18/20 02/01/21  Charlott Rakes, MD  Insulin Pen Needle (TRUEPLUS 5-BEVEL PEN NEEDLES) 32G X 4 MM MISC Use as instructed to inject insulin. 11/27/19   Charlott Rakes, MD  methocarbamol (ROBAXIN) 500 MG tablet Take 1 tablet (500 mg total) by mouth 4 (four) times daily. 10/19/20   Gildardo Pounds, NP  SUBOXONE 8-2 MG FILM SMARTSIG:1 Strip(s) By Mouth 3 Times Daily 07/14/20   [provider]    Allergies    Patient has no known allergies.  Review of Systems   Review of Systems  Unable to perform ROS: Mental status change   Physical Exam Updated Vital Signs BP (!) 171/104 (BP Location: Left Arm)   Pulse (!) 105   Temp 97.7 F (36.5 C) (Axillary)   Resp 17   SpO2 98%   Physical Exam Vitals and nursing note reviewed.  Constitutional:      General: He is not in acute distress.    Appearance: Normal appearance. He is not ill-appearing.  HENT:     Head: Normocephalic and atraumatic.     Nose: Nose normal.  Eyes:     General: No scleral icterus.    Extraocular Movements: Extraocular movements intact.     Conjunctiva/sclera: Conjunctivae normal.  Cardiovascular:     Rate and Rhythm: Regular rhythm.     Pulses: Normal pulses.  Pulmonary:     Effort: Pulmonary effort is normal. No respiratory distress.     Breath sounds: Normal breath sounds. No wheezing or rales.  Abdominal:     General: There is no distension.     Tenderness: There is no abdominal tenderness.  Musculoskeletal:        General: Normal range of motion.     Cervical back: Normal range of motion.     Right lower leg: No edema.     Left lower leg: No edema.  Skin:    General: Skin is warm and dry.  Neurological:     General: No focal deficit present.  Mental Status: He is alert. Mental  status is at baseline.    ED Results / Procedures / Treatments   Labs (all labs ordered are listed, but only abnormal results are displayed) Labs Reviewed  CBG MONITORING, ED - Abnormal; Notable for the following components:      Result Value   Glucose-Capillary 323 (*)    All other components within normal limits  RAPID URINE DRUG SCREEN, HOSP PERFORMED  CBC  BASIC METABOLIC PANEL  ACETAMINOPHEN LEVEL  SALICYLATE LEVEL  ETHANOL  URINALYSIS, ROUTINE W REFLEX MICROSCOPIC    EKG None  Radiology No results found.  Procedures Procedures   Medications Ordered in ED Medications - No data to display  ED Course  I have reviewed the triage vital signs and the nursing notes.  Pertinent labs & imaging results that were available during my care of the patient were reviewed by me and considered in my medical decision making (see chart for details).  Clinical Course as of 01/19/21 2241  Thu Jan 19, 2021  2115 Patient with recurrent episode of hypoglycemia. IV obtained. Amp of d50 given. Labs drawn. Patient somnolent and not engaging in interview.  [AA]    Clinical Course User Index [AA] Evlyn Courier, PA-C   MDM Rules/Calculators/A&P                           40 year old male with a history of heart failure, endocarditis, type 1 diabetes, polysubstance abuse presents today for evaluation of hyperglycemia and change in mental status and agitation.  Of note patient had a recent admission in July with DKA and delirium requiring intubation due to agitation.  Patient is nonverbal on exam.  He is without agitation at this time.  Patient does make eye contact when addressed but does not answer questions.  We will continue four-point restraints at this time.  Repeat glucose on arrival to emergency room is 323.  We will obtain other cbc, bmp, ethanol, salicylate, and acetaminophen level. Urine drug screen ordered.   Patient's work-up significant for creatinine 1.26, multiple hypoglycemic  episodes, white blood cell count of 17.7, UDS positive for opioids and cocaine.  Given recurrent hypoglycemic episodes will discuss with hospitalist for potential admission.  Patient is also IVC'd. TTS consult placed.   Case discussed with hospitalist who will evaluate patient for admission.  Final Clinical Impression(s) / ED Diagnoses Final diagnoses:  None    Rx / DC Orders ED Discharge Orders     None        Evlyn Courier, PA-C 01/20/21 0033    Lorelle Gibbs, DO 01/20/21 1622

## 2021-01-19 NOTE — H&P (Signed)
History and Physical   Luke [name] YWV:[id number] DOB: 24-Jul-1980 DOA: 01/19/2021  PCP: Gildardo Pounds, NP   Patient coming from: Home  Chief Complaint: Altered mental status, hypoglycemia  HPI: Luke [name] is a 40 y.o. male with medical history significant of cellulitis, bacterial endocarditis, polysubstance use, anemia, CKD 3, type 1 diabetes, hypertension, GERD, hepatitis C, hypothyroidism, anxiety who presents with altered mental status and hypoglycemia.  Patient was noted to be acting unusual and lethargic at home and spouse called EMS.  Found to be hypoglycemic glucagon given with improvement initially.  He then became aggressive with EMS and began hitting out and trying to bite he was given 5 mg of Haldol in route and placed in restraints.  He was noted to have a crack pipe on him on the scene.  Further history obtained with assistance of wife and chart review.  He had been in a Suboxone program since his recent admission due to need to quit heroin use.  He had not been attending this for the past week.  Wife is found alert prep around the house as well.  She is concerned that he is seemed more impaired for the last week and that he may be using heroin again and possibly methamphetamine as well.  Due to his altered mental status and concerns about him talking about dying although not talking about hurting himself.  He has been involuntary to be committed.  He has been intermittently conversive in the ED but other times refuses to respond.  He has been more calm in the ED and has been taken off restraints.  Inability to confirm review of systems due to patient's refusal to speak with me.  ED Course: Vital signs in the ED significant for heart rate in the 100s, blood pressure in the 854O to 270J systolic.  Lab work-up showed CMP with creatinine stable around 1.26 with glucose noted to be low at 33.  BUN 22.  Leukocytosis noted on CBC at 17.7.  UDS positive for opiates and cocaine.  Level  negative.  Urinalysis pending.  Tylenol level aspirin level normal.  CBGs showed initial glucose 338 and 209 and 128.  Patient received D50, glucagon in the ED.  Review of Systems: Unable to to confirm due to patient's refusal to speak.  Past Medical History:  Diagnosis Date   Anemia    Anxiety  Dx 2008   Cellulitis    Chronic kidney disease    Diabetes mellitus without complication ([place]) Dx 5009   DKA (diabetic ketoacidoses) 07/21/2017   Endocarditis 04/2019   Endocarditis    GERD (gastroesophageal reflux disease) Dx 2008   Headache(784.0)    Heart murmur    Hepatitis C    Hypertension    Hypothyroidism    MRSA infection    Necrotizing fasciitis of multiple sites ([place])    Pneumonia    Seizures ([place]) 2011   x 2 in lifetime. on Dilantin for a while.    Sepsis ([place])    Substance abuse ([place]) 2013   heroin use, multiple relapses   Type 1 diabetes [facility])     Past Surgical History:  Procedure Laterality Date   AMPUTATION Left 09/05/2018   Procedure: LEFT GREAT TOE AMPUTATION;  Surgeon: [name], MD;  Location: [place];  Service: Orthopedics;  Laterality: Left;   APPLICATION OF A-CELL OF EXTREMITY Bilateral 09/01/2019   Procedure: APPLICATION OF MESHED PRIMATRIX AG OF EXTREMITY;  Surgeon: Cindra Presume, MD;  Location: [facility]  OR;  Service: Clinical cytogeneticist;  Laterality: Bilateral;   I & D EXTREMITY Left 10/11/2012   Procedure: IRRIGATION AND DEBRIDEMENT ABSCESS FOREARM;  Surgeon: Linna Hoff, MD;  Location: [place];  Service: Orthopedics;  Laterality: Left;   I & D EXTREMITY Left 10/12/2012   Procedure: IRRIGATION AND DEBRIDEMENT FOREARM;  Surgeon: Linna Hoff, MD;  Location: [place];  Service: Orthopedics;  Laterality: Left;   I & D EXTREMITY Left 10/14/2012   Procedure: incision and drainage left forearm;  Surgeon: Linna Hoff, MD;  Location: [place];  Service: Orthopedics;  Laterality: Left;   I & D EXTREMITY Left 10/16/2012   Procedure: IRRIGATION AND DEBRIDEMENT LEFT FOREARM;  Surgeon:  Linna Hoff, MD;  Location: [place];  Service: Orthopedics;  Laterality: Left;   I & D EXTREMITY Left 10/20/2012   Procedure: INCISION AND DRAINAGE AND DEBRIDEMENT LEFT  FOREARM;  Surgeon: Linna Hoff, MD;  Location: [place];  Service: Orthopedics;  Laterality: Left;   I & D EXTREMITY Right 05/22/2019   Procedure: IRRIGATION AND DEBRIDEMENT RIGHT ARM;  Surgeon: [name], MD;  Location: [place];  Service: Orthopedics;  Laterality: Right;   INCISION AND DRAINAGE OF WOUND Bilateral 11/29/2017   Procedure: DEBRIDEMENT BILATERAL FEET, DEBRIDEMENT LEFT ANKLE, AND APPLY WOUND VAC;  Surgeon: [name], MD;  Location: [place];  Service: Orthopedics;  Laterality: Bilateral;   INCISION AND DRAINAGE OF WOUND Bilateral 09/01/2019   Procedure: IRRIGATION AND DEBRIDEMENT FOREARM WOUNDS;  Surgeon: Cindra Presume, MD;  Location: [place];  Service: Plastics;  Laterality: Bilateral;   IRRIGATION AND DEBRIDEMENT ABSCESS     Hx: of left arm abscess related to drug use    TEE WITHOUT CARDIOVERSION N/A 11/28/2017   Procedure: TRANSESOPHAGEAL ECHOCARDIOGRAM (TEE);  Surgeon: Sanda Klein, MD;  Location: [place];  Service: Cardiovascular;  Laterality: N/A;   TEE WITHOUT CARDIOVERSION N/A 05/12/2019   Procedure: TRANSESOPHAGEAL ECHOCARDIOGRAM (TEE);  Surgeon: Skeet Latch, MD;  Location: [place];  Service: Cardiovascular;  Laterality: N/A;    Social History  reports that he has been smoking cigarettes. He has a 25.00 pack-year smoking history. He has never used smokeless tobacco. He reports that he does not currently use drugs after having used the following drugs: Cocaine, IV, and Heroin. He reports that he does not drink alcohol.  No Known Allergies  Family History  Problem Relation Age of Onset   Diabetes Father    Heart disease Father    Mental illness Sister    Cancer Neg Hx   Reviewed on admission  Prior to Admission medications   Medication Sig Start Date End Date Taking? Authorizing  Provider  Blood Glucose Monitoring Suppl (ACCU-CHEK GUIDE ME) w/Device KIT Use to check blood sugar TID. E11.65 01/14/20   Charlott Rakes, MD  Continuous Blood Gluc Receiver (FREESTYLE LIBRE 14 DAY READER) DEVI Use as instructed. Check blood glucose level by fingerstick 5 times per day. E11.65 Z79.4 10/19/20   Gildardo Pounds, NP  Continuous Blood Gluc Receiver (FREESTYLE LIBRE 2 READER) DEVI Use as instructed. Check blood glucose level by fingerstick 5 times per day. E11.65 Z79.4 10/25/20   Gildardo Pounds, NP  Continuous Blood Gluc Sensor (FREESTYLE LIBRE 14 DAY SENSOR) MISC Use as instructed. Check blood glucose level by fingerstick 5 times per day. E11.65 Z79.4 10/19/20   Gildardo Pounds, NP  Continuous Blood Gluc Sensor (FREESTYLE LIBRE 2 SENSOR) MISC Use as instructed. Check blood glucose level by fingerstick 5  times per day. E11.65 Z79.4 10/25/20   Gildardo Pounds, NP  insulin aspart (NOVOLOG) 100 UNIT/ML FlexPen Inject 2-10 Units into the skin 3 (three) times daily with meals. Glucose 151-200 take 2 units, glucose 201-250 take 4 units, glucose 251-300 take 6 units, glucose 301-350 take 8 units, glucose 351-400 take 10 units 10/10/20 01/08/21  [place] Eric J, DO  insulin glargine (LANTUS SOLOSTAR) 100 UNIT/ML Solostar Pen Inject 40 Units into the skin 2 (two) times daily. 11/18/20 02/01/21  Charlott Rakes, MD  Insulin Pen Needle (TRUEPLUS 5-BEVEL PEN NEEDLES) 32G X 4 MM MISC Use as instructed to inject insulin. 11/27/19   Charlott Rakes, MD  methocarbamol (ROBAXIN) 500 MG tablet Take 1 tablet (500 mg total) by mouth 4 (four) times daily. 10/19/20   Gildardo Pounds, NP  SUBOXONE 8-2 MG FILM SMARTSIG:1 Strip(s) By Mouth 3 Times Daily 07/14/20   [provider]    Physical Exam: Vitals:   01/19/21 2200 01/19/21 2215 01/19/21 2245 01/19/21 2250  BP: (!) 165/94 (!) 151/88 (!) 150/90 (!) 150/90  Pulse: (!) 106 (!) 107 (!) 106 (!) 106  Resp: $Remo'18 13 16 14  'xtjPp$ Temp: 97.8 F (36.6 C)   97.8 F (36.6  C)  TempSrc: Axillary   Axillary  SpO2:  100% 100%    Physical Exam Constitutional:      General: He is not in acute distress.    Appearance: Normal appearance.  HENT:     Head: Normocephalic and atraumatic.     Mouth/Throat:     Mouth: Mucous membranes are moist.     Pharynx: Oropharynx is clear.  Eyes:     Extraocular Movements: Extraocular movements intact.     Pupils: Pupils are equal, round, and reactive to light.  Cardiovascular:     Rate and Rhythm: Regular rhythm. Tachycardia present.     Pulses: Normal pulses.     Heart sounds: Murmur heard.  Pulmonary:     Effort: Pulmonary effort is normal. No respiratory distress.     Breath sounds: Normal breath sounds.  Abdominal:     General: Bowel sounds are normal. There is no distension.     Palpations: Abdomen is soft.     Tenderness: There is no abdominal tenderness.  Musculoskeletal:        General: No swelling or deformity.  Skin:    General: Skin is warm and dry.  Neurological:     General: No focal deficit present.     Comments: Patient refusing to speak to me.  Briefly conversed with the EDP, has otherwise been not conversive in the ED.   Labs on Admission: I have personally reviewed following labs and imaging studies  CBC: Recent Labs  Lab 01/19/21 2100  WBC 17.7*  HGB 13.5  HCT 40.5  MCV 83.7  PLT 213    Basic Metabolic Panel: Recent Labs  Lab 01/19/21 2100  NA 140  K 3.5  CL 106  CO2 24  GLUCOSE 33*  BUN 22*  CREATININE 1.26*  CALCIUM 9.2    GFR: CrCl cannot be calculated (Unknown ideal weight.).  Liver Function Tests: No results for input(s): AST, ALT, ALKPHOS, BILITOT, PROT, ALBUMIN in the last 168 hours.  Urine analysis:    Component Value Date/Time   COLORURINE YELLOW 01/19/2021 1726   APPEARANCEUR CLEAR 01/19/2021 1726   APPEARANCEUR Clear 04/11/2014 0138   LABSPEC 1.021 01/19/2021 1726   LABSPEC 1.026 04/11/2014 0138   PHURINE 5.0 01/19/2021 1726   GLUCOSEU 50 (A)  01/19/2021 1726   GLUCOSEU >=500 04/11/2014 0138   HGBUR SMALL (A) 01/19/2021 1726   BILIRUBINUR NEGATIVE 01/19/2021 1726   BILIRUBINUR negative 03/24/2018 1032   BILIRUBINUR neg 02/18/2017 0946   BILIRUBINUR Negative 04/11/2014 0138   KETONESUR 5 (A) 01/19/2021 1726   PROTEINUR 100 (A) 01/19/2021 1726   UROBILINOGEN 0.2 03/24/2018 1032   UROBILINOGEN 0.2 12/10/2013 1221   NITRITE NEGATIVE 01/19/2021 1726   LEUKOCYTESUR NEGATIVE 01/19/2021 1726   LEUKOCYTESUR Negative 04/11/2014 0138    Radiological Exams on Admission: No results found.  EKG: Not yet performed   Assessment/Plan Principal Problem:   Hypoglycemia Active Problems:   Polysubstance abuse ([name])   Essential hypertension   CKD (chronic kidney disease) stage 3, GFR 30-59 ml/min ([name])   Leukocytosis   Type 1 diabetes mellitus without complication ([name])   Encephalopathy  Encephalopathy Hypoglycemia Polysubstance use > Patient presented via EMS initially with concerns for hypoglycemia causing altered mental status.  However he also has become agitated and there are concerns after discussion with his wife of polysubstance use also affecting his mentation.  Wife concerned about methamphetamine and heroin. > UDS positive for cocaine and opiates. > He has been involuntarily committed. > Hypoglycemia treated with glucagon and D50 in the ED currently has been stable we will continue to monitor.  May have taken to larger dose of his insulin at home. > Due to involuntary commitment psych has been consulted by EDP. > Of note during a recent admission he had severe agitation with withdrawal from multiple substances and ended up being intubated and placed on Precedex. > Lab work-up in the ED benign other than leukocytosis 17.7. - Monitor closely on telemetry.  Needs of escalating care as he may withdrawal from substance use. - Continue to monitor CBGs every 2 hours for now - Work-up leukocytosis as below - Follow-up  recommendations from TTS/psych  Leukocytosis > Unclear if there may be an infection contributing to his mentation versus a reactive leukocytosis from his agitation. > No fever. - Follow-up urinalysis - Check blood cultures - Chest x-ray  Diabetes > Per chart review is on 40 units twice daily at home. - Every 2 hours CBGs now  - Hold off on insulin administration for now while he is having intermittent hypoglycemic episodes. - We will plan to start SSI if persistently hyperglycemic and then resume his home dose likely tomorrow.  CKD 3 > Creatinine stable at 1.26 in the ED. - Avoid nephrotoxic agents - Trend renal function and electrolytes  Hypertension GERD Hypothyroidism Anxiety > History history of these conditions listed in chart, however not currently on any medications for them  Chronic hepatitis C - Outpatient follow-up  DVT prophylaxis: Lovenox  Code Status:   Full Family Communication:  I attempted to further update wife by phone however there is no answer. Disposition Plan:   Patient is from:  Home  Anticipated DC to:  Pending clinical course  Anticipated DC date:  1 to 3 days  Anticipated DC barriers: Current IVC, may need placement with psych if mentation improving Consults called:  TTS, consulted by EDP.  Admission status:  Observation, telemetry   Severity of Illness: The appropriate patient status for this patient is OBSERVATION. Observation status is judged to be reasonable and necessary in order to provide the required intensity of service to ensure the patient's safety. The patient's presenting symptoms, physical exam findings, and initial radiographic and laboratory data in the context of their medical condition is felt to place  them at decreased risk for further clinical deterioration. Furthermore, it is anticipated that the patient will be medically stable for discharge from the hospital within 2 midnights of admission.     Marcelyn Bruins MD [facility]  How to contact the [facility] Attending or Consulting provider [place] or covering provider during after hours [place], for this patient?   Check the care team in [facility] and look for a) attending/consulting [facility] provider listed and b) the [facility] team listed Log into [email or web address] and use [place]'s universal password to access. If you do not have the password, please contact the hospital operator. Locate the [facility] provider you are looking for under [facility] and page to a number that you can be directly reached. If you still have difficulty reaching the provider, please page the [facility] (Director on Call) for the Hospitalists listed on amion for assistance.  01/19/2021, 11:19 PM

## 2021-01-19 NOTE — ED Notes (Signed)
IVC paperwork done handed to [employer]

## 2021-01-19 NOTE — ED Notes (Signed)
Pt belongings placed in locker #7.

## 2021-01-19 NOTE — ED Triage Notes (Signed)
BIB [facility] from home. Initial called out for hypoglycemia. CBG 38. Glucagon given. CBG up to 102. Pt then started to become aggressive with EMS. Started hitting and trying to bite. Crack pipe found in pocket. Hx. DM and drug use. Pt received 5mg  Halodol IM. Pt in four point violent restraints upon arrival. Pt awake but not responding to staff.   170/76  94 heart rate 98% room air.

## 2021-01-19 NOTE — ED Notes (Signed)
Pt restraints removed at this time. Pt is resting and changed into gown. All belongings removed from room and being inventoried.

## 2021-01-20 ENCOUNTER — Encounter (HOSPITAL_COMMUNITY): Payer: Self-pay | Admitting: Internal Medicine

## 2021-01-20 ENCOUNTER — Observation Stay (HOSPITAL_COMMUNITY): Payer: Medicaid Other

## 2021-01-20 DIAGNOSIS — E162 Hypoglycemia, unspecified: Secondary | ICD-10-CM | POA: Diagnosis not present

## 2021-01-20 DIAGNOSIS — N1831 Chronic kidney disease, stage 3a: Secondary | ICD-10-CM | POA: Diagnosis not present

## 2021-01-20 DIAGNOSIS — D72829 Elevated white blood cell count, unspecified: Secondary | ICD-10-CM | POA: Diagnosis not present

## 2021-01-20 DIAGNOSIS — I10 Essential (primary) hypertension: Secondary | ICD-10-CM | POA: Diagnosis not present

## 2021-01-20 LAB — COMPREHENSIVE METABOLIC PANEL
ALT: 20 U/L (ref 0–44)
AST: 23 U/L (ref 15–41)
Albumin: 3.4 g/dL — ABNORMAL LOW (ref 3.5–5.0)
Alkaline Phosphatase: 76 U/L (ref 38–126)
Anion gap: 9 (ref 5–15)
BUN: 18 mg/dL (ref 6–20)
CO2: 23 mmol/L (ref 22–32)
Calcium: 9 mg/dL (ref 8.9–10.3)
Chloride: 105 mmol/L (ref 98–111)
Creatinine, Ser: 1.09 mg/dL (ref 0.61–1.24)
GFR, Estimated: >60 mL/min (ref >60)
Glucose, Bld: 73 mg/dL (ref 70–99)
Potassium: 4 mmol/L (ref 3.5–5.1)
Sodium: 137 mmol/L (ref 135–145)
Total Bilirubin: 0.4 mg/dL (ref 0.3–1.2)
Total Protein: 6 g/dL — ABNORMAL LOW (ref 6.5–8.1)

## 2021-01-20 LAB — GLUCOSE, CAPILLARY: Glucose-Capillary: 273 mg/dL — ABNORMAL HIGH (ref 70–99)

## 2021-01-20 LAB — CBC
HCT: 38.3 % — ABNORMAL LOW (ref 39.0–52.0)
Hemoglobin: 12.6 g/dL — ABNORMAL LOW (ref 13.0–17.0)
MCH: 28.1 pg (ref 26.0–34.0)
MCHC: 32.9 g/dL (ref 30.0–36.0)
MCV: 85.3 fL (ref 80.0–100.0)
Platelets: 314 10*3/uL (ref 150–400)
RBC: 4.49 MIL/uL (ref 4.22–5.81)
RDW: 13.4 % (ref 11.5–15.5)
WBC: 15.1 10*3/uL — ABNORMAL HIGH (ref 4.0–10.5)
nRBC: 0 % (ref 0.0–0.2)

## 2021-01-20 LAB — RESP PANEL BY RT-PCR (FLU A&B, COVID) ARPGX2
Influenza A by PCR: NEGATIVE
Influenza B by PCR: NEGATIVE
SARS Coronavirus 2 by RT PCR: NEGATIVE

## 2021-01-20 LAB — CBG MONITORING, ED
Glucose-Capillary: 114 mg/dL — ABNORMAL HIGH (ref 70–99)
Glucose-Capillary: 157 mg/dL — ABNORMAL HIGH (ref 70–99)
Glucose-Capillary: 187 mg/dL — ABNORMAL HIGH (ref 70–99)
Glucose-Capillary: 351 mg/dL — ABNORMAL HIGH (ref 70–99)
Glucose-Capillary: 49 mg/dL — ABNORMAL LOW (ref 70–99)
Glucose-Capillary: 63 mg/dL — ABNORMAL LOW (ref 70–99)
Glucose-Capillary: 81 mg/dL (ref 70–99)
Glucose-Capillary: 83 mg/dL (ref 70–99)

## 2021-01-20 MED ORDER — LORAZEPAM 2 MG/ML IJ SOLN
1.0000 mg | Freq: Four times a day (QID) | INTRAMUSCULAR | Status: DC | PRN
  Administered 2021-01-20 – 2021-01-21 (×2): 1 mg via INTRAVENOUS
  Filled 2021-01-20 (×2): qty 1

## 2021-01-20 MED ORDER — INSULIN GLARGINE-YFGN 100 UNIT/ML ~~LOC~~ SOLN
40.0000 [IU] | Freq: Every day | SUBCUTANEOUS | Status: DC
  Administered 2021-01-20: 40 [IU] via SUBCUTANEOUS
  Filled 2021-01-20 (×4): qty 0.4

## 2021-01-20 MED ORDER — AMLODIPINE BESYLATE 5 MG PO TABS
5.0000 mg | ORAL_TABLET | Freq: Every day | ORAL | Status: DC
  Administered 2021-01-20 – 2021-01-21 (×2): 5 mg via ORAL
  Filled 2021-01-20 (×2): qty 1

## 2021-01-20 MED ORDER — INSULIN ASPART 100 UNIT/ML IJ SOLN
0.0000 [IU] | Freq: Three times a day (TID) | INTRAMUSCULAR | Status: DC
  Administered 2021-01-21: 3 [IU] via SUBCUTANEOUS
  Administered 2021-01-21: 7 [IU] via SUBCUTANEOUS

## 2021-01-20 MED ORDER — DEXTROSE 50 % IV SOLN
INTRAVENOUS | Status: AC
  Filled 2021-01-20: qty 50

## 2021-01-20 MED ORDER — DEXTROSE 50 % IV SOLN: 1.0000 | Freq: Once | INTRAVENOUS | Status: AC

## 2021-01-20 NOTE — ED Notes (Signed)
Tele tracking in

## 2021-01-20 NOTE — ED Notes (Signed)
Pt refused chest xray. MD made aware.

## 2021-01-20 NOTE — ED Notes (Signed)
Pt is alert at this time and talking with RN. Pt states he doesn't remember what happened but A&Ox3. Pt requesting something to drink and eat. Lunch bag provided. Pt is calm and cooperative at this time.

## 2021-01-20 NOTE — ED Notes (Signed)
Pt is anxious and pacing with tremors complaning of back pain.  Admitting notified.  1mg  Ativan ordered q6 has been administered to try and help pt relax.

## 2021-01-20 NOTE — ED Notes (Signed)
Pt calm and cooperative at this time. Requesting more food and water. Pt provided with another sandwich bag and water.
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
## 2021-01-21 DIAGNOSIS — F1121 Opioid dependence, in remission: Secondary | ICD-10-CM

## 2021-01-21 DIAGNOSIS — I10 Essential (primary) hypertension: Secondary | ICD-10-CM | POA: Diagnosis not present

## 2021-01-21 DIAGNOSIS — D72829 Elevated white blood cell count, unspecified: Secondary | ICD-10-CM | POA: Diagnosis not present

## 2021-01-21 DIAGNOSIS — G9340 Encephalopathy, unspecified: Secondary | ICD-10-CM | POA: Diagnosis not present

## 2021-01-21 DIAGNOSIS — F1994 Other psychoactive substance use, unspecified with psychoactive substance-induced mood disorder: Secondary | ICD-10-CM

## 2021-01-21 LAB — HEMOGLOBIN A1C
Hgb A1c MFr Bld: 8.3 % — ABNORMAL HIGH (ref 4.8–5.6)
Mean Plasma Glucose: 191.51 mg/dL

## 2021-01-21 LAB — GLUCOSE, CAPILLARY
Glucose-Capillary: 190 mg/dL — ABNORMAL HIGH (ref 70–99)
Glucose-Capillary: 301 mg/dL — ABNORMAL HIGH (ref 70–99)

## 2021-01-21 MED ORDER — AMLODIPINE BESYLATE 5 MG PO TABS: 5.0000 mg | ORAL_TABLET | Freq: Every day | ORAL | 2 refills | Status: DC

## 2021-01-21 MED ORDER — LANTUS SOLOSTAR 100 UNIT/ML ~~LOC~~ SOPN
30.0000 [IU] | PEN_INJECTOR | Freq: Two times a day (BID) | SUBCUTANEOUS | 1 refills | Status: DC
  Filled 2021-03-29: qty 18, 30d supply, fill #0

## 2021-01-21 NOTE — Consult Note (Signed)
[facility] Consult   Reason for Consult:  '' Suicidal ideation, polysubstance abuse'' Referring Physician:  Eleonore Chiquito, MD Patient Identification: Luke [name] MRN:  [id number] Principal Diagnosis: Substance induced mood disorder ([place]) Diagnosis:  Principal Problem:   Substance induced mood disorder ([place]) Active Problems:   Polysubstance abuse ([place])   Essential hypertension   CKD (chronic kidney disease) stage 3, GFR 30-59 ml/min ([name])   Leukocytosis   Type 1 diabetes mellitus without complication ([name])   Hypoglycemia   Encephalopathy   Opioid use disorder, moderate, in early remission, on maintenance therapy, dependence ([place])   Total Time spent with patient: 1 hour  Subjective:   Luke [name] is a 40 y.o. male with history significant of cellulitis, bacterial endocarditis, polysubstance use(Cocaine but Opiate his drug of choice), anemia, CKD 3, type 1 diabetes, hypertension, GERD, hepatitis C, hypothyroidism, who was brought to th hospital due to altered mental status which patient believes is secondary to hypoglycemia(blood sugar at ED was 38). However, patient reports that he was attending a Suboxone program due to Opioid addiction but recently relapsed.He has no recollection of out he was brought to the hospital as well as his belligerent behavior at the ED which may be attributed to altered mental status sue to hypoglycemia and or substance intoxication(positive for Cocaine and opiate on admission to the ED). Today, patient is alert, awake , oriented x 4, cooperative, pleasant, denies psychosis, delusions and self harming thoughts. He denies previous history of suicide attempt and says:''I will never take my life, I love myself, my family and my 6 children.''  Past Psychiatric History: as above  Risk to Self:  denies Risk to Others:  denies Prior Inpatient Therapy:  none reported Prior Outpatient Therapy:  Attending Suboxone program  Past Medical History:  Past  Medical History:  Diagnosis Date   Anemia    Anxiety  Dx 2008   Cellulitis    Chronic kidney disease    Diabetes mellitus without complication ([place]) Dx 1660   DKA (diabetic ketoacidoses) 07/21/2017   Endocarditis 04/2019   Endocarditis    GERD (gastroesophageal reflux disease) Dx 2008   Headache(784.0)    Heart murmur    Hepatitis C    Hypertension    Hypothyroidism    MRSA infection    Necrotizing fasciitis of multiple sites ([place])    Pneumonia    Seizures ([place]) 2011   x 2 in lifetime. on Dilantin for a while.    Sepsis ([place])    Substance abuse ([place]) 2013   heroin use, multiple relapses   Type 1 diabetes [facility])     Past Surgical History:  Procedure Laterality Date   AMPUTATION Left 09/05/2018   Procedure: LEFT GREAT TOE AMPUTATION;  Surgeon: [name], MD;  Location: [place];  Service: Orthopedics;  Laterality: Left;   APPLICATION OF A-CELL OF EXTREMITY Bilateral 09/01/2019   Procedure: APPLICATION OF MESHED PRIMATRIX AG OF EXTREMITY;  Surgeon: Cindra Presume, MD;  Location: [place];  Service: Plastics;  Laterality: Bilateral;   I & D EXTREMITY Left 10/11/2012   Procedure: IRRIGATION AND DEBRIDEMENT ABSCESS FOREARM;  Surgeon: Linna Hoff, MD;  Location: [place];  Service: Orthopedics;  Laterality: Left;   I & D EXTREMITY Left 10/12/2012   Procedure: IRRIGATION AND DEBRIDEMENT FOREARM;  Surgeon: Linna Hoff, MD;  Location: [place];  Service: Orthopedics;  Laterality: Left;   I & D EXTREMITY Left 10/14/2012   Procedure: incision and drainage left forearm;  Surgeon: Linna Hoff, MD;  Location: [place];  Service: Orthopedics;  Laterality: Left;   I & D EXTREMITY Left 10/16/2012   Procedure: IRRIGATION AND DEBRIDEMENT LEFT FOREARM;  Surgeon: Linna Hoff, MD;  Location: [place];  Service: Orthopedics;  Laterality: Left;   I & D EXTREMITY Left 10/20/2012   Procedure: INCISION AND DRAINAGE AND DEBRIDEMENT LEFT  FOREARM;  Surgeon: Linna Hoff, MD;  Location: [place];  Service:  Orthopedics;  Laterality: Left;   I & D EXTREMITY Right 05/22/2019   Procedure: IRRIGATION AND DEBRIDEMENT RIGHT ARM;  Surgeon: [name], MD;  Location: [place];  Service: Orthopedics;  Laterality: Right;   INCISION AND DRAINAGE OF WOUND Bilateral 11/29/2017   Procedure: DEBRIDEMENT BILATERAL FEET, DEBRIDEMENT LEFT ANKLE, AND APPLY WOUND VAC;  Surgeon: [name], MD;  Location: [place];  Service: Orthopedics;  Laterality: Bilateral;   INCISION AND DRAINAGE OF WOUND Bilateral 09/01/2019   Procedure: IRRIGATION AND DEBRIDEMENT FOREARM WOUNDS;  Surgeon: Cindra Presume, MD;  Location: [place];  Service: Plastics;  Laterality: Bilateral;   IRRIGATION AND DEBRIDEMENT ABSCESS     Hx: of left arm abscess related to drug use    TEE WITHOUT CARDIOVERSION N/A 11/28/2017   Procedure: TRANSESOPHAGEAL ECHOCARDIOGRAM (TEE);  Surgeon: Sanda Klein, MD;  Location: [place];  Service: Cardiovascular;  Laterality: N/A;   TEE WITHOUT CARDIOVERSION N/A 05/12/2019   Procedure: TRANSESOPHAGEAL ECHOCARDIOGRAM (TEE);  Surgeon: Skeet Latch, MD;  Location: [facility];  Service: Cardiovascular;  Laterality: N/A;   Family History:  Family History  Problem Relation Age of Onset   Diabetes Father    Heart disease Father    Mental illness Sister    Cancer Neg Hx    Family Psychiatric  History:   Social History:  Social History   Substance and Sexual Activity  Alcohol Use No   Alcohol/week: 0.0 standard drinks   Comment: rarely     Social History   Substance and Sexual Activity  Drug Use Not Currently   Types: Cocaine, IV, Heroin   Comment: IVDU since Dad died in 04-Jun-2006 with 15 month period of sobriety 2018-2019, relapse Aug 2019.    Social History   Socioeconomic History   Marital status: Married    Spouse name: Not on file   Number of children: 6   Years of education: some colle   Highest education level: Not on file  Occupational History   Occupation: Event set up    Employer:  [employer]    Occupation: Stencil for cars   Tobacco Use   Smoking status: Every Day    Packs/day: 1.00    Years: 25.00    Pack years: 25.00    Types: Cigarettes   Smokeless tobacco: Never   Tobacco comments:    decreased over the last few weeks because he has not been breathing well  Vaping Use   Vaping Use: Some days  Substance and Sexual Activity   Alcohol use: No    Alcohol/week: 0.0 standard drinks    Comment: rarely   Drug use: Not Currently    Types: Cocaine, IV, Heroin    Comment: IVDU since Dad died in 2006-06-04 with 15 month period of sobriety 2018-2019, relapse Aug 2019.   Sexual activity: Yes    Partners: Female    Birth control/protection: None  Other Topics Concern   Not on file  Social History Narrative   Lives with wife and two  children age 67 and 54.          Social Determinants of Health   Financial Resource Strain: Not on file  Food Insecurity: Not on file  Transportation Needs: Not on file  Physical Activity: Not on file  Stress: Not on file  Social Connections: Not on file   Additional Social History:    Allergies:  No Known Allergies  Labs:  Results for orders placed or performed during the hospital encounter of 01/19/21 (from the past 48 hour(s))  CBG monitoring, ED     Status: Abnormal   Collection Time: 01/19/21  5:16 PM  Result Value Ref Range   Glucose-Capillary 323 (H) 70 - 99 mg/dL    Comment: Glucose reference range applies only to samples taken after fasting for at least 8 hours.   Comment 1 Notify RN    Comment 2 Document in Chart   Rapid urine drug screen (hospital performed)     Status: Abnormal   Collection Time: 01/19/21  5:22 PM  Result Value Ref Range   Opiates POSITIVE (A) NONE DETECTED   Cocaine POSITIVE (A) NONE DETECTED   Benzodiazepines NONE DETECTED NONE DETECTED   Amphetamines NONE DETECTED NONE DETECTED   Tetrahydrocannabinol NONE DETECTED NONE DETECTED   Barbiturates NONE DETECTED NONE  DETECTED    Comment: (NOTE) DRUG SCREEN FOR MEDICAL PURPOSES ONLY.  IF CONFIRMATION IS NEEDED FOR ANY PURPOSE, NOTIFY LAB WITHIN 5 DAYS.  LOWEST DETECTABLE LIMITS FOR URINE DRUG SCREEN Drug Class                     Cutoff (ng/mL) Amphetamine and metabolites    1000 Barbiturate and metabolites    200 Benzodiazepine                 017 Tricyclics and metabolites     300 Opiates and metabolites        300 Cocaine and metabolites        300 THC                            50 Performed at [facility], [place] [address], [place], [place] [address]   Urinalysis, Routine w reflex microscopic     Status: Abnormal   Collection Time: 01/19/21  5:26 PM  Result Value Ref Range   Color, Urine YELLOW YELLOW   APPearance CLEAR CLEAR   Specific Gravity, Urine 1.021 1.005 - 1.030   pH 5.0 5.0 - 8.0   Glucose, UA 50 (A) NEGATIVE mg/dL   Hgb urine dipstick SMALL (A) NEGATIVE   Bilirubin Urine NEGATIVE NEGATIVE   Ketones, ur 5 (A) NEGATIVE mg/dL   Protein, ur 100 (A) NEGATIVE mg/dL   Nitrite NEGATIVE NEGATIVE   Leukocytes,Ua NEGATIVE NEGATIVE   RBC / HPF 0-5 0 - 5 RBC/hpf   WBC, UA 0-5 0 - 5 WBC/hpf   Bacteria, UA NONE SEEN NONE SEEN   Mucus PRESENT     Comment: Performed at [facility] [address], [place], [place] [address]  CBG monitoring, ED     Status: Abnormal   Collection Time: 01/19/21  8:44 PM  Result Value Ref Range   Glucose-Capillary 30 (LL) 70 - 99 mg/dL    Comment: Glucose reference range applies only to samples taken after fasting for at least 8 hours.   Comment 1 Call MD NNP PA CNM   CBC  Status: Abnormal   Collection Time: 01/19/21  9:00 PM  Result Value Ref Range   WBC 17.7 (H) 4.0 - 10.5 K/uL   RBC 4.84 4.22 - 5.81 MIL/uL   Hemoglobin 13.5 13.0 - 17.0 g/dL   HCT 40.5 39.0 - 52.0 %   MCV 83.7 80.0 - 100.0 fL   MCH 27.9 26.0 - 34.0 pg   MCHC 33.3 30.0 - 36.0 g/dL   RDW 13.6 11.5 - 15.5 %   Platelets 370 150 - 400 K/uL   nRBC 0.0 0.0 - 0.2 %     Comment: Performed at [facility], [place] [address], [place], [place] [address]  Basic metabolic panel     Status: Abnormal   Collection Time: 01/19/21  9:00 PM  Result Value Ref Range   Sodium 140 135 - 145 mmol/L   Potassium 3.5 3.5 - 5.1 mmol/L   Chloride 106 98 - 111 mmol/L   CO2 24 22 - 32 mmol/L   Glucose, Bld 33 (LL) 70 - 99 mg/dL    Comment: Glucose reference range applies only to samples taken after fasting for at least 8 hours. CRITICAL RESULT CALLED TO, READ BACK BY AND VERIFIED WITH: DAVIDSON A,RN 01/19/21 2140 WAYK    BUN 22 (H) 6 - 20 mg/dL   Creatinine, Ser 1.26 (H) 0.61 - 1.24 mg/dL   Calcium 9.2 8.9 - 10.3 mg/dL   GFR, Estimated >60 >60 mL/min    Comment: (NOTE) Calculated using the CKD-EPI Creatinine Equation (2021)    Anion gap 10 5 - 15    Comment: Performed at [facility] [address] [place], [place] [address]  Acetaminophen level     Status: Abnormal   Collection Time: 01/19/21  9:00 PM  Result Value Ref Range   Acetaminophen (Tylenol), Serum <10 (L) 10 - 30 ug/mL    Comment: (NOTE) Therapeutic concentrations vary significantly. A range of 10-30 ug/mL  may be an effective concentration for many patients. However, some  are best treated at concentrations outside of this range. Acetaminophen concentrations >150 ug/mL at 4 hours after ingestion  and >50 ug/mL at 12 hours after ingestion are often associated with  toxic reactions.  Performed at [facility], [place] [address], [name], [place] [address]   Salicylate level     Status: Abnormal   Collection Time: 01/19/21  9:00 PM  Result Value Ref Range   Salicylate Lvl <2.0 (L) 7.0 - 30.0 mg/dL    Comment: Performed at [facility] [address], [place], [place] [address]  Ethanol     Status: None   Collection Time: 01/19/21  9:00 PM  Result Value Ref Range   Alcohol, Ethyl (B) <10 <10 mg/dL    Comment: (NOTE) Lowest detectable limit for serum alcohol is 10 mg/dL.  For  medical purposes only. Performed at [facility], [place] [address], [place], [place] [address]   CBG monitoring, ED     Status: Abnormal   Collection Time: 01/19/21  9:21 PM  Result Value Ref Range   Glucose-Capillary 209 (H) 70 - 99 mg/dL    Comment: Glucose reference range applies only to samples taken after fasting for at least 8 hours.  CBG monitoring, ED     Status: Abnormal   Collection Time: 01/19/21 10:54 PM  Result Value Ref Range   Glucose-Capillary 128 (H) 70 - 99 mg/dL    Comment: Glucose reference range applies only to samples taken  after fasting for at least 8 hours.  CBG monitoring, ED     Status: Abnormal   Collection Time: 01/20/21 12:26 AM  Result Value Ref Range   Glucose-Capillary 63 (L) 70 - 99 mg/dL    Comment: Glucose reference range applies only to samples taken after fasting for at least 8 hours.  CBG monitoring, ED     Status: Abnormal   Collection Time: 01/20/21  2:40 AM  Result Value Ref Range   Glucose-Capillary 114 (H) 70 - 99 mg/dL    Comment: Glucose reference range applies only to samples taken after fasting for at least 8 hours.  CBG monitoring, ED     Status: Abnormal   Collection Time: 01/20/21  6:13 AM  Result Value Ref Range   Glucose-Capillary 49 (L) 70 - 99 mg/dL    Comment: Glucose reference range applies only to samples taken after fasting for at least 8 hours.  CBG monitoring, ED     Status: None   Collection Time: 01/20/21  6:36 AM  Result Value Ref Range   Glucose-Capillary 83 70 - 99 mg/dL    Comment: Glucose reference range applies only to samples taken after fasting for at least 8 hours.  Culture, blood (routine x 2)     Status: None (Preliminary result)   Collection Time: 01/20/21  7:37 AM   Specimen: BLOOD RIGHT HAND  Result Value Ref Range   Specimen Description BLOOD RIGHT HAND    Special Requests      BOTTLES DRAWN AEROBIC AND ANAEROBIC Blood Culture adequate volume   Culture      NO GROWTH 1 DAY Performed at [facility], [place] [address], [place], [place] [address]    Report Status PENDING   CBG monitoring, ED     Status: None   Collection Time: 01/20/21  7:41 AM  Result Value Ref Range   Glucose-Capillary 81 70 - 99 mg/dL    Comment: Glucose reference range applies only to samples taken after fasting for at least 8 hours.  CBC     Status: Abnormal   Collection Time: 01/20/21  7:49 AM  Result Value Ref Range   WBC 15.1 (H) 4.0 - 10.5 K/uL   RBC 4.49 4.22 - 5.81 MIL/uL   Hemoglobin 12.6 (L) 13.0 - 17.0 g/dL   HCT 38.3 (L) 39.0 - 52.0 %   MCV 85.3 80.0 - 100.0 fL   MCH 28.1 26.0 - 34.0 pg   MCHC 32.9 30.0 - 36.0 g/dL   RDW 13.4 11.5 - 15.5 %   Platelets 314 150 - 400 K/uL   nRBC 0.0 0.0 - 0.2 %    Comment: Performed at [facility], [place] [address], [place], [place] [address]  Comprehensive metabolic panel     Status: Abnormal   Collection Time: 01/20/21  7:49 AM  Result Value Ref Range   Sodium 137 135 - 145 mmol/L   Potassium 4.0 3.5 - 5.1 mmol/L   Chloride 105 98 - 111 mmol/L   CO2 23 22 - 32 mmol/L   Glucose, Bld 73 70 - 99 mg/dL    Comment: Glucose reference range applies only to samples taken after fasting for at least 8 hours.   BUN 18 6 - 20 mg/dL   Creatinine, Ser 1.09 0.61 - 1.24 mg/dL   Calcium 9.0 8.9 - 10.3 mg/dL   Total Protein 6.0 (L) 6.5 - 8.1 g/dL   Albumin 3.4 (L) 3.5 - 5.0 g/dL  AST 23 15 - 41 U/L   ALT 20 0 - 44 U/L   Alkaline Phosphatase 76 38 - 126 U/L   Total Bilirubin 0.4 0.3 - 1.2 mg/dL   GFR, Estimated >60 >60 mL/min    Comment: (NOTE) Calculated using the CKD-EPI Creatinine Equation (2021)    Anion gap 9 5 - 15    Comment: Performed at [facility] [address], [place], [place] [address]  Culture, blood (routine x 2)     Status: None (Preliminary result)   Collection Time: 01/20/21  7:50 AM   Specimen: BLOOD RIGHT HAND  Result Value Ref Range   Specimen Description BLOOD RIGHT HAND    Special Requests      BOTTLES DRAWN AEROBIC  AND ANAEROBIC Blood Culture adequate volume   Culture      NO GROWTH 1 DAY Performed at [facility], [place] [address], [place], [place] [address]    Report Status PENDING   Resp Panel by RT-PCR (Flu A&B, Covid) Nasopharyngeal Swab     Status: None   Collection Time: 01/20/21  9:12 AM   Specimen: Nasopharyngeal Swab; Nasopharyngeal(NP) swabs in vial transport medium  Result Value Ref Range   SARS Coronavirus 2 by RT PCR NEGATIVE NEGATIVE    Comment: (NOTE) SARS-CoV-2 target nucleic acids are NOT DETECTED.  The SARS-CoV-2 RNA is generally detectable in upper respiratory specimens during the acute phase of infection. The lowest concentration of SARS-CoV-2 viral copies this assay can detect is 138 copies/mL. A negative result does not preclude SARS-Cov-2 infection and should not be used as the sole basis for treatment or other patient management decisions. A negative result may occur with  improper specimen collection/handling, submission of specimen other than nasopharyngeal swab, presence of viral mutation(s) within the areas targeted by this assay, and inadequate number of viral copies(<138 copies/mL). A negative result must be combined with clinical observations, patient history, and epidemiological information. The expected result is Negative.  Fact Sheet for Patients:  EntrepreneurPulse.com.au  Fact Sheet for Healthcare Providers:  IncredibleEmployment.be  This test is no t yet approved or cleared by the [place] FDA and  has been authorized for detection and/or diagnosis of SARS-CoV-2 by FDA under an Emergency Use Authorization (EUA). This EUA will remain  in effect (meaning this test can be used) for the duration of the COVID-19 declaration under Section 564(b)(1) of the Act, 21 U.S.C.section 360bbb-3(b)(1), unless the authorization is terminated  or revoked sooner.       Influenza A by PCR NEGATIVE NEGATIVE   Influenza B by  PCR NEGATIVE NEGATIVE    Comment: (NOTE) The Xpert Xpress SARS-CoV-2/FLU/RSV plus assay is intended as an aid in the diagnosis of influenza from Nasopharyngeal swab specimens and should not be used as a sole basis for treatment. Nasal washings and aspirates are unacceptable for Xpert Xpress SARS-CoV-2/FLU/RSV testing.  Fact Sheet for Patients: EntrepreneurPulse.com.au  Fact Sheet for Healthcare Providers: IncredibleEmployment.be  This test is not yet approved or cleared by the [place] FDA and has been authorized for detection and/or diagnosis of SARS-CoV-2 by FDA under an Emergency Use Authorization (EUA). This EUA will remain in effect (meaning this test can be used) for the duration of the COVID-19 declaration under Section 564(b)(1) of the Act, 21 U.S.C. section 360bbb-3(b)(1), unless the authorization is terminated or revoked.  Performed at [facility], [place] [address], [place],  [address]   CBG monitoring, ED     Status:  Abnormal   Collection Time: 01/20/21  9:54 AM  Result Value Ref Range   Glucose-Capillary 157 (H) 70 - 99 mg/dL    Comment: Glucose reference range applies only to samples taken after fasting for at least 8 hours.  CBG monitoring, ED     Status: Abnormal   Collection Time: 01/20/21 11:45 AM  Result Value Ref Range   Glucose-Capillary 187 (H) 70 - 99 mg/dL    Comment: Glucose reference range applies only to samples taken after fasting for at least 8 hours.  CBG monitoring, ED     Status: Abnormal   Collection Time: 01/20/21  3:29 PM  Result Value Ref Range   Glucose-Capillary 351 (H) 70 - 99 mg/dL    Comment: Glucose reference range applies only to samples taken after fasting for at least 8 hours.  Glucose, capillary     Status: Abnormal   Collection Time: 01/20/21  7:49 PM  Result Value Ref Range   Glucose-Capillary 273 (H) 70 - 99 mg/dL    Comment: Glucose reference range applies only to samples  taken after fasting for at least 8 hours.  Hemoglobin A1c     Status: Abnormal   Collection Time: 01/21/21  2:48 AM  Result Value Ref Range   Hgb A1c MFr Bld 8.3 (H) 4.8 - 5.6 %    Comment: (NOTE) Pre diabetes:          5.7%-6.4%  Diabetes:              >6.4%  Glycemic control for   <7.0% adults with diabetes    Mean Plasma Glucose 191.51 mg/dL    Comment: Performed at [facility] [address] [place], [place] [address]  Glucose, capillary     Status: Abnormal   Collection Time: 01/21/21  7:39 AM  Result Value Ref Range   Glucose-Capillary 190 (H) 70 - 99 mg/dL    Comment: Glucose reference range applies only to samples taken after fasting for at least 8 hours.   Comment 1 Notify RN    Comment 2 Document in Chart   Glucose, capillary     Status: Abnormal   Collection Time: 01/21/21 11:45 AM  Result Value Ref Range   Glucose-Capillary 301 (H) 70 - 99 mg/dL    Comment: Glucose reference range applies only to samples taken after fasting for at least 8 hours.   Comment 1 Notify RN    Comment 2 Document in Chart     Current Facility-Administered Medications  Medication Dose Route Frequency Provider Last Rate Last Admin   acetaminophen (TYLENOL) tablet 650 mg  650 mg Oral Q6H PRN Marcelyn Bruins, MD       Or   acetaminophen (TYLENOL) suppository 650 mg  650 mg Rectal Q6H PRN Marcelyn Bruins, MD       amLODipine (NORVASC) tablet 5 mg  5 mg Oral Daily Oswald Hillock, MD   5 mg at 01/21/21 0957   enoxaparin (LOVENOX) injection 40 mg  40 mg Subcutaneous Q24H Marcelyn Bruins, MD   40 mg at 01/20/21 2223   glucagon (human recombinant) (GLUCAGEN) injection 1 mg  1 mg Intramuscular Once Marcelyn Bruins, MD       insulin aspart (novoLOG) injection 0-9 Units  0-9 Units Subcutaneous TID WC Oswald Hillock, MD   7 Units at 01/21/21 1248   insulin glargine-yfgn (SEMGLEE) injection 40 Units  40 Units Subcutaneous QHS Oswald Hillock, MD   40 Units  at 01/20/21 2225    LORazepam (ATIVAN) injection 1 mg  1 mg Intravenous Q6H PRN Oswald Hillock, MD   1 mg at 01/21/21 1013   polyethylene glycol (MIRALAX / GLYCOLAX) packet 17 g  17 g Oral Daily PRN Marcelyn Bruins, MD       sodium chloride flush (NS) 0.9 % injection 3 mL  3 mL Intravenous Q12H Marcelyn Bruins, MD   3 mL at 01/21/21 0957    Musculoskeletal: Strength & Muscle Tone:  not tested Gait & Station: normal Patient leans: N/A    Psychiatric Specialty Exam:  Presentation  General Appearance: Appropriate for Environment  Eye Contact:Good  Speech:Normal Rate; Clear and Coherent  Speech Volume:Normal  Handedness:Right   Mood and Affect  Mood:Euthymic  Affect:Appropriate   Thought Process  Thought Processes:Coherent; Linear  Descriptions of Associations:Intact  Orientation:Full (Time, Place and Person)  Thought Content:Logical  History of Schizophrenia/Schizoaffective disorder:No data recorded Duration of Psychotic Symptoms:No data recorded Hallucinations:Hallucinations: None  Ideas of Reference:None  Suicidal Thoughts:Suicidal Thoughts: No  Homicidal Thoughts:Homicidal Thoughts: No   Sensorium  Memory:Immediate Good; Recent Good; Remote Good  Judgment:Intact  Insight:Fair   Executive Functions  Concentration:Good  Attention Span:Good  [place] of Knowledge:Good  Language:Good   Psychomotor Activity  Psychomotor Activity:Psychomotor Activity: Normal   Assets  Assets:Communication Skills; Desire for Improvement   Sleep  Sleep:Sleep: Fair   Physical Exam: Physical Exam ROS Blood pressure (!) 152/90, pulse (!) 102, temperature 98.5 F (36.9 C), temperature source Oral, resp. rate (!) 22, height 5\' 9"  (1.753 m), weight 79.2 kg, SpO2 100 %. Body mass index is 25.78 kg/m.  Treatment Plan Summary: 40 year old male with long history of diabetes and Opioid use disorder. He reports poor compliance with his drug program and medication  for diabetes. He was brought to the hospital due to hypoglycemia and altered mental status. Patient is alert, awake and denies self harming thoughts. Patient no longer meet criteria for psychiatric admission.  Recommendations: -Social worker to refer patient back to his Suboxone program after he is medically cleared    Disposition: No evidence of imminent risk to self or others at present.   Patient does not meet criteria for psychiatric inpatient admission. Supportive therapy provided about ongoing stressors. Psychiatric service signing off. Re-consult as needed  Corena Pilgrim, MD 01/21/2021 1:13 PM

## 2021-01-21 NOTE — Discharge Summary (Signed)
Physician Discharge Summary  Luke [name] QMG:[id number] DOB: 1980/07/07 DOA: 01/19/2021  PCP: Gildardo Pounds, NP  Admit date: 01/19/2021 Discharge date: 01/21/2021  Time spent: 60 minutes  Recommendations for Outpatient Follow-up:  Patient was referred to Suboxone program as outpatient   Discharge Diagnoses:  Principal Problem:   Substance induced mood disorder ([place]) Active Problems:   Polysubstance abuse ([place])   Essential hypertension   CKD (chronic kidney disease) stage 3, GFR 30-59 ml/min ([name])   Leukocytosis   Type 1 diabetes mellitus without complication ([name])   Hypoglycemia   Encephalopathy   Opioid use disorder, moderate, in early remission, on maintenance therapy, dependence ([place])   Discharge Condition: Stable  Diet recommendation: Heart healthy diet  Filed Weights   01/20/21 1950 01/21/21 0345  Weight: 79.6 kg 79.2 kg    History of present illness:  40 year old male with medical history of cellulitis, likely endocarditis, polysubstance abuse, anemia, CKD stage III, diabetes mellitus type 1, hypertension, GERD, hepatitis C, hypothyroidism, anxiety presented with altered mental status and hypoglycemia. Patient was acting unusual and lethargic at home and spouse called EMS.  Was found to be hypoglycemic, was given glucagon with some improvement initially.  He  became aggressive with EMS and began hitting and trying to bite she was given 5 mg of Haldol in route and placed in restraints. Patient has been on Suboxone program since recent mission due to need to quit heroin use.  Wife feels that patient has been more impaired at home and may be using heroin again along with methamphetamine. Due to altered mental status and concern about him talking about dying he was involuntarily committed.  Hospital Course:  Encephalopathy/polysubstance abuse/hypoglycemia -Patient presented with encephalopathy; resolved, back to baseline -Found to be hypoglycemic at home, UDS positive  for cocaine and opiates -He has been involuntarily committed due to suicidal ideations -Psych was consulted, -Blood glucose has improved   Diabetes mellitus type 1/hypoglycemia -Patient has been on Lantus 40 units SQ twice daily at home -Along with sliding scale insulin with NovoLog -Hypoglycemia has resolved, will restart Lantus at l lower dose of 30 units subcu twice daily -Continue home sliding scale insulin with NovoLog   Hypertension -Blood pressure is mildly elevated -We will start amlodipine 5 mg daily -Discontinue Catapres   CKD stage IIIb -Creatinine 1.09 today -At baseline   Leukocytosis -WBC improved to 15,000  -Likely reactive -Blood cultures x2 are negative to date    Procedures:   Consultations:   Discharge Exam: Vitals:   01/21/21 1149 01/21/21 1356  BP: (!) 152/90 (!) 146/82  Pulse: (!) 102 (!) 109  Resp: (!) 22 (!) 23  Temp: 98.5 F (36.9 C) 98.6 F (37 C)  SpO2: 100% 98%    General: Appears in no acute distress Cardiovascular: S1-S2, regular, no murmur auscultated Respiratory: Clear to auscultation bilaterally  Discharge Instructions   Discharge Instructions     Diet - low sodium heart healthy   Complete by: As directed    Increase activity slowly   Complete by: As directed       Allergies as of 01/21/2021   No Known Allergies      Medication List     STOP taking these medications    cloNIDine 0.1 MG tablet Commonly known as: CATAPRES       TAKE these medications    Accu-Chek Guide Me w/Device Kit Use to check blood sugar TID. E11.65   amLODipine 5 MG tablet Commonly known as: NORVASC Take 1  tablet (5 mg total) by mouth daily. Start taking on: January 22, 2021   FreeStyle Libre 14 Day Reader [place] Use as instructed. Check blood glucose level by fingerstick 5 times per day. E11.65 Z79.4   FreeStyle Libre 2 Reader [place] Use as instructed. Check blood glucose level by fingerstick 5 times per day. E11.65 Z79.4    FreeStyle Libre 14 Day Sensor Misc Use as instructed. Check blood glucose level by fingerstick 5 times per day. E11.65 Z79.4   FreeStyle Libre 2 [employer] Use as instructed. Check blood glucose level by fingerstick 5 times per day. E11.65 Z79.4   insulin aspart 100 UNIT/ML FlexPen Commonly known as: NOVOLOG Inject 2-10 Units into the skin 3 (three) times daily with meals. Glucose 151-200 take 2 units, glucose 201-250 take 4 units, glucose 251-300 take 6 units, glucose 301-350 take 8 units, glucose 351-400 take 10 units   Lantus SoloStar 100 UNIT/ML Solostar Pen Generic drug: insulin glargine Inject 30 Units into the skin 2 (two) times daily. What changed: how much to take   Suboxone 8-2 MG Film Generic drug: Buprenorphine HCl-Naloxone HCl Take 1 Film by mouth 3 (three) times daily.   TRUEplus 5-Bevel Pen Needles 32G X 4 MM Misc Generic drug: Insulin Pen Needle Use as instructed to inject insulin.       No Known Allergies    The results of significant diagnostics from this hospitalization (including imaging, microbiology, ancillary and laboratory) are listed below for reference.    Significant Diagnostic Studies: DG CHEST PORT 1 VIEW  Result Date: 01/20/2021 CLINICAL DATA:  Cocaine abuse EXAM: PORTABLE CHEST 1 VIEW COMPARISON:  10/08/2020 FINDINGS: The heart size and mediastinal contours are within normal limits. Both lungs are clear. The visualized skeletal structures are unremarkable. IMPRESSION: No active disease. Electronically Signed   By: Ulyses Jarred M.D.   On: 01/20/2021 03:12    Microbiology: Recent Results (from the past 240 hour(s))  Culture, blood (routine x 2)     Status: None (Preliminary result)   Collection Time: 01/20/21  7:37 AM   Specimen: BLOOD RIGHT HAND  Result Value Ref Range Status   Specimen Description BLOOD RIGHT HAND  Final   Special Requests   Final    BOTTLES DRAWN AEROBIC AND ANAEROBIC Blood Culture adequate volume   Culture   Final     NO GROWTH 1 DAY Performed at [facility], [place] [address], [place], [place] [address]    Report Status PENDING  Incomplete  Culture, blood (routine x 2)     Status: None (Preliminary result)   Collection Time: 01/20/21  7:50 AM   Specimen: BLOOD RIGHT HAND  Result Value Ref Range Status   Specimen Description BLOOD RIGHT HAND  Final   Special Requests   Final    BOTTLES DRAWN AEROBIC AND ANAEROBIC Blood Culture adequate volume   Culture   Final    NO GROWTH 1 DAY Performed at [facility], [place] [address], [place], [place] [address]    Report Status PENDING  Incomplete  Resp Panel by RT-PCR (Flu A&B, Covid) Nasopharyngeal Swab     Status: None   Collection Time: 01/20/21  9:12 AM   Specimen: Nasopharyngeal Swab; Nasopharyngeal(NP) swabs in vial transport medium  Result Value Ref Range Status   SARS Coronavirus 2 by RT PCR NEGATIVE NEGATIVE Final    Comment: (NOTE) SARS-CoV-2 target nucleic acids are NOT DETECTED.  The SARS-CoV-2 RNA is generally detectable in upper respiratory specimens during  the acute phase of infection. The lowest concentration of SARS-CoV-2 viral copies this assay can detect is 138 copies/mL. A negative result does not preclude SARS-Cov-2 infection and should not be used as the sole basis for treatment or other patient management decisions. A negative result may occur with  improper specimen collection/handling, submission of specimen other than nasopharyngeal swab, presence of viral mutation(s) within the areas targeted by this assay, and inadequate number of viral copies(<138 copies/mL). A negative result must be combined with clinical observations, patient history, and epidemiological information. The expected result is Negative.  Fact Sheet for Patients:  EntrepreneurPulse.com.au  Fact Sheet for Healthcare Providers:  IncredibleEmployment.be  This test is no t yet approved or cleared by the [place]  FDA and  has been authorized for detection and/or diagnosis of SARS-CoV-2 by FDA under an Emergency Use Authorization (EUA). This EUA will remain  in effect (meaning this test can be used) for the duration of the COVID-19 declaration under Section 564(b)(1) of the Act, 21 U.S.C.section 360bbb-3(b)(1), unless the authorization is terminated  or revoked sooner.       Influenza A by PCR NEGATIVE NEGATIVE Final   Influenza B by PCR NEGATIVE NEGATIVE Final    Comment: (NOTE) The Xpert Xpress SARS-CoV-2/FLU/RSV plus assay is intended as an aid in the diagnosis of influenza from Nasopharyngeal swab specimens and should not be used as a sole basis for treatment. Nasal washings and aspirates are unacceptable for Xpert Xpress SARS-CoV-2/FLU/RSV testing.  Fact Sheet for Patients: EntrepreneurPulse.com.au  Fact Sheet for Healthcare Providers: IncredibleEmployment.be  This test is not yet approved or cleared by the [place] FDA and has been authorized for detection and/or diagnosis of SARS-CoV-2 by FDA under an Emergency Use Authorization (EUA). This EUA will remain in effect (meaning this test can be used) for the duration of the COVID-19 declaration under Section 564(b)(1) of the Act, 21 U.S.C. section 360bbb-3(b)(1), unless the authorization is terminated or revoked.  Performed at [facility], [place] [address], [place], [place] [address]      Labs: Basic Metabolic Panel: Recent Labs  Lab 01/19/21 2100 01/20/21 0749  NA 140 137  K 3.5 4.0  CL 106 105  CO2 24 23  GLUCOSE 33* 73  BUN 22* 18  CREATININE 1.26* 1.09  CALCIUM 9.2 9.0   Liver Function Tests: Recent Labs  Lab 01/20/21 0749  AST 23  ALT 20  ALKPHOS 76  BILITOT 0.4  PROT 6.0*  ALBUMIN 3.4*   No results for input(s): LIPASE, AMYLASE in the last 168 hours. No results for input(s): AMMONIA in the last 168 hours. CBC: Recent Labs  Lab 01/19/21 2100  01/20/21 0749  WBC 17.7* 15.1*  HGB 13.5 12.6*  HCT 40.5 38.3*  MCV 83.7 85.3  PLT 370 314   Cardiac Enzymes: No results for input(s): CKTOTAL, CKMB, CKMBINDEX, TROPONINI in the last 168 hours. BNP: BNP (last 3 results) No results for input(s): BNP in the last 8760 hours.  ProBNP (last 3 results) No results for input(s): PROBNP in the last 8760 hours.  CBG: Recent Labs  Lab 01/20/21 1145 01/20/21 1529 01/20/21 1949 01/21/21 0739 01/21/21 1145  GLUCAP 187* 351* 273* 190* 301*       Signed:  Oswald Hillock MD.  [facility] 01/21/2021, 2:49 PM

## 2021-01-21 NOTE — TOC Transition Note (Signed)
Transition of Care [facility]) - CM/SW Discharge Note   Patient Details  Name: GOTTI ALWIN MRN: [id number] Date of Birth: [birth year]  Transition of [facility]) CM/SW Contact:  Elliot Gurney [place], [place] Phone Number: 01/21/2021, 3:44 PM   Clinical Narrative:    Patient to discharge home today. Patient's spouse will provide transport home. Notice of commitment change faxed to the magistrate. Substance abuse treatment discussed with patient. Patient willing to return to [facility] for Suboxone Treatment. This program is closed today. Patient agreeable to follow up on Monday.   [address], LCSW Transition of Care [phone]          Patient Goals and CMS Choice        Discharge Placement                       Discharge Plan and Services                                     Social Determinants of Health (SDOH) Interventions     Readmission Risk Interventions Readmission Risk Prevention Plan 09/10/2019 09/09/2019 05/11/2019  Transportation Screening Complete - Complete  PCP or Specialist Appt within 5-7 Days - - Not Complete  Not Complete comments - - pt agreeable when ready for dc.  Home Care Screening - - Complete  Medication Review (RN CM) - - Referral to Pharmacy  Medication Review (RN Care Manager) Complete - -  PCP or Specialist appointment within 3-5 days of discharge Complete - -  [place] or Home Care Consult Complete - -  SW Recovery Care/Counseling Consult Complete - -  Palliative Care Screening Not Applicable Not Applicable -  [place] Not Applicable Not Applicable -  Some recent data might be hidden

## 2021-01-23 ENCOUNTER — Telehealth: Payer: Self-pay

## 2021-01-23 NOTE — Telephone Encounter (Signed)
Transition Care Management Follow-up Telephone Call Date of discharge and from where: 01/21/2021, [facility]  How have you been since you were released from the hospital? He said he is feeling fine. He called [facility] this morning and is waiting for a call back.  He needs the suboxone.  Any questions or concerns? No  Items Reviewed: Did the pt receive and understand the discharge instructions provided?  He said he has them but is not sure where they are. Medications obtained and verified?  He said that he has the insulins and amlodipine. As noted above he needs suboxone. The new order for lantus was reviewed and he said he understood.Marland Kitchen He also a glucometer. He is not able to administer the novolog with a sliding scale without a glucometer to check his blood sugars. . He does not have a [employer] as noted on his AVS.  Other? No  Any new allergies since your discharge? No  Dietary orders reviewed? Yes Do you have support at home? Yes   Home Care and Equipment/Supplies: Were home health services ordered? no If so, what is the name of the agency? N/a  Has the agency set up a time to come to the patient's home? not applicable Were any new equipment or medical supplies ordered?  No What is the name of the medical supply agency? N/a Were you able to get the supplies/equipment? not applicable Do you have any questions related to the use of the equipment or supplies? No  Functional Questionnaire: (I = Independent and D = Dependent) ADLs: independent  Follow up appointments reviewed:  PCP Hospital f/u appt confirmed? Yes  Scheduled to see Geryl Rankins, NP on 01/27/2021  Specialist Hospital f/u appt confirmed?  None scheduled at this time   Are transportation arrangements needed? No  If their condition worsens, is the pt aware to call PCP or go to the Emergency Dept.? Yes Was the patient provided with contact information for the PCP's office or ED? Yes Was to pt  encouraged to call back with questions or concerns? Yes

## 2021-01-25 LAB — CULTURE, BLOOD (ROUTINE X 2)
Culture: NO GROWTH
Culture: NO GROWTH
Special Requests: ADEQUATE
Special Requests: ADEQUATE

## 2021-01-27 ENCOUNTER — Other Ambulatory Visit: Payer: Self-pay

## 2021-01-27 ENCOUNTER — Other Ambulatory Visit: Payer: Self-pay | Admitting: Pharmacist

## 2021-01-27 ENCOUNTER — Encounter: Payer: Self-pay | Admitting: Nurse Practitioner

## 2021-01-27 ENCOUNTER — Ambulatory Visit: Payer: Medicaid Other | Attending: Nurse Practitioner | Admitting: Nurse Practitioner

## 2021-01-27 VITALS — BP 134/90 | HR 98 | Ht 69.0 in | Wt 181.2 lb

## 2021-01-27 DIAGNOSIS — E1059 Type 1 diabetes mellitus with other circulatory complications: Secondary | ICD-10-CM | POA: Diagnosis not present

## 2021-01-27 DIAGNOSIS — Z09 Encounter for follow-up examination after completed treatment for conditions other than malignant neoplasm: Secondary | ICD-10-CM | POA: Diagnosis not present

## 2021-01-27 DIAGNOSIS — N521 Erectile dysfunction due to diseases classified elsewhere: Secondary | ICD-10-CM | POA: Diagnosis not present

## 2021-01-27 MED ORDER — SILDENAFIL CITRATE 100 MG PO TABS
50.0000 mg | ORAL_TABLET | Freq: Every day | ORAL | 6 refills | Status: DC | PRN
  Filled 2021-01-27: qty 10, 10d supply, fill #0

## 2021-01-27 MED ORDER — FREESTYLE LIBRE 2 SENSOR MISC
11 refills | Status: DC
  Filled 2021-01-27: qty 2, 28d supply, fill #0
  Filled 2021-01-27: qty 1, 14d supply, fill #0
  Filled 2021-02-14: qty 2, 28d supply, fill #1
  Filled 2021-03-13: qty 2, 28d supply, fill #2
  Filled 2021-03-29 – 2021-04-13 (×4): qty 2, 28d supply, fill #0

## 2021-01-27 MED ORDER — LISINOPRIL 5 MG PO TABS
5.0000 mg | ORAL_TABLET | Freq: Every day | ORAL | 3 refills | Status: DC
  Filled 2021-01-27: qty 90, 90d supply, fill #0

## 2021-01-27 MED ORDER — FREESTYLE LIBRE 14 DAY READER DEVI
0 refills | Status: AC
  Filled 2021-01-27 – 2021-10-23 (×2): qty 1, 14d supply, fill #0

## 2021-01-27 MED ORDER — FREESTYLE LIBRE 2 READER DEVI
11 refills | Status: AC
  Filled 2021-01-27: qty 1, fill #0
  Filled 2021-01-27: qty 1, 90d supply, fill #0

## 2021-01-27 NOTE — Progress Notes (Signed)
Assessment & Plan:  Luke [name] was seen today for hypertension and hospitalization follow-up.  Diagnoses and all orders for this visit:  Hospital discharge follow-up  Type 1 diabetes mellitus with other circulatory complication ([name]) -     Continuous Blood Gluc Receiver (FREESTYLE LIBRE 14 DAY READER) DEVI; Use as instructed. Check blood glucose level by fingerstick 5 times per day. E11.65 Z79.4 -     Continuous Blood Gluc Receiver (FREESTYLE LIBRE 2 READER) DEVI; Use as instructed. Check blood glucose level by fingerstick 5 times per day. E11.65 Z79.4 -     lisinopril (ZESTRIL) 5 MG tablet; Take 1 tablet (5 mg total) by mouth daily.  Erectile dysfunction due to diseases classified elsewhere -     sildenafil (VIAGRA) 100 MG tablet; Take 0.5-1 tablets (50-100 mg total) by mouth daily as needed for erectile dysfunction.   Patient has been counseled on age-appropriate routine health concerns for screening and prevention. These are reviewed and up-to-date. Referrals have been placed accordingly. Immunizations are up-to-date or declined.    Subjective:   Chief Complaint  Patient presents with   Hypertension   Hospitalization Follow-up   Luke [name] 40 y.o. male presents to office today for HFU  He has a past medical history of Anemia, Anxiety ( Dx 2008), Cellulitis, Chronic HEP C, CKD, IDDM (Dx 2008), Endocarditis (04/2019), GERD (Dx 2008),  Heart murmur, Hepatitis C, Hypertension, Hypothyroidism, MRSA infection, Pneumonia, Seizures (2011 ), Substance abuse (2013 ) He has a history of Endocarditis and CHF. It does not appear there was a follow up TEE. He needs to be scheduled with Cardiology for further evaluation. History of heroin abuse  Admitted for 3 days on 01-19-2021 with AMS, Encephalopathy, polysubstance abuse and hypoglycemia. UDS positive for cocaine and opiates. Hospital course was complicated by hypoglycemia requiring adjustment of his lantus dose. He was started on amlodipine  5 mg for HTN and clonidine was dc'd.   Today he states he is doing okay. He is currently in the process of trying to get re established with a suboxone clinic. Blood pressure slightly elevated. He will be picking up his lisinopril and amlodipine today. Requesting medication for erectile dysfunction. I have instructed him that his ED may likely be related to his diabetes which is not well controlled at this time.  BP Readings from Last 3 Encounters:  01/27/21 134/90  01/21/21 (!) 146/82  10/10/20 (!) 151/81   Lab Results  Component Value Date   HGBA1C 8.3 (H) 01/21/2021       Review of Systems  Constitutional:  Negative for fever, malaise/fatigue and weight loss.  HENT: Negative.  Negative for nosebleeds.   Eyes: Negative.  Negative for blurred vision, double vision and photophobia.  Respiratory: Negative.  Negative for cough and shortness of breath.   Cardiovascular: Negative.  Negative for chest pain, palpitations and leg swelling.  Gastrointestinal: Negative.  Negative for heartburn, nausea and vomiting.  Musculoskeletal: Negative.  Negative for myalgias.  Neurological: Negative.  Negative for dizziness, focal weakness, seizures and headaches.  Psychiatric/Behavioral:  Positive for substance abuse. Negative for suicidal ideas.    Past Medical History:  Diagnosis Date   Anemia    Anxiety  Dx 2008   Cellulitis    Chronic kidney disease    Diabetes mellitus without complication ([place]) Dx 0034   DKA (diabetic ketoacidoses) 07/21/2017   Endocarditis 04/2019   Endocarditis    GERD (gastroesophageal reflux disease) Dx 2008   Headache(784.0)    Heart murmur  Hepatitis C    Hypertension    Hypothyroidism    MRSA infection    Necrotizing fasciitis of multiple sites ([place])    Pneumonia    Seizures ([place]) 2011   x 2 in lifetime. on Dilantin for a while.    Sepsis ([place])    Substance abuse ([place]) 2013   heroin use, multiple relapses   Type 1 diabetes [facility])     Past Surgical  History:  Procedure Laterality Date   AMPUTATION Left 09/05/2018   Procedure: LEFT GREAT TOE AMPUTATION;  Surgeon: [name], MD;  Location: [place];  Service: Orthopedics;  Laterality: Left;   APPLICATION OF A-CELL OF EXTREMITY Bilateral 09/01/2019   Procedure: APPLICATION OF MESHED PRIMATRIX AG OF EXTREMITY;  Surgeon: Cindra Presume, MD;  Location: [place];  Service: Plastics;  Laterality: Bilateral;   I & D EXTREMITY Left 10/11/2012   Procedure: IRRIGATION AND DEBRIDEMENT ABSCESS FOREARM;  Surgeon: Linna Hoff, MD;  Location: [place];  Service: Orthopedics;  Laterality: Left;   I & D EXTREMITY Left 10/12/2012   Procedure: IRRIGATION AND DEBRIDEMENT FOREARM;  Surgeon: Linna Hoff, MD;  Location: [place];  Service: Orthopedics;  Laterality: Left;   I & D EXTREMITY Left 10/14/2012   Procedure: incision and drainage left forearm;  Surgeon: Linna Hoff, MD;  Location: [place];  Service: Orthopedics;  Laterality: Left;   I & D EXTREMITY Left 10/16/2012   Procedure: IRRIGATION AND DEBRIDEMENT LEFT FOREARM;  Surgeon: Linna Hoff, MD;  Location: [place];  Service: Orthopedics;  Laterality: Left;   I & D EXTREMITY Left 10/20/2012   Procedure: INCISION AND DRAINAGE AND DEBRIDEMENT LEFT  FOREARM;  Surgeon: Linna Hoff, MD;  Location: [place];  Service: Orthopedics;  Laterality: Left;   I & D EXTREMITY Right 05/22/2019   Procedure: IRRIGATION AND DEBRIDEMENT RIGHT ARM;  Surgeon: [name], MD;  Location: [place];  Service: Orthopedics;  Laterality: Right;   INCISION AND DRAINAGE OF WOUND Bilateral 11/29/2017   Procedure: DEBRIDEMENT BILATERAL FEET, DEBRIDEMENT LEFT ANKLE, AND APPLY WOUND VAC;  Surgeon: [name], MD;  Location: [place];  Service: Orthopedics;  Laterality: Bilateral;   INCISION AND DRAINAGE OF WOUND Bilateral 09/01/2019   Procedure: IRRIGATION AND DEBRIDEMENT FOREARM WOUNDS;  Surgeon: Cindra Presume, MD;  Location: [place];  Service: Plastics;  Laterality: Bilateral;   IRRIGATION AND  DEBRIDEMENT ABSCESS     Hx: of left arm abscess related to drug use    TEE WITHOUT CARDIOVERSION N/A 11/28/2017   Procedure: TRANSESOPHAGEAL ECHOCARDIOGRAM (TEE);  Surgeon: Sanda Klein, MD;  Location: [place];  Service: Cardiovascular;  Laterality: N/A;   TEE WITHOUT CARDIOVERSION N/A 05/12/2019   Procedure: TRANSESOPHAGEAL ECHOCARDIOGRAM (TEE);  Surgeon: Skeet Latch, MD;  [facility] END[facility]COPY;  Service: Cardiovascular;  Laterality: N/A;    Family History  Problem Relation Age of Onset   Diabetes Father    Heart disease Father    Mental illness Sister    Cancer Neg Hx     Social History Reviewed with no changes to be made today.   Outpatient Medications Prior to Visit  Medication Sig Dispense Refill   amLODipine (NORVASC) 5 MG tablet Take 1 tablet (5 mg total) by mouth daily. 30 tablet 2   Blood Glucose Monitoring Suppl (ACCU-CHEK GUIDE ME) w/Device KIT Use to check blood sugar TID. E11.65 1 kit 0   insulin glargine (LANTUS SOLOSTAR) 100 UNIT/ML Solostar Pen Inject 30 Units into the  skin 2 (two) times daily. 30 mL 1   Insulin Pen Needle (TRUEPLUS 5-BEVEL PEN NEEDLES) 32G X 4 MM MISC Use as instructed to inject insulin. 100 each 2   SUBOXONE 8-2 MG FILM Take 1 Film by mouth 3 (three) times daily.     Continuous Blood Gluc Sensor (FREESTYLE LIBRE 2 SENSOR) MISC Use as instructed. Check blood glucose level by fingerstick 5 times per day. E11.65 Z79.4 1 each 11   Continuous Blood Gluc Sensor (FREESTYLE LIBRE 14 DAY SENSOR) MISC Use as instructed. Check blood glucose level by fingerstick 5 times per day. E11.65 Z79.4 (Patient not taking: Reported on 01/27/2021) 1 each 6   insulin aspart (NOVOLOG) 100 UNIT/ML FlexPen Inject 2-10 Units into the skin 3 (three) times daily with meals. Glucose 151-200 take 2 units, glucose 201-250 take 4 units, glucose 251-300 take 6 units, glucose 301-350 take 8 units, glucose 351-400 take 10 units 9 mL 2   Continuous Blood Gluc Receiver (FREESTYLE  LIBRE 14 DAY READER) DEVI Use as instructed. Check blood glucose level by fingerstick 5 times per day. E11.65 Z79.4 (Patient not taking: Reported on 01/27/2021) 1 each 0   Continuous Blood Gluc Receiver (FREESTYLE LIBRE 2 READER) DEVI Use as instructed. Check blood glucose level by fingerstick 5 times per day. E11.65 Z79.4 (Patient not taking: Reported on 01/27/2021) 1 each 11   No facility-administered medications prior to visit.    No Known Allergies     Objective:    BP 134/90   Pulse 98   Ht $R'5\' 9"'fJ$  (1.753 m)   Wt 181 lb 4 oz (82.2 kg)   SpO2 98%   BMI 26.77 kg/m  Wt Readings from Last 3 Encounters:  01/27/21 181 lb 4 oz (82.2 kg)  01/21/21 174 lb 9.7 oz (79.2 kg)  10/10/20 175 lb 7.8 oz (79.6 kg)    Physical Exam Vitals and nursing note reviewed.  Constitutional:      Appearance: He is well-developed.  HENT:     Head: Normocephalic and atraumatic.  Cardiovascular:     Rate and Rhythm: Normal rate and regular rhythm.     Heart sounds: Normal heart sounds. No murmur heard.   No friction rub. No gallop.  Pulmonary:     Effort: Pulmonary effort is normal. No tachypnea or respiratory distress.     Breath sounds: Normal breath sounds. No decreased breath sounds, wheezing, rhonchi or rales.  Chest:     Chest wall: No tenderness.  Abdominal:     General: Bowel sounds are normal.     Palpations: Abdomen is soft.  Musculoskeletal:        General: Normal range of motion.     Cervical back: Normal range of motion.  Skin:    General: Skin is warm and dry.  Neurological:     Mental Status: He is alert and oriented to person, place, and time.     Coordination: Coordination normal.  Psychiatric:        Behavior: Behavior normal. Behavior is cooperative.        Thought Content: Thought content normal.        Judgment: Judgment normal.         Patient has been counseled extensively about nutrition and exercise as well as the importance of adherence with medications and  regular follow-up. The patient was given clear instructions to go to ER or return to medical center if symptoms don't improve, worsen or new problems develop. The patient verbalized understanding.  Follow-up: Return in about 3 months (around 04/29/2021) for see me in 3 months. needs blood work 4 weeks.   Gildardo Pounds, FNP-BC [facility] and [place], [place]   01/31/2021, 9:49 PM

## 2021-01-31 ENCOUNTER — Encounter: Payer: Self-pay | Admitting: Nurse Practitioner

## 2021-02-01 ENCOUNTER — Other Ambulatory Visit: Payer: Self-pay

## 2021-02-14 ENCOUNTER — Other Ambulatory Visit: Payer: Self-pay

## 2021-02-24 ENCOUNTER — Other Ambulatory Visit: Payer: Self-pay

## 2021-02-24 ENCOUNTER — Ambulatory Visit: Payer: Medicaid Other

## 2021-03-14 ENCOUNTER — Other Ambulatory Visit: Payer: Self-pay

## 2021-03-28 ENCOUNTER — Other Ambulatory Visit: Payer: Self-pay | Admitting: Nurse Practitioner

## 2021-03-28 NOTE — Telephone Encounter (Signed)
Requested medication (s) are due for refill today: yes  Requested medication (s) are on the active medication list: yes  Last refill:  01/21/21-04/06/21 #30 ml 1 refill   Future visit scheduled: yes 1 month  Notes to clinic:  do you want to refill Rx? Last ordered by Eleonore Chiquito , MD     Requested Prescriptions  Pending Prescriptions Disp Refills   insulin glargine (LANTUS SOLOSTAR) 100 UNIT/ML Solostar Pen 30 mL 1    Sig: Inject 30 Units into the skin 2 (two) times daily.     Endocrinology:  Diabetes - Insulins Failed - 03/28/2021  4:16 PM      Failed - HBA1C is between 0 and 7.9 and within 180 days    Hemoglobin A1C  Date Value Ref Range Status  04/11/2014 11.3 (H) 4.2 - 6.3 % Final    Comment:    The American Diabetes Association recommends that a primary goal of therapy should be <7% and that physicians should reevaluate the treatment regimen in patients with HbA1c values consistently >8%.    HbA1c POC (<> result, manual entry)  Date Value Ref Range Status  03/24/2018 >15 4.0 - 5.6 % Final   Hgb A1c MFr Bld  Date Value Ref Range Status  01/21/2021 8.3 (H) 4.8 - 5.6 % Final    Comment:    (NOTE) Pre diabetes:          5.7%-6.4%  Diabetes:              >6.4%  Glycemic control for   <7.0% adults with diabetes           Passed - Valid encounter within last 6 months    Recent Outpatient Visits           2 months ago Hospital discharge follow-up   [place], Zelda W, NP   5 months ago Hospital discharge follow-up   [place], [place] W, NP   9 months ago Insulin dependent type 2 diabetes mellitus [facility])   [place] Gildardo Pounds, NP   1 year ago Hospital discharge follow-up   [place] Gildardo Pounds, NP   1 year ago Hypokalemia   [place], Dionne Bucy, [place]       Future  Appointments             In 1 month Gildardo Pounds, NP [place]

## 2021-03-28 NOTE — Telephone Encounter (Signed)
Medication Refill - Medication: insulin glargine (LANTUS SOLOSTAR) 100 UNIT/ML Solostar Pen  Has the patient contacted their pharmacy? Yes.   (Agent: If no, request that the patient contact the pharmacy for the refill. If patient does not wish to contact the pharmacy document the reason why and proceed with request.) (Agent: If yes, when and what did the pharmacy advise?)  Preferred Pharmacy (with phone number or street name):  Has the patient been seen for an appointment in the last year OR does the patient have an upcoming appointment? Yes.    Agent: Please be advised that RX refills may take up to 3 business days. We ask that you follow-up with your pharmacy.

## 2021-03-29 ENCOUNTER — Other Ambulatory Visit: Payer: Self-pay

## 2021-03-30 ENCOUNTER — Other Ambulatory Visit: Payer: Self-pay

## 2021-03-30 MED ORDER — LANTUS SOLOSTAR 100 UNIT/ML ~~LOC~~ SOPN
30.0000 [IU] | PEN_INJECTOR | Freq: Two times a day (BID) | SUBCUTANEOUS | 1 refills | Status: DC
  Filled 2021-03-30: qty 15, 25d supply, fill #0

## 2021-04-04 ENCOUNTER — Other Ambulatory Visit: Payer: Self-pay

## 2021-04-11 ENCOUNTER — Other Ambulatory Visit: Payer: Self-pay

## 2021-04-13 ENCOUNTER — Other Ambulatory Visit: Payer: Self-pay

## 2021-04-27 ENCOUNTER — Other Ambulatory Visit: Payer: Self-pay

## 2021-04-27 ENCOUNTER — Other Ambulatory Visit: Payer: Self-pay | Admitting: Nurse Practitioner

## 2021-04-27 MED ORDER — FREESTYLE LIBRE 2 SENSOR MISC
11 refills | Status: DC
  Filled 2021-04-27: qty 1, fill #0
  Filled 2021-04-28: qty 1, 14d supply, fill #0
  Filled 2021-05-03 – 2021-05-04 (×2): qty 2, 28d supply, fill #0
  Filled 2021-05-24 – 2021-05-25 (×2): qty 2, 28d supply, fill #1
  Filled 2021-06-21: qty 2, 28d supply, fill #2
  Filled 2021-07-17: qty 2, 28d supply, fill #3
  Filled 2021-08-07 – 2021-08-09 (×2): qty 2, 28d supply, fill #4

## 2021-04-27 NOTE — Telephone Encounter (Signed)
Requested Prescriptions  Pending Prescriptions Disp Refills   Continuous Blood Gluc Sensor (FREESTYLE LIBRE 2 SENSOR) MISC 1 each 11    Sig: Use as instructed. Check blood glucose level by fingerstick 5 times per day. E11.65 Z79.4     Endocrinology: Diabetes - Testing Supplies Passed - 04/27/2021 12:35 PM      Passed - Valid encounter within last 12 months    Recent Outpatient Visits          3 months ago Hospital discharge follow-up   [place], Zelda W, NP   6 months ago Hospital discharge follow-up   [place] Gildardo Pounds, NP   10 months ago Insulin dependent type 2 diabetes mellitus [facility])   [place] Gildardo Pounds, NP   1 year ago Hospital discharge follow-up   [place] Gildardo Pounds, NP   1 year ago Hypokalemia   [place], Dionne Bucy, [place]      Future Appointments            In 4 days Gildardo Pounds, NP [place]

## 2021-04-27 NOTE — Telephone Encounter (Signed)
Medication Refill - Medication: Continuous Blood Gluc Sensor (FREESTYLE LIBRE 2 SENSOR) MISC  Pt has no more sensors left; the last one stopped working.   Has the patient contacted their pharmacy? Yes.   Has not been able to reach pharmacy.   (Agent: If yes, when and what did the pharmacy advise?)  Preferred Pharmacy (with phone number or street name):  [facility] [address] [place] [address]  Phone: [phone] Fax: [phone]  Hours: M-F 7:30a-6:00p   Has the patient been seen for an appointment in the last year OR does the patient have an upcoming appointment? Yes.    Agent: Please be advised that RX refills may take up to 3 business days. We ask that you follow-up with your pharmacy.

## 2021-04-28 ENCOUNTER — Other Ambulatory Visit: Payer: Self-pay

## 2021-05-01 ENCOUNTER — Other Ambulatory Visit: Payer: Self-pay

## 2021-05-01 ENCOUNTER — Encounter: Payer: Self-pay | Admitting: Nurse Practitioner

## 2021-05-01 ENCOUNTER — Ambulatory Visit: Payer: Medicaid Other | Attending: Nurse Practitioner | Admitting: Nurse Practitioner

## 2021-05-01 VITALS — BP 176/110 | HR 104 | Ht 69.0 in | Wt 175.1 lb

## 2021-05-01 DIAGNOSIS — F331 Major depressive disorder, recurrent, moderate: Secondary | ICD-10-CM

## 2021-05-01 DIAGNOSIS — N184 Chronic kidney disease, stage 4 (severe): Secondary | ICD-10-CM

## 2021-05-01 DIAGNOSIS — E1059 Type 1 diabetes mellitus with other circulatory complications: Secondary | ICD-10-CM | POA: Diagnosis not present

## 2021-05-01 DIAGNOSIS — I10 Essential (primary) hypertension: Secondary | ICD-10-CM | POA: Diagnosis not present

## 2021-05-01 DIAGNOSIS — Z8679 Personal history of other diseases of the circulatory system: Secondary | ICD-10-CM

## 2021-05-01 DIAGNOSIS — N521 Erectile dysfunction due to diseases classified elsewhere: Secondary | ICD-10-CM

## 2021-05-01 DIAGNOSIS — F1994 Other psychoactive substance use, unspecified with psychoactive substance-induced mood disorder: Secondary | ICD-10-CM

## 2021-05-01 LAB — POCT GLYCOSYLATED HEMOGLOBIN (HGB A1C): Hemoglobin A1C: 12.1 % — AB (ref 4.0–5.6)

## 2021-05-01 LAB — POCT URINALYSIS DIP (CLINITEK)
Bilirubin, UA: NEGATIVE
Blood, UA: NEGATIVE
Glucose, UA: 500 mg/dL — AB
Ketones, POC UA: NEGATIVE mg/dL
Leukocytes, UA: NEGATIVE
Nitrite, UA: NEGATIVE
POC PROTEIN,UA: 30 — AB
Spec Grav, UA: 1.01 (ref 1.010–1.025)
Urobilinogen, UA: 0.2 E.U./dL
pH, UA: 6.5 (ref 5.0–8.0)

## 2021-05-01 LAB — GLUCOSE, POCT (MANUAL RESULT ENTRY): POC Glucose: 600 mg/dl — AB (ref 70–99)

## 2021-05-01 MED ORDER — INSULIN ASPART 100 UNIT/ML IJ SOLN
20.0000 [IU] | Freq: Once | INTRAMUSCULAR | Status: AC
  Administered 2021-05-01: 20 [IU] via SUBCUTANEOUS

## 2021-05-01 MED ORDER — SILDENAFIL CITRATE 100 MG PO TABS
50.0000 mg | ORAL_TABLET | Freq: Every day | ORAL | 6 refills | Status: DC | PRN
Start: 2021-05-01 — End: 2021-10-23
  Filled 2021-05-01: qty 10, 10d supply, fill #0
  Filled 2021-07-17: qty 10, 30d supply, fill #0

## 2021-05-01 MED ORDER — ACCU-CHEK SOFTCLIX LANCETS MISC
0 refills | Status: AC
  Filled 2021-05-01: qty 200, 34d supply, fill #0

## 2021-05-01 MED ORDER — NOVOLOG MIX 70/30 FLEXPEN (70-30) 100 UNIT/ML ~~LOC~~ SUPN
45.0000 [IU] | PEN_INJECTOR | Freq: Two times a day (BID) | SUBCUTANEOUS | 1 refills | Status: DC
  Filled 2021-05-01: qty 30, 33d supply, fill #0
  Filled 2021-06-07: qty 30, 33d supply, fill #1
  Filled 2021-07-17: qty 30, 33d supply, fill #2
  Filled 2021-08-15: qty 30, 33d supply, fill #3
  Filled 2021-10-02: qty 30, 33d supply, fill #4

## 2021-05-01 MED ORDER — AMLODIPINE BESYLATE 10 MG PO TABS
10.0000 mg | ORAL_TABLET | Freq: Every day | ORAL | 1 refills | Status: DC
  Filled 2021-05-01: qty 90, 90d supply, fill #0

## 2021-05-01 MED ORDER — CLONIDINE HCL 0.2 MG PO TABS
0.2000 mg | ORAL_TABLET | Freq: Once | ORAL | Status: AC
  Administered 2021-05-01: 0.2 mg via ORAL

## 2021-05-01 MED ORDER — GLUCOSE BLOOD VI STRP
ORAL_STRIP | 0 refills | Status: DC
  Filled 2021-05-01: qty 150, 30d supply, fill #0

## 2021-05-01 MED ORDER — LISINOPRIL 5 MG PO TABS
5.0000 mg | ORAL_TABLET | Freq: Every day | ORAL | 3 refills | Status: DC
  Filled 2021-05-01: qty 90, 90d supply, fill #0

## 2021-05-01 MED ORDER — CARVEDILOL 12.5 MG PO TABS
12.5000 mg | ORAL_TABLET | Freq: Two times a day (BID) | ORAL | 1 refills | Status: DC
Start: 2021-05-01 — End: 2021-10-23
  Filled 2021-05-01: qty 180, 90d supply, fill #0

## 2021-05-01 NOTE — Progress Notes (Signed)
Assessment & Plan:  Luke [name] was seen today for diabetes.  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 -     lisinopril (ZESTRIL) 5 MG tablet; Take 1 tablet (5 mg total) by mouth daily. -     Ambulatory referral to Podiatry -     glucose blood test strip; use 1 test strip to check blood glucose 5 times per day -     POCT URINALYSIS DIP (CLINITEK) -     Ambulatory Referral to DSME/T  Erectile dysfunction due to diseases classified elsewhere -     sildenafil (VIAGRA) 100 MG tablet; Take 0.5-1 tablets (50-100 mg total) by mouth daily as needed for erectile dysfunction.  Primary hypertension -     cloNIDine (CATAPRES) tablet 0.2 mg -     carvedilol (COREG) 12.5 MG tablet; Take 1 tablet (12.5 mg total) by mouth 2 (two) times daily with a meal. -     amLODipine (NORVASC) 10 MG tablet; Take 1 tablet (10 mg total) by mouth daily.  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 These are reviewed and up-to-date. Referrals have been placed  accordingly. Immunizations are up-to-date or declined.    Subjective:   Chief Complaint  Patient presents with   Diabetes   HPI Luke [name] 41 y.o. male presents to office today for DM He has a past medical history of Anemia, Anxiety ( Dx 2008), Cellulitis, Chronic HEP C, CKD, IDDM (Dx 2008), Endocarditis (04/2019), GERD (Dx 2008),  Heart murmur, Hepatitis C, Hypertension, Hypothyroidism, MRSA infection, Pneumonia, Seizures (2011 ), Substance abuse (2013 )  IDDM Poorly controlled. I am referring to Endocrinology today as we are having a difficult time getting his diabetes under control. Currently prescribed novolog SSI and Lantus 40 units BID. He is requesting to switch back to Novolog 70/30 and states blood glucose levels were more controlled at that time however his A1c has consistently been elevated >10. Lab Results  Component Value Date   HGBA1C 12.1 (A) 05/01/2021    HTN  Blood pressure is significantly elevated today. Requiring clonidine 0.2 mg today. He is currently prescribed amlodipine 10 mg daily, lisinopril 5 mg daily and carvedilol 6.25 mg BID. Will increase carvedilol today to 12.5 mg BID.  BP Readings from Last 3 Encounters:  05/01/21 (!) 176/110  01/27/21 134/90  01/21/21 (!) 146/82     Review of Systems  Constitutional:  Negative for fever, malaise/fatigue and weight loss.  HENT: Negative.  Negative for nosebleeds.   Eyes: Negative.  Negative for blurred vision, double vision and photophobia.  Respiratory: Negative.  Negative for cough and shortness of breath.   Cardiovascular: Negative.  Negative for chest pain, palpitations and leg swelling.  Gastrointestinal: Negative.  Negative for heartburn, nausea and vomiting.  Musculoskeletal: Negative.  Negative for myalgias.  Neurological: Negative.  Negative for dizziness, focal weakness, seizures and headaches.  Psychiatric/Behavioral: Negative.  Negative for suicidal ideas.    Past Medical History:  Diagnosis Date    Anemia    Anxiety  Dx 2008   Cellulitis    Chronic kidney disease    Diabetes mellitus without complication ([place]) Dx 4540   DKA (diabetic ketoacidoses) 07/21/2017   Endocarditis 04/2019   Endocarditis    GERD (gastroesophageal reflux disease) Dx 2008   Headache(784.0)    Heart murmur    Hepatitis C    Hypertension    Hypothyroidism    MRSA infection    Necrotizing fasciitis of multiple sites ([place])    Pneumonia    Seizures ([place]) 2011   x 2 in lifetime. on Dilantin for a while.    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, multiple relapses   Type 1 diabetes [facility])     Past Surgical History:  Procedure Laterality Date   AMPUTATION Left 09/05/2018   Procedure: LEFT GREAT TOE AMPUTATION;  Surgeon: [name], MD;  Location: [place];  Service: Orthopedics;  Laterality: Left;   APPLICATION OF A-CELL OF EXTREMITY Bilateral 09/01/2019   Procedure: APPLICATION OF MESHED PRIMATRIX AG OF EXTREMITY;  Surgeon: Cindra Presume, MD;  Location: [place];  Service: Plastics;  Laterality: Bilateral;   I & D EXTREMITY Left 10/11/2012   Procedure: IRRIGATION AND DEBRIDEMENT ABSCESS FOREARM;  Surgeon: Linna Hoff, MD;  Location: [place];  Service: Orthopedics;  Laterality: Left;   I & D EXTREMITY Left 10/12/2012   Procedure: IRRIGATION AND DEBRIDEMENT FOREARM;  Surgeon: Linna Hoff, MD;  Location: [place];  Service: Orthopedics;  Laterality: Left;   I & D EXTREMITY Left 10/14/2012   Procedure: incision and drainage left forearm;  Surgeon: Linna Hoff, MD;  Location: [place];  Service: Orthopedics;  Laterality: Left;   I & D EXTREMITY Left 10/16/2012   Procedure: IRRIGATION AND DEBRIDEMENT LEFT FOREARM;  Surgeon: Linna Hoff, MD;  Location: [place];  Service: Orthopedics;  Laterality: Left;   I & D EXTREMITY Left 10/20/2012   Procedure: INCISION AND DRAINAGE AND DEBRIDEMENT LEFT  FOREARM;  Surgeon: Linna Hoff, MD;  Location: [place];  Service: Orthopedics;  Laterality: Left;   I & D  EXTREMITY Right 05/22/2019   Procedure: IRRIGATION AND DEBRIDEMENT RIGHT ARM;  Surgeon: [name], MD;  Location: [place];  Service: Orthopedics;  Laterality: Right;   INCISION AND DRAINAGE OF WOUND Bilateral 11/29/2017   Procedure: DEBRIDEMENT BILATERAL FEET, DEBRIDEMENT LEFT ANKLE, AND APPLY WOUND VAC;  Surgeon: [name], MD;  Location: [place];  Service: Orthopedics;  Laterality: Bilateral;   INCISION AND DRAINAGE OF WOUND Bilateral 09/01/2019   Procedure: IRRIGATION AND DEBRIDEMENT FOREARM WOUNDS;  Surgeon: Cindra Presume, MD;  Location: [place];  Service: Plastics;  Laterality: Bilateral;   IRRIGATION AND DEBRIDEMENT ABSCESS     Hx: of left arm abscess related to drug use    TEE WITHOUT CARDIOVERSION N/A 11/28/2017   Procedure: TRANSESOPHAGEAL ECHOCARDIOGRAM (TEE);  Surgeon: Sanda Klein, MD;  Location: [place];  Service: Cardiovascular;  Laterality: N/A;  TEE WITHOUT CARDIOVERSION N/A 05/12/2019   Procedure: TRANSESOPHAGEAL ECHOCARDIOGRAM (TEE);  Surgeon: Skeet Latch, MD;  Location[facility];  Service: Cardiovascular;  Laterality: N/A;    Family History  Problem Relation Age of Onset   Diabetes Father    Heart disease Father    Mental illness Sister    Cancer Neg Hx     Social History Reviewed with no changes to be made today.   Outpatient Medications Prior to Visit  Medication Sig Dispense Refill   Insulin Pen Needle (TRUEPLUS 5-BEVEL PEN NEEDLES) 32G X 4 MM MISC Use as instructed to inject insulin. 100 each 2   insulin glargine (LANTUS SOLOSTAR) 100 UNIT/ML Solostar Pen Inject 30 Units into the skin 2 (two) times daily. 15 mL 1   Blood Glucose Monitoring Suppl (ACCU-CHEK GUIDE ME) w/Device KIT Use to check blood sugar TID. E11.65 (Patient not taking: Reported on 05/01/2021) 1 kit 0   Continuous Blood Gluc Receiver (FREESTYLE LIBRE 14 DAY READER) DEVI Use as instructed. Check blood glucose level by fingerstick 5 times per day. E11.65 Z79.4 (Patient not taking:  Reported on 05/01/2021) 1 each 0   Continuous Blood Gluc Receiver (FREESTYLE LIBRE 2 READER) DEVI Use as instructed. Check blood glucose level by fingerstick 5 times per day. E11.65 Z79.4 (Patient not taking: Reported on 05/01/2021) 1 each 11   Continuous Blood Gluc Sensor (FREESTYLE LIBRE 14 DAY SENSOR) MISC Use as instructed. Check blood glucose level by fingerstick 5 times per day. E11.65 Z79.4 (Patient not taking: Reported on 05/01/2021) 1 each 6   Continuous Blood Gluc Sensor (FREESTYLE LIBRE 2 SENSOR) MISC Use as instructed. (Patient not taking: Reported on 05/01/2021) 1 each 11   insulin aspart (NOVOLOG) 100 UNIT/ML FlexPen Inject 2-10 Units into the skin 3 (three) times daily with meals. Glucose 151-200 take 2 units, glucose 201-250 take 4 units, glucose 251-300 take 6 units, glucose 301-350 take 8 units, glucose 351-400 take 10 units 9 mL 2   SUBOXONE 8-2 MG FILM Take 1 Film by mouth 3 (three) times daily. (Patient not taking: Reported on 05/01/2021)     amLODipine (NORVASC) 5 MG tablet Take 1 tablet (5 mg total) by mouth daily. (Patient not taking: Reported on 05/01/2021) 30 tablet 2   lisinopril (ZESTRIL) 5 MG tablet Take 1 tablet (5 mg total) by mouth daily. (Patient not taking: Reported on 05/01/2021) 90 tablet 3   sildenafil (VIAGRA) 100 MG tablet Take 0.5-1 tablets (50-100 mg total) by mouth daily as needed for erectile dysfunction. (Patient not taking: Reported on 05/01/2021) 10 tablet 6   No facility-administered medications prior to visit.    No Known Allergies     Objective:    BP (!) 176/110    Pulse (!) 104    Ht $R'5\' 9"'Qk$  (1.753 m)    Wt 175 lb 2 oz (79.4 kg)    SpO2 99%    BMI 25.86 kg/m  Wt Readings from Last 3 Encounters:  05/01/21 175 lb 2 oz (79.4 kg)  01/27/21 181 lb 4 oz (82.2 kg)  01/21/21 174 lb 9.7 oz (79.2 kg)    Physical Exam Vitals and nursing note reviewed.  Constitutional:      Appearance: He is well-developed.  HENT:     Head: Normocephalic and atraumatic.   Cardiovascular:     Rate and Rhythm: Regular rhythm. Tachycardia present.     Heart sounds: Normal heart sounds. No murmur heard.   No friction rub. No gallop.  Pulmonary:  Effort: Pulmonary effort is normal. No tachypnea or respiratory distress.     Breath sounds: Normal breath sounds. No decreased breath sounds, wheezing, rhonchi or rales.  Chest:     Chest wall: No tenderness.  Abdominal:     General: Bowel sounds are normal.     Palpations: Abdomen is soft.  Musculoskeletal:        General: Normal range of motion.     Cervical back: Normal range of motion.  Skin:    General: Skin is warm and dry.  Neurological:     Mental Status: He is alert and oriented to person, place, and time.     Coordination: Coordination normal.  Psychiatric:        Behavior: Behavior normal. Behavior is cooperative.        Thought Content: Thought content normal.        Judgment: Judgment normal.         Patient has been counseled extensively about nutrition and exercise as well as the importance of adherence with medications and regular follow-up. The patient was given clear instructions to go to ER or return to medical center if symptoms don't improve, worsen or new problems develop. The patient verbalized understanding.   Follow-up: Return for 2 weeks BP Check virtual on a tuesday. See me in office 3 months.   Gildardo Pounds, FNP-BC [facility] [place], [place]   05/02/2021, 8:52 PM

## 2021-05-02 ENCOUNTER — Encounter: Payer: Self-pay | Admitting: Nurse Practitioner

## 2021-05-02 DIAGNOSIS — N184 Chronic kidney disease, stage 4 (severe): Secondary | ICD-10-CM | POA: Insufficient documentation

## 2021-05-02 DIAGNOSIS — F331 Major depressive disorder, recurrent, moderate: Secondary | ICD-10-CM | POA: Insufficient documentation

## 2021-05-03 ENCOUNTER — Other Ambulatory Visit: Payer: Self-pay

## 2021-05-04 ENCOUNTER — Other Ambulatory Visit: Payer: Self-pay

## 2021-05-15 ENCOUNTER — Other Ambulatory Visit: Payer: Self-pay

## 2021-05-15 ENCOUNTER — Ambulatory Visit (INDEPENDENT_AMBULATORY_CARE_PROVIDER_SITE_OTHER): Payer: Medicaid Other | Admitting: Podiatry

## 2021-05-15 DIAGNOSIS — M79675 Pain in left toe(s): Secondary | ICD-10-CM | POA: Diagnosis not present

## 2021-05-15 DIAGNOSIS — B351 Tinea unguium: Secondary | ICD-10-CM

## 2021-05-15 DIAGNOSIS — Z89412 Acquired absence of left great toe: Secondary | ICD-10-CM

## 2021-05-15 DIAGNOSIS — B353 Tinea pedis: Secondary | ICD-10-CM

## 2021-05-15 DIAGNOSIS — M79674 Pain in right toe(s): Secondary | ICD-10-CM

## 2021-05-15 DIAGNOSIS — E1042 Type 1 diabetes mellitus with diabetic polyneuropathy: Secondary | ICD-10-CM | POA: Diagnosis not present

## 2021-05-15 DIAGNOSIS — M2042 Other hammer toe(s) (acquired), left foot: Secondary | ICD-10-CM

## 2021-05-15 MED ORDER — KETOCONAZOLE 2 % EX CREA: 1.0000 "application " | TOPICAL_CREAM | Freq: Every day | CUTANEOUS | 2 refills | Status: DC

## 2021-05-15 NOTE — Patient Instructions (Signed)
Call [phone]  to schedule

## 2021-05-16 ENCOUNTER — Encounter: Payer: Medicaid Other | Attending: Nurse Practitioner | Admitting: Nutrition

## 2021-05-16 DIAGNOSIS — E1065 Type 1 diabetes mellitus with hyperglycemia: Secondary | ICD-10-CM | POA: Diagnosis not present

## 2021-05-16 NOTE — Progress Notes (Signed)
°  Subjective:  Patient ID: Luke [name], male    DOB: Dec 14, 1980,  MRN: [id number]  Chief Complaint  Patient presents with   Diabetes    np-diabetic foot exam-non req-Zelda Raul Del, NP refer A1C  12.1    41 y.o. male presents with the above complaint. History confirmed with patient.  He was diagnosed at age 30 with type 1 diabetes.  His A1c is 12.1%.  He sees his endocrinologist tomorrow.  Previously had his left great toe amputated due to an infection and wound.  Objective:  Physical Exam: warm, good capillary refill, no trophic changes or ulcerative lesions, normal DP and PT pulses, and history of prior great toe amputation that is well-healed no active ulceration he has hammertoe flexion deformities on the left foot secondary to this, thickening of the skin with dry scaliness around the plantar heel and mid arch, and onychomycosis x9 with significant thickening brown and yellow discoloration and subungual debris.  Assessment:   1. Pain due to onychomycosis of toenails of both feet   2. Diabetic polyneuropathy associated with type 1 diabetes mellitus ([place])   3. Type 1 diabetes mellitus with diabetic polyneuropathy ([place])   4. History of amputation of left great toe ([place])   5. Hammertoe of left foot   6. Tinea pedis of both feet      Plan:  Patient was evaluated and treated and all questions answered.  Patient educated on diabetes. Discussed proper diabetic foot care and discussed risks and complications of disease. Educated patient in depth on reasons to return to the office immediately should he/she discover anything concerning or new on the feet. All questions answered. Discussed proper shoes as well.   Advised him on risk of continuing his A1c had a such a high level and further limb loss.  He is seeing his endocrinologist tomorrow to work on this.  Advised to inspect the feet daily and never go barefoot.  He will let me know if he has any issues that are concerning for  evaluation.  Discussed the etiology and treatment options for the condition in detail with the patient. Recommended debridement of the nails today. Sharp and mechanical debridement performed of all painful and mycotic nails today. Nails debrided in length and thickness using a nail nipper to level of comfort. Discussed treatment options including appropriate shoe gear. Follow up as needed for painful nails.    Return in about 1 year (around 05/15/2022) for diabetic foot exam .

## 2021-05-21 NOTE — Patient Instructions (Signed)
Take PM dose of insulin before supper. Take AM dose of insulin before drinking coffee Try Truvia in coffee STOP all sweet drinks Limit fried foods to no more than one at each meal Scan sensor first morning, apon waking, before meals and at bedtime. Return in 2 weeks

## 2021-05-21 NOTE — Progress Notes (Signed)
Patient reports that he is here today to review his diet. Says has never had any dietary education. Insulin dose:  Novolog mix 40u BID.  acB and HS.Says rately/ sometimes forgets HS dose,  SBGM:  LIbre 2 with reader:  13% or readings in the target range Diet: Typical day:   7AM:  Coffee with hot chocolatee, and 3 tsp. Of sugar 8AM:  takes insulin and eats  2 sausage biscuits Drinks coffee 2PM: regular [employer] 1 Can 6PM: Always having protein 5-7 ounces:  Chicken wings 10, or 2 pieces of chicken, 2 pork chops, 2 tacos  1-2 vegetables-usually one is a starch 30-45 grams  Regular Pepsie-1 can 10PM: 2 pieces of tast with butter  1AM: sleep Discussion:  He was shown how his insulin works, compared to his blood sugar rises from his meals and the need to take the PM dose of insulin before supper meal, to cover rise in blood sugar from supper through the night.  He reported good understanding of this.  Need to stop drinking sweet drinks.  Discussed the fact that blood sugar is rising to over 300 with each can of soda Need for balanced meal of carbs protein and fat, and which foods are in each group.  Did not want to overload patient, but too much information and suggested he return in 2 weeks for review of blood sugar readings when taking insulin acS Discussed need to limit above foods in each category, to prevent blood sugars from going high at any one time after eating.  Suggested only one high fat food at each meal--if fried chicken, no fried vegetables (french fries, fried okra, etc.  Same with protein- only 6 ounces per meal after cooking.   Discussed difference between sensor readings and blood sugar readings and need to scan sensor q 8 hours to capture 24 hour picture of blood sugar readings. Believe overall understanding is good, and suggested he return for cgm download in 2 weeks for review of diet--hoping he will see how high blood sugars going after drinking soda.

## 2021-05-24 ENCOUNTER — Other Ambulatory Visit: Payer: Self-pay

## 2021-05-25 ENCOUNTER — Other Ambulatory Visit: Payer: Self-pay

## 2021-06-07 ENCOUNTER — Other Ambulatory Visit: Payer: Self-pay

## 2021-06-21 ENCOUNTER — Other Ambulatory Visit: Payer: Self-pay

## 2021-06-22 ENCOUNTER — Other Ambulatory Visit: Payer: Self-pay

## 2021-06-23 ENCOUNTER — Other Ambulatory Visit: Payer: Self-pay

## 2021-07-18 ENCOUNTER — Other Ambulatory Visit: Payer: Self-pay

## 2021-08-08 ENCOUNTER — Other Ambulatory Visit: Payer: Self-pay

## 2021-08-09 ENCOUNTER — Other Ambulatory Visit: Payer: Self-pay

## 2021-08-10 ENCOUNTER — Other Ambulatory Visit: Payer: Self-pay | Admitting: Pharmacist

## 2021-08-10 ENCOUNTER — Other Ambulatory Visit: Payer: Self-pay

## 2021-08-10 MED ORDER — FREESTYLE LIBRE 14 DAY SENSOR MISC
6 refills | Status: DC
  Filled 2021-08-10: qty 1, 14d supply, fill #0
  Filled 2021-08-18: qty 2, 28d supply, fill #0

## 2021-08-16 ENCOUNTER — Other Ambulatory Visit: Payer: Self-pay

## 2021-08-17 ENCOUNTER — Other Ambulatory Visit: Payer: Self-pay

## 2021-08-18 ENCOUNTER — Other Ambulatory Visit: Payer: Self-pay | Admitting: Pharmacist

## 2021-08-18 ENCOUNTER — Other Ambulatory Visit: Payer: Self-pay

## 2021-08-18 MED ORDER — FREESTYLE LIBRE 2 SENSOR MISC
3 refills | Status: DC
  Filled 2021-08-18 – 2021-08-21 (×2): qty 2, 28d supply, fill #0
  Filled 2021-09-21: qty 2, 28d supply, fill #1
  Filled 2021-10-23: qty 2, 28d supply, fill #2

## 2021-08-21 ENCOUNTER — Other Ambulatory Visit: Payer: Self-pay

## 2021-08-24 ENCOUNTER — Other Ambulatory Visit: Payer: Self-pay

## 2021-09-12 ENCOUNTER — Other Ambulatory Visit (HOSPITAL_BASED_OUTPATIENT_CLINIC_OR_DEPARTMENT_OTHER): Payer: Self-pay

## 2021-09-19 ENCOUNTER — Observation Stay (HOSPITAL_COMMUNITY)
Admission: EM | Admit: 2021-09-19 | Discharge: 2021-09-20 | Disposition: A | Discharge status: Home / Self Care | Payer: Medicaid Other | Attending: Internal Medicine | Admitting: Internal Medicine

## 2021-09-19 ENCOUNTER — Encounter (HOSPITAL_COMMUNITY)
Admission: EM | Disposition: A | Discharge status: Home / Self Care | Payer: Self-pay | Source: Home / Self Care | Attending: Emergency Medicine

## 2021-09-19 ENCOUNTER — Other Ambulatory Visit: Payer: Self-pay

## 2021-09-19 ENCOUNTER — Emergency Department (HOSPITAL_COMMUNITY): Payer: Medicaid Other

## 2021-09-19 ENCOUNTER — Encounter (HOSPITAL_COMMUNITY): Payer: Self-pay

## 2021-09-19 DIAGNOSIS — I129 Hypertensive chronic kidney disease with stage 1 through stage 4 chronic kidney disease, or unspecified chronic kidney disease: Secondary | ICD-10-CM | POA: Diagnosis not present

## 2021-09-19 DIAGNOSIS — R55 Syncope and collapse: Principal | ICD-10-CM | POA: Insufficient documentation

## 2021-09-19 DIAGNOSIS — D72829 Elevated white blood cell count, unspecified: Secondary | ICD-10-CM | POA: Diagnosis not present

## 2021-09-19 DIAGNOSIS — E8729 Other acidosis: Secondary | ICD-10-CM

## 2021-09-19 DIAGNOSIS — N179 Acute kidney failure, unspecified: Secondary | ICD-10-CM | POA: Diagnosis not present

## 2021-09-19 DIAGNOSIS — F17210 Nicotine dependence, cigarettes, uncomplicated: Secondary | ICD-10-CM | POA: Diagnosis not present

## 2021-09-19 DIAGNOSIS — I10 Essential (primary) hypertension: Secondary | ICD-10-CM

## 2021-09-19 DIAGNOSIS — Z794 Long term (current) use of insulin: Secondary | ICD-10-CM | POA: Diagnosis not present

## 2021-09-19 DIAGNOSIS — E872 Acidosis, unspecified: Secondary | ICD-10-CM | POA: Diagnosis not present

## 2021-09-19 DIAGNOSIS — Z89412 Acquired absence of left great toe: Secondary | ICD-10-CM | POA: Diagnosis not present

## 2021-09-19 DIAGNOSIS — Z91148 Patient's other noncompliance with medication regimen for other reason: Secondary | ICD-10-CM | POA: Insufficient documentation

## 2021-09-19 DIAGNOSIS — F1910 Other psychoactive substance abuse, uncomplicated: Secondary | ICD-10-CM

## 2021-09-19 DIAGNOSIS — T671XXA Heat syncope, initial encounter: Secondary | ICD-10-CM

## 2021-09-19 DIAGNOSIS — N183 Chronic kidney disease, stage 3 unspecified: Secondary | ICD-10-CM | POA: Diagnosis present

## 2021-09-19 DIAGNOSIS — E1022 Type 1 diabetes mellitus with diabetic chronic kidney disease: Secondary | ICD-10-CM | POA: Insufficient documentation

## 2021-09-19 DIAGNOSIS — N1831 Chronic kidney disease, stage 3a: Secondary | ICD-10-CM

## 2021-09-19 DIAGNOSIS — E1065 Type 1 diabetes mellitus with hyperglycemia: Secondary | ICD-10-CM | POA: Diagnosis not present

## 2021-09-19 DIAGNOSIS — E039 Hypothyroidism, unspecified: Secondary | ICD-10-CM | POA: Insufficient documentation

## 2021-09-19 DIAGNOSIS — Z79899 Other long term (current) drug therapy: Secondary | ICD-10-CM | POA: Insufficient documentation

## 2021-09-19 DIAGNOSIS — R197 Diarrhea, unspecified: Secondary | ICD-10-CM

## 2021-09-19 LAB — BLOOD GAS, ARTERIAL
Acid-base deficit: 1.1 mmol/L (ref 0.0–2.0)
Bicarbonate: 22.4 mmol/L (ref 20.0–28.0)
Drawn by: 28338
O2 Saturation: 98.7 %
Patient temperature: 36.5
pCO2 arterial: 32 mmHg (ref 32–48)
pH, Arterial: 7.45 (ref 7.35–7.45)
pO2, Arterial: 93 mmHg (ref 83–108)

## 2021-09-19 LAB — CBC WITH DIFFERENTIAL/PLATELET
Abs Immature Granulocytes: 0.04 10*3/uL (ref 0.00–0.07)
Basophils Absolute: 0.1 10*3/uL (ref 0.0–0.1)
Basophils Relative: 1 %
Eosinophils Absolute: 0.2 10*3/uL (ref 0.0–0.5)
Eosinophils Relative: 1 %
HCT: 41.5 % (ref 39.0–52.0)
Hemoglobin: 14.7 g/dL (ref 13.0–17.0)
Immature Granulocytes: 0 %
Lymphocytes Relative: 41 %
Lymphs Abs: 5.2 10*3/uL — ABNORMAL HIGH (ref 0.7–4.0)
MCH: 30.2 pg (ref 26.0–34.0)
MCHC: 35.4 g/dL (ref 30.0–36.0)
MCV: 85.2 fL (ref 80.0–100.0)
Monocytes Absolute: 0.5 10*3/uL (ref 0.1–1.0)
Monocytes Relative: 4 %
Neutro Abs: 6.6 10*3/uL (ref 1.7–7.7)
Neutrophils Relative %: 53 %
Platelets: 173 10*3/uL (ref 150–400)
RBC: 4.87 MIL/uL (ref 4.22–5.81)
RDW: 12.2 % (ref 11.5–15.5)
WBC: 12.7 10*3/uL — ABNORMAL HIGH (ref 4.0–10.5)
nRBC: 0 % (ref 0.0–0.2)

## 2021-09-19 LAB — CBG MONITORING, ED
Glucose-Capillary: 203 mg/dL — ABNORMAL HIGH (ref 70–99)
Glucose-Capillary: 171 mg/dL — ABNORMAL HIGH (ref 70–99)

## 2021-09-19 LAB — LACTIC ACID, PLASMA
Lactic Acid, Venous: 1.5 mmol/L (ref 0.5–1.9)
Lactic Acid, Venous: 2.7 mmol/L (ref 0.5–1.9)

## 2021-09-19 LAB — BASIC METABOLIC PANEL
Anion gap: 16 — ABNORMAL HIGH (ref 5–15)
Anion gap: 14 (ref 5–15)
BUN: 26 mg/dL — ABNORMAL HIGH (ref 6–20)
BUN: 30 mg/dL — ABNORMAL HIGH (ref 6–20)
CO2: 20 mmol/L — ABNORMAL LOW (ref 22–32)
CO2: 20 mmol/L — ABNORMAL LOW (ref 22–32)
Calcium: 10 mg/dL (ref 8.9–10.3)
Calcium: 8.7 mg/dL — ABNORMAL LOW (ref 8.9–10.3)
Chloride: 101 mmol/L (ref 98–111)
Chloride: 101 mmol/L (ref 98–111)
Creatinine, Ser: 1.71 mg/dL — ABNORMAL HIGH (ref 0.61–1.24)
Creatinine, Ser: 1.77 mg/dL — ABNORMAL HIGH (ref 0.61–1.24)
GFR, Estimated: 51 mL/min — ABNORMAL LOW (ref >60)
GFR, Estimated: 49 mL/min — ABNORMAL LOW (ref >60)
Glucose, Bld: 198 mg/dL — ABNORMAL HIGH (ref 70–99)
Glucose, Bld: 437 mg/dL — ABNORMAL HIGH (ref 70–99)
Potassium: 4.9 mmol/L (ref 3.5–5.1)
Potassium: 3.8 mmol/L (ref 3.5–5.1)
Sodium: 137 mmol/L (ref 135–145)
Sodium: 135 mmol/L (ref 135–145)

## 2021-09-19 LAB — HEMOGLOBIN A1C
Hgb A1c MFr Bld: 11.1 % — ABNORMAL HIGH (ref 4.8–5.6)
Mean Plasma Glucose: 271.87 mg/dL

## 2021-09-19 LAB — TROPONIN I (HIGH SENSITIVITY)
Troponin I (High Sensitivity): 14 ng/L (ref <18)
Troponin I (High Sensitivity): 8 ng/L (ref <18)

## 2021-09-19 LAB — GLUCOSE, CAPILLARY
Glucose-Capillary: 226 mg/dL — ABNORMAL HIGH (ref 70–99)
Glucose-Capillary: 545 mg/dL (ref 70–99)
Glucose-Capillary: 426 mg/dL — ABNORMAL HIGH (ref 70–99)

## 2021-09-19 LAB — BRAIN NATRIURETIC PEPTIDE: B Natriuretic Peptide: 51.2 pg/mL (ref 0.0–100.0)

## 2021-09-19 LAB — BETA-HYDROXYBUTYRIC ACID: Beta-Hydroxybutyric Acid: 0.26 mmol/L (ref 0.05–0.27)

## 2021-09-19 SURGERY — CORONARY/GRAFT ACUTE MI REVASCULARIZATION
Anesthesia: LOCAL

## 2021-09-19 MED ORDER — ADULT MULTIVITAMIN W/MINERALS CH
1.0000 | ORAL_TABLET | Freq: Every day | ORAL | Status: DC
  Administered 2021-09-19 – 2021-09-20 (×2): 1 via ORAL
  Filled 2021-09-19 (×2): qty 1

## 2021-09-19 MED ORDER — ENSURE ENLIVE PO LIQD
237.0000 mL | Freq: Two times a day (BID) | ORAL | Status: DC
Start: 2021-09-20 — End: 2021-09-20
  Administered 2021-09-20: 237 mL via ORAL

## 2021-09-19 MED ORDER — THIAMINE HCL 100 MG PO TABS
100.0000 mg | ORAL_TABLET | Freq: Every day | ORAL | Status: DC
Start: 2021-09-19 — End: 2021-09-20
  Administered 2021-09-19 – 2021-09-20 (×2): 100 mg via ORAL
  Filled 2021-09-19 (×2): qty 1

## 2021-09-19 MED ORDER — ORAL CARE MOUTH RINSE: 15.0000 mL | OROMUCOSAL | Status: DC | PRN

## 2021-09-19 MED ORDER — INSULIN GLARGINE-YFGN 100 UNIT/ML ~~LOC~~ SOLN
5.0000 [IU] | Freq: Every day | SUBCUTANEOUS | Status: DC
  Filled 2021-09-19: qty 0.05

## 2021-09-19 MED ORDER — INSULIN ASPART 100 UNIT/ML IJ SOLN
0.0000 [IU] | INTRAMUSCULAR | Status: DC
  Administered 2021-09-19: 3 [IU] via SUBCUTANEOUS

## 2021-09-19 MED ORDER — SODIUM CHLORIDE 0.9 % IV BOLUS
1000.0000 mL | Freq: Once | INTRAVENOUS | Status: AC
Start: 2021-09-19 — End: 2021-09-19
  Administered 2021-09-19: 1000 mL via INTRAVENOUS

## 2021-09-19 MED ORDER — INSULIN GLARGINE-YFGN 100 UNIT/ML ~~LOC~~ SOLN
15.0000 [IU] | Freq: Two times a day (BID) | SUBCUTANEOUS | Status: DC
Start: 2021-09-19 — End: 2021-09-20
  Administered 2021-09-19 – 2021-09-20 (×2): 15 [IU] via SUBCUTANEOUS
  Filled 2021-09-19 (×3): qty 0.15

## 2021-09-19 MED ORDER — INSULIN ASPART 100 UNIT/ML IJ SOLN
0.0000 [IU] | INTRAMUSCULAR | Status: DC
  Administered 2021-09-19: 15 [IU] via SUBCUTANEOUS
  Administered 2021-09-20: 2 [IU] via SUBCUTANEOUS
  Administered 2021-09-20: 15 [IU] via SUBCUTANEOUS
  Administered 2021-09-20: 11 [IU] via SUBCUTANEOUS

## 2021-09-19 MED ORDER — FOLIC ACID 1 MG PO TABS
1.0000 mg | ORAL_TABLET | Freq: Every day | ORAL | Status: DC
  Administered 2021-09-19 – 2021-09-20 (×2): 1 mg via ORAL
  Filled 2021-09-19 (×2): qty 1

## 2021-09-19 MED ORDER — HEPARIN SODIUM (PORCINE) 5000 UNIT/ML IJ SOLN
5000.0000 [IU] | Freq: Three times a day (TID) | INTRAMUSCULAR | Status: DC
  Administered 2021-09-19 – 2021-09-20 (×3): 5000 [IU] via SUBCUTANEOUS
  Filled 2021-09-19 (×3): qty 1

## 2021-09-19 MED ORDER — LACTATED RINGERS IV SOLN: INTRAVENOUS | Status: DC

## 2021-09-19 NOTE — Progress Notes (Signed)
No STEMI Patient seen by [facility]. Please contact them if necessary.   Vernell Leep, MD

## 2021-09-19 NOTE — ED Provider Notes (Signed)
[facility] EMERGENCY DEPARTMENT Provider Note   CSN: [id number] Arrival date & time: 09/19/21  1241     History  Chief Complaint  Patient presents with   Chest Pain   Code STEMI    Luke [name] is a 41 y.o. male.  41 year old male with prior medical history as detailed below presents for evaluation.  Patient reports that he was sitting on his porch and then stood.  As he stood he became lightheaded and almost passed out.  He did not pass out.  He denies chest pain.  He reports that he did " smoke a blunt" approximately 30 minutes prior to his near syncopal episode.  He denies chest pain.  He denies shortness of breath.  He reports that he has been " withdrawing from Suboxone" for the last week.  He reports significant diarrhea.  She denies any nausea or vomiting.  She denies fevers.  He denies chest pain or shortness of breath.  Patient drug use other than marijuana in the last several weeks.  During transport EMS noted potentially abnormal EKG.  Code STEMI was initiated.  Patient seen on arrival by Dr. Virgina Jock who cancels STEMI.  Medical history significant for bacterial endocarditis, polysubstance use, anemia, CKD 3, type 1 diabetes, hypertension, GERD, hepatitis C, hypothyroidism, anxiety.  The history is provided by the patient and medical records.  Illness Location:  Near syncope, diarrhea Severity:  Mild Onset quality:  Sudden Duration:  1 hour Timing:  Constant Progression:  Unchanged Chronicity:  New      Home Medications Prior to Admission medications   Medication Sig Start Date End Date Taking? Authorizing Provider  Accu-Chek Softclix Lancets lancets use 1 lancet to check blood glucose 5 times per day 05/01/21     amLODipine (NORVASC) 10 MG tablet Take 1 tablet (10 mg total) by mouth daily. 05/01/21 07/30/21  Gildardo Pounds, NP  Blood Glucose Monitoring Suppl (ACCU-CHEK GUIDE ME) w/Device KIT Use to check blood sugar TID. E11.65 Patient not  taking: Reported on 05/01/2021 01/14/20   Charlott Rakes, MD  carvedilol (COREG) 12.5 MG tablet Take 1 tablet (12.5 mg total) by mouth 2 (two) times daily with a meal. 05/01/21 07/30/21  Gildardo Pounds, NP  Continuous Blood Gluc Receiver (FREESTYLE LIBRE 14 DAY READER) DEVI Use as instructed. Check blood glucose level by fingerstick 5 times per day. E11.65 Z79.4 Patient not taking: Reported on 05/01/2021 01/27/21   Gildardo Pounds, NP  Continuous Blood Gluc Receiver (FREESTYLE LIBRE 2 READER) DEVI Use as instructed. Check blood glucose level by fingerstick 5 times per day. E11.65 Z79.4 Patient not taking: Reported on 05/01/2021 01/27/21   Gildardo Pounds, NP  Continuous Blood Gluc Sensor (FREESTYLE LIBRE 2 SENSOR) MISC Use as instructed. Check blood glucose level 5 times per day. 08/18/21   Charlott Rakes, MD  glucose blood test strip use 1 test strip to check blood glucose 5 times per day 05/01/21   Gildardo Pounds, NP  insulin aspart (NOVOLOG) 100 UNIT/ML FlexPen Inject 2-10 Units into the skin 3 (three) times daily with meals. Glucose 151-200 take 2 units, glucose 201-250 take 4 units, glucose 251-300 take 6 units, glucose 301-350 take 8 units, glucose 351-400 take 10 units 10/10/20 01/20/21  [place], DO  insulin aspart protamine - aspart (NOVOLOG MIX 70/30 FLEXPEN) (70-30) 100 UNIT/ML FlexPen Inject 45 Units into the skin 2 (two) times daily with a meal. 05/01/21 09/22/21  Gildardo Pounds, NP  Insulin  Pen Needle (TRUEPLUS 5-BEVEL PEN NEEDLES) 32G X 4 MM MISC Use as instructed to inject insulin. 11/27/19   Charlott Rakes, MD  ketoconazole (NIZORAL) 2 % cream Apply 1 application topically daily. 05/15/21   McDonald, Stephan Minister, DPM  lisinopril (ZESTRIL) 5 MG tablet Take 1 tablet (5 mg total) by mouth daily. 05/01/21   Gildardo Pounds, NP  sildenafil (VIAGRA) 100 MG tablet Take 0.5-1 tablets (50-100 mg total) by mouth daily as needed for erectile dysfunction. 05/01/21   Gildardo Pounds, NP  SUBOXONE 8-2  MG FILM Take 1 Film by mouth 3 (three) times daily. Patient not taking: Reported on 05/01/2021 07/14/20   [provider]      Allergies    Patient has no known allergies.    Review of Systems   Review of Systems  All other systems reviewed and are negative.   Physical Exam Updated Vital Signs Ht $Remove'5\' 9"'bUnhDKI$  (1.753 m)   Wt 72.6 kg   BMI 23.63 kg/m  Physical Exam Vitals and nursing note reviewed.  Constitutional:      General: He is not in acute distress.    Appearance: Normal appearance. He is well-developed.  HENT:     Head: Normocephalic and atraumatic.  Eyes:     Conjunctiva/sclera: Conjunctivae normal.     Pupils: Pupils are equal, round, and reactive to light.  Cardiovascular:     Rate and Rhythm: Normal rate and regular rhythm.     Heart sounds: Normal heart sounds.  Pulmonary:     Effort: Pulmonary effort is normal. No respiratory distress.     Breath sounds: Normal breath sounds.  Abdominal:     General: There is no distension.     Palpations: Abdomen is soft.     Tenderness: There is no abdominal tenderness.  Musculoskeletal:        General: No deformity. Normal range of motion.     Cervical back: Normal range of motion and neck supple.  Skin:    General: Skin is warm and dry.  Neurological:     General: No focal deficit present.     Mental Status: He is alert and oriented to person, place, and time.     ED Results / Procedures / Treatments   Labs (all labs ordered are listed, but only abnormal results are displayed) Labs Reviewed  CBG MONITORING, ED - Abnormal; Notable for the following components:      Result Value   Glucose-Capillary 203 (*)    All other components within normal limits  CBC WITH DIFFERENTIAL/PLATELET  BASIC METABOLIC PANEL  URINALYSIS, ROUTINE W REFLEX MICROSCOPIC  RAPID URINE DRUG SCREEN, HOSP PERFORMED  BRAIN NATRIURETIC PEPTIDE  TROPONIN I (HIGH SENSITIVITY)    EKG None  Radiology No results  found.  Procedures Procedures    Medications Ordered in ED Medications  sodium chloride 0.9 % bolus 1,000 mL (has no administration in time range)    ED Course/ Medical Decision Making/ A&P                           Medical Decision Making Amount and/or Complexity of Data Reviewed Labs: ordered. Radiology: ordered.  Risk Decision regarding hospitalization.    Medical Screen Complete  This patient presented to the ED with complaint of near syncope.  This complaint involves an extensive number of treatment options. The initial differential diagnosis includes, but is not limited to, AKI, metabolic abnormality, etc.  This presentation is:  Acute, Self-Limited, Previously Undiagnosed, Uncertain Prognosis, Complicated, Systemic Symptoms, and Threat to Life/Bodily Function  Patient presents with near syncopal episode.  Patient reports significant diarrhea over the last several days after apparently stopping Suboxone use.  Patient denies other recent drug use outside of marijuana.  Bleeding, patient was called as a possible STEMI by the transporting EMS crew.  STEMI was canceled by Dr. Virgina Jock upon arrival to the ED.  Work-up demonstrates evidence of likely dehydration with elevated BUN and creatinine.  Patient would likely benefit from admission for further work-up and treatment.    Hospitalist service made aware of case and will evaluate for admission.    Co morbidities that complicated the patient's evaluation  History of bacterial endocarditis, polysubstance abuse, CKD, type 1 diabetes, hypertension   Additional history obtained:  Additional history obtained from EMS External records from outside sources obtained and reviewed including prior ED visits and prior Inpatient records.    Lab Tests:  I ordered and personally interpreted labs.  The pertinent results include: CBC, BMP, troponin, BNP, CBG, UA, urine tox   Imaging Studies ordered:  I ordered  imaging studies including chest x-ray I independently visualized and interpreted obtained imaging which showed NAD I agree with the radiologist interpretation.   Cardiac Monitoring:  The patient was maintained on a cardiac monitor.  I personally viewed and interpreted the cardiac monitor which showed an underlying rhythm of: NSR  Problem List / ED Course:  Near syncope, dehydration, AKI   Reevaluation:  After the interventions noted above, I reevaluated the patient and found that they have: improved   Disposition:  After consideration of the diagnostic results and the patients response to treatment, I feel that the patent would benefit from admission.          Final Clinical Impression(s) / ED Diagnoses Final diagnoses:  Near syncope  AKI (acute kidney injury) [facility])    Rx / DC Orders ED Discharge Orders     None         Valarie Merino, MD 09/19/21 1514

## 2021-09-19 NOTE — ED Triage Notes (Signed)
Pt BIB [facility] from home d/t syncopal episode when he stood, EMS sent EKG to [facility] was activated pt denies CP the whole time while en route. A/Ox4, denies any cardiac Hx but does have family Hx of same. Pt reports that he is currently having withdraws from suboxone that he last took last Monday (09/11/21). No PIV upon arrival, IV drug use Hx, difficult stick.

## 2021-09-19 NOTE — H&P (Signed)
History and Physical    Luke [name] FCV:[id number] DOB: 06-01-1980 DOA: 09/19/2021  PCP: Claiborne Rigg, NP  Patient coming from: Home   Chief Complaint: Syncope  HPI: Luke [name] is a 41 y.o. male with medical history significant of diabetes mellitus type 1 on insulin, endocarditis, polysubstance abuse, hypertension, chronic kidney disease CKD stage III A presents with syncope. Patient reports having several episodes of diarrhea yesterday, and also had few shots of alcohol. Today was sitting outside on the patio for about an hour, and suddenly wokeup  and found himself on the ground. He doesn't remember falling on floor and believes he passed out. His His wife came to help him and called EMS.  He reports eating fish yesterday and afterwards started having diarrhea. No one sick nor no sick contacts. He was recently in detox for 4 days and was released couple of days ago. Reports doing marijuana for the past week but denies any other drug use.  His diarrhea has resolved.  When EMS was called they thought ekg was like STEMI, however patient without any chest pain or sob. Cardiology reviewed EKG here and stated no STEMI.   Patient denies  sob, lightheadedness, dizziness, abd pain, cp, n/v.    Patient told ER he felt he was going through withdrawal from Suboxone as his last dose was last week.  However, he was in detox from 09/12/21 to 09/15/21 when he presented to detox requesting detox from heroin and cocaine. I did not see any ordered prescriptions for Suboxone.  I only saw 1 order mirtazapine 15mg  qd 1 month supply at discharge from detox.     ED Course: Blood pressure 95/65, respiratory rate 23, pulse 96, satting 100% on room air glucose 198, CO2 20, BUN 26, creatinine 1.71, anion gap 16, chloride 101, potassium 4.9, sodium 137, BNP 51, troponin 14, WBC 12.7, hemoglobin 14.7, hematocrit 41.5, platelets 173 Cxr personally reviewed, negative.   Review of Systems: All systems reviewed and  otherwise negative.    Past Medical History:  Diagnosis Date   Anemia    Anxiety  Dx 2008   Cellulitis    Chronic kidney disease    Diabetes mellitus without complication ([name]) Dx 2008   DKA (diabetic ketoacidoses) 07/21/2017   Endocarditis 04/2019   Endocarditis    GERD (gastroesophageal reflux disease) Dx 2008   Headache(784.0)    Heart murmur    Hepatitis C    Hypertension    Hypothyroidism    MRSA infection    Necrotizing fasciitis of multiple sites ([name])    Pneumonia    Seizures ([name]) 2011   x 2 in lifetime. on Dilantin for a while.    Sepsis ([name])    Substance abuse ([name]) 2013   heroin use, multiple relapses   Type 1 diabetes [facility])     Past Surgical History:  Procedure Laterality Date   AMPUTATION Left 09/05/2018   Procedure: LEFT GREAT TOE AMPUTATION;  Surgeon: 09/07/2018, MD;  [facility] OR;  Service: Orthopedics;  Laterality: Left;   APPLICATION OF A-CELL OF EXTREMITY Bilateral 09/01/2019   Procedure: APPLICATION OF MESHED PRIMATRIX AG OF EXTREMITY;  Surgeon: 09/03/2019, MD;  Location: [facility] OR;  Service: Plastics;  Laterality: Bilateral;   I & D EXTREMITY Left 10/11/2012   Procedure: IRRIGATION AND DEBRIDEMENT ABSCESS FOREARM;  Surgeon: 10/13/2012, MD;  Location: [facility] OR;  Service: Orthopedics;  Laterality: Left;   I & D EXTREMITY Left 10/12/2012  Procedure: IRRIGATION AND DEBRIDEMENT FOREARM;  Surgeon: Linna Hoff, MD;  Location: [place];  Service: Orthopedics;  Laterality: Left;   I & D EXTREMITY Left 10/14/2012   Procedure: incision and drainage left forearm;  Surgeon: Linna Hoff, MD;  Location: [place];  Service: Orthopedics;  Laterality: Left;   I & D EXTREMITY Left 10/16/2012   Procedure: IRRIGATION AND DEBRIDEMENT LEFT FOREARM;  Surgeon: Linna Hoff, MD;  Location: [place];  Service: Orthopedics;  Laterality: Left;   I & D EXTREMITY Left 10/20/2012   Procedure: INCISION AND DRAINAGE AND DEBRIDEMENT LEFT  FOREARM;  Surgeon: Linna Hoff, MD;   Location: [place];  Service: Orthopedics;  Laterality: Left;   I & D EXTREMITY Right 05/22/2019   Procedure: IRRIGATION AND DEBRIDEMENT RIGHT ARM;  Surgeon: [name], MD;  Location: [place];  Service: Orthopedics;  Laterality: Right;   INCISION AND DRAINAGE OF WOUND Bilateral 11/29/2017   Procedure: DEBRIDEMENT BILATERAL FEET, DEBRIDEMENT LEFT ANKLE, AND APPLY WOUND VAC;  Surgeon: [name], MD;  Location: [facility];  Service: Orthopedics;  Laterality: Bilateral;   INCISION AND DRAINAGE OF WOUND Bilateral 09/01/2019   Procedure: IRRIGATION AND DEBRIDEMENT FOREARM WOUNDS;  Surgeon: Cindra Presume, MD;  Location: [facility];  Service: Plastics;  Laterality: Bilateral;   IRRIGATION AND DEBRIDEMENT ABSCESS     Hx: of left arm abscess related to drug use    TEE WITHOUT CARDIOVERSION N/A 11/28/2017   Procedure: TRANSESOPHAGEAL ECHOCARDIOGRAM (TEE);  Surgeon: Sanda Klein, MD;  Location: [place];  Service: Cardiovascular;  Laterality: N/A;   TEE WITHOUT CARDIOVERSION N/A 05/12/2019   Procedure: TRANSESOPHAGEAL ECHOCARDIOGRAM (TEE);  Surgeon: Skeet Latch, MD;  Location: [facility] ENDOSCOPY;  Service: Cardiovascular;  Laterality: N/A;     reports that he has been smoking cigarettes. He has a 25.00 pack-year smoking history. He has never used smokeless tobacco. He reports that he does not currently use drugs after having used the following drugs: Cocaine, IV, and Heroin. He reports that he does not drink alcohol.  No Known Allergies  Family History  Problem Relation Age of Onset   Diabetes Father    Heart disease Father    Mental illness Sister    Cancer Neg Hx      Prior to Admission medications   Medication Sig Start Date End Date Taking? Authorizing Provider  insulin aspart protamine - aspart (NOVOLOG MIX 70/30 FLEXPEN) (70-30) 100 UNIT/ML FlexPen Inject 45 Units into the skin 2 (two) times daily with a meal. 05/01/21 09/22/21 Yes Gildardo Pounds, NP  Accu-Chek Softclix Lancets lancets use 1  lancet to check blood glucose 5 times per day 05/01/21     amLODipine (NORVASC) 10 MG tablet Take 1 tablet (10 mg total) by mouth daily. 05/01/21 07/30/21  Gildardo Pounds, NP  Blood Glucose Monitoring Suppl (ACCU-CHEK GUIDE ME) w/Device KIT Use to check blood sugar TID. E11.65 Patient not taking: Reported on 05/01/2021 01/14/20   Charlott Rakes, MD  carvedilol (COREG) 12.5 MG tablet Take 1 tablet (12.5 mg total) by mouth 2 (two) times daily with a meal. 05/01/21 07/30/21  Gildardo Pounds, NP  Continuous Blood Gluc Receiver (FREESTYLE LIBRE 14 DAY READER) DEVI Use as instructed. Check blood glucose level by fingerstick 5 times per day. E11.65 Z79.4 Patient not taking: Reported on 05/01/2021 01/27/21   Gildardo Pounds, NP  Continuous Blood Gluc Receiver (FREESTYLE LIBRE 2 READER) DEVI Use as instructed. Check blood glucose level by fingerstick 5 times  per day. E11.65 Z79.4 Patient not taking: Reported on 05/01/2021 01/27/21   Gildardo Pounds, NP  Continuous Blood Gluc Sensor (FREESTYLE LIBRE 2 SENSOR) MISC Use as instructed. Check blood glucose level 5 times per day. 08/18/21   Charlott Rakes, MD  glucose blood test strip use 1 test strip to check blood glucose 5 times per day 05/01/21   Gildardo Pounds, NP  insulin aspart (NOVOLOG) 100 UNIT/ML FlexPen Inject 2-10 Units into the skin 3 (three) times daily with meals. Glucose 151-200 take 2 units, glucose 201-250 take 4 units, glucose 251-300 take 6 units, glucose 301-350 take 8 units, glucose 351-400 take 10 units 10/10/20 01/20/21  [place] J, DO  Insulin Pen Needle (TRUEPLUS 5-BEVEL PEN NEEDLES) 32G X 4 MM MISC Use as instructed to inject insulin. 11/27/19   Charlott Rakes, MD  ketoconazole (NIZORAL) 2 % cream Apply 1 application topically daily. 05/15/21   McDonald, Stephan Minister, DPM  lisinopril (ZESTRIL) 5 MG tablet Take 1 tablet (5 mg total) by mouth daily. 05/01/21   Gildardo Pounds, NP  sildenafil (VIAGRA) 100 MG tablet Take 0.5-1 tablets (50-100 mg  total) by mouth daily as needed for erectile dysfunction. 05/01/21   Gildardo Pounds, NP  SUBOXONE 8-2 MG FILM Take 1 Film by mouth 3 (three) times daily. Patient not taking: Reported on 05/01/2021 07/14/20   [provider]    Physical Exam: Vitals:   09/19/21 1515 09/19/21 1530 09/19/21 1545 09/19/21 1600  BP: 93/71 102/70 101/70 101/67  Pulse: 90 84 84 86  Resp: 16 15  (!) 21  SpO2: 97% 99% 99% 98%  Weight:      Height:        Constitutional: NAD, calm, comfortable Vitals:   09/19/21 1515 09/19/21 1530 09/19/21 1545 09/19/21 1600  BP: 93/71 102/70 101/70 101/67  Pulse: 90 84 84 86  Resp: 16 15  (!) 21  SpO2: 97% 99% 99% 98%  Weight:      Height:       Eyes: PERRL, lids and conjunctivae normal ENMT: Mucous membranes are Dry.Posterior pharynx clear of any exudate or lesions.Normal dentition.  Neck: normal, supple, no masses, no thyromegaly Respiratory: clear to auscultation bilaterally, no wheezing, no crackles. Normal respiratory effort. No accessory muscle use.  Cardiovascular: Regular rate and rhythm, 3/6 holosystolic murmur. No rubs / gallops. No extremity edema. 2+ pedal pulses. No carotid bruits.  Abdomen: no tenderness, no masses palpated. No hepatosplenomegaly. Bowel sounds positive.  Musculoskeletal: no clubbing / cyanosis. . Good ROM, no contractures. Normal muscle tone.  Skin:warm, dry Neurologic: CN 2-12 grossly intact. Sensation intact, Strength 5/5 in all 4.  Psychiatric: Normal judgment and insight. Alert and oriented x 3. Normal mood.    Labs on Admission: I have personally reviewed following labs and imaging studies  CBC: Recent Labs  Lab 09/19/21 1255  WBC 12.7*  NEUTROABS 6.6  HGB 14.7  HCT 41.5  MCV 85.2  PLT 863   Basic Metabolic Panel: Recent Labs  Lab 09/19/21 1255  NA 137  K 4.9  CL 101  CO2 20*  GLUCOSE 198*  BUN 26*  CREATININE 1.71*  CALCIUM 10.0   GFR: Estimated Creatinine Clearance: 56.8 mL/min (A) (by C-G formula  based on SCr of 1.71 mg/dL (H)). Liver Function Tests: No results for input(s): "AST", "ALT", "ALKPHOS", "BILITOT", "PROT", "ALBUMIN" in the last 168 hours. No results for input(s): "LIPASE", "AMYLASE" in the last 168 hours. No results for input(s): "AMMONIA" in the last  168 hours. Coagulation Profile: No results for input(s): "INR", "PROTIME" in the last 168 hours. Cardiac Enzymes: No results for input(s): "CKTOTAL", "CKMB", "CKMBINDEX", "TROPONINI" in the last 168 hours. BNP (last 3 results) No results for input(s): "PROBNP" in the last 8760 hours. HbA1C: No results for input(s): "HGBA1C" in the last 72 hours. CBG: Recent Labs  Lab 09/19/21 1259 09/19/21 1602  GLUCAP 203* 171*   Lipid Profile: No results for input(s): "CHOL", "HDL", "LDLCALC", "TRIG", "CHOLHDL", "LDLDIRECT" in the last 72 hours. Thyroid Function Tests: No results for input(s): "TSH", "T4TOTAL", "FREET4", "T3FREE", "THYROIDAB" in the last 72 hours. Anemia Panel: No results for input(s): "VITAMINB12", "FOLATE", "FERRITIN", "TIBC", "IRON", "RETICCTPCT" in the last 72 hours. Urine analysis:    Component Value Date/Time   COLORURINE YELLOW 01/19/2021 1726   APPEARANCEUR CLEAR 01/19/2021 1726   APPEARANCEUR Clear 04/11/2014 0138   LABSPEC 1.021 01/19/2021 1726   LABSPEC 1.026 04/11/2014 0138   PHURINE 5.0 01/19/2021 1726   GLUCOSEU 50 (A) 01/19/2021 1726   GLUCOSEU >=500 04/11/2014 0138   HGBUR SMALL (A) 01/19/2021 1726   BILIRUBINUR negative 05/01/2021 1629   BILIRUBINUR neg 02/18/2017 0946   BILIRUBINUR Negative 04/11/2014 0138   KETONESUR negative 05/01/2021 1629   KETONESUR 5 (A) 01/19/2021 1726   PROTEINUR 100 (A) 01/19/2021 1726   UROBILINOGEN 0.2 05/01/2021 1629   UROBILINOGEN 0.2 12/10/2013 1221   NITRITE Negative 05/01/2021 1629   NITRITE NEGATIVE 01/19/2021 1726   LEUKOCYTESUR Negative 05/01/2021 1629   LEUKOCYTESUR NEGATIVE 01/19/2021 1726   LEUKOCYTESUR Negative 04/11/2014 0138     Radiological Exams on Admission: DG Chest Port 1 View  Result Date: 09/19/2021 CLINICAL DATA:  Weakness.  Syncopal. EXAM: PORTABLE CHEST 1 VIEW COMPARISON:  Chest radiograph 01/20/2021 and earlier. FINDINGS: The heart size and mediastinal contours are within normal limits. Both lungs are clear. No pleural effusion or pneumothorax. The visualized skeletal structures are unremarkable. IMPRESSION: No acute cardiopulmonary abnormality. Electronically Signed   By: Ileana Roup M.D.   On: 09/19/2021 13:19    EKG: Independently reviewed. SR, early repolarization. NO ischemic st/t changes  Assessment/Plan Principal Problem:   Syncope Active Problems:   Polysubstance abuse ([name])   Essential hypertension   CKD (chronic kidney disease) stage 3, GFR 30-59 ml/min ([name])   Leukocytosis   AKI (acute kidney injury) ([name])   High anion gap metabolic acidosis   Diarrhea   Uncontrolled type 1 diabetes mellitus with hyperglycemia, with long-term current use of insulin ([place])   1.syncope Likely 2/2 dehydration/hypovolemic Patient with diarrhea, hypotensive on presentation and AKI Cardiology ruled out STEMI. TP negative,without chest pain   2. DM I uncontrolled with hyperglycemia  On insulin Last A1c elevated, ck A1c Diabetic diet RISS Once able to eat , start standing dose lantus. Did take his regular home dose insulin last night     3. HTN On presentation was hypotensive. Hold all bp meds Give IVF   4. AKI on CKD stage IIIa AKI likely from hypovolemia, dehydration in setting of diarrhea and elevated bun. Start IVF with LR Monitor Avoid nephrotoxic meds  5. Diarrhea After eating fish. Had several episodes yesterday . Reports none today Will ck gi panel if continues to have diarrhea   6. Polysubstance abuse Was released from detox last month after presenting for detox for cocaine and heroin. Drug urine toxicology pending Avoid pain meds Was not prescribed suboxone on  discharge  7.leukocytosis Chronically elevated Wbc less than previous Afebrile  Monitor  8. AG metabolic acidosis Possibly  due to dehydration.  Rule out other causes Will Ck lactic acid, ABG, and beta-hydroxybutyrate. Monitor AG   DVT prophylaxis: heparin  Code Status: full  Family Communication: none at bedside  Disposition Plan: home  Consults called: cardiology cleared patient for STEMI  Admission status: observation as patient requires <2 MN stays.    Nolberto Hanlon MD [facility] Pager [phone]   If 7PM-7AM, please contact night-coverage [email or web address] Password [facility]  09/19/2021, 4:48 PM

## 2021-09-19 NOTE — Progress Notes (Signed)
Patient transferred from ED at 1711hrs. Oriented to room and plan of care for shift.  Patient placed on enteric precautions due to order for GI panel.  Patient notified of need for urine and stool sample. Patient verbalized understanding.

## 2021-09-19 NOTE — Progress Notes (Addendum)
Blood glucose per lab draw at 1807hrs 437.  FSBG done, 545.  Patient ate late lunch, and 100% of dinner.  Dr. Kurtis Bushman notified. Orders written.

## 2021-09-20 ENCOUNTER — Other Ambulatory Visit: Payer: Self-pay | Admitting: Cardiology

## 2021-09-20 DIAGNOSIS — T671XXA Heat syncope, initial encounter: Secondary | ICD-10-CM

## 2021-09-20 DIAGNOSIS — N179 Acute kidney failure, unspecified: Secondary | ICD-10-CM | POA: Diagnosis not present

## 2021-09-20 DIAGNOSIS — E8729 Other acidosis: Secondary | ICD-10-CM | POA: Diagnosis not present

## 2021-09-20 DIAGNOSIS — R55 Syncope and collapse: Principal | ICD-10-CM

## 2021-09-20 DIAGNOSIS — F1910 Other psychoactive substance abuse, uncomplicated: Secondary | ICD-10-CM | POA: Diagnosis not present

## 2021-09-20 LAB — COMPREHENSIVE METABOLIC PANEL
ALT: 42 U/L (ref 0–44)
AST: 41 U/L (ref 15–41)
Albumin: 3.1 g/dL — ABNORMAL LOW (ref 3.5–5.0)
Alkaline Phosphatase: 103 U/L (ref 38–126)
Anion gap: 9 (ref 5–15)
BUN: 22 mg/dL — ABNORMAL HIGH (ref 6–20)
CO2: 21 mmol/L — ABNORMAL LOW (ref 22–32)
Calcium: 8.4 mg/dL — ABNORMAL LOW (ref 8.9–10.3)
Chloride: 104 mmol/L (ref 98–111)
Creatinine, Ser: 1.41 mg/dL — ABNORMAL HIGH (ref 0.61–1.24)
GFR, Estimated: >60 mL/min (ref >60)
Glucose, Bld: 359 mg/dL — ABNORMAL HIGH (ref 70–99)
Potassium: 4.6 mmol/L (ref 3.5–5.1)
Sodium: 134 mmol/L — ABNORMAL LOW (ref 135–145)
Total Bilirubin: 0.4 mg/dL (ref 0.3–1.2)
Total Protein: 5.4 g/dL — ABNORMAL LOW (ref 6.5–8.1)

## 2021-09-20 LAB — URINALYSIS, ROUTINE W REFLEX MICROSCOPIC
Bacteria, UA: NONE SEEN
Bilirubin Urine: NEGATIVE
Glucose, UA: >=500 mg/dL — AB
Hgb urine dipstick: NEGATIVE
Ketones, ur: NEGATIVE mg/dL
Leukocytes,Ua: NEGATIVE
Nitrite: NEGATIVE
Protein, ur: 30 mg/dL — AB
Specific Gravity, Urine: 1.018 (ref 1.005–1.030)
pH: 6 (ref 5.0–8.0)

## 2021-09-20 LAB — GLUCOSE, CAPILLARY
Glucose-Capillary: 134 mg/dL — ABNORMAL HIGH (ref 70–99)
Glucose-Capillary: 161 mg/dL — ABNORMAL HIGH (ref 70–99)
Glucose-Capillary: 309 mg/dL — ABNORMAL HIGH (ref 70–99)
Glucose-Capillary: 369 mg/dL — ABNORMAL HIGH (ref 70–99)
Glucose-Capillary: 70 mg/dL (ref 70–99)

## 2021-09-20 LAB — MAGNESIUM: Magnesium: 1.9 mg/dL (ref 1.7–2.4)

## 2021-09-20 LAB — RAPID URINE DRUG SCREEN, HOSP PERFORMED
Amphetamines: NOT DETECTED
Barbiturates: NOT DETECTED
Benzodiazepines: NOT DETECTED
Cocaine: POSITIVE — AB
Opiates: NOT DETECTED
Tetrahydrocannabinol: POSITIVE — AB

## 2021-09-20 LAB — LACTIC ACID, PLASMA: Lactic Acid, Venous: 2.1 mmol/L (ref 0.5–1.9)

## 2021-09-20 MED ORDER — INSULIN ASPART 100 UNIT/ML IJ SOLN: 4.0000 [IU] | Freq: Three times a day (TID) | INTRAMUSCULAR | Status: DC

## 2021-09-20 MED ORDER — ACETAMINOPHEN 325 MG PO TABS: 650.0000 mg | ORAL_TABLET | Freq: Four times a day (QID) | ORAL | Status: DC | PRN

## 2021-09-20 NOTE — Discharge Summary (Signed)
Discharge Summary  Luke [name] BOF:[id number] DOB: [birth year]  PCP: Luke Pounds, NP  Admit date: 09/19/2021 Discharge date: 09/20/2021  Time spent:  22mins  Recommendations for Outpatient Follow-up:  F/u with PCP within a week  for hospital discharge follow up, repeat cbc/bmp at follow up next week, message sent to pcp through epic inbasket  F/u with cardiology for cardiac murmur ( reports chronic), for outpatient cardiac monitor, message sent to cards master through epic inbasket F/u with endocrinology Dr Luke [name] as already scheduled  Ambulatory referral to diabetes education made       Discharge Diagnoses:  Active Hospital Problems   Diagnosis Date Noted   Syncope 09/19/2021   High anion gap metabolic acidosis 77/82/4235   Diarrhea 09/19/2021   Uncontrolled type 1 diabetes mellitus with hyperglycemia, with long-term current use of insulin ([place]) 09/19/2021   AKI (acute kidney injury) ([place]) 12/03/2014   CKD (chronic kidney disease) stage 3, GFR 30-59 ml/min ([place]) 10/14/2012   Leukocytosis 10/14/2012   Essential hypertension 10/11/2012   Polysubstance abuse ([place]) 10/11/2012    Resolved Hospital Problems  No resolved problems to display.    Discharge Condition: stable  Diet recommendation: heart healthy/carb modified  Filed Weights   09/19/21 1200 09/19/21 1711  Weight: 72.6 kg 73.4 kg    History of present illness: per admitting MD Dr Luke [name])  Luke [name] is a 41 y.o. male with medical history significant of diabetes mellitus type 1 on insulin, endocarditis, polysubstance abuse, hypertension, chronic kidney disease CKD stage III A presents with syncope. Patient reports having several episodes of diarrhea yesterday, and also had few shots of alcohol. Today was sitting outside on the patio for about an hour, and suddenly wokeup  and found himself on the ground. He doesn't remember falling on floor and believes he passed out. His His wife came to help him and called  EMS.  He reports eating fish yesterday and afterwards started having diarrhea. No one sick nor no sick contacts. He was recently in detox for 4 days and was released couple of days ago. Reports doing marijuana for the past week but denies any other drug use.   His diarrhea has resolved.   When EMS was called they thought ekg was like STEMI, however patient without any chest pain or sob. Cardiology reviewed EKG here and stated no STEMI.    Patient denies  sob, lightheadedness, dizziness, abd pain, cp, n/v.       Patient told ER he felt he was going through withdrawal from Suboxone as his last dose was last week.  However, he was in detox from 09/12/21 to 09/15/21 when he presented to detox requesting detox from heroin and cocaine. I did not see any ordered prescriptions for Suboxone.  I only saw 1 order mirtazapine $RemoveBeforeDEI'15mg'LMVOLreviMRwZvgQ$  qd 1 month supply at discharge from detox.       ED Course: Blood pressure 95/65, respiratory rate 23, pulse 96, satting 100% on room air glucose 198, CO2 20, BUN 26, creatinine 1.71, anion gap 16, chloride 101, potassium 4.9, sodium 137, BNP 51, troponin 14, WBC 12.7, hemoglobin 14.7, hematocrit 41.5, platelets 173 Cxr personally reviewed, negative.      Hospital Course:  Principal Problem:   Syncope Active Problems:   Polysubstance abuse ([name])   Essential hypertension   CKD (chronic kidney disease) stage 3, GFR 30-59 ml/min ([name])   Leukocytosis   AKI (acute kidney injury) ([name])   High anion gap metabolic acidosis   Diarrhea  Uncontrolled type 1 diabetes mellitus with hyperglycemia, with long-term current use of insulin ([place])   1.syncope Likely 2/2 dehydration/hypovolemic Patient with diarrhea, hypotensive on presentation and AKI Cardiology ruled out STEMI. TP negative,without chest pain UDS + cocaine, THC, alcohol level was not checked on admission, reports a few shots of alcohol at home Resolved  No bm since in the hospital, reports diarrhea stopped, reports  feeling back to normal, desires to go home, he declined my offer to update his family Outpatient cardiology follow up     2. DM I uncontrolled with hyperglycemia , reports h/o noncompliance A1c 11% Diabetic diet Continue insulin Diabetes education here and outpatient referral F/u with endocrinology       3. HTN On presentation was hypotensive, home bp meds held, he received hydration, bp started to become elevated, resume home bp meds     4. AKI on CKD stage IIIa AKI likely from hypovolemia, dehydration in setting of diarrhea and elevated bun. Ua no sign of infection Diarrhea resolved Avoid nephrotoxic meds  cr coming down towards baseline with ivf, encourage oral intake, repeat bmp next week  5. Diarrhea After eating fish. Had several episodes yesterday . None since admitted to the hospital No b/v, no ab pain      6. Polysubstance abuse Was released from detox last month after presenting for detox for cocaine and heroin. UDS + Avoid pain meds Was not prescribed suboxone on discharge   7.leukocytosis/lactic acidosis Negative beta-hydroxybutyrate. Elevated AG on admission, resolved after hydration  Likely from dehydration, received hydration, labs improved  Afebrile  Repeat labs next week   8. Medication and medical follow up noncompliance Per pcp "has no showed a few times (same with Cardiology). Very nonadherent with medications and office visits" Encourage compliance   Discharge Exam: BP (!) 139/93 (BP Location: Right Wrist)   Pulse 89   Temp 98.4 F (36.9 C) (Oral)   Resp 16   Ht $R'5\' 9"'eI$  (1.753 m)   Wt 73.4 kg   SpO2 99%   BMI 23.91 kg/m   General: aaox3 Cardiovascular: RRR Respiratory: normal respiratory effort    Discharge Instructions     Ambulatory referral to Nutrition and Diabetic Education   Complete by: As directed    Diet - low sodium heart healthy   Complete by: As directed    Carb modified diet   Increase activity slowly   Complete  by: As directed       Allergies as of 09/20/2021   No Known Allergies      Medication List     TAKE these medications    Accu-Chek Guide Me w/Device Kit Use to check blood sugar TID. E11.65   Accu-Chek Guide test strip Generic drug: glucose blood use 1 test strip to check blood glucose 5 times per day   Accu-Chek Softclix Lancets lancets use 1 lancet to check blood glucose 5 times per day   amLODipine 10 MG tablet Commonly known as: NORVASC Take 1 tablet (10 mg total) by mouth daily.   carvedilol 12.5 MG tablet Commonly known as: COREG Take 1 tablet (12.5 mg total) by mouth 2 (two) times daily with a meal.   FreeStyle Libre 14 Day Reader Kerrin Mo Use as instructed. Check blood glucose level by fingerstick 5 times per day. E11.65 Z79.4   FreeStyle Libre 2 Reader Shokan Use as instructed. Check blood glucose level by fingerstick 5 times per day. E11.65 Z79.4   FreeStyle Libre 2 [employer] Use as instructed. Check  blood glucose level 5 times per day.   insulin aspart 100 UNIT/ML FlexPen Commonly known as: NOVOLOG Inject 2-10 Units into the skin 3 (three) times daily with meals. Glucose 151-200 take 2 units, glucose 201-250 take 4 units, glucose 251-300 take 6 units, glucose 301-350 take 8 units, glucose 351-400 take 10 units   ketoconazole 2 % cream Commonly known as: NIZORAL Apply 1 application topically daily.   lisinopril 5 MG tablet Commonly known as: ZESTRIL Take 1 tablet (5 mg total) by mouth daily.   NovoLOG Mix 70/30 FlexPen (70-30) 100 UNIT/ML FlexPen Generic drug: insulin aspart protamine - aspart Inject 45 Units into the skin 2 (two) times daily with a meal.   sildenafil 100 MG tablet Commonly known as: Viagra Take 0.5-1 tablets (50-100 mg total) by mouth daily as needed for erectile dysfunction.   Suboxone 8-2 MG Film Generic drug: Buprenorphine HCl-Naloxone HCl Take 1 Film by mouth 3 (three) times daily.   TRUEplus 5-Bevel Pen Needles 32G X 4 MM  Misc Generic drug: Insulin Pen Needle Use as instructed to inject insulin.       No Known Allergies  Follow-up Information     Luke Pounds, NP Follow up in 1 week(s).   Specialty: Nurse Practitioner Why: hospital discharge follow up, repeat basic lab works including cbc/bmp pcp to monitor chornic back pain refer to spine surgeon pcp to refer to endocrinology for uncontrolled blood glucose Contact information: [place] [address] [phone]         O'Neal, Cassie Freer, MD .   Specialties: Cardiology, Internal Medicine, Radiology Contact information: [place] [address] [phone]         [facility] Follow up.   Specialty: Cardiology Why: outpatient heart monitor referral sent, please contact cardiology office if you do not hear from cardiology in three business days Contact information: [address] [place] [phone]                 The results of significant diagnostics from this hospitalization (including imaging, microbiology, ancillary and laboratory) are listed below for reference.    Significant Diagnostic Studies: DG Chest Port 1 View  Result Date: 09/19/2021 CLINICAL DATA:  Weakness.  Syncopal. EXAM: PORTABLE CHEST 1 VIEW COMPARISON:  Chest radiograph 01/20/2021 and earlier. FINDINGS: The heart size and mediastinal contours are within normal limits. Both lungs are clear. No pleural effusion or pneumothorax. The visualized skeletal structures are unremarkable. IMPRESSION: No acute cardiopulmonary abnormality. Electronically Signed   By: Ileana Roup M.D.   On: 09/19/2021 13:19    Microbiology: No results found for this or any previous visit (from the past 240 hour(s)).   Labs: Basic Metabolic Panel: Recent Labs  Lab 09/19/21 1255 09/19/21 1807 09/20/21 1044  NA 137 135 134*  K 4.9 3.8 4.6  CL 101 101 104  CO2 20* 20* 21*   GLUCOSE 198* 437* 359*  BUN 26* 30* 22*  CREATININE 1.71* 1.77* 1.41*  CALCIUM 10.0 8.7* 8.4*  MG  --   --  1.9   Liver Function Tests: Recent Labs  Lab 09/20/21 1044  AST 41  ALT 42  ALKPHOS 103  BILITOT 0.4  PROT 5.4*  ALBUMIN 3.1*   No results for input(s): "LIPASE", "AMYLASE" in the last 168 hours. No results for input(s): "AMMONIA" in the last 168 hours. CBC: Recent Labs  Lab 09/19/21 1255  WBC 12.7*  NEUTROABS 6.6  HGB 14.7  HCT 41.5  MCV 85.2  PLT 173   Cardiac Enzymes: No results for input(s): "CKTOTAL", "CKMB", "CKMBINDEX", "TROPONINI" in the last 168 hours. BNP: BNP (last 3 results) Recent Labs    09/19/21 1255  BNP 51.2    ProBNP (last 3 results) No results for input(s): "PROBNP" in the last 8760 hours.  CBG: Recent Labs  Lab 09/20/21 0033 09/20/21 0339 09/20/21 0614 09/20/21 0807 09/20/21 1207  GLUCAP 309* 134* 70 161* 369*    FURTHER DISCHARGE INSTRUCTIONS:   Get Medicines reviewed and adjusted: Please take all your medications with you for your next visit with your Primary MD   Laboratory/radiological data: Please request your Primary MD to go over all hospital tests and procedure/radiological results at the follow up, please ask your Primary MD to get all Hospital records sent to his/her office.   In some cases, they will be blood work, cultures and biopsy results pending at the time of your discharge. Please request that your primary care M.D. goes through all the records of your hospital data and follows up on these results.   Also Note the following: If you experience worsening of your admission symptoms, develop shortness of breath, life threatening emergency, suicidal or homicidal thoughts you must seek medical attention immediately by calling 911 or calling your MD immediately  if symptoms less severe.   You must read complete instructions/literature along with all the possible adverse reactions/side effects for all the  Medicines you take and that have been prescribed to you. Take any new Medicines after you have completely understood and accpet all the possible adverse reactions/side effects.    Do not drive when taking Pain medications or sleeping medications (Benzodaizepines)   Do not take more than prescribed Pain, Sleep and Anxiety Medications. It is not advisable to combine anxiety,sleep and pain medications without talking with your primary care practitioner   Special Instructions: If you have smoked or chewed Tobacco  in the last 2 yrs please stop smoking, stop any regular Alcohol  and or any Recreational drug use.   Wear Seat belts while driving.   Please note: You were cared for by a hospitalist during your hospital stay. Once you are discharged, your primary care physician will handle any further medical issues. Please note that NO REFILLS for any discharge medications will be authorized once you are discharged, as it is imperative that you return to your primary care physician (or establish a relationship with a primary care physician if you do not have one) for your post hospital discharge needs so that they can reassess your need for medications and monitor your lab values.     Signed:  Florencia Reasons MD, PhD, FACP  [facility] 09/20/2021, 9:49 PM

## 2021-09-20 NOTE — Inpatient Diabetes Management (Signed)
Inpatient Diabetes Program Recommendations  AACE/ADA: New Consensus Statement on Inpatient Glycemic Control (2015)  Target Ranges:  Prepandial:   less than 140 mg/dL      Peak postprandial:   less than 180 mg/dL (1-2 hours)      Critically ill patients:  140 - 180 mg/dL   Lab Results  Component Value Date   GLUCAP 161 (H) 09/20/2021   HGBA1C 11.1 (H) 09/19/2021    Review of Glycemic Control  Latest Reference Range & Units 09/19/21 16:02 09/19/21 17:30 09/19/21 19:33 09/19/21 22:06 09/20/21 00:33 09/20/21 03:39 09/20/21 06:14 09/20/21 08:07  Glucose-Capillary 70 - 99 mg/dL 171 (H) 226 (H) 545 (HH) 426 (H) 309 (H) 134 (H) 70 161 (H)  (HH): Data is critically high (H): Data is abnormally high  Diabetes history: DM2 Outpatient Diabetes medications: 70/30 45 units bid, Novolog 2-10 units tid meal coverage, Libre 2 Current orders for Inpatient glycemic control: Semglee 15 units bid, Novolog 0-15 units q 4 hrs.  Inpatient Diabetes Program Recommendations:   -Add Novolog 4 units tid meal coverage if eats 50% -Change Novolog correction to tid + hs 0-5 units  A1c decreased from 12.1 on 05/01/21 to currently 11.1.  Thank you, Nani Gasser. Detta Mellin, RN, MSN, CDE  Diabetes Coordinator Inpatient Glycemic Control Team Team Pager [phone] (8am-5pm) 09/20/2021 10:54 AM

## 2021-09-21 ENCOUNTER — Other Ambulatory Visit: Payer: Self-pay

## 2021-09-21 ENCOUNTER — Telehealth: Payer: Self-pay

## 2021-09-21 ENCOUNTER — Other Ambulatory Visit: Payer: Self-pay | Admitting: Cardiology

## 2021-09-21 DIAGNOSIS — T671XXA Heat syncope, initial encounter: Secondary | ICD-10-CM

## 2021-09-21 DIAGNOSIS — R55 Syncope and collapse: Secondary | ICD-10-CM

## 2021-09-21 NOTE — Telephone Encounter (Signed)
Transition Care Management Follow-up Telephone Call Date of discharge and from where: 09/20/2021, [facility] How have you been since you were released from the hospital? He stated that he is feeling " a whole lot better." Any questions or concerns? No  Items Reviewed: Did the pt receive and understand the discharge instructions provided? Yes  Medications obtained and verified? Yes - he said that he has all of the medications, he just needs to pick up sensors for the [facility].  Other? No  Any new allergies since your discharge? No  Dietary orders reviewed? Yes Do you have support at home? Yes   Home Care and Equipment/Supplies: Were home health services ordered? no If so, what is the name of the agency? N/a  Has the agency set up a time to come to the patient's home? not applicable Were any new equipment or medical supplies ordered?  No What is the name of the medical supply agency? N/a Were you able to get the supplies/equipment? not applicable Do you have any questions related to the use of the equipment or supplies? No  Functional Questionnaire: (I = Independent and D = Dependent) ADLs: independent  Follow up appointments reviewed:  PCP Hospital f/u appt confirmed? Yes  Scheduled to see Geryl Rankins, NP - 10/23/2021. He wanted that appointment and did not want to check to see if an appointment was available sooner at another [facility].  [place] [name] f/u appt confirmed? Yes  Scheduled to see cardiology - 10/25/2021. . Are transportation arrangements needed? No  If their condition worsens, is the pt aware to call PCP or go to the Emergency Dept.? Yes Was the patient provided with contact information for the PCP's office or ED? Yes Was to pt encouraged to call back with questions or concerns? Yes

## 2021-09-22 NOTE — Telephone Encounter (Signed)
Can  you let him know to come in next week for repeat blood work for kidneys. Thanks!!

## 2021-09-25 NOTE — Telephone Encounter (Signed)
Patient name and DOB verified. Patient aware of office hours to return for lab test.

## 2021-10-03 ENCOUNTER — Other Ambulatory Visit: Payer: Self-pay

## 2021-10-04 ENCOUNTER — Other Ambulatory Visit: Payer: Self-pay | Admitting: Nurse Practitioner

## 2021-10-04 ENCOUNTER — Other Ambulatory Visit: Payer: Self-pay

## 2021-10-04 MED ORDER — INSULIN ASPART 100 UNIT/ML FLEXPEN
2.0000 [IU] | PEN_INJECTOR | Freq: Three times a day (TID) | SUBCUTANEOUS | 0 refills | Status: DC
  Filled 2021-10-04: qty 9, 37d supply, fill #0

## 2021-10-04 NOTE — Telephone Encounter (Signed)
Medication Refill - Medication: insulin aspart (NOVOLOG) 100 UNIT/ML FlexPen  Pt stated he is completely out of medication. Pt stated that he finished medication yesterday..   Has the patient contacted their pharmacy? Yes.   No,more refills.   (Agent: If no, request that the patient contact the pharmacy for the refill. If patient does not wish to contact the pharmacy document the reason why and proceed with request.)   Preferred Pharmacy (with phone number or street name):  [facility]. [address] [place] [address]  Phone: [phone] Fax: [phone]  Hours: M-F 7:30a-6:00p   Has the patient been seen for an appointment in the last year OR does the patient have an upcoming appointment? Yes.    Agent: Please be advised that RX refills may take up to 3 business days. We ask that you follow-up with your pharmacy.

## 2021-10-04 NOTE — Telephone Encounter (Signed)
Requested medication (s) are due for refill today: Yes  Requested medication (s) are on the active medication list:Yes  Last refill:  10/10/20  Future visit scheduled:Yes  Notes to clinic:  Unable to refill per protocol, last refill by another provider. Patient is out as of yesterday.     Requested Prescriptions  Pending Prescriptions Disp Refills   insulin aspart (NOVOLOG) 100 UNIT/ML FlexPen 9 mL 2    Sig: Inject 2-10 Units into the skin 3 (three) times daily with meals. Glucose 151-200 take 2 units, glucose 201-250 take 4 units, glucose 251-300 take 6 units, glucose 301-350 take 8 units, glucose 351-400 take 10 units     Endocrinology:  Diabetes - Insulins Failed - 10/04/2021  4:17 PM      Failed - HBA1C is between 0 and 7.9 and within 180 days    Hemoglobin A1C  Date Value Ref Range Status  04/11/2014 11.3 (H) 4.2 - 6.3 % Final    Comment:    The American Diabetes Association recommends that a primary goal of therapy should be <7% and that physicians should reevaluate the treatment regimen in patients with HbA1c values consistently >8%.    HbA1c POC (<> result, manual entry)  Date Value Ref Range Status  03/24/2018 >15 4.0 - 5.6 % Final   Hgb A1c MFr Bld  Date Value Ref Range Status  09/19/2021 11.1 (H) 4.8 - 5.6 % Final    Comment:    (NOTE) Pre diabetes:          5.7%-6.4%  Diabetes:              >6.4%  Glycemic control for   <7.0% adults with diabetes          Passed - Valid encounter within last 6 months    Recent Outpatient Visits           5 months ago Type 1 diabetes mellitus with other circulatory complication [facility])   [place], [name], NP   8 months ago Hospital discharge follow-up   [place] Gildardo Pounds, NP   11 months ago Hospital discharge follow-up   [place], Zelda W, NP   1 year ago Insulin dependent type 2 diabetes mellitus  [facility])   [place], Zelda W, NP   2 years ago Hospital discharge follow-up   [place] Gildardo Pounds, NP       Future Appointments             In 2 weeks Gildardo Pounds, NP [place]   In 3 weeks Rake, Jossie Ng, NP [facility], [facility]

## 2021-10-10 NOTE — Telephone Encounter (Signed)
error 

## 2021-10-17 ENCOUNTER — Ambulatory Visit: Payer: Medicaid Other | Admitting: Nutrition

## 2021-10-23 ENCOUNTER — Other Ambulatory Visit: Payer: Self-pay

## 2021-10-23 ENCOUNTER — Ambulatory Visit: Payer: Medicaid Other | Attending: Nurse Practitioner | Admitting: Nurse Practitioner

## 2021-10-23 ENCOUNTER — Encounter: Payer: Self-pay | Admitting: Nurse Practitioner

## 2021-10-23 VITALS — BP 144/93 | HR 128 | Temp 97.9°F | Ht 69.0 in | Wt 167.0 lb

## 2021-10-23 DIAGNOSIS — E1059 Type 1 diabetes mellitus with other circulatory complications: Secondary | ICD-10-CM | POA: Diagnosis not present

## 2021-10-23 DIAGNOSIS — I10 Essential (primary) hypertension: Secondary | ICD-10-CM

## 2021-10-23 DIAGNOSIS — Z09 Encounter for follow-up examination after completed treatment for conditions other than malignant neoplasm: Secondary | ICD-10-CM

## 2021-10-23 DIAGNOSIS — D72829 Elevated white blood cell count, unspecified: Secondary | ICD-10-CM

## 2021-10-23 DIAGNOSIS — N521 Erectile dysfunction due to diseases classified elsewhere: Secondary | ICD-10-CM

## 2021-10-23 DIAGNOSIS — R350 Frequency of micturition: Secondary | ICD-10-CM

## 2021-10-23 MED ORDER — INSULIN ASPART 100 UNIT/ML FLEXPEN
2.0000 [IU] | PEN_INJECTOR | Freq: Three times a day (TID) | SUBCUTANEOUS | 6 refills | Status: DC
  Filled 2021-10-23: qty 9, 37d supply, fill #0

## 2021-10-23 MED ORDER — LISINOPRIL 5 MG PO TABS
5.0000 mg | ORAL_TABLET | Freq: Every day | ORAL | 3 refills | Status: AC
  Filled 2021-10-23: qty 90, 90d supply, fill #0

## 2021-10-23 MED ORDER — CARVEDILOL 12.5 MG PO TABS
12.5000 mg | ORAL_TABLET | Freq: Two times a day (BID) | ORAL | 1 refills | Status: DC
  Filled 2021-10-23: qty 180, 90d supply, fill #0

## 2021-10-23 MED ORDER — NOVOLOG MIX 70/30 FLEXPEN (70-30) 100 UNIT/ML ~~LOC~~ SUPN
45.0000 [IU] | PEN_INJECTOR | Freq: Two times a day (BID) | SUBCUTANEOUS | 1 refills | Status: AC
  Filled 2021-10-23: qty 84, 93d supply, fill #0
  Filled 2021-11-17: qty 27, 30d supply, fill #0
  Filled 2021-12-13: qty 27, 30d supply, fill #1
  Filled 2022-01-09: qty 27, 30d supply, fill #2
  Filled 2022-02-23: qty 27, 30d supply, fill #3

## 2021-10-23 MED ORDER — GLUCOSE BLOOD VI STRP
ORAL_STRIP | 0 refills | Status: DC
  Filled 2021-10-23: qty 200, 40d supply, fill #0
  Filled 2022-01-17: qty 200, 30d supply, fill #0

## 2021-10-23 MED ORDER — FREESTYLE LIBRE 2 SENSOR MISC
3 refills | Status: AC
  Filled 2021-10-23: qty 2, 28d supply, fill #0
  Filled 2021-11-14: qty 2, 28d supply, fill #1
  Filled 2021-12-13: qty 2, 28d supply, fill #2
  Filled 2022-01-09: qty 2, 28d supply, fill #3
  Filled 2022-01-25 – 2022-01-30 (×3): qty 2, 28d supply, fill #4
  Filled 2022-02-28 – 2022-03-14 (×4): qty 2, 28d supply, fill #5

## 2021-10-23 MED ORDER — SILDENAFIL CITRATE 100 MG PO TABS
50.0000 mg | ORAL_TABLET | Freq: Every day | ORAL | 6 refills | Status: AC | PRN
  Filled 2021-10-23 – 2022-01-09 (×2): qty 10, 30d supply, fill #0

## 2021-10-23 MED ORDER — AMLODIPINE BESYLATE 10 MG PO TABS
10.0000 mg | ORAL_TABLET | Freq: Every day | ORAL | 1 refills | Status: DC
  Filled 2021-10-23: qty 90, 90d supply, fill #0

## 2021-10-23 NOTE — Progress Notes (Unsigned)
Assessment & Plan:  Luke [name] was seen today for hospitalization follow-up.  Diagnoses and all orders for this visit:  Hospital discharge follow-up  Urine frequency -     PSA  Type 1 diabetes mellitus with other circulatory complication  Follow up with Endocrinology  -     CMP14+EGFR -     lisinopril (ZESTRIL) 5 MG tablet; Take 1 tablet (5 mg total) by mouth daily. -     insulin aspart protamine - aspart (NOVOLOG MIX 70/30 FLEXPEN) (70-30) 100 UNIT/ML FlexPen; Inject 45 Units into the skin 2 (two) times daily with a meal. -     glucose blood test strip; Use as directed to check blood glucose 5 times per day -     Continuous Blood Gluc Sensor (FREESTYLE LIBRE 2 SENSOR) MISC; Use as instructed. Check blood glucose level 5 times per day.  Leukocytosis, unspecified type -     CBC with Differential  Primary hypertension -     CMP14+EGFR -     carvedilol (COREG) 12.5 MG tablet; Take 1 tablet (12.5 mg total) by mouth 2 (two) times daily with a meal. -     amLODipine (NORVASC) 10 MG tablet; Take 1 tablet (10 mg total) by mouth daily.  Erectile dysfunction due to diseases classified elsewhere -     sildenafil (VIAGRA) 100 MG tablet; Take 0.5-1 tablets (50-100 mg total) by mouth daily as needed for erectile dysfunction.    Patient has been counseled on age-appropriate routine health concerns for screening and prevention. These are reviewed and up-to-date. Referrals have been placed accordingly. Immunizations are up-to-date or declined.    Subjective:   Chief Complaint  Patient presents with   Hospitalization Follow-up   HPI Luke [name] 41 y.o. male presents to office today for Hospital follow up.  He has a past medical history of Anemia, Anxiety ( Dx 2008), Cellulitis, Chronic HEP C, CKD, POORLY CONTROLLED IDDM (Dx 2008), Endocarditis (04/2019), GERD (Dx 2008),  Heart murmur, Hepatitis C, Hypertension, Hypothyroidism, MRSA infection, Pneumonia, Seizures (2011 ), Substance abuse  (2013)   HFU  09-19-2021 through 09-20-2021 Admitted after experiencing syncopal episode while sitting on his patio outside. It was felt his syncopal episode was related to diarrhea he was experiencing. He was also noted for AKI and hypotension. UDS +Cocaine and THC. Received IVF for hydration and BP normalized prior to discharge.  Today he denies any episodes of syncope since discharge.   IDDM Poorly controlled. He was referred to Endocrinology at his last visit with me on 05-01-2021. He does not have an appt with them until November.  We are having a difficult time getting his diabetes under control. Currently prescribed Novolog 70/30 45 units BID. He has not done well with lantus, levemir or sliding scale insulin.  His A1c has consistently been elevated >10. Lab Results  Component Value Date   HGBA1C 11.1 (H) 09/19/2021        Review of Systems  Constitutional:  Negative for fever, malaise/fatigue and weight loss.  HENT: Negative.  Negative for nosebleeds.   Eyes: Negative.  Negative for blurred vision, double vision and photophobia.  Respiratory: Negative.  Negative for cough and shortness of breath.   Cardiovascular: Negative.  Negative for chest pain, palpitations and leg swelling.  Gastrointestinal: Negative.  Negative for heartburn, nausea and vomiting.  Genitourinary:  Positive for frequency.  Musculoskeletal: Negative.  Negative for myalgias.  Neurological: Negative.  Negative for dizziness, focal weakness, seizures and headaches.  Psychiatric/Behavioral: Negative.  Negative for suicidal ideas.     Past Medical History:  Diagnosis Date   Anemia    Anxiety  Dx 2008   Cellulitis    Chronic kidney disease    Diabetes mellitus without complication ([place]) Dx 9357   DKA (diabetic ketoacidoses) 07/21/2017   Endocarditis 04/2019   Endocarditis    GERD (gastroesophageal reflux disease) Dx 2008   Headache(784.0)    Heart murmur    Hepatitis C    Hypertension    Hypothyroidism     MRSA infection    Necrotizing fasciitis of multiple sites ([place])    Pneumonia    Seizures ([place]) 2011   x 2 in lifetime. on Dilantin for a while.    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, multiple relapses   Type 1 diabetes [facility])     Past Surgical History:  Procedure Laterality Date   AMPUTATION Left 09/05/2018   Procedure: LEFT GREAT TOE AMPUTATION;  Surgeon: [name], MD;  Location: [place];  Service: Orthopedics;  Laterality: Left;   APPLICATION OF A-CELL OF EXTREMITY Bilateral 09/01/2019   Procedure: APPLICATION OF MESHED PRIMATRIX AG OF EXTREMITY;  Surgeon: Cindra Presume, MD;  Location: [place];  Service: Plastics;  Laterality: Bilateral;   I & D EXTREMITY Left 10/11/2012   Procedure: IRRIGATION AND DEBRIDEMENT ABSCESS FOREARM;  Surgeon: Linna Hoff, MD;  Location: [place];  Service: Orthopedics;  Laterality: Left;   I & D EXTREMITY Left 10/12/2012   Procedure: IRRIGATION AND DEBRIDEMENT FOREARM;  Surgeon: Linna Hoff, MD;  Location: [place];  Service: Orthopedics;  Laterality: Left;   I & D EXTREMITY Left 10/14/2012   Procedure: incision and drainage left forearm;  Surgeon: Linna Hoff, MD;  Location: [place];  Service: Orthopedics;  Laterality: Left;   I & D EXTREMITY Left 10/16/2012   Procedure: IRRIGATION AND DEBRIDEMENT LEFT FOREARM;  Surgeon: Linna Hoff, MD;  Location: [place];  Service: Orthopedics;  Laterality: Left;   I & D EXTREMITY Left 10/20/2012   Procedure: INCISION AND DRAINAGE AND DEBRIDEMENT LEFT  FOREARM;  Surgeon: Linna Hoff, MD;  Location: [place];  Service: Orthopedics;  Laterality: Left;   I & D EXTREMITY Right 05/22/2019   Procedure: IRRIGATION AND DEBRIDEMENT RIGHT ARM;  Surgeon: [name], MD;  Location: [place];  Service: Orthopedics;  Laterality: Right;   INCISION AND DRAINAGE OF WOUND Bilateral 11/29/2017   Procedure: DEBRIDEMENT BILATERAL FEET, DEBRIDEMENT LEFT ANKLE, AND APPLY WOUND VAC;  Surgeon: [name], MD;  Location: [place];  Service: Orthopedics;  Laterality: Bilateral;   INCISION AND DRAINAGE OF WOUND Bilateral 09/01/2019   Procedure: IRRIGATION AND DEBRIDEMENT FOREARM WOUNDS;  Surgeon: Cindra Presume, MD;  Location: [place];  Service: Plastics;  Laterality: Bilateral;   IRRIGATION AND DEBRIDEMENT ABSCESS     Hx: of left arm abscess related to drug use    TEE WITHOUT CARDIOVERSION N/A 11/28/2017   Procedure: TRANSESOPHAGEAL ECHOCARDIOGRAM (TEE);  Surgeon: Sanda Klein, MD;  Location: [place];  Service: Cardiovascular;  Laterality: N/A;   TEE WITHOUT CARDIOVERSION N/A 05/12/2019   Procedure: TRANSESOPHAGEAL ECHOCARDIOGRAM (TEE);  Surgeon: Skeet Latch, MD;  Location: [facility] ENDOSCOPY;  Service: Cardiovascular;  Laterality: N/A;    Family History  Problem Relation Age of Onset   Diabetes Father    Heart disease Father    Mental illness Sister    Cancer Neg Hx     Social  History Reviewed with no changes to be made today.   Outpatient Medications Prior to Visit  Medication Sig Dispense Refill   Accu-Chek Softclix Lancets lancets use 1 lancet to check blood glucose 5 times per day 200 each 0   Blood Glucose Monitoring Suppl (ACCU-CHEK GUIDE ME) w/Device KIT Use to check blood sugar TID. E11.65 1 kit 0   Continuous Blood Gluc Receiver (FREESTYLE LIBRE 14 DAY READER) DEVI Use as instructed. Check blood glucose level by fingerstick 5 times per day. E11.65 Z79.4 1 each 0   Continuous Blood Gluc Receiver (FREESTYLE LIBRE 2 READER) DEVI Use as instructed. Check blood glucose level by fingerstick 5 times per day. E11.65 Z79.4 1 each 11   Insulin Pen Needle (TRUEPLUS 5-BEVEL PEN NEEDLES) 32G X 4 MM MISC Use as instructed to inject insulin. 100 each 2   ketoconazole (NIZORAL) 2 % cream Apply 1 application topically daily. 60 g 2   SUBOXONE 8-2 MG FILM Take 1 Film by mouth 3 (three) times daily.     Continuous Blood Gluc Sensor (FREESTYLE LIBRE 2 SENSOR) MISC Use as instructed. Check blood glucose level 5 times  per day. 2 each 3   glucose blood test strip use 1 test strip to check blood glucose 5 times per day 200 each 0   insulin aspart (NOVOLOG) 100 UNIT/ML FlexPen Inject 2-10 Units into the skin 3 (three) times daily with meals. Glucose 151-200 take 2 units, glucose 201-250 take 4 units, glucose 251-300 take 6 units, glucose 301-350 take 8 units, glucose 351-400 take 10 units 9 mL 0   insulin aspart protamine - aspart (NOVOLOG MIX 70/30 FLEXPEN) (70-30) 100 UNIT/ML FlexPen Inject 45 Units into the skin 2 (two) times daily with a meal. 81 mL 1   lisinopril (ZESTRIL) 5 MG tablet Take 1 tablet (5 mg total) by mouth daily. 90 tablet 3   sildenafil (VIAGRA) 100 MG tablet Take 0.5-1 tablets (50-100 mg total) by mouth daily as needed for erectile dysfunction. 10 tablet 6   amLODipine (NORVASC) 10 MG tablet Take 1 tablet (10 mg total) by mouth daily. 90 tablet 1   carvedilol (COREG) 12.5 MG tablet Take 1 tablet (12.5 mg total) by mouth 2 (two) times daily with a meal. 180 tablet 1   No facility-administered medications prior to visit.    No Known Allergies     Objective:    BP (!) 144/93   Pulse (!) 128   Temp 97.9 F (36.6 C) (Oral)   Ht 5\' 9"  (1.753 m)   Wt 167 lb (75.8 kg)   SpO2 97%   BMI 24.66 kg/m  Wt Readings from Last 3 Encounters:  10/23/21 167 lb (75.8 kg)  09/19/21 161 lb 14.4 oz (73.4 kg)  05/17/21 177 lb 9.6 oz (80.6 kg)    Physical Exam Vitals and nursing note reviewed.  Constitutional:      Appearance: He is well-developed.  HENT:     Head: Normocephalic and atraumatic.  Cardiovascular:     Rate and Rhythm: Regular rhythm. Tachycardia present.     Heart sounds: Normal heart sounds. No murmur heard.    No friction rub. No gallop.  Pulmonary:     Effort: Pulmonary effort is normal. No tachypnea or respiratory distress.     Breath sounds: Normal breath sounds. No decreased breath sounds, wheezing, rhonchi or rales.  Chest:     Chest wall: No tenderness.  Abdominal:      General: Bowel sounds are normal.  Palpations: Abdomen is soft.  Musculoskeletal:        General: Normal range of motion.     Cervical back: Normal range of motion.  Skin:    General: Skin is warm and dry.  Neurological:     Mental Status: He is alert and oriented to person, place, and time.     Coordination: Coordination normal.  Psychiatric:        Behavior: Behavior normal. Behavior is cooperative.        Thought Content: Thought content normal.        Judgment: Judgment normal.          Patient has been counseled extensively about nutrition and exercise as well as the importance of adherence with medications and regular follow-up. The patient was given clear instructions to go to ER or return to medical center if symptoms don't improve, worsen or new problems develop. The patient verbalized understanding.   Follow-up: Return in about 3 months (around 01/23/2022).   Gildardo Pounds, FNP-BC [facility], [place]   10/24/2021, 8:01 PM

## 2021-10-24 ENCOUNTER — Other Ambulatory Visit: Payer: Self-pay

## 2021-10-24 ENCOUNTER — Encounter: Payer: Self-pay | Admitting: Nurse Practitioner

## 2021-10-24 LAB — CBC WITH DIFFERENTIAL/PLATELET
Basophils Absolute: 0.1 10*3/uL (ref 0.0–0.2)
Basos: 1 %
EOS (ABSOLUTE): 0.3 10*3/uL (ref 0.0–0.4)
Eos: 2 %
Hematocrit: 42.1 % (ref 37.5–51.0)
Hemoglobin: 14.2 g/dL (ref 13.0–17.7)
Immature Grans (Abs): 0 10*3/uL (ref 0.0–0.1)
Immature Granulocytes: 0 %
Lymphocytes Absolute: 5 10*3/uL — ABNORMAL HIGH (ref 0.7–3.1)
Lymphs: 32 %
MCH: 29.9 pg (ref 26.6–33.0)
MCHC: 33.7 g/dL (ref 31.5–35.7)
MCV: 89 fL (ref 79–97)
Monocytes Absolute: 0.9 10*3/uL (ref 0.1–0.9)
Monocytes: 6 %
Neutrophils Absolute: 9 10*3/uL — ABNORMAL HIGH (ref 1.4–7.0)
Neutrophils: 59 %
Platelets: 435 10*3/uL (ref 150–450)
RBC: 4.75 x10E6/uL (ref 4.14–5.80)
RDW: 12.3 % (ref 11.6–15.4)
WBC: 15.3 10*3/uL — ABNORMAL HIGH (ref 3.4–10.8)

## 2021-10-24 LAB — CMP14+EGFR
ALT: 42 IU/L (ref 0–44)
AST: 35 IU/L (ref 0–40)
Albumin/Globulin Ratio: 1.9 (ref 1.2–2.2)
Albumin: 4.6 g/dL (ref 4.1–5.1)
Alkaline Phosphatase: 139 IU/L — ABNORMAL HIGH (ref 44–121)
BUN/Creatinine Ratio: 14 (ref 9–20)
BUN: 20 mg/dL (ref 6–24)
Bilirubin Total: 0.5 mg/dL (ref 0.0–1.2)
CO2: 20 mmol/L (ref 20–29)
Calcium: 9.9 mg/dL (ref 8.7–10.2)
Chloride: 102 mmol/L (ref 96–106)
Creatinine, Ser: 1.48 mg/dL — ABNORMAL HIGH (ref 0.76–1.27)
Globulin, Total: 2.4 g/dL (ref 1.5–4.5)
Glucose: 235 mg/dL — ABNORMAL HIGH (ref 70–99)
Potassium: 4.6 mmol/L (ref 3.5–5.2)
Sodium: 139 mmol/L (ref 134–144)
Total Protein: 7 g/dL (ref 6.0–8.5)
eGFR: 61 mL/min/{1.73_m2} (ref >59)

## 2021-10-24 LAB — PSA: Prostate Specific Ag, Serum: 0.3 ng/mL (ref 0.0–4.0)

## 2021-10-24 NOTE — Telephone Encounter (Signed)
Sent EPIC message to monitor dept to have them download monitor. PER message they state that pt received monitor but has not applied.   Called pt he verifies that he received the monitor but has not applied this monitor. He states that he "did not know what this was or where it came from" informed we sent mychart message. "I do not use mychart my wife has access" I do not think that she checked mychart. He states that he will bring this with him to appt tomorrow.

## 2021-10-24 NOTE — Progress Notes (Unsigned)
Cardiology Clinic Note   Patient Name: Luke [name] Date of Encounter: 10/25/2021  Primary Care Provider:  Gildardo Pounds, NP Primary Cardiologist:  Evalina Field, MD  Patient Profile    Luke [name] 41 year old male presents the clinic today for follow-up evaluation of his mitral valve endocarditis and mitral valve regurgitation.  Past Medical History    Past Medical History:  Diagnosis Date   Anemia    Anxiety  Dx 2008   Cellulitis    Chronic kidney disease    Diabetes mellitus without complication ([place]) Dx 3532   DKA (diabetic ketoacidoses) 07/21/2017   Endocarditis 04/2019   Endocarditis    GERD (gastroesophageal reflux disease) Dx 2008   Headache(784.0)    Heart murmur    Hepatitis C    Hypertension    Hypothyroidism    MRSA infection    Necrotizing fasciitis of multiple sites ([place])    Pneumonia    Seizures ([place]) 2011   x 2 in lifetime. on Dilantin for a while.    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, multiple relapses   Type 1 diabetes [facility])    Past Surgical History:  Procedure Laterality Date   AMPUTATION Left 09/05/2018   Procedure: LEFT GREAT TOE AMPUTATION;  Surgeon: [name], MD;  Location: [place];  Service: Orthopedics;  Laterality: Left;   APPLICATION OF A-CELL OF EXTREMITY Bilateral 09/01/2019   Procedure: APPLICATION OF MESHED PRIMATRIX AG OF EXTREMITY;  Surgeon: Cindra Presume, MD;  Location: [place];  Service: Plastics;  Laterality: Bilateral;   I & D EXTREMITY Left 10/11/2012   Procedure: IRRIGATION AND DEBRIDEMENT ABSCESS FOREARM;  Surgeon: Linna Hoff, MD;  Location: [place];  Service: Orthopedics;  Laterality: Left;   I & D EXTREMITY Left 10/12/2012   Procedure: IRRIGATION AND DEBRIDEMENT FOREARM;  Surgeon: Linna Hoff, MD;  Location: [place];  Service: Orthopedics;  Laterality: Left;   I & D EXTREMITY Left 10/14/2012   Procedure: incision and drainage left forearm;  Surgeon: Linna Hoff, MD;  Location: [place];   Service: Orthopedics;  Laterality: Left;   I & D EXTREMITY Left 10/16/2012   Procedure: IRRIGATION AND DEBRIDEMENT LEFT FOREARM;  Surgeon: Linna Hoff, MD;  Location: [place];  Service: Orthopedics;  Laterality: Left;   I & D EXTREMITY Left 10/20/2012   Procedure: INCISION AND DRAINAGE AND DEBRIDEMENT LEFT  FOREARM;  Surgeon: Linna Hoff, MD;  Location: [place];  Service: Orthopedics;  Laterality: Left;   I & D EXTREMITY Right 05/22/2019   Procedure: IRRIGATION AND DEBRIDEMENT RIGHT ARM;  Surgeon: [name], MD;  Location: [place];  Service: Orthopedics;  Laterality: Right;   INCISION AND DRAINAGE OF WOUND Bilateral 11/29/2017   Procedure: DEBRIDEMENT BILATERAL FEET, DEBRIDEMENT LEFT ANKLE, AND APPLY WOUND VAC;  Surgeon: [name], MD;  Location: [place];  Service: Orthopedics;  Laterality: Bilateral;   INCISION AND DRAINAGE OF WOUND Bilateral 09/01/2019   Procedure: IRRIGATION AND DEBRIDEMENT FOREARM WOUNDS;  Surgeon: Cindra Presume, MD;  Location: [place];  Service: Plastics;  Laterality: Bilateral;   IRRIGATION AND DEBRIDEMENT ABSCESS     Hx: of left arm abscess related to drug use    TEE WITHOUT CARDIOVERSION N/A 11/28/2017   Procedure: TRANSESOPHAGEAL ECHOCARDIOGRAM (TEE);  Surgeon: Sanda Klein, MD;  Location: [place];  Service: Cardiovascular;  Laterality: N/A;   TEE WITHOUT CARDIOVERSION N/A 05/12/2019   Procedure: TRANSESOPHAGEAL  ECHOCARDIOGRAM (TEE);  Surgeon: Skeet Latch, MD;  Location: [place];  Service: Cardiovascular;  Laterality: N/A;    Allergies  No Known Allergies  History of Present Illness    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  He developed MV endocarditis in February 2021 in the setting of IVDA.  He was deemed not to be a candidate for surgery due to his IVDA.  He was readmitted 6/21 and his MR at that time was severe.  He was seen by Dr. Audie Box 10/30/2019.  During that time he reported he was doing well after  being discharged from the hospital.  His echocardiogram was reviewed.  He was noted to have a large systolic murmur that radiated into his axilla consistent with anterior mitral valve leaflet pathology.  He was tachycardic on exam with a heart rate of 109.  He denied further fever and chills.  He reported he had been off heroin and cocaine.  He reported he had not used any other drugs since being seen in the hospital.  His diabetes was severe and uncontrolled with an A1c of 10.8.  His lipid panel at that time showed an LDL cholesterol of 156.  He reported some swelling in his lower extremities.  He reported that he had previously taken furosemide but had been taken off the medication.  He reported that he was able to walk as far as he wanted however, did have some shortness of breath with increased physical activity.  His carvedilol was continued.  He was started on lisinopril 2.5 mg daily.  He was asked to return to clinic in 6 weeks but was lost to follow-up.  He presented to the clinic 08/15/2020 for follow-up evaluation stated he felt well.  He reported that he had been abstaining from IV drug use for approximately 1 year.  He reported that he did have some increased shortness of breath with increased physical activity.  We discussed his medications and he reported that he had not been taking most of his medications for around 6 months.  We reviewed his echocardiogram and the importance of medication compliance.  He expressed understanding.  I  refilled his carvedilol, amlodipine, gave him the salty 6 diet sheet, provided him with a blood pressure log, and planned follow-up in 1 month.  He presented to the emergency department on 09/19/2021 with near syncope.  A 30-day cardiac event monitor was ordered but has not yet resulted.  He presents to the clinic today for follow-up evaluation and states he received a cardiac event monitor but did not know how to apply it.  We reviewed his hospitalization and he  expressed understanding.  He has had no further episodes of presyncope.  He reports that he took cocaine yesterday.  His heart rate today is 139.  His initial blood pressure was 153/93 and on recheck is 144/86.  He has not been taking his medications for the last 2 months.  Medications were reviewed with clinical pharmacist.  I will discontinue his carvedilol and resume his amlodipine and lisinopril.  Agreeable.  BMP in 1 week, we will apply his cardiac event monitor and plan follow-up after monitor results have resulted.  Social work consult to aid in medication compliance.  Today he denies chest pain, increased shortness of breath, lower extremity edema, fatigue, palpitations, melena, hematuria, hemoptysis, diaphoresis, weakness, presyncope, syncope, orthopnea, and PND.       Home Medications    Prior to Admission medications  Medication Sig Start Date End Date Taking? Authorizing Provider  Accu-Chek Softclix Lancets lancets USE TO CHECK BLOOD SUGAR THREE TIMES DAILY E11.65 01/14/20 01/13/21  Charlott Rakes, MD  albuterol (VENTOLIN HFA) 108 (90 Base) MCG/ACT inhaler Inhale 2 puffs into the lungs every 6 (six) hours as needed for wheezing or shortness of breath. 09/18/19   Gildardo Pounds, NP  amLODipine (NORVASC) 5 MG tablet Take 5 mg by mouth daily.    [provider]  amoxicillin-clavulanate (AUGMENTIN) 875-125 MG tablet Take 1 tablet by mouth every 12 (twelve) hours. 04/10/20   Hazel Sams, PA-C  aspirin EC 81 MG tablet Take 1 tablet (81 mg total) by mouth daily. Swallow whole. 10/30/19   O'NealCassie Freer, MD  atorvastatin (LIPITOR) 40 MG tablet Take 1 tablet (40 mg total) by mouth daily. 06/12/20 09/10/20  Gildardo Pounds, NP  Bismuth Tribromoph-Petrolatum (XEROFORM PETROLATUM ROLL 4"X9') MISC Apply topically to affected area as prescribed 09/18/19   Gildardo Pounds, NP  Blood Glucose Monitoring Suppl (ACCU-CHEK GUIDE ME) w/Device KIT Use to check blood sugar TID. E11.65  01/14/20   Charlott Rakes, MD  Blood Glucose Monitoring Suppl (ACCU-CHEK GUIDE) w/Device KIT USE TO CHECK BLOOD SUGAR THREE TIMES DAILY 01/14/20 01/13/21  Charlott Rakes, MD  carvedilol (COREG) 6.25 MG tablet Take 1 tablet (6.25 mg total) by mouth 2 (two) times daily with a meal. 09/18/19 12/17/19  Gildardo Pounds, NP  DULoxetine (CYMBALTA) 30 MG capsule Take 1 capsule (30 mg total) by mouth daily. 09/18/19 12/17/19  Gildardo Pounds, NP  ferrous sulfate 325 (65 FE) MG tablet Take 1 tablet (325 mg total) by mouth daily with breakfast. 09/18/19   Gildardo Pounds, NP  furosemide (LASIX) 20 MG tablet Take 1 tablet (20 mg total) by mouth daily for 14 days. 10/30/19 11/18/19  O'NealCassie Freer, MD  Gauze Pads & Dressings ([place]) MISC Apply to affected area as prescribed 09/18/19   Gildardo Pounds, NP  glucose blood test strip USE TO CHECK BLOOD SUGAR THREE TIMES DAILY E11.65 01/14/20 01/13/21  Charlott Rakes, MD  insulin glargine (LANTUS SOLOSTAR) 100 UNIT/ML Solostar Pen Inject 40 Units into the skin 2 (two) times daily. 06/23/20   Charlott Rakes, MD  insulin lispro (HUMALOG) 100 UNIT/ML KwikPen INJECT 4-20 UNITS INTO THE SKIN 3 (THREE) TIMES DAILY. PER SLIDING SCALE Patient taking differently: Inject 5 Units into the skin See admin instructions. Take 5 units at meal time and sliding scale through out the day Above 200  Take 2 units up to 400 units take 14 units 09/18/19 09/17/20  Gildardo Pounds, NP  Insulin Pen Needle (TRUEPLUS 5-BEVEL PEN NEEDLES) 32G X 4 MM MISC Use as instructed to inject insulin. 11/27/19   Charlott Rakes, MD  Insulin Pen Needle 31G X 5 MM MISC USE AS INSTRUCTED TO INJECT INSULIN. 11/27/19 11/26/20  Charlott Rakes, MD  levothyroxine (SYNTHROID) 50 MCG tablet Take 1 tablet (50 mcg total) by mouth daily. 09/18/19   Gildardo Pounds, NP  lidocaine (XYLOCAINE) 2 % solution Use as directed 15 mLs in the mouth or throat as needed for mouth pain. 04/10/20   Hazel Sams, PA-C   lisinopril (ZESTRIL) 2.5 MG tablet Take 1 tablet (2.5 mg total) by mouth daily. 10/30/19 01/28/20  Geralynn Rile, MD  Melatonin 5 MG CHEW Chew 5 mg by mouth at bedtime.    [provider]  omeprazole (PRILOSEC) 20 MG capsule Take 1 capsule (20 mg total)  by mouth daily. 09/18/19 12/17/19  Gildardo Pounds, NP  sertraline (ZOLOFT) 50 MG tablet Take 1 tablet (50 mg total) by mouth daily. 09/18/19   Gildardo Pounds, NP  sodium polystyrene (KAYEXALATE) 15 GM/60ML suspension TAKE 60 MLS (15 G TOTAL) BY MOUTH ONCE FOR 1 DOSE. 03/29/20 03/29/21  Charlott Rakes, MD  sodium polystyrene (KAYEXALATE) 15 GM/60ML suspension TAKE 120 MLS (30 G TOTAL) BY MOUTH ONCE FOR 1 DOSE. 03/22/20 03/22/21  Gildardo Pounds, NP  traZODone (DESYREL) 50 MG tablet Take 1 tablet (50 mg total) by mouth at bedtime. 09/18/19   Gildardo Pounds, NP    Family History    Family History  Problem Relation Age of Onset   Diabetes Father    Heart disease Father    Mental illness Sister    Cancer Neg Hx    He indicated that his mother is alive. He indicated that his father is deceased. He indicated that his sister is alive. He indicated that his brother is alive. He indicated that the status of his neg hx is unknown.  Social History    Social History   Socioeconomic History   Marital status: Married    Spouse name: Not on file   Number of children: 6   Years of education: some colle   Highest education level: Not on file  Occupational History   Occupation: Event set up    Employer: [employer]    Occupation: Stencil for cars   Tobacco Use   Smoking status: Every Day    Packs/day: 1.00    Years: 25.00    Total pack years: 25.00    Types: Cigarettes   Smokeless tobacco: Never   Tobacco comments:    decreased over the last few weeks because he has not been breathing well  Vaping Use   Vaping Use: Some days  Substance and Sexual Activity   Alcohol use: No    Alcohol/week: 0.0  standard drinks of alcohol    Comment: rarely   Drug use: Not Currently    Types: Cocaine, IV, Heroin    Comment: IVDU since Dad died in 06/18/06 with 15 month period of sobriety 2018-2019, relapse Aug 2019.   Sexual activity: Yes    Partners: Female    Birth control/protection: None  Other Topics Concern   Not on file  Social History Narrative   Lives with wife and two children age 69 and 12.          Social Determinants of Health   Financial Resource Strain: Not on file  Food Insecurity: Not on file  Transportation Needs: No Transportation Needs (09/10/2019)   PRAPARE - Hydrologist (Medical): No    Lack of Transportation (Non-Medical): No  Physical Activity: Not on file  Stress: Not on file  Social Connections: Moderately Isolated (09/10/2019)   Social Connection and Isolation Panel [NHANES]    Frequency of Communication with Friends and Family: More than three times a week    Frequency of Social Gatherings with Friends and Family: More than three times a week    Attends Religious Services: Never    Marine scientist or Organizations: No    Attends Music therapist: Not on file    Marital Status: Married  Human resources officer Violence: Not on file     Review of Systems    General:  No chills, fever, night sweats or weight changes.  Cardiovascular:  No chest pain, dyspnea on exertion, edema, orthopnea, palpitations, paroxysmal nocturnal dyspnea. Dermatological: No rash, lesions/masses Respiratory: No cough, dyspnea Urologic: No hematuria, dysuria Abdominal:   No nausea, vomiting, diarrhea, bright red blood per rectum, melena, or hematemesis Neurologic:  No visual changes, wkns, changes in mental status. All other systems reviewed and are otherwise negative except as noted above.  Physical Exam    VS:  BP (!) 153/93 (BP Location: Left Arm)   Pulse (!) 139   Ht $R'5\' 9"'Ca$  (1.753 m)   Wt 165 lb (74.8 kg)   SpO2 100%   BMI 24.37 kg/m   , BMI Body mass index is 24.37 kg/m. GEN: Well nourished, well developed, in no acute distress. HEENT: normal. Neck: Supple, no JVD, carotid bruits, or masses. Cardiac: RRR, no murmurs, rubs, or gallops. No clubbing, cyanosis, edema.  Radials/DP/PT 2+ and equal bilaterally.  Respiratory:  Respirations regular and unlabored, clear to auscultation bilaterally. GI: Soft, nontender, nondistended, BS + x 4. MS: no deformity or atrophy. Skin: warm and dry, no rash. Neuro:  Strength and sensation are intact. Psych: Normal affect.  Accessory Clinical Findings    Recent Labs: 09/19/2021: B Natriuretic Peptide 51.2 09/20/2021: Magnesium 1.9 10/23/2021: ALT 42; BUN 20; Creatinine, Ser 1.48; Hemoglobin 14.2; Platelets 435; Potassium 4.6; Sodium 139   Recent Lipid Panel    Component Value Date/Time   CHOL 244 (H) 03/04/2020 1544   CHOL 150 04/11/2014 0054   TRIG 134 10/04/2020 0224   TRIG 121 04/11/2014 0054   HDL 60 03/04/2020 1544   HDL 46 04/11/2014 0054   CHOLHDL 4.1 03/04/2020 1544   CHOLHDL 6.6 10/21/2017 1508   VLDL 45 (H) 10/21/2017 1508   VLDL 24 04/11/2014 0054   LDLCALC 157 (H) 03/04/2020 1544   [place] 80 04/11/2014 0054    ECG personally reviewed by me today-none today.  EKG 07/19/20 sinus tachycardia possible left atrial enlargement left ventricular hypertrophy 125 bpm  Echocardiogram 08/28/2019 1. Mitral and pulmonic valve vegetations. Endocarditis.   2. Left ventricular ejection fraction, by estimation, is 70 to 75%. The  left ventricle has hyperdynamic function. The left ventricle has no  regional wall motion abnormalities. Left ventricular diastolic parameters  were normal.   3. Right ventricular systolic function is normal. The right ventricular  size is normal.   4. There is a anterior mitral leaflet valvular vegetation (1.1 x 0.6 cm).  The mitral valve is abnormal. Moderate mitral valve regurgitation. No  evidence of mitral stenosis.   5. The aortic valve is normal  in structure. Aortic valve regurgitation is  mild. No aortic stenosis is present.   6. Possible vegetation on pulmonic valve. The pulmonic valve was  abnormal.   7. The inferior vena cava is normal in size with greater than 50%  respiratory variability, suggesting right atrial pressure of 3 mmHg.   Assessment & Plan   1.  Near syncope-denies episodes of lightheadedness, presyncope or syncope in the last several days.  Presented to the emergency department on 09/19/2021 and was discharged on 09/20/2021.  He reported that he had been sitting outside on his patio for about an hour and suddenly woke up on the ground.  He reported several episodes of diarrhea the previous day and a few shots of alcohol.  He did not remember falling and believes he had passed out.  EMS was called.  When EMS was called it was felt that his EKG showed STEMI.  He denied chest pain.  His EKG was  reviewed by cardiology and was not felt to have ST elevation.  A 30-day cardiac event monitor was ordered.  Lab work from the ED showed elevated glucose, stable creatinine and sodium of 134.  Repeat lab work 10/23/2021 showed improved blood glucose, stable creatinine and electrolytes, stable H&H Maintain p.o. hydration, move from sitting to standing slowly Avoid recreational drugs May wear lower extremity support stockings Apply cardiac event monitor Order BMP in 1 week  Essential hypertension-BP today 144/86 .  Currently not taking blood pressure medication.  We discussed the importance of medication compliance.  Continue amlodipine,lisinopril Heart healthy low-sodium diet Increase physical activity as tolerated Maintain blood pressure log   Mitral valve endocarditis/severe mitral regurgitation- systolic  murmur that radiates to his axillary region.  Previously suspected disruption of the anterior mitral valve leaflet resulting in eccentric jet in the posterior direction.  Not a candidate for surgery due to IVDA.  Reports cessation  of IV drug use.  UDS 09/20/21 positive for cocaine and THC. Continue lisinopril, amlodipine Heart healthy low-sodium diet-salty 6 reviewed Increase physical activity as tolerated  Essential hypertension-BP today 144/86  Currently not taking blood pressure medication.  We discussed the importance of medication compliance.   Restart amlodipine, lisinopril Heart healthy low-sodium diet Increase physical activity as tolerated Maintain blood pressure log   Disposition: Follow-up with Dr. Audie Box or me in 1.5-2 months   Jossie Ng. Neely Cecena NP-C    10/25/2021, 9:51 AM [address] Office [phone] Fax [phone]  Notice: This dictation was prepared with Dragon dictation along with smaller phrase technology. Any transcriptional errors that result from this process are unintentional and may not be corrected upon review.  I spent 14 minutes examining this patient, reviewing medications, and using patient centered shared decision making involving her cardiac care.  Prior to her visit I spent greater than 20 minutes reviewing her past medical history,  medications, and prior cardiac tests.

## 2021-10-25 ENCOUNTER — Other Ambulatory Visit: Payer: Self-pay

## 2021-10-25 ENCOUNTER — Ambulatory Visit: Payer: Medicaid Other | Admitting: General Practice

## 2021-10-25 ENCOUNTER — Encounter: Payer: Self-pay | Admitting: General Practice

## 2021-10-25 VITALS — BP 144/86 | HR 139 | Ht 69.0 in | Wt 165.0 lb

## 2021-10-25 DIAGNOSIS — I10 Essential (primary) hypertension: Secondary | ICD-10-CM

## 2021-10-25 DIAGNOSIS — E7800 Pure hypercholesterolemia, unspecified: Secondary | ICD-10-CM

## 2021-10-25 DIAGNOSIS — I340 Nonrheumatic mitral (valve) insufficiency: Secondary | ICD-10-CM | POA: Diagnosis not present

## 2021-10-25 DIAGNOSIS — R55 Syncope and collapse: Secondary | ICD-10-CM | POA: Diagnosis not present

## 2021-10-25 DIAGNOSIS — E1059 Type 1 diabetes mellitus with other circulatory complications: Secondary | ICD-10-CM

## 2021-10-25 MED ORDER — AMLODIPINE BESYLATE 10 MG PO TABS
5.0000 mg | ORAL_TABLET | Freq: Every day | ORAL | 3 refills | Status: AC
  Filled 2021-10-25: qty 30, 60d supply, fill #0

## 2021-10-25 NOTE — Patient Instructions (Signed)
Medication Instructions:  MAKE SURE TO TAKE YOUR AMLODIPINE 5MG  AND LISINOPRIL 5MG  DAILY  *If you need a refill on your cardiac medications before your next appointment, please call your pharmacy*  Lab Work:   Testing/Procedures:  BMET TODAY   NONE  If you have labs (blood work) drawn today and your tests are completely normal, you will receive your results only by: [place] (if you have MyChart) OR  A paper copy in the mail If you have any lab test that is abnormal or we need to change your treatment, we will call you to review the results.  Special Instructions PLEASE READ AND FOLLOW SALTY 6-ATTACHED-1,800mg  daily  PLEASE TAKE AND LOG YOUR BLOOD PRESSURE DAILY AT LEAST 1 HOUR AFTER TAKING YOUR MEDICATION  Follow-Up: Your next appointment:  6-8+ week(s) In Person with Evalina Field, MD     At [facility] you and your health needs are our priority.  As part of our continuing mission to provide you with exceptional heart care, we have created designated Provider Care Teams.  These Care Teams include your primary Cardiologist (physician) and Advanced Practice Providers (APPs -  Physician Assistants and Nurse Practitioners) who all work together to provide you with the care you need, when you need it. Important Information About Sugar             6 SALTY THINGS TO AVOID     1,800MG  DAILY

## 2021-10-26 ENCOUNTER — Ambulatory Visit: Payer: Medicaid Other | Attending: Cardiology

## 2021-10-26 DIAGNOSIS — R55 Syncope and collapse: Secondary | ICD-10-CM | POA: Diagnosis not present

## 2021-10-26 DIAGNOSIS — T671XXA Heat syncope, initial encounter: Secondary | ICD-10-CM

## 2021-10-27 ENCOUNTER — Other Ambulatory Visit: Payer: Self-pay

## 2021-10-31 ENCOUNTER — Telehealth: Payer: Self-pay | Admitting: Licensed Clinical Social Worker

## 2021-10-31 NOTE — Telephone Encounter (Signed)
H&V Care Navigation CSW Progress Note  Clinical Social Worker contacted patient by phone to f/u after appt with Coletta Memos. No answer at [phone]. Voicemail left requesting return call. Will re-attempt as able.   Patient is participating in a Managed Medicaid Plan:  Yes- [employer]  SDOH Screenings   Alcohol Screen: Not on file  Depression (PHQ2-9): Medium Risk (10/23/2021)   Depression (PHQ2-9)    PHQ-2 Score: 10  Financial Resource Strain: Not on file  Food Insecurity: Not on file  Housing: Not on file  Physical Activity: Not on file  Social Connections: Moderately Isolated (09/10/2019)   Social Connection and Isolation Panel [NHANES]    Frequency of Communication with Friends and Family: More than three times a week    Frequency of Social Gatherings with Friends and Family: More than three times a week    Attends Religious Services: Never    Marine scientist or Organizations: No    Attends Music therapist: Not on file    Marital Status: Married  Stress: Not on file  Tobacco Use: High Risk (10/25/2021)   Patient History    Smoking Tobacco Use: Every Day    Smokeless Tobacco Use: Never    Passive Exposure: Not on file  Transportation Needs: No Transportation Needs (09/10/2019)   PRAPARE - Transportation    Lack of Transportation (Medical): No    Lack of Transportation (Non-Medical): No   Westley Hummer, MSW, [place]  [phone]- work cell phone (preferred) [phone]- desk phone

## 2021-11-01 ENCOUNTER — Telehealth: Payer: Self-pay | Admitting: Licensed Clinical Social Worker

## 2021-11-01 NOTE — Progress Notes (Signed)
Heart and Vascular Care Navigation  11/01/2021  Luke [name] [birth year] [id number]  Reason for Referral: possible issues w/ transportation and medications Patient is participating in a Managed Medicaid Plan:Yes- [employer]   Engaged with patient by telephone for initial visit for Heart and Vascular Care Coordination.                                                                                                   Assessment:                         LCSW was able to get pt on phone this morning at [phone]. Introduced self, role, reason for call. I updated pt that his voicemail is not set up but I had tried to reach him tomorrow. LCSW confirmed home address, PCP, and emergency contact remains his wife Luke [name]. They have two children, one still in school and one that graduated last year.  LCSW inquired if pt having any difficulties with rides/medications- pt denies at this time, states there may have been some confusion around this at his appt. He drives to appts, understands if he needs that there are rides available through his [employer] benefits. We also discussed if medications ever become an issue of affordability that he could speak with pharmacy teams about a waiver/charge account since he has Medicaid. Pt denies any additional questions/concerns around cost of living resources. Understands if needed he can call our office with any questions/for resource connection as needed.               HRT/VAS Care Coordination     Patients Home Cardiology Office [place] Team Social Worker   Social Worker Name: Valeda Malm, [place] [facility] [phone]   Living arrangements for the past 2 months Single Family Home   Lives with: Spouse; Adult Children; Minor Children   Patient Current Insurance Coverage Medicaid   Patient Has Concern With Paying Medical Bills No   Does Patient Have Prescription Coverage? Yes   Home Assistive Devices/Equipment None    DME Agency NA       Social History:                                                                             SDOH Screenings   Alcohol Screen: Not on file  Depression (PHQ2-9): Medium Risk (10/23/2021)   Depression (PHQ2-9)    PHQ-2 Score: 10  Financial Resource Strain: Low Risk  (11/01/2021)   Overall Financial Resource Strain (CARDIA)    Difficulty of Paying Living Expenses: Not very hard  Food Insecurity: No Food Insecurity (11/01/2021)   Hunger Vital Sign    Worried About Running Out of Food in the Last Year: Never true    Ran Out  of Food in the Last Year: Never true  Housing: Low Risk  (11/01/2021)   Housing    Last Housing Risk Score: 0  Physical Activity: Not on file  Social Connections: Moderately Isolated (09/10/2019)   Social Connection and Isolation Panel [NHANES]    Frequency of Communication with Friends and Family: More than three times a week    Frequency of Social Gatherings with Friends and Family: More than three times a week    Attends Religious Services: Never    Marine scientist or Organizations: No    Attends Music therapist: Not on file    Marital Status: Married  Stress: Not on file  Tobacco Use: High Risk (10/25/2021)   Patient History    Smoking Tobacco Use: Every Day    Smokeless Tobacco Use: Never    Passive Exposure: Not on file  Transportation Needs: No Transportation Needs (11/01/2021)   PRAPARE - Hydrologist (Medical): No    Lack of Transportation (Non-Medical): No    SDOH Interventions: Financial Resources:  Sales promotion account executive Interventions: Intervention Not Indicated  Food Insecurity:  Food Insecurity Interventions: Intervention Not Indicated  Housing Insecurity:  Housing Interventions: Intervention Not Indicated  Transportation:   Transportation Interventions: Intervention Not Indicated    Other Care Navigation Interventions:     Provided Pharmacy assistance resources  We discussed  utilizing [facility] if needs copay assistance   Follow-up plan:   LCSW mailed the following: my card, flyer from [employer]. I encouraged pt to f/u with me as needed.

## 2021-11-15 ENCOUNTER — Other Ambulatory Visit: Payer: Self-pay

## 2021-11-16 ENCOUNTER — Other Ambulatory Visit: Payer: Self-pay

## 2021-11-17 ENCOUNTER — Other Ambulatory Visit: Payer: Self-pay

## 2021-12-12 ENCOUNTER — Telehealth: Payer: Self-pay

## 2021-12-12 ENCOUNTER — Other Ambulatory Visit (HOSPITAL_COMMUNITY): Payer: Self-pay

## 2021-12-12 NOTE — Telephone Encounter (Signed)
RCID Patient Teacher, English as a foreign language completed.    The patient is insured through [facility] and has a $4.00 copay.  Medication will need a PA.  We will continue to follow to see if copay assistance is needed.  [name], [facility] Patient [facility] for Infectious Disease Phone: [phone] Fax:  [phone]

## 2021-12-13 ENCOUNTER — Encounter: Payer: Self-pay | Admitting: Infectious Diseases

## 2021-12-13 ENCOUNTER — Ambulatory Visit (INDEPENDENT_AMBULATORY_CARE_PROVIDER_SITE_OTHER): Payer: Medicaid Other | Admitting: Infectious Diseases

## 2021-12-13 ENCOUNTER — Other Ambulatory Visit: Payer: Self-pay

## 2021-12-13 VITALS — BP 160/97 | HR 122 | Temp 98.3°F | Ht 69.0 in | Wt 166.0 lb

## 2021-12-13 DIAGNOSIS — E1065 Type 1 diabetes mellitus with hyperglycemia: Secondary | ICD-10-CM | POA: Diagnosis not present

## 2021-12-13 DIAGNOSIS — B182 Chronic viral hepatitis C: Secondary | ICD-10-CM | POA: Diagnosis present

## 2021-12-13 MED ORDER — MAVYRET 100-40 MG PO TABS: 3.0000 | ORAL_TABLET | Freq: Every day | ORAL | 1 refills | Status: DC
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
## 2021-12-13 NOTE — Patient Instructions (Signed)
Nice to see you again!    We need to get a little more information about your hepatitis c infection before we start your treatment. I anticipate that we can get you started in a few weeks after we submit approval to your insurance to ensure payment. We may need to place referral for an ultrasound and/or gastroenterology if your blood work indicates more damage to the liver than expected.     ABOUT HEPATITIS C VIRUS:  Chronic Hepatitis C is the most common blood-borne infection in the [place], affecting approximately 3 million people.  It is the leading cause of cirrhosis, liver cancer, and end stage liver disease requiring transplantation when this infection goes untreated for many years  The majority of people who are infected are unaware because there are not many early symptoms that are specific to this and often go undiagnosed until a specific blood test is drawn.   The hepatitis c virus is passed primarily through direct exposure of contaminated blood or body fluids. It is most efficiently transmitted through repeated exposure to infected blood.  Risk for sexual transmission is very low but is possible if there is high frequency of unprotected sexual activity with known hepatitis c partner or multiple partners of known status.  Over time, approximately 60-70% of people can develop some degree of liver disease. Cirrhosis occurs in 10-20% of those with chronic infection. 1-5% will get liver cancer, which has a very high rate of death.   Approximately 15-25% clear the infection without medication (usually in the first 6 months of becoming exposed to virus)  Newer medications provide over 95% cure rate when taken as prescribed    IN GENERAL ABOUT DIET  Persons living with chronic hepatitis c infection should eat a diet to maintain a healthy weight and avoid nutritional deficiencies.   Completely avoiding alcohol is the best decision for your liver health. If unable to do so please  limit alcohol to as little as possible to less than 1 standard drink a day - this is very irritating to your liver.  Limit tylenol use to less than 2,000 mg daily (two extra strength tablets only twice a day)  If you have cirrhosis of the liver please take no more than 1,000 mg tylenol a day  Patients with cirrhosis should not have protein restriction; we recommend a protein intake of approximately 1.2-1.5 g/kg/day.   For patients with cirrhosis and hepatic encephalopathy, the American Association for the Study of Liver Diseases (AASLD) recommended protein intake is 1.2-1.5 g/kg/day.  If you experience ascites (fluid accumulation in the abdomen associated with severe liver damage / cirrhosis) please limit sodium intake to < 2000 mg a day    UNTIL YOU HAVE BEEN TREATED AND CURED:  Use condoms with all sexual encounters or practice abstinence to avoid sexual transmission   No sharing of razors, toothbrushes, nail clippers or anything that could potentially have blood on it.   If you cut yourself please clean and cover any wounds or open sores to others do not come into contact with your blood.   If blood spills onto item/surface please clean with 1:10 bleach solution and allow to dry, EVEN if it is dried blood.    GENERAL HELPFUL HINTS ON HCV THERAPY:  1. Stay well-hydrated.  2. Notify the ID Clinic of any changes in your other over-the-counter/herbal or prescription medications.  3. If you miss a dose of your medication, take the missed dose as soon as you remember. Return  to your regular time/dose schedule the next day.   4.  Do not stop taking your medications without first talking with your healthcare provider.  5.  You will see our pharmacist-specialist within the first 2 weeks of starting your medication to monitor for any possible side effects.  6.  You will have blood work once during treatment 4 weeks after your first pill. Again soon after treatment is completed and one  final lab 3 months after your last pill to ensure cure!   TIPS TO BE SUCCESSFUL WITH DAILY MEDICATION USE:  1. Set a reminder on your phone  2. Try filling out a pill box for the week - pick a day and put one pill for every day during the week so you know right away if you missed a pill.   3. Have a trusted family member ask you about your medications.   4. Smartphone app    Medication we would like to use for you will be : Veterinary surgeon Instructions:  Take Mavyret, three tablets (at the same time) daily with food. Please take ALL THREE PILLS AT ONCE. You should take it at approximately the same time every day. Treatment will be for 8 weeks. Do not miss a dose.    Do not run out of [place]! If you are down to one week of medication left and have not heard about your next shipment, please let us know as soon as possible. You will be given 28 days of treatment at a time and will receive one refill.   If you need to start a new medication, prescription from your doctor or over the counter medication, you need to contact us to make sure it does not interfere with [place]. There are several medications that can interfere with Mavyret and can make you sick or make the medication not work.  If you need to take a medication for acid reflux, you can take omeprazole 20mg  daily.     Tylenol (acetominophen) and Advil (ibuprofen) are safe to take with Harvoni if needed for headache, fever, pain.   IF YOU ARE ON BIRTH CONTROL PILLS, YOU RECEIVE A SHOT FOR BIRTH CONTROL, OR YOU HAVE AN IUD, please notify your provider to make sure it is safe with Mavyret.   DO NOT stop Mavyret unless instructed to by your provider. If you are hospitalized while taking this medication please bring it with you to the hospital to avoid interruption of therapy. Every pill is important!  The most common side effects associated with Mavyret include:  Fatigue Headache Nausea Diarrhea Insomnia

## 2021-12-14 NOTE — Assessment & Plan Note (Signed)
We had a good discussion that while fatigue is a possible manifestation of chronic hepatitis C viral infection I do worry more that this is caused to prolonged uncontrolled type 1 diabetes.  He has had associated weight loss/muscle wasting that seems more consistent with this catabolic phenomenon of uncontrolled blood glucose.  Hopeful that with better control after meeting with the endocrinology team he will find he feels better.

## 2021-12-14 NOTE — Assessment & Plan Note (Signed)
New Patient with Chronic Hepatitis C genotype unknown, fibrosis risk score with fib 4 is low risk for fibrosis, treatment naive.  Transmission risk was previously related to injection drug use.  I encouraged him to continue abstaining from injection and intranasal drug use to avoid reinfection in the future.  Agree with either strict condoms or abstinence to prevent (low risk) sexual transmission to his wife.  I discussed with the patient the lab findings that confirm chronic hepatitis C as well as the natural history and progression of disease including about 30% of people who develop cirrhosis of the liver if left untreated and once cirrhosis is established there is a 2-7% risk per year of liver cancer and liver failure.  I discussed the importance of treatment and benefits in reducing the risk, even if significant liver fibrosis exists. I also discussed risk for re-infection following treatment should he not continue to modify risk factors.    Patient counseled extensively on limiting acetaminophen to no more than 2 grams daily, avoidance of alcohol.  Transmission discussed with patient including sexual transmission, sharing razors and toothbrush.   Will need referral to gastroenterology if concern for cirrhosis  Will prescribe appropriate medication based on genotype and coverage -plans for Mavyret 3 tabs taken once daily with food.   Hepatitis A and B titers to be drawn today with appropriate vaccinations as needed   Prevnar 20 vaccine at upcoming visit if not previously given  Further work up to include liver staging through non-invasive serum analysis with APRI and FIB4 scores and Liver Fibrosis panel; U/S to follow if discordant or concerning results.  Will call Luke [name] back once all results are in and counsel on medication over the phone. He will return 4 weeks after starting to meet with pharmacy team and check RNA at that time.

## 2021-12-15 ENCOUNTER — Telehealth (HOSPITAL_COMMUNITY): Payer: Self-pay | Admitting: Licensed Clinical Social Worker

## 2021-12-15 NOTE — Telephone Encounter (Signed)
12/15/2021 Name: [name] MRN: [id number] DOB: 06-13-1980  Unsuccessful outbound call made today to assist with:   [facility] CD-IOP referral  Outreach Attempt:  1st Attempt  A HIPAA compliant voice message was left requesting a return call.  Instructed patient to call back at [phone].  SIG

## 2021-12-19 ENCOUNTER — Other Ambulatory Visit: Payer: Medicaid Other

## 2021-12-19 ENCOUNTER — Other Ambulatory Visit: Payer: Self-pay

## 2021-12-19 DIAGNOSIS — B182 Chronic viral hepatitis C: Secondary | ICD-10-CM

## 2021-12-19 NOTE — Progress Notes (Unsigned)
Cardiology Clinic Note   Patient Name: Luke [name] Date of Encounter: 12/25/2021  Primary Care Provider:  Gildardo Pounds, NP Primary Cardiologist:  Evalina Field, MD  Patient Profile    Luke [name] 41 year old male presents the clinic today for follow-up evaluation of his mitral valve endocarditis and mitral valve regurgitation.  Past Medical History    Past Medical History:  Diagnosis Date   Anemia    Anxiety  Dx 2008   Cellulitis    Chronic kidney disease    Diabetes mellitus without complication ([place]) Dx 2229   DKA (diabetic ketoacidoses) 07/21/2017   Endocarditis 04/2019   Endocarditis    GERD (gastroesophageal reflux disease) Dx 2008   Headache(784.0)    Heart murmur    Hepatitis C    Hypertension    Hypothyroidism    MRSA infection    Necrotizing fasciitis of multiple sites ([place])    Pneumonia    Seizures ([place]) 2011   x 2 in lifetime. on Dilantin for a while.    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, multiple relapses   Type 1 diabetes [facility])    Past Surgical History:  Procedure Laterality Date   AMPUTATION Left 09/05/2018   Procedure: LEFT GREAT TOE AMPUTATION;  Surgeon: [name], MD;  Location: [place];  Service: Orthopedics;  Laterality: Left;   APPLICATION OF A-CELL OF EXTREMITY Bilateral 09/01/2019   Procedure: APPLICATION OF MESHED PRIMATRIX AG OF EXTREMITY;  Surgeon: Cindra Presume, MD;  Location: [place];  Service: Plastics;  Laterality: Bilateral;   I & D EXTREMITY Left 10/11/2012   Procedure: IRRIGATION AND DEBRIDEMENT ABSCESS FOREARM;  Surgeon: Linna Hoff, MD;  Location: [place];  Service: Orthopedics;  Laterality: Left;   I & D EXTREMITY Left 10/12/2012   Procedure: IRRIGATION AND DEBRIDEMENT FOREARM;  Surgeon: Linna Hoff, MD;  Location: [place];  Service: Orthopedics;  Laterality: Left;   I & D EXTREMITY Left 10/14/2012   Procedure: incision and drainage left forearm;  Surgeon: Linna Hoff, MD;  Location: [place];   Service: Orthopedics;  Laterality: Left;   I & D EXTREMITY Left 10/16/2012   Procedure: IRRIGATION AND DEBRIDEMENT LEFT FOREARM;  Surgeon: Linna Hoff, MD;  Location: [place];  Service: Orthopedics;  Laterality: Left;   I & D EXTREMITY Left 10/20/2012   Procedure: INCISION AND DRAINAGE AND DEBRIDEMENT LEFT  FOREARM;  Surgeon: Linna Hoff, MD;  Location: [place];  Service: Orthopedics;  Laterality: Left;   I & D EXTREMITY Right 05/22/2019   Procedure: IRRIGATION AND DEBRIDEMENT RIGHT ARM;  Surgeon: [name], MD;  Location: [place];  Service: Orthopedics;  Laterality: Right;   INCISION AND DRAINAGE OF WOUND Bilateral 11/29/2017   Procedure: DEBRIDEMENT BILATERAL FEET, DEBRIDEMENT LEFT ANKLE, AND APPLY WOUND VAC;  Surgeon: [name], MD;  Location: [place];  Service: Orthopedics;  Laterality: Bilateral;   INCISION AND DRAINAGE OF WOUND Bilateral 09/01/2019   Procedure: IRRIGATION AND DEBRIDEMENT FOREARM WOUNDS;  Surgeon: Cindra Presume, MD;  Location: [place];  Service: Plastics;  Laterality: Bilateral;   IRRIGATION AND DEBRIDEMENT ABSCESS     Hx: of left arm abscess related to drug use    TEE WITHOUT CARDIOVERSION N/A 11/28/2017   Procedure: TRANSESOPHAGEAL ECHOCARDIOGRAM (TEE);  Surgeon: Sanda Klein, MD;  Location: [place];  Service: Cardiovascular;  Laterality: N/A;   TEE WITHOUT CARDIOVERSION N/A 05/12/2019   Procedure: TRANSESOPHAGEAL  ECHOCARDIOGRAM (TEE);  Surgeon: Skeet Latch, MD;  Location: [place];  Service: Cardiovascular;  Laterality: N/A;    Allergies  No Known Allergies  History of Present Illness    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  He developed MV endocarditis in February 2021 in the setting of IVDA.  He was deemed not to be a candidate for surgery due to his IVDA.  He was readmitted 6/21 and his MR at that time was severe.  He was seen by Dr. Audie Box 10/30/2019.  During that time he reported he was doing well after  being discharged from the hospital.  His echocardiogram was reviewed.  He was noted to have a large systolic murmur that radiated into his axilla consistent with anterior mitral valve leaflet pathology.  He was tachycardic on exam with a heart rate of 109.  He denied further fever and chills.  He reported he had been off heroin and cocaine.  He reported he had not used any other drugs since being seen in the hospital.  His diabetes was severe and uncontrolled with an A1c of 10.8.  His lipid panel at that time showed an LDL cholesterol of 156.  He reported some swelling in his lower extremities.  He reported that he had previously taken furosemide but had been taken off the medication.  He reported that he was able to walk as far as he wanted however, did have some shortness of breath with increased physical activity.  His carvedilol was continued.  He was started on lisinopril 2.5 mg daily.  He was asked to return to clinic in 6 weeks but was lost to follow-up.  He presented to the clinic 08/15/2020 for follow-up evaluation stated he felt well.  He reported that he had been abstaining from IV drug use for approximately 1 year.  He reported that he did have some increased shortness of breath with increased physical activity.  We discussed his medications and he reported that he had not been taking most of his medications for around 6 months.  We reviewed his echocardiogram and the importance of medication compliance.  He expressed understanding.  I  refilled his carvedilol, amlodipine, gave him the salty 6 diet sheet, provided him with a blood pressure log, and planned follow-up in 1 month.  He presented to the emergency department on 09/19/2021 with near syncope.  A 30-day cardiac event monitor was ordered but has not yet resulted.  He presented to the clinic 10/25/21 for follow-up evaluation and stated he received a cardiac event monitor but did not know how to apply it.  We reviewed his hospitalization and he  expressed understanding.  He had had no further episodes of presyncope.  He reported that he took cocaine yesterday.  His heart rate was 139.  His initial blood pressure was 153/93 and on recheck is 144/86.  He had not been taking his medications for the last 2 months.  Medications were reviewed with clinical pharmacist.  I discontinue his carvedilol and resumed his amlodipine and lisinopril.  PLanned  BMP in 1 week, we applied his cardiac event monitor and planned follow-up after monitor results resulted.  Social work consult to aid in medication compliance.  He presents to the clinic today for follow-up evaluation and states he feels well today.  He reports that he used cocaine about a week and a half ago to 1 week ago.  He has not taken his medications yet today.  He reports that he woke up late.  His blood pressure initially today is 140/86 and on recheck is 142/82.  We reviewed the importance of avoiding recreational drugs, medication compliance, and proper diet.  He has been eating more meals at home and indicates that he has been hydrating well.  His pulse today is 111.  He reports that he is drinking coffee regularly.  I have asked him to reduce his caffeine intake.  We reviewed his echocardiogram and he expressed understanding.  I will continue his current medication regimen, order a BMP today and plan follow-up in 6 months.  Today he denies chest pain, increased shortness of breath, lower extremity edema, fatigue, palpitations, melena, hematuria, hemoptysis, diaphoresis, weakness, presyncope, syncope, orthopnea, and PND.       Home Medications    Prior to Admission medications   Medication Sig Start Date End Date Taking? Authorizing Provider  Accu-Chek Softclix Lancets lancets USE TO CHECK BLOOD SUGAR THREE TIMES DAILY E11.65 01/14/20 01/13/21  Charlott Rakes, MD  albuterol (VENTOLIN HFA) 108 (90 Base) MCG/ACT inhaler Inhale 2 puffs into the lungs every 6 (six) hours as needed for wheezing  or shortness of breath. 09/18/19   Luke Pounds, NP  amLODipine (NORVASC) 5 MG tablet Take 5 mg by mouth daily.    [provider]  amoxicillin-clavulanate (AUGMENTIN) 875-125 MG tablet Take 1 tablet by mouth every 12 (twelve) hours. 04/10/20   Hazel Sams, PA-C  aspirin EC 81 MG tablet Take 1 tablet (81 mg total) by mouth daily. Swallow whole. 10/30/19   O'NealCassie Freer, MD  atorvastatin (LIPITOR) 40 MG tablet Take 1 tablet (40 mg total) by mouth daily. 06/12/20 09/10/20  Luke Pounds, NP  Bismuth Tribromoph-Petrolatum (XEROFORM PETROLATUM ROLL 4"X9') MISC Apply topically to affected area as prescribed 09/18/19   Luke Pounds, NP  Blood Glucose Monitoring Suppl (ACCU-CHEK GUIDE ME) w/Device KIT Use to check blood sugar TID. E11.65 01/14/20   Charlott Rakes, MD  Blood Glucose Monitoring Suppl (ACCU-CHEK GUIDE) w/Device KIT USE TO CHECK BLOOD SUGAR THREE TIMES DAILY 01/14/20 01/13/21  Charlott Rakes, MD  carvedilol (COREG) 6.25 MG tablet Take 1 tablet (6.25 mg total) by mouth 2 (two) times daily with a meal. 09/18/19 12/17/19  Luke Pounds, NP  DULoxetine (CYMBALTA) 30 MG capsule Take 1 capsule (30 mg total) by mouth daily. 09/18/19 12/17/19  Luke Pounds, NP  ferrous sulfate 325 (65 FE) MG tablet Take 1 tablet (325 mg total) by mouth daily with breakfast. 09/18/19   Luke Pounds, NP  furosemide (LASIX) 20 MG tablet Take 1 tablet (20 mg total) by mouth daily for 14 days. 10/30/19 11/18/19  O'NealCassie Freer, MD  Gauze Pads & Dressings ([place]) MISC Apply to affected area as prescribed 09/18/19   Luke Pounds, NP  glucose blood test strip USE TO CHECK BLOOD SUGAR THREE TIMES DAILY E11.65 01/14/20 01/13/21  Charlott Rakes, MD  insulin glargine (LANTUS SOLOSTAR) 100 UNIT/ML Solostar Pen Inject 40 Units into the skin 2 (two) times daily. 06/23/20   Charlott Rakes, MD  insulin lispro (HUMALOG) 100 UNIT/ML KwikPen INJECT 4-20 UNITS INTO THE SKIN 3 (THREE) TIMES  DAILY. PER SLIDING SCALE Patient taking differently: Inject 5 Units into the skin See admin instructions. Take 5 units at meal time and sliding scale through out the day Above 200  Take 2 units up to 400 units take 14 units 09/18/19 09/17/20  Luke Pounds, NP  Insulin Pen  Needle (TRUEPLUS 5-BEVEL PEN NEEDLES) 32G X 4 MM MISC Use as instructed to inject insulin. 11/27/19   Charlott Rakes, MD  Insulin Pen Needle 31G X 5 MM MISC USE AS INSTRUCTED TO INJECT INSULIN. 11/27/19 11/26/20  Charlott Rakes, MD  levothyroxine (SYNTHROID) 50 MCG tablet Take 1 tablet (50 mcg total) by mouth daily. 09/18/19   Luke Pounds, NP  lidocaine (XYLOCAINE) 2 % solution Use as directed 15 mLs in the mouth or throat as needed for mouth pain. 04/10/20   Hazel Sams, PA-C  lisinopril (ZESTRIL) 2.5 MG tablet Take 1 tablet (2.5 mg total) by mouth daily. 10/30/19 01/28/20  Geralynn Rile, MD  Melatonin 5 MG CHEW Chew 5 mg by mouth at bedtime.    [provider]  omeprazole (PRILOSEC) 20 MG capsule Take 1 capsule (20 mg total) by mouth daily. 09/18/19 12/17/19  Luke Pounds, NP  sertraline (ZOLOFT) 50 MG tablet Take 1 tablet (50 mg total) by mouth daily. 09/18/19   Luke Pounds, NP  sodium polystyrene (KAYEXALATE) 15 GM/60ML suspension TAKE 60 MLS (15 G TOTAL) BY MOUTH ONCE FOR 1 DOSE. 03/29/20 03/29/21  Charlott Rakes, MD  sodium polystyrene (KAYEXALATE) 15 GM/60ML suspension TAKE 120 MLS (30 G TOTAL) BY MOUTH ONCE FOR 1 DOSE. 03/22/20 03/22/21  Luke Pounds, NP  traZODone (DESYREL) 50 MG tablet Take 1 tablet (50 mg total) by mouth at bedtime. 09/18/19   Luke Pounds, NP    Family History    Family History  Problem Relation Age of Onset   Diabetes Father    Heart disease Father    Mental illness Sister    Cancer Neg Hx    He indicated that his mother is alive. He indicated that his father is deceased. He indicated that his sister is alive. He indicated that his brother is alive. He indicated that  the status of his neg hx is unknown.  Social History    Social History   Socioeconomic History   Marital status: Married    Spouse name: Not on file   Number of children: 6   Years of education: some colle   Highest education level: Not on file  Occupational History   Occupation: Event set up    Employer: [place]    Occupation: Stencil for cars   Tobacco Use   Smoking status: Every Day    Packs/day: 1.00    Years: 25.00    Total pack years: 25.00    Types: Cigarettes   Smokeless tobacco: Never   Tobacco comments:    decreased over the last few weeks because he has not been breathing well  Vaping Use   Vaping Use: Some days  Substance and Sexual Activity   Alcohol use: No    Alcohol/week: 0.0 standard drinks of alcohol    Comment: rarely   Drug use: Not Currently    Types: Cocaine, IV, Heroin    Comment: IVDU since Dad died in 05-07-06 with 15 month period of sobriety 2018-2019, relapse Aug 2019.   Sexual activity: Yes    Partners: Female    Birth control/protection: None  Other Topics Concern   Not on file  Social History Narrative   Lives with wife and two children age 46 and 71.          Social Determinants of Health   Financial Resource Strain: Low Risk  (11/01/2021)   Overall Financial Resource Strain (CARDIA)  Difficulty of Paying Living Expenses: Not very hard  Food Insecurity: No Food Insecurity (11/01/2021)   Hunger Vital Sign    Worried About Running Out of Food in the Last Year: Never true    Ran Out of Food in the Last Year: Never true  Transportation Needs: No Transportation Needs (11/01/2021)   PRAPARE - Hydrologist (Medical): No    Lack of Transportation (Non-Medical): No  Physical Activity: Not on file  Stress: Not on file  Social Connections: Moderately Isolated (09/10/2019)   Social Connection and Isolation Panel [NHANES]    Frequency of Communication with Friends and Family: More than  three times a week    Frequency of Social Gatherings with Friends and Family: More than three times a week    Attends Religious Services: Never    Marine scientist or Organizations: No    Attends Music therapist: Not on file    Marital Status: Married  Human resources officer Violence: Not on file     Review of Systems    General:  No chills, fever, night sweats or weight changes.  Cardiovascular:  No chest pain, dyspnea on exertion, edema, orthopnea, palpitations, paroxysmal nocturnal dyspnea. Dermatological: No rash, lesions/masses Respiratory: No cough, dyspnea Urologic: No hematuria, dysuria Abdominal:   No nausea, vomiting, diarrhea, bright red blood per rectum, melena, or hematemesis Neurologic:  No visual changes, wkns, changes in mental status. All other systems reviewed and are otherwise negative except as noted above.  Physical Exam    VS:  BP (!) 142/82   Pulse (!) 111   Ht _0  (1.753 m)   Wt 174 lb (78.9 kg)   SpO2 95%   BMI 25.70 kg/m  , BMI Body mass index is 25.7 kg/m. GEN: Well nourished, well developed, in no acute distress. HEENT: normal. Neck: Supple, no JVD, carotid bruits, or masses. Cardiac: RRR, no murmurs, rubs, or gallops. No clubbing, cyanosis, edema.  Radials/DP/PT 2+ and equal bilaterally.  Respiratory:  Respirations regular and unlabored, clear to auscultation bilaterally. GI: Soft, nontender, nondistended, BS + x 4. MS: no deformity or atrophy. Skin: warm and dry, no rash. Neuro:  Strength and sensation are intact. Psych: Normal affect.  Accessory Clinical Findings    Recent Labs: 09/19/2021: B Natriuretic Peptide 51.2 09/20/2021: Magnesium 1.9 10/23/2021: BUN 20; Creatinine, Ser 1.48; Hemoglobin 14.2; Platelets 435; Potassium 4.6; Sodium 139 12/19/2021: ALT 46   Recent Lipid Panel    Component Value Date/Time   CHOL 244 (H) 03/04/2020 1544   CHOL 150 04/11/2014 0054   TRIG 134 10/04/2020 0224   TRIG 121 04/11/2014 0054    HDL 60 03/04/2020 1544   HDL 46 04/11/2014 0054   CHOLHDL 4.1 03/04/2020 1544   CHOLHDL 6.6 10/21/2017 1508   VLDL 45 (H) 10/21/2017 1508   VLDL 24 04/11/2014 0054   LDLCALC 157 (H) 03/04/2020 1544   [place] 80 04/11/2014 0054    ECG personally reviewed by me today-none today.  EKG 07/19/20 sinus tachycardia possible left atrial enlargement left ventricular hypertrophy 125 bpm  Echocardiogram 08/28/2019 1. Mitral and pulmonic valve vegetations. Endocarditis.   2. Left ventricular ejection fraction, by estimation, is 70 to 75%. The  left ventricle has hyperdynamic function. The left ventricle has no  regional wall motion abnormalities. Left ventricular diastolic parameters  were normal.   3. Right ventricular systolic function is normal. The right ventricular  size is normal.   4. There is a anterior mitral  leaflet valvular vegetation (1.1 x 0.6 cm).  The mitral valve is abnormal. Moderate mitral valve regurgitation. No  evidence of mitral stenosis.   5. The aortic valve is normal in structure. Aortic valve regurgitation is  mild. No aortic stenosis is present.   6. Possible vegetation on pulmonic valve. The pulmonic valve was  abnormal.   7. The inferior vena cava is normal in size with greater than 50%  respiratory variability, suggesting right atrial pressure of 3 mmHg.   Cardiac event monitor 10/26/2021  Enrollment 10/26/2021-11/24/2021 (30 days; Wear period 21 d 38 min). Minimum heart rate 73 bpm (normal sinus rhythm). Maximum heart rate 167 bpm (sinus tachycardia). Average heart rate 100 bpm (sinus tachycardia). Supraventricular ectopy was rare (<1%). Ventricular ectopy was rare (<1%). No atrial fibrillation detected.    Impression: No arrhythmias detected.  Rare ectopy.    [place] T. Audie Box, MD, [place]  [address], [place], [place] [address] [phone]  8:53 PM  Assessment & Plan   1.  Near syncope-no further episodes.     Presented to the emergency department on 09/19/2021 and was discharged on 09/20/2021.  He reported that he had been sitting outside on his patio for about an hour and suddenly woke up on the ground.  He reported several episodes of diarrhea the previous day and a few shots of alcohol.  He did not remember falling and believes he had passed out.  EMS was called.  When EMS was called it was felt that his EKG showed STEMI.  He denied chest pain.  His EKG was reviewed by cardiology and was not felt to have ST elevation.  A 30-day cardiac event monitor was ordered.  Lab work from the ED showed elevated glucose, stable creatinine and sodium of 134.  Lab work 10/23/2021 showed improved blood glucose, stable creatinine and electrolytes, stable H&H.  His cardiac event monitor showed no arrhythmias and rare extra beats. Continue to maintain p.o. hydration, move from sitting to standing slowly Avoid recreational drugs-reviewed May wear lower extremity support stockings Order BMP   Essential hypertension-BP today 140/86 .  Did not take blood pressure medications this morning.  We discussed the importance of medication compliance.  Continue amlodipine,lisinopril,  Heart healthy low-sodium diet Increase physical activity as tolerated Maintain blood pressure log   Mitral valve endocarditis/severe mitral regurgitation-stable systolic  murmur that radiates to his axillary region.  Previously suspected disruption of the anterior mitral valve leaflet resulting in eccentric jet in the posterior direction.  Not a candidate for surgery due to IVDA.  Reports cessation of IV drug use.  UDS 09/20/21 positive for cocaine and THC. Continue lisinopril, amlodipine Heart healthy low-sodium diet-salty 6 reviewed Increase physical activity as tolerated  Cocaine use-reports he used cocaine in the last week to week and a half.  I stressed importance of avoiding recreational drugs the impact that it could have on his health related to his  other medications.  He expressed understanding.  Disposition: Follow-up with Dr. Audie Box or me in 6 months   Jossie Ng. Anquanette Bahner NP-C    12/25/2021, 3:49 PM [address] [place] [address] Office [phone] Fax [phone]  Notice: This dictation was prepared with Dragon dictation along with smaller phrase technology. Any transcriptional errors that result from this process are unintentional and may not be corrected upon review.  I spent 13 minutes examining this patient, reviewing medications, and using patient centered shared decision making  involving her cardiac care.  Prior to her visit I spent greater than 20 minutes reviewing her past medical history,  medications, and prior cardiac tests.

## 2021-12-25 ENCOUNTER — Telehealth: Payer: Self-pay

## 2021-12-25 ENCOUNTER — Encounter: Payer: Self-pay | Admitting: General Practice

## 2021-12-25 ENCOUNTER — Other Ambulatory Visit (HOSPITAL_COMMUNITY): Payer: Self-pay

## 2021-12-25 ENCOUNTER — Ambulatory Visit: Payer: Medicaid Other | Attending: General Practice | Admitting: General Practice

## 2021-12-25 VITALS — BP 142/82 | HR 111 | Ht 69.0 in | Wt 174.0 lb

## 2021-12-25 DIAGNOSIS — I10 Essential (primary) hypertension: Secondary | ICD-10-CM

## 2021-12-25 DIAGNOSIS — R55 Syncope and collapse: Secondary | ICD-10-CM

## 2021-12-25 DIAGNOSIS — I340 Nonrheumatic mitral (valve) insufficiency: Secondary | ICD-10-CM | POA: Diagnosis not present

## 2021-12-25 LAB — HEPATITIS C GENOTYPE

## 2021-12-25 LAB — HEPATITIS B SURFACE ANTIGEN: Hepatitis B Surface Ag: NONREACTIVE

## 2021-12-25 LAB — LIVER FIBROSIS, FIBROTEST-ACTITEST
ALT: 46 U/L (ref 9–46)
Alpha-2-Macroglobulin: 268 mg/dL (ref 106–279)
Apolipoprotein A1: 220 mg/dL — ABNORMAL HIGH (ref 94–176)
Bilirubin: 0.6 mg/dL (ref 0.2–1.2)
Fibrosis Score: 0.2
GGT: 80 U/L (ref 3–95)
Haptoglobin: 170 mg/dL (ref 43–212)
Necroinflammat ACT Score: 0.23
Reference ID: 4582068

## 2021-12-25 LAB — HEPATITIS B SURFACE ANTIBODY,QUALITATIVE: Hep B S Ab: NONREACTIVE

## 2021-12-25 NOTE — Progress Notes (Signed)
I will start PA today for [place].

## 2021-12-25 NOTE — Telephone Encounter (Signed)
[facility] Patient Advocate Encounter   Received notification from [facility] that prior authorization for Luke [name] is required.   PA submitted on 12/25/21 Key [id number] Status is pending    [facility] will continue to follow.   [name], [facility] Patient [facility] Phone: [phone] Fax:  [phone]

## 2021-12-25 NOTE — Patient Instructions (Signed)
Medication Instructions:  The current medical regimen is effective;  continue present plan and medications as directed. Please refer to the Current Medication list given to you today.   *If you need a refill on your cardiac medications before your next appointment, please call your pharmacy*  Lab Work: BMET TODAY If you have labs (blood work) drawn today and your tests are completely normal, you will receive your results only by:  [place] (if you have MyChart) OR  A paper copy in the mail  If you have any lab test that is abnormal or we need to change your treatment, we will call you to review the results.  OTHER: INCREASE PHYSICAL ACTIVITY AS TOLERATED  MAINTAIN YOUR CURRENT DIET  Follow-Up: At [facility], you and your health needs are our priority.  As part of our continuing mission to provide you with exceptional heart care, we have created designated Provider Care Teams.  These Care Teams include your primary Cardiologist (physician) and Advanced Practice Providers (APPs -  Physician Assistants and Nurse Practitioners) who all work together to provide you with the care you need, when you need it.  Your next appointment:   6 month(s)  The format for your next appointment:   In Person  Provider:   Evalina Field, MD    Important Information About Sugar

## 2021-12-26 ENCOUNTER — Telehealth: Payer: Self-pay | Admitting: Infectious Diseases

## 2021-12-26 ENCOUNTER — Telehealth: Payer: Self-pay

## 2021-12-26 LAB — BASIC METABOLIC PANEL
BUN/Creatinine Ratio: 11 (ref 9–20)
BUN: 16 mg/dL (ref 6–24)
CO2: 23 mmol/L (ref 20–29)
Calcium: 9.6 mg/dL (ref 8.7–10.2)
Chloride: 103 mmol/L (ref 96–106)
Creatinine, Ser: 1.44 mg/dL — ABNORMAL HIGH (ref 0.76–1.27)
Glucose: 183 mg/dL — ABNORMAL HIGH (ref 70–99)
Potassium: 4.8 mmol/L (ref 3.5–5.2)
Sodium: 141 mmol/L (ref 134–144)
eGFR: 63 mL/min/{1.73_m2} (ref >59)

## 2021-12-26 MED ORDER — MAVYRET 100-40 MG PO TABS: 3.0000 | ORAL_TABLET | Freq: Every day | ORAL | 1 refills | Status: DC

## 2021-12-26 NOTE — Telephone Encounter (Signed)
-----   Message from Roney Jaffe, CPhT sent at 12/25/2021  4:50 PM EDT ----- Regarding: Sandra Cockayne,  PA was denied for [facility], insurance said patient will need to have tried and failed Mavyret or Epclusa before taking Harvoni. I can start a PA on one of the other's if you like?  Thank You,  [name], [place] Patient [facility] for Infectious Disease Phone: [phone] Fax: [phone]

## 2021-12-26 NOTE — Telephone Encounter (Signed)
I will start the PA for Mavyret

## 2021-12-26 NOTE — Telephone Encounter (Signed)
[facility] Patient Advocate Encounter  Prior Authorization for Mavyret has been approved.    PA# [id number] Effective dates: 12/26/21 through 02/20/22  Patients co-pay is $4.00.   Prescription can be filled at [facility].  [facility] Clinic will continue to follow.  [name], [facility] Patient [facility] for Infectious Disease Phone: [phone] Fax:  [phone]

## 2021-12-26 NOTE — Telephone Encounter (Signed)
[facility] Patient Advocate Encounter   Received notification from [facility] that prior authorization for Mavyret is required.   PA submitted on 12/26/21 Key [id number] Status is pending   Harvoni was denied due to patient will need to have tried and failed Mavyret or Epclusa .    [facility] Clinic will continue to follow.   [name], [facility] Patient [facility] for Infectious Disease Phone: [phone] Fax:  [phone]

## 2021-12-26 NOTE — Telephone Encounter (Signed)
Reviewed meds - no interactions with mavyret with [place] app.   Would do Mavyret x 8 weeks for treatment since harvoni denied.  Thanks Butch Penny.

## 2021-12-28 ENCOUNTER — Other Ambulatory Visit: Payer: Self-pay | Admitting: Pharmacist

## 2021-12-28 ENCOUNTER — Other Ambulatory Visit (HOSPITAL_COMMUNITY): Payer: Self-pay

## 2021-12-28 DIAGNOSIS — B182 Chronic viral hepatitis C: Secondary | ICD-10-CM

## 2021-12-28 MED ORDER — MAVYRET 100-40 MG PO TABS
3.0000 | ORAL_TABLET | Freq: Every day | ORAL | 1 refills | Status: DC
  Filled 2021-12-28: qty 84, fill #0
  Filled 2021-12-29: qty 84, 28d supply, fill #0
  Filled 2022-01-22: qty 84, 28d supply, fill #1

## 2021-12-29 ENCOUNTER — Other Ambulatory Visit (HOSPITAL_COMMUNITY): Payer: Self-pay

## 2021-12-29 ENCOUNTER — Telehealth: Payer: Self-pay | Admitting: Pharmacist

## 2021-12-29 NOTE — Telephone Encounter (Signed)
Patient is approved to receive Mavyret x 8 weeks for chronic Hepatitis C infection. Counseled patient to take all three tablets of Mavyret daily with food.  Counseled patient the need to take all three tablets together and to not separate them out during the day. Encouraged patient not to miss any doses and explained how their chance of cure could go down with each dose missed. Counseled patient on what to do if dose is missed - if it is closer to the missed dose take immediately; if closer to next dose then skip dose and take the next dose at the usual time. Counseled patient on common side effects such as headache, fatigue, and nausea and that these normally decrease with time. I reviewed patient medications and found no drug interactions. Discussed with patient that there are several drug interactions with Mavyret and instructed patient to call the clinic if he wishes to start a new medication during course of therapy. Also advised patient to call if he experiences any side effects. Patient will follow-up with me in the pharmacy clinic on 11/14.  Alfonse Spruce, PharmD, CPP, [employer] Clinical Pharmacist Practitioner Infectious [place] for Infectious Disease

## 2022-01-09 ENCOUNTER — Other Ambulatory Visit: Payer: Self-pay

## 2022-01-10 ENCOUNTER — Other Ambulatory Visit: Payer: Self-pay

## 2022-01-17 ENCOUNTER — Other Ambulatory Visit: Payer: Self-pay

## 2022-01-22 ENCOUNTER — Ambulatory Visit: Payer: Medicaid Other | Admitting: Internal Medicine

## 2022-01-22 ENCOUNTER — Other Ambulatory Visit (HOSPITAL_COMMUNITY): Payer: Self-pay

## 2022-01-24 ENCOUNTER — Ambulatory Visit: Payer: Medicaid Other | Admitting: Nurse Practitioner

## 2022-01-25 ENCOUNTER — Other Ambulatory Visit (HOSPITAL_COMMUNITY): Payer: Self-pay

## 2022-01-25 ENCOUNTER — Other Ambulatory Visit: Payer: Self-pay

## 2022-01-26 ENCOUNTER — Other Ambulatory Visit: Payer: Self-pay

## 2022-01-28 ENCOUNTER — Other Ambulatory Visit: Payer: Self-pay | Admitting: Pharmacist

## 2022-01-28 DIAGNOSIS — B182 Chronic viral hepatitis C: Secondary | ICD-10-CM

## 2022-01-29 ENCOUNTER — Other Ambulatory Visit (HOSPITAL_COMMUNITY): Payer: Self-pay

## 2022-01-30 ENCOUNTER — Ambulatory Visit: Payer: Medicaid Other | Admitting: Pharmacist

## 2022-01-30 ENCOUNTER — Other Ambulatory Visit: Payer: Self-pay

## 2022-02-01 ENCOUNTER — Other Ambulatory Visit: Payer: Self-pay

## 2022-02-01 NOTE — Progress Notes (Signed)
HPI: Luke [name] is a 41 y.o. male who presents to the [place] clinic for Hepatitis C follow-up.  Medication: Mavyret  Start Date: 12/29/2021  Hepatitis C Genotype: 1a  Fibrosis Score: F0  Hepatitis C RNA: 27,100,000 on 09/12/2021  Patient Active Problem List   Diagnosis Date Noted   Syncope 09/19/2021   High anion gap metabolic acidosis 25/85/2778   Diarrhea 09/19/2021   Uncontrolled type 1 diabetes mellitus with hyperglycemia, with long-term current use of insulin ([place]) 09/19/2021   Moderate episode of recurrent major depressive disorder ([place]) 05/02/2021   Chronic kidney disease, stage IV (severe) ([place]) 05/02/2021   Substance induced mood disorder ([place]) 01/21/2021   Opioid use disorder, moderate, in early remission, on maintenance therapy, dependence ([place]) 01/21/2021   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   Congestive heart failure (CHF) ([place]) 08/27/2019   Chest pain 08/20/2019   Elevated troponin 08/20/2019   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   Heroin abuse ([place]) 05/09/2019   Hypothyroidism    Hepatitis C 11/25/2017   GERD (gastroesophageal reflux disease) 07/21/2017   Diabetic polyneuropathy associated with type 1 diabetes mellitus ([place])    Noncompliant bladder    Urinary retention    ARF (acute renal failure) ([place]) 12/16/2016   Anemia 12/16/2016   Cocaine abuse ([place]) 08/16/2015   Type 1 diabetes mellitus without complication ([place]) 24/23/5361   Cellulitis 12/03/2014   AKI (acute kidney injury) ([place]) 12/03/2014   Elevated liver enzymes 07/26/2014   CKD (chronic kidney disease) stage 3, GFR 30-59 ml/min ([name]) 10/14/2012   Leukocytosis 10/14/2012   Polysubstance abuse ([place]) 10/11/2012   Tobacco abuse 10/11/2012   Essential hypertension 10/11/2012    Patient's Medications  New Prescriptions   No medications on file  Previous Medications   ACCU-CHEK SOFTCLIX LANCETS LANCETS    use 1 lancet to check blood glucose 5 times per day   AMLODIPINE  (NORVASC) 10 MG TABLET    Take 0.5 tablets (5 mg total) by mouth daily.   BLOOD GLUCOSE MONITORING SUPPL (ACCU-CHEK GUIDE ME) W/DEVICE KIT    Use to check blood sugar TID. E11.65   CONTINUOUS BLOOD GLUC RECEIVER (FREESTYLE LIBRE 14 DAY READER) DEVI    Use as instructed. Check blood glucose level by fingerstick 5 times per day. E11.65 Z79.4   CONTINUOUS BLOOD GLUC RECEIVER (FREESTYLE LIBRE 2 READER) DEVI    Use as instructed. Check blood glucose level by fingerstick 5 times per day. E11.65 Z79.4   CONTINUOUS BLOOD GLUC SENSOR (FREESTYLE LIBRE 2 SENSOR) MISC    Use as instructed. Check blood glucose level 5 times per day.   GLECAPREVIR-PIBRENTASVIR (MAVYRET) 100-40 MG TABS    Take 3 tablets by mouth daily with breakfast.   GLUCOSE BLOOD TEST STRIP    Use as directed to check blood glucose 5 times per day   INSULIN ASPART PROTAMINE - ASPART (NOVOLOG MIX 70/30 FLEXPEN) (70-30) 100 UNIT/ML FLEXPEN    Inject 45 Units into the skin 2 (two) times daily with a meal.   INSULIN PEN NEEDLE (TRUEPLUS 5-BEVEL PEN NEEDLES) 32G X 4 MM MISC    Use as instructed to inject insulin.   LISINOPRIL (ZESTRIL) 5 MG TABLET    Take 1 tablet (5 mg total) by mouth daily.   SILDENAFIL (VIAGRA) 100 MG TABLET    Take 0.5-1 tablets (50-100 mg total) by mouth daily as needed for erectile dysfunction.  Modified Medications   No medications on file  Discontinued Medications   No medications  on file    Allergies: No Known Allergies  Past Medical History: Past Medical History:  Diagnosis Date   Anemia    Anxiety  Dx May 22, 2006   Cellulitis    Chronic kidney disease    Diabetes mellitus without complication ([place]) Dx 2094   DKA (diabetic ketoacidoses) 07/21/2017   Endocarditis 2019/05/23   Endocarditis    GERD (gastroesophageal reflux disease) Dx 05/22/2006   Headache(784.0)    Heart murmur    Hepatitis C    Hypertension    Hypothyroidism    MRSA infection    Necrotizing fasciitis of multiple sites ([place])    Pneumonia    Seizures  ([place]) 05/22/09   x 2 in lifetime. on Dilantin for a while.    Sepsis ([place])    Substance abuse ([place]) 05-23-11   heroin use, multiple relapses   Type 1 diabetes ([place])     Social History: Social History   Socioeconomic History   Marital status: Married    Spouse name: Not on file   Number of children: 6   Years of education: some colle   Highest education level: Not on file  Occupational History   Occupation: Event set up    Employer: [employer]    Occupation: Stencil for cars   Tobacco Use   Smoking status: Every Day    Packs/day: 1.00    Years: 25.00    Total pack years: 25.00    Types: Cigarettes   Smokeless tobacco: Never   Tobacco comments:    decreased over the last few weeks because he has not been breathing well  Vaping Use   Vaping Use: Some days  Substance and Sexual Activity   Alcohol use: No    Alcohol/week: 0.0 standard drinks of alcohol    Comment: rarely   Drug use: Not Currently    Types: Cocaine, IV, Heroin    Comment: IVDU since Dad died in 05/22/2006 with 15 month period of sobriety 2018-2019, relapse Aug 2019.   Sexual activity: Yes    Partners: Female    Birth control/protection: None  Other Topics Concern   Not on file  Social History Narrative   Lives with wife and two children age 79 and 60.          Social Determinants of Health   Financial Resource Strain: Low Risk  (11/01/2021)   Overall Financial Resource Strain (CARDIA)    Difficulty of Paying Living Expenses: Not very hard  Food Insecurity: No Food Insecurity (11/01/2021)   Hunger Vital Sign    Worried About Running Out of Food in the Last Year: Never true    Ran Out of Food in the Last Year: Never true  Transportation Needs: No Transportation Needs (11/01/2021)   PRAPARE - Hydrologist (Medical): No    Lack of Transportation (Non-Medical): No  Physical Activity: Not on file  Stress: Not on file  Social Connections: Moderately Isolated  (09/10/2019)   Social Connection and Isolation Panel [NHANES]    Frequency of Communication with Friends and Family: More than three times a week    Frequency of Social Gatherings with Friends and Family: More than three times a week    Attends Religious Services: Never    Marine scientist or Organizations: No    Attends Music therapist: Not on file    Marital Status: Married    Labs: Hepatitis C Lab Results  Component Value  Date   HCVGENOTYPE 1a 12/19/2021   FIBROSTAGE F0 12/19/2021   Hepatitis B Lab Results  Component Value Date   HEPBSAB NON-REACTIVE 12/19/2021   HEPBSAG NON-REACTIVE 12/19/2021   Hepatitis A Lab Results  Component Value Date   HAV Negative 11/29/2017   HIV Lab Results  Component Value Date   HIV Non Reactive 10/02/2020   HIV Non Reactive 08/27/2019   HIV NON REACTIVE 03/19/2019   HIV Non Reactive 07/21/2017   HIV Non Reactive 12/17/2016   Lab Results  Component Value Date   CREATININE 1.44 (H) 12/25/2021   CREATININE 1.48 (H) 10/23/2021   CREATININE 1.41 (H) 09/20/2021   CREATININE 1.77 (H) 09/19/2021   CREATININE 1.71 (H) 09/19/2021   Lab Results  Component Value Date   AST 35 10/23/2021   AST 41 09/20/2021   AST 23 01/20/2021   ALT 46 12/19/2021   ALT 42 10/23/2021   ALT 42 09/20/2021   INR 1.0 08/19/2019   INR 0.9 05/19/2019   INR 1.0 05/08/2019    Assessment: Ettore Trebilcock is a chronic Hep C patient here for a 63-monthfollow up after starting [place] He reports mild diarrhea in the first week but has subsided since. He states feeling a lot better since starting treatment. He takes all 3 pills at once every day and has received his next box after finishing his first box Sunday. We explained the follow up with the HCV RNA lab and that we expect it to be much lower with today's collection. We explained we would call him if there were any issues. We also explained the upcoming visits to monitor treatment progress and  future goals. After discussing risks for reinfection of HCV, he says he has been clean of IV drug use for past 3 years.  He is eligible for the Prevnar20, Flu, COVID, Hep B and Hep A vaccines. After discussing risks and benefits, he deferred all of these at this time. He was offered STI screening but deferred that at this time.   Plan: - Collect HCV RNA - Follow up on 03/07/2022 at 9:15 AM [place]

## 2022-02-02 ENCOUNTER — Other Ambulatory Visit: Payer: Self-pay

## 2022-02-02 ENCOUNTER — Ambulatory Visit (INDEPENDENT_AMBULATORY_CARE_PROVIDER_SITE_OTHER): Payer: Medicaid Other | Admitting: Pharmacist

## 2022-02-02 DIAGNOSIS — B182 Chronic viral hepatitis C: Secondary | ICD-10-CM

## 2022-02-05 LAB — HEPATITIS C RNA QUANTITATIVE
HCV Quantitative Log: <1.18 log IU/mL
HCV RNA, PCR, QN: <15 IU/mL

## 2022-02-14 ENCOUNTER — Other Ambulatory Visit (HOSPITAL_COMMUNITY): Payer: Self-pay

## 2022-02-23 ENCOUNTER — Other Ambulatory Visit: Payer: Self-pay | Admitting: Family Medicine

## 2022-02-23 ENCOUNTER — Other Ambulatory Visit: Payer: Self-pay

## 2022-02-25 MED ORDER — NOVOLOG FLEXPEN 100 UNIT/ML ~~LOC~~ SOPN
2.0000 [IU] | PEN_INJECTOR | Freq: Three times a day (TID) | SUBCUTANEOUS | 0 refills | Status: AC
  Filled 2022-02-25: qty 9, 37d supply, fill #0

## 2022-02-26 ENCOUNTER — Other Ambulatory Visit: Payer: Self-pay

## 2022-02-28 ENCOUNTER — Other Ambulatory Visit: Payer: Self-pay

## 2022-03-01 ENCOUNTER — Other Ambulatory Visit: Payer: Self-pay | Admitting: Nurse Practitioner

## 2022-03-01 ENCOUNTER — Other Ambulatory Visit: Payer: Self-pay

## 2022-03-01 DIAGNOSIS — E1059 Type 1 diabetes mellitus with other circulatory complications: Secondary | ICD-10-CM

## 2022-03-01 MED ORDER — ACCU-CHEK GUIDE VI STRP
ORAL_STRIP | 1 refills | Status: AC
  Filled 2022-03-01: qty 150, 30d supply, fill #0

## 2022-03-01 NOTE — Telephone Encounter (Signed)
Requested Prescriptions  Pending Prescriptions Disp Refills   glucose blood (ACCU-CHEK GUIDE) test strip 200 each 1    Sig: Use as directed to check blood glucose 5 times per day     Endocrinology: Diabetes - Testing Supplies Passed - 03/01/2022  3:14 PM      Passed - Valid encounter within last 12 months    Recent Outpatient Visits           4 months ago Hospital discharge follow-up   [place], [place] W, NP   10 months ago Type 1 diabetes mellitus with other circulatory complication [facility])   [place], Zelda W, NP   1 year ago Hospital discharge follow-up   [place], Zelda W, NP   1 year ago Hospital discharge follow-up   [place], Zelda W, NP   1 year ago Insulin dependent type 2 diabetes mellitus [facility])   [place], [name], NP       Future Appointments             In 2 weeks Gildardo Pounds, NP [place]   In 5 months Shamleffer, Melanie Crazier, MD [facility] Endocrinology

## 2022-03-07 ENCOUNTER — Ambulatory Visit (INDEPENDENT_AMBULATORY_CARE_PROVIDER_SITE_OTHER): Payer: Medicaid Other | Admitting: Pharmacist

## 2022-03-07 ENCOUNTER — Other Ambulatory Visit: Payer: Self-pay

## 2022-03-07 DIAGNOSIS — B182 Chronic viral hepatitis C: Secondary | ICD-10-CM | POA: Diagnosis not present

## 2022-03-07 NOTE — Progress Notes (Signed)
HPI: Luke [name] is a 40 y.o. male who presents to the [place] clinic for Hepatitis C follow-up.  Medication: Mavyret x 8 weeks  Start Date: 01/01/22  Hepatitis C Genotype: 1a  Fibrosis Score: F0  Hepatitis C RNA: 5.17 million > undetectable on 02/02/22  Patient Active Problem List   Diagnosis Date Noted   Syncope 09/19/2021   High anion gap metabolic acidosis 62/70/3500   Diarrhea 09/19/2021   Uncontrolled type 1 diabetes mellitus with hyperglycemia, with long-term current use of insulin ([place]) 09/19/2021   Moderate episode of recurrent major depressive disorder ([place]) 05/02/2021   Chronic kidney disease, stage IV (severe) ([place]) 05/02/2021   Substance induced mood disorder ([place]) 01/21/2021   Opioid use disorder, moderate, in early remission, on maintenance therapy, dependence ([place]) 01/21/2021   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   Congestive heart failure (CHF) ([place]) 08/27/2019   Chest pain 08/20/2019   Elevated troponin 08/20/2019   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   Heroin abuse ([place]) 05/09/2019   Hypothyroidism    Hepatitis C 11/25/2017   GERD (gastroesophageal reflux disease) 07/21/2017   Diabetic polyneuropathy associated with type 1 diabetes mellitus ([place])    Noncompliant bladder    Urinary retention    ARF (acute renal failure) ([place]) 12/16/2016   Anemia 12/16/2016   Cocaine abuse ([place]) 08/16/2015   Type 1 diabetes mellitus without complication ([place]) 93/81/8299   Cellulitis 12/03/2014   AKI (acute kidney injury) ([place]) 12/03/2014   Elevated liver enzymes 07/26/2014   CKD (chronic kidney disease) stage 3, GFR 30-59 ml/min ([name]) 10/14/2012   Leukocytosis 10/14/2012   Polysubstance abuse ([place]) 10/11/2012   Tobacco abuse 10/11/2012   Essential hypertension 10/11/2012    Patient's Medications  New Prescriptions   No medications on file  Previous Medications   ACCU-CHEK SOFTCLIX LANCETS LANCETS    use 1 lancet to check blood glucose 5 times  per day   AMLODIPINE (NORVASC) 10 MG TABLET    Take 0.5 tablets (5 mg total) by mouth daily.   BLOOD GLUCOSE MONITORING SUPPL (ACCU-CHEK GUIDE ME) W/DEVICE KIT    Use to check blood sugar TID. E11.65   CONTINUOUS BLOOD GLUC RECEIVER (FREESTYLE LIBRE 14 DAY READER) DEVI    Use as instructed. Check blood glucose level by fingerstick 5 times per day. E11.65 Z79.4   CONTINUOUS BLOOD GLUC RECEIVER (FREESTYLE LIBRE 2 READER) DEVI    Use as instructed. Check blood glucose level by fingerstick 5 times per day. E11.65 Z79.4   CONTINUOUS BLOOD GLUC SENSOR (FREESTYLE LIBRE 2 SENSOR) MISC    Use as instructed. Check blood glucose level 5 times per day.   GLUCOSE BLOOD (ACCU-CHEK GUIDE) TEST STRIP    Use as directed to check blood glucose 5 times per day   INSULIN ASPART (NOVOLOG FLEXPEN) 100 UNIT/ML FLEXPEN    Inject 2-10 Units into the skin 3 (three) times daily with meals. Glucose 151-200 take 2 units, glucose 201-250 take 4 units, glucose 251-300 take 6 units, glucose 301-350 take 8 units, glucose 351-400 take 10 units   INSULIN ASPART PROTAMINE - ASPART (NOVOLOG MIX 70/30 FLEXPEN) (70-30) 100 UNIT/ML FLEXPEN    Inject 45 Units into the skin 2 (two) times daily with a meal.   INSULIN PEN NEEDLE (TRUEPLUS 5-BEVEL PEN NEEDLES) 32G X 4 MM MISC    Use as instructed to inject insulin.   LISINOPRIL (ZESTRIL) 5 MG TABLET    Take 1 tablet (5 mg total) by mouth daily.  SILDENAFIL (VIAGRA) 100 MG TABLET    Take 0.5-1 tablets (50-100 mg total) by mouth daily as needed for erectile dysfunction.  Modified Medications   No medications on file  Discontinued Medications   GLECAPREVIR-PIBRENTASVIR (MAVYRET) 100-40 MG TABS    Take 3 tablets by mouth daily with breakfast.    Allergies: No Known Allergies  Past Medical History: Past Medical History:  Diagnosis Date   Anemia    Anxiety  Dx 05/16/2006   Cellulitis    Chronic kidney disease    Diabetes mellitus without complication ([place]) Dx 3335   DKA (diabetic  ketoacidoses) 07/21/2017   Endocarditis 2019-05-17   Endocarditis    GERD (gastroesophageal reflux disease) Dx 16-May-2006   Headache(784.0)    Heart murmur    Hepatitis C    Hypertension    Hypothyroidism    MRSA infection    Necrotizing fasciitis of multiple sites ([place])    Pneumonia    Seizures ([place]) 05/16/2009   x 2 in lifetime. on Dilantin for a while.    Sepsis ([place])    Substance abuse ([place]) 05/17/11   heroin use, multiple relapses   Type 1 diabetes ([place])     Social History: Social History   Socioeconomic History   Marital status: Married    Spouse name: Not on file   Number of children: 6   Years of education: some colle   Highest education level: Not on file  Occupational History   Occupation: Event set up    Employer: [place]    Occupation: Stencil for cars   Tobacco Use   Smoking status: Every Day    Packs/day: 1.00    Years: 25.00    Total pack years: 25.00    Types: Cigarettes   Smokeless tobacco: Never   Tobacco comments:    decreased over the last few weeks because he has not been breathing well  Vaping Use   Vaping Use: Some days  Substance and Sexual Activity   Alcohol use: No    Alcohol/week: 0.0 standard drinks of alcohol    Comment: rarely   Drug use: Not Currently    Types: Cocaine, IV, Heroin    Comment: IVDU since Dad died in 16-May-2006 with 15 month period of sobriety 2018-2019, relapse Aug 2019.   Sexual activity: Yes    Partners: Female    Birth control/protection: None  Other Topics Concern   Not on file  Social History Narrative   Lives with wife and two children age 29 and 63.          Social Determinants of Health   Financial Resource Strain: Low Risk  (11/01/2021)   Overall Financial Resource Strain (CARDIA)    Difficulty of Paying Living Expenses: Not very hard  Food Insecurity: No Food Insecurity (11/01/2021)   Hunger Vital Sign    Worried About Running Out of Food in the Last Year: Never true    Ran Out of Food in  the Last Year: Never true  Transportation Needs: No Transportation Needs (11/01/2021)   PRAPARE - Hydrologist (Medical): No    Lack of Transportation (Non-Medical): No  Physical Activity: Not on file  Stress: Not on file  Social Connections: Moderately Isolated (09/10/2019)   Social Connection and Isolation Panel [NHANES]    Frequency of Communication with Friends and Family: More than three times a week    Frequency of Social Gatherings with Friends and Family: More  than three times a week    Attends Religious Services: Never    Active Member of Clubs or Organizations: No    Attends Archivist Meetings: Not on file    Marital Status: Married    Labs: Hepatitis C Lab Results  Component Value Date   HCVGENOTYPE 1a 12/19/2021   HCVRNAPCRQN <15 02/02/2022   FIBROSTAGE F0 12/19/2021   Hepatitis B Lab Results  Component Value Date   HEPBSAB NON-REACTIVE 12/19/2021   HEPBSAG NON-REACTIVE 12/19/2021   Hepatitis A Lab Results  Component Value Date   HAV Negative 11/29/2017   HIV Lab Results  Component Value Date   HIV Non Reactive 10/02/2020   HIV Non Reactive 08/27/2019   HIV NON REACTIVE 03/19/2019   HIV Non Reactive 07/21/2017   HIV Non Reactive 12/17/2016   Lab Results  Component Value Date   CREATININE 1.44 (H) 12/25/2021   CREATININE 1.48 (H) 10/23/2021   CREATININE 1.41 (H) 09/20/2021   CREATININE 1.77 (H) 09/19/2021   CREATININE 1.71 (H) 09/19/2021   Lab Results  Component Value Date   AST 35 10/23/2021   AST 41 09/20/2021   AST 23 01/20/2021   ALT 46 12/19/2021   ALT 42 10/23/2021   ALT 42 09/20/2021   INR 1.0 08/19/2019   INR 0.9 05/19/2019   INR 1.0 05/08/2019    Assessment: Luke [name] presents to clinic today for HCV end of treatment follow-up. He completed 8 weeks of Mavyret without missing doses or adverse effects. Will check HCV RNA again today; was already undetectable at his last visit. Will follow up with  Colletta [place] in 3 months for test of cure. Luke [name] is nonimmune to hepatitis A and B but declined vaccination today; he also declined annual flu vaccine.   Plan: Check HCV RNA Follow up with Colletta [place] for SVR on 3/14 at 3:30pm  Alfonse Spruce, PharmD, CPP, BCIDP, [place] Clinical Pharmacist Practitioner Infectious Diseases [place] for Infectious Disease 03/07/2022, 9:31 AM

## 2022-03-07 NOTE — Addendum Note (Signed)
Addended by: Caffie Pinto on: 03/07/2022 12:03 PM   Modules accepted: Orders

## 2022-03-13 ENCOUNTER — Other Ambulatory Visit: Payer: Self-pay

## 2022-03-13 ENCOUNTER — Other Ambulatory Visit: Payer: Self-pay | Admitting: Nurse Practitioner

## 2022-03-14 ENCOUNTER — Other Ambulatory Visit: Payer: Self-pay

## 2022-03-14 ENCOUNTER — Other Ambulatory Visit: Payer: Medicaid Other

## 2022-03-14 DIAGNOSIS — B182 Chronic viral hepatitis C: Secondary | ICD-10-CM

## 2022-03-14 MED ORDER — ACCU-CHEK GUIDE W/DEVICE KIT
PACK | Freq: Three times a day (TID) | 0 refills | Status: AC
  Filled 2022-03-14 (×4): qty 1, 30d supply, fill #0

## 2022-03-17 LAB — HEPATITIS C RNA QUANTITATIVE
HCV Quantitative Log: <1.18 log IU/mL
HCV RNA, PCR, QN: <15 IU/mL

## 2022-03-20 ENCOUNTER — Ambulatory Visit: Payer: Medicaid Other | Admitting: Nurse Practitioner

## 2022-03-20 ENCOUNTER — Other Ambulatory Visit: Payer: Self-pay

## 2022-04-09 ENCOUNTER — Encounter: Payer: Self-pay | Admitting: Nurse Practitioner

## 2022-04-26 ENCOUNTER — Other Ambulatory Visit: Payer: Self-pay

## 2022-05-01 ENCOUNTER — Telehealth (HOSPITAL_COMMUNITY): Payer: Self-pay | Admitting: Licensed Clinical Social Worker

## 2022-05-01 NOTE — Telephone Encounter (Signed)
The therapist attempts to reach [facility] regarding a referral received through the [facility] work queue leaving a HIPAA-compliant voicemail with his direct callback number.   Adam Phenix, [place], LCSW, [facility], [place] 05/01/2022

## 2022-05-15 ENCOUNTER — Ambulatory Visit (INDEPENDENT_AMBULATORY_CARE_PROVIDER_SITE_OTHER): Payer: Medicaid Other | Admitting: Podiatry

## 2022-05-15 DIAGNOSIS — Z91199 Patient's noncompliance with other medical treatment and regimen due to unspecified reason: Secondary | ICD-10-CM

## 2022-05-15 NOTE — Progress Notes (Signed)
Patient was no-show for appointment today 

## 2022-05-16 ENCOUNTER — Other Ambulatory Visit: Payer: Self-pay

## 2022-05-31 ENCOUNTER — Encounter: Payer: Self-pay | Admitting: Infectious Diseases

## 2022-05-31 ENCOUNTER — Other Ambulatory Visit: Payer: Self-pay

## 2022-05-31 ENCOUNTER — Ambulatory Visit: Payer: Medicaid Other | Admitting: Infectious Diseases

## 2022-05-31 VITALS — Wt 182.0 lb

## 2022-05-31 DIAGNOSIS — B182 Chronic viral hepatitis C: Secondary | ICD-10-CM

## 2022-05-31 NOTE — Patient Instructions (Signed)
Nice to see you!   Will let you what your blood work shows but I am hopeful you have been cured!  Good luck with your heart surgery - will be thinking about you

## 2022-05-31 NOTE — Progress Notes (Signed)
Patient Name: Luke [name]  Date of Birth: 1981/02/22  MRN: [id number]  PCP: Gildardo Pounds, NP  Referring Provider: Gildardo Pounds, NP, Ph#: [phone]    CC:  SVR visit for chronic hepatitis C infection.     HPI/ROS:  Luke [name] is a 42 y.o. male with chronic hepatitis C infection.   Medication: Mavyret x 8 weeks   Start Date: 01/01/22   Hepatitis C Genotype: 1a   Fibrosis Score: F0   Hepatitis C RNA: 5.17 million > undetectable on 02/02/22 and 03/07/22  Transmission risk was previously associated with intravenous drug use, for which was a longstanding problem for him well over a decade.  He has since moved several hours away and has 3 months completely clean now. Living near his children with his mother - his oldest daughter joins him today.   He is planned for double valve replacement of aortic and mitral valve in a little over a week.  All of his health care has been with [place] out of [place].  He has regained weight and feels like he is in better health since the last time I saw him.   Review of Systems  All other systems reviewed and are negative.  All other systems reviewed and are negative      Past Medical History:  Diagnosis Date   Anemia    Anxiety  Dx 2008   Cellulitis    Chronic kidney disease    Diabetes mellitus without complication ([place]) Dx [id number]   DKA (diabetic ketoacidoses) 07/21/2017   Endocarditis 04/2019   Endocarditis    GERD (gastroesophageal reflux disease) Dx 2008   Headache(784.0)    Heart murmur    Hepatitis C    Hypertension    Hypothyroidism    MRSA infection    Necrotizing fasciitis of multiple sites ([place])    Pneumonia    Seizures ([place]) 2011   x 2 in lifetime. on Dilantin for a while.    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 Authorizing Provider  Accu-Chek Softclix  Lancets lancets use 1 lancet to check blood glucose 5 times per day 05/01/21  Yes   amLODipine (NORVASC) 10 MG tablet Take 0.5 tablets (5 mg total) by mouth daily. 10/25/21  Yes Cleaver, Jossie Ng, NP  Blood Glucose Monitoring Suppl (ACCU-CHEK GUIDE ME) w/Device KIT Use to check blood sugar TID. E11.65 01/14/20  Yes Charlott Rakes, MD  Continuous Blood Gluc Receiver (FREESTYLE LIBRE 14 DAY READER) DEVI Use as instructed. Check blood glucose level by fingerstick 5 times per day. E11.65 Z79.4 01/27/21  Yes Gildardo Pounds, NP  Continuous Blood Gluc Receiver (FREESTYLE LIBRE 2 READER) DEVI Use as instructed. Check blood glucose level by fingerstick 5 times per day. E11.65 Z79.4 01/27/21  Yes Gildardo Pounds, NP  Continuous Blood Gluc Sensor (FREESTYLE LIBRE 2 SENSOR) MISC Use as instructed. Check blood glucose level 5 times per day. 10/23/21  Yes Gildardo Pounds, NP  glucose blood test strip Use as directed to check blood glucose 5 times per day 10/23/21  Yes Gildardo Pounds, NP  insulin aspart protamine - aspart (NOVOLOG MIX 70/30 FLEXPEN) (70-30) 100 UNIT/ML FlexPen Inject 45 Units into the skin 2 (two) times daily with a meal. 10/23/21  Yes Gildardo Pounds, NP  Insulin  Pen Needle (TRUEPLUS 5-BEVEL PEN NEEDLES) 32G X 4 MM MISC Use as instructed to inject insulin. 11/27/19  Yes Newlin, Charlane Ferretti, MD  lisinopril (ZESTRIL) 5 MG tablet Take 1 tablet (5 mg total) by mouth daily. 10/23/21  Yes Gildardo Pounds, NP  sildenafil (VIAGRA) 100 MG tablet Take 0.5-1 tablets (50-100 mg total) by mouth daily as needed for erectile dysfunction. 10/23/21  Yes Gildardo Pounds, NP  ketoconazole (NIZORAL) 2 % cream Apply 1 application topically daily. Patient not taking: Reported on 10/25/2021 05/15/21   Criselda Peaches, DPM  SUBOXONE 8-2 MG FILM Take 1 Film by mouth 3 (three) times daily. Patient not taking: Reported on 10/25/2021 07/14/20   [provider]    No Known Allergies  Social History   Tobacco Use   Smoking  status: Every Day    Packs/day: 1.00    Years: 25.00    Additional pack years: 0.00    Total pack years: 25.00    Types: Cigarettes   Smokeless tobacco: Never   Tobacco comments:    decreased over the last few weeks because he has not been breathing well  Vaping Use   Vaping Use: Some days  Substance Use Topics   Alcohol use: No    Alcohol/week: 0.0 standard drinks of alcohol    Comment: rarely   Drug use: Not Currently    Types: Cocaine, IV, Heroin    Comment: IVDU since Dad died in Jul 15, 2006 with 15 month period of sobriety 2018-2019, relapse Aug 2019.    Family History  Problem Relation Age of Onset   Diabetes Father    Heart disease Father    Mental illness Sister    Cancer Neg Hx     Objective:   There were no vitals filed for this visit.  Constitutional: in no apparent distress, well developed and well nourished, and oriented times 3 Eyes: anicteric Cardiovascular: Cor Tachy Respiratory: clear Gastrointestinal: Bowel sounds are normal, liver is not enlarged, spleen is not enlarged Musculoskeletal: peripheral pulses normal, no pedal edema, no clubbing or cyanosis Skin: negative for - jaundice, spider hemangioma, telangiectasia, palmar erythema, ecchymosis and atrophy; no porphyria cutanea tarda; cleaned ulcerative wound on the right forearm a months to heavily scarred skin from previous track marks and plastic surgery. Lymphatic: no cervical lymphadenopathy   Laboratory: Genotype:  Lab Results  Component Value Date   HCVGENOTYPE 1a 12/19/2021   HCV viral load:  Lab Results  Component Value Date   HCVQUANT 5,170,000 05/13/2019   Lab Results  Component Value Date   WBC 15.3 (H) 10/23/2021   HGB 14.2 10/23/2021   HCT 42.1 10/23/2021   MCV 89 10/23/2021   PLT 435 10/23/2021    Lab Results  Component Value Date   CREATININE 1.44 (H) 12/25/2021   BUN 16 12/25/2021   NA 141 12/25/2021   K 4.8 12/25/2021   CL 103 12/25/2021   CO2 23 12/25/2021    Lab  Results  Component Value Date   ALT 55 (H) 05/31/2022   AST 23 05/31/2022   GGT 80 12/19/2021   ALKPHOS 139 (H) 10/23/2021    Lab Results  Component Value Date   INR 1.0 08/19/2019   BILITOT 0.7 05/31/2022   ALBUMIN 4.6 10/23/2021    Imaging:    Assessment & Plan:   Problem List Items Addressed This Visit       Medium    Hepatitis C - Primary (Chronic)    Genotype Ia chronic hepatitis C  treated with Mavyret x 8 weeks.  Pretreatment fibrosis score F0.  Will check SVR today with hep C RNA.  Repeat LFTs. Explained that he has no permanent liver damage based off of pretreatment assessment.  Would continue to take good precautions to protect liver through decreased/eliminated alcohol use, staying healthy weight/abdominal circumference.  His antibody will always be positive no matter if he has been treated successfully or not.  Explained this indicates a memory of the infection exposure not the active infection itself. He can become reinfected should he resume intravenous drug use.  He has a good plan to keep himself in a controlled environment to avoid this.   I wished him luck at his upcoming double valve replacement.  We will contact him once results are back and let him know what else we need to do but I anticipate this will be his last visit in our clinic today.        Relevant Orders   Hepatitis C RNA quantitative ([facility])   Hepatic function panel (Completed)    [name], MSN, NP-C [place] [facility] [email or web address] Pager: [phone] Office: [place]: [phone]

## 2022-06-01 NOTE — Assessment & Plan Note (Signed)
Genotype Ia chronic hepatitis C treated with Mavyret x 8 weeks.  Pretreatment fibrosis score F0.  Will check SVR today with hep C RNA.  Repeat LFTs. Explained that he has no permanent liver damage based off of pretreatment assessment.  Would continue to take good precautions to protect liver through decreased/eliminated alcohol use, staying healthy weight/abdominal circumference.  His antibody will always be positive no matter if he has been treated successfully or not.  Explained this indicates a memory of the infection exposure not the active infection itself. He can become reinfected should he resume intravenous drug use.  He has a good plan to keep himself in a controlled environment to avoid this.   I wished him luck at his upcoming double valve replacement.  We will contact him once results are back and let him know what else we need to do but I anticipate this will be his last visit in our clinic today.

## 2022-06-02 LAB — HEPATIC FUNCTION PANEL
AG Ratio: 1.5 (calc) (ref 1.0–2.5)
ALT: 55 U/L — ABNORMAL HIGH (ref 9–46)
AST: 23 U/L (ref 10–40)
Albumin: 3.8 g/dL (ref 3.6–5.1)
Alkaline phosphatase (APISO): 154 U/L — ABNORMAL HIGH (ref 36–130)
Bilirubin, Direct: 0.1 mg/dL (ref 0.0–0.2)
Globulin: 2.5 g/dL (calc) (ref 1.9–3.7)
Indirect Bilirubin: 0.6 mg/dL (calc) (ref 0.2–1.2)
Total Bilirubin: 0.7 mg/dL (ref 0.2–1.2)
Total Protein: 6.3 g/dL (ref 6.1–8.1)

## 2022-06-02 LAB — HEPATITIS C RNA QUANTITATIVE
HCV Quantitative Log: <1.18 log IU/mL
HCV RNA, PCR, QN: <15 IU/mL

## 2022-08-03 ENCOUNTER — Ambulatory Visit: Payer: Medicaid Other | Admitting: Internal Medicine

## 2022-08-03 NOTE — Progress Notes (Deleted)
Name: Luke [name]  MRN/ DOB: [id number], [birth year]   Age/ Sex: 42 y.o., male    PCP: Claiborne Rigg, NP   Reason for Endocrinology Evaluation: Type 1 Diabetes Mellitus     Date of Initial Endocrinology Visit: 08/03/2022     PATIENT IDENTIFIER: Luke [name] is a 42 y.o. male with a past medical history of Dm, S/P AVR, chronic hep C. The patient presented for initial endocrinology clinic visit on 08/03/2022 for consultative assistance with his diabetes management.    HPI: Luke [name] was    Diagnosed with DM *** Prior Medications tried/Intolerance: *** Currently checking blood sugars *** x / day,  before breakfast and ***.  Hypoglycemia episodes : ***               Symptoms: ***                 Frequency: ***/  Hemoglobin A1c has ranged from 8.3% in 2022, peaking at >15.0% in 2020. Patient has required hospitalization within the last 1 year from hyper or hypoglycemia: DKA 03/2022   In terms of diet, the patient ***  Patient has had multiple ED visits for variable reasons including DKA, acute hypoxic respiratory failure, suicide attempt  HOME DIABETES REGIMEN: Basal: ***  Bolus: ***   Statin: {Yes/No:11203} ACE-I/ARB: {YES/NO:17245} Prior Diabetic Education: {Yes/No:11203}   METER DOWNLOAD SUMMARY: Date range evaluated: *** Fingerstick Blood Glucose Tests = *** Average Number Tests/Day = *** Overall Mean FS Glucose = *** Standard Deviation = ***  BG Ranges: Low = *** High = ***   Hypoglycemic Events/30 Days: BG < 50 = *** Episodes of symptomatic severe hypoglycemia = ***   DIABETIC COMPLICATIONS: Microvascular complications:  *** Denies: *** Last eye exam: Completed   Macrovascular complications:  *** Denies: CAD, PVD, CVA   PAST HISTORY: Past Medical History:  Past Medical History:  Diagnosis Date   Anemia    Anxiety  Dx 2008   Cellulitis    Chronic kidney disease    Diabetes mellitus without complication ([name]) Dx 2008   DKA (diabetic  ketoacidoses) 07/21/2017   Endocarditis 04/2019   Endocarditis    GERD (gastroesophageal reflux disease) Dx 2008   Headache(784.0)    Heart murmur    Hepatitis C    Hypertension    Hypothyroidism    MRSA infection    Necrotizing fasciitis of multiple sites ([name])    Pneumonia    Seizures ([name]) 2011   x 2 in lifetime. on Dilantin for a while.    Sepsis ([name])    Substance abuse ([name]) 2013   heroin use, multiple relapses   Type 1 diabetes [facility])    Past Surgical History:  Past Surgical History:  Procedure Laterality Date   AMPUTATION Left 09/05/2018   Procedure: LEFT GREAT TOE AMPUTATION;  Surgeon: Nadara Mustard, MD;  Location: [facility] OR;  Service: Orthopedics;  Laterality: Left;   APPLICATION OF A-CELL OF EXTREMITY Bilateral 09/01/2019   Procedure: APPLICATION OF MESHED PRIMATRIX AG OF EXTREMITY;  Surgeon: Allena Napoleon, MD;  Location: [facility] OR;  Service: Plastics;  Laterality: Bilateral;   I & D EXTREMITY Left 10/11/2012   Procedure: IRRIGATION AND DEBRIDEMENT ABSCESS FOREARM;  Surgeon: Sharma Covert, MD;  Location: [facility] OR;  Service: Orthopedics;  Laterality: Left;   I & D EXTREMITY Left 10/12/2012   Procedure: IRRIGATION AND DEBRIDEMENT FOREARM;  Surgeon: Sharma Covert, MD;  Location: [facility] OR;  Service: Orthopedics;  Laterality: Left;  I & D EXTREMITY Left 10/14/2012   Procedure: incision and drainage left forearm;  Surgeon: Sharma Covert, MD;  Location: [facility] OR;  Service: Orthopedics;  Laterality: Left;   I & D EXTREMITY Left 10/16/2012   Procedure: IRRIGATION AND DEBRIDEMENT LEFT FOREARM;  Surgeon: Sharma Covert, MD;  Location: [facility] OR;  Service: Orthopedics;  Laterality: Left;   I & D EXTREMITY Left 10/20/2012   Procedure: INCISION AND DRAINAGE AND DEBRIDEMENT LEFT  FOREARM;  Surgeon: Sharma Covert, MD;  Location: [facility] OR;  Service: Orthopedics;  Laterality: Left;   I & D EXTREMITY Right 05/22/2019   Procedure: IRRIGATION AND DEBRIDEMENT RIGHT ARM;  Surgeon: Nadara Mustard, MD;  Location: [facility] OR;   Service: Orthopedics;  Laterality: Right;   INCISION AND DRAINAGE OF WOUND Bilateral 11/29/2017   Procedure: DEBRIDEMENT BILATERAL FEET, DEBRIDEMENT LEFT ANKLE, AND APPLY WOUND VAC;  Surgeon: Nadara Mustard, MD;  Location: [facility] OR;  Service: Orthopedics;  Laterality: Bilateral;   INCISION AND DRAINAGE OF WOUND Bilateral 09/01/2019   Procedure: IRRIGATION AND DEBRIDEMENT FOREARM WOUNDS;  Surgeon: Allena Napoleon, MD;  Location: [facility] OR;  Service: Plastics;  Laterality: Bilateral;   IRRIGATION AND DEBRIDEMENT ABSCESS     Hx: of left arm abscess related to drug use    TEE WITHOUT CARDIOVERSION N/A 11/28/2017   Procedure: TRANSESOPHAGEAL ECHOCARDIOGRAM (TEE);  Surgeon: Thurmon Fair, MD;  Location: [facility] ENDOSCOPY;  Service: Cardiovascular;  Laterality: N/A;   TEE WITHOUT CARDIOVERSION N/A 05/12/2019   Procedure: TRANSESOPHAGEAL ECHOCARDIOGRAM (TEE);  Surgeon: Chilton Si, MD;  Location: [facility] ENDOSCOPY;  Service: Cardiovascular;  Laterality: N/A;    Social History:  reports that he has been smoking cigarettes. He has a 25.00 pack-year smoking history. He has never used smokeless tobacco. He reports that he does not currently use drugs after having used the following drugs: Cocaine, IV, and Heroin. He reports that he does not drink alcohol. Family History:  Family History  Problem Relation Age of Onset   Diabetes Father    Heart disease Father    Mental illness Sister    Cancer Neg Hx      HOME MEDICATIONS: Allergies as of 08/03/2022   No Known Allergies      Medication List        Accurate as of Aug 03, 2022 12:22 PM. If you have any questions, ask your nurse or doctor.          Accu-Chek Guide Me w/Device Kit Use to check blood sugar TID. E11.65   Accu-Chek Guide w/Device Kit USE TO CHECK BLOOD SUGAR THREE TIMES DAILY   Accu-Chek Guide test strip Generic drug: glucose blood Use as directed to check blood glucose 5 times per day   Accu-Chek Softclix Lancets lancets use 1  lancet to check blood glucose 5 times per day   amLODipine 10 MG tablet Commonly known as: NORVASC Take 0.5 tablets (5 mg total) by mouth daily.   FreeStyle Libre 14 Day Reader Hardie Pulley Use as instructed. Check blood glucose level by fingerstick 5 times per day. E11.65 Z79.4   FreeStyle Libre 2 Reader [place] Use as instructed. Check blood glucose level by fingerstick 5 times per day. E11.65 Z79.4   FreeStyle Libre 2 [employer] Use as instructed. Check blood glucose level 5 times per day.   lisinopril 5 MG tablet Commonly known as: ZESTRIL Take 1 tablet (5 mg total) by mouth daily.   NovoLOG FlexPen 100 UNIT/ML FlexPen Generic drug: insulin aspart Inject 2-10 Units  into the skin 3 (three) times daily with meals. Glucose 151-200 take 2 units, glucose 201-250 take 4 units, glucose 251-300 take 6 units, glucose 301-350 take 8 units, glucose 351-400 take 10 units   NovoLOG Mix 70/30 FlexPen (70-30) 100 UNIT/ML FlexPen Generic drug: insulin aspart protamine - aspart Inject 45 Units into the skin 2 (two) times daily with a meal.   sildenafil 100 MG tablet Commonly known as: Viagra Take 0.5-1 tablets (50-100 mg total) by mouth daily as needed for erectile dysfunction.   TRUEplus 5-Bevel Pen Needles 32G X 4 MM Misc Generic drug: Insulin Pen Needle Use as instructed to inject insulin.         ALLERGIES: No Known Allergies   REVIEW OF SYSTEMS: A comprehensive ROS was conducted with the patient and is negative except as per HPI and below:  ROS    OBJECTIVE:   VITAL SIGNS: There were no vitals taken for this visit.   PHYSICAL EXAM:  General: Pt appears well and is in NAD  Neck: General: Supple without adenopathy or carotid bruits. Thyroid: Thyroid size normal.  No goiter or nodules appreciated.   Lungs: Clear with good BS bilat   Heart: RRR   Abdomen:  soft, nontender  Extremities:  Lower extremities - No pretibial edema.   Skin:  No rash noted.  Neuro: MS is good with  appropriate affect, pt is alert and Ox3    DM foot exam:    DATA REVIEWED:  Lab Results  Component Value Date   HGBA1C 11.1 (H) 09/19/2021   HGBA1C 12.1 (A) 05/01/2021   HGBA1C 8.3 (H) 01/21/2021   Lab Results  Component Value Date   MICROALBUR 0.5 12/10/2013   LDLCALC 157 (H) 03/04/2020   CREATININE 1.44 (H) 12/25/2021   Lab Results  Component Value Date   MICRALBCREAT 798 (H) 03/04/2020    Lab Results  Component Value Date   CHOL 244 (H) 03/04/2020   HDL 60 03/04/2020   LDLCALC 157 (H) 03/04/2020   TRIG 134 10/04/2020   CHOLHDL 4.1 03/04/2020        ASSESSMENT / PLAN / RECOMMENDATIONS:   1) Type *** Diabetes Mellitus, ***controlled, With*** complications - Most recent A1c of *** %. Goal A1c < *** %.  ***  Plan: GENERAL: ***  MEDICATIONS: ***  EDUCATION / INSTRUCTIONS: BG monitoring instructions: Patient is instructed to check his blood sugars *** times a day, ***. Call [place] [facility] if: BG persistently < 70  I reviewed the Rule of 15 for the treatment of hypoglycemia in detail with the patient. Literature supplied.   2) Diabetic complications:  Eye: Does *** have known diabetic retinopathy.  Neuro/ Feet: Does *** have known diabetic peripheral neuropathy. Renal: Patient does *** have known baseline CKD. He is *** on an ACEI/ARB at present.  3) Lipids: Patient is *** on a statin.    4) Hypertension: ***  at goal of < 140/90 mmHg.       Signed electronically by: Lyndle Herrlich, MD  [facility] Endocrinology  [facility] [address] [place], [place] [address] Phone: [phone] FAX: [phone]   CC: Claiborne Rigg, NP [address] [place] [address] [place] [address] Phone: [phone]  Fax: [phone]    Return to Endocrinology clinic as below: Future Appointments  Date Time Provider Department Center  08/03/2022  2:00 PM Camree Wigington, Konrad Dolores, MD [id number] None

## 2023-01-09 ENCOUNTER — Telehealth: Payer: Self-pay

## 2023-01-09 NOTE — Transitions of Care (Post Inpatient/ED Visit) (Signed)
01/09/2023  Name: Luke [name] MRN: [id number] DOB: April 30, 1980  Today's TOC FU Call Status: Today's TOC FU Call Status:: Successful TOC FU Call Completed TOC FU Call Complete Date: 01/09/23 Patient's Name and Date of Birth confirmed.  Transition Care Management Follow-up Telephone Call Date of Discharge: 01/07/23 Discharge Facility: Other [facility]) Name of Other ([facility]) Discharge Facility: [facility] Type of Discharge: Inpatient Admission Primary Inpatient Discharge Diagnosis:: Diabetic Ketoacidosis, Acute metabolic encephalopathy, AKI superimposed on CKD 3a, septic shock How have you been since you were released from the hospital?: Better Any questions or concerns?: No  Items Reviewed: Did you receive and understand the discharge instructions provided?: Yes Medications obtained,verified, and reconciled?: Yes (Medications Reviewed) Any new allergies since your discharge?: No Dietary orders reviewed?: Yes Type of Diet Ordered:: Carb modified (60g Carbohydrate) Diabetic diet, Heart healthy Do you have support at home?: Yes People in Home: spouse  Medications Reviewed Today: Medications Reviewed Today     Reviewed by Marcos Eke, RN (Registered Nurse) on 01/09/23 at 1738  Med List Status: <None>   Medication Order Taking? Sig Documenting Provider Last Dose Status Informant  Accu-Chek Softclix Lancets lancets [id number] No use 1 lancet to check blood glucose 5 times per day  Taking Active Self  amLODipine (NORVASC) 10 MG tablet [id number] No Take 0.5 tablets (5 mg total) by mouth daily. Ronney Asters, NP Taking Active   Blood Glucose Monitoring Suppl (ACCU-CHEK GUIDE ME) w/Device KIT [id number] No Use to check blood sugar TID. E11.65 Hoy Register, MD Taking Active Self  Blood Glucose Monitoring Suppl (ACCU-CHEK GUIDE) w/Device KIT [id number]  USE TO CHECK BLOOD SUGAR THREE TIMES DAILY Hoy Register, MD  Active   Continuous Blood Gluc  Receiver (FREESTYLE LIBRE 14 DAY READER) DEVI [id number] No Use as instructed. Check blood glucose level by fingerstick 5 times per day. E11.65 Z79.4 Claiborne Rigg, NP Taking Active Self  Continuous Blood Gluc Receiver (FREESTYLE LIBRE 2 READER) DEVI [id number] No Use as instructed. Check blood glucose level by fingerstick 5 times per day. E11.65 Z79.4 Claiborne Rigg, NP Taking Active Self  Continuous Blood Gluc Sensor (FREESTYLE LIBRE 2 SENSOR) MISC [id number] No Use as instructed. Check blood glucose level 5 times per day. Claiborne Rigg, NP Taking Active   glucose blood (ACCU-CHEK GUIDE) test strip [id number]  Use as directed to check blood glucose 5 times per day Claiborne Rigg, NP  Active   insulin aspart (NOVOLOG FLEXPEN) 100 UNIT/ML FlexPen [id number]  Inject 2-10 Units into the skin 3 (three) times daily with meals. Glucose 151-200 take 2 units, glucose 201-250 take 4 units, glucose 251-300 take 6 units, glucose 301-350 take 8 units, glucose 351-400 take 10 units Claiborne Rigg, NP  Expired 05/26/22 2359   insulin aspart protamine - aspart (NOVOLOG MIX 70/30 FLEXPEN) (70-30) 100 UNIT/ML FlexPen [id number] No Inject 45 Units into the skin 2 (two) times daily with a meal. Claiborne Rigg, NP Taking Active   Insulin Pen Needle (TRUEPLUS 5-BEVEL PEN NEEDLES) 32G X 4 MM MISC [id number] No Use as instructed to inject insulin. Hoy Register, MD Taking Active Self  lisinopril (ZESTRIL) 5 MG tablet [id number] No Take 1 tablet (5 mg total) by mouth daily. Claiborne Rigg, NP Taking Active   sildenafil (VIAGRA) 100 MG tablet [id number] No Take 0.5-1 tablets (50-100 mg total) by mouth daily as needed for erectile dysfunction. Claiborne Rigg, NP Taking Active  Home Care and Equipment/Supplies: Were Home Health Services Ordered?: No Any new equipment or medical supplies ordered?: No  Functional Questionnaire: Do you need assistance with bathing/showering or dressing?: No Do you  need assistance with meal preparation?: No Do you need assistance with eating?: No Do you have difficulty maintaining continence: No Do you need assistance with getting out of bed/getting out of a chair/moving?: No Do you have difficulty managing or taking your medications?: No  Follow up appointments reviewed: PCP Follow-up appointment confirmed?: Yes (Patient states he has all his follow up appointments arranged including f/u with PCP) Date of PCP follow-up appointment?:  (patient states he has follow up with PCP) Follow-up Provider: ? change in PCPs to Dr. Rondel Baton, Internal medicine @ [facility] Follow-up appointment confirmed?: Yes Date of Specialist follow-up appointment?: 01/29/23 Follow-Up Specialty Provider:: Endocrinologist Dr. Ninfa Linden in [facility]  11/12 @ 1230p; also has f/u appt with Nephrologist Dr. Rivka Spring 02/26/23 in [place] Do you need transportation to your follow-up appointment?: No Do you understand care options if your condition(s) worsen?: Yes-patient verbalized understanding    [name], RN, BA, [facility], CRRN [facility] Population Health Care Management Coordinator, Transition of Care Ph # [phone]

## 2023-01-18 DEATH — deceased
